# Patient Record
Sex: Male | Born: 1940
Health system: Southern US, Community
[De-identification: ages and names within clinical notes are randomized; demographics above are authoritative.]

## PROBLEM LIST (undated history)

## (undated) DIAGNOSIS — G4730 Sleep apnea, unspecified: Secondary | ICD-10-CM

## (undated) DIAGNOSIS — M1990 Unspecified osteoarthritis, unspecified site: Secondary | ICD-10-CM

## (undated) DIAGNOSIS — Z955 Presence of coronary angioplasty implant and graft: Secondary | ICD-10-CM

## (undated) DIAGNOSIS — F4321 Adjustment disorder with depressed mood: Secondary | ICD-10-CM

## (undated) DIAGNOSIS — E119 Type 2 diabetes mellitus without complications: Secondary | ICD-10-CM

## (undated) DIAGNOSIS — I509 Heart failure, unspecified: Secondary | ICD-10-CM

## (undated) DIAGNOSIS — I2510 Atherosclerotic heart disease of native coronary artery without angina pectoris: Secondary | ICD-10-CM

## (undated) DIAGNOSIS — I4891 Unspecified atrial fibrillation: Secondary | ICD-10-CM

## (undated) DIAGNOSIS — F17200 Nicotine dependence, unspecified, uncomplicated: Secondary | ICD-10-CM

## (undated) DIAGNOSIS — E785 Hyperlipidemia, unspecified: Secondary | ICD-10-CM

## (undated) DIAGNOSIS — T8859XA Other complications of anesthesia, initial encounter: Secondary | ICD-10-CM

## (undated) DIAGNOSIS — J449 Chronic obstructive pulmonary disease, unspecified: Secondary | ICD-10-CM

## (undated) DIAGNOSIS — I10 Essential (primary) hypertension: Secondary | ICD-10-CM

## (undated) DIAGNOSIS — T4145XA Adverse effect of unspecified anesthetic, initial encounter: Secondary | ICD-10-CM

## (undated) DIAGNOSIS — G4733 Obstructive sleep apnea (adult) (pediatric): Secondary | ICD-10-CM

## (undated) DIAGNOSIS — Z952 Presence of prosthetic heart valve: Secondary | ICD-10-CM

## (undated) DIAGNOSIS — F0390 Unspecified dementia without behavioral disturbance: Secondary | ICD-10-CM

## (undated) DIAGNOSIS — K219 Gastro-esophageal reflux disease without esophagitis: Secondary | ICD-10-CM

## (undated) HISTORY — PX: CORONARY ANGIOPLASTY WITH STENT PLACEMENT: SHX49

## (undated) HISTORY — DX: Atherosclerotic heart disease of native coronary artery without angina pectoris: I25.10

## (undated) HISTORY — DX: Heart failure, unspecified: I50.9

## (undated) HISTORY — DX: Nicotine dependence, unspecified, uncomplicated: F17.200

## (undated) HISTORY — DX: Gastro-esophageal reflux disease without esophagitis: K21.9

## (undated) HISTORY — DX: Sleep apnea, unspecified: G47.30

## (undated) HISTORY — DX: Essential (primary) hypertension: I10

## (undated) HISTORY — DX: Hyperlipidemia, unspecified: E78.5

## (undated) HISTORY — DX: Presence of prosthetic heart valve: Z95.2

## (undated) HISTORY — DX: Unspecified atrial fibrillation: I48.91

## (undated) HISTORY — DX: Obstructive sleep apnea (adult) (pediatric): G47.33

---

## 1998-04-05 ENCOUNTER — Encounter: Admission: RE | Admit: 1998-04-05 | Discharge: 1998-04-05 | Payer: Self-pay | Admitting: Family Medicine

## 1998-10-16 ENCOUNTER — Encounter: Admission: RE | Admit: 1998-10-16 | Discharge: 1998-10-16 | Payer: Self-pay | Admitting: Family Medicine

## 1998-11-04 ENCOUNTER — Encounter: Admission: RE | Admit: 1998-11-04 | Discharge: 1998-11-04 | Payer: Self-pay | Admitting: Family Medicine

## 1999-03-14 ENCOUNTER — Encounter: Admission: RE | Admit: 1999-03-14 | Discharge: 1999-03-14 | Payer: Self-pay | Admitting: Family Medicine

## 2000-03-29 ENCOUNTER — Encounter: Admission: RE | Admit: 2000-03-29 | Discharge: 2000-03-29 | Payer: Self-pay | Admitting: Sports Medicine

## 2000-11-16 ENCOUNTER — Encounter: Payer: Self-pay | Admitting: Emergency Medicine

## 2000-11-16 ENCOUNTER — Emergency Department (HOSPITAL_COMMUNITY): Admission: EM | Admit: 2000-11-16 | Discharge: 2000-11-16 | Payer: Self-pay | Admitting: Emergency Medicine

## 2001-03-30 ENCOUNTER — Encounter: Admission: RE | Admit: 2001-03-30 | Discharge: 2001-03-30 | Payer: Self-pay | Admitting: Family Medicine

## 2001-09-01 ENCOUNTER — Encounter: Admission: RE | Admit: 2001-09-01 | Discharge: 2001-09-01 | Payer: Self-pay | Admitting: Family Medicine

## 2002-03-14 ENCOUNTER — Encounter: Admission: RE | Admit: 2002-03-14 | Discharge: 2002-03-14 | Payer: Self-pay | Admitting: Family Medicine

## 2004-08-22 ENCOUNTER — Emergency Department (HOSPITAL_COMMUNITY): Admission: EM | Admit: 2004-08-22 | Discharge: 2004-08-23 | Payer: Self-pay | Admitting: Emergency Medicine

## 2004-08-26 ENCOUNTER — Ambulatory Visit: Payer: Self-pay | Admitting: Family Medicine

## 2004-10-09 ENCOUNTER — Ambulatory Visit: Payer: Self-pay | Admitting: Family Medicine

## 2004-10-09 ENCOUNTER — Ambulatory Visit (HOSPITAL_COMMUNITY): Admission: RE | Admit: 2004-10-09 | Discharge: 2004-10-09 | Payer: Self-pay | Admitting: Family Medicine

## 2004-10-16 ENCOUNTER — Ambulatory Visit (HOSPITAL_COMMUNITY): Admission: RE | Admit: 2004-10-16 | Discharge: 2004-10-16 | Payer: Self-pay | Admitting: Sports Medicine

## 2004-10-16 ENCOUNTER — Encounter (INDEPENDENT_AMBULATORY_CARE_PROVIDER_SITE_OTHER): Payer: Self-pay | Admitting: *Deleted

## 2004-10-23 ENCOUNTER — Ambulatory Visit: Payer: Self-pay | Admitting: Family Medicine

## 2005-02-19 ENCOUNTER — Ambulatory Visit: Payer: Self-pay | Admitting: Family Medicine

## 2005-03-03 ENCOUNTER — Ambulatory Visit: Payer: Self-pay | Admitting: Sports Medicine

## 2005-03-09 ENCOUNTER — Ambulatory Visit: Payer: Self-pay | Admitting: Family Medicine

## 2005-03-29 ENCOUNTER — Inpatient Hospital Stay (HOSPITAL_COMMUNITY): Admission: EM | Admit: 2005-03-29 | Discharge: 2005-04-01 | Payer: Self-pay | Admitting: Emergency Medicine

## 2005-03-29 ENCOUNTER — Ambulatory Visit: Payer: Self-pay | Admitting: Family Medicine

## 2005-04-06 ENCOUNTER — Ambulatory Visit: Payer: Self-pay | Admitting: Sports Medicine

## 2005-04-21 ENCOUNTER — Ambulatory Visit (HOSPITAL_COMMUNITY): Admission: RE | Admit: 2005-04-21 | Discharge: 2005-04-22 | Payer: Self-pay | Admitting: Cardiovascular Disease

## 2005-08-03 ENCOUNTER — Ambulatory Visit: Payer: Self-pay | Admitting: Sports Medicine

## 2005-10-23 ENCOUNTER — Ambulatory Visit: Payer: Self-pay | Admitting: Family Medicine

## 2005-11-18 ENCOUNTER — Ambulatory Visit: Payer: Self-pay | Admitting: Family Medicine

## 2006-02-24 ENCOUNTER — Emergency Department (HOSPITAL_COMMUNITY): Admission: EM | Admit: 2006-02-24 | Discharge: 2006-02-24 | Payer: Self-pay | Admitting: Emergency Medicine

## 2006-03-01 ENCOUNTER — Ambulatory Visit: Payer: Self-pay | Admitting: Family Medicine

## 2006-07-29 DIAGNOSIS — F1010 Alcohol abuse, uncomplicated: Secondary | ICD-10-CM | POA: Insufficient documentation

## 2006-07-29 DIAGNOSIS — E785 Hyperlipidemia, unspecified: Secondary | ICD-10-CM | POA: Insufficient documentation

## 2006-07-29 DIAGNOSIS — F17200 Nicotine dependence, unspecified, uncomplicated: Secondary | ICD-10-CM | POA: Insufficient documentation

## 2006-07-29 DIAGNOSIS — I359 Nonrheumatic aortic valve disorder, unspecified: Secondary | ICD-10-CM | POA: Insufficient documentation

## 2006-07-29 HISTORY — DX: Nicotine dependence, unspecified, uncomplicated: F17.200

## 2006-11-13 ENCOUNTER — Inpatient Hospital Stay (HOSPITAL_COMMUNITY): Admission: EM | Admit: 2006-11-13 | Discharge: 2006-11-16 | Payer: Self-pay | Admitting: Family Medicine

## 2006-11-13 ENCOUNTER — Ambulatory Visit: Payer: Self-pay | Admitting: Family Medicine

## 2006-11-16 ENCOUNTER — Encounter (INDEPENDENT_AMBULATORY_CARE_PROVIDER_SITE_OTHER): Payer: Self-pay | Admitting: Family Medicine

## 2006-11-16 ENCOUNTER — Encounter: Payer: Self-pay | Admitting: Family Medicine

## 2006-12-10 ENCOUNTER — Ambulatory Visit: Payer: Self-pay | Admitting: Family Medicine

## 2006-12-10 DIAGNOSIS — J449 Chronic obstructive pulmonary disease, unspecified: Secondary | ICD-10-CM | POA: Insufficient documentation

## 2006-12-15 DIAGNOSIS — F329 Major depressive disorder, single episode, unspecified: Secondary | ICD-10-CM | POA: Insufficient documentation

## 2006-12-15 DIAGNOSIS — K219 Gastro-esophageal reflux disease without esophagitis: Secondary | ICD-10-CM | POA: Insufficient documentation

## 2006-12-15 DIAGNOSIS — I2510 Atherosclerotic heart disease of native coronary artery without angina pectoris: Secondary | ICD-10-CM | POA: Insufficient documentation

## 2006-12-15 DIAGNOSIS — F3289 Other specified depressive episodes: Secondary | ICD-10-CM | POA: Insufficient documentation

## 2007-02-23 ENCOUNTER — Encounter (INDEPENDENT_AMBULATORY_CARE_PROVIDER_SITE_OTHER): Payer: Self-pay | Admitting: Family Medicine

## 2007-02-23 ENCOUNTER — Ambulatory Visit: Payer: Self-pay | Admitting: Family Medicine

## 2007-02-23 DIAGNOSIS — I509 Heart failure, unspecified: Secondary | ICD-10-CM | POA: Insufficient documentation

## 2007-02-23 DIAGNOSIS — I10 Essential (primary) hypertension: Secondary | ICD-10-CM | POA: Insufficient documentation

## 2007-02-23 HISTORY — DX: Heart failure, unspecified: I50.9

## 2007-06-08 ENCOUNTER — Encounter: Payer: Self-pay | Admitting: Family Medicine

## 2007-12-16 ENCOUNTER — Encounter: Payer: Self-pay | Admitting: *Deleted

## 2007-12-19 ENCOUNTER — Ambulatory Visit: Payer: Self-pay | Admitting: Family Medicine

## 2008-01-04 ENCOUNTER — Ambulatory Visit: Payer: Self-pay | Admitting: Family Medicine

## 2008-02-10 ENCOUNTER — Encounter: Payer: Self-pay | Admitting: Family Medicine

## 2008-03-05 ENCOUNTER — Ambulatory Visit: Payer: Self-pay | Admitting: Family Medicine

## 2008-03-15 ENCOUNTER — Ambulatory Visit: Payer: Self-pay | Admitting: Family Medicine

## 2008-03-15 ENCOUNTER — Encounter: Payer: Self-pay | Admitting: Family Medicine

## 2008-03-15 LAB — CONVERTED CEMR LAB
ALT: 16 units/L (ref 0–53)
AST: 19 units/L (ref 0–37)
Albumin: 4.7 g/dL (ref 3.5–5.2)
Alkaline Phosphatase: 64 units/L (ref 39–117)
BUN: 16 mg/dL (ref 6–23)
CO2: 25 meq/L (ref 19–32)
Calcium: 10.8 mg/dL — ABNORMAL HIGH (ref 8.4–10.5)
Chloride: 103 meq/L (ref 96–112)
Cholesterol, target level: 200 mg/dL
Cholesterol: 121 mg/dL (ref 0–200)
Creatinine, Ser: 1.1 mg/dL (ref 0.40–1.50)
Glucose, Bld: 93 mg/dL (ref 70–99)
HDL goal, serum: 40 mg/dL
HDL: 45 mg/dL (ref >39)
LDL Cholesterol: 63 mg/dL (ref 0–99)
LDL Goal: 100 mg/dL
PSA: 0.38 ng/mL (ref 0.10–4.00)
Potassium: 4.4 meq/L (ref 3.5–5.3)
Sodium: 137 meq/L (ref 135–145)
Total Bilirubin: 0.7 mg/dL (ref 0.3–1.2)
Total CHOL/HDL Ratio: 2.7
Total Protein: 7.8 g/dL (ref 6.0–8.3)
Triglycerides: 67 mg/dL (ref <150)
VLDL: 13 mg/dL (ref 0–40)

## 2008-03-20 ENCOUNTER — Emergency Department (HOSPITAL_COMMUNITY): Admission: EM | Admit: 2008-03-20 | Discharge: 2008-03-20 | Payer: Self-pay | Admitting: Emergency Medicine

## 2008-03-20 ENCOUNTER — Telehealth (INDEPENDENT_AMBULATORY_CARE_PROVIDER_SITE_OTHER): Payer: Self-pay | Admitting: *Deleted

## 2008-07-02 ENCOUNTER — Encounter: Payer: Self-pay | Admitting: Family Medicine

## 2008-07-09 ENCOUNTER — Ambulatory Visit: Payer: Self-pay | Admitting: Family Medicine

## 2008-07-09 ENCOUNTER — Emergency Department (HOSPITAL_COMMUNITY): Admission: EM | Admit: 2008-07-09 | Discharge: 2008-07-09 | Payer: Self-pay | Admitting: Emergency Medicine

## 2008-07-10 ENCOUNTER — Telehealth: Payer: Self-pay | Admitting: Family Medicine

## 2008-07-13 ENCOUNTER — Encounter: Payer: Self-pay | Admitting: Family Medicine

## 2008-08-03 ENCOUNTER — Telehealth: Payer: Self-pay | Admitting: Family Medicine

## 2008-08-13 ENCOUNTER — Telehealth: Payer: Self-pay | Admitting: Family Medicine

## 2009-04-29 ENCOUNTER — Telehealth: Payer: Self-pay | Admitting: Family Medicine

## 2009-05-27 ENCOUNTER — Ambulatory Visit: Payer: Self-pay | Admitting: Family Medicine

## 2009-05-27 ENCOUNTER — Encounter: Payer: Self-pay | Admitting: Family Medicine

## 2009-05-27 LAB — CONVERTED CEMR LAB

## 2009-05-29 LAB — CONVERTED CEMR LAB
ALT: 13 units/L (ref 0–53)
AST: 17 units/L (ref 0–37)
Albumin: 4.6 g/dL (ref 3.5–5.2)
Alkaline Phosphatase: 61 units/L (ref 39–117)
BUN: 14 mg/dL (ref 6–23)
CO2: 26 meq/L (ref 19–32)
Calcium: 11.2 mg/dL — ABNORMAL HIGH (ref 8.4–10.5)
Chloride: 102 meq/L (ref 96–112)
Cholesterol: 122 mg/dL (ref 0–200)
Creatinine, Ser: 1.18 mg/dL (ref 0.40–1.50)
Glucose, Bld: 98 mg/dL (ref 70–99)
HCT: 46.9 % (ref 39.0–52.0)
HDL: 40 mg/dL (ref >39)
Hemoglobin: 15.4 g/dL (ref 13.0–17.0)
LDL Cholesterol: 67 mg/dL (ref 0–99)
MCHC: 32.8 g/dL (ref 30.0–36.0)
MCV: 88.2 fL (ref 78.0–100.0)
PSA: 0.34 ng/mL (ref 0.10–4.00)
Platelets: 143 10*3/uL — ABNORMAL LOW (ref 150–400)
Potassium: 4.4 meq/L (ref 3.5–5.3)
RBC: 5.32 M/uL (ref 4.22–5.81)
RDW: 13.4 % (ref 11.5–15.5)
Sodium: 138 meq/L (ref 135–145)
Total Bilirubin: 0.6 mg/dL (ref 0.3–1.2)
Total CHOL/HDL Ratio: 3.1
Total Protein: 7.5 g/dL (ref 6.0–8.3)
Triglycerides: 75 mg/dL (ref <150)
VLDL: 15 mg/dL (ref 0–40)
WBC: 4.6 10*3/uL (ref 4.0–10.5)

## 2009-05-30 ENCOUNTER — Ambulatory Visit: Payer: Self-pay | Admitting: Family Medicine

## 2009-05-30 ENCOUNTER — Encounter: Payer: Self-pay | Admitting: Family Medicine

## 2009-06-01 ENCOUNTER — Encounter: Payer: Self-pay | Admitting: Family Medicine

## 2009-06-04 DIAGNOSIS — E210 Primary hyperparathyroidism: Secondary | ICD-10-CM | POA: Insufficient documentation

## 2009-06-04 LAB — CONVERTED CEMR LAB
Calcium Ionized: 1.44 mmol/L — ABNORMAL HIGH (ref 1.12–1.32)
Calcium, Total (PTH): 10.2 mg/dL (ref 8.4–10.5)
PTH: 117.2 pg/mL — ABNORMAL HIGH (ref 14.0–72.0)
Phosphorus: 2.5 mg/dL (ref 2.3–4.6)
TSH: 2.289 microintl units/mL (ref 0.350–4.500)
Vit D, 25-Hydroxy: <10 ng/mL — ABNORMAL LOW (ref 30–89)

## 2009-06-10 ENCOUNTER — Telehealth (INDEPENDENT_AMBULATORY_CARE_PROVIDER_SITE_OTHER): Payer: Self-pay | Admitting: *Deleted

## 2009-06-21 ENCOUNTER — Encounter: Payer: Self-pay | Admitting: Family Medicine

## 2010-02-21 ENCOUNTER — Ambulatory Visit: Payer: Self-pay | Admitting: Family Medicine

## 2010-02-21 DIAGNOSIS — E559 Vitamin D deficiency, unspecified: Secondary | ICD-10-CM | POA: Insufficient documentation

## 2010-02-21 LAB — CONVERTED CEMR LAB
ALT: 10 units/L (ref 0–53)
AST: 15 units/L (ref 0–37)
Albumin: 4.5 g/dL (ref 3.5–5.2)
Alkaline Phosphatase: 62 units/L (ref 39–117)
BUN: 9 mg/dL (ref 6–23)
CO2: 26 meq/L (ref 19–32)
Calcium, Total (PTH): 10.4 mg/dL (ref 8.4–10.5)
Calcium: 10.4 mg/dL (ref 8.4–10.5)
Chloride: 104 meq/L (ref 96–112)
Creatinine, Ser: 1.08 mg/dL (ref 0.40–1.50)
Glucose, Bld: 118 mg/dL — ABNORMAL HIGH (ref 70–99)
PTH: 132.9 pg/mL — ABNORMAL HIGH (ref 14.0–72.0)
Phosphorus: 2.6 mg/dL (ref 2.3–4.6)
Potassium: 3.8 meq/L (ref 3.5–5.3)
Sodium: 140 meq/L (ref 135–145)
Total Bilirubin: 0.6 mg/dL (ref 0.3–1.2)
Total Protein: 7.2 g/dL (ref 6.0–8.3)
Vit D, 25-Hydroxy: 17 ng/mL — ABNORMAL LOW (ref 30–89)

## 2010-02-28 ENCOUNTER — Encounter: Payer: Self-pay | Admitting: Family Medicine

## 2010-03-31 ENCOUNTER — Ambulatory Visit: Payer: Self-pay | Admitting: Family Medicine

## 2010-03-31 DIAGNOSIS — M549 Dorsalgia, unspecified: Secondary | ICD-10-CM | POA: Insufficient documentation

## 2010-03-31 DIAGNOSIS — K5900 Constipation, unspecified: Secondary | ICD-10-CM | POA: Insufficient documentation

## 2010-04-14 ENCOUNTER — Encounter: Payer: Self-pay | Admitting: Family Medicine

## 2010-05-04 ENCOUNTER — Emergency Department (HOSPITAL_COMMUNITY)
Admission: EM | Admit: 2010-05-04 | Discharge: 2010-05-04 | Payer: Self-pay | Source: Home / Self Care | Admitting: Family Medicine

## 2010-05-29 ENCOUNTER — Encounter: Payer: Self-pay | Admitting: Family Medicine

## 2010-06-01 DIAGNOSIS — I2510 Atherosclerotic heart disease of native coronary artery without angina pectoris: Secondary | ICD-10-CM

## 2010-06-01 HISTORY — DX: Atherosclerotic heart disease of native coronary artery without angina pectoris: I25.10

## 2010-06-01 HISTORY — PX: CARDIAC CATHETERIZATION: SHX172

## 2010-06-14 ENCOUNTER — Encounter: Payer: Self-pay | Admitting: Family Medicine

## 2010-06-14 ENCOUNTER — Emergency Department (HOSPITAL_COMMUNITY)
Admission: EM | Admit: 2010-06-14 | Discharge: 2010-06-14 | Payer: Self-pay | Source: Home / Self Care | Admitting: Emergency Medicine

## 2010-06-14 ENCOUNTER — Telehealth: Payer: Self-pay | Admitting: Sports Medicine

## 2010-06-14 DIAGNOSIS — H8110 Benign paroxysmal vertigo, unspecified ear: Secondary | ICD-10-CM | POA: Insufficient documentation

## 2010-06-14 DIAGNOSIS — R42 Dizziness and giddiness: Secondary | ICD-10-CM | POA: Insufficient documentation

## 2010-06-16 ENCOUNTER — Encounter: Payer: Self-pay | Admitting: Family Medicine

## 2010-06-16 LAB — URINALYSIS, ROUTINE W REFLEX MICROSCOPIC
Bilirubin Urine: NEGATIVE
Hgb urine dipstick: NEGATIVE
Ketones, ur: NEGATIVE mg/dL
Nitrite: NEGATIVE
Protein, ur: NEGATIVE mg/dL
Specific Gravity, Urine: 1.022 (ref 1.005–1.030)
Urine Glucose, Fasting: 100 mg/dL — AB
Urobilinogen, UA: 0.2 mg/dL (ref 0.0–1.0)
pH: 5 (ref 5.0–8.0)

## 2010-06-16 LAB — TROPONIN I: Troponin I: 0.01 ng/mL (ref 0.00–0.06)

## 2010-06-16 LAB — BASIC METABOLIC PANEL
BUN: 13 mg/dL (ref 6–23)
CO2: 25 mEq/L (ref 19–32)
Calcium: 10.7 mg/dL — ABNORMAL HIGH (ref 8.4–10.5)
Chloride: 104 mEq/L (ref 96–112)
Creatinine, Ser: 1.11 mg/dL (ref 0.4–1.5)
GFR calc Af Amer: >60 mL/min (ref >60)
GFR calc non Af Amer: >60 mL/min (ref >60)
Glucose, Bld: 169 mg/dL — ABNORMAL HIGH (ref 70–99)
Potassium: 4.7 mEq/L (ref 3.5–5.1)
Sodium: 136 mEq/L (ref 135–145)

## 2010-06-16 LAB — DIFFERENTIAL
Basophils Absolute: 0 10*3/uL (ref 0.0–0.1)
Basophils Relative: 0 % (ref 0–1)
Eosinophils Absolute: 0.1 10*3/uL (ref 0.0–0.7)
Eosinophils Relative: 1 % (ref 0–5)
Lymphocytes Relative: 15 % (ref 12–46)
Lymphs Abs: 1.1 10*3/uL (ref 0.7–4.0)
Monocytes Absolute: 0.3 10*3/uL (ref 0.1–1.0)
Monocytes Relative: 3 % (ref 3–12)
Neutro Abs: 6.4 10*3/uL (ref 1.7–7.7)
Neutrophils Relative %: 81 % — ABNORMAL HIGH (ref 43–77)

## 2010-06-16 LAB — CBC
HCT: 44.3 % (ref 39.0–52.0)
Hemoglobin: 14.1 g/dL (ref 13.0–17.0)
MCH: 28.3 pg (ref 26.0–34.0)
MCHC: 31.8 g/dL (ref 30.0–36.0)
MCV: 88.8 fL (ref 78.0–100.0)
Platelets: 129 10*3/uL — ABNORMAL LOW (ref 150–400)
RBC: 4.99 MIL/uL (ref 4.22–5.81)
RDW: 13.1 % (ref 11.5–15.5)
WBC: 7.9 10*3/uL (ref 4.0–10.5)

## 2010-06-16 LAB — CK TOTAL AND CKMB (NOT AT ARMC)
CK, MB: 2.6 ng/mL (ref 0.3–4.0)
Relative Index: 2.5 (ref 0.0–2.5)
Total CK: 104 U/L (ref 7–232)

## 2010-06-26 ENCOUNTER — Encounter (INDEPENDENT_AMBULATORY_CARE_PROVIDER_SITE_OTHER): Payer: Self-pay | Admitting: *Deleted

## 2010-06-27 ENCOUNTER — Ambulatory Visit (HOSPITAL_COMMUNITY)
Admission: RE | Admit: 2010-06-27 | Discharge: 2010-06-27 | Payer: Self-pay | Source: Home / Self Care | Attending: Cardiovascular Disease | Admitting: Cardiovascular Disease

## 2010-06-30 ENCOUNTER — Ambulatory Visit (HOSPITAL_COMMUNITY)
Admission: RE | Admit: 2010-06-30 | Discharge: 2010-06-30 | Payer: Self-pay | Source: Home / Self Care | Attending: Cardiovascular Disease | Admitting: Cardiovascular Disease

## 2010-07-02 HISTORY — PX: CORONARY ARTERY BYPASS GRAFT: SHX141

## 2010-07-02 HISTORY — PX: AORTIC VALVE REPLACEMENT: SHX41

## 2010-07-03 LAB — POCT I-STAT 3, ART BLOOD GAS (G3+)
Bicarbonate: 25.8 mEq/L — ABNORMAL HIGH (ref 20.0–24.0)
O2 Saturation: 93 %
TCO2: 27 mmol/L (ref 0–100)
pCO2 arterial: 43 mmHg (ref 35.0–45.0)
pH, Arterial: 7.386 (ref 7.350–7.450)
pO2, Arterial: 67 mmHg — ABNORMAL LOW (ref 80.0–100.0)

## 2010-07-03 LAB — POCT I-STAT 3, VENOUS BLOOD GAS (G3P V)
Bicarbonate: 25.8 mEq/L — ABNORMAL HIGH (ref 20.0–24.0)
O2 Saturation: 68 %
TCO2: 27 mmol/L (ref 0–100)
pCO2, Ven: 45.5 mmHg (ref 45.0–50.0)
pH, Ven: 7.361 — ABNORMAL HIGH (ref 7.250–7.300)
pO2, Ven: 37 mmHg (ref 30.0–45.0)

## 2010-07-03 NOTE — Progress Notes (Signed)
Summary: EMERGENCY LINE CALL  Phone Note Call from Patient Call back at Home Phone [phone]   Caller: Daughter Summary of Call: Calling re: rapid onset of N/V/spinning room sensation.  Noted that yesterday around 8pm he had some spaghetti and meatballs, then an hour or so ago started having the sensation of the room spinning.  He has to sit up straight, if he tries to turn his head or lay down then spinning sensation comes back and he gets sick. If stays still no problems.  Vomitus is NB/NB.  They were concerned  and wondering if these were the symptoms of an MI or stroke.  Noted he does have CAD but current symptoms are non-exertional, he did not take NTG for them, no CP or SOB.  No unilateral nuumbnes or weakness, face symmetric, no speech changes.  No fevers/chills, no head trauma.  No hearing changes, no visual changes other than when tries to lay flat.    Advised that symptoms suggestive of benign positional vertigo.  Can get OTC meclizine 25mg , should get "Zentrip" form which is OTC and dissolvable.  May take q6h for nausea/vertigo.  If persistent and unable to take by mouth fluids then should go to [facility] in the AM.  Otherwise make SDA at [facility] Monday.  Daughter and patient appreciable. Initial call taken by: Aundria Mems MD,  June 14, 2010 6:23 AM

## 2010-07-03 NOTE — Assessment & Plan Note (Signed)
Summary: abdominal pain wp   Vital Signs:  Patient Profile:   70 Years Old Male Weight:      216 pounds Temp:     216 degrees F Pulse rate:   59 / minute BP sitting:   134 / 71  Pt. in pain?   no  Vitals Entered By: Christen Bame CMA (December 19, 2007 8:39 AM)                  Chief Complaint:  Abdominal Pain.  History of Present Illness: 70 yo man w/ multiple medical issues who RTC today for abdominal pain.  Pain has been intermittant for a few weeks and feels like a 'gas pressure' pain.  Pt's pain is relieved when he has BM.  Pt takes Milk of Mag and stool softener to assist w/ BMs, w/out these meds pt only has BM weekly.  States w/out meds he has to strain to have BM.  Per report drinks prune juice and 'lots' of water but unable to quantify how much.  Denies N/V, BRBPR, melena, heartburn, CP, SOB.    Current Allergies: No known allergies      Review of Systems      See HPI   Physical Exam  General:     alert and cooperative to examination.   Lungs:     Normal respiratory effort, chest expands symmetrically. Lungs are clear to auscultation, no crackles or wheezes. Heart:     normal rate, regular rhythm, no gallop, no rub, and no JVD.  very prominent 2/6 SEM heard throughout precordium Abdomen:     Bowel sounds positive,abdomen soft and non-tender without masses, organomegaly or hernias noted.    Impression & Recommendations:  Problem # 1:  ABDOMINAL PAIN (ICD-789.00) Assessment: New Pt's abdominal pain seems consistent w/ constipation as his pain is relieved by defecation.  No other red flags on history or exam.  Will start pt on Miralax twice daily until regular BMs and then daily.  Also encouraged at least 36 oz of water daily and increased fiber intake.  Reviewed sources of fiber w/ pt.  Pt will f/u w/ PCP in 2-3 weeks to assess for progress.  Pt expresses understanding and is in agreement w/ this plan. Orders: [facility]- Est Level  3 [id number])   Complete  Medication List: 1)  Hydrochlorothiazide 25 Mg Tabs (Hydrochlorothiazide) .... Take 1 tablet by mouth once a day 2)  Lipitor 20 Mg Tabs (Atorvastatin calcium) .Marland Kitchen.. 1 tablet by mouth once a day 3)  Nitroglycerin 0.4 Mg Subl (Nitroglycerin) .... Place 1 tablet under tongue as directed 4)  Plavix 75 Mg Tabs (Clopidogrel bisulfate) .... Take 1 tablet by mouth once a day 5)  Toprol Xl 25 Mg Tb24 (Metoprolol succinate) .... Take 1 tablet by mouth once a day 6)  Accupril 10 Mg Tabs (Quinapril hcl) 7)  Symbicort 80-4.5 Mcg/act Aero (Budesonide-formoterol fumarate) 8)  Miralax Powd (Polyethylene glycol 3350) .... Dissolve 1 capful (17 grams) in 8 oz of water two times a day.  disp 1 large bottle   Patient Instructions: 1)  Follow up in 2-3 weeks w/ Dr Deborra Medina 2)  Take the Miralax twice a day until regular bowel movements and then take daily 3)  Drink lots of water!  Eat lots of fiber! 4)  Call for another appointment if you have vomiting, severe abdominal pain, fever, or other concerns!   Prescriptions: HYDROCHLOROTHIAZIDE 25 MG TABS (HYDROCHLOROTHIAZIDE) Take 1 tablet by mouth once a day  #31  x 6   Entered and Authorized by:   Annye Asa  MD   Signed by:   Annye Asa  MD on 12/19/2007   Method used:   Print then Give to Patient   [id number] MIRALAX   POWD (POLYETHYLENE GLYCOL 3350) dissolve 1 capful (17 grams) in 8 oz of water two times a day.  Disp 1 large bottle  #1 x 3   Entered and Authorized by:   Annye Asa  MD   Signed by:   Annye Asa  MD on 12/19/2007   Method used:   Print then Give to Patient   [id number]  ]

## 2010-07-03 NOTE — Progress Notes (Signed)
Summary: refill  Phone Note Refill Request Call back at [phone] Message from:  Patient  Refills Requested: Medication #1:  ACCUPRIL 10 MG TABS 1 tab by mouth bid [facility]  Initial call taken by: Audie Clear,  August 13, 2008 1:36 PM  Follow-up for Phone Call        will forward message to MD. Follow-up by: Marcell Barlow RN,  August 13, 2008 1:58 PM      Prescriptions: ACCUPRIL 10 MG TABS (QUINAPRIL HCL) 1 tab by mouth bid  #60 x 3   Entered and Authorized by:   Arnette Norris MD   Signed by:   Arnette Norris MD on 08/14/2008   Method used:   Faxed to ...       [facility] Department (retail)       [address] [place], [place]  [address]       Ph: [phone]       Fax: [phone]   RxID:   [id number]

## 2010-07-03 NOTE — Assessment & Plan Note (Signed)
Summary: f/u meds,df   Vital Signs:  Patient profile:   70 year old male Weight:      219.8 pounds BMI:     31.88 Temp:     98.0143 degrees F Pulse rate:   63 / minute BP sitting:   143 / 76  (left arm)  Vitals Entered By: Geanie Cooley RN (May 27, 2009 11:54 AM) CC: f/u  HTN, HLD, COPD, heartburn Is Patient Diabetic? No Pain Assessment Patient in pain? no        Immunization History:  Influenza Immunization History:    Influenza:  historical (03/01/2009)   Primary Care Provider:  Patria Mane  MD  CC:  f/u  HTN, HLD, COPD, and heartburn.  History of Present Illness: HTN: meds brought in and reviewed today. taking meds as prescribed.  checking his BP occasionally at home and he states it is variable at home.  denies any CP, SOB, peripheral edema, dizziness.  sometimes he does state he feels fatigued after taking meds.  due for check of kidney fxn.   HLD: taking lipitor as prescribed.  denies muscle aches or weakness. is fasting today for recheck.    COPD: taking symbicort.  thinks it really helps.  was off of it for a little when there was a backorder at the pharmacy and he really noticed is breathing was worse when off of it.  today states breathing feels fine, not SOB.   heartburn: having to take tums fairly regularly and they aren't always helping.  occurs mostly after eating.  doesn't feel like previous heart chest pain.  denies vomiting, denies black or bloody stools.    Habits & Providers  Alcohol-Tobacco-Diet     Tobacco Status: quit     Year Quit: 2009  Current Medications (verified): 1)  Lipitor 20 Mg Tabs (Atorvastatin Calcium) .Marland Kitchen.. 1 Tablet By Mouth Once A Day 2)  Nitroglycerin 0.4 Mg Subl (Nitroglycerin) .... Place 1 Tablet Under Tongue As Directed 3)  Plavix 75 Mg Tabs (Clopidogrel Bisulfate) .... Take 1 Tablet By Mouth Once A Day 4)  Toprol Xl 25 Mg Tb24 (Metoprolol Succinate) .... Take 1 Tablet By Mouth Once A Day 5)   Quinapril-Hydrochlorothiazide 20-25 Mg Tabs (Quinapril-Hydrochlorothiazide) .Marland Kitchen.. 1 By Mouth Once Daily For Blood Pressure (Replaces Old Hctz and Accupril) 6)  Symbicort 80-4.5 Mcg/act  Aero (Budesonide-Formoterol Fumarate) .Marland Kitchen.. 1 Puff Inhaled Two Times A Day.  Last Fills Before Visit With Doctor 7)  Miralax   Powd (Polyethylene Glycol 3350) .... Dissolve 1 Capful (17 Grams) in 8 Oz of Water 1-2 Times Daily As Needed For Constipation.  Disp 1 Large Bottle 8)  Ranitidine Hcl 150 Mg Caps (Ranitidine Hcl) .Marland Kitchen.. 1 By Mouth 1-2 Times Daily For Heartburn  Allergies (verified): 1)  ! Pineapple Concentrate (Flavoring Agent)  Past History:  Past medical, surgical, family and social histories (including risk factors) reviewed for relevance to current acute and chronic problems.  Past Medical History: Reviewed history from 12/10/2006 and no changes required. -Needs SBE prophylaxis -AS -Coronary artery disease -Hyperlipidemia: FLP on 07/14: cholesterol 110, trig: 42, HDL: 35, LDL:67.  -Hypertension   -COPD -Depression -GERD  Past Surgical History: Reviewed history from 12/10/2006 and no changes required. -2D echo- LVEF 65-75%, mild-mod AS - 10/23/2004,  -Cath-75% block in both LCx and LAD - 03/01/2005  -MId and distal circumflex and mid left anterior descending stenting c/ CYPHER drug eluting stents and Angiomax anticoagulation. By Dr. Gwenlyn Found - 04/21/2005  -Echo - EF wnl, mod LVH,  Severe AS,  mild MR, TR, Mild AR, impaired LV  relaxation -  10/23/2005  -Cath performed on 11/15/06 showed: 1- Multivessel artery disease with patent stents in LAD and circumflex  2-Nwe lesion in prximal circumflex obtuse marginal branch of 95 %  3- AS : transvalvular gradient of 47 mm, 5-EF 75-85 % , hypertrophic profile of LV.  -Stenting of the proximal obtuse marginal branch #1 with a non drug coated stent was performed by Dr. Myrtice Lauth on 0000000 without complications.Plavix and aspirin were started. Pt has  f/o with Dr.   Marland KitchenECHO 07/08: EF 75 % (hyperdinamic), Dynamic LV outflow obstruction w/peak velocity 38m/sec w/peak gradient of 16 mmHg. Study inadecuate to r/o bicuspid aortic valve. Aortic valve 1/2 to mod calcified -->consistent with moderate AS. Mean transaortic valve gradient 35 mmHg. Aortic valve area 0.95 cm2.  Family History: Reviewed history from 07/29/2006 and no changes required. dad died 27s - murdered, mom-died89 - heart disease  Social History: Reviewed history from 07/29/2006 and no changes required. Separated from wife for about 20 years (don't divorce 2/2 financial reasons) Smoked apporx 1 pack per day, but quit in 2009.  H/O  Etoh abuse, stopped drinking in 2/06. Used to Work at [employer] prior to diagnosis with heart disease  Review of Systems       per HPI.  does note some occasional constipation.  denies abdominal pain.    Physical Exam  General:  alert and cooperative to examination.   Lungs:  Normal respiratory effort, chest expands symmetrically. lungs with occasional rhonchi.  Heart:  normal rate, regular rhythm, no gallop, no rub, and no JVD.  harsh 2/6 SEM heard throughout precordium that radiates to carotids bilaterally Abdomen:  Bowel sounds positive,abdomen soft and non-tender without masses, organomegaly or hernias noted. Extremities:  no edema.  2+ pulses in all.   Neurologic:  alert & oriented X3.     Impression & Recommendations:  Problem # 1:  HYPERTENSION, ESSENTIAL NOS (ICD-401.9) Assessment Unchanged improved.  for convenience will combine his ACEI and HCTZ.  continue home monitoring.  check kidney fxn today.   The following medications were removed from the medication list:    Hydrochlorothiazide 25 Mg Tabs (Hydrochlorothiazide) .Marland Kitchen... Take 1 tablet by mouth once a day His updated medication list for this problem includes:    Toprol Xl 25 Mg Tb24 (Metoprolol succinate) .Marland Kitchen... Take 1 tablet by mouth once a day    Quinapril-hydrochlorothiazide 20-25  Mg Tabs (Quinapril-hydrochlorothiazide) .Marland Kitchen... 1 by mouth once daily for blood pressure (replaces old hctz and accupril)  Orders: Comp Met-[facility] [id number]) [facility]- Est  Level 4 [id number])  Problem # 2:  HYPERLIPIDEMIA (ICD-272.4) Assessment: Unchanged check LFTS, lipids today.  continue statin.  well tolerated.  previously at goal.  His updated medication list for this problem includes:    Lipitor 20 Mg Tabs (Atorvastatin calcium) .Marland Kitchen... 1 tablet by mouth once a day  Orders: Lipid-[facility] [id number]) [place]- Est  Level 4 [id number])  Problem # 3:  COPD (ICD-496) Assessment: Unchanged continue symbicort as before.     His updated medication list for this problem includes:    Symbicort 80-4.5 Mcg/act Aero (Budesonide-formoterol fumarate) .Marland Kitchen... 1 puff inhaled two times a day.  last fills before visit with doctor  Orders: [facility]- Est  Level 4 [id number])  Problem # 4:  GERD (ICD-530.81) Assessment: Deteriorated add H2 blocker for attempt at relief.  check cbc.  given he is on plavix no PPI for now  His updated medication list for  this problem includes:    Ranitidine Hcl 150 Mg Caps (Ranitidine hcl) .Marland Kitchen... 1 by mouth 1-2 times daily for heartburn  Orders: CBC-[facility] [id number]) [facility]- Est  Level 4 [id number])  Complete Medication List: 1)  Lipitor 20 Mg Tabs (Atorvastatin calcium) .Marland Kitchen.. 1 tablet by mouth once a day 2)  Nitroglycerin 0.4 Mg Subl (Nitroglycerin) .... Place 1 tablet under tongue as directed 3)  Plavix 75 Mg Tabs (Clopidogrel bisulfate) .... Take 1 tablet by mouth once a day 4)  Toprol Xl 25 Mg Tb24 (Metoprolol succinate) .... Take 1 tablet by mouth once a day 5)  Quinapril-hydrochlorothiazide 20-25 Mg Tabs (Quinapril-hydrochlorothiazide) .Marland Kitchen.. 1 by mouth once daily for blood pressure (replaces old hctz and accupril) 6)  Symbicort 80-4.5 Mcg/act Aero (Budesonide-formoterol fumarate) .Marland Kitchen.. 1 puff inhaled two times a day.  last fills before visit with doctor 7)  Miralax Powd (Polyethylene glycol 3350)  .... Dissolve 1 capful (17 grams) in 8 oz of water 1-2 times daily as needed for constipation.  disp 1 large bottle 8)  Ranitidine Hcl 150 Mg Caps (Ranitidine hcl) .Marland Kitchen.. 1 by mouth 1-2 times daily for heartburn 9)  Aspirin 81 Mg Tbec (Aspirin) .... Daily  Other Orders: PSA (Medicare)-[facility] [id number])  Patient Instructions: 1)  Please follow up with me in about 3 months to see how your blood pressure is doing. 2)  I will send you a letter with the results of your blood tests today.  3)  It was nice to meet you. Prescriptions: MIRALAX   POWD (POLYETHYLENE GLYCOL 3350) dissolve 1 capful (17 grams) in 8 oz of water 1-2 times daily as needed for constipation.  Disp 1 large bottle  #1 x 6   Entered and Authorized by:   Patria Mane  MD   Signed by:   Patria Mane  MD on 05/27/2009   Method used:   Print then Give to Patient   RxID:   [id number] SYMBICORT 80-4.5 MCG/ACT  AERO (BUDESONIDE-FORMOTEROL FUMARATE) 1 puff inhaled two times a day.  last fills before visit with doctor  #3 x 4   Entered and Authorized by:   Patria Mane  MD   Signed by:   Patria Mane  MD on 05/27/2009   Method used:   Print then Give to Patient   RxID:   [id number] TOPROL XL 25 MG TB24 (METOPROLOL SUCCINATE) Take 1 tablet by mouth once a day  #31 x 6   Entered and Authorized by:   Patria Mane  MD   Signed by:   Patria Mane  MD on 05/27/2009   Method used:   Print then Give to Patient   RxID:   [id number] PLAVIX 75 MG TABS (CLOPIDOGREL BISULFATE) Take 1 tablet by mouth once a day  #31 x 6   Entered and Authorized by:   Patria Mane  MD   Signed by:   Patria Mane  MD on 05/27/2009   Method used:   Print then Give to Patient   RxIDDT:[id number] NITROGLYCERIN 0.4 MG SUBL (NITROGLYCERIN) Place 1 tablet under tongue as directed  #25 x 6   Entered and Authorized by:   Patria Mane  MD   Signed by:   Patria Mane  MD on 05/27/2009   Method used:   Print then Give to Patient   RxID:    [id number] LIPITOR 20 MG TABS (ATORVASTATIN CALCIUM) 1 tablet by mouth once a day  #90 x 1   Entered and Authorized by:  Patria Mane  MD   Signed by:   Patria Mane  MD on 05/27/2009   Method used:   Print then Give to Patient   RxID:   [id number] QUINAPRIL-HYDROCHLOROTHIAZIDE 20-25 MG TABS (QUINAPRIL-HYDROCHLOROTHIAZIDE) 1 by mouth once daily for blood pressure (replaces old HCTZ and accupril)  #31 x 6   Entered and Authorized by:   Patria Mane  MD   Signed by:   Patria Mane  MD on 05/27/2009   Method used:   Print then Give to Patient   RxID:   [id number] RANITIDINE HCL 150 MG CAPS (RANITIDINE HCL) 1 by mouth 1-2 times daily for heartburn  #60 x 3   Entered and Authorized by:   Patria Mane  MD   Signed by:   Patria Mane  MD on 05/27/2009   Method used:   Print then Give to Patient   RxID:   [id number]   Prevention & Chronic Care Immunizations   Influenza vaccine: Historical  (03/01/2009)   Influenza vaccine due: 03/07/2009    Tetanus booster: Not documented    Pneumococcal vaccine: Not documented    H. zoster vaccine: Not documented  Colorectal Screening   Hemoccult: Not documented   Hemoccult due: Not Indicated    Colonoscopy: abnormal  (02/10/2008)   Colonoscopy due: 02/09/2013  Other Screening   PSA: 0.38  (03/15/2008)   PSA action/deferral: Discussed-PSA requested  (05/27/2009)   PSA due due: 03/15/2009   Smoking status: quit  (05/27/2009)  Lipids   Total Cholesterol: 121  (03/15/2008)   LDL: 63  (03/15/2008)   LDL Direct: Not documented   HDL: 45  (03/15/2008)   Triglycerides: 67  (03/15/2008)    SGOT (AST): 19  (03/15/2008)   SGPT (ALT): 16  (03/15/2008) CMP ordered    Alkaline phosphatase: 64  (03/15/2008)   Total bilirubin: 0.7  (03/15/2008)    Lipid flowsheet reviewed?: Yes   Progress toward LDL goal: At goal  Hypertension   Last Blood Pressure: 143 / 76  (05/27/2009)   Serum creatinine: 1.10  (03/15/2008)    Serum potassium 4.4  (03/15/2008) CMP ordered     Hypertension flowsheet reviewed?: Yes   Progress toward BP goal: Improved  Self-Management Support :   Personal Goals (by the next clinic visit) :      Personal blood pressure goal: 140/90  (05/27/2009)     Personal LDL goal: 100  (05/27/2009)    Hypertension self-management support: Not documented    Hypertension self-management support not done because: Good outcomes  (05/27/2009)    Lipid self-management support: Not documented     Lipid self-management support not done because: Good outcomes  (05/27/2009)

## 2010-07-03 NOTE — Miscellaneous (Signed)
Summary: PTH  Clinical Lists Changes Will monitor pts. calcium periodically.  If levels go up--repeat PTH and consider further studies.

## 2010-07-03 NOTE — Miscellaneous (Signed)
Summary: handicap placard app  Clinical Lists Changes Please put in physician's box for completion.  Pt will pick up when ready Eusebio Friendly  May 30, 2009 10:26 AM  form to pcp.Elige Radon RN  May 30, 2009 10:47 AM   completed. Patria Mane  MD  May 30, 2009 6:51 PM  Appended Document: handicap placard app placed in front office pick-up box. ERIN ODELL 06/03/09 9:05 AM

## 2010-07-03 NOTE — Consult Note (Signed)
Summary: [facility] ECHO - normal EF, mod/severe AS  [place] By: Raymond Gurney 06/24/2009 16:45:06  _____________________________________________________________________  External Attachment:    Type:   Image     Comment:   External Document

## 2010-07-03 NOTE — Miscellaneous (Signed)
Summary: refill request  Clinical Lists Changes  received refill request from [facility]. for Symbicort. will forward to preceptor. last  RX  given 05/27/2009. Marcell Barlow RN  May 29, 2010 12:02 PM   Medications: Rx of SYMBICORT 80-4.5 MCG/ACT  AERO (BUDESONIDE-FORMOTEROL FUMARATE) 1 puff inhaled two times a day.  last fills before visit with doctor;  #3 x 4;  Signed;  Entered by: Dorcas Mcmurray MD;  Authorized by: Dorcas Mcmurray MD;  Method used: Printed then faxed to  Rx of SYMBICORT 80-4.5 MCG/ACT  AERO (BUDESONIDE-FORMOTEROL FUMARATE) 1 puff inhaled two times a day.  last fills before visit with doctor;  #3 x 4;  Signed;  Entered by: Dorcas Mcmurray MD;  Authorized by: Dorcas Mcmurray MD;  Method used: Printed then faxed to    Prescriptions: SYMBICORT 80-4.5 MCG/ACT  AERO (BUDESONIDE-FORMOTEROL FUMARATE) 1 puff inhaled two times a day.  last fills before visit with doctor  #3 x 4   Entered and Authorized by:   Dorcas Mcmurray MD   Signed by:   Dorcas Mcmurray MD on 05/30/2010   Method used:   Printed then faxed to ...         [id number] SYMBICORT 80-4.5 MCG/ACT  AERO (BUDESONIDE-FORMOTEROL FUMARATE) 1 puff inhaled two times a day.  last fills before visit with doctor  #3 x 4   Entered and Authorized by:   Dorcas Mcmurray MD   Signed by:   Dorcas Mcmurray MD on 05/30/2010   Method used:   Printed then faxed to ...         [id number]  will fax [facility]

## 2010-07-03 NOTE — Miscellaneous (Signed)
  Clinical Lists Changes  Medications: Removed medication of RANITIDINE HCL 150 MG CAPS (RANITIDINE HCL) 1 by mouth 1-2 times daily for heartburn Added new medication of PEPCID 20 MG TABS (FAMOTIDINE) 1 by mouth two times a day - Signed Rx of PEPCID 20 MG TABS (FAMOTIDINE) 1 by mouth two times a day;  #60 x 5;  Signed;  Entered by: Darron Doom MD;  Authorized by: Darron Doom MD;  Method used: Print then Give to Patient    Prescriptions: PEPCID 20 MG TABS (FAMOTIDINE) 1 by mouth two times a day  #60 x 5   Entered and Authorized by:   Darron Doom MD   Signed by:   Darron Doom MD on 02/28/2010   Method used:   Print then Give to Patient   RxID:   [id number]   Appended Document:  Faxed to [facility].  Unable to contact pt to inform

## 2010-07-03 NOTE — Consult Note (Signed)
Summary: [facility]   Imported By: Audie Clear 07/05/2008 14:47:25  _____________________________________________________________________  External Attachment:    Type:   Image     Comment:   External Document

## 2010-07-03 NOTE — Assessment & Plan Note (Signed)
Summary: FU/KH  Medications Added ACCUPRIL 20 MG  TABS (QUINAPRIL HCL) 1 tab by mouth daily HYDROCHLOROTHIAZIDE 25 MG TABS (HYDROCHLOROTHIAZIDE) Take 1 tablet by mouth once a day LIPITOR 20 MG TABS (ATORVASTATIN CALCIUM) 1 tablet by mouth once a day NITROGLYCERIN 0.4 MG SUBL (NITROGLYCERIN) Place 1 tablet under tongue as directed PLAVIX 75 MG TABS (CLOPIDOGREL BISULFATE) Take 1 tablet by mouth once a day TOPROL XL 25 MG TB24 (METOPROLOL SUCCINATE) Take 1 tablet by mouth once a day      Allergies Added: NKDA  Vital Signs:  Patient Profile:   70 Years Old Male Weight:      205.8 pounds Temp:     98.5 degrees F Pulse rate:   62 / minute BP sitting:   152 / 73  (left arm)  Pt. in pain?   no  Vitals Entered By: Arnette Schaumann RN (February 23, 2007 10:12 AM)                  Chief Complaint:  f/u htn and and wants flu shot.  Hypertension History:      He denies headache, chest pain, palpitations, dyspnea with exertion, orthopnea, PND, peripheral edema, visual symptoms, neurologic problems, syncope, and side effects from treatment.  Further comments include: not compliant with low sodium diet.        Positive major cardiovascular risk factors include male age 2 years old or older, hyperlipidemia, hypertension, and current tobacco user.  Negative major cardiovascular risk factors include no history of diabetes and negative family history for ischemic heart disease.        Positive history for target organ damage include ASHD (either angina; prior MI; prior CABG) and cardiac end organ damage (either CHF or LVH).  Further assessment for target organ damage reveals no history of stroke/TIA.     Current Allergies: No known allergies     Risk Factors:  Tobacco use:  current    Cigarettes:  Yes -- 4 cigarettes per week pack(s) per day  Family History Risk Factors:    Family History of MI in females < 80 years old:  no    Family History of MI in males < 50 years old:  no      Complete Medication List: 1)  Accupril 20 Mg Tabs (Quinapril hcl) .Marland Kitchen.. 1 tab by mouth daily 2)  Hydrochlorothiazide 25 Mg Tabs (Hydrochlorothiazide) .... Take 1 tablet by mouth once a day 3)  Lipitor 20 Mg Tabs (Atorvastatin calcium) .Marland Kitchen.. 1 tablet by mouth once a day 4)  Nitroglycerin 0.4 Mg Subl (Nitroglycerin) .... Place 1 tablet under tongue as directed 5)  Plavix 75 Mg Tabs (Clopidogrel bisulfate) .... Take 1 tablet by mouth once a day 6)  Toprol Xl 25 Mg Tb24 (Metoprolol succinate) .... Take 1 tablet by mouth once a day  Hypertension Assessment/Plan:      The patient's hypertensive risk group is category C: Target organ damage and/or diabetes.  Today's blood pressure is 152/73.  His blood pressure goal is < 140/90.     Prescriptions: TOPROL XL 25 MG TB24 (METOPROLOL SUCCINATE) Take 1 tablet by mouth once a day  #31 x 6   Entered and Authorized by:   Thornton Dales MD   Signed by:   Thornton Dales MD on 02/23/2007   Method used:   Print then Give to Patient   [id number] PLAVIX 75 MG TABS (CLOPIDOGREL BISULFATE) Take 1 tablet by mouth once a day  #31 x 6  Entered and Authorized by:   Thornton Dales MD   Signed by:   Thornton Dales MD on 02/23/2007   Method used:   Print then Give to Patient   [id number] NITROGLYCERIN 0.4 MG SUBL (NITROGLYCERIN) Place 1 tablet under tongue as directed  #25 x 6   Entered and Authorized by:   Thornton Dales MD   Signed by:   Thornton Dales MD on 02/23/2007   Method used:   Print then Give to Patient   [id number] LIPITOR 20 MG TABS (ATORVASTATIN CALCIUM) 1 tablet by mouth once a day  #31 x 6   Entered and Authorized by:   Thornton Dales MD   Signed by:   Thornton Dales MD on 02/23/2007   Method used:   Print then Give to Patient   [id number] HYDROCHLOROTHIAZIDE 25 MG TABS (HYDROCHLOROTHIAZIDE) Take 1 tablet by mouth once a day  #31 x 6   Entered and Authorized by:   Thornton Dales MD   Signed by:   Thornton Dales MD on 02/23/2007   Method used:   Print then Give to Patient   [id number] ACCUPRIL 20 MG  TABS (QUINAPRIL HCL) 1 tab by mouth daily  #30 x 6   Entered and Authorized by:   Thornton Dales MD   Signed by:   Thornton Dales MD on 02/23/2007   Method used:   Print then Give to Patient   [id number]  ]

## 2010-07-03 NOTE — Progress Notes (Signed)
Summary: Lab Res   Phone Note Call from Patient Call back at [phone]   Caller: Patient Complaint: Headache Summary of Call: Pt calling to check on his blood work. Initial call taken by: Raymond Gurney,  June 10, 2009 4:45 PM  Follow-up for Phone Call        will forward message to Dr. Andria Frames to please advise in Dr. Benay Pillow absence. RN will be glad to call patient back. Follow-up by: Marcell Barlow RN,  June 10, 2009 4:58 PM  Additional Follow-up for Phone Call Additional follow up Details #1::        please let him know from note on 06/04/09 Additional Follow-up by: Patria Mane  MD,  June 11, 2009 6:22 AM    Additional Follow-up for Phone Call Additional follow up Details #2::    patient notified and RX called to pharmacy. Follow-up by: Marcell Barlow RN,  June 11, 2009 10:17 AM

## 2010-07-03 NOTE — Assessment & Plan Note (Signed)
Summary: hospital fu wp   Vital Signs:  Patient Profile:   70 Years Old Male Weight:      206.9 pounds Pulse rate:   92 / minute BP sitting:   133 / 72  (left arm)  Pt. in pain?   no  Vitals Entered By: Mauricia Area CMA, (December 10, 2006 2:59 PM)                Chief Complaint:  hospital f/up...refill HCTZ.  History of Present Illness: 70 yo AAM w/ hx of htn, hyperlipidemia, COPD, CAD,  AS was admitted at [facility] from 11/13/06 to 11/16/06.  1-Pt was c/o atypical CP. Because he has significant CAD and has stents placed on 11/06, cardiology was consulted. A Cath performed on 11/15/06 showed:1- Multivessel artery disease with patent stents in LAD and circumflex ,2-Nwe lesion in prximal circumflex obtuse marginal branch of 95 %, 3- AS : transvalvular gradient of 47 mm, 5-EF 75-85 % , hypertrophic profile of LV. Stenting of the proximal obtuse marginal branch #1 with a non drug coated stent was performed by Dr. Myrtice Lauth on 0000000 without complications.Plavix and aspirin were started. Pt has f/o with Dr. Gwenlyn Found  last week. Pt was told that he is stable and needs to f/u in 6 months. 2-As per AS: pt was c/o syncope-type symptoms. Cardiology was consulted and decides that AS is moderate , therefore pt does not need valve replacement at this time. This issue will be follow by Dr. Gwenlyn Found as an outpatient. 3- HTN: was stable through out hospitalization. Controlled with HCTZ 25 mg daily, enalapril 10 mg daily, and metoprolol 25 mg 1 1/2 pills daily.  4-Hyperlipidemia: FLP showed on 07/14 cholesterol 110, trig: 42, HDL: 35     , LDL:67. He was sent home on pravastatin 20 mg daily. 5-COPD: pt was not on any meds before admission. PFT's : FVC 61 % predicted, FEV-1: 64 % and FEV-1/FVC 93 % ; there was an 18% change pre and post bronchodilator for FVC and a 21 % change for FEV-1.  Despite this results ,TS team felt that it could be more  of a restrictive pattern. PT was d/c'd on symbicort ( free samples from  [facility] 2 to pt unable to afford it), albuterol as needed and atrovent.  Overall pt feels fine. No SOB, CP, HA, visual changes,  palpitations.dizziness/ lightheadness or syncope-like symptoms. He has been compliant with all meds, diet. He needs refills for HCTZ. HE has visited Dr. Gwenlyn Found ( cardiology) who said that CAD/ AS/ HTN are stable and he has a f/u in 6 months. Pt must continue aspirin and plavix     Past Medical History:    -Needs SBE prophylaxis    -AS    -Coronary artery disease    -Hyperlipidemia: FLP on 07/14: cholesterol 110, trig: 42, HDL: 35, LDL:67.     -Hypertension            -COPD    -Depression    -GERD      Past Surgical History:    -2D echo- LVEF 65-75%, mild-mod AS - 10/23/2004,        -Cath-75% block in both LCx and LAD - 03/01/2005        -MId and distal circumflex and mid left anterior descending stenting c/ CYPHER drug eluting stents and Angiomax anticoagulation. By Dr. Gwenlyn Found - 04/21/2005        -Echo - EF wnl, mod LVH, Severe AS,  mild MR, TR, Mild AR,  impaired LV  relaxation -  10/23/2005        -Cath performed on 11/15/06 showed:    1- Multivessel artery disease with patent stents in LAD and circumflex     2-Nwe lesion in prximal circumflex obtuse marginal branch of 95 %     3- AS : transvalvular gradient of 47 mm, 5-EF 75-85 % , hypertrophic profile of LV.        -Stenting of the proximal obtuse marginal branch #1 with a non drug coated stent was performed by Dr. Myrtice Lauth on 0000000 without complications.Plavix and aspirin were started. Pt has f/o with Dr.         Marland KitchenECHO 07/08: EF 75 % (hyperdinamic), Dynamic LV outflow obstruction w/peak velocity 77m/sec w/peak gradient of 16 mmHg. Study inadecuate to r/o bicuspid aortic valve. Aortic valve 1/2 to mod calcified -->consistent with moderate AS. Mean transaortic valve gradient 35 mmHg. Aortic valve area 0.95 cm2.        Risk Factors:  Tobacco use:  quit    Year quit:  2008    Physical Exam   General:     Well-developed,well-nourished,in no acute distress; alert,appropriate and cooperative throughout examination Lungs:     Decreased BS through out, no wheezing , normal respiratory effort, no intercostal retractions, no accessory muscle use, no dullness, no fremitus, and no crackles.   Heart:     normal rate, regular rhythm, no gallop, no rub, and no JVD.  2/6 SEM > on R sternum border , radiates to the carotids. Abdomen:     Bowel sounds positive,abdomen soft and non-tender without masses, organomegaly or hernias noted. Pulses:     R radial normal, R posterior tibial normal, L radial normal, and L posterior tibial normal.   Extremities:     No clubbing, cyanosis, edema, or deformity noted with normal full range of motion of all joints.   Neurologic:     alert & oriented X3, cranial nerves II-XII intact, and strength normal in all extremities.  NO motor or sensory deficits.    Impression & Recommendations:  Problem # 1:  CORONARY, ARTERIOSCLEROSIS (ICD-414.00) S/p cardiac cath on 6 16/08. Pt is cllinically stable , asymptomatic . He has followed up with Dr. Gwenlyn Found ( cardiology) who has not changed meds and will f/u Mr. Kerbel in 6 moths. Pt is to continue plavix, aspirin, metoprolol 25 mg  two times a day,  Accupril 10 mg once daily ( pt could not afford enalapril ), HCTZ 25 mg daily and lipitor 20 mg daily ( changed by aid med program). Hgb: 15.6 at d/c. The biggest obstacle for this pt is that he is unable to afford meds. Currently using [facility] card for meds. Orders: [place]- Est  Level 4 [id number])   Problem # 2:  COPD ([id number]) Pt has a long hx of tobacco abuse. PFT's has shown obstrictive pattern but may also be a restrictive one. Pt was started during last admission on Symbicort ( unable to afford advair, therefore he received free samples from our clinic), atrovent QID and albuterol as needed. I have given  free samples of symbicort (160/ 4.5)  2 puffs two times a day (  3  devices given), Spiriva  2 puffs a day ( to be use instead of atrovent, 1 device given) and albuterol 2 puffs q 4 as needed ( 1 device given). He has quitted smoking.  F/u in 1 month Orders: [facility]- Est  Level 4 [id number])   Problem #  3:  HYPERTENSION, BENIGN SYSTEMIC (ICD-401.1) Controlled with meds. Pt clinically stable . Continue same meds ( see #1). Low sodium diet. Cr: 1.14 at d/c . Orders: [place]- Est  Level 4 [id number])   Problem # 4:  HYPERLIPIDEMIA (B2193296.4) Continue lipitor. LDL: 67, HDL : 35. I will follow up closely. Pt needs tight control of his lipids given his hx of CAD. Low fat diet Orders: [place]- Est  Level 4 [id number])   Problem # 5:  AORTIC STENOSIS (ICD-747.2) Asymptomatic. Continue B-bloquer, HCTZ, low sodium/low fat  diet.I will follow up clinically. Red flags discussed ( such as: dizziness, cp, etc) . To be also follow up by Dr.BERRy. I will follow cardiology recs.   Patient Instructions: 1)  Medicines for your lungs:  2)  Spiriva : 2 puffs once a day 3)  Symbicort: 2 puffs , twice a day 4)  Albuterol: 2 puffs every 4 to 6 hours as needed for shortness of breath 5)  Diet: low salt, low fat diet 6)  Please schedule a follow-up appointment in 1 month. 7)  If you have nose bleed, blood in the stools come to the [facility].

## 2010-07-03 NOTE — Assessment & Plan Note (Signed)
Summary: wp  Nurse Visit    Prior Medications: HYDROCHLOROTHIAZIDE 25 MG TABS (HYDROCHLOROTHIAZIDE) Take 1 tablet by mouth once a day LIPITOR 20 MG TABS (ATORVASTATIN CALCIUM) 1 tablet by mouth once a day NITROGLYCERIN 0.4 MG SUBL (NITROGLYCERIN) Place 1 tablet under tongue as directed PLAVIX 75 MG TABS (CLOPIDOGREL BISULFATE) Take 1 tablet by mouth once a day TOPROL XL 25 MG TB24 (METOPROLOL SUCCINATE) Take 1 tablet by mouth once a day ACCUPRIL 10 MG TABS (QUINAPRIL HCL)  SYMBICORT 80-4.5 MCG/ACT  AERO (BUDESONIDE-FORMOTEROL FUMARATE)  MIRALAX   POWD (POLYETHYLENE GLYCOL 3350) dissolve 1 capful (17 grams) in 8 oz of water two times a day.  Disp 1 large bottle Current Allergies: No known allergies    Influenza Vaccine    Vaccine Type: Fluvax Non-MCR    Site: left deltoid    Mfr: GlaxoSmithKline    Dose: 0.5 ml    Route: IM    Given by: Elray Mcgregor RN    Exp. Date: 6.302010    Lot #: [id number]    VIS given: 12/23/06 version given March 08, 2008.  Flu Vaccine Consent Questions    Do you have a history of severe allergic reactions to this vaccine? no    Any prior history of allergic reactions to egg and/or gelatin? no    Do you have a sensitivity to the preservative Thimersol? no    Do you have a past history of Guillan-Barre Syndrome? no    Do you currently have an acute febrile illness? no    Have you ever had a severe reaction to latex? no    Vaccine information given and explained to patient? yes   Orders Added: 1)  Influenza Vaccine NON MCR [00028] 2)  Est Level 1- [facility] [id number]    ]

## 2010-07-03 NOTE — Miscellaneous (Signed)
Summary: DNKA-abdominal pain  Clinical Lists Changes

## 2010-07-03 NOTE — Miscellaneous (Signed)
Clinical Lists Changes  Problems: Assessed HYPERTENSION, ESSENTIAL NOS as comment only - Not compliant with low sodium diet.He only took Toprol this am because he thinks that this is the only med that he takes for HTN.   Info  discussed and given regarding  HTN meds ( see instructions) and sodium restriction in diet ( handout).  Accupril increased from 10 mg to 20 mg for better BP control. Continues Toprol, HCTZ. F/u in 1 month His updated medication list for this problem includes:    Accupril 20 Mg Tabs (Quinapril hcl) .Marland Kitchen... 1 tab by mouth daily    Hydrochlorothiazide 25 Mg Tabs (Hydrochlorothiazide) .Marland Kitchen... Take 1 tablet by mouth once a day    Toprol Xl 25 Mg Tb24 (Metoprolol succinate) .Marland Kitchen... Take 1 tablet by mouth once a day  Orders: [place]- Est Level  3 [id number])  Assessed COPD as comment only - SOB improved by symbicort/ spiriva / albuterol. Continue same mes. Pt does not have med insurance. NEEDS FREE SAMPLES. f/U IN 1 MONTH TO ENCOURAGE COMPLIANCE AND PROVIDE FREE SAMPLES.  Orders: [facility]- Est Level  3 [id number])  Orders: Added new Service order of Flu Vaccine 23yrs + [phone]) - Signed Added new Service order of Admin 1st Vaccine [id number]) - Signed Added new Test order of [facility]- Est Level  3 [id number]) - Signed Observations: Added new observation of HPI: 2- COPD: Pt does not tolerates symbicort 160/ 8.5( dizziness), But his SOB improved on symbicort 80/4.5 plus spiriva and albuterol as needed.  (02/23/2007 10:40) Added new observation of EXTREMITIES: trace pedal edema bil (02/23/2007 10:40) Added new observation of PEDAL PULSE: 2 + peripheral pulses (02/23/2007 10:40) Added new observation of ABDOMEN EXAM: Bowel sounds positive,abdomen soft and non-tender without masses, organomegaly or hernias noted. (02/23/2007 10:40) Added new observation of HEART EXAM: normal rate, regular rhythm, no gallop, no rub, and no JVD.  2/6 SEM > on R sternum border (02/23/2007 10:40) Added new observation of LUNG EXAM:  Normal respiratory effort, chest expands symmetrically. Lungs are clear to auscultation, no crackles or wheezes. (02/23/2007 10:40) Added new observation of GEN APPEAR: alert and cooperative to examination.   (02/23/2007 10:40) Added new observation of FLU VAX#1VIS: 11/28/04 version given February 23, 2007. (02/23/2007 10:40) Added new observation of FLU VAXLOT: [id number] (02/23/2007 10:40) Added new observation of FLU VAX EXP: 11/29/2007 (02/23/2007 10:40) Added new observation of FLU VAXBY: AMY MARTIN RN (02/23/2007 10:40) Added new observation of FLU VAXRTE: IM (02/23/2007 10:40) Added new observation of FLU VAX DSE: 0.5 ml (02/23/2007 10:40) Added new observation of FLU VAXMFR: [place] (02/23/2007 10:40) Added new observation of FLU VAX SITE: left deltoid (02/23/2007 10:40) Added new observation of FLU VAX: Fluvax 3+ (02/23/2007 10:40) Added new observation of INSTRUCTIONS: Take accupril 20 mg daily  Use Symbicort 80/4.5  Low salt. Please schedule a follow-up appointment in 1- 2 month.  Blood preassure and Heart medicines:metoprolol, HCTZ, accupril, aspirin, plavix  Cholesterol medicines: Lipitor  Lungs medicines: symbicort, spiriva, albuterol (02/23/2007 10:40)       Impression & Recommendations:  Problem # 1:  HYPERTENSION, ESSENTIAL NOS (ICD-401.9) Not compliant with low sodium diet.He only took Toprol this am because he thinks that this is the only med that he takes for HTN.   Info  discussed and given regarding  HTN meds ( see instructions) and sodium restriction in diet ( handout).  Accupril increased from 10 mg to 20 mg for better BP control. Continues Toprol, HCTZ. F/u in 1 month His updated medication  list for this problem includes:    Accupril 20 Mg Tabs (Quinapril hcl) .Marland Kitchen... 1 tab by mouth daily    Hydrochlorothiazide 25 Mg Tabs (Hydrochlorothiazide) .Marland Kitchen... Take 1 tablet by mouth once a day    Toprol Xl 25 Mg Tb24 (Metoprolol succinate) .Marland Kitchen... Take 1 tablet by  mouth once a day  Orders: [place]- Est Level  3 [id number])   Problem # 2:  COPD (ICD-496) SOB improved by symbicort/ spiriva / albuterol. Continue same mes. Pt does not have med insurance. NEEDS FREE SAMPLES. f/U IN 1 MONTH TO ENCOURAGE COMPLIANCE AND PROVIDE FREE SAMPLES.  Orders: [place]- Est Level  3 [id number])   Problem # 3:  Preventive Health Care (ICD-V70.0) Pt will need prostate exam / PSA.  All meds were explained and its use was handled to the patient ( see instructions)  Complete Medication List: 1)  Accupril 20 Mg Tabs (Quinapril hcl) .Marland Kitchen.. 1 tab by mouth daily 2)  Hydrochlorothiazide 25 Mg Tabs (Hydrochlorothiazide) .... Take 1 tablet by mouth once a day 3)  Lipitor 20 Mg Tabs (Atorvastatin calcium) .Marland Kitchen.. 1 tablet by mouth once a day 4)  Nitroglycerin 0.4 Mg Subl (Nitroglycerin) .... Place 1 tablet under tongue as directed 5)  Plavix 75 Mg Tabs (Clopidogrel bisulfate) .... Take 1 tablet by mouth once a day 6)  Toprol Xl 25 Mg Tb24 (Metoprolol succinate) .... Take 1 tablet by mouth once a day  Other Orders: Flu Vaccine 44yrs + [id number]) Admin 1st Vaccine [id number])   History of Present Illness: 2- COPD: Pt does not tolerates symbicort 160/ 8.5( dizziness), But his SOB improved on symbicort 80/4.5 plus spiriva and albuterol as needed.    Physical Exam  General:     alert and cooperative to examination.   Lungs:     Normal respiratory effort, chest expands symmetrically. Lungs are clear to auscultation, no crackles or wheezes. Heart:     normal rate, regular rhythm, no gallop, no rub, and no JVD.  2/6 SEM > on R sternum border Abdomen:     Bowel sounds positive,abdomen soft and non-tender without masses, organomegaly or hernias noted. Pulses:     2 + peripheral pulses Extremities:     trace pedal edema bil   Patient Instructions: 1)  Take accupril 20 mg daily 2)   Use Symbicort 80/4.5  3)  Low salt. 4)  Please schedule a follow-up appointment in 1- 2 month. 5)  Blood  preassure and Heart medicines:metoprolol, HCTZ, accupril, aspirin, plavix 6)  Cholesterol medicines: Lipitor 7)  Lungs medicines: symbicort, spiriva, albuterol    Influenza Vaccine    Vaccine Type: Fluvax 3+    Site: left deltoid    Mfr: [employer]    Dose: 0.5 ml    Route: IM    Given by: AMY MARTIN RN    Exp. Date: 11/29/2007    Lot #: [id number]    VIS given: 11/28/04 version given February 23, 2007.  Flu Vaccine Consent Questions    Do you have a history of severe allergic reactions to this vaccine? no    Any prior history of allergic reactions to egg and/or gelatin? no    Do you have a sensitivity to the preservative Thimersol? no    Do you have a past history of Guillan-Barre Syndrome? no    Do you currently have an acute febrile illness? no    Have you ever had a severe reaction to latex? no    Vaccine information given and explained to  patient? yes

## 2010-07-03 NOTE — Progress Notes (Signed)
Summary: bp med refill  Phone Note Call from Matthew Edwards   Caller: Matthew Edwards Summary of Call: pt needing md to phone refill for bp med to [facility] Initial call taken by: Lupita Raider CMA,,  August 03, 2008 11:19 AM  Follow-up for Phone Call        will forward to MD. Follow-up by: Marcell Barlow RN,  August 03, 2008 11:21 AM      Prescriptions: ACCUPRIL 10 MG TABS (QUINAPRIL HCL) 1 tab by mouth bid  #60 x 3   Entered and Authorized by:   Arnette Norris MD   Signed by:   Arnette Norris MD on 08/07/2008   Method used:   Faxed to ...       [facility] (retail)       [address] [place], [place]  [address]       Ph: [phone]       Fax: [phone]   RxID:   [id number] TOPROL XL 25 MG TB24 (METOPROLOL SUCCINATE) Take 1 tablet by mouth once a day  #31 x 6   Entered and Authorized by:   Arnette Norris MD   Signed by:   Arnette Norris MD on 08/07/2008   Method used:   Faxed to ...       [facility] (retail)       [place], [place]  [address]       Ph: [phone]       Fax: [phone]   RxID:   [id number] HYDROCHLOROTHIAZIDE 25 MG TABS (HYDROCHLOROTHIAZIDE) Take 1 tablet by mouth once a day  #31 x 6   Entered and Authorized by:   Arnette Norris MD   Signed by:   Arnette Norris MD on 08/07/2008   Method used:   Faxed to ...       [facility] (retail)       [address] [place], [place]  [address]       Ph: [phone]       Fax: [phone]   RxID:   [id number]

## 2010-07-03 NOTE — Letter (Signed)
Summary: Results Follow-up Letter  All     ,     Phone:   Fax:     02/28/2010  [address] [place], [place]  [address]  Dear Mr. SPECTOR,   The following are the results of your recent test(s):  Your kidney function, electrolytes are normal.  You continue to have decreased vitamin D level.  We have re-started replacement and will have to re-check your levels in the future.    Sincerely,      Darron Doom MD            Appended Document: Results Follow-up Letter mailed

## 2010-07-03 NOTE — Letter (Signed)
Summary: Generic Letter  [facility]  [address]   [place], [place] [address]   Phone: [phone]  Fax: [phone]    06/26/2010  [address] [place], [place]  [address]  Dear Matthew Edwards,  We are happy to let you know that since you are covered under Medicare you are able to have a FREE visit at the [facility] to discuss your HEALTH. This is a new benefit for Medicare.  There will be no co-payment.  At this visit you will meet with Lamont Dowdy an expert in wellness and the health coach at our clinic.  At this visit we will discuss ways to keep you healthy and feeling well.  This visit will not replace your regular doctor visit and we cannot refill medications.     You will need to plan to be here at least one hour to talk about your medical history, your current status, review all of your medications, and discuss your future plans for your health.  This information will be entered into your record for your doctor to have and review.  If you are interested in staying healthy, this type of visit can help.  Please call the office at: [phone], to schedule a "Medicare Wellness Visit".  The day of the visit you should bring in all of your medications, including any vitamins, herbs, over the counter products you take.  Make a list of all the other doctors that you see, so we know who they are. If you have any other health documents please bring them.  We look forward to helping you stay healthy.  Sincerely,   Suzanne Lineberry [place]

## 2010-07-03 NOTE — Miscellaneous (Signed)
Summary: declined f/u for dizziness  Clinical Lists Changes walked in stating he was told to come in this am for f/u of his dizziness problem. was seen in ED & was given Meclizine. states it is working well & he does not see the need for f/u. refused appt. told him to call us if it comes back. cited money as a reason for not wanting appt. has appt with cardiac md tomorrow att 8:30.Elige Radon RN  June 16, 2010 8:45 AM

## 2010-07-03 NOTE — Progress Notes (Signed)
       Additional Follow-up for Phone Call Additional follow up Details #2::    note from [facility] center rec'd. pt cancelled his appt on 07/23/08 Follow-up by: Elige Radon RN,  July 10, 2008 11:36 AM

## 2010-07-03 NOTE — Miscellaneous (Signed)
  Clinical Lists Changes  Problems: Removed problem of ANGINA PECTORIS, NOS (ICD-413.9) Removed problem of HEARING LOSS (ICD-389.9) Removed problem of SPECIAL SCREENING MALIGNANT NEOPLASM OF PROSTATE (ICD-V76.44)

## 2010-07-03 NOTE — Consult Note (Signed)
Summary: [place]   Imported By: Drucie Ip 06/16/2007 14:15:04  _____________________________________________________________________  External Attachment:    Type:   Image     Comment:   External Document

## 2010-07-03 NOTE — Consult Note (Signed)
Summary: [place] By: Raymond Gurney 06/20/2009 16:35:16  _____________________________________________________________________  External Attachment:    Type:   Image     Comment:   External Document

## 2010-07-03 NOTE — Assessment & Plan Note (Signed)
Summary: F/U  DPG   Vital Signs:  Patient Profile:   70 Years Old Male Weight:      216 pounds Temp:     98.4 degrees F Pulse rate:   60 / minute BP sitting:   152 / 75  Pt. in pain?   no  Vitals Entered By: Christen Bame CMA (January 04, 2008 8:31 AM)                   Chief Complaint:  f/u abdominal pain.  History of Present Illness: 70 yo new to me with multiple medical problems who presents for follow up of abdominal pain.  1.  Abdominal pain- was seen two weeks ago as work in.   Felt to be secondary to constipation as his pain is relieved by defecation and he was not stooling reguarly at that time.  Was prescribed Miralax and coace.  Has been taking them daily and is now have stools every couple of days.  He states they are normal in consistency.  He denies any BRPR or melena.  He is trying to increase fiber and water intake.  2.  HLD- could not find a recent cholesterol level.  WOuld like to recheck it but he states he does that at [facility] cards.      Prior Medication List:  HYDROCHLOROTHIAZIDE 25 MG TABS (HYDROCHLOROTHIAZIDE) Take 1 tablet by mouth once a day LIPITOR 20 MG TABS (ATORVASTATIN CALCIUM) 1 tablet by mouth once a day NITROGLYCERIN 0.4 MG SUBL (NITROGLYCERIN) Place 1 tablet under tongue as directed PLAVIX 75 MG TABS (CLOPIDOGREL BISULFATE) Take 1 tablet by mouth once a day TOPROL XL 25 MG TB24 (METOPROLOL SUCCINATE) Take 1 tablet by mouth once a day ACCUPRIL 10 MG TABS (QUINAPRIL HCL)  SYMBICORT 80-4.5 MCG/ACT  AERO (BUDESONIDE-FORMOTEROL FUMARATE)  MIRALAX   POWD (POLYETHYLENE GLYCOL 3350) dissolve 1 capful (17 grams) in 8 oz of water two times a day.  Disp 1 large bottle   Current Allergies: No known allergies     Risk Factors:  Tobacco use:  quit    Year quit:  2008   Review of Systems      See HPI   Physical Exam  General:     alert and cooperative to examination.   Lungs:     Normal respiratory effort, chest expands symmetrically.  Lungs are clear to auscultation, no crackles or wheezes. Heart:     normal rate, regular rhythm, no gallop, no rub, and no JVD.  very prominent 2/6 SEM heard throughout precordium Abdomen:     Bowel sounds positive,abdomen soft and non-tender without masses, organomegaly or hernias noted.    Impression & Recommendations:  Problem # 1:  ABDOMINAL PAIN (ICD-789.00) Assessment: Improved Improved with resolution of his constipation.  Advised to conitnue with Miralax and colace as needed.  Also continue with water and fiber.  Will refer for screening colonoscopy as he has not had one. Orders: [place]- Est Level  3 [id number])   Problem # 2:  HYPERLIPIDEMIA (B2193296.4) Assessment: Comment Only Will need to get [facility]. His updated medication list for this problem includes:    Lipitor 20 Mg Tabs (Atorvastatin calcium) .Marland Kitchen... 1 tablet by mouth once a day   Complete Medication List: 1)  Hydrochlorothiazide 25 Mg Tabs (Hydrochlorothiazide) .... Take 1 tablet by mouth once a day 2)  Lipitor 20 Mg Tabs (Atorvastatin calcium) .Marland Kitchen.. 1 tablet by mouth once a day 3)  Nitroglycerin 0.4 Mg Subl (  Nitroglycerin) .... Place 1 tablet under tongue as directed 4)  Plavix 75 Mg Tabs (Clopidogrel bisulfate) .... Take 1 tablet by mouth once a day 5)  Toprol Xl 25 Mg Tb24 (Metoprolol succinate) .... Take 1 tablet by mouth once a day 6)  Accupril 10 Mg Tabs (Quinapril hcl) 7)  Symbicort 80-4.5 Mcg/act Aero (Budesonide-formoterol fumarate) 8)  Miralax Powd (Polyethylene glycol 3350) .... Dissolve 1 capful (17 grams) in 8 oz of water two times a day.  disp 1 large bottle  Other Orders: Gastroenterology Referral (GI)    ]

## 2010-07-03 NOTE — Assessment & Plan Note (Signed)
Summary: Dizziness     Prescriptions: MECLIZINE HCL 25 MG TABS (MECLIZINE HCL) 1 tab by mouth two times a day  #60 x 0   Entered and Authorized by:   Shanda Howells MD   Signed by:   Shanda Howells MD on 06/14/2010   Method used:   Print then Give to Patient   RxID:   [id number]   Impression & Recommendations:  Problem # 1:  DIZZINESS (ICD-780.4) Assessment New  Dizziness likely new onset vertigo. + horizontal nystagmus and dix-hallpike.  Orthostatics negative. Will rx meclizine in ED. Wil continue with gentle IV hydration in setting of diastolic HF. Will ambulate pt as tolerated for assessing symptom improvement. Will likely send home on rx for meclizine.   His updated medication list for this problem includes:    Meclizine Hcl 25 Mg Tabs (Meclizine hcl) .Marland Kitchen... 1 tab by mouth two times a day  Problem # 2:  HYPERTENSION, ESSENTIAL NOS (ICD-401.9) BPs stable in ED. Orthostatic negative. Instructed pt to cont home meds.  His updated medication list for this problem includes:    Toprol Xl 25 Mg Tb24 (Metoprolol succinate) .Marland Kitchen... Take 1 tablet by mouth once a day    Quinapril-hydrochlorothiazide 20-25 Mg Tabs (Quinapril-hydrochlorothiazide) .Marland Kitchen... 1 by mouth once daily for blood pressure (replaces old hctz and accupril)  Problem # 3:  HYPERCALCEMIA (ICD-275.42) Assessment: Unchanged Ca WNL in ED   Problem # 4:  Cardiac Assessment: Unchanged Pt at overall cardiac baseline on presentation. No dyspnea, orthopnea. Most recent ECHO at [facility] 06/2009 has pt with EF [id number], diastolic dysfunction, LVH, and moderate-severe AS.Pt has cardiology followup in 06/17/2010 with Dr. Gwenlyn Found. Currently no concerns for acute decompensation of cardiac function given overall clinical exam.   Problem # 5:  Dispo [place] clinical improvement.   Complete Medication List: 1)  Lipitor 20 Mg Tabs (Atorvastatin calcium) .Marland Kitchen.. 1 tablet by mouth once a day 2)  Nitroglycerin 0.4 Mg Subl (Nitroglycerin) .... Place 1  tablet under tongue as directed 3)  Plavix 75 Mg Tabs (Clopidogrel bisulfate) .... Take 1 tablet by mouth once a day 4)  Toprol Xl 25 Mg Tb24 (Metoprolol succinate) .... Take 1 tablet by mouth once a day 5)  Quinapril-hydrochlorothiazide 20-25 Mg Tabs (Quinapril-hydrochlorothiazide) .Marland Kitchen.. 1 by mouth once daily for blood pressure (replaces old hctz and accupril) 6)  Symbicort 80-4.5 Mcg/act Aero (Budesonide-formoterol fumarate) .Marland Kitchen.. 1 puff inhaled two times a day.  last fills before visit with doctor 7)  Miralax Powd (Polyethylene glycol 3350) .... Dissolve 1 capful (17 grams) in 8 oz of water 1-2 times daily as needed for constipation.  disp 1 large bottle 8)  Aspirin 81 Mg Tbec (Aspirin) .... Daily 9)  Vitamin D (ergocalciferol) 50000 Unit Caps (Ergocalciferol) .Marland Kitchen.. 1 by mouth qwk for 8 weeks for deficiency in vitamin d 10)  Pepcid 20 Mg Tabs (Famotidine) .Marland Kitchen.. 1 by mouth two times a day 11)  Meclizine Hcl 25 Mg Tabs (Meclizine hcl) .Marland Kitchen.. 1 tab by mouth two times a day    Primary Care Provider:  Darron Doom MD   History of Present Illness: 72 YOM w/ PMHx/o CHF, AS, CAD s/p stents, HTN with one day hx/o dizziness. Pt reports eating dinner yesterday evening with pt then becoming acutely sick overnight. Emesis x3 last night. NBNB. Noticed severe dizziness with sitting and laying around time of nausea/emesis. Dizziness most prominent with laying down. Feels like the room is spinning. Has never had episode lilke this in the past. Pt states that  he may have ahd viral illness 2-3 weeks prior,  but unsure. Has had intermittent rhinorrhea. Pt denies any CP, orthopnea, chest pressure, dyspnea, fevers, chills, Le swelling, visual loss, nuchal rigidity. No headache. No BRBPR. No diarrhea.  Has been compliant with home medications per pt. Pt's daughter called emergency line this am. Was instructed to give pt OTC meclizine. Pt states that he has not taken any new medication since phone call was made. Was given  zofran in ED with minimal improvement in nausea.     Physical Exam  General:  alert.  up in bed Head:  NCAT, horizontal nystagmus  Nose:  no external deformity.   Mouth:  good dentition  Neck:  supple, full ROM , no JVD  Lungs:  CTAB Heart:  + II-III/VI systolic ejection murmur Abdomen:  obese, hypoactive bowel sounds, non tender Extremities:  2+ peripheral pulses, trace edema  Neurologic:  alert & oriented X3 and cranial nerves II-XII intact. + horizontal nystagmus bilaterally  Dix-hallpike + Minimal ataxia while walking FInger-nose intact.   Additional Exam:  T-97.5       HR-60s       RR- 18      BP-120s-160s/60s-70s       SaO2: 99% RA  CBC: WNL BMET: WNL EKG: NSR, HR 60s, t wave inversions in v5-v6(unchanged from 2008) Head CT: NAICA CXR: mild pulmonary vasc congestion Orthostatics: neg    Past History:  Past Medical History: Last updated: 03/31/2010 -Needs SBE prophylaxis -AS -Coronary artery disease -Hyperlipidemia: FLP on 07/14: cholesterol 110, trig: 42, HDL: 35, LDL:67.  -Hypertension -COPD -Depression -GERD  Past Surgical History: Last updated: 12/10/2006 -2D echo- LVEF 65-75%, mild-mod AS - 10/23/2004,  -Cath-75% block in both LCx and LAD - 03/01/2005  -MId and distal circumflex and mid left anterior descending stenting c/ CYPHER drug eluting stents and Angiomax anticoagulation. By Dr. Gwenlyn Found - 04/21/2005  -Echo - EF wnl, mod LVH, Severe AS,  mild MR, TR, Mild AR, impaired LV  relaxation -  10/23/2005  -Cath performed on 11/15/06 showed: 1- Multivessel artery disease with patent stents in LAD and circumflex  2-Nwe lesion in prximal circumflex obtuse marginal branch of 95 %  3- AS : transvalvular gradient of 47 mm, 5-EF 75-85 % , hypertrophic profile of LV.  -Stenting of the proximal obtuse marginal branch #1 with a non drug coated stent was performed by Dr. Myrtice Lauth on 0000000 without complications.Plavix and aspirin were started. Pt has f/o with  Dr.   Marland KitchenECHO 07/08: EF 75 % (hyperdinamic), Dynamic LV outflow obstruction w/peak velocity 58m/sec w/peak gradient of 16 mmHg. Study inadecuate to r/o bicuspid aortic valve. Aortic valve 1/2 to mod calcified -->consistent with moderate AS. Mean transaortic valve gradient 35 mmHg. Aortic valve area 0.95 cm2.  Family History: Last updated: 08-05-2006 dad died 72s - murdered, [id number] - heart disease  Social History: Last updated: 05/27/2009 Separated from wife for about 20 years (don't divorce 2/2 financial reasons) Smoked apporx 1 pack per day, but quit in 2009.  H/O  Etoh abuse, stopped drinking in 2/06. Used to Work at [employer] prior to diagnosis with heart disease  Risk Factors: Smoking Status: quit (02/21/2010) Packs/Day: 4 cigarettes per week (02/21/2010)   Prevention & Chronic Care Immunizations   Influenza vaccine: Historical  (03/01/2009)   Influenza vaccine due: 03/07/2009    Tetanus booster: Not documented    Pneumococcal vaccine: Not documented    H. zoster vaccine: Not documented  Colorectal Screening   Hemoccult: Not  documented   Hemoccult due: Not Indicated    Colonoscopy: abnormal  (02/10/2008)   Colonoscopy due: 02/09/2013  Other Screening   PSA: 0.34  (05/27/2009)   PSA action/deferral: Discussed-PSA requested  (05/27/2009)   PSA due due: 03/15/2009   Smoking status: quit  (02/21/2010)  Lipids   Total Cholesterol: 122  (05/27/2009)   LDL: 67  (05/27/2009)   LDL Direct: Not documented   HDL: 40  (05/27/2009)   Triglycerides: 75  (05/27/2009)    SGOT (AST): 15  (02/21/2010)   SGPT (ALT): 10  (02/21/2010)   Alkaline phosphatase: 62  (02/21/2010)   Total bilirubin: 0.6  (02/21/2010)  Hypertension   Last Blood Pressure: 118 / 68  (03/31/2010)   Serum creatinine: 1.08  (02/21/2010)   Serum potassium 3.8  (02/21/2010)  Self-Management Support :   Personal Goals (by the next clinic visit) :      Personal blood pressure goal: 140/90  (05/27/2009)      Personal LDL goal: 100  (05/27/2009)    Hypertension self-management support: Not documented    Hypertension self-management support not done because: Good outcomes  (05/27/2009)    Lipid self-management support: Not documented     Lipid self-management support not done because: Good outcomes  (05/27/2009)

## 2010-07-03 NOTE — Assessment & Plan Note (Signed)
Summary: f/up,tcb   Vital Signs:  Patient profile:   70 year old male Height:      69.75 inches Weight:      228.1 pounds BMI:     33.08 Temp:     98.6 degrees F oral Pulse rate:   66 / minute BP sitting:   153 / 78  (right arm) Cuff size:   regular  Vitals Entered By: Levert Feinstein LPN (September 23, 624THL 9:12 AM) CC: f/u meds Is Patient Diabetic? No Pain Assessment Patient in pain? no        Primary Care Provider:  Patria Mane  MD  CC:  f/u meds.  History of Present Illness: Here today because he cannot get meds refilled without a prescription. Feels well except when he walks to mailbox, at times he has difficulty in getting back to house.  Denies specific chest pain, shortness of breath or aniginal symptoms.  Denies syncope, presyncope.  Discussed last ECHO findings with pt.  Discussed elevated CA.  Habits & Providers  Alcohol-Tobacco-Diet     Tobacco Status: quit     Cigarette Packs/Day: 4 cigarettes per week     Year Quit: 2009  Current Problems (verified): 1)  Vitamin D Deficiency  (ICD-268.9) 2)  Primary Hyperparathyroidism  (ICD-252.01) 3)  Hypercalcemia  (ICD-275.42) 4)  Heart Failure, Congestive Unspec  (ICD-428.0) 5)  Hypertension, Essential Nos  (ICD-401.9) 6)  Coronary Artery Disease  (ICD-414.00) 7)  Angina Pectoris, Nos  (ICD-413.9) 8)  Hyperlipidemia  (ICD-272.4) 9)  Aortic Stenosis  (ICD-747.2) 10)  COPD  (ICD-496) 11)  Hx of Tobacco Dependence  (ICD-305.1) 12)  Gerd  (ICD-530.81) 13)  Depression  (ICD-311) 14)  Hx of Alcohol Abuse, Unspecified  (ICD-305.00) 15)  Hearing Loss  (ICD-389.9) 16)  Special Screening Malignant Neoplasm of Prostate  (ICD-V76.44)  Current Medications (verified): 1)  Lipitor 20 Mg Tabs (Atorvastatin Calcium) .Marland Kitchen.. 1 Tablet By Mouth Once A Day 2)  Nitroglycerin 0.4 Mg Subl (Nitroglycerin) .... Place 1 Tablet Under Tongue As Directed 3)  Plavix 75 Mg Tabs (Clopidogrel Bisulfate) .... Take 1 Tablet By Mouth Once A  Day 4)  Toprol Xl 25 Mg Tb24 (Metoprolol Succinate) .... Take 1 Tablet By Mouth Once A Day 5)  Quinapril-Hydrochlorothiazide 20-25 Mg Tabs (Quinapril-Hydrochlorothiazide) .Marland Kitchen.. 1 By Mouth Once Daily For Blood Pressure (Replaces Old Hctz and Accupril) 6)  Symbicort 80-4.5 Mcg/act  Aero (Budesonide-Formoterol Fumarate) .Marland Kitchen.. 1 Puff Inhaled Two Times A Day.  Last Fills Before Visit With Doctor 7)  Miralax   Powd (Polyethylene Glycol 3350) .... Dissolve 1 Capful (17 Grams) in 8 Oz of Water 1-2 Times Daily As Needed For Constipation.  Disp 1 Large Bottle 8)  Ranitidine Hcl 150 Mg Caps (Ranitidine Hcl) .Marland Kitchen.. 1 By Mouth 1-2 Times Daily For Heartburn 9)  Aspirin 81 Mg Tbec (Aspirin) .... Daily 10)  Vitamin D (Ergocalciferol) 50000 Unit Caps (Ergocalciferol) .Marland Kitchen.. 1 By Mouth Qwk For 8 Weeks For Deficiency in Vitamin D  Allergies (verified): 1)  ! Pineapple Concentrate (Flavoring Agent)  Past History:  Past Medical History: Last updated: 12/10/2006 -Needs SBE prophylaxis -AS -Coronary artery disease -Hyperlipidemia: FLP on 07/14: cholesterol 110, trig: 42, HDL: 35, LDL:67.  -Hypertension   -COPD -Depression -GERD  Past Surgical History: Last updated: 12/10/2006 -2D echo- LVEF 65-75%, mild-mod AS - 10/23/2004,  -Cath-75% block in both LCx and LAD - 03/01/2005  -MId and distal circumflex and mid left anterior descending stenting c/ CYPHER drug eluting stents and Angiomax anticoagulation. By  Dr. Gwenlyn Found - 04/21/2005  -Echo - EF wnl, mod LVH, Severe AS,  mild MR, TR, Mild AR, impaired LV  relaxation -  10/23/2005  -Cath performed on 11/15/06 showed: 1- Multivessel artery disease with patent stents in LAD and circumflex  2-Nwe lesion in prximal circumflex obtuse marginal branch of 95 %  3- AS : transvalvular gradient of 47 mm, 5-EF 75-85 % , hypertrophic profile of LV.  -Stenting of the proximal obtuse marginal branch #1 with a non drug coated stent was performed by Dr. Myrtice Lauth on 0000000  without complications.Plavix and aspirin were started. Pt has f/o with Dr.   Marland KitchenECHO 07/08: EF 75 % (hyperdinamic), Dynamic LV outflow obstruction w/peak velocity 64m/sec w/peak gradient of 16 mmHg. Study inadecuate to r/o bicuspid aortic valve. Aortic valve 1/2 to mod calcified -->consistent with moderate AS. Mean transaortic valve gradient 35 mmHg. Aortic valve area 0.95 cm2.  Family History: Last updated: Aug 14, 2006 dad died 57s - murdered, mom-died89 - heart disease  Social History: Last updated: 05/27/2009 Separated from wife for about 20 years (don't divorce 2/2 financial reasons) Smoked apporx 1 pack per day, but quit in 2009.  H/O  Etoh abuse, stopped drinking in 2/06. Used to Work at [employer] prior to diagnosis with heart disease  Risk Factors: Smoking Status: quit (02/21/2010) Packs/Day: 4 cigarettes per week (02/21/2010)  Review of Systems       The patient complains of dyspnea on exertion.  The patient denies anorexia, weight loss, weight gain, chest pain, syncope, peripheral edema, headaches, hemoptysis, abdominal pain, hematochezia, and severe indigestion/heartburn.    Physical Exam  General:  alert, well-developed, and well-nourished.   Head:  normocephalic and atraumatic.   Neck:  supple.   Lungs:  normal respiratory effort.   Heart:  normal rate, regular rhythm, no gallop, no rub, and no JVD.  harsh 2/6 SEM heard throughout precordium that radiates to carotids bilaterally Abdomen:  soft and non-tender.     Impression & Recommendations:  Problem # 1:  HYPERTENSION, ESSENTIAL NOS (ICD-401.9)  His updated medication list for this problem includes:    Toprol Xl 25 Mg Tb24 (Metoprolol succinate) .Marland Kitchen... Take 1 tablet by mouth once a day    Quinapril-hydrochlorothiazide 20-25 Mg Tabs (Quinapril-hydrochlorothiazide) .Marland Kitchen... 1 by mouth once daily for blood pressure (replaces old hctz and accupril)  Orders: [place]- Est Level  3 [id number])  Problem # 2:  AORTIC STENOSIS  (ICD-747.2) To follow up with Cards in Jan. 2012.  Problem # 3:  PRIMARY HYPERPARATHYROIDISM (ICD-252.01)  Orders: T-Parathyroid Hormone, Intact w/ Calcium [id number]) [place]- Est Level  3 [id number])  Problem # 4:  HYPERCALCEMIA (ICD-275.42)  Orders: T-Parathyroid Hormone, Intact w/ Calcium [id number])  Problem # 5:  COPD ([id number])  His updated medication list for this problem includes:    Symbicort 80-4.5 Mcg/act Aero (Budesonide-formoterol fumarate) .Marland Kitchen... 1 puff inhaled two times a day.  last fills before visit with doctor  Orders: [facility]- Est Level  3 [id number])  Problem # 6:  GERD (ICD-530.81)  His updated medication list for this problem includes:    Ranitidine Hcl 150 Mg Caps (Ranitidine hcl) .Marland Kitchen... 1 by mouth 1-2 times daily for heartburn  Complete Medication List: 1)  Lipitor 20 Mg Tabs (Atorvastatin calcium) .Marland Kitchen.. 1 tablet by mouth once a day 2)  Nitroglycerin 0.4 Mg Subl (Nitroglycerin) .... Place 1 tablet under tongue as directed 3)  Plavix 75 Mg Tabs (Clopidogrel bisulfate) .... Take 1 tablet by mouth once a day 4)  Toprol Xl  25 Mg Tb24 (Metoprolol succinate) .... Take 1 tablet by mouth once a day 5)  Quinapril-hydrochlorothiazide 20-25 Mg Tabs (Quinapril-hydrochlorothiazide) .Marland Kitchen.. 1 by mouth once daily for blood pressure (replaces old hctz and accupril) 6)  Symbicort 80-4.5 Mcg/act Aero (Budesonide-formoterol fumarate) .Marland Kitchen.. 1 puff inhaled two times a day.  last fills before visit with doctor 7)  Miralax Powd (Polyethylene glycol 3350) .... Dissolve 1 capful (17 grams) in 8 oz of water 1-2 times daily as needed for constipation.  disp 1 large bottle 8)  Ranitidine Hcl 150 Mg Caps (Ranitidine hcl) .Marland Kitchen.. 1 by mouth 1-2 times daily for heartburn 9)  Aspirin 81 Mg Tbec (Aspirin) .... Daily 10)  Vitamin D (ergocalciferol) 50000 Unit Caps (Ergocalciferol) .Marland Kitchen.. 1 by mouth qwk for 8 weeks for deficiency in vitamin d  Other Orders: Comp [facility] [phone]) Vit D, 25 [place]  [id number]) Phosphorus-[facility] [id number])  Patient Instructions: 1)  Please schedule a follow-up appointment in 3 months .  Prescriptions: VITAMIN D (ERGOCALCIFEROL) 50000 UNIT CAPS (ERGOCALCIFEROL) 1 by mouth qwk for 8 weeks for deficiency in vitamin D  #8 x 1   Entered and Authorized by:   Darron Doom MD   Signed by:   Darron Doom MD on 02/21/2010   Method used:   Print then Give to Patient   RxID:   [id number] RANITIDINE HCL 150 MG CAPS (RANITIDINE HCL) 1 by mouth 1-2 times daily for heartburn  #180 x 1   Entered and Authorized by:   Darron Doom MD   Signed by:   Darron Doom MD on 02/21/2010   Method used:   Print then Give to Patient   RxID:   [id number] MIRALAX   POWD (POLYETHYLENE GLYCOL 3350) dissolve 1 capful (17 grams) in 8 oz of water 1-2 times daily as needed for constipation.  Disp 1 large bottle  #1 x 6   Entered and Authorized by:   Darron Doom MD   Signed by:   Darron Doom MD on 02/21/2010   Method used:   Print then Give to Patient   RxID:   [id number] QUINAPRIL-HYDROCHLOROTHIAZIDE 20-25 MG TABS (QUINAPRIL-HYDROCHLOROTHIAZIDE) 1 by mouth once daily for blood pressure (replaces old HCTZ and accupril)  #90 x 1   Entered and Authorized by:   Darron Doom MD   Signed by:   Darron Doom MD on 02/21/2010   Method used:   Print then Give to Patient   RxID:   [id number] TOPROL XL 25 MG TB24 (METOPROLOL SUCCINATE) Take 1 tablet by mouth once a day  #90 x 1   Entered and Authorized by:   Darron Doom MD   Signed by:   Darron Doom MD on 02/21/2010   Method used:   Print then Give to Patient   RxID:   [id number] PLAVIX 75 MG TABS (CLOPIDOGREL BISULFATE) Take 1 tablet by mouth once a day  #90 x 1   Entered and Authorized by:   Darron Doom MD   Signed by:   Darron Doom MD on 02/21/2010   Method used:   Print then Give to Patient   RxID:   [id number] LIPITOR 20 MG TABS (ATORVASTATIN CALCIUM) 1 tablet by mouth once a day  #90 x 1   Entered and  Authorized by:   Darron Doom MD   Signed by:   Darron Doom MD on 02/21/2010   Method used:   Print then Give to Patient   RxID:   [id number]

## 2010-07-03 NOTE — Assessment & Plan Note (Signed)
Summary: welcome to medicare visit   Vital Signs:  Patient Profile:   70 Years Old Male Height:     69.75 inches Weight:      220 pounds Temp:     97.8 degrees F Pulse rate:   60 / minute BP sitting:   166 / 81  Pt. in pain?   no  Vitals Entered By: Christen Bame CMA (July 09, 2008 9:43 AM)               Vision Screening: Left eye w/o correction: 20 / 20 Right Eye w/o correction: 20 / 25 Both eyes w/o correction:  20/ 20        Vision Entered By: Christen Bame CMA (July 09, 2008 9:44 AM) Audiometry Screening        Left  500 hz: 40db 1000 hz: 40db 2000 hz: 40db 4000 hz: 40db Right  500 hz: No Response 1000 hz: No Response 2000 hz: No Response 4000 hz: No Response   Hearing Testing Entered By: [place] (July 09, 2008 9:44 AM)  Last HDL:  45 (03/15/2008 8:47:00 PM) HDL Next Due:  1 yr Last LDL:  63 (03/15/2008 8:47:00 PM) LDL Next Due:  1 yr Flex Sig Next Due:  Not Indicated Hemoccult Next Due:  Not Indicated   Chief Complaint:  medicare visit.  History of Present Illness: 70 yo with h/o HTN, HLD, CHF presents for welcome to medicare visit.  He lives alone and is completely independent, has not fallen in the past year.  Has not felt down, depressed, or hopeless in last two weeks.  He does have hearing difficulties and never evaluated by an ENT or audiologist.  He does exercise reguarly and quit smoking 2 years ago.    CHF-  last echo 1 year ago showed normla LVEF, mild concetric LVh with moderate aortic stenosis.  Followed by Dr. Gwenlyn Found, last OV on 07/02/08.    HTN- has not taken BP meds today but typically compliant.  Denies any CP, SOB, blurry vision, or HA.  Prevention- UTD.      Updated Prior Medication List: HYDROCHLOROTHIAZIDE 25 MG TABS (HYDROCHLOROTHIAZIDE) Take 1 tablet by mouth once a day LIPITOR 20 MG TABS (ATORVASTATIN CALCIUM) 1 tablet by mouth once a day NITROGLYCERIN 0.4 MG SUBL (NITROGLYCERIN) Place 1 tablet  under tongue as directed PLAVIX 75 MG TABS (CLOPIDOGREL BISULFATE) Take 1 tablet by mouth once a day TOPROL XL 25 MG TB24 (METOPROLOL SUCCINATE) Take 1 tablet by mouth once a day ACCUPRIL 10 MG TABS (QUINAPRIL HCL) 1 tab by mouth bid SYMBICORT 80-4.5 MCG/ACT  AERO (BUDESONIDE-FORMOTEROL FUMARATE)  MIRALAX   POWD (POLYETHYLENE GLYCOL 3350) dissolve 1 capful (17 grams) in 8 oz of water two times a day.  Disp 1 large bottle  Current Allergies: ! PINEAPPLE CONCENTRATE (FLAVORING AGENT)  Past Medical History:    Reviewed history from 12/10/2006 and no changes required:       -Needs SBE prophylaxis       -AS       -Coronary artery disease       -Hyperlipidemia: FLP on 07/14: cholesterol 110, trig: 42, HDL: 35, LDL:67.        -Hypertension                     -COPD       -Depression       -GERD         Past Surgical History:  Reviewed history from 12/10/2006 and no changes required:       -2D echo- LVEF 65-75%, mild-mod AS - 10/23/2004,              -Cath-75% block in both LCx and LAD - 03/01/2005              -MId and distal circumflex and mid left anterior descending stenting c/ CYPHER drug eluting stents and Angiomax anticoagulation. By Dr. Gwenlyn Found - 04/21/2005              -Echo - EF wnl, mod LVH, Severe AS,  mild MR, TR, Mild AR, impaired LV  relaxation -  10/23/2005              -Cath performed on 11/15/06 showed:       1- Multivessel artery disease with patent stents in LAD and circumflex        2-Nwe lesion in prximal circumflex obtuse marginal branch of 95 %        3- AS : transvalvular gradient of 47 mm, 5-EF 75-85 % , hypertrophic profile of LV.              -Stenting of the proximal obtuse marginal branch #1 with a non drug coated stent was performed by Dr. Myrtice Lauth on 0000000 without complications.Plavix and aspirin were started. Pt has f/o with Dr.               Marland KitchenECHO 07/08: EF 75 % (hyperdinamic), Dynamic LV outflow obstruction w/peak velocity 29m/sec w/peak  gradient of 16 mmHg. Study inadecuate to r/o bicuspid aortic valve. Aortic valve 1/2 to mod calcified -->consistent with moderate AS. Mean transaortic valve gradient 35 mmHg. Aortic valve area 0.95 cm2.          Family History:    Reviewed history from 07/29/2006 and no changes required:       dad died 57s - murdered, mom-died89 - heart disease  Social History:    Reviewed history from 07/29/2006 and no changes required:       Separated from wife for about 20 years (don't divorce 2/2 financial reasons) Smoked apporx 1 pack per day, down to 3 cig/day as of 5/07. H/O  Etoh abuse, stopped drinking in 2/06.Used to Work at [employer] prior to diagnosis with heart disease    Review of Systems      See HPI   Physical Exam  General:     alert and cooperative to examination.   Ears:     significant wax in both ears, unable to visualize the TMs, attempted to remove wax but remains Lungs:     Normal respiratory effort, chest expands symmetrically. Lungs are clear to auscultation, no crackles or wheezes. Heart:     normal rate, regular rhythm, no gallop, no rub, and no JVD.  very prominent 2/6 SEM heard throughout precordium Abdomen:     Bowel sounds positive,abdomen soft and non-tender without masses, organomegaly or hernias noted.    Impression & Recommendations:  Problem # 1:  HEARING LOSS (ICD-389.9) Assessment: Deteriorated Will refer to ENT/ Audiology.  Likely partially conductive along with ear wax.   Orders: ENT Referral (ENT) [place] [phone])   Problem # 2:  HYPERTENSION, ESSENTIAL NOS (ICD-401.9) Assessment: Deteriorated Secondary to not taking meds today.  Is typically complaitn.  BP was 142/84 in cardiology office on 07/02/08 (did not take his meds that morning either because they were checking his cholesterol and he thought he could  not take it). His updated medication list for this problem includes:    Hydrochlorothiazide 25 Mg Tabs (Hydrochlorothiazide) .Marland Kitchen... Take 1  tablet by mouth once a day    Toprol Xl 25 Mg Tb24 (Metoprolol succinate) .Marland Kitchen... Take 1 tablet by mouth once a day    Accupril 10 Mg Tabs (Quinapril hcl) .Marland Kitchen... 1 tab by mouth bid  Orders: [place] [phone])   Problem # 3:  HEART FAILURE, CONGESTIVE UNSPEC (ICD-428.0) Per cards.  will have Repeat 2 Decho in February 2011. His updated medication list for this problem includes:    Hydrochlorothiazide 25 Mg Tabs (Hydrochlorothiazide) .Marland Kitchen... Take 1 tablet by mouth once a day    Plavix 75 Mg Tabs (Clopidogrel bisulfate) .Marland Kitchen... Take 1 tablet by mouth once a day    Toprol Xl 25 Mg Tb24 (Metoprolol succinate) .Marland Kitchen... Take 1 tablet by mouth once a day    Accupril 10 Mg Tabs (Quinapril hcl) .Marland Kitchen... 1 tab by mouth bid  Orders: [place] [phone])   Problem # 4:  TOBACCO DEPENDENCE (ICD-305.1) Assessment: Improved Quit smoking 2 years ago!!!!  Advertising account executive on success.  Complete Medication List: 1)  Hydrochlorothiazide 25 Mg Tabs (Hydrochlorothiazide) .... Take 1 tablet by mouth once a day 2)  Lipitor 20 Mg Tabs (Atorvastatin calcium) .Marland Kitchen.. 1 tablet by mouth once a day 3)  Nitroglycerin 0.4 Mg Subl (Nitroglycerin) .... Place 1 tablet under tongue as directed 4)  Plavix 75 Mg Tabs (Clopidogrel bisulfate) .... Take 1 tablet by mouth once a day 5)  Toprol Xl 25 Mg Tb24 (Metoprolol succinate) .... Take 1 tablet by mouth once a day 6)  Accupril 10 Mg Tabs (Quinapril hcl) .Marland Kitchen.. 1 tab by mouth bid 7)  Symbicort 80-4.5 Mcg/act Aero (Budesonide-formoterol fumarate) 8)  Miralax Powd (Polyethylene glycol 3350) .... Dissolve 1 capful (17 grams) in 8 oz of water two times a day.  disp 1 large bottle  Other Orders: [facility]- [place] [phone]) [facility] [phone])   Patient Instructions: 1)  Nice to see you, Matthew Edwards. 2)  Try increasing fiber in your diet, like we talked about. 3)  Dont forget to take your blood pressure medication when you get home.

## 2010-07-03 NOTE — Progress Notes (Signed)
Summary: Rx Req  Phone Note Call from Patient Call back at [phone] or [phone]   Caller: Daughter-Deidre Summary of Call: Would like to get a paper script for Cymbocort.   Initial call taken by: Raymond Gurney,  April 29, 2009 8:59 AM  Follow-up for Phone Call        will forward message to MD. Follow-up by: Marcell Barlow RN,  April 29, 2009 9:06 AM  Additional Follow-up for Phone Call Additional follow up Details #1::        will fill once and place at front desk but needs appt. before further refills Additional Follow-up by: Patria Mane  MD,  April 29, 2009 12:24 PM    New/Updated Medications: SYMBICORT 80-4.5 MCG/ACT  AERO (BUDESONIDE-FORMOTEROL FUMARATE) 1 puff inhaled two times a day Prescriptions: SYMBICORT 80-4.5 MCG/ACT  AERO (BUDESONIDE-FORMOTEROL FUMARATE) 1 puff inhaled two times a day  #1 x 1   Entered and Authorized by:   Patria Mane  MD   Signed by:   Patria Mane  MD on 04/29/2009   Method used:   Print then Give to Patient   [id number]  pateint notified of message from MD and advised to schedule appointment before the end of the year. Marcell Barlow RN  April 29, 2009 12:33 PM

## 2010-07-03 NOTE — Progress Notes (Signed)
  Phone Note Call from Patient   Caller: daughter- Summary of Call: nose bleeds for 2 hours- feeling weak.  Per daughter blood is running out of nose.  Advised her to take him to urgent care as we will not be available to see him until 1:30pm and if pt is feeling weak he should be seen before then.  Pt's daughter agreeable. Initial call taken by: AMY MARTIN RN,  March 20, 2008 11:52 AM

## 2010-07-03 NOTE — Assessment & Plan Note (Signed)
Summary: lower back/kidney pain,df   Vital Signs:  Patient profile:   70 year old male Height:      69.75 inches Weight:      229 pounds BMI:     33.21 Temp:     98.3 degrees F oral Pulse rate:   61 / minute BP sitting:   118 / 68  (left arm) Cuff size:   regular  Vitals Entered By: Schuyler Amor CMA (March 31, 2010 2:23 PM) CC: lower back pain x 2 weeks Is Patient Diabetic? No Pain Assessment Patient in pain? yes     Location: lower back Intensity: 7   Primary Care Provider:  Patria Mane  MD  CC:  lower back pain x 2 weeks.  History of Present Illness: 70 yo M:  1. Back Pain: Low back, R > L, x 2 weeks, no known injury - recent or remote, pain is ache and sharp at times, worse with flexion, better with rest, he has not tried any pain medication or other methods for relief, was worse over the weekend, but now improving. No radiation to legs, no bowel/bladder incontinence, no LE weakness/numbness/tingling.  2. Constipation: Chronic. Has Rx Miralax and Docusate but not taking regularly. Last BM x 3 days ago. Soft. No concern for hemorrhoids. No abdominal pain today. + "Low pressure." Denies N/V/D, melena, BRBPR, fever/chills.  Current Medications (verified): 1)  Lipitor 20 Mg Tabs (Atorvastatin Calcium) .Marland Kitchen.. 1 Tablet By Mouth Once A Day 2)  Nitroglycerin 0.4 Mg Subl (Nitroglycerin) .... Place 1 Tablet Under Tongue As Directed 3)  Plavix 75 Mg Tabs (Clopidogrel Bisulfate) .... Take 1 Tablet By Mouth Once A Day 4)  Toprol Xl 25 Mg Tb24 (Metoprolol Succinate) .... Take 1 Tablet By Mouth Once A Day 5)  Quinapril-Hydrochlorothiazide 20-25 Mg Tabs (Quinapril-Hydrochlorothiazide) .Marland Kitchen.. 1 By Mouth Once Daily For Blood Pressure (Replaces Old Hctz and Accupril) 6)  Symbicort 80-4.5 Mcg/act  Aero (Budesonide-Formoterol Fumarate) .Marland Kitchen.. 1 Puff Inhaled Two Times A Day.  Last Fills Before Visit With Doctor 7)  Miralax   Powd (Polyethylene Glycol 3350) .... Dissolve 1 Capful (17 Grams) in  8 Oz of Water 1-2 Times Daily As Needed For Constipation.  Disp 1 Large Bottle 8)  Aspirin 81 Mg Tbec (Aspirin) .... Daily 9)  Vitamin D (Ergocalciferol) 50000 Unit Caps (Ergocalciferol) .Marland Kitchen.. 1 By Mouth Qwk For 8 Weeks For Deficiency in Vitamin D 10)  Pepcid 20 Mg Tabs (Famotidine) .Marland Kitchen.. 1 By Mouth Two Times A Day  Allergies (verified): 1)  ! Pineapple Concentrate (Flavoring Agent)  Past History:  Past Medical History: -Needs SBE prophylaxis -AS -Coronary artery disease -Hyperlipidemia: FLP on 07/14: cholesterol 110, trig: 42, HDL: 35, LDL:67.  -Hypertension -COPD -Depression -GERD PMH-FH-SH reviewed for relevance  Review of Systems      See HPI  Physical Exam  General:  Alert, well-developed, and well-nourished.  NAD. Vitals reviewed. Lungs:  Normal respiratory effort.   Heart:  Normal rate, regular rhythm, no gallop, no rub, and no JVD.  Harsh 2/6 SEM heard throughout precordium that radiates to carotids bilaterally. Abdomen:  Protuberant. Soft, NT. Normal bowel sounds. Msk:  No obvious deformity. TTP along lumbar paraspinal mm - R > L. + hypertonic mm. Limited flexion and sidebending 2/2 pulling sensation. Negative SLR bilaterally. Normal hip ROM. No TTP SI joint.  Pulses:  R and L dorsalis pedis and posterior tibial pulses are full and equal bilaterally. Extremities:  No clubbing, cyanosis, edema. Neurologic:  Alert & oriented X3, strength  normal in all extremities, sensation intact to light touch, and DTRs symmetrical and normal.     Impression & Recommendations:  Problem # 1:  BACK PAIN (ICD-724.5) Assessment New  No Red Flags. Likely 2/2 DJD and muscle spasm. Reviewed muscle stretches. Advised no NSAIDs 2/2 CAD/HTN. Rx Tylenol as needed or pain. Patient to call if pain is not controlled - "I don't like to take medicines."  His updated medication list for this problem includes:    Aspirin 81 Mg Tbec (Aspirin) .Marland Kitchen... Daily  Orders: [place]- Est  Level 4 (99214)  Problem  # 2:  CONSTIPATION (ICD-564.00) Assessment: Deteriorated  Recommended Miralax and Colace daily. Discussed iber sources. Red Flags given. Recent colonoscopy last year - due in 5 years. His updated medication list for this problem includes:    Miralax Powd (Polyethylene glycol 3350) .Marland Kitchen... Dissolve 1 capful (17 grams) in 8 oz of water 1-2 times daily as needed for constipation.  disp 1 large bottle  Orders: [place]- Est  Level 4 (99214)  Complete Medication List: 1)  Lipitor 20 Mg Tabs (Atorvastatin calcium) .Marland Kitchen.. 1 tablet by mouth once a day 2)  Nitroglycerin 0.4 Mg Subl (Nitroglycerin) .... Place 1 tablet under tongue as directed 3)  Plavix 75 Mg Tabs (Clopidogrel bisulfate) .... Take 1 tablet by mouth once a day 4)  Toprol Xl 25 Mg Tb24 (Metoprolol succinate) .... Take 1 tablet by mouth once a day 5)  Quinapril-hydrochlorothiazide 20-25 Mg Tabs (Quinapril-hydrochlorothiazide) .Marland Kitchen.. 1 by mouth once daily for blood pressure (replaces old hctz and accupril) 6)  Symbicort 80-4.5 Mcg/act Aero (Budesonide-formoterol fumarate) .Marland Kitchen.. 1 puff inhaled two times a day.  last fills before visit with doctor 7)  Miralax Powd (Polyethylene glycol 3350) .... Dissolve 1 capful (17 grams) in 8 oz of water 1-2 times daily as needed for constipation.  disp 1 large bottle 8)  Aspirin 81 Mg Tbec (Aspirin) .... Daily 9)  Vitamin D (ergocalciferol) 50000 Unit Caps (Ergocalciferol) .Marland Kitchen.. 1 by mouth qwk for 8 weeks for deficiency in vitamin d 10)  Pepcid 20 Mg Tabs (Famotidine) .Marland Kitchen.. 1 by mouth two times a day  Patient Instructions: 1)  It was nice to meet you today. 2)  Your low back pain is likely from arthritis and a muscle spasm.  3)  Continue to move! 4)  You may take Tylenol 650 mg by mouth q 6 hours as needed for pain. 5)  Restart the Miralax for your constipation. 6)  Follow up with your PCP.   Orders Added: 1)  [place]- Est  Level 4 [id number]

## 2010-07-03 NOTE — Procedures (Signed)
Summary: Colonoscopy  Hardcopy in MD box.  Appended Document: Colonoscopy Two 4 to 6 mm polyps in sigmoid colon and proximal ascending colon removed, benign tublular adema and tubulovillousa adenoma.  Awaiting further recs from Dr. Amedeo Plenty as to when colonscopy needs to be repeated.   Clinical Lists Changes  Observations: Added new observation of COLONNXTDUE: 02/09/2018 (02/29/2008 8:19) Added new observation of LST COLON DT: 02/10/2008 (02/10/2008 8:21) Added new observation of COLONOSCOPY: abnormal (02/10/2008 8:21)      Colonoscopy Result Date:  02/10/2008 Colonoscopy Result:  abnormal

## 2010-07-11 NOTE — Procedures (Signed)
NAME:  Matthew Edwards, Matthew Edwards NO.:  [id number]  MEDICAL RECORD NO.:  [id number]          PATIENT TYPE:  OIB  LOCATION:  2899                         FACILITY:  [facility]  PHYSICIAN:  Quay Burow, M.D.   DATE OF BIRTH:  27-Sep-1940  DATE OF PROCEDURE:  06/30/2010 DATE OF DISCHARGE:  06/30/2010                           CARDIAC CATHETERIZATION   PROCEDURE:  Right and left heart catheterization.  OPERATOR:  Quay Burow, MD  INDICATIONS FOR PROCEDURE:  Matthew Edwards is a very pleasant 70 year old mildly overweight separated African American male, father of 29, grandfather of three grandchildren who I just saw in the office on June 19, 2010.  He has a history of CAD status post stenting of his LAD and circumflex by myself in 2006.  He does have moderate-to-severe aortic stenosis with an valve area of 1 cm2 a year ago and currently about 0.83 cm2.  He was recently seen in the ER for dizziness.  He presents now for right and left heart cath to define his anatomy and physiology.  DESCRIPTION OF PROCEDURE:  The patient was brought to the second floor  cardiac cath lab in a postabsorptive state.  He was premedicated with p.o. Valium.  His right groin was prepped and shaved in the usual sterile fashion.  Xylocaine 1% was used for local anesthesia.  A 6-French sheath was inserted into the right femoral artery using standard Seldinger technique.  A 7-French sheath was inserted into the right femoral vein.  A 7-French balloon-tipped thermodilution Swan-Ganz catheter was used to obtain sequential heart pressures, Fick and thermodilution cardiac outputs.  A 5-French right and left Judkins diagnostic catheters as well as a 5-French pigtail catheter were used for selective coronary angiography and supravalvular aortography.  Visipaque dye was used for the entirety of the case. Retrograde aortic, left ventricular, and pullback heart pressures  were recorded.  HEMODYNAMICS: 1. Aortic systolic pressure [id number], diastolic pressure 74. 2. Left ventricular systolic pressure [id number], end-diastolic pressure 8. 3. RA pressure:  A-wave 16, V-wave 13, mean 14. 4. Right ventricular pressure:  Systolic 34 and diastolic pressure 7. 5. Pulmonary artery pressure:  Systolic 41, diastolic pressure 27, and     mean 32. 6. Pulmonary capillary wedge pressure:  A-wave 20, V-wave 29, and mean     28. 7. Cardiac output by Fick was 4.5 L/min with an index of 2.4 L/min/m2     and by thermodilution was 3.3 L/min with an index of 1.4 L/min/m2.     The peak-to-peak gradient was measured between 55-65 mmHg.  The     valve area was calculated at 0.95 cm2 using Fick cardiac output and     0.56 by thermodilution.  SELECTIVE CORONARY ANGIOGRAPHY: 1. Left main normal. 2. LAD; the mid-LAD stent was widely patent.  There was a 40% stenosis     just proximal to the stent. 3. Left circumflex; the mid AV groove circumflex stent was widely     patent. 4. Right coronary; this was a dominant vessel and was free of     significant disease. 5. Supravalvular aortography; supravalvar aortogram was performed  using 20 mL of Visipaque dye at 20 mL per second.  There was no     aortic insufficiency noted.  The supravalvular ascending thoracic     aorta was not aneurysmal.  IMPRESSION:  Matthew Edwards has at least moderate-to-moderately severe aortic stenosis and noncritical coronary artery disease.  We will discharge him home today as an outpatient.  I will see him back in the office in 1-2 weeks followup at which time we will discuss the possibility of aortic valve replacement.     Quay Burow, M.D.     Matthew Edwards  D:  06/30/2010  T:  07/01/2010  Job:  [id number]  cc:   Matthew Edwards Matthew Mane, MD  Electronically Signed by Quay Burow M.D. on 07/11/2010 08:11:57 AM

## 2010-07-22 ENCOUNTER — Encounter (INDEPENDENT_AMBULATORY_CARE_PROVIDER_SITE_OTHER): Payer: Medicare Other | Admitting: Surgery

## 2010-07-22 DIAGNOSIS — I2510 Atherosclerotic heart disease of native coronary artery without angina pectoris: Secondary | ICD-10-CM

## 2010-07-22 DIAGNOSIS — I359 Nonrheumatic aortic valve disorder, unspecified: Secondary | ICD-10-CM
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
## 2010-07-24 ENCOUNTER — Ambulatory Visit (HOSPITAL_COMMUNITY): Payer: Medicare Other

## 2010-07-24 ENCOUNTER — Encounter (HOSPITAL_COMMUNITY)
Admission: RE | Admit: 2010-07-24 | Discharge: 2010-07-24 | Disposition: A | Discharge status: Home / Self Care | Payer: Medicare Other | Source: Ambulatory Visit | Attending: Surgery | Admitting: Surgery

## 2010-07-24 ENCOUNTER — Other Ambulatory Visit: Payer: Self-pay | Admitting: Surgery

## 2010-07-24 DIAGNOSIS — Z01812 Encounter for preprocedural laboratory examination: Secondary | ICD-10-CM | POA: Insufficient documentation

## 2010-07-24 DIAGNOSIS — Z01811 Encounter for preprocedural respiratory examination: Secondary | ICD-10-CM

## 2010-07-24 DIAGNOSIS — I359 Nonrheumatic aortic valve disorder, unspecified: Secondary | ICD-10-CM

## 2010-07-24 DIAGNOSIS — Z01818 Encounter for other preprocedural examination: Secondary | ICD-10-CM | POA: Insufficient documentation

## 2010-07-24 DIAGNOSIS — Z01810 Encounter for preprocedural cardiovascular examination: Secondary | ICD-10-CM

## 2010-07-24 LAB — URINALYSIS, ROUTINE W REFLEX MICROSCOPIC
Bilirubin Urine: NEGATIVE
Hgb urine dipstick: NEGATIVE
Ketones, ur: NEGATIVE mg/dL
Nitrite: NEGATIVE
Protein, ur: NEGATIVE mg/dL
Specific Gravity, Urine: 1.019 (ref 1.005–1.030)
Urine Glucose, Fasting: NEGATIVE mg/dL
Urobilinogen, UA: 1 mg/dL (ref 0.0–1.0)
pH: 7.5 (ref 5.0–8.0)

## 2010-07-24 LAB — BLOOD GAS, ARTERIAL
Acid-Base Excess: 4 mmol/L — ABNORMAL HIGH (ref 0.0–2.0)
Bicarbonate: 28 mEq/L — ABNORMAL HIGH (ref 20.0–24.0)
Drawn by: 206361
FIO2: 0.21 %
O2 Saturation: 92.4 %
Patient temperature: 98.6
TCO2: 29.3 mmol/L (ref 0–100)
pCO2 arterial: 41.8 mmHg (ref 35.0–45.0)
pH, Arterial: 7.441 (ref 7.350–7.450)
pO2, Arterial: 63.4 mmHg — ABNORMAL LOW (ref 80.0–100.0)

## 2010-07-24 LAB — COMPREHENSIVE METABOLIC PANEL
ALT: 16 U/L (ref 0–53)
AST: 25 U/L (ref 0–37)
Albumin: 4.5 g/dL (ref 3.5–5.2)
Alkaline Phosphatase: 54 U/L (ref 39–117)
BUN: 16 mg/dL (ref 6–23)
CO2: 24 mEq/L (ref 19–32)
Calcium: 11.1 mg/dL — ABNORMAL HIGH (ref 8.4–10.5)
Chloride: 105 mEq/L (ref 96–112)
Creatinine, Ser: 1.16 mg/dL (ref 0.4–1.5)
GFR calc Af Amer: >60 mL/min (ref >60)
GFR calc non Af Amer: >60 mL/min (ref >60)
Glucose, Bld: 108 mg/dL — ABNORMAL HIGH (ref 70–99)
Potassium: 4.7 mEq/L (ref 3.5–5.1)
Sodium: 138 mEq/L (ref 135–145)
Total Bilirubin: 0.8 mg/dL (ref 0.3–1.2)
Total Protein: 7.5 g/dL (ref 6.0–8.3)

## 2010-07-24 LAB — TYPE AND SCREEN
ABO/RH(D): O POS
Antibody Screen: NEGATIVE

## 2010-07-24 LAB — CBC
HCT: 46.8 % (ref 39.0–52.0)
Hemoglobin: 15.8 g/dL (ref 13.0–17.0)
MCH: 29.5 pg (ref 26.0–34.0)
MCHC: 33.8 g/dL (ref 30.0–36.0)
MCV: 87.3 fL (ref 78.0–100.0)
Platelets: 150 10*3/uL (ref 150–400)
RBC: 5.36 MIL/uL (ref 4.22–5.81)
RDW: 12.9 % (ref 11.5–15.5)
WBC: 5.6 10*3/uL (ref 4.0–10.5)

## 2010-07-24 LAB — PROTIME-INR
INR: 1.02 (ref 0.00–1.49)
Prothrombin Time: 13.6 seconds (ref 11.6–15.2)

## 2010-07-24 LAB — HEMOGLOBIN A1C
Hgb A1c MFr Bld: 6.1 % — ABNORMAL HIGH (ref <5.7)
Mean Plasma Glucose: 128 mg/dL — ABNORMAL HIGH (ref <117)

## 2010-07-24 LAB — SURGICAL PCR SCREEN
MRSA, PCR: NEGATIVE
Staphylococcus aureus: NEGATIVE

## 2010-07-24 LAB — APTT: aPTT: 29 seconds (ref 24–37)

## 2010-07-24 LAB — ABO/RH: ABO/RH(D): O POS

## 2010-07-28 ENCOUNTER — Inpatient Hospital Stay (HOSPITAL_COMMUNITY): Payer: Medicare Other

## 2010-07-28 ENCOUNTER — Other Ambulatory Visit: Payer: Self-pay | Admitting: Surgery

## 2010-07-28 ENCOUNTER — Inpatient Hospital Stay (HOSPITAL_COMMUNITY)
Admission: RE | Admit: 2010-07-28 | Discharge: 2010-08-03 | DRG: 220 | Disposition: A | Discharge status: Home / Self Care | Payer: Medicare Other | Source: Ambulatory Visit | Attending: Surgery | Admitting: Surgery

## 2010-07-28 DIAGNOSIS — E785 Hyperlipidemia, unspecified: Secondary | ICD-10-CM | POA: Diagnosis present

## 2010-07-28 DIAGNOSIS — I2510 Atherosclerotic heart disease of native coronary artery without angina pectoris: Secondary | ICD-10-CM

## 2010-07-28 DIAGNOSIS — Z7982 Long term (current) use of aspirin: Secondary | ICD-10-CM

## 2010-07-28 DIAGNOSIS — F3289 Other specified depressive episodes: Secondary | ICD-10-CM | POA: Diagnosis present

## 2010-07-28 DIAGNOSIS — I359 Nonrheumatic aortic valve disorder, unspecified: Secondary | ICD-10-CM

## 2010-07-28 DIAGNOSIS — F329 Major depressive disorder, single episode, unspecified: Secondary | ICD-10-CM | POA: Diagnosis present

## 2010-07-28 DIAGNOSIS — J449 Chronic obstructive pulmonary disease, unspecified: Secondary | ICD-10-CM | POA: Diagnosis present

## 2010-07-28 DIAGNOSIS — I10 Essential (primary) hypertension: Secondary | ICD-10-CM | POA: Diagnosis present

## 2010-07-28 DIAGNOSIS — E119 Type 2 diabetes mellitus without complications: Secondary | ICD-10-CM | POA: Diagnosis present

## 2010-07-28 DIAGNOSIS — J4489 Other specified chronic obstructive pulmonary disease: Secondary | ICD-10-CM | POA: Diagnosis present

## 2010-07-28 DIAGNOSIS — D62 Acute posthemorrhagic anemia: Secondary | ICD-10-CM | POA: Diagnosis not present

## 2010-07-28 DIAGNOSIS — E669 Obesity, unspecified: Secondary | ICD-10-CM | POA: Diagnosis present

## 2010-07-28 DIAGNOSIS — I4892 Unspecified atrial flutter: Secondary | ICD-10-CM | POA: Diagnosis not present

## 2010-07-28 DIAGNOSIS — I4891 Unspecified atrial fibrillation: Secondary | ICD-10-CM | POA: Diagnosis not present

## 2010-07-28 DIAGNOSIS — K219 Gastro-esophageal reflux disease without esophagitis: Secondary | ICD-10-CM | POA: Diagnosis present

## 2010-07-28 DIAGNOSIS — Z7902 Long term (current) use of antithrombotics/antiplatelets: Secondary | ICD-10-CM

## 2010-07-28 LAB — CBC
HCT: 36.1 % — ABNORMAL LOW (ref 39.0–52.0)
HCT: 31.7 % — ABNORMAL LOW (ref 39.0–52.0)
Hemoglobin: 12.2 g/dL — ABNORMAL LOW (ref 13.0–17.0)
Hemoglobin: 10.5 g/dL — ABNORMAL LOW (ref 13.0–17.0)
MCH: 29.3 pg (ref 26.0–34.0)
MCH: 29 pg (ref 26.0–34.0)
MCHC: 33.8 g/dL (ref 30.0–36.0)
MCHC: 33.1 g/dL (ref 30.0–36.0)
MCV: 86.8 fL (ref 78.0–100.0)
MCV: 87.6 fL (ref 78.0–100.0)
Platelets: 102 10*3/uL — ABNORMAL LOW (ref 150–400)
Platelets: 103 10*3/uL — ABNORMAL LOW (ref 150–400)
RBC: 4.16 MIL/uL — ABNORMAL LOW (ref 4.22–5.81)
RBC: 3.62 MIL/uL — ABNORMAL LOW (ref 4.22–5.81)
RDW: 13 % (ref 11.5–15.5)
RDW: 13.2 % (ref 11.5–15.5)
WBC: 11 10*3/uL — ABNORMAL HIGH (ref 4.0–10.5)
WBC: 9.8 10*3/uL (ref 4.0–10.5)

## 2010-07-28 LAB — POCT I-STAT, CHEM 8
BUN: 18 mg/dL (ref 6–23)
Calcium, Ion: 1.22 mmol/L (ref 1.12–1.32)
Chloride: 106 mEq/L (ref 96–112)
Creatinine, Ser: 1.2 mg/dL (ref 0.4–1.5)
Glucose, Bld: 151 mg/dL — ABNORMAL HIGH (ref 70–99)
HCT: 30 % — ABNORMAL LOW (ref 39.0–52.0)
Hemoglobin: 10.2 g/dL — ABNORMAL LOW (ref 13.0–17.0)
Potassium: 5 mEq/L (ref 3.5–5.1)
Sodium: 141 mEq/L (ref 135–145)
TCO2: 25 mmol/L (ref 0–100)

## 2010-07-28 LAB — POCT I-STAT 3, ART BLOOD GAS (G3+)
Acid-base deficit: 2 mmol/L (ref 0.0–2.0)
Bicarbonate: 24.3 mEq/L — ABNORMAL HIGH (ref 20.0–24.0)
O2 Saturation: 96 %
Patient temperature: 34.9
TCO2: 26 mmol/L (ref 0–100)
pCO2 arterial: 44.5 mmHg (ref 35.0–45.0)
pH, Arterial: 7.334 — ABNORMAL LOW (ref 7.350–7.450)
pO2, Arterial: 79 mmHg — ABNORMAL LOW (ref 80.0–100.0)

## 2010-07-28 LAB — HEMOGLOBIN AND HEMATOCRIT, BLOOD
HCT: 29.7 % — ABNORMAL LOW (ref 39.0–52.0)
Hemoglobin: 9.9 g/dL — ABNORMAL LOW (ref 13.0–17.0)

## 2010-07-28 LAB — MAGNESIUM: Magnesium: 3.2 mg/dL — ABNORMAL HIGH (ref 1.5–2.5)

## 2010-07-28 LAB — PLATELET COUNT: Platelets: 97 10*3/uL — ABNORMAL LOW (ref 150–400)

## 2010-07-28 LAB — APTT: aPTT: 50 seconds — ABNORMAL HIGH (ref 24–37)

## 2010-07-28 LAB — CREATININE, SERUM
Creatinine, Ser: 1.18 mg/dL (ref 0.4–1.5)
GFR calc Af Amer: >60 mL/min (ref >60)
GFR calc non Af Amer: >60 mL/min (ref >60)

## 2010-07-28 LAB — GLUCOSE, CAPILLARY
Glucose-Capillary: 108 mg/dL — ABNORMAL HIGH (ref 70–99)
Glucose-Capillary: 106 mg/dL — ABNORMAL HIGH (ref 70–99)
Glucose-Capillary: 133 mg/dL — ABNORMAL HIGH (ref 70–99)
Glucose-Capillary: 161 mg/dL — ABNORMAL HIGH (ref 70–99)
Glucose-Capillary: 137 mg/dL — ABNORMAL HIGH (ref 70–99)

## 2010-07-28 LAB — POCT I-STAT 4, (NA,K, GLUC, HGB,HCT)
Glucose, Bld: 117 mg/dL — ABNORMAL HIGH (ref 70–99)
HCT: 36 % — ABNORMAL LOW (ref 39.0–52.0)
Hemoglobin: 12.2 g/dL — ABNORMAL LOW (ref 13.0–17.0)
Potassium: 4.4 mEq/L (ref 3.5–5.1)
Sodium: 139 mEq/L (ref 135–145)

## 2010-07-28 LAB — PROTIME-INR
INR: 1.51 — ABNORMAL HIGH (ref 0.00–1.49)
Prothrombin Time: 18.4 seconds — ABNORMAL HIGH (ref 11.6–15.2)

## 2010-07-29 ENCOUNTER — Inpatient Hospital Stay (HOSPITAL_COMMUNITY): Payer: Medicare Other

## 2010-07-29 DIAGNOSIS — E1165 Type 2 diabetes mellitus with hyperglycemia: Secondary | ICD-10-CM

## 2010-07-29 LAB — POCT I-STAT, CHEM 8
BUN: 18 mg/dL (ref 6–23)
Calcium, Ion: 1.25 mmol/L (ref 1.12–1.32)
Chloride: 103 mEq/L (ref 96–112)
Creatinine, Ser: 1.3 mg/dL (ref 0.4–1.5)
Glucose, Bld: 146 mg/dL — ABNORMAL HIGH (ref 70–99)
HCT: 31 % — ABNORMAL LOW (ref 39.0–52.0)
Hemoglobin: 10.5 g/dL — ABNORMAL LOW (ref 13.0–17.0)
Potassium: 4.2 mEq/L (ref 3.5–5.1)
Sodium: 140 mEq/L (ref 135–145)
TCO2: 25 mmol/L (ref 0–100)

## 2010-07-29 LAB — CREATININE, SERUM
Creatinine, Ser: 1.39 mg/dL (ref 0.4–1.5)
GFR calc Af Amer: >60 mL/min (ref >60)
GFR calc non Af Amer: 51 mL/min — ABNORMAL LOW (ref >60)

## 2010-07-29 LAB — POCT I-STAT 4, (NA,K, GLUC, HGB,HCT)
Glucose, Bld: 119 mg/dL — ABNORMAL HIGH (ref 70–99)
Glucose, Bld: 126 mg/dL — ABNORMAL HIGH (ref 70–99)
Glucose, Bld: 105 mg/dL — ABNORMAL HIGH (ref 70–99)
Glucose, Bld: 110 mg/dL — ABNORMAL HIGH (ref 70–99)
Glucose, Bld: 128 mg/dL — ABNORMAL HIGH (ref 70–99)
HCT: 41 % (ref 39.0–52.0)
HCT: 38 % — ABNORMAL LOW (ref 39.0–52.0)
HCT: 27 % — ABNORMAL LOW (ref 39.0–52.0)
HCT: 28 % — ABNORMAL LOW (ref 39.0–52.0)
HCT: 31 % — ABNORMAL LOW (ref 39.0–52.0)
Hemoglobin: 13.9 g/dL (ref 13.0–17.0)
Hemoglobin: 12.9 g/dL — ABNORMAL LOW (ref 13.0–17.0)
Hemoglobin: 9.2 g/dL — ABNORMAL LOW (ref 13.0–17.0)
Hemoglobin: 9.5 g/dL — ABNORMAL LOW (ref 13.0–17.0)
Hemoglobin: 10.5 g/dL — ABNORMAL LOW (ref 13.0–17.0)
Potassium: 4.4 mEq/L (ref 3.5–5.1)
Potassium: 4.1 mEq/L (ref 3.5–5.1)
Potassium: 4 mEq/L (ref 3.5–5.1)
Potassium: 5.6 mEq/L — ABNORMAL HIGH (ref 3.5–5.1)
Potassium: 4.6 mEq/L (ref 3.5–5.1)
Sodium: 138 mEq/L (ref 135–145)
Sodium: 138 mEq/L (ref 135–145)
Sodium: 136 mEq/L (ref 135–145)
Sodium: 135 mEq/L (ref 135–145)
Sodium: 135 mEq/L (ref 135–145)

## 2010-07-29 LAB — BASIC METABOLIC PANEL
BUN: 15 mg/dL (ref 6–23)
CO2: 24 mEq/L (ref 19–32)
Calcium: 8.8 mg/dL (ref 8.4–10.5)
Chloride: 111 mEq/L (ref 96–112)
Creatinine, Ser: 1.26 mg/dL (ref 0.4–1.5)
GFR calc Af Amer: >60 mL/min (ref >60)
GFR calc non Af Amer: 57 mL/min — ABNORMAL LOW (ref >60)
Glucose, Bld: 149 mg/dL — ABNORMAL HIGH (ref 70–99)
Potassium: 4.7 mEq/L (ref 3.5–5.1)
Sodium: 141 mEq/L (ref 135–145)

## 2010-07-29 LAB — CBC
HCT: 31.7 % — ABNORMAL LOW (ref 39.0–52.0)
HCT: 30.1 % — ABNORMAL LOW (ref 39.0–52.0)
Hemoglobin: 10.4 g/dL — ABNORMAL LOW (ref 13.0–17.0)
Hemoglobin: 9.8 g/dL — ABNORMAL LOW (ref 13.0–17.0)
MCH: 28.8 pg (ref 26.0–34.0)
MCH: 28.7 pg (ref 26.0–34.0)
MCHC: 32.8 g/dL (ref 30.0–36.0)
MCHC: 32.6 g/dL (ref 30.0–36.0)
MCV: 87.8 fL (ref 78.0–100.0)
MCV: 88 fL (ref 78.0–100.0)
Platelets: 105 10*3/uL — ABNORMAL LOW (ref 150–400)
Platelets: 97 10*3/uL — ABNORMAL LOW (ref 150–400)
RBC: 3.61 MIL/uL — ABNORMAL LOW (ref 4.22–5.81)
RBC: 3.42 MIL/uL — ABNORMAL LOW (ref 4.22–5.81)
RDW: 13.4 % (ref 11.5–15.5)
RDW: 13.5 % (ref 11.5–15.5)
WBC: 9.1 10*3/uL (ref 4.0–10.5)
WBC: 10.4 10*3/uL (ref 4.0–10.5)

## 2010-07-29 LAB — POCT I-STAT 3, ART BLOOD GAS (G3+)
Acid-Base Excess: 2 mmol/L (ref 0.0–2.0)
Acid-base deficit: 2 mmol/L (ref 0.0–2.0)
Bicarbonate: 26.9 mEq/L — ABNORMAL HIGH (ref 20.0–24.0)
Bicarbonate: 23.8 mEq/L (ref 20.0–24.0)
O2 Saturation: 100 %
O2 Saturation: 94 %
Patient temperature: 98
TCO2: 28 mmol/L (ref 0–100)
TCO2: 25 mmol/L (ref 0–100)
pCO2 arterial: 43.7 mmHg (ref 35.0–45.0)
pCO2 arterial: 44.7 mmHg (ref 35.0–45.0)
pH, Arterial: 7.397 (ref 7.350–7.450)
pH, Arterial: 7.333 — ABNORMAL LOW (ref 7.350–7.450)
pO2, Arterial: 329 mmHg — ABNORMAL HIGH (ref 80.0–100.0)
pO2, Arterial: 76 mmHg — ABNORMAL LOW (ref 80.0–100.0)

## 2010-07-29 LAB — PREPARE PLATELETS: Unit division: 0

## 2010-07-29 LAB — GLUCOSE, CAPILLARY
Glucose-Capillary: 131 mg/dL — ABNORMAL HIGH (ref 70–99)
Glucose-Capillary: 136 mg/dL — ABNORMAL HIGH (ref 70–99)
Glucose-Capillary: 138 mg/dL — ABNORMAL HIGH (ref 70–99)
Glucose-Capillary: 148 mg/dL — ABNORMAL HIGH (ref 70–99)
Glucose-Capillary: 152 mg/dL — ABNORMAL HIGH (ref 70–99)
Glucose-Capillary: 178 mg/dL — ABNORMAL HIGH (ref 70–99)

## 2010-07-29 LAB — MAGNESIUM
Magnesium: 2.7 mg/dL — ABNORMAL HIGH (ref 1.5–2.5)
Magnesium: 2.7 mg/dL — ABNORMAL HIGH (ref 1.5–2.5)

## 2010-07-30 ENCOUNTER — Inpatient Hospital Stay (HOSPITAL_COMMUNITY): Payer: Medicare Other

## 2010-07-30 LAB — GLUCOSE, CAPILLARY
Glucose-Capillary: 125 mg/dL — ABNORMAL HIGH (ref 70–99)
Glucose-Capillary: 129 mg/dL — ABNORMAL HIGH (ref 70–99)
Glucose-Capillary: 134 mg/dL — ABNORMAL HIGH (ref 70–99)
Glucose-Capillary: 136 mg/dL — ABNORMAL HIGH (ref 70–99)
Glucose-Capillary: 138 mg/dL — ABNORMAL HIGH (ref 70–99)
Glucose-Capillary: 139 mg/dL — ABNORMAL HIGH (ref 70–99)

## 2010-07-30 LAB — CBC
HCT: 29.1 % — ABNORMAL LOW (ref 39.0–52.0)
Hemoglobin: 9.6 g/dL — ABNORMAL LOW (ref 13.0–17.0)
MCH: 29.2 pg (ref 26.0–34.0)
MCHC: 33 g/dL (ref 30.0–36.0)
MCV: 88.4 fL (ref 78.0–100.0)
Platelets: 94 10*3/uL — ABNORMAL LOW (ref 150–400)
RBC: 3.29 MIL/uL — ABNORMAL LOW (ref 4.22–5.81)
RDW: 13.6 % (ref 11.5–15.5)
WBC: 10.2 10*3/uL (ref 4.0–10.5)

## 2010-07-30 LAB — BASIC METABOLIC PANEL
BUN: 18 mg/dL (ref 6–23)
CO2: 27 mEq/L (ref 19–32)
Calcium: 9 mg/dL (ref 8.4–10.5)
Chloride: 104 mEq/L (ref 96–112)
Creatinine, Ser: 1.31 mg/dL (ref 0.4–1.5)
GFR calc Af Amer: >60 mL/min (ref >60)
GFR calc non Af Amer: 55 mL/min — ABNORMAL LOW (ref >60)
Glucose, Bld: 123 mg/dL — ABNORMAL HIGH (ref 70–99)
Potassium: 3.8 mEq/L (ref 3.5–5.1)
Sodium: 138 mEq/L (ref 135–145)

## 2010-07-31 LAB — GLUCOSE, CAPILLARY
Glucose-Capillary: 127 mg/dL — ABNORMAL HIGH (ref 70–99)
Glucose-Capillary: 158 mg/dL — ABNORMAL HIGH (ref 70–99)
Glucose-Capillary: 123 mg/dL — ABNORMAL HIGH (ref 70–99)
Glucose-Capillary: 145 mg/dL — ABNORMAL HIGH (ref 70–99)

## 2010-08-01 DIAGNOSIS — E1165 Type 2 diabetes mellitus with hyperglycemia: Secondary | ICD-10-CM

## 2010-08-01 LAB — GLUCOSE, CAPILLARY
Glucose-Capillary: 99 mg/dL (ref 70–99)
Glucose-Capillary: 74 mg/dL (ref 70–99)
Glucose-Capillary: 109 mg/dL — ABNORMAL HIGH (ref 70–99)

## 2010-08-01 LAB — BASIC METABOLIC PANEL
BUN: 19 mg/dL (ref 6–23)
CO2: 29 mEq/L (ref 19–32)
Calcium: 9.5 mg/dL (ref 8.4–10.5)
Chloride: 105 mEq/L (ref 96–112)
Creatinine, Ser: 1.1 mg/dL (ref 0.4–1.5)
GFR calc Af Amer: >60 mL/min (ref >60)
GFR calc non Af Amer: >60 mL/min (ref >60)
Glucose, Bld: 61 mg/dL — ABNORMAL LOW (ref 70–99)
Potassium: 3.9 mEq/L (ref 3.5–5.1)
Sodium: 139 mEq/L (ref 135–145)

## 2010-08-02 LAB — GLUCOSE, CAPILLARY
Glucose-Capillary: 184 mg/dL — ABNORMAL HIGH (ref 70–99)
Glucose-Capillary: 118 mg/dL — ABNORMAL HIGH (ref 70–99)
Glucose-Capillary: 61 mg/dL — ABNORMAL LOW (ref 70–99)
Glucose-Capillary: 79 mg/dL (ref 70–99)
Glucose-Capillary: 104 mg/dL — ABNORMAL HIGH (ref 70–99)
Glucose-Capillary: 124 mg/dL — ABNORMAL HIGH (ref 70–99)

## 2010-08-03 LAB — BASIC METABOLIC PANEL
BUN: 15 mg/dL (ref 6–23)
CO2: 27 mEq/L (ref 19–32)
Calcium: 9.7 mg/dL (ref 8.4–10.5)
Chloride: 101 mEq/L (ref 96–112)
Creatinine, Ser: 1.15 mg/dL (ref 0.4–1.5)
GFR calc Af Amer: >60 mL/min (ref >60)
GFR calc non Af Amer: >60 mL/min (ref >60)
Glucose, Bld: 135 mg/dL — ABNORMAL HIGH (ref 70–99)
Potassium: 4.6 mEq/L (ref 3.5–5.1)
Sodium: 135 mEq/L (ref 135–145)

## 2010-08-03 LAB — GLUCOSE, CAPILLARY
Glucose-Capillary: 128 mg/dL — ABNORMAL HIGH (ref 70–99)
Glucose-Capillary: 56 mg/dL — ABNORMAL LOW (ref 70–99)
Glucose-Capillary: 92 mg/dL (ref 70–99)

## 2010-08-03 NOTE — Op Note (Signed)
NAME:  Matthew Edwards, Matthew Edwards NO.:  [id number]  MEDICAL RECORD NO.:  [id number]           PATIENT TYPE:  I  LOCATION:  2314                         FACILITY:  [facility]  PHYSICIAN:  Soledad Gerlach, MDDATE OF BIRTH:  Apr 08, 1941  DATE OF PROCEDURE:  07/28/2010 DATE OF DISCHARGE:                              OPERATIVE REPORT   INTRAOPERATIVE ECHOCARDIOGRAM  PROCEDURE:  Transesophageal echocardiogram.  INDICATION:  Aortic valve replacement.  DESCRIPTION:  [name] is a 70 year old gentleman with history of aortic stenosis and coronary artery disease who was brought to the operating room today by Dr. Cyndia Bent for an aortic valve replacement and one-vessel bypass.  Intraoperative echocardiogram was requested to evaluate the valvular repair as well as look for any other valvular abnormalities. The patient was brought to the operating room and placed under general anesthesia.  After confirmation of endotracheal tube placement and orogastric suctioning, a transesophageal echo probe was placed into the patient's esophagus without complication.  The left ventricle was assessed first.  The ventricle was of normal thickness.  There was vigorous wall motion throughout without any wall motion abnormalities. The estimated ejection fraction was 65%.  The mitral valve was imaged next.  There did appear to be some billowing of the anterior leaflet but the leaflets did appear to coapt well. During analysis with color Doppler, a mild amount of central mitral regurgitation was noted.  There were no calcifications or vegetations seen on the mitral valve.  The aortic valve was imaged next and revealed a severely calcified valve. We were unable to measure aortic valve area by planimetry due to the heavy calcifications and poor windows.  Using the deep transgastric view of the aortic valve, we were able calculate an aortic valve area of 0.8 cm2.  In addition, there was a moderate to severe  amount of aortic insufficiency noted.  The pulmonic valve was difficult to see on planimetry and did not appear to have any pulmonic regurgitation using color analysis.  The tricuspid valve was normal in both structure and function.  The interatrial septum appeared to be intact.  The descending aorta and aortic arch appeared to be free from atherosclerotic disease.  Upon weaning from bypass, the heart was again evaluated.  The tissue valve had been placed into the aortic position.  There was no aortic insufficiency noted.  The valve leaflets were difficult to visualize secondary to shadowing and so this could not be completely assessed. The mitral regurgitation that was noted in the pre-bypass evaluation had reduced to a trace to mild amount of regurgitation when coming off bypass.  The left ventricular function remained vigorous without any inotropic support.  The patient was weaned from bypass without complication and at the end of the procedure, the transesophageal echo probe was removed without any evidence of trauma. At the conclusion of the procedure, the patient was taken directly from the operating room to the ICU in stable condition.          ______________________________ Soledad Gerlach, MD     WEF/MEDQ  D:  07/28/2010  T:  07/29/2010  Job:  [id number]  cc:  Anesthesia Leisure centre manager by W. Kegan Shepardson MD on 08/03/2010 09:44:02 AM

## 2010-08-04 ENCOUNTER — Telehealth: Payer: Self-pay | Admitting: Family Medicine

## 2010-08-04 NOTE — Telephone Encounter (Signed)
Consulted with Dr.Breen . Will follow up at visit which is scheduled.

## 2010-08-04 NOTE — Telephone Encounter (Signed)
Got out of hosp yesterday and is a new DM.  His sugars an going up and needs to talk to nurse

## 2010-08-04 NOTE — Telephone Encounter (Signed)
Spoke with daughter and she states patient  was discharged yesterday and they were given  a range for BS of 70 -120. Fasting BS this AM was 144. 30-45 minutes later was 209 and she is concerned with readings. She checked BS 6 times yesterday.  Was told at discharge to check before meals. Advised  to keep record of readings before meals and although 144 is higher than desired  not in a danger zone . Advised to follow diet plan he was given . Has appointment on 08/06/2010.

## 2010-08-06 ENCOUNTER — Encounter: Payer: Self-pay | Admitting: Family Medicine

## 2010-08-06 ENCOUNTER — Ambulatory Visit (INDEPENDENT_AMBULATORY_CARE_PROVIDER_SITE_OTHER): Payer: Medicare Other | Admitting: Family Medicine

## 2010-08-06 DIAGNOSIS — E119 Type 2 diabetes mellitus without complications: Secondary | ICD-10-CM

## 2010-08-06 DIAGNOSIS — J449 Chronic obstructive pulmonary disease, unspecified: Secondary | ICD-10-CM

## 2010-08-06 DIAGNOSIS — I2510 Atherosclerotic heart disease of native coronary artery without angina pectoris: Secondary | ICD-10-CM

## 2010-08-06 DIAGNOSIS — I359 Nonrheumatic aortic valve disorder, unspecified: Secondary | ICD-10-CM

## 2010-08-06 LAB — GLUCOSE, CAPILLARY: Glucose-Capillary: 120 mg/dL — ABNORMAL HIGH (ref 70–99)

## 2010-08-06 MED ORDER — CLOPIDOGREL BISULFATE 75 MG PO TABS
75.0000 mg | ORAL_TABLET | Freq: Every day | ORAL | Status: DC
Start: 2010-08-06 — End: 2010-12-25

## 2010-08-06 MED ORDER — METOPROLOL SUCCINATE ER 25 MG PO TB24: ORAL_TABLET | ORAL | Status: DC

## 2010-08-06 MED ORDER — ATORVASTATIN CALCIUM 20 MG PO TABS
20.0000 mg | ORAL_TABLET | Freq: Every day | ORAL | Status: DC
Start: 2010-08-06 — End: 2011-01-05

## 2010-08-06 MED ORDER — GLIPIZIDE 10 MG PO TABS: 10.0000 mg | ORAL_TABLET | Freq: Two times a day (BID) | ORAL | Status: DC

## 2010-08-06 MED ORDER — BUDESONIDE-FORMOTEROL FUMARATE 80-4.5 MCG/ACT IN AERO: 2.0000 | INHALATION_SPRAY | Freq: Two times a day (BID) | RESPIRATORY_TRACT | Status: DC

## 2010-08-06 MED ORDER — METOPROLOL SUCCINATE ER 25 MG PO TB24
25.0000 mg | ORAL_TABLET | Freq: Every day | ORAL | Status: DC
Start: 2010-08-06 — End: 2010-08-06

## 2010-08-06 MED ORDER — AMIODARONE HCL 200 MG PO TABS: 200.0000 mg | ORAL_TABLET | Freq: Two times a day (BID) | ORAL | Status: DC

## 2010-08-06 NOTE — Progress Notes (Signed)
  Subjective:    Patient ID: Matthew Edwards, male    DOB: 31-Oct-1940, 70 y.o.   MRN: [id number]  HPI Comments: Here after hospitalization for CABG and Aortic valve replacement. Had some LE swelling after and A. Fib, currently on Plavix and amiodarone.  Received new diagnosis of Diabetes while in house.  BS in 140-150 range.  A1C was 6.1.  On Glipizide 10 mg for BS and were in 100s first day home, now were in 200s on second day.  Definitely not stable.  Has constipation and leg edema.  Needs new rx for MAP program and note for Medicaid.  Will see Cards and CVTS in next few days-weeks.     Review of Systems  Constitutional: Positive for activity change, appetite change and fatigue.  Respiratory: Negative for chest tightness and shortness of breath.   Cardiovascular: Negative for chest pain.       Objective:   Physical Exam  Vitals reviewed. Constitutional: He is oriented to person, place, and time. He appears well-developed and well-nourished.  HENT:  Head: Normocephalic.  Cardiovascular: Normal rate.   Pulmonary/Chest: Effort normal.  Neurological: He is alert and oriented to person, place, and time.   BS reviewed       Assessment & Plan:

## 2010-08-06 NOTE — Progress Notes (Signed)
Daughter requested to check results on  home glucose meter with our office meter so she can know that her meter is working properly. RN checked a control also with patient 's home meter and is in proper range.  Home meter result 140 .  Office meter   120  patient is 4 hours post prandial.

## 2010-08-06 NOTE — Assessment & Plan Note (Signed)
Now s/p CABG

## 2010-08-06 NOTE — Assessment & Plan Note (Signed)
Need further BS trends to see how to appropriately increase medicine.  Diet and exercise reviewed.

## 2010-08-06 NOTE — Assessment & Plan Note (Signed)
Doing well---addressed recovery issues

## 2010-08-07 NOTE — Op Note (Signed)
NAME:  Matthew Edwards, BEICHLER NO.:  [id number]  MEDICAL RECORD NO.:  [id number]           PATIENT TYPE:  I  LOCATION:  [id number]                         FACILITY:  [facility]  PHYSICIAN:  Gilford Raid, M.D.     DATE OF BIRTH:  12-May-1941  DATE OF PROCEDURE:  07/28/2010 DATE OF DISCHARGE:                              OPERATIVE REPORT   PREOPERATIVE DIAGNOSIS:  Severe aortic stenosis and multivessel coronary artery disease.  POSTOPERATIVE DIAGNOSIS:  Severe aortic stenosis and multivessel coronary artery disease.  OPERATIVE PROCEDURE:  Median sternotomy, extracorporeal circulation, coronary artery bypass graft surgery x1 using a left internal mammary artery graft to the left anterior descending coronary artery, aortic valve replacement using a 25-mm Edwards pericardial valve.  ATTENDING SURGEON:  Gilford Raid, MD  ASSISTANT:  Magda Kiel, RNFA  ANESTHESIA:  General endotracheal.  CLINICAL HISTORY:  This patient is a 70 year old gentleman with a known history of aortic stenosis as well as coronary artery disease, status post PCI and stenting of his LAD and left circumflex in November 2006. He has been followed by Dr. Quay Burow, and echocardiogram 1 year ago showed moderate aortic stenosis with valve area of 1 cm2.  He recently presented with some complaints of chest discomfort and dizziness.  Repeat echocardiogram showed his aortic valve area to be 0.83 cm2 with a mean gradient of 41 and peak gradient of 83.  There was severely calcified trileaflet aortic valve with mild aortic insufficiency.  There was mild mitral regurgitation.  Left ventricular size is normal with moderate concentric left ventricular hypertrophy. Ejection fraction was greater than 70%.  Right ventricular function was normal.  Aortic root was normal.  He underwent cardiac catheterization on June 30, 2010, which showed left main to be normal.  There were patent stents in the proximal LAD and  left circumflex.  There was about 40% stenosis just proximal to the LAD stent and about 60-70% stenosis just beyond the LAD stent.  The right coronary artery was dominant vessel without significant disease.  Aortogram showed no aortic insufficiency and the supravalvular ascending aorta was not aneurysmal. After review of these studies and examination of the patient, it was felt that aortic valve replacement and coronary bypass graft surgery to the LAD was best treatment.  I discussed the operative procedure with the patient and his family.  We discussed the pros and cons of mechanical and tissue valves and I recommended a tissue valve given his age of 27 with coronary disease as well as the fact that he had some difficulty in focusing on what I was staying and may not be completely compliant with taking Coumadin and maintaining followup.  I discussed the benefits and risks of surgery including, but not limited to bleeding, blood transfusion, infection, stroke, myocardial infarction, heart block requiring permanent pacemaker, graft failure, structural valve deterioration in the future requiring further intervention, and death.  He understood all this and agreed to proceed.  OPERATIVE PROCEDURE:  The patient was taken to the operating room and placed on the table in supine position.  After induction of general endotracheal anesthesia, a Foley catheter was placed  in bladder using sterile technique.  Preoperative intravenous antibiotics were given. Then, the chest, abdomen and both lower extremities were prepped and draped in usual sterile manner.  Transesophageal echocardiogram showed good left ventricular function with moderate concentric left ventricular hypertrophy.  There was severe calcific aortic stenosis.  There was moderate aortic insufficiency.  There was trivial mitral regurgitation. Right heart function appeared normal.  Then, the chest was opened through a median sternotomy  incision.  The pericardium opened in midline.  Examination of the heart showed good ventricular contractility.  The ascending aorta was of normal size and had no palpable plaques in it.  Then, the left internal mammary artery was flushed from chest wall as pedicle graft.  This was a medium caliber vessel with excellent blood flow through it.  Then, the patient was heparinized when an adequate ACT was obtained. The distal ascending aorta was cannulated using a 20-French aortic cannula for arterial inflow.  Venous outflow was achieved using a 2- staged venous cannula for the right atrial appendage.  An antegrade cardioplegia and vent cannula was inserted in the aortic root.  A left ventricular vent was placed through the right superior pulmonary vein and a retrograde cardioplegic cannula was placed through a pursestring suture in the right atrium and advanced in the coronary sinus without difficulty.  The patient was then placed on cardiopulmonary bypass and cooled to 32 degrees centigrade.  The aorta was crossclamped and 600 mL of cold blood antegrade cardioplegia was administered in the aortic root with quick arrest of the heart.  There was good aortic root pressure despite of the aortic insufficiency.  This was followed by 300 mL of cold blood retrograde cardioplegia.  Systemic hypothermia to 32 degrees centigrade and topical hypothermic iced saline was used.  A temperature probe placed in the septum, insulating pad in the pericardium.  Additional doses of cold blood retrograde cardioplegia were given about 20-minute intervals to maintain myocardial temperature around 10-12 degrees centigrade.  Then, the distal anastomosis to the LAD was performed.  The internal diameter of the distal LAD was about 1.75 mm.  The conduit used was left internal mammary graft, was brought through an opening in the left pericardium anterior to the phrenic nerve.  This was anastomosed to the LAD in  end-to-side manner using continuous 8-0 Prolene suture.  The pedicle was sutured to the epicardium with 6-0 Prolene sutures.  Then, attention was turned to aortic valve replacement.  The aorta was opened transversely about 2 cm above the sinotubular junction. Examination of the aortic valve showed that there were 3 leaflets that were heavily calcified.  The annulus was also heavily calcified.  The right and left coronary ostia were identified and were not obstructed. Then, the native valve was excised.  The annulus was decalcified with rongeurs.  Care was taken to move all particulate debris.  The left ventricle and aortic root were irrigated with copious amounts of iced saline solution after conclusion of the debridement.  Left ventricle was directly inspected through the aortic annulus and no debris was seen. Then, aortic annulus was sized and a 25-mm Edwards pericardial Magna- Ease valve was chosen.  This had model number 3300 TFX serial number [id number].  Then, a series of 2-0 Ethibond horizontal mattress sutures were placed around the aortic annulus with pledgets in a subannular position.  The suture was then placed through the sewing ring of valve lowered into place.  The sutures was tied sequentially.  The valve seated nicely.  The right and left coronary ostia were not obstructed. The patient then rewarmed to 37 degrees centigrade.  The aorta was then closed in two layers using continuous 4-0 Prolene suture.  A light coating FloSeal was applied for hemostasis.  Then, the left side of the heart was de-aired.  We had insufflated the pericardial cavity with CO2 throughout the case to minimize intracardiac air.  After de-airing maneuvers were performed, head was placed in Trendelenburg position and the crossclamp removed with a time of 104 minutes.  There was spontaneous return of sinus rhythm.  The distal anastomosis appeared hemostatic.  Two temporary right ventricular and right  atrial pacing wires were placed and brought through the skin.  When the patient was rewarmed to 37 degrees centigrade, he was weaned from cardiopulmonary bypass on no inotropic agents.  Total bypass time was 145 minutes.  Cardiac function appeared good with cardiac output of 5 liters per minute.  Transesophageal echocardiogram showed preserved left ventricular function.  There was trivial mitral regurgitation. There was no perivalvular leak or regurgitation through the new aortic valve prosthesis.  Right heart function was normal.  Then, protamine was given intravenous and aortic cannulas removed without difficulty. Hemostasis was achieved.  The patient was given 1 unit of platelets due to platelet count of 90,000.  Then, 3 chest tubes were placed with 2 in the posterior pericardium of left pleural space and 1 in the anterior mediastinum.  Sternum was closed with double #6 stainless steel wires. The fascia was closed with continuous #1 Vicryl suture.  Subcutaneous tissue was closed with continuous 2-0 Vicryl and the skin with a 3-0 Vicryl subcuticular closure.  The sponge, needles, and counts were correct for the scrub nurse.  Dry sterile dressing applied over the incisions and around the chest tube which was hooked to Pleur-Evac suction.  The patient remained hemodynamically stable and transported to the SICU in guarded, but stable condition.     Gilford Raid, M.D.     BB/MEDQ  D:  07/28/2010  T:  07/29/2010  Job:  [id number]  cc:   Quay Burow, M.D.  Electronically Signed by Gilford Raid M.D. on 08/06/2010 11:31:45 AM

## 2010-08-18 ENCOUNTER — Ambulatory Visit (INDEPENDENT_AMBULATORY_CARE_PROVIDER_SITE_OTHER): Payer: Medicare Other | Admitting: Family Medicine

## 2010-08-18 ENCOUNTER — Other Ambulatory Visit: Payer: Self-pay | Admitting: Surgery

## 2010-08-18 ENCOUNTER — Encounter: Payer: Self-pay | Admitting: Family Medicine

## 2010-08-18 DIAGNOSIS — E119 Type 2 diabetes mellitus without complications: Secondary | ICD-10-CM

## 2010-08-18 DIAGNOSIS — I359 Nonrheumatic aortic valve disorder, unspecified: Secondary | ICD-10-CM

## 2010-08-18 DIAGNOSIS — I10 Essential (primary) hypertension: Secondary | ICD-10-CM

## 2010-08-18 DIAGNOSIS — I2510 Atherosclerotic heart disease of native coronary artery without angina pectoris: Secondary | ICD-10-CM

## 2010-08-18 DIAGNOSIS — E559 Vitamin D deficiency, unspecified: Secondary | ICD-10-CM

## 2010-08-18 MED ORDER — ERGOCALCIFEROL 1.25 MG (50000 UT) PO CAPS: 50000.0000 [IU] | ORAL_CAPSULE | ORAL | Status: DC

## 2010-08-18 MED ORDER — FUROSEMIDE 40 MG PO TABS: 40.0000 mg | ORAL_TABLET | Freq: Every day | ORAL | Status: DC

## 2010-08-18 MED ORDER — POTASSIUM CHLORIDE ER 10 MEQ PO TBCR: 20.0000 meq | EXTENDED_RELEASE_TABLET | Freq: Every day | ORAL | Status: DC

## 2010-08-18 MED ORDER — METOPROLOL SUCCINATE ER 25 MG PO TB24: ORAL_TABLET | ORAL | Status: DC

## 2010-08-18 NOTE — Progress Notes (Signed)
  Subjective:    Patient ID: Matthew Edwards, male    DOB: [birth year], 70 y.o.   MRN: [id number]  HPI Comments: Bs between 59-278.  Most in the 100's.  Finding certain foods trigger high blood sugar.  Diabetes He presents for his follow-up diabetic visit. He has type 2 diabetes mellitus. No MedicAlert identification noted. The initial diagnosis of diabetes was made 4 weeks ago. His disease course has been improving. Pertinent negatives for hypoglycemia include no confusion. Pertinent negatives for diabetes include no chest pain, no fatigue, no polyuria and no visual change. Symptoms are stable.  Hypertension Pertinent negatives include no chest pain.      Review of Systems  Constitutional: Negative for fatigue.  Cardiovascular: Negative for chest pain.  Genitourinary: Negative for polyuria.  Psychiatric/Behavioral: Negative for confusion.       Objective:   Physical Exam        Assessment & Plan:

## 2010-08-18 NOTE — Assessment & Plan Note (Signed)
Continue current diet and medication regimen.  Will re-check in 1 month.

## 2010-08-18 NOTE — Progress Notes (Signed)
  Subjective:    Patient ID: Matthew Edwards, male    DOB: 05-21-1941, 70 y.o.   MRN: [id number]  HPI Comments: Improved portion control.  BS betwenn 70-278.  Most in the 100 range.  Working on dietary changes that are needed.  Diabetes He has type 2 diabetes mellitus. No MedicAlert identification noted. The initial diagnosis of diabetes was made 3 weeks ago. His disease course has been improving. Pertinent negatives for hypoglycemia include no confusion, sweats or tremors. Pertinent negatives for diabetes include no chest pain, no polydipsia, no polyphagia and no visual change. Symptoms are stable.      Review of Systems  Constitutional: Negative for activity change and appetite change.  Cardiovascular: Negative for chest pain.  Neurological: Negative for tremors.  Hematological: Negative for polydipsia and polyphagia.  Psychiatric/Behavioral: Negative for confusion.       Objective:   Physical Exam  Constitutional: He appears well-developed and well-nourished.  HENT:  Head: Normocephalic and atraumatic.  Neck: Normal range of motion.  Cardiovascular: Normal rate, regular rhythm and normal heart sounds.   Pulmonary/Chest: Effort normal. He has wheezes.  Skin:       Incision is healing well.          Assessment & Plan:

## 2010-08-19 ENCOUNTER — Ambulatory Visit
Admission: RE | Admit: 2010-08-19 | Discharge: 2010-08-19 | Disposition: A | Discharge status: Home / Self Care | Payer: Medicare Other | Source: Ambulatory Visit | Attending: Surgery | Admitting: Surgery

## 2010-08-19 ENCOUNTER — Encounter (INDEPENDENT_AMBULATORY_CARE_PROVIDER_SITE_OTHER): Payer: Self-pay | Admitting: Surgery

## 2010-08-19 DIAGNOSIS — I359 Nonrheumatic aortic valve disorder, unspecified: Secondary | ICD-10-CM

## 2010-08-19 DIAGNOSIS — I2510 Atherosclerotic heart disease of native coronary artery without angina pectoris: Secondary | ICD-10-CM

## 2010-08-20 NOTE — Discharge Summary (Signed)
NAME:  Matthew Edwards, Matthew Edwards NO.:  [id number]  MEDICAL RECORD NO.:  [id number]           PATIENT TYPE:  I  LOCATION:  2033                         FACILITY:  [facility]  PHYSICIAN:  Gilford Raid, M.D.     DATE OF BIRTH:  November 21, 1940  DATE OF ADMISSION:  07/28/2010 DATE OF DISCHARGE:                              DISCHARGE SUMMARY   HISTORY:  The patient is a 70 year old gentleman with a known history of aortic stenosis as well as coronary artery disease status post PCI and stenting of his LAD and left circumflex in November 2006.  He has been followed by Dr. Quay Burow.  An echocardiogram done 1 year ago showed moderate aortic stenosis with a valve area of 1.0.  He recently presented with some complaints of chest discomfort and dizziness. Repeat echocardiogram showed his aortic valve area to be 0.83 sq. cm with a mean gradient of 41 and a peak gradient of 83.  There was severely calcified trileaflet aortic valve with mild aortic insufficiency.  There was mild mitral regurgitation.  Left ventricular size is normal with moderate concentric left ventricular hypertrophy. Ejection fraction was greater than 70%.  Right ventricular function was normal.  Aortic root was normal.  He underwent cardiac catheterization on June 30, 2010, which showed left main to be normal.  There was patent stents in the proximal LAD and left circumflex.  There was a 40% stenosis just proximal to the LAD stent and a 60% to 70% stenosis just beyond the LAD stent.  The right coronary artery was dominant vessel without significant disease.  The aortogram showed no aortic insufficiency and the supravalvular descending aorta was not aneurysmal. After review of these studies, cardiac surgical consultation was then obtained with Gilford Raid, MD, who evaluated the patient and studies agreed with recommendations to proceed with aortic valve replacement and coronary artery bypass grafting.  Dr. Cyndia Bent  discussed the operative procedure with the patient and his family discussing the pros and cons of mechanical and tissue valves and recommended a tissue valve given his age 24 with coronary artery disease and the fact that he had some difficulty in focusing on what he was saying and may not be compliant with taking Coumadin and maintaining followup.  He was admitted this hospitalization for the procedure.  REVIEW OF SYMPTOMS:  Please see the history and physical.  ALLERGIES:  No known drug allergies.  PAST MEDICAL HISTORY:  Significant for hypertension, hyperlipidemia.  He has a history of depression.  He has history of gastroesophageal reflux disease.  He has coronary artery disease status post PTCA and stenting as mentioned above.  He has aortic stenosis as described above.  MEDICATIONS PRIOR TO ADMISSION: 1. Lipitor 20 mg daily. 2. Plavix 75 mg daily. 3. Toprol-XL 25 mg daily. 4. Symbicort 18 mcg 1 puff b.i.d. 5. Accuretic 20/25 one daily. 6. Meclizine 25 mg p.r.n. 7. Aspirin 81 mg daily.  SOCIAL HISTORY:  He is separated and has five children.  He is retired. He is a remote smoker having quit about 4 years ago.  He does drink some occasional beer.  FAMILY HISTORY:  Father died in his 29s secondary to trauma and mother died at 49 secondary ti heart disease.  PHYSICAL EXAMINATION:  Please see the history and physical done at time of admission.  HOSPITAL COURSE:  The patient was admitted electively on July 28, 2010, taken to the operating room and underwent coronary artery bypass grafting x1 as well as aortic valve replacement with a 25-mm Edwards pericardial valve.  He tolerated the procedure well, was taken to the surgical intensive care unit in stable condition.  POSTOPERATIVE HOSPITAL COURSE:  The patient has overall progressed nicely.  He has had postoperative atrial fibrillation, primarily with a controlled ventricular response.  He is currently on amiodarone  and Lopressor and still having some paroxysms of atrial fibrillation/flutter.  Oxygen has been weaned and he maintains adequate saturations on room air.  Incisions are healing well without evidence of infection.  He has been gradually increasing his activities using standard protocols.  He has had some systolic hypertension and consideration is being made as to possibly starting an additional agent prior to discharge.  He has had some bronchospasm but this is slowly improving with bronchodilator as well as aggressive pulmonary toilet. Laboratory values have shown a mild acute blood loss anemia.  Most recent hemoglobin and hematocrit dated July 30, 2010, were 9.6 and 29.1 respectively.  Electrolytes are stable.  Most recent BUN and creatinine dated August 01, 2010, were 19 and 1.10 respectively. Currently, the patient does have some moderate volume overload but is responding well to diuretics.  The patient also has a new diagnosis of non-insulin-dependent diabetes mellitus and has been started on oral agent at this time.  He will receive some diabetic education prior to discharge as well.  Tentatively, the plan is for possible discharge in the morning of August 02, 2010, or August 03, 2010, pending morning reevaluation.  INSTRUCTIONS:  The patient received written instructions in regard to medications, activity, diet, wound care, and followup.  Follow up will include Dr. Cyndia Bent on August 19, 2010, at 1 p.m. with a chest x-ray. Additionally, he is instructed to follow up with his cardiologist, Dr. Gwenlyn Found, in 2 weeks.  MEDICATIONS ON DISCHARGE:  At the time of this dictation include the following: 1. Amiodarone 400 mg twice daily for 14 days, then 200 mg twice daily. 2. Lasix 40 mg daily for an additional 5 days. 3. Glipizide 10 mg daily. 4. Metoprolol 12.5 mg q.8 h. 5. Oxycodone 5 mg 1-2 tablets every 3 hours p.r.n. for pain. 6. Potassium chloride 20 mEq daily for 5 days. 7. Aspirin  enteric-coated tablet 81 mg daily. 8. Lipitor or 20 mg daily at bedtime. 9. Nitroglycerin 0.4 mg p.r.n. for chest pain. 10.Plavix 75 mg daily. 11.Symbicort 80/4.5 mcg 1 puff twice daily. 12.Vitamin C over-the-counter tablet one every other day.  Currently, he has not been restarted on his Accuretic, hydrochlorothiazide, or Toprol-XL,  Final diagnoses include severe aortic stenosis, severe multivessel coronary artery disease, postoperative acute blood loss anemia, postoperative atrial fibrillation/flutter, newly diagnosed diabetes mellitus type 2, history of hypertension, history of hyperlipidemia, history of depression, history of gastroesophageal reflux disease.     John Giovanni, P.A.-C.   ______________________________ Gilford Raid, M.D.    Loren Racer  D:  08/01/2010  T:  08/02/2010  Job:  [id number]  cc:   Quay Burow, M.D.  Electronically Signed by Jadene Pierini P.A.-C. on 08/13/2010 10:51:42 AM Electronically Signed by Gilford Raid M.D. on 08/20/2010 09:56:55 AM

## 2010-08-20 NOTE — Assessment & Plan Note (Signed)
OFFICE VISIT  Matthew Edwards, WALLS DOB:  1940-10-29                                        August 19, 2010 CHART #:  [id number]  The patient returned to my office today for followup status post coronary artery bypass graft surgery x1 and aortic valve replacement using a 25-mm Edwards pericardial valve.  He has been feeling fairly well since discharge and is walking short distances without the chest pain or shortness of breath.  PHYSICAL EXAMINATION:  Blood pressure is 126/75, pulse is 80 and regular, respiratory rate is 18 and unlabored.  Oxygen saturation on room air is 93%.  He looks well.  Cardiac exam shows regular rate and rhythm with normal bowel sounds.  There is no murmur.  His lungs are clear.  Chest incision is healing well and the sternum is stable.  There is mild bilateral ankle edema.  Follow up chest x-ray today shows improved aeration with small residual left pleural effusion and left basilar atelectasis.  His medications are: 1. Lipitor 20 mg bedtime. 2. Plavix 75 mg daily. 3. Toprol-XL 25 mg daily. 4. Symbicort 1 puff b.i.d. 5. Aspirin 81 mg daily. 6. Amiodarone 200 mg daily. 7. Glipizide 10 mg daily. 8. Vitamin C daily. 9. Oxycodone p.r.n. for pain. 10.Lasix 40 mg daily. 11.Potassium chloride 20 mEq daily. 12.Lopressor 12.5 mg q.8 hours.  IMPRESSION:  Overall, the patient is making good recovery following surgery.  I told him to make a return to driving a car, but should refrain from lifting anything heavier than 10 pounds for total of 3 months from date of surgery.  I encouraged him to continue walking as much as possible.  He will continue to follow up with Dr. Quay Burow, as well as his primary physician [name].  He will return to see me if he develops any problems with his incisions.  Gilford Raid, M.D. Electronically Signed  BB/MEDQ  D:  08/19/2010  T:  08/20/2010  Job:  [id number]

## 2010-09-02 NOTE — Consult Note (Signed)
NAME:  Matthew Edwards, Matthew Edwards NO.:  [id number]  MEDICAL RECORD NO.:  [id number]          PATIENT TYPE:  EMS  LOCATION:  MAJO                         FACILITY:  [facility]  PHYSICIAN:  Talbert Cage, M.D.DATE OF BIRTH:  11-29-1940  DATE OF CONSULTATION: DATE OF DISCHARGE:                                CONSULTATION   PRIMARY CARE PROVIDER:  Standley Dakins. Kennon Rounds, M.D. at the [facility]  CHIEF COMPLAINT:  Dizziness.  HISTORY OF PRESENT ILLNESS:  This is a 70 year old male with past medical history significant for diastolic CHF as well as aortic stenosis, coronary artery disease, status post stent, and hypertension, with a 1-day history of dizziness.  The patient was eating dinner yesterday evening and otherwise normal state of health when the patient noted to became acutely sick overnight with emesis x3.  Emesis was nonbloody, nonbilious.  The patient also noted severe dizziness with sitting as well as laying down around the time of nausea and emesis. Dizziness was most prominent with laying down.  The patient fell like the room is spinning.  Spinning sensation continued even while upright. The patient has never had episode like this in the past.  The patient states that he may have had a viral illness 2-3 weeks prior to this, but he is not sure, however, he does complaint of intermittent chronic rhinorrhea for the past 3 or 4 weeks.  The patient denies any chest pain, orthopnea, chest pressure, dyspnea, fevers, chills, lower extremities swelling, visual loss, nuclear rigidity, as well as no headache, no bright red blood per rectum, and no diarrhea.  The patient reports being compliant on home medication regimen.  The patient called the [facility] Emergency Department early this morning with the complaints of dizziness over the phone evaluation and working diagnosis of benign positional vertigo.  Family was instructed to give the patient  over-the-counter meclizine this morning, however, the patient states that he has not taken any new medications since the phone call was made and subsequently went to ED for his evaluation.  The patient was given Zofran in the ED with minimal improvement in nausea as well as IV fluids.  Family Practice Service was subsequently called to evaluate the patient for possible admission.  ALLERGIES:  PINEAPPLE FLAVORING.  MEDICATIONS: 1. Lipitor 20 mg p.o. daily. 2. Nitroglycerin sublingual 0.4 mg 1 tab under the tongue as directed. 3. Plavix 75 mg p.o. daily. 4. Toprol XL 25 mg p.o. daily. 5. Quinapril/HCTZ 20/25 1 tablet p.o. daily. 6. Symbicort 80/4.5 one puff b.i.d. 7. MiraLax one capful 8 ounces one times per day. 8. Aspirin 81 mg p.o. daily. 9. Vitamin D 50,000 capsules 1 tablet p.o. every week x8 weeks. 10.Pepcid 20 mg p.o. daily.  PAST MEDICAL HISTORY: 1. Aortic stenosis. 2. Coronary artery disease. 3. Hyperlipidemia. 4. Hypertension. 5. COPD. 6. Depression. 7. GERD.  PAST SURGICAL HISTORY: 1. The patient with coronary artery catheterization in 2006 showing a     75% blockage in the left circumflex and left anterior descending     with placement of a Cypher drug-eluting stent, by Dr. Gwenlyn Found. 2. The patient  with a coronary artery catheterization on November 15, 2006, showing multivessel artery disease with patent stents in LAD     and circumflex, and stenting of the obtuse marginal branch in 2008     with a non jugular distant.  FAMILY HISTORY:  Positive for father dying in his 79s secondary to trauma.  Mother died at age 76 secondary to heart disease.  SOCIAL HISTORY:  The patient currently lives alone.  The patient does smoke approximately one pack per day.  The patient has worse history of alcohol abuse, however, has not drank for greater than 3-4 years.  PHYSICAL EXAMINATION:  VITAL SIGNS:  Temperature 97.1, heart rate 60s, respirations 18, blood pressure 120s/60s up  to 70s, sating 99% on room air. GENERAL:  The patient is up in bed, alert.  NAD. HEENT:  Normocephalic, atraumatic.  Horizontal nystagmus bilaterally. No scleral icterus. NECK:  Supple.  Full range of motion.  No JVD noted. LUNGS:  Clear to auscultation bilaterally.  No wheezes, rales, or rhonchi. HEART:  99991111 systolic ejection murmur heard. ABDOMEN:  Obese, hypoactive bowel sounds, and nontender. EXTREMITIES:  2+ peripheral pulses.  Trace edema. NEUROLOGIC:  The patient is alert and oriented x3.  Cranial nerves II through II grossly intact.  Positive for horizontal nystagmus bilaterally.  Positive Dix-Hallpike.  Positive minimal active while walking.  Finger-to-nose intact.  LABORATORY DATA: 1. CBC with diff white count 7.9, hemoglobin 14.1, hematocrit 44.3,     and platelet count 129,000. 2. Troponin I with troponin of 0.01.  Total CK of 104, CK-MB is 2.6. 3. BMET; sodium 136, potassium 4.7, chloride 104, CO2 of 25, BUN 13,     creatinine 1.11, glucose of 169, calcium of 10.7. 4. UA, overall bland urinalysis. 5. Chest x-ray showing mild interstitial prominence suggesting     pulmonary vascular congestion.  No overt edema. 6. Head CT without contrast showing no evidence of acute intracranial     abnormality, chronic small vessel disease. 7. EKG showing normal sinus rhythm with heart rate in the 60s with T-     wave inversions in V5 and V6 (unchanged from the EKG in 2008).  ASSESSMENT/PLAN:  This 70 year old male with past medical history of coronary artery disease, congestive heart failure, as well as hypertension presenting with new onset dizziness.  1. Dizziness.  Dizziness is likely secondary to new onset vertigo as     the patient has positive horizontal nystagmus as well as positive     Dix-Hallpike.  Orthostatics were negative and we will give the     patient meclizine in the ED.  We will continue with gentle IV     hydration in the setting of diastolic heart failure.   We will     ambulate the patient as tolerated for assessing symptomatic     improvement.  We will on likely send him home with the prescription     for meclizine at home as the patient was supposed to have started     this prior to ED evaluation. 2. Cardiovascular.  The patient is known to have a rather extensive     cardiac history in the past.  However, on presentation, the patient     is currently asymptomatic with no clinical signs of acute coronary     or cardiac decompensation.  Of note, the patient does have     Cardiology followup on June 17, 2010, with cardiologist for     follow up on  heart failure and aortic stenosis.  As previously     noted, the patient is up and ambulating with no shortness of     breath, chest pain, orthopnea, dyspnea, or syncope.  The patient is     instructed to continue home medications in outpatient setting. 3. Hypercalcemia.  The patient is noted to be on vitamin D per primary     hyperparathyroidism.  Currently, no abdominal or nonspecific pain     in overall.  The patient instructed to follow up in 1-2 days.     Followup first of the week with primary care provider and the     family practice center for evaluation for hospital titration of     vitamin D. 4. Dizzy spell.  The patient will receive meclizine while in ED with     evaluation and see if there is any symptomatic improvement.  If the     patient's symptoms do resolve meclizine, then the patient will     likely be discharged home with close follow up in the next 1-2     days.     Shanda Howells, MD   ______________________________ Talbert Cage, M.D.    SN/MEDQ  D:  06/14/2010  T:  06/14/2010  Job:  [id number]  Electronically Signed by Shanda Howells  on 08/14/2010 07:50:14 AM Electronically Signed by Talbert Cage M.D. on 09/02/2010 03:42:22 PM

## 2010-09-02 NOTE — Consult Note (Signed)
  NAME:  Matthew Edwards, Matthew Edwards NO.:  [id number]  MEDICAL RECORD NO.:  [id number]          PATIENT TYPE:  EMS  LOCATION:  MAJO                         FACILITY:  [facility]  PHYSICIAN:  Talbert Cage, M.D.DATE OF BIRTH:  [birth year]  DATE OF CONSULTATION: DATE OF DISCHARGE:  06/14/2010                                CONSULTATION   ADDENDUM: The patient was reevaluated in the ED after filling 50 mg of p.o. meclizine.  The patient reports greater than 50% improvement in overall dizziness.  He is up ambulating with minimal assistance.  The patient was given options to be admitted for observation.  The patient feels that he would do better at home for follow-up.  The patient was given a prescription for meclizine 25 mg p.o. b.i.d. to be filled for maintenance for treatment for vertigo as well as an up to date patient handout on vertigo.  The patient was instructed to be on bedrest with minimal movement out of bed x24 to 36 hours and to follow up with the [facility] on June 16, 2010.  Flag was sent to administration team via [facility] for the patient to be managed, working on Monday for follow-up.  The patient is agreeable to plan as well as overall case being precepted with [name] prior to discharge.     Shanda Howells, MD   ______________________________ Talbert Cage, M.D.    SN/MEDQ  D:  06/14/2010  T:  06/14/2010  Job:  [id number]  Electronically Signed by Shanda Howells  on 08/14/2010 07:50:21 AM Electronically Signed by Talbert Cage M.D. on 09/02/2010 03:42:38 PM

## 2010-10-09 ENCOUNTER — Telehealth (INDEPENDENT_AMBULATORY_CARE_PROVIDER_SITE_OTHER): Payer: Medicare Other | Admitting: Family Medicine

## 2010-10-09 DIAGNOSIS — E119 Type 2 diabetes mellitus without complications: Secondary | ICD-10-CM

## 2010-10-09 MED ORDER — GLIPIZIDE 10 MG PO TABS: 10.0000 mg | ORAL_TABLET | Freq: Two times a day (BID) | ORAL | Status: DC

## 2010-10-09 NOTE — Telephone Encounter (Signed)
Patient needs a refill of Glucotrol.  He is out and did not have any to take today.  He is using [facility] on [address] instead of [facility]  He needs a 90 day supply because it will only cost $10.00

## 2010-10-10 ENCOUNTER — Encounter: Payer: Self-pay | Admitting: Cardiology

## 2010-10-14 NOTE — H&P (Signed)
NAME:  Matthew Edwards, MULA NO.:  [id number]   MEDICAL RECORD NO.:  [id number]          PATIENT TYPE:  EMS   LOCATION:  MAJO                         FACILITY:  [facility]   PHYSICIAN:  Clifton Custard, M.D.      DATE OF BIRTH:  1941/05/30   DATE OF ADMISSION:  11/12/2006  DATE OF DISCHARGE:                              HISTORY & PHYSICAL   ATTENDING PHYSICIAN:  Will be Blane Ohara McDiarmid, M.D. with the [facility].   PRIMARY CARE PHYSICIAN:  The patient's primary care physician is  Lauraine Rinne, MD with [facility].   CARDIOLOGIST:  The patient's cardiologist is Dr. Gwenlyn Found.   CHIEF COMPLAINT:  Chest pain.   HISTORY OF PRESENT ILLNESS:  The patient is a 70 year old African  American male with past medical history of coronary artery disease,  status post drug-eluting stent placement in November 2006, who presents  with a 2-week history of sharp intermittent chest discomfort lasting for  a few seconds at a time.  The pain is substernal, non radiating, is  sometimes, but not always brought on with exertion.  He also has noticed  that the pain occurs after eating certain foods as well as when laying  down at night.  He noticed no improvement with rest.  He denies any  nausea, vomiting, diaphoresis, orthopnea or palpitations.  The patient  has missed recent appointments with his cardiologist due to financial  difficulties.  He states that he does take his medications regularly.  He is somewhat of a poor historian.Marland Kitchen   PAST MEDICAL HISTORY:  1. Coronary artery disease, status post drug-eluting stent placement      in his LAD, as well as middle and distal circumflex arteries in      November 2006,  2. Hypertension.  3. Hyperlipidemia.  4. Aortic stenosis with ejection fraction of 65-75% with a valve area      of 1.25 cm2.  5. COPD.  6. Tobacco abuse.   MEDICATIONS:  1. Accupril 10 mg orally daily.  2. HGTT 25 mg orally  daily.  3. Lipitor 20 mg orally nightly.  4. Aspirin 81 mg daily.  5. Plavix 75 mg daily.  6. Toprol XL 25 mg daily.   ALLERGIES:  NO KNOWN DRUG ALLERGIES.   FAMILY HISTORY:  Mom recently died of coronary disease.  Father died of  unknown causes.  Siblings with hypertension and questionable heart  disease.   SOCIAL HISTORY:  Lives alone.  He has two daughters who are in the area.  He is on disability secondary to his coronary artery disease.  He  currently smokes 3 to 4 cigarettes a today.  No alcohol use and 2006.  No illicit drug use.  He is separated from his wife.   REVIEW OF SYSTEMS:  No abdominal pain.  No calf swelling.  No weight  loss.  No night sweats.  No syncope and no melena.  No hematochezia.  He  did have a 10-minute period of epistaxis today and in his right nostril.  It stopped with pressure.  He has no prior nose bleeds or any other  bleeding.  He does note some dyspnea and fatigue with exercise, which  has been his baseline for the past couple years.  There is no worsening  with these symptoms.   PHYSICAL EXAMINATION:  VITALS:  Temperature 98.8, pulse 57 to 62, blood  pressure 139 to 157 over 69 to 79.  Respiratory rate 20, 98% on room  air.  GENERAL:  He is alert, in no acute distress.  HEENT:  Mucous membranes are moist.  Pupils equally round reactive to  light accommodation.  Extraocular motion intact.  There is no current  epistaxis.  NECK:  No JVD.  No carotid bruits.  LUNGS:  No increased work for breathing.  Positive diffuse expiratory  wheezes.  CARDIOVASCULAR EXAM:  Regular rate and rhythm.  A 2/6 systolic ejection  murmur radiating to the carotids.  Chest pain not reproducible with  palpation.  ABDOMEN:  Positive bowel sounds, obese, soft, nontender, nondistended.  EXTREMITIES:  No cyanosis, clubbing or edema.  Two plus radial and  dorsalis pedis pulses.  NEURO:  He is alert and x3.  Cranial nerves II through XII grossly  intact.  His 5/5 upper  and lower extremity strength.   LABORATORY DATA:  White blood cell count 5.8, hemoglobin 15.6,  hematocrit 46.8, platelet count 134.  Sodium 138, potassium 5, chloride  107, bicarb 25, BUN 12, creatinine 1.  Glucose 9.  Cardiac enzymes  negative times one.  D-dimers within normal limits.  AST 28, ALT 21, alk  phos 76, total bilirubin 0.9, albumin 3.8.  BNP 95.  Urinalysis within  normal limits.  Chest X-Ray:  No active disease.  EKG shows sinus  bradycardia at 55 beats per minute with left ventricular hypertrophy.  The patient is noted to have new T-wave inversions in leads 3 and AVF in  one of the two EKGs, which was done in the emergency department   ASSESSMENT/PLAN:  A 70 year old African-American male with coronary  artery disease, status post multi stent placement with chest discomfort.  1. Chest pain, atypical angina in the substernal.  Questionable      association with exertion, no change with rest.  He does have T-      wave inversions in leads 3 and AVF.  Pain also has been following      meals so possibly due to gastroesophageal reflux disease.      Pulmonary embolus is very unlikely with negative D-dimer.  We will      rule out for myocardial infarction with cardiac enzymes as well as      serial EKG's.  We will restratify and check lipid panel.  The      patient is currently on appropriate coronary artery disease      regimen.  We will continue ACE inhibitor and beta blocker, statin,      aspirin and Plavix.  The patient is without chest pain currently,      but we will use nitro as needed.  2. Hypertension, blood pressure slight above goal.  We will monitor.      May need to increase the dose of his ACE inhibitor if he remains      above goal.  3. Hyperlipidemia.  He is on Lipitor.  Will check fasting lipid panel.  4. Chronic obstructive pulmonary disease.  The patient was wheezing on     exam, but he is not complaining of current dyspnea.  Recommend  pulmonary  function tests on an outpatient basis if these have not      already been done.  We will use beta agonist as needed.  5. Tobacco abuse.  Counsel the patient on the need for smoking      cessation with his coronary artery disease and chronic obstructive      pulmonary disease.  6. Aortic stenosis.  The patient will need a follow-up echo with his      cardiologists if this has not been done recently, or if his last      echo for evaluation of his aortic stenosis was over a year and a      half ago.  7. Gastroesophageal reflux disease.  Possible etiology of his chest      pain.  We will start Protonix while he is in the hospital if      electrolytes are within normal limits.  We will have the patient on      heart healthy diet while here in the hospital.      Clifton Custard, M.D.     MR/MEDQ  D:  11/13/2006  T:  11/13/2006  Job:  [id number]

## 2010-10-14 NOTE — Cardiovascular Report (Signed)
NAME:  Matthew Edwards, Matthew Edwards NO.:  [id number]   MEDICAL RECORD NO.:  [id number]          PATIENT TYPE:  INP   LOCATION:  2807                         FACILITY:  [facility]   PHYSICIAN:  Bryson Dames, M.D.DATE OF BIRTH:  [birth year]   DATE OF PROCEDURE:  11/15/2006  DATE OF DISCHARGE:                            CARDIAC CATHETERIZATION   CARDIAC CATHETERIZATION AND PERCUTANEOUS CORONARY INTERVENTION:   PROCEDURES PERFORMED:  1. Selective coronary angiography by Judkins technique.  2. Retrograde left heart catheterization.  3. Left ventricular angiography.  4. Right coronary artery stenting of the proximal obtuse marginal      branch #1 of the left circumflex coronary artery.   COMPLICATIONS:  None.   ENTRY SITE:  Right femoral.   DYE USED:  Omnipaque.   PATIENT PROFILE:  Matthew Edwards is a 70 year old African-American gentleman  who is admitted to the service of Dr. Sherren Mocha Edwards.  He has previously  been seen by Dr. Quay Edwards at the [facility].  He has had previous coronary artery disease including a drug-  eluting stent into his LAD some years ago and then a middle and distal  circumflex stent in November of 2006.  He has hypertension,  hyperlipidemia.  He has known aortic stenosis with an ejection fraction  of 65-75%, chronic obstructive lung disease and tobacco abuse.  The  patient enters the cath lab today after having been worked up for recent  onset of chest pain.  Today's procedure was performed electively without  any complications.   RESULTS:  Pressures - left ventricular pressure was [id number], end-diastolic  pressure 11, central aortic pressure 144/77, mean of 107.  The gradient  was 53 mm.  The transvalvular gradient was peak-to-peak 47 mmHg.  I was  able to cross with a guidewire and a pigtail catheter.  No complications  occurred.   ANGIOGRAPHIC RESULTS:  The patient's left main coronary artery had a  somewhat superior  takeoff and 3.5 catheters were best used in order to  intubate this vessel.  The LAD contained a radio-opaque stent in the  proximal to midportion of the vessel that appeared widely patent.  There  were no lesions in the LAD or its diagonal systems that were noteworthy.   This circumflex was nondominant.  It contained a first obtuse marginal  branch containing a 95% eccentric stenosis very focal in the proximal  obtuse marginal branch #1.  The mid circumflex and distal circumflex  were widely patent throughout including a stent located in the distal  circumflex obtuse marginal branch just after an acute side branch arose.  It was widely patent.   Right coronary artery was the dominant vessel to circulation.  It  contained scattered luminal irregularities but nothing of any high-grade  significance.  There was a large pulmonary conus branch, a large atrial  circumflex branch, and the RCA trifurcated distally into a large  posterior descending and posterolateral branch.  Left ventricular  angiography showed left ventricular cavity obliteration in the lower one-  half of the left ventricle.  Ejection fraction estimates of 75-85%.  No  mitral regurgitation seen.   Percutaneous coronary intervention on the obtuse marginal branch #1 was  performed using angiograms which achieved an ACT of greater than 360  seconds, Plavix 300 mg p.o., Pepcid 20 mg , and the equipment used  included a Cordis left Judkins 3.5 guide catheter with a Prowater wire  and I direct stented with 2.5 x 12 mm MINI VISION stent.  Stent  deployment with the stent balloon was performed to 14 atmospheres of  pressure.  I then used a Cordis DURA STAR balloon and inflated to 18  atmospheres of pressure for 30 seconds to achieve maximal stretching of  the stent.  The 95% stenosis originally seen was now 0% residual and  TIMI III flow was preserved in the distal obtuse marginal branch.   FINAL IMPRESSIONS:  1. Multivessel  coronary artery disease with patent stents in LAD and      circumflex.  2. New de novo lesion in the proximal circumflex obtuse marginal      branch of 95%.  3. Successful direct stenting of the proximal obtuse marginal branch      #1 with a non drug coated stent.  4. Aortic stenosis with a transvalvular gradient of 47 mm.  5. Hypertrophic profile of left ventricle with ejection fraction of 75-      85%.   PLAN:  The patient will be followed by Dr. Quay Edwards.  He will be a  candidate for discharge after tomorrow from the standpoint of his stent.  The patient was on Plavix before the procedure and will be continued on  it hereafter.           ______________________________  Bryson Dames, M.D.     WHG/MEDQ  D:  11/15/2006  T:  11/15/2006  Job:  [id number]   cc:   Matthew Edwards, M.D.

## 2010-10-17 NOTE — Discharge Summary (Signed)
NAME:  Matthew Edwards, Matthew Edwards NO.:  [id number]   MEDICAL RECORD NO.:  [id number]          PATIENT TYPE:  INP   LOCATION:  6532                         FACILITY:  [facility]   PHYSICIAN:  Kasandra Knudsen, M.D.   DATE OF BIRTH:  [birth year]   DATE OF ADMISSION:  11/13/2006  DATE OF DISCHARGE:  11/16/2006                               DISCHARGE SUMMARY   ADMITTING DIAGNOSES:  1. Chest pain, atypical for angina.  2. Hypertension.  3. Hyperlipidemia.  4. Chronic obstructive pulmonary disease.  5. Tobacco abuse.  6. Aortic stenosis.   DISCHARGE DIAGNOSES:  1. Resolved chest pain status post non-drug-coated stent in the      proximal obtuse marginal branch.  2. Hypertension.  3. Hyperlipidemia.  4. Chronic obstructive pulmonary disease.  5. Aortic stenosis.  6. Tobacco abuse.   DISCHARGE MEDICATIONS:  1. Plavix 75 mg p.o. daily.  2. Hydrochlorothiazide 25 p.o. daily.  3. Aspirin 325 p.o. daily.  4. Nitrostat 0.4 mg sublingual as directed  5. Enalapril 10 mg p.o. daily, however this was changed after a phone      call from his primary pharmacy to Accupril 10 mg daily.  6. Metoprolol 25 one-half tab p.o. b.i.d., this too was changed after      an outside phone call from the patient's pharmacy to Toprol XL 25      daily.  7. Symbicort two puffs b.i.d., a sample was given.  8. Pravastatin 20 mg p.o. daily, again received an outside phone call      from the pharmacy and he is now on Lipitor 20 mg p.o. q.h.s.  9. Albuterol one or two puffs q.4-6 hours p.r.n. shortness of breath      or wheezing.   We attempted to put all his medications at the [facility] for  $4.00 because the patient stated he could not obtain them financially,  however received a phone call from the ________  Pharmacy and he is able  to obtain the medications for free; therefore, we reverted back to the  medications that he came in on.   HOSPITAL COURSE:  This is a 70 year old African-American  gentleman who  came in with a complaint of chest pain.  He has significant medical  history of coronary artery disease and had stents placed in November of  2006.  Given his significant cardiac history and his presentation of  chest pain, we consulted cardiology during his hospital course.  Please  see the following for details:   1. Chest pain:  His chest pain was atypical angina in nature.  We      started him on beta-blocker, continued statin, aspirin and      continued his Plavix.  We consulted cardiology.  We checked cardiac      enzymes which remained negative throughout his course.  Cardiology      consulted and then took the patient to the cath lab upon which it      was noticed that he had 90% stenosis of his proximal obtuse      marginal branch which was stented with  a non-drug stent.  Patient      was continued on his Plavix and aspirin post cath.  He had no      complications during the cath.  He will follow up with his      cardiologist, Dr. Gwenlyn Found, in one or two weeks.  We continued his      home hypertensive medications.  2. Aortic stenosis:  We also consulted cardiology, as the patient      complained of some syncope-type symptoms.  We wanted them to review      the aortic stenosis.  The cardiologist spoke with [place] regarding the      aortic stenosis and felt it was moderate in nature and did not need      a valve replacement at this time.  They will follow him with Dr.      Gwenlyn Found in the outpatient setting to further characterize his      stenosis.  Likely, he will need a valve replacement in the future,      but not at this time.  His aortic stenosis had a transvalvular      gradient of 47 mm.  No further treatment was needed for his aortic      stenosis at this time.  It is unknown whether he did have a      syncopal-type episode related to this.  We will continue to monitor      in the outpatient setting.  3. Hypertension:  We continued the patient on  hydrochlorothiazide,      metoprolol while in the hospital as well as enalapril while in the      hospital.  His blood pressure remained fairly well controlled      during his hospital stay.  No other changes were made.  4. Hyperlipidemia:  We will continue the patient on a statin during      this hospital admission.  5. COPD:  The patient did not come in on any medications for his COPD,      however it was noted throughout his hospital course that he      remained with fairly poor air movement and also had some      significant wheezes on physical exam.  Therefore, we started Advair      as well as albuterol during his hospital course.  Due to financial      constraints, we did not write a prescription for Advair instead we      gave him a sample of [facility]ort [facility].  After speaking with his pharmacy, he will be able to      continue this because they have a drug program for it.  I      instructed the patient how to use the inhalers.  We also gave the      patient albuterol inhaler to use p.r.n.  Patient did have PFTs that      showed an FVC at 61% predicted, FEV-1 64% and FEV-1/FVC 93%; there      was an 18% change pre and post bronchodilator for FVC and a 21%      change for FEV-1.  Although the computer read this as an      obstructive disease, attending physician felt it could be more of a      restrictive pattern.  The primary care physician can follow this up      in the  outpatient setting.  6. Tobacco abuse:  The patient only smokes three to four cigarettes a      day.  Smoking cessation was consulted and the patient wishes to      quit at this time.  Primary care physician can follow up in the      future.  7. GERD:  We continued the proton pump inhibitor during this hospital      stay.   PERTINENT LABS:  Patient's enzymes remained negative with the highest troponin being 0.02.  Urine drug screen negative.  D-dimer was 10.022.  CBC on  admission:  White blood cells 5.8, hemoglobin 15.6, hematocrit  46.8, platelets 134; Day of discharge:  White blood cells 7.9,  hemoglobin 15.8, hematocrit 47.9, platelets 123.  Fasting lipid panel:  Total cholesterol 110, triglycerides 42, HDL 75, LDL 67.  TSH within  normal limits at 1.386.  Urinalysis within normal limits.  BNP 95.  Liver panel within normal limits with an AST at 28, ALT 21 with a  bilirubin 0.9.  Basic metabolic panel:  Sodium 0000000, potassium 3.9,  chloride 103, bicarb 27, BUN 13, creatinine 1.14.  PT/INR 1.   IMAGES:  Cardiac cath which showed multivessel coronary artery disease  with patent stents at LAD and circumflex, new de novo lesion at proximal  circumflex, obtuse marginal branch 95% of which a non-drug-coated stent  was placed, aortic stenosis with a transvalvular gradient at 44 mm and  hypertrophic profile, left ventricular with EF of 75 to 85.   Portable chest x-ray:  No active lung disease.   Echocardiogram:  EF to be 75%, hyperdynamic, questionable possibility of  a bicuspid aortic valve with mild-to-moderate aortic stenosis,  transaortic valve gradient to be 35.           ______________________________  Kasandra Knudsen, M.D.     JT/MEDQ  D:  11/21/2006  T:  11/21/2006  Job:  [id number]   cc:   Blane Ohara McDiarmid, M.D.  Lauraine Rinne, MD

## 2010-10-17 NOTE — H&P (Signed)
NAME:  ARRICK, PISKOR NO.:  [id number]   MEDICAL RECORD NO.:  [id number]          PATIENT TYPE:  OBV   LOCATION:  [id number]                         FACILITY:  [facility]   PHYSICIAN:  Billey Chang, M.D.     DATE OF BIRTH:  1940/09/13   DATE OF ADMISSION:  03/28/2005  DATE OF DISCHARGE:                                HISTORY & PHYSICAL   CHIEF COMPLAINT:  Chest pain.   HISTORY OF PRESENT ILLNESS:  [name] is a 70 year old male with a history  of angina reporting to the emergency department with worsening chest pain  over the last 3 months.  The pain is located substernally without radiation.  It is sharp in nature.  The chest pain mostly occurs with any type of  exertional activity (i.e. walking or lifting).  The patient has recently  taken a medical leave of absence from his job secondary to persistent chest  pain with routine work activities.  The pain is sometimes associated one  hour status post meals.  The patient does state that his pain may occur  occasionally with rest, but more frequently with exertion.  Diaphoresis  occurs with pain, as well as shortness of breath.  The pain usually lasts 5-  10 minutes.  It is relieved with rest.  No other alleviating factors.  The  patient has been given a prescription for NitroQuick by a primary care  physician, but has not filled with prescription.  The patient has been  awaiting initial cardiology appointment via [facility] but the  first appointment is not until February of 2007.   REVIEW OF SYSTEMS:  No fevers, chills.  No unintentional weight gain or  weight loss.  No PND.  No orthopnea.  No cough.  No shortness of breath  reported, except with chest pain.  Heartburn occasionally after meals and  when the patient first goes to bed.  Occasional dark stools.  Occasional  constipation.  No lower extremity edema.   PAST MEDICAL HISTORY:  1.  Alcohol abuse.  No alcohol since February 2006, per patient.  2.   Tobacco abuse.  3.  Hypertension.  4.  Aortic stenosis; 2-D echo in May of 2006 showed moderate to mild aortic      stenosis, left ventricular ejection fraction of 65% to 75%.  5.  Anginal pectoris.  6.  Gastroesophageal reflux.   ALLERGIES:  No known drug allergies.   MEDICATIONS:  Enalapril/HCTZ 10/25 mg p.o. daily.   FAMILY HISTORY:  Mother living with heart disease and hypertension.  Father  deceased; unknown health history.  Siblings with hypertension, angina  pectoris.   SOCIAL HISTORY:  The patient is separated.  Daughter is accompanying him to  the ED and is very supportive.  The patient lives alone.  Smokes  approximately 1 pack per day.  History of alcohol abuse, but denies alcohol  since February or March 2006.  The patient is employed at [employer], but is  temporarily on a leave of absence secondary to chest pain.   PHYSICAL EXAMINATION:  VITAL SIGNS:  Temperature 97.1, pulse 71,  respiratory  rate 20, blood pressure 152/73, O2 saturation 94% on room air.  GENERAL:  Alert and oriented, in no apparent distress.  Glasgow coma scale  of 15.  HEENT:  Atraumatic and normocephalic.  Pupils equal, round and reactive to  light.  Extraocular movements intact.  Neck with full range of motion  without stiffness.  Oropharynx clear and moist.  Multiple caries in upper  and lower molars.  No abscess noted.  No thyromegaly.  No cervical  lymphadenopathy.  CARDIOVASCULAR:  No JVD.  Chest pain not reproducible with palpation.  HEART:  Regular rate and rhythm.  A grade 2 systolic murmur heard at aorta  and mitral regions.  LUNGS:  Clear to auscultation bilaterally.  ABDOMEN:  Obese, soft, nontender, nondistended.  No guarding, no  hepatosplenomegaly.  No masses.  EXTREMITIES:  No lower extremity edema.  There were 1+ pedal pulses equal  bilaterally.  NEUROLOGIC:  Cranial nerves II-XII intact.  There was 5/5 muscle strength in  the upper and lower extremities.  RECTAL:  Good rectal  tone.  No stool in the rectal vault.  No gross blood  appreciated.   LABORATORIES AND TESTS:  i-STAT revealed a sodium of 138, potassium 3.9,  chloride 106, bicarbonate 26.5, BUN 10, creatinine 1.0, glucose 93.  Hemoglobin 15.3, hematocrit 48.0.  Point of care enzymes set #1 - myoglobin  70.3.  CK-MB less than 1.0.  Troponin less than 0.05.  D-dimer 0.22.  PTT of  31, PT 13.3, INR 1.0.  BNP of 51.  Chest x-ray with no acute changes,  awaiting formal read.   ASSESSMENT AND PLAN:  A 70 year old male with increasing chest pain in  intensity and frequency with exertion over the last 3 months.  1.  Chest pain.  Differential includes gastroesophageal reflux versus      unstable angina.  The patient has cardiac risk factors which include      tobacco abuse, obesity, and hypertension.  LDL in the low 100s in May of      2006.  At today's admission, cardiac enzymes were negative x1 set.  For      now, will continue to cycle cardiac enzymes x2 more sets and repeat EKG      in the a.m.  EKG consistent with previous EKG notable for nonspecific T      wave changes.  Will continue nitroglycerin drip.  May need to consider      consulting cardiology for possible catheterization as an inpatient.      Still considering gastroesophageal reflux as a diagnosis, considering      the patient does have a significant history of reflux and noncompliance      with medications.  Will start Protonix daily and monitor for improvement      of chest pain.  The etiology of chest pain less likely secondary to      pulmonary embolus, considering D-dimer within normal limits.  2.  Hypertension.  Elevated at admission.  Will continue home regimen of      enalapril/HCTZ for now and monitor.  Blood pressure may be mildly      elevated secondary to anxiety.  3.  Tobacco abuse.  Will offer nicotine patch and encourage smoking      cessation counseling.  4.  History of alcohol abuse.  Consider checking alcohol level. 5.  DVT  prophylaxis.  Will place SCDs for prophylaxis.      Starleen Blue, M.D.    ______________________________  Billey Chang, M.D.    VRE/MEDQ  D:  03/29/2005  T:  03/29/2005  Job:  [id number]   cc:   Zettie Pho(?), M.D.  [place]

## 2010-10-17 NOTE — Cardiovascular Report (Signed)
NAME:  Matthew Edwards, Matthew Edwards NO.:  [id number]   MEDICAL RECORD NO.:  [id number]          PATIENT TYPE:  OIB   LOCATION:  6527                         FACILITY:  [facility]   PHYSICIAN:  Quay Burow, M.D.   DATE OF BIRTH:  July 28, 1940   DATE OF PROCEDURE:  04/21/2005  DATE OF DISCHARGE:                              CARDIAC CATHETERIZATION   Matthew Edwards is a 70 year old black male with history of CAD and moderate AS.  He underwent diagnostic coronary arteriography at [facility] March 31, 2005, revealing aortic valve area of 1.2 cm and  moderate circumflex and LAD disease.  We had a long discussion with regard  to multivessel PCI stenting versus CABG AVR.  He does have angina.  His  other problems include COPD with ongoing tobacco abuse, hypertension,  hyperlipidemia.  It was elected to proceed with elective PCI and stenting  with careful follow-up of his valvular heart disease.   DESCRIPTION OF PROCEDURE:  Patient was brought to the second floor [facility] in the post absorptive state.  He was premedicated  with p.o. valium.  His right groin was prepped and shaved in the usual  sterile fashion.  Xylocaine 1% was used for local anesthesia.  A 7 French  sheath was inserted into the right femoral artery using standard Seldinger  technique.  A 6 French sheath was inserted into the right femoral vein.  Patient received aspirin and Plavix this morning in his regular dose as well  as an additional 300 mg of p.o. Plavix.  He received an Angiomax bolus with  an ACT of 377.   Using an XP 3.5 guide catheter along with an 0.14 190 [employer] soft wire and a  2.0 10 cutting balloon, cutting balloon atherectomy was performed to the  distal and mid circumflex coronary artery.  Following this, the distal  circumflex/PLA branch was stented with a 2.5 x 13 CYPHER stent at 14  atmospheres and the mid circumflex is a 3.0 x 13 at 14 atmospheres.  We  administered 200 mcg of intracoronary nitroglycerin.  The final angiographic  result was reduction of a tandem 75 and 90% mid and distal circumflex  stenosis to 0% residual.   The wire was then directed down the LAD and the mid LAD was predilated with  a 2.75 15 Maverick and stented with a 3.0 28 CYPHER at 16 atmospheres.  It  was post dilated with 3.25 20 Quantum Maverick at 16 atmospheres (3.3 mm)  resulting in reduction of a 75% segmental mid LAD stenosis to 0% residual.  Patient tolerated the procedure well.  He did have ST segment elevation with  balloon inflation which resolved quickly with deflation.   IMPRESSION:  Successful mid and distal circumflex and mid left anterior  descending percutaneous coronary intervention stenting using CYPHER drug  eluting stents and Angiomax anticoagulation.  Patient tolerated the  procedure well.  The guidewire and catheter were removed.  The sheaths were  sewn securely in place.  Patient left the lab in stable condition.  He will  be  hydrated overnight and discharged in the morning.  He will see me back in  approximately two weeks for follow-up.      Quay Burow, M.D.  Electronically Signed     JB/MEDQ  D:  04/21/2005  T:  04/22/2005  Job:  [id number]   cc:   [facility]  [address], [place] [address]   [facility]

## 2010-10-17 NOTE — Consult Note (Signed)
NAME:  Matthew Edwards, Matthew Edwards NO.:  [id number]   MEDICAL RECORD NO.:  [id number]          PATIENT TYPE:  OBV   LOCATION:  [id number]                         FACILITY:  [facility]   PHYSICIAN:  Broadus John, MD DATE OF BIRTH:  09-04-1940   DATE OF CONSULTATION:  03/29/2005  DATE OF DISCHARGE:                                   CONSULTATION   Matthew Edwards is a 70 year old black man who was admitted to [facility] yesterday because of chest pain.  The patient reportedly underwent  echocardiography in recent months which demonstrated aortic stenosis.  The  details of this report are not available at this time.   The patient gives a 4-5 month history of exertional chest pain.  He has not  experienced any chest pain at rest.  The chest pain is described as a sharp  and pressure discomfort across the chest.  It radiates to the neck  bilaterally.  It is associated with dyspnea and diaphoresis, but no nausea.  It occurs with such activities as walking and lifting heavy objects.  He  works as a Clinical research associate at [employer] and had to stop working because of the chest  pain.  The patient chest pain resolves within 3-5 minutes of discontinuing  the exertion.  Over the recent months, he has noted a progressively  decreasing level of activity which precipitates the chest pain.  He is free  of chest pain at this time.  There are no other exacerbating or ameliorating  factors.  Appears not to be related to position, meals, or respirations.   There is no history of myocardial infarction, congestive heart failure, or  arrhythmia.   The patient has a number of risk factors for coronary artery disease  including hypertension, smoking (approximately 3/4 to 1 pack of cigarettes  per day), and a family history of coronary artery disease (mother).  There  is no history of diabetes mellitus or dyslipidemia.   His only other medical problem is gastroesophageal reflux disease.   ADMISSION  MEDICATIONS:  Enalapril/hydrochlorothiazide 10/25 mg p.o. daily.   FAMILY HISTORY:  In addition to his mother's heart disease, he has several  siblings with hypertension.   SOCIAL HISTORY:  The patient is separated.  He lives alone.  There is a  history of alcohol abuse in the past, but he denies alcohol use in the last  6 months.  As noted, he was employed at [employer], but is on leave of absence  due to exertional chest pain.   REVIEW OF SYSTEMS:  No problems related to his head, eyes, ears, nose,  mouth, throat, lungs, gastrointestinal system, genitourinary system, or  extremities.  There is no history of neurologic or psychiatric disorder.  There is no history of fever, chills, or weight loss.   PHYSICAL EXAMINATION:  VITAL SIGNS:  Blood pressure 112/67, pulse 59,  respirations 16, temperature 98.1.  GENERAL:  The patient is an obese, middle-aged black man in no discomfort.  He was alert, oriented, appropriate, and responsive.  HEENT:  Normal.  NECK:  Without thyromegaly or adenopathy.  Carotid pulses  were palpable  bilaterally without bruits.  CARDIOVASCULAR:  Normal S1 and S2.  There is a grade 3/6 systolic ejection  murmur heard best at the left sternal border, but radiating elsewhere.  Cardiac rhythm is regular.  No chest wall tenderness was noted.  LUNGS:  Clear.  ABDOMEN:  Soft and nontender.  There was no mass, hepatosplenomegaly, bruit,  distention, rebound, guarding, or rigidity.  Bowel sounds were normal.  RECTAL:  GENITOURINARY:  Not performed as they were not pertinent to the  reason for acute care hospitalization.  EXTREMITIES:  Without edema, deviation, or deformity.  Radial and dorsalis  pedal pulses were palpable bilaterally.  NEUROLOGY:  Brief screening neurologic survey is unremarkable.   The Electrocardiogram revealed normal sinus rhythm.  The possibility of a  prior septal myocardial infarction could not be excluded.  Nonspecific T  wave changes were noted  throughout.   The chest radiograph report was pending.   Serial cardiac enzymes excluded the presence of myocardial injury.  Potassium was 4.8, BUN 10, and creatinine 0.9.  White count was 4.3 with  hemoglobin of 14.0 and hematocrit of 40.7.  The remaining studies were  pending at the time of this dictation.   IMPRESSION:  1.  Chest pain; rule out coronary artery disease.  Aortic stenosis is      present.  The history is consistent with progressive exertional angina.  2.  Hypertension.  3.  Gastroesophageal reflux disease.   RECOMMENDATIONS:  1.  Right and left heart cardiac catheterization.  2.  Obtain recent echo report.      Broadus John, MD  Electronically Signed     MSC/MEDQ  D:  03/29/2005  T:  03/30/2005  Job:  [id number]   cc:   Bryson Dames, M.D.  Fax: [phone]

## 2010-10-17 NOTE — Cardiovascular Report (Signed)
NAME:  Matthew Edwards, Matthew Edwards NO.:  [id number]   MEDICAL RECORD NO.:  [id number]          PATIENT TYPE:  INP   LOCATION:  [id number]                         FACILITY:  [facility]   PHYSICIAN:  Quay Burow, M.D.   DATE OF BIRTH:  Dec 20, 1940   DATE OF PROCEDURE:  DATE OF DISCHARGE:                              CARDIAC CATHETERIZATION   PROCEDURE:  Right and left heart catheterization.   Matthew Edwards is a 70 year old gentleman admitted October 29 with unstable  angina.  Recent echo revealed mild to moderate AS with normal LV function.  He has positive risk factors and ruled out for myocardial infarction.  He  presents now for diagnostic coronary arteriography to define his anatomy and  risk-stratify him.   PROCEDURE DESCRIPTION:  The patient was brought to the second floor [facility] cardiac catheterization lab in the postabsorptive state.  He was  premedicated with p.o. Valium.  His right groin was prepped and shaved in  the usual sterile fashion.  Xylocaine 1% was used for local anesthesia.  A 7  French sheath was inserted into the right femoral artery using standard  Seldinger technique.  An 8 French sheath was inserted into the right femoral  vein.  A 7 French balloon-tipped thermodilution Swan-Ganz catheter was used  to obtain sequential right heart pressures for determination of Fick and  thermodilution cardiac output.  Six French right and left Judkins diagnostic  catheter as well as 6 French pigtail catheter were used for selective  coronary angiography, left ventriculography, subselective left internal  mammary artery angiography and distal abdominal aortography.  Visipaque dye  was used for the entirety of the case.  Retrograde aortic, left ventricular  and pullback pressures were recorded.  Sequential right heart pressures were  also obtained.   </HEMODYNAMICS>  1.  RA pressure:  A-wave 13, V-wave 8, mean 7.  2.  RV pressure systolic 30, diastolic pressure of 3.  3.   PA pressure systolic 35, diastolic pressure 9, mean 22.  4.  Pulmonary capillary wedge pressure:  A-wave 17, V-wave 12, mean 11.  5.  Cardiac output by Fick was 4.9 L/min. with an index of 2.4 L/min. per      sq. m.  6.  Cardiac output by thermodilution was 6.1 L/min. with an index of 2.9      L/min. per sq. m.  7.  The calculated aortic valve area is 1.25 sq. cm with a peak gradient of      22 mmHg.  8.  The LV systolic was [id number], the end-diastolic pressure 6.  9.  AO systolic was 0000000, diastolic was 68.   SELECTIVE CORONARY ANGIOGRAPHY:  1.  Left main normal.  2.  LAD:  The LAD had a calcified midsegment with a long 75% lesion      extending from just proximal to the first small diagonal branch, which      itself had a 95% ostial stenosis, to just proximal to the second      moderate-sized diagonal branch.  3.  Left circumflex:  This is a codominant vessel that gave  off a moderate-      sized high first marginal branch that had 80% fairly focal proximal      stenosis.  There is a 75% hypodense fairly focal stenosis in the AV      groove just before the continuation onto the posterolateral branch.  The      posterolateral branch had a fairly focal 70% stenosis.  4.  Right coronary artery:  This is a codominant vessel that was free of      significant disease.   LEFT VENTRICULOGRAPHY:  RAO left ventriculogram was performed using 20 mL of  Visipaque dye at 10 cc/sec.  The overall LVEF is estimated greater than 60%  without focal wall motion abnormalities.   LEFT INTERNAL MAMMARY ARTERY:  This vessel is subselectively visualized and  is widely patent.  It is suitable for use during coronary artery bypass  grafting.   DISTAL ABDOMINAL AORTOGRAPHY:  Distal abdominal aortogram was performed  using 20 mL of Visipaque dye at 20 cc/sec.  The renal arteries were widely  patent.  The infrarenal abdominal aorta and iliac bifurcation appear free of  significant atherosclerotic disease.    IMPRESSION:  Matthew Edwards has two-vessel coronary artery disease, most likely  responsible for his effort angina.  He also has a mild to at most moderate  aortic stenosis, aortic valve area of 1.25 sq. cm and normal left  ventricular function.  The issue revolves around percutaneous coronary  intervention of his left anterior descending coronary artery, marginal and  distal circumflex with medical treatment of his aortic stenosis versus  coronary artery bypass graft/aortic valve replacement.  I will review the  angiograms with my colleagues prior to making final disposition.   The sheaths were removed and the pressure was held on the groin to achieve  hemostasis.  The patient left the lab in stable condition.      Quay Burow, M.D.  Electronically Signed     JB/MEDQ  D:  03/31/2005  T:  03/31/2005  Job:  [id number]   cc:   Second Floor Cardiac Catheterization Lab

## 2010-10-17 NOTE — Discharge Summary (Signed)
NAME:  Matthew Edwards, Matthew Edwards NO.:  [id number]   MEDICAL RECORD NO.:  [id number]          PATIENT TYPE:  INP   LOCATION:  [id number]                         FACILITY:  [facility]   PHYSICIAN:  Billey Chang, M.D.     DATE OF BIRTH:  11-04-1940   DATE OF ADMISSION:  03/28/2005  DATE OF DISCHARGE:  04/01/2005                                 DISCHARGE SUMMARY   RESIDENT:  Dr. Eliezer Lofts   DISCHARGE DIAGNOSES:  1.  Stable angina.  2.  Coronary artery disease.  3.  Hypertension.  4.  Hyperlipidemia.  5.  Wheezing.  6.  Tobacco abuse.   DISCHARGE MEDICATIONS:  1.  Aspirin 81 mg one p.o. daily.  2.  Enalapril 10 mg one p.o. daily.  3.  HCTZ 25 mg one p.o. daily.  4.  Metoprolol 12.5 mg one p.o. b.i.d.  5.  Zocor 20 mg one p.o. daily.  6.  Plavix 75 mg one p.o. daily.  7.  Nitroglycerin 0.4 mg one SL every five minutes x3 doses p.r.n. chest      pain.   CONSULTS:  Cardiology   HISTORY OF PRESENT ILLNESS:  See dictated H&P for details, but in summary  this is a 70 year old male who presented to the ED with a history of three  months of worsening chest pain.  The pain is substernal in location, sharp  in nature, and occurs on exertion and is relieved by rest.  The patient  reports diaphoresis and shortness of breath with pain.  He was given  NitroQuick previously, but had not filled the prescription.  He has a  cardiology appointment via [facility] in February of  2007.   HOSPITAL COURSE:  #1 - CHEST PAIN:  The patient was admitted to rule out MI.  The patient had cardiac enzymes that were negative x3 and no change on EKG.  Because of the concerning nature of his chest pain for stable angina and his  risk factors cardiology was consulted.  A cardiac catheterization was  performed and the patient was found to have significant coronary artery  disease with 75% blockage of his left circumflex artery and 75% blockage of  his LAD.  The patient did not report any  chest pain or shortness of breath  during his hospital course.  Per cardiology the patient is to have a  percutaneous intervention with stent placement in approximately two weeks as  an outpatient.  The patient had no complications from his cardiac  catheterization in the hospital.  The patient was advised to avoid strenuous  activity.  The patient was discharged on the following cardiac-protective  medications:  aspirin, enalapril, hydrochlorothiazide, metoprolol, Zocor,  Plavix, and nitroglycerin.  The patient is to follow up with cardiology on  the 14th of November 2006 to discuss further intervention for his coronary  artery disease.   #2 - HYPERTENSION:  The patient's hypertension was well controlled  throughout his hospital stay and he was continued on his medications of  enalapril 10 mg daily and HCTZ 25 mg daily.  Metoprolol 12.5 mg p.o.  b.i.d.  was added for cardiac protection.   #3 - HYPERLIPIDEMIA:  A lipid panel was checked on Matthew Edwards and he was found  to have an HDL of 38 and an LDL of 116.  Given his coronary artery disease  the goal for LDL is below 100 and he was put on Zocor 20 p.o. daily.   #4 - WHEEZING:  On admission examination the patient was found to have  diffuse wheezing in his lungs.  The patient received p.r.n. albuterol  nebulizers during this hospitalization.  Patient reports no wheezing or  difficulty breathing.  Patient is recommended to have pulmonary function  tests as an outpatient upon discharge.   #5 - TOBACCO ABUSE:  The patient talked with his primary doctor, Dr.  Esperanza Richters about following up with her and receiving assistance for  smoking cessation.   CONDITION ON DISCHARGE:  Fair.   DISCHARGE INSTRUCTIONS:  Patient was instructed not to lift anything over 10  pounds for two weeks and not to drive for three days.  The patient was  advised to follow a heart healthy diet.  The patient was instructed that he  may shower, but not take  baths.  The patient was advised to avoid strenuous  activity.   FOLLOW-UP:  Patient has a follow-up appointment with Dr. Esperanza Richters  at the [facility] on April 06, 2005 at 3:30 p.m.  The patient has an appointment with Dr. Gwenlyn Found at [facility] on April 14, 2005 at 9 a.m.     ______________________________  Brion Aliment    ______________________________  Billey Chang, M.D.    JH/MEDQ  D:  04/01/2005  T:  04/02/2005  Job:  [id number]   cc:   Esperanza Richters, MD  Fax: [phone]

## 2010-12-09 ENCOUNTER — Other Ambulatory Visit: Payer: Self-pay | Admitting: Family Medicine

## 2010-12-09 DIAGNOSIS — I10 Essential (primary) hypertension: Secondary | ICD-10-CM

## 2010-12-09 DIAGNOSIS — I2510 Atherosclerotic heart disease of native coronary artery without angina pectoris: Secondary | ICD-10-CM

## 2010-12-09 MED ORDER — FUROSEMIDE 40 MG PO TABS: 20.0000 mg | ORAL_TABLET | Freq: Every day | ORAL | Status: DC

## 2010-12-09 MED ORDER — METOPROLOL TARTRATE 25 MG PO TABS: 25.0000 mg | ORAL_TABLET | Freq: Two times a day (BID) | ORAL | Status: DC

## 2010-12-25 ENCOUNTER — Other Ambulatory Visit: Payer: Self-pay | Admitting: *Deleted

## 2010-12-25 DIAGNOSIS — I2510 Atherosclerotic heart disease of native coronary artery without angina pectoris: Secondary | ICD-10-CM

## 2010-12-25 MED ORDER — CLOPIDOGREL BISULFATE 75 MG PO TABS: 75.0000 mg | ORAL_TABLET | Freq: Every day | ORAL | Status: DC

## 2010-12-25 NOTE — Telephone Encounter (Signed)
Refill request  for Plavix from [facility]. Spoke with patient and advised him that we refill for one month and he needs appointment before next refill. Appointment scheduled 01/02/2011.

## 2011-01-02 ENCOUNTER — Ambulatory Visit: Payer: Medicare Other | Admitting: Family Medicine

## 2011-01-05 ENCOUNTER — Encounter: Payer: Self-pay | Admitting: Family Medicine

## 2011-01-05 ENCOUNTER — Ambulatory Visit (INDEPENDENT_AMBULATORY_CARE_PROVIDER_SITE_OTHER): Payer: Medicare Other | Admitting: Family Medicine

## 2011-01-05 DIAGNOSIS — I10 Essential (primary) hypertension: Secondary | ICD-10-CM

## 2011-01-05 DIAGNOSIS — J449 Chronic obstructive pulmonary disease, unspecified: Secondary | ICD-10-CM

## 2011-01-05 DIAGNOSIS — I2510 Atherosclerotic heart disease of native coronary artery without angina pectoris: Secondary | ICD-10-CM

## 2011-01-05 DIAGNOSIS — E119 Type 2 diabetes mellitus without complications: Secondary | ICD-10-CM

## 2011-01-05 MED ORDER — CLOPIDOGREL BISULFATE 75 MG PO TABS
75.0000 mg | ORAL_TABLET | Freq: Every day | ORAL | Status: DC
Start: 2011-01-05 — End: 2011-01-05

## 2011-01-05 MED ORDER — ERGOCALCIFEROL 1.25 MG (50000 UT) PO CAPS: 50000.0000 [IU] | ORAL_CAPSULE | ORAL | Status: DC

## 2011-01-05 MED ORDER — POTASSIUM CHLORIDE ER 10 MEQ PO TBCR: 20.0000 meq | EXTENDED_RELEASE_TABLET | Freq: Every day | ORAL | Status: DC

## 2011-01-05 MED ORDER — METOPROLOL TARTRATE 25 MG PO TABS: 25.0000 mg | ORAL_TABLET | Freq: Two times a day (BID) | ORAL | Status: DC

## 2011-01-05 MED ORDER — CLOPIDOGREL BISULFATE 75 MG PO TABS: 75.0000 mg | ORAL_TABLET | Freq: Every day | ORAL | Status: DC

## 2011-01-05 MED ORDER — ATORVASTATIN CALCIUM 20 MG PO TABS
20.0000 mg | ORAL_TABLET | Freq: Every day | ORAL | Status: DC
Start: 2011-01-05 — End: 2011-07-13

## 2011-01-05 NOTE — Patient Instructions (Addendum)
Diabetes, Frequently Asked Questions WHAT IS DIABETES? Most of the food we eat is turned into glucose (sugar). Our bodies use it for energy. The pancreas makes a hormone called insulin. It helps glucose get into the cells of our bodies. When you have diabetes, your body either does not make enough insulin or cannot use its own insulin as well as it should. This causes sugars to build up in your blood. WHAT ARE THE SYMPTOMS OF DIABETES?  Frequent urination.   Excessive thirst.   Unexplained weight loss.   Extreme hunger.   Blurred vision.   Tingling or numbness in hands or feet.   Feeling very tired much of the time.   Dry, itchy skin.   Sores that are slow to heal.   Yeast infections.   WHAT ARE THE TYPES OF DIABETES? Type 1 Diabetes   About 10% of affected people have this type.   Usually occurs before the age of 30.   Usually occurs in thin to normal weight people.  Type 2 Diabetes  About 90% of affected people have this type.   Usually occurs after the age of 40.   Usually occurs in overweight people.   More likely to have:   A family history of diabetes.   A history of diabetes during pregnancy (gestational diabetes).   High blood pressure.   High cholesterol and triglycerides.  Gestational Diabetes  Occurs in about 4% of pregnancies.   Usually goes away after the baby is born.   More likely to occur in women with:   Family history of diabetes.   Previous gestational diabetes.   Obese.   Over 25 years old.  WHAT IS PRE-DIABETES? Pre-diabetes means your blood glucose is higher than normal, but lower than the diabetes range. It also means you are at risk of getting type 2 diabetes and heart disease. If you are told you have pre-diabetes, have your blood glucose checked again in 1 to 2 years. WHAT IS THE TREATMENT FOR DIABETES? Treatment is aimed at keeping blood glucose near normal levels at all times. Learning how to manage this yourself is  important in treating diabetes. Depending on the type of diabetes you have, your treatment will include one or more of the following:  Monitoring your blood glucose.   Meal planning.   Exercise.   Oral medicine (pills) or insulin.  CAN DIABETES BE PREVENTED? With type 1 diabetes, prevention is more difficult, because the triggers that cause it are not yet known. With type 2 diabetes, prevention is more likely, with lifestyle changes:  Maintain a healthy weight.   Eat healthy.   Exercise.  IS THERE A CURE FOR DIABETES? No, there is no cure for diabetes. There is a lot of research going on that is looking for a cure, and progress is being made. Diabetes can be treated and controlled. People with diabetes can manage their diabetes and lead normal, active lives. SHOULD I BE TESTED FOR DIABETES? If you are at least 68 years old, you should be tested for diabetes. You should be tested again every 3 years. If you are 45 or older and overweight, you may want to get tested more often. If you are younger than 45, overweight, and have one or more of the following risk factors, you should be tested:  Family history of diabetes.   Inactive lifestyle.   High blood pressure.  WHAT ARE SOME OTHER SOURCES FOR INFORMATION ON DIABETES? The following organizations may help in your search   for more information on diabetes: National Diabetes Education Program (NDEP) Internet: BloggerBowl.es Telephone toll free: 513-370-1416 American Diabetes Association Telephone: 99991111 To order publications toll free: 1-800-ADA-ORDER  Diabetes information: 1-518-541-9773 (800-DIABETES)  Internet: http://www.diabetes.org  Juvenile Diabetes [employer] International Telephone: 5615280721 Internet: AffordableSalon.es Document Released: 05/21/2003 Document Re-Released: 05/06/2009 [facility] Patient Information 2011 [place].Eating Away From Home with Diabetes  There are times when  you will eat in a restaurant or you will have meals that have been prepared by someone else. You can enjoy eating out. Note that the portions in restaurants may be much larger than needed. Listed below are some ideas to help you choose foods that will keep your blood sugar in better control.  HINTS FOR EATING OUT  Know your meal plan and how many carbohydrate choices you should have at each meal. You may wish to carry a copy of your meal plan in your purse or wallet. Learn the foods included in each food group.   Make a list of restaurants near you that offer healthy choices and pick up carry out menus to see what they offer. You can then plan what you will order ahead of time.   Become familiar with serving sizes by practicing them at home using measuring cups and spoons. Once you learn to recognize portion sizes, you will be able to correctly estimate the amount of total carbohydrate you are allowed to eat at the restaurant. Ask for a "to-go box" if the portion is more than you should have. When your food comes, leave the amount you should have on the plate, and put the rest in the to-go box before you start eating.   Plan ahead if your meal time will be different than usual. Check with your caregiver to find out how to time meals and medication if you are taking insulin.   Avoid high fat foods such as fried foods, cream sauces, full fat salad dressings or any added butter or margarine.   Do not be afraid to ask questions. Ask your server about the portion size, cooking methods, ingredients and if items can be substituted. Restaurants do not list all available items on the menu. You can ask for your main entree to be prepared using skim milk, oil instead of butter or margarine, and without gravy or sauces. Ask your waiter or waitress to serve salad dressings, gravy, sauces, margarine, and sour cream on the side. You can then add the amount your meal plan suggests.   Add more vegetables whenever  possible.   Avoid items that are labeled "jumbo," "giant" deluxe" or" super sized."   You may want to split an entre with someone and order an extra side salad.   Watch for hidden calories in foods like croutons, bacon or cheese.   Ask your server to take away the bread basket or chips from your table.   Order a dinner salad as an appetizer.  You can eat most foods served in a restaurant. Some foods are better choices than others. FOOD GROUP: Breads & Starches  RECOMMENDED: All kinds of bread (wheat, rye, white, oatmeal, [place], [place], raisin); hard or soft dinner rolls; frankfurter or hamburger buns; small bagels, small corn or whole wheat flour tortillas.  AVOID/USE SPARINGLY: Frosted or glazed breads; butter rolls; egg or cheese breads; croissants; sweet rolls; pastries; coffee cake; glazed or frosted doughnuts, muffins. FOOD GROUP: Crackers RECOMMENDED: Animal crackers, graham, rye, saltine, oyster, and matzoth crackers. Bread sticks, melba toast; rusks, pretzels, popcorn (without fat), zwieback  toast. AVOID/USE SPARINGLY: High-fat snack crackers or chips. Buttered popcorn. FOOD GROUP: Cereals RECOMMENDED: Hot and cold cereals. Whole grains such as oatmeal or shredded wheat are good choices. AVOID/USE SPARINGLY: Sugar-coated or granola type cereals. FOOD GROUP:Potatoes/Pasta/Rice/Beans RECOMMENDED: Order baked, boiled, or mashed potatoes; rice or noodles without added fat; whole beans. Order gravies, butter, margarine, or sauces on the side so you can control the amount you add.  AVOID/USE SPARINGLY: Hash brown or fried potatoes. Potatoes, pasta, or rice prepared with cream or cheese sauce. Potato or pasta salads prepared with large amounts of dressing. Fried beans or fried rice. FOOD GROUP: Vegetables RECOMMENDED: Order steamed, baked, boiled, or stewed vegetables without sauces or extra fat. Ask that sauce be served on the side. If vegetables are not listed on the menu, ask what is  available.  AVOID/USE SPARINGLY: Vegetables prepared with cream, butter, or cheese sauce. Fried vegetables. FOOD GROUP: Salad Bars RECOMMENDED: Many of the vegetables at a salad bar are considered "free." Use lemon juice, vinegar, or low-calorie salad dressing (fewer than 20 calories per serving) as "free" dressings for your salad. Look for salad bar ingredients that have no added fat or sugar such as tomatoes, lettuce, cucumbers, brocolli, carrots, onions and mushrooms. AVOID/USE SPARINGLY: Prepared salads with large amounts of dressing, such as coleslaw, caesar salad, macaroni salad, bean salad or carrot salad.  FOOD GROUP: Fruit RECOMMENDED: Eat fresh fruit or fresh fruit salad without added dressing. A salad bar often offers fresh fruit choices, but canned fruit at a restaurant is usually packed in sugar or syrup. AVOID/USE SPARINGLY: Sweetened canned or frozen fruits, plain or sweetened fruit juice. Fruit salads with dressing, sour cream, or sugar added to them. FOOD GROUP: Meat & Substitutes RECOMMENDED: Order broiled, baked, roasted, or grilled meat, poultry, or fish. Trim off all visible fat. Do not eat the skin of poultry. The size stated on the menu is the raw weight; meat shrinks by 1/4 in cooking (for example, 4 oz. raw equals 3 oz. cooked meat). AVOID/USE SPARINGLY: Deep-fat fried meat, poultry, or fish. Breaded meats. FOOD GROUP: Eggs RECOMMENDED: Order soft or hard cooked eggs, or poached, or scrambled. Omelets may be okay, depending on what ingredients are added. Egg substitutes are also a good choice.  AVOID/USE SPARINGLY: Fried eggs, eggs prepared with cream or cheese sauce. FOOD GROUP: Milk RECOMMENDED: Order lowfat or fat free milk according to your meal plan. Plain, nonfat yogurt or flavored yogurt with no sugar added may be used as a substitute for milk. Soy milk may also be used. AVOID/USE SPARINGLY: Milk shakes or sweetened milk beverages. FOOD GROUP: Soups & Combination  Foods RECOMMENDED: Clear broth or consomm are free foods and may be used as an appetizer. Broth-based soups with fat removed count as a starch serving and are preferred over cream soups. Soups made with beans or split peas may be eaten but count as a starch. AVOID/USE SPARINGLY: Fatty soups; soup made with cream; cheese soup. Combination foods prepared with excessive amounts of fat or with cream or cheese sauces. FOOD GROUP: Desserts & Sweets RECOMMENDED: Ask for fresh fruit. Sponge or angel food cake without icing, ice milk, no sugar added ice cream, sherbet, or frozen yogurt may fit into your meal plan occasionally. AVOID/USE SPARINGLY: Pastries, puddings, pies, cakes with icing, custard, gelatin desserts. FOOD GROUP: Fats & Oils RECOMMENDED: Choose healthy fats such as olive oil, canola oil or tub margarine, reduced fat or fat free sour cream, cream cheese, avocado or nuts. AVOID/USE SPARINGLY:  Any fats in excess of your allowed portion. Deep-fried foods or any food with a large amount of fat. NOTE Ask for all fats to be served on the side, and limit your portion sizes according to your meal plan. Document Released: 05/18/2005 Document Re-Released: 03/14/2009 [facility] Patient Information 2011 [place].

## 2011-01-05 NOTE — Progress Notes (Signed)
  Subjective:    Patient ID: Matthew Edwards, male    DOB: September 04, 1940, 70 y.o.   MRN: [id number]  Diabetes He presents for his follow-up diabetic visit. He has type 2 diabetes mellitus. There are no hypoglycemic associated symptoms. Pertinent negatives for diabetes include no blurred vision, no chest pain, no fatigue, no polyuria and no weakness. There are no hypoglycemic complications. Symptoms are stable. There are no diabetic complications. Risk factors for coronary artery disease include diabetes mellitus, hypertension, male sex, sedentary lifestyle and tobacco exposure. Current diabetic treatment includes oral agent (monotherapy). He is compliant with treatment most of the time. His weight is stable. He is following a diabetic and generally healthy diet. Meal planning includes avoidance of concentrated sweets, carbohydrate counting and ADA exchanges. His breakfast blood glucose is taken between 7-8 am. His breakfast blood glucose range is generally 130-140 mg/dl. His lunch blood glucose is taken between 12-1 pm. His lunch blood glucose range is generally 140-180 mg/dl. His dinner blood glucose is taken between 5-6 pm. His highest blood glucose is 140-180 mg/dl. His overall blood glucose range is 130-140 mg/dl. An ACE inhibitor/angiotensin II receptor blocker is not being taken. He does not see a podiatrist.Eye exam is not current.  Hypertension This is a chronic problem. Pertinent negatives include no blurred vision or chest pain. There are no associated agents to hypertension. Past treatments include beta blockers.      Review of Systems  Constitutional: Negative for chills and fatigue.  Eyes: Negative for blurred vision.  Cardiovascular: Negative for chest pain.  Gastrointestinal: Negative for abdominal distention.  Genitourinary: Negative for polyuria.  Neurological: Negative for weakness.       Objective:   Physical Exam  Constitutional: He appears well-developed and well-nourished.    HENT:  Head: Normocephalic and atraumatic.  Neck: Normal range of motion.  Cardiovascular: Normal rate.   Pulmonary/Chest: Effort normal.  Abdominal: Soft.          Assessment & Plan:  Diabetes- new onset, still working on diet and meds.  BS look fairly good. HTN-could be better, pt. Will need ACE-I added at next visit.

## 2011-01-15 ENCOUNTER — Ambulatory Visit (INDEPENDENT_AMBULATORY_CARE_PROVIDER_SITE_OTHER): Payer: Medicare Other | Admitting: Home Health Services

## 2011-01-15 ENCOUNTER — Encounter: Payer: Self-pay | Admitting: Home Health Services

## 2011-01-15 VITALS — BP 130/80 | HR 64 | Temp 97.0°F | Ht 69.0 in | Wt 235.0 lb

## 2011-01-15 DIAGNOSIS — Z0000 Encounter for general adult medical examination without abnormal findings: Secondary | ICD-10-CM

## 2011-01-15 NOTE — Progress Notes (Signed)
Patient here for annual wellness visit, patient reports: Risk Factors/Conditions needing evaluation or treatment: Pt does not have any risk factors that need evaluation. Home Safety: Pt lives by self in 1 story home.  Pt reports having smoke detector and adaptive equipment in bathroom. Other Information: Corrective lens:  Pt wears daily corrective lens and visits eye doctor as needed. Dentures: Pt does not have dentures. Memory: Pt reports some memory loss.  Patient's Mini Mental Score (recorded in doc. flowsheet): 28  Balance/Gait: Pt recently had open heart surgery and is still stiff and uses cane occasionally. Balance Abnormal Patient value  Sitting balance    Sit to stand x Uses arms  Attempts to arise    Immediate standing balance    Standing balance    Nudge    Eyes closed- Romberg x dizzy  Tandem stance x unable  Back lean x dizzy  Neck Rotation    360 degree turn    Sitting down x Uses arms   Gait Abnormal Patient value  Initiation of gait    Step length-left    Step length-right    Step height-left    Step height-right    Step symmetry    Step continuity    Path deviation    Trunk movement x stiff  Walking stance        Annual Wellness Visit Requirements Recorded Today In  Medical, family, social history Past Medical, Family, Social History Section  Current providers Care team  Current medications Medications  Wt, BP, Ht, BMI Vital signs  Hearing assessment (welcome visit) Hearing/vision  Tobacco, alcohol, illicit drug use History  ADL Nurse Assessment  Depression Screening Nurse Assessment  Cognitive impairment Nurse Assessment  Mini Mental Status Document Flowsheet  Fall Risk Nurse Assessment  Home Safety Progress Note  End of Life Planning (welcome visit) Social Documentation  Medicare preventative services Progress Note  Risk factors/conditions needing evaluation/treatment Progress Note  Personalized health advice Patient Instructions, goals,  letter  Diet & Exercise Social Documentation  Emergency Contact Social Documentation  Seat Belts Social Documentation  Sun exposure/protection Social Documentation    Medicare Prevention Plan: Recommended pt contact pharmacy for shingles vaccine.  Recommended Medicare Prevention Screenings Men over 45 Test For Frequency Date of Last- BOLD if needed  Colorectal Cancer 1-10 yrs 9/9  Prostate Cancer Never or yearly 12/10  Aortic Aneurysm Once if 65-75 with hx of smoking   Cholesterol 5 yrs 12/10  Diabetes yearly 2/12  HIV yearly declined  Influenza Shot yearly 10/10  Pneumonia Shot once Pt reported done when in hospital 3/12  Zostavax Shot once recommended

## 2011-01-15 NOTE — Patient Instructions (Signed)
1. Continue to monitor your blood sugar every morning before you eat. 2. Focus on eating 3-4 vegetables a day and eating small meals every 3-4 hours. 3. Continue to work on losing weight by moving more every day. 4. Consult with your cardiologist about your teeth.

## 2011-01-19 NOTE — Progress Notes (Signed)
I have reviewed this visit and discussed with Suzanne Lineberry and agree with her documentation  

## 2011-01-30 ENCOUNTER — Telehealth: Payer: Self-pay | Admitting: *Deleted

## 2011-01-30 DIAGNOSIS — I2510 Atherosclerotic heart disease of native coronary artery without angina pectoris: Secondary | ICD-10-CM

## 2011-01-30 NOTE — Telephone Encounter (Signed)
Refill request received from [facility]  for Plavix 75 mg . Will forward to MD .

## 2011-02-03 ENCOUNTER — Telehealth: Payer: Self-pay | Admitting: *Deleted

## 2011-02-03 MED ORDER — CLOPIDOGREL BISULFATE 75 MG PO TABS: 75.0000 mg | ORAL_TABLET | Freq: Every day | ORAL | Status: DC

## 2011-02-03 NOTE — Telephone Encounter (Signed)
[facility] by [name]

## 2011-02-05 ENCOUNTER — Telehealth: Payer: Self-pay | Admitting: Family Medicine

## 2011-02-05 NOTE — Telephone Encounter (Signed)
Received request for referrals will send.

## 2011-02-05 NOTE — Telephone Encounter (Signed)
Ms. Matthew Edwards is asking for a referral to a podiatrist for her father's toenails.  Also need a referral to a dentist per his cardiologist.  Need a dental referral first asap.

## 2011-02-06 NOTE — Telephone Encounter (Signed)
LMOVM of Daughter informing her that I would send info about referral to podiatry.  Also that her fathers insurance does not cover dentist, so he would be considered self pay and could go wherever he would like [employer], [employer]

## 2011-02-11 ENCOUNTER — Other Ambulatory Visit (INDEPENDENT_AMBULATORY_CARE_PROVIDER_SITE_OTHER): Payer: Medicare Other

## 2011-02-11 DIAGNOSIS — E119 Type 2 diabetes mellitus without complications: Secondary | ICD-10-CM

## 2011-02-11 LAB — POCT GLYCOSYLATED HEMOGLOBIN (HGB A1C): Hemoglobin A1C: 5.7

## 2011-02-11 NOTE — Progress Notes (Signed)
A1c done today.Shaniqua Guillot, Kevin Fenton

## 2011-03-02 LAB — CBC
HCT: 46.9
Hemoglobin: 15.5
MCHC: 33.1
MCV: 88.9
Platelets: 143 — ABNORMAL LOW
RBC: 5.28
RDW: 13.6
WBC: 5.6

## 2011-03-02 LAB — PROTIME-INR
INR: 1
Prothrombin Time: 13.7

## 2011-03-09 ENCOUNTER — Other Ambulatory Visit: Payer: Self-pay | Admitting: Family Medicine

## 2011-03-09 DIAGNOSIS — I2510 Atherosclerotic heart disease of native coronary artery without angina pectoris: Secondary | ICD-10-CM

## 2011-03-09 MED ORDER — METOPROLOL TARTRATE 25 MG PO TABS: 25.0000 mg | ORAL_TABLET | Freq: Two times a day (BID) | ORAL | Status: DC

## 2011-03-10 ENCOUNTER — Telehealth: Payer: Self-pay | Admitting: *Deleted

## 2011-03-10 NOTE — Telephone Encounter (Signed)
Matthew Edwards with [facility] calling.  States they faxed over a refill request for Toprol XL 25 mg take 1/2 tablet by mouth daily, #100. Last filled on 09/04/10.  States someone named Leanne called in Metoprolol Tartrate 25 mg, take one tablet by mouth daily, #60 with 11 refills.  [facility] is unable to get the Metoprolol for free for the patient but can get the Toprol XL.  Call [name].  Will have patient take Toprol XL 25 mg, take one by mouth daily.  Rx called to [facility] at [phone].  [name]

## 2011-03-10 NOTE — Progress Notes (Signed)
rx called to pharmacy for metoprolol tartrate.

## 2011-03-18 LAB — CBC
HCT: 45.6
HCT: 47.9
HCT: 48.3
Hemoglobin: 15.3
Hemoglobin: 15.8
Hemoglobin: 16.1
MCHC: 33
MCHC: 33.3
MCHC: 33.6
MCV: 88.8
MCV: 89
MCV: 89.1
Platelets: 120 — ABNORMAL LOW
Platelets: 123 — ABNORMAL LOW
Platelets: 131 — ABNORMAL LOW
RBC: 5.13
RBC: 5.38
RBC: 5.41
RDW: 12.8
RDW: 13
RDW: 13.6
WBC: 5.2
WBC: 6
WBC: 7.9

## 2011-03-18 LAB — BASIC METABOLIC PANEL
BUN: 13
CO2: 27
Calcium: 10.4
Chloride: 103
Creatinine, Ser: 1.14
GFR calc Af Amer: >60
GFR calc non Af Amer: >60
Glucose, Bld: 109 — ABNORMAL HIGH
Potassium: 3.9
Sodium: 137

## 2011-03-18 LAB — COMPREHENSIVE METABOLIC PANEL
ALT: 22
AST: 22
Albumin: 3.8
Alkaline Phosphatase: 62
BUN: 12
CO2: 27
Calcium: 10.6 — ABNORMAL HIGH
Chloride: 102
Creatinine, Ser: 0.9
GFR calc Af Amer: >60
GFR calc non Af Amer: >60
Glucose, Bld: 110 — ABNORMAL HIGH
Potassium: 3.7
Sodium: 138
Total Bilirubin: 0.8
Total Protein: 6.8

## 2011-03-18 LAB — CK TOTAL AND CKMB (NOT AT ARMC)
CK, MB: 1.4
Relative Index: INVALID
Total CK: 63

## 2011-03-18 LAB — PROTIME-INR
INR: 1
Prothrombin Time: 13.7

## 2011-03-18 LAB — CARDIAC PANEL(CRET KIN+CKTOT+MB+TROPI)
CK, MB: 1.4
Relative Index: INVALID
Total CK: 74
Troponin I: 0.03

## 2011-03-18 LAB — TROPONIN I: Troponin I: 0.03

## 2011-03-18 LAB — APTT: aPTT: 40 — ABNORMAL HIGH

## 2011-03-19 LAB — DIFFERENTIAL
Basophils Absolute: 0
Basophils Relative: 1
Eosinophils Absolute: 0.1
Eosinophils Relative: 2
Lymphocytes Relative: 29
Lymphs Abs: 1.7
Monocytes Absolute: 0.5
Monocytes Relative: 8
Neutro Abs: 3.5
Neutrophils Relative %: 60

## 2011-03-19 LAB — CK TOTAL AND CKMB (NOT AT ARMC)
CK, MB: 1.4
Relative Index: INVALID
Total CK: 71

## 2011-03-19 LAB — URINALYSIS, ROUTINE W REFLEX MICROSCOPIC
Bilirubin Urine: NEGATIVE
Glucose, UA: NEGATIVE
Hgb urine dipstick: NEGATIVE
Ketones, ur: NEGATIVE
Nitrite: NEGATIVE
Protein, ur: NEGATIVE
Specific Gravity, Urine: 1.022
Urobilinogen, UA: 0.2
pH: 6.5

## 2011-03-19 LAB — RAPID URINE DRUG SCREEN, HOSP PERFORMED
Amphetamines: NOT DETECTED
Barbiturates: NOT DETECTED
Benzodiazepines: NOT DETECTED
Cocaine: NOT DETECTED
Opiates: NOT DETECTED
Tetrahydrocannabinol: NOT DETECTED

## 2011-03-19 LAB — CARDIAC PANEL(CRET KIN+CKTOT+MB+TROPI)
CK, MB: 1.4
CK, MB: 1.4
CK, MB: 1.5
Relative Index: INVALID
Relative Index: INVALID
Relative Index: INVALID
Total CK: 71
Total CK: 74
Total CK: 75
Troponin I: 0.02
Troponin I: 0.02
Troponin I: 0.03

## 2011-03-19 LAB — HEPATIC FUNCTION PANEL
ALT: 21
AST: 28
Albumin: 3.8
Alkaline Phosphatase: 76
Bilirubin, Direct: 0.4 — ABNORMAL HIGH
Indirect Bilirubin: 0.5
Total Bilirubin: 0.9
Total Protein: 6.6

## 2011-03-19 LAB — I-STAT 8, (EC8 V) (CONVERTED LAB)
Acid-Base Excess: 2
BUN: 12
Bicarbonate: 25 — ABNORMAL HIGH
Chloride: 107
Glucose, Bld: 91
HCT: 49
Hemoglobin: 16.7
Operator id: 282201
Potassium: 5
Sodium: 138
TCO2: 26
pCO2, Ven: 35.1 — ABNORMAL LOW
pH, Ven: 7.461 — ABNORMAL HIGH

## 2011-03-19 LAB — CBC
HCT: 46
HCT: 46.8
Hemoglobin: 15.5
Hemoglobin: 15.6
MCHC: 33.3
MCHC: 33.6
MCV: 88.4
MCV: 88.7
Platelets: 130 — ABNORMAL LOW
Platelets: 134 — ABNORMAL LOW
RBC: 5.19
RBC: 5.29
RDW: 12.9
RDW: 13.3
WBC: 5.8
WBC: 5.8

## 2011-03-19 LAB — B-NATRIURETIC PEPTIDE (CONVERTED LAB): Pro B Natriuretic peptide (BNP): 95

## 2011-03-19 LAB — TSH: TSH: 1.386

## 2011-03-19 LAB — LIPID PANEL
Cholesterol: 110
HDL: 35 — ABNORMAL LOW
LDL Cholesterol: 67
Total CHOL/HDL Ratio: 3.1
Triglycerides: 42
VLDL: 8

## 2011-03-19 LAB — POCT CARDIAC MARKERS
CKMB, poc: <1 — ABNORMAL LOW
Myoglobin, poc: 50.8
Operator id: 282201
Troponin i, poc: <0.05

## 2011-03-19 LAB — POCT I-STAT CREATININE
Creatinine, Ser: 1
Operator id: 282201

## 2011-03-19 LAB — TROPONIN I: Troponin I: 0.01

## 2011-03-19 LAB — D-DIMER, QUANTITATIVE: D-Dimer, Quant: <0.22

## 2011-03-30 ENCOUNTER — Other Ambulatory Visit: Payer: Self-pay | Admitting: Family Medicine

## 2011-03-30 MED ORDER — GLIPIZIDE 10 MG PO TABS: 10.0000 mg | ORAL_TABLET | Freq: Two times a day (BID) | ORAL | Status: DC

## 2011-03-31 NOTE — Progress Notes (Signed)
RX for glipizide called to pharmacy.

## 2011-04-14 ENCOUNTER — Telehealth: Payer: Self-pay | Admitting: Family Medicine

## 2011-04-14 DIAGNOSIS — J449 Chronic obstructive pulmonary disease, unspecified: Secondary | ICD-10-CM

## 2011-04-14 MED ORDER — BUDESONIDE-FORMOTEROL FUMARATE 80-4.5 MCG/ACT IN AERO: 2.0000 | INHALATION_SPRAY | Freq: Two times a day (BID) | RESPIRATORY_TRACT | Status: DC

## 2011-04-14 NOTE — Telephone Encounter (Signed)
Patient needs a refill of Symbicort sent to [facility].  He said he contacted them about it, but they told him to get in touch with his dr.

## 2011-04-14 NOTE — Telephone Encounter (Signed)
Handicap Placard placed in Dr .Virginia Crews box for completion/signature.  [name]

## 2011-04-14 NOTE — Telephone Encounter (Signed)
RX called to pharmacy for [employer]

## 2011-04-14 NOTE — Telephone Encounter (Signed)
Patient dropped off handicapped placard forms to be completed.  Please call when ready to be picked up.

## 2011-06-02 DIAGNOSIS — Z955 Presence of coronary angioplasty implant and graft: Secondary | ICD-10-CM

## 2011-06-02 HISTORY — DX: Presence of coronary angioplasty implant and graft: Z95.5

## 2011-06-24 ENCOUNTER — Encounter: Payer: Self-pay | Admitting: Family Medicine

## 2011-06-24 ENCOUNTER — Ambulatory Visit (INDEPENDENT_AMBULATORY_CARE_PROVIDER_SITE_OTHER): Payer: Medicare HMO | Admitting: Family Medicine

## 2011-06-24 VITALS — BP 128/76 | HR 62 | Temp 98.0°F | Ht 69.0 in | Wt 231.3 lb

## 2011-06-24 DIAGNOSIS — I2510 Atherosclerotic heart disease of native coronary artery without angina pectoris: Secondary | ICD-10-CM

## 2011-06-24 DIAGNOSIS — I10 Essential (primary) hypertension: Secondary | ICD-10-CM

## 2011-06-24 DIAGNOSIS — E119 Type 2 diabetes mellitus without complications: Secondary | ICD-10-CM

## 2011-06-24 DIAGNOSIS — Z23 Encounter for immunization: Secondary | ICD-10-CM

## 2011-06-24 DIAGNOSIS — I359 Nonrheumatic aortic valve disorder, unspecified: Secondary | ICD-10-CM

## 2011-06-24 DIAGNOSIS — M549 Dorsalgia, unspecified: Secondary | ICD-10-CM

## 2011-06-24 DIAGNOSIS — H8110 Benign paroxysmal vertigo, unspecified ear: Secondary | ICD-10-CM

## 2011-06-24 LAB — POCT GLYCOSYLATED HEMOGLOBIN (HGB A1C): Hemoglobin A1C: 6

## 2011-06-24 MED ORDER — TETANUS-DIPHTH-ACELL PERTUSSIS 5-2.5-18.5 LF-MCG/0.5 IM SUSP: 0.5000 mL | Freq: Once | INTRAMUSCULAR | Status: DC

## 2011-06-24 MED ORDER — PNEUMOCOCCAL VAC POLYVALENT 25 MCG/0.5ML IJ INJ: 0.5000 mL | INJECTION | INTRAMUSCULAR | Status: AC

## 2011-06-24 MED ORDER — CLOPIDOGREL BISULFATE 75 MG PO TABS: 75.0000 mg | ORAL_TABLET | Freq: Every day | ORAL | Status: DC

## 2011-06-24 NOTE — Progress Notes (Signed)
  Subjective:    Patient ID: Matthew Edwards, male    DOB: 1940-10-29, 71 y.o.   MRN: [id number]  HPI Feeling some dizzy with rising. Has h/o vertigo.  No low blood sugars noted.  Needs opthalmology referral.  Has to chance cardiologists because of his new insurance.  Is getting his teeth extracted by dentist.  Will need abx, and to stop plavix and asa 5-7 days prior.   BS have been good.  119-165.  Following diet well.  Taking Glyburide once daily and not twice.   Review of Systems  Constitutional: Negative for fever and chills.  HENT: Negative for congestion, rhinorrhea and sneezing.   Respiratory: Negative for cough and wheezing.   Cardiovascular: Negative for leg swelling.  Neurological: Positive for dizziness. Negative for headaches.       Objective:   Physical Exam  Vitals reviewed. Constitutional: He appears well-developed and well-nourished.  HENT:  Head: Atraumatic.  Eyes: No scleral icterus.  Cardiovascular: Normal rate and regular rhythm.   Pulmonary/Chest: Effort normal.  Abdominal: Soft.   Filed Vitals:   06/24/11 1020  Height: 5\' 9"  (1.753 m)  Weight: 231 lb 4.8 oz (104.917 kg)  Orthostatics are negative. Hgb A1 C is 6.0 today       Assessment & Plan:   1. Type II or unspecified type diabetes mellitus without mention of complication, not stated as uncontrolled  POCT HgB A1C, Ambulatory referral to Ophthalmology  2. Coronary artery disease  clopidogrel (PLAVIX) 75 MG tablet, Ambulatory referral to Cardiology  3. AORTIC STENOSIS  Ambulatory referral to Cardiology  4. BACK PAIN    5. BENIGN POSITIONAL VERTIGO    6. HYPERTENSION, ESSENTIAL NOS    7. Need for prophylactic vaccination with combined diphtheria-tetanus-pertussis (DTP) vaccine  TDaP (BOOSTRIX) injection 0.5 mL  8. Need for prophylactic vaccination against Streptococcus pneumoniae (pneumococcus)  pneumococcal 23 valent vaccine (PNU-IMMUNE) injection 0.5 mL

## 2011-06-24 NOTE — Patient Instructions (Signed)
Diabetes and Exercise Regular exercise is important and can help:   Control blood glucose (sugar).   Decrease blood pressure.    Control blood lipids (cholesterol, triglycerides).   Improve overall health.  BENEFITS FROM EXERCISE  Improved fitness.   Improved flexibility.   Improved endurance.   Increased bone density.   Weight control.   Increased muscle strength.   Decreased body fat.   Improvement of the body's use of insulin, a hormone.   Increased insulin sensitivity.   Reduction of insulin needs.   Reduced stress and tension.   Helps you feel better.  People with diabetes who add exercise to their lifestyle gain additional benefits, including:  Weight loss.   Reduced appetite.   Improvement of the body's use of blood glucose.   Decreased risk factors for heart disease:   Lowering of cholesterol and triglycerides.   Raising the level of good cholesterol (high-density lipoproteins, HDL).   Lowering blood sugar.   Decreased blood pressure.  TYPE 1 DIABETES AND EXERCISE  Exercise will usually lower your blood glucose.   If blood glucose is greater than 240 mg/dl, check urine ketones. If ketones are present, do not exercise.   Location of the insulin injection sites may need to be adjusted with exercise. Avoid injecting insulin into areas of the body that will be exercised. For example, avoid injecting insulin into:   The arms when playing tennis.   The legs when jogging. For more information, discuss this with your caregiver.   Keep a record of:   Food intake.   Type and amount of exercise.   Expected peak times of insulin action.   Blood glucose levels.  Do this before, during, and after exercise. Review your records with your caregiver. This will help you to develop guidelines for adjusting food intake and insulin amounts.  TYPE 2 DIABETES AND EXERCISE  Regular physical activity can help control blood glucose.   Exercise is important  because it may:   Increase the body's sensitivity to insulin.   Improve blood glucose control.   Exercise reduces the risk of heart disease. It decreases serum cholesterol and triglycerides. It also lowers blood pressure.   Those who take insulin or oral hypoglycemic agents should watch for signs of hypoglycemia. These signs include dizziness, shaking, sweating, chills, and confusion.   Body water is lost during exercise. It must be replaced. This will help to avoid loss of body fluids (dehydration) or heat stroke.  Be sure to talk to your caregiver before starting an exercise program to make sure it is safe for you. Remember, any activity is better than none.  Document Released: 08/08/2003 Document Revised: 01/28/2011 Document Reviewed: 11/22/2008 [facility] Patient Information 2012 [place].Dental Extraction A dental extraction procedure refers to a routine tooth extraction performed by your dentist. The procedure depends on where and how the tooth is positioned. The procedure can be very quick, sometimes lasting only seconds. Reasons for dental extraction include:  Tooth decay.   Infections (abcesses).   The need to make room for other teeth.   Gum disease s where the supporting bone has been destroyed.   Fractures of the tooth leaving it unrestorable.   Extra teeth (supernumerary) or grossly malformed teeth.   Baby teeththat have not fallen out in time and have not permitted the the permanent teeth to erupt properly.   In preparation for braces where there is not enough room to align the teeth properly.   Not enough room for wisdom  teeth (particularly those that are impacted).   Prior to receiving radiation to the head and neck,teeth in the field of radiation may need to be extracted.  LET YOUR CAREGIVER KNOW ABOUT:  Any allergies.   All medicines you are taking:   Including herbs, eye drops, over-the-counter medications, and creams.   Blood thinners  (anticoagulants), aspirin or other drugs that may affect blood clotting.   Use of steroids (through mouth or as creams).   Previous problems with anesthetics, including local anesthetics.   History of bleeding or blood problems.   Previous surgery.   Possibility of pregnancy if this applies.   Smoking history.   Any health problems.  RISKS AND COMPLICATIONS As with any procedure, complications may occur, but they can usually be managed by your caregiver. General surgical complications may include:  Reaction to anesthesia.   Damage to surrounding teeth, nerves, tissues, or structures.   Infection.   Bleeding.  With appropriate treatment and care after surgery, the following complications are very uncommon:  Dry socket (blood clot does not form or stay in place over empty socket). This can delay healing.   Incomplete extraction of roots.   Jawbone injury, pain, or weakness.  BEFORE THE PROCEDURE  Your dental care provider will:   Take a medical and dental history.   Take an X-ray to evaluate the circumstances and how to best extract the tooth.   Do an oral exam.   Depending on the situation, antibiotics may be given before or after the extraction, or before and after.   Your caregivers may review the procedure, the local anaesthesia and/or sedation being used, and what to expect after the procedure with you.   If needed, your dentist may give you a form of sedation, either by medicine you swallow, gas, or intravenously (IV). This will help to relieve anxiety. Complicated extractions may require the use of general anaesthesia.  It is important to follow your caregiver's instructions prior to your procedure to avoid complications. Steps before your procedure may include:  Alert your caregiver if you feel ill (sore throat, fever, upset stomach, etc.) in the days leading up to your procedure.   Stop taking certain medications for several days prior to your procedure  such as blood thinners.   Take certain medications, such as antibiotics.   Avoid eating and drinking for several hours before the procedure. This will help you to avoid complications from the sedation or anaesthesia.   Sign a patient consent form.   Have a friend or family member drive you to the dentist and drive you home after the procedure.   Wear comfortable, loose clothing. Limit makeup and jewelry.   Quit smoking. If you are a smoker, this will raise the chances of a healing problem after your procedure. If you are thinking about quitting, talk to your surgeon about how long before the operation you should stop smoking. You may also get help from your primary caregiver.  PROCEDURE Dental extraction is typically done as an outpatient procedure. IV sedation, local anesthesia, or both may be used. It will keep you comfortable and free of pain during the procedure.  There are 2 types of extractions:  Simple extraction involves a tooth that is visible in the mouth and above the gum line. After local anesthetic is given by injection, and the area is numbed, the dentist will loosen the tooth with a special instrument (elevator). Then another instrument (forceps) will be used to grasp the tooth and remove  it from its socket. During the procedure you will feel some pressure, but you should not feel pain. If you do feel pain, tell your dentist. The open socket will be cleaned. Dressings (gauze) will be placed in the socket to reduce bleeding.   Surgical extractions are used if the tooth has not come into the mouth or the tooth is broken off below the gum line. The dentist will make a cut (incision) in the gum and may have to remove some of the bone around the tooth to aid in the removal of the tooth. After removal, stitches (sutures) may be required to close area to help in healing and control bleeding. For some surgical extractions, you may need a general anesthetic or IV sedation (through the vein).   After both types of extractions, you may be given pain medication or other drugs to help healing. Other post operative instructions will be given by your dental caregiver. AFTER THE PROCEDURE  You will have gauze in your mouth where the tooth was removed. Gentle pressure on the gauze for up to 1 hour will help to control bleeding.   A blood clot will begin to form over the open socket. This is normal. Do not touch the area or rinse it.   Your pain will be controlled with medication and self-care.   You will be given detailed instructions for care after surgery.  PROGNOSIS While some discomfort is normal after tooth extraction, most patients recover fully in just a few days. SEEK IMMEDIATE DENTAL CARE  You have uncontrolled bleeding, marked swelling, or severe pain.   You develop a fever, difficulty swallowing, or other severe symptoms.   You have questions or concerns.  Document Released: 05/18/2005 Document Revised: 01/28/2011 Document Reviewed: 08/22/2010 [facility] Patient Information 2012 [place].

## 2011-07-09 ENCOUNTER — Encounter: Payer: Self-pay | Admitting: Cardiology

## 2011-07-09 ENCOUNTER — Ambulatory Visit (INDEPENDENT_AMBULATORY_CARE_PROVIDER_SITE_OTHER): Payer: Medicare HMO | Admitting: Cardiology

## 2011-07-09 VITALS — BP 148/80 | HR 74 | Ht 69.0 in | Wt 236.4 lb

## 2011-07-09 DIAGNOSIS — E785 Hyperlipidemia, unspecified: Secondary | ICD-10-CM

## 2011-07-09 DIAGNOSIS — I10 Essential (primary) hypertension: Secondary | ICD-10-CM

## 2011-07-09 DIAGNOSIS — I359 Nonrheumatic aortic valve disorder, unspecified: Secondary | ICD-10-CM

## 2011-07-09 DIAGNOSIS — I2510 Atherosclerotic heart disease of native coronary artery without angina pectoris: Secondary | ICD-10-CM

## 2011-07-09 DIAGNOSIS — Z954 Presence of other heart-valve replacement: Secondary | ICD-10-CM

## 2011-07-09 DIAGNOSIS — Z952 Presence of prosthetic heart valve: Secondary | ICD-10-CM

## 2011-07-09 MED ORDER — BENAZEPRIL HCL 10 MG PO TABS: 10.0000 mg | ORAL_TABLET | Freq: Every day | ORAL | Status: DC

## 2011-07-09 NOTE — Assessment & Plan Note (Signed)
Blood pressure is not at goal today for diabetic. I recommended starting him on benazepril 10 mg per day.

## 2011-07-09 NOTE — Assessment & Plan Note (Signed)
He is status post prior stenting of the left circumflex coronary and LAD. He is on chronic aspirin and Plavix. He did have an LIMA placed to the LAD. We'll request his prior records.

## 2011-07-09 NOTE — Patient Instructions (Addendum)
Reduce Lasix to 20 mg daily.  We will add Benazepril 10 mg daily for blood pressure.  I will see you again in 6 months.

## 2011-07-09 NOTE — Progress Notes (Signed)
Matthew Edwards Date of Birth: 1941/01/24 Medical Record [id number]  History of Present Illness: [name] is seen at request of Dr. Kennon Edwards for establishment of cardiology followup. He is a former patient of Dr. Gwenlyn Edwards. Due to the change in his insurance plan he needed to change cardiologist. He has a history of coronary disease and has had previous stenting of the left anterior descending and left circumflex coronaries in the past. Last year he presented with increased anginal symptoms. He was Edwards to have progressive, severe aortic stenosis. He underwent open heart surgery with aortic valve replacement using a #25 mm Edwards pericardial valve. He also had bypass of the LAD with an IMA graft. His postoperative course was complicated by some atrial fibrillation. He was placed on amiodarone initially but this was later discontinued. He reports that he is doing pretty well. He has gained about 20 pounds. He denies any chest pain or shortness of breath. He is doing only a little bit of walking. He occasionally experiences some dizziness with standing. His daughter reports that Lasix was stopped before but he had significant swelling.  Current Outpatient Prescriptions on File Prior to Visit  Medication Sig Dispense Refill  . aspirin 81 MG EC tablet Take 81 mg by mouth daily.        Marland Kitchen atorvastatin (LIPITOR) 20 MG tablet Take 1 tablet (20 mg total) by mouth daily.  30 tablet  11  . budesonide-formoterol (SYMBICORT) 80-4.5 MCG/ACT inhaler Inhale 2 puffs into the lungs 2 (two) times daily.  1 Inhaler  3  . clopidogrel (PLAVIX) 75 MG tablet Take 1 tablet (75 mg total) by mouth daily.  30 tablet  5  . ergocalciferol (VITAMIN D2) 50000 UNITS capsule Take 1 capsule (50,000 Units total) by mouth once a week. Take for 8 weeks  12 capsule  3  . famotidine (PEPCID) 20 MG tablet Take 20 mg by mouth 2 (two) times daily as needed.       . furosemide (LASIX) 40 MG tablet Take 40 mg by mouth daily.      Marland Kitchen glipiZIDE  (GLUCOTROL) 10 MG tablet Take 10 mg by mouth daily.      . metoprolol tartrate (LOPRESSOR) 25 MG tablet Take 1 tablet (25 mg total) by mouth 2 (two) times daily.  60 tablet  11  . nitroGLYCERIN (NITROSTAT) 0.4 MG SL tablet Place 1 tablet under tongue as directed      . polyethylene glycol (GLYCOLAX) packet dissolve 1 capful (17 grams) in 8 oz of water 1-2 times daily as needed for constipation.  Disp 1 large bottle      . potassium chloride (K-DUR) 10 MEQ tablet Take 2 tablets (20 mEq total) by mouth daily.  60 tablet  11  . vitamin C (ASCORBIC ACID) 500 MG tablet Take 500 mg by mouth daily.         Current Facility-Administered Medications on File Prior to Visit  Medication Dose Route Frequency Provider Last Rate Last Dose  . TDaP (BOOSTRIX) injection 0.5 mL  0.5 mL Intramuscular Once Matthew Jude, MD        Allergies  Allergen Reactions  . Pineapple Concentrate     Past Medical History  Diagnosis Date  . Diabetes mellitus   . Depression   . Hyperlipidemia   . Hypertension   . CAD (coronary artery disease)   . GERD (gastroesophageal reflux disease)     Past Surgical History  Procedure Date  . Coronary artery bypass graft   .  Aortic valve replacement     History  Smoking status  . Former Smoker  . Quit date: 07/08/2004  Smokeless tobacco  . Never Used    History  Alcohol Use No    Family History  Problem Relation Age of Onset  . Heart disease Mother     Review of Systems: The review of systems is positive for diabetes mellitus. He has a history of hypertension and hyperlipidemia. He was unable to participate in cardiac rehabilitation because of cost. All other systems were reviewed and are negative.  Physical Exam: BP 148/80  Pulse 74  Ht 5\' 9"  (1.753 m)  Wt 236 lb 6.4 oz (107.23 kg)  BMI 34.91 kg/m2 He is a pleasant black male in no acute distress.The patient is alert and oriented x 3.  The mood and affect are normal.  The skin is warm and dry.  Color is  normal.  The HEENT exam reveals that the sclera are nonicteric.  The mucous membranes are moist.  The carotids are 2+ without bruits.  There is no thyromegaly.  There is no JVD.  The lungs are clear.  The chest wall is non tender. His median sternotomy scar has healed well. The heart exam reveals a regular rate with a normal S1 and S2.  There is a soft systolic ejection sound at the right upper sternal border.  The PMI is not displaced.   Abdominal exam reveals good bowel sounds.  There is no guarding or rebound.  There is no hepatosplenomegaly or tenderness.  There are no masses.  Exam of the legs reveal no clubbing, cyanosis, or edema.  The legs are without rashes.  The distal pulses are intact.  Cranial nerves II - XII are intact.  Motor and sensory functions are intact.  The gait is normal.  LABORATORY DATA: ECG today demonstrates normal sinus rhythm with a normal ECG.  Assessment / Plan:

## 2011-07-09 NOTE — Assessment & Plan Note (Signed)
He is on appropriate statin therapy. I do not see any recent lipid levels. He has fairly close followup with [name] for his diabetes. I would recommend his chemistries and lipid panel be rechecked on followup with her.Matthew Edwards

## 2011-07-09 NOTE — Assessment & Plan Note (Signed)
He is status post tissue aortic valve replacement in March of 2012. His valve sounds are normal by exam. He reports a followup echocardiogram postoperatively. We will request those results. At this point he is experiencing mild orthostatic symptoms. I recommended reducing his Lasix to 20 mg daily since he has no significant edema on exam.  I have stressed the importance of sodium restriction.

## 2011-07-13 ENCOUNTER — Telehealth: Payer: Self-pay | Admitting: Family Medicine

## 2011-07-13 MED ORDER — SIMVASTATIN 40 MG PO TABS: 40.0000 mg | ORAL_TABLET | Freq: Every day | ORAL | Status: DC

## 2011-07-13 NOTE — Telephone Encounter (Signed)
Patient notified

## 2011-07-13 NOTE — Telephone Encounter (Signed)
Try this one--see if its on the list and cheaper.

## 2011-07-13 NOTE — Telephone Encounter (Signed)
Will forward  this message to [name].

## 2011-07-13 NOTE — Telephone Encounter (Signed)
Pt was taking atorvastatin (LIPITOR) 20 MG tablet  And states that it is too expensive now - wants to know if there is something like that on the $4 plan  [facility] - Renie Ora

## 2011-07-14 ENCOUNTER — Telehealth: Payer: Self-pay | Admitting: Cardiology

## 2011-07-14 NOTE — Telephone Encounter (Signed)
ROI faxed to [facility] @ [phone] 07/14/11/KM

## 2011-07-27 ENCOUNTER — Other Ambulatory Visit: Payer: Self-pay | Admitting: Family Medicine

## 2011-07-27 ENCOUNTER — Encounter: Payer: Self-pay | Admitting: Cardiology

## 2011-07-27 DIAGNOSIS — J449 Chronic obstructive pulmonary disease, unspecified: Secondary | ICD-10-CM

## 2011-07-27 MED ORDER — BUDESONIDE-FORMOTEROL FUMARATE 80-4.5 MCG/ACT IN AERO: 2.0000 | INHALATION_SPRAY | Freq: Two times a day (BID) | RESPIRATORY_TRACT | Status: DC

## 2011-07-27 NOTE — Telephone Encounter (Signed)
Records Received from [facility] gave to [facility]  07/27/11/KM

## 2011-07-27 NOTE — Telephone Encounter (Signed)
ROI refaxed to [facility] @ [phone] 07/27/11/KM

## 2011-07-27 NOTE — Progress Notes (Signed)
Rx for Symicort called to pharmacy as MD requested.

## 2011-08-13 ENCOUNTER — Other Ambulatory Visit: Payer: Self-pay | Admitting: Family Medicine

## 2011-08-13 DIAGNOSIS — J449 Chronic obstructive pulmonary disease, unspecified: Secondary | ICD-10-CM

## 2011-08-13 MED ORDER — BUDESONIDE-FORMOTEROL FUMARATE 80-4.5 MCG/ACT IN AERO: 2.0000 | INHALATION_SPRAY | Freq: Two times a day (BID) | RESPIRATORY_TRACT | Status: DC

## 2011-08-13 NOTE — Progress Notes (Signed)
RX for [employer] called to pharmacy.

## 2011-09-02 ENCOUNTER — Telehealth: Payer: Self-pay | Admitting: *Deleted

## 2011-09-02 NOTE — Telephone Encounter (Signed)
Refill request for furosemide 40 mg received from  [facility] pharmacy. Will forward message to [name].

## 2011-09-03 MED ORDER — FUROSEMIDE 40 MG PO TABS: 40.0000 mg | ORAL_TABLET | Freq: Every day | ORAL | Status: DC

## 2011-09-03 NOTE — Telephone Encounter (Signed)
Received faxed refill request from [facility] for Symbicort.   It is noted that Symbicort was called in 03/14. Looks like may have been sent to [facility].  RN called RX into the [facility] pharmacy.

## 2011-09-18 ENCOUNTER — Telehealth: Payer: Self-pay | Admitting: Family Medicine

## 2011-09-18 NOTE — Telephone Encounter (Addendum)
Returned call to patient.  States his "sugar was up this morning."  Was 160 at 9:30am.  Ate and checked CBG at 10:30am---180.  Just checked CBG at 2:00pm---125, which is within his "normal."   Patient believes he ate some spaghetti and it caused him to feel bad.  Says he feels fine now.  Last office visit was 06/2011.  Informed patient that he needs appt for his 3 month diabetes follow-up.  Patient's daughter brings him to his appts and he will have her call to schedule his appt.  Nolene Ebbs, RN

## 2011-09-18 NOTE — Telephone Encounter (Signed)
Pt states that his sugar is up and feels queezy and stomach prob.  Needs to talk to nurse

## 2011-10-05 ENCOUNTER — Ambulatory Visit (INDEPENDENT_AMBULATORY_CARE_PROVIDER_SITE_OTHER): Payer: Medicare HMO | Admitting: Family Medicine

## 2011-10-05 ENCOUNTER — Encounter: Payer: Self-pay | Admitting: Family Medicine

## 2011-10-05 VITALS — BP 158/78 | HR 84 | Temp 97.9°F | Ht 69.0 in | Wt 240.8 lb

## 2011-10-05 DIAGNOSIS — H04209 Unspecified epiphora, unspecified lacrimal gland: Secondary | ICD-10-CM

## 2011-10-05 NOTE — Patient Instructions (Signed)
Use a lubricant eye drop such as Systane Ultra to help with dry eyes.  You need to see an eye doctor- ophthalmologist.  Please lt me know when I can refer you to one.  Take your blood pressure medicines every day  Follow-up with your primary doctor

## 2011-10-05 NOTE — Progress Notes (Signed)
Subjective:     Patient ID: Matthew Edwards, male   DOB: 31-Mar-1941, 71 y.o.   MRN: [id number]  HPI Watery eyes. Started 1 mo ago. Little pain. Left eye worse than right. Not constant. Feel when wakes up, but quickly alleviated with moving around. Three days ago was worse - eye more runny, slightly more painful. Has noted eye redness with watery eyes, however not constant  Eyes are not itchy. No symptoms at this time. No pain with eye movement.  Has not done anything for pain. Has OTC eye drops - have not helped pain/watery.  No HA, runny nose, sore throat, CP. Otherwise feels well.  Hx of DM. Last eye appt 1 year ago. Fasting blood sugars range 117-155.  Has not taken medications this morning. Ate breakfast.  Review of Systems     Objective:   Physical Exam Gen: well-appearing in nad HEENT: NCAT, EOMI, conjunctiva clear, no papilledema appreciated. CV: RRR, no m/r/g Lungs: CTAB, no increased wob Ext: No c/e    Assessment:         Plan:     Here for eyes watering with discomfort over the past month. No other symptoms. This is likely due to dry eyes. No concern for acute process at this time. He does have hx of DM and is due for ophtho check-up.  Recommended eye exam soon with ophtho.  In the meantime, use eye lubricant, such as Systane Ultra, for dry eyes.  BP: elevated today - did not take medications this AM.

## 2011-10-06 DIAGNOSIS — H04209 Unspecified epiphora, unspecified lacrimal gland: Secondary | ICD-10-CM | POA: Insufficient documentation

## 2011-10-06 NOTE — Progress Notes (Signed)
  Subjective:    Patient ID: Matthew Edwards, male    DOB: 1940/07/13, 71 y.o.   MRN: [id number]  HPI Examined and discussed patient with MS3.  Agree with documentation as noted.  Several weeks of watery eyes.  No crusting in Am. No pain but feels "gritty".  No itching. No change in vision.  No red eyes.  Using an OTC eye drop- unsure of name.  Worse when he is staring at TV.  Review of Systems No fever, ch ills.    Objective:   Physical Exam GEN: NAD Eyes; pupils 2-3 mm bilaterally.  No conjunctival injection.  No photophobia.  EOMI.       Assessment & Plan:

## 2011-10-06 NOTE — Assessment & Plan Note (Signed)
No evidence of foreign body, abrasion, conjunctivitis.  Will treat for dry eyes with lubricant eye drop, advised him to follow-up with his eye doctor for full exam to rule out inflammatory, autoimmune causes.  He declines referral today stating he would like to call and set it up himself.  We discussed importance of close follow-up.

## 2011-10-24 ENCOUNTER — Emergency Department (HOSPITAL_COMMUNITY)
Admission: EM | Admit: 2011-10-24 | Discharge: 2011-10-25 | Disposition: A | Discharge status: Home / Self Care | Payer: Medicare HMO | Attending: Emergency Medicine | Admitting: Emergency Medicine

## 2011-10-24 ENCOUNTER — Encounter (HOSPITAL_COMMUNITY): Payer: Self-pay | Admitting: Emergency Medicine

## 2011-10-24 DIAGNOSIS — E119 Type 2 diabetes mellitus without complications: Secondary | ICD-10-CM | POA: Insufficient documentation

## 2011-10-24 DIAGNOSIS — Z87891 Personal history of nicotine dependence: Secondary | ICD-10-CM | POA: Insufficient documentation

## 2011-10-24 DIAGNOSIS — I2510 Atherosclerotic heart disease of native coronary artery without angina pectoris: Secondary | ICD-10-CM | POA: Insufficient documentation

## 2011-10-24 DIAGNOSIS — Z951 Presence of aortocoronary bypass graft: Secondary | ICD-10-CM | POA: Insufficient documentation

## 2011-10-24 DIAGNOSIS — H6120 Impacted cerumen, unspecified ear: Secondary | ICD-10-CM

## 2011-10-24 DIAGNOSIS — K219 Gastro-esophageal reflux disease without esophagitis: Secondary | ICD-10-CM | POA: Insufficient documentation

## 2011-10-24 DIAGNOSIS — E669 Obesity, unspecified: Secondary | ICD-10-CM | POA: Insufficient documentation

## 2011-10-24 DIAGNOSIS — Z79899 Other long term (current) drug therapy: Secondary | ICD-10-CM | POA: Insufficient documentation

## 2011-10-24 DIAGNOSIS — E785 Hyperlipidemia, unspecified: Secondary | ICD-10-CM | POA: Insufficient documentation

## 2011-10-24 DIAGNOSIS — Z7982 Long term (current) use of aspirin: Secondary | ICD-10-CM | POA: Insufficient documentation

## 2011-10-24 DIAGNOSIS — I10 Essential (primary) hypertension: Secondary | ICD-10-CM | POA: Insufficient documentation

## 2011-10-24 NOTE — ED Notes (Addendum)
Tried to clean L ear out with toilet paper 3 days ago, thinks paper stuck- now reports sore, and decreased hearing; noted build-up of ear wax in ear

## 2011-10-24 NOTE — ED Notes (Addendum)
Patient complaining of left ear pain; patient states that he tried to clean the earwax out of his ear with twisted up toilet paper three days ago.  Patient feels that paper might be stuck in his ear.  Patient states that he has a hard time hearing out of his left ear.  Patient denies nausea, vomiting, and dizziness.  Upon assessment, earwax noted in ear.  Ear irrigated, per PA request.  Patient alert and oriented x4; PERRL present.  Will continue to monitor.

## 2011-10-25 NOTE — ED Notes (Signed)
Patient currently resting quietly in bed; no respiratory or acute distress noted.  Patient updated on plan of care; informed patient that we are currently waiting on discharge paperwork from [place]. Patient has no other questions or concerns at this time; will continue to monitor.

## 2011-10-25 NOTE — Discharge Instructions (Signed)
You were seen and treated for your complaints of ear pain and hearing loss. You're found to have blockage of ear wax in your ear. This was rinsed and cleared with peroxide and water. At this time your symptoms have improved in your providers feel you may return home and continue to use warm water and peroxide rinses in your ear to help loosen the earwax. Please followup with your primary care provider for your general health needs.   Cerumen Impaction A cerumen impaction is when the wax in your ear forms a plug. This plug usually causes reduced hearing. Sometimes it also causes an earache or dizziness. Removing a cerumen impaction can be difficult and painful. The wax sticks to the ear canal. The canal is sensitive and bleeds easily. If you try to remove a heavy wax buildup with a cotton tipped swab, you may push it in further. Irrigation with water, suction, and small ear curettes may be used to clear out the wax. If the impaction is fixed to the skin in the ear canal, ear drops may be needed for a few days to loosen the wax. People who build up a lot of wax frequently can use ear wax removal products available in your local drugstore. SEEK MEDICAL CARE IF:  You develop an earache, increased hearing loss, or marked dizziness. Document Released: 06/25/2004 Document Revised: 05/07/2011 Document Reviewed: 08/15/2009 [facility] Patient Information 2012 [place].   RESOURCE GUIDE  Dental Problems  Patients with Medicaid: [place] Phone:  [phone]                                                  Phone:  [phone]  If unable to pay or uninsured, contact:  [facility] or [facility] to become qualified for the adult dental clinic.  Chronic Pain Problems Contact [facility]  [phone] Patients need to be referred by their  primary care doctor.  Insufficient Money for Medicine Contact [employer]:  call "211" or [place] [phone].  No Primary Care Doctor Call [facility]  [phone] Other agencies that provide inexpensive medical care    [place]  [phone]    [facility]  [place]  [phone]    [facility]  [phone]    [facility]  [place]  [place]  [phone] [place]   [phone] (emergency services 2294081897)  [facility]  [phone] [facility] 5391521634 [facility] [place] [phone] [facility] [place] [phone] Residential & Outpatient Substance Abuse Program  [phone]  Abuse/Neglect [place] [phone] [place] 2010952597 (After Hours)  Emergency [place] [phone]  [place] [facility] the [place] [phone])  [place] [phone]  MRSA Hotline #:   334-744-4180    [facility] Dept. [address]      [place] Phone:  [phone]                                   Phone:  [phone]                 Phone:  [place] Phone:  [place] [phone] (After Hours)

## 2011-10-25 NOTE — ED Provider Notes (Signed)
History     CSN: [id number]  Arrival date & time 10/24/11  2317   First MD Initiated Contact with Patient 10/24/11 2327      Chief Complaint  Patient presents with  . Cerumen Impaction    HPI  History provided by the patient. Patient is a 71 year old male with history of hypertension, hyperlipidemia, diabetes and coronary artery disease presents with complaints of left ear pain and hearing loss. Patient reports his symptoms began approximately 4 days ago and have been persistent. Patient reports having similar symptoms in the past caused from ear wax buildup. Patient states his daughter did try to help reduce his ear but has no improvements. Patient has not tried anything else for symptoms. He denies any other aggravating or alleviating factors. He denies any dizziness, vertigo, nausea or vomiting. Patient denies any chest pain, shortness of breath, heart palpitations. he denies any fever, chills, sweats.    Past Medical History  Diagnosis Date  . Diabetes mellitus   . Depression   . Hyperlipidemia   . Hypertension   . CAD (coronary artery disease)   . GERD (gastroesophageal reflux disease)     Past Surgical History  Procedure Date  . Coronary artery bypass graft   . Aortic valve replacement   . Coronary angioplasty with stent placement     Family History  Problem Relation Age of Onset  . Heart disease Mother     History  Substance Use Topics  . Smoking status: Former Smoker    Quit date: 07/08/2004  . Smokeless tobacco: Never Used  . Alcohol Use: No      Review of Systems  Constitutional: Negative for fever and chills.  HENT: Positive for hearing loss and ear pain. Negative for congestion and ear discharge.   Respiratory: Negative for shortness of breath.   Cardiovascular: Negative for chest pain.  Gastrointestinal: Negative for nausea and vomiting.    Allergies  Pineapple concentrate  Home Medications   Current Outpatient Rx  Name Route Sig Dispense  Refill  . ASPIRIN 81 MG PO TBEC Oral Take 81 mg by mouth daily.      Marland Kitchen BENAZEPRIL HCL 10 MG PO TABS Oral Take 1 tablet (10 mg total) by mouth daily. 30 tablet 11  . BUDESONIDE-FORMOTEROL FUMARATE 80-4.5 MCG/ACT IN AERO Inhalation Inhale 2 puffs into the lungs 2 (two) times daily. 1 Inhaler 3  . CLOPIDOGREL BISULFATE 75 MG PO TABS Oral Take 1 tablet (75 mg total) by mouth daily. 30 tablet 5  . DOCUSATE SODIUM 100 MG PO CAPS Oral Take 200 mg by mouth daily as needed. For constipation    . ERGOCALCIFEROL 50000 UNITS PO CAPS Oral Take 1 capsule (50,000 Units total) by mouth once a week. Take for 8 weeks 12 capsule 3  . FAMOTIDINE 20 MG PO TABS Oral Take 20 mg by mouth 2 (two) times daily as needed. For acid reflux    . FUROSEMIDE 40 MG PO TABS Oral Take 1 tablet (40 mg total) by mouth daily. 30 tablet 5  . GLIPIZIDE 10 MG PO TABS Oral Take 10 mg by mouth daily.    Marland Kitchen METOPROLOL TARTRATE 25 MG PO TABS Oral Take 1 tablet (25 mg total) by mouth 2 (two) times daily. 60 tablet 11  . NITROGLYCERIN 0.4 MG SL SUBL Sublingual Place 0.4 mg under the tongue every 5 (five) minutes as needed. For chest pain    . POLYETHYLENE GLYCOL 3350 PO PACK  dissolve 1 capful (17 grams) in  8 oz of water 1-2 times daily as needed for constipation.  Disp 1 large bottle    . POTASSIUM CHLORIDE ER 10 MEQ PO TBCR Oral Take 2 tablets (20 mEq total) by mouth daily. 60 tablet 11  . SIMVASTATIN 40 MG PO TABS Oral Take 1 tablet (40 mg total) by mouth at bedtime. 90 tablet 3  . VITAMIN C 500 MG PO TABS Oral Take 500 mg by mouth daily.        BP 147/77  Pulse 73  Temp(Src) 98.5 F (36.9 C) (Oral)  Resp 22  SpO2 94%  Physical Exam  Nursing note and vitals reviewed. Constitutional: He is oriented to person, place, and time. He appears well-developed and well-nourished. No distress.  HENT:  Head: Normocephalic.  Right Ear: External ear normal.       Patient with left cerumen impaction. Right ear canal clear with only small amount  of wax TM appears normal.  Neck: Normal range of motion. Neck supple.  Cardiovascular: Normal rate and regular rhythm.   Pulmonary/Chest: Effort normal and breath sounds normal.  Abdominal: Soft. There is no tenderness.       Obese  Neurological: He is alert and oriented to person, place, and time.  Skin: Skin is warm.  Psychiatric: He has a normal mood and affect. His behavior is normal.    ED Course  Procedures      1. Cerumen impaction       MDM  Patient seen and evaluated. Patient no acute distress.   Patient's ear irrigated and soaked with water proximal thigh pressure by nurse. This was performed several times. Patient now reports improvement of pain in the hearing symptoms.  Have given patient instructions continue warm water and peroxide rinses and near the next 2 days to continue loosing ear wax. he is ready to return home.     Martie Lee, [place] 10/25/11 [phone]

## 2011-10-25 NOTE — ED Notes (Signed)
Patient given discharge paperwork; went over discharge instructions with patient.  Patient instructed to continue warm water/peroxide rinses to help break up earwax at home.  Patient instructed to follow up with primary care physician and to return to the ED for new, worsening, or concerning symptoms.

## 2011-10-27 NOTE — ED Provider Notes (Signed)
Medical screening examination/treatment/procedure(s) were performed by non-physician practitioner and as supervising physician I was immediately available for consultation/collaboration.    Johnna Acosta, MD 10/27/11 [phone]

## 2011-11-25 ENCOUNTER — Encounter: Payer: Self-pay | Admitting: Family Medicine

## 2011-11-25 ENCOUNTER — Ambulatory Visit (INDEPENDENT_AMBULATORY_CARE_PROVIDER_SITE_OTHER): Payer: Medicare HMO | Admitting: Family Medicine

## 2011-11-25 VITALS — BP 118/63 | HR 80 | Temp 98.3°F | Ht 69.0 in | Wt 237.0 lb

## 2011-11-25 DIAGNOSIS — E1149 Type 2 diabetes mellitus with other diabetic neurological complication: Secondary | ICD-10-CM

## 2011-11-25 DIAGNOSIS — E119 Type 2 diabetes mellitus without complications: Secondary | ICD-10-CM

## 2011-11-25 DIAGNOSIS — E1142 Type 2 diabetes mellitus with diabetic polyneuropathy: Secondary | ICD-10-CM

## 2011-11-25 DIAGNOSIS — I10 Essential (primary) hypertension: Secondary | ICD-10-CM

## 2011-11-25 DIAGNOSIS — E1140 Type 2 diabetes mellitus with diabetic neuropathy, unspecified: Secondary | ICD-10-CM | POA: Insufficient documentation

## 2011-11-25 LAB — POCT GLYCOSYLATED HEMOGLOBIN (HGB A1C): Hemoglobin A1C: 6.1

## 2011-11-25 NOTE — Patient Instructions (Signed)
Diabetes and Foot Care Diabetes may cause you to have a poor blood supply (circulation) to your legs and feet. Because of this, the skin may be thinner, break easier, and heal more slowly. You also may have nerve damage in your legs and feet causing decreased feeling. You may not notice minor injuries to your feet that could lead to serious problems or infections. Taking care of your feet is one of the most important things you can do for yourself.  HOME CARE INSTRUCTIONS  Do not go barefoot. Bare feet are easily injured.   Check your feet daily for blisters, cuts, and redness.   Wash your feet with warm water (not hot) and mild soap. Pat your feet and between your toes until completely dry.   Apply a moisturizing lotion that does not contain alcohol or petroleum jelly to the dry skin on your feet and to dry brittle toenails. Do not put it between your toes.   Trim your toenails straight across. Do not dig under them or around the cuticle.   Do not cut corns or calluses, or try to remove them with medicine.   Wear clean cotton socks or stockings every day. Make sure they are not too tight. Do not wear knee high stockings since they may decrease blood flow to your legs.   Wear leather shoes that fit properly and have enough cushioning. To break in new shoes, wear them just a few hours a day to avoid injuring your feet.   Wear shoes at all times, even in the house.   Do not cross your legs. This may decrease the blood flow to your feet.   If you find a minor scrape, cut, or break in the skin on your feet, keep it and the skin around it clean and dry. These areas may be cleansed with mild soap and water. Do not use peroxide, alcohol, iodine or Merthiolate.   When you remove an adhesive bandage, be sure not to harm the skin around it.   If you have a wound, look at it several times a day to make sure it is healing.   Do not use heating pads or hot water bottles. Burns can occur. If you have  lost feeling in your feet or legs, you may not know it is happening until it is too late.   Report any cuts, sores or bruises to your caregiver. Do not wait!  SEEK MEDICAL CARE IF:   You have an injury that is not healing or you notice redness, numbness, burning, or tingling.   Your feet always feel cold.   You have pain or cramps in your legs and feet.  SEEK IMMEDIATE MEDICAL CARE IF:   There is increasing redness, swelling, or increasing pain in the wound.   There is a red line that goes up your leg.   Pus is coming from a wound.   You develop an unexplained oral temperature above 102 F (38.9 C), or as your caregiver suggests.   You notice a bad smell coming from an ulcer or wound.  MAKE SURE YOU:   Understand these instructions.   Will watch your condition.   Will get help right away if you are not doing well or get worse.  Document Released: 05/15/2000 Document Revised: 05/07/2011 Document Reviewed: 11/21/2008 [facility] Patient Information 2012 [place].Exercise to Lose Weight Exercise and a healthy diet may help you lose weight. Your doctor may suggest specific exercises. EXERCISE IDEAS AND TIPS  Choose  low-cost things you enjoy doing, such as walking, bicycling, or exercising to workout videos.   Take stairs instead of the elevator.   Walk during your lunch break.   Park your car further away from work or school.   Go to a gym or an exercise class.   Start with 5 to 10 minutes of exercise each day. Build up to 30 minutes of exercise 4 to 6 days a week.   Wear shoes with good support and comfortable clothes.   Stretch before and after working out.   Work out until you breathe harder and your heart beats faster.   Drink extra water when you exercise.   Do not do so much that you hurt yourself, feel dizzy, or get very short of breath.  Exercises that burn about 150 calories:  Running 1  miles in 15 minutes.   Playing volleyball for 45 to 60  minutes.   Washing and waxing a car for 45 to 60 minutes.   Playing touch football for 45 minutes.   Walking 1  miles in 35 minutes.   Pushing a stroller 1  miles in 30 minutes.   Playing basketball for 30 minutes.   Raking leaves for 30 minutes.   Bicycling 5 miles in 30 minutes.   Walking 2 miles in 30 minutes.   Dancing for 30 minutes.   Shoveling snow for 15 minutes.   Swimming laps for 20 minutes.   Walking up stairs for 15 minutes.   Bicycling 4 miles in 15 minutes.   Gardening for 30 to 45 minutes.   Jumping rope for 15 minutes.   Washing windows or floors for 45 to 60 minutes.  Document Released: 06/20/2010 Document Revised: 05/07/2011 Document Reviewed: 06/20/2010 [facility] Patient Information 2012 [place].

## 2011-11-25 NOTE — Assessment & Plan Note (Signed)
Doing well---BP in excellent control.

## 2011-11-25 NOTE — Assessment & Plan Note (Addendum)
Foot exam today, eye exam, urine micro-albumin, to see podiatry and cards who can do lipid panel when he is fasting.

## 2011-11-25 NOTE — Progress Notes (Signed)
  Subjective:    Patient ID: Matthew Edwards, male    DOB: May 12, 1941, 71 y.o.   MRN: [id number]  HPI Comes in today for f/u.  Brings in fasting BS that are between 110-145.  Hgb A1C is 6.1 today.  Reports burning in his feet.  Also, discussed zovirax.  He declines at this time.  Knows he needs to see eye MD.  Has seen podiatrist.   Review of Systems  HENT: Negative for congestion, rhinorrhea and neck pain.   Respiratory: Negative for cough, shortness of breath and wheezing.   Cardiovascular: Negative for chest pain and leg swelling.  Gastrointestinal: Positive for constipation. Negative for nausea and vomiting.  Genitourinary: Negative for hematuria.  Musculoskeletal: Negative for joint swelling.       Objective:   Physical Exam  Vitals reviewed. Constitutional: He is oriented to person, place, and time. He appears well-developed and well-nourished.  HENT:  Head: Normocephalic and atraumatic.  Eyes: No scleral icterus.  Cardiovascular: Normal rate.   Pulmonary/Chest: Effort normal and breath sounds normal.  Abdominal: Soft. There is no tenderness. There is no rebound.  Musculoskeletal: Normal range of motion. He exhibits no edema.       Some loss of sensation to light touch on the toes bilaterally.  Feet are warm and pulses are present bilaterally at the DP.  Neurological: He is alert and oriented to person, place, and time.  Skin: Skin is warm. No rash noted.          Assessment & Plan:

## 2011-11-26 LAB — MICROALBUMIN / CREATININE URINE RATIO
Creatinine, Urine: 133.7 mg/dL
Microalb Creat Ratio: 3.7 mg/g (ref 0.0–30.0)
Microalb, Ur: 0.5 mg/dL (ref 0.00–1.89)

## 2011-11-27 ENCOUNTER — Ambulatory Visit (INDEPENDENT_AMBULATORY_CARE_PROVIDER_SITE_OTHER): Payer: Medicare HMO | Admitting: Family Medicine

## 2011-11-27 ENCOUNTER — Encounter: Payer: Self-pay | Admitting: Family Medicine

## 2011-11-27 ENCOUNTER — Telehealth: Payer: Self-pay | Admitting: Family Medicine

## 2011-11-27 VITALS — BP 154/76 | HR 72 | Temp 97.4°F | Wt 239.0 lb

## 2011-11-27 DIAGNOSIS — E119 Type 2 diabetes mellitus without complications: Secondary | ICD-10-CM

## 2011-11-27 LAB — COMPREHENSIVE METABOLIC PANEL
ALT: 17 U/L (ref 0–53)
AST: 17 U/L (ref 0–37)
Albumin: 4.6 g/dL (ref 3.5–5.2)
Alkaline Phosphatase: 69 U/L (ref 39–117)
BUN: 14 mg/dL (ref 6–23)
CO2: 25 mEq/L (ref 19–32)
Calcium: 10.4 mg/dL (ref 8.4–10.5)
Chloride: 107 mEq/L (ref 96–112)
Creat: 1.1 mg/dL (ref 0.50–1.35)
Glucose, Bld: 83 mg/dL (ref 70–99)
Potassium: 4.5 mEq/L (ref 3.5–5.3)
Sodium: 140 mEq/L (ref 135–145)
Total Bilirubin: 0.5 mg/dL (ref 0.3–1.2)
Total Protein: 6.9 g/dL (ref 6.0–8.3)

## 2011-11-27 LAB — CBC
HCT: 47 % (ref 39.0–52.0)
Hemoglobin: 15.9 g/dL (ref 13.0–17.0)
MCH: 29.2 pg (ref 26.0–34.0)
MCHC: 33.8 g/dL (ref 30.0–36.0)
MCV: 86.4 fL (ref 78.0–100.0)
Platelets: 139 10*3/uL — ABNORMAL LOW (ref 150–400)
RBC: 5.44 MIL/uL (ref 4.22–5.81)
RDW: 13.8 % (ref 11.5–15.5)
WBC: 5.2 10*3/uL (ref 4.0–10.5)

## 2011-11-27 LAB — GLUCOSE, CAPILLARY: Glucose-Capillary: 104 mg/dL — ABNORMAL HIGH (ref 70–99)

## 2011-11-27 NOTE — Telephone Encounter (Signed)
BS was 70 this AM and went down to 66. Pt had something to eat and it went up to 131. He's starting to get weak again. Gave appt for this AM with Dr. Ernestina Patches.Audelia Hives [place]

## 2011-11-27 NOTE — Patient Instructions (Addendum)
You're blood sugar looks good right now.   I'm not sure what caused it to go so low this AM. Go ahead and take your blood pressure medicine today. Hold off on the diabetes medication for today. You can restart this tomorrow.  If you have trouble dropping low again, we may need to cut back to 1/2 a pill.   Continue keeping a record of your fasting sugars and see [place] next week. If this happens again, don't wait until next week.

## 2011-11-28 NOTE — Assessment & Plan Note (Signed)
Relative hypoglycemia for this patient with AM CBG 60-70 range when normally in 120's.   Treated appropriately.   May need decrease in Glipizide in future if this recurs. Hold off on Glipizide today, may resume tomorrow at whole or half dose, discussed parameters with patient and daughter.   FU next week to review AM CBGs, sooner if recurs.

## 2011-11-28 NOTE — Progress Notes (Signed)
  Subjective:    Patient ID: Matthew Edwards, male    DOB: 1940-12-27, 71 y.o.   MRN: [id number]  HPI  1.  Concern for hypoglycemia:  Patient awoke this morning with subjective feelings of weakness.  Checked CBG, was 76.  Checked again 15 minutes later and it was 68.  Called daughter.  Had eggs, bacon, toast.  CBG came up to 130 about an hour later.  Weakness resolved.  CBG dropped to 111 and daughter became worried that it would continue to drop and brought him to clinic.  Did not take glipizide today.  Took yesterday AM as he always does.  Ate 3 meals as he ususally does yesterday.  Review of fasting CBGs shows range from 120 - 140 in AM.  Currently he feels better, though still a little weaker than usual.  No fevers/chills/N/V/abd pain.  Review of Systems See HPI above for review of systems.       Objective:   Physical Exam Gen:  Alert, cooperative patient who appears stated age in no acute distress.  Vital signs reviewed. Cardiac:  Regular rate and rhythm without murmur auscultated.  Good S1/S2. Pulm:  Clear to auscultation bilaterally with good air movement.  No wheezes or rales noted.   Abdomen:  Obese/NT       Assessment & Plan:

## 2011-12-01 ENCOUNTER — Other Ambulatory Visit: Payer: Self-pay | Admitting: *Deleted

## 2011-12-01 DIAGNOSIS — J449 Chronic obstructive pulmonary disease, unspecified: Secondary | ICD-10-CM

## 2011-12-01 MED ORDER — BUDESONIDE-FORMOTEROL FUMARATE 80-4.5 MCG/ACT IN AERO: 2.0000 | INHALATION_SPRAY | Freq: Two times a day (BID) | RESPIRATORY_TRACT | Status: DC

## 2011-12-01 NOTE — Telephone Encounter (Signed)
RX called to [facility].

## 2011-12-01 NOTE — Telephone Encounter (Signed)
Will forward to [name].  Please route back to me and I will call in.

## 2011-12-31 ENCOUNTER — Other Ambulatory Visit: Payer: Self-pay | Admitting: *Deleted

## 2012-01-01 MED ORDER — NITROGLYCERIN 0.4 MG SL SUBL: 0.4000 mg | SUBLINGUAL_TABLET | SUBLINGUAL | Status: DC | PRN

## 2012-01-25 ENCOUNTER — Encounter: Payer: Self-pay | Admitting: Home Health Services

## 2012-02-04 ENCOUNTER — Other Ambulatory Visit: Payer: Self-pay | Admitting: Family Medicine

## 2012-02-04 DIAGNOSIS — I2510 Atherosclerotic heart disease of native coronary artery without angina pectoris: Secondary | ICD-10-CM

## 2012-02-04 MED ORDER — CLOPIDOGREL BISULFATE 75 MG PO TABS: 75.0000 mg | ORAL_TABLET | Freq: Every day | ORAL | Status: DC

## 2012-02-04 NOTE — Telephone Encounter (Signed)
Patient informed that rx was called in, expressed understanding.

## 2012-02-04 NOTE — Telephone Encounter (Signed)
Daughter is calling because [facility] on [address], has sent a refill request for Plavix, twice, but has not heard back anything.

## 2012-02-18 ENCOUNTER — Encounter: Payer: Self-pay | Admitting: Cardiology

## 2012-02-18 ENCOUNTER — Ambulatory Visit (INDEPENDENT_AMBULATORY_CARE_PROVIDER_SITE_OTHER): Payer: Medicare HMO | Admitting: Cardiology

## 2012-02-18 VITALS — BP 148/76 | HR 88 | Ht 69.0 in | Wt 243.0 lb

## 2012-02-18 DIAGNOSIS — E119 Type 2 diabetes mellitus without complications: Secondary | ICD-10-CM

## 2012-02-18 DIAGNOSIS — I359 Nonrheumatic aortic valve disorder, unspecified: Secondary | ICD-10-CM

## 2012-02-18 DIAGNOSIS — I10 Essential (primary) hypertension: Secondary | ICD-10-CM

## 2012-02-18 DIAGNOSIS — I2510 Atherosclerotic heart disease of native coronary artery without angina pectoris: Secondary | ICD-10-CM

## 2012-02-18 DIAGNOSIS — E785 Hyperlipidemia, unspecified: Secondary | ICD-10-CM

## 2012-02-18 MED ORDER — BENAZEPRIL HCL 20 MG PO TABS: 20.0000 mg | ORAL_TABLET | Freq: Every day | ORAL | Status: DC

## 2012-02-18 NOTE — Patient Instructions (Signed)
Increase benazepril to 20 mg daily for blood pressure.  You need to increase your exercise  You need to restrict your salt (sodium) intake.  We will schedule you for a fasting lipid levels.  I will see you in 6 months.  2 Gram Low Sodium Diet A 2 gram sodium diet restricts the amount of sodium in the diet to no more than 2 g or 2000 mg daily. Limiting the amount of sodium is often used to help lower blood pressure. It is important if you have heart, liver, or kidney problems. Many foods contain sodium for flavor and sometimes as a preservative. When the amount of sodium in a diet needs to be low, it is important to know what to look for when choosing foods and drinks. The following includes some information and guidelines to help make it easier for you to adapt to a low sodium diet. QUICK TIPS  Do not add salt to food.   Avoid convenience items and fast food.   Choose unsalted snack foods.   Buy lower sodium products, often labeled as "lower sodium" or "no salt added."   Check food labels to learn how much sodium is in 1 serving.   When eating at a restaurant, ask that your food be prepared with less salt or none, if possible.  READING FOOD LABELS FOR SODIUM INFORMATION The nutrition facts label is a good place to find how much sodium is in foods. Look for products with no more than 500 to 600 mg of sodium per meal and no more than 150 mg per serving. Remember that 2 g = 2000 mg. The food label may also list foods as:  Sodium-free: Less than 5 mg in a serving.   Very low sodium: 35 mg or less in a serving.   Low-sodium: 140 mg or less in a serving.   Light in sodium: 50% less sodium in a serving. For example, if a food that usually has 300 mg of sodium is changed to become light in sodium, it will have 150 mg of sodium.   Reduced sodium: 25% less sodium in a serving. For example, if a food that usually has 400 mg of sodium is changed to reduced sodium, it will have 300 mg of  sodium.  CHOOSING FOODS Grains  Avoid: Salted crackers and snack items. Some cereals, including instant hot cereals. Bread stuffing and biscuit mixes. Seasoned rice or pasta mixes.   Choose: Unsalted snack items. Low-sodium cereals, oats, puffed wheat and rice, shredded wheat. English muffins and bread. Pasta.  Meats  Avoid: Salted, canned, smoked, spiced, pickled meats, including fish and poultry. Bacon, ham, sausage, cold cuts, hot dogs, anchovies.   Choose: Low-sodium canned tuna and salmon. Fresh or frozen meat, poultry, and fish.  Dairy  Avoid: Processed cheese and spreads. Cottage cheese. Buttermilk and condensed milk. Regular cheese.   Choose: Milk. Low-sodium cottage cheese. Yogurt. Sour cream. Low-sodium cheese.  Fruits and Vegetables  Avoid: Regular canned vegetables. Regular canned tomato sauce and paste. Frozen vegetables in sauces. Olives. Angie Fava. Relishes. Sauerkraut.   Choose: Low-sodium canned vegetables. Low-sodium tomato sauce and paste. Frozen or fresh vegetables. Fresh and frozen fruit.  Condiments  Avoid: Canned and packaged gravies. Worcestershire sauce. Tartar sauce. Barbecue sauce. Soy sauce. Steak sauce. Ketchup. Onion, garlic, and table salt. Meat flavorings and tenderizers.   Choose: Fresh and dried herbs and spices. Low-sodium varieties of mustard and ketchup. Lemon juice. Tabasco sauce. Horseradish.  SAMPLE 2 GRAM SODIUM MEAL PLAN Breakfast /  Sodium (mg)  1 cup low-fat milk / [id number] mg   2 slices whole-wheat toast / 270 mg   1 tbs heart-healthy margarine / 153 mg   1 hard-boiled egg / 139 mg   1 small orange / 0 mg  Lunch / Sodium (mg)  1 cup raw carrots / 76 mg    cup hummus / 298 mg   1 cup low-fat milk / 143 mg    cup red grapes / 2 mg   1 whole-wheat pita bread / 356 mg  Dinner / Sodium (mg)  1 cup whole-wheat pasta / 2 mg   1 cup low-sodium tomato sauce / 73 mg   3 oz lean ground beef / 57 mg   1 small side salad (1 cup raw  spinach leaves,  cup cucumber,  cup yellow bell pepper) with 1 tsp olive oil and 1 tsp red wine vinegar / 25 mg  Snack / Sodium (mg)  1 container low-fat vanilla yogurt / 107 mg   3 graham cracker squares / 127 mg  Nutrient Analysis  Calories: 2033   Protein: 77 g   Carbohydrate: 282 g   Fat: 72 g   Sodium: 1971 mg  Document Released: 05/18/2005 Document Revised: 05/07/2011 Document Reviewed: 08/19/2009 [facility] Patient Information 2012 [place], [place].

## 2012-02-18 NOTE — Progress Notes (Signed)
Matthew Edwards Date of Birth: 1940/12/16 Medical Record [id number]  History of Present Illness: Matthew Edwards is seen for followup today. He has a history of coronary disease with prior stenting of the LAD and left circumflex coronary disease in the past. In 2012 he underwent open heart surgery with an LIMA graft to the LAD and aortic valve replacement with a #25 mm Edwards pericardial valve. He has a history of diabetes, hypertension, and hyperlipidemia. On followup today he reports that he is feeling well. He has no significant chest pain or shortness of breath. We had previously reduced his furosemide to 20 mg per day and he denies any increased edema. He admits that he is not exercising much. He has lost 7 pounds. He also admits that he eats a lot of sodium in his diet.  Current Outpatient Prescriptions on File Prior to Visit  Medication Sig Dispense Refill  . aspirin 81 MG EC tablet Take 81 mg by mouth daily.        . budesonide-formoterol (SYMBICORT) 80-4.5 MCG/ACT inhaler Inhale 2 puffs into the lungs 2 (two) times daily.  1 Inhaler  3  . clopidogrel (PLAVIX) 75 MG tablet Take 1 tablet (75 mg total) by mouth daily.  30 tablet  5  . docusate sodium (COLACE) 100 MG capsule Take 200 mg by mouth daily as needed. For constipation      . ergocalciferol (VITAMIN D2) 50000 UNITS capsule Take 1 capsule (50,000 Units total) by mouth once a week. Take for 8 weeks  12 capsule  3  . famotidine (PEPCID) 20 MG tablet Take 20 mg by mouth 2 (two) times daily as needed. For acid reflux      . furosemide (LASIX) 40 MG tablet Take 1 tablet (40 mg total) by mouth daily.  30 tablet  5  . glipiZIDE (GLUCOTROL) 10 MG tablet Take 10 mg by mouth daily.      . metoprolol tartrate (LOPRESSOR) 25 MG tablet Take 1 tablet (25 mg total) by mouth 2 (two) times daily.  60 tablet  11  . nitroGLYCERIN (NITROSTAT) 0.4 MG SL tablet Place 1 tablet (0.4 mg total) under the tongue every 5 (five) minutes as needed. For chest pain  30  tablet  3  . polyethylene glycol (GLYCOLAX) packet dissolve 1 capful (17 grams) in 8 oz of water 1-2 times daily as needed for constipation.  Disp 1 large bottle      . simvastatin (ZOCOR) 40 MG tablet Take 1 tablet (40 mg total) by mouth at bedtime.  90 tablet  3  . vitamin C (ASCORBIC ACID) 500 MG tablet Take 500 mg by mouth daily.        Marland Kitchen DISCONTD: benazepril (LOTENSIN) 10 MG tablet Take 1 tablet (10 mg total) by mouth daily.  30 tablet  11  . DISCONTD: potassium chloride (K-DUR) 10 MEQ tablet Take 2 tablets (20 mEq total) by mouth daily.  60 tablet  11    Allergies  Allergen Reactions  . Pineapple Concentrate Nausea And Vomiting    Past Medical History  Diagnosis Date  . Diabetes mellitus   . Depression   . Hyperlipidemia   . Hypertension   . CAD (coronary artery disease) 2012    [place]-LAD  . GERD (gastroesophageal reflux disease)   . S/P AVR     #25 mm Edwards pericardial valve    Past Surgical History  Procedure Date  . Coronary artery bypass graft   . Aortic valve replacement   .  Coronary angioplasty with stent placement     History  Smoking status  . Former Smoker  . Quit date: 07/08/2004  Smokeless tobacco  . Never Used    History  Alcohol Use No    Family History  Problem Relation Age of Onset  . Heart disease Mother     Review of Systems: As noted in history of present illness All other systems were reviewed and are negative.  Physical Exam: BP 148/76  Pulse 88  Ht 5\' 9"  (1.753 m)  Wt 243 lb (110.224 kg)  BMI 35.88 kg/m2 He is a pleasant black male in no acute distress.The patient is alert and oriented x 3.  The mood and affect are normal.  The skin is warm and dry.  Color is normal.  The HEENT exam is normal. The carotids are 2+ without bruits.  There is no thyromegaly.  There is no JVD.  The lungs are clear.  The chest wall is non tender. His median sternotomy scar has healed well. The heart exam reveals a regular rate with a normal S1 and S2.   There is a soft systolic ejection sound at the right upper sternal border.  The PMI is not displaced.   Abdominal exam reveals good bowel sounds.  There is no guarding or rebound.  There is no hepatosplenomegaly or tenderness.  There are no masses.  Exam of the legs reveal no clubbing, cyanosis, or edema.  The legs are without rashes.  The distal pulses are intact.  Cranial nerves II - XII are intact.  Motor and sensory functions are intact.  The gait is normal.  LABORATORY DATA: I reviewed his lab work from June of 2013. His A1c was 6.1%. Chemistries and CBC were normal. Lipid panel was not done.  Assessment / Plan: 1. Coronary disease status post prior stenting of the LAD and left circumflex. Status post CABG in 2012 with an LIMA graft to the LAD. He is on chronic aspirin and Plavix. Continue statin therapy. Recommend increasing his aerobic activity. I'll followup again in 6 months.  2. Hypertension, not at goal. Recommend increasing benazepril to 20 mg per day. I recommended sodium restriction.  3. Aortic stenosis status post aortic valve replacement with a pericardial valve. Postoperative echocardiogram in May of 2012 showed normal valve function.  4. Hyperlipidemia. He is on Lipitor. We will have him return for a fasting lipid panel.  5. Diabetes mellitus type 2. Patient has lost 7 pounds. I encouraged continued weight loss.

## 2012-02-19 ENCOUNTER — Other Ambulatory Visit (INDEPENDENT_AMBULATORY_CARE_PROVIDER_SITE_OTHER): Payer: Medicare HMO

## 2012-02-19 DIAGNOSIS — I2510 Atherosclerotic heart disease of native coronary artery without angina pectoris: Secondary | ICD-10-CM

## 2012-02-19 DIAGNOSIS — E119 Type 2 diabetes mellitus without complications: Secondary | ICD-10-CM

## 2012-02-19 DIAGNOSIS — E785 Hyperlipidemia, unspecified: Secondary | ICD-10-CM

## 2012-02-19 DIAGNOSIS — I359 Nonrheumatic aortic valve disorder, unspecified: Secondary | ICD-10-CM

## 2012-02-19 DIAGNOSIS — I10 Essential (primary) hypertension: Secondary | ICD-10-CM

## 2012-02-19 LAB — LIPID PANEL
Cholesterol: 106 mg/dL (ref 0–200)
HDL: 36.9 mg/dL — ABNORMAL LOW (ref >39.00)
LDL Cholesterol: 60 mg/dL (ref 0–99)
Total CHOL/HDL Ratio: 3
Triglycerides: 47 mg/dL (ref 0.0–149.0)
VLDL: 9.4 mg/dL (ref 0.0–40.0)

## 2012-02-29 ENCOUNTER — Other Ambulatory Visit: Payer: Self-pay | Admitting: *Deleted

## 2012-02-29 MED ORDER — FUROSEMIDE 40 MG PO TABS: 40.0000 mg | ORAL_TABLET | Freq: Every day | ORAL | Status: DC

## 2012-06-08 ENCOUNTER — Other Ambulatory Visit: Payer: Self-pay | Admitting: Family Medicine

## 2012-06-08 MED ORDER — POTASSIUM CHLORIDE ER 10 MEQ PO TBCR: 10.0000 meq | EXTENDED_RELEASE_TABLET | Freq: Every day | ORAL | Status: DC

## 2012-06-28 ENCOUNTER — Other Ambulatory Visit: Payer: Self-pay | Admitting: *Deleted

## 2012-06-28 MED ORDER — GLIPIZIDE 10 MG PO TABS: 10.0000 mg | ORAL_TABLET | Freq: Every day | ORAL | Status: DC

## 2012-06-28 NOTE — Telephone Encounter (Signed)
Refill request received for Glucotrol 10 mg.  [facility] pharmacy requests 200 tabs .

## 2012-07-04 NOTE — Telephone Encounter (Addendum)
RX for glucotrol was called to [facility] on 01/29 that [name] had refilled.  We had received fax from [facility] requesting this.    Today received notice from [facility] wanting to verify that RX for glucotrol  had been decreased to once daily.  I called patient and he states he has been taking once daily  since last appointment with [name]. States he has been getting  this med from [facility] . This is actually where rx was sent on 01/29 by [name].  I called [facility] and cancelled rx that was called in there on 01/29.   Appointment scheduled with [name] for 02/06 and advised patient to being all his meds to appointment.

## 2012-07-05 ENCOUNTER — Other Ambulatory Visit: Payer: Self-pay | Admitting: Cardiology

## 2012-07-06 ENCOUNTER — Telehealth: Payer: Self-pay

## 2012-07-06 NOTE — Telephone Encounter (Signed)
Called [facility] to check on refill request. They stated the new prescription for benazepril (LOTENSIN) 20 MG tablet 30 tablet 11 02/18/2012 Take 1 tablet (20 mg total) by mouth daily is ready and has refills.

## 2012-07-07 ENCOUNTER — Ambulatory Visit (INDEPENDENT_AMBULATORY_CARE_PROVIDER_SITE_OTHER): Payer: Medicare HMO | Admitting: Family Medicine

## 2012-07-07 ENCOUNTER — Encounter: Payer: Self-pay | Admitting: Family Medicine

## 2012-07-07 VITALS — BP 138/78 | HR 84 | Ht 69.0 in | Wt 243.0 lb

## 2012-07-07 DIAGNOSIS — E785 Hyperlipidemia, unspecified: Secondary | ICD-10-CM

## 2012-07-07 DIAGNOSIS — I509 Heart failure, unspecified: Secondary | ICD-10-CM

## 2012-07-07 DIAGNOSIS — E559 Vitamin D deficiency, unspecified: Secondary | ICD-10-CM

## 2012-07-07 DIAGNOSIS — I10 Essential (primary) hypertension: Secondary | ICD-10-CM

## 2012-07-07 DIAGNOSIS — J449 Chronic obstructive pulmonary disease, unspecified: Secondary | ICD-10-CM

## 2012-07-07 DIAGNOSIS — E119 Type 2 diabetes mellitus without complications: Secondary | ICD-10-CM

## 2012-07-07 DIAGNOSIS — I2510 Atherosclerotic heart disease of native coronary artery without angina pectoris: Secondary | ICD-10-CM

## 2012-07-07 LAB — POCT GLYCOSYLATED HEMOGLOBIN (HGB A1C): Hemoglobin A1C: 6.5

## 2012-07-07 MED ORDER — ERGOCALCIFEROL 1.25 MG (50000 UT) PO CAPS: 50000.0000 [IU] | ORAL_CAPSULE | ORAL | Status: DC

## 2012-07-07 MED ORDER — POTASSIUM CHLORIDE ER 10 MEQ PO TBCR: 20.0000 meq | EXTENDED_RELEASE_TABLET | Freq: Every day | ORAL | Status: DC

## 2012-07-07 MED ORDER — BENAZEPRIL HCL 20 MG PO TABS: 20.0000 mg | ORAL_TABLET | Freq: Every day | ORAL | Status: DC

## 2012-07-07 MED ORDER — CLOPIDOGREL BISULFATE 75 MG PO TABS: 75.0000 mg | ORAL_TABLET | Freq: Every day | ORAL | Status: DC

## 2012-07-07 MED ORDER — GLIPIZIDE 10 MG PO TABS: 10.0000 mg | ORAL_TABLET | Freq: Every day | ORAL | Status: DC

## 2012-07-07 MED ORDER — ATORVASTATIN CALCIUM 20 MG PO TABS: 20.0000 mg | ORAL_TABLET | Freq: Every day | ORAL | Status: DC

## 2012-07-07 MED ORDER — FUROSEMIDE 40 MG PO TABS: 40.0000 mg | ORAL_TABLET | Freq: Every day | ORAL | Status: DC

## 2012-07-07 NOTE — Assessment & Plan Note (Signed)
Continue current meds.  Last lipids 01-2012 good.

## 2012-07-07 NOTE — Patient Instructions (Addendum)
How much would salmeterol and flovent or beclovent cost separately?  How much for symbicort?  How much for Advair?  Diabetes and Exercise Regular exercise is important and can help:   Control blood glucose (sugar).  Decrease blood pressure.    Control blood lipids (cholesterol, triglycerides).  Improve overall health. BENEFITS FROM EXERCISE  Improved fitness.  Improved flexibility.  Improved endurance.  Increased bone density.  Weight control.  Increased muscle strength.  Decreased body fat.  Improvement of the body's use of insulin, a hormone.  Increased insulin sensitivity.  Reduction of insulin needs.  Reduced stress and tension.  Helps you feel better. People with diabetes who add exercise to their lifestyle gain additional benefits, including:  Weight loss.  Reduced appetite.  Improvement of the body's use of blood glucose.  Decreased risk factors for heart disease:  Lowering of cholesterol and triglycerides.  Raising the level of good cholesterol (high-density lipoproteins, HDL).  Lowering blood sugar.  Decreased blood pressure. TYPE 1 DIABETES AND EXERCISE  Exercise will usually lower your blood glucose.  If blood glucose is greater than 240 mg/dl, check urine ketones. If ketones are present, do not exercise.  Location of the insulin injection sites may need to be adjusted with exercise. Avoid injecting insulin into areas of the body that will be exercised. For example, avoid injecting insulin into:  The arms when playing tennis.  The legs when jogging. For more information, discuss this with your caregiver.  Keep a record of:  Food intake.  Type and amount of exercise.  Expected peak times of insulin action.  Blood glucose levels. Do this before, during, and after exercise. Review your records with your caregiver. This will help you to develop guidelines for adjusting food intake and insulin amounts.  TYPE 2 DIABETES AND  EXERCISE  Regular physical activity can help control blood glucose.  Exercise is important because it may:  Increase the body's sensitivity to insulin.  Improve blood glucose control.  Exercise reduces the risk of heart disease. It decreases serum cholesterol and triglycerides. It also lowers blood pressure.  Those who take insulin or oral hypoglycemic agents should watch for signs of hypoglycemia. These signs include dizziness, shaking, sweating, chills, and confusion.  Body water is lost during exercise. It must be replaced. This will help to avoid loss of body fluids (dehydration) or heat stroke. Be sure to talk to your caregiver before starting an exercise program to make sure it is safe for you. Remember, any activity is better than none.  Document Released: 08/08/2003 Document Revised: 08/10/2011 Document Reviewed: 11/22/2008 [facility] Patient Information 2013 [place]. Chronic Obstructive Pulmonary Disease Chronic obstructive pulmonary disease (COPD) is a condition in which airflow from the lungs is restricted. The lungs can never return to normal, but there are measures you can take which will improve them and make you feel better. CAUSES   Smoking.  Exposure to secondhand smoke.  Breathing in irritants (pollution, cigarette smoke, strong smells, aerosol sprays, paint fumes).  History of lung infections. TREATMENT  Treatment focuses on making you comfortable (supportive care). Your caregiver may prescribe medications (inhaled or pills) to help improve your breathing. HOME CARE INSTRUCTIONS   If you smoke, stop smoking.  Avoid exposure to smoke, chemicals, and fumes that aggravate your breathing.  Take antibiotic medicines as directed by your caregiver.  Avoid medicines that dry up your system and slow down the elimination of secretions (antihistamines and cough syrups). This decreases respiratory capacity and may lead to infections.  Drink enough water and  fluids to keep your urine clear or pale yellow. This loosens secretions.  Use humidifiers at home and at your bedside if they do not make breathing difficult.  Receive all protective vaccines your caregiver suggests, especially pneumococcal and influenza.  Use home oxygen as suggested.  Stay active. Exercise and physical activity will help maintain your ability to do things you want to do.  Eat a healthy diet. SEEK MEDICAL CARE IF:   You develop pus-like mucus (sputum).  Breathing is more labored or exercise becomes difficult to do.  You are running out of the medicine you take for your breathing. SEEK IMMEDIATE MEDICAL CARE IF:   You have a rapid heart rate.  You have agitation, confusion, tremors, or are in a stupor (family members may need to observe this).  It becomes difficult to breathe.  You develop chest pain.  You have a fever. MAKE SURE YOU:   Understand these instructions.  Will watch your condition.  Will get help right away if you are not doing well or get worse. Document Released: 02/25/2005 Document Revised: 08/10/2011 Document Reviewed: 07/18/2010 [facility] Patient Information 2013 [place].

## 2012-07-07 NOTE — Assessment & Plan Note (Signed)
Looking into cheaper alternative to symbicort.

## 2012-07-07 NOTE — Progress Notes (Signed)
  Subjective:    Patient ID: Matthew Edwards, male    DOB: [birth year], 72 y.o.   MRN: [id number]  HPI Here for f/u DM and HTN.  Pt. Had come off his B-blocker secondary to no longer having medication at [facility] to see cards in 07/2012.  He is no longer qualified to be there.  He is not exercising as much as previously secondary to weather.  He reports excellent BS control.  Logs reviewed and none > 152.  He has minimal SOB.   Review of Systems  Constitutional: Negative for fever and chills.  Respiratory: Negative for cough, shortness of breath and wheezing.   Gastrointestinal: Positive for constipation. Negative for nausea, vomiting, abdominal pain and diarrhea.  Genitourinary: Negative for dysuria.  Musculoskeletal: Negative for myalgias.  Skin: Negative for rash.  Neurological: Negative for headaches.       Objective:   Physical Exam  Vitals reviewed. Constitutional: He appears well-developed and well-nourished.  HENT:  Head: Normocephalic and atraumatic.  Eyes: No scleral icterus.  Neck: Neck supple.  Cardiovascular: Normal rate and regular rhythm.   No murmur heard. Pulmonary/Chest: Effort normal. He has no wheezes.  Abdominal: Bowel sounds are normal. There is no tenderness.  Skin: Skin is warm and dry.  Psychiatric: He has a normal mood and affect.          Assessment & Plan:

## 2012-07-07 NOTE — Assessment & Plan Note (Signed)
A1C is good.  Continue exercise and diet.

## 2012-07-07 NOTE — Assessment & Plan Note (Signed)
BP well controlled.

## 2012-07-07 NOTE — Assessment & Plan Note (Signed)
Refilled meds

## 2012-07-27 ENCOUNTER — Other Ambulatory Visit: Payer: Self-pay | Admitting: Family Medicine

## 2012-08-26 ENCOUNTER — Other Ambulatory Visit: Payer: Self-pay | Admitting: Family Medicine

## 2012-09-24 ENCOUNTER — Other Ambulatory Visit: Payer: Self-pay | Admitting: Family Medicine

## 2012-10-02 ENCOUNTER — Other Ambulatory Visit: Payer: Self-pay | Admitting: Family Medicine

## 2012-10-11 ENCOUNTER — Ambulatory Visit (INDEPENDENT_AMBULATORY_CARE_PROVIDER_SITE_OTHER): Payer: Medicare HMO | Admitting: Cardiology

## 2012-10-11 ENCOUNTER — Telehealth: Payer: Self-pay | Admitting: *Deleted

## 2012-10-11 ENCOUNTER — Encounter: Payer: Self-pay | Admitting: Cardiology

## 2012-10-11 VITALS — BP 144/88 | HR 76 | Ht 69.0 in | Wt 249.4 lb

## 2012-10-11 DIAGNOSIS — I2510 Atherosclerotic heart disease of native coronary artery without angina pectoris: Secondary | ICD-10-CM

## 2012-10-11 DIAGNOSIS — I4891 Unspecified atrial fibrillation: Secondary | ICD-10-CM

## 2012-10-11 DIAGNOSIS — I10 Essential (primary) hypertension: Secondary | ICD-10-CM

## 2012-10-11 DIAGNOSIS — I359 Nonrheumatic aortic valve disorder, unspecified: Secondary | ICD-10-CM

## 2012-10-11 DIAGNOSIS — E785 Hyperlipidemia, unspecified: Secondary | ICD-10-CM

## 2012-10-11 DIAGNOSIS — I480 Paroxysmal atrial fibrillation: Secondary | ICD-10-CM | POA: Insufficient documentation

## 2012-10-11 LAB — CBC WITH DIFFERENTIAL/PLATELET
Basophils Absolute: 0 10*3/uL (ref 0.0–0.1)
Basophils Relative: 0.2 % (ref 0.0–3.0)
Eosinophils Absolute: 0.1 10*3/uL (ref 0.0–0.7)
Eosinophils Relative: 1.5 % (ref 0.0–5.0)
HCT: 46.3 % (ref 39.0–52.0)
Hemoglobin: 15.6 g/dL (ref 13.0–17.0)
Lymphocytes Relative: 27.9 % (ref 12.0–46.0)
Lymphs Abs: 1.8 10*3/uL (ref 0.7–4.0)
MCHC: 33.7 g/dL (ref 30.0–36.0)
MCV: 89.3 fl (ref 78.0–100.0)
Monocytes Absolute: 0.5 10*3/uL (ref 0.1–1.0)
Monocytes Relative: 7.6 % (ref 3.0–12.0)
Neutro Abs: 4 10*3/uL (ref 1.4–7.7)
Neutrophils Relative %: 62.8 % (ref 43.0–77.0)
Platelets: 129 10*3/uL — ABNORMAL LOW (ref 150.0–400.0)
RBC: 5.19 Mil/uL (ref 4.22–5.81)
RDW: 14.4 % (ref 11.5–14.6)
WBC: 6.3 10*3/uL (ref 4.5–10.5)

## 2012-10-11 LAB — TSH: TSH: 0.71 u[IU]/mL (ref 0.35–5.50)

## 2012-10-11 LAB — BASIC METABOLIC PANEL
BUN: 13 mg/dL (ref 6–23)
CO2: 29 mEq/L (ref 19–32)
Calcium: 9.7 mg/dL (ref 8.4–10.5)
Chloride: 103 mEq/L (ref 96–112)
Creatinine, Ser: 1.2 mg/dL (ref 0.4–1.5)
GFR: 80.96 mL/min (ref >60.00)
Glucose, Bld: 127 mg/dL — ABNORMAL HIGH (ref 70–99)
Potassium: 4.3 mEq/L (ref 3.5–5.1)
Sodium: 141 mEq/L (ref 135–145)

## 2012-10-11 MED ORDER — WARFARIN SODIUM 5 MG PO TABS: 5.0000 mg | ORAL_TABLET | Freq: Every day | ORAL | Status: DC

## 2012-10-11 NOTE — Patient Instructions (Addendum)
You now have Atrial fibrillation  Stop taking ASA and Plavix.  Start Coumadin 5 mg daily. We will have you follow up in our Coumadin clinic next Monday.  We will check lab work today and will schedule you for an echocardiogram.  I will see you again in 8 weeks.

## 2012-10-11 NOTE — Telephone Encounter (Signed)
Called and talked with pt and made an appt for him to be seen in coumadin clinic on Tuesday 10/18/2012 as he already had scheduled an ECHO at 9:30am  that day and  Pt did ask if could have appt on Tuesday instead of Monday as he did not want to have to come 2 days in a row. Pt instructed to take coumadin as ordered and that when seen in clinic more  Coumadin instruction will be given and he stated understanding.

## 2012-10-11 NOTE — Progress Notes (Signed)
Matthew Edwards Date of Birth: 04-09-1941 Medical Record [id number]  History of Present Illness: Matthew Edwards is seen for followup today. He has a history of coronary disease with prior stenting of the LAD and left circumflex coronary disease in the past. In 2012 he underwent open heart surgery with an LIMA graft to the LAD and aortic valve replacement with a #25 mm Edwards pericardial valve. He has a history of diabetes, hypertension, and hyperlipidemia. On followup today he reports that he is doing well. He denies any symptoms of palpitations, dizziness, shortness of breath, or chest pain. He generally feels quite well.  Current Outpatient Prescriptions on File Prior to Visit  Medication Sig Dispense Refill  . atorvastatin (LIPITOR) 20 MG tablet Take 1 tablet (20 mg total) by mouth daily.  30 tablet  11  . benazepril (LOTENSIN) 20 MG tablet Take 1 tablet (20 mg total) by mouth daily.  30 tablet  11  . budesonide-formoterol (SYMBICORT) 80-4.5 MCG/ACT inhaler Inhale 2 puffs into the lungs 2 (two) times daily.  1 Inhaler  3  . ergocalciferol (VITAMIN D2) 50000 UNITS capsule Take 1 capsule (50,000 Units total) by mouth once a week. Take for 8 weeks  12 capsule  3  . famotidine (PEPCID) 20 MG tablet Take 20 mg by mouth 2 (two) times daily as needed. For acid reflux      . FLUVIRIN INJ injection as directed.      . furosemide (LASIX) 40 MG tablet TAKE ONE TABLET BY MOUTH ONE TIME DAILY  30 tablet  4  . glipiZIDE (GLUCOTROL) 10 MG tablet Take 1 tablet (10 mg total) by mouth daily.  200 tablet  2  . nitroGLYCERIN (NITROSTAT) 0.4 MG SL tablet Place 1 tablet (0.4 mg total) under the tongue every 5 (five) minutes as needed. For chest pain  30 tablet  3  . potassium chloride (K-DUR) 10 MEQ tablet Take 2 tablets (20 mEq total) by mouth daily.  60 tablet  3  . vitamin C (ASCORBIC ACID) 500 MG tablet Take 500 mg by mouth daily.         No current facility-administered medications on file prior to visit.     Allergies  Allergen Reactions  . Pineapple Concentrate Nausea And Vomiting    Past Medical History  Diagnosis Date  . Diabetes mellitus   . Depression   . Hyperlipidemia   . Hypertension   . CAD (coronary artery disease) 2012    [place]ima-LAD  . GERD (gastroesophageal reflux disease)   . S/P AVR     #25 mm Edwards pericardial valve  . Atrial fibrillation     Past Surgical History  Procedure Laterality Date  . Coronary artery bypass graft    . Aortic valve replacement    . Coronary angioplasty with stent placement      History  Smoking status  . Former Smoker  . Quit date: 07/08/2004  Smokeless tobacco  . Never Used    History  Alcohol Use No    Family History  Problem Relation Age of Onset  . Heart disease Mother     Review of Systems: As noted in history of present illness. All other systems were reviewed and are negative.  Physical Exam: BP 144/88  Pulse 76  Ht 5\' 9"  (1.753 m)  Wt 249 lb 6.4 oz (113.127 kg)  BMI 36.81 kg/m2 He is a pleasant black male in no acute distress.The patient is alert and oriented x 3.  The skin is  warm and dry.  Color is normal.  The HEENT exam is normal. The carotids are 2+ without bruits.  There is no thyromegaly.  There is no JVD.  The lungs are clear.   His median sternotomy scar has healed well. The heart exam reveals an irregular rate and rhythm  with a normal S1 and S2.  There is a soft systolic ejection sound at the right upper sternal border.  The PMI is not displaced.   Abdominal exam reveals good bowel sounds.  There is no guarding or rebound.  There is no hepatosplenomegaly or tenderness.  There are no masses.  Exam of the legs reveal no clubbing, cyanosis, or edema.  The legs are without rashes.  The distal pulses are intact.  Cranial nerves II - XII are intact.  Motor and sensory functions are intact.  The gait is normal.  LABORATORY DATA: ECG today demonstrates atrial fibrillation with a controlled ventricular response  of 77 beats per minute. There is a mild T-wave abnormality in the inferior leads. Lab Results  Component Value Date   WBC 6.3 10/11/2012   HGB 15.6 10/11/2012   HCT 46.3 10/11/2012   PLT 129.0* 10/11/2012   GLUCOSE 127* 10/11/2012   CHOL 106 02/19/2012   TRIG 47.0 02/19/2012   HDL 36.90* 02/19/2012   LDLCALC 60 02/19/2012   ALT 17 11/27/2011   AST 17 11/27/2011   NA 141 10/11/2012   K 4.3 10/11/2012   CL 103 10/11/2012   CREATININE 1.2 10/11/2012   BUN 13 10/11/2012   CO2 29 10/11/2012   TSH 0.71 10/11/2012   PSA 0.34 05/27/2009   INR 1.51* 07/28/2010   HGBA1C 6.5 07/07/2012   MICROALBUR 0.50 11/25/2011    Assessment / Plan: 1. Coronary disease status post remote stenting of the LAD and left circumflex. Status post CABG in 2012 with an LIMA graft to the LAD. He is asymptomatic. Continue risk factor modification.  2. Hypertension-well controlled.  3. Aortic stenosis status post aortic valve replacement with a pericardial valve. Postoperative echocardiogram in May of 2012 showed normal valve function.  4. Hyperlipidemia. He is on Lipitor. Last lipid levels were satisfactory.  5. Diabetes mellitus type 2.   6. Atrial fibrillation-new onset. Rate is well controlled and the patient is asymptomatic. He has a [place] score of 2. I have recommended long-term anticoagulation. Given cost concerns and the fact that he has chronic renal insufficiency I think Coumadin would be the best option for him. I recommended starting him on 5 mg daily. We'll have him return to our Coumadin clinic on Monday. I recommended that he stop taking Plavix and aspirin. His blood work today is satisfactory as noted. We will schedule him for an echocardiogram. I'll see him back again in 2 months. Since he is asymptomatic I don't think we need to consider restoration of sinus rhythm at this time.

## 2012-10-18 ENCOUNTER — Ambulatory Visit (HOSPITAL_COMMUNITY): Payer: Medicare HMO | Attending: Cardiology

## 2012-10-18 ENCOUNTER — Ambulatory Visit (INDEPENDENT_AMBULATORY_CARE_PROVIDER_SITE_OTHER): Payer: Medicare HMO | Admitting: *Deleted

## 2012-10-18 DIAGNOSIS — I359 Nonrheumatic aortic valve disorder, unspecified: Secondary | ICD-10-CM

## 2012-10-18 DIAGNOSIS — E785 Hyperlipidemia, unspecified: Secondary | ICD-10-CM

## 2012-10-18 DIAGNOSIS — I10 Essential (primary) hypertension: Secondary | ICD-10-CM

## 2012-10-18 DIAGNOSIS — E119 Type 2 diabetes mellitus without complications: Secondary | ICD-10-CM | POA: Insufficient documentation

## 2012-10-18 DIAGNOSIS — I059 Rheumatic mitral valve disease, unspecified: Secondary | ICD-10-CM | POA: Insufficient documentation

## 2012-10-18 DIAGNOSIS — I4891 Unspecified atrial fibrillation: Secondary | ICD-10-CM | POA: Insufficient documentation

## 2012-10-18 DIAGNOSIS — I379 Nonrheumatic pulmonary valve disorder, unspecified: Secondary | ICD-10-CM | POA: Insufficient documentation

## 2012-10-18 DIAGNOSIS — I079 Rheumatic tricuspid valve disease, unspecified: Secondary | ICD-10-CM | POA: Insufficient documentation

## 2012-10-18 DIAGNOSIS — I2510 Atherosclerotic heart disease of native coronary artery without angina pectoris: Secondary | ICD-10-CM

## 2012-10-18 DIAGNOSIS — Z954 Presence of other heart-valve replacement: Secondary | ICD-10-CM | POA: Insufficient documentation

## 2012-10-18 LAB — POCT INR: INR: 1.1

## 2012-10-18 NOTE — Patient Instructions (Addendum)

## 2012-10-18 NOTE — Progress Notes (Signed)
Echocardiogram performed.  

## 2012-10-27 ENCOUNTER — Ambulatory Visit (INDEPENDENT_AMBULATORY_CARE_PROVIDER_SITE_OTHER): Payer: Medicare HMO | Admitting: *Deleted

## 2012-10-27 DIAGNOSIS — E785 Hyperlipidemia, unspecified: Secondary | ICD-10-CM

## 2012-10-27 DIAGNOSIS — I4891 Unspecified atrial fibrillation: Secondary | ICD-10-CM

## 2012-10-27 DIAGNOSIS — I359 Nonrheumatic aortic valve disorder, unspecified: Secondary | ICD-10-CM

## 2012-10-27 DIAGNOSIS — I10 Essential (primary) hypertension: Secondary | ICD-10-CM

## 2012-10-27 DIAGNOSIS — I2510 Atherosclerotic heart disease of native coronary artery without angina pectoris: Secondary | ICD-10-CM

## 2012-10-27 LAB — POCT INR: INR: 1.9

## 2012-11-01 ENCOUNTER — Telehealth: Payer: Self-pay

## 2012-11-01 NOTE — Telephone Encounter (Signed)
Patient called lab results 10/11/12 given,mildly elevated glucose 127.Advised to keep appointment with Dr.Jordan 12/29/12.

## 2012-11-03 ENCOUNTER — Other Ambulatory Visit: Payer: Self-pay | Admitting: *Deleted

## 2012-11-03 ENCOUNTER — Ambulatory Visit (INDEPENDENT_AMBULATORY_CARE_PROVIDER_SITE_OTHER): Payer: Medicare HMO | Admitting: *Deleted

## 2012-11-03 DIAGNOSIS — I10 Essential (primary) hypertension: Secondary | ICD-10-CM

## 2012-11-03 DIAGNOSIS — I4891 Unspecified atrial fibrillation: Secondary | ICD-10-CM

## 2012-11-03 DIAGNOSIS — I359 Nonrheumatic aortic valve disorder, unspecified: Secondary | ICD-10-CM

## 2012-11-03 DIAGNOSIS — E119 Type 2 diabetes mellitus without complications: Secondary | ICD-10-CM

## 2012-11-03 DIAGNOSIS — E785 Hyperlipidemia, unspecified: Secondary | ICD-10-CM

## 2012-11-03 DIAGNOSIS — I2510 Atherosclerotic heart disease of native coronary artery without angina pectoris: Secondary | ICD-10-CM

## 2012-11-03 LAB — POCT INR: INR: 1.7

## 2012-11-03 MED ORDER — GLIPIZIDE 10 MG PO TABS: 10.0000 mg | ORAL_TABLET | Freq: Every day | ORAL | Status: DC

## 2012-11-03 MED ORDER — ATORVASTATIN CALCIUM 20 MG PO TABS: 20.0000 mg | ORAL_TABLET | Freq: Every day | ORAL | Status: DC

## 2012-11-10 ENCOUNTER — Ambulatory Visit (INDEPENDENT_AMBULATORY_CARE_PROVIDER_SITE_OTHER): Payer: Medicare HMO

## 2012-11-10 DIAGNOSIS — I4891 Unspecified atrial fibrillation: Secondary | ICD-10-CM

## 2012-11-10 DIAGNOSIS — I10 Essential (primary) hypertension: Secondary | ICD-10-CM

## 2012-11-10 DIAGNOSIS — I359 Nonrheumatic aortic valve disorder, unspecified: Secondary | ICD-10-CM

## 2012-11-10 DIAGNOSIS — I2510 Atherosclerotic heart disease of native coronary artery without angina pectoris: Secondary | ICD-10-CM

## 2012-11-10 DIAGNOSIS — E785 Hyperlipidemia, unspecified: Secondary | ICD-10-CM

## 2012-11-10 LAB — POCT INR: INR: 2.4

## 2012-11-24 ENCOUNTER — Ambulatory Visit (INDEPENDENT_AMBULATORY_CARE_PROVIDER_SITE_OTHER): Payer: Medicare HMO | Admitting: *Deleted

## 2012-11-24 DIAGNOSIS — I359 Nonrheumatic aortic valve disorder, unspecified: Secondary | ICD-10-CM

## 2012-11-24 DIAGNOSIS — I2510 Atherosclerotic heart disease of native coronary artery without angina pectoris: Secondary | ICD-10-CM

## 2012-11-24 DIAGNOSIS — I10 Essential (primary) hypertension: Secondary | ICD-10-CM

## 2012-11-24 DIAGNOSIS — I4891 Unspecified atrial fibrillation: Secondary | ICD-10-CM

## 2012-11-24 DIAGNOSIS — E785 Hyperlipidemia, unspecified: Secondary | ICD-10-CM

## 2012-11-24 LAB — POCT INR: INR: 2.1

## 2012-12-08 ENCOUNTER — Other Ambulatory Visit: Payer: Self-pay

## 2012-12-15 ENCOUNTER — Ambulatory Visit (INDEPENDENT_AMBULATORY_CARE_PROVIDER_SITE_OTHER): Payer: Medicare HMO | Admitting: *Deleted

## 2012-12-15 DIAGNOSIS — I10 Essential (primary) hypertension: Secondary | ICD-10-CM

## 2012-12-15 DIAGNOSIS — E785 Hyperlipidemia, unspecified: Secondary | ICD-10-CM

## 2012-12-15 DIAGNOSIS — I359 Nonrheumatic aortic valve disorder, unspecified: Secondary | ICD-10-CM

## 2012-12-15 DIAGNOSIS — I2510 Atherosclerotic heart disease of native coronary artery without angina pectoris: Secondary | ICD-10-CM

## 2012-12-15 DIAGNOSIS — I4891 Unspecified atrial fibrillation: Secondary | ICD-10-CM

## 2012-12-15 LAB — POCT INR: INR: 1.6

## 2012-12-23 ENCOUNTER — Encounter: Payer: Self-pay | Admitting: Family Medicine

## 2012-12-23 ENCOUNTER — Ambulatory Visit (INDEPENDENT_AMBULATORY_CARE_PROVIDER_SITE_OTHER): Payer: Medicare HMO | Admitting: Family Medicine

## 2012-12-23 VITALS — BP 134/88 | HR 89 | Temp 98.6°F | Wt 244.0 lb

## 2012-12-23 DIAGNOSIS — J449 Chronic obstructive pulmonary disease, unspecified: Secondary | ICD-10-CM

## 2012-12-23 DIAGNOSIS — F17200 Nicotine dependence, unspecified, uncomplicated: Secondary | ICD-10-CM

## 2012-12-23 DIAGNOSIS — I4891 Unspecified atrial fibrillation: Secondary | ICD-10-CM

## 2012-12-23 DIAGNOSIS — I10 Essential (primary) hypertension: Secondary | ICD-10-CM

## 2012-12-23 DIAGNOSIS — E119 Type 2 diabetes mellitus without complications: Secondary | ICD-10-CM

## 2012-12-23 LAB — POCT GLYCOSYLATED HEMOGLOBIN (HGB A1C): Hemoglobin A1C: 6.5

## 2012-12-23 LAB — CBC
HCT: 49.4 % (ref 39.0–52.0)
Hemoglobin: 16.8 g/dL (ref 13.0–17.0)
MCH: 30 pg (ref 26.0–34.0)
MCHC: 34 g/dL (ref 30.0–36.0)
MCV: 88.2 fL (ref 78.0–100.0)
Platelets: 132 10*3/uL — ABNORMAL LOW (ref 150–400)
RBC: 5.6 MIL/uL (ref 4.22–5.81)
RDW: 13.8 % (ref 11.5–15.5)
WBC: 6.3 10*3/uL (ref 4.0–10.5)

## 2012-12-23 NOTE — Assessment & Plan Note (Signed)
Well-controlled with symbicort Patient is interested in a cheaper alternative and will check with his insurance company.

## 2012-12-23 NOTE — Assessment & Plan Note (Signed)
A: BP at goal. Meds: compliant.  P: Check electrolytes and CBC

## 2012-12-23 NOTE — Patient Instructions (Addendum)
Matthew Edwards,  Thank you for coming in today. I will call with lab results.   Regarding you vision, please f/u with your eye doctor (ophthalmologist). We will find out who your previous eye doctor was and make an appt.   Regarding dizziness, BP is fine. Take your time standing.   No medication changes today.  Great job with diabetes control!  Dr. Adrian Blackwater

## 2012-12-23 NOTE — Assessment & Plan Note (Signed)
Patient quit smoking in 2007.

## 2012-12-23 NOTE — Progress Notes (Signed)
Subjective:     Patient ID: Matthew Edwards, male   DOB: 1940/06/15, 72 y.o.   MRN: [id number]  HPI 72 year old male with a history of coronary artery disease,  atrial fibrillation on Coumadin therapy, hypertension, diabetes and COPD presents for followup:  1. Hypertension: patient is compliant with his medication. He admits to seeing floaters, dizziness upon standing too quickly and stable shortness of breath with exertion. He denies chest pain, headache and lower extremity edema.   2. A. Fib without RVR: patient is on Coumadin therapy with dose adjustments done by his cardiologist. Recent echo with his abnormal EF 55%.  Patient is able to maintain normal blood pressures. He is not short of breath at rest, weight gain, lower extremity edema. He is compliant with daily Lasix. He also thinks he is compliant with potassium but is not sure.  3. Diabetes type 2: Patient is compliant with glipizide. She denies low blood sugars. He reports a few blood sugars above 200. He is compliant with a moderate carb diet. He does not exercise regularly. He is not sure but believes his last diabetic eye exam was one year ago at [facility] on [address]. He denies tingling or numbness in extremities.  4. COPD: patient compliant with Symbicort. He has stable shortness of breath with exertion. He denies fever, chest pain, purulent productive cough. Patient's concern the Symbicort may be too expensive in the future he was quoted that it may cost him $55 per month.  Review of Systems As per HPI     Objective:   Physical Exam BP 134/88  Pulse 89  Temp(Src) 98.6 F (37 C) (Oral)  Wt 244 lb (110.678 kg)  BMI 36.02 kg/m2 Wt Readings from Last 3 Encounters:  12/23/12 244 lb (110.678 kg)  10/11/12 249 lb 6.4 oz (113.127 kg)  07/07/12 243 lb (110.224 kg)   General appearance: alert, cooperative and no distress Eyes: conjunctivae/corneas clear. PERRL, EOM's intact. Fundi benign. Ears: abnormal  external canal right ear - cerumen removed by irrigation and abnormal external canal left ear - cerumen removed by irrigation. TMs visualized and normal post irrigation. Neck: no adenopathy, no carotid bruit, no JVD, supple, symmetrical, trachea midline and thyroid not enlarged, symmetric, no tenderness/mass/nodules Lungs: clear to auscultation bilaterally Heart: irregularly irregular rhythm and S1, S2 normal Abdomen: enlarged abdomen. Non tender. Normal active bowel sounds.  Extremities: extremities normal, atraumatic, no cyanosis or edema Neurologic: Grossly normal  Lab Results  Component Value Date   HGBA1C 6.5 12/23/2012      Assessment and Plan:

## 2012-12-23 NOTE — Assessment & Plan Note (Addendum)
A: well controlled. A1c at goal Meds: compliant P: continue current regimen. Consider decreasing glipizide if still well controlled at f/u.  Due for diabetic eye exam, referral placed.

## 2012-12-23 NOTE — Assessment & Plan Note (Signed)
A: rate controlled. No evidence of heart failure. INR Subtherapeutic at last check. Meds: Compliant P: patient to keep followup with his cardiologist next week to recheck his INR and adjust his Coumadin.

## 2012-12-24 LAB — COMPLETE METABOLIC PANEL WITH GFR
ALT: 18 U/L (ref 0–53)
AST: 20 U/L (ref 0–37)
Albumin: 4.4 g/dL (ref 3.5–5.2)
Alkaline Phosphatase: 67 U/L (ref 39–117)
BUN: 14 mg/dL (ref 6–23)
CO2: 30 mEq/L (ref 19–32)
Calcium: 10.2 mg/dL (ref 8.4–10.5)
Chloride: 102 mEq/L (ref 96–112)
Creat: 1.22 mg/dL (ref 0.50–1.35)
GFR, Est African American: 69 mL/min
GFR, Est Non African American: 60 mL/min
Glucose, Bld: 112 mg/dL — ABNORMAL HIGH (ref 70–99)
Potassium: 4.1 mEq/L (ref 3.5–5.3)
Sodium: 139 mEq/L (ref 135–145)
Total Bilirubin: 0.7 mg/dL (ref 0.3–1.2)
Total Protein: 7.5 g/dL (ref 6.0–8.3)

## 2012-12-24 LAB — LDL CHOLESTEROL, DIRECT: Direct LDL: 81 mg/dL

## 2012-12-26 ENCOUNTER — Telehealth: Payer: Self-pay | Admitting: *Deleted

## 2012-12-26 NOTE — Telephone Encounter (Signed)
LM with male who answered the phone.  Asked that [name] give me a call back about her father.  Please ask the following:  1.  Does she remember where he went to the eye doctor at?  He thinks it was over near [facility].  Does she think it was [place]?  2.  What is a good time for an appt as pt states that his daughter would be able to take him. Fleeger, [name]

## 2012-12-27 NOTE — Telephone Encounter (Signed)
LMOVM for Matthew Edwards to return call.  Contacted her @ [phone]. Joycelin Radloff, [name]

## 2012-12-28 ENCOUNTER — Encounter: Payer: Self-pay | Admitting: Family Medicine

## 2012-12-29 ENCOUNTER — Ambulatory Visit (INDEPENDENT_AMBULATORY_CARE_PROVIDER_SITE_OTHER): Payer: Medicare HMO | Admitting: *Deleted

## 2012-12-29 ENCOUNTER — Encounter: Payer: Self-pay | Admitting: Cardiology

## 2012-12-29 ENCOUNTER — Ambulatory Visit (INDEPENDENT_AMBULATORY_CARE_PROVIDER_SITE_OTHER): Payer: Medicare HMO | Admitting: Cardiology

## 2012-12-29 VITALS — BP 158/100 | HR 74 | Ht 69.0 in | Wt 245.6 lb

## 2012-12-29 DIAGNOSIS — E119 Type 2 diabetes mellitus without complications: Secondary | ICD-10-CM

## 2012-12-29 DIAGNOSIS — Z954 Presence of other heart-valve replacement: Secondary | ICD-10-CM

## 2012-12-29 DIAGNOSIS — I4891 Unspecified atrial fibrillation: Secondary | ICD-10-CM

## 2012-12-29 DIAGNOSIS — I2510 Atherosclerotic heart disease of native coronary artery without angina pectoris: Secondary | ICD-10-CM

## 2012-12-29 DIAGNOSIS — I359 Nonrheumatic aortic valve disorder, unspecified: Secondary | ICD-10-CM

## 2012-12-29 DIAGNOSIS — I10 Essential (primary) hypertension: Secondary | ICD-10-CM

## 2012-12-29 DIAGNOSIS — E785 Hyperlipidemia, unspecified: Secondary | ICD-10-CM

## 2012-12-29 DIAGNOSIS — Z952 Presence of prosthetic heart valve: Secondary | ICD-10-CM | POA: Insufficient documentation

## 2012-12-29 LAB — POCT INR: INR: 1.8

## 2012-12-29 MED ORDER — WARFARIN SODIUM 5 MG PO TABS: 5.0000 mg | ORAL_TABLET | ORAL | Status: DC

## 2012-12-29 NOTE — Patient Instructions (Signed)
Continue your current therapy and follow up in the Coumadin clinic  I will see you in 6 months.

## 2012-12-29 NOTE — Progress Notes (Signed)
Matthew Edwards Date of Birth: July 22, 1940 Medical Record [id number]  History of Present Illness: Matthew Edwards is seen for followup today. He has a history of coronary disease with prior stenting of the LAD and left circumflex coronary disease in the past. In 2012 he underwent open heart surgery with an LIMA graft to the LAD and aortic valve replacement with a #25 mm Edwards pericardial valve. He has a history of diabetes, hypertension, and hyperlipidemia. On his last visit he was found to have new onset of atrial fibrillation with a controlled ventricular response. He was asymptomatic. We started him on anticoagulation with Coumadin. He remains asymptomatic. He denies any chest pain or shortness of breath. He has no palpitations or dizziness. He has had no bleeding difficulties. Overall he feels well.  Current Outpatient Prescriptions on File Prior to Visit  Medication Sig Dispense Refill  . atorvastatin (LIPITOR) 20 MG tablet Take 1 tablet (20 mg total) by mouth daily.  30 tablet  11  . benazepril (LOTENSIN) 20 MG tablet Take 1 tablet (20 mg total) by mouth daily.  30 tablet  11  . budesonide-formoterol (SYMBICORT) 80-4.5 MCG/ACT inhaler Inhale 2 puffs into the lungs 2 (two) times daily.  1 Inhaler  3  . cholecalciferol (VITAMIN D) 1000 UNITS tablet Take 1,000 Units by mouth daily.      . famotidine (PEPCID) 20 MG tablet Take 20 mg by mouth 2 (two) times daily as needed. For acid reflux      . furosemide (LASIX) 40 MG tablet TAKE ONE TABLET BY MOUTH ONE TIME DAILY  30 tablet  4  . glipiZIDE (GLUCOTROL) 10 MG tablet Take 1 tablet (10 mg total) by mouth daily.  200 tablet  2  . nitroGLYCERIN (NITROSTAT) 0.4 MG SL tablet Place 1 tablet (0.4 mg total) under the tongue every 5 (five) minutes as needed. For chest pain  30 tablet  3  . potassium chloride (K-DUR) 10 MEQ tablet Take 2 tablets (20 mEq total) by mouth daily.  60 tablet  3  . vitamin C (ASCORBIC ACID) 500 MG tablet Take 500 mg by mouth daily.          No current facility-administered medications on file prior to visit.    Allergies  Allergen Reactions  . Pineapple Concentrate Nausea And Vomiting    Past Medical History  Diagnosis Date  . Diabetes mellitus   . Depression   . Hyperlipidemia   . Hypertension   . CAD (coronary artery disease) 2012    Lima-LAD  . GERD (gastroesophageal reflux disease)   . S/P AVR     #25 mm Edwards pericardial valve  . Atrial fibrillation   . HEART FAILURE, CONGESTIVE UNSPEC 02/23/2007    Qualifier: Diagnosis of  By: Mellody Drown MD, [facility]    . TOBACCO DEPENDENCE 07/29/2006    Qualifier: History of  By: Carlena Sax  MD, Colletta [place]      Past Surgical History  Procedure Laterality Date  . Coronary artery bypass graft    . Aortic valve replacement    . Coronary angioplasty with stent placement      History  Smoking status  . Former Smoker  . Quit date: 07/08/2004  Smokeless tobacco  . Never Used    History  Alcohol Use No    Family History  Problem Relation Age of Onset  . Heart disease Mother     Review of Systems: As noted in history of present illness. All other systems were reviewed and are negative.  Physical Exam: BP 158/100  Pulse 74  Ht 5\' 9"  (1.753 m)  Wt 245 lb 9.6 oz (111.403 kg)  BMI 36.25 kg/m2  SpO2 96% He is a pleasant black male in no acute distress.The patient is alert and oriented x 3.  The skin is warm and dry. The HEENT exam is normal. The carotids are 2+ without bruits.  There is no thyromegaly.  There is no JVD.  The lungs are clear.   His median sternotomy scar has healed well. The heart exam reveals an irregular rate and rhythm  with a normal S1 and S2.  There is a soft systolic ejection sound at the right upper sternal border.  The PMI is not displaced.   Abdominal exam reveals good bowel sounds.  There is no guarding or rebound.  There is no hepatosplenomegaly or tenderness.  There are no masses.  Exam of the legs reveal no clubbing, cyanosis, or edema.  The  legs are without rashes.  The distal pulses are intact.  Cranial nerves II - XII are intact.  Motor and sensory functions are intact.  The gait is normal.  LABORATORY DATA:  Lab Results  Component Value Date   WBC 6.3 12/23/2012   HGB 16.8 12/23/2012   HCT 49.4 12/23/2012   PLT 132* 12/23/2012   GLUCOSE 112* 12/23/2012   CHOL 106 02/19/2012   TRIG 47.0 02/19/2012   HDL 36.90* 02/19/2012   LDLDIRECT 81 12/23/2012   LDLCALC 60 02/19/2012   ALT 18 12/23/2012   AST 20 12/23/2012   NA 139 12/23/2012   K 4.1 12/23/2012   CL 102 12/23/2012   CREATININE 1.22 12/23/2012   BUN 14 12/23/2012   CO2 30 12/23/2012   TSH 0.71 10/11/2012   PSA 0.34 05/27/2009   INR 1.8 12/29/2012   HGBA1C 6.5 12/23/2012   MICROALBUR 0.50 11/25/2011   Echo:Study Conclusions  - Left ventricle: The cavity size was normal. Wall thickness was increased in a pattern of moderate LVH. Systolic function was normal. The estimated ejection fraction was in the range of 55% to 65%. Wall motion was normal; there were no regional wall motion abnormalities. - Aortic valve: A mechanical prosthesis was present. - Mitral valve: Calcified annulus. Mildly thickened leaflets . - Left atrium: The atrium was moderately dilated. - Right atrium: The atrium was mildly dilated. - Atrial septum: No defect or patent foramen ovale was identified. - Tricuspid valve: Moderate regurgitation. - Pulmonary arteries: PA peak pressure: 52mm Hg (S).   Assessment / Plan: 1. Coronary disease status post remote stenting of the LAD and left circumflex. Status post CABG in 2012 with an LIMA graft to the LAD. He is asymptomatic. Continue risk factor modification.  2. Hypertension-blood pressure was elevated today but he has not yet taken his morning medication. Blood pressure 6 days ago was normal.  3. Aortic stenosis status post aortic valve replacement with a pericardial valve. Postoperative echocardiogram in May of 2012 showed normal valve function.  4.  Hyperlipidemia. He is on Lipitor. Last lipid levels were satisfactory.  5. Diabetes mellitus type 2.   6. Atrial fibrillation-permanent. Rate is well controlled and the patient is asymptomatic. He has a [place] score of 2. I have recommended long-term anticoagulation with Coumadin. Continue followup in our Coumadin clinic. INR was low today at 1.8 and his dose was adjusted.

## 2013-01-09 IMAGING — CT CT HEAD W/O CM
1 of 2 series · 16 of 30 positions shown, 20 images · non-contrast
Comparison: None

CLINICAL DATA: Dizziness and nausea.

CT HEAD WITHOUT CONTRAST
TECHNIQUE: Contiguous axial images were obtained from the base of
the skull through the vertex without contrast.

[Series 3: recon 2: brain · axial · 0.47mm/px · z∈[+129,+251]mm · 16 of 72 slices shown, 20 images]
[im 4/72  brain]
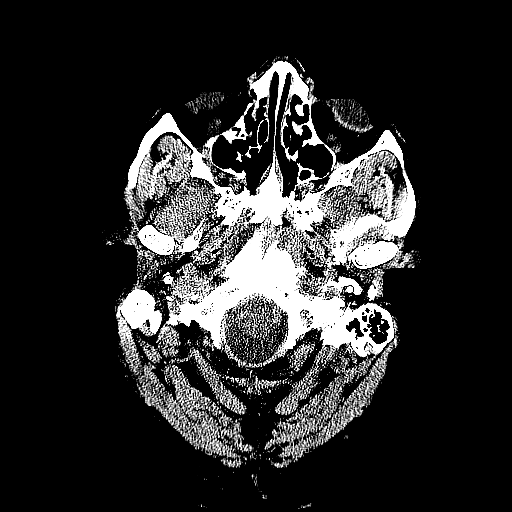
[im 4/72  bone]
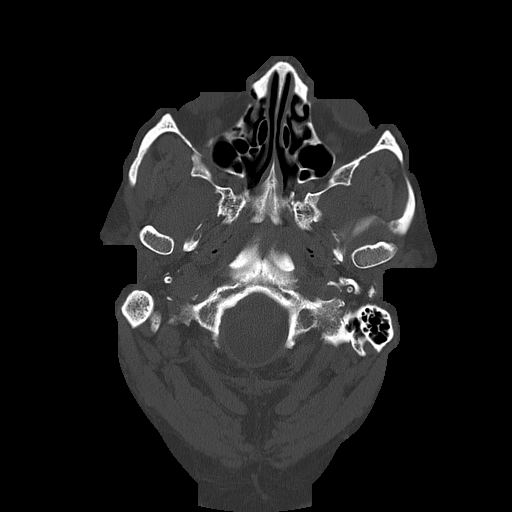
[im 8/72  brain]
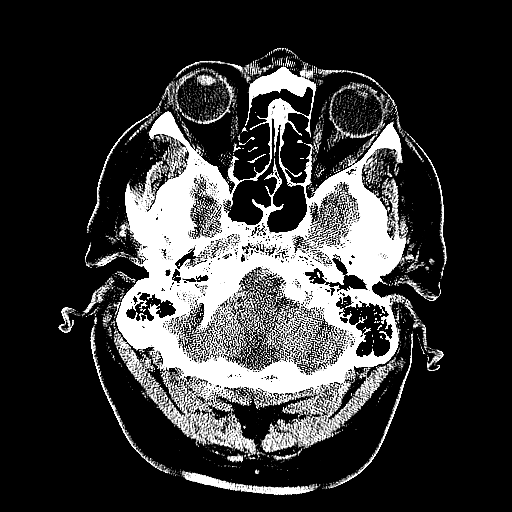
[im 12/72  brain]
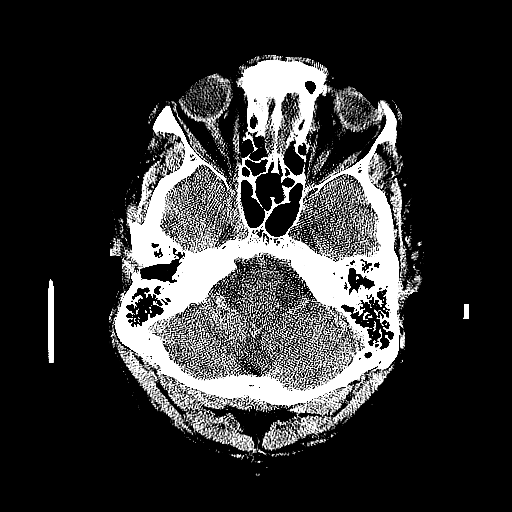
[im 15/72  brain]
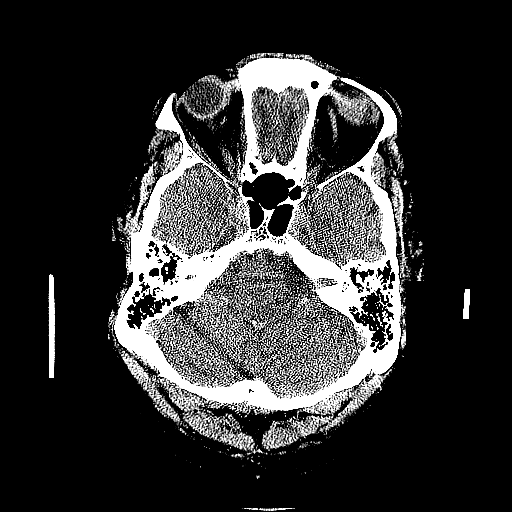
[im 23/72  brain]
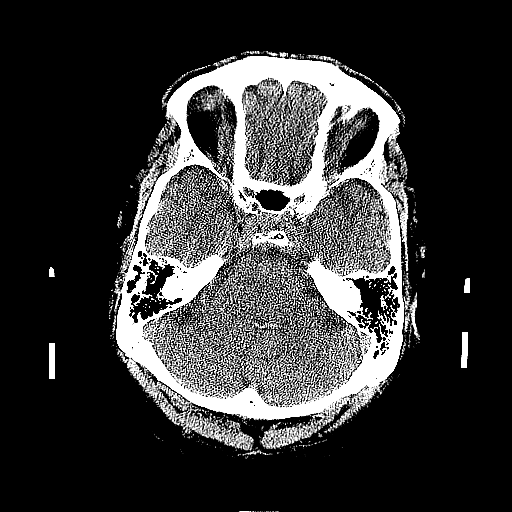
[im 23/72  bone]
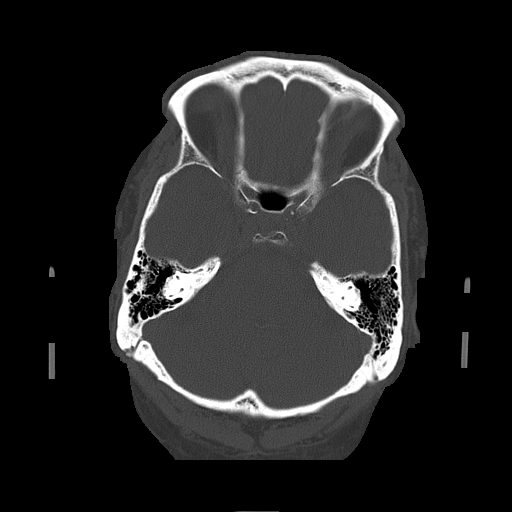
[im 27/72  brain]
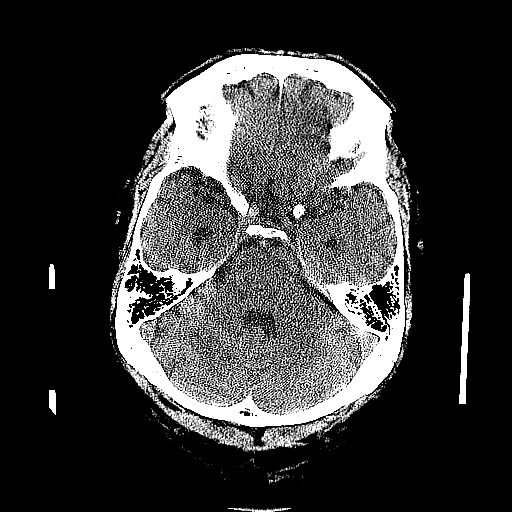
[im 30/72  brain]
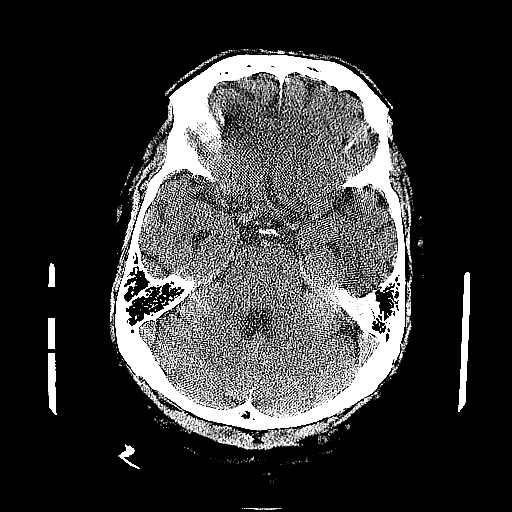
[im 34/72  brain]
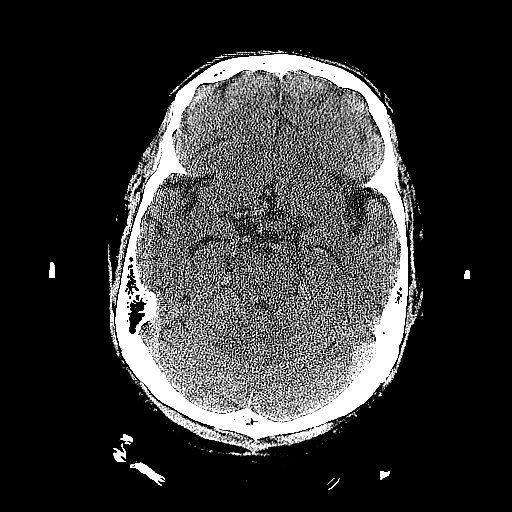
[im 38/72  brain]
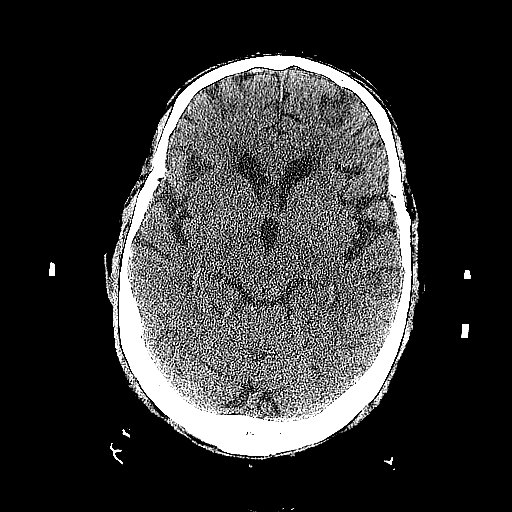
[im 38/72  bone]
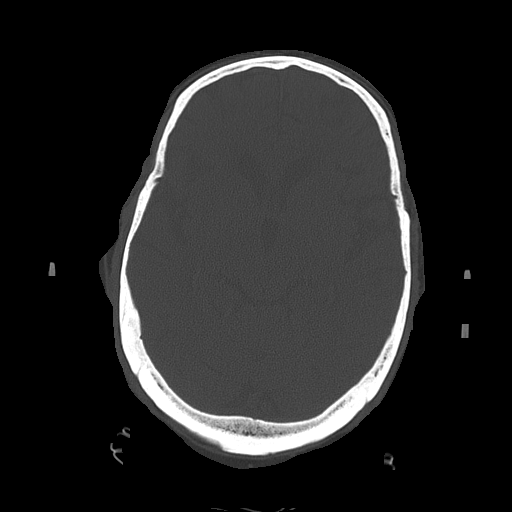
[im 42/72  brain]
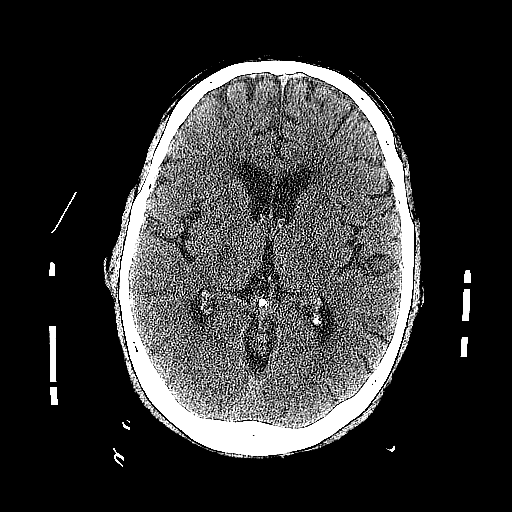
[im 45/72  brain]
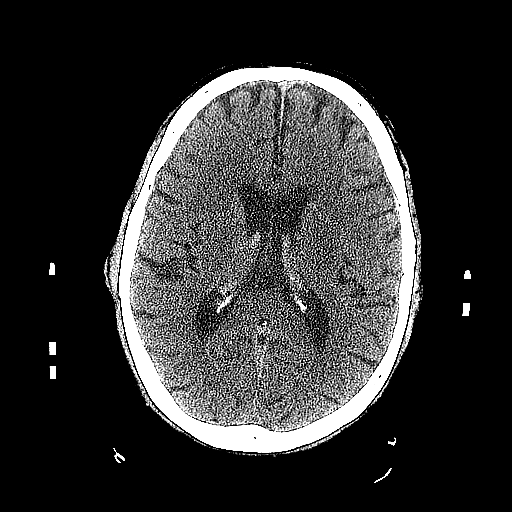
[im 49/72  brain]
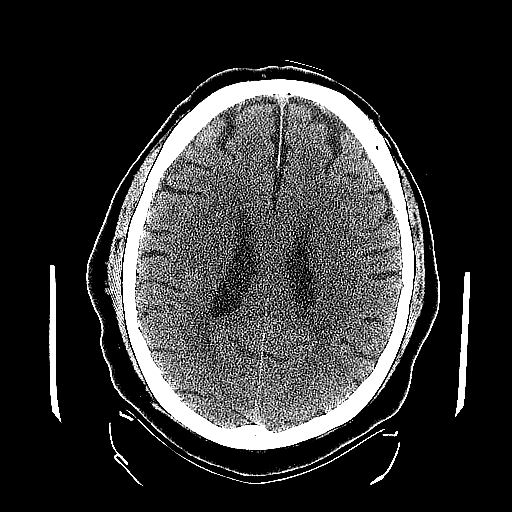
[im 57/72  brain]
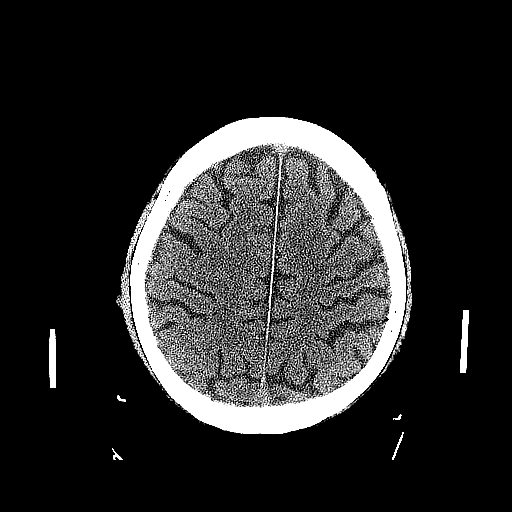
[im 57/72  bone]
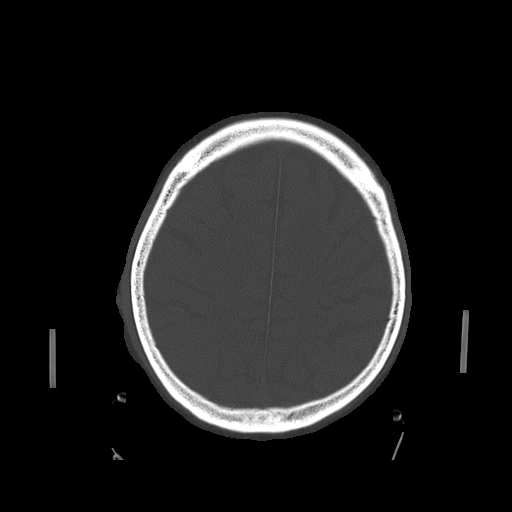
[im 60/72  brain]
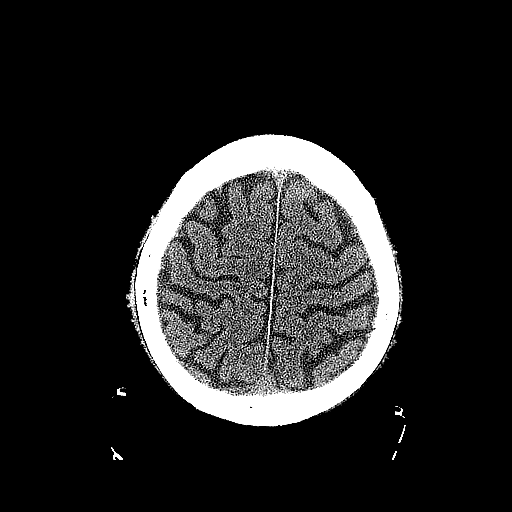
[im 64/72  brain]
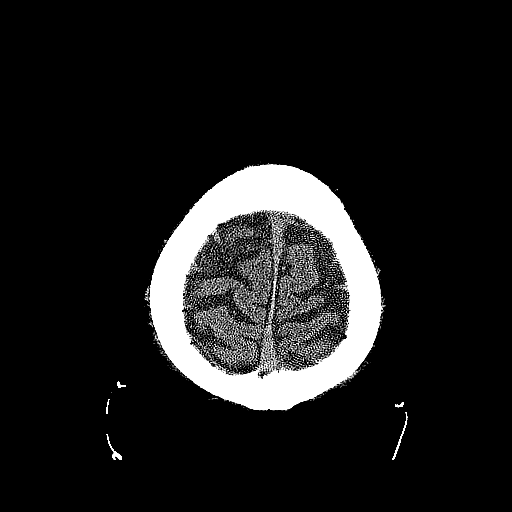
[im 68/72  brain]
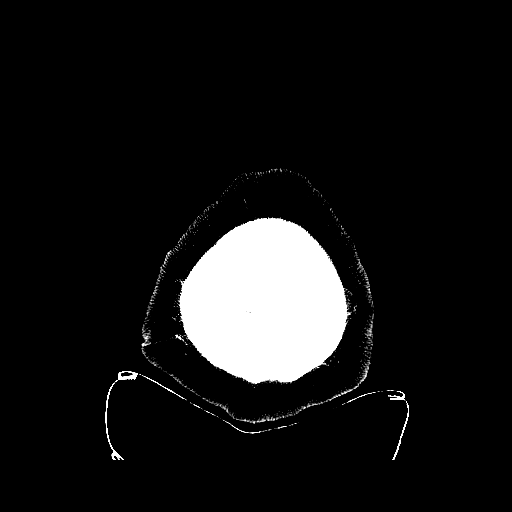

[16 of 30 positions shown; findings below may reference images not displayed]

FINDINGS: Mild periventricular white matter hypodensities likely
represent chronic small vessel white matter ischemic changes.

No acute intracranial abnormalities are identified, including mass
lesion or mass effect, hydrocephalus, extra-axial fluid collection,
midline shift, hemorrhage, or acute infarction.

The visualized bony calvarium is unremarkable.
IMPRESSION: No evidence of acute intracranial abnormality.

Probable mild chronic small vessel white matter ischemic changes.

## 2013-01-12 ENCOUNTER — Ambulatory Visit (INDEPENDENT_AMBULATORY_CARE_PROVIDER_SITE_OTHER): Payer: Medicare HMO | Admitting: *Deleted

## 2013-01-12 DIAGNOSIS — I2510 Atherosclerotic heart disease of native coronary artery without angina pectoris: Secondary | ICD-10-CM

## 2013-01-12 DIAGNOSIS — I10 Essential (primary) hypertension: Secondary | ICD-10-CM

## 2013-01-12 DIAGNOSIS — I359 Nonrheumatic aortic valve disorder, unspecified: Secondary | ICD-10-CM

## 2013-01-12 DIAGNOSIS — I4891 Unspecified atrial fibrillation: Secondary | ICD-10-CM

## 2013-01-12 DIAGNOSIS — E785 Hyperlipidemia, unspecified: Secondary | ICD-10-CM

## 2013-01-12 LAB — POCT INR: INR: 2.4

## 2013-01-26 ENCOUNTER — Ambulatory Visit: Payer: Medicare HMO | Admitting: Family Medicine

## 2013-02-02 ENCOUNTER — Ambulatory Visit (INDEPENDENT_AMBULATORY_CARE_PROVIDER_SITE_OTHER): Payer: Medicare HMO | Admitting: *Deleted

## 2013-02-02 DIAGNOSIS — I10 Essential (primary) hypertension: Secondary | ICD-10-CM

## 2013-02-02 DIAGNOSIS — I2510 Atherosclerotic heart disease of native coronary artery without angina pectoris: Secondary | ICD-10-CM

## 2013-02-02 DIAGNOSIS — E785 Hyperlipidemia, unspecified: Secondary | ICD-10-CM

## 2013-02-02 DIAGNOSIS — I359 Nonrheumatic aortic valve disorder, unspecified: Secondary | ICD-10-CM

## 2013-02-02 DIAGNOSIS — I4891 Unspecified atrial fibrillation: Secondary | ICD-10-CM

## 2013-02-02 LAB — POCT INR: INR: 1.7

## 2013-02-09 ENCOUNTER — Telehealth: Payer: Self-pay | Admitting: Family Medicine

## 2013-02-09 NOTE — Telephone Encounter (Signed)
Daughter called to see if there is different medication that we can prescribe for her father other than symbicort? This cost to much and would like something a little cheaper call in. JW

## 2013-02-14 NOTE — Telephone Encounter (Signed)
There is some coupons available or could we go through [facility]--there really are not alternatives.  Could break it down component parts.  Can we see what options pharmacy can think of.

## 2013-02-15 NOTE — Telephone Encounter (Signed)
LMOVM of [facility] pharmacy for callback.  Will ask about options and if they will be able to help. Matthew Edwards, [name]

## 2013-02-16 ENCOUNTER — Ambulatory Visit (INDEPENDENT_AMBULATORY_CARE_PROVIDER_SITE_OTHER): Payer: Medicare HMO | Admitting: *Deleted

## 2013-02-16 DIAGNOSIS — I10 Essential (primary) hypertension: Secondary | ICD-10-CM

## 2013-02-16 DIAGNOSIS — I359 Nonrheumatic aortic valve disorder, unspecified: Secondary | ICD-10-CM

## 2013-02-16 DIAGNOSIS — I4891 Unspecified atrial fibrillation: Secondary | ICD-10-CM

## 2013-02-16 DIAGNOSIS — I2510 Atherosclerotic heart disease of native coronary artery without angina pectoris: Secondary | ICD-10-CM

## 2013-02-16 DIAGNOSIS — E785 Hyperlipidemia, unspecified: Secondary | ICD-10-CM

## 2013-02-16 LAB — POCT INR: INR: 1.3

## 2013-02-16 MED ORDER — WARFARIN SODIUM 5 MG PO TABS: ORAL_TABLET | ORAL | Status: DC

## 2013-02-27 ENCOUNTER — Other Ambulatory Visit: Payer: Self-pay | Admitting: Cardiology

## 2013-02-27 ENCOUNTER — Telehealth: Payer: Self-pay | Admitting: Family Medicine

## 2013-02-27 DIAGNOSIS — J449 Chronic obstructive pulmonary disease, unspecified: Secondary | ICD-10-CM

## 2013-02-27 MED ORDER — BUDESONIDE-FORMOTEROL FUMARATE 80-4.5 MCG/ACT IN AERO: 2.0000 | INHALATION_SPRAY | Freq: Two times a day (BID) | RESPIRATORY_TRACT | Status: DC

## 2013-02-27 NOTE — Telephone Encounter (Signed)
Will forward to MD. Davonda Ausley,CMA  

## 2013-02-27 NOTE — Telephone Encounter (Signed)
Daughter is calling again.  Father now has medicare. Would like to have Symbicort or a generic called into [facility]. [facility] on [address]. Please advise  Phone #  [phone] for [facility] that it is urgernt

## 2013-02-28 NOTE — Telephone Encounter (Signed)
Left message for daughter making her aware this was taken care of.  Johnney Ou

## 2013-03-02 ENCOUNTER — Ambulatory Visit (INDEPENDENT_AMBULATORY_CARE_PROVIDER_SITE_OTHER): Payer: Medicare HMO | Admitting: *Deleted

## 2013-03-02 DIAGNOSIS — I10 Essential (primary) hypertension: Secondary | ICD-10-CM

## 2013-03-02 DIAGNOSIS — E785 Hyperlipidemia, unspecified: Secondary | ICD-10-CM

## 2013-03-02 DIAGNOSIS — I359 Nonrheumatic aortic valve disorder, unspecified: Secondary | ICD-10-CM

## 2013-03-02 DIAGNOSIS — I4891 Unspecified atrial fibrillation: Secondary | ICD-10-CM

## 2013-03-02 DIAGNOSIS — I2510 Atherosclerotic heart disease of native coronary artery without angina pectoris: Secondary | ICD-10-CM

## 2013-03-02 LAB — POCT INR: INR: 3.1

## 2013-03-16 ENCOUNTER — Ambulatory Visit (INDEPENDENT_AMBULATORY_CARE_PROVIDER_SITE_OTHER): Payer: Medicare HMO | Admitting: General Practice

## 2013-03-16 DIAGNOSIS — I2510 Atherosclerotic heart disease of native coronary artery without angina pectoris: Secondary | ICD-10-CM

## 2013-03-16 DIAGNOSIS — I4891 Unspecified atrial fibrillation: Secondary | ICD-10-CM

## 2013-03-16 DIAGNOSIS — I10 Essential (primary) hypertension: Secondary | ICD-10-CM

## 2013-03-16 DIAGNOSIS — I359 Nonrheumatic aortic valve disorder, unspecified: Secondary | ICD-10-CM

## 2013-03-16 DIAGNOSIS — E785 Hyperlipidemia, unspecified: Secondary | ICD-10-CM

## 2013-03-16 LAB — POCT INR: INR: 2

## 2013-03-29 ENCOUNTER — Other Ambulatory Visit: Payer: Self-pay | Admitting: Family Medicine

## 2013-04-06 ENCOUNTER — Ambulatory Visit (INDEPENDENT_AMBULATORY_CARE_PROVIDER_SITE_OTHER): Payer: Medicare HMO | Admitting: General Practice

## 2013-04-06 DIAGNOSIS — I10 Essential (primary) hypertension: Secondary | ICD-10-CM

## 2013-04-06 DIAGNOSIS — I359 Nonrheumatic aortic valve disorder, unspecified: Secondary | ICD-10-CM

## 2013-04-06 DIAGNOSIS — E785 Hyperlipidemia, unspecified: Secondary | ICD-10-CM

## 2013-04-06 DIAGNOSIS — I4891 Unspecified atrial fibrillation: Secondary | ICD-10-CM

## 2013-04-06 DIAGNOSIS — I2510 Atherosclerotic heart disease of native coronary artery without angina pectoris: Secondary | ICD-10-CM

## 2013-04-06 LAB — POCT INR: INR: 4

## 2013-04-13 ENCOUNTER — Telehealth: Payer: Self-pay | Admitting: Family Medicine

## 2013-04-13 DIAGNOSIS — J449 Chronic obstructive pulmonary disease, unspecified: Secondary | ICD-10-CM

## 2013-04-13 MED ORDER — BUDESONIDE-FORMOTEROL FUMARATE 80-4.5 MCG/ACT IN AERO: 2.0000 | INHALATION_SPRAY | Freq: Two times a day (BID) | RESPIRATORY_TRACT | Status: DC

## 2013-04-13 NOTE — Telephone Encounter (Signed)
Spoke with daughter, she was unhappy that he had not received this medication yet.  Apologized and advised that I do not see any refill request from [facility].  Advised I would give pt a 2 month supply but appt would have to be made for additional refills, this makes daughter even more mad, but she is agreeable.  She then ask if the medication can be generic so it is not some much, advised that was up to the pharmacy depending on what they have in stock. Fleeger, [name]

## 2013-04-13 NOTE — Telephone Encounter (Signed)
Daughter needs to get dad's refill of Symbicort to [facility] on [address].  (If she doesn't answer first time then call her right back...she has to step out in hall to take call)

## 2013-04-20 ENCOUNTER — Ambulatory Visit (INDEPENDENT_AMBULATORY_CARE_PROVIDER_SITE_OTHER): Payer: Medicare HMO | Admitting: General Practice

## 2013-04-20 DIAGNOSIS — I2510 Atherosclerotic heart disease of native coronary artery without angina pectoris: Secondary | ICD-10-CM

## 2013-04-20 DIAGNOSIS — I10 Essential (primary) hypertension: Secondary | ICD-10-CM

## 2013-04-20 DIAGNOSIS — I4891 Unspecified atrial fibrillation: Secondary | ICD-10-CM

## 2013-04-20 DIAGNOSIS — E785 Hyperlipidemia, unspecified: Secondary | ICD-10-CM

## 2013-04-20 DIAGNOSIS — I359 Nonrheumatic aortic valve disorder, unspecified: Secondary | ICD-10-CM

## 2013-04-20 LAB — POCT INR: INR: 2.2

## 2013-05-11 ENCOUNTER — Ambulatory Visit (INDEPENDENT_AMBULATORY_CARE_PROVIDER_SITE_OTHER): Payer: Medicare HMO | Admitting: *Deleted

## 2013-05-11 DIAGNOSIS — I4891 Unspecified atrial fibrillation: Secondary | ICD-10-CM

## 2013-05-11 DIAGNOSIS — I359 Nonrheumatic aortic valve disorder, unspecified: Secondary | ICD-10-CM

## 2013-05-11 DIAGNOSIS — I2510 Atherosclerotic heart disease of native coronary artery without angina pectoris: Secondary | ICD-10-CM

## 2013-05-11 DIAGNOSIS — I10 Essential (primary) hypertension: Secondary | ICD-10-CM

## 2013-05-11 DIAGNOSIS — E785 Hyperlipidemia, unspecified: Secondary | ICD-10-CM

## 2013-05-11 LAB — POCT INR: INR: 2.5

## 2013-05-17 ENCOUNTER — Ambulatory Visit (INDEPENDENT_AMBULATORY_CARE_PROVIDER_SITE_OTHER): Payer: Medicare HMO | Admitting: Family Medicine

## 2013-05-17 ENCOUNTER — Encounter: Payer: Self-pay | Admitting: Family Medicine

## 2013-05-17 VITALS — BP 144/78 | HR 92 | Temp 98.5°F | Ht 69.0 in | Wt 247.0 lb

## 2013-05-17 DIAGNOSIS — J449 Chronic obstructive pulmonary disease, unspecified: Secondary | ICD-10-CM

## 2013-05-17 DIAGNOSIS — E119 Type 2 diabetes mellitus without complications: Secondary | ICD-10-CM

## 2013-05-17 DIAGNOSIS — I4891 Unspecified atrial fibrillation: Secondary | ICD-10-CM

## 2013-05-17 DIAGNOSIS — E785 Hyperlipidemia, unspecified: Secondary | ICD-10-CM

## 2013-05-17 DIAGNOSIS — I10 Essential (primary) hypertension: Secondary | ICD-10-CM

## 2013-05-17 DIAGNOSIS — E1142 Type 2 diabetes mellitus with diabetic polyneuropathy: Secondary | ICD-10-CM

## 2013-05-17 DIAGNOSIS — J4489 Other specified chronic obstructive pulmonary disease: Secondary | ICD-10-CM

## 2013-05-17 DIAGNOSIS — E1149 Type 2 diabetes mellitus with other diabetic neurological complication: Secondary | ICD-10-CM

## 2013-05-17 DIAGNOSIS — E1140 Type 2 diabetes mellitus with diabetic neuropathy, unspecified: Secondary | ICD-10-CM

## 2013-05-17 LAB — POCT GLYCOSYLATED HEMOGLOBIN (HGB A1C): Hemoglobin A1C: 6.3

## 2013-05-17 MED ORDER — FLUTICASONE PROPIONATE HFA 110 MCG/ACT IN AERO: 2.0000 | INHALATION_SPRAY | Freq: Two times a day (BID) | RESPIRATORY_TRACT | Status: DC

## 2013-05-17 MED ORDER — BENAZEPRIL HCL 20 MG PO TABS: 20.0000 mg | ORAL_TABLET | Freq: Every day | ORAL | Status: DC

## 2013-05-17 MED ORDER — GLIPIZIDE 10 MG PO TABS: 10.0000 mg | ORAL_TABLET | Freq: Every day | ORAL | Status: DC

## 2013-05-17 MED ORDER — POTASSIUM CHLORIDE ER 10 MEQ PO TBCR: 20.0000 meq | EXTENDED_RELEASE_TABLET | Freq: Every day | ORAL | Status: DC

## 2013-05-17 MED ORDER — BUDESONIDE-FORMOTEROL FUMARATE 80-4.5 MCG/ACT IN AERO: 2.0000 | INHALATION_SPRAY | Freq: Two times a day (BID) | RESPIRATORY_TRACT | Status: DC

## 2013-05-17 MED ORDER — SALMETEROL XINAFOATE 50 MCG/DOSE IN AEPB: 1.0000 | INHALATION_SPRAY | Freq: Two times a day (BID) | RESPIRATORY_TRACT | Status: DC

## 2013-05-17 NOTE — Assessment & Plan Note (Signed)
Referral to Podiatry

## 2013-05-17 NOTE — Assessment & Plan Note (Signed)
Change Symbicort to Serevent and Flovent---

## 2013-05-17 NOTE — Assessment & Plan Note (Addendum)
Would like to be able to stop coumadin due to too much money for appointments for INR. Will discuss with cards at next visit.

## 2013-05-17 NOTE — Progress Notes (Signed)
   Subjective:    Patient ID: Matthew Edwards, male    DOB: Jul 24, 1940, 72 y.o.   MRN: [id number]  HPI  BS Log over last 4 months 116-187, most < 150. Now with Atrial Fibrillation on coumadin and has somewhat unstable INR. Pt. Desires to possibly change this due to cost. Also interested in changing rx for Symbicort due to cost.    Review of Systems  Constitutional: Negative for fever and chills.  Respiratory: Negative for shortness of breath.   Cardiovascular: Negative for leg swelling.  Gastrointestinal: Negative for abdominal pain.       Objective:   Physical Exam  Constitutional: He appears well-developed and well-nourished. No distress.  HENT:  Head: Normocephalic and atraumatic.  Neck: Neck supple.  Cardiovascular: An irregularly irregular rhythm present. Exam reveals distant heart sounds.   Abdominal: Soft. There is no tenderness.  Skin: Skin is warm and dry.  Psychiatric: He has a normal mood and affect.          Assessment & Plan:

## 2013-05-17 NOTE — Assessment & Plan Note (Signed)
BP ok today.  Continue meds.  Check CMP.

## 2013-05-17 NOTE — Assessment & Plan Note (Signed)
Has seen optho in last 1 year.  BS are well controlled.  HgbA1C is 6.3 today.

## 2013-05-17 NOTE — Patient Instructions (Signed)

## 2013-05-17 NOTE — Assessment & Plan Note (Signed)
Recheck lipids in next few weeks.

## 2013-06-08 ENCOUNTER — Ambulatory Visit (INDEPENDENT_AMBULATORY_CARE_PROVIDER_SITE_OTHER): Payer: Medicare HMO

## 2013-06-08 DIAGNOSIS — E785 Hyperlipidemia, unspecified: Secondary | ICD-10-CM

## 2013-06-08 DIAGNOSIS — I2510 Atherosclerotic heart disease of native coronary artery without angina pectoris: Secondary | ICD-10-CM

## 2013-06-08 DIAGNOSIS — I10 Essential (primary) hypertension: Secondary | ICD-10-CM

## 2013-06-08 DIAGNOSIS — I359 Nonrheumatic aortic valve disorder, unspecified: Secondary | ICD-10-CM

## 2013-06-08 DIAGNOSIS — I4891 Unspecified atrial fibrillation: Secondary | ICD-10-CM

## 2013-06-08 LAB — POCT INR: INR: 1.6

## 2013-07-01 ENCOUNTER — Encounter (HOSPITAL_COMMUNITY): Payer: Self-pay | Admitting: Emergency Medicine

## 2013-07-01 ENCOUNTER — Emergency Department (HOSPITAL_COMMUNITY)
Admission: EM | Admit: 2013-07-01 | Discharge: 2013-07-02 | Disposition: A | Discharge status: Home / Self Care | Payer: Medicare HMO | Attending: Emergency Medicine | Admitting: Emergency Medicine

## 2013-07-01 ENCOUNTER — Emergency Department (HOSPITAL_COMMUNITY): Payer: Medicare HMO

## 2013-07-01 DIAGNOSIS — I509 Heart failure, unspecified: Secondary | ICD-10-CM | POA: Insufficient documentation

## 2013-07-01 DIAGNOSIS — Z87891 Personal history of nicotine dependence: Secondary | ICD-10-CM | POA: Insufficient documentation

## 2013-07-01 DIAGNOSIS — M25579 Pain in unspecified ankle and joints of unspecified foot: Secondary | ICD-10-CM | POA: Insufficient documentation

## 2013-07-01 DIAGNOSIS — E785 Hyperlipidemia, unspecified: Secondary | ICD-10-CM | POA: Insufficient documentation

## 2013-07-01 DIAGNOSIS — Z8659 Personal history of other mental and behavioral disorders: Secondary | ICD-10-CM | POA: Insufficient documentation

## 2013-07-01 DIAGNOSIS — I2510 Atherosclerotic heart disease of native coronary artery without angina pectoris: Secondary | ICD-10-CM | POA: Insufficient documentation

## 2013-07-01 DIAGNOSIS — M25473 Effusion, unspecified ankle: Secondary | ICD-10-CM | POA: Insufficient documentation

## 2013-07-01 DIAGNOSIS — Z951 Presence of aortocoronary bypass graft: Secondary | ICD-10-CM | POA: Insufficient documentation

## 2013-07-01 DIAGNOSIS — Z9861 Coronary angioplasty status: Secondary | ICD-10-CM | POA: Insufficient documentation

## 2013-07-01 DIAGNOSIS — E119 Type 2 diabetes mellitus without complications: Secondary | ICD-10-CM | POA: Insufficient documentation

## 2013-07-01 DIAGNOSIS — M25476 Effusion, unspecified foot: Secondary | ICD-10-CM | POA: Insufficient documentation

## 2013-07-01 DIAGNOSIS — M25571 Pain in right ankle and joints of right foot: Secondary | ICD-10-CM

## 2013-07-01 DIAGNOSIS — Z79899 Other long term (current) drug therapy: Secondary | ICD-10-CM | POA: Insufficient documentation

## 2013-07-01 DIAGNOSIS — K219 Gastro-esophageal reflux disease without esophagitis: Secondary | ICD-10-CM | POA: Insufficient documentation

## 2013-07-01 DIAGNOSIS — Z7901 Long term (current) use of anticoagulants: Secondary | ICD-10-CM | POA: Insufficient documentation

## 2013-07-01 DIAGNOSIS — I10 Essential (primary) hypertension: Secondary | ICD-10-CM | POA: Insufficient documentation

## 2013-07-01 DIAGNOSIS — I4891 Unspecified atrial fibrillation: Secondary | ICD-10-CM | POA: Insufficient documentation

## 2013-07-01 DIAGNOSIS — IMO0002 Reserved for concepts with insufficient information to code with codable children: Secondary | ICD-10-CM | POA: Insufficient documentation

## 2013-07-01 LAB — PROTIME-INR
INR: 2.53 — ABNORMAL HIGH (ref 0.00–1.49)
Prothrombin Time: 26.4 seconds — ABNORMAL HIGH (ref 11.6–15.2)

## 2013-07-01 LAB — GLUCOSE, CAPILLARY: Glucose-Capillary: 104 mg/dL — ABNORMAL HIGH (ref 70–99)

## 2013-07-01 MED ORDER — OXYCODONE-ACETAMINOPHEN 5-325 MG PO TABS
1.0000 | ORAL_TABLET | Freq: Once | ORAL | Status: AC
  Administered 2013-07-01: 1 via ORAL
  Filled 2013-07-01: qty 1

## 2013-07-01 NOTE — ED Notes (Signed)
Pt able to wiggle digits. Skin dry and color normal for race

## 2013-07-01 NOTE — ED Notes (Addendum)
Pt c/o Rt sided ankle pain since waking up this morning. States pain is sharp and he cannot walk on it. Denies trauma.

## 2013-07-01 NOTE — ED Provider Notes (Signed)
CSN: [id number]     Arrival date & time 07/01/13  2037 History   First MD Initiated Contact with Patient 07/01/13 2259     Chief Complaint  Patient presents with  . Ankle Pain   (Consider location/radiation/quality/duration/timing/severity/associated sxs/prior Treatment) Patient is a 73 y.o. male presenting with ankle pain. The history is provided by the patient.  Ankle Pain He is having severe pain in his right ankle which started when he woke up this morning at about 3 AM. He rates pain at 9/10. It is worse with walking but nothing makes it better. There is no radiation of pain. He is also noted some swelling of his foot. He denies any trauma. He specifically denies falling or twisting the ankle. He has never had pain like this before. Of note, he is on warfarin for atrial fibrillation.  Past Medical History  Diagnosis Date  . Diabetes mellitus   . Depression   . Hyperlipidemia   . Hypertension   . CAD (coronary artery disease) 2012    Lima-LAD  . GERD (gastroesophageal reflux disease)   . S/P AVR     #25 mm Edwards pericardial valve  . Atrial fibrillation   . HEART FAILURE, CONGESTIVE UNSPEC 02/23/2007    Qualifier: Diagnosis of  By: Mellody Drown MD, [facility]    . TOBACCO DEPENDENCE 07/29/2006    Qualifier: History of  By: Carlena Sax  MD, Colletta [place]     Past Surgical History  Procedure Laterality Date  . Coronary artery bypass graft    . Aortic valve replacement    . Coronary angioplasty with stent placement     Family History  Problem Relation Age of Onset  . Heart disease Mother    History  Substance Use Topics  . Smoking status: Former Smoker    Quit date: 07/08/2004  . Smokeless tobacco: Never Used  . Alcohol Use: No    Review of Systems  All other systems reviewed and are negative.    Allergies  Pineapple concentrate  Home Medications   Current Outpatient Rx  Name  Route  Sig  Dispense  Refill  . atorvastatin (LIPITOR) 20 MG tablet   Oral   Take 1 tablet (20 mg  total) by mouth daily.   30 tablet   11   . benazepril (LOTENSIN) 20 MG tablet   Oral   Take 1 tablet (20 mg total) by mouth daily.   30 tablet   11   . budesonide-formoterol (SYMBICORT) 80-4.5 MCG/ACT inhaler   Inhalation   Inhale 2 puffs into the lungs 2 (two) times daily.   1 Inhaler   2   . cholecalciferol (VITAMIN D) 1000 UNITS tablet   Oral   Take 1,000 Units by mouth daily.         . famotidine (PEPCID) 20 MG tablet   Oral   Take 20 mg by mouth 2 (two) times daily as needed. For acid reflux         . fluticasone (FLOVENT HFA) 110 MCG/ACT inhaler   Inhalation   Inhale 2 puffs into the lungs 2 (two) times daily.   1 Inhaler   12   . furosemide (LASIX) 40 MG tablet      Take one tablet by mouth one time daily   30 tablet   3   . glipiZIDE (GLUCOTROL) 10 MG tablet   Oral   Take 1 tablet (10 mg total) by mouth daily.   90 tablet   3   . nitroGLYCERIN (  NITROSTAT) 0.4 MG SL tablet   Sublingual   Place 1 tablet (0.4 mg total) under the tongue every 5 (five) minutes as needed. For chest pain   30 tablet   3   . potassium chloride (K-DUR) 10 MEQ tablet   Oral   Take 2 tablets (20 mEq total) by mouth daily.   60 tablet   11   . salmeterol (SEREVENT) 50 MCG/DOSE diskus inhaler   Inhalation   Inhale 1 puff into the lungs 2 (two) times daily.   1 Inhaler   12   . vitamin C (ASCORBIC ACID) 500 MG tablet   Oral   Take 500 mg by mouth daily.           Marland Kitchen warfarin (COUMADIN) 5 MG tablet      Take as directed by coumadin clinic   150 tablet   0     This is 3 months supply    BP 159/126  Pulse 82  Temp(Src) 98.8 F (37.1 C) (Oral)  Resp 18  SpO2 97% Physical Exam  Nursing note and vitals reviewed.  73 year old male, resting comfortably and in no acute distress. Vital signs are significant for hypertension with blood pressure 159/126. Oxygen saturation is 97%, which is normal. Head is normocephalic and atraumatic. PERRLA, EOMI. Oropharynx is  clear. Neck is nontender and supple without adenopathy or JVD. Back is nontender and there is no CVA tenderness. Lungs are clear without rales, wheezes, or rhonchi. Chest is nontender. Heart has regular rate and rhythm without murmur. Abdomen is soft, flat, nontender without masses or hepatosplenomegaly and peristalsis is normoactive. Extremities: There is mild to moderate swelling around the right ankle with 2+ pedal edema the right foot. There is trace pretibial edema bilaterally. There is pain with passive dorsiflexion and plantarflexion of the right foot and a suggestion of a small effusion. Distal neurovascular exam is intact with strong pulses, prompt capillary refill, normal sensation.. Skin is warm and dry without rash. Neurologic: Mental status is normal, cranial nerves are intact, there are no motor or sensory deficits.  ED Course  Procedures (including critical care time) Labs Review Results for orders placed during the hospital encounter of 07/01/13  PROTIME-INR      Result Value Range   Prothrombin Time 26.4 (*) 11.6 - 15.2 seconds   INR 2.53 (*) 0.00 - 1.49  GLUCOSE, CAPILLARY      Result Value Range   Glucose-Capillary 104 (*) 70 - 99 mg/dL   Imaging Review Dg Ankle Complete Right  07/01/2013   CLINICAL DATA:  Right ankle pain and swelling laterally no known injury  EXAM: RIGHT ANKLE - COMPLETE 3+ VIEW  COMPARISON:  None.  FINDINGS: No fracture dislocation or joint effusion. Talonavicular arthritis with osteophyte noted. No significant soft tissue abnormalities appreciated.  IMPRESSION: No acute findings   Electronically Signed   By: Skipper Cliche M.D.   On: 07/01/2013 21:26   MDM   1. Pain in right ankle    Right ankle pain a without evidence of trauma. I am suspicious for possible prolonged spontaneous hemarthrosis. INR will be checked. Old records are viewed and last INR was on January 8 and was 1.6 and he states that there was no change in his warfarin dose at that  time.  INR is in the middle of the therapeutic range. He got good relief of pain with a dose of oxycodone acetaminophen. He is discharged with prescription for same and is referred to orthopedics  for followup. He is to continue taking his warfarin.  Delora Fuel, [place] [id number]

## 2013-07-02 MED ORDER — OXYCODONE-ACETAMINOPHEN 5-325 MG PO TABS: 1.0000 | ORAL_TABLET | ORAL | Status: DC | PRN

## 2013-07-02 NOTE — Discharge Instructions (Signed)
I suspect your pain may be due to some bleeding into your ankle joint. This is a complication of the blood thinner you are on. Please continue taking the blood thinner unless your heart doctor tells you to stop it. Make an appointment with the orthopedic doctor for further evaluation. In the meean time, apply ice and keep the foot elevated.  Acetaminophen; Oxycodone tablets What is this medicine? ACETAMINOPHEN; OXYCODONE (a set a MEE noe fen; ox i KOE done) is a pain reliever. It is used to treat mild to moderate pain. This medicine may be used for other purposes; ask your health care provider or pharmacist if you have questions. COMMON BRAND NAME(S): Endocet, Magnacet, Narvox, Percocet, Perloxx, Primalev, Primlev, Roxicet, Xolox What should I tell my health care provider before I take this medicine? They need to know if you have any of these conditions: -brain tumor -Crohn's disease, inflammatory bowel disease, or ulcerative colitis -drug abuse or addiction -head injury -heart or circulation problems -if you often drink alcohol -kidney disease or problems going to the bathroom -liver disease -lung disease, asthma, or breathing problems -an unusual or allergic reaction to acetaminophen, oxycodone, other opioid analgesics, other medicines, foods, dyes, or preservatives -pregnant or trying to get pregnant -breast-feeding How should I use this medicine? Take this medicine by mouth with a full glass of water. Follow the directions on the prescription label. Take your medicine at regular intervals. Do not take your medicine more often than directed. Talk to your pediatrician regarding the use of this medicine in children. Special care may be needed. Patients over 36 years old may have a stronger reaction and need a smaller dose. Overdosage: If you think you have taken too much of this medicine contact a poison control center or emergency room at once. NOTE: This medicine is only for you. Do not  share this medicine with others. What if I miss a dose? If you miss a dose, take it as soon as you can. If it is almost time for your next dose, take only that dose. Do not take double or extra doses. What may interact with this medicine? -alcohol -antihistamines -barbiturates like amobarbital, butalbital, butabarbital, methohexital, pentobarbital, phenobarbital, thiopental, and secobarbital -benztropine -drugs for bladder problems like solifenacin, trospium, oxybutynin, tolterodine, hyoscyamine, and methscopolamine -drugs for breathing problems like ipratropium and tiotropium -drugs for certain stomach or intestine problems like propantheline, homatropine methylbromide, glycopyrrolate, atropine, belladonna, and dicyclomine -general anesthetics like etomidate, ketamine, nitrous oxide, propofol, desflurane, enflurane, halothane, isoflurane, and sevoflurane -medicines for depression, anxiety, or psychotic disturbances -medicines for sleep -muscle relaxants -naltrexone -narcotic medicines (opiates) for pain -phenothiazines like perphenazine, thioridazine, chlorpromazine, mesoridazine, fluphenazine, prochlorperazine, promazine, and trifluoperazine -scopolamine -tramadol -trihexyphenidyl This list may not describe all possible interactions. Give your health care provider a list of all the medicines, herbs, non-prescription drugs, or dietary supplements you use. Also tell them if you smoke, drink alcohol, or use illegal drugs. Some items may interact with your medicine. What should I watch for while using this medicine? Tell your doctor or health care professional if your pain does not go away, if it gets worse, or if you have new or a different type of pain. You may develop tolerance to the medicine. Tolerance means that you will need a higher dose of the medication for pain relief. Tolerance is normal and is expected if you take this medicine for a long time. Do not suddenly stop taking your  medicine because you may develop a severe reaction. Your body  becomes used to the medicine. This does NOT mean you are addicted. Addiction is a behavior related to getting and using a drug for a non-medical reason. If you have pain, you have a medical reason to take pain medicine. Your doctor will tell you how much medicine to take. If your doctor wants you to stop the medicine, the dose will be slowly lowered over time to avoid any side effects. You may get drowsy or dizzy. Do not drive, use machinery, or do anything that needs mental alertness until you know how this medicine affects you. Do not stand or sit up quickly, especially if you are an older patient. This reduces the risk of dizzy or fainting spells. Alcohol may interfere with the effect of this medicine. Avoid alcoholic drinks. There are different types of narcotic medicines (opiates) for pain. If you take more than one type at the same time, you may have more side effects. Give your health care provider a list of all medicines you use. Your doctor will tell you how much medicine to take. Do not take more medicine than directed. Call emergency for help if you have problems breathing. The medicine will cause constipation. Try to have a bowel movement at least every 2 to 3 days. If you do not have a bowel movement for 3 days, call your doctor or health care professional. Do not take Tylenol (acetaminophen) or medicines that have acetaminophen with this medicine. Too much acetaminophen can be very dangerous. Many nonprescription medicines contain acetaminophen. Always read the labels carefully to avoid taking more acetaminophen. What side effects may I notice from receiving this medicine? Side effects that you should report to your doctor or health care professional as soon as possible: -allergic reactions like skin rash, itching or hives, swelling of the face, lips, or tongue -breathing difficulties, wheezing -confusion -light headedness or  fainting spells -severe stomach pain -unusually weak or tired -yellowing of the skin or the whites of the eyes  Side effects that usually do not require medical attention (report to your doctor or health care professional if they continue or are bothersome): -dizziness -drowsiness -nausea -vomiting This list may not describe all possible side effects. Call your doctor for medical advice about side effects. You may report side effects to FDA at 1-800-FDA-1088. Where should I keep my medicine? Keep out of the reach of children. This medicine can be abused. Keep your medicine in a safe place to protect it from theft. Do not share this medicine with anyone. Selling or giving away this medicine is dangerous and against the law. Store at room temperature between 20 and 25 degrees C (68 and 77 degrees F). Keep container tightly closed. Protect from light. This medicine may cause accidental overdose and death if it is taken by other adults, children, or pets. Flush any unused medicine down the toilet to reduce the chance of harm. Do not use the medicine after the expiration date. NOTE: This sheet is a summary. It may not cover all possible information. If you have questions about this medicine, talk to your doctor, pharmacist, or health care provider.  2014, [employer]vier/[employer] Standard. (2013-01-09 13:17:35)

## 2013-07-02 NOTE — ED Notes (Signed)
Introduced self to pt.  Does not appear in distress; pt A&O x 4, right ankle swollen bilaterally and onto dorsum of foot.  + pedal pulse.  Denies injury.  Unable to bear wt on it today all day due to pain.

## 2013-07-10 ENCOUNTER — Ambulatory Visit (INDEPENDENT_AMBULATORY_CARE_PROVIDER_SITE_OTHER): Payer: Medicare HMO | Admitting: *Deleted

## 2013-07-10 ENCOUNTER — Encounter: Payer: Self-pay | Admitting: Cardiology

## 2013-07-10 ENCOUNTER — Ambulatory Visit (INDEPENDENT_AMBULATORY_CARE_PROVIDER_SITE_OTHER): Payer: Medicare HMO | Admitting: Cardiology

## 2013-07-10 VITALS — BP 136/62 | HR 74 | Ht 69.0 in | Wt 252.8 lb

## 2013-07-10 DIAGNOSIS — I2510 Atherosclerotic heart disease of native coronary artery without angina pectoris: Secondary | ICD-10-CM

## 2013-07-10 DIAGNOSIS — I10 Essential (primary) hypertension: Secondary | ICD-10-CM

## 2013-07-10 DIAGNOSIS — Z954 Presence of other heart-valve replacement: Secondary | ICD-10-CM

## 2013-07-10 DIAGNOSIS — E785 Hyperlipidemia, unspecified: Secondary | ICD-10-CM

## 2013-07-10 DIAGNOSIS — I4891 Unspecified atrial fibrillation: Secondary | ICD-10-CM

## 2013-07-10 DIAGNOSIS — I359 Nonrheumatic aortic valve disorder, unspecified: Secondary | ICD-10-CM

## 2013-07-10 DIAGNOSIS — Z952 Presence of prosthetic heart valve: Secondary | ICD-10-CM

## 2013-07-10 LAB — CBC WITH DIFFERENTIAL/PLATELET
Basophils Absolute: 0 10*3/uL (ref 0.0–0.1)
Basophils Relative: 0.2 % (ref 0.0–3.0)
Eosinophils Absolute: 0 10*3/uL (ref 0.0–0.7)
Eosinophils Relative: 0.7 % (ref 0.0–5.0)
HCT: 45.7 % (ref 39.0–52.0)
Hemoglobin: 14.7 g/dL (ref 13.0–17.0)
Lymphocytes Relative: 26.5 % (ref 12.0–46.0)
Lymphs Abs: 1.8 10*3/uL (ref 0.7–4.0)
MCHC: 32.1 g/dL (ref 30.0–36.0)
MCV: 90.4 fl (ref 78.0–100.0)
Monocytes Absolute: 0.4 10*3/uL (ref 0.1–1.0)
Monocytes Relative: 6.6 % (ref 3.0–12.0)
Neutro Abs: 4.4 10*3/uL (ref 1.4–7.7)
Neutrophils Relative %: 66 % (ref 43.0–77.0)
Platelets: 146 10*3/uL — ABNORMAL LOW (ref 150.0–400.0)
RBC: 5.06 Mil/uL (ref 4.22–5.81)
RDW: 14.6 % (ref 11.5–14.6)
WBC: 6.6 10*3/uL (ref 4.5–10.5)

## 2013-07-10 LAB — BASIC METABOLIC PANEL
BUN: 13 mg/dL (ref 6–23)
CO2: 26 mEq/L (ref 19–32)
Calcium: 9.8 mg/dL (ref 8.4–10.5)
Chloride: 105 mEq/L (ref 96–112)
Creatinine, Ser: 1.1 mg/dL (ref 0.4–1.5)
GFR: 88.76 mL/min (ref >60.00)
Glucose, Bld: 82 mg/dL (ref 70–99)
Potassium: 3.9 mEq/L (ref 3.5–5.1)
Sodium: 140 mEq/L (ref 135–145)

## 2013-07-10 LAB — POCT INR: INR: 2.1

## 2013-07-10 MED ORDER — APIXABAN 5 MG PO TABS: 5.0000 mg | ORAL_TABLET | Freq: Two times a day (BID) | ORAL | Status: DC

## 2013-07-10 NOTE — Progress Notes (Addendum)
Matthew Edwards Date of Birth: Jul 05, 1940 Medical Record [id number]  History of Present Illness: [name] is seen for followup today. He has a history of coronary disease with prior stenting of the LAD and left circumflex coronary disease in the past. In 2012 he underwent open heart surgery with an LIMA graft to the LAD and aortic valve replacement with a #25 mm Edwards pericardial valve. He has a history of diabetes, hypertension, and hyperlipidemia. He also has a history of atrial fibrillation and is on coumadin. He has done very well from a cardiac standpoint. He denies any chest pain, dyspnea, dizziness, or palpitations. He was see in the emergency room one week ago with acute pain and swelling in his right ankle. XRay was negative. INR was 2.5. He was treated with oxycodone with improvement. There was a ? Of hemarthropathy. The patient doesn't like taking coumadin due to high copay for INR visits ($40) and number of follow up visits.  Current Outpatient Prescriptions on File Prior to Visit  Medication Sig Dispense Refill  . atorvastatin (LIPITOR) 20 MG tablet Take 20 mg by mouth at bedtime.      . benazepril (LOTENSIN) 20 MG tablet Take 1 tablet (20 mg total) by mouth daily.  30 tablet  11  . budesonide-formoterol (SYMBICORT) 80-4.5 MCG/ACT inhaler Inhale 2 puffs into the lungs 2 (two) times daily.  1 Inhaler  2  . cholecalciferol (VITAMIN D) 1000 UNITS tablet Take 1,000 Units by mouth daily.      . furosemide (LASIX) 40 MG tablet Take 40 mg by mouth daily.      Marland Kitchen glipiZIDE (GLUCOTROL) 10 MG tablet Take 10 mg by mouth at bedtime.      . nitroGLYCERIN (NITROSTAT) 0.4 MG SL tablet Place 1 tablet (0.4 mg total) under the tongue every 5 (five) minutes as needed. For chest pain  30 tablet  3  . oxyCODONE-acetaminophen (PERCOCET) 5-325 MG per tablet Take 1 tablet by mouth every 4 (four) hours as needed.  20 tablet  0  . potassium chloride SA (K-DUR,KLOR-CON) 20 MEQ tablet Take 20 mEq by mouth daily.       . vitamin C (ASCORBIC ACID) 500 MG tablet Take 500 mg by mouth at bedtime.       No current facility-administered medications on file prior to visit.    Allergies  Allergen Reactions  . Pineapple Concentrate Nausea And Vomiting    Past Medical History  Diagnosis Date  . Diabetes mellitus   . Depression   . Hyperlipidemia   . Hypertension   . CAD (coronary artery disease) 2012    [place]-LAD  . GERD (gastroesophageal reflux disease)   . S/P AVR     #25 mm Edwards pericardial valve  . Atrial fibrillation   . HEART FAILURE, CONGESTIVE UNSPEC 02/23/2007    Qualifier: Diagnosis of  By: Mellody Drown MD, [facility]    . TOBACCO DEPENDENCE 07/29/2006    Qualifier: History of  By: Carlena Sax  MD, [facility] [place]      Past Surgical History  Procedure Laterality Date  . Coronary artery bypass graft    . Aortic valve replacement    . Coronary angioplasty with stent placement      History  Smoking status  . Former Smoker  . Quit date: 07/08/2004  Smokeless tobacco  . Never Used    History  Alcohol Use No    Family History  Problem Relation Age of Onset  . Heart disease Mother     Review  of Systems: As noted in history of present illness. All other systems were reviewed and are negative.  Physical Exam: BP 136/62  Pulse 74  Ht 5\' 9"  (1.753 m)  Wt 252 lb 12.8 oz (114.669 kg)  BMI 37.31 kg/m2 He is a pleasant black male in no acute distress. The HEENT exam is normal. The carotids are 2+ without bruits.  There is no thyromegaly.  There is no JVD.  The lungs are clear.   His median sternotomy scar has healed well. The heart exam reveals an irregular rate and rhythm  with a normal S1 and S2.  There is a soft systolic ejection sound at the right upper sternal border.  The PMI is not displaced.   Abdominal exam reveals good bowel sounds.  There is no hepatosplenomegaly or tenderness.  There are no masses.  Exam of the legs reveal no clubbing, cyanosis, or edema. The right ankle is not swollen  and has full range of motion. The distal pulses are intact.  Cranial nerves II - XII are intact.  Motor and sensory functions are intact.  The gait is normal.  LABORATORY DATA:  Lab Results  Component Value Date   WBC 6.3 12/23/2012   HGB 16.8 12/23/2012   HCT 49.4 12/23/2012   PLT 132* 12/23/2012   GLUCOSE 112* 12/23/2012   CHOL 106 02/19/2012   TRIG 47.0 02/19/2012   HDL 36.90* 02/19/2012   LDLDIRECT 81 12/23/2012   LDLCALC 60 02/19/2012   ALT 18 12/23/2012   AST 20 12/23/2012   NA 139 12/23/2012   K 4.1 12/23/2012   CL 102 12/23/2012   CREATININE 1.22 12/23/2012   BUN 14 12/23/2012   CO2 30 12/23/2012   TSH 0.71 10/11/2012   PSA 0.34 05/27/2009   INR 2.1 07/10/2013   HGBA1C 6.3 05/17/2013   MICROALBUR 0.50 11/25/2011   Ecg: atrial fibrillation with rate 76 bpm. Otherwise normal.  Assessment / Plan: 1. Coronary disease status post remote stenting of the LAD and left circumflex. Status post CABG in 2012 with an LIMA graft to the LAD. He is asymptomatic. Continue risk factor modification.  2. Hypertension-blood pressure is controlled.  3. Aortic stenosis status post aortic valve replacement with a pericardial valve. Postoperative echocardiogram in May of 2012 showed normal valve function.  4. Hyperlipidemia. He is on Lipitor. Last lipid levels were satisfactory.  5. Diabetes mellitus type 2.   6. Atrial fibrillation-permanent. Rate is well controlled and the patient is asymptomatic. He has a [place] score of 2. I have recommended long-term anticoagulation. Will plan on switching him to a newer anticoagulant which should result in cost saving. Will direct our coumadin clinic to make the transition.

## 2013-07-10 NOTE — Patient Instructions (Signed)
We will try and switch you to a newer anticoagulant.   Continue your other therapy  I will see you in 6 months.

## 2013-08-01 ENCOUNTER — Telehealth: Payer: Self-pay | Admitting: *Deleted

## 2013-08-01 NOTE — Telephone Encounter (Signed)
PA to [place] for eliquis

## 2013-08-03 ENCOUNTER — Other Ambulatory Visit: Payer: Self-pay | Admitting: Family Medicine

## 2013-08-03 ENCOUNTER — Telehealth: Payer: Self-pay | Admitting: *Deleted

## 2013-08-03 NOTE — Telephone Encounter (Signed)
[employer] formulary doesn't include eliquis but does xarelto and pradaxa, will send note to Dr Martinique.

## 2013-08-03 NOTE — Telephone Encounter (Signed)
Will forward to coumadin clinic, he has appointment 08/11/2013, spoke with patient and informed him of change, he states his dtr might want him to go back on coumadin, please discuss with patient and daughter at coumadin clinic visit. I have not ordered or start xarelto.

## 2013-08-03 NOTE — Telephone Encounter (Signed)
Used separate form for eliquis formulary exception faxed to [place]

## 2013-08-03 NOTE — Telephone Encounter (Signed)
Message copied by Fernande Boyden on Thu Aug 03, 2013  5:40 PM ------      Message from: Martinique, PETER M      Created: Thu Aug 03, 2013  5:20 PM      Regarding: RE: [employer] formulary       Yes I would switch to Xarelto            Peter Martinique MD      ----- Message -----         From: Fernande Boyden, RN         Sent: 08/03/2013   5:05 PM           To: Peter M Martinique, MD, Fernande Boyden, RN, #      Subject: [place] formulary                                          Dr Martinique,       [place] doesn't cover eliquis but does xarelto and pradaxa, can we use alternative?      Thanks, Lovett Sox, RN Patient Care Advocate       ------

## 2013-08-11 ENCOUNTER — Ambulatory Visit (INDEPENDENT_AMBULATORY_CARE_PROVIDER_SITE_OTHER): Payer: Medicare HMO | Admitting: Pharmacist

## 2013-08-11 DIAGNOSIS — I359 Nonrheumatic aortic valve disorder, unspecified: Secondary | ICD-10-CM

## 2013-08-11 DIAGNOSIS — I4891 Unspecified atrial fibrillation: Secondary | ICD-10-CM

## 2013-09-01 ENCOUNTER — Ambulatory Visit (INDEPENDENT_AMBULATORY_CARE_PROVIDER_SITE_OTHER): Payer: Medicare HMO | Admitting: *Deleted

## 2013-09-01 DIAGNOSIS — I4891 Unspecified atrial fibrillation: Secondary | ICD-10-CM

## 2013-09-01 DIAGNOSIS — I359 Nonrheumatic aortic valve disorder, unspecified: Secondary | ICD-10-CM

## 2013-09-01 DIAGNOSIS — I2510 Atherosclerotic heart disease of native coronary artery without angina pectoris: Secondary | ICD-10-CM

## 2013-09-01 DIAGNOSIS — E785 Hyperlipidemia, unspecified: Secondary | ICD-10-CM

## 2013-09-01 DIAGNOSIS — I10 Essential (primary) hypertension: Secondary | ICD-10-CM

## 2013-09-01 LAB — POCT INR: INR: 1.7

## 2013-09-01 MED ORDER — WARFARIN SODIUM 5 MG PO TABS: 5.0000 mg | ORAL_TABLET | ORAL | Status: DC

## 2013-09-08 ENCOUNTER — Ambulatory Visit (INDEPENDENT_AMBULATORY_CARE_PROVIDER_SITE_OTHER): Payer: Medicare HMO | Admitting: Pharmacist

## 2013-09-08 DIAGNOSIS — I4891 Unspecified atrial fibrillation: Secondary | ICD-10-CM

## 2013-09-08 DIAGNOSIS — E785 Hyperlipidemia, unspecified: Secondary | ICD-10-CM

## 2013-09-08 DIAGNOSIS — I10 Essential (primary) hypertension: Secondary | ICD-10-CM

## 2013-09-08 DIAGNOSIS — I359 Nonrheumatic aortic valve disorder, unspecified: Secondary | ICD-10-CM

## 2013-09-08 DIAGNOSIS — I2510 Atherosclerotic heart disease of native coronary artery without angina pectoris: Secondary | ICD-10-CM

## 2013-09-08 LAB — POCT INR: INR: 3.2

## 2013-09-15 ENCOUNTER — Ambulatory Visit (INDEPENDENT_AMBULATORY_CARE_PROVIDER_SITE_OTHER): Payer: Medicare HMO | Admitting: *Deleted

## 2013-09-15 DIAGNOSIS — I4891 Unspecified atrial fibrillation: Secondary | ICD-10-CM

## 2013-09-15 DIAGNOSIS — Z5181 Encounter for therapeutic drug level monitoring: Secondary | ICD-10-CM

## 2013-09-15 DIAGNOSIS — I2510 Atherosclerotic heart disease of native coronary artery without angina pectoris: Secondary | ICD-10-CM

## 2013-09-15 DIAGNOSIS — E785 Hyperlipidemia, unspecified: Secondary | ICD-10-CM

## 2013-09-15 DIAGNOSIS — I10 Essential (primary) hypertension: Secondary | ICD-10-CM

## 2013-09-15 DIAGNOSIS — I359 Nonrheumatic aortic valve disorder, unspecified: Secondary | ICD-10-CM

## 2013-09-15 LAB — POCT INR: INR: 2.5

## 2013-09-18 ENCOUNTER — Other Ambulatory Visit: Payer: Self-pay | Admitting: *Deleted

## 2013-09-18 DIAGNOSIS — M25569 Pain in unspecified knee: Secondary | ICD-10-CM

## 2013-09-18 MED ORDER — OXYCODONE-ACETAMINOPHEN 5-325 MG PO TABS
1.0000 | ORAL_TABLET | ORAL | Status: DC | PRN
Start: 2013-09-18 — End: 2014-01-26

## 2013-09-18 NOTE — Telephone Encounter (Signed)
Pt in office today requesting refill on pain medication.  Pt complained of left knee pain.  Pt advised to schedule appt for the pain.  Derl Barrow, RN

## 2013-09-29 ENCOUNTER — Ambulatory Visit (INDEPENDENT_AMBULATORY_CARE_PROVIDER_SITE_OTHER): Payer: Commercial Managed Care - HMO

## 2013-09-29 DIAGNOSIS — I359 Nonrheumatic aortic valve disorder, unspecified: Secondary | ICD-10-CM

## 2013-09-29 DIAGNOSIS — I4891 Unspecified atrial fibrillation: Secondary | ICD-10-CM

## 2013-09-29 DIAGNOSIS — I10 Essential (primary) hypertension: Secondary | ICD-10-CM

## 2013-09-29 DIAGNOSIS — E785 Hyperlipidemia, unspecified: Secondary | ICD-10-CM

## 2013-09-29 DIAGNOSIS — Z5181 Encounter for therapeutic drug level monitoring: Secondary | ICD-10-CM

## 2013-09-29 DIAGNOSIS — I2510 Atherosclerotic heart disease of native coronary artery without angina pectoris: Secondary | ICD-10-CM

## 2013-09-29 LAB — POCT INR: INR: 2.9

## 2013-10-20 ENCOUNTER — Ambulatory Visit (INDEPENDENT_AMBULATORY_CARE_PROVIDER_SITE_OTHER): Payer: Medicare Other | Admitting: *Deleted

## 2013-10-20 DIAGNOSIS — I2510 Atherosclerotic heart disease of native coronary artery without angina pectoris: Secondary | ICD-10-CM

## 2013-10-20 DIAGNOSIS — E785 Hyperlipidemia, unspecified: Secondary | ICD-10-CM

## 2013-10-20 DIAGNOSIS — Z5181 Encounter for therapeutic drug level monitoring: Secondary | ICD-10-CM

## 2013-10-20 DIAGNOSIS — I10 Essential (primary) hypertension: Secondary | ICD-10-CM

## 2013-10-20 DIAGNOSIS — I4891 Unspecified atrial fibrillation: Secondary | ICD-10-CM

## 2013-10-20 DIAGNOSIS — I359 Nonrheumatic aortic valve disorder, unspecified: Secondary | ICD-10-CM

## 2013-10-20 LAB — POCT INR: INR: 1.3

## 2013-10-27 ENCOUNTER — Ambulatory Visit (INDEPENDENT_AMBULATORY_CARE_PROVIDER_SITE_OTHER): Payer: Medicare Other | Admitting: Surgery

## 2013-10-27 DIAGNOSIS — I2510 Atherosclerotic heart disease of native coronary artery without angina pectoris: Secondary | ICD-10-CM

## 2013-10-27 DIAGNOSIS — I4891 Unspecified atrial fibrillation: Secondary | ICD-10-CM

## 2013-10-27 DIAGNOSIS — Z5181 Encounter for therapeutic drug level monitoring: Secondary | ICD-10-CM

## 2013-10-27 DIAGNOSIS — I359 Nonrheumatic aortic valve disorder, unspecified: Secondary | ICD-10-CM

## 2013-10-27 DIAGNOSIS — I10 Essential (primary) hypertension: Secondary | ICD-10-CM

## 2013-10-27 DIAGNOSIS — E785 Hyperlipidemia, unspecified: Secondary | ICD-10-CM

## 2013-10-27 LAB — POCT INR: INR: 2.3

## 2013-11-10 ENCOUNTER — Ambulatory Visit (INDEPENDENT_AMBULATORY_CARE_PROVIDER_SITE_OTHER): Payer: Commercial Managed Care - HMO | Admitting: *Deleted

## 2013-11-10 DIAGNOSIS — I10 Essential (primary) hypertension: Secondary | ICD-10-CM

## 2013-11-10 DIAGNOSIS — E785 Hyperlipidemia, unspecified: Secondary | ICD-10-CM

## 2013-11-10 DIAGNOSIS — Z5181 Encounter for therapeutic drug level monitoring: Secondary | ICD-10-CM

## 2013-11-10 DIAGNOSIS — I4891 Unspecified atrial fibrillation: Secondary | ICD-10-CM

## 2013-11-10 DIAGNOSIS — I359 Nonrheumatic aortic valve disorder, unspecified: Secondary | ICD-10-CM

## 2013-11-10 DIAGNOSIS — I2510 Atherosclerotic heart disease of native coronary artery without angina pectoris: Secondary | ICD-10-CM

## 2013-11-10 LAB — POCT INR: INR: 1.4

## 2013-11-30 ENCOUNTER — Ambulatory Visit (INDEPENDENT_AMBULATORY_CARE_PROVIDER_SITE_OTHER): Payer: Commercial Managed Care - HMO | Admitting: Pharmacist

## 2013-11-30 DIAGNOSIS — Z5181 Encounter for therapeutic drug level monitoring: Secondary | ICD-10-CM

## 2013-11-30 DIAGNOSIS — I10 Essential (primary) hypertension: Secondary | ICD-10-CM

## 2013-11-30 DIAGNOSIS — I4891 Unspecified atrial fibrillation: Secondary | ICD-10-CM

## 2013-11-30 DIAGNOSIS — I2510 Atherosclerotic heart disease of native coronary artery without angina pectoris: Secondary | ICD-10-CM

## 2013-11-30 DIAGNOSIS — E785 Hyperlipidemia, unspecified: Secondary | ICD-10-CM

## 2013-11-30 DIAGNOSIS — I359 Nonrheumatic aortic valve disorder, unspecified: Secondary | ICD-10-CM

## 2013-11-30 LAB — POCT INR: INR: 1.5

## 2013-12-02 ENCOUNTER — Other Ambulatory Visit: Payer: Self-pay | Admitting: Family Medicine

## 2013-12-05 ENCOUNTER — Other Ambulatory Visit: Payer: Self-pay | Admitting: Family Medicine

## 2013-12-13 ENCOUNTER — Ambulatory Visit (INDEPENDENT_AMBULATORY_CARE_PROVIDER_SITE_OTHER): Payer: Commercial Managed Care - HMO | Admitting: Family Medicine

## 2013-12-13 ENCOUNTER — Encounter: Payer: Self-pay | Admitting: Family Medicine

## 2013-12-13 VITALS — BP 131/65 | HR 114 | Temp 98.3°F | Wt 253.0 lb

## 2013-12-13 DIAGNOSIS — G909 Disorder of the autonomic nervous system, unspecified: Secondary | ICD-10-CM

## 2013-12-13 DIAGNOSIS — K5900 Constipation, unspecified: Secondary | ICD-10-CM | POA: Insufficient documentation

## 2013-12-13 DIAGNOSIS — J449 Chronic obstructive pulmonary disease, unspecified: Secondary | ICD-10-CM

## 2013-12-13 DIAGNOSIS — IMO0001 Reserved for inherently not codable concepts without codable children: Secondary | ICD-10-CM

## 2013-12-13 DIAGNOSIS — I4891 Unspecified atrial fibrillation: Secondary | ICD-10-CM

## 2013-12-13 DIAGNOSIS — E559 Vitamin D deficiency, unspecified: Secondary | ICD-10-CM

## 2013-12-13 DIAGNOSIS — E099 Drug or chemical induced diabetes mellitus without complications: Secondary | ICD-10-CM

## 2013-12-13 DIAGNOSIS — I482 Chronic atrial fibrillation, unspecified: Secondary | ICD-10-CM

## 2013-12-13 DIAGNOSIS — E0843 Diabetes mellitus due to underlying condition with diabetic autonomic (poly)neuropathy: Secondary | ICD-10-CM

## 2013-12-13 DIAGNOSIS — E139 Other specified diabetes mellitus without complications: Secondary | ICD-10-CM

## 2013-12-13 DIAGNOSIS — I10 Essential (primary) hypertension: Secondary | ICD-10-CM

## 2013-12-13 DIAGNOSIS — E785 Hyperlipidemia, unspecified: Secondary | ICD-10-CM

## 2013-12-13 DIAGNOSIS — E1349 Other specified diabetes mellitus with other diabetic neurological complication: Secondary | ICD-10-CM

## 2013-12-13 LAB — LIPID PANEL
Cholesterol: 117 mg/dL (ref 0–200)
HDL: 38 mg/dL — ABNORMAL LOW (ref >39)
LDL Cholesterol: 50 mg/dL (ref 0–99)
Total CHOL/HDL Ratio: 3.1 Ratio
Triglycerides: 147 mg/dL (ref <150)
VLDL: 29 mg/dL (ref 0–40)

## 2013-12-13 LAB — POCT GLYCOSYLATED HEMOGLOBIN (HGB A1C): Hemoglobin A1C: 7.5

## 2013-12-13 MED ORDER — GLIPIZIDE 10 MG PO TABS: 10.0000 mg | ORAL_TABLET | Freq: Every day | ORAL | Status: DC

## 2013-12-13 MED ORDER — BENAZEPRIL HCL 20 MG PO TABS: 20.0000 mg | ORAL_TABLET | Freq: Every day | ORAL | Status: DC

## 2013-12-13 MED ORDER — BUDESONIDE-FORMOTEROL FUMARATE 80-4.5 MCG/ACT IN AERO: 2.0000 | INHALATION_SPRAY | Freq: Two times a day (BID) | RESPIRATORY_TRACT | Status: DC

## 2013-12-13 MED ORDER — FUROSEMIDE 40 MG PO TABS: 40.0000 mg | ORAL_TABLET | Freq: Every day | ORAL | Status: DC

## 2013-12-13 MED ORDER — VITAMIN C 500 MG PO TABS: 500.0000 mg | ORAL_TABLET | Freq: Every day | ORAL | Status: DC

## 2013-12-13 MED ORDER — ATORVASTATIN CALCIUM 20 MG PO TABS: 20.0000 mg | ORAL_TABLET | Freq: Every day | ORAL | Status: DC

## 2013-12-13 MED ORDER — NITROGLYCERIN 0.4 MG SL SUBL: 0.4000 mg | SUBLINGUAL_TABLET | SUBLINGUAL | Status: DC | PRN

## 2013-12-13 MED ORDER — POTASSIUM CHLORIDE CRYS ER 20 MEQ PO TBCR: 20.0000 meq | EXTENDED_RELEASE_TABLET | Freq: Every day | ORAL | Status: DC

## 2013-12-13 MED ORDER — VITAMIN D3 25 MCG (1000 UNIT) PO TABS: 1000.0000 [IU] | ORAL_TABLET | Freq: Every day | ORAL | Status: DC

## 2013-12-13 NOTE — Assessment & Plan Note (Signed)
BP is well controlled 

## 2013-12-13 NOTE — Assessment & Plan Note (Signed)
Continue current meds 

## 2013-12-13 NOTE — Assessment & Plan Note (Signed)
Continue to work on glycemic control. Work on eliminating bread, substitute whole wheat.

## 2013-12-13 NOTE — Patient Instructions (Signed)
Exercise Guidelines During Cardiac Rehabilitation During cardiac rehabilitation, it is important to do more aerobic activities. Even simple lifestyle changes can help, such as parking farther from the store or taking the stairs instead of the elevator.  Discuss an appropriate exercise program with your cardiologist and rehabilitation therapist. It is important to design a program that is safe and effective for you. The program should meet your specific abilities and needs. Walking, biking, jogging, and swimming are all good aerobic activities. These take light to moderate effort. Adding some light resistance training is also important.  STRETCHING Stretching before you exercise warms up your muscles and prevents injury. Stretching also improves your flexibility, balance, coordination, and range of motion.  Stretch both before and after exercising.  Do not force a muscle or joint into a painful angle. Stretching should be a relaxing part of your exercise routine.  As soon as you feel resistance, hold the position and count to 10.  Go slowly when doing all stretches. TYPES OF EXERCISE Aerobic Exercise Aerobic exercise keeps joints and muscles moving. It involves large muscle groups. It is also rhythmic in nature and done for a longer period. Doing these exercises improves circulation and endurance. Examples of aerobic exercise include:  Swimming.  Walking.  Hiking.  Jogging.  Cross-country skiing.  Biking. Static Exercise Static exercise uses muscles at high intensities without moving the joints. Pushing against a heavy couch that does not move is an example of static exercise. Static exercise improves strength but also quickly increases blood pressure. Therefore, you need to follow these guidelines:  Do not hold your breath while doing static exercises. Holding your breath during static exercises can raise your blood pressure to a dangerously high level.  Do not do static exercises if  you have circulation problems or high blood pressure. Weight-Resistance Exercise Weight-resistance exercises are another important part of rehabilitation. These exercises strengthen your muscles by making them work against resistance. Examples include using free weights, weight-lifting machines, and large, specially designed rubber bands. Resistance exercises may help you return to activities of daily living sooner and improve your quality of life. They also help reduce cardiac risk factors. You will usually do weight-resistance exercises twice a week with a 2-day rest period between workouts. SETTING A PACE   Choose a pace that is comfortable for you. You should be able to talk with a friend while exercising. Slow down if you are short of breath or unable to speak while you exercise.  Keep track of how hard you are working as you exercise. Your rehabilitation therapist can teach you to use a scale to measure your level of exertion. Use this scale to make sure you are exercising at a level that is safe and effective for you. Cardiac rehabilitation and wellness facilities use numerical scales to rate your level of exertion. These scales usually rate your exertion level in a range from 6 to 20. A rating of 6 means that you are not exerting yourself at all. A rating of 20 indicates that you are working very hard. For a healthy exercise session, your exertion rate goal should be between 11 and 15. Document Released: 05/23/2013 Document Reviewed: 04/19/2013 [facility] Patient Information 2015 [place], [place]. This information is not intended to replace advice given to you by your health care provider. Make sure you discuss any questions you have with your health care provider.

## 2013-12-13 NOTE — Progress Notes (Signed)
    Subjective:    Patient ID: Matthew Edwards is a 73 y.o. male presenting with Diabetes  on 12/13/2013  HPI: Trial of Eloquis--had numerous problems with joint bleeding.  Now back on coumadin. Happier with this.  Having some constipation. Reportedly had normal colonoscopy 01/2008.  No blood in stools. Using stool softener to help him have a BM. Normal size and caliber. Reports good BS control  Brings log 2 months BS 109-166 but most are less than 150.  Review of Systems  Constitutional: Negative for fever and chills.  Respiratory: Negative for shortness of breath.   Cardiovascular: Negative for chest pain.  Gastrointestinal: Negative for abdominal pain.      Objective:    BP 131/65  Pulse 114  Temp(Src) 98.3 F (36.8 C) (Oral)  Wt 253 lb (114.76 kg) Physical Exam  Constitutional: He appears well-developed and well-nourished. No distress.  HENT:  Head: Normocephalic and atraumatic.  Neck: Neck supple.  Pulmonary/Chest: Effort normal.  Abdominal: Soft.        Assessment & Plan:   Problem List Items Addressed This Visit     Unprioritized   VITAMIN D DEFICIENCY   Relevant Medications      cholecalciferol (VITAMIN D) 1000 UNITS tablet      ascorbic acid (VITAMIN C) tablet   HYPERLIPIDEMIA     Check lipids    Relevant Medications      benazepril (LOTENSIN) tablet      atorvastatin (LIPITOR) tablet      furosemide (LASIX) tablet      nitroGLYCERIN (NITROSTAT) SL tablet   HYPERTENSION, ESSENTIAL NOS     BP is well controlled    Relevant Medications      benazepril (LOTENSIN) tablet      atorvastatin (LIPITOR) tablet      furosemide (LASIX) tablet      nitroGLYCERIN (NITROSTAT) SL tablet      potassium chloride SA (K-DUR,KLOR-CON) CR tablet   Diabetes mellitus - Primary     Continue to work on glycemic control. Work on eliminating bread, substitute whole wheat.    Relevant Medications      benazepril (LOTENSIN) tablet      atorvastatin (LIPITOR) tablet   glipiZIDE (GLUCOTROL) tablet   Other Relevant Orders      POCT A1C (Completed)   Diabetic neuropathy   Relevant Medications      benazepril (LOTENSIN) tablet      atorvastatin (LIPITOR) tablet      glipiZIDE (GLUCOTROL) tablet   Atrial fibrillation     Continue current meds.    Relevant Medications      benazepril (LOTENSIN) tablet      atorvastatin (LIPITOR) tablet      furosemide (LASIX) tablet      nitroGLYCERIN (NITROSTAT) SL tablet   Unspecified constipation   Relevant Orders      POC Hemoccult Bld/Stl (3-Cd Home Screen)    Other Visit Diagnoses   COPD bronchitis        Relevant Medications       budesonide-formoterol (SYMBICORT) 80-4.5 MCG/ACT inhaler      Return in about 6 months (around 06/15/2014) for a follow-up.

## 2013-12-13 NOTE — Assessment & Plan Note (Signed)
Check lipids 

## 2013-12-14 ENCOUNTER — Ambulatory Visit (INDEPENDENT_AMBULATORY_CARE_PROVIDER_SITE_OTHER): Payer: Commercial Managed Care - HMO | Admitting: *Deleted

## 2013-12-14 DIAGNOSIS — I2510 Atherosclerotic heart disease of native coronary artery without angina pectoris: Secondary | ICD-10-CM

## 2013-12-14 DIAGNOSIS — E785 Hyperlipidemia, unspecified: Secondary | ICD-10-CM

## 2013-12-14 DIAGNOSIS — Z5181 Encounter for therapeutic drug level monitoring: Secondary | ICD-10-CM

## 2013-12-14 DIAGNOSIS — I10 Essential (primary) hypertension: Secondary | ICD-10-CM

## 2013-12-14 DIAGNOSIS — I4891 Unspecified atrial fibrillation: Secondary | ICD-10-CM

## 2013-12-14 DIAGNOSIS — I359 Nonrheumatic aortic valve disorder, unspecified: Secondary | ICD-10-CM

## 2013-12-14 LAB — POCT INR: INR: 2.6

## 2013-12-29 ENCOUNTER — Ambulatory Visit (INDEPENDENT_AMBULATORY_CARE_PROVIDER_SITE_OTHER): Payer: Commercial Managed Care - HMO | Admitting: Pharmacist

## 2013-12-29 DIAGNOSIS — I2510 Atherosclerotic heart disease of native coronary artery without angina pectoris: Secondary | ICD-10-CM

## 2013-12-29 DIAGNOSIS — I359 Nonrheumatic aortic valve disorder, unspecified: Secondary | ICD-10-CM

## 2013-12-29 DIAGNOSIS — I4891 Unspecified atrial fibrillation: Secondary | ICD-10-CM

## 2013-12-29 DIAGNOSIS — I10 Essential (primary) hypertension: Secondary | ICD-10-CM | POA: Diagnosis not present

## 2013-12-29 DIAGNOSIS — E785 Hyperlipidemia, unspecified: Secondary | ICD-10-CM

## 2013-12-29 DIAGNOSIS — Z5181 Encounter for therapeutic drug level monitoring: Secondary | ICD-10-CM

## 2013-12-29 LAB — POCT INR: INR: 3.4

## 2014-01-12 ENCOUNTER — Ambulatory Visit (INDEPENDENT_AMBULATORY_CARE_PROVIDER_SITE_OTHER): Payer: Commercial Managed Care - HMO | Admitting: Pharmacist

## 2014-01-12 DIAGNOSIS — Z5181 Encounter for therapeutic drug level monitoring: Secondary | ICD-10-CM

## 2014-01-12 DIAGNOSIS — I4891 Unspecified atrial fibrillation: Secondary | ICD-10-CM

## 2014-01-12 DIAGNOSIS — E785 Hyperlipidemia, unspecified: Secondary | ICD-10-CM

## 2014-01-12 DIAGNOSIS — I2510 Atherosclerotic heart disease of native coronary artery without angina pectoris: Secondary | ICD-10-CM

## 2014-01-12 DIAGNOSIS — I10 Essential (primary) hypertension: Secondary | ICD-10-CM

## 2014-01-12 DIAGNOSIS — I359 Nonrheumatic aortic valve disorder, unspecified: Secondary | ICD-10-CM

## 2014-01-12 LAB — POCT INR: INR: 1.6

## 2014-01-25 ENCOUNTER — Telehealth: Payer: Self-pay | Admitting: Family Medicine

## 2014-01-25 DIAGNOSIS — E119 Type 2 diabetes mellitus without complications: Secondary | ICD-10-CM

## 2014-01-25 NOTE — Telephone Encounter (Signed)
Pt called and needs to go to the [facility] for his diabetic care. Can we start the referral process so that he can go. jw

## 2014-01-25 NOTE — Telephone Encounter (Signed)
Will forward to MD.  Then will send referral to [facility] for appt. Selvin Yun,CMA

## 2014-01-26 ENCOUNTER — Ambulatory Visit (INDEPENDENT_AMBULATORY_CARE_PROVIDER_SITE_OTHER): Payer: Commercial Managed Care - HMO | Admitting: Cardiology

## 2014-01-26 ENCOUNTER — Ambulatory Visit (INDEPENDENT_AMBULATORY_CARE_PROVIDER_SITE_OTHER): Payer: Commercial Managed Care - HMO | Admitting: Pharmacist Clinician (PhC)/ Clinical Pharmacy Specialist

## 2014-01-26 ENCOUNTER — Encounter: Payer: Self-pay | Admitting: Cardiology

## 2014-01-26 VITALS — BP 112/64 | HR 84 | Ht 69.0 in | Wt 251.6 lb

## 2014-01-26 DIAGNOSIS — I10 Essential (primary) hypertension: Secondary | ICD-10-CM | POA: Diagnosis not present

## 2014-01-26 DIAGNOSIS — E785 Hyperlipidemia, unspecified: Secondary | ICD-10-CM | POA: Diagnosis not present

## 2014-01-26 DIAGNOSIS — I4891 Unspecified atrial fibrillation: Secondary | ICD-10-CM | POA: Diagnosis not present

## 2014-01-26 DIAGNOSIS — Z954 Presence of other heart-valve replacement: Secondary | ICD-10-CM | POA: Diagnosis not present

## 2014-01-26 DIAGNOSIS — Z5181 Encounter for therapeutic drug level monitoring: Secondary | ICD-10-CM

## 2014-01-26 DIAGNOSIS — Z952 Presence of prosthetic heart valve: Secondary | ICD-10-CM

## 2014-01-26 DIAGNOSIS — I2510 Atherosclerotic heart disease of native coronary artery without angina pectoris: Secondary | ICD-10-CM

## 2014-01-26 DIAGNOSIS — I359 Nonrheumatic aortic valve disorder, unspecified: Secondary | ICD-10-CM | POA: Diagnosis not present

## 2014-01-26 DIAGNOSIS — I482 Chronic atrial fibrillation, unspecified: Secondary | ICD-10-CM

## 2014-01-26 LAB — POCT INR: INR: 1.3

## 2014-01-26 NOTE — Patient Instructions (Signed)
Continue your current therapy  I will see you in 6 months.   

## 2014-01-26 NOTE — Progress Notes (Signed)
[facility] Date of Birth: 05-29-1941 Medical Record [id number]  History of Present Illness: [name] is seen for followup today. He has a history of coronary disease with prior stenting of the LAD and left circumflex coronary disease in the past. In 2012 he underwent open heart surgery with an LIMA graft to the LAD and aortic valve replacement with a #25 mm Edwards pericardial valve. He has a history of diabetes, hypertension, and hyperlipidemia. He also has a history of chronic atrial fibrillation and is on coumadin. He has done very well from a cardiac standpoint. He denies any chest pain, dyspnea, dizziness, or palpitations. We previously switched him to Eliquis but his daughter states this made him feel terrible and was associated with joint swelling so he was switched back to Coumadin. Eliquis was also costing $180/month.  Current Outpatient Prescriptions on File Prior to Visit  Medication Sig Dispense Refill  . atorvastatin (LIPITOR) 20 MG tablet Take 1 tablet (20 mg total) by mouth daily at 6 PM.  90 tablet  3  . benazepril (LOTENSIN) 20 MG tablet Take 1 tablet (20 mg total) by mouth daily.  90 tablet  3  . budesonide-formoterol (SYMBICORT) 80-4.5 MCG/ACT inhaler Inhale 2 puffs into the lungs 2 (two) times daily.  10.2 g  11  . cholecalciferol (VITAMIN D) 1000 UNITS tablet Take 1 tablet (1,000 Units total) by mouth daily.  90 tablet  3  . furosemide (LASIX) 40 MG tablet Take 1 tablet (40 mg total) by mouth daily.  90 tablet  3  . glipiZIDE (GLUCOTROL) 10 MG tablet Take 1 tablet (10 mg total) by mouth at bedtime.  90 tablet  3  . nitroGLYCERIN (NITROSTAT) 0.4 MG SL tablet Place 1 tablet (0.4 mg total) under the tongue every 5 (five) minutes as needed. For chest pain  30 tablet  3  . potassium chloride SA (K-DUR,KLOR-CON) 20 MEQ tablet Take 1 tablet (20 mEq total) by mouth daily.  90 tablet  3  . vitamin C (ASCORBIC ACID) 500 MG tablet Take 1 tablet (500 mg total) by mouth at bedtime.  90  tablet  3  . warfarin (COUMADIN) 5 MG tablet Take 1 tablet (5 mg total) by mouth as directed.  45 tablet  3   No current facility-administered medications on file prior to visit.    Allergies  Allergen Reactions  . Pineapple Concentrate Nausea And Vomiting    Past Medical History  Diagnosis Date  . Diabetes mellitus   . Depression   . Hyperlipidemia   . Hypertension   . CAD (coronary artery disease) 2012    [place]ima-LAD  . GERD (gastroesophageal reflux disease)   . S/P AVR     #25 mm Edwards pericardial valve  . Atrial fibrillation   . HEART FAILURE, CONGESTIVE UNSPEC 02/23/2007    Qualifier: Diagnosis of  By: Mellody Drown MD, [facility]    . TOBACCO DEPENDENCE 07/29/2006    Qualifier: History of  By: Carlena Sax  MD, [facility] [place]      Past Surgical History  Procedure Laterality Date  . Coronary artery bypass graft    . Aortic valve replacement    . Coronary angioplasty with stent placement      History  Smoking status  . Former Smoker  . Quit date: 07/08/2004  Smokeless tobacco  . Never Used    History  Alcohol Use No    Family History  Problem Relation Age of Onset  . Heart disease Mother     Review  of Systems: As noted in history of present illness. All other systems were reviewed and are negative.  Physical Exam: BP 112/64  Pulse 84  Ht 5\' 9"  (1.753 m)  Wt 251 lb 9.6 oz (114.125 kg)  BMI 37.14 kg/m2 He is a pleasant black male in no acute distress. The HEENT exam is normal. The carotids are 2+ without bruits.  There is no thyromegaly.  There is no JVD.  The lungs are clear.   His median sternotomy scar has healed well. The heart exam reveals an irregular rate and rhythm  with a normal S1 and S2.  There is a soft systolic ejection sound at the right upper sternal border.  The PMI is not displaced.   Abdominal exam reveals good bowel sounds.  There is no hepatosplenomegaly or tenderness.  There are no masses.  Exam of the legs reveal no clubbing, cyanosis, or edema. The distal  pulses are intact.  Cranial nerves II - XII are intact.  Motor and sensory functions are intact.  The gait is normal.  LABORATORY DATA:  Lab Results  Component Value Date   WBC 6.6 07/10/2013   HGB 14.7 07/10/2013   HCT 45.7 07/10/2013   PLT 146.0* 07/10/2013   GLUCOSE 82 07/10/2013   CHOL 117 12/13/2013   TRIG 147 12/13/2013   HDL 38* 12/13/2013   LDLDIRECT 81 12/23/2012   LDLCALC 50 12/13/2013   ALT 18 12/23/2012   AST 20 12/23/2012   NA 140 07/10/2013   K 3.9 07/10/2013   CL 105 07/10/2013   CREATININE 1.1 07/10/2013   BUN 13 07/10/2013   CO2 26 07/10/2013   TSH 0.71 10/11/2012   PSA 0.34 05/27/2009   INR 1.3 01/26/2014   HGBA1C 7.5 12/13/2013   MICROALBUR 0.50 11/25/2011   INR 1.3 today.  Assessment / Plan: 1. Coronary disease status post remote stenting of the LAD and left circumflex. Status post CABG in 2012 with an LIMA graft to the LAD. He is asymptomatic. Continue risk factor modification.  2. Hypertension-blood pressure is controlled.  3. Aortic stenosis status post aortic valve replacement with a pericardial valve. Postoperative echocardiogram in May of 2012 showed normal valve function.  4. Hyperlipidemia. He is on Lipitor.   5. Diabetes mellitus type 2.   6. Atrial fibrillation-permanent. Rate is well controlled and the patient is asymptomatic. He has a [place] score of 2. I have recommended long-term anticoagulation. Continue coumadin. Dose adjusted today.

## 2014-01-29 ENCOUNTER — Ambulatory Visit (INDEPENDENT_AMBULATORY_CARE_PROVIDER_SITE_OTHER): Payer: Commercial Managed Care - HMO

## 2014-01-29 DIAGNOSIS — I10 Essential (primary) hypertension: Secondary | ICD-10-CM

## 2014-01-29 DIAGNOSIS — I359 Nonrheumatic aortic valve disorder, unspecified: Secondary | ICD-10-CM

## 2014-01-29 DIAGNOSIS — E785 Hyperlipidemia, unspecified: Secondary | ICD-10-CM

## 2014-01-29 DIAGNOSIS — Z5181 Encounter for therapeutic drug level monitoring: Secondary | ICD-10-CM

## 2014-01-29 DIAGNOSIS — I4891 Unspecified atrial fibrillation: Secondary | ICD-10-CM

## 2014-01-29 DIAGNOSIS — I2510 Atherosclerotic heart disease of native coronary artery without angina pectoris: Secondary | ICD-10-CM

## 2014-01-29 LAB — POCT INR: INR: 2.2

## 2014-02-09 ENCOUNTER — Ambulatory Visit (INDEPENDENT_AMBULATORY_CARE_PROVIDER_SITE_OTHER): Payer: Commercial Managed Care - HMO

## 2014-02-09 ENCOUNTER — Ambulatory Visit (INDEPENDENT_AMBULATORY_CARE_PROVIDER_SITE_OTHER): Payer: Commercial Managed Care - HMO | Admitting: *Deleted

## 2014-02-09 DIAGNOSIS — Z5181 Encounter for therapeutic drug level monitoring: Secondary | ICD-10-CM

## 2014-02-09 DIAGNOSIS — I2510 Atherosclerotic heart disease of native coronary artery without angina pectoris: Secondary | ICD-10-CM

## 2014-02-09 DIAGNOSIS — I4891 Unspecified atrial fibrillation: Secondary | ICD-10-CM

## 2014-02-09 DIAGNOSIS — I10 Essential (primary) hypertension: Secondary | ICD-10-CM

## 2014-02-09 DIAGNOSIS — E785 Hyperlipidemia, unspecified: Secondary | ICD-10-CM | POA: Diagnosis not present

## 2014-02-09 DIAGNOSIS — I359 Nonrheumatic aortic valve disorder, unspecified: Secondary | ICD-10-CM

## 2014-02-09 DIAGNOSIS — Z23 Encounter for immunization: Secondary | ICD-10-CM

## 2014-02-09 LAB — POCT INR: INR: 3.7

## 2014-02-23 ENCOUNTER — Ambulatory Visit (INDEPENDENT_AMBULATORY_CARE_PROVIDER_SITE_OTHER): Payer: Commercial Managed Care - HMO | Admitting: Pharmacist

## 2014-02-23 DIAGNOSIS — I2510 Atherosclerotic heart disease of native coronary artery without angina pectoris: Secondary | ICD-10-CM

## 2014-02-23 DIAGNOSIS — I4891 Unspecified atrial fibrillation: Secondary | ICD-10-CM

## 2014-02-23 DIAGNOSIS — Z5181 Encounter for therapeutic drug level monitoring: Secondary | ICD-10-CM

## 2014-02-23 DIAGNOSIS — E785 Hyperlipidemia, unspecified: Secondary | ICD-10-CM

## 2014-02-23 DIAGNOSIS — I359 Nonrheumatic aortic valve disorder, unspecified: Secondary | ICD-10-CM

## 2014-02-23 DIAGNOSIS — I10 Essential (primary) hypertension: Secondary | ICD-10-CM

## 2014-02-23 LAB — POCT INR: INR: 4.4

## 2014-03-09 ENCOUNTER — Ambulatory Visit (INDEPENDENT_AMBULATORY_CARE_PROVIDER_SITE_OTHER): Payer: Commercial Managed Care - HMO | Admitting: *Deleted

## 2014-03-09 DIAGNOSIS — I2510 Atherosclerotic heart disease of native coronary artery without angina pectoris: Secondary | ICD-10-CM

## 2014-03-09 DIAGNOSIS — I359 Nonrheumatic aortic valve disorder, unspecified: Secondary | ICD-10-CM

## 2014-03-09 DIAGNOSIS — E785 Hyperlipidemia, unspecified: Secondary | ICD-10-CM

## 2014-03-09 DIAGNOSIS — I10 Essential (primary) hypertension: Secondary | ICD-10-CM

## 2014-03-09 DIAGNOSIS — I4891 Unspecified atrial fibrillation: Secondary | ICD-10-CM

## 2014-03-09 DIAGNOSIS — Z5181 Encounter for therapeutic drug level monitoring: Secondary | ICD-10-CM

## 2014-03-09 LAB — POCT INR: INR: 1.3

## 2014-03-15 ENCOUNTER — Ambulatory Visit (INDEPENDENT_AMBULATORY_CARE_PROVIDER_SITE_OTHER): Payer: Commercial Managed Care - HMO | Admitting: Pharmacist

## 2014-03-15 DIAGNOSIS — I359 Nonrheumatic aortic valve disorder, unspecified: Secondary | ICD-10-CM

## 2014-03-15 DIAGNOSIS — I10 Essential (primary) hypertension: Secondary | ICD-10-CM

## 2014-03-15 DIAGNOSIS — I4891 Unspecified atrial fibrillation: Secondary | ICD-10-CM

## 2014-03-15 DIAGNOSIS — Z5181 Encounter for therapeutic drug level monitoring: Secondary | ICD-10-CM

## 2014-03-15 DIAGNOSIS — I2510 Atherosclerotic heart disease of native coronary artery without angina pectoris: Secondary | ICD-10-CM

## 2014-03-15 DIAGNOSIS — E785 Hyperlipidemia, unspecified: Secondary | ICD-10-CM

## 2014-03-15 LAB — POCT INR: INR: 1.8

## 2014-03-29 ENCOUNTER — Ambulatory Visit (INDEPENDENT_AMBULATORY_CARE_PROVIDER_SITE_OTHER): Payer: Commercial Managed Care - HMO | Admitting: *Deleted

## 2014-03-29 DIAGNOSIS — E785 Hyperlipidemia, unspecified: Secondary | ICD-10-CM

## 2014-03-29 DIAGNOSIS — I2510 Atherosclerotic heart disease of native coronary artery without angina pectoris: Secondary | ICD-10-CM

## 2014-03-29 DIAGNOSIS — I10 Essential (primary) hypertension: Secondary | ICD-10-CM

## 2014-03-29 DIAGNOSIS — I4891 Unspecified atrial fibrillation: Secondary | ICD-10-CM

## 2014-03-29 DIAGNOSIS — I359 Nonrheumatic aortic valve disorder, unspecified: Secondary | ICD-10-CM

## 2014-03-29 DIAGNOSIS — Z5181 Encounter for therapeutic drug level monitoring: Secondary | ICD-10-CM

## 2014-03-29 LAB — POCT INR: INR: 2

## 2014-06-28 ENCOUNTER — Ambulatory Visit (INDEPENDENT_AMBULATORY_CARE_PROVIDER_SITE_OTHER): Payer: Commercial Managed Care - HMO | Admitting: Family Medicine

## 2014-06-28 ENCOUNTER — Encounter: Payer: Self-pay | Admitting: Family Medicine

## 2014-06-28 VITALS — BP 158/76 | HR 72 | Temp 98.6°F | Resp 24 | Wt 262.0 lb

## 2014-06-28 DIAGNOSIS — R140 Abdominal distension (gaseous): Secondary | ICD-10-CM | POA: Diagnosis not present

## 2014-06-28 DIAGNOSIS — R413 Other amnesia: Secondary | ICD-10-CM | POA: Diagnosis not present

## 2014-06-28 DIAGNOSIS — E099 Drug or chemical induced diabetes mellitus without complications: Secondary | ICD-10-CM | POA: Diagnosis not present

## 2014-06-28 DIAGNOSIS — E210 Primary hyperparathyroidism: Secondary | ICD-10-CM

## 2014-06-28 DIAGNOSIS — R404 Transient alteration of awareness: Secondary | ICD-10-CM | POA: Diagnosis not present

## 2014-06-28 DIAGNOSIS — R4182 Altered mental status, unspecified: Secondary | ICD-10-CM | POA: Insufficient documentation

## 2014-06-28 DIAGNOSIS — I10 Essential (primary) hypertension: Secondary | ICD-10-CM | POA: Diagnosis not present

## 2014-06-28 DIAGNOSIS — F1010 Alcohol abuse, uncomplicated: Secondary | ICD-10-CM | POA: Diagnosis not present

## 2014-06-28 DIAGNOSIS — R4189 Other symptoms and signs involving cognitive functions and awareness: Secondary | ICD-10-CM

## 2014-06-28 DIAGNOSIS — I4891 Unspecified atrial fibrillation: Secondary | ICD-10-CM

## 2014-06-28 LAB — COMPREHENSIVE METABOLIC PANEL
ALT: 15 U/L (ref 0–53)
AST: 15 U/L (ref 0–37)
Albumin: 3.9 g/dL (ref 3.5–5.2)
Alkaline Phosphatase: 82 U/L (ref 39–117)
BUN: 13 mg/dL (ref 6–23)
CO2: 31 mEq/L (ref 19–32)
Calcium: 9.5 mg/dL (ref 8.4–10.5)
Chloride: 103 mEq/L (ref 96–112)
Creat: 0.98 mg/dL (ref 0.50–1.35)
Glucose, Bld: 174 mg/dL — ABNORMAL HIGH (ref 70–99)
Potassium: 4.5 mEq/L (ref 3.5–5.3)
Sodium: 140 mEq/L (ref 135–145)
Total Bilirubin: 0.6 mg/dL (ref 0.2–1.2)
Total Protein: 6.6 g/dL (ref 6.0–8.3)

## 2014-06-28 LAB — POCT URINALYSIS DIPSTICK
Glucose, UA: NEGATIVE
Ketones, UA: NEGATIVE
Leukocytes, UA: NEGATIVE
Nitrite, UA: NEGATIVE
Protein, UA: 100
Spec Grav, UA: >=1.03
Urobilinogen, UA: 1
pH, UA: 5.5

## 2014-06-28 LAB — CBC
HCT: 46.3 % (ref 39.0–52.0)
Hemoglobin: 14.6 g/dL (ref 13.0–17.0)
MCH: 28.7 pg (ref 26.0–34.0)
MCHC: 31.5 g/dL (ref 30.0–36.0)
MCV: 91.1 fL (ref 78.0–100.0)
MPV: 11.2 fL (ref 8.6–12.4)
Platelets: 140 10*3/uL — ABNORMAL LOW (ref 150–400)
RBC: 5.08 MIL/uL (ref 4.22–5.81)
RDW: 14.8 % (ref 11.5–15.5)
WBC: 5.5 10*3/uL (ref 4.0–10.5)

## 2014-06-28 LAB — VITAMIN B12: Vitamin B-12: 311 pg/mL (ref 211–911)

## 2014-06-28 LAB — POCT INR: INR: 1.3

## 2014-06-28 LAB — TSH: TSH: 2.098 u[IU]/mL (ref 0.350–4.500)

## 2014-06-28 LAB — POCT GLYCOSYLATED HEMOGLOBIN (HGB A1C): Hemoglobin A1C: 7.7

## 2014-06-28 NOTE — Patient Instructions (Signed)
We are checking many possible causes for your change in behavior and memory. We are also going to get some pictures of your abdomen to make sure your liver is ok and that you don't have fluid in your belly. We will let you and your daughter know the results of these tests as soon as we get them.  I am also attempting to get home health services for you to help with your medicines and activities of daily living.   If you have any questions at all call [phone]  Take care,  - Dr. Bonner Puna

## 2014-06-28 NOTE — Progress Notes (Signed)
Subjective:  Matthew Edwards is a 74 y.o. male accompanied by his daughter presenting for memory loss and evaluation of dementia.  Patient reports "just not remembering things" like what he talked about with people. He is sleeping fine, has a normal appetite and normal mood. He denies alcohol and other illicit drugs.   Informant (his daughter) reports 2-3 months of memory impairment and periodic agitation. He is beginning to have difficulties with ADLs as well. He as displayed consistent and worsening memory with repeating questions. He gets angry when this is brought up by his family. He does not drive or handle his own financial affairs. His healthcare power of attorney is his daughter.   He is a former smoker, and formerly abused alcohol. He denies drinking recently. He's retired from working at the [employer] "many years ago," and lives alone with his family coming by to check on him frequently. Neither he nor his daughter recollects a family history of early dementia.    Patient Active Problem List   Diagnosis Date Noted  . Unspecified constipation 12/13/2013  . Encounter for therapeutic drug monitoring 09/15/2013  . S/P AVR 12/29/2012  . Atrial fibrillation   . Diabetic neuropathy 11/25/2011  . Excessive tearing 10/06/2011  . Diabetes mellitus 08/06/2010  . BENIGN POSITIONAL VERTIGO 06/14/2010  . VITAMIN D DEFICIENCY 02/21/2010  . Primary hyperparathyroidism 06/04/2009  . Hypercalcemia 05/29/2009  . HYPERTENSION, ESSENTIAL NOS 02/23/2007  . DEPRESSION 12/15/2006  . CORONARY ARTERY DISEASE 12/15/2006  . GERD 12/15/2006  . COPD 12/10/2006  . HYPERLIPIDEMIA 07/29/2006  . ALCOHOL ABUSE, UNSPECIFIED 07/29/2006  . AORTIC STENOSIS 07/29/2006   Current Outpatient Prescriptions  Medication Sig Dispense Refill  . atorvastatin (LIPITOR) 20 MG tablet Take 1 tablet (20 mg total) by mouth daily at 6 PM. 90 tablet 3  . benazepril (LOTENSIN) 20 MG tablet Take 1 tablet (20 mg total) by  mouth daily. 90 tablet 3  . budesonide-formoterol (SYMBICORT) 80-4.5 MCG/ACT inhaler Inhale 2 puffs into the lungs 2 (two) times daily. 10.2 g 11  . cholecalciferol (VITAMIN D) 1000 UNITS tablet Take 1 tablet (1,000 Units total) by mouth daily. 90 tablet 3  . furosemide (LASIX) 40 MG tablet Take 1 tablet (40 mg total) by mouth daily. 90 tablet 3  . glipiZIDE (GLUCOTROL) 10 MG tablet Take 1 tablet (10 mg total) by mouth at bedtime. 90 tablet 3  . nitroGLYCERIN (NITROSTAT) 0.4 MG SL tablet Place 1 tablet (0.4 mg total) under the tongue every 5 (five) minutes as needed. For chest pain 30 tablet 3  . potassium chloride SA (K-DUR,KLOR-CON) 20 MEQ tablet Take 1 tablet (20 mEq total) by mouth daily. 90 tablet 3  . vitamin C (ASCORBIC ACID) 500 MG tablet Take 1 tablet (500 mg total) by mouth at bedtime. 90 tablet 3  . warfarin (COUMADIN) 5 MG tablet Take 1 tablet (5 mg total) by mouth as directed. 45 tablet 3   No current facility-administered medications for this visit.    Review of Systems:  Per HPI. All other systems reviewed and are negative. Objective:  BP 158/76 mmHg  Pulse 72  Temp(Src) 98.6 F (37 C) (Oral)  Resp 24  Wt 262 lb (118.842 kg)  SpO2 96% Gen: Alert 74 y.o. male in NAD HEENT: Atraumatic Abd: Distended, nontender, +BS, difficult to evaluate for fluid wave.  Neuro: MMSE: 17. Normal gait. No bradykinesia, rigidity or tremors.No focal deficits in strength and sensation.   Psych: Maintains good eye contact and is cooperative and  attentive. Speech is normal volume and rate. Mood is "good" with a mildly restricted affect. Does not appear to be responding to any internal stimuli, but does tend to detach from conversation at times. Seems to find it difficult to maintain train of thought and concentrate on the questions. Last MMSE (2012): 28 Assessment/Plan:  Matthew Edwards is a 74 y.o. male here for evaluation of cognitive impairment.

## 2014-07-02 DIAGNOSIS — R4189 Other symptoms and signs involving cognitive functions and awareness: Secondary | ICD-10-CM | POA: Insufficient documentation

## 2014-07-02 NOTE — Assessment & Plan Note (Signed)
In setting of history of alcohol abuse. Need to get U/S. Don't suspect SBP.

## 2014-07-02 NOTE — Assessment & Plan Note (Signed)
Dramatic change in MMSE over last 3 years concerning for alzheimer dementia with some aggressive behavior atypical for him. This may be frontotemporal dementia, or , with his risk factors, vascular dementia though time course doesn't support this. No signs of parkinsonism.   - TSH - Vit B12 - CMP - CBC - INR - Needs home health assistance with medication administration and some ADLs. - Deferring neuroimaging at this time as exam is nonfocal and onset is gradual.  - Cholinesterase inhibitor NOT started today, as secondary causes are being ruled out (MMSE 10-26). - Call her daughter Cheryle Horsfall, medical power of attorney, at [phone] with updates of lab work.

## 2014-07-03 ENCOUNTER — Telehealth: Payer: Self-pay | Admitting: Family Medicine

## 2014-07-03 NOTE — Addendum Note (Signed)
Addended by: Vance Gather B on: 07/03/2014 09:43 AM   Modules accepted: Orders

## 2014-07-03 NOTE — Telephone Encounter (Signed)
Will forward to Education officer, museum. Jazmin Hartsell,CMA

## 2014-07-03 NOTE — Telephone Encounter (Signed)
[facility] [place]: needs copy  Of insurance card so his home heath can be ordered

## 2014-07-03 NOTE — Telephone Encounter (Signed)
A copy of pts insurance card has been faxed to [employer].  Hunt Oris, MSW, [place]

## 2014-07-03 NOTE — Addendum Note (Signed)
Addended by: Patrecia Pour on: 07/03/2014 09:38 AM   Modules accepted: Orders

## 2014-07-05 ENCOUNTER — Ambulatory Visit
Admission: RE | Admit: 2014-07-05 | Discharge: 2014-07-05 | Disposition: A | Discharge status: Home / Self Care | Payer: Commercial Managed Care - HMO | Source: Ambulatory Visit | Attending: Family Medicine | Admitting: Family Medicine

## 2014-07-05 DIAGNOSIS — R140 Abdominal distension (gaseous): Secondary | ICD-10-CM | POA: Diagnosis not present

## 2014-07-05 DIAGNOSIS — R404 Transient alteration of awareness: Secondary | ICD-10-CM

## 2014-07-05 DIAGNOSIS — F1010 Alcohol abuse, uncomplicated: Secondary | ICD-10-CM

## 2014-07-11 ENCOUNTER — Telehealth: Payer: Self-pay | Admitting: Family Medicine

## 2014-07-11 NOTE — Telephone Encounter (Signed)
No obvious secondary cause for cognitive impairment. Pt should be reevaluated and likely started on medication for early dementia.

## 2014-07-11 NOTE — Telephone Encounter (Signed)
Spoke with pt daughter and informed her of below.  She will call and set up appointment for her dad to be reevaluated. Katharina Caper, Katye Valek D

## 2014-07-13 ENCOUNTER — Ambulatory Visit (INDEPENDENT_AMBULATORY_CARE_PROVIDER_SITE_OTHER): Payer: Self-pay | Admitting: Internal Medicine

## 2014-07-13 DIAGNOSIS — M25569 Pain in unspecified knee: Secondary | ICD-10-CM | POA: Diagnosis not present

## 2014-07-13 DIAGNOSIS — F329 Major depressive disorder, single episode, unspecified: Secondary | ICD-10-CM | POA: Diagnosis not present

## 2014-07-13 DIAGNOSIS — I4891 Unspecified atrial fibrillation: Secondary | ICD-10-CM

## 2014-07-13 DIAGNOSIS — M6281 Muscle weakness (generalized): Secondary | ICD-10-CM | POA: Diagnosis not present

## 2014-07-13 DIAGNOSIS — I509 Heart failure, unspecified: Secondary | ICD-10-CM | POA: Diagnosis not present

## 2014-07-13 DIAGNOSIS — F0390 Unspecified dementia without behavioral disturbance: Secondary | ICD-10-CM | POA: Diagnosis not present

## 2014-07-13 DIAGNOSIS — I2510 Atherosclerotic heart disease of native coronary artery without angina pectoris: Secondary | ICD-10-CM | POA: Diagnosis not present

## 2014-07-13 DIAGNOSIS — E1140 Type 2 diabetes mellitus with diabetic neuropathy, unspecified: Secondary | ICD-10-CM | POA: Diagnosis not present

## 2014-07-13 DIAGNOSIS — I10 Essential (primary) hypertension: Secondary | ICD-10-CM | POA: Diagnosis not present

## 2014-07-13 DIAGNOSIS — Z5181 Encounter for therapeutic drug level monitoring: Secondary | ICD-10-CM

## 2014-07-13 LAB — POCT INR: INR: 1.9

## 2014-07-17 DIAGNOSIS — M25569 Pain in unspecified knee: Secondary | ICD-10-CM | POA: Diagnosis not present

## 2014-07-17 DIAGNOSIS — I2510 Atherosclerotic heart disease of native coronary artery without angina pectoris: Secondary | ICD-10-CM | POA: Diagnosis not present

## 2014-07-17 DIAGNOSIS — I4891 Unspecified atrial fibrillation: Secondary | ICD-10-CM | POA: Diagnosis not present

## 2014-07-17 DIAGNOSIS — F329 Major depressive disorder, single episode, unspecified: Secondary | ICD-10-CM | POA: Diagnosis not present

## 2014-07-17 DIAGNOSIS — I10 Essential (primary) hypertension: Secondary | ICD-10-CM | POA: Diagnosis not present

## 2014-07-17 DIAGNOSIS — E1140 Type 2 diabetes mellitus with diabetic neuropathy, unspecified: Secondary | ICD-10-CM | POA: Diagnosis not present

## 2014-07-17 DIAGNOSIS — F0390 Unspecified dementia without behavioral disturbance: Secondary | ICD-10-CM | POA: Diagnosis not present

## 2014-07-17 DIAGNOSIS — I509 Heart failure, unspecified: Secondary | ICD-10-CM | POA: Diagnosis not present

## 2014-07-17 DIAGNOSIS — M6281 Muscle weakness (generalized): Secondary | ICD-10-CM | POA: Diagnosis not present

## 2014-07-18 DIAGNOSIS — F0390 Unspecified dementia without behavioral disturbance: Secondary | ICD-10-CM | POA: Diagnosis not present

## 2014-07-18 DIAGNOSIS — I4891 Unspecified atrial fibrillation: Secondary | ICD-10-CM | POA: Diagnosis not present

## 2014-07-18 DIAGNOSIS — E1140 Type 2 diabetes mellitus with diabetic neuropathy, unspecified: Secondary | ICD-10-CM | POA: Diagnosis not present

## 2014-07-18 DIAGNOSIS — M25569 Pain in unspecified knee: Secondary | ICD-10-CM | POA: Diagnosis not present

## 2014-07-18 DIAGNOSIS — I2510 Atherosclerotic heart disease of native coronary artery without angina pectoris: Secondary | ICD-10-CM | POA: Diagnosis not present

## 2014-07-18 DIAGNOSIS — M6281 Muscle weakness (generalized): Secondary | ICD-10-CM | POA: Diagnosis not present

## 2014-07-18 DIAGNOSIS — I509 Heart failure, unspecified: Secondary | ICD-10-CM | POA: Diagnosis not present

## 2014-07-18 DIAGNOSIS — F329 Major depressive disorder, single episode, unspecified: Secondary | ICD-10-CM | POA: Diagnosis not present

## 2014-07-18 DIAGNOSIS — I10 Essential (primary) hypertension: Secondary | ICD-10-CM | POA: Diagnosis not present

## 2014-07-19 DIAGNOSIS — I4891 Unspecified atrial fibrillation: Secondary | ICD-10-CM | POA: Diagnosis not present

## 2014-07-19 DIAGNOSIS — F329 Major depressive disorder, single episode, unspecified: Secondary | ICD-10-CM | POA: Diagnosis not present

## 2014-07-19 DIAGNOSIS — I509 Heart failure, unspecified: Secondary | ICD-10-CM | POA: Diagnosis not present

## 2014-07-19 DIAGNOSIS — E1140 Type 2 diabetes mellitus with diabetic neuropathy, unspecified: Secondary | ICD-10-CM | POA: Diagnosis not present

## 2014-07-19 DIAGNOSIS — F0390 Unspecified dementia without behavioral disturbance: Secondary | ICD-10-CM | POA: Diagnosis not present

## 2014-07-19 DIAGNOSIS — I10 Essential (primary) hypertension: Secondary | ICD-10-CM | POA: Diagnosis not present

## 2014-07-19 DIAGNOSIS — I2510 Atherosclerotic heart disease of native coronary artery without angina pectoris: Secondary | ICD-10-CM | POA: Diagnosis not present

## 2014-07-19 DIAGNOSIS — M6281 Muscle weakness (generalized): Secondary | ICD-10-CM | POA: Diagnosis not present

## 2014-07-19 DIAGNOSIS — M25569 Pain in unspecified knee: Secondary | ICD-10-CM | POA: Diagnosis not present

## 2014-07-21 DIAGNOSIS — I509 Heart failure, unspecified: Secondary | ICD-10-CM | POA: Diagnosis not present

## 2014-07-21 DIAGNOSIS — F329 Major depressive disorder, single episode, unspecified: Secondary | ICD-10-CM | POA: Diagnosis not present

## 2014-07-21 DIAGNOSIS — I2510 Atherosclerotic heart disease of native coronary artery without angina pectoris: Secondary | ICD-10-CM | POA: Diagnosis not present

## 2014-07-21 DIAGNOSIS — F0390 Unspecified dementia without behavioral disturbance: Secondary | ICD-10-CM | POA: Diagnosis not present

## 2014-07-21 DIAGNOSIS — M25569 Pain in unspecified knee: Secondary | ICD-10-CM | POA: Diagnosis not present

## 2014-07-21 DIAGNOSIS — M6281 Muscle weakness (generalized): Secondary | ICD-10-CM | POA: Diagnosis not present

## 2014-07-21 DIAGNOSIS — I4891 Unspecified atrial fibrillation: Secondary | ICD-10-CM | POA: Diagnosis not present

## 2014-07-21 DIAGNOSIS — I10 Essential (primary) hypertension: Secondary | ICD-10-CM | POA: Diagnosis not present

## 2014-07-21 DIAGNOSIS — E1140 Type 2 diabetes mellitus with diabetic neuropathy, unspecified: Secondary | ICD-10-CM | POA: Diagnosis not present

## 2014-07-24 DIAGNOSIS — I10 Essential (primary) hypertension: Secondary | ICD-10-CM | POA: Diagnosis not present

## 2014-07-24 DIAGNOSIS — E1140 Type 2 diabetes mellitus with diabetic neuropathy, unspecified: Secondary | ICD-10-CM | POA: Diagnosis not present

## 2014-07-24 DIAGNOSIS — F0390 Unspecified dementia without behavioral disturbance: Secondary | ICD-10-CM | POA: Diagnosis not present

## 2014-07-24 DIAGNOSIS — I509 Heart failure, unspecified: Secondary | ICD-10-CM | POA: Diagnosis not present

## 2014-07-24 DIAGNOSIS — F329 Major depressive disorder, single episode, unspecified: Secondary | ICD-10-CM | POA: Diagnosis not present

## 2014-07-24 DIAGNOSIS — I2510 Atherosclerotic heart disease of native coronary artery without angina pectoris: Secondary | ICD-10-CM | POA: Diagnosis not present

## 2014-07-24 DIAGNOSIS — I4891 Unspecified atrial fibrillation: Secondary | ICD-10-CM | POA: Diagnosis not present

## 2014-07-24 DIAGNOSIS — M25569 Pain in unspecified knee: Secondary | ICD-10-CM | POA: Diagnosis not present

## 2014-07-24 DIAGNOSIS — M6281 Muscle weakness (generalized): Secondary | ICD-10-CM | POA: Diagnosis not present

## 2014-07-26 DIAGNOSIS — I4891 Unspecified atrial fibrillation: Secondary | ICD-10-CM | POA: Diagnosis not present

## 2014-07-26 DIAGNOSIS — I509 Heart failure, unspecified: Secondary | ICD-10-CM | POA: Diagnosis not present

## 2014-07-26 DIAGNOSIS — E1140 Type 2 diabetes mellitus with diabetic neuropathy, unspecified: Secondary | ICD-10-CM | POA: Diagnosis not present

## 2014-07-26 DIAGNOSIS — F0390 Unspecified dementia without behavioral disturbance: Secondary | ICD-10-CM | POA: Diagnosis not present

## 2014-07-26 DIAGNOSIS — M25569 Pain in unspecified knee: Secondary | ICD-10-CM | POA: Diagnosis not present

## 2014-07-26 DIAGNOSIS — I10 Essential (primary) hypertension: Secondary | ICD-10-CM | POA: Diagnosis not present

## 2014-07-26 DIAGNOSIS — F329 Major depressive disorder, single episode, unspecified: Secondary | ICD-10-CM | POA: Diagnosis not present

## 2014-07-26 DIAGNOSIS — M6281 Muscle weakness (generalized): Secondary | ICD-10-CM | POA: Diagnosis not present

## 2014-07-26 DIAGNOSIS — I2510 Atherosclerotic heart disease of native coronary artery without angina pectoris: Secondary | ICD-10-CM | POA: Diagnosis not present

## 2014-07-27 DIAGNOSIS — I2510 Atherosclerotic heart disease of native coronary artery without angina pectoris: Secondary | ICD-10-CM | POA: Diagnosis not present

## 2014-07-27 DIAGNOSIS — I10 Essential (primary) hypertension: Secondary | ICD-10-CM | POA: Diagnosis not present

## 2014-07-27 DIAGNOSIS — E1140 Type 2 diabetes mellitus with diabetic neuropathy, unspecified: Secondary | ICD-10-CM | POA: Diagnosis not present

## 2014-07-27 DIAGNOSIS — M6281 Muscle weakness (generalized): Secondary | ICD-10-CM | POA: Diagnosis not present

## 2014-07-27 DIAGNOSIS — I4891 Unspecified atrial fibrillation: Secondary | ICD-10-CM | POA: Diagnosis not present

## 2014-07-27 DIAGNOSIS — M25569 Pain in unspecified knee: Secondary | ICD-10-CM | POA: Diagnosis not present

## 2014-07-27 DIAGNOSIS — I509 Heart failure, unspecified: Secondary | ICD-10-CM | POA: Diagnosis not present

## 2014-07-27 DIAGNOSIS — F329 Major depressive disorder, single episode, unspecified: Secondary | ICD-10-CM | POA: Diagnosis not present

## 2014-07-27 DIAGNOSIS — F0390 Unspecified dementia without behavioral disturbance: Secondary | ICD-10-CM | POA: Diagnosis not present

## 2014-07-28 DIAGNOSIS — M25569 Pain in unspecified knee: Secondary | ICD-10-CM | POA: Diagnosis not present

## 2014-07-28 DIAGNOSIS — I509 Heart failure, unspecified: Secondary | ICD-10-CM | POA: Diagnosis not present

## 2014-07-28 DIAGNOSIS — I2510 Atherosclerotic heart disease of native coronary artery without angina pectoris: Secondary | ICD-10-CM | POA: Diagnosis not present

## 2014-07-28 DIAGNOSIS — F329 Major depressive disorder, single episode, unspecified: Secondary | ICD-10-CM | POA: Diagnosis not present

## 2014-07-28 DIAGNOSIS — F0390 Unspecified dementia without behavioral disturbance: Secondary | ICD-10-CM | POA: Diagnosis not present

## 2014-07-28 DIAGNOSIS — E1140 Type 2 diabetes mellitus with diabetic neuropathy, unspecified: Secondary | ICD-10-CM | POA: Diagnosis not present

## 2014-07-28 DIAGNOSIS — M6281 Muscle weakness (generalized): Secondary | ICD-10-CM | POA: Diagnosis not present

## 2014-07-28 DIAGNOSIS — I10 Essential (primary) hypertension: Secondary | ICD-10-CM | POA: Diagnosis not present

## 2014-07-28 DIAGNOSIS — I4891 Unspecified atrial fibrillation: Secondary | ICD-10-CM | POA: Diagnosis not present

## 2014-07-31 DIAGNOSIS — E1140 Type 2 diabetes mellitus with diabetic neuropathy, unspecified: Secondary | ICD-10-CM | POA: Diagnosis not present

## 2014-07-31 DIAGNOSIS — M6281 Muscle weakness (generalized): Secondary | ICD-10-CM | POA: Diagnosis not present

## 2014-07-31 DIAGNOSIS — I2510 Atherosclerotic heart disease of native coronary artery without angina pectoris: Secondary | ICD-10-CM | POA: Diagnosis not present

## 2014-07-31 DIAGNOSIS — I4891 Unspecified atrial fibrillation: Secondary | ICD-10-CM | POA: Diagnosis not present

## 2014-07-31 DIAGNOSIS — I509 Heart failure, unspecified: Secondary | ICD-10-CM | POA: Diagnosis not present

## 2014-07-31 DIAGNOSIS — I10 Essential (primary) hypertension: Secondary | ICD-10-CM | POA: Diagnosis not present

## 2014-07-31 DIAGNOSIS — F0390 Unspecified dementia without behavioral disturbance: Secondary | ICD-10-CM | POA: Diagnosis not present

## 2014-07-31 DIAGNOSIS — M25569 Pain in unspecified knee: Secondary | ICD-10-CM | POA: Diagnosis not present

## 2014-07-31 DIAGNOSIS — F329 Major depressive disorder, single episode, unspecified: Secondary | ICD-10-CM | POA: Diagnosis not present

## 2014-08-02 ENCOUNTER — Emergency Department (HOSPITAL_COMMUNITY): Payer: Commercial Managed Care - HMO

## 2014-08-02 ENCOUNTER — Encounter (HOSPITAL_COMMUNITY): Payer: Self-pay

## 2014-08-02 ENCOUNTER — Inpatient Hospital Stay (HOSPITAL_COMMUNITY)
Admission: EM | Admit: 2014-08-02 | Discharge: 2014-08-05 | DRG: 293 | Disposition: A | Discharge status: Home / Self Care | Payer: Commercial Managed Care - HMO | Attending: Family Medicine | Admitting: Family Medicine

## 2014-08-02 DIAGNOSIS — E119 Type 2 diabetes mellitus without complications: Secondary | ICD-10-CM | POA: Diagnosis present

## 2014-08-02 DIAGNOSIS — Z888 Allergy status to other drugs, medicaments and biological substances status: Secondary | ICD-10-CM | POA: Diagnosis not present

## 2014-08-02 DIAGNOSIS — I2510 Atherosclerotic heart disease of native coronary artery without angina pectoris: Secondary | ICD-10-CM | POA: Diagnosis not present

## 2014-08-02 DIAGNOSIS — Z951 Presence of aortocoronary bypass graft: Secondary | ICD-10-CM

## 2014-08-02 DIAGNOSIS — F329 Major depressive disorder, single episode, unspecified: Secondary | ICD-10-CM | POA: Diagnosis present

## 2014-08-02 DIAGNOSIS — R0602 Shortness of breath: Secondary | ICD-10-CM | POA: Diagnosis present

## 2014-08-02 DIAGNOSIS — I4891 Unspecified atrial fibrillation: Secondary | ICD-10-CM | POA: Diagnosis not present

## 2014-08-02 DIAGNOSIS — F1010 Alcohol abuse, uncomplicated: Secondary | ICD-10-CM | POA: Diagnosis not present

## 2014-08-02 DIAGNOSIS — Z87891 Personal history of nicotine dependence: Secondary | ICD-10-CM

## 2014-08-02 DIAGNOSIS — I359 Nonrheumatic aortic valve disorder, unspecified: Secondary | ICD-10-CM | POA: Diagnosis not present

## 2014-08-02 DIAGNOSIS — E118 Type 2 diabetes mellitus with unspecified complications: Secondary | ICD-10-CM | POA: Diagnosis not present

## 2014-08-02 DIAGNOSIS — J449 Chronic obstructive pulmonary disease, unspecified: Secondary | ICD-10-CM | POA: Diagnosis present

## 2014-08-02 DIAGNOSIS — E210 Primary hyperparathyroidism: Secondary | ICD-10-CM | POA: Diagnosis present

## 2014-08-02 DIAGNOSIS — I5033 Acute on chronic diastolic (congestive) heart failure: Principal | ICD-10-CM

## 2014-08-02 DIAGNOSIS — F0390 Unspecified dementia without behavioral disturbance: Secondary | ICD-10-CM | POA: Diagnosis not present

## 2014-08-02 DIAGNOSIS — IMO0001 Reserved for inherently not codable concepts without codable children: Secondary | ICD-10-CM

## 2014-08-02 DIAGNOSIS — M25569 Pain in unspecified knee: Secondary | ICD-10-CM | POA: Diagnosis not present

## 2014-08-02 DIAGNOSIS — I272 Other secondary pulmonary hypertension: Secondary | ICD-10-CM | POA: Diagnosis present

## 2014-08-02 DIAGNOSIS — I10 Essential (primary) hypertension: Secondary | ICD-10-CM

## 2014-08-02 DIAGNOSIS — I509 Heart failure, unspecified: Secondary | ICD-10-CM | POA: Diagnosis not present

## 2014-08-02 DIAGNOSIS — R509 Fever, unspecified: Secondary | ICD-10-CM | POA: Diagnosis not present

## 2014-08-02 DIAGNOSIS — Z955 Presence of coronary angioplasty implant and graft: Secondary | ICD-10-CM

## 2014-08-02 DIAGNOSIS — G4733 Obstructive sleep apnea (adult) (pediatric): Secondary | ICD-10-CM | POA: Diagnosis present

## 2014-08-02 DIAGNOSIS — I482 Chronic atrial fibrillation, unspecified: Secondary | ICD-10-CM | POA: Insufficient documentation

## 2014-08-02 DIAGNOSIS — Z7901 Long term (current) use of anticoagulants: Secondary | ICD-10-CM | POA: Diagnosis not present

## 2014-08-02 DIAGNOSIS — R05 Cough: Secondary | ICD-10-CM | POA: Diagnosis not present

## 2014-08-02 DIAGNOSIS — E1140 Type 2 diabetes mellitus with diabetic neuropathy, unspecified: Secondary | ICD-10-CM | POA: Diagnosis not present

## 2014-08-02 DIAGNOSIS — E785 Hyperlipidemia, unspecified: Secondary | ICD-10-CM | POA: Diagnosis not present

## 2014-08-02 DIAGNOSIS — Z952 Presence of prosthetic heart valve: Secondary | ICD-10-CM

## 2014-08-02 DIAGNOSIS — K219 Gastro-esophageal reflux disease without esophagitis: Secondary | ICD-10-CM | POA: Diagnosis present

## 2014-08-02 DIAGNOSIS — M6281 Muscle weakness (generalized): Secondary | ICD-10-CM | POA: Diagnosis not present

## 2014-08-02 HISTORY — DX: Chronic obstructive pulmonary disease, unspecified: J44.9

## 2014-08-02 HISTORY — DX: Type 2 diabetes mellitus without complications: E11.9

## 2014-08-02 HISTORY — DX: Adjustment disorder with depressed mood: F43.21

## 2014-08-02 LAB — CBC
HCT: 47.4 % (ref 39.0–52.0)
Hemoglobin: 14.9 g/dL (ref 13.0–17.0)
MCH: 28.9 pg (ref 26.0–34.0)
MCHC: 31.4 g/dL (ref 30.0–36.0)
MCV: 92 fL (ref 78.0–100.0)
Platelets: 126 10*3/uL — ABNORMAL LOW (ref 150–400)
RBC: 5.15 MIL/uL (ref 4.22–5.81)
RDW: 14.2 % (ref 11.5–15.5)
WBC: 6 10*3/uL (ref 4.0–10.5)

## 2014-08-02 LAB — COMPREHENSIVE METABOLIC PANEL
ALT: 18 U/L (ref 0–53)
AST: 23 U/L (ref 0–37)
Albumin: 4 g/dL (ref 3.5–5.2)
Alkaline Phosphatase: 86 U/L (ref 39–117)
Anion gap: 3 — ABNORMAL LOW (ref 5–15)
BUN: 18 mg/dL (ref 6–23)
CO2: 34 mmol/L — ABNORMAL HIGH (ref 19–32)
Calcium: 9.7 mg/dL (ref 8.4–10.5)
Chloride: 105 mmol/L (ref 96–112)
Creatinine, Ser: 0.95 mg/dL (ref 0.50–1.35)
GFR calc Af Amer: >90 mL/min (ref >90)
GFR calc non Af Amer: 82 mL/min — ABNORMAL LOW (ref >90)
Glucose, Bld: 136 mg/dL — ABNORMAL HIGH (ref 70–99)
Potassium: 4.5 mmol/L (ref 3.5–5.1)
Sodium: 142 mmol/L (ref 135–145)
Total Bilirubin: 0.9 mg/dL (ref 0.3–1.2)
Total Protein: 6.8 g/dL (ref 6.0–8.3)

## 2014-08-02 LAB — CBG MONITORING, ED: Glucose-Capillary: 108 mg/dL — ABNORMAL HIGH (ref 70–99)

## 2014-08-02 LAB — TSH: TSH: 1.417 u[IU]/mL (ref 0.350–4.500)

## 2014-08-02 LAB — PROTIME-INR
INR: 1.31 (ref 0.00–1.49)
Prothrombin Time: 16.4 seconds — ABNORMAL HIGH (ref 11.6–15.2)

## 2014-08-02 LAB — BRAIN NATRIURETIC PEPTIDE: B Natriuretic Peptide: 178.5 pg/mL — ABNORMAL HIGH (ref 0.0–100.0)

## 2014-08-02 LAB — TROPONIN I: Troponin I: <0.03 ng/mL (ref <0.031)

## 2014-08-02 LAB — I-STAT TROPONIN, ED: Troponin i, poc: 0 ng/mL (ref 0.00–0.08)

## 2014-08-02 LAB — GLUCOSE, CAPILLARY: Glucose-Capillary: 122 mg/dL — ABNORMAL HIGH (ref 70–99)

## 2014-08-02 LAB — MAGNESIUM: Magnesium: 2.2 mg/dL (ref 1.5–2.5)

## 2014-08-02 MED ORDER — FUROSEMIDE 10 MG/ML IJ SOLN
40.0000 mg | Freq: Two times a day (BID) | INTRAMUSCULAR | Status: DC
  Administered 2014-08-03 – 2014-08-05 (×5): 40 mg via INTRAVENOUS
  Filled 2014-08-02 (×8): qty 4

## 2014-08-02 MED ORDER — ATORVASTATIN CALCIUM 40 MG PO TABS
40.0000 mg | ORAL_TABLET | Freq: Every day | ORAL | Status: DC
  Administered 2014-08-03 – 2014-08-04 (×2): 40 mg via ORAL
  Filled 2014-08-02 (×3): qty 1

## 2014-08-02 MED ORDER — GLIPIZIDE 10 MG PO TABS
10.0000 mg | ORAL_TABLET | Freq: Every day | ORAL | Status: DC
  Administered 2014-08-02 – 2014-08-04 (×3): 10 mg via ORAL
  Filled 2014-08-02 (×5): qty 1

## 2014-08-02 MED ORDER — SODIUM CHLORIDE 0.9 % IJ SOLN
3.0000 mL | Freq: Two times a day (BID) | INTRAMUSCULAR | Status: DC
  Administered 2014-08-02 – 2014-08-04 (×5): 3 mL via INTRAVENOUS

## 2014-08-02 MED ORDER — VITAMIN C 500 MG PO TABS
500.0000 mg | ORAL_TABLET | Freq: Every day | ORAL | Status: DC
  Administered 2014-08-02 – 2014-08-04 (×3): 500 mg via ORAL
  Filled 2014-08-02 (×5): qty 1

## 2014-08-02 MED ORDER — SENNA 8.6 MG PO TABS
1.0000 | ORAL_TABLET | Freq: Two times a day (BID) | ORAL | Status: DC
  Administered 2014-08-02 – 2014-08-04 (×4): 8.6 mg via ORAL
  Filled 2014-08-02 (×9): qty 1

## 2014-08-02 MED ORDER — FUROSEMIDE 10 MG/ML IJ SOLN
40.0000 mg | Freq: Once | INTRAMUSCULAR | Status: AC
  Administered 2014-08-02: 40 mg via INTRAVENOUS
  Filled 2014-08-02: qty 4

## 2014-08-02 MED ORDER — WARFARIN - PHARMACIST DOSING INPATIENT
Freq: Every day | Status: DC
  Administered 2014-08-03: 18:00:00

## 2014-08-02 MED ORDER — NITROGLYCERIN 0.4 MG SL SUBL: 0.4000 mg | SUBLINGUAL_TABLET | SUBLINGUAL | Status: DC | PRN

## 2014-08-02 MED ORDER — WARFARIN SODIUM 7.5 MG PO TABS
7.5000 mg | ORAL_TABLET | Freq: Once | ORAL | Status: AC
  Administered 2014-08-02: 7.5 mg via ORAL
  Filled 2014-08-02: qty 1

## 2014-08-02 MED ORDER — BENAZEPRIL HCL 20 MG PO TABS
20.0000 mg | ORAL_TABLET | Freq: Every day | ORAL | Status: DC
  Administered 2014-08-02 – 2014-08-05 (×4): 20 mg via ORAL
  Filled 2014-08-02 (×4): qty 1

## 2014-08-02 MED ORDER — BUDESONIDE-FORMOTEROL FUMARATE 80-4.5 MCG/ACT IN AERO
2.0000 | INHALATION_SPRAY | Freq: Two times a day (BID) | RESPIRATORY_TRACT | Status: DC
  Administered 2014-08-02 – 2014-08-05 (×3): 2 via RESPIRATORY_TRACT
  Filled 2014-08-02 (×2): qty 6.9

## 2014-08-02 MED ORDER — VITAMIN D3 25 MCG (1000 UNIT) PO TABS
1000.0000 [IU] | ORAL_TABLET | Freq: Every day | ORAL | Status: DC
  Administered 2014-08-03 – 2014-08-05 (×3): 1000 [IU] via ORAL
  Filled 2014-08-02 (×3): qty 1

## 2014-08-02 MED ORDER — POTASSIUM CHLORIDE CRYS ER 20 MEQ PO TBCR
20.0000 meq | EXTENDED_RELEASE_TABLET | Freq: Every day | ORAL | Status: DC
  Administered 2014-08-02 – 2014-08-05 (×4): 20 meq via ORAL
  Filled 2014-08-02 (×4): qty 1

## 2014-08-02 NOTE — ED Notes (Signed)
Pt states he has an aid that comes by to check on him and said the right leg was swollen more than the left. Pt does have hx of CHF. sats in triage was 90 %. Pt feels SOB.

## 2014-08-02 NOTE — Progress Notes (Signed)
Pt stated that he did not feel like wearing a CPAP tonight.

## 2014-08-02 NOTE — ED Notes (Signed)
Family medicine at bedside.

## 2014-08-02 NOTE — H&P (Signed)
[facility] Admission History and Physical Service Pager: [phone]  Patient name: Matthew Edwards Medical record number: [id number] Date of birth: Jan 25, 1941 Age: 74 y.o. Gender: male  Primary Care Provider: Donnamae Jude, MD Consultants: None Code Status: Full   Chief Complaint: Shortness of breath.   Assessment and Plan: Matthew Edwards is a 74 y.o. male presenting with shortness of breath . PMH is significant for diastolic CHF, hyperlipidemia, alcohol abuse, essential hypertension, CAD, COPD, GERD, DMII, atrial fibrillation, aortic valve replacement, chronic anticoagulation.   # Acute on Chronic CHFpEF Exacerbation -  Pt. Here with SOB x 5 days that has been worsening. SOB now with walking across the room. Worsening orthopnea. Poor diet compliance. Has missed multiple doses of lasix this week. Weight gain over the past month with baseline around 250 and 262 here. Known CAD with stent in LAD and LCircumflex. Last Echo 09/2012 with preserved ejection fraction. Aortic valve s/p replacement, and calcified mitral anulus. Right atrium mildly dilated, left atrium moderately dilated, moderate tricuspid regurg. BNP - 178.5. CXR - with interstitial edema and pulmonary vascular congestion. EKG - Atrial fibrillation. Troponin negative. Creatinine stable. Pitting edema to mid lower extremity bilaterally, and taught abdomen.  - Admit to telemetry under Dr. McDiarmid - Strict I/ O Daily weights.  - IV Lasix 40 BID.  - Consult HF team in the AM. (cardiologist [name]. )  - Trend BMETs - Echo in the am.  - Continue ACEi, Consider BB  - Heart healthy diet.  - fluid restricted to 1200cc.  - Continue home KCL   # Coronary Artery Disease S/P PCI - Previously had Cath with stent placed in LAD and left circumflex. On coumadin. No chest pain this admission. EKG without acute changes. In Atrial fibrillation. Istat Troponin in the ED negative.  - Continue meds as above.  -  Nitrostat prn for chest pain.  - Echo in the am as above.  - Cycle troponins.  - EKG in the am.   # Atrial Fibrillation - Pt. Here with known a-fib and atrial enlargement on last echocardiogram. Rate controlled on his own without intervention. HR in the 70's - 80's while in the ED. On Coumadin INR 1.31.  - Telemetry for monitoring - If HR > 100 will consider BB - Warfarin per pharmacy - f/u rate control.   # History of Primary Hyperparathyroidism - Calcium normal here.  - On Vit. D at home. Continue here.   # COPD - Suspect that his symptoms are mostly related to CHF exacerbation. No wheeze on exam.  - Continue home symbicort here.  - If worsening or little improvement, Consider COPD exacerbation and treat.  - Monitor O2 sats on tele.   # HTN -  Pt. Hypertensive but normotensive here.  - On Benazapril at home continue here.   # HLD - Continue Lipitor 20mg . Consider titrating up to 40mg  given risk factors.   # DMII - On glipizide at home. Last A1C > 7.7 - Continue Glipizide here.  - Monitor CBG's  - Consider adding SSI if CBG's poorly controlled.   # ETOH Abuse -  In remote history.  - CIWA here.   # GERD  - protonix here.   FEN/GI: Heart healthy diet with 1200cc fluid restriction.  Prophylaxis: Anticoagulated with coumadin. Coumadin per farm.   Disposition: Pending improvement in fluid overload.   History of Present Illness: Matthew Edwards is a 74 y.o. male presenting with Worsening SOB and weight  gain over the past 4 days. He has had increased dyspnea with exertion, and worsening orthopnea as well. Increased swelling in his lower extremities. He has missed at least 2-3 days of lasix. He has also been somewhat noncompliant with his diet and has been eating some [place] bacon and canned vegetables / soup. He also no chest pain. He has no fevers, chills, nausea, vomiting, or diarrhea. He says that his abdomen has become more taught as well. No sick contacts. No congestion.   In  the ED, he was found to have worsening lower extremity edema. Weight up at least 10lbs from baseline. X-ray with pulmonary vascular congestion. Hypoxia from 88-89% on room air. He is not on O2 at baseline. Otherwise, he is in atrial fibrillation without rapid ventricular response. BNP up to 178.5. He was admitted for Acute on Chronic diastolic CHF exacerbation.   Review Of Systems: Per HPI with the following additions: None Otherwise 12 point review of systems was performed and was unremarkable.  Patient Active Problem List   Diagnosis Date Noted  . Cognitive impairment 07/02/2014  . Altered mental status 06/28/2014  . Abdominal distention 06/28/2014  . Unspecified constipation 12/13/2013  . Encounter for therapeutic drug monitoring 09/15/2013  . S/P AVR 12/29/2012  . Atrial fibrillation   . Diabetic neuropathy 11/25/2011  . Excessive tearing 10/06/2011  . Diabetes mellitus 08/06/2010  . BENIGN POSITIONAL VERTIGO 06/14/2010  . VITAMIN D DEFICIENCY 02/21/2010  . Primary hyperparathyroidism 06/04/2009  . Hypercalcemia 05/29/2009  . Essential hypertension 02/23/2007  . DEPRESSION 12/15/2006  . CORONARY ARTERY DISEASE 12/15/2006  . GERD 12/15/2006  . COPD 12/10/2006  . HYPERLIPIDEMIA 07/29/2006  . Alcohol abuse 07/29/2006  . AORTIC STENOSIS 07/29/2006   Past Medical History: Past Medical History  Diagnosis Date  . Diabetes mellitus   . Depression   . Hyperlipidemia   . Hypertension   . CAD (coronary artery disease) 2012    [place]-LAD  . GERD (gastroesophageal reflux disease)   . S/P AVR     #25 mm Edwards pericardial valve  . Atrial fibrillation   . HEART FAILURE, CONGESTIVE UNSPEC 02/23/2007    Qualifier: Diagnosis of  By: Mellody Drown MD, [facility]    . TOBACCO DEPENDENCE 07/29/2006    Qualifier: History of  By: Carlena Sax  MD, [place] [place]     Past Surgical History: Past Surgical History  Procedure Laterality Date  . Coronary artery bypass graft    . Aortic valve replacement    .  Coronary angioplasty with stent placement     Social History: History  Substance Use Topics  . Smoking status: Former Smoker    Quit date: 07/08/2004  . Smokeless tobacco: Never Used  . Alcohol Use: No   Additional social history: None  Please also refer to relevant sections of EMR.  Family History: Family History  Problem Relation Age of Onset  . Heart disease Mother    Allergies and Medications: Allergies  Allergen Reactions  . Pineapple Concentrate Nausea And Vomiting   No current facility-administered medications on file prior to encounter.   Current Outpatient Prescriptions on File Prior to Encounter  Medication Sig Dispense Refill  . atorvastatin (LIPITOR) 20 MG tablet Take 1 tablet (20 mg total) by mouth daily at 6 PM. 90 tablet 3  . benazepril (LOTENSIN) 20 MG tablet Take 1 tablet (20 mg total) by mouth daily. 90 tablet 3  . budesonide-formoterol (SYMBICORT) 80-4.5 MCG/ACT inhaler Inhale 2 puffs into the lungs 2 (two) times daily. 10.2 g 11  .  cholecalciferol (VITAMIN D) 1000 UNITS tablet Take 1 tablet (1,000 Units total) by mouth daily. 90 tablet 3  . furosemide (LASIX) 40 MG tablet Take 1 tablet (40 mg total) by mouth daily. 90 tablet 3  . glipiZIDE (GLUCOTROL) 10 MG tablet Take 1 tablet (10 mg total) by mouth at bedtime. 90 tablet 3  . nitroGLYCERIN (NITROSTAT) 0.4 MG SL tablet Place 1 tablet (0.4 mg total) under the tongue every 5 (five) minutes as needed. For chest pain 30 tablet 3  . potassium chloride SA (K-DUR,KLOR-CON) 20 MEQ tablet Take 1 tablet (20 mEq total) by mouth daily. 90 tablet 3  . vitamin C (ASCORBIC ACID) 500 MG tablet Take 1 tablet (500 mg total) by mouth at bedtime. 90 tablet 3  . warfarin (COUMADIN) 5 MG tablet Take 1 tablet (5 mg total) by mouth as directed. 45 tablet 3    Objective: BP 146/84 mmHg  Pulse 91  Temp(Src) 98.4 F (36.9 C) (Oral)  Resp 26  Ht 5\' 9"  (1.753 m)  SpO2 96% Exam: General: NAD, AAOx3  HEENT: NCAT, PERRLA, No LAD,  MMM Cardiovascular: RRR, No MGR, Normal S1/S2 Respiratory: Crackles diffusely through lower and mid lung fields, Appropriate rate, unlabored, [place] in place, no wheezes Abdomen: NT, +BS, Taught and distended, no pitting edema noted.  Extremities: WWP, 2+ distal pulses, 2+ lower extremity edema.  Skin: WWP, No rashes, no lesions.  Neuro: No focal deficits.   Labs and Imaging: CBC BMET   Recent Labs Lab 08/02/14 1447  WBC 6.0  HGB 14.9  HCT 47.4  PLT 126*    Recent Labs Lab 08/02/14 1501  NA 142  K 4.5  CL 105  CO2 34*  BUN 18  CREATININE 0.95  GLUCOSE 136*  CALCIUM 9.7     BNP - 178  Istat troponin - 0  EKG - Atrial fibrillation without acute changes otherwise.   INR - 1.31  CXR 3/3:  IMPRESSION: Cardiomegaly with pulmonary vascular congestion and interstitial edema compatible with mild CHF.  Aquilla Hacker, MD 08/02/2014, 6:22 PM PGY-1, [place] [facility] pager: [phone], text pages welcome  I have seen and evaluated the above patient.  I agree with the note in its entirety.  [facility] PGY-3 Pager: [phone]

## 2014-08-02 NOTE — Consult Note (Signed)
ANTICOAGULATION CONSULT NOTE - Initial Consult  Pharmacy Consult for Coumadin Indication: afib, AVR  Allergies  Allergen Reactions  . Pineapple Concentrate Nausea And Vomiting    Patient Measurements: Height: 5\' 9"  (175.3 cm) IBW/kg (Calculated) : 70.7  Vital Signs: Temp: 98.4 F (36.9 C) (03/03 1716) Temp Source: Oral (03/03 1716) BP: 119/68 mmHg (03/03 1945) Pulse Rate: 82 (03/03 1945)  Labs:  Recent Labs  08/02/14 1447 08/02/14 1501 08/02/14 1534  HGB 14.9  --   --   HCT 47.4  --   --   PLT 126*  --   --   LABPROT  --   --  16.4*  INR  --   --  1.31  CREATININE  --  0.95  --     CrCl cannot be calculated (Unknown ideal weight.).   Medical History: Past Medical History  Diagnosis Date  . Diabetes mellitus   . Depression   . Hyperlipidemia   . Hypertension   . CAD (coronary artery disease) 2012    [place]-LAD  . GERD (gastroesophageal reflux disease)   . S/P AVR     #25 mm Edwards pericardial valve  . Atrial fibrillation   . HEART FAILURE, CONGESTIVE UNSPEC 02/23/2007    Qualifier: Diagnosis of  By: Mellody Drown MD, [facility]    . TOBACCO DEPENDENCE 07/29/2006    Qualifier: History of  By: Carlena Sax  MD, [place]     Assessment: 73yom on coumadin pta for afib/AVR presents to the ED with right lower extremity swelling and SOB. He will be admitted for CHF exacerbation and coumadin to resume. INR on admission is below goal at 1.31.   Home dose: 5mg  daily - last taken 3/2  Goal of Therapy:  INR 2-3 Monitor platelets by anticoagulation protocol: Yes   Plan:  1) Coumadin 7.5mg  x 1 tonight 2) Daily INR  Deboraha Sprang 08/02/2014,8:06 PM

## 2014-08-02 NOTE — ED Notes (Signed)
Dinner tray ordered.

## 2014-08-02 NOTE — Progress Notes (Signed)
Admitted pt from ED AAOX3. Oriented to room and call bell. VS taken and recorded. TELE on. afib rhythm noted.

## 2014-08-02 NOTE — ED Provider Notes (Signed)
CSN: [id number]     Arrival date & time 08/02/14  1438 History   First MD Initiated Contact with Patient 08/02/14 1500     Chief Complaint  Patient presents with  . Leg Swelling     (Consider location/radiation/quality/duration/timing/severity/associated sxs/prior Treatment) HPI   74 year old male with past medical history of hypertension, hyperlipidemia, diabetes, coronary artery disease, ischemic cardiomyopathy, as well as history of aortic valve replacement, on Coumadin, as well as atrial fibrillation who presents with a several day history of progressively worsening lower extremity edema and shortness of breath. Per the family's report, they're concerned that the patient has not been taking his Lasix and other medications as prescribed. They state that over the last several days, his home health nurse has noticed increasing swelling of his bilateral legs. He has also had increasing cough, nonproductive, and shortness of breath and is having to sit up more often to catch his breath. He also notes decreased exercise tolerance due to shortness of breath. He denies any chest pain. He does endorse abdominal swelling but denies any abdominal pain, nausea, vomiting or diarrhea. He does admit that he is been eating high salt foods and has not been compliant with the heart failure diet.   Past Medical History  Diagnosis Date  . Diabetes mellitus   . Depression   . Hyperlipidemia   . Hypertension   . CAD (coronary artery disease) 2012    [place]-LAD  . GERD (gastroesophageal reflux disease)   . S/P AVR     #25 mm Edwards pericardial valve  . Atrial fibrillation   . HEART FAILURE, CONGESTIVE UNSPEC 02/23/2007    Qualifier: Diagnosis of  By: Mellody Drown MD, [facility]    . TOBACCO DEPENDENCE 07/29/2006    Qualifier: History of  By: Carlena Sax  MD, [place]     Past Surgical History  Procedure Laterality Date  . Coronary artery bypass graft    . Aortic valve replacement    . Coronary angioplasty with stent  placement     Family History  Problem Relation Age of Onset  . Heart disease Mother    History  Substance Use Topics  . Smoking status: Former Smoker    Quit date: 07/08/2004  . Smokeless tobacco: Never Used  . Alcohol Use: No    Review of Systems  Constitutional: Positive for fatigue. Negative for fever, chills and diaphoresis.  HENT: Negative for congestion, rhinorrhea and sore throat.   Respiratory: Positive for cough and shortness of breath. Negative for wheezing.   Cardiovascular: Positive for leg swelling. Negative for chest pain.  Gastrointestinal: Positive for abdominal distention. Negative for nausea, vomiting, abdominal pain and diarrhea.  Genitourinary: Negative for flank pain.  Musculoskeletal: Negative for neck pain and neck stiffness.  Skin: Negative for rash.  Allergic/Immunologic: Negative for immunocompromised state.  Neurological: Negative for dizziness, weakness, light-headedness and headaches.      Allergies  Pineapple concentrate  Home Medications   Prior to Admission medications   Medication Sig Start Date End Date Taking? Authorizing Provider  atorvastatin (LIPITOR) 20 MG tablet Take 1 tablet (20 mg total) by mouth daily at 6 PM. 12/13/13   Donnamae Jude, MD  benazepril (LOTENSIN) 20 MG tablet Take 1 tablet (20 mg total) by mouth daily. 12/13/13   Donnamae Jude, MD  budesonide-formoterol (SYMBICORT) 80-4.5 MCG/ACT inhaler Inhale 2 puffs into the lungs 2 (two) times daily. 12/13/13   Donnamae Jude, MD  cholecalciferol (VITAMIN D) 1000 UNITS tablet Take 1 tablet (1,000 Units  total) by mouth daily. 12/13/13   Donnamae Jude, MD  furosemide (LASIX) 40 MG tablet Take 1 tablet (40 mg total) by mouth daily. 12/13/13   Donnamae Jude, MD  glipiZIDE (GLUCOTROL) 10 MG tablet Take 1 tablet (10 mg total) by mouth at bedtime. 12/13/13   Donnamae Jude, MD  nitroGLYCERIN (NITROSTAT) 0.4 MG SL tablet Place 1 tablet (0.4 mg total) under the tongue every 5 (five) minutes as  needed. For chest pain 12/13/13   Donnamae Jude, MD  potassium chloride SA (K-DUR,KLOR-CON) 20 MEQ tablet Take 1 tablet (20 mEq total) by mouth daily. 12/13/13   Donnamae Jude, MD  vitamin C (ASCORBIC ACID) 500 MG tablet Take 1 tablet (500 mg total) by mouth at bedtime. 12/13/13   Donnamae Jude, MD  warfarin (COUMADIN) 5 MG tablet Take 1 tablet (5 mg total) by mouth as directed. 09/01/13   Peter M Martinique, MD   BP 161/86 mmHg  Pulse 93  Temp(Src) 98.6 F (37 C) (Oral)  Resp 24  Ht 5\' 9"  (1.753 m)  SpO2 90% Physical Exam  Constitutional: He is oriented to person, place, and time. He appears well-developed and well-nourished. No distress.  HENT:  Head: Normocephalic and atraumatic.  Mouth/Throat: No oropharyngeal exudate.  Eyes: Conjunctivae are normal.  Neck: Neck supple. No JVD present.  Cardiovascular: Normal rate and intact distal pulses.  Exam reveals no friction rub.   Murmur (soft, systolic) heard. Pulmonary/Chest: Tachypnea noted. No respiratory distress. He has no wheezes. He has rales (Bibasilar).  Abdominal: Soft. He exhibits distension. There is no tenderness. There is no rebound and no guarding.  Musculoskeletal: He exhibits edema (2+ pitting edema bilaterally).  Neurological: He is alert and oriented to person, place, and time.  Skin: Skin is warm. No rash noted.  Nursing note and vitals reviewed.   ED Course  Procedures (including critical care time) Labs Review Labs Reviewed  BRAIN NATRIURETIC PEPTIDE - Abnormal; Notable for the following:    B Natriuretic Peptide 178.5 (*)    All other components within normal limits  CBC - Abnormal; Notable for the following:    Platelets 126 (*)    All other components within normal limits  COMPREHENSIVE METABOLIC PANEL - Abnormal; Notable for the following:    CO2 34 (*)    Glucose, Bld 136 (*)    GFR calc non Af Amer 82 (*)    Anion gap 3 (*)    All other components within normal limits  PROTIME-INR - Abnormal; Notable for  the following:    Prothrombin Time 16.4 (*)    All other components within normal limits  GLUCOSE, CAPILLARY - Abnormal; Notable for the following:    Glucose-Capillary 122 (*)    All other components within normal limits  CBG MONITORING, ED - Abnormal; Notable for the following:    Glucose-Capillary 108 (*)    All other components within normal limits  MAGNESIUM  TSH  TROPONIN I  PROTIME-INR  BASIC METABOLIC PANEL  TROPONIN I  TROPONIN I  CBC  TROPONIN I  Randolm Idol, ED    Imaging Review Dg Chest 2 View  08/02/2014   CLINICAL DATA:  Diabetes. Hypertension. Cough and fever. Heart failure. CHF.  EXAM: CHEST  2 VIEW  COMPARISON:  08/19/2010.  FINDINGS: Increase cardiomegaly compared to prior. There is also pulmonary vascular congestion. Interstitial pulmonary edema is also present. Median sternotomy. No pleural effusions. No focal consolidation.  IMPRESSION: Cardiomegaly with pulmonary vascular congestion  and interstitial edema compatible with mild CHF.   Electronically Signed   By: Dereck Ligas M.D.   On: 08/02/2014 15:37     EKG Interpretation   Date/Time:  Thursday August 02 2014 14:53:31 EST Ventricular Rate:  80 PR Interval:    QRS Duration: 84 QT Interval:  354 QTC Calculation: 408 R Axis:   60 Text Interpretation:  Atrial fibrillation Possible Anterior infarct , age  undetermined Abnormal ECG Nonspecific T wave abnormality Confirmed by  Wyvonnia Dusky  MD, STEPHEN [phone]) on 08/02/2014 3:34:13 PM      MDM   74 year old male with past medical history of hypertension, hyperlipidemia, diabetes, coronary artery disease, ischemic cardiomyopathy, as well as history of aortic valve replacement, on Coumadin, as well as atrial fibrillation who presents with a several day history of progressively worsening lower extremity edema and shortness of breath. See HPI above. On arrival, T 98.4F< hR 93, RR 24, BP 161/86, satting 89-90% on RA. Exam as above, remarkable for bibasilar rales  and 2+ pitting edema bilaterally.  Pt's presentation is most c/w acute on chronic CHF exacerbation, likely 2/2 poor adherence to low-salt diet and lasix at home. Pt is satting only 89-90% on RA, which improved with 2L [place]. DDx includes renal failure, liver failure, hypoalbuminemia, hyponatremia. Will send broad labs, CXR, and plan for likely admission. EKG shows no acute ischemia or infarct.  Labs as above. CBC with no leukocytosis or anemia. CMP with no renal failure, hepatitis, or other acute abnormality. BNP mildly elevated at 179. CXR c/w pulmonary edema. Will give 40 mg IV lasix and admit for diuresis and management of acute CHF exacerbation wit pulmonary edema.  Clinical Impression: 1. Acute on chronic congestive heart failure with left ventricular diastolic dysfunction   2. Aortic valve disorder     Disposition: Admit  Condition: Stable  Pt seen in conjunction with Dr. Everitt Amber, MD 08/03/14 [place], MD 08/03/14 [id number]

## 2014-08-03 ENCOUNTER — Encounter (HOSPITAL_COMMUNITY): Payer: Self-pay | Admitting: Family Medicine

## 2014-08-03 ENCOUNTER — Telehealth: Payer: Self-pay | Admitting: Family Medicine

## 2014-08-03 DIAGNOSIS — I482 Chronic atrial fibrillation, unspecified: Secondary | ICD-10-CM | POA: Insufficient documentation

## 2014-08-03 DIAGNOSIS — I5033 Acute on chronic diastolic (congestive) heart failure: Principal | ICD-10-CM

## 2014-08-03 DIAGNOSIS — E1142 Type 2 diabetes mellitus with diabetic polyneuropathy: Secondary | ICD-10-CM

## 2014-08-03 DIAGNOSIS — I509 Heart failure, unspecified: Secondary | ICD-10-CM

## 2014-08-03 DIAGNOSIS — E118 Type 2 diabetes mellitus with unspecified complications: Secondary | ICD-10-CM

## 2014-08-03 LAB — PROTIME-INR
INR: 1.51 — ABNORMAL HIGH (ref 0.00–1.49)
Prothrombin Time: 18.4 seconds — ABNORMAL HIGH (ref 11.6–15.2)

## 2014-08-03 LAB — CBC
HCT: 46.8 % (ref 39.0–52.0)
Hemoglobin: 14.4 g/dL (ref 13.0–17.0)
MCH: 28.6 pg (ref 26.0–34.0)
MCHC: 30.8 g/dL (ref 30.0–36.0)
MCV: 92.9 fL (ref 78.0–100.0)
Platelets: 122 10*3/uL — ABNORMAL LOW (ref 150–400)
RBC: 5.04 MIL/uL (ref 4.22–5.81)
RDW: 14.3 % (ref 11.5–15.5)
WBC: 5.6 10*3/uL (ref 4.0–10.5)

## 2014-08-03 LAB — TROPONIN I
Troponin I: <0.03 ng/mL (ref <0.031)
Troponin I: <0.03 ng/mL (ref <0.031)

## 2014-08-03 LAB — BASIC METABOLIC PANEL
Anion gap: 5 (ref 5–15)
BUN: 16 mg/dL (ref 6–23)
CO2: 37 mmol/L — ABNORMAL HIGH (ref 19–32)
Calcium: 9.9 mg/dL (ref 8.4–10.5)
Chloride: 99 mmol/L (ref 96–112)
Creatinine, Ser: 1.07 mg/dL (ref 0.50–1.35)
GFR calc Af Amer: 79 mL/min — ABNORMAL LOW (ref >90)
GFR calc non Af Amer: 68 mL/min — ABNORMAL LOW (ref >90)
Glucose, Bld: 130 mg/dL — ABNORMAL HIGH (ref 70–99)
Potassium: 4 mmol/L (ref 3.5–5.1)
Sodium: 141 mmol/L (ref 135–145)

## 2014-08-03 LAB — GLUCOSE, CAPILLARY
Glucose-Capillary: 131 mg/dL — ABNORMAL HIGH (ref 70–99)
Glucose-Capillary: 153 mg/dL — ABNORMAL HIGH (ref 70–99)

## 2014-08-03 MED ORDER — IPRATROPIUM BROMIDE 0.02 % IN SOLN
0.5000 mg | Freq: Four times a day (QID) | RESPIRATORY_TRACT | Status: DC
  Administered 2014-08-03 – 2014-08-04 (×2): 0.5 mg via RESPIRATORY_TRACT
  Filled 2014-08-03 (×3): qty 2.5

## 2014-08-03 MED ORDER — WARFARIN SODIUM 7.5 MG PO TABS
7.5000 mg | ORAL_TABLET | Freq: Once | ORAL | Status: AC
  Administered 2014-08-03: 7.5 mg via ORAL
  Filled 2014-08-03: qty 1

## 2014-08-03 NOTE — Progress Notes (Signed)
  Echocardiogram 2D Echocardiogram has been performed.  Matthew Edwards 08/03/2014, 2:36 PM

## 2014-08-03 NOTE — Evaluation (Addendum)
Occupational Therapy Evaluation Patient Details Name: Matthew Edwards MRN: [id number] DOB: [birth year] Today's Date: 08/03/2014    History of Present Illness Pt is a 74 y.o. Male admitted 08/02/14 with SOB for 5 days. PMH: diastolic CHF, HLD, alcohol abuse, essential HTN, CAD s/p stent in LAD and L Circumflex, COPD, GERD, DM 2, afib, aortic valve replacement, chronic anticoagulation.   Clinical Impression   PTA pt lived at home and reports that he had assistance for bathing from an aide and ambulated without DME. Pt with some slow processing, but suspect this is baseline. Pt required min A for functional mobility due to balance deficits, but is likely at or near baseline with ADLs. No further acute OT needs. Of note: pt SPO2 89% sitting on RA and HR 42, after standing at sink, pt SPO2 86% and HR 95 on RA.    Follow Up Recommendations  No OT follow up    Equipment Recommendations  None recommended by OT    Recommendations for Other Services       Precautions / Restrictions Restrictions Weight Bearing Restrictions: No      Mobility Bed Mobility               General bed mobility comments: Pt sitting up in recliner when OT arrived.   Transfers Overall transfer level: Needs assistance   Transfers: Sit to/from Stand Sit to Stand: Min guard         General transfer comment: Initially min A with hand held assist, but min guard overall.          ADL Overall ADL's : Needs assistance/impaired Eating/Feeding: Independent;Sitting   Grooming: Min guard;Wash/dry hands;Wash/dry face;Oral care;Standing   Upper Body Bathing: Set up;Sitting   Lower Body Bathing: Minimal assistance;Sit to/from stand   Upper Body Dressing : Set up;Sitting   Lower Body Dressing: Minimal assistance;Sit to/from stand   Toilet Transfer: Ambulation;Min guard   Toileting- Water quality scientist and Hygiene: Sit to/from stand;Min guard       Functional mobility during ADLs: Minimal  assistance General ADL Comments: Min A for balance when ambulating. Min guard for standing balance. Pt is likely at or near baseline.      Vision Additional Comments: No apparent deficits.        Hand Dominance Right   Extremity/Trunk Assessment Upper Extremity Assessment Upper Extremity Assessment: Overall WFL for tasks assessed   Lower Extremity Assessment Lower Extremity Assessment: Overall WFL for tasks assessed   Cervical / Trunk Assessment Cervical / Trunk Assessment: Normal   Communication Communication Communication: No difficulties   Cognition Arousal/Alertness: Awake/alert Behavior During Therapy: WFL for tasks assessed/performed Overall Cognitive Status: No family/caregiver present to determine baseline cognitive functioning Area of Impairment: Problem solving             Problem Solving: Slow processing;Difficulty sequencing;Requires verbal cues;Requires tactile cues                Home Living Family/patient expects to be discharged to:: Private residence Living Arrangements: Alone Available Help at Discharge: Personal care attendant;Other (Comment) (reports that he has aides and therapists)                                    Prior Functioning/Environment Level of Independence: Needs assistance  Gait / Transfers Assistance Needed: independent with ambulation per pt report ADL's / Homemaking Assistance Needed: pt reports that he has an aide who comes "every  2 or 3 days" to bathe him. Pt has supervision for tub transfer. Pt reports independence with LB ADLs.         OT Diagnosis: Generalized weakness    End of Session Equipment Utilized During Treatment: Gait belt  Activity Tolerance: Patient tolerated treatment well Patient left: in chair;with call bell/phone within reach   Time: 1130-1158 OT Time Calculation (min): 28 min Charges:  OT General Charges $OT Visit: 1 Procedure OT Evaluation $Initial OT Evaluation Tier I: 1  Procedure OT Treatments $Self Care/Home Management : 8-22 mins G-Codes:    Villa Herb M 08/04/2014, 12:19 PM  Cyndie Chime, OTR/L Occupational Therapist [phone] (pager)

## 2014-08-03 NOTE — Evaluation (Addendum)
Physical Therapy Evaluation Patient Details Name: Matthew Edwards MRN: [id number] DOB: Mar 04, 1941 Today's Date: 08/03/2014   History of Present Illness  Pt is a 74 y.o. Male admitted 08/02/14 with SOB for 5 days. PMH: diastolic CHF, HLD, alcohol abuse, essential HTN, CAD s/p stent in LAD and L Circumflex, COPD, GERD, DM 2, afib, aortic valve replacement, chronic anticoagulation.  Clinical Impression  Patient presents with problems listed below.  Will benefit from acute PT to maximize independence prior to discharge home.  Recommend continued HHPT and Aide at discharge.    Follow Up Recommendations Home health PT;  Home Health Aide;  Supervision - Intermittent    Equipment Recommendations  None recommended by PT    Recommendations for Other Services       Precautions / Restrictions Precautions Precautions: None Restrictions Weight Bearing Restrictions: No      Mobility  Bed Mobility                  Transfers Overall transfer level: Needs assistance Equipment used: Rolling walker (2 wheeled);None Transfers: Sit to/from Stand Sit to Stand: Supervision         General transfer comment: Verbal cues for hand placement.  Supervision only with RW.  Ambulation/Gait Ambulation/Gait assistance: Supervision Ambulation Distance (Feet): 84 Feet Assistive device: Rolling walker (2 wheeled);None Gait Pattern/deviations: Step-through pattern;Decreased stride length;Trunk flexed Gait velocity: Slightly decreased Gait velocity interpretation: Below normal speed for age/gender General Gait Details: Verbal cues for safe use of RW.  Patient with flexed posture, and difficulty maneuvering RW.  Patient ambulated last 15' with no assistive devise with good balance and upright posture.  Stairs            Wheelchair Mobility    Modified Rankin (Stroke Patients Only)       Balance                                             Pertinent Vitals/Pain Pain  Assessment: No/denies pain    Home Living Family/patient expects to be discharged to:: Private residence Living Arrangements: Alone Available Help at Discharge: Personal care attendant;Available PRN/intermittently (Reports he has Home health services - Aides) Type of Home: Apartment Home Access: Level entry     Home Layout: One level Home Equipment: Walker - 2 wheels      Prior Function Level of Independence: Needs assistance   Gait / Transfers Assistance Needed: Independent with mobility and ambulation  ADL's / Homemaking Assistance Needed: Assist with bathing - see OT note        Hand Dominance   Dominant Hand: Right    Extremity/Trunk Assessment   Upper Extremity Assessment: Defer to OT evaluation           Lower Extremity Assessment: Overall WFL for tasks assessed      Cervical / Trunk Assessment: Normal  Communication   Communication: No difficulties  Cognition Arousal/Alertness: Awake/alert Behavior During Therapy: WFL for tasks assessed/performed Overall Cognitive Status: No family/caregiver present to determine baseline cognitive functioning                      General Comments      Exercises        Assessment/Plan    PT Assessment Patient needs continued PT services  PT Diagnosis Abnormality of gait   PT Problem List Decreased strength;Decreased activity tolerance;Decreased balance;Decreased  mobility;Cardiopulmonary status limiting activity;Obesity  PT Treatment Interventions Gait training;Functional mobility training;Therapeutic activities;Therapeutic exercise;Patient/family education   PT Goals (Current goals can be found in the Care Plan section) Acute Rehab PT Goals Patient Stated Goal: To go home soon PT Goal Formulation: With patient Time For Goal Achievement: 08/10/14 Potential to Achieve Goals: Good    Frequency Min 3X/week   Barriers to discharge Decreased caregiver support Patient lives alone.  Has prn assist.     Co-evaluation               End of Session Equipment Utilized During Treatment: Gait belt Activity Tolerance: Patient tolerated treatment well Patient left: in chair;with call bell/phone within reach Nurse Communication: Mobility status (Patient with question about O2)         Time: [id number] PT Time Calculation (min) (ACUTE ONLY): 14 min   Charges:   PT Evaluation $Initial PT Evaluation Tier I: 1 Procedure     PT G CodesDespina Pole 2014-08-25, 6:45 PM Carita Pian. Sanjuana Kava, [place] Pager [phone]

## 2014-08-03 NOTE — Progress Notes (Signed)
Utilization Review Completed.Donne Anon T3/08/2014

## 2014-08-03 NOTE — Consult Note (Addendum)
Matthew Edwards for Coumadin Indication: Afib  Allergies  Allergen Reactions  . Pineapple Concentrate Nausea And Vomiting   Labs:  Recent Labs  08/02/14 1447 08/02/14 1501 08/02/14 1534 08/02/14 2012 08/03/14 0230 08/03/14 0738  HGB 14.9  --   --   --  14.4  --   HCT 47.4  --   --   --  46.8  --   PLT 126*  --   --   --  122*  --   LABPROT  --   --  16.4*  --  18.4*  --   INR  --   --  1.31  --  1.51*  --   CREATININE  --  0.95  --   --  1.07  --   TROPONINI  --   --   --  <0.03 <0.03 <0.03    Estimated Creatinine Clearance: 80.2 mL/min (by C-G formula based on Cr of 1.07).   Assessment: 73yom on coumadin pta for afib/AVR presents to the ED with right lower extremity swelling and SOB. He will be admitted for CHF exacerbation and coumadin to resume. INR on admission is below goal at 1.31.   INR today = 1.51  Home dose: 5mg  daily - last taken 3/2  Goal of Therapy:  INR 2-3 Monitor platelets by anticoagulation protocol: Yes   Plan:  1) Repeat Coumadin 7.5mg  x 1 tonight 2) Daily INR 3) Add sq heparin / prophylactic Lovenox?  Discussions about transitioning to NOAC prior to admission  Thank you. Anette Guarneri, PharmD [phone]  08/03/2014,10:16 AM

## 2014-08-03 NOTE — Telephone Encounter (Signed)
Daughter called and would like her father to go to [place]. Can we put in the referral jw

## 2014-08-03 NOTE — Progress Notes (Signed)
Family Medicine Teaching Service Daily Progress Note Intern Pager: [phone]  Patient name: Matthew Edwards Medical record number: [id number] Date of birth: Oct 14, 1940 Age: 74 y.o. Gender: male  Primary Care Provider: Donnamae Jude, MD Consultants: None Code Status: Full  Pt Overview and Major Events to Date:  3/3-3/4 : Admitted with CHF exacerbation. Diuresing   Assessment and Plan: Matthew Edwards is a 74 y.o. male presenting with shortness of breath . PMH is significant for diastolic CHF, hyperlipidemia, alcohol abuse, essential hypertension, CAD, COPD, GERD, DMII, atrial fibrillation, aortic valve replacement, chronic anticoagulation.   # Acute on Chronic CHFpEF Exacerbation - Pt. Here with SOB x 5 days that has been worsening. SOB now with walking across the room. Worsening orthopnea. Poor diet compliance. Has missed multiple doses of lasix this week. Weight gain over the past month with baseline around 250 and 262 here. Known CAD with stent in LAD and LCircumflex. Last Echo 09/2012 with preserved ejection fraction. Aortic valve s/p replacement, and calcified mitral anulus. Right atrium mildly dilated, left atrium moderately dilated, moderate tricuspid regurg. BNP - 178.5. CXR - with interstitial edema and pulmonary vascular congestion. EKG - Atrial fibrillation. Troponin negative. Creatinine stable. Pitting edema to mid lower extremity bilaterally, and taught abdomen. Requiring O2 to keep sats > 90% - Admit to telemetry under Dr. McDiarmid - Strict I/ O Daily weights. Down 2lbs most likely scale related. Documented out only 900cc overnight, but patient reports 6x UOP during his stay in the ED.  - Consult HF team for FYI. Appreciate their recs.  (his cardiologist is [name]. )  - IV Lasix 40 BID titrate to diuresis.  - Trend BMETs - Echo in the am.  - Continue ACEi, Consider BB  - Heart healthy diet.  - fluid restricted to 1200cc.  - Continue home KCL  - O2 prn to keep sats >  90% - PT/OT  # Coronary Artery Disease S/P PCI - Previously had Cath with stent placed in LAD and left circumflex. On coumadin. No chest pain this admission. EKG without acute changes. In Atrial fibrillation. Istat Troponin in the ED negative.  - Continue meds as above.  - Nitrostat prn for chest pain.  - Echo in the am as above.  - Troponins negative.  - EKG x 2 without acute changes. Afib only.   # Atrial Fibrillation - Pt. Here with known a-fib and atrial enlargement on last echocardiogram. Rate controlled on his own without intervention. HR in the 70's - 80's while in the ED. On Coumadin INR 1.31.  - Telemetry for monitoring - If HR > 100 will consider BB - Warfarin per pharmacy - f/u rate control.   # History of Primary Hyperparathyroidism - Calcium normal here.  - On Vit. D at home. Continue here.   # COPD - Suspect that his symptoms are mostly related to CHF exacerbation. No wheeze on exam.  - Continue home symbicort here.  - If worsening or little improvement, Consider COPD exacerbation and treat.  - Monitor O2 sats on tele.   # HTN - Pt. Hypertensive but normotensive here.  - On Benazapril at home continue here.   # HLD - Continue Lipitor 20mg . Consider titrating up to 40mg  given risk factors.   # DMII - On glipizide at home. Last A1C > 7.7 - Continue Glipizide here.  - Monitor CBG's  - Consider adding SSI if CBG's poorly controlled.   # ETOH Abuse - In remote history. Pt. Reports last drink  8 years ago.  - CIWA here as a precaution.   # GERD - protonix here.   FEN/GI: Heart healthy diet with 1200cc fluid restriction.  Prophylaxis: Anticoagulated with coumadin. Coumadin per farm.   Disposition: Pending testing as above and diuresis.   Subjective:  No acute events overnight. Pt. Reports symptomatic improvement, and feels that his lower extremity edema has improved. He says that he would like to ambulate today. He has no other  complaints.  Objective: Temp:  [97.5 F (36.4 C)-98.6 F (37 C)] 98.3 F (36.8 C) (03/04 0603) Pulse Rate:  [27-93] 71 (03/04 0805) Resp:  [17-27] 20 (03/04 0603) BP: (119-161)/(68-97) 131/69 mmHg (03/04 0805) SpO2:  [89 %-99 %] 89 % (03/04 0844) Weight:  [259 lb 14.4 oz (117.89 kg)-261 lb 9.6 oz (118.661 kg)] 259 lb 14.4 oz (117.89 kg) (03/04 [id number]) Physical Exam: General: NAD, AAOx3 Cardiovascular: Irregularly Irregular Rhythm, Rate controlled, +distal pulses Respiratory: Crackles to the mid lung fields bilaterally, Appropriate rate, unlabored, no wheezes.  Abdomen: S, NT, Distended, but taughtness improving, +BS Extremities: WWP, 2+ edema to the ankles bilaterally, +distal pulses.   Laboratory:  Recent Labs Lab 08/02/14 1447 08/03/14 0230  WBC 6.0 5.6  HGB 14.9 14.4  HCT 47.4 46.8  PLT 126* 122*    Recent Labs Lab 08/02/14 1501 08/03/14 0230  NA 142 141  K 4.5 4.0  CL 105 99  CO2 34* 37*  BUN 18 16  CREATININE 0.95 1.07  CALCIUM 9.7 9.9  PROT 6.8  --   BILITOT 0.9  --   ALKPHOS 86  --   ALT 18  --   AST 23  --   GLUCOSE 136* 130*   Troponin - negative x 3 TSH - 1.417 Mg - 2.2  EKG - Afib.   INR - 1.51  Imaging/Diagnostic Tests: CXR 3/3:  IMPRESSION: Cardiomegaly with pulmonary vascular congestion and interstitial edema compatible with mild CHF.  Aquilla Hacker, MD 08/03/2014, 9:32 AM PGY-1, [place] Intern pager: [phone], text pages welcome

## 2014-08-04 DIAGNOSIS — I359 Nonrheumatic aortic valve disorder, unspecified: Secondary | ICD-10-CM

## 2014-08-04 DIAGNOSIS — Z7901 Long term (current) use of anticoagulants: Secondary | ICD-10-CM

## 2014-08-04 LAB — CBC WITH DIFFERENTIAL/PLATELET
Basophils Absolute: 0 10*3/uL (ref 0.0–0.1)
Basophils Relative: 0 % (ref 0–1)
Eosinophils Absolute: 0.2 10*3/uL (ref 0.0–0.7)
Eosinophils Relative: 3 % (ref 0–5)
HCT: 46.4 % (ref 39.0–52.0)
Hemoglobin: 14.6 g/dL (ref 13.0–17.0)
Lymphocytes Relative: 24 % (ref 12–46)
Lymphs Abs: 1.5 10*3/uL (ref 0.7–4.0)
MCH: 28.7 pg (ref 26.0–34.0)
MCHC: 31.5 g/dL (ref 30.0–36.0)
MCV: 91.3 fL (ref 78.0–100.0)
Monocytes Absolute: 0.6 10*3/uL (ref 0.1–1.0)
Monocytes Relative: 10 % (ref 3–12)
Neutro Abs: 3.8 10*3/uL (ref 1.7–7.7)
Neutrophils Relative %: 63 % (ref 43–77)
Platelets: 135 10*3/uL — ABNORMAL LOW (ref 150–400)
RBC: 5.08 MIL/uL (ref 4.22–5.81)
RDW: 14.4 % (ref 11.5–15.5)
WBC: 6.1 10*3/uL (ref 4.0–10.5)

## 2014-08-04 LAB — BASIC METABOLIC PANEL
Anion gap: 9 (ref 5–15)
BUN: 17 mg/dL (ref 6–23)
CO2: 30 mmol/L (ref 19–32)
Calcium: 9.6 mg/dL (ref 8.4–10.5)
Chloride: 99 mmol/L (ref 96–112)
Creatinine, Ser: 1.01 mg/dL (ref 0.50–1.35)
GFR calc Af Amer: 84 mL/min — ABNORMAL LOW (ref >90)
GFR calc non Af Amer: 73 mL/min — ABNORMAL LOW (ref >90)
Glucose, Bld: 98 mg/dL (ref 70–99)
Potassium: 4.2 mmol/L (ref 3.5–5.1)
Sodium: 138 mmol/L (ref 135–145)

## 2014-08-04 LAB — GLUCOSE, CAPILLARY
Glucose-Capillary: 116 mg/dL — ABNORMAL HIGH (ref 70–99)
Glucose-Capillary: 149 mg/dL — ABNORMAL HIGH (ref 70–99)
Glucose-Capillary: 154 mg/dL — ABNORMAL HIGH (ref 70–99)
Glucose-Capillary: 120 mg/dL — ABNORMAL HIGH (ref 70–99)

## 2014-08-04 LAB — PROTIME-INR
INR: 1.85 — ABNORMAL HIGH (ref 0.00–1.49)
Prothrombin Time: 21.5 seconds — ABNORMAL HIGH (ref 11.6–15.2)

## 2014-08-04 MED ORDER — FUROSEMIDE 10 MG/ML IJ SOLN
40.0000 mg | Freq: Once | INTRAMUSCULAR | Status: AC
  Administered 2014-08-04: 40 mg via INTRAVENOUS

## 2014-08-04 MED ORDER — WARFARIN SODIUM 7.5 MG PO TABS
7.5000 mg | ORAL_TABLET | Freq: Once | ORAL | Status: AC
  Administered 2014-08-04: 7.5 mg via ORAL
  Filled 2014-08-04: qty 1

## 2014-08-04 NOTE — Care Management Note (Signed)
CARE MANAGEMENT NOTE 08/04/2014  Patient:  KAULIN, PARROTTE   Account Number:  [id number]  Date Initiated:  08/04/2014  Documentation initiated by:  Apolonio Schneiders  Subjective/Objective Assessment:   Acute Decompensate Heart Failure     Action/Plan:   Anticipated DC Date:  08/06/2014   Anticipated DC Plan:  [place]  CM consult      [facility] Choice  Resumption Of Svcs/PTA Provider   Choice offered to / List presented to:  C-1 Patient        [place] arranged  HH-1 RN  [place] agency  [place]   Status of service:  Completed, signed off Medicare Important Message given?   (If response is "NO", the following Medicare IM given date fields will be blank) Date Medicare IM given:   Medicare IM given by:   Date Additional Medicare IM given:   Additional Medicare IM given by:    Discharge Disposition:  [place]  Per UR Regulation:    If discussed at Long Length of Stay Meetings, dates discussed:    Comments:  08/04/14 1520 - CM spoke with patient at the bedside. He's had home health care for about 2 weeks and would like to continue with his current agency. Unable to provide the name, suggested CM call his daughter [name]. Per [facility] is providing home care. Venita Sheffield RN BSN CCM [phone]

## 2014-08-04 NOTE — Progress Notes (Signed)
Family Medicine Teaching Service Daily Progress Note Intern Pager: [phone]  Patient name: Matthew Edwards Medical record number: [id number] Date of birth: [birth year] Age: 74 y.o. Gender: male  Primary Care Provider: Donnamae Jude, MD Consultants: None Code Status: Full  Pt Overview and Major Events to Date:  3/3-3/4 : Admitted with CHF exacerbation. Diuresing   Assessment and Plan: Matthew Edwards is a 74 y.o. male presenting with shortness of breath . PMH is significant for diastolic CHF, hyperlipidemia, alcohol abuse, essential hypertension, CAD, COPD, GERD, DMII, atrial fibrillation, aortic valve replacement, chronic anticoagulation.   # Acute on Chronic CHFpEF Exacerbation - Pt. Here with SOB x 5 days that has been worsening. Worsening orthopnea. Poor diet compliance. Has missed multiple doses of lasix this week. Weight gain over the past month with baseline around 250 and 262 here. Known CAD with stent in LAD and LCircumflex. Last Echo 09/2012 with preserved ejection fraction. Aortic valve s/p replacement, and calcified mitral anulus. Right atrium mildly dilated, left atrium moderately dilated, moderate tricuspid regurg. BNP - 178.5. CXR - with interstitial edema and pulmonary vascular congestion. EKG - Atrial fibrillation. Troponin negative. Creatinine stable. Pitting edema improved. No longer requiring O2 to maintain sats > 90% - Admit to telemetry under Dr. McDiarmid - Strict I/ O Daily weights. Down 4lbs.   - Poor I/O's overnight. Decreased weight above.   - FYI HF prior to d/c  (his cardiologist is [name]. )  - IV Lasix 40 BID with additional dose today.  - Echo with mild LVH, preserved EF 50-55%, Mildly dilated left atrium, elevated PA peak pressure 67mmhg. Likely pulm HTN 2/2 OSA, Chronic Afib.  - Trend BMETs - Continue ACEi, Consider BB  - Heart healthy diet.  - fluid restricted to 1200cc.  - Continue home KCL  - O2 prn to keep sats > 90% - PT/OT>> home health PT   - Dietitian referral at D/C.   # Coronary Artery Disease S/P PCI - Previously had Cath with stent placed in LAD and left circumflex. On coumadin. No chest pain this admission. EKG without acute changes. In Atrial fibrillation. Istat Troponin in the ED negative.  - Continue meds as above.  - Nitrostat prn for chest pain.  - Echo in the am as above.  - Troponins negative.  - EKG x 2 without acute changes. Afib only.   # Atrial Fibrillation - Pt. Here with known a-fib and atrial enlargement on last echocardiogram. Rate controlled on his own without intervention. HR in the 70's - 80's while in the ED. On Coumadin INR 1.31.  - Telemetry for monitoring - INR subtherapeutic. 1.8 goal > 2.5.  - If HR > 100 will consider BB - Warfarin per pharmacy.  - f/u rate control.   # History of Primary Hyperparathyroidism - Calcium normal here.  - On Vit. D at home. Continue here.   # COPD - Suspect that his symptoms are mostly related to CHF exacerbation. No wheeze on exam.  - Continue home symbicort here.  - If worsening or little improvement, Consider COPD exacerbation and treat.  - Monitor O2 sats on tele.   # HTN - Pt. Hypertensive but normotensive here.  - On Benazapril at home continue here.   # HLD - Continue Lipitor 20mg . Consider titrating up to 40mg  given risk factors.   # DMII - On glipizide at home. Last A1C > 7.7 - Continue Glipizide here.  - Monitor CBG's  - Consider adding SSI if CBG's poorly  controlled.   # ETOH Abuse - In remote history. Pt. Reports last drink 8 years ago.  - CIWA here as a precaution.   # GERD - protonix here.   FEN/GI: Heart healthy diet with 1200cc fluid restriction.  Prophylaxis: Anticoagulated with coumadin. Coumadin per farm.   Disposition: Pending diuresis  Subjective:  No acute events overnight. Voided multiple times yesterday without collection. Bowel movement 2 with voids well. Not requiring oxygen to maintain sats greater  than 90% currently. Feels much better, orthopnea improved, shortness of breath with ambulation improved. Lower extremity edema improved per patient. Abdomen much less taut.  Objective: Temp:  [97.2 F (36.2 C)-98.4 F (36.9 C)] 97.2 F (36.2 C) (03/05 0613) Pulse Rate:  [67-100] 100 (03/05 0613) Resp:  [16-18] 18 (03/05 0613) BP: (106-124)/(74-81) 119/81 mmHg (03/05 0613) SpO2:  [91 %-94 %] 94 % (03/05 0613) Weight:  [257 lb 3.2 oz (116.665 kg)] 257 lb 3.2 oz (116.665 kg) (03/05 [id number]) Physical Exam: General: NAD, AAOx3 Cardiovascular: Irregularly Irregular Rhythm, Rate controlled, +distal pulses Respiratory: Crackles only in the bases bilaterally and improved significantly, Appropriate rate, unlabored, no wheezes.  Abdomen: S, NT, Distended, but taughtness improving, +BS Extremities: WWP, 1+ edema to the ankles bilaterally also improving. , +distal pulses.   Laboratory:  Recent Labs Lab 08/02/14 1447 08/03/14 0230 08/04/14 0410  WBC 6.0 5.6 6.1  HGB 14.9 14.4 14.6  HCT 47.4 46.8 46.4  PLT 126* 122* 135*    Recent Labs Lab 08/02/14 1501 08/03/14 0230 08/04/14 0410  NA 142 141 138  K 4.5 4.0 4.2  CL 105 99 99  CO2 34* 37* 30  BUN 18 16 17   CREATININE 0.95 1.07 1.01  CALCIUM 9.7 9.9 9.6  PROT 6.8  --   --   BILITOT 0.9  --   --   ALKPHOS 86  --   --   ALT 18  --   --   AST 23  --   --   GLUCOSE 136* 130* 98   Troponin - negative x 3 TSH - 1.417 Mg - 2.2  EKG - Afib.   INR - 1.51 > 1.85  Imaging/Diagnostic Tests: CXR 3/3:  IMPRESSION: Cardiomegaly with pulmonary vascular congestion and interstitial edema compatible with mild CHF.  Echocardiogram 3/4:  Study Conclusions  - Left ventricle: The cavity size was normal. Wall thickness was increased in a pattern of mild LVH. Systolic function was normal. The estimated ejection fraction was in the range of 50% to 55%. Wall motion was normal; there were no regional wall motion abnormalities. -  Aortic valve: A bioprosthesis was present and functioning normally. Valve area (VTI): 1.95 cm^2. Valve area (Vmax): 1.89 cm^2. Valve area (Vmean): 1.92 cm^2. - Mitral valve: Calcified annulus. There was mild regurgitation. - Left atrium: The atrium was mildly dilated. - Pulmonary arteries: PA peak pressure: 58 mm Hg (S).  Aquilla Hacker, MD 08/04/2014, 8:47 AM PGY-1, [place] Intern pager: [phone], text pages welcome

## 2014-08-04 NOTE — Consult Note (Signed)
[place] for Coumadin Indication: Afib  Allergies  Allergen Reactions  . Pineapple Concentrate Nausea And Vomiting   Labs:  Recent Labs  08/02/14 1447 08/02/14 1501 08/02/14 1534 08/02/14 2012 08/03/14 0230 08/03/14 0738 08/04/14 0410  HGB 14.9  --   --   --  14.4  --  14.6  HCT 47.4  --   --   --  46.8  --  46.4  PLT 126*  --   --   --  122*  --  135*  LABPROT  --   --  16.4*  --  18.4*  --  21.5*  INR  --   --  1.31  --  1.51*  --  1.85*  CREATININE  --  0.95  --   --  1.07  --  1.01  TROPONINI  --   --   --  <0.03 <0.03 <0.03  --     Estimated Creatinine Clearance: 84.5 mL/min (by C-G formula based on Cr of 1.01).   Assessment: Matthew Edwards on coumadin pta for afib/AVR presents to the ED with right lower extremity swelling and SOB. He will be admitted for CHF exacerbation and coumadin to resume. INR on admission is below goal at 1.31.   INR today = 1.85, no bleeding issues noted. CBC stable  Home dose: 5mg  daily - last taken 3/2  Goal of Therapy:  INR 2-3 Monitor platelets by anticoagulation protocol: Yes   Plan:  1) Repeat Coumadin 7.5mg  x 1 tonight 2) Daily INR  Discussions about transitioning to NOAC prior to admission  Thank you.  Erin Hearing PharmD., BCPS Clinical Pharmacist Pager [phone] 08/04/2014 10:55 AM

## 2014-08-04 NOTE — Progress Notes (Signed)
The patient is A&O and his VS were stable overnight.  He did not have any complaints of pain or otherwise.  He was educated about using his urinal at 0600 by the RN this morning.

## 2014-08-05 DIAGNOSIS — I10 Essential (primary) hypertension: Secondary | ICD-10-CM

## 2014-08-05 DIAGNOSIS — IMO0001 Reserved for inherently not codable concepts without codable children: Secondary | ICD-10-CM | POA: Insufficient documentation

## 2014-08-05 DIAGNOSIS — J449 Chronic obstructive pulmonary disease, unspecified: Secondary | ICD-10-CM

## 2014-08-05 LAB — CBC WITH DIFFERENTIAL/PLATELET
Basophils Absolute: 0 10*3/uL (ref 0.0–0.1)
Basophils Relative: 0 % (ref 0–1)
Eosinophils Absolute: 0.2 10*3/uL (ref 0.0–0.7)
Eosinophils Relative: 3 % (ref 0–5)
HCT: 47.5 % (ref 39.0–52.0)
Hemoglobin: 15.3 g/dL (ref 13.0–17.0)
Lymphocytes Relative: 27 % (ref 12–46)
Lymphs Abs: 1.6 10*3/uL (ref 0.7–4.0)
MCH: 29.3 pg (ref 26.0–34.0)
MCHC: 32.2 g/dL (ref 30.0–36.0)
MCV: 91 fL (ref 78.0–100.0)
Monocytes Absolute: 0.5 10*3/uL (ref 0.1–1.0)
Monocytes Relative: 9 % (ref 3–12)
Neutro Abs: 3.6 10*3/uL (ref 1.7–7.7)
Neutrophils Relative %: 61 % (ref 43–77)
Platelets: 135 10*3/uL — ABNORMAL LOW (ref 150–400)
RBC: 5.22 MIL/uL (ref 4.22–5.81)
RDW: 14.6 % (ref 11.5–15.5)
WBC: 6 10*3/uL (ref 4.0–10.5)

## 2014-08-05 LAB — BASIC METABOLIC PANEL
Anion gap: 7 (ref 5–15)
BUN: 20 mg/dL (ref 6–23)
CO2: 34 mmol/L — ABNORMAL HIGH (ref 19–32)
Calcium: 9.8 mg/dL (ref 8.4–10.5)
Chloride: 97 mmol/L (ref 96–112)
Creatinine, Ser: 1.1 mg/dL (ref 0.50–1.35)
GFR calc Af Amer: 76 mL/min — ABNORMAL LOW (ref >90)
GFR calc non Af Amer: 66 mL/min — ABNORMAL LOW (ref >90)
Glucose, Bld: 95 mg/dL (ref 70–99)
Potassium: 4 mmol/L (ref 3.5–5.1)
Sodium: 138 mmol/L (ref 135–145)

## 2014-08-05 LAB — GLUCOSE, CAPILLARY
Glucose-Capillary: 135 mg/dL — ABNORMAL HIGH (ref 70–99)
Glucose-Capillary: 114 mg/dL — ABNORMAL HIGH (ref 70–99)

## 2014-08-05 LAB — PROTIME-INR
INR: 1.92 — ABNORMAL HIGH (ref 0.00–1.49)
Prothrombin Time: 22.1 seconds — ABNORMAL HIGH (ref 11.6–15.2)

## 2014-08-05 MED ORDER — ALBUTEROL SULFATE HFA 108 (90 BASE) MCG/ACT IN AERS: 2.0000 | INHALATION_SPRAY | Freq: Four times a day (QID) | RESPIRATORY_TRACT | Status: DC | PRN

## 2014-08-05 MED ORDER — FUROSEMIDE 40 MG PO TABS: 40.0000 mg | ORAL_TABLET | Freq: Two times a day (BID) | ORAL | Status: DC

## 2014-08-05 MED ORDER — BUDESONIDE-FORMOTEROL FUMARATE 80-4.5 MCG/ACT IN AERO: 2.0000 | INHALATION_SPRAY | Freq: Two times a day (BID) | RESPIRATORY_TRACT | Status: DC

## 2014-08-05 MED ORDER — ALBUTEROL SULFATE (2.5 MG/3ML) 0.083% IN NEBU: 2.5000 mg | INHALATION_SOLUTION | RESPIRATORY_TRACT | Status: DC | PRN

## 2014-08-05 MED ORDER — WARFARIN SODIUM 7.5 MG PO TABS
7.5000 mg | ORAL_TABLET | Freq: Once | ORAL | Status: DC
  Filled 2014-08-05: qty 1

## 2014-08-05 NOTE — Progress Notes (Signed)
Family Medicine Teaching Service Daily Progress Note Intern Pager: [phone]  Patient name: Matthew Edwards Medical record number: [id number] Date of birth: 03-27-1941 Age: 74 y.o. Gender: male  Primary Care Provider: Donnamae Jude, MD Consultants: None Code Status: Full  Pt Overview and Major Events to Date:  3/3-3/4 : Admitted with CHF exacerbation. Diuresing   Assessment and Plan: Matthew Edwards is a 74 y.o. male presenting with shortness of breath . PMH is significant for diastolic CHF, hyperlipidemia, alcohol abuse, essential hypertension, CAD, COPD, GERD, DMII, atrial fibrillation, aortic valve replacement, chronic anticoagulation.   # Acute on Chronic CHFpEF Exacerbation - Pt. Here with SOB x 5 days that has been worsening. Worsening orthopnea. Poor diet compliance. Has missed multiple doses of lasix this week. Weight gain over the past month with baseline around 250 and 262 here. Known CAD with stent in LAD and LCircumflex. Last Echo 09/2012 with preserved ejection fraction. Aortic valve s/p replacement, and calcified mitral anulus. Right atrium mildly dilated, left atrium moderately dilated, moderate tricuspid regurg. BNP - 178.5. CXR - with interstitial edema and pulmonary vascular congestion. EKG - Atrial fibrillation. Troponin negative. Creatinine stable. Pitting edema improved. No longer requiring O2 to maintain sats > 90% - Admit to telemetry under Dr. McDiarmid - Strict I/ O Daily weights. Down 4lbs.   - Poor I/O's overnight. Decreased weight above.    - IV Lasix 40 BID - Echo with mild LVH, preserved EF 50-55%, Mildly dilated left atrium, elevated PA peak pressure 21mmhg. Likely pulm HTN 2/2 OSA, Chronic Afib.  - Trend BMETs - Continue ACEi, Consider BB  - Heart healthy diet.  - fluid restricted to 1200cc.  - Continue home KCL  - O2 prn to keep sats > 90% - PT/OT>> home health PT   # Coronary Artery Disease S/P PCI - Previously had Cath with stent placed in LAD and  left circumflex. On coumadin. No chest pain this admission. EKG without acute changes. In Atrial fibrillation. Istat Troponin in the ED negative.  - Continue meds as above.  - Nitrostat prn for chest pain.  - Echo in the am as above.  - Troponins negative.  - EKG x 2 without acute changes. Afib only.   # Atrial Fibrillation - Pt. Here with known a-fib and atrial enlargement on last echocardiogram. Rate controlled on his own without intervention. HR in the 70's - 80's while in the ED. On Coumadin INR 1.31.  - Telemetry for monitoring - INR subtherapeutic. 1.8 goal > 2.5.  - If HR > 100 will consider BB - Warfarin per pharmacy.  - f/u rate control.   # History of Primary Hyperparathyroidism - Calcium normal here.  - On Vit. D at home. Continue here.   # COPD - Suspect that his symptoms are mostly related to CHF exacerbation. No wheeze on exam.  - Continue home symbicort here.  - If worsening or little improvement, Consider COPD exacerbation and treat.  - Monitor O2 sats on tele.   # HTN - Pt. Hypertensive but normotensive here.  - On Benazapril at home continue here.   # HLD - Continue Lipitor 20mg . Consider titrating up to 40mg  given risk factors.   # DMII - On glipizide at home. Last A1C > 7.7 - Continue Glipizide here.  - Monitor CBG's  - Consider adding SSI if CBG's poorly controlled.   # ETOH Abuse - In remote history. Pt. Reports last drink 8 years ago.  - CIWA here as a precaution.   #  GERD - protonix here.   FEN/GI: Heart healthy diet with 1200cc fluid restriction.  Prophylaxis: Anticoagulated with coumadin. Coumadin per farm.   Disposition: Home today  Subjective:  Feeling much better, breathing comfortably, edema much improved  Objective: Temp:  [97.7 F (36.5 C)-98.2 F (36.8 C)] 97.8 F (36.6 C) (03/06 0521) Pulse Rate:  [63-86] 68 (03/06 0521) Resp:  [18-20] 18 (03/06 0521) BP: (104-124)/(62-69) 121/69 mmHg (03/06 0521) SpO2:  [93 %-98  %] 98 % (03/06 0521) Weight:  [255 lb 4.7 oz (115.8 kg)] 255 lb 4.7 oz (115.8 kg) (03/06 0521) Physical Exam: General: NAD, AAOx3 Cardiovascular: Irregularly Irregular Rhythm, Rate controlled, +distal pulses Respiratory: CTAB, Appropriate rate, unlabored, no wheezes.  Abdomen: S, NT, Distended, but taughtness improving, +BS Extremities: WWP, 1+ edema to the ankles bilaterally improving.   Laboratory:  Recent Labs Lab 08/03/14 0230 08/04/14 0410 08/05/14 0420  WBC 5.6 6.1 6.0  HGB 14.4 14.6 15.3  HCT 46.8 46.4 47.5  PLT 122* 135* 135*    Recent Labs Lab 08/02/14 1501 08/03/14 0230 08/04/14 0410 08/05/14 0420  NA 142 141 138 138  K 4.5 4.0 4.2 4.0  CL 105 99 99 97  CO2 34* 37* 30 34*  BUN 18 16 17 20   CREATININE 0.95 1.07 1.01 1.10  CALCIUM 9.7 9.9 9.6 9.8  PROT 6.8  --   --   --   BILITOT 0.9  --   --   --   ALKPHOS 86  --   --   --   ALT 18  --   --   --   AST 23  --   --   --   GLUCOSE 136* 130* 98 95   Troponin - negative x 3 TSH - 1.417 Mg - 2.2  EKG - Afib.   INR - 1.51 > 1.85  Imaging/Diagnostic Tests: CXR 3/3:  IMPRESSION: Cardiomegaly with pulmonary vascular congestion and interstitial edema compatible with mild CHF.  Echocardiogram 3/4:  - Left ventricle: The cavity size was normal. Wall thickness was increased in a pattern of mild LVH. Systolic function was normal. The estimated ejection fraction was in the range of 50% to 55%. Wall motion was normal; there were no regional wall motion abnormalities. - Aortic valve: A bioprosthesis was present and functioning normally. Valve area (VTI): 1.95 cm^2. Valve area (Vmax): 1.89 cm^2. Valve area (Vmean): 1.92 cm^2. - Mitral valve: Calcified annulus. There was mild regurgitation. - Left atrium: The atrium was mildly dilated. - Pulmonary arteries: PA peak pressure: 58 mm Hg (S).  Frazier Richards, MD 08/05/2014, 8:07 AM PGY-2, [place] Intern pager: [phone], text pages welcome

## 2014-08-05 NOTE — Progress Notes (Signed)
11/05/14 1520 nsg Patient discharged to home per wheelchair accompanied by RN and daughter. Discharge instructions understood no further questions.

## 2014-08-05 NOTE — Progress Notes (Signed)
The patient was A&Ox4 and his VS were stable overnight.  He did not have any complaints of pain or otherwise overnight.

## 2014-08-05 NOTE — Discharge Instructions (Signed)
You were admitted for worsening of your heart failure. It is very important that you take your medicines every day and limit salt and fluid in your diet.   Heart Failure Heart failure is a condition in which the heart has trouble pumping blood. This means your heart does not pump blood efficiently for your body to work well. In some cases of heart failure, fluid may back up into your lungs or you may have swelling (edema) in your lower legs. Heart failure is usually a long-term (chronic) condition. It is important for you to take good care of yourself and follow your health care provider's treatment plan. CAUSES  Some health conditions can cause heart failure. Those health conditions include:  High blood pressure (hypertension). Hypertension causes the heart muscle to work harder than normal. When pressure in the blood vessels is high, the heart needs to pump (contract) with more force in order to circulate blood throughout the body. High blood pressure eventually causes the heart to become stiff and weak.  Coronary artery disease (CAD). CAD is the buildup of cholesterol and fat (plaque) in the arteries of the heart. The blockage in the arteries deprives the heart muscle of oxygen and blood. This can cause chest pain and may lead to a heart attack. High blood pressure can also contribute to CAD.  Heart attack (myocardial infarction). A heart attack occurs when one or more arteries in the heart become blocked. The loss of oxygen damages the muscle tissue of the heart. When this happens, part of the heart muscle dies. The injured tissue does not contract as well and weakens the heart's ability to pump blood.  Abnormal heart valves. When the heart valves do not open and close properly, it can cause heart failure. This makes the heart muscle pump harder to keep the blood flowing.  Heart muscle disease (cardiomyopathy or myocarditis). Heart muscle disease is damage to the heart muscle from a variety of  causes. These can include drug or alcohol abuse, infections, or unknown reasons. These can increase the risk of heart failure.  Lung disease. Lung disease makes the heart work harder because the lungs do not work properly. This can cause a strain on the heart, leading it to fail.  Diabetes. Diabetes increases the risk of heart failure. High blood sugar contributes to high fat (lipid) levels in the blood. Diabetes can also cause slow damage to tiny blood vessels that carry important nutrients to the heart muscle. When the heart does not get enough oxygen and food, it can cause the heart to become weak and stiff. This leads to a heart that does not contract efficiently.  Other conditions can contribute to heart failure. These include abnormal heart rhythms, thyroid problems, and low blood counts (anemia). Certain unhealthy behaviors can increase the risk of heart failure, including:  Being overweight.  Smoking or chewing tobacco.  Eating foods high in fat and cholesterol.  Abusing illicit drugs or alcohol.  Lacking physical activity. SYMPTOMS  Heart failure symptoms may vary and can be hard to detect. Symptoms may include:  Shortness of breath with activity, such as climbing stairs.  Persistent cough.  Swelling of the feet, ankles, legs, or abdomen.  Unexplained weight gain.  Difficulty breathing when lying flat (orthopnea).  Waking from sleep because of the need to sit up and get more air.  Rapid heartbeat.  Fatigue and loss of energy.  Feeling light-headed, dizzy, or close to fainting.  Loss of appetite.  Nausea.  Increased urination  during the night (nocturia). DIAGNOSIS  A diagnosis of heart failure is based on your history, symptoms, physical examination, and diagnostic tests. Diagnostic tests for heart failure may include:  Echocardiography.  Electrocardiography.  Chest X-ray.  Blood tests.  Exercise stress test.  Cardiac angiography.  Radionuclide  scans. TREATMENT  Treatment is aimed at managing the symptoms of heart failure. Medicines, behavioral changes, or surgical intervention may be necessary to treat heart failure.  Medicines to help treat heart failure may include:  Angiotensin-converting enzyme (ACE) inhibitors. This type of medicine blocks the effects of a blood protein called angiotensin-converting enzyme. ACE inhibitors relax (dilate) the blood vessels and help lower blood pressure.  Angiotensin receptor blockers (ARBs). This type of medicine blocks the actions of a blood protein called angiotensin. Angiotensin receptor blockers dilate the blood vessels and help lower blood pressure.  Water pills (diuretics). Diuretics cause the kidneys to remove salt and water from the blood. The extra fluid is removed through urination. This loss of extra fluid lowers the volume of blood the heart pumps.  Beta blockers. These prevent the heart from beating too fast and improve heart muscle strength.  Digitalis. This increases the force of the heartbeat.  Healthy behavior changes include:  Obtaining and maintaining a healthy weight.  Stopping smoking or chewing tobacco.  Eating heart-healthy foods.  Limiting or avoiding alcohol.  Stopping illicit drug use.  Physical activity as directed by your health care provider.  Surgical treatment for heart failure may include:  A procedure to open blocked arteries, repair damaged heart valves, or remove damaged heart muscle tissue.  A pacemaker to improve heart muscle function and control certain abnormal heart rhythms.  An internal cardioverter defibrillator to treat certain serious abnormal heart rhythms.  A left ventricular assist device (LVAD) to assist the pumping ability of the heart. HOME CARE INSTRUCTIONS   Take medicines only as directed by your health care provider. Medicines are important in reducing the workload of your heart, slowing the progression of heart failure, and  improving your symptoms.  Do not stop taking your medicine unless directed by your health care provider.  Do not skip any dose of medicine.  Refill your prescriptions before you run out of medicine. Your medicines are needed every day.  Engage in moderate physical activity if directed by your health care provider. Moderate physical activity can benefit some people. The elderly and people with severe heart failure should consult with a health care provider for physical activity recommendations.  Eat heart-healthy foods. Food choices should be free of trans fat and low in saturated fat, cholesterol, and salt (sodium). Healthy choices include fresh or frozen fruits and vegetables, fish, lean meats, legumes, fat-free or low-fat dairy products, and whole grain or high fiber foods. Talk to a dietitian to learn more about heart-healthy foods.  Limit sodium if directed by your health care provider. Sodium restriction may reduce symptoms of heart failure in some people. Talk to a dietitian to learn more about heart-healthy seasonings.  Use healthy cooking methods. Healthy cooking methods include roasting, grilling, broiling, baking, poaching, steaming, or stir-frying. Talk to a dietitian to learn more about healthy cooking methods.  Limit fluids if directed by your health care provider. Fluid restriction may reduce symptoms of heart failure in some people.  Weigh yourself every day. Daily weights are important in the early recognition of excess fluid. You should weigh yourself every morning after you urinate and before you eat breakfast. Wear the same amount of clothing each  time you weigh yourself. Record your daily weight. Provide your health care provider with your weight record.  Monitor and record your blood pressure if directed by your health care provider.  Check your pulse if directed by your health care provider.  Lose weight if directed by your health care provider. Weight loss may reduce  symptoms of heart failure in some people.  Stop smoking or chewing tobacco. Nicotine makes your heart work harder by causing your blood vessels to constrict. Do not use nicotine gum or patches before talking to your health care provider.  Keep all follow-up visits as directed by your health care provider. This is important.  Limit alcohol intake to no more than 1 drink per day for nonpregnant women and 2 drinks per day for men. One drink equals 12 ounces of beer, 5 ounces of wine, or 1 ounces of hard liquor. Drinking more than that is harmful to your heart. Tell your health care provider if you drink alcohol several times a week. Talk with your health care provider about whether alcohol is safe for you. If your heart has already been damaged by alcohol or you have severe heart failure, drinking alcohol should be stopped completely.  Stop illicit drug use.  Stay up-to-date with immunizations. It is especially important to prevent respiratory infections through current pneumococcal and influenza immunizations.  Manage other health conditions such as hypertension, diabetes, thyroid disease, or abnormal heart rhythms as directed by your health care provider.  Learn to manage stress.  Plan rest periods when fatigued.  Learn strategies to manage high temperatures. If the weather is extremely hot:  Avoid vigorous physical activity.  Use air conditioning or fans or seek a cooler location.  Avoid caffeine and alcohol.  Wear loose-fitting, lightweight, and light-colored clothing.  Learn strategies to manage cold temperatures. If the weather is extremely cold:  Avoid vigorous physical activity.  Layer clothes.  Wear mittens or gloves, a hat, and a scarf when going outside.  Avoid alcohol.  Obtain ongoing education and support as needed.  Participate in or seek rehabilitation as needed to maintain or improve independence and quality of life. SEEK MEDICAL CARE IF:   Your weight  increases by 03 lb/1.4 kg in 1 day or 05 lb/2.3 kg in a week.  You have increasing shortness of breath that is unusual for you.  You are unable to participate in your usual physical activities.  You tire easily.  You cough more than normal, especially with physical activity.  You have any or more swelling in areas such as your hands, feet, ankles, or abdomen.  You are unable to sleep because it is hard to breathe.  You feel like your heart is beating fast (palpitations).  You become dizzy or light-headed upon standing up. SEEK IMMEDIATE MEDICAL CARE IF:   You have difficulty breathing.  There is a change in mental status such as decreased alertness or difficulty with concentration.  You have a pain or discomfort in your chest.  You have an episode of fainting (syncope). MAKE SURE YOU:   Understand these instructions.  Will watch your condition.  Will get help right away if you are not doing well or get worse. Document Released: 05/18/2005 Document Revised: 10/02/2013 Document Reviewed: 06/17/2012 [facility] Patient Information 2015 [place], [place]. This information is not intended to replace advice given to you by your health care provider. Make sure you discuss any questions you have with your health care provider.

## 2014-08-05 NOTE — Consult Note (Signed)
[place] for Coumadin Indication: Afib  Allergies  Allergen Reactions  . Pineapple Concentrate Nausea And Vomiting   Labs:  Recent Labs  08/02/14 2012 08/03/14 0230 08/03/14 0738 08/04/14 0410 08/05/14 0420  HGB  --  14.4  --  14.6 15.3  HCT  --  46.8  --  46.4 47.5  PLT  --  122*  --  135* 135*  LABPROT  --  18.4*  --  21.5* 22.1*  INR  --  1.51*  --  1.85* 1.92*  CREATININE  --  1.07  --  1.01 1.10  TROPONINI <0.03 <0.03 <0.03  --   --     Estimated Creatinine Clearance: 77.3 mL/min (by C-G formula based on Cr of 1.1).   Assessment: 73yom on coumadin pta for afib/AVR presents to the ED with right lower extremity swelling and SOB. He will be admitted for CHF exacerbation and coumadin to resume. INR on admission is below goal at 1.31.   INR today = 1.92, no bleeding issues noted. CBC stable  Home dose: 5mg  daily - last taken 3/2  Goal of Therapy:  INR 2-3 Monitor platelets by anticoagulation protocol: Yes   Plan:  1) Repeat Coumadin 7.5mg  x 1 tonight 2) Daily INR  Thank you,  Erin Hearing PharmD., BCPS Clinical Pharmacist Pager [phone] 08/05/2014 9:16 AM

## 2014-08-06 NOTE — Telephone Encounter (Signed)
Will forward to Md to place podiatry referral. Matthew Edwards

## 2014-08-07 ENCOUNTER — Encounter: Payer: Self-pay | Admitting: Family Medicine

## 2014-08-07 ENCOUNTER — Ambulatory Visit (INDEPENDENT_AMBULATORY_CARE_PROVIDER_SITE_OTHER): Payer: Commercial Managed Care - HMO | Admitting: Family Medicine

## 2014-08-07 VITALS — BP 146/78 | HR 92 | Temp 98.3°F | Ht 69.0 in | Wt 258.0 lb

## 2014-08-07 DIAGNOSIS — I5033 Acute on chronic diastolic (congestive) heart failure: Secondary | ICD-10-CM

## 2014-08-07 DIAGNOSIS — I10 Essential (primary) hypertension: Secondary | ICD-10-CM

## 2014-08-07 DIAGNOSIS — I4891 Unspecified atrial fibrillation: Secondary | ICD-10-CM

## 2014-08-07 DIAGNOSIS — I359 Nonrheumatic aortic valve disorder, unspecified: Secondary | ICD-10-CM | POA: Diagnosis not present

## 2014-08-07 DIAGNOSIS — E785 Hyperlipidemia, unspecified: Secondary | ICD-10-CM | POA: Diagnosis not present

## 2014-08-07 DIAGNOSIS — I482 Chronic atrial fibrillation, unspecified: Secondary | ICD-10-CM

## 2014-08-07 DIAGNOSIS — E559 Vitamin D deficiency, unspecified: Secondary | ICD-10-CM

## 2014-08-07 DIAGNOSIS — E0843 Diabetes mellitus due to underlying condition with diabetic autonomic (poly)neuropathy: Secondary | ICD-10-CM

## 2014-08-07 DIAGNOSIS — I2510 Atherosclerotic heart disease of native coronary artery without angina pectoris: Secondary | ICD-10-CM

## 2014-08-07 DIAGNOSIS — E119 Type 2 diabetes mellitus without complications: Secondary | ICD-10-CM | POA: Diagnosis not present

## 2014-08-07 LAB — BASIC METABOLIC PANEL
BUN: 19 mg/dL (ref 6–23)
CO2: 33 mEq/L — ABNORMAL HIGH (ref 19–32)
Calcium: 10.2 mg/dL (ref 8.4–10.5)
Chloride: 98 mEq/L (ref 96–112)
Creat: 1.11 mg/dL (ref 0.50–1.35)
Glucose, Bld: 66 mg/dL — ABNORMAL LOW (ref 70–99)
Potassium: 4.3 mEq/L (ref 3.5–5.3)
Sodium: 138 mEq/L (ref 135–145)

## 2014-08-07 LAB — POCT INR: INR: 1.8

## 2014-08-07 MED ORDER — FUROSEMIDE 40 MG PO TABS: 40.0000 mg | ORAL_TABLET | Freq: Two times a day (BID) | ORAL | Status: DC

## 2014-08-07 MED ORDER — POTASSIUM CHLORIDE CRYS ER 20 MEQ PO TBCR: 20.0000 meq | EXTENDED_RELEASE_TABLET | Freq: Every day | ORAL | Status: DC

## 2014-08-07 MED ORDER — VITAMIN D3 25 MCG (1000 UNIT) PO TABS: 1000.0000 [IU] | ORAL_TABLET | Freq: Every day | ORAL | Status: DC

## 2014-08-07 MED ORDER — WARFARIN SODIUM 5 MG PO TABS: 5.0000 mg | ORAL_TABLET | ORAL | Status: DC

## 2014-08-07 MED ORDER — BENAZEPRIL HCL 20 MG PO TABS: 20.0000 mg | ORAL_TABLET | Freq: Every day | ORAL | Status: DC

## 2014-08-07 MED ORDER — GLIPIZIDE 10 MG PO TABS: 10.0000 mg | ORAL_TABLET | Freq: Every day | ORAL | Status: DC

## 2014-08-07 NOTE — Patient Instructions (Signed)
I will refill medications.  Follow up in 2 weeks with primary doctor to discuss any further medication refills and mainly to follow up on fluid overload.  Continue current dose of lasix for now and we will get labwork. I will call if any are NOT normal. Do not miss any doses of the lasix or warfarin.  We are checking INR and will decide what to do with warfarin dosing.  If you have chest pain, trouble breathing, increase in weight, increased swelling, or other concerns, seek immediate care.  Call Dr Jenne Campus for an appointment.  Be sure to follow up with cardiology as scheduled.   Best,  Matthew Sinclair, MD

## 2014-08-07 NOTE — Assessment & Plan Note (Addendum)
Continues to be improved from CHF exacerbation, though weight has increased 3 pounds since discharge. Prior noncompliance but dtr states has been taking. Baseline weight listed as 250lbs, weight today 258 lbs. Echo hospitalization mild LVH, preserved EF 50-55%, mildly dilated LA, elevated PA peak pressure 33mmhg, likely pulm HTN 2/2 osa, chronic a fib; Study not technically sufficient to measure diastolic function..  -Check weight daily with home scale. Call if consistently increasing. -Continue Lasix 40 mg by mouth twice a day. -Fluid restriction 1200 mL per day. Continue salt restriction. -Nutrition consulted. Patient to call for appointment. -BMET today -Cardiology appointment March 25. -Home health nurse order to help with medication management. Currently, daughter is helping patient. -Follow-up with PCP in 2 weeks, or sooner with any symptoms of increased overload. Consider BB at f/u.

## 2014-08-07 NOTE — Assessment & Plan Note (Signed)
Patient has not been seen at our Coumadin clinic in the past. However, due to subtherapeutic INR, we'll check here today>>> INR 1.8. Goal is 2.5. -10 mg warfarin today, 7.5 mg following 2 days, needs to be checked on Friday. Per lab discussion with patient's daughter, they prefer to have it checked at home health.

## 2014-08-07 NOTE — Progress Notes (Addendum)
Patient ID: Matthew Edwards, male   DOB: 1940-07-23, 74 y.o.   MRN: [id number] Subjective:   CC: Hosptial follow up  HPI:   Patient presents for hospital follow-up. Discharge summary not available so gleaned information from progress notes of discharge day and d/w upper level on service. Patient was hospitalized 3/3-3/6 for acute CHF exacerbation after missing multiple doses of lasix. Symptoms improved and he was discharged on Lasix 40 mg by mouth twice a day which is twice his home dose. Patient is unclear on his medications but per discussion with his daughter, he has been compliant with Lasix, missing 1 dose on day of discharge. He has a scale at home that he does not know how to turn on. Weight here is 3 pounds above weight at day of discharge. He denies any breathing difficulty and is able to lay flat on one pillow to sleep. Coughing is decreased from hospitalization. They have significantly decreased salt intake, but they're uncertain about if he is meeting fluid restriction limitations.  Patient has history of chronic atrial fibrillation and INR was subtherapeutic on discharge. Daughter reports he is taking warfarin 7.5 mg daily. They   would like for home health nurse to check and stay on top of this. They're amenable to INR check here today.  Review of Systems - Per HPI.   PMH: CHF exacerbation, HLD, alcohol abuse, HTN, CAD, GERD, aortic valve replacement, type 2 diabetes, chronic atrial fibrillation, cognitive impairment, COPD, chronic anticoagulation  SH: Daughter helps patient with meds. Former smoker, smoked 47 years, quit 8 years ago  Objective:  Physical Exam BP 146/78 mmHg  Pulse 92  Temp(Src) 98.3 F (36.8 C) (Oral)  Ht 5\' 9"  (1.753 m)  Wt 258 lb (117.028 kg)  BMI 38.08 kg/m2  SpO2 94% GEN: NAD Cardio vascular: Regular rate and rhythm, no murmurs rubs or gallops, no obvious JVD. Difficult to assess due to habitus Pulmonary: Clear to auscultation bilaterally, normal  effort Extremities: 2+ bilateral lower extremity edema to knee, no erythema or tenderness  Neuro: Awake, alert, no focal deficits, normal speech, some cognitive impairment evident when patient does not know which medicines he takes     Assessment:     Matthew Edwards is a 74 y.o. male here for hospital follow up    Plan:     # See problem list and after visit summary for problem-specific plans. - Per daughter's request, podiatry referral placed.  # Health Maintenance:  -Per daughter's request, refilled the following medications for 90 days: Vitamin D, glipizide, potassium, warfarin, benazepril, Lasix. Discussed that PCP would need to further manage, and that warfarin dose would be dependent upon today's and subsequent INRs.  Follow-up: Follow up in 2 weeks for f/u of fluid status.    Hilton Sinclair, MD [place]

## 2014-08-09 ENCOUNTER — Telehealth: Payer: Self-pay | Admitting: *Deleted

## 2014-08-09 DIAGNOSIS — F329 Major depressive disorder, single episode, unspecified: Secondary | ICD-10-CM | POA: Diagnosis not present

## 2014-08-09 DIAGNOSIS — F0390 Unspecified dementia without behavioral disturbance: Secondary | ICD-10-CM | POA: Diagnosis not present

## 2014-08-09 DIAGNOSIS — M25569 Pain in unspecified knee: Secondary | ICD-10-CM | POA: Diagnosis not present

## 2014-08-09 DIAGNOSIS — M6281 Muscle weakness (generalized): Secondary | ICD-10-CM | POA: Diagnosis not present

## 2014-08-09 DIAGNOSIS — I2510 Atherosclerotic heart disease of native coronary artery without angina pectoris: Secondary | ICD-10-CM | POA: Diagnosis not present

## 2014-08-09 DIAGNOSIS — I4891 Unspecified atrial fibrillation: Secondary | ICD-10-CM | POA: Diagnosis not present

## 2014-08-09 DIAGNOSIS — I509 Heart failure, unspecified: Secondary | ICD-10-CM | POA: Diagnosis not present

## 2014-08-09 DIAGNOSIS — I10 Essential (primary) hypertension: Secondary | ICD-10-CM | POA: Diagnosis not present

## 2014-08-09 DIAGNOSIS — E1140 Type 2 diabetes mellitus with diabetic neuropathy, unspecified: Secondary | ICD-10-CM | POA: Diagnosis not present

## 2014-08-09 NOTE — Telephone Encounter (Signed)
Spoke with patient on 08/08/14, he instructed me that he had his INR checked on 08/07/14 at [facility].  He told me to call his daughter to get the details and I did.  She advised that he did have his INR checked and they dosed it.   On today, 08/09/14, I called [place] and spoke with [facility].  She told me that they have resumed services on him.  Orders given to recheck INR on 08/14/14 and fax results to Coumadin Clinic and number provided.

## 2014-08-10 ENCOUNTER — Telehealth: Payer: Self-pay | Admitting: Family Medicine

## 2014-08-10 NOTE — Telephone Encounter (Signed)
Called and left voicemail with [facility] coumadin clinic that I would leave details here in chart.  Seems like this is being taken care of but wanted to [facility] your clinic of my visit with patient at [facility] 08/07/14 with f/u INR 1.8 with goal presumed at 2.5. Increased warfarin to 10mg  3/8, 7.5 next 2 days, and to recheck Friday with home health nurse since daughter stated they are established at your coumadin clinic and prefer home health draws. Sending to let you know.  Hilton Sinclair, MD

## 2014-08-10 NOTE — Telephone Encounter (Signed)
Left message on voicemail for patient's daughter to call back. When she calls, please let her know that the patient's metabolic panel was normal except for borderline blood sugar at 66. She should make sure he is eating 3 meals daily and has a snack available if he ever feels hypoglycemic. He should also follow up within about a week to see how his fluid status is doing with his primary doctor.  Hilton Sinclair, MD

## 2014-08-11 DIAGNOSIS — M25569 Pain in unspecified knee: Secondary | ICD-10-CM | POA: Diagnosis not present

## 2014-08-11 DIAGNOSIS — E1140 Type 2 diabetes mellitus with diabetic neuropathy, unspecified: Secondary | ICD-10-CM | POA: Diagnosis not present

## 2014-08-11 DIAGNOSIS — I2510 Atherosclerotic heart disease of native coronary artery without angina pectoris: Secondary | ICD-10-CM | POA: Diagnosis not present

## 2014-08-11 DIAGNOSIS — I4891 Unspecified atrial fibrillation: Secondary | ICD-10-CM | POA: Diagnosis not present

## 2014-08-11 DIAGNOSIS — F329 Major depressive disorder, single episode, unspecified: Secondary | ICD-10-CM | POA: Diagnosis not present

## 2014-08-11 DIAGNOSIS — M6281 Muscle weakness (generalized): Secondary | ICD-10-CM | POA: Diagnosis not present

## 2014-08-11 DIAGNOSIS — I509 Heart failure, unspecified: Secondary | ICD-10-CM | POA: Diagnosis not present

## 2014-08-11 DIAGNOSIS — I10 Essential (primary) hypertension: Secondary | ICD-10-CM | POA: Diagnosis not present

## 2014-08-11 DIAGNOSIS — F0390 Unspecified dementia without behavioral disturbance: Secondary | ICD-10-CM | POA: Diagnosis not present

## 2014-08-13 DIAGNOSIS — F329 Major depressive disorder, single episode, unspecified: Secondary | ICD-10-CM | POA: Diagnosis not present

## 2014-08-13 DIAGNOSIS — I10 Essential (primary) hypertension: Secondary | ICD-10-CM | POA: Diagnosis not present

## 2014-08-13 DIAGNOSIS — I4891 Unspecified atrial fibrillation: Secondary | ICD-10-CM | POA: Diagnosis not present

## 2014-08-13 DIAGNOSIS — I2510 Atherosclerotic heart disease of native coronary artery without angina pectoris: Secondary | ICD-10-CM | POA: Diagnosis not present

## 2014-08-13 DIAGNOSIS — M25569 Pain in unspecified knee: Secondary | ICD-10-CM | POA: Diagnosis not present

## 2014-08-13 DIAGNOSIS — I509 Heart failure, unspecified: Secondary | ICD-10-CM | POA: Diagnosis not present

## 2014-08-13 DIAGNOSIS — F0390 Unspecified dementia without behavioral disturbance: Secondary | ICD-10-CM | POA: Diagnosis not present

## 2014-08-13 DIAGNOSIS — E1140 Type 2 diabetes mellitus with diabetic neuropathy, unspecified: Secondary | ICD-10-CM | POA: Diagnosis not present

## 2014-08-13 DIAGNOSIS — M6281 Muscle weakness (generalized): Secondary | ICD-10-CM | POA: Diagnosis not present

## 2014-08-14 ENCOUNTER — Ambulatory Visit (INDEPENDENT_AMBULATORY_CARE_PROVIDER_SITE_OTHER): Payer: Commercial Managed Care - HMO | Admitting: Pharmacist Clinician (PhC)/ Clinical Pharmacy Specialist

## 2014-08-14 ENCOUNTER — Telehealth: Payer: Self-pay | Admitting: Cardiology

## 2014-08-14 DIAGNOSIS — F329 Major depressive disorder, single episode, unspecified: Secondary | ICD-10-CM | POA: Diagnosis not present

## 2014-08-14 DIAGNOSIS — M6281 Muscle weakness (generalized): Secondary | ICD-10-CM | POA: Diagnosis not present

## 2014-08-14 DIAGNOSIS — F0390 Unspecified dementia without behavioral disturbance: Secondary | ICD-10-CM | POA: Diagnosis not present

## 2014-08-14 DIAGNOSIS — I4891 Unspecified atrial fibrillation: Secondary | ICD-10-CM | POA: Diagnosis not present

## 2014-08-14 DIAGNOSIS — I10 Essential (primary) hypertension: Secondary | ICD-10-CM | POA: Diagnosis not present

## 2014-08-14 DIAGNOSIS — I2510 Atherosclerotic heart disease of native coronary artery without angina pectoris: Secondary | ICD-10-CM | POA: Diagnosis not present

## 2014-08-14 DIAGNOSIS — I509 Heart failure, unspecified: Secondary | ICD-10-CM | POA: Diagnosis not present

## 2014-08-14 DIAGNOSIS — M25569 Pain in unspecified knee: Secondary | ICD-10-CM | POA: Diagnosis not present

## 2014-08-14 DIAGNOSIS — I359 Nonrheumatic aortic valve disorder, unspecified: Secondary | ICD-10-CM

## 2014-08-14 DIAGNOSIS — Z5181 Encounter for therapeutic drug level monitoring: Secondary | ICD-10-CM

## 2014-08-14 DIAGNOSIS — E1140 Type 2 diabetes mellitus with diabetic neuropathy, unspecified: Secondary | ICD-10-CM | POA: Diagnosis not present

## 2014-08-14 LAB — POCT INR: INR: 1.7

## 2014-08-14 NOTE — Telephone Encounter (Signed)
KRISTIN IS AWARE AND CONTACT PATIENT

## 2014-08-14 NOTE — Telephone Encounter (Signed)
SPOKE home care RN from [facility].  REPORTING  INR TODAY IS 1.7 PER RN NO CHANGES MEDICATION AND PATIENT HAS NOT MISSED A DOSE. LAST CHECKED 07/13/14.  Informed [facility] will contact patient and her/[place]

## 2014-08-15 DIAGNOSIS — M6281 Muscle weakness (generalized): Secondary | ICD-10-CM | POA: Diagnosis not present

## 2014-08-15 DIAGNOSIS — I4891 Unspecified atrial fibrillation: Secondary | ICD-10-CM | POA: Diagnosis not present

## 2014-08-15 DIAGNOSIS — F329 Major depressive disorder, single episode, unspecified: Secondary | ICD-10-CM | POA: Diagnosis not present

## 2014-08-15 DIAGNOSIS — I10 Essential (primary) hypertension: Secondary | ICD-10-CM | POA: Diagnosis not present

## 2014-08-15 DIAGNOSIS — M25569 Pain in unspecified knee: Secondary | ICD-10-CM | POA: Diagnosis not present

## 2014-08-15 DIAGNOSIS — I509 Heart failure, unspecified: Secondary | ICD-10-CM | POA: Diagnosis not present

## 2014-08-15 DIAGNOSIS — F0390 Unspecified dementia without behavioral disturbance: Secondary | ICD-10-CM | POA: Diagnosis not present

## 2014-08-15 DIAGNOSIS — E1140 Type 2 diabetes mellitus with diabetic neuropathy, unspecified: Secondary | ICD-10-CM | POA: Diagnosis not present

## 2014-08-15 DIAGNOSIS — I2510 Atherosclerotic heart disease of native coronary artery without angina pectoris: Secondary | ICD-10-CM | POA: Diagnosis not present

## 2014-08-15 NOTE — Discharge Summary (Signed)
[facility] Discharge Summary  Patient name: Matthew Edwards Medical record number: [id number] Date of birth: November 03, 1940 Age: 74 y.o. Gender: male Date of Admission: 08/02/2014  Date of Discharge: 08/05/14 Admitting Physician: Blane Ohara McDiarmid, MD  Primary Care Provider: Donnamae Jude, MD Consultants: none  Indication for Hospitalization: CHF exacerbation/dyspnea  Discharge Diagnoses/Problem List:  Patient Active Problem List   Diagnosis Date Noted  . COPD bronchitis   . Chronic anticoagulation   . Chronic atrial fibrillation   . Diabetes mellitus type 2 with complications   . Acute on chronic congestive heart failure with left ventricular diastolic dysfunction 123456  . Cognitive impairment 07/02/2014  . Altered mental status 06/28/2014  . Abdominal distention 06/28/2014  . Unspecified constipation 12/13/2013  . Encounter for therapeutic drug monitoring 09/15/2013  . S/P AVR 12/29/2012  . Atrial fibrillation   . Diabetic neuropathy 11/25/2011  . Excessive tearing 10/06/2011  . Diabetes mellitus 08/06/2010  . BENIGN POSITIONAL VERTIGO 06/14/2010  . VITAMIN D DEFICIENCY 02/21/2010  . Primary hyperparathyroidism 06/04/2009  . Hypercalcemia 05/29/2009  . Essential hypertension 02/23/2007  . DEPRESSION 12/15/2006  . Coronary atherosclerosis 12/15/2006  . GERD 12/15/2006  . COPD 12/10/2006  . HLD (hyperlipidemia) 07/29/2006  . Alcohol abuse 07/29/2006  . Aortic valve disorder 07/29/2006    Disposition: home  Discharge Condition: improved  Discharge Exam:  General: NAD, AAOx3 Cardiovascular: Irregularly Irregular Rhythm, Rate controlled, +distal pulses Respiratory: CTAB, Appropriate rate, unlabored, no wheezes.  Abdomen: S, NT, ND, +BS Extremities: WWP, trace edema to the ankles bilaterally  Brief Hospital Course:  Patient was admitted with dyspnea related to volume overload with CHF exacerbation. He diuresed well and his weight  decreased by 4 lbs. He was able to be weaned off supplemental oxygen. An echocardiogram was obtained which showed pulmonary hypertension which was thought to be related to his sleep apnea. Given his history of CAD, troponins and EKGs were obtained during his stay which were negative and unchanged respectively.   His INR was subtherapeutic on admission. His dose was adjusted by pharmacy. He received 7.5mg  each day of his hospitalization and then was asked to resume 5mg  daily dosing until his follow-up appointment.   Issues for Follow Up:  1. INR - recheck and adjust as needed 2. Assess volume and adjust diuresis as needed 3. Reinforce need for fluid and salt restriction, refer to nutrition for more help with this.  Significant Procedures: none  Significant Labs and Imaging:   08/05/2014 04:20  Sodium 138  Potassium 4.0  Chloride 97  CO2 34 (H)  BUN 20  Creatinine 1.10  Calcium 9.8  GFR calc non Af Amer 66 (L)  GFR calc Af Amer 76 (L)  Glucose 95  Anion gap 7  WBC 6.0  RBC 5.22  Hemoglobin 15.3  HCT 47.5  MCV 91.0  MCH 29.3  MCHC 32.2  RDW 14.6  Platelets 135 (L)  Neutrophils Relative % 61  Lymphocytes Relative 27  Monocytes Relative 9  Eosinophils Relative 3  Basophils Relative 0  NEUT# 3.6  Lymphocytes Absolute 1.6  Monocytes Absolute 0.5  Eosinophils Absolute 0.2  Basophils Absolute 0.0  Prothrombin Time 22.1 (H)  INR 1.92 (H)   Echo: Left ventricle: The cavity size was normal. Wall thickness wasincreased in a pattern of mild LVH. Systolic function was normal.The estimated ejection fraction was in the range of 50% to 55%.Wall motion was normal; there were no regional wall motionabnormalities. Aortic valve: A bioprosthesis was present and functioningnormally.  Valve area (VTI): 1.95 cm^2. Valve area (Vmax): 1.89cm^2. Valve area (Vmean): 1.92 cm^2. Mitral valve: Calcified annulus. There was mild regurgitation. Left atrium: The atrium was mildly dilated. Pulmonary  arteries: PA peak pressure: 58 mm Hg (S).  Results/Tests Pending at Time of Discharge: none  Discharge Medications: List not what was given patient. Discontinued meds only appear this way because they were refilled after discharge.   Medication List    STOP taking these medications        benazepril 20 MG tablet  Commonly known as:  LOTENSIN     cholecalciferol 1000 UNITS tablet  Commonly known as:  VITAMIN D     furosemide 40 MG tablet  Commonly known as:  LASIX     glipiZIDE 10 MG tablet  Commonly known as:  GLUCOTROL     potassium chloride SA 20 MEQ tablet  Commonly known as:  K-DUR,KLOR-CON     warfarin 5 MG tablet  Commonly known as:  COUMADIN      TAKE these medications        albuterol 108 (90 BASE) MCG/ACT inhaler  Commonly known as:  PROVENTIL HFA;VENTOLIN HFA  Inhale 2 puffs into the lungs every 6 (six) hours as needed for wheezing or shortness of breath.     atorvastatin 20 MG tablet  Commonly known as:  LIPITOR  Take 1 tablet (20 mg total) by mouth daily at 6 PM.     budesonide-formoterol 80-4.5 MCG/ACT inhaler  Commonly known as:  SYMBICORT  Inhale 2 puffs into the lungs 2 (two) times daily.     nitroGLYCERIN 0.4 MG SL tablet  Commonly known as:  NITROSTAT  Place 1 tablet (0.4 mg total) under the tongue every 5 (five) minutes as needed. For chest pain     vitamin C 500 MG tablet  Commonly known as:  ASCORBIC ACID  Take 1 tablet (500 mg total) by mouth at bedtime.        Discharge Instructions: Please refer to Patient Instructions section of EMR for full details.  Patient was counseled important signs and symptoms that should prompt return to medical care, changes in medications, dietary instructions, activity restrictions, and follow up appointments.   Follow-Up Appointments:     Follow-up Information    Follow up with Peter Martinique, MD. Go on 08/24/2014.   Specialty:  Cardiology   Why:  Appt scheduled at 8:15am   Contact information:   [address] [place] [address] [phone]       Follow up with Conni Slipper, MD. Go on 08/07/2014.   Specialty:  Family Medicine   Why:  Appt scheduled at [facility] information:   [place] [address] [phone]       Frazier Richards, MD 08/15/2014, 3:46 PM PGY-2, [place]

## 2014-08-16 DIAGNOSIS — F329 Major depressive disorder, single episode, unspecified: Secondary | ICD-10-CM | POA: Diagnosis not present

## 2014-08-16 DIAGNOSIS — I509 Heart failure, unspecified: Secondary | ICD-10-CM | POA: Diagnosis not present

## 2014-08-16 DIAGNOSIS — M25569 Pain in unspecified knee: Secondary | ICD-10-CM | POA: Diagnosis not present

## 2014-08-16 DIAGNOSIS — I10 Essential (primary) hypertension: Secondary | ICD-10-CM | POA: Diagnosis not present

## 2014-08-16 DIAGNOSIS — I2510 Atherosclerotic heart disease of native coronary artery without angina pectoris: Secondary | ICD-10-CM | POA: Diagnosis not present

## 2014-08-16 DIAGNOSIS — M6281 Muscle weakness (generalized): Secondary | ICD-10-CM | POA: Diagnosis not present

## 2014-08-16 DIAGNOSIS — E1140 Type 2 diabetes mellitus with diabetic neuropathy, unspecified: Secondary | ICD-10-CM | POA: Diagnosis not present

## 2014-08-16 DIAGNOSIS — I4891 Unspecified atrial fibrillation: Secondary | ICD-10-CM | POA: Diagnosis not present

## 2014-08-16 DIAGNOSIS — F0390 Unspecified dementia without behavioral disturbance: Secondary | ICD-10-CM | POA: Diagnosis not present

## 2014-08-17 DIAGNOSIS — E1140 Type 2 diabetes mellitus with diabetic neuropathy, unspecified: Secondary | ICD-10-CM | POA: Diagnosis not present

## 2014-08-17 DIAGNOSIS — I2510 Atherosclerotic heart disease of native coronary artery without angina pectoris: Secondary | ICD-10-CM | POA: Diagnosis not present

## 2014-08-17 DIAGNOSIS — I509 Heart failure, unspecified: Secondary | ICD-10-CM | POA: Diagnosis not present

## 2014-08-17 DIAGNOSIS — M6281 Muscle weakness (generalized): Secondary | ICD-10-CM | POA: Diagnosis not present

## 2014-08-17 DIAGNOSIS — M25569 Pain in unspecified knee: Secondary | ICD-10-CM | POA: Diagnosis not present

## 2014-08-17 DIAGNOSIS — I4891 Unspecified atrial fibrillation: Secondary | ICD-10-CM | POA: Diagnosis not present

## 2014-08-17 DIAGNOSIS — F0390 Unspecified dementia without behavioral disturbance: Secondary | ICD-10-CM | POA: Diagnosis not present

## 2014-08-17 DIAGNOSIS — F329 Major depressive disorder, single episode, unspecified: Secondary | ICD-10-CM | POA: Diagnosis not present

## 2014-08-17 DIAGNOSIS — I10 Essential (primary) hypertension: Secondary | ICD-10-CM | POA: Diagnosis not present

## 2014-08-20 DIAGNOSIS — I2510 Atherosclerotic heart disease of native coronary artery without angina pectoris: Secondary | ICD-10-CM | POA: Diagnosis not present

## 2014-08-20 DIAGNOSIS — M6281 Muscle weakness (generalized): Secondary | ICD-10-CM | POA: Diagnosis not present

## 2014-08-20 DIAGNOSIS — I4891 Unspecified atrial fibrillation: Secondary | ICD-10-CM | POA: Diagnosis not present

## 2014-08-20 DIAGNOSIS — I10 Essential (primary) hypertension: Secondary | ICD-10-CM | POA: Diagnosis not present

## 2014-08-20 DIAGNOSIS — I509 Heart failure, unspecified: Secondary | ICD-10-CM | POA: Diagnosis not present

## 2014-08-20 DIAGNOSIS — F0390 Unspecified dementia without behavioral disturbance: Secondary | ICD-10-CM | POA: Diagnosis not present

## 2014-08-20 DIAGNOSIS — E1140 Type 2 diabetes mellitus with diabetic neuropathy, unspecified: Secondary | ICD-10-CM | POA: Diagnosis not present

## 2014-08-20 DIAGNOSIS — F329 Major depressive disorder, single episode, unspecified: Secondary | ICD-10-CM | POA: Diagnosis not present

## 2014-08-20 DIAGNOSIS — M25569 Pain in unspecified knee: Secondary | ICD-10-CM | POA: Diagnosis not present

## 2014-08-21 DIAGNOSIS — M6281 Muscle weakness (generalized): Secondary | ICD-10-CM | POA: Diagnosis not present

## 2014-08-21 DIAGNOSIS — I10 Essential (primary) hypertension: Secondary | ICD-10-CM | POA: Diagnosis not present

## 2014-08-21 DIAGNOSIS — E1140 Type 2 diabetes mellitus with diabetic neuropathy, unspecified: Secondary | ICD-10-CM | POA: Diagnosis not present

## 2014-08-21 DIAGNOSIS — M25569 Pain in unspecified knee: Secondary | ICD-10-CM | POA: Diagnosis not present

## 2014-08-21 DIAGNOSIS — F0390 Unspecified dementia without behavioral disturbance: Secondary | ICD-10-CM | POA: Diagnosis not present

## 2014-08-21 DIAGNOSIS — I4891 Unspecified atrial fibrillation: Secondary | ICD-10-CM | POA: Diagnosis not present

## 2014-08-21 DIAGNOSIS — I509 Heart failure, unspecified: Secondary | ICD-10-CM | POA: Diagnosis not present

## 2014-08-21 DIAGNOSIS — I2510 Atherosclerotic heart disease of native coronary artery without angina pectoris: Secondary | ICD-10-CM | POA: Diagnosis not present

## 2014-08-21 DIAGNOSIS — F329 Major depressive disorder, single episode, unspecified: Secondary | ICD-10-CM | POA: Diagnosis not present

## 2014-08-22 DIAGNOSIS — I509 Heart failure, unspecified: Secondary | ICD-10-CM | POA: Diagnosis not present

## 2014-08-22 DIAGNOSIS — M6281 Muscle weakness (generalized): Secondary | ICD-10-CM | POA: Diagnosis not present

## 2014-08-22 DIAGNOSIS — E1140 Type 2 diabetes mellitus with diabetic neuropathy, unspecified: Secondary | ICD-10-CM | POA: Diagnosis not present

## 2014-08-22 DIAGNOSIS — M25569 Pain in unspecified knee: Secondary | ICD-10-CM | POA: Diagnosis not present

## 2014-08-22 DIAGNOSIS — F329 Major depressive disorder, single episode, unspecified: Secondary | ICD-10-CM | POA: Diagnosis not present

## 2014-08-22 DIAGNOSIS — F0390 Unspecified dementia without behavioral disturbance: Secondary | ICD-10-CM | POA: Diagnosis not present

## 2014-08-22 DIAGNOSIS — I4891 Unspecified atrial fibrillation: Secondary | ICD-10-CM | POA: Diagnosis not present

## 2014-08-22 DIAGNOSIS — I2510 Atherosclerotic heart disease of native coronary artery without angina pectoris: Secondary | ICD-10-CM | POA: Diagnosis not present

## 2014-08-22 DIAGNOSIS — I10 Essential (primary) hypertension: Secondary | ICD-10-CM | POA: Diagnosis not present

## 2014-08-23 ENCOUNTER — Telehealth: Payer: Self-pay | Admitting: Family Medicine

## 2014-08-23 ENCOUNTER — Ambulatory Visit (INDEPENDENT_AMBULATORY_CARE_PROVIDER_SITE_OTHER): Payer: Commercial Managed Care - HMO | Admitting: Interventional Cardiology

## 2014-08-23 DIAGNOSIS — I4891 Unspecified atrial fibrillation: Secondary | ICD-10-CM

## 2014-08-23 DIAGNOSIS — F329 Major depressive disorder, single episode, unspecified: Secondary | ICD-10-CM | POA: Diagnosis not present

## 2014-08-23 DIAGNOSIS — M25569 Pain in unspecified knee: Secondary | ICD-10-CM | POA: Diagnosis not present

## 2014-08-23 DIAGNOSIS — I10 Essential (primary) hypertension: Secondary | ICD-10-CM | POA: Diagnosis not present

## 2014-08-23 DIAGNOSIS — M6281 Muscle weakness (generalized): Secondary | ICD-10-CM | POA: Diagnosis not present

## 2014-08-23 DIAGNOSIS — I359 Nonrheumatic aortic valve disorder, unspecified: Secondary | ICD-10-CM

## 2014-08-23 DIAGNOSIS — E1140 Type 2 diabetes mellitus with diabetic neuropathy, unspecified: Secondary | ICD-10-CM | POA: Diagnosis not present

## 2014-08-23 DIAGNOSIS — Z5181 Encounter for therapeutic drug level monitoring: Secondary | ICD-10-CM

## 2014-08-23 DIAGNOSIS — F0390 Unspecified dementia without behavioral disturbance: Secondary | ICD-10-CM | POA: Diagnosis not present

## 2014-08-23 DIAGNOSIS — I2510 Atherosclerotic heart disease of native coronary artery without angina pectoris: Secondary | ICD-10-CM | POA: Diagnosis not present

## 2014-08-23 DIAGNOSIS — I509 Heart failure, unspecified: Secondary | ICD-10-CM | POA: Diagnosis not present

## 2014-08-23 LAB — POCT INR: INR: 2.1

## 2014-08-23 NOTE — Telephone Encounter (Signed)
Auth number [id number] approved for 6 months starting 08/24/14. Kennyth Lose will call Suanne Marker back for me, as I am not in the office today.

## 2014-08-23 NOTE — Telephone Encounter (Signed)
Suanne Marker from [facility] called and needs a referral for the patient who is being seen 08/24/14. Please call her at [phone]. jw

## 2014-08-24 ENCOUNTER — Ambulatory Visit (INDEPENDENT_AMBULATORY_CARE_PROVIDER_SITE_OTHER): Payer: Commercial Managed Care - HMO | Admitting: Cardiology

## 2014-08-24 ENCOUNTER — Encounter: Payer: Self-pay | Admitting: Cardiology

## 2014-08-24 VITALS — BP 120/68 | HR 72 | Ht 69.0 in | Wt 255.2 lb

## 2014-08-24 DIAGNOSIS — Z954 Presence of other heart-valve replacement: Secondary | ICD-10-CM

## 2014-08-24 DIAGNOSIS — I10 Essential (primary) hypertension: Secondary | ICD-10-CM | POA: Diagnosis not present

## 2014-08-24 DIAGNOSIS — Z952 Presence of prosthetic heart valve: Secondary | ICD-10-CM

## 2014-08-24 DIAGNOSIS — I270 Primary pulmonary hypertension: Secondary | ICD-10-CM

## 2014-08-24 DIAGNOSIS — I482 Chronic atrial fibrillation, unspecified: Secondary | ICD-10-CM

## 2014-08-24 DIAGNOSIS — I272 Pulmonary hypertension, unspecified: Secondary | ICD-10-CM | POA: Insufficient documentation

## 2014-08-24 DIAGNOSIS — Z7901 Long term (current) use of anticoagulants: Secondary | ICD-10-CM | POA: Diagnosis not present

## 2014-08-24 DIAGNOSIS — I359 Nonrheumatic aortic valve disorder, unspecified: Secondary | ICD-10-CM | POA: Diagnosis not present

## 2014-08-24 NOTE — Progress Notes (Signed)
Matthew Edwards Date of Birth: 07-09-1940 Medical Record [id number]  History of Present Illness: Matthew Edwards is seen for followup CHF. He has a history of coronary disease with prior stenting of the LAD and left circumflex coronary disease in the past. In 2012 he underwent open heart surgery with an LIMA graft to the LAD and aortic valve replacement with a #25 mm Edwards pericardial valve. He has a history of diabetes, hypertension, and hyperlipidemia. He also has a history of chronic atrial fibrillation and is on coumadin.  He was recently admitted in early March with CHF exacerbation. Echo showed normal prosthetic valve function with EF 50-55%. He did have pulmonary HTN with estimated PA pressure of 58 mm Hg. He was diuresed with improvement. His daughter is with him today. She reports they have changed his diet eliminating a lot of sodium and reducing carb intake. He is getting home PT/OT/ST now and improving. She reports he does have apneic spells when sleeping. He has never been formally evaluated.  Current Outpatient Prescriptions on File Prior to Visit  Medication Sig Dispense Refill  . albuterol (PROVENTIL HFA;VENTOLIN HFA) 108 (90 BASE) MCG/ACT inhaler Inhale 2 puffs into the lungs every 6 (six) hours as needed for wheezing or shortness of breath. 1 Inhaler 2  . atorvastatin (LIPITOR) 20 MG tablet Take 1 tablet (20 mg total) by mouth daily at 6 PM. 90 tablet 3  . benazepril (LOTENSIN) 20 MG tablet Take 1 tablet (20 mg total) by mouth daily. 90 tablet 0  . budesonide-formoterol (SYMBICORT) 80-4.5 MCG/ACT inhaler Inhale 2 puffs into the lungs 2 (two) times daily. 10.2 g 11  . cholecalciferol (VITAMIN D) 1000 UNITS tablet Take 1 tablet (1,000 Units total) by mouth daily. 90 tablet 0  . furosemide (LASIX) 40 MG tablet Take 1 tablet (40 mg total) by mouth 2 (two) times daily. 90 tablet 0  . glipiZIDE (GLUCOTROL) 10 MG tablet Take 1 tablet (10 mg total) by mouth at bedtime. 90 tablet 0  .  nitroGLYCERIN (NITROSTAT) 0.4 MG SL tablet Place 1 tablet (0.4 mg total) under the tongue every 5 (five) minutes as needed. For chest pain 30 tablet 3  . potassium chloride SA (K-DUR,KLOR-CON) 20 MEQ tablet Take 1 tablet (20 mEq total) by mouth daily. 90 tablet 0  . vitamin C (ASCORBIC ACID) 500 MG tablet Take 1 tablet (500 mg total) by mouth at bedtime. 90 tablet 3  . warfarin (COUMADIN) 5 MG tablet Take 1 tablet (5 mg total) by mouth as directed. 90 tablet 1   No current facility-administered medications on file prior to visit.    Allergies  Allergen Reactions  . Pineapple Concentrate Nausea And Vomiting    Past Medical History  Diagnosis Date  . Hyperlipidemia   . Hypertension   . CAD (coronary artery disease) 2012    [place]-LAD  . GERD (gastroesophageal reflux disease)   . S/P AVR     #25 mm Edwards pericardial valve Bioprosthetic. Dr Cyndia Bent.  . Atrial fibrillation   . HEART FAILURE, CONGESTIVE UNSPEC 02/23/2007    Qualifier: Diagnosis of  By: Mellody Drown MD, [facility]    . TOBACCO DEPENDENCE 07/29/2006    Qualifier: History of  By: Carlena Sax  MD, [facility] [place]    . COPD (chronic obstructive pulmonary disease)   . Type II diabetes mellitus   . Situational depression     "son passed 11/2013"    Past Surgical History  Procedure Laterality Date  . Aortic valve replacement  07/2010  notes 07/28/2010  . Cardiac valve replacement    . Coronary artery bypass graft  07/2010    "1"/notes 07/28/2010  . Coronary angioplasty with stent placement  2006; 10/2006    "2"; 1/notes 10/01/2010  . Cardiac catheterization  06/2010    /notes 07/01/2010    History  Smoking status  . Former Smoker -- 0.50 packs/day for 46 years  . Types: Cigarettes  . Quit date: 07/08/2004  Smokeless tobacco  . Never Used    History  Alcohol Use No    Family History  Problem Relation Age of Onset  . Heart disease Mother     Review of Systems: As noted in history of present illness. All other systems were reviewed  and are negative.  Physical Exam: BP 120/68 mmHg  Pulse 72  Ht 5\' 9"  (1.753 m)  Wt 255 lb 3.2 oz (115.758 kg)  BMI 37.67 kg/m2 He is a pleasant black male in no acute distress. The HEENT exam is normal. The carotids are 2+ without bruits.  There is no thyromegaly.  There is no JVD.  The lungs are clear.   His median sternotomy scar has healed well. The heart exam reveals an irregular rate and rhythm  with a normal S1 and S2.  There is a soft systolic ejection sound at the right upper sternal border.  The PMI is not displaced.   Abdominal exam reveals good bowel sounds.  There is no hepatosplenomegaly or tenderness.  There are no masses.  Exam of the legs reveal no clubbing, cyanosis. There is 1+ edema L>R. The distal pulses are intact.  Cranial nerves II - XII are intact.  Motor and sensory functions are intact.  The gait is normal.  LABORATORY DATA:  Lab Results  Component Value Date   WBC 6.0 08/05/2014   HGB 15.3 08/05/2014   HCT 47.5 08/05/2014   PLT 135* 08/05/2014   GLUCOSE 66* 08/07/2014   CHOL 117 12/13/2013   TRIG 147 12/13/2013   HDL 38* 12/13/2013   LDLDIRECT 81 12/23/2012   LDLCALC 50 12/13/2013   ALT 18 08/02/2014   AST 23 08/02/2014   NA 138 08/07/2014   K 4.3 08/07/2014   CL 98 08/07/2014   CREATININE 1.11 08/07/2014   BUN 19 08/07/2014   CO2 33* 08/07/2014   TSH 1.417 08/02/2014   PSA 0.34 05/27/2009   INR 2.1 08/23/2014   HGBA1C 7.7 06/28/2014   MICROALBUR 0.50 11/25/2011   INR 2.1 yesterday.  Assessment / Plan: 1. Coronary disease status post remote stenting of the LAD and left circumflex. Status post CABG in 2012 with an LIMA graft to the LAD. He is asymptomatic. Continue risk factor modification.  2. Recent acute on chronic diastolic CHF. Dietary sodium intake was a contributing factor. Reviewed recommendation for sodium restriction. Continue ACEi and diuretic therapy.   3. Aortic stenosis status post aortic valve replacement with a pericardial valve.  Recent Echo showed normal valve function.  4. Hyperlipidemia. He is on Lipitor.   5. Diabetes mellitus type 2.   6. Atrial fibrillation-permanent. Rate is well controlled. He is anticoagulated with Coumadin.   7. Pulmonary HTN by Echo. This is new since 2012 when he had his valve surgery. ? Related to obesity and OSA. Will schedule for sleep study. If abnormal may benefit from CPAP therapy.  I will follow up in 3 months.

## 2014-08-24 NOTE — Patient Instructions (Signed)
Continue your current therapy  Continue to watch your sodium intake  We will schedule you for a sleep study to evaluate sleep apnea.  I will see you in 3 months.

## 2014-08-27 DIAGNOSIS — I509 Heart failure, unspecified: Secondary | ICD-10-CM | POA: Diagnosis not present

## 2014-08-27 DIAGNOSIS — F0390 Unspecified dementia without behavioral disturbance: Secondary | ICD-10-CM | POA: Diagnosis not present

## 2014-08-27 DIAGNOSIS — I2510 Atherosclerotic heart disease of native coronary artery without angina pectoris: Secondary | ICD-10-CM | POA: Diagnosis not present

## 2014-08-27 DIAGNOSIS — E1140 Type 2 diabetes mellitus with diabetic neuropathy, unspecified: Secondary | ICD-10-CM | POA: Diagnosis not present

## 2014-08-27 DIAGNOSIS — I10 Essential (primary) hypertension: Secondary | ICD-10-CM | POA: Diagnosis not present

## 2014-08-27 DIAGNOSIS — I4891 Unspecified atrial fibrillation: Secondary | ICD-10-CM | POA: Diagnosis not present

## 2014-08-27 DIAGNOSIS — M6281 Muscle weakness (generalized): Secondary | ICD-10-CM | POA: Diagnosis not present

## 2014-08-27 DIAGNOSIS — M25569 Pain in unspecified knee: Secondary | ICD-10-CM | POA: Diagnosis not present

## 2014-08-27 DIAGNOSIS — F329 Major depressive disorder, single episode, unspecified: Secondary | ICD-10-CM | POA: Diagnosis not present

## 2014-08-29 ENCOUNTER — Ambulatory Visit: Payer: Commercial Managed Care - HMO | Admitting: Podiatry

## 2014-08-29 DIAGNOSIS — F0390 Unspecified dementia without behavioral disturbance: Secondary | ICD-10-CM | POA: Diagnosis not present

## 2014-08-29 DIAGNOSIS — F329 Major depressive disorder, single episode, unspecified: Secondary | ICD-10-CM | POA: Diagnosis not present

## 2014-08-29 DIAGNOSIS — I509 Heart failure, unspecified: Secondary | ICD-10-CM | POA: Diagnosis not present

## 2014-08-29 DIAGNOSIS — E1140 Type 2 diabetes mellitus with diabetic neuropathy, unspecified: Secondary | ICD-10-CM | POA: Diagnosis not present

## 2014-08-29 DIAGNOSIS — M6281 Muscle weakness (generalized): Secondary | ICD-10-CM | POA: Diagnosis not present

## 2014-08-29 DIAGNOSIS — I2510 Atherosclerotic heart disease of native coronary artery without angina pectoris: Secondary | ICD-10-CM | POA: Diagnosis not present

## 2014-08-29 DIAGNOSIS — I10 Essential (primary) hypertension: Secondary | ICD-10-CM | POA: Diagnosis not present

## 2014-08-29 DIAGNOSIS — I4891 Unspecified atrial fibrillation: Secondary | ICD-10-CM | POA: Diagnosis not present

## 2014-08-29 DIAGNOSIS — M25569 Pain in unspecified knee: Secondary | ICD-10-CM | POA: Diagnosis not present

## 2014-09-03 DIAGNOSIS — I482 Chronic atrial fibrillation: Secondary | ICD-10-CM | POA: Diagnosis not present

## 2014-09-03 DIAGNOSIS — I2510 Atherosclerotic heart disease of native coronary artery without angina pectoris: Secondary | ICD-10-CM | POA: Diagnosis not present

## 2014-09-03 DIAGNOSIS — E1140 Type 2 diabetes mellitus with diabetic neuropathy, unspecified: Secondary | ICD-10-CM | POA: Diagnosis not present

## 2014-09-03 DIAGNOSIS — F329 Major depressive disorder, single episode, unspecified: Secondary | ICD-10-CM | POA: Diagnosis not present

## 2014-09-03 DIAGNOSIS — I10 Essential (primary) hypertension: Secondary | ICD-10-CM | POA: Diagnosis not present

## 2014-09-03 DIAGNOSIS — F0390 Unspecified dementia without behavioral disturbance: Secondary | ICD-10-CM | POA: Diagnosis not present

## 2014-09-03 DIAGNOSIS — I5033 Acute on chronic diastolic (congestive) heart failure: Secondary | ICD-10-CM | POA: Diagnosis not present

## 2014-09-03 DIAGNOSIS — J449 Chronic obstructive pulmonary disease, unspecified: Secondary | ICD-10-CM | POA: Diagnosis not present

## 2014-09-03 DIAGNOSIS — Z5181 Encounter for therapeutic drug level monitoring: Secondary | ICD-10-CM | POA: Diagnosis not present

## 2014-09-04 ENCOUNTER — Other Ambulatory Visit: Payer: Self-pay | Admitting: *Deleted

## 2014-09-04 NOTE — Patient Outreach (Signed)
Phone call to patient's daughter Earlin Timpson who states that patient is having difficulty affording his medications, mainly albuterol, symbicort and potassium.   He also needs assistance with obtaining bathroom aids to prevent falls. Patient lives alone and is also having difficulty buying food.  Home visit scheduled for 09/10/14 at 9 am to complete needs assessment.  Patient's daughter will be in attendance.   Sheralyn Boatman [facility] Care Management [phone]

## 2014-09-04 NOTE — Patient Outreach (Signed)
Phone call to patient to schedule initial home visit related to financial help.  This Education officer, museum given verbal permission to speak to his daughter Virlyn Timmer regarding his financial needs and the scheduling of the initial home visit. Patient states that he also needs a nurse to assist with diet education.   Home visit set up for 09/10/14 at [place].  This social worker will follow up with patient's daughter.   Sheralyn Boatman [facility] Care Management [phone]

## 2014-09-05 ENCOUNTER — Ambulatory Visit (INDEPENDENT_AMBULATORY_CARE_PROVIDER_SITE_OTHER): Payer: Commercial Managed Care - HMO | Admitting: Cardiology

## 2014-09-05 ENCOUNTER — Telehealth: Payer: Self-pay | Admitting: *Deleted

## 2014-09-05 DIAGNOSIS — Z5181 Encounter for therapeutic drug level monitoring: Secondary | ICD-10-CM | POA: Diagnosis not present

## 2014-09-05 DIAGNOSIS — I5033 Acute on chronic diastolic (congestive) heart failure: Secondary | ICD-10-CM | POA: Diagnosis not present

## 2014-09-05 DIAGNOSIS — F329 Major depressive disorder, single episode, unspecified: Secondary | ICD-10-CM | POA: Diagnosis not present

## 2014-09-05 DIAGNOSIS — I10 Essential (primary) hypertension: Secondary | ICD-10-CM | POA: Diagnosis not present

## 2014-09-05 DIAGNOSIS — J449 Chronic obstructive pulmonary disease, unspecified: Secondary | ICD-10-CM | POA: Diagnosis not present

## 2014-09-05 DIAGNOSIS — I482 Chronic atrial fibrillation, unspecified: Secondary | ICD-10-CM

## 2014-09-05 DIAGNOSIS — F0390 Unspecified dementia without behavioral disturbance: Secondary | ICD-10-CM | POA: Diagnosis not present

## 2014-09-05 DIAGNOSIS — I359 Nonrheumatic aortic valve disorder, unspecified: Secondary | ICD-10-CM

## 2014-09-05 DIAGNOSIS — I2510 Atherosclerotic heart disease of native coronary artery without angina pectoris: Secondary | ICD-10-CM | POA: Diagnosis not present

## 2014-09-05 DIAGNOSIS — E1140 Type 2 diabetes mellitus with diabetic neuropathy, unspecified: Secondary | ICD-10-CM | POA: Diagnosis not present

## 2014-09-05 LAB — POCT INR: INR: 2.5

## 2014-09-05 NOTE — Telephone Encounter (Signed)
[facility] and spoke with Estill Bamberg in regards overdue INR results on the patient.  She states that she saw the order in the chart but the INR was not done. She apologized for the event and states she will have it done today and call or fax results to [place].  Instructed her that we need results to day because they were due on 09/03/14.  She verbalized understanding and states it will be done today. Jalecia Leon, [name], RN

## 2014-09-06 DIAGNOSIS — J449 Chronic obstructive pulmonary disease, unspecified: Secondary | ICD-10-CM | POA: Diagnosis not present

## 2014-09-06 DIAGNOSIS — I2510 Atherosclerotic heart disease of native coronary artery without angina pectoris: Secondary | ICD-10-CM | POA: Diagnosis not present

## 2014-09-06 DIAGNOSIS — F329 Major depressive disorder, single episode, unspecified: Secondary | ICD-10-CM | POA: Diagnosis not present

## 2014-09-06 DIAGNOSIS — I482 Chronic atrial fibrillation: Secondary | ICD-10-CM | POA: Diagnosis not present

## 2014-09-06 DIAGNOSIS — E1140 Type 2 diabetes mellitus with diabetic neuropathy, unspecified: Secondary | ICD-10-CM | POA: Diagnosis not present

## 2014-09-06 DIAGNOSIS — F0390 Unspecified dementia without behavioral disturbance: Secondary | ICD-10-CM | POA: Diagnosis not present

## 2014-09-06 DIAGNOSIS — I10 Essential (primary) hypertension: Secondary | ICD-10-CM | POA: Diagnosis not present

## 2014-09-06 DIAGNOSIS — I5033 Acute on chronic diastolic (congestive) heart failure: Secondary | ICD-10-CM | POA: Diagnosis not present

## 2014-09-06 DIAGNOSIS — Z5181 Encounter for therapeutic drug level monitoring: Secondary | ICD-10-CM | POA: Diagnosis not present

## 2014-09-07 ENCOUNTER — Telehealth: Payer: Self-pay | Admitting: Cardiology

## 2014-09-07 ENCOUNTER — Other Ambulatory Visit: Payer: Self-pay

## 2014-09-07 DIAGNOSIS — I5033 Acute on chronic diastolic (congestive) heart failure: Secondary | ICD-10-CM | POA: Diagnosis not present

## 2014-09-07 DIAGNOSIS — J449 Chronic obstructive pulmonary disease, unspecified: Secondary | ICD-10-CM | POA: Diagnosis not present

## 2014-09-07 DIAGNOSIS — I2510 Atherosclerotic heart disease of native coronary artery without angina pectoris: Secondary | ICD-10-CM | POA: Diagnosis not present

## 2014-09-07 DIAGNOSIS — I10 Essential (primary) hypertension: Secondary | ICD-10-CM | POA: Diagnosis not present

## 2014-09-07 DIAGNOSIS — F0390 Unspecified dementia without behavioral disturbance: Secondary | ICD-10-CM | POA: Diagnosis not present

## 2014-09-07 DIAGNOSIS — Z5181 Encounter for therapeutic drug level monitoring: Secondary | ICD-10-CM | POA: Diagnosis not present

## 2014-09-07 DIAGNOSIS — F329 Major depressive disorder, single episode, unspecified: Secondary | ICD-10-CM | POA: Diagnosis not present

## 2014-09-07 DIAGNOSIS — I482 Chronic atrial fibrillation: Secondary | ICD-10-CM | POA: Diagnosis not present

## 2014-09-07 DIAGNOSIS — E1140 Type 2 diabetes mellitus with diabetic neuropathy, unspecified: Secondary | ICD-10-CM | POA: Diagnosis not present

## 2014-09-07 NOTE — Telephone Encounter (Signed)
Confirmed coumadin dose with [place]. Informed her next check is in 2 weeks

## 2014-09-07 NOTE — Patient Outreach (Signed)
I called [name] discuss assistance with his Albuterol.  He was referred to me by [facility] Management.  [name] stated he cannot get his albuterol refilled until 09/10/14.  I asked if he could tell me the number of puffs he had left on his inhaler.  He stated he had 40 puffs left according to the counter on the inhaler.  He only uses it twice a day.  This will get him through he can get it filled on 09/10/14.  I asked if he had any more questions regarding his medications or if he needed assistance with any other medications and he stated he did not.  I am happy to assist with future pharmacy issues as needed.    Deanne Coffer, PharmD, [place] [phone]

## 2014-09-07 NOTE — Telephone Encounter (Signed)
Judeen Hammans is calling in to get the pt's current coumadin dosage   Thanks

## 2014-09-10 ENCOUNTER — Other Ambulatory Visit: Payer: Self-pay | Admitting: *Deleted

## 2014-09-10 DIAGNOSIS — J449 Chronic obstructive pulmonary disease, unspecified: Secondary | ICD-10-CM | POA: Diagnosis not present

## 2014-09-10 DIAGNOSIS — I2510 Atherosclerotic heart disease of native coronary artery without angina pectoris: Secondary | ICD-10-CM | POA: Diagnosis not present

## 2014-09-10 DIAGNOSIS — E1140 Type 2 diabetes mellitus with diabetic neuropathy, unspecified: Secondary | ICD-10-CM | POA: Diagnosis not present

## 2014-09-10 DIAGNOSIS — I5033 Acute on chronic diastolic (congestive) heart failure: Secondary | ICD-10-CM | POA: Diagnosis not present

## 2014-09-10 DIAGNOSIS — F0390 Unspecified dementia without behavioral disturbance: Secondary | ICD-10-CM | POA: Diagnosis not present

## 2014-09-10 DIAGNOSIS — Z5181 Encounter for therapeutic drug level monitoring: Secondary | ICD-10-CM | POA: Diagnosis not present

## 2014-09-10 DIAGNOSIS — F329 Major depressive disorder, single episode, unspecified: Secondary | ICD-10-CM | POA: Diagnosis not present

## 2014-09-10 DIAGNOSIS — I10 Essential (primary) hypertension: Secondary | ICD-10-CM | POA: Diagnosis not present

## 2014-09-10 DIAGNOSIS — I482 Chronic atrial fibrillation: Secondary | ICD-10-CM | POA: Diagnosis not present

## 2014-09-10 NOTE — Patient Outreach (Signed)
  [place] [facility]) Care Management  [facility] Social Work  09/10/2014  Matthew Edwards [birth year] [id number]    Current Medications:  Current Outpatient Prescriptions  Medication Sig Dispense Refill  . atorvastatin (LIPITOR) 20 MG tablet Take 1 tablet (20 mg total) by mouth daily at 6 PM. 90 tablet 3  . benazepril (LOTENSIN) 20 MG tablet Take 1 tablet (20 mg total) by mouth daily. 90 tablet 0  . budesonide-formoterol (SYMBICORT) 80-4.5 MCG/ACT inhaler Inhale 2 puffs into the lungs 2 (two) times daily. 10.2 g 11  . cholecalciferol (VITAMIN D) 1000 UNITS tablet Take 1 tablet (1,000 Units total) by mouth daily. 90 tablet 0  . glipiZIDE (GLUCOTROL) 10 MG tablet Take 1 tablet (10 mg total) by mouth at bedtime. 90 tablet 0  . vitamin C (ASCORBIC ACID) 500 MG tablet Take 1 tablet (500 mg total) by mouth at bedtime. 90 tablet 3  . warfarin (COUMADIN) 5 MG tablet Take 1 tablet (5 mg total) by mouth as directed. 90 tablet 1  . albuterol (PROVENTIL HFA;VENTOLIN HFA) 108 (90 BASE) MCG/ACT inhaler Inhale 2 puffs into the lungs every 6 (six) hours as needed for wheezing or shortness of breath. 1 Inhaler 2  . furosemide (LASIX) 40 MG tablet Take 1 tablet (40 mg total) by mouth 2 (two) times daily. 90 tablet 0  . nitroGLYCERIN (NITROSTAT) 0.4 MG SL tablet Place 1 tablet (0.4 mg total) under the tongue every 5 (five) minutes as needed. For chest pain 30 tablet 3  . potassium chloride SA (K-DUR,KLOR-CON) 20 MEQ tablet Take 1 tablet (20 mEq total) by mouth daily. 90 tablet 0   No current facility-administered medications for this visit.    Functional Status:  In your present state of health, do you have any difficulty performing the following activities: 09/10/2014 08/02/2014  Hearing? N Y  Vision? N Y  Difficulty concentrating or making decisions? N Y  Walking or climbing stairs? N N  Dressing or bathing? N N  Doing errands, shopping? Y N  Preparing Food and eating ? Y -  Using the Toilet? Y -   In the past six months, have you accidently leaked urine? N -  Do you have problems with loss of bowel control? N -  Managing your Medications? N -  Managing your Finances? (No Data) -  Housekeeping or managing your Housekeeping? Y -    Fall/Depression Screening:  PHQ 2/9 Scores 09/10/2014 08/07/2014 05/17/2013 10/05/2011 01/15/2011  PHQ - 2 Score 0 0 0 0 0    Assessment: Patient reports having difficulty affording his prescriptions, mainly symbicort and coumadin.  Patient did not want to discuss his income, would like this social worker to talk to his daughter.  Home Health nurse continuing to see patient.  Per patient, he finds this beneficial.  Per patient, he is having difficulty identifying foods to eat that will not interfere with the medication he is on.  Per patient, the [place] nurse has referred him to a dietician.  Plan: Social Worker to research prescription assistance programs that patient may benefit from.  Social Worker to discuss patient's financial information and possible budgeting plan with patient's daughter Matthew Edwards.    Sheralyn Boatman [facility] Care Management [phone]

## 2014-09-11 DIAGNOSIS — I2510 Atherosclerotic heart disease of native coronary artery without angina pectoris: Secondary | ICD-10-CM | POA: Diagnosis not present

## 2014-09-11 DIAGNOSIS — E1140 Type 2 diabetes mellitus with diabetic neuropathy, unspecified: Secondary | ICD-10-CM | POA: Diagnosis not present

## 2014-09-11 DIAGNOSIS — I482 Chronic atrial fibrillation: Secondary | ICD-10-CM | POA: Diagnosis not present

## 2014-09-11 DIAGNOSIS — J449 Chronic obstructive pulmonary disease, unspecified: Secondary | ICD-10-CM | POA: Diagnosis not present

## 2014-09-11 DIAGNOSIS — I5033 Acute on chronic diastolic (congestive) heart failure: Secondary | ICD-10-CM | POA: Diagnosis not present

## 2014-09-11 DIAGNOSIS — I10 Essential (primary) hypertension: Secondary | ICD-10-CM | POA: Diagnosis not present

## 2014-09-11 DIAGNOSIS — F329 Major depressive disorder, single episode, unspecified: Secondary | ICD-10-CM | POA: Diagnosis not present

## 2014-09-11 DIAGNOSIS — Z5181 Encounter for therapeutic drug level monitoring: Secondary | ICD-10-CM | POA: Diagnosis not present

## 2014-09-11 DIAGNOSIS — F0390 Unspecified dementia without behavioral disturbance: Secondary | ICD-10-CM | POA: Diagnosis not present

## 2014-09-13 ENCOUNTER — Ambulatory Visit: Payer: Commercial Managed Care - HMO | Admitting: Family Medicine

## 2014-09-17 DIAGNOSIS — I482 Chronic atrial fibrillation: Secondary | ICD-10-CM | POA: Diagnosis not present

## 2014-09-17 DIAGNOSIS — Z5181 Encounter for therapeutic drug level monitoring: Secondary | ICD-10-CM | POA: Diagnosis not present

## 2014-09-17 DIAGNOSIS — E1140 Type 2 diabetes mellitus with diabetic neuropathy, unspecified: Secondary | ICD-10-CM | POA: Diagnosis not present

## 2014-09-17 DIAGNOSIS — F329 Major depressive disorder, single episode, unspecified: Secondary | ICD-10-CM | POA: Diagnosis not present

## 2014-09-17 DIAGNOSIS — I5033 Acute on chronic diastolic (congestive) heart failure: Secondary | ICD-10-CM | POA: Diagnosis not present

## 2014-09-17 DIAGNOSIS — I10 Essential (primary) hypertension: Secondary | ICD-10-CM | POA: Diagnosis not present

## 2014-09-17 DIAGNOSIS — I2510 Atherosclerotic heart disease of native coronary artery without angina pectoris: Secondary | ICD-10-CM | POA: Diagnosis not present

## 2014-09-17 DIAGNOSIS — J449 Chronic obstructive pulmonary disease, unspecified: Secondary | ICD-10-CM | POA: Diagnosis not present

## 2014-09-17 DIAGNOSIS — F0390 Unspecified dementia without behavioral disturbance: Secondary | ICD-10-CM | POA: Diagnosis not present

## 2014-09-18 ENCOUNTER — Other Ambulatory Visit: Payer: Self-pay | Admitting: *Deleted

## 2014-09-18 NOTE — Patient Outreach (Signed)
[place] [facility]) Care Management  09/18/2014  Matthew Edwards 1940-07-19 [id number]   Phone call to patient's daughter.  Discussed patient's assistance program for Symbicort.  Scheduled home visit for 09/24/13 at 9:00am to complete prescription assistance application and to obtain patient's income statement.   Sheralyn Boatman [facility] Care Management [phone]

## 2014-09-21 ENCOUNTER — Ambulatory Visit (INDEPENDENT_AMBULATORY_CARE_PROVIDER_SITE_OTHER): Payer: Commercial Managed Care - HMO | Admitting: Internal Medicine

## 2014-09-21 DIAGNOSIS — F0390 Unspecified dementia without behavioral disturbance: Secondary | ICD-10-CM | POA: Diagnosis not present

## 2014-09-21 DIAGNOSIS — I482 Chronic atrial fibrillation, unspecified: Secondary | ICD-10-CM

## 2014-09-21 DIAGNOSIS — J449 Chronic obstructive pulmonary disease, unspecified: Secondary | ICD-10-CM | POA: Diagnosis not present

## 2014-09-21 DIAGNOSIS — I10 Essential (primary) hypertension: Secondary | ICD-10-CM | POA: Diagnosis not present

## 2014-09-21 DIAGNOSIS — I5033 Acute on chronic diastolic (congestive) heart failure: Secondary | ICD-10-CM | POA: Diagnosis not present

## 2014-09-21 DIAGNOSIS — Z5181 Encounter for therapeutic drug level monitoring: Secondary | ICD-10-CM

## 2014-09-21 DIAGNOSIS — I359 Nonrheumatic aortic valve disorder, unspecified: Secondary | ICD-10-CM

## 2014-09-21 DIAGNOSIS — E1140 Type 2 diabetes mellitus with diabetic neuropathy, unspecified: Secondary | ICD-10-CM | POA: Diagnosis not present

## 2014-09-21 DIAGNOSIS — I2510 Atherosclerotic heart disease of native coronary artery without angina pectoris: Secondary | ICD-10-CM | POA: Diagnosis not present

## 2014-09-21 DIAGNOSIS — F329 Major depressive disorder, single episode, unspecified: Secondary | ICD-10-CM | POA: Diagnosis not present

## 2014-09-21 LAB — POCT INR: INR: 1.6

## 2014-09-24 DIAGNOSIS — I10 Essential (primary) hypertension: Secondary | ICD-10-CM | POA: Diagnosis not present

## 2014-09-24 DIAGNOSIS — I482 Chronic atrial fibrillation: Secondary | ICD-10-CM | POA: Diagnosis not present

## 2014-09-24 DIAGNOSIS — Z5181 Encounter for therapeutic drug level monitoring: Secondary | ICD-10-CM | POA: Diagnosis not present

## 2014-09-24 DIAGNOSIS — F0390 Unspecified dementia without behavioral disturbance: Secondary | ICD-10-CM | POA: Diagnosis not present

## 2014-09-24 DIAGNOSIS — I2510 Atherosclerotic heart disease of native coronary artery without angina pectoris: Secondary | ICD-10-CM | POA: Diagnosis not present

## 2014-09-24 DIAGNOSIS — E1140 Type 2 diabetes mellitus with diabetic neuropathy, unspecified: Secondary | ICD-10-CM | POA: Diagnosis not present

## 2014-09-24 DIAGNOSIS — J449 Chronic obstructive pulmonary disease, unspecified: Secondary | ICD-10-CM | POA: Diagnosis not present

## 2014-09-24 DIAGNOSIS — I5033 Acute on chronic diastolic (congestive) heart failure: Secondary | ICD-10-CM | POA: Diagnosis not present

## 2014-09-24 DIAGNOSIS — F329 Major depressive disorder, single episode, unspecified: Secondary | ICD-10-CM | POA: Diagnosis not present

## 2014-09-25 ENCOUNTER — Ambulatory Visit: Payer: Commercial Managed Care - HMO | Admitting: *Deleted

## 2014-09-25 DIAGNOSIS — F0390 Unspecified dementia without behavioral disturbance: Secondary | ICD-10-CM | POA: Diagnosis not present

## 2014-09-25 DIAGNOSIS — I2510 Atherosclerotic heart disease of native coronary artery without angina pectoris: Secondary | ICD-10-CM | POA: Diagnosis not present

## 2014-09-25 DIAGNOSIS — Z5181 Encounter for therapeutic drug level monitoring: Secondary | ICD-10-CM | POA: Diagnosis not present

## 2014-09-25 DIAGNOSIS — I482 Chronic atrial fibrillation: Secondary | ICD-10-CM | POA: Diagnosis not present

## 2014-09-25 DIAGNOSIS — J449 Chronic obstructive pulmonary disease, unspecified: Secondary | ICD-10-CM | POA: Diagnosis not present

## 2014-09-25 DIAGNOSIS — F329 Major depressive disorder, single episode, unspecified: Secondary | ICD-10-CM | POA: Diagnosis not present

## 2014-09-25 DIAGNOSIS — I10 Essential (primary) hypertension: Secondary | ICD-10-CM | POA: Diagnosis not present

## 2014-09-25 DIAGNOSIS — E1140 Type 2 diabetes mellitus with diabetic neuropathy, unspecified: Secondary | ICD-10-CM | POA: Diagnosis not present

## 2014-09-25 DIAGNOSIS — I5033 Acute on chronic diastolic (congestive) heart failure: Secondary | ICD-10-CM | POA: Diagnosis not present

## 2014-09-26 ENCOUNTER — Other Ambulatory Visit: Payer: Self-pay | Admitting: *Deleted

## 2014-09-26 DIAGNOSIS — E1140 Type 2 diabetes mellitus with diabetic neuropathy, unspecified: Secondary | ICD-10-CM | POA: Diagnosis not present

## 2014-09-26 DIAGNOSIS — F0390 Unspecified dementia without behavioral disturbance: Secondary | ICD-10-CM | POA: Diagnosis not present

## 2014-09-26 DIAGNOSIS — I10 Essential (primary) hypertension: Secondary | ICD-10-CM | POA: Diagnosis not present

## 2014-09-26 DIAGNOSIS — I5033 Acute on chronic diastolic (congestive) heart failure: Secondary | ICD-10-CM | POA: Diagnosis not present

## 2014-09-26 DIAGNOSIS — I482 Chronic atrial fibrillation: Secondary | ICD-10-CM | POA: Diagnosis not present

## 2014-09-26 DIAGNOSIS — F329 Major depressive disorder, single episode, unspecified: Secondary | ICD-10-CM | POA: Diagnosis not present

## 2014-09-26 DIAGNOSIS — Z5181 Encounter for therapeutic drug level monitoring: Secondary | ICD-10-CM | POA: Diagnosis not present

## 2014-09-26 DIAGNOSIS — J449 Chronic obstructive pulmonary disease, unspecified: Secondary | ICD-10-CM | POA: Diagnosis not present

## 2014-09-26 DIAGNOSIS — I2510 Atherosclerotic heart disease of native coronary artery without angina pectoris: Secondary | ICD-10-CM | POA: Diagnosis not present

## 2014-09-26 NOTE — Patient Outreach (Signed)
[place] [facility]) Care Management  09/26/2014  Matthew Edwards Oct 08, 1940 [id number]  Phone call to patient on [birth year] yo remind him of home visit to complete patient assistance application.  Patient did not have the needed documentation required to complete the application.  Home visit re-scheduled for 10/02/14.  Spoke to patient's daughter as well to remind her of required documentation.    Sheralyn Boatman [facility] Care Management [phone]

## 2014-10-02 ENCOUNTER — Ambulatory Visit: Payer: Commercial Managed Care - HMO | Admitting: *Deleted

## 2014-10-04 ENCOUNTER — Other Ambulatory Visit: Payer: Self-pay | Admitting: *Deleted

## 2014-10-04 NOTE — Patient Outreach (Signed)
[place] [facility]) Care Management  10/04/2014  AELRED CARBY 1940/09/04 [id number]    Phone call from patient's daughter wanting to re-schedule meeting today.  Per patient's daughter, she does not have the needed paperwork to complete the prescription assistance application.  Application to be faxed to her and she will have application completed along with income statements.  She will contact this social worker once the paper work is completed.  Sheralyn Boatman [facility] Care Management [phone]

## 2014-10-04 NOTE — Patient Outreach (Signed)
[place] [facility]) Care Management  10/04/2014  Matthew Edwards [birth year] [id number]    Phone call to patient's daughter [name].  Plans made to meet her today at 1:30pm to collect items needed to completed paperwork for prescription assistance.   Sheralyn Boatman [facility] Care Management [phone]

## 2014-10-05 ENCOUNTER — Telehealth: Payer: Self-pay | Admitting: Cardiology

## 2014-10-05 NOTE — Telephone Encounter (Signed)
Spoke with Matthew Edwards with [facility].  Gave okay to check INR on Monday.

## 2014-10-05 NOTE — Telephone Encounter (Signed)
New message      Pt was scheduled for discharge today with a PT/INR---due to staffing they will not be able to do PT/INR until Monday 10-08-14.  If this is not ok---please call

## 2014-10-09 ENCOUNTER — Ambulatory Visit (INDEPENDENT_AMBULATORY_CARE_PROVIDER_SITE_OTHER): Payer: Commercial Managed Care - HMO | Admitting: Pharmacist Clinician (PhC)/ Clinical Pharmacy Specialist

## 2014-10-09 DIAGNOSIS — F0390 Unspecified dementia without behavioral disturbance: Secondary | ICD-10-CM | POA: Diagnosis not present

## 2014-10-09 DIAGNOSIS — I5033 Acute on chronic diastolic (congestive) heart failure: Secondary | ICD-10-CM | POA: Diagnosis not present

## 2014-10-09 DIAGNOSIS — I10 Essential (primary) hypertension: Secondary | ICD-10-CM | POA: Diagnosis not present

## 2014-10-09 DIAGNOSIS — E1140 Type 2 diabetes mellitus with diabetic neuropathy, unspecified: Secondary | ICD-10-CM | POA: Diagnosis not present

## 2014-10-09 DIAGNOSIS — I359 Nonrheumatic aortic valve disorder, unspecified: Secondary | ICD-10-CM

## 2014-10-09 DIAGNOSIS — I2510 Atherosclerotic heart disease of native coronary artery without angina pectoris: Secondary | ICD-10-CM | POA: Diagnosis not present

## 2014-10-09 DIAGNOSIS — I482 Chronic atrial fibrillation, unspecified: Secondary | ICD-10-CM

## 2014-10-09 DIAGNOSIS — J449 Chronic obstructive pulmonary disease, unspecified: Secondary | ICD-10-CM | POA: Diagnosis not present

## 2014-10-09 DIAGNOSIS — F329 Major depressive disorder, single episode, unspecified: Secondary | ICD-10-CM | POA: Diagnosis not present

## 2014-10-09 DIAGNOSIS — Z5181 Encounter for therapeutic drug level monitoring: Secondary | ICD-10-CM | POA: Diagnosis not present

## 2014-10-09 LAB — POCT INR: INR: 1.5

## 2014-10-11 ENCOUNTER — Encounter: Payer: Self-pay | Admitting: *Deleted

## 2014-10-11 DIAGNOSIS — Z598 Other problems related to housing and economic circumstances: Secondary | ICD-10-CM

## 2014-10-11 DIAGNOSIS — Z599 Problem related to housing and economic circumstances, unspecified: Secondary | ICD-10-CM

## 2014-10-16 ENCOUNTER — Other Ambulatory Visit: Payer: Self-pay | Admitting: *Deleted

## 2014-10-16 DIAGNOSIS — E1169 Type 2 diabetes mellitus with other specified complication: Secondary | ICD-10-CM

## 2014-10-26 ENCOUNTER — Other Ambulatory Visit: Payer: Self-pay | Admitting: *Deleted

## 2014-10-26 NOTE — Patient Outreach (Signed)
[place] [facility]) Care Management  10/26/2014  NOOH MARKUNAS [birth year] [id number]  [place] referral:  Telephone call to primary contact who is listed as Buyer, retail. States she is patient's primary caregiver. . States patient also has support from friends, neighbors, and church members.  Caregiver advised of reason for call and of [facility] care management services. States she is a Automotive engineer and currently handles all of patient's medication management. Has medication box that she uses and no needs regarding medication education.  States he is  currently getting all of medications that he needs.   Caregiver takes patient to all of doctors' appointments and coumadin clinic appointments. States some of his conditions include heart failure, atrial fibrillation, COPD and diabetes. > States patient owns glucometer, blood pressure monitor, scales and cane. States his conditions are currently under control. States she monitors blood sugar, blood pressure, and weight and is aware of COPD and heart failure action plans.   Caregiver declines [facility] RN care management /disease management services at this time.  Clinical social worker is currently working with caregiver for other needs.  Caregiver was given contact number for 24 hr [place].    Plan: Telephonic care coordinator closing out of case; will advise Clinical Social Worker of the above.   Sherrin Daisy, RN BSN [place] Management Coordinator [facility] Care Management  [phone]

## 2014-11-01 ENCOUNTER — Ambulatory Visit (INDEPENDENT_AMBULATORY_CARE_PROVIDER_SITE_OTHER): Payer: Commercial Managed Care - HMO | Admitting: *Deleted

## 2014-11-01 DIAGNOSIS — I482 Chronic atrial fibrillation, unspecified: Secondary | ICD-10-CM

## 2014-11-01 DIAGNOSIS — I359 Nonrheumatic aortic valve disorder, unspecified: Secondary | ICD-10-CM

## 2014-11-01 DIAGNOSIS — Z5181 Encounter for therapeutic drug level monitoring: Secondary | ICD-10-CM | POA: Diagnosis not present

## 2014-11-01 LAB — POCT INR: INR: 1.7

## 2014-11-02 ENCOUNTER — Ambulatory Visit (HOSPITAL_BASED_OUTPATIENT_CLINIC_OR_DEPARTMENT_OTHER): Payer: Commercial Managed Care - HMO | Attending: Cardiology

## 2014-11-02 VITALS — Ht 69.0 in | Wt 243.0 lb

## 2014-11-02 DIAGNOSIS — I509 Heart failure, unspecified: Secondary | ICD-10-CM | POA: Diagnosis present

## 2014-11-02 DIAGNOSIS — Z7901 Long term (current) use of anticoagulants: Secondary | ICD-10-CM

## 2014-11-02 DIAGNOSIS — E119 Type 2 diabetes mellitus without complications: Secondary | ICD-10-CM

## 2014-11-02 DIAGNOSIS — Z952 Presence of prosthetic heart valve: Secondary | ICD-10-CM

## 2014-11-02 DIAGNOSIS — I4891 Unspecified atrial fibrillation: Secondary | ICD-10-CM | POA: Diagnosis not present

## 2014-11-02 DIAGNOSIS — G4733 Obstructive sleep apnea (adult) (pediatric): Secondary | ICD-10-CM | POA: Diagnosis not present

## 2014-11-02 DIAGNOSIS — I493 Ventricular premature depolarization: Secondary | ICD-10-CM | POA: Diagnosis not present

## 2014-11-02 DIAGNOSIS — I10 Essential (primary) hypertension: Secondary | ICD-10-CM

## 2014-11-02 DIAGNOSIS — I359 Nonrheumatic aortic valve disorder, unspecified: Secondary | ICD-10-CM

## 2014-11-02 DIAGNOSIS — G4734 Idiopathic sleep related nonobstructive alveolar hypoventilation: Secondary | ICD-10-CM | POA: Diagnosis not present

## 2014-11-02 DIAGNOSIS — I482 Chronic atrial fibrillation, unspecified: Secondary | ICD-10-CM

## 2014-11-02 DIAGNOSIS — I272 Pulmonary hypertension, unspecified: Secondary | ICD-10-CM

## 2014-11-04 NOTE — Sleep Study (Signed)
NAME: Matthew Edwards DATE OF BIRTH:  1940/09/03 MEDICAL RECORD NUMBER [id number]  LOCATION: [facility]  PHYSICIAN: KELLY,THOMAS A  DATE OF STUDY: 11/02/2014  SLEEP STUDY TYPE: Split Night/Positive Airway Pressure Titration               REFERRING PHYSICIAN: Martinique, Peter M, MD  INDICATION FOR STUDY:  Matthew Edwards is a 74 year old male with known CAD and aortic valve replacement.  He has had issues with heart failure.  EPWORTH SLEEPINESS SCORE:   HEIGHT: _0  (175.3 cm)  WEIGHT: 110.224 kg (243 lb)    Body mass index is 35.87 kg/(m^2).  NECK SIZE: 19 in.  MEDICATIONS:  albuterol (PROVENTIL HFA;VENTOLIN HFA) 108 (90 BASE) MCG/ACT inhaler 2 puff, Every 6 hours PRN atorvastatin (LIPITOR) 20 MG tablet 20 mg, Daily-1800 benazepril (LOTENSIN) 20 MG tablet 20 mg, Daily budesonide-formoterol (SYMBICORT) 80-4.5 MCG/ACT inhaler 2 puff, 2 times daily cholecalciferol (VITAMIN D) 1000 UNITS tablet 1,000 Units, Daily furosemide (LASIX) 40 MG tablet 40 mg, 2 times daily glipiZIDE (GLUCOTROL) 10 MG tablet 10 mg, Daily at bedtime nitroGLYCERIN (NITROSTAT) 0.4 MG SL tablet 0.4 mg, Every 5 min PRN potassium chloride SA (K-DUR,KLOR-CON) 20 MEQ tablet 20 mEq, Daily vitamin C (ASCORBIC ACID) 500 MG tablet 500 mg, Daily at bedtime warfarin (COUMADIN) 5 MG tablet 5 mg, As directed   SLEEP ARCHITECTURE:  The patient met split-night criteria.  On the diagnostic portion of the study, total recording time was 142.5 minutes and total sleep time was 126.5 minutes.  Percent sleep efficiency was 88.8%.  Supine sleep was not achieved.  He slept for 7 minutes (5.5%) in stage I, 119 minutes (94.1%) disease.  In stage II, 0.5 minutes (0.4%, and (in stage III, and 0 minutes in rem sleep.  There were 5 arousals with an index of 2.4.  On the titration portion of the study, the patient slept for 193.5 minutes out of a total recording time of 25.5 minutes.  Sleep efficiency was 94.2%.  The patient  slept for 6 minutes (3.1%) in stage I, 89.5 minutes (46.3%) in stage II, 0 minutes in stage III, and was able to sleep 98 minutes (50.6%) in REM sleep.  He slept 97.9% of the time in the supine position, of which 50.6% was supine in REM sleep  RESPIRATORY DATA:  On the diagnostic portion of the study, there were 1 obstructive apneas, 0 central apneas, 2 mixed apneas, and 55 hypopneas.  The apnea hypopnea index (AHI) was 41.7 per hour which places the patient in the category of severe obstructive sleep apnea.   On the titration portion of the study, CPAP was instituted at 47 m water pressure and was titrated up to 14 cm.  Due to continued events, BiPAP therapy was started at an initial pressure of 15/11 and was titrated up to 20/16.  Events were still present at this high pressure and optimal titration was not achieved.  Snoring was not present.  OXYGEN DATA:  The baseline oxygen saturation was 94%.  The lowest oxygen saturation in non-REM sleep was 86%, and with CPAP titration in REM sleep was 87%.  CARDIAC DATA:  The average heart rate was 56 bpm.  There were occasional PVCs and  the patient was in atrial fibrillation.  MOVEMENT/PARASOMNIA:  On the diagnostic portion of the study, there were 9 periodic limb movements with an index of 4.3.  During CPAP titration, there were 0 periodic limb movements.  IMPRESSION/ RECOMMENDATION:   Severe  obstructive apnea/hypoxia syndrome with a baseline AHI of 41.7 per hour without REM sleep. Respiratory events with an oxygen desaturation to a nadir of 86%. Abnormal sleep architecture related to respiratory events, with absence of REM sleep on the diagnostic portion of the study. Nocturnal myoclonus of questionable clinical significance. Atrial fibrillation with occasional PVCs.  Recommend initial use of BiPAP auto unit with a Bi-Flex/EPR of 3 and an EPAP minimum 11, delta of 4, and an IPAP maximum of 25 with heated humidification.  Recommend download be  obtained in 30 days in sleep clinic evaluation.     Gardner, American Board of Sleep Medicine  ELECTRONICALLY SIGNED ON:  11/04/2014, 1:56 PM [place] PH: [phone]   FX: [phone] [place]

## 2014-11-07 NOTE — Progress Notes (Signed)
Referred to [facility] for BIPAP setup.

## 2014-11-09 ENCOUNTER — Encounter: Payer: Self-pay | Admitting: Cardiology

## 2014-11-13 ENCOUNTER — Encounter: Payer: Self-pay | Admitting: Cardiology

## 2014-11-13 ENCOUNTER — Other Ambulatory Visit: Payer: Self-pay | Admitting: *Deleted

## 2014-11-13 ENCOUNTER — Ambulatory Visit (INDEPENDENT_AMBULATORY_CARE_PROVIDER_SITE_OTHER): Payer: Commercial Managed Care - HMO | Admitting: Cardiology

## 2014-11-13 VITALS — BP 119/70 | HR 81 | Ht 69.0 in | Wt 248.0 lb

## 2014-11-13 DIAGNOSIS — I359 Nonrheumatic aortic valve disorder, unspecified: Secondary | ICD-10-CM | POA: Diagnosis not present

## 2014-11-13 DIAGNOSIS — I10 Essential (primary) hypertension: Secondary | ICD-10-CM

## 2014-11-13 DIAGNOSIS — Z7901 Long term (current) use of anticoagulants: Secondary | ICD-10-CM | POA: Diagnosis not present

## 2014-11-13 DIAGNOSIS — IMO0001 Reserved for inherently not codable concepts without codable children: Secondary | ICD-10-CM

## 2014-11-13 DIAGNOSIS — E119 Type 2 diabetes mellitus without complications: Secondary | ICD-10-CM

## 2014-11-13 DIAGNOSIS — I482 Chronic atrial fibrillation, unspecified: Secondary | ICD-10-CM

## 2014-11-13 DIAGNOSIS — G4733 Obstructive sleep apnea (adult) (pediatric): Secondary | ICD-10-CM

## 2014-11-13 DIAGNOSIS — I2510 Atherosclerotic heart disease of native coronary artery without angina pectoris: Secondary | ICD-10-CM

## 2014-11-13 DIAGNOSIS — E559 Vitamin D deficiency, unspecified: Secondary | ICD-10-CM

## 2014-11-13 DIAGNOSIS — E0843 Diabetes mellitus due to underlying condition with diabetic autonomic (poly)neuropathy: Secondary | ICD-10-CM

## 2014-11-13 DIAGNOSIS — Z954 Presence of other heart-valve replacement: Secondary | ICD-10-CM

## 2014-11-13 DIAGNOSIS — Z952 Presence of prosthetic heart valve: Secondary | ICD-10-CM

## 2014-11-13 HISTORY — DX: Obstructive sleep apnea (adult) (pediatric): G47.33

## 2014-11-13 MED ORDER — POTASSIUM CHLORIDE CRYS ER 20 MEQ PO TBCR: 20.0000 meq | EXTENDED_RELEASE_TABLET | Freq: Every day | ORAL | Status: DC

## 2014-11-13 MED ORDER — FUROSEMIDE 40 MG PO TABS: 40.0000 mg | ORAL_TABLET | Freq: Two times a day (BID) | ORAL | Status: DC

## 2014-11-13 MED ORDER — NITROGLYCERIN 0.4 MG SL SUBL
0.4000 mg | SUBLINGUAL_TABLET | SUBLINGUAL | Status: DC | PRN
Start: 2014-11-13 — End: 2015-07-25

## 2014-11-13 MED ORDER — BENAZEPRIL HCL 20 MG PO TABS: 20.0000 mg | ORAL_TABLET | Freq: Every day | ORAL | Status: DC

## 2014-11-13 NOTE — Patient Instructions (Signed)
We will get you an appointment with Dr. Claiborne Billings for evaluation and treatment of your sleep apnea  Continue your current therapy  I will see you in 4 months

## 2014-11-13 NOTE — Progress Notes (Signed)
[name] Date of Birth: 1940-11-14 Medical Record [id number]  History of Present Illness: [name] is seen for followup CHF. He has a history of coronary disease with prior stenting of the LAD and left circumflex coronary disease in the past. In 2012 he underwent open heart surgery with an LIMA graft to the LAD and aortic valve replacement with a #25 mm Edwards pericardial valve. He has a history of diabetes, hypertension, and hyperlipidemia. He also has a history of chronic atrial fibrillation and is on coumadin.  He was  admitted in  March 2016 with CHF exacerbation. Echo showed normal prosthetic valve function with EF 50-55%. He did have pulmonary HTN with estimated PA pressure of 58 mm Hg. He was diuresed with improvement. His daughter is with him today. He has been doing much better with dietary modification and sodium restriction. Weight is down 7 lbs. He denies any chest pain, SOB, or palpitations. No edema. Recent sleep study shows significant apnea.   Current Outpatient Prescriptions on File Prior to Visit  Medication Sig Dispense Refill  . albuterol (PROVENTIL HFA;VENTOLIN HFA) 108 (90 BASE) MCG/ACT inhaler Inhale 2 puffs into the lungs every 6 (six) hours as needed for wheezing or shortness of breath. 1 Inhaler 2  . atorvastatin (LIPITOR) 20 MG tablet Take 1 tablet (20 mg total) by mouth daily at 6 PM. 90 tablet 3  . budesonide-formoterol (SYMBICORT) 80-4.5 MCG/ACT inhaler Inhale 2 puffs into the lungs 2 (two) times daily. 10.2 g 11  . cholecalciferol (VITAMIN D) 1000 UNITS tablet Take 1 tablet (1,000 Units total) by mouth daily. 90 tablet 0  . glipiZIDE (GLUCOTROL) 10 MG tablet Take 1 tablet (10 mg total) by mouth at bedtime. 90 tablet 0  . vitamin C (ASCORBIC ACID) 500 MG tablet Take 1 tablet (500 mg total) by mouth at bedtime. 90 tablet 3  . warfarin (COUMADIN) 5 MG tablet Take 1 tablet (5 mg total) by mouth as directed. 90 tablet 1   No current facility-administered  medications on file prior to visit.    Allergies  Allergen Reactions  . Pineapple Concentrate Nausea And Vomiting    Past Medical History  Diagnosis Date  . Hyperlipidemia   . Hypertension   . CAD (coronary artery disease) 2012    Lima-LAD  . GERD (gastroesophageal reflux disease)   . S/P AVR     #25 mm Edwards pericardial valve Bioprosthetic. Dr Cyndia Bent.  . Atrial fibrillation   . HEART FAILURE, CONGESTIVE UNSPEC 02/23/2007    Qualifier: Diagnosis of  By: Mellody Drown MD, [facility]    . TOBACCO DEPENDENCE 07/29/2006    Qualifier: History of  By: Carlena Sax  MD, Colletta [place]    . COPD (chronic obstructive pulmonary disease)   . Type II diabetes mellitus   . Situational depression     "son passed 11/2013"    Past Surgical History  Procedure Laterality Date  . Aortic valve replacement  07/2010    notes 07/28/2010  . Cardiac valve replacement    . Coronary artery bypass graft  07/2010    "1"/notes 07/28/2010  . Coronary angioplasty with stent placement  2006; 10/2006    "2"; 1/notes 10/01/2010  . Cardiac catheterization  06/2010    /notes 07/01/2010    History  Smoking status  . Former Smoker -- 0.50 packs/day for 46 years  . Types: Cigarettes  . Quit date: 07/08/2004  Smokeless tobacco  . Never Used    History  Alcohol Use No  Family History  Problem Relation Age of Onset  . Heart disease Mother     Review of Systems: As noted in history of present illness. All other systems were reviewed and are negative.  Physical Exam: BP 119/70 mmHg  Pulse 81  Ht 5\' 9"  (1.753 m)  Wt 112.492 kg (248 lb)  BMI 36.61 kg/m2 He is a pleasant black male in no acute distress. The HEENT exam is normal. The carotids are 2+ without bruits.  There is no thyromegaly.  There is no JVD.  The lungs are clear.   His median sternotomy scar has healed well. The heart exam reveals an irregular rate and rhythm  with a normal S1 and S2.  There is a soft systolic ejection sound at the right upper sternal border.   The PMI is not displaced.   Abdominal exam reveals good bowel sounds.  There is no hepatosplenomegaly or tenderness.  There are no masses.  Exam of the legs reveal no clubbing, cyanosis. There is 1+ edema L>R. The distal pulses are intact.  Cranial nerves II - XII are intact.  Motor and sensory functions are intact.  The gait is normal.  LABORATORY DATA:  Lab Results  Component Value Date   WBC 6.0 08/05/2014   HGB 15.3 08/05/2014   HCT 47.5 08/05/2014   PLT 135* 08/05/2014   GLUCOSE 66* 08/07/2014   CHOL 117 12/13/2013   TRIG 147 12/13/2013   HDL 38* 12/13/2013   LDLDIRECT 81 12/23/2012   LDLCALC 50 12/13/2013   ALT 18 08/02/2014   AST 23 08/02/2014   NA 138 08/07/2014   K 4.3 08/07/2014   CL 98 08/07/2014   CREATININE 1.11 08/07/2014   BUN 19 08/07/2014   CO2 33* 08/07/2014   TSH 1.417 08/02/2014   PSA 0.34 05/27/2009   INR 1.7 11/01/2014   HGBA1C 7.7 06/28/2014   MICROALBUR 0.50 11/25/2011   INR 1.7 on 11/01/14.  Assessment / Plan: 1. Coronary disease status post remote stenting of the LAD and left circumflex. Status post CABG in 2012 with an LIMA graft to the LAD. He is asymptomatic. Continue risk factor modification.  2. Chronic diastolic CHF. Dietary sodium intake was a contributing factor. Reviewed recommendation for sodium restriction. Continue ACEi and diuretic therapy. Overall doing very well.  3. Aortic stenosis status post aortic valve replacement with a pericardial valve. Recent Echo showed normal valve function.  4. Hyperlipidemia. He is on Lipitor.   5. Diabetes mellitus type 2.   6. Atrial fibrillation-permanent. Rate is well controlled. He is anticoagulated with Coumadin. Dose adjusted by coumadin clinic.  7. Pulmonary HTN by Echo. Suspect OSA is playing a role.  8. Severe OSA. Will schedule evaluation with Dr. Claiborne Billings.   I will follow up in 4 months.

## 2014-11-15 ENCOUNTER — Ambulatory Visit (INDEPENDENT_AMBULATORY_CARE_PROVIDER_SITE_OTHER): Payer: Commercial Managed Care - HMO | Admitting: *Deleted

## 2014-11-15 DIAGNOSIS — Z5181 Encounter for therapeutic drug level monitoring: Secondary | ICD-10-CM | POA: Diagnosis not present

## 2014-11-15 DIAGNOSIS — I10 Essential (primary) hypertension: Secondary | ICD-10-CM | POA: Diagnosis not present

## 2014-11-15 DIAGNOSIS — I4891 Unspecified atrial fibrillation: Secondary | ICD-10-CM | POA: Diagnosis not present

## 2014-11-15 DIAGNOSIS — I2510 Atherosclerotic heart disease of native coronary artery without angina pectoris: Secondary | ICD-10-CM | POA: Diagnosis not present

## 2014-11-15 DIAGNOSIS — I482 Chronic atrial fibrillation, unspecified: Secondary | ICD-10-CM

## 2014-11-15 DIAGNOSIS — E785 Hyperlipidemia, unspecified: Secondary | ICD-10-CM

## 2014-11-15 DIAGNOSIS — I359 Nonrheumatic aortic valve disorder, unspecified: Secondary | ICD-10-CM

## 2014-11-15 LAB — POCT INR: INR: 2.3

## 2014-11-16 ENCOUNTER — Ambulatory Visit: Payer: Commercial Managed Care - HMO | Admitting: Cardiology

## 2014-11-22 ENCOUNTER — Other Ambulatory Visit: Payer: Self-pay | Admitting: *Deleted

## 2014-11-22 NOTE — Patient Outreach (Signed)
[facility]) Care Management  11/22/2014  Matthew Edwards 06-06-1940 [id number]   Telephone call primary contact who is listed as [name] Shoemaker/daughter. States she is patient's primary caregiver. .  Caregiver states that she has decided not to follow through with the prescription assistance request stating that she had completed a similar request before and was not approved due to patient's income.  Patient's daughter continues to handle all of patient's medication management. Has medication box that she uses andstates that he is currently able to get all of his  medications.   Caregiver declines further [facility] social work service  at this time. However, daughter did request information for day prgorams to increase patient's socialization.  This Education officer, museum provided daughter with information regarding the [employer] via email.  Patient's case to be closed to social work at this time.   Sheralyn Boatman [facility] Care Management [phone]

## 2014-12-10 ENCOUNTER — Other Ambulatory Visit: Payer: Self-pay | Admitting: *Deleted

## 2014-12-10 DIAGNOSIS — E0843 Diabetes mellitus due to underlying condition with diabetic autonomic (poly)neuropathy: Secondary | ICD-10-CM

## 2014-12-10 DIAGNOSIS — E118 Type 2 diabetes mellitus with unspecified complications: Secondary | ICD-10-CM

## 2014-12-10 DIAGNOSIS — I10 Essential (primary) hypertension: Secondary | ICD-10-CM

## 2014-12-10 DIAGNOSIS — E119 Type 2 diabetes mellitus without complications: Secondary | ICD-10-CM

## 2014-12-10 DIAGNOSIS — I482 Chronic atrial fibrillation, unspecified: Secondary | ICD-10-CM

## 2014-12-10 MED ORDER — GLIPIZIDE 10 MG PO TABS: 10.0000 mg | ORAL_TABLET | Freq: Every day | ORAL | Status: DC

## 2014-12-10 MED ORDER — ATORVASTATIN CALCIUM 20 MG PO TABS: 20.0000 mg | ORAL_TABLET | Freq: Every day | ORAL | Status: DC

## 2014-12-10 MED ORDER — WARFARIN SODIUM 5 MG PO TABS: 5.0000 mg | ORAL_TABLET | ORAL | Status: DC

## 2014-12-10 MED ORDER — ACCU-CHEK AVIVA PLUS W/DEVICE KIT: 1.0000 [IU] | PACK | Freq: Three times a day (TID) | Status: DC

## 2014-12-10 MED ORDER — FUROSEMIDE 40 MG PO TABS: 40.0000 mg | ORAL_TABLET | Freq: Two times a day (BID) | ORAL | Status: DC

## 2014-12-10 MED ORDER — GLUCOSE BLOOD VI STRP: ORAL_STRIP | Status: DC

## 2014-12-10 MED ORDER — BENAZEPRIL HCL 20 MG PO TABS: 20.0000 mg | ORAL_TABLET | Freq: Every day | ORAL | Status: DC

## 2014-12-10 NOTE — Telephone Encounter (Signed)
Patient also Rx for Accu-Chek Aviva Plus Meter. Derl Barrow, RN

## 2014-12-12 ENCOUNTER — Other Ambulatory Visit: Payer: Self-pay | Admitting: Family Medicine

## 2014-12-12 MED ORDER — WARFARIN SODIUM 5 MG PO TABS: 5.0000 mg | ORAL_TABLET | ORAL | Status: DC

## 2014-12-12 NOTE — Telephone Encounter (Signed)
Received another fax from [facility] regarding Rx for Warfarin 5 mg tab.  Refill was approved on 12/10/14 by PCP.  Will resend refill again.  Derl Barrow, RN

## 2014-12-12 NOTE — Addendum Note (Signed)
Addended by: Derl Barrow on: 12/12/2014 09:33 AM   Modules accepted: Orders

## 2014-12-16 ENCOUNTER — Encounter (HOSPITAL_COMMUNITY): Payer: Self-pay

## 2014-12-16 ENCOUNTER — Emergency Department (HOSPITAL_COMMUNITY): Payer: Commercial Managed Care - HMO

## 2014-12-16 ENCOUNTER — Emergency Department (HOSPITAL_COMMUNITY)
Admission: EM | Admit: 2014-12-16 | Discharge: 2014-12-16 | Disposition: A | Discharge status: Home / Self Care | Payer: Commercial Managed Care - HMO | Attending: Emergency Medicine | Admitting: Emergency Medicine

## 2014-12-16 DIAGNOSIS — E785 Hyperlipidemia, unspecified: Secondary | ICD-10-CM | POA: Insufficient documentation

## 2014-12-16 DIAGNOSIS — Z954 Presence of other heart-valve replacement: Secondary | ICD-10-CM | POA: Insufficient documentation

## 2014-12-16 DIAGNOSIS — M6281 Muscle weakness (generalized): Secondary | ICD-10-CM | POA: Diagnosis not present

## 2014-12-16 DIAGNOSIS — Z8669 Personal history of other diseases of the nervous system and sense organs: Secondary | ICD-10-CM | POA: Diagnosis not present

## 2014-12-16 DIAGNOSIS — J449 Chronic obstructive pulmonary disease, unspecified: Secondary | ICD-10-CM | POA: Insufficient documentation

## 2014-12-16 DIAGNOSIS — I509 Heart failure, unspecified: Secondary | ICD-10-CM | POA: Diagnosis not present

## 2014-12-16 DIAGNOSIS — Z9889 Other specified postprocedural states: Secondary | ICD-10-CM | POA: Diagnosis not present

## 2014-12-16 DIAGNOSIS — Y998 Other external cause status: Secondary | ICD-10-CM | POA: Insufficient documentation

## 2014-12-16 DIAGNOSIS — Z7951 Long term (current) use of inhaled steroids: Secondary | ICD-10-CM | POA: Insufficient documentation

## 2014-12-16 DIAGNOSIS — Y9389 Activity, other specified: Secondary | ICD-10-CM | POA: Diagnosis not present

## 2014-12-16 DIAGNOSIS — S8001XA Contusion of right knee, initial encounter: Secondary | ICD-10-CM | POA: Diagnosis not present

## 2014-12-16 DIAGNOSIS — I10 Essential (primary) hypertension: Secondary | ICD-10-CM | POA: Insufficient documentation

## 2014-12-16 DIAGNOSIS — W1839XA Other fall on same level, initial encounter: Secondary | ICD-10-CM | POA: Diagnosis not present

## 2014-12-16 DIAGNOSIS — Z8719 Personal history of other diseases of the digestive system: Secondary | ICD-10-CM | POA: Insufficient documentation

## 2014-12-16 DIAGNOSIS — Z7901 Long term (current) use of anticoagulants: Secondary | ICD-10-CM | POA: Insufficient documentation

## 2014-12-16 DIAGNOSIS — Z8659 Personal history of other mental and behavioral disorders: Secondary | ICD-10-CM | POA: Insufficient documentation

## 2014-12-16 DIAGNOSIS — Y9289 Other specified places as the place of occurrence of the external cause: Secondary | ICD-10-CM | POA: Insufficient documentation

## 2014-12-16 DIAGNOSIS — E119 Type 2 diabetes mellitus without complications: Secondary | ICD-10-CM | POA: Diagnosis not present

## 2014-12-16 DIAGNOSIS — Z79899 Other long term (current) drug therapy: Secondary | ICD-10-CM | POA: Insufficient documentation

## 2014-12-16 DIAGNOSIS — Z87891 Personal history of nicotine dependence: Secondary | ICD-10-CM | POA: Insufficient documentation

## 2014-12-16 DIAGNOSIS — S8991XA Unspecified injury of right lower leg, initial encounter: Secondary | ICD-10-CM | POA: Diagnosis not present

## 2014-12-16 DIAGNOSIS — I2510 Atherosclerotic heart disease of native coronary artery without angina pectoris: Secondary | ICD-10-CM | POA: Diagnosis not present

## 2014-12-16 DIAGNOSIS — Z951 Presence of aortocoronary bypass graft: Secondary | ICD-10-CM | POA: Diagnosis not present

## 2014-12-16 DIAGNOSIS — I4891 Unspecified atrial fibrillation: Secondary | ICD-10-CM | POA: Diagnosis not present

## 2014-12-16 DIAGNOSIS — M25461 Effusion, right knee: Secondary | ICD-10-CM | POA: Diagnosis not present

## 2014-12-16 LAB — DIFFERENTIAL
Basophils Absolute: 0 10*3/uL (ref 0.0–0.1)
Basophils Relative: 0 % (ref 0–1)
Eosinophils Absolute: 0.1 10*3/uL (ref 0.0–0.7)
Eosinophils Relative: 1 % (ref 0–5)
Lymphocytes Relative: 24 % (ref 12–46)
Lymphs Abs: 1.7 10*3/uL (ref 0.7–4.0)
Monocytes Absolute: 0.5 10*3/uL (ref 0.1–1.0)
Monocytes Relative: 8 % (ref 3–12)
Neutro Abs: 4.8 10*3/uL (ref 1.7–7.7)
Neutrophils Relative %: 67 % (ref 43–77)

## 2014-12-16 LAB — CBC
HCT: 46.2 % (ref 39.0–52.0)
Hemoglobin: 14.5 g/dL (ref 13.0–17.0)
MCH: 29.1 pg (ref 26.0–34.0)
MCHC: 31.4 g/dL (ref 30.0–36.0)
MCV: 92.8 fL (ref 78.0–100.0)
Platelets: 130 10*3/uL — ABNORMAL LOW (ref 150–400)
RBC: 4.98 MIL/uL (ref 4.22–5.81)
RDW: 14.5 % (ref 11.5–15.5)
WBC: 7.2 10*3/uL (ref 4.0–10.5)

## 2014-12-16 LAB — COMPREHENSIVE METABOLIC PANEL
ALT: 15 U/L — ABNORMAL LOW (ref 17–63)
AST: 24 U/L (ref 15–41)
Albumin: 4.3 g/dL (ref 3.5–5.0)
Alkaline Phosphatase: 65 U/L (ref 38–126)
Anion gap: 5 (ref 5–15)
BUN: 15 mg/dL (ref 6–20)
CO2: 30 mmol/L (ref 22–32)
Calcium: 9.9 mg/dL (ref 8.9–10.3)
Chloride: 104 mmol/L (ref 101–111)
Creatinine, Ser: 1.05 mg/dL (ref 0.61–1.24)
GFR calc Af Amer: >60 mL/min (ref >60)
GFR calc non Af Amer: >60 mL/min (ref >60)
Glucose, Bld: 121 mg/dL — ABNORMAL HIGH (ref 65–99)
Potassium: 3.9 mmol/L (ref 3.5–5.1)
Sodium: 139 mmol/L (ref 135–145)
Total Bilirubin: 0.9 mg/dL (ref 0.3–1.2)
Total Protein: 7.7 g/dL (ref 6.5–8.1)

## 2014-12-16 LAB — PROTIME-INR
INR: 3.08 — ABNORMAL HIGH (ref 0.00–1.49)
Prothrombin Time: 31.2 seconds — ABNORMAL HIGH (ref 11.6–15.2)

## 2014-12-16 LAB — TROPONIN I: Troponin I: <0.03 ng/mL (ref <0.031)

## 2014-12-16 LAB — APTT: aPTT: 43 seconds — ABNORMAL HIGH (ref 24–37)

## 2014-12-16 LAB — ETHANOL: Alcohol, Ethyl (B): <5 mg/dL (ref <5)

## 2014-12-16 NOTE — ED Notes (Addendum)
MD at bedside. 

## 2014-12-16 NOTE — ED Notes (Signed)
Patient transported to MRI 

## 2014-12-16 NOTE — ED Provider Notes (Signed)
CSN: [id number]     Arrival date & time 12/16/14  1346 History   First MD Initiated Contact with Patient 12/16/14 1511     Chief Complaint  Patient presents with  . Knee Pain   HPI Pt states he was going to stand last night and his leg felt like it fell asleep.  This happened around 10:30 pm.    He was on a stool and when he went to get up his leg gave away and he fell on his knee.  His leg would not bear weight.  No numbness in his arm.  No trouble with his speech.  His leg was the only thing affected.  He is having pain in the right knee area.  He is using his cane to get around but it does not seem to be working as well as usual still.  Past Medical History  Diagnosis Date  . Hyperlipidemia   . Hypertension   . CAD (coronary artery disease) 2012    Lima-LAD  . GERD (gastroesophageal reflux disease)   . S/P AVR     #25 mm Edwards pericardial valve Bioprosthetic. Dr Cyndia Bent.  . Atrial fibrillation   . HEART FAILURE, CONGESTIVE UNSPEC 02/23/2007    Qualifier: Diagnosis of  By: Mellody Drown MD, [facility]    . TOBACCO DEPENDENCE 07/29/2006    Qualifier: History of  By: Carlena Sax  MD, Colletta [place]    . COPD (chronic obstructive pulmonary disease)   . Type II diabetes mellitus   . Situational depression     "son passed 11/2013"  . OSA (obstructive sleep apnea) 11/13/2014   Past Surgical History  Procedure Laterality Date  . Aortic valve replacement  07/2010    notes 07/28/2010  . Cardiac valve replacement    . Coronary artery bypass graft  07/2010    "1"/notes 07/28/2010  . Coronary angioplasty with stent placement  2006; 10/2006    "2"; 1/notes 10/01/2010  . Cardiac catheterization  06/2010    /notes 07/01/2010   Family History  Problem Relation Age of Onset  . Heart disease Mother    History  Substance Use Topics  . Smoking status: Former Smoker -- 0.50 packs/day for 46 years    Types: Cigarettes    Quit date: 07/08/2004  . Smokeless tobacco: Never Used  . Alcohol Use: No    Review of Systems   All other systems reviewed and are negative.     Allergies  Pineapple concentrate  Home Medications   Prior to Admission medications   Medication Sig Start Date End Date Taking? Authorizing Provider  albuterol (PROVENTIL HFA;VENTOLIN HFA) 108 (90 BASE) MCG/ACT inhaler Inhale 2 puffs into the lungs every 6 (six) hours as needed for wheezing or shortness of breath. 08/05/14  Yes Frazier Richards, MD  atorvastatin (LIPITOR) 20 MG tablet Take 1 tablet (20 mg total) by mouth daily at 6 PM. 12/10/14  Yes Donnamae Jude, MD  benazepril (LOTENSIN) 20 MG tablet Take 1 tablet (20 mg total) by mouth daily. 12/10/14  Yes Donnamae Jude, MD  Blood Glucose Monitoring Suppl (ACCU-CHEK AVIVA PLUS) W/DEVICE KIT 1 Units by Does not apply route 3 (three) times daily. 12/10/14  Yes Donnamae Jude, MD  budesonide-formoterol (SYMBICORT) 80-4.5 MCG/ACT inhaler Inhale 2 puffs into the lungs 2 (two) times daily. 08/05/14  Yes Frazier Richards, MD  cholecalciferol (VITAMIN D) 1000 UNITS tablet Take 1 tablet (1,000 Units total) by mouth daily. 08/07/14  Yes Hilton Sinclair, MD  furosemide (  LASIX) 40 MG tablet Take 1 tablet (40 mg total) by mouth 2 (two) times daily. 12/10/14  Yes Donnamae Jude, MD  glipiZIDE (GLUCOTROL) 10 MG tablet Take 1 tablet (10 mg total) by mouth at bedtime. 12/10/14  Yes Donnamae Jude, MD  glucose blood (ACCU-CHEK AVIVA PLUS) test strip Use to check Blood Sugars 4 times/day 12/10/14  Yes Donnamae Jude, MD  nitroGLYCERIN (NITROSTAT) 0.4 MG SL tablet Place 1 tablet (0.4 mg total) under the tongue every 5 (five) minutes as needed. For chest pain 11/13/14  Yes Peter M Martinique, MD  potassium chloride SA (K-DUR,KLOR-CON) 20 MEQ tablet Take 1 tablet (20 mEq total) by mouth daily. 11/13/14  Yes Peter M Martinique, MD  vitamin C (ASCORBIC ACID) 500 MG tablet Take 1 tablet (500 mg total) by mouth at bedtime. Patient taking differently: Take 500 mg by mouth every morning.  12/13/13  Yes Donnamae Jude, MD  warfarin (COUMADIN) 5  MG tablet Take 1 tablet (5 mg total) by mouth as directed. Patient taking differently: Take 7.5-10 mg by mouth daily at 6 PM. TAKE 2 TABLETS (10 MG) ON MONDAYS, WEDNESDAYS AND FRIDAYS.  THEN TAKE 1 AND ONE-HALF OF A TABLET ON ALL OTHER DAYS . 12/12/14  Yes Donnamae Jude, MD   BP 129/69 mmHg  Pulse 69  Temp(Src) 98.6 F (37 C) (Oral)  Resp 16  SpO2 95% Physical Exam  Constitutional: He is oriented to person, place, and time. He appears well-developed and well-nourished. No distress.  HENT:  Head: Normocephalic and atraumatic.  Right Ear: External ear normal.  Left Ear: External ear normal.  Mouth/Throat: Oropharynx is clear and moist.  Eyes: Conjunctivae are normal. Right eye exhibits no discharge. Left eye exhibits no discharge. No scleral icterus.  Neck: Neck supple. No tracheal deviation present.  Cardiovascular: Normal rate, regular rhythm and intact distal pulses.   Pulmonary/Chest: Effort normal and breath sounds normal. No stridor. No respiratory distress. He has no wheezes. He has no rales.  Abdominal: Soft. Bowel sounds are normal. He exhibits no distension. There is no tenderness. There is no rebound and no guarding.  Musculoskeletal: He exhibits no edema or tenderness.  Neurological: He is alert and oriented to person, place, and time. He has normal strength. No cranial nerve deficit (No facial droop, extraocular movements intact, tongue midline ) or sensory deficit. He exhibits normal muscle tone. He displays no seizure activity. Coordination normal.  No pronator drift bilateral upper extrem, able hold to hold left leg off the bed for 5 seconds, able to hold right leg off the bed as well but there is drift within the 5 second period of time, sensation intact in all extremities, no visual field cuts, no left or right sided neglect, normal finger-nose exam bilaterally, no nystagmus noted   Skin: Skin is warm and dry. No rash noted.  Psychiatric: He has a normal mood and affect.   Nursing note and vitals reviewed.   ED Course  Procedures (including critical care time) Labs Review Labs Reviewed  PROTIME-INR - Abnormal; Notable for the following:    Prothrombin Time 31.2 (*)    INR 3.08 (*)    All other components within normal limits  APTT - Abnormal; Notable for the following:    aPTT 43 (*)    All other components within normal limits  CBC - Abnormal; Notable for the following:    Platelets 130 (*)    All other components within normal limits  COMPREHENSIVE METABOLIC  PANEL - Abnormal; Notable for the following:    Glucose, Bld 121 (*)    ALT 15 (*)    All other components within normal limits  ETHANOL  DIFFERENTIAL  TROPONIN I    Imaging Review Mr Brain Wo Contrast  12/16/2014   CLINICAL DATA:  74 year old male with right lower extremity weakness since last night. On Coumadin. Initial encounter.  EXAM: MRI HEAD WITHOUT CONTRAST  TECHNIQUE: Multiplanar, multiecho pulse sequences of the brain and surrounding structures were obtained without intravenous contrast.  COMPARISON:  CT head without contrast 06/14/2010.  FINDINGS: Cerebral volume is normal. No restricted diffusion to suggest acute infarction. No midline shift, mass effect, evidence of mass lesion, ventriculomegaly, extra-axial collection or acute intracranial hemorrhage. Cervicomedullary junction and pituitary are within normal limits. Major intracranial vascular flow voids are within normal limits.  Mild for age, patchy cerebral white matter T2 and FLAIR hyperintensity. Suggestion of chronic hemosiderin in the anterior left superior frontal gyrus (series 9, image 20). No cortical encephalomalacia identified. Mild to moderate for age T2 heterogeneity in the deep gray matter nuclei, but appears mostly due to perivascular spaces. Mild for age T2 hyperintensity in the pons. Cerebellum is within normal limits.  Visible internal auditory structures appear normal. Visualized paranasal sinuses and mastoids are  clear. Orbits soft tissues appear normal. Scalp soft tissues within normal limits. Negative visualized cervical spine. Normal bone marrow signal.  IMPRESSION: 1.  No acute intracranial abnormality. 2. Mild to moderate for age nonspecific signal changes in the brain, most commonly due to chronic small vessel disease.   Electronically Signed   By: Genevie Ann M.D.   On: 12/16/2014 17:13   Dg Knee Complete 4 Views Right  12/16/2014   CLINICAL DATA:  Right knee pain and pressure, knee injury  EXAM: RIGHT KNEE - COMPLETE 4+ VIEW  COMPARISON:  None.  FINDINGS: Four views of the right knee submitted. No acute fracture or subluxation. Small joint effusion. Spurring of patella. Mild narrowing of patellofemoral joint space. Mild calcifications of patellar tendon.  IMPRESSION: No acute fracture or subluxation. Small joint effusion. Mild degenerative changes.   Electronically Signed   By: Lahoma Crocker M.D.   On: 12/16/2014 15:13     EKG Interpretation   Date/Time:  Sunday December 16 2014 15:52:31 EDT Ventricular Rate:  75 PR Interval:    QRS Duration: 80 QT Interval:  375 QTC Calculation: 419 R Axis:   63 Text Interpretation:  Atrial fibrillation No significant change since last  tracing Confirmed by Lennyn Bellanca  MD-J, Rmani Kapusta ([id number]) on 12/16/2014 3:56:30 PM      MDM   Final diagnoses:  Knee contusion, right, initial encounter    MRI does not show any acute stroke.  Difficulty walking may be related to the mild effusion, injury and degenerative changes.  Pt still is able to bear weight.  Doubt other significant injury.  At this time there does not appear to be any evidence of an acute emergency medical condition and the patient appears stable for discharge with appropriate outpatient follow up.    Dorie Rank, MD 12/16/14 [phone]

## 2014-12-16 NOTE — ED Notes (Signed)
He c/o right knee pain and pressure.  He is here with his daughter, who expresses worry that this may be an intraarticular bleed "from his coumadin".  She states years ago he had a left ankle bleed caused by his anticoagulation (for a-fib.).

## 2014-12-16 NOTE — Discharge Instructions (Signed)
Contusion °A contusion is a deep bruise. Contusions happen when an injury causes bleeding under the skin. Signs of bruising include pain, puffiness (swelling), and discolored skin. The contusion may turn blue, purple, or yellow. °HOME CARE  °· Put ice on the injured area. °¨ Put ice in a plastic bag. °¨ Place a towel between your skin and the bag. °¨ Leave the ice on for 15-20 minutes, 03-04 times a day. °· Only take medicine as told by your doctor. °· Rest the injured area. °· If possible, raise (elevate) the injured area to lessen puffiness. °GET HELP RIGHT AWAY IF:  °· You have more bruising or puffiness. °· You have pain that is getting worse. °· Your puffiness or pain is not helped by medicine. °MAKE SURE YOU:  °· Understand these instructions. °· Will watch your condition. °· Will get help right away if you are not doing well or get worse. °Document Released: 11/04/2007 Document Revised: 08/10/2011 Document Reviewed: 03/23/2011 °ExitCare® Patient Information ©2015 [employer]. This information is not intended to replace advice given to you by your health care provider. Make sure you discuss any questions you have with your health care provider. ° °

## 2015-01-03 ENCOUNTER — Other Ambulatory Visit: Payer: Self-pay | Admitting: Family Medicine

## 2015-01-04 ENCOUNTER — Ambulatory Visit (INDEPENDENT_AMBULATORY_CARE_PROVIDER_SITE_OTHER): Payer: Commercial Managed Care - HMO | Admitting: *Deleted

## 2015-01-04 DIAGNOSIS — E785 Hyperlipidemia, unspecified: Secondary | ICD-10-CM

## 2015-01-04 DIAGNOSIS — I4891 Unspecified atrial fibrillation: Secondary | ICD-10-CM | POA: Diagnosis not present

## 2015-01-04 DIAGNOSIS — Z5181 Encounter for therapeutic drug level monitoring: Secondary | ICD-10-CM

## 2015-01-04 DIAGNOSIS — I482 Chronic atrial fibrillation, unspecified: Secondary | ICD-10-CM

## 2015-01-04 DIAGNOSIS — I10 Essential (primary) hypertension: Secondary | ICD-10-CM | POA: Diagnosis not present

## 2015-01-04 DIAGNOSIS — I2510 Atherosclerotic heart disease of native coronary artery without angina pectoris: Secondary | ICD-10-CM | POA: Diagnosis not present

## 2015-01-04 DIAGNOSIS — I359 Nonrheumatic aortic valve disorder, unspecified: Secondary | ICD-10-CM

## 2015-01-04 LAB — POCT INR: INR: 6.2

## 2015-01-04 LAB — PROTIME-INR
INR: 6.1 ratio (ref 0.8–1.0)
Prothrombin Time: 64.5 s (ref 9.6–13.1)

## 2015-01-10 DIAGNOSIS — G4733 Obstructive sleep apnea (adult) (pediatric): Secondary | ICD-10-CM | POA: Diagnosis not present

## 2015-01-18 ENCOUNTER — Ambulatory Visit (INDEPENDENT_AMBULATORY_CARE_PROVIDER_SITE_OTHER): Payer: Commercial Managed Care - HMO | Admitting: *Deleted

## 2015-01-18 DIAGNOSIS — I4891 Unspecified atrial fibrillation: Secondary | ICD-10-CM

## 2015-01-18 DIAGNOSIS — I2510 Atherosclerotic heart disease of native coronary artery without angina pectoris: Secondary | ICD-10-CM

## 2015-01-18 DIAGNOSIS — I10 Essential (primary) hypertension: Secondary | ICD-10-CM | POA: Diagnosis not present

## 2015-01-18 DIAGNOSIS — E785 Hyperlipidemia, unspecified: Secondary | ICD-10-CM | POA: Diagnosis not present

## 2015-01-18 DIAGNOSIS — Z5181 Encounter for therapeutic drug level monitoring: Secondary | ICD-10-CM | POA: Diagnosis not present

## 2015-01-18 DIAGNOSIS — I482 Chronic atrial fibrillation, unspecified: Secondary | ICD-10-CM

## 2015-01-18 DIAGNOSIS — I359 Nonrheumatic aortic valve disorder, unspecified: Secondary | ICD-10-CM

## 2015-01-18 LAB — POCT INR: INR: 3.1

## 2015-01-28 ENCOUNTER — Encounter: Payer: Self-pay | Admitting: Family Medicine

## 2015-01-28 ENCOUNTER — Ambulatory Visit (INDEPENDENT_AMBULATORY_CARE_PROVIDER_SITE_OTHER): Payer: Commercial Managed Care - HMO | Admitting: Family Medicine

## 2015-01-28 VITALS — BP 137/79 | HR 69 | Temp 98.2°F | Ht 69.0 in | Wt 248.8 lb

## 2015-01-28 DIAGNOSIS — E118 Type 2 diabetes mellitus with unspecified complications: Secondary | ICD-10-CM

## 2015-01-28 DIAGNOSIS — R2681 Unsteadiness on feet: Secondary | ICD-10-CM

## 2015-01-28 DIAGNOSIS — Z1211 Encounter for screening for malignant neoplasm of colon: Secondary | ICD-10-CM

## 2015-01-28 DIAGNOSIS — I482 Chronic atrial fibrillation, unspecified: Secondary | ICD-10-CM

## 2015-01-28 DIAGNOSIS — E559 Vitamin D deficiency, unspecified: Secondary | ICD-10-CM

## 2015-01-28 DIAGNOSIS — E119 Type 2 diabetes mellitus without complications: Secondary | ICD-10-CM

## 2015-01-28 DIAGNOSIS — R413 Other amnesia: Secondary | ICD-10-CM | POA: Diagnosis not present

## 2015-01-28 DIAGNOSIS — K5900 Constipation, unspecified: Secondary | ICD-10-CM | POA: Insufficient documentation

## 2015-01-28 DIAGNOSIS — E0843 Diabetes mellitus due to underlying condition with diabetic autonomic (poly)neuropathy: Secondary | ICD-10-CM

## 2015-01-28 DIAGNOSIS — K5901 Slow transit constipation: Secondary | ICD-10-CM | POA: Diagnosis not present

## 2015-01-28 DIAGNOSIS — I10 Essential (primary) hypertension: Secondary | ICD-10-CM

## 2015-01-28 LAB — POCT GLYCOSYLATED HEMOGLOBIN (HGB A1C): Hemoglobin A1C: 6.4

## 2015-01-28 MED ORDER — VITAMIN D3 25 MCG (1000 UNIT) PO TABS: 1000.0000 [IU] | ORAL_TABLET | Freq: Every day | ORAL | Status: DC

## 2015-01-28 MED ORDER — ATORVASTATIN CALCIUM 20 MG PO TABS: 20.0000 mg | ORAL_TABLET | Freq: Every day | ORAL | Status: DC

## 2015-01-28 MED ORDER — FUROSEMIDE 40 MG PO TABS: 40.0000 mg | ORAL_TABLET | Freq: Two times a day (BID) | ORAL | Status: DC

## 2015-01-28 MED ORDER — WARFARIN SODIUM 5 MG PO TABS: 5.0000 mg | ORAL_TABLET | ORAL | Status: DC

## 2015-01-28 MED ORDER — POTASSIUM CHLORIDE CRYS ER 20 MEQ PO TBCR: 20.0000 meq | EXTENDED_RELEASE_TABLET | Freq: Every day | ORAL | Status: DC

## 2015-01-28 MED ORDER — BENAZEPRIL HCL 20 MG PO TABS: 20.0000 mg | ORAL_TABLET | Freq: Every day | ORAL | Status: DC

## 2015-01-28 MED ORDER — BUDESONIDE-FORMOTEROL FUMARATE 80-4.5 MCG/ACT IN AERO: 2.0000 | INHALATION_SPRAY | Freq: Two times a day (BID) | RESPIRATORY_TRACT | Status: DC

## 2015-01-28 MED ORDER — GLIPIZIDE 10 MG PO TABS: 10.0000 mg | ORAL_TABLET | Freq: Every day | ORAL | Status: DC

## 2015-01-28 NOTE — Progress Notes (Signed)
Subjective:   Matthew Edwards is a 74 y.o. male with a history of aortic valve replacement, afib on anticoagulation, COPD, T2DM with neuropathy,  pulmonary HTN, HFpEF (EF 55%) and cognitive impairment presenting with concern for memory, ambulation, and  Here for  Chief Complaint  Patient presents with  . Memory Loss   #Memory: patient is at home alone most of the day. He still drives short distances to the store. Daughter said they "restricted his driving because he got lost once" -more anxious -delays and slow to respond to questions.  - no dysuria or polyuria  #Balance - [facility] after [facility] - No recent falls but slightly more unsteady with shuffling gait.   #T2DM: complaint with medications. Needs foot exam and optho exam.   #HFpHF: no changes in weight. Reports compliance with medication.   #Afib on anti coagulation: seen at [facility] for anti coag  #constipation; daughter is asking about last colonosopy (appears to have been in 2009). Pt with severe constipation, medication dependent to have BM.   Review of Systems:   Per HPI. All other systems reviewed and are negative expect as in HPI   PMH, PSH, Medications, Allergies, SocialHx and FHx reviewed and updated in EMR- marked as reviewed 01/28/2015   Objective:  BP 137/79 mmHg  Pulse 69  Temp(Src) 98.2 F (36.8 C) (Oral)  Ht 5\' 9"  (1.753 m)  Wt 248 lb 12.8 oz (112.855 kg)  BMI 36.72 kg/m2  Gen:  74 y.o. male in NAD. Speaking in full sentences. Good eye contact HEENT: NCAT, MMM, EOMI, PERRL, anicteric sclerae.  CV: RR Resp: Normal WOB. Non-labored, CTAB, no wheezes noted GI: Soft, NTND, BS present, no guarding or organomegaly Ext: WWP, no edema MSK: Intact gait. Full ROM Neuro: Alert and oriented, speech normal. See images below.  Pysch: Normal mood and affect.     Lab Results  Component Value Date   HGBA1C 6.4 01/28/2015        Assessment:     Matthew Edwards is a 74  y.o. male with multiple chronic medical conditions here for memory and mutliple other concerns.     Plan:   #Memory Loss- low VA SLUMS supports Dementia with score of 13.  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  - Will get UA to r/o UTI though this is unlikely given progressive nature of sx - Discussed at length with daughter  - Reviewed PACE program - Reviewed having CM involved - This patient is likely becoming progressively more unsafe to be at home alone and drive.   # Constipation: chronic and worsening. Likely normal variant but did have concern pathology in 2009 that was not followed with the recommended 5 year screening. -referral to GI - Pt on coumadin which make procedure more complex.   #Balance/Gait: Likely medical debility but will have PT perform formal assessment. Could benefit from services to help with gait stability for falls prevetnion.   #Afib- stable. Follow up in [facility]  #T2DM: stable and well controlled today. No changes   Orders Placed This Encounter  Procedures  . Urinalysis with Culture Reflex    Standing Status: Future     Number of Occurrences:      Standing Expiration Date: 01/28/2016  . Ambulatory referral to Physical Therapy    Referral Priority:  Routine    Referral Type:  Physical Medicine    Referral Reason:  Specialty Services Required    Requested Specialty:  Physical Therapy    Number of Visits Requested:  1  . Ambulatory referral to Gastroenterology    Referral Priority:  Routine    Referral Type:  Consultation    Referral Reason:  Specialty Services Required    Number of Visits Requested:  1  . HgB A1c    Caren Macadam, MD, [facility] OB Fellow, Faculty [facility] 01/28/2015  6:11 PM

## 2015-01-29 ENCOUNTER — Telehealth: Payer: Self-pay | Admitting: Clinical

## 2015-02-01 ENCOUNTER — Ambulatory Visit (INDEPENDENT_AMBULATORY_CARE_PROVIDER_SITE_OTHER): Payer: Commercial Managed Care - HMO | Admitting: *Deleted

## 2015-02-01 ENCOUNTER — Other Ambulatory Visit (INDEPENDENT_AMBULATORY_CARE_PROVIDER_SITE_OTHER): Payer: Commercial Managed Care - HMO

## 2015-02-01 DIAGNOSIS — I359 Nonrheumatic aortic valve disorder, unspecified: Secondary | ICD-10-CM

## 2015-02-01 DIAGNOSIS — Z5181 Encounter for therapeutic drug level monitoring: Secondary | ICD-10-CM | POA: Diagnosis not present

## 2015-02-01 DIAGNOSIS — I10 Essential (primary) hypertension: Secondary | ICD-10-CM

## 2015-02-01 DIAGNOSIS — E785 Hyperlipidemia, unspecified: Secondary | ICD-10-CM

## 2015-02-01 DIAGNOSIS — I482 Chronic atrial fibrillation, unspecified: Secondary | ICD-10-CM

## 2015-02-01 DIAGNOSIS — E099 Drug or chemical induced diabetes mellitus without complications: Secondary | ICD-10-CM | POA: Diagnosis not present

## 2015-02-01 DIAGNOSIS — I4891 Unspecified atrial fibrillation: Secondary | ICD-10-CM | POA: Diagnosis not present

## 2015-02-01 DIAGNOSIS — I2510 Atherosclerotic heart disease of native coronary artery without angina pectoris: Secondary | ICD-10-CM | POA: Diagnosis not present

## 2015-02-01 LAB — POCT URINALYSIS DIPSTICK
Bilirubin, UA: NEGATIVE
Blood, UA: NEGATIVE
Glucose, UA: NEGATIVE
Ketones, UA: NEGATIVE
Leukocytes, UA: NEGATIVE
Nitrite, UA: NEGATIVE
Protein, UA: NEGATIVE
Spec Grav, UA: 1.025
Urobilinogen, UA: 0.2
pH, UA: 5.5

## 2015-02-01 LAB — POCT INR: INR: 1.5

## 2015-02-02 LAB — URINE CULTURE
Colony Count: NO GROWTH
Organism ID, Bacteria: NO GROWTH

## 2015-02-05 NOTE — Telephone Encounter (Signed)
error 

## 2015-02-10 DIAGNOSIS — G4733 Obstructive sleep apnea (adult) (pediatric): Secondary | ICD-10-CM | POA: Diagnosis not present

## 2015-02-26 DIAGNOSIS — H04123 Dry eye syndrome of bilateral lacrimal glands: Secondary | ICD-10-CM | POA: Diagnosis not present

## 2015-02-26 DIAGNOSIS — H524 Presbyopia: Secondary | ICD-10-CM | POA: Diagnosis not present

## 2015-02-26 DIAGNOSIS — H52223 Regular astigmatism, bilateral: Secondary | ICD-10-CM | POA: Diagnosis not present

## 2015-02-26 DIAGNOSIS — E119 Type 2 diabetes mellitus without complications: Secondary | ICD-10-CM | POA: Diagnosis not present

## 2015-02-26 DIAGNOSIS — H2513 Age-related nuclear cataract, bilateral: Secondary | ICD-10-CM | POA: Diagnosis not present

## 2015-02-26 DIAGNOSIS — H5203 Hypermetropia, bilateral: Secondary | ICD-10-CM | POA: Diagnosis not present

## 2015-02-26 LAB — HM DIABETES EYE EXAM

## 2015-03-12 DIAGNOSIS — G4733 Obstructive sleep apnea (adult) (pediatric): Secondary | ICD-10-CM | POA: Diagnosis not present

## 2015-03-15 ENCOUNTER — Encounter: Payer: Self-pay | Admitting: *Deleted

## 2015-03-19 ENCOUNTER — Ambulatory Visit (INDEPENDENT_AMBULATORY_CARE_PROVIDER_SITE_OTHER): Payer: Commercial Managed Care - HMO | Admitting: Pharmacist Clinician (PhC)/ Clinical Pharmacy Specialist

## 2015-03-19 ENCOUNTER — Ambulatory Visit (INDEPENDENT_AMBULATORY_CARE_PROVIDER_SITE_OTHER): Payer: Commercial Managed Care - HMO | Admitting: Cardiology

## 2015-03-19 ENCOUNTER — Encounter: Payer: Self-pay | Admitting: Cardiology

## 2015-03-19 VITALS — BP 150/86 | HR 66 | Ht 69.0 in | Wt 247.6 lb

## 2015-03-19 DIAGNOSIS — E785 Hyperlipidemia, unspecified: Secondary | ICD-10-CM

## 2015-03-19 DIAGNOSIS — I4819 Other persistent atrial fibrillation: Secondary | ICD-10-CM

## 2015-03-19 DIAGNOSIS — Z5181 Encounter for therapeutic drug level monitoring: Secondary | ICD-10-CM | POA: Diagnosis not present

## 2015-03-19 DIAGNOSIS — Z7901 Long term (current) use of anticoagulants: Secondary | ICD-10-CM | POA: Diagnosis not present

## 2015-03-19 DIAGNOSIS — I359 Nonrheumatic aortic valve disorder, unspecified: Secondary | ICD-10-CM

## 2015-03-19 DIAGNOSIS — G4733 Obstructive sleep apnea (adult) (pediatric): Secondary | ICD-10-CM

## 2015-03-19 DIAGNOSIS — Z954 Presence of other heart-valve replacement: Secondary | ICD-10-CM

## 2015-03-19 DIAGNOSIS — I272 Other secondary pulmonary hypertension: Secondary | ICD-10-CM

## 2015-03-19 DIAGNOSIS — I481 Persistent atrial fibrillation: Secondary | ICD-10-CM

## 2015-03-19 DIAGNOSIS — I4891 Unspecified atrial fibrillation: Secondary | ICD-10-CM | POA: Diagnosis not present

## 2015-03-19 DIAGNOSIS — I2510 Atherosclerotic heart disease of native coronary artery without angina pectoris: Secondary | ICD-10-CM

## 2015-03-19 DIAGNOSIS — I10 Essential (primary) hypertension: Secondary | ICD-10-CM

## 2015-03-19 DIAGNOSIS — I482 Chronic atrial fibrillation, unspecified: Secondary | ICD-10-CM

## 2015-03-19 DIAGNOSIS — Z952 Presence of prosthetic heart valve: Secondary | ICD-10-CM

## 2015-03-19 LAB — POCT INR: INR: 2.5

## 2015-03-19 NOTE — Progress Notes (Signed)
Matthew Edwards Date of Birth: June 19, 1940 Medical Record #[id number]  History of Present Illness: Matthew Edwards is seen for followup CHF. He has a history of coronary disease with prior stenting of the LAD and left circumflex coronary disease in the past. In 2012 he underwent open heart surgery with an LIMA graft to the LAD and aortic valve replacement with a #25 mm Edwards pericardial valve. He has a history of diabetes, hypertension, and hyperlipidemia. He also has a history of chronic atrial fibrillation and is on coumadin. He has severe sleep apnea. He was  admitted in  March 2016 with CHF exacerbation. Echo showed normal prosthetic valve function with EF 50-55%. He did have pulmonary HTN with estimated PA pressure of 58 mm Hg.  On follow up today he is doing well from a cardiac standpoint. He denies any chest pain, SOB, or palpitations. No edema. Weight has been stable. On our last visit he was supposed to see Dr. Claiborne Billings for management of his sleep apnea but this did not happen. Complaints of his throat getting sore at night on CPAP and thinks its related to the water. Complains of constipation. INRs in August were fluctuating. Last checked Sept. 2.   Current Outpatient Prescriptions on File Prior to Visit  Medication Sig Dispense Refill  . albuterol (PROVENTIL HFA;VENTOLIN HFA) 108 (90 BASE) MCG/ACT inhaler Inhale 2 puffs into the lungs every 6 (six) hours as needed for wheezing or shortness of breath. 1 Inhaler 2  . atorvastatin (LIPITOR) 20 MG tablet Take 1 tablet (20 mg total) by mouth daily at 6 PM. 90 tablet 3  . benazepril (LOTENSIN) 20 MG tablet Take 1 tablet (20 mg total) by mouth daily. 90 tablet 3  . Blood Glucose Monitoring Suppl (ACCU-CHEK AVIVA PLUS) W/DEVICE KIT 1 Units by Does not apply route 3 (three) times daily. 1 kit 0  . budesonide-formoterol (SYMBICORT) 80-4.5 MCG/ACT inhaler Inhale 2 puffs into the lungs 2 (two) times daily. 10 Inhaler 11  . cholecalciferol (VITAMIN D) 1000  UNITS tablet Take 1 tablet (1,000 Units total) by mouth daily. 90 tablet 3  . furosemide (LASIX) 40 MG tablet Take 1 tablet (40 mg total) by mouth 2 (two) times daily. 90 tablet 3  . glipiZIDE (GLUCOTROL) 10 MG tablet Take 1 tablet (10 mg total) by mouth at bedtime. 90 tablet 3  . glucose blood (ACCU-CHEK AVIVA PLUS) test strip Use to check Blood Sugars 4 times/day 120 each 3  . nitroGLYCERIN (NITROSTAT) 0.4 MG SL tablet Place 1 tablet (0.4 mg total) under the tongue every 5 (five) minutes as needed. For chest pain 25 tablet 6  . potassium chloride SA (K-DUR,KLOR-CON) 20 MEQ tablet Take 1 tablet (20 mEq total) by mouth daily. 90 tablet 3  . vitamin C (ASCORBIC ACID) 500 MG tablet Take 1 tablet (500 mg total) by mouth at bedtime. (Patient taking differently: Take 500 mg by mouth every morning. ) 90 tablet 3  . warfarin (COUMADIN) 5 MG tablet Take 1 tablet (5 mg total) by mouth as directed. 90 tablet 4   No current facility-administered medications on file prior to visit.    Allergies  Allergen Reactions  . Pineapple Concentrate Nausea And Vomiting    Past Medical History  Diagnosis Date  . Hyperlipidemia   . Hypertension   . CAD (coronary artery disease) 2012    [place]-LAD  . GERD (gastroesophageal reflux disease)   . S/P AVR     #25 mm Edwards pericardial valve Bioprosthetic. Dr  Bartle.  . Atrial fibrillation ([place])   . HEART FAILURE, CONGESTIVE UNSPEC 02/23/2007    Qualifier: Diagnosis of  By: Mellody Drown MD, [facility]    . TOBACCO DEPENDENCE 07/29/2006    Qualifier: History of  By: Carlena Sax  MD, [facility]    . COPD (chronic obstructive pulmonary disease) ([place])   . Type II diabetes mellitus ([place])   . Situational depression     "son passed 11/2013"  . OSA (obstructive sleep apnea) 11/13/2014    Past Surgical History  Procedure Laterality Date  . Aortic valve replacement  07/2010    notes 07/28/2010  . Cardiac valve replacement    . Coronary artery bypass graft  07/2010    "1"/notes 07/28/2010   . Coronary angioplasty with stent placement  2006; 10/2006    "2"; 1/notes 10/01/2010  . Cardiac catheterization  06/2010    /notes 07/01/2010    History  Smoking status  . Former Smoker -- 0.50 packs/day for 46 years  . Types: Cigarettes  . Quit date: 07/08/2004  Smokeless tobacco  . Never Used    History  Alcohol Use No    Family History  Problem Relation Age of Onset  . Heart disease Mother     Review of Systems: As noted in history of present illness. All other systems were reviewed and are negative.  Physical Exam: BP 150/86 mmHg  Pulse 66  Ht _0  (1.753 m)  Wt 112.311 kg (247 lb 9.6 oz)  BMI 36.55 kg/m2 He is a pleasant black male in no acute distress. The HEENT exam is normal. The carotids are 2+ without bruits.  There is no thyromegaly.  There is no JVD.  The lungs are clear.   His median sternotomy scar has healed well. The heart exam reveals an irregular rate and rhythm  with a normal S1 and S2.  There is a soft systolic ejection sound at the right upper sternal border.  The PMI is not displaced.   Abdominal exam reveals good bowel sounds.  There is no hepatosplenomegaly or tenderness.  There are no masses.  Exam of the legs reveal no clubbing, cyanosis. There is 1+ edema L>R. The distal pulses are intact.  Cranial nerves II - XII are intact.  Motor and sensory functions are intact.  The gait is normal.  LABORATORY DATA:  Lab Results  Component Value Date   WBC 7.2 12/16/2014   HGB 14.5 12/16/2014   HCT 46.2 12/16/2014   PLT 130* 12/16/2014   GLUCOSE 121* 12/16/2014   CHOL 117 12/13/2013   TRIG 147 12/13/2013   HDL 38* 12/13/2013   LDLDIRECT 81 12/23/2012   LDLCALC 50 12/13/2013   ALT 15* 12/16/2014   AST 24 12/16/2014   NA 139 12/16/2014   K 3.9 12/16/2014   CL 104 12/16/2014   CREATININE 1.05 12/16/2014   BUN 15 12/16/2014   CO2 30 12/16/2014   TSH 1.417 08/02/2014   PSA 0.34 05/27/2009   INR 2.5 03/19/2015   HGBA1C 6.4 01/28/2015   MICROALBUR  0.50 11/25/2011   INR today 2.5  Assessment / Plan: 1. Coronary disease status post remote stenting of the LAD and left circumflex. Status post CABG in 2012 with an LIMA graft to the LAD. He is asymptomatic. Continue risk factor modification.  2. Chronic diastolic CHF. Dietary sodium intake was a contributing factor. Reviewed recommendation for sodium restriction. Continue ACEi and diuretic therapy. Overall doing very well.  3. Aortic stenosis status post aortic valve replacement with  a pericardial valve. Last  Echo showed normal valve function.  4. Hyperlipidemia. He is on Lipitor.   5. Diabetes mellitus type 2.   6. Atrial fibrillation-permanent. Rate is well controlled. He is anticoagulated with Coumadin.INR therapeutic today. Dose adjusted by coumadin clinic.  7. Pulmonary HTN by Echo. Suspect OSA is playing a role.  8. Severe OSA. Scheduled appt with Dr. Claiborne Billings in November for management.   I will follow up in 4 months.

## 2015-03-19 NOTE — Patient Instructions (Signed)
We will arrange follow up for your sleep apnea  Continue your current medication  I will see you in 4 months.

## 2015-04-04 NOTE — Patient Outreach (Signed)
[facility]) Care Management  04/04/2015  AZEL BENEDETTI 07/01/1940 [id number]   Notification from [facility], LCSW to clsoe case.  .thncl

## 2015-04-12 DIAGNOSIS — G4733 Obstructive sleep apnea (adult) (pediatric): Secondary | ICD-10-CM | POA: Diagnosis not present

## 2015-04-17 ENCOUNTER — Ambulatory Visit (INDEPENDENT_AMBULATORY_CARE_PROVIDER_SITE_OTHER): Payer: Commercial Managed Care - HMO | Admitting: Cardiovascular Disease

## 2015-04-17 ENCOUNTER — Telehealth: Payer: Self-pay | Admitting: *Deleted

## 2015-04-17 VITALS — BP 133/81 | HR 59 | Ht 69.0 in | Wt 247.7 lb

## 2015-04-17 DIAGNOSIS — I10 Essential (primary) hypertension: Secondary | ICD-10-CM

## 2015-04-17 DIAGNOSIS — I482 Chronic atrial fibrillation, unspecified: Secondary | ICD-10-CM

## 2015-04-17 DIAGNOSIS — Z954 Presence of other heart-valve replacement: Secondary | ICD-10-CM | POA: Diagnosis not present

## 2015-04-17 DIAGNOSIS — G4733 Obstructive sleep apnea (adult) (pediatric): Secondary | ICD-10-CM | POA: Diagnosis not present

## 2015-04-17 DIAGNOSIS — Z952 Presence of prosthetic heart valve: Secondary | ICD-10-CM

## 2015-04-17 NOTE — Telephone Encounter (Signed)
Faxed order for mask to [facility]. @ [phone].

## 2015-04-17 NOTE — Patient Instructions (Signed)
Your physician recommends that you schedule a follow-up appointment in: February sleep clinic with Dr. Claiborne Billings.

## 2015-04-18 ENCOUNTER — Other Ambulatory Visit: Payer: Self-pay | Admitting: Family Medicine

## 2015-04-19 ENCOUNTER — Encounter: Payer: Self-pay | Admitting: Cardiovascular Disease

## 2015-04-19 NOTE — Progress Notes (Signed)
Patient ID: Matthew Edwards, male   DOB: 06/29/1940, 74 y.o.   MRN: [id number]     HPI: Matthew Edwards  is a 74 y.o. male  Who is followed by [name] for cardiology care.  He recently was found to have obstructive sleep apnea initiated BiPAP therapy.  He presents for sleep clinic evaluation.   Matthew Edwards is a 74 year old male with known CAD who is status post prior stenting of his LAD and left circumflex.  In 2012 he underwent CABG surgery with a LIMA to the LAD and aortic valve replacement with a #25 mm Edwards pericardial valve.  He has a history of diabetes mellitus, hypertension and hyperlipidemia, as well as chronic atrial fibrillation.  Referred for diagnostic sleep study which was done as a split-night protocol.  This confirmed severe obstructive sleep apnea with an AHI 41.7 per hour.  He was started on CPAP therapy , but due to continued events.  He was switched to BiPAP and ultimately titrated up to 20/16, pressure.  Events were still present at this high pressure and as result a BiPAP auto unit with by Flex/EPR of 3 and a pet minimum of 11, delta for an IPAP maximum of 25 with diffuse location was recommended.   [name] is his DME. A download from 02/16/2015 through 03/03/2015 showed that he was meeting compliance with reference to usage stays be 94%.  However, he was not being compliant in days.  Use greater than 4 hours , being only 31%.  He was averaging only 3 hours 18 minutes of BiPAP use per night. He has been using a fullface mask Interfit F10, size large but his download indicates extensive leak which may be contributing to his high AHI of 22.6.  However, since initiating CPAP therapy has felt improved with reference to fatigability and hypersomnolence. He presents now for evaluation.  An Epworth scale was calculated in the office today this endorsed at 5 and shown below   Epworth Sleepiness Scale: Situation   Chance of Dozing/Sleeping (0 = never , 1 = slight chance , 2 = moderate  chance , 3 = high chance )   sitting and reading 1   watching TV 0   sitting inactive in a public place 0   being a passenger in a motor vehicle for an hour or more 1   lying down in the afternoon 3   sitting and talking to someone 0   sitting quietly after lunch (no alcohol) 0   while stopped for a few minutes in traffic as the driver 0   Total Score  5    Past Medical History  Diagnosis Date  . Hyperlipidemia   . Hypertension   . CAD (coronary artery disease) 2012    [place]-LAD  . GERD (gastroesophageal reflux disease)   . S/P AVR     #25 mm Edwards pericardial valve Bioprosthetic. Dr Cyndia Bent.  . Atrial fibrillation ([place])   . HEART FAILURE, CONGESTIVE UNSPEC 02/23/2007    Qualifier: Diagnosis of  By: Mellody Drown MD, [facility]    . TOBACCO DEPENDENCE 07/29/2006    Qualifier: History of  By: Carlena Sax  MD, [facility]    . COPD (chronic obstructive pulmonary disease) ([place])   . Type II diabetes mellitus ([place])   . Situational depression     "son passed 11/2013"  . OSA (obstructive sleep apnea) 11/13/2014    Past Surgical History  Procedure Laterality Date  . Aortic valve replacement  07/2010    notes  07/28/2010  . Cardiac valve replacement    . Coronary artery bypass graft  07/2010    "1"/notes 07/28/2010  . Coronary angioplasty with stent placement  2006; 10/2006    "2"; 1/notes 10/01/2010  . Cardiac catheterization  06/2010    Archie Endo 07/01/2010    Allergies  Allergen Reactions  . Pineapple Concentrate Nausea And Vomiting    Current Outpatient Prescriptions  Medication Sig Dispense Refill  . atorvastatin (LIPITOR) 20 MG tablet Take 1 tablet (20 mg total) by mouth daily at 6 PM. 90 tablet 3  . benazepril (LOTENSIN) 20 MG tablet Take 1 tablet (20 mg total) by mouth daily. 90 tablet 3  . Blood Glucose Monitoring Suppl (ACCU-CHEK AVIVA PLUS) W/DEVICE KIT 1 Units by Does not apply route 3 (three) times daily. 1 kit 0  . budesonide-formoterol (SYMBICORT) 80-4.5 MCG/ACT inhaler Inhale 2 puffs into  the lungs 2 (two) times daily. 10 Inhaler 11  . cholecalciferol (VITAMIN D) 1000 UNITS tablet Take 1 tablet (1,000 Units total) by mouth daily. 90 tablet 3  . furosemide (LASIX) 40 MG tablet Take 1 tablet (40 mg total) by mouth 2 (two) times daily. 90 tablet 3  . glipiZIDE (GLUCOTROL) 10 MG tablet Take 1 tablet (10 mg total) by mouth at bedtime. 90 tablet 3  . nitroGLYCERIN (NITROSTAT) 0.4 MG SL tablet Place 1 tablet (0.4 mg total) under the tongue every 5 (five) minutes as needed. For chest pain 25 tablet 6  . potassium chloride SA (K-DUR,KLOR-CON) 20 MEQ tablet Take 1 tablet (20 mEq total) by mouth daily. 90 tablet 3  . vitamin C (ASCORBIC ACID) 500 MG tablet Take 1 tablet (500 mg total) by mouth at bedtime. (Patient taking differently: Take 500 mg by mouth every morning. ) 90 tablet 3  . warfarin (COUMADIN) 5 MG tablet Take 1 tablet (5 mg total) by mouth as directed. 90 tablet 4  . ACCU-CHEK AVIVA PLUS test strip USE TO CHECK BLOOD SUGARS FOUR TIMES DAILY 400 each 3   No current facility-administered medications for this visit.    Social History   Social History  . Marital Status: Legally Separated    Spouse Name: N/A  . Number of Children: 7  . Years of Education: 11   Occupational History  . Retired-[employer]   . [employer]    Social History Main Topics  . Smoking status: Former Smoker -- 0.50 packs/day for 46 years    Types: Cigarettes    Quit date: 07/08/2004  . Smokeless tobacco: Never Used  . Alcohol Use: No  . Drug Use: No  . Sexual Activity: Not on file   Other Topics Concern  . Not on file   Social History Narrative   Health Care POA:    Emergency Contact: Daughter, [name]   End of Life Plan:    Who lives with you: self   Any pets: none   Diet: Patient has a varied diet of protein, starch, and vegetables.   Exercise: Pt has not regular exercise routine.   Seatbelts: Pt reports wearing seatbelt when in vehile.   Nancy Fetter Exposure/Protection:    Hobbies: fishing,  tv          Family History  Problem Relation Age of Onset  . Heart disease Mother      ROS General: Negative; No fevers, chills, or night sweats HEENT: Negative; No changes in vision or hearing, sinus congestion, difficulty swallowing Pulmonary: Negative; No cough, wheezing, shortness of breath, hemoptysis Cardiovascular:  See  history of present illness: No recent chest pain. History of chronic atrial fibrillation. GI: Negative; No nausea, vomiting, diarrhea, or abdominal pain GU: Negative; No dysuria, hematuria, or difficulty voiding Musculoskeletal: Negative; no myalgias, joint pain, or weakness Hematologic: Negative; no easy bruising, bleeding Endocrine: Negative; no heat/cold intolerance Neuro: Negative; no changes in balance, headaches Skin: Negative; No rashes or skin lesions Psychiatric: Negative; No behavioral problems, depression Sleep:  Positive for severe obstructive sleep apnea, now on BiPAP therapy; No daytime sleepiness, hypersomnolence, bruxism, restless legs, hypnogognic hallucinations, no cataplexy   Physical Exam BP 133/81 mmHg  Pulse 59  Ht _0  (1.753 m)  Wt 247 lb 11.2 oz (112.356 kg)  BMI 36.56 kg/m2  Wt Readings from Last 3 Encounters:  04/17/15 247 lb 11.2 oz (112.356 kg)  03/19/15 247 lb 9.6 oz (112.311 kg)  01/28/15 248 lb 12.8 oz (112.855 kg)   General: Alert, oriented, no distress.  Skin: normal turgor, no rashes HEENT: Normocephalic, atraumatic. Pupils round and reactive; sclera anicteric; extraocular muscles intact; Fundi  Without hemorrhages or exudates Nose without nasal septal hypertrophy Mouth/Parynx benign; Mallinpatti scale 3 Neck: No JVD, no carotid briuts Lungs: clear to ausculatation and percussion; no wheezing or rales  Chest wall: No tenderness to palpation Heart:  Irregular rhythm with controlled rate.  1/6 systolic murmur in the right upper sternal border., s1 s2 normal ; .  No rubs thrills or heaves Abdomen: soft, nontender;  no hepatosplenomehaly, BS+; abdominal aorta nontender and not dilated by palpation. Back: No CVA tenderness Pulses 2+ Extremities: no clubbinbg cyanosis or edema, Homan's sign negative  Neurologic: grossly nonfocal; cranial nerves intact. Psychological: Normal affect and mood.   LABS:  BMP Latest Ref Rng 12/16/2014 08/07/2014 08/05/2014  Glucose 65 - 99 mg/dL 121(H) 66(L) 95  BUN 6 - 20 mg/dL _1 Creatinine 0.61 - 1.24 mg/dL 1.05 1.11 1.10  Sodium 135 - 145 mmol/L 139 138 138  Potassium 3.5 - 5.1 mmol/L 3.9 4.3 4.0  Chloride 101 - 111 mmol/L 104 98 97  CO2 22 - 32 mmol/L 30 33(H) 34(H)  Calcium 8.9 - 10.3 mg/dL 9.9 10.2 9.8     Hepatic Function Latest Ref Rng 12/16/2014 08/02/2014 06/28/2014  Total Protein 6.5 - 8.1 g/dL 7.7 6.8 6.6  Albumin 3.5 - 5.0 g/dL 4.3 4.0 3.9  AST 15 - 41 U/L _2 ALT 17 - 63 U/L 15(L) 18 15  Alk Phosphatase 38 - 126 U/L 65 86 82  Total Bilirubin 0.3 - 1.2 mg/dL 0.9 0.9 0.6  Bilirubin, Direct - - - -     CBC Latest Ref Rng 12/16/2014 08/05/2014 08/04/2014  WBC 4.0 - 10.5 K/uL 7.2 6.0 6.1  Hemoglobin 13.0 - 17.0 g/dL 14.5 15.3 14.6  Hematocrit 39.0 - 52.0 % 46.2 47.5 46.4  Platelets 150 - 400 K/uL 130(L) 135(L) 135(L)     Lipid Panel     Component Value Date/Time   CHOL 117 12/13/2013 1607   TRIG 147 12/13/2013 1607   HDL 38* 12/13/2013 1607   CHOLHDL 3.1 12/13/2013 1607   VLDL 29 12/13/2013 1607   LDLCALC 50 12/13/2013 1607   LDLDIRECT 81 12/23/2012 1638     RADIOLOGY: No results found.    ASSESSMENT AND PLAN:  Matthew Edwards is a 74 year old male who was found to have severe obstructive sleep apnea on his split-night /positive airway pressure titration study of 11/02/2014. He has now been on BiPAP therapy.  His maximum average IPAP pressure  is 15.2  and maximum average CPAP pressure 11.1. He has significant leak.  Inspection of his mask indicates that his mask cushion is ripped which undoubtedly is contributing to his significant  mask  Leak  And elevated residual AHI of 22.6.  He also has had difficulty with the cold air and has not been using heated humidification. I spent over 35 minutes with the patient and his daughter. He needs a new mask.  We went over proper mask fitting and that he should keep the mask on and pulled the tubing off when he goes to the bathroom so that he would not have to reinsert the mask upon returning.  We discussed with him in detail the importance of attaining CPAP therapy for the entire duration of sleep and particularly since the  Preponderance of REM sleep occurs in the second half of night. We discussed requirements of meeting Medicare compliance.  I have recommended that we change his tubing to a climate line heated coil tubing.  He will obtain a new mask. I discussed the adverse effects of sleep apnea on sleep architecture as well as its effects on his cardiovascular health and if left untreated.  I answered all his questions.  A new download will be obtained to document Medicare compliance following his new mask.  I will see him in sleep clinic in February and  further recommendations will be made at that time.   Time spent: 35 minutes  Troy Sine, MD, [facility]  04/19/2015 9:13 PM

## 2015-04-24 DIAGNOSIS — G4733 Obstructive sleep apnea (adult) (pediatric): Secondary | ICD-10-CM | POA: Diagnosis not present

## 2015-05-12 DIAGNOSIS — G4733 Obstructive sleep apnea (adult) (pediatric): Secondary | ICD-10-CM | POA: Diagnosis not present

## 2015-05-18 ENCOUNTER — Emergency Department (HOSPITAL_COMMUNITY): Payer: Commercial Managed Care - HMO

## 2015-05-18 ENCOUNTER — Encounter (HOSPITAL_COMMUNITY): Payer: Self-pay | Admitting: *Deleted

## 2015-05-18 ENCOUNTER — Emergency Department (HOSPITAL_COMMUNITY)
Admission: EM | Admit: 2015-05-18 | Discharge: 2015-05-18 | Disposition: A | Discharge status: Home / Self Care | Payer: Commercial Managed Care - HMO | Attending: Emergency Medicine | Admitting: Emergency Medicine

## 2015-05-18 DIAGNOSIS — J449 Chronic obstructive pulmonary disease, unspecified: Secondary | ICD-10-CM | POA: Insufficient documentation

## 2015-05-18 DIAGNOSIS — Z87891 Personal history of nicotine dependence: Secondary | ICD-10-CM | POA: Diagnosis not present

## 2015-05-18 DIAGNOSIS — I509 Heart failure, unspecified: Secondary | ICD-10-CM | POA: Insufficient documentation

## 2015-05-18 DIAGNOSIS — R270 Ataxia, unspecified: Secondary | ICD-10-CM | POA: Diagnosis not present

## 2015-05-18 DIAGNOSIS — Z9861 Coronary angioplasty status: Secondary | ICD-10-CM | POA: Diagnosis not present

## 2015-05-18 DIAGNOSIS — R42 Dizziness and giddiness: Secondary | ICD-10-CM | POA: Diagnosis not present

## 2015-05-18 DIAGNOSIS — Z951 Presence of aortocoronary bypass graft: Secondary | ICD-10-CM | POA: Diagnosis not present

## 2015-05-18 DIAGNOSIS — Z7951 Long term (current) use of inhaled steroids: Secondary | ICD-10-CM | POA: Insufficient documentation

## 2015-05-18 DIAGNOSIS — Z9889 Other specified postprocedural states: Secondary | ICD-10-CM | POA: Insufficient documentation

## 2015-05-18 DIAGNOSIS — Z7901 Long term (current) use of anticoagulants: Secondary | ICD-10-CM | POA: Insufficient documentation

## 2015-05-18 DIAGNOSIS — Z8719 Personal history of other diseases of the digestive system: Secondary | ICD-10-CM | POA: Insufficient documentation

## 2015-05-18 DIAGNOSIS — Z79899 Other long term (current) drug therapy: Secondary | ICD-10-CM | POA: Diagnosis not present

## 2015-05-18 DIAGNOSIS — I4891 Unspecified atrial fibrillation: Secondary | ICD-10-CM | POA: Insufficient documentation

## 2015-05-18 DIAGNOSIS — E119 Type 2 diabetes mellitus without complications: Secondary | ICD-10-CM | POA: Diagnosis not present

## 2015-05-18 DIAGNOSIS — E785 Hyperlipidemia, unspecified: Secondary | ICD-10-CM | POA: Diagnosis not present

## 2015-05-18 DIAGNOSIS — I2510 Atherosclerotic heart disease of native coronary artery without angina pectoris: Secondary | ICD-10-CM | POA: Insufficient documentation

## 2015-05-18 DIAGNOSIS — I10 Essential (primary) hypertension: Secondary | ICD-10-CM | POA: Diagnosis not present

## 2015-05-18 HISTORY — DX: Presence of coronary angioplasty implant and graft: Z95.5

## 2015-05-18 LAB — CBC
HCT: 48.6 % (ref 39.0–52.0)
Hemoglobin: 15.3 g/dL (ref 13.0–17.0)
MCH: 27.8 pg (ref 26.0–34.0)
MCHC: 31.5 g/dL (ref 30.0–36.0)
MCV: 88.4 fL (ref 78.0–100.0)
Platelets: 129 10*3/uL — ABNORMAL LOW (ref 150–400)
RBC: 5.5 MIL/uL (ref 4.22–5.81)
RDW: 14.2 % (ref 11.5–15.5)
WBC: 5.8 10*3/uL (ref 4.0–10.5)

## 2015-05-18 LAB — BASIC METABOLIC PANEL
Anion gap: 8 (ref 5–15)
BUN: 24 mg/dL — ABNORMAL HIGH (ref 6–20)
CO2: 26 mmol/L (ref 22–32)
Calcium: 10.2 mg/dL (ref 8.9–10.3)
Chloride: 103 mmol/L (ref 101–111)
Creatinine, Ser: 1.51 mg/dL — ABNORMAL HIGH (ref 0.61–1.24)
GFR calc Af Amer: 51 mL/min — ABNORMAL LOW (ref >60)
GFR calc non Af Amer: 44 mL/min — ABNORMAL LOW (ref >60)
Glucose, Bld: 83 mg/dL (ref 65–99)
Potassium: 4.7 mmol/L (ref 3.5–5.1)
Sodium: 137 mmol/L (ref 135–145)

## 2015-05-18 LAB — URINALYSIS, ROUTINE W REFLEX MICROSCOPIC
Bilirubin Urine: NEGATIVE
Glucose, UA: NEGATIVE mg/dL
Hgb urine dipstick: NEGATIVE
Ketones, ur: NEGATIVE mg/dL
Leukocytes, UA: NEGATIVE
Nitrite: NEGATIVE
Protein, ur: NEGATIVE mg/dL
Specific Gravity, Urine: 1.02 (ref 1.005–1.030)
pH: 5 (ref 5.0–8.0)

## 2015-05-18 MED ORDER — DIAZEPAM 5 MG PO TABS
5.0000 mg | ORAL_TABLET | Freq: Once | ORAL | Status: AC
  Administered 2015-05-18: 5 mg via ORAL
  Filled 2015-05-18: qty 1

## 2015-05-18 MED ORDER — MECLIZINE HCL 25 MG PO TABS
25.0000 mg | ORAL_TABLET | Freq: Once | ORAL | Status: AC
  Administered 2015-05-18: 25 mg via ORAL
  Filled 2015-05-18: qty 1

## 2015-05-18 MED ORDER — MECLIZINE HCL 25 MG PO TABS: 25.0000 mg | ORAL_TABLET | Freq: Three times a day (TID) | ORAL | Status: DC | PRN

## 2015-05-18 NOTE — ED Provider Notes (Signed)
CSN: [id number]     Arrival date & time 05/18/15  1042 History   First MD Initiated Contact with Patient 05/18/15 1110     Chief Complaint  Patient presents with  . Dizziness      HPI Patient presents to the emergency department complaining of a sensation of dizziness.  At one point describes this more as a sense of the room spinning.  He also reports that he feels off balance.  Denies fevers or chills.  Reports no nausea vomiting or diarrhea.  Denies chest pain.  No prior history of stroke.   Past Medical History  Diagnosis Date  . Hyperlipidemia   . Hypertension   . CAD (coronary artery disease) 2012    [place]-LAD  . GERD (gastroesophageal reflux disease)   . S/P AVR     #25 mm Edwards pericardial valve Bioprosthetic. Dr Cyndia Bent.  . Atrial fibrillation ([place])   . HEART FAILURE, CONGESTIVE UNSPEC 02/23/2007    Qualifier: Diagnosis of  By: Mellody Drown MD, [facility]    . TOBACCO DEPENDENCE 07/29/2006    Qualifier: History of  By: Carlena Sax  MD, [facility] [place]    . COPD (chronic obstructive pulmonary disease) ([place])   . Type II diabetes mellitus ([place])   . Situational depression     "son passed 11/2013"  . OSA (obstructive sleep apnea) 11/13/2014  . Stented coronary artery 2013   Past Surgical History  Procedure Laterality Date  . Aortic valve replacement  07/2010    notes 07/28/2010  . Cardiac valve replacement    . Coronary artery bypass graft  07/2010    "1"/notes 07/28/2010  . Coronary angioplasty with stent placement  2006; 10/2006    "2"; 1/notes 10/01/2010  . Cardiac catheterization  06/2010    /notes 07/01/2010   Family History  Problem Relation Age of Onset  . Heart disease Mother    Social History  Substance Use Topics  . Smoking status: Former Smoker -- 0.50 packs/day for 46 years    Types: Cigarettes    Quit date: 07/08/2004  . Smokeless tobacco: Never Used  . Alcohol Use: No    Review of Systems  All other systems reviewed and are negative.     Allergies  Pineapple  concentrate  Home Medications   Prior to Admission medications   Medication Sig Start Date End Date Taking? Authorizing Provider  ACCU-CHEK AVIVA PLUS test strip USE TO CHECK BLOOD SUGARS FOUR TIMES DAILY 04/19/15  Yes Donnamae Jude, MD  aspirin-sod bicarb-citric acid (ALKA-SELTZER) 325 MG TBEF tablet Take 325 mg by mouth every 6 (six) hours as needed (congestion).   Yes Historical Provider, MD  atorvastatin (LIPITOR) 20 MG tablet Take 1 tablet (20 mg total) by mouth daily at 6 PM. 01/28/15  Yes Caren Macadam, MD  benazepril (LOTENSIN) 20 MG tablet Take 1 tablet (20 mg total) by mouth daily. 01/28/15  Yes Caren Macadam, MD  Blood Glucose Monitoring Suppl (ACCU-CHEK AVIVA PLUS) W/DEVICE KIT 1 Units by Does not apply route 3 (three) times daily. 12/10/14  Yes Donnamae Jude, MD  budesonide-formoterol (SYMBICORT) 80-4.5 MCG/ACT inhaler Inhale 2 puffs into the lungs 2 (two) times daily. 01/28/15  Yes Caren Macadam, MD  cholecalciferol (VITAMIN D) 1000 UNITS tablet Take 1 tablet (1,000 Units total) by mouth daily. 01/28/15  Yes Caren Macadam, MD  furosemide (LASIX) 40 MG tablet Take 1 tablet (40 mg total) by mouth 2 (two) times daily. 01/28/15  Yes Caren Macadam, MD  glipiZIDE (GLUCOTROL) 10 MG tablet Take 1 tablet (10 mg total) by mouth at bedtime. 01/28/15  Yes Caren Macadam, MD  nitroGLYCERIN (NITROSTAT) 0.4 MG SL tablet Place 1 tablet (0.4 mg total) under the tongue every 5 (five) minutes as needed. For chest pain 11/13/14  Yes Peter M Martinique, MD  potassium chloride SA (K-DUR,KLOR-CON) 20 MEQ tablet Take 1 tablet (20 mEq total) by mouth daily. 01/28/15  Yes Caren Macadam, MD  vitamin C (ASCORBIC ACID) 500 MG tablet Take 1 tablet (500 mg total) by mouth at bedtime. Patient taking differently: Take 500 mg by mouth every morning.  12/13/13  Yes Donnamae Jude, MD  warfarin (COUMADIN) 5 MG tablet Take 1 tablet (5 mg total) by mouth as directed. 01/28/15  Yes  Caren Macadam, MD  meclizine (ANTIVERT) 25 MG tablet Take 1 tablet (25 mg total) by mouth 3 (three) times daily as needed for dizziness. 05/18/15   Jola Schmidt, MD   BP 109/71 mmHg  Pulse 78  Temp(Src) 98 F (36.7 C) (Oral)  Resp 20  SpO2 94% Physical Exam  Constitutional: He is oriented to person, place, and time. He appears well-developed and well-nourished.  HENT:  Head: Normocephalic and atraumatic.  Eyes: EOM are normal.  Neck: Normal range of motion.  Cardiovascular: Normal rate, regular rhythm, normal heart sounds and intact distal pulses.   Pulmonary/Chest: Effort normal and breath sounds normal. No respiratory distress.  Abdominal: Soft. He exhibits no distension.  Musculoskeletal: Normal range of motion.  Neurological: He is alert and oriented to person, place, and time.  Skin: Skin is warm and dry.  Psychiatric: He has a normal mood and affect. Judgment normal.  Nursing note and vitals reviewed.   ED Course  Procedures (including critical care time) Labs Review Labs Reviewed  BASIC METABOLIC PANEL - Abnormal; Notable for the following:    BUN 24 (*)    Creatinine, Ser 1.51 (*)    GFR calc non Af Amer 44 (*)    GFR calc Af Amer 51 (*)    All other components within normal limits  CBC - Abnormal; Notable for the following:    Platelets 129 (*)    All other components within normal limits  URINALYSIS, ROUTINE W REFLEX MICROSCOPIC (NOT AT [facility])  CBG MONITORING, ED    Imaging Review Mr Brain Wo Contrast  05/18/2015  CLINICAL DATA:  New onset vertigo and ataxia this morning. Initial encounter. EXAM: MRI HEAD WITHOUT CONTRAST TECHNIQUE: Multiplanar, multiecho pulse sequences of the brain and surrounding structures were obtained without intravenous contrast. COMPARISON:  MR brain 12/16/2014 FINDINGS: The diffusion-weighted images demonstrate no evidence for acute or subacute infarction. The study is mildly degraded by patient motion. Periventricular and  subcortical white matter changes bilaterally are stable. Remote lacunar infarcts are present in the right basal ganglia and left thalamus. The ventricles are proportionate to the degree of atrophy. No significant extra-axial fluid collection is present. White matter changes extend into the brainstem. The internal auditory canals are within normal limits. Flow is present in the major intracranial arteries. Globes and orbits are intact. Chronic left maxillary sinus disease is present. The paranasal sinuses and mastoid air cells are otherwise clear. The skullbase is within limits. Midline sagittal images demonstrate degenerative changes within the upper cervical spine. No focal intracranial lesions are evident. IMPRESSION: 1. No acute intracranial abnormality or significant interval change. 2. Moderate atrophy and white matter disease. This likely reflects the sequela of chronic microvascular ischemia. Electronically  Signed   By: San Morelle M.D.   On: 05/18/2015 13:54   I have personally reviewed and evaluated these images and lab results as part of my medical decision-making.   EKG Interpretation None      MDM   Final diagnoses:  Vertigo    3:18 PM Patient feels much better this time.  MRI demonstrates no acute central process.  This is likely peripheral vertigo.  Discharge home in good condition.  Home with meclizine.  Primary care follow-up.  He understands to return to the ER for new or worsening symptoms    Jola Schmidt, MD 05/18/15 [phone]

## 2015-05-18 NOTE — Discharge Instructions (Signed)
Benign Positional Vertigo °Vertigo is the feeling that you or your surroundings are moving when they are not. Benign positional vertigo is the most common form of vertigo. The cause of this condition is not serious (is benign). This condition is triggered by certain movements and positions (is positional). This condition can be dangerous if it occurs while you are doing something that could endanger you or others, such as driving.  °CAUSES °In many cases, the cause of this condition is not known. It may be caused by a disturbance in an area of the inner ear that helps your brain to sense movement and balance. This disturbance can be caused by a viral infection (labyrinthitis), head injury, or repetitive motion. °RISK FACTORS °This condition is more likely to develop in: °· Women. °· People who are 50 years of age or older. °SYMPTOMS °Symptoms of this condition usually happen when you move your head or your eyes in different directions. Symptoms may start suddenly, and they usually last for less than a minute. Symptoms may include: °· Loss of balance and falling. °· Feeling like you are spinning or moving. °· Feeling like your surroundings are spinning or moving. °· Nausea and vomiting. °· Blurred vision. °· Dizziness. °· Involuntary eye movement (nystagmus). °Symptoms can be mild and cause only slight annoyance, or they can be severe and interfere with daily life. Episodes of benign positional vertigo may return (recur) over time, and they may be triggered by certain movements. Symptoms may improve over time. °DIAGNOSIS °This condition is usually diagnosed by medical history and a physical exam of the head, neck, and ears. You may be referred to a health care provider who specializes in ear, nose, and throat (ENT) problems (otolaryngologist) or a provider who specializes in disorders of the nervous system (neurologist). You may have additional testing, including: °· MRI. °· A CT scan. °· Eye movement tests. Your  health care provider may ask you to change positions quickly while he or she watches you for symptoms of benign positional vertigo, such as nystagmus. Eye movement may be tested with an electronystagmogram (ENG), caloric stimulation, the Dix-Hallpike test, or the roll test. °· An electroencephalogram (EEG). This records electrical activity in your brain. °· Hearing tests. °TREATMENT °Usually, your health care provider will treat this by moving your head in specific positions to adjust your inner ear back to normal. Surgery may be needed in severe cases, but this is rare. In some cases, benign positional vertigo may resolve on its own in 2-4 weeks. °HOME CARE INSTRUCTIONS °Safety °· Move slowly. Avoid sudden body or head movements. °· Avoid driving. °· Avoid operating heavy machinery. °· Avoid doing any tasks that would be dangerous to you or others if a vertigo episode would occur. °· If you have trouble walking or keeping your balance, try using a cane for stability. If you feel dizzy or unstable, sit down right away. °· Return to your normal activities as told by your health care provider. Ask your health care provider what activities are safe for you. °General Instructions °· Take over-the-counter and prescription medicines only as told by your health care provider. °· Avoid certain positions or movements as told by your health care provider. °· Drink enough fluid to keep your urine clear or pale yellow. °· Keep all follow-up visits as told by your health care provider. This is important. °SEEK MEDICAL CARE IF: °· You have a fever. °· Your condition gets worse or you develop new symptoms. °· Your family or friends   notice any behavioral changes.  Your nausea or vomiting gets worse.  You have numbness or a "pins and needles" sensation. SEEK IMMEDIATE MEDICAL CARE IF:  You have difficulty speaking or moving.  You are always dizzy.  You faint.  You develop severe headaches.  You have weakness in your  legs or arms.  You have changes in your hearing or vision.  You develop a stiff neck.  You develop sensitivity to light.   This information is not intended to replace advice given to you by your health care provider. Make sure you discuss any questions you have with your health care provider.   Document Released: 02/23/2006 Document Revised: 02/06/2015 Document Reviewed: 09/10/2014 Elsevier Interactive Patient Education 2016 Elsevier Inc.  Reviewed: 05/14/2014 Elsevier Interactive Patient Education [employer].

## 2015-05-18 NOTE — ED Notes (Signed)
Pt states he got up and ate breakfast and mid morning states his head was spinning and felt like he was going to fall but was able to sit down.  Denies Chest pain, SOB or HA.  Pt states his spinning has decreased but still feels the residual of it.

## 2015-05-18 NOTE — ED Notes (Signed)
Pt still at MRI

## 2015-06-12 DIAGNOSIS — G4733 Obstructive sleep apnea (adult) (pediatric): Secondary | ICD-10-CM | POA: Diagnosis not present

## 2015-06-14 ENCOUNTER — Telehealth: Payer: Self-pay | Admitting: Family Medicine

## 2015-06-14 NOTE — Telephone Encounter (Signed)
Will forward to MD to write letter for daughter.  Deette Revak,CMA

## 2015-06-14 NOTE — Telephone Encounter (Signed)
Pt daughter calling and states that her father needs a letter noting his dx of dementia and that he is incapable of self-support. Pt daughter asking provider to contact her for any questions; no more information was given. Please advise at the earliest convenience. Sadie Reynolds, ASA

## 2015-06-14 NOTE — Telephone Encounter (Signed)
Letter printed and placed up front. Jazmin Hartsell,CMA

## 2015-07-13 DIAGNOSIS — G4733 Obstructive sleep apnea (adult) (pediatric): Secondary | ICD-10-CM | POA: Diagnosis not present

## 2015-07-22 ENCOUNTER — Ambulatory Visit: Payer: Commercial Managed Care - HMO | Admitting: Cardiology

## 2015-07-24 ENCOUNTER — Encounter: Payer: Self-pay | Admitting: *Deleted

## 2015-07-25 ENCOUNTER — Encounter: Payer: Self-pay | Admitting: Family Medicine

## 2015-07-25 ENCOUNTER — Ambulatory Visit (INDEPENDENT_AMBULATORY_CARE_PROVIDER_SITE_OTHER): Payer: Commercial Managed Care - HMO | Admitting: Family Medicine

## 2015-07-25 VITALS — BP 136/70 | HR 74 | Temp 98.4°F | Wt 253.0 lb

## 2015-07-25 DIAGNOSIS — I482 Chronic atrial fibrillation, unspecified: Secondary | ICD-10-CM

## 2015-07-25 DIAGNOSIS — R2681 Unsteadiness on feet: Secondary | ICD-10-CM

## 2015-07-25 DIAGNOSIS — E099 Drug or chemical induced diabetes mellitus without complications: Secondary | ICD-10-CM | POA: Diagnosis not present

## 2015-07-25 DIAGNOSIS — E119 Type 2 diabetes mellitus without complications: Secondary | ICD-10-CM

## 2015-07-25 DIAGNOSIS — E118 Type 2 diabetes mellitus with unspecified complications: Secondary | ICD-10-CM | POA: Diagnosis not present

## 2015-07-25 DIAGNOSIS — E785 Hyperlipidemia, unspecified: Secondary | ICD-10-CM | POA: Diagnosis not present

## 2015-07-25 DIAGNOSIS — G4733 Obstructive sleep apnea (adult) (pediatric): Secondary | ICD-10-CM

## 2015-07-25 DIAGNOSIS — Z794 Long term (current) use of insulin: Secondary | ICD-10-CM

## 2015-07-25 DIAGNOSIS — E0843 Diabetes mellitus due to underlying condition with diabetic autonomic (poly)neuropathy: Secondary | ICD-10-CM

## 2015-07-25 DIAGNOSIS — K5901 Slow transit constipation: Secondary | ICD-10-CM

## 2015-07-25 DIAGNOSIS — I10 Essential (primary) hypertension: Secondary | ICD-10-CM

## 2015-07-25 DIAGNOSIS — E559 Vitamin D deficiency, unspecified: Secondary | ICD-10-CM

## 2015-07-25 LAB — LIPID PANEL
Cholesterol: 145 mg/dL (ref 125–200)
HDL: 39 mg/dL — ABNORMAL LOW (ref >=40)
LDL Cholesterol: 70 mg/dL (ref <130)
Total CHOL/HDL Ratio: 3.7 Ratio (ref <=5.0)
Triglycerides: 181 mg/dL — ABNORMAL HIGH (ref <150)
VLDL: 36 mg/dL — ABNORMAL HIGH (ref <30)

## 2015-07-25 LAB — POCT INR: INR: 2.1

## 2015-07-25 LAB — POCT GLYCOSYLATED HEMOGLOBIN (HGB A1C): Hemoglobin A1C: 7.5

## 2015-07-25 MED ORDER — WARFARIN SODIUM 5 MG PO TABS: 5.0000 mg | ORAL_TABLET | ORAL | Status: DC

## 2015-07-25 MED ORDER — CALCIUM POLYCARBOPHIL 625 MG PO TABS: 625.0000 mg | ORAL_TABLET | Freq: Every day | ORAL | Status: DC

## 2015-07-25 MED ORDER — VITAMIN C 500 MG PO TABS: 500.0000 mg | ORAL_TABLET | Freq: Every morning | ORAL | Status: DC

## 2015-07-25 MED ORDER — POTASSIUM CHLORIDE CRYS ER 20 MEQ PO TBCR: 20.0000 meq | EXTENDED_RELEASE_TABLET | Freq: Every day | ORAL | Status: DC

## 2015-07-25 MED ORDER — NITROGLYCERIN 0.4 MG SL SUBL: 0.4000 mg | SUBLINGUAL_TABLET | SUBLINGUAL | Status: DC | PRN

## 2015-07-25 MED ORDER — GLUCOSE BLOOD VI STRP
ORAL_STRIP | Status: DC
Start: 2015-07-25 — End: 2017-02-04

## 2015-07-25 MED ORDER — FUROSEMIDE 40 MG PO TABS
40.0000 mg | ORAL_TABLET | Freq: Two times a day (BID) | ORAL | Status: DC
Start: 2015-07-25 — End: 2015-11-28

## 2015-07-25 MED ORDER — BENAZEPRIL HCL 20 MG PO TABS: 20.0000 mg | ORAL_TABLET | Freq: Every day | ORAL | Status: DC

## 2015-07-25 MED ORDER — VITAMIN D3 25 MCG (1000 UNIT) PO TABS: 1000.0000 [IU] | ORAL_TABLET | Freq: Every day | ORAL | Status: DC

## 2015-07-25 MED ORDER — GLIPIZIDE 10 MG PO TABS: 10.0000 mg | ORAL_TABLET | Freq: Every day | ORAL | Status: DC

## 2015-07-25 NOTE — Progress Notes (Signed)
Subjective:    Patient ID: Matthew Edwards is a 75 y.o. male presenting with Constipation and Diabetes  on 07/25/2015  HPI: Here today for med refill.  Daughter brings him in and states he is not steady with his gait and needs a PT eval. Uses a cane usually. Also, notes constipation. Needs an INR check. Gets sore throat due to sleep apnea equipment.  Review of Systems  Constitutional: Negative for fever and chills.  Respiratory: Negative for shortness of breath.   Cardiovascular: Negative for leg swelling.  Gastrointestinal: Positive for constipation. Negative for nausea, vomiting, abdominal pain and blood in stool.      Objective:    BP 136/70 mmHg  Pulse 74  Temp(Src) 98.4 F (36.9 C) (Oral)  Wt 253 lb (114.76 kg) Physical Exam  Constitutional: He appears well-developed and well-nourished. No distress.  HENT:  Head: Normocephalic and atraumatic.  Eyes: No scleral icterus.  Neck: Neck supple.  Cardiovascular: Normal rate.   Pulmonary/Chest: Effort normal.  Abdominal: Soft.  Musculoskeletal: He exhibits no edema.  Neurological: He is alert.  Skin: Skin is warm.  Psychiatric: He has a normal mood and affect.  Vitals reviewed. I NR-2.1 HgbA1C 7.5     Assessment & Plan:   Problem List Items Addressed This Visit      Unprioritized   HLD (hyperlipidemia)    Check lipid panel      Relevant Medications   benazepril (LOTENSIN) 20 MG tablet   warfarin (COUMADIN) 5 MG tablet   furosemide (LASIX) 40 MG tablet   nitroGLYCERIN (NITROSTAT) 0.4 MG SL tablet   Other Relevant Orders   Lipid panel   Essential hypertension    BP at goal      Relevant Medications   benazepril (LOTENSIN) 20 MG tablet   warfarin (COUMADIN) 5 MG tablet   furosemide (LASIX) 40 MG tablet   nitroGLYCERIN (NITROSTAT) 0.4 MG SL tablet   potassium chloride SA (K-DUR,KLOR-CON) 20 MEQ tablet   Diabetes mellitus (HCC) - Primary   Relevant Medications   benazepril (LOTENSIN) 20 MG tablet     glipiZIDE (GLUCOTROL) 10 MG tablet   Other Relevant Orders   POCT A1C (Completed)   POCT INR (Completed)   Microalbumin/Creatinine Ratio, Urine   Diabetic neuropathy (HCC)    To get diabetic shoes      Relevant Medications   benazepril (LOTENSIN) 20 MG tablet   glipiZIDE (GLUCOTROL) 10 MG tablet   Other Relevant Orders   Ambulatory referral to Podiatry   Chronic atrial fibrillation (HCC)    On coumadin - check INR      Relevant Medications   benazepril (LOTENSIN) 20 MG tablet   warfarin (COUMADIN) 5 MG tablet   furosemide (LASIX) 40 MG tablet   nitroGLYCERIN (NITROSTAT) 0.4 MG SL tablet   Diabetes mellitus type 2 with complications (HCC)    [id number] is < 8      Relevant Medications   benazepril (LOTENSIN) 20 MG tablet   glipiZIDE (GLUCOTROL) 10 MG tablet   Other Relevant Orders   Microalbumin/Creatinine Ratio, Urine   OSA (obstructive sleep apnea)    Has f/u planned in March      Constipation    Add Fibercon      Relevant Medications   polycarbophil (FIBERCON) 625 MG tablet    Other Visit Diagnoses    Vitamin D deficiency        Relevant Medications    cholecalciferol (VITAMIN D) 1000 units tablet    vitamin  C (ASCORBIC ACID) 500 MG tablet    Controlled type 2 diabetes mellitus without complication, with long-term current use of insulin (HCC)        Relevant Medications    benazepril (LOTENSIN) 20 MG tablet    glipiZIDE (GLUCOTROL) 10 MG tablet    Unsteady gait        Relevant Orders    Ambulatory referral to Physical Therapy       Total face-to-face time with patient: 25 minutes. Over 50% of encounter was spent on counseling and coordination of care. Return in about 6 months (around 01/22/2016) for a follow-up.  PRATT,TANYA S 07/25/2015 5:05 PM

## 2015-07-25 NOTE — Assessment & Plan Note (Signed)
To get diabetic shoes 

## 2015-07-25 NOTE — Assessment & Plan Note (Signed)
On coumadin - check INR

## 2015-07-25 NOTE — Assessment & Plan Note (Signed)
A1C is < 8

## 2015-07-25 NOTE — Assessment & Plan Note (Signed)
Add Fibercon

## 2015-07-25 NOTE — Patient Instructions (Signed)
Blood Glucose Monitoring, Adult Monitoring your blood glucose (also know as blood sugar) helps you to manage your diabetes. It also helps you and your health care provider monitor your diabetes and determine how well your treatment plan is working. WHY SHOULD YOU MONITOR YOUR BLOOD GLUCOSE?  It can help you understand how food, exercise, and medicine affect your blood glucose.  It allows you to know what your blood glucose is at any given moment. You can quickly tell if you are having low blood glucose (hypoglycemia) or high blood glucose (hyperglycemia).  It can help you and your health care provider know how to adjust your medicines.  It can help you understand how to manage an illness or adjust medicine for exercise. WHEN SHOULD YOU TEST? Your health care provider will help you decide how often you should check your blood glucose. This may depend on the type of diabetes you have, your diabetes control, or the types of medicines you are taking. Be sure to write down all of your blood glucose readings so that this information can be reviewed with your health care provider. See below for examples of testing times that your health care provider may suggest. Type 1 Diabetes  Test at least 2 times per day if your diabetes is well controlled, if you are using an insulin pump, or if you perform multiple daily injections.  If your diabetes is not well controlled or if you are sick, you may need to test more often.  It is a good idea to also test:  Before every insulin injection.  Before and after exercise.  Between meals and 2 hours after a meal.  Occasionally between 2:00 a.m. and 3:00 a.m. Type 2 Diabetes  If you are taking insulin, test at least 2 times per day. However, it is best to test before every insulin injection.  If you take medicines by mouth (orally), test 2 times a day.  If you are on a controlled diet, test once a day.  If your diabetes is not well controlled or if you  are sick, you may need to monitor more often. HOW TO MONITOR YOUR BLOOD GLUCOSE Supplies Needed  Blood glucose meter.  Test strips for your meter. Each meter has its own strips. You must use the strips that go with your own meter.  A pricking needle (lancet).  A device that holds the lancet (lancing device).  A journal or log book to write down your results. Procedure  Wash your hands with soap and water. Alcohol is not preferred.  Prick the side of your finger (not the tip) with the lancet.  Gently milk the finger until a small drop of blood appears.  Follow the instructions that come with your meter for inserting the test strip, applying blood to the strip, and using your blood glucose meter. Other Areas to Get Blood for Testing Some meters allow you to use other areas of your body (other than your finger) to test your blood. These areas are called alternative sites. The most common alternative sites are:  The forearm.  The thigh.  The back area of the lower leg.  The palm of the hand. The blood flow in these areas is slower. Therefore, the blood glucose values you get may be delayed, and the numbers are different from what you would get from your fingers. Do not use alternative sites if you think you are having hypoglycemia. Your reading will not be accurate. Always use a finger if you are  having hypoglycemia. Also, if you cannot feel your lows (hypoglycemia unawareness), always use your fingers for your blood glucose checks. ADDITIONAL TIPS FOR GLUCOSE MONITORING  Do not reuse lancets.  Always carry your supplies with you.  All blood glucose meters have a 24-hour "hotline" number to call if you have questions or need help.  Adjust (calibrate) your blood glucose meter with a control solution after finishing a few boxes of strips. BLOOD GLUCOSE RECORD KEEPING It is a good idea to keep a daily record or log of your blood glucose readings. Most glucose meters, if not all,  keep your glucose records stored in the meter. Some meters come with the ability to download your records to your home computer. Keeping a record of your blood glucose readings is especially helpful if you are wanting to look for patterns. Make notes to go along with the blood glucose readings because you might forget what happened at that exact time. Keeping good records helps you and your health care provider to work together to achieve good diabetes management.    This information is not intended to replace advice given to you by your health care provider. Make sure you discuss any questions you have with your health care provider.   Document Released: 05/21/2003 Document Revised: 06/08/2014 Document Reviewed: 10/10/2012 Elsevier Interactive Patient Education 2016 [place]  Walking is a great form of exercise to increase your strength, endurance and overall fitness.  A walking program can help you start slowly and gradually build endurance as you go.  Everyone's ability is different, so each person's starting point will be different.  You do not have to follow them exactly.  The are just samples. You should simply find out what's right for you and stick to that program.   In the beginning, you'll start off walking 2-3 times a day for short distances.  As you get stronger, you'll be walking further at just 1-2 times per day.  A. You Can Walk For A Certain Length Of Time Each Day    Walk 5 minutes 3 times per day.  Increase 2 minutes every 2 days (3 times per day).  Work up to 25-30 minutes (1-2 times per day).   Example:   Day 1-2 5 minutes 3 times per day   Day 7-8 12 minutes 2-3 times per day   Day 13-14 25 minutes 1-2 times per day  B. You Can Walk For a Certain Distance Each Day     Distance can be substituted for time.    Example:   3 trips to mailbox (at road)   3 trips to corner of block   3 trips around the block  C. Go to local [employer] and use the  track.

## 2015-07-25 NOTE — Assessment & Plan Note (Signed)
BP at goal 

## 2015-07-25 NOTE — Assessment & Plan Note (Signed)
Has f/u planned in March

## 2015-07-25 NOTE — Assessment & Plan Note (Signed)
Check lipid panel  

## 2015-07-26 ENCOUNTER — Telehealth: Payer: Self-pay | Admitting: *Deleted

## 2015-07-26 DIAGNOSIS — Z794 Long term (current) use of insulin: Secondary | ICD-10-CM

## 2015-07-26 DIAGNOSIS — E119 Type 2 diabetes mellitus without complications: Secondary | ICD-10-CM

## 2015-07-26 DIAGNOSIS — E0843 Diabetes mellitus due to underlying condition with diabetic autonomic (poly)neuropathy: Secondary | ICD-10-CM

## 2015-07-26 LAB — MICROALBUMIN / CREATININE URINE RATIO
Creatinine, Urine: 70 mg/dL (ref 20–370)
Microalb Creat Ratio: 3 mcg/mg creat (ref <30)
Microalb, Ur: 0.2 mg/dL

## 2015-07-26 NOTE — Telephone Encounter (Signed)
Received a call from [facility] needing clarification on glucotrol 10 mg directions.  The directions are Take 1 tablet (10 mg total) by mouth at bedtime.  Should this medication be taking at bedtime and not with a meal please call [phone].  Derl Barrow, RN

## 2015-07-29 ENCOUNTER — Encounter: Payer: Self-pay | Admitting: *Deleted

## 2015-07-29 MED ORDER — GLIPIZIDE 10 MG PO TABS: ORAL_TABLET | ORAL | Status: DC

## 2015-07-29 NOTE — Telephone Encounter (Signed)
Call patient to confirm the directions. Patient stated he is taking medication everyday at 5 PM with a meal.  Left voice message on [facility] voicemail informing them of the correct directions.  Derl Barrow, RN

## 2015-07-30 NOTE — Telephone Encounter (Signed)
Error.  Derl Barrow, RN  This encounter was created in error - please disregard.

## 2015-08-08 ENCOUNTER — Ambulatory Visit (INDEPENDENT_AMBULATORY_CARE_PROVIDER_SITE_OTHER): Payer: Commercial Managed Care - HMO | Admitting: *Deleted

## 2015-08-08 DIAGNOSIS — I4891 Unspecified atrial fibrillation: Secondary | ICD-10-CM

## 2015-08-08 DIAGNOSIS — I10 Essential (primary) hypertension: Secondary | ICD-10-CM

## 2015-08-08 DIAGNOSIS — I2510 Atherosclerotic heart disease of native coronary artery without angina pectoris: Secondary | ICD-10-CM | POA: Diagnosis not present

## 2015-08-08 DIAGNOSIS — I359 Nonrheumatic aortic valve disorder, unspecified: Secondary | ICD-10-CM

## 2015-08-08 DIAGNOSIS — E785 Hyperlipidemia, unspecified: Secondary | ICD-10-CM | POA: Diagnosis not present

## 2015-08-08 LAB — POCT INR: INR: 3.7

## 2015-08-10 DIAGNOSIS — G4733 Obstructive sleep apnea (adult) (pediatric): Secondary | ICD-10-CM | POA: Diagnosis not present

## 2015-08-19 ENCOUNTER — Telehealth: Payer: Self-pay | Admitting: Cardiology

## 2015-08-19 NOTE — Telephone Encounter (Signed)
Follow UP   Pt daughter called to follow up// returned call

## 2015-09-10 DIAGNOSIS — G4733 Obstructive sleep apnea (adult) (pediatric): Secondary | ICD-10-CM | POA: Diagnosis not present

## 2015-09-18 ENCOUNTER — Telehealth: Payer: Self-pay | Admitting: *Deleted

## 2015-09-18 ENCOUNTER — Emergency Department (HOSPITAL_COMMUNITY): Payer: Commercial Managed Care - HMO

## 2015-09-18 ENCOUNTER — Encounter (HOSPITAL_COMMUNITY): Payer: Self-pay | Admitting: Emergency Medicine

## 2015-09-18 ENCOUNTER — Ambulatory Visit: Payer: Commercial Managed Care - HMO | Admitting: Family Medicine

## 2015-09-18 ENCOUNTER — Observation Stay (HOSPITAL_COMMUNITY)
Admission: EM | Admit: 2015-09-18 | Discharge: 2015-09-20 | Disposition: A | Discharge status: Home / Self Care | Payer: Commercial Managed Care - HMO | Attending: Family Medicine | Admitting: Family Medicine

## 2015-09-18 DIAGNOSIS — R791 Abnormal coagulation profile: Secondary | ICD-10-CM | POA: Insufficient documentation

## 2015-09-18 DIAGNOSIS — Z043 Encounter for examination and observation following other accident: Secondary | ICD-10-CM | POA: Insufficient documentation

## 2015-09-18 DIAGNOSIS — Y9389 Activity, other specified: Secondary | ICD-10-CM | POA: Insufficient documentation

## 2015-09-18 DIAGNOSIS — I509 Heart failure, unspecified: Secondary | ICD-10-CM | POA: Insufficient documentation

## 2015-09-18 DIAGNOSIS — K219 Gastro-esophageal reflux disease without esophagitis: Secondary | ICD-10-CM | POA: Insufficient documentation

## 2015-09-18 DIAGNOSIS — R296 Repeated falls: Secondary | ICD-10-CM

## 2015-09-18 DIAGNOSIS — E118 Type 2 diabetes mellitus with unspecified complications: Secondary | ICD-10-CM | POA: Insufficient documentation

## 2015-09-18 DIAGNOSIS — E785 Hyperlipidemia, unspecified: Secondary | ICD-10-CM | POA: Diagnosis not present

## 2015-09-18 DIAGNOSIS — I2510 Atherosclerotic heart disease of native coronary artery without angina pectoris: Secondary | ICD-10-CM | POA: Insufficient documentation

## 2015-09-18 DIAGNOSIS — I10 Essential (primary) hypertension: Secondary | ICD-10-CM

## 2015-09-18 DIAGNOSIS — R4789 Other speech disturbances: Secondary | ICD-10-CM | POA: Diagnosis not present

## 2015-09-18 DIAGNOSIS — R269 Unspecified abnormalities of gait and mobility: Secondary | ICD-10-CM | POA: Insufficient documentation

## 2015-09-18 DIAGNOSIS — Z79899 Other long term (current) drug therapy: Secondary | ICD-10-CM | POA: Insufficient documentation

## 2015-09-18 DIAGNOSIS — R51 Headache: Secondary | ICD-10-CM | POA: Insufficient documentation

## 2015-09-18 DIAGNOSIS — W19XXXA Unspecified fall, initial encounter: Secondary | ICD-10-CM | POA: Diagnosis not present

## 2015-09-18 DIAGNOSIS — N182 Chronic kidney disease, stage 2 (mild): Secondary | ICD-10-CM

## 2015-09-18 DIAGNOSIS — R531 Weakness: Secondary | ICD-10-CM | POA: Diagnosis not present

## 2015-09-18 DIAGNOSIS — I4891 Unspecified atrial fibrillation: Secondary | ICD-10-CM | POA: Insufficient documentation

## 2015-09-18 DIAGNOSIS — J449 Chronic obstructive pulmonary disease, unspecified: Secondary | ICD-10-CM | POA: Insufficient documentation

## 2015-09-18 DIAGNOSIS — F4321 Adjustment disorder with depressed mood: Secondary | ICD-10-CM | POA: Insufficient documentation

## 2015-09-18 DIAGNOSIS — Z87891 Personal history of nicotine dependence: Secondary | ICD-10-CM | POA: Insufficient documentation

## 2015-09-18 DIAGNOSIS — R270 Ataxia, unspecified: Secondary | ICD-10-CM

## 2015-09-18 DIAGNOSIS — R29898 Other symptoms and signs involving the musculoskeletal system: Secondary | ICD-10-CM | POA: Insufficient documentation

## 2015-09-18 DIAGNOSIS — I480 Paroxysmal atrial fibrillation: Secondary | ICD-10-CM

## 2015-09-18 DIAGNOSIS — Y998 Other external cause status: Secondary | ICD-10-CM | POA: Insufficient documentation

## 2015-09-18 DIAGNOSIS — E119 Type 2 diabetes mellitus without complications: Secondary | ICD-10-CM | POA: Insufficient documentation

## 2015-09-18 DIAGNOSIS — G4733 Obstructive sleep apnea (adult) (pediatric): Secondary | ICD-10-CM | POA: Insufficient documentation

## 2015-09-18 DIAGNOSIS — W1839XA Other fall on same level, initial encounter: Secondary | ICD-10-CM | POA: Insufficient documentation

## 2015-09-18 DIAGNOSIS — Y9289 Other specified places as the place of occurrence of the external cause: Secondary | ICD-10-CM | POA: Insufficient documentation

## 2015-09-18 DIAGNOSIS — R159 Full incontinence of feces: Secondary | ICD-10-CM

## 2015-09-18 DIAGNOSIS — M6281 Muscle weakness (generalized): Secondary | ICD-10-CM | POA: Diagnosis not present

## 2015-09-18 DIAGNOSIS — R410 Disorientation, unspecified: Secondary | ICD-10-CM

## 2015-09-18 DIAGNOSIS — Z7901 Long term (current) use of anticoagulants: Secondary | ICD-10-CM | POA: Insufficient documentation

## 2015-09-18 DIAGNOSIS — I951 Orthostatic hypotension: Secondary | ICD-10-CM

## 2015-09-18 LAB — I-STAT TROPONIN, ED: Troponin i, poc: 0 ng/mL (ref 0.00–0.08)

## 2015-09-18 LAB — RAPID URINE DRUG SCREEN, HOSP PERFORMED
Amphetamines: NOT DETECTED
Barbiturates: NOT DETECTED
Benzodiazepines: NOT DETECTED
Cocaine: NOT DETECTED
Opiates: NOT DETECTED
Tetrahydrocannabinol: NOT DETECTED

## 2015-09-18 LAB — URINALYSIS, ROUTINE W REFLEX MICROSCOPIC
Bilirubin Urine: NEGATIVE
Glucose, UA: 100 mg/dL — AB
Hgb urine dipstick: NEGATIVE
Ketones, ur: 15 mg/dL — AB
Leukocytes, UA: NEGATIVE
Nitrite: NEGATIVE
Protein, ur: NEGATIVE mg/dL
Specific Gravity, Urine: 1.02 (ref 1.005–1.030)
pH: 5.5 (ref 5.0–8.0)

## 2015-09-18 LAB — CBC
HCT: 46.4 % (ref 39.0–52.0)
Hemoglobin: 15.1 g/dL (ref 13.0–17.0)
MCH: 29.5 pg (ref 26.0–34.0)
MCHC: 32.5 g/dL (ref 30.0–36.0)
MCV: 90.8 fL (ref 78.0–100.0)
Platelets: 132 10*3/uL — ABNORMAL LOW (ref 150–400)
RBC: 5.11 MIL/uL (ref 4.22–5.81)
RDW: 13.1 % (ref 11.5–15.5)
WBC: 7.6 10*3/uL (ref 4.0–10.5)

## 2015-09-18 LAB — BASIC METABOLIC PANEL
Anion gap: 11 (ref 5–15)
BUN: 13 mg/dL (ref 6–20)
CO2: 25 mmol/L (ref 22–32)
Calcium: 9.9 mg/dL (ref 8.9–10.3)
Chloride: 101 mmol/L (ref 101–111)
Creatinine, Ser: 1.26 mg/dL — ABNORMAL HIGH (ref 0.61–1.24)
GFR calc Af Amer: >60 mL/min (ref >60)
GFR calc non Af Amer: 55 mL/min — ABNORMAL LOW (ref >60)
Glucose, Bld: 180 mg/dL — ABNORMAL HIGH (ref 65–99)
Potassium: 4.3 mmol/L (ref 3.5–5.1)
Sodium: 137 mmol/L (ref 135–145)

## 2015-09-18 LAB — GLUCOSE, CAPILLARY: Glucose-Capillary: 132 mg/dL — ABNORMAL HIGH (ref 65–99)

## 2015-09-18 LAB — PROTIME-INR
INR: 1.37 (ref 0.00–1.49)
Prothrombin Time: 17 seconds — ABNORMAL HIGH (ref 11.6–15.2)

## 2015-09-18 LAB — ETHANOL: Alcohol, Ethyl (B): <5 mg/dL (ref <5)

## 2015-09-18 MED ORDER — ALBUTEROL SULFATE HFA 108 (90 BASE) MCG/ACT IN AERS: 2.0000 | INHALATION_SPRAY | RESPIRATORY_TRACT | Status: DC | PRN

## 2015-09-18 MED ORDER — ALBUTEROL SULFATE (2.5 MG/3ML) 0.083% IN NEBU: 2.5000 mg | INHALATION_SOLUTION | Freq: Four times a day (QID) | RESPIRATORY_TRACT | Status: DC | PRN

## 2015-09-18 MED ORDER — MOMETASONE FURO-FORMOTEROL FUM 100-5 MCG/ACT IN AERO
2.0000 | INHALATION_SPRAY | Freq: Two times a day (BID) | RESPIRATORY_TRACT | Status: DC
  Filled 2015-09-18 (×2): qty 8.8

## 2015-09-18 MED ORDER — CALCIUM POLYCARBOPHIL 625 MG PO TABS
625.0000 mg | ORAL_TABLET | Freq: Every day | ORAL | Status: DC
  Administered 2015-09-18 – 2015-09-20 (×3): 625 mg via ORAL
  Filled 2015-09-18 (×3): qty 1

## 2015-09-18 MED ORDER — FUROSEMIDE 40 MG PO TABS
40.0000 mg | ORAL_TABLET | Freq: Two times a day (BID) | ORAL | Status: DC
  Administered 2015-09-19 – 2015-09-20 (×4): 40 mg via ORAL
  Filled 2015-09-18 (×4): qty 1

## 2015-09-18 MED ORDER — GLIPIZIDE 10 MG PO TABS
10.0000 mg | ORAL_TABLET | Freq: Every day | ORAL | Status: DC
  Administered 2015-09-19 – 2015-09-20 (×2): 10 mg via ORAL
  Filled 2015-09-18 (×2): qty 1

## 2015-09-18 MED ORDER — STROKE: EARLY STAGES OF RECOVERY BOOK
Freq: Once | Status: DC
  Filled 2015-09-18: qty 1

## 2015-09-18 MED ORDER — ATORVASTATIN CALCIUM 20 MG PO TABS
20.0000 mg | ORAL_TABLET | Freq: Every day | ORAL | Status: DC
  Administered 2015-09-18: 20 mg via ORAL
  Filled 2015-09-18: qty 1

## 2015-09-18 MED ORDER — VITAMIN D3 25 MCG (1000 UNIT) PO TABS
1000.0000 [IU] | ORAL_TABLET | Freq: Every day | ORAL | Status: DC
  Administered 2015-09-18 – 2015-09-20 (×3): 1000 [IU] via ORAL
  Filled 2015-09-18 (×6): qty 1

## 2015-09-18 MED ORDER — WARFARIN - PHARMACIST DOSING INPATIENT
Freq: Every day | Status: DC
  Administered 2015-09-18: 19:00:00

## 2015-09-18 MED ORDER — POTASSIUM CHLORIDE CRYS ER 20 MEQ PO TBCR
20.0000 meq | EXTENDED_RELEASE_TABLET | Freq: Every day | ORAL | Status: DC
  Administered 2015-09-18 – 2015-09-20 (×3): 20 meq via ORAL
  Filled 2015-09-18 (×3): qty 1

## 2015-09-18 MED ORDER — PREDNISONE 20 MG PO TABS
50.0000 mg | ORAL_TABLET | Freq: Every day | ORAL | Status: DC
  Administered 2015-09-18 – 2015-09-20 (×2): 50 mg via ORAL
  Filled 2015-09-18 (×2): qty 2

## 2015-09-18 MED ORDER — WARFARIN SODIUM 10 MG PO TABS
10.0000 mg | ORAL_TABLET | Freq: Once | ORAL | Status: AC
  Administered 2015-09-18: 10 mg via ORAL
  Filled 2015-09-18: qty 1

## 2015-09-18 NOTE — ED Notes (Signed)
Neurology at the bedside

## 2015-09-18 NOTE — ED Notes (Signed)
Patient transported to CT 

## 2015-09-18 NOTE — ED Provider Notes (Signed)
CSN: [id number]     Arrival date & time 09/18/15  1134 History   First MD Initiated Contact with Patient 09/18/15 1219     Chief Complaint  Patient presents with  . Fall    Patient is a 75 y.o. male presenting with fall. The history is provided by the patient.  Fall This is a new problem. The current episode started 6 to 12 hours ago. The problem has been gradually improving. Pertinent negatives include no chest pain, no abdominal pain, no headaches and no shortness of breath. The symptoms are aggravated by walking. The symptoms are relieved by rest.  Patient with h/o atrial fibrillation, h/o HLP/HTN, s/p AVR and is on coumadin presents after increasing falls Daughter called him today and when he did not answer she went to his house and he was on floor with blood on his face. He reports increasing falls over past 3 weeks He reports generalized fatigue and also reports increased left LE weakness for weeks No HA No new neck/back pain No cp/sob Daughter reports he has appeared more confused and weeks over past several weeks No new meds   Past Medical History  Diagnosis Date  . Hyperlipidemia   . Hypertension   . CAD (coronary artery disease) 2012    [place]-LAD  . GERD (gastroesophageal reflux disease)   . S/P AVR     #25 mm Edwards pericardial valve Bioprosthetic. Dr Cyndia Bent.  . Atrial fibrillation ([place])   . HEART FAILURE, CONGESTIVE UNSPEC 02/23/2007    Qualifier: Diagnosis of  By: Mellody Drown MD, [facility]    . TOBACCO DEPENDENCE 07/29/2006    Qualifier: History of  By: Carlena Sax  MD, Colletta [place]    . COPD (chronic obstructive pulmonary disease) ([place])   . Type II diabetes mellitus ([place])   . Situational depression     "son passed 11/2013"  . OSA (obstructive sleep apnea) 11/13/2014  . Stented coronary artery 2013   Past Surgical History  Procedure Laterality Date  . Aortic valve replacement  07/2010    notes 07/28/2010  . Cardiac valve replacement    . Coronary artery bypass graft  07/2010   "1"/notes 07/28/2010  . Coronary angioplasty with stent placement  2006; 10/2006    "2"; 1/notes 10/01/2010  . Cardiac catheterization  06/2010    /notes 07/01/2010   Family History  Problem Relation Age of Onset  . Heart disease Mother    Social History  Substance Use Topics  . Smoking status: Former Smoker -- 0.50 packs/day for 46 years    Types: Cigarettes    Quit date: 07/08/2004  . Smokeless tobacco: Never Used  . Alcohol Use: No    Review of Systems  Constitutional: Positive for fatigue. Negative for fever.  Respiratory: Negative for shortness of breath.   Cardiovascular: Negative for chest pain.  Gastrointestinal: Negative for abdominal pain.  Neurological: Positive for weakness. Negative for headaches.  All other systems reviewed and are negative.     Allergies  Pineapple concentrate  Home Medications   Prior to Admission medications   Medication Sig Start Date End Date Taking? Authorizing Provider  atorvastatin (LIPITOR) 20 MG tablet Take 1 tablet (20 mg total) by mouth daily at 6 PM. 01/28/15  Yes Caren Macadam, MD  benazepril (LOTENSIN) 20 MG tablet Take 1 tablet (20 mg total) by mouth daily. 07/25/15  Yes Donnamae Jude, MD  Blood Glucose Monitoring Suppl (ACCU-CHEK AVIVA PLUS) W/DEVICE KIT 1 Units by Does not apply route 3 (three) times  daily. 12/10/14  Yes Donnamae Jude, MD  budesonide-formoterol [employer]) 80-4.5 MCG/ACT inhaler Inhale 2 puffs into the lungs 2 (two) times daily. 01/28/15  Yes Caren Macadam, MD  cholecalciferol (VITAMIN D) 1000 units tablet Take 1 tablet (1,000 Units total) by mouth daily. 07/25/15  Yes Donnamae Jude, MD  furosemide (LASIX) 40 MG tablet Take 1 tablet (40 mg total) by mouth 2 (two) times daily. 07/25/15  Yes Donnamae Jude, MD  glipiZIDE (GLUCOTROL) 10 MG tablet Take medication at 5 PM with a meal. 07/29/15  Yes Donnamae Jude, MD  glucose blood (ACCU-CHEK AVIVA PLUS) test strip USE TO CHECK BLOOD SUGARS FOUR TIMES DAILY  07/25/15  Yes Donnamae Jude, MD  polycarbophil (FIBERCON) 625 MG tablet Take 1 tablet (625 mg total) by mouth daily. 07/25/15  Yes Donnamae Jude, MD  potassium chloride SA (K-DUR,KLOR-CON) 20 MEQ tablet Take 1 tablet (20 mEq total) by mouth daily. 07/25/15  Yes Donnamae Jude, MD  vitamin C (ASCORBIC ACID) 500 MG tablet Take 1 tablet (500 mg total) by mouth every morning. 07/25/15  Yes Donnamae Jude, MD  warfarin (COUMADIN) 5 MG tablet Take 1 tablet (5 mg total) by mouth as directed. Patient taking differently: Take 7.5-10 mg by mouth See admin instructions. 7.5 mg on all days except on Monday and Friday its 10 mg 07/25/15  Yes Donnamae Jude, MD  nitroGLYCERIN (NITROSTAT) 0.4 MG SL tablet Place 1 tablet (0.4 mg total) under the tongue every 5 (five) minutes as needed. For chest pain 07/25/15   Donnamae Jude, MD   BP 136/92 mmHg  Pulse 75  Temp(Src) 97.9 F (36.6 C) (Oral)  Resp 15  Ht '5\' 8"'$  (1.727 m)  Wt 114.261 kg  BMI 38.31 kg/m2  SpO2 96% Physical Exam CONSTITUTIONAL: Well developed/well nourished HEAD: Normocephalic/atraumatic EYES: EOMI/PERRL ENMT: Mucous membranes moist, abrasion to right side of face, but no focal facial tenderness/stepoffs, no other signs of facial trauma NECK: supple no meningeal signs SPINE/BACK:entire spine nontender CV: irregular, no harsh murmurs noted LUNGS: crackles bilaterally, no apparent distress ABDOMEN: soft, nontender, obese GU:no cva tenderness NEURO: Pt is awake/alert/appropriate, no arm drift.  No facial droop.  He has mild left LE drift noted.  No weakness to right LE EXTREMITIES: pulses normal/equal, full ROM, All extremities/joints palpated/ranged and nontender SKIN: warm, color normal PSYCH: no abnormalities of mood noted, alert and oriented to situation  ED Course  Procedures  2:46 PM Pt with increasing falls over past several weeks, as well as some intermittent confusion He also appears to have mild weakness in left LE Will admit for  further workup  3:17 PM D/w dr Avon Gully with family medicine Will admit to tele OBS With h/o afib and bioprosthetic valve and not fully anticoagulated, concern for occult stroke (h/o confusion, left LE weakness) Pt/family agreeable with plan  Labs Review Labs Reviewed  BASIC METABOLIC PANEL - Abnormal; Notable for the following:    Glucose, Bld 180 (*)    Creatinine, Ser 1.26 (*)    GFR calc non Af Amer 55 (*)    All other components within normal limits  CBC - Abnormal; Notable for the following:    Platelets 132 (*)    All other components within normal limits  PROTIME-INR - Abnormal; Notable for the following:    Prothrombin Time 17.0 (*)    All other components within normal limits  ETHANOL  URINALYSIS, ROUTINE W REFLEX MICROSCOPIC (NOT AT [facility])  URINE  RAPID DRUG SCREEN, HOSP PERFORMED  I-STAT TROPOININ, ED    Imaging Review Dg Chest 2 View  09/18/2015  CLINICAL DATA:  Atrial fibrillation history of coronary artery disease. EXAM: CHEST  2 VIEW COMPARISON:  08/02/2014. FINDINGS: Stable surgical changes from bypass surgery. The heart is mildly enlarged but stable. There is tortuosity and calcification of the thoracic aorta. A prosthetic aortic valve is noted. The lungs are clear of acute process. No pleural effusion. The bony thorax is intact. IMPRESSION: No acute cardiopulmonary findings. Electronically Signed   By: Marijo Sanes M.D.   On: 09/18/2015 12:32   Ct Head Wo Contrast  09/18/2015  CLINICAL DATA:  Weakness, confusion, multiple falls EXAM: CT HEAD WITHOUT CONTRAST TECHNIQUE: Contiguous axial images were obtained from the base of the skull through the vertex without intravenous contrast. COMPARISON:  MRI brain dated 05/18/2015 FINDINGS: No evidence of parenchymal hemorrhage or extra-axial fluid collection. No mass lesion, mass effect, or midline shift. No CT evidence of acute infarction. Subcortical white matter and periventricular small vessel ischemic changes. Global  cortical atrophy.  No ventriculomegaly. The visualized paranasal sinuses are essentially clear. The mastoid air cells are unopacified. No evidence of calvarial fracture. IMPRESSION: No evidence of acute intracranial abnormality. Atrophy with small vessel ischemic changes. Electronically Signed   By: Julian Hy M.D.   On: 09/18/2015 13:56   I have personally reviewed and evaluated these images and lab results as part of my medical decision-making.   EKG Interpretation   Date/Time:  Wednesday September 18 2015 11:52:15 EDT Ventricular Rate:  118 PR Interval:    QRS Duration: 78 QT Interval:  268 QTC Calculation: 375 R Axis:   71 Text Interpretation:  Atrial fibrillation with rapid ventricular response  Anterior infarct , age undetermined Abnormal ECG Confirmed by Christy Gentles   MD, Lorisa Scheid ([id number]) on 09/18/2015 12:39:36 PM      MDM   Final diagnoses:  Weakness  Fall, initial encounter  Subtherapeutic international normalized ratio (INR)  Atrial fibrillation, unspecified type [facility])    Nursing notes including past medical history and social history reviewed and considered in documentation xrays/imaging reviewed by myself and considered during evaluation Labs/vital reviewed myself and considered during evaluation Previous records reviewed and considered - h/o afib, h/o AVR, he is on coumadin     Ripley Fraise, MD 09/18/15 [phone]

## 2015-09-18 NOTE — Telephone Encounter (Signed)
Patient has bad knee pain. SDA scheduled for today at 145 with Dr. Andria Frames patient daughter would like to speak with triage nurse regarding patient falling.

## 2015-09-18 NOTE — ED Notes (Signed)
Pt has had multiple falls over the past few days. Pt's dtr found pt on floor today- appears to have hit face. Pt has small abrasion to right cheek. Pt's dtr noticed pt has had increased swelling in abd and some confusion.

## 2015-09-18 NOTE — ED Notes (Signed)
Pt dtr notes that he has been increasingly SOB and pt is in Afib on monitor in triage

## 2015-09-18 NOTE — Progress Notes (Signed)
ANTICOAGULATION CONSULT NOTE - Initial Consult  Pharmacy Consult for Warfarin Indication: atrial fibrillation  Allergies  Allergen Reactions  . Pineapple Concentrate Nausea And Vomiting    Patient Measurements: Height: 5\' 8"  (172.7 cm) Weight: 251 lb 14.4 oz (114.261 kg) IBW/kg (Calculated) : 68.4 Heparin Dosing Weight:   Vital Signs: Temp: 97.9 F (36.6 C) (04/19 1154) Temp Source: Oral (04/19 1154) BP: 136/92 mmHg (04/19 1315) Pulse Rate: 75 (04/19 1315)  Labs:  Recent Labs  09/18/15 1203  HGB 15.1  HCT 46.4  PLT 132*  LABPROT 17.0*  INR 1.37  CREATININE 1.26*    Estimated Creatinine Clearance: 65.1 mL/min (by C-G formula based on Cr of 1.26).   Medical History: Past Medical History  Diagnosis Date  . Hyperlipidemia   . Hypertension   . CAD (coronary artery disease) 2012    [place]-LAD  . GERD (gastroesophageal reflux disease)   . S/P AVR     #25 mm Edwards pericardial valve Bioprosthetic. Dr Cyndia Bent.  . Atrial fibrillation ([place])   . HEART FAILURE, CONGESTIVE UNSPEC 02/23/2007    Qualifier: Diagnosis of  By: Mellody Drown MD, [facility]    . TOBACCO DEPENDENCE 07/29/2006    Qualifier: History of  By: Carlena Sax  MD, Colletta [place]    . COPD (chronic obstructive pulmonary disease) ([place])   . Type II diabetes mellitus ([place])   . Situational depression     "son passed 11/2013"  . OSA (obstructive sleep apnea) 11/13/2014  . Stented coronary artery 2013    Assessment: 75yo-Male presents with multiple falls over past several days.  Found on floor today with abrasion on face.  PMH includes AFib on warfarin    On Admit: INR 1.37, Hgb 15.1, PLT 132, SCr 1.26 (CrCl 65.1) PTA Warfarin: 7.5 mg daily, except 10 mg on M/F (57.5mg /wk) >> Last dose 7.5 mg on 4/18 at 1800   Goal of Therapy:  INR 2-3 Monitor platelets by anticoagulation protocol: Yes   Plan:  --Warfarin 10 mg on 4/19 --Monitor INR, CBC daily  Viann Fish 09/18/2015,4:57 PM

## 2015-09-18 NOTE — Consult Note (Signed)
Neurology Consultation Reason for Consult:  Referring Physician: Dr Ree Kida  CC: falls  History is obtained from patient, chart  HPI: Matthew Edwards is a 75 y.o. male with complex medical history who tells me that for the last 2 weeks has sustained at least 4 falls.  He denies any preceding symptoms prior to falling but he seems not to be certain about the reason why he falls "I guess I tripped against something".  He is a poor historian but to the best of my ability it seems that he loses consciousness with his falls.  He denies focal leg weakness but instead admits to feeling knee pain blalterally.  To me he did not have any other complaints and specifically denied back pain, confusion, fevers, urinary and bowel incontinence.  However, reading the notes he admitted to others to have bowel incontinence and word finding difficulties.   ROS: A 14 point ROS was performed and is negative except as noted in the HPI.   Past Medical History  Diagnosis Date  . Hyperlipidemia   . Hypertension   . CAD (coronary artery disease) 2012    [place]-LAD  . GERD (gastroesophageal reflux disease)   . S/P AVR     #25 mm Edwards pericardial valve Bioprosthetic. Dr Cyndia Bent.  . Atrial fibrillation ([name])   . HEART FAILURE, CONGESTIVE UNSPEC 02/23/2007    Qualifier: Diagnosis of  By: Mellody Drown MD, [facility]    . TOBACCO DEPENDENCE 07/29/2006    Qualifier: History of  By: Carlena Sax  MD, Colletta [place]    . COPD (chronic obstructive pulmonary disease) ([place])   . Type II diabetes mellitus ([place])   . Situational depression     "son passed 11/2013"  . OSA (obstructive sleep apnea) 11/13/2014  . Stented coronary artery 2013    Family History  Problem Relation Age of Onset  . Heart disease Mother     Social History:  reports that he quit smoking about 11 years ago. His smoking use included Cigarettes. He has a 23 pack-year smoking history. He has never used smokeless tobacco. He reports that he does not drink alcohol or use  illicit drugs.  Exam: Current vital signs: BP 117/93 mmHg  Pulse 90  Temp(Src) 97.9 F (36.6 C) (Oral)  Resp 15  Ht 5\' 8"  (1.727 m)  Wt 114.261 kg (251 lb 14.4 oz)  BMI 38.31 kg/m2  SpO2 94% Vital signs in last 24 hours: Temp:  [97.9 F (36.6 C)] 97.9 F (36.6 C) (04/19 1154) Pulse Rate:  [75-110] 90 (04/19 1815) Resp:  [15] 15 (04/19 1154) BP: (110-136)/(82-98) 117/93 mmHg (04/19 1815) SpO2:  [93 %-97 %] 94 % (04/19 1815) Weight:  [114.261 kg (251 lb 14.4 oz)] 114.261 kg (251 lb 14.4 oz) (04/19 1153)   Physical Exam  Constitutional: Appears well-developed and well-nourished.  Very obese Psych: Affect appropriate to situation Eyes: No scleral injection HENT: No OP obstrucion Head: Normocephalic.  Cardiovascular: Normal rate and regular rhythm.  Respiratory: Effort normal and breath sounds normal to anterior ascultation GI: Soft.  No distension. There is no tenderness.  Skin: WDI  Neuro: Mental Status: Patient is awake, alert, oriented to person, place, month, year, and situation - he is slow Patient is able to give a clear and coherent history - though he is a poor historian No signs of aphasia or neglect Cranial Nerves: II: Visual Fields are full. Pupils are equal, round, and reactive to light. III,IV, VI: EOMI without ptosis or diploplia.  V: Facial sensation is  symmetric to temperature VII: Facial movement is symmetric.  VIII: hearing is intact to voice X: Uvula elevates symmetrically XI: Shoulder shrug is symmetric. XII: tongue is midline without atrophy or fasciculations.  Motor: Tone is normal. Bulk is normal He has symmetric generalized weakness Sensory: Sensation is symmetric to light touch and temperature in the arms and legs except for stocking distribution sensory loss Deep Tendon Reflexes: 2-3+ and symmetric in the biceps and patellae. Plantars: Toes are downgoing bilaterally and no clonus seen Cerebellar: Bilateral minor but visible  ataxia    I have reviewed labs in epic and the results pertinent to this consultation are: appears to have developed chronic renal failure in last 3-4 months  I have reviewed the images obtained: negative head CT scan  Impression: The differential here is somewhat broad and requires multiple imaging studies to diagnose.  To start I would obtain brain MRI.  He has afib and shower of emboli can occur in these patients and present with bilateral vague symptoms that are difficult to localize.  Therefore I agree with brain MRI.  The bowel incontinence is certainly unusual and would not normally be explained by even a shower of embolic strokes.  Therefore I also agree with LS spine MRI but if this is negative would recommend obtaining a thoracic MRI.  We should also make sure to perform orthostatic blood pressure measurements on this patient as his blood pressure was low lying down (0000000 systolic and < 50 dyastolic).  Please also send TSH, B12.  Recommendations: 1) as above.

## 2015-09-18 NOTE — H&P (Signed)
[facility] Admission History and Physical Service Pager: [phone]  Patient name: Matthew Edwards Medical record number: [id number] Date of birth: 02-Nov-1940 Age: 75 y.o. Gender: male  Primary Care Provider: Donnamae Jude, MD Consultants: neurology Code Status: FULL  Chief Complaint: recurrent falls, weakness  Assessment and Plan: Matthew Edwards is a 75 y.o. male presenting with recurrent falls and weakness. PMH is significant for a.fib, HLD, HTN, CAD, CHF, aortic valve replacement, Type II DM, OSA, and COPD.   Recurrent Falls/Weakness: Concern for CVA vs spinal etiology. Reported slurred speech, difficulty word finding, and multiple risk factors concerning for stroke. Is on warfarin at home for hx a.fib, which CVA less likely. Weakness located only in lower extremities with bowel incontinence more concerning for spinal etiology. CT head with chronic small vessel ischemic changes but no acute abnormalities.  - Admit to FPTS with telemetry, attending [facility] - Neurology consulted - appreciate recs - MRI brain - MRI lumbar spine - Standing plain films bilaterally - Neuro checks q2 - Continue home warfarin per pharmacy - PT/OT/SLP eval  Atrial fibrillation: EKG in ED with a.fib. On warfarin at home. INR 1.37.  - Continue home warfarin per pharmacy - AM EKG  CHF: Last echo with EF 50-55% (07/2014). On Lasix, atorvastatin, and benazepril at home.  - Continue Lasix and atorvastatin  HLD: On atorvastatin at home.  - Continue atorvastatin  HTN: On benazepril at home. Normotensive in ED.  - Hold given elevated Cr  Type II DM: On glipizide at home. CBG 180 on admission.  - Continue glipizide  COPD: On Symbicort at home. O2 sat in 90s on RA.  - Continue Symbicort  FEN/GI: heart healthy/carb modified diet Prophylaxis: home warfarin  Disposition: admit to FPTS  History of Present Illness:  Matthew Edwards is a 75 y.o. male presenting with recurrent  falls and weakness.   Patient reports three falls within the past three weeks. He fell last night, and was unable to get up for 2-3 hours due to weakness. He fell again this morning when he tripped on a rug. His daughter came to his house soon after and found him on the floor. She and her two brothers had to pick the patient up off the floor because he was too weak to stand on his own. Patient reports that he felt too weak to pick up his feet when walking, which is why he tripped on the rug. Patient did hit his head when he fell this morning, but denies hitting his head on the other two occasions. Denies LOC.  Patient reports weakness, primarily lower extremity, worsening in the past four days. He says his legs are generally weak all the time, but sometimes acutely worsen, especially in his L knee.  His daughter reports that four days ago, she noticed that he had difficulty findings words when speaking to her on the phone, and his speech also sounded slurred. She also reports that he has a history of dementia, but has been more confused than usual recently.  Patient denies any facial drooping, headaches, changes in vision. Patient does endorse bowel incontinence for the past month.   Review Of Systems: Per HPI with the following additions: denies dysuria, hematuria, chest pain.  Otherwise the remainder of the systems were negative.  Patient Active Problem List   Diagnosis Date Noted  . Weakness 09/18/2015  . Constipation 01/28/2015  . OSA (obstructive sleep apnea) 11/13/2014  . Pulmonary HTN ([place]) 08/24/2014  . Chronic  anticoagulation   . Chronic atrial fibrillation ([place])   . Diabetes mellitus type 2 with complications ([place])   . Acute on chronic congestive heart failure with left ventricular diastolic dysfunction ([place]) 08/02/2014  . Cognitive impairment 07/02/2014  . Altered mental status 06/28/2014  . Abdominal distention 06/28/2014  . Unspecified constipation 12/13/2013  . S/P AVR  12/29/2012  . Diabetic neuropathy ([place]) 11/25/2011  . Excessive tearing 10/06/2011  . Diabetes mellitus ([place]) 08/06/2010  . BENIGN POSITIONAL VERTIGO 06/14/2010  . VITAMIN D DEFICIENCY 02/21/2010  . Primary hyperparathyroidism ([place]) 06/04/2009  . Hypercalcemia 05/29/2009  . Essential hypertension 02/23/2007  . DEPRESSION 12/15/2006  . Coronary atherosclerosis 12/15/2006  . GERD 12/15/2006  . COPD 12/10/2006  . HLD (hyperlipidemia) 07/29/2006  . Alcohol abuse 07/29/2006  . Aortic valve disorder 07/29/2006    Past Medical History: Past Medical History  Diagnosis Date  . Hyperlipidemia   . Hypertension   . CAD (coronary artery disease) 2012    [place]-LAD  . GERD (gastroesophageal reflux disease)   . S/P AVR     #25 mm Edwards pericardial valve Bioprosthetic. Dr Cyndia Bent.  . Atrial fibrillation ([place])   . HEART FAILURE, CONGESTIVE UNSPEC 02/23/2007    Qualifier: Diagnosis of  By: Mellody Drown MD, [facility]    . TOBACCO DEPENDENCE 07/29/2006    Qualifier: History of  By: Carlena Sax  MD, [facility] [place]    . COPD (chronic obstructive pulmonary disease) ([place])   . Type II diabetes mellitus ([place])   . Situational depression     "son passed 11/2013"  . OSA (obstructive sleep apnea) 11/13/2014  . Stented coronary artery 2013   Past Surgical History: Past Surgical History  Procedure Laterality Date  . Aortic valve replacement  07/2010    notes 07/28/2010  . Cardiac valve replacement    . Coronary artery bypass graft  07/2010    "1"/notes 07/28/2010  . Coronary angioplasty with stent placement  2006; 10/2006    "2"; 1/notes 10/01/2010  . Cardiac catheterization  06/2010    Archie Endo 07/01/2010    Social History: Social History  Substance Use Topics  . Smoking status: Former Smoker -- 0.50 packs/day for 46 years    Types: Cigarettes    Quit date: 07/08/2004  . Smokeless tobacco: Never Used  . Alcohol Use: No   Additional social history: Drinks wine at holidays. Lives at home alone.   Please also refer to  relevant sections of EMR.  Family History: Family History  Problem Relation Age of Onset  . Heart disease Mother     Allergies and Medications: Allergies  Allergen Reactions  . Pineapple Concentrate Nausea And Vomiting   No current facility-administered medications on file prior to encounter.   Current Outpatient Prescriptions on File Prior to Encounter  Medication Sig Dispense Refill  . atorvastatin (LIPITOR) 20 MG tablet Take 1 tablet (20 mg total) by mouth daily at 6 PM. 90 tablet 3  . benazepril (LOTENSIN) 20 MG tablet Take 1 tablet (20 mg total) by mouth daily. 90 tablet 3  . Blood Glucose Monitoring Suppl (ACCU-CHEK AVIVA PLUS) W/DEVICE KIT 1 Units by Does not apply route 3 (three) times daily. 1 kit 0  . budesonide-formoterol (SYMBICORT) 80-4.5 MCG/ACT inhaler Inhale 2 puffs into the lungs 2 (two) times daily. 10 Inhaler 11  . cholecalciferol (VITAMIN D) 1000 units tablet Take 1 tablet (1,000 Units total) by mouth daily. 90 tablet 3  . furosemide (LASIX) 40 MG tablet Take 1 tablet (40 mg total) by mouth 2 (  two) times daily. 90 tablet 3  . glipiZIDE (GLUCOTROL) 10 MG tablet Take medication at 5 PM with a meal. 90 tablet 3  . glucose blood (ACCU-CHEK AVIVA PLUS) test strip USE TO CHECK BLOOD SUGARS FOUR TIMES DAILY 400 each 3  . polycarbophil (FIBERCON) 625 MG tablet Take 1 tablet (625 mg total) by mouth daily. 30 tablet 4  . potassium chloride SA (K-DUR,KLOR-CON) 20 MEQ tablet Take 1 tablet (20 mEq total) by mouth daily. 90 tablet 3  . vitamin C (ASCORBIC ACID) 500 MG tablet Take 1 tablet (500 mg total) by mouth every morning. 90 tablet 3  . warfarin (COUMADIN) 5 MG tablet Take 1 tablet (5 mg total) by mouth as directed. (Patient taking differently: Take 7.5-10 mg by mouth See admin instructions. 7.5 mg on all days except on Monday and Friday its 10 mg) 90 tablet 3  . nitroGLYCERIN (NITROSTAT) 0.4 MG SL tablet Place 1 tablet (0.4 mg total) under the tongue every 5 (five) minutes as  needed. For chest pain 25 tablet 6    Objective: BP 117/93 mmHg  Pulse 90  Temp(Src) 97.9 F (36.6 C) (Oral)  Resp 15  Ht _0  (1.727 m)  Wt 251 lb 14.4 oz (114.261 kg)  BMI 38.31 kg/m2  SpO2 94% Exam: General: sitting up in bed, in NAD; family at bedside Eyes: PERRLA, EOMI, arcus senilis bilaterally, no scleral icterus or discharge ENTM: MMM, no oropharyngeal erythema or exudate Neck: supple, no lymphadenopathy Cardiovascular: irregularly irregular Respiratory: CTAB, no wheezes or rhonchi noted Abdomen: soft, non-tender, non-distended, +BS MSK: 3/5 strength LLE, 4/5 strength RLE; 5/5 strength upper extremities bilaterally Skin: small abrasion to R cheek; warm, dry Neuro: A&Ox3, CN II-XII intact Psych: appropriate mood and affect  Labs and Imaging: CBC BMET   Recent Labs Lab 09/18/15 1203  WBC 7.6  HGB 15.1  HCT 46.4  PLT 132*    Recent Labs Lab 09/18/15 1203  NA 137  K 4.3  CL 101  CO2 25  BUN 13  CREATININE 1.26*  GLUCOSE 180*  CALCIUM 9.9     Dg Chest 2 View 09/18/2015  CLINICAL DATA:  Atrial fibrillation history of coronary artery disease. EXAM: CHEST  2 VIEW. IMPRESSION: No acute cardiopulmonary findings.   Ct Head Wo Contrast 09/18/2015  CLINICAL DATA:  Weakness, confusion, multiple falls EXAM: CT HEAD WITHOUT CONTRAST. IMPRESSION: No evidence of acute intracranial abnormality. Atrophy with small vessel ischemic changes.   Verner Mould, MD 09/18/2015, 7:02 PM PGY-1, [place] [facility] pager: [phone], text pages welcome

## 2015-09-18 NOTE — Telephone Encounter (Signed)
Called patient's daughter back regarding patient falling.  Per daughter she took him to ED because he hit his head.  Derl Barrow, RN

## 2015-09-19 ENCOUNTER — Observation Stay (HOSPITAL_COMMUNITY): Payer: Commercial Managed Care - HMO

## 2015-09-19 DIAGNOSIS — M50221 Other cervical disc displacement at C4-C5 level: Secondary | ICD-10-CM | POA: Diagnosis not present

## 2015-09-19 DIAGNOSIS — R531 Weakness: Principal | ICD-10-CM

## 2015-09-19 DIAGNOSIS — R791 Abnormal coagulation profile: Secondary | ICD-10-CM | POA: Diagnosis not present

## 2015-09-19 DIAGNOSIS — M170 Bilateral primary osteoarthritis of knee: Secondary | ICD-10-CM | POA: Diagnosis not present

## 2015-09-19 DIAGNOSIS — I480 Paroxysmal atrial fibrillation: Secondary | ICD-10-CM | POA: Diagnosis not present

## 2015-09-19 DIAGNOSIS — W19XXXA Unspecified fall, initial encounter: Secondary | ICD-10-CM

## 2015-09-19 DIAGNOSIS — I4891 Unspecified atrial fibrillation: Secondary | ICD-10-CM | POA: Insufficient documentation

## 2015-09-19 DIAGNOSIS — R296 Repeated falls: Secondary | ICD-10-CM | POA: Diagnosis not present

## 2015-09-19 DIAGNOSIS — I10 Essential (primary) hypertension: Secondary | ICD-10-CM | POA: Diagnosis not present

## 2015-09-19 DIAGNOSIS — R269 Unspecified abnormalities of gait and mobility: Secondary | ICD-10-CM

## 2015-09-19 DIAGNOSIS — R410 Disorientation, unspecified: Secondary | ICD-10-CM | POA: Diagnosis not present

## 2015-09-19 DIAGNOSIS — M4806 Spinal stenosis, lumbar region: Secondary | ICD-10-CM | POA: Diagnosis not present

## 2015-09-19 DIAGNOSIS — E118 Type 2 diabetes mellitus with unspecified complications: Secondary | ICD-10-CM

## 2015-09-19 DIAGNOSIS — E785 Hyperlipidemia, unspecified: Secondary | ICD-10-CM | POA: Diagnosis not present

## 2015-09-19 DIAGNOSIS — R29898 Other symptoms and signs involving the musculoskeletal system: Secondary | ICD-10-CM | POA: Diagnosis not present

## 2015-09-19 LAB — CBC
HCT: 45.3 % (ref 39.0–52.0)
Hemoglobin: 14.5 g/dL (ref 13.0–17.0)
MCH: 28.9 pg (ref 26.0–34.0)
MCHC: 32 g/dL (ref 30.0–36.0)
MCV: 90.4 fL (ref 78.0–100.0)
Platelets: 142 10*3/uL — ABNORMAL LOW (ref 150–400)
RBC: 5.01 MIL/uL (ref 4.22–5.81)
RDW: 13.3 % (ref 11.5–15.5)
WBC: 7.5 10*3/uL (ref 4.0–10.5)

## 2015-09-19 LAB — GLUCOSE, CAPILLARY
Glucose-Capillary: 230 mg/dL — ABNORMAL HIGH (ref 65–99)
Glucose-Capillary: 259 mg/dL — ABNORMAL HIGH (ref 65–99)
Glucose-Capillary: 184 mg/dL — ABNORMAL HIGH (ref 65–99)
Glucose-Capillary: 99 mg/dL (ref 65–99)

## 2015-09-19 LAB — BASIC METABOLIC PANEL
Anion gap: 10 (ref 5–15)
BUN: 10 mg/dL (ref 4–21)
BUN: 10 mg/dL (ref 6–20)
CO2: 26 mmol/L (ref 22–32)
Calcium: 9.6 mg/dL (ref 8.9–10.3)
Chloride: 100 mmol/L — ABNORMAL LOW (ref 101–111)
Creatinine, Ser: 0.98 mg/dL (ref 0.61–1.24)
Creatinine: 1 mg/dL (ref 0.6–1.3)
GFR calc Af Amer: >60 mL/min (ref >60)
GFR calc non Af Amer: >60 mL/min (ref >60)
Glucose, Bld: 217 mg/dL — ABNORMAL HIGH (ref 65–99)
Glucose: 217 mg/dL
Potassium: 4.4 mmol/L (ref 3.5–5.1)
Sodium: 136 mmol/L (ref 135–145)
Sodium: 136 mmol/L — AB (ref 137–147)
Sodium: 236 mmol/L — AB (ref 137–147)

## 2015-09-19 LAB — PROTIME-INR
INR: 1.46 (ref 0.00–1.49)
Prothrombin Time: 17.8 seconds — ABNORMAL HIGH (ref 11.6–15.2)

## 2015-09-19 LAB — VITAMIN B12: Vitamin B-12: 185 pg/mL (ref 180–914)

## 2015-09-19 LAB — TSH: TSH: 1.014 u[IU]/mL (ref 0.350–4.500)

## 2015-09-19 MED ORDER — GADOBENATE DIMEGLUMINE 529 MG/ML IV SOLN
20.0000 mL | Freq: Once | INTRAVENOUS | Status: AC | PRN
  Administered 2015-09-19: 20 mL via INTRAVENOUS

## 2015-09-19 MED ORDER — BENAZEPRIL HCL 20 MG PO TABS
20.0000 mg | ORAL_TABLET | Freq: Every day | ORAL | Status: DC
  Administered 2015-09-19 – 2015-09-20 (×2): 20 mg via ORAL
  Filled 2015-09-19 (×2): qty 1

## 2015-09-19 MED ORDER — INSULIN ASPART 100 UNIT/ML ~~LOC~~ SOLN: 0.0000 [IU] | Freq: Every day | SUBCUTANEOUS | Status: DC

## 2015-09-19 MED ORDER — METOPROLOL TARTRATE 1 MG/ML IV SOLN
5.0000 mg | Freq: Once | INTRAVENOUS | Status: AC
  Administered 2015-09-19: 5 mg via INTRAVENOUS
  Filled 2015-09-19: qty 5

## 2015-09-19 MED ORDER — WARFARIN SODIUM 10 MG PO TABS
10.0000 mg | ORAL_TABLET | Freq: Once | ORAL | Status: AC
  Administered 2015-09-19: 10 mg via ORAL
  Filled 2015-09-19: qty 1

## 2015-09-19 MED ORDER — INSULIN ASPART 100 UNIT/ML ~~LOC~~ SOLN
0.0000 [IU] | Freq: Three times a day (TID) | SUBCUTANEOUS | Status: DC
  Administered 2015-09-19: 5 [IU] via SUBCUTANEOUS
  Administered 2015-09-19: 2 [IU] via SUBCUTANEOUS
  Administered 2015-09-20: 1 [IU] via SUBCUTANEOUS
  Administered 2015-09-20: 2 [IU] via SUBCUTANEOUS
  Administered 2015-09-20: 3 [IU] via SUBCUTANEOUS

## 2015-09-19 MED ORDER — ATORVASTATIN CALCIUM 40 MG PO TABS
40.0000 mg | ORAL_TABLET | Freq: Every day | ORAL | Status: DC
  Administered 2015-09-19 – 2015-09-20 (×2): 40 mg via ORAL
  Filled 2015-09-19 (×2): qty 1

## 2015-09-19 MED ORDER — CYANOCOBALAMIN 1000 MCG/ML IJ SOLN
1000.0000 ug | Freq: Once | INTRAMUSCULAR | Status: DC
  Filled 2015-09-19: qty 1

## 2015-09-19 MED ORDER — DILTIAZEM HCL ER COATED BEADS 120 MG PO CP24
120.0000 mg | ORAL_CAPSULE | Freq: Every day | ORAL | Status: DC
  Administered 2015-09-19 – 2015-09-20 (×2): 120 mg via ORAL
  Filled 2015-09-19 (×2): qty 1

## 2015-09-19 NOTE — Care Management Obs Status (Signed)
[place] NOTIFICATION   Patient Details  Name: JONIEL KOKOSZKA MRN: [id number] Date of Birth: 08/30/1940   Medicare Observation Status Notification Given:  Yes    Bethena Roys, RN 09/19/2015, 1:14 PM

## 2015-09-19 NOTE — Evaluation (Signed)
Speech Language Pathology Evaluation Patient Details Name: Matthew Edwards MRN: [id number] DOB: 01/31/1941 Today's Date: 09/19/2015 Time: [id number] SLP Time Calculation (min) (ACUTE ONLY): 12 min  Problem List:  Patient Active Problem List   Diagnosis Date Noted  . Weakness 09/18/2015  . Constipation 01/28/2015  . OSA (obstructive sleep apnea) 11/13/2014  . Pulmonary HTN ([place]) 08/24/2014  . Chronic anticoagulation   . Chronic atrial fibrillation ([place])   . Diabetes mellitus type 2 with complications ([place])   . Acute on chronic congestive heart failure with left ventricular diastolic dysfunction ([place]) 08/02/2014  . Cognitive impairment 07/02/2014  . Altered mental status 06/28/2014  . Abdominal distention 06/28/2014  . Unspecified constipation 12/13/2013  . S/P AVR 12/29/2012  . Diabetic neuropathy ([place]) 11/25/2011  . Excessive tearing 10/06/2011  . Diabetes mellitus ([place]) 08/06/2010  . BENIGN POSITIONAL VERTIGO 06/14/2010  . VITAMIN D DEFICIENCY 02/21/2010  . Primary hyperparathyroidism ([place]) 06/04/2009  . Hypercalcemia 05/29/2009  . Essential hypertension 02/23/2007  . DEPRESSION 12/15/2006  . Coronary atherosclerosis 12/15/2006  . GERD 12/15/2006  . COPD 12/10/2006  . HLD (hyperlipidemia) 07/29/2006  . Alcohol abuse 07/29/2006  . Aortic valve disorder 07/29/2006   Past Medical History:  Past Medical History  Diagnosis Date  . Hyperlipidemia   . Hypertension   . CAD (coronary artery disease) 2012    [place]-LAD  . GERD (gastroesophageal reflux disease)   . S/P AVR     #25 mm Edwards pericardial valve Bioprosthetic. Dr Cyndia Bent.  . Atrial fibrillation ([place])   . HEART FAILURE, CONGESTIVE UNSPEC 02/23/2007    Qualifier: Diagnosis of  By: Mellody Drown MD, [facility]    . TOBACCO DEPENDENCE 07/29/2006    Qualifier: History of  By: Carlena Sax  MD, [facility] [place]    . COPD (chronic obstructive pulmonary disease) ([place])   . Type II diabetes mellitus ([place])   . Situational depression     "son  passed 11/2013"  . OSA (obstructive sleep apnea) 11/13/2014  . Stented coronary artery 2013   Past Surgical History:  Past Surgical History  Procedure Laterality Date  . Aortic valve replacement  07/2010    notes 07/28/2010  . Cardiac valve replacement    . Coronary artery bypass graft  07/2010    "1"/notes 07/28/2010  . Coronary angioplasty with stent placement  2006; 10/2006    "2"; 1/notes 10/01/2010  . Cardiac catheterization  06/2010    /notes 07/01/2010   HPI:  75 y.o. male presenting with recurrent falls and weakness. PMH is significant for a.fib, HLD, HTN, CAD, CHF, aortic valve replacement, Type II DM, OSA, and COPD. Ct negative for acute event; MRI pending.    Assessment / Plan / Recommendation Clinical Impression  Pt presents with no acute changes in language or speech. He has baseline deficits in cognition specific to higher-level problem solving and memory, according to his daughter, Matthew Edwards, and supported by testing today.  Matthew Edwards and her sister complete their father's grocery shopping, fix basic meals for him.  His cognitive function today appears consistent with baseline behaviors.  Matthew Edwards agrees.  No SLP f/u is warranted.     SLP Assessment  Patient does not need any further Speech Lanaguage Pathology Services    Follow Up Recommendations  None    Frequency and Duration           SLP Evaluation Prior Functioning  Cognitive/Linguistic Baseline: Baseline deficits Baseline deficit details: memory Type of Home: Apartment  Lives With: Alone (family grocery shops for him and fixes basic  meals) Available Help at Discharge: Family;Available PRN/intermittently Vocation: Retired   Associate Professor  Overall Cognitive Status: History of cognitive impairments - at baseline Arousal/Alertness: Awake/alert Orientation Level: Oriented X4 Attention: Sustained Sustained Attention: Appears intact Memory: Impaired Memory Impairment: Storage deficit;Retrieval deficit Awareness:  Impaired Problem Solving: Appears intact (for basic functions)    Comprehension  Auditory Comprehension Overall Auditory Comprehension: Appears within functional limits for tasks assessed Visual Recognition/Discrimination Discrimination: Within Function Limits    Expression Verbal Expression Overall Verbal Expression: Appears within functional limits for tasks assessed Written Expression Dominant Hand: Right   Oral / Motor  Oral Motor/Sensory Function Overall Oral Motor/Sensory Function: Within functional limits Motor Speech Overall Motor Speech: Appears within functional limits for tasks assessed   GO          Functional Assessment Tool Used: MOCA Functional Limitations: Memory Memory Current Status [id number]): At least 40 percent but less than 60 percent impaired, limited or restricted Memory Goal Status [id number]): At least 40 percent but less than 60 percent impaired, limited or restricted Memory Discharge Status [id number]): At least 40 percent but less than 60 percent impaired, limited or restricted         Matthew Edwards 09/19/2015, 11:10 AM

## 2015-09-19 NOTE — Progress Notes (Signed)
ANTICOAGULATION CONSULT NOTE - Follow Up Consult  Pharmacy Consult:  Coumadin Indication: atrial fibrillation  Allergies  Allergen Reactions  . Pineapple Concentrate Nausea And Vomiting    Patient Measurements: Height: 5\' 8"  (172.7 cm) Weight: 251 lb 14.4 oz (114.261 kg) IBW/kg (Calculated) : 68.4  Vital Signs: BP: 110/83 mmHg (04/20 0618) Pulse Rate: 128 (04/20 0618)  Labs:  Recent Labs  09/18/15 1203 09/19/15 0331  HGB 15.1 14.5  HCT 46.4 45.3  PLT 132* 142*  LABPROT 17.0* 17.8*  INR 1.37 1.46  CREATININE 1.26* 0.98    Estimated Creatinine Clearance: 83.7 mL/min (by C-G formula based on Cr of 0.98).     Assessment: 89 YOM presented after sustaining at least 4 falls in the last 2 weeks.  Patient was on Coumadin from [facility] for history of Afib and to continue here (head CT negative for bleed).  Patient's INR remain sub-therapeutic; no bleeding reported.   Goal of Therapy:  INR 2-3    Plan:  - Coumadin 10mg  PO today - Daily PT / INR - F/U starting SSI, MRI, long-term AC plan given fall risk   Jalisia Puchalski D. Mina Marble, PharmD, BCPS Pager:  [phone] 09/19/2015, 7:59 AM

## 2015-09-19 NOTE — Progress Notes (Signed)
Interval History:                                                                                                                      Matthew Edwards is an 75 y.o. male patient with multiple falls and unsteady gait in the setting of negative MRI brain. His B12 and TSH are within normal limits.    Past Medical History: Past Medical History  Diagnosis Date  . Hyperlipidemia   . Hypertension   . CAD (coronary artery disease) 2012    [place]-LAD  . GERD (gastroesophageal reflux disease)   . S/P AVR     #25 mm Edwards pericardial valve Bioprosthetic. Dr Cyndia Bent.  . Atrial fibrillation ([place])   . HEART FAILURE, CONGESTIVE UNSPEC 02/23/2007    Qualifier: Diagnosis of  By: Mellody Drown MD, [facility]    . TOBACCO DEPENDENCE 07/29/2006    Qualifier: History of  By: Carlena Sax  MD, [place]    . COPD (chronic obstructive pulmonary disease) ([place])   . Type II diabetes mellitus ([place])   . Situational depression     "son passed 11/2013"  . OSA (obstructive sleep apnea) 11/13/2014  . Stented coronary artery 2013    Past Surgical History  Procedure Laterality Date  . Aortic valve replacement  07/2010    notes 07/28/2010  . Cardiac valve replacement    . Coronary artery bypass graft  07/2010    "1"/notes 07/28/2010  . Coronary angioplasty with stent placement  2006; 10/2006    "2"; 1/notes 10/01/2010  . Cardiac catheterization  06/2010    /notes 07/01/2010    Family History: Family History  Problem Relation Age of Onset  . Heart disease Mother     Social History:   reports that he quit smoking about 11 years ago. His smoking use included Cigarettes. He has a 23 pack-year smoking history. He has never used smokeless tobacco. He reports that he does not drink alcohol or use illicit drugs.  Allergies:  Allergies  Allergen Reactions  . Pineapple Concentrate Nausea And Vomiting     Medications:                                                                                                                          Current facility-administered medications:  .   stroke: mapping our early stages of recovery book, , Does not apply, Once, Verner Mould, MD .  albuterol (PROVENTIL) (2.5 MG/3ML) 0.083% nebulizer solution 2.5 mg, 2.5 mg,  Nebulization, Q6H PRN, Lupita Dawn, MD .  atorvastatin (LIPITOR) tablet 40 mg, 40 mg, Oral, q1800, Nicolette Bang, DO .  benazepril (LOTENSIN) tablet 20 mg, 20 mg, Oral, Daily, Mercy Riding, MD .  cholecalciferol (VITAMIN D) tablet 1,000 Units, 1,000 Units, Oral, Daily, Verner Mould, MD, 1,000 Units at 09/19/15 1018 .  cyanocobalamin ((VITAMIN B-12)) injection 1,000 mcg, 1,000 mcg, Intramuscular, Once, Mercy Riding, MD .  diltiazem (CARDIZEM CD) 24 hr capsule 120 mg, 120 mg, Oral, Daily, Mercy Riding, MD, 120 mg at 09/19/15 1017 .  furosemide (LASIX) tablet 40 mg, 40 mg, Oral, BID, Verner Mould, MD, 40 mg at 09/19/15 1018 .  glipiZIDE (GLUCOTROL) tablet 10 mg, 10 mg, Oral, Q supper, Verner Mould, MD .  insulin aspart (novoLOG) injection 0-5 Units, 0-5 Units, Subcutaneous, QHS, Taye T Gonfa, MD .  insulin aspart (novoLOG) injection 0-9 Units, 0-9 Units, Subcutaneous, TID WC, Mercy Riding, MD .  mometasone-formoterol (DULERA) 100-5 MCG/ACT inhaler 2 puff, 2 puff, Inhalation, BID, Verner Mould, MD, 2 puff at 09/18/15 2103 .  polycarbophil (FIBERCON) tablet 625 mg, 625 mg, Oral, Daily, Verner Mould, MD, 625 mg at 09/19/15 1018 .  potassium chloride SA (K-DUR,KLOR-CON) CR tablet 20 mEq, 20 mEq, Oral, Daily, Verner Mould, MD, 20 mEq at 09/19/15 1018 .  predniSONE (DELTASONE) tablet 50 mg, 50 mg, Oral, Q breakfast, Lupita Dawn, MD, 50 mg at 09/18/15 2244 .  warfarin (COUMADIN) tablet 10 mg, 10 mg, Oral, ONCE-1800, Tyrone Apple, RPH .  Warfarin - Pharmacist Dosing Inpatient, , Does not apply, q1800, Rebecka Apley, [facility]   Neurologic Examination:                                                                                                      Today's Vitals   09/19/15 0818 09/19/15 1017 09/19/15 1026 09/19/15 1118  BP: 112/65 135/110 118/80 108/83  Pulse: 118   86  Temp:    97.5 F (36.4 C)  TempSrc:    Oral  Resp: 19  21 24   Height:      Weight:      SpO2:    96%    Evaluation of higher integrative functions including: Level of alertness: Alert,  Oriented to time, place and person Speech: fluent, no evidence of dysarthria or aphasia noted.  Test the following cranial nerves: 2-12 grossly intact Motor examination: Normal tone, bulk, full 5/5 motor strength in all 4 extremities Examination of sensation : Normal and symmetric sensation to pinprick in UE, decreased temperature, vibratory and no proprioception in feet. decreasesed sensation extends to knee.  Examination of deep tendon reflexes: 1+, normal and symmetric in all upper extremities 2+ in LE Test coordination: Normal finger nose testing, Gait: magnetic start with sudden forward motion and decreased arm swing. Able to lift feet without shuffling but fenestrated turning along with loss of balance. He would sway to both sides when walking.    Lab Results: Basic Metabolic Panel:  Recent Labs Lab 09/18/15 1203 09/19/15 0331  NA 137 136  K 4.3 4.4  CL 101 100*  CO2 25 26  GLUCOSE 180* 217*  BUN 13 10  CREATININE 1.26* 0.98  CALCIUM 9.9 9.6    Liver Function Tests: No results for input(s): AST, ALT, ALKPHOS, BILITOT, PROT, ALBUMIN in the last 168 hours. No results for input(s): LIPASE, AMYLASE in the last 168 hours. No results for input(s): AMMONIA in the last 168 hours.  CBC:  Recent Labs Lab 09/18/15 1203 09/19/15 0331  WBC 7.6 7.5  HGB 15.1 14.5  HCT 46.4 45.3  MCV 90.8 90.4  PLT 132* 142*    Cardiac Enzymes: No results for input(s): CKTOTAL, CKMB, CKMBINDEX, TROPONINI in the last 168 hours.  Lipid Panel: No results for input(s): CHOL, TRIG, HDL, CHOLHDL, VLDL, LDLCALC in the last 168  hours.  CBG:  Recent Labs Lab 09/18/15 2220 09/19/15 0731 09/19/15 1133  GLUCAP 132* 230* 45*    Microbiology: Results for orders placed or performed in visit on 02/01/15  Urine culture     Status: None   Collection Time: 02/01/15 10:40 AM  Result Value Ref Range Status   Colony Count NO GROWTH  Final   Organism ID, Bacteria NO GROWTH  Final    Imaging: Dg Chest 2 View  09/18/2015  CLINICAL DATA:  Atrial fibrillation history of coronary artery disease. EXAM: CHEST  2 VIEW COMPARISON:  08/02/2014. FINDINGS: Stable surgical changes from bypass surgery. The heart is mildly enlarged but stable. There is tortuosity and calcification of the thoracic aorta. A prosthetic aortic valve is noted. The lungs are clear of acute process. No pleural effusion. The bony thorax is intact. IMPRESSION: No acute cardiopulmonary findings. Electronically Signed   By: Marijo Sanes M.D.   On: 09/18/2015 12:32   Ct Head Wo Contrast  09/18/2015  CLINICAL DATA:  Weakness, confusion, multiple falls EXAM: CT HEAD WITHOUT CONTRAST TECHNIQUE: Contiguous axial images were obtained from the base of the skull through the vertex without intravenous contrast. COMPARISON:  MRI brain dated 05/18/2015 FINDINGS: No evidence of parenchymal hemorrhage or extra-axial fluid collection. No mass lesion, mass effect, or midline shift. No CT evidence of acute infarction. Subcortical white matter and periventricular small vessel ischemic changes. Global cortical atrophy.  No ventriculomegaly. The visualized paranasal sinuses are essentially clear. The mastoid air cells are unopacified. No evidence of calvarial fracture. IMPRESSION: No evidence of acute intracranial abnormality. Atrophy with small vessel ischemic changes. Electronically Signed   By: Julian Hy M.D.   On: 09/18/2015 13:56    Assessment and plan:   Matthew Edwards is an 75 y.o. male patient with multiple falls and gait ataxia. He does have some parkinson like  features such as magnetic gait, inability to get up from seated position without using arms and fenestrated turns. However the bowel incontinence would not fit this. He also has significant neuropathy of his LE including no proprioception, vibration or temperature to his knees. He also has brisk reflexes of his knees which would be unusual for his neuropathy.   Recommend 1) look further in to his cervical spine for brisk reflexes in setting of neuropathy along with gait ataxia 2) MRI lumbar to evaluate for his incontinence.  3) If no source is found he will need a EMG/NCV as a out patient.   Will be seen by Dr. Silverio Decamp. Please see his attestation note for A/P for any additional work up recommendations.

## 2015-09-19 NOTE — Progress Notes (Signed)
Family Medicine Teaching Service Daily Progress Note Intern Pager: [phone]  Patient name: Matthew Edwards Medical record number: [id number] Date of birth: Mar 29, 1941 Age: 75 y.o. Gender: male  Primary Care Provider: Donnamae Jude, MD Consultants: neurology Code Status: full  Pt Overview and Major Events to Date:  4/19: admitted with due recurrent fall, LE weakness  Assessment and Plan: Matthew Edwards is a 75 y.o. male presenting with recurrent falls and weakness. PMH is significant for a.fib, HLD, HTN, CAD, CHF, aortic valve replacement, Type II DM, OSA, and COPD.   Recurrent Falls/Weakness: orthostat positive by pulse. Localized weakness to LE & history of bowel incontinence concerning for spinal etiology. However, patient with normal rectal tone on exam. Dysarthria & aphasia concerning for CVA. He has significant risk factors. Patient on warfarin for Afib but INR subtherapeutic at 1.37. CT head with chronic small vessel ischemic changes. Arrythmia is also possible given history of Afib. EKG significant for Afib with RVR. UDS and EtOH negative. Vit B12 low normal. TSH normal. No leukocytosis or signs of infection. - Appreciate neuro recs - Lumbar and Brain MRI - Bilateral standing knee film - Neuro checks q4 - A1c 7.5, Lipid 145/181/39/70 in 07/2015, TSH 1.4 in 06/2014 - Vit B12 level - Continue home warfarin per pharmacy - continue lipitor. ASCVD score >35 - cardiac monitor - PT/OT - Vitamin B12 1000 mcg once - f/u RPR  Atrial fibrillation: EKG in ED with afib. CHAD-Vasc 5. On warfarin at home but INR 1.37 on presentation. No rate or rhythm controller.  - Diltiazem CD 120 mg q24hrs - Continue home warfarin per pharmacy - IV metoprolol 5 mg as needed   CHF/AVR bioprostetic valve: no crackles on exam. CXR without pulmonary congestion. Last echo in 3/16 with EF 50-55% & PAPP of 58. On Lasix and benazepril at home.  - Continue home benazepril - Continue home lasix  HTN: On  benazepril at home.  - continue home benazepril  Type II DM: A1c 7.5 in 07/2015. On glipizide at home. CBG stable  - hold glipizide - SSI thin  COPD: exam with wheezing bilaterally - Prednisone 50 mg daily,  - Dulera (on Symbicort at home) - Albuterol as needed - O2 sat in 90s on RA.   HLD: lipid panel 145/181/39/70 in 07/2015 - Continue home atorvastatin  FEN/GI:  heart healthy/carb modified diet  Prophylaxis: home warfarin  Disposition: regular floor with tele  Subjective:  No events overnight. Denies headache, chest pain or shortness of breath. He is still weak. Just woke up and didn't get out of bed this morning.   Objective: Temp:  [97.9 F (36.6 C)] 97.9 F (36.6 C) (04/19 1154) Pulse Rate:  [52-110] 98 (04/20 0218) Resp:  [15-32] 26 (04/20 0218) BP: (110-139)/(73-104) 110/85 mmHg (04/20 0218) SpO2:  [89 %-97 %] 89 % (04/20 0218) Weight:  [251 lb 14.4 oz (114.261 kg)] 251 lb 14.4 oz (114.261 kg) (04/19 1153) Physical Exam: Gen: appears obese Neck: supple, no LAD CV: regular rate and rythm. S1 & S2 audible, no murmurs. Resp: no apparent work of breathing, wheeze bilaterally, no crackles GI: bowel sounds normal, no tenderness to palpation, no rebound or guarding, no mass.  GU: no suprapubic tenderness Skin: no lesion Neuro:  Mental status: AAO x4  Speech: normal Cranial nerve: CN II-XII grossly intact  Motor: 4/5 in LEs Sensory: light touch  Laboratory:  Recent Labs Lab 09/18/15 1203  WBC 7.6  HGB 15.1  HCT 46.4  PLT 132*  Recent Labs Lab 09/18/15 1203  NA 137  K 4.3  CL 101  CO2 25  BUN 13  CREATININE 1.26*  CALCIUM 9.9  GLUCOSE 180*    Imaging/Diagnostic Tests: Dg Chest 2 View  09/18/2015  CLINICAL DATA:  Atrial fibrillation history of coronary artery disease. EXAM: CHEST  2 VIEW COMPARISON:  08/02/2014. FINDINGS: Stable surgical changes from bypass surgery. The heart is mildly enlarged but stable. There is tortuosity and  calcification of the thoracic aorta. A prosthetic aortic valve is noted. The lungs are clear of acute process. No pleural effusion. The bony thorax is intact. IMPRESSION: No acute cardiopulmonary findings. Electronically Signed   By: Marijo Sanes M.D.   On: 09/18/2015 12:32   Ct Head Wo Contrast  09/18/2015  CLINICAL DATA:  Weakness, confusion, multiple falls EXAM: CT HEAD WITHOUT CONTRAST TECHNIQUE: Contiguous axial images were obtained from the base of the skull through the vertex without intravenous contrast. COMPARISON:  MRI brain dated 05/18/2015 FINDINGS: No evidence of parenchymal hemorrhage or extra-axial fluid collection. No mass lesion, mass effect, or midline shift. No CT evidence of acute infarction. Subcortical white matter and periventricular small vessel ischemic changes. Global cortical atrophy.  No ventriculomegaly. The visualized paranasal sinuses are essentially clear. The mastoid air cells are unopacified. No evidence of calvarial fracture. IMPRESSION: No evidence of acute intracranial abnormality. Atrophy with small vessel ischemic changes. Electronically Signed   By: Julian Hy M.D.   On: 09/18/2015 13:56    Mercy Riding, MD 09/19/2015, 4:27 AM PGY-1, [place] Intern pager: [phone], text pages welcome

## 2015-09-19 NOTE — Progress Notes (Signed)
Patient's HR is in the 120's- 130's and is A. Fib. No meds ordered for this. MD notified.

## 2015-09-19 NOTE — Evaluation (Signed)
Physical Therapy Evaluation Patient Details Name: Matthew Edwards MRN: [id number] DOB: Feb 03, 1941 Today's Date: 09/19/2015   History of Present Illness  75 y.o. male admitted to [facility] on 09/18/15 for weakness and recurrent falls.  Head CT was negative, but further medical, cardiac and neuro workup in progress.  On 09/19/15 pt found to be in a-fib with rates in the 120s-130s.  Pt with significant PMHx of A-fib, HTN, CAD, s/p AVR, CHF, COPD, DM2, coronary artery stent, and CABG.    Clinical Impression  Pt was able to walk a short distance around his room, but is not used to using a RW, so I would like to assess his safety and stability with a cane before d/c.  He has participated in [place] before and would like and is appropriate for referral again.  He has significant h/o recent falls and is unsteady on his feet.   PT to follow acutely for deficits listed below.       Follow Up Recommendations Home health PT    Equipment Recommendations  None recommended by PT    Recommendations for Other Services   NA    Precautions / Restrictions Precautions Precautions: Fall Precaution Comments: pt with 4 falls in the past month.  He thinks his left leg gives out.       Mobility  Bed Mobility               General bed mobility comments: Pt seated EOB   Transfers Overall transfer level: Needs assistance Equipment used: Rolling walker (2 wheeled) Transfers: Sit to/from Stand Sit to Stand: Supervision         General transfer comment: supervision for safety verbal cues for safe hand placement.   Ambulation/Gait Ambulation/Gait assistance: Min guard Ambulation Distance (Feet): 40 Feet Assistive device: Rolling walker (2 wheeled) Gait Pattern/deviations: Step-through pattern;Staggering left;Staggering right Gait velocity: decreased   General Gait Details: Pt with mildly staggering gait pattern and significant safety issues using RW (picking it up every step, picking it up to turn). Max  verbal cues for safe use of RW.  HR 96-109         Balance Overall balance assessment: Needs assistance Sitting-balance support: Feet supported;No upper extremity supported Sitting balance-Leahy Scale: Good     Standing balance support: Bilateral upper extremity supported Standing balance-Leahy Scale: Poor                               Pertinent Vitals/Pain Pain Assessment: No/denies pain    Home Living Family/patient expects to be discharged to:: Private residence Living Arrangements: Alone Available Help at Discharge: Family;Available PRN/intermittently Type of Home: Apartment Home Access: Stairs to enter Entrance Stairs-Rails: None Entrance Stairs-Number of Steps: 1 Home Layout: One level Home Equipment: Grab bars - tub/shower;Walker - 2 wheels;Cane - single point;Bedside commode      Prior Function Level of Independence: Independent with assistive device(s)         Comments: uses a cane at all times.      Hand Dominance   Dominant Hand: Right    Extremity/Trunk Assessment   Upper Extremity Assessment: Generalized weakness           Lower Extremity Assessment: Generalized weakness (equal bil)      Cervical / Trunk Assessment: Normal  Communication   Communication: No difficulties  Cognition Arousal/Alertness: Awake/alert Behavior During Therapy: WFL for tasks assessed/performed Overall Cognitive Status: History of cognitive impairments - at baseline  Area of Impairment: Orientation Orientation Level: Time ("2020" "2016")   Memory: Decreased short-term memory                       Assessment/Plan    PT Assessment Patient needs continued PT services  PT Diagnosis Difficulty walking;Abnormality of gait;Generalized weakness   PT Problem List Decreased strength;Decreased activity tolerance;Decreased balance;Decreased mobility;Decreased knowledge of use of DME;Decreased cognition;Decreased safety awareness;Decreased knowledge  of precautions;Cardiopulmonary status limiting activity  PT Treatment Interventions DME instruction;Gait training;Functional mobility training;Stair training;Therapeutic activities;Balance training;Therapeutic exercise;Neuromuscular re-education;Cognitive remediation;Patient/family education   PT Goals (Current goals can be found in the Care Plan section) Acute Rehab PT Goals Patient Stated Goal: to stop falling so much PT Goal Formulation: With patient Time For Goal Achievement: 10/03/15 Potential to Achieve Goals: Good    Frequency Min 3X/week   Barriers to discharge Decreased caregiver support pt lives alone and family only checks in, doesn't help per pt       End of Session Equipment Utilized During Treatment: Gait belt Activity Tolerance: Patient tolerated treatment well Patient left: in bed;with bed alarm set (seated EOB)      Functional Assessment Tool Used: assist level Functional Limitation: Mobility: Walking and moving around Mobility: Walking and Moving Around Current Status [id number]): At least 20 percent but less than 40 percent impaired, limited or restricted Mobility: Walking and Moving Around Goal Status [phone]): At least 1 percent but less than 20 percent impaired, limited or restricted    Time: 1027-1047 PT Time Calculation (min) (ACUTE ONLY): 20 min   Charges:   PT Evaluation $PT Eval Moderate Complexity: 1 Procedure     PT G Codes:   PT G-Codes **NOT FOR INPATIENT CLASS** Functional Assessment Tool Used: assist level Functional Limitation: Mobility: Walking and moving around Mobility: Walking and Moving Around Current Status [id number]): At least 20 percent but less than 40 percent impaired, limited or restricted Mobility: Walking and Moving Around Goal Status [phone]): At least 1 percent but less than 20 percent impaired, limited or restricted    Napoleon Monacelli B. [place], [place], DPT [phone]   09/19/2015, 11:03 AM

## 2015-09-20 DIAGNOSIS — I10 Essential (primary) hypertension: Secondary | ICD-10-CM | POA: Diagnosis not present

## 2015-09-20 DIAGNOSIS — J449 Chronic obstructive pulmonary disease, unspecified: Secondary | ICD-10-CM | POA: Diagnosis not present

## 2015-09-20 DIAGNOSIS — K219 Gastro-esophageal reflux disease without esophagitis: Secondary | ICD-10-CM | POA: Diagnosis not present

## 2015-09-20 DIAGNOSIS — I2510 Atherosclerotic heart disease of native coronary artery without angina pectoris: Secondary | ICD-10-CM | POA: Diagnosis not present

## 2015-09-20 DIAGNOSIS — R4189 Other symptoms and signs involving cognitive functions and awareness: Secondary | ICD-10-CM | POA: Diagnosis not present

## 2015-09-20 DIAGNOSIS — R2689 Other abnormalities of gait and mobility: Secondary | ICD-10-CM | POA: Diagnosis not present

## 2015-09-20 DIAGNOSIS — J441 Chronic obstructive pulmonary disease with (acute) exacerbation: Secondary | ICD-10-CM | POA: Diagnosis not present

## 2015-09-20 DIAGNOSIS — R791 Abnormal coagulation profile: Secondary | ICD-10-CM | POA: Diagnosis not present

## 2015-09-20 DIAGNOSIS — I4891 Unspecified atrial fibrillation: Secondary | ICD-10-CM | POA: Diagnosis not present

## 2015-09-20 DIAGNOSIS — R278 Other lack of coordination: Secondary | ICD-10-CM | POA: Diagnosis not present

## 2015-09-20 DIAGNOSIS — R296 Repeated falls: Secondary | ICD-10-CM | POA: Diagnosis not present

## 2015-09-20 DIAGNOSIS — R531 Weakness: Secondary | ICD-10-CM | POA: Diagnosis not present

## 2015-09-20 DIAGNOSIS — R270 Ataxia, unspecified: Secondary | ICD-10-CM | POA: Diagnosis not present

## 2015-09-20 DIAGNOSIS — I482 Chronic atrial fibrillation: Secondary | ICD-10-CM | POA: Diagnosis not present

## 2015-09-20 DIAGNOSIS — R29898 Other symptoms and signs involving the musculoskeletal system: Secondary | ICD-10-CM | POA: Diagnosis not present

## 2015-09-20 DIAGNOSIS — D508 Other iron deficiency anemias: Secondary | ICD-10-CM | POA: Diagnosis not present

## 2015-09-20 DIAGNOSIS — I509 Heart failure, unspecified: Secondary | ICD-10-CM | POA: Diagnosis not present

## 2015-09-20 DIAGNOSIS — E785 Hyperlipidemia, unspecified: Secondary | ICD-10-CM | POA: Diagnosis not present

## 2015-09-20 DIAGNOSIS — K5900 Constipation, unspecified: Secondary | ICD-10-CM | POA: Diagnosis not present

## 2015-09-20 DIAGNOSIS — M6281 Muscle weakness (generalized): Secondary | ICD-10-CM | POA: Diagnosis not present

## 2015-09-20 DIAGNOSIS — M4806 Spinal stenosis, lumbar region: Secondary | ICD-10-CM | POA: Diagnosis not present

## 2015-09-20 DIAGNOSIS — M4802 Spinal stenosis, cervical region: Secondary | ICD-10-CM | POA: Diagnosis not present

## 2015-09-20 LAB — GLUCOSE, CAPILLARY
Glucose-Capillary: 145 mg/dL — ABNORMAL HIGH (ref 65–99)
Glucose-Capillary: 179 mg/dL — ABNORMAL HIGH (ref 65–99)
Glucose-Capillary: 245 mg/dL — ABNORMAL HIGH (ref 65–99)

## 2015-09-20 LAB — SYPHILIS: RPR W/REFLEX TO RPR TITER AND TREPONEMAL ANTIBODIES, TRADITIONAL SCREENING AND DIAGNOSIS ALGORITHM: RPR Ser Ql: NONREACTIVE

## 2015-09-20 LAB — CBC
HCT: 43.9 % (ref 39.0–52.0)
Hemoglobin: 14.1 g/dL (ref 13.0–17.0)
MCH: 29 pg (ref 26.0–34.0)
MCHC: 32.1 g/dL (ref 30.0–36.0)
MCV: 90.1 fL (ref 78.0–100.0)
Platelets: 170 10*3/uL (ref 150–400)
RBC: 4.87 MIL/uL (ref 4.22–5.81)
RDW: 13.1 % (ref 11.5–15.5)
WBC: 6.8 10*3/uL (ref 4.0–10.5)

## 2015-09-20 LAB — PROTIME-INR
INR: 1.77 — ABNORMAL HIGH (ref 0.00–1.49)
Prothrombin Time: 20.6 seconds — ABNORMAL HIGH (ref 11.6–15.2)

## 2015-09-20 LAB — CBC AND DIFFERENTIAL: WBC: 6.8 10^3/mL

## 2015-09-20 MED ORDER — METOPROLOL SUCCINATE ER 25 MG PO TB24: 25.0000 mg | ORAL_TABLET | Freq: Every day | ORAL | Status: DC

## 2015-09-20 MED ORDER — METOPROLOL SUCCINATE ER 25 MG PO TB24
25.0000 mg | ORAL_TABLET | Freq: Every day | ORAL | Status: DC
  Administered 2015-09-20: 25 mg via ORAL
  Filled 2015-09-20: qty 1

## 2015-09-20 MED ORDER — METFORMIN HCL 500 MG PO TABS: 500.0000 mg | ORAL_TABLET | Freq: Two times a day (BID) | ORAL | Status: DC

## 2015-09-20 MED ORDER — ASPIRIN EC 81 MG PO TBEC: 81.0000 mg | DELAYED_RELEASE_TABLET | Freq: Every day | ORAL | Status: DC

## 2015-09-20 MED ORDER — METFORMIN HCL 500 MG PO TABS
500.0000 mg | ORAL_TABLET | Freq: Two times a day (BID) | ORAL | Status: DC
  Administered 2015-09-20: 500 mg via ORAL
  Filled 2015-09-20: qty 1

## 2015-09-20 MED ORDER — ALBUTEROL SULFATE (2.5 MG/3ML) 0.083% IN NEBU: 2.5000 mg | INHALATION_SOLUTION | Freq: Four times a day (QID) | RESPIRATORY_TRACT | Status: DC | PRN

## 2015-09-20 MED ORDER — ATORVASTATIN CALCIUM 40 MG PO TABS: 40.0000 mg | ORAL_TABLET | Freq: Every day | ORAL | Status: DC

## 2015-09-20 MED ORDER — PREDNISONE 50 MG PO TABS: 50.0000 mg | ORAL_TABLET | Freq: Every day | ORAL | Status: DC

## 2015-09-20 MED ORDER — DILTIAZEM HCL ER COATED BEADS 120 MG PO CP24: 120.0000 mg | ORAL_CAPSULE | Freq: Every day | ORAL | Status: DC

## 2015-09-20 MED ORDER — WARFARIN SODIUM 10 MG PO TABS
10.0000 mg | ORAL_TABLET | Freq: Once | ORAL | Status: AC
  Administered 2015-09-20: 10 mg via ORAL
  Filled 2015-09-20: qty 1

## 2015-09-20 NOTE — Progress Notes (Signed)
Attempted to call report to [facility] three times without success of reaching the nurse.

## 2015-09-20 NOTE — Progress Notes (Signed)
Family Medicine Teaching Service Daily Progress Note Intern Pager: [phone]  Patient name: Matthew Edwards Medical record number: [id number] Date of birth: [birth year] Age: 75 y.o. Gender: male  Primary Care Provider: Donnamae Jude, MD Consultants: neurology Code Status: full  Pt Overview and Major Events to Date:  4/19: admitted with due recurrent fall, LE weakness  Assessment and Plan: Matthew Edwards is a 75 y.o. male presenting with recurrent falls and weakness. PMH is significant for a.fib, HLD, HTN, CAD, CHF, aortic valve replacement, Type II DM, OSA, and COPD.   Recurrent Falls/Weakness: improved. Motor 5/5 in LE bilaterally. 4/5 in UE bilaterally. Ambulated with nurse in the room.  Localized weakness to LE & history of bowel incontinence concerning for spinal etiology. Cervical MRI with multilevel degenerative change in C-spine causing spinal and foraminal stenosis at multiple levels, most severe at C4-5 & C5-6. Lumbar MRI with mild stenosis at L2-3, mod stenosis at L3-4 &L4-5. Patient with normal rectal tone on exam. Bilateral knee film with mild OA. Dysarthria & aphasia concerning for CVA but CT head and MRI negative except for atrophy & chronic microvascular ischemic change. Arrythmia is also possible given history of Afib but weakness is persistent. EKG significant for Afib with RVR. UDS and EtOH negative. Vit B12 low normal. TSH normal. No leukocytosis or signs of infection. - Outpatient ortho referral for spinal stenosis.  - Appreciate neuro recs. - Lipitor. ASCVD score >35. Insufficient data on ASA in his age per USPTF. - cardiac monitor - PT-HHPT - SW for SNF. Patient lives by himself .  - s/p Vit B12 injection 1 mg - f/u RPR - MMSE 20/30-suggestive for mild congenitive impairment. (recall and attention/calculation)  Atrial fibrillation: EKG in ED with afib. CHAD-Vasc 5. On warfarin at home but INR 1.37 on presentation. Not on rate of rhythm controller at home. INR 1.77 -  Diltiazem CD 120 mg q24hrs - Add metoprolol XL 25 mg daily - Continue home warfarin per pharmacy  CHF/AVR (bioprostetic valve): no crackles or edema on exam. CXR without pulmonary congestion. Last echo in 3/16 with EF 50-55% & PAPP of 58. On Lasix and benazepril at home.  - Continue home benazepril - Continue home lasix  HTN: On benazepril at home.  - continue home benazepril  Type II DM: A1c 7.5 in 07/2015. On glipizide at home. CBG stable  - discharged on metformin and glipizide - SSI thin here  COPD: improved. No wheezing bilaterally - Prednisone 50 mg daily (4/20>,  - No increased sputum production to suggest infection. So no antibiotics. Matthew Edwards (on Symbicort at home) - Albuterol as needed - O2 sat in 90s on RA.   HLD: lipid panel 145/181/39/70 in 07/2015 - Continue home atorvastatin  FEN/GI:  heart healthy/carb modified diet  Prophylaxis: home warfarin  Disposition: pending SNF placement.   Subjective:  Says he is feeling "great". Says he is ready for ballet dancing. Weakness in his legs is better. Says it takes him few seconds to get going after getting up. He lives alone.   Objective: Temp:  [97.5 F (36.4 C)-98.2 F (36.8 C)] 98.1 F (36.7 C) (04/21 0015) Pulse Rate:  [31-128] 100 (04/21 0015) Resp:  [18-27] 20 (04/21 0015) BP: (94-135)/(59-110) 129/74 mmHg (04/21 0015) SpO2:  [88 %-99 %] 98 % (04/21 0015) Physical Exam: Gen: appears obese Neck: supple, no LAD CV: regular rate and rythm. S1 & S2 audible, no murmurs. Resp: no apparent work of breathing, no wheeze, no crackles GI: bowel sounds  normal, no tenderness to palpation, no rebound or guarding, no mass.  GU: no suprapubic tenderness Skin: no lesion Neuro:  Mental status: alert, awake, oriented Speech: normal Cranial nerve: CN II-XII grossly intact  Motor: 5/5 in LE bilaterally, 4/5 in UEs bilaterally. Difficulty with fine motor (thumb to fingers) Sensory: light touch. Proprioception intact.    Laboratory:  Recent Labs Lab 09/18/15 1203 09/19/15 0331  WBC 7.6 7.5  HGB 15.1 14.5  HCT 46.4 45.3  PLT 132* 142*    Recent Labs Lab 09/18/15 1203 09/19/15 0331  NA 137 136  K 4.3 4.4  CL 101 100*  CO2 25 26  BUN 13 10  CREATININE 1.26* 0.98  CALCIUM 9.9 9.6  GLUCOSE 180* 217*    Imaging/Diagnostic Tests: Dg Knee Bilateral Standing Ap  09/19/2015  CLINICAL DATA:  Leg weakness.  History of falls. EXAM: BILATERAL KNEES STANDING - 1 VIEW COMPARISON:  None. FINDINGS: Single frontal view of both knees demonstrates mild medial compartment joint space narrowing, right greater than left. No evidence of acute fracture on single view. Cannot assess for joint effusion. Vascular calcifications are seen. IMPRESSION: Single frontal view of both knees demonstrates mild osteoarthritis. If there is clinical concern for acute injury, completion views recommended. Electronically Signed   By: Jeb Levering M.D.   On: 09/19/2015 21:49   Mr Brain Wo Contrast  09/19/2015  CLINICAL DATA:  Confusion and falls.  Ataxia. EXAM: MRI HEAD WITHOUT CONTRAST TECHNIQUE: Multiplanar, multiecho pulse sequences of the brain and surrounding structures were obtained without intravenous contrast. COMPARISON:  CT head 09/18/2015.  MRI 05/18/2015 FINDINGS: Mild to moderate atrophy. Negative for acute infarct. Mild to moderate chronic microvascular ischemic changes in the cerebral white matter similar to the prior study. Mild chronic ischemic change in the pons unchanged. Negative for intracranial hemorrhage. Negative for mass or edema. No fluid collection and no midline shift. Paranasal sinuses clear. Normal orbit. Normal pituitary and skullbase. IMPRESSION: Atrophy and chronic microvascular ischemic change. No acute abnormality No change from 05/18/2015 MRI Electronically Signed   By: Franchot Gallo M.D.   On: 09/19/2015 21:00   Mr Cervical Spine W Wo Contrast  09/19/2015  CLINICAL DATA:  Multiple falls.  Ataxia.  EXAM: MRI CERVICAL SPINE WITHOUT AND WITH CONTRAST TECHNIQUE: Multiplanar and multiecho pulse sequences of the cervical spine, to include the craniocervical junction and cervicothoracic junction, were obtained according to standard protocol without and with intravenous contrast. CONTRAST:  9mL MULTIHANCE GADOBENATE DIMEGLUMINE 529 MG/ML IV SOLN COMPARISON:  None. FINDINGS: Image quality degraded by significant motion. Postcontrast images are nondiagnostic due to motion. Normal cervical alignment. Straightening of the cervical lordosis. Negative for fracture or mass lesion. Spinal cord not well evaluated due to motion but grossly normal in signal. C2-3:  Mild disc degeneration C3-4: Disc degeneration and spondylosis. Diffuse uncinate spurring with mild spinal stenosis and moderate foraminal stenosis bilaterally. C4-5: Disc degeneration and spondylosis. Central disc protrusion indenting the cord and causing moderate spinal stenosis. Severe foraminal encroachment bilaterally due to spurring. C5-6: Disc degeneration and spondylosis. Moderate spinal stenosis and moderate foraminal stenosis bilaterally. C6-7: Disc degeneration and spondylosis. Mild spinal and foraminal stenosis bilaterally C7-T1:  Negative IMPRESSION: Image quality degraded by extensive motion. Multilevel degenerative change in the cervical spine causing spinal and foraminal stenosis at multiple levels. Spinal stenosis most severe at C4-5 and C5-6. Electronically Signed   By: Franchot Gallo M.D.   On: 09/19/2015 21:19   Mr Lumbar Spine W Wo Contrast  09/19/2015  CLINICAL  DATA:  Multiple falls with gait ataxia. Bowel incontinence. EXAM: MRI LUMBAR SPINE WITHOUT AND WITH CONTRAST TECHNIQUE: Multiplanar and multiecho pulse sequences of the lumbar spine were obtained without and with intravenous contrast. CONTRAST:  46mL MULTIHANCE GADOBENATE DIMEGLUMINE 529 MG/ML IV SOLN COMPARISON:  None. FINDINGS: Image quality degraded by mild motion however this is a  better quality study than that seen in the cervical spine were there was extensive motion. Normal lumbar alignment. Negative for fracture or mass lesion. S1 is partially lumbarized. Conus medullaris is normal and terminates at L1-2. Enhancement in the inferior L3 Schmorl's node. No enhancing mass lesion. L1-2:  Negative L2-3:  Mild facet degeneration and mild spinal stenosis L3-4: Schmorl's node in the inferior endplate of L3 with edema and enhancement. Diffuse disc bulging. Bilateral facet hypertrophy. Moderate spinal stenosis. L4-5: Mild disc bulging. Moderate facet degeneration. Moderate spinal stenosis L5-S1: Mild disc degeneration. Moderate facet degeneration. 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 IMPRESSION: Congenital stenosis.  Acquired stenosis at multiple levels. Mild spinal stenosis at L2-3 Moderate spinal stenosis at L3-4 and L4-5. Electronically Signed   By: Franchot Gallo M.D.   On: 09/19/2015 21:31    Mercy Riding, MD 09/20/2015, 1:50 AM PGY-1, [place] Intern pager: [phone], text pages welcome

## 2015-09-20 NOTE — Care Management Note (Addendum)
Case Management Note  Patient Details  Name: Matthew Edwards MRN: [id number] Date of Birth: 04-22-1941  Subjective/Objective:    Pt admitted for Weakness and multiple falls. Pt is from home alone and has support of daughter. CM did speak with daughter in reference to Aides and the out of pocket cost. CM did make her aware that skilled services will be covered if needed.                 Action/Plan: Daughter wants to re-explore SNF. CM did call Elie[facility] Lo[facility] to make her aware and she is to call the daughter in regards. CM did state the plan is for d/c today. CM will continue to monitor.    Expected Discharge Date:                  Expected Discharge Plan:  [place]  In-House Referral:  Clinical Social Work  Discharge planning Services  CM Consult  Post Acute Care Choice:  Home Health Choice offered to:  Patient, Adult Children  DME Arranged:  N/A DME Agency:  NA  HH Arranged:   N/A HH Agency:   N/A  Status of Service:  Completed.  Medicare Important Message Given:    Date Medicare IM Given:    Medicare IM give by:    Date Additional Medicare IM Given:    Additional Medicare Important Message give by:     If discussed at [place] of Stay Meetings, dates discussed:    Additional Comments: Pt and daughter agreeable to SNF and CSW will assist with disposition needs.   Bethena Roys, RN 09/20/2015, 2:51 PM

## 2015-09-20 NOTE — Progress Notes (Signed)
ANTICOAGULATION CONSULT NOTE - Follow Up Consult  Pharmacy Consult:  Coumadin Indication: atrial fibrillation  Allergies  Allergen Reactions  . Pineapple Concentrate Nausea And Vomiting    Patient Measurements: Height: 5\' 8"  (172.7 cm) Weight: 248 lb 4.8 oz (112.628 kg) IBW/kg (Calculated) : 68.4  Vital Signs: Temp: 98.3 F (36.8 C) (04/21 0355) Temp Source: Oral (04/21 0355) BP: 131/68 mmHg (04/21 0355) Pulse Rate: 110 (04/21 0355)  Labs:  Recent Labs  09/18/15 1203 09/19/15 0331 09/20/15 0409  HGB 15.1 14.5 14.1  HCT 46.4 45.3 43.9  PLT 132* 142* 170  LABPROT 17.0* 17.8* 20.6*  INR 1.37 1.46 1.77*  CREATININE 1.26* 0.98  --     Estimated Creatinine Clearance: 83 mL/min (by C-G formula based on Cr of 0.98).     Assessment: 72 YOM presented after sustaining at least 4 falls in the last 2 weeks.  Patient was on Coumadin from PTA for history of Afib and to continue here (head CT negative for bleed).  Patient's INR is sub-therapeutic but trending up.  No bleeding reported.   Goal of Therapy:  INR 2-3    Plan:  - Coumadin 10mg  PO today - Daily PT / INR - F/U long-term AC plan given fall risk, tapering steroid    Carrick Rijos D. Mina Marble, PharmD, BCPS Pager:  [phone] 09/20/2015, 7:39 AM

## 2015-09-20 NOTE — Progress Notes (Signed)
Pt and dtr now agreeable to SNF- they want [facility] can accept today  Craig Staggers received [id number]  Patient will discharge to [facility] Anticipated discharge date:4/21 Family notified: pt dtr Transportation by pt dtr- will take pt to [facility] when paperwork is done (facility representative to come to facility at 4:15pm to complete)  CSW signing off.  Domenica Reamer, [place] Social Worker [phone]

## 2015-09-20 NOTE — Discharge Instructions (Addendum)
It has been a pleasure taking care of you! You were admitted due to fall and weakness in your legs. After the studies we have done, we think this is due to spinal stenosis (see below). We are pleased that your symptoms have improved. However, we recommend follow up with orthopedics as outpatient. We will relay this information to your primary care doctor so that he/she can facilitate the referral.  We have added some medication for your other medical conditions, particularly your diabetes and atrial fibrillation (irregular heart beat). We have also made some adjustments to your home medications. Please, make sure to read the directions before you take them. The names and directions on how to take these medications are found on this discharge paper under medication section.  You also need a follow up with your primary care doctor. The address, date and time are found on the discharge paper under follow up section.  Take care,  Spinal Stenosis Spinal stenosis is when the open spaces between the bones of your spine (vertebrae) get smaller (narrow). It is caused by bone pushing on the open spaces of your spine. This puts pressure on your spine and the nerves in your spine. You may be given medicine to lessen the puffiness (inflammation) in your nerves. Other times, surgery is needed. HOME CARE  Change positions when you sit, stand, and lie. This can help take pressure off your nerves.  Do exercises as told by your doctor to strengthen the middle part of your body.  Lose weight if your doctor suggests it. This takes pressure off your spine.  Take all medicine as told by your doctor. MAKE SURE YOU:  Understand these instructions.  Will watch your condition.  Will get help right away if you are not doing well or get worse.   This information is not intended to replace advice given to you by your health care provider. Make sure you discuss any questions you have with your health care provider.     Document Released: 09/11/2010 Document Revised: 01/18/2013 Document Reviewed: 08/26/2012 Elsevier Interactive Patient Education [employer].  Stroke Prevention Some health problems and behaviors may make it more likely for you to have a stroke. Below are ways to lessen your risk of having a stroke.   Be active for at least 30 minutes on most or all days.  Do not smoke. Try not to be around others who smoke.  Do not drink too much alcohol.  Do not have more than 2 drinks a day if you are a man.  Do not have more than 1 drink a day if you are a woman and are not pregnant.  Eat healthy foods, such as fruits and vegetables. If you were put on a specific diet, follow the diet as told.  Keep your cholesterol levels under control through diet and medicines. Look for foods that are low in saturated fat, trans fat, cholesterol, and are high in fiber.  If you have diabetes, follow all diet plans and take your medicine as told.  Ask your doctor if you need treatment to lower your blood pressure. If you have high blood pressure (hypertension), follow all diet plans and take your medicine as told by your doctor.  If you are 61-23 years old, have your blood pressure checked every 3-5 years. If you are age 81 or older, have your blood pressure checked every year.  Keep a healthy weight. Eat foods that are low in calories, salt, saturated fat, trans fat,  and cholesterol.  Do not take drugs.  Avoid birth control pills, if this applies. Talk to your doctor about the risks of taking birth control pills.  Talk to your doctor if you have sleep problems (sleep apnea).  Take all medicine as told by your doctor.  You may be told to take aspirin or blood thinner medicine. Take this medicine as told by your doctor.  Understand your medicine instructions.  Make sure any other conditions you have are being taken care of. GET HELP RIGHT AWAY IF:  You suddenly lose feeling (you feel numb) or  have weakness in your face, arm, or leg.  Your face or eyelid hangs down to one side.  You suddenly feel confused.  You have trouble talking (aphasia) or understanding what people are saying.  You suddenly have trouble seeing in one or both eyes.  You suddenly have trouble walking.  You are dizzy.  You lose your balance or your movements are clumsy (uncoordinated).  You suddenly have a very bad headache and you do not know the cause.  You have new chest pain.  Your heart feels like it is fluttering or skipping a beat (irregular heartbeat). Do not wait to see if the symptoms above go away. Get help right away. Call your local emergency services (911 in [place] Do not drive yourself to the hospital.   This information is not intended to replace advice given to you by your health care provider. Make sure you discuss any questions you have with your health care provider.   Document Released: 11/17/2011 Document Revised: 06/08/2014 Document Reviewed: 11/18/2012 Elsevier Interactive Patient Education [employer].

## 2015-09-20 NOTE — Progress Notes (Signed)
Physical Therapy Treatment Patient Details Name: Matthew Edwards MRN: [id number] DOB: [birth year] Today's Date: 09/20/2015    History of Present Illness 75 y.o. male admitted to [facility] on 09/18/15 for weakness and recurrent falls.  Head CT was negative, but further medical, cardiac and neuro workup in progress.  On 09/19/15 pt found to be in a-fib with rates in the 120s-130s.  Pt with significant PMHx of A-fib, HTN, CAD, s/p AVR, CHF, COPD, DM2, coronary artery stent, and CABG.      PT Comments    Pt performed increased activity but remains unsteady.  Pt has numerous recent falls.  Pt's daughter spoke with PTA and concerned for lack of supervision and assistance at home.  Pt would benefit from [place] rehab, will inform supervising PT of change in recommendations.    Follow Up Recommendations  SNF    Equipment Recommendations  None recommended by PT    Recommendations for Other Services       Precautions / Restrictions Precautions Precautions: Fall Precaution Comments: pt with 4 falls in the past month.  He thinks his left leg gives out or he gets his feet "hung".  Restrictions Weight Bearing Restrictions: No    Mobility  Bed Mobility               General bed mobility comments: Pt seated EOB   Transfers Overall transfer level: Needs assistance Equipment used: Straight cane Transfers: Sit to/from Stand Sit to Stand: Supervision         General transfer comment: supervision for safety verbal cues for safe hand placement.   Ambulation/Gait Ambulation/Gait assistance: Min guard Ambulation Distance (Feet): 110 Feet Assistive device: Straight cane Gait Pattern/deviations: Step-through pattern;Staggering right;Staggering left;Wide base of support Gait velocity: decreased   General Gait Details: Pt remains unsteady with gait, no true LOB noted.  Pt remains to require significant cueing with straight cane to maintain safety with sequencing.     Stairs             Wheelchair Mobility    Modified Rankin (Stroke Patients Only)       Balance     Sitting balance-Leahy Scale: Good       Standing balance-Leahy Scale: Poor                      Cognition Arousal/Alertness: Awake/alert Behavior During Therapy: WFL for tasks assessed/performed Overall Cognitive Status: No family/caregiver present to determine baseline cognitive functioning Area of Impairment: Orientation;Safety/judgement Orientation Level: Time;Situation   Memory: Decreased short-term memory   Safety/Judgement: Decreased awareness of safety;Decreased awareness of deficits     General Comments: Pt deferring questions and decisions to daughter, who is not present.    Exercises      General Comments        Pertinent Vitals/Pain Pain Assessment: No/denies pain    Home Living Family/patient expects to be discharged to:: Private residence Living Arrangements: Alone Available Help at Discharge: Family;Available PRN/intermittently Type of Home: Apartment Home Access: Stairs to enter Entrance Stairs-Rails: None Home Layout: One level Home Equipment: Grab bars - tub/shower;Walker - 2 wheels;Cane - single point;Bedside commode      Prior Function Level of Independence: Independent with assistive device(s)      Comments: uses a cane at all times.    PT Goals (current goals can now be found in the care plan section) Acute Rehab PT Goals Patient Stated Goal: to stop falling so much Potential to Achieve Goals: Good Progress towards PT  goals: Progressing toward goals    Frequency  Min 3X/week    PT Plan      Co-evaluation             End of Session Equipment Utilized During Treatment: Gait belt Activity Tolerance: Patient tolerated treatment well Patient left: in bed;with bed alarm set (seated EOB)     Time: [id number] PT Time Calculation (min) (ACUTE ONLY): 27 min  Charges:  $Gait Training: 23-37 mins                    G Codes:       Cristela Blue Oct 09, 2015, 4:53 PM  Governor Rooks, PTA pager [phone]

## 2015-09-20 NOTE — Evaluation (Signed)
Occupational Therapy Evaluation Patient Details Name: Matthew Edwards MRN: [id number] DOB: 10-03-1940 Today's Date: 09/20/2015    History of Present Illness 75 y.o. male admitted to [facility] on 09/18/15 for weakness and recurrent falls.  Head CT was negative, but further medical, cardiac and neuro workup in progress.  On 09/19/15 pt found to be in a-fib with rates in the 120s-130s.  Pt with significant PMHx of A-fib, HTN, CAD, s/p AVR, CHF, COPD, DM2, coronary artery stent, and CABG.     Clinical Impression   Pt polite, but not cooperative with answering OT's questions or demonstrating ADL skills in the absence of his daughter. Recommending HHOT to address home safety with ADL and IADL and to make DME recommendations. Reinforced use of walker at home. Pt with poor insight.    Follow Up Recommendations  Home health OT    Equipment Recommendations   (defer to [facility])    Recommendations for Other Services       Precautions / Restrictions Precautions Precautions: Fall Precaution Comments: pt with 4 falls in the past month.  He thinks his left leg gives out or he gets his feet "hung".  Restrictions Weight Bearing Restrictions: No      Mobility Bed Mobility               General bed mobility comments: Pt seated EOB   Transfers Overall transfer level: Needs assistance Equipment used: Rolling walker (2 wheeled) Transfers: Sit to/from Stand Sit to Stand: Supervision         General transfer comment: supervision for safety verbal cues for safe hand placement.     Balance     Sitting balance-Leahy Scale: Good       Standing balance-Leahy Scale: Poor                              ADL                                         General ADL Comments: Difficult to assess thoroughly, pt resistant to demonstrating his skills in the absence of his daughter.     Vision     Perception     Praxis      Pertinent Vitals/Pain Pain Assessment:  No/denies pain     Hand Dominance Right   Extremity/Trunk Assessment Upper Extremity Assessment Upper Extremity Assessment: Generalized weakness   Lower Extremity Assessment Lower Extremity Assessment: Generalized weakness   Cervical / Trunk Assessment Cervical / Trunk Assessment: Normal   Communication Communication Communication: No difficulties   Cognition Arousal/Alertness: Awake/alert Behavior During Therapy: WFL for tasks assessed/performed Overall Cognitive Status: No family/caregiver present to determine baseline cognitive functioning Area of Impairment: Orientation;Safety/judgement Orientation Level: Time;Situation   Memory: Decreased short-term memory   Safety/Judgement: Decreased awareness of safety;Decreased awareness of deficits     General Comments: Pt deferring questions and decisions to daughter, who is not present.   General Comments       Exercises       Shoulder Instructions      Home Living Family/patient expects to be discharged to:: Private residence Living Arrangements: Alone Available Help at Discharge: Family;Available PRN/intermittently Type of Home: Apartment Home Access: Stairs to enter Entrance Stairs-Number of Steps: 1 Entrance Stairs-Rails: None Home Layout: One level     Bathroom Shower/Tub: Corporate investment banker:  Standard     Home Equipment: Grab bars - tub/shower;Walker - 2 wheels;Cane - single point;Bedside commode      Lives With: Alone    Prior Functioning/Environment Level of Independence: Independent with assistive device(s)        Comments: uses a cane at all times.     OT Diagnosis: Generalized weakness;Cognitive deficits   OT Problem List:     OT Treatment/Interventions:      OT Goals(Current goals can be found in the care plan section) Acute Rehab OT Goals Patient Stated Goal: to stop falling so much  OT Frequency:     Barriers to D/C:            Co-evaluation               End of Session Equipment Utilized During Treatment: Gait belt;Rolling walker  Activity Tolerance: Patient tolerated treatment well Patient left: in bed;with call bell/phone within reach;with family/visitor present   Time: 1357-1420 OT Time Calculation (min): 23 min Charges:  OT General Charges $OT Visit: 1 Procedure OT Evaluation $OT Eval Moderate Complexity: 1 Procedure OT Treatments $Self Care/Home Management : 8-22 mins G-Codes: OT G-codes **NOT FOR INPATIENT CLASS** Functional Assessment Tool Used: clinical judgement Functional Limitation: Self care Self Care Current Status [id number]): At least 20 percent but less than 40 percent impaired, limited or restricted Self Care Goal Status [id number]): At least 20 percent but less than 40 percent impaired, limited or restricted Self Care Discharge Status [phone]): At least 20 percent but less than 40 percent impaired, limited or restricted  Malka So 09/20/2015, 2:32 PM  [phone]

## 2015-09-20 NOTE — NC FL2 (Signed)
[place] LEVEL OF CARE SCREENING TOOL     IDENTIFICATION  Patient Name: Matthew Edwards Birthdate: 05-05-1941 Sex: male Admission Date (Current Location): 09/18/2015  [facility] and [place] Number:  Herbalist and Address:  [facility], [place] [address], [place], [place] [address]      Provider Number: [id number]  Attending Physician Name and Address:  Lupita Dawn, MD  Relative Name and Phone Number:       Current Level of Care: Hospital Recommended Level of Care: [place] Prior Approval Number:    Date Approved/Denied:   PASRR Number:    Discharge Plan: SNF    Current Diagnoses: Patient Active Problem List   Diagnosis Date Noted  . Atrial fibrillation ([place])   . Fall   . Lower extremity weakness   . Type 2 diabetes mellitus with complication, without long-term current use of insulin ([place])   . Weakness 09/18/2015  . Constipation 01/28/2015  . OSA (obstructive sleep apnea) 11/13/2014  . Pulmonary HTN ([place]) 08/24/2014  . Chronic anticoagulation   . Chronic atrial fibrillation ([place])   . Diabetes mellitus type 2 with complications ([place])   . Acute on chronic congestive heart failure with left ventricular diastolic dysfunction ([place]) 08/02/2014  . Cognitive impairment 07/02/2014  . Altered mental status 06/28/2014  . Abdominal distention 06/28/2014  . Unspecified constipation 12/13/2013  . S/P AVR 12/29/2012  . Diabetic neuropathy ([place]) 11/25/2011  . Excessive tearing 10/06/2011  . Diabetes mellitus ([place]) 08/06/2010  . BENIGN POSITIONAL VERTIGO 06/14/2010  . VITAMIN D DEFICIENCY 02/21/2010  . Primary hyperparathyroidism ([place]) 06/04/2009  . Hypercalcemia 05/29/2009  . Essential hypertension 02/23/2007  . DEPRESSION 12/15/2006  . Coronary atherosclerosis 12/15/2006  . GERD 12/15/2006  . COPD 12/10/2006  . HLD (hyperlipidemia) 07/29/2006  . Alcohol abuse 07/29/2006  . Aortic valve disorder 07/29/2006     Orientation RESPIRATION BLADDER Height & Weight     Self, Time, Situation, Place  Normal Continent Weight: 248 lb 4.8 oz (112.628 kg) Height:  5\' 8"  (172.7 cm)  BEHAVIORAL SYMPTOMS/MOOD NEUROLOGICAL BOWEL NUTRITION STATUS      Continent Diet (carb modified)  AMBULATORY STATUS COMMUNICATION OF NEEDS Skin   Limited Assist   Normal                       Personal Care Assistance Level of Assistance  Bathing, Dressing Bathing Assistance: Limited assistance   Dressing Assistance: Limited assistance     Functional Limitations Info             SPECIAL CARE FACTORS FREQUENCY  PT (By licensed PT), OT (By licensed OT)     PT Frequency: 5/wk OT Frequency: 5/wk            Contractures      Additional Factors Info  Code Status, Insulin Sliding Scale, Allergies Code Status Info: FULL Allergies Info: Pineapple Concentrate   Insulin Sliding Scale Info: 4/day       Current Medications (09/20/2015):  This is the current hospital active medication list Current Facility-Administered Medications  Medication Dose Route Frequency Provider Last Rate Last Dose  .  stroke: mapping our early stages of recovery book   Does not apply Once Verner Mould, MD      . albuterol (PROVENTIL) (2.5 MG/3ML) 0.083% nebulizer solution 2.5 mg  2.5 mg Nebulization Q6H PRN Lupita Dawn, MD      . atorvastatin (LIPITOR) tablet 40 mg  40 mg Oral q1800 Nicolette Bang, DO   40 mg at 09/19/15 1801  . benazepril (LOTENSIN) tablet 20 mg  20 mg Oral Daily Mercy Riding, MD   20 mg at 09/20/15 0855  . cholecalciferol (VITAMIN D) tablet 1,000 Units  1,000 Units Oral Daily Verner Mould, MD   1,000 Units at 09/20/15 [phone]  . cyanocobalamin ((VITAMIN B-12)) injection 1,000 mcg  1,000 mcg Intramuscular Once Mercy Riding, MD   1,000 mcg at 09/19/15 1300  . diltiazem (CARDIZEM CD) 24 hr capsule 120 mg  120 mg Oral Daily Mercy Riding, MD   120 mg at 09/20/15 0855  . furosemide  (LASIX) tablet 40 mg  40 mg Oral BID Verner Mould, MD   40 mg at 09/20/15 0855  . glipiZIDE (GLUCOTROL) tablet 10 mg  10 mg Oral Q supper Verner Mould, MD   10 mg at 09/19/15 1801  . insulin aspart (novoLOG) injection 0-5 Units  0-5 Units Subcutaneous QHS Mercy Riding, MD   0 Units at 09/19/15 2242  . insulin aspart (novoLOG) injection 0-9 Units  0-9 Units Subcutaneous TID WC Mercy Riding, MD   2 Units at 09/20/15 1153  . metFORMIN (GLUCOPHAGE) tablet 500 mg  500 mg Oral BID WC Nicolette Bang, DO      . metoprolol succinate (TOPROL-XL) 24 hr tablet 25 mg  25 mg Oral Daily Lupita Dawn, MD   25 mg at 09/20/15 1152  . mometasone-formoterol (DULERA) 100-5 MCG/ACT inhaler 2 puff  2 puff Inhalation BID Verner Mould, MD   2 puff at 09/18/15 2103  . polycarbophil (FIBERCON) tablet 625 mg  625 mg Oral Daily Verner Mould, MD   625 mg at 09/20/15 0856  . potassium chloride SA (K-DUR,KLOR-CON) CR tablet 20 mEq  20 mEq Oral Daily Verner Mould, MD   20 mEq at 09/20/15 0855  . predniSONE (DELTASONE) tablet 50 mg  50 mg Oral Q breakfast Lupita Dawn, MD   50 mg at 09/20/15 0855  . warfarin (COUMADIN) tablet 10 mg  10 mg Oral ONCE-1800 Tyrone Apple, RPH      . Warfarin - Pharmacist Dosing Inpatient   Does not apply [place], [facility]         Discharge Medications: Please see discharge summary for a list of discharge medications.  Relevant Imaging Results:  Relevant Lab Results:   Additional Information SS: [id number]  Cranford Mon, [place]

## 2015-09-20 NOTE — Progress Notes (Signed)
CSW consulted for SNF placement- CSW met with pt and spoke with pt dtr over the phone.  Pt and pt dtr do not want SNF placement and would like for pt to return home with home health services like he has done in the past.  Pt is interested in looking at aid to come in and help clean a few times a week.  RNCM informed that pt is not for SNF placement- MD paged to inform pt and dtr want to return home  CSW signing off  Domenica Reamer, [place] Social Worker [phone]

## 2015-09-20 NOTE — Discharge Summary (Signed)
[facility] Discharge Summary  Patient name: Matthew Edwards Medical record number: [id number] Date of birth: [birth year] Age: 75 y.o. Gender: male Date of Admission: 09/18/2015  Date of Discharge: 09/20/2015 Admitting Physician: Lupita Dawn, MD  Primary Care Provider: Donnamae Jude, MD Consultants: Neurology   Indication for Hospitalization: Recurrent Falls/Weakness Concerning for CVA   Discharge Diagnoses/Problem List:  Patient Active Problem List   Diagnosis Date Noted  . Atrial fibrillation ([place])   . Fall   . Lower extremity weakness   . Type 2 diabetes mellitus with complication, without long-term current use of insulin ([place])   . Weakness 09/18/2015  . Constipation 01/28/2015  . OSA (obstructive sleep apnea) 11/13/2014  . Pulmonary HTN ([place]) 08/24/2014  . Chronic anticoagulation   . Chronic atrial fibrillation ([place])   . Diabetes mellitus type 2 with complications ([place])   . Acute on chronic congestive heart failure with left ventricular diastolic dysfunction ([place]) 08/02/2014  . Cognitive impairment 07/02/2014  . Altered mental status 06/28/2014  . Abdominal distention 06/28/2014  . Unspecified constipation 12/13/2013  . S/P AVR 12/29/2012  . Diabetic neuropathy ([place]) 11/25/2011  . Excessive tearing 10/06/2011  . Diabetes mellitus ([place]) 08/06/2010  . BENIGN POSITIONAL VERTIGO 06/14/2010  . VITAMIN D DEFICIENCY 02/21/2010  . Primary hyperparathyroidism ([place]) 06/04/2009  . Hypercalcemia 05/29/2009  . Essential hypertension 02/23/2007  . DEPRESSION 12/15/2006  . Coronary atherosclerosis 12/15/2006  . GERD 12/15/2006  . COPD 12/10/2006  . HLD (hyperlipidemia) 07/29/2006  . Alcohol abuse 07/29/2006  . Aortic valve disorder 07/29/2006    Disposition: SNF   Discharge Condition: Stable   Discharge Exam:  Gen: appears obese Neck: supple, no LAD CV: regular rate and rythm. S1 & S2 audible, no murmurs. Resp: no apparent work of breathing,  no wheeze, no crackles GI: bowel sounds normal, no tenderness to palpation, no rebound or guarding, no mass.  GU: no suprapubic tenderness Skin: no lesion Neuro:  Mental status: alert, awake, oriented Speech: normal Cranial nerve: CN II-XII grossly intact  Motor: 5/5 in LE bilaterally, 4/5 in UEs bilaterally. Difficulty with fine motor (thumb to fingers) Sensory: light touch. Proprioception intact.   Brief Hospital Course:  Matthew Edwards is a 75 y.o. male with PMH significant for a.fib, HLD, HTN, CAD, CHF, aortic valve replacement, Type II DM, OSA, and COPD who presented with recurrent falls and weakness, as well as reported bowel incontinence, concerning for CVA vs. Spinal etiology. Patient is on warfarin at home for anticoagulation due to Afib. CT head showed chronic small vessel ischemic changes but no acute abnormalities. Neurology was consulted.   MRI brain was ordered, as well as a cervical and lumbar spine MRI. MRI was negative except for atrophy and chronic microvascular ischemic change. UDS and ETOH were negative. TSH and RPR were WNL. B12 was normal but on lower side of the range so an injection of B12 was given during hospitalization. Cervical MRI with multilevel degenerative change in C-spine causing spinal and foraminal stenosis at multiple levels, most severe at C4-5 & C5-6. Lumbar MRI with mild stenosis at L2-3, mod stenosis at L3-4 &L4-5. DRE revealed normal rectal tone.     Issues for Follow Up:  1. Patient needs outpatient orthopedics referral for spinal stenosis.  2. MMSE 20/30 suggestive for mild cognitive impairment  3. Patient with history of COPD with increased wheezing noted during hospitalization. Was started on 5 day course of Prednisone on 4/20.  4. Patient may need EMG/NCV as outpatient  per neurology recommendations.   Significant Procedures: None   Significant Labs and Imaging:   Recent Labs Lab 09/18/15 1203 09/19/15 0331 09/20/15 0409  WBC 7.6 7.5  6.8  HGB 15.1 14.5 14.1  HCT 46.4 45.3 43.9  PLT 132* 142* 170    Recent Labs Lab 09/18/15 1203 09/19/15 0331  NA 137 136  K 4.3 4.4  CL 101 100*  CO2 25 26  GLUCOSE 180* 217*  BUN 13 10  CREATININE 1.26* 0.98  CALCIUM 9.9 9.6    Dg Chest 2 View  09/18/2015  CLINICAL DATA:  Atrial fibrillation history of coronary artery disease. EXAM: CHEST  2 VIEW COMPARISON:  08/02/2014. FINDINGS: Stable surgical changes from bypass surgery. The heart is mildly enlarged but stable. There is tortuosity and calcification of the thoracic aorta. A prosthetic aortic valve is noted. The lungs are clear of acute process. No pleural effusion. The bony thorax is intact. IMPRESSION: No acute cardiopulmonary findings. Electronically Signed   By: Rudie Meyer M.D.   On: 09/18/2015 12:32   Dg Knee Bilateral Standing Ap  09/19/2015  CLINICAL DATA:  Leg weakness.  History of falls. EXAM: BILATERAL KNEES STANDING - 1 VIEW COMPARISON:  None. FINDINGS: Single frontal view of both knees demonstrates mild medial compartment joint space narrowing, right greater than left. No evidence of acute fracture on single view. Cannot assess for joint effusion. Vascular calcifications are seen. IMPRESSION: Single frontal view of both knees demonstrates mild osteoarthritis. If there is clinical concern for acute injury, completion views recommended. Electronically Signed   By: Rubye Oaks M.D.   On: 09/19/2015 21:49   Ct Head Wo Contrast  09/18/2015  CLINICAL DATA:  Weakness, confusion, multiple falls EXAM: CT HEAD WITHOUT CONTRAST TECHNIQUE: Contiguous axial images were obtained from the base of the skull through the vertex without intravenous contrast. COMPARISON:  MRI brain dated 05/18/2015 FINDINGS: No evidence of parenchymal hemorrhage or extra-axial fluid collection. No mass lesion, mass effect, or midline shift. No CT evidence of acute infarction. Subcortical white matter and periventricular small vessel ischemic changes.  Global cortical atrophy.  No ventriculomegaly. The visualized paranasal sinuses are essentially clear. The mastoid air cells are unopacified. No evidence of calvarial fracture. IMPRESSION: No evidence of acute intracranial abnormality. Atrophy with small vessel ischemic changes. Electronically Signed   By: Charline Bills M.D.   On: 09/18/2015 13:56   Mr Brain Wo Contrast  09/19/2015  CLINICAL DATA:  Confusion and falls.  Ataxia. EXAM: MRI HEAD WITHOUT CONTRAST TECHNIQUE: Multiplanar, multiecho pulse sequences of the brain and surrounding structures were obtained without intravenous contrast. COMPARISON:  CT head 09/18/2015.  MRI 05/18/2015 FINDINGS: Mild to moderate atrophy. Negative for acute infarct. Mild to moderate chronic microvascular ischemic changes in the cerebral white matter similar to the prior study. Mild chronic ischemic change in the pons unchanged. Negative for intracranial hemorrhage. Negative for mass or edema. No fluid collection and no midline shift. Paranasal sinuses clear. Normal orbit. Normal pituitary and skullbase. IMPRESSION: Atrophy and chronic microvascular ischemic change. No acute abnormality No change from 05/18/2015 MRI Electronically Signed   By: Marlan Palau M.D.   On: 09/19/2015 21:00   Mr Cervical Spine W Wo Contrast  09/19/2015  CLINICAL DATA:  Multiple falls.  Ataxia. EXAM: MRI CERVICAL SPINE WITHOUT AND WITH CONTRAST TECHNIQUE: Multiplanar and multiecho pulse sequences of the cervical spine, to include the craniocervical junction and cervicothoracic junction, were obtained according to standard protocol without and with intravenous contrast. CONTRAST:  55mL  MULTIHANCE GADOBENATE DIMEGLUMINE 529 MG/ML IV SOLN COMPARISON:  None. FINDINGS: Image quality degraded by significant motion. Postcontrast images are nondiagnostic due to motion. Normal cervical alignment. Straightening of the cervical lordosis. Negative for fracture or mass lesion. Spinal cord not well evaluated  due to motion but grossly normal in signal. C2-3:  Mild disc degeneration C3-4: Disc degeneration and spondylosis. Diffuse uncinate spurring with mild spinal stenosis and moderate foraminal stenosis bilaterally. C4-5: Disc degeneration and spondylosis. Central disc protrusion indenting the cord and causing moderate spinal stenosis. Severe foraminal encroachment bilaterally due to spurring. C5-6: Disc degeneration and spondylosis. Moderate spinal stenosis and moderate foraminal stenosis bilaterally. C6-7: Disc degeneration and spondylosis. Mild spinal and foraminal stenosis bilaterally C7-T1:  Negative IMPRESSION: Image quality degraded by extensive motion. Multilevel degenerative change in the cervical spine causing spinal and foraminal stenosis at multiple levels. Spinal stenosis most severe at C4-5 and C5-6. Electronically Signed   By: Franchot Gallo M.D.   On: 09/19/2015 21:19   Mr Lumbar Spine W Wo Contrast  09/19/2015  CLINICAL DATA:  Multiple falls with gait ataxia. Bowel incontinence. EXAM: MRI LUMBAR SPINE WITHOUT AND WITH CONTRAST TECHNIQUE: Multiplanar and multiecho pulse sequences of the lumbar spine were obtained without and with intravenous contrast. CONTRAST:  74m MULTIHANCE GADOBENATE DIMEGLUMINE 529 MG/ML IV SOLN COMPARISON:  None. FINDINGS: Image quality degraded by mild motion however this is a better quality study than that seen in the cervical spine were there was extensive motion. Normal lumbar alignment. Negative for fracture or mass lesion. S1 is partially lumbarized. Conus medullaris is normal and terminates at L1-2. Enhancement in the inferior L3 Schmorl's node. No enhancing mass lesion. L1-2:  Negative L2-3:  Mild facet degeneration and mild spinal stenosis L3-4: Schmorl's node in the inferior endplate of L3 with edema and enhancement. Diffuse disc bulging. Bilateral facet hypertrophy. Moderate spinal stenosis. L4-5: Mild disc bulging. Moderate facet degeneration. Moderate spinal  stenosis L5-S1: Mild disc degeneration. Moderate facet degeneration. 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 IMPRESSION: Congenital stenosis.  Acquired stenosis at multiple levels. Mild spinal stenosis at L2-3 Moderate spinal stenosis at L3-4 and L4-5. Electronically Signed   By: CFranchot GalloM.D.   On: 09/19/2015 21:31    Results/Tests Pending at Time of Discharge: None   Discharge Medications:    Medication List    TAKE these medications        ACCU-CHEK AVIVA PLUS w/Device Kit  1 Units by Does not apply route 3 (three) times daily.     albuterol (2.5 MG/3ML) 0.083% nebulizer solution  Commonly known as:  PROVENTIL  Take 3 mLs (2.5 mg total) by nebulization every 6 (six) hours as needed for shortness of breath.     atorvastatin 40 MG tablet  Commonly known as:  LIPITOR  Take 1 tablet (40 mg total) by mouth daily at 6 PM.     benazepril 20 MG tablet  Commonly known as:  LOTENSIN  Take 1 tablet (20 mg total) by mouth daily.     budesonide-formoterol 80-4.5 MCG/ACT inhaler  Commonly known as:  SYMBICORT  Inhale 2 puffs into the lungs 2 (two) times daily.     cholecalciferol 1000 units tablet  Commonly known as:  VITAMIN D  Take 1 tablet (1,000 Units total) by mouth daily.     diltiazem 120 MG 24 hr capsule  Commonly known as:  CARDIZEM CD  Take 1 capsule (120 mg total)  by mouth daily.     furosemide 40 MG tablet  Commonly known as:  LASIX  Take 1 tablet (40 mg total) by mouth 2 (two) times daily.     glipiZIDE 10 MG tablet  Commonly known as:  GLUCOTROL  Take medication at 5 PM with a meal.     glucose blood test strip  Commonly known as:  ACCU-CHEK AVIVA PLUS  USE TO CHECK BLOOD SUGARS FOUR TIMES DAILY     metFORMIN 500 MG tablet  Commonly known as:  GLUCOPHAGE  Take 1 tablet (500 mg total) by mouth 2 (two) times daily with a meal.     metoprolol succinate 25 MG 24 hr tablet   Commonly known as:  TOPROL-XL  Take 1 tablet (25 mg total) by mouth daily.     nitroGLYCERIN 0.4 MG SL tablet  Commonly known as:  NITROSTAT  Place 1 tablet (0.4 mg total) under the tongue every 5 (five) minutes as needed. For chest pain     polycarbophil 625 MG tablet  Commonly known as:  FIBERCON  Take 1 tablet (625 mg total) by mouth daily.     potassium chloride SA 20 MEQ tablet  Commonly known as:  K-DUR,KLOR-CON  Take 1 tablet (20 mEq total) by mouth daily.     predniSONE 50 MG tablet  Commonly known as:  DELTASONE  Take 1 tablet (50 mg total) by mouth daily with breakfast. Start on 09/21/2015     vitamin C 500 MG tablet  Commonly known as:  ASCORBIC ACID  Take 1 tablet (500 mg total) by mouth every morning.     warfarin 5 MG tablet  Commonly known as:  COUMADIN  Take 1 tablet (5 mg total) by mouth as directed.        Discharge Instructions: Please refer to Patient Instructions section of EMR for full details.  Patient was counseled important signs and symptoms that should prompt return to medical care, changes in medications, dietary instructions, activity restrictions, and follow up appointments.   Follow-Up Appointments: Follow-up Information    Follow up with [facility] .   Specialty:  [place] information:   [address] [place], DO 09/20/2015, 4:14 PM PGY-1, [place]

## 2015-09-23 ENCOUNTER — Encounter: Payer: Self-pay | Admitting: Internal Medicine

## 2015-09-23 ENCOUNTER — Non-Acute Institutional Stay (SKILLED_NURSING_FACILITY): Payer: Commercial Managed Care - HMO | Admitting: Internal Medicine

## 2015-09-23 DIAGNOSIS — M4802 Spinal stenosis, cervical region: Secondary | ICD-10-CM

## 2015-09-23 DIAGNOSIS — M4806 Spinal stenosis, lumbar region: Secondary | ICD-10-CM

## 2015-09-23 DIAGNOSIS — R270 Ataxia, unspecified: Secondary | ICD-10-CM | POA: Diagnosis not present

## 2015-09-23 DIAGNOSIS — I5032 Chronic diastolic (congestive) heart failure: Secondary | ICD-10-CM | POA: Diagnosis not present

## 2015-09-23 DIAGNOSIS — E118 Type 2 diabetes mellitus with unspecified complications: Secondary | ICD-10-CM | POA: Diagnosis not present

## 2015-09-23 DIAGNOSIS — K5900 Constipation, unspecified: Secondary | ICD-10-CM

## 2015-09-23 DIAGNOSIS — I482 Chronic atrial fibrillation, unspecified: Secondary | ICD-10-CM

## 2015-09-23 DIAGNOSIS — Z794 Long term (current) use of insulin: Secondary | ICD-10-CM

## 2015-09-23 DIAGNOSIS — I10 Essential (primary) hypertension: Secondary | ICD-10-CM

## 2015-09-23 DIAGNOSIS — J441 Chronic obstructive pulmonary disease with (acute) exacerbation: Secondary | ICD-10-CM

## 2015-09-23 DIAGNOSIS — R4189 Other symptoms and signs involving cognitive functions and awareness: Secondary | ICD-10-CM

## 2015-09-23 DIAGNOSIS — R531 Weakness: Secondary | ICD-10-CM

## 2015-09-23 DIAGNOSIS — E785 Hyperlipidemia, unspecified: Secondary | ICD-10-CM | POA: Diagnosis not present

## 2015-09-23 DIAGNOSIS — M48061 Spinal stenosis, lumbar region without neurogenic claudication: Secondary | ICD-10-CM

## 2015-09-23 NOTE — Progress Notes (Signed)
LOCATION: [facility]  PCP: Donnamae Jude, MD   Code Status: Full Code  Goals of care: Advanced Directive information Advanced Directives 09/18/2015  Does patient have an advance directive? Yes  Type of Advance Directive [place]  Would patient like information on creating an advanced directive? -       Extended Emergency Contact Information Primary Emergency Contact: Carriere,[name] Address: [address]          [place], [place] [address] Johnnette Litter of [place] Phone: [phone] Relation: Daughter Secondary Emergency Contact: Rochel Brome [place] Mobile Phone: [phone] Relation: Son   Allergies  Allergen Reactions  . Pineapple Concentrate Nausea And Vomiting    Chief Complaint  Patient presents with  . New Admit To SNF    New Admission     HPI:  Patient is a 75 y.o. male seen today for short term rehabilitation post hospital admission from 09/18/15-09/20/15 with recurrent falls and concern for acute CVA. Brain imaging ruled out acute intracranial abnormality. CT and MRI of cervical and lumbar spine showed c4-5 and c5-6 severe stenosis and L2-3 mild stenosis with L3-4 and L4-5 moderate stenosis. There was concern for copd exacerbation with his wheezing and he was started on prednisone. He was seen by neurology with gait ataxia and neuropathy and EMG/ NCV were recommended. Outpatient orthopedic and neurology referral was recommended for spinal stenosis and cognitive impairment. He has PMH of afib, HLD, HTN, CHF, AVR, type 2 DM, COPD among others.   Review of Systems:  Constitutional: Negative for fever, chills and diaphoresis. Energy level is slowly coming back.  HENT: Negative for headache, congestion, nasal discharge, sore throat, difficulty swallowing.   Eyes: Negative for blurred vision, double vision and discharge. Wears glasses.  Respiratory: Negative for shortness of breath and wheezing. Positive for dry cough.     Cardiovascular: Negative for chest pain, palpitations, leg swelling.  Gastrointestinal: Negative for heartburn, nausea, vomiting, abdominal pain, loss of appetite, melena, diarrhea. Last bowel movement was a week ago. Passing gas. Positive for constipation.  Genitourinary: Negative for dysuria and flank pain.  Musculoskeletal: negative for back pain. No fall in the facility.  Skin: Negative for itching, rash.  Neurological: Negative for dizziness. Psychiatric/Behavioral: Negative for depression   Past Medical History  Diagnosis Date  . Hyperlipidemia   . Hypertension   . CAD (coronary artery disease) 2012    [place]-LAD  . GERD (gastroesophageal reflux disease)   . S/P AVR     #25 mm Edwards pericardial valve Bioprosthetic. Dr Cyndia Bent.  . Atrial fibrillation ([place])   . HEART FAILURE, CONGESTIVE UNSPEC 02/23/2007    Qualifier: Diagnosis of  By: Mellody Drown MD, [facility]    . TOBACCO DEPENDENCE 07/29/2006    Qualifier: History of  By: Carlena Sax  MD, [facility]    . COPD (chronic obstructive pulmonary disease) ([place])   . Type II diabetes mellitus ([place])   . Situational depression     "son passed 11/2013"  . OSA (obstructive sleep apnea) 11/13/2014  . Stented coronary artery 2013   Past Surgical History  Procedure Laterality Date  . Aortic valve replacement  07/2010    notes 07/28/2010  . Cardiac valve replacement    . Coronary artery bypass graft  07/2010    "1"/notes 07/28/2010  . Coronary angioplasty with stent placement  2006; 10/2006    "2"; 1/notes 10/01/2010  . Cardiac catheterization  06/2010    Archie Endo 07/01/2010   Social History:  reports that he quit smoking about 11 years ago. His smoking use included Cigarettes. He has a 23 pack-year smoking history. He has never used smokeless tobacco. He reports that he does not drink alcohol or use illicit drugs.  Family History  Problem Relation Age of Onset  . Heart disease Mother     Medications:   Medication List       This list is accurate  as of: 09/23/15  2:52 PM.  Always use your most recent med list.               ACCU-CHEK AVIVA PLUS w/Device Kit  1 Units by Does not apply route 3 (three) times daily.     albuterol (2.5 MG/3ML) 0.083% nebulizer solution  Commonly known as:  PROVENTIL  Take 3 mLs (2.5 mg total) by nebulization every 6 (six) hours as needed for shortness of breath.     atorvastatin 40 MG tablet  Commonly known as:  LIPITOR  Take 1 tablet (40 mg total) by mouth daily at 6 PM.     benazepril 20 MG tablet  Commonly known as:  LOTENSIN  Take 1 tablet (20 mg total) by mouth daily.     budesonide-formoterol 80-4.5 MCG/ACT inhaler  Commonly known as:  SYMBICORT  Inhale 2 puffs into the lungs 2 (two) times daily.     cholecalciferol 1000 units tablet  Commonly known as:  VITAMIN D  Take 1 tablet (1,000 Units total) by mouth daily.     diltiazem 120 MG 24 hr capsule  Commonly known as:  CARDIZEM CD  Take 1 capsule (120 mg total) by mouth daily.     furosemide 40 MG tablet  Commonly known as:  LASIX  Take 1 tablet (40 mg total) by mouth 2 (two) times daily.     glipiZIDE 10 MG tablet  Commonly known as:  GLUCOTROL  Take medication at 5 PM with a meal.     glucose blood test strip  Commonly known as:  ACCU-CHEK AVIVA PLUS  USE TO CHECK BLOOD SUGARS FOUR TIMES DAILY     metFORMIN 500 MG tablet  Commonly known as:  GLUCOPHAGE  Take 1 tablet (500 mg total) by mouth 2 (two) times daily with a meal.     metoprolol succinate 25 MG 24 hr tablet  Commonly known as:  TOPROL-XL  Take 1 tablet (25 mg total) by mouth daily.     nitroGLYCERIN 0.4 MG SL tablet  Commonly known as:  NITROSTAT  Place 1 tablet (0.4 mg total) under the tongue every 5 (five) minutes as needed. For chest pain     polycarbophil 625 MG tablet  Commonly known as:  FIBERCON  Take 1 tablet (625 mg total) by mouth daily.     potassium chloride SA 20 MEQ tablet  Commonly known as:  K-DUR,KLOR-CON  Take 1 tablet (20 mEq total)  by mouth daily.     predniSONE 50 MG tablet  Commonly known as:  DELTASONE  Take 1 tablet (50 mg total) by mouth daily with breakfast. Start on 09/21/2015     vitamin C 500 MG tablet  Commonly known as:  ASCORBIC ACID  Take 1 tablet (500 mg total) by mouth every morning.     warfarin 5 MG tablet  Commonly known as:  COUMADIN  Take 5 mg by mouth daily.        Immunizations: Immunization History  Administered Date(s) Administered  . Influenza Whole 02/23/2007, 03/07/2008, 03/01/2009, 02/28/2010  . Influenza,inj,Quad  PF,36+ Mos 02/09/2014  . Influenza-Unspecified 03/01/2012, 01/30/2013, 03/31/2015  . PPD Test 09/20/2015  . Pneumococcal Conjugate-13 08/01/2010  . Pneumococcal Polysaccharide-23 06/24/2011  . Tdap 06/24/2011     Physical Exam: Filed Vitals:   09/23/15 1441  BP: 130/76  Pulse: 88  Temp: 97.5 F (36.4 C)  TempSrc: Oral  Resp: 20  Height: '5\' 8"'$  (1.727 m)  Weight: 248 lb 4.8 oz (112.628 kg)  SpO2: 97%   Body mass index is 37.76 kg/(m^2).  General- elderly male, morbidly obese, in no acute distress Head- normocephalic, atraumatic Nose- no maxillary or frontal sinus tenderness, no nasal discharge Throat- moist mucus membrane  Eyes- PERRLA, EOMI, no pallor, no icterus, no discharge, normal conjunctiva, normal sclera Neck- no cervical lymphadenopathy Cardiovascular- normal s1,s2, no murmur, trace leg edema Respiratory- bilateral clear to auscultation, no wheeze, no rhonchi, no crackles, no use of accessory muscles Abdomen- bowel sounds present, soft, non tender but distended Musculoskeletal- able to move all 4 extremities, generalized weakness more in LE Neurological-  alert and oriented to person, place and time Skin- warm and dry Psychiatry- normal mood and affect    Labs reviewed: Basic Metabolic Panel:  Recent Labs  05/18/15 1142 09/18/15 1203 09/19/15 09/19/15 0331  NA 137 137 136*  236* 136  K 4.7 4.3  --  4.4  CL 103 101  --  100*  CO2  26 25  --  26  GLUCOSE 83 180*  --  217*  BUN 24* '13 10 10  '$ CREATININE 1.51* 1.26* 1.0 0.98  CALCIUM 10.2 9.9  --  9.6   Liver Function Tests:  Recent Labs  12/16/14 1447  AST 24  ALT 15*  ALKPHOS 65  BILITOT 0.9  PROT 7.7  ALBUMIN 4.3   No results for input(s): LIPASE, AMYLASE in the last 8760 hours. No results for input(s): AMMONIA in the last 8760 hours. CBC:  Recent Labs  12/16/14 1447  09/18/15 1203 09/19/15 0331 09/20/15 09/20/15 0409  WBC 7.2  < > 7.6 7.5 6.8 6.8  NEUTROABS 4.8  --   --   --   --   --   HGB 14.5  < > 15.1 14.5  --  14.1  HCT 46.2  < > 46.4 45.3  --  43.9  MCV 92.8  < > 90.8 90.4  --  90.1  PLT 130*  < > 132* 142*  --  170  < > = values in this interval not displayed. Cardiac Enzymes:  Recent Labs  12/16/14 1447  TROPONINI <0.03   BNP: Invalid input(s): POCBNP CBG:  Recent Labs  09/20/15 0718 09/20/15 1140 09/20/15 1627  GLUCAP 145* 179* 245*    Radiological Exams: Dg Chest 2 View  09/18/2015  CLINICAL DATA:  Atrial fibrillation history of coronary artery disease. EXAM: CHEST  2 VIEW COMPARISON:  08/02/2014. FINDINGS: Stable surgical changes from bypass surgery. The heart is mildly enlarged but stable. There is tortuosity and calcification of the thoracic aorta. A prosthetic aortic valve is noted. The lungs are clear of acute process. No pleural effusion. The bony thorax is intact. IMPRESSION: No acute cardiopulmonary findings. Electronically Signed   By: Marijo Sanes M.D.   On: 09/18/2015 12:32   Dg Knee Bilateral Standing Ap  09/19/2015  CLINICAL DATA:  Leg weakness.  History of falls. EXAM: BILATERAL KNEES STANDING - 1 VIEW COMPARISON:  None. FINDINGS: Single frontal view of both knees demonstrates mild medial compartment joint space narrowing, right greater than left. No evidence  of acute fracture on single view. Cannot assess for joint effusion. Vascular calcifications are seen. IMPRESSION: Single frontal view of both knees  demonstrates mild osteoarthritis. If there is clinical concern for acute injury, completion views recommended. Electronically Signed   By: Rubye Oaks M.D.   On: 09/19/2015 21:49   Ct Head Wo Contrast  09/18/2015  CLINICAL DATA:  Weakness, confusion, multiple falls EXAM: CT HEAD WITHOUT CONTRAST TECHNIQUE: Contiguous axial images were obtained from the base of the skull through the vertex without intravenous contrast. COMPARISON:  MRI brain dated 05/18/2015 FINDINGS: No evidence of parenchymal hemorrhage or extra-axial fluid collection. No mass lesion, mass effect, or midline shift. No CT evidence of acute infarction. Subcortical white matter and periventricular small vessel ischemic changes. Global cortical atrophy.  No ventriculomegaly. The visualized paranasal sinuses are essentially clear. The mastoid air cells are unopacified. No evidence of calvarial fracture. IMPRESSION: No evidence of acute intracranial abnormality. Atrophy with small vessel ischemic changes. Electronically Signed   By: Charline Bills M.D.   On: 09/18/2015 13:56   Mr Brain Wo Contrast  09/19/2015  CLINICAL DATA:  Confusion and falls.  Ataxia. EXAM: MRI HEAD WITHOUT CONTRAST TECHNIQUE: Multiplanar, multiecho pulse sequences of the brain and surrounding structures were obtained without intravenous contrast. COMPARISON:  CT head 09/18/2015.  MRI 05/18/2015 FINDINGS: Mild to moderate atrophy. Negative for acute infarct. Mild to moderate chronic microvascular ischemic changes in the cerebral white matter similar to the prior study. Mild chronic ischemic change in the pons unchanged. Negative for intracranial hemorrhage. Negative for mass or edema. No fluid collection and no midline shift. Paranasal sinuses clear. Normal orbit. Normal pituitary and skullbase. IMPRESSION: Atrophy and chronic microvascular ischemic change. No acute abnormality No change from 05/18/2015 MRI Electronically Signed   By: Marlan Palau M.D.   On:  09/19/2015 21:00   Mr Cervical Spine W Wo Contrast  09/19/2015  CLINICAL DATA:  Multiple falls.  Ataxia. EXAM: MRI CERVICAL SPINE WITHOUT AND WITH CONTRAST TECHNIQUE: Multiplanar and multiecho pulse sequences of the cervical spine, to include the craniocervical junction and cervicothoracic junction, were obtained according to standard protocol without and with intravenous contrast. CONTRAST:  43mL MULTIHANCE GADOBENATE DIMEGLUMINE 529 MG/ML IV SOLN COMPARISON:  None. FINDINGS: Image quality degraded by significant motion. Postcontrast images are nondiagnostic due to motion. Normal cervical alignment. Straightening of the cervical lordosis. Negative for fracture or mass lesion. Spinal cord not well evaluated due to motion but grossly normal in signal. C2-3:  Mild disc degeneration C3-4: Disc degeneration and spondylosis. Diffuse uncinate spurring with mild spinal stenosis and moderate foraminal stenosis bilaterally. C4-5: Disc degeneration and spondylosis. Central disc protrusion indenting the cord and causing moderate spinal stenosis. Severe foraminal encroachment bilaterally due to spurring. C5-6: Disc degeneration and spondylosis. Moderate spinal stenosis and moderate foraminal stenosis bilaterally. C6-7: Disc degeneration and spondylosis. Mild spinal and foraminal stenosis bilaterally C7-T1:  Negative IMPRESSION: Image quality degraded by extensive motion. Multilevel degenerative change in the cervical spine causing spinal and foraminal stenosis at multiple levels. Spinal stenosis most severe at C4-5 and C5-6. Electronically Signed   By: Marlan Palau M.D.   On: 09/19/2015 21:19   Mr Lumbar Spine W Wo Contrast  09/19/2015  CLINICAL DATA:  Multiple falls with gait ataxia. Bowel incontinence. EXAM: MRI LUMBAR SPINE WITHOUT AND WITH CONTRAST TECHNIQUE: Multiplanar and multiecho pulse sequences of the lumbar spine were obtained without and with intravenous contrast. CONTRAST:  76mL MULTIHANCE GADOBENATE  DIMEGLUMINE 529 MG/ML IV SOLN COMPARISON:  None.  FINDINGS: Image quality degraded by mild motion however this is a better quality study than that seen in the cervical spine were there was extensive motion. Normal lumbar alignment. Negative for fracture or mass lesion. S1 is partially lumbarized. Conus medullaris is normal and terminates at L1-2. Enhancement in the inferior L3 Schmorl's node. No enhancing mass lesion. L1-2:  Negative L2-3:  Mild facet degeneration and mild spinal stenosis L3-4: Schmorl's node in the inferior endplate of L3 with edema and enhancement. Diffuse disc bulging. Bilateral facet hypertrophy. Moderate spinal stenosis. L4-5: Mild disc bulging. Moderate facet degeneration. Moderate spinal stenosis L5-S1: Mild disc degeneration. Moderate facet degeneration. 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 IMPRESSION: Congenital stenosis.  Acquired stenosis at multiple levels. Mild spinal stenosis at L2-3 Moderate spinal stenosis at L3-4 and L4-5. Electronically Signed   By: Franchot Gallo M.D.   On: 09/19/2015 21:31    Assessment/Plan  Generalized weakness Will have him work with physical therapy and occupational therapy team to help with gait training and muscle strengthening exercises.fall precautions. Skin care. Encourage to be out of bed.   Gait ataxia With neuropathy. Has DM that could contribute to neuropathy. Pending outpatient neurology follow up for nerve conduction study and orthopedic follow up with concern for stenosis contributing to this  Cognitive impairment Noted on MMSE in hospital, get neurology consult to evaluate further  Cervical spinal stenosis Severe stenosis note don imaging. Get orthopedic referral to evaluate more  Lumbar spinal stenosis With possible myelopathy, get orthopedic referral to evaluate further  Copd exacerbation Improved breathing. Continue his bronchodilator  therapy and complete course of prednisone after today's course  Constipation Start senna s 2 tab qhs and miralax daily and monitor,m hydration encouraged  HTN Stable bp reading. Continue benazepril 20 mg daily and metoprolol with lasix, check bp  afib Rate controlled. Continue diltiazem and toprol xl for rate control. Continue warfarin for anticoagulation. inr today 2.3. Recheck inr 09/26/15 and continue coumadin 5 mg daily for now  CHF Appears euvolemic, continue benazepril, metoprolol and lasix, check daily weight and bmp  DM Lab Results  Component Value Date   HGBA1C 7.5 07/25/2015   Monitor cbg bid, continue metformin and glipizide  HLD Continue atorvastatin. Check ck and lft      Goals of care: short term rehabilitation   Labs/tests ordered: cbc, cmp, ck 09/25/15  Family/ staff Communication: reviewed care plan with patient and nursing supervisor    Blanchie Serve, MD Internal Medicine [place], [place] [address] Cell Phone (Monday-Friday 8 am - 5 pm): [phone] On Call: [phone] and follow prompts after 5 pm and on weekends Office Phone: [phone] Office Fax: [phone]

## 2015-09-24 LAB — CBC AND DIFFERENTIAL
HCT: 47 % (ref 41–53)
Hemoglobin: 15.1 g/dL (ref 13.5–17.5)
Platelets: 214 K/µL (ref 150–399)
WBC: 8.1 10*3/mL

## 2015-09-24 LAB — BASIC METABOLIC PANEL WITH GFR
BUN: 32 mg/dL — AB (ref 4–21)
Creatinine: 1.2 mg/dL (ref 0.6–1.3)
Glucose: 173 mg/dL
Potassium: 4.3 mmol/L (ref 3.4–5.3)
Sodium: 136 mmol/L — AB (ref 137–147)

## 2015-09-24 LAB — HEPATIC FUNCTION PANEL
ALT: 26 U/L (ref 10–40)
AST: 20 U/L (ref 14–40)
Alkaline Phosphatase: 78 U/L (ref 25–125)
Bilirubin, Total: 0.5 mg/dL

## 2015-09-30 ENCOUNTER — Non-Acute Institutional Stay (SKILLED_NURSING_FACILITY): Payer: Commercial Managed Care - HMO | Admitting: Family

## 2015-09-30 ENCOUNTER — Encounter: Payer: Self-pay | Admitting: Family

## 2015-09-30 DIAGNOSIS — R4189 Other symptoms and signs involving cognitive functions and awareness: Secondary | ICD-10-CM

## 2015-09-30 DIAGNOSIS — K5901 Slow transit constipation: Secondary | ICD-10-CM

## 2015-09-30 DIAGNOSIS — R531 Weakness: Secondary | ICD-10-CM

## 2015-09-30 DIAGNOSIS — E559 Vitamin D deficiency, unspecified: Secondary | ICD-10-CM | POA: Diagnosis not present

## 2015-09-30 DIAGNOSIS — J449 Chronic obstructive pulmonary disease, unspecified: Secondary | ICD-10-CM

## 2015-09-30 DIAGNOSIS — I10 Essential (primary) hypertension: Secondary | ICD-10-CM

## 2015-09-30 DIAGNOSIS — I482 Chronic atrial fibrillation, unspecified: Secondary | ICD-10-CM

## 2015-09-30 DIAGNOSIS — E118 Type 2 diabetes mellitus with unspecified complications: Secondary | ICD-10-CM

## 2015-09-30 DIAGNOSIS — E1142 Type 2 diabetes mellitus with diabetic polyneuropathy: Secondary | ICD-10-CM

## 2015-09-30 DIAGNOSIS — K219 Gastro-esophageal reflux disease without esophagitis: Secondary | ICD-10-CM

## 2015-09-30 DIAGNOSIS — E785 Hyperlipidemia, unspecified: Secondary | ICD-10-CM | POA: Diagnosis not present

## 2015-09-30 NOTE — Progress Notes (Signed)
Location:  [facility] Room Number: 1204 Place of Service:  SNF [phone])  Provider: Marlowe Sax, FNP-C  PCP: Donnamae Jude, MD Patient Care Team: Donnamae Jude, MD as PCP - General Gaye Pollack, MD (Cardiothoracic Surgery) Peter M Martinique, MD as Attending Physician (Cardiology)  Extended Emergency Contact Information Primary Emergency Contact: Schlink,[name] Address: [address]          [place], [place] [address] Johnnette Litter of [place] Phone: [phone] Relation: Daughter Secondary Emergency Contact: [place] of [place] Phone: [phone] Relation: Son  Code Status: Full Code  Goals of care:  Advanced Directive information Advanced Directives 09/18/2015  Does patient have an advance directive? Yes  Type of Advance Directive [place]  Would patient like information on creating an advanced directive? -     Allergies  Allergen Reactions  . Pineapple Concentrate Nausea And Vomiting    Chief Complaint  Patient presents with  . Discharge Note    Discharged from SNF    HPI:  75 y.o. male seen today at [facility] for discharge home.He was here for for short term rehabilitation post hospital admission from 09/18/15-09/20/15 with recurrent falls and concern for acute CVA. Brain imaging ruled out acute intracranial abnormality. CT and MRI of cervical and lumbar spine showed c4-5 and c5-6 severe stenosis and L2-3 mild stenosis with L3-4 and L4-5 moderate stenosis. He was also started on prednisone for copd exacerbation. He was seen by neurology with gait ataxia and neuropathy and EMG/ NCV were recommended. Outpatient orthopedic and neurology referral was recommended for spinal stenosis and cognitive impairment. He has a past medical history of Type 2 DM, HTN, COPD, CHF, Hyperlipidemia, Afib, AVR, constipation, among others. He is seen in his room today. He has completed course of  prednisone. His CGB's in the morning ranges 110's while evening's are in the 130's. He denies any acute issues today. He has worked with PT/OT now stable for discharge home to continue with PT/OT for ROM, exercise, Gait stability and muscle strengthening. He will require a [facility] Nurse for medication management and INR check. Last INR 1.8 (09/30/2015) coumadin increased to 5.5 mg daily next INR due 10/03/2015. He states does not require any DME has daughter's wheelchair.  The facility social worker with arrange for Home health services prior to discharge home.   Past Medical History  Diagnosis Date  . Hyperlipidemia   . Hypertension   . CAD (coronary artery disease) 2012    [place]-LAD  . GERD (gastroesophageal reflux disease)   . S/P AVR     #25 mm Edwards pericardial valve Bioprosthetic. Dr Cyndia Bent.  . Atrial fibrillation ([place])   . HEART FAILURE, CONGESTIVE UNSPEC 02/23/2007    Qualifier: Diagnosis of  By: Mellody Drown MD, [facility]    . TOBACCO DEPENDENCE 07/29/2006    Qualifier: History of  By: Carlena Sax  MD, [facility]    . COPD (chronic obstructive pulmonary disease) ([place])   . Type II diabetes mellitus ([place])   . Situational depression     "son passed 11/2013"  . OSA (obstructive sleep apnea) 11/13/2014  . Stented coronary artery 2013    Past Surgical History  Procedure Laterality Date  . Aortic valve replacement  07/2010    notes 07/28/2010  . Cardiac valve replacement    . Coronary artery bypass graft  07/2010    "1"/notes 07/28/2010  . Coronary angioplasty with stent placement  2006; 10/2006    "  2"; 1/notes 10/01/2010  . Cardiac catheterization  06/2010    /notes 07/01/2010      reports that he quit smoking about 11 years ago. His smoking use included Cigarettes. He has a 23 pack-year smoking history. He has never used smokeless tobacco. He reports that he does not drink alcohol or use illicit drugs. Social History   Social History  . Marital Status: Legally Separated    Spouse Name: N/A  . Number of  Children: 7  . Years of Education: 11   Occupational History  . Retired-[employer]   . [employer]    Social History Main Topics  . Smoking status: Former Smoker -- 0.50 packs/day for 46 years    Types: Cigarettes    Quit date: 07/08/2004  . Smokeless tobacco: Never Used  . Alcohol Use: No  . Drug Use: No  . Sexual Activity: Not on file   Other Topics Concern  . Not on file   Social History Narrative   Health Care POA:    Emergency Contact: Daughter, [name]   End of Life Plan:    Who lives with you: self   Any pets: none   Diet: Patient has a varied diet of protein, starch, and vegetables.   Exercise: Pt has not regular exercise routine.   Seatbelts: Pt reports wearing seatbelt when in vehile.   Nancy Fetter Exposure/Protection:    Hobbies: fishing, tv         Functional Status Survey:    Allergies  Allergen Reactions  . Pineapple Concentrate Nausea And Vomiting    Pertinent  Health Maintenance Due  Topic Date Due  . FOOT EXAM  07/07/2013  . INFLUENZA VACCINE  12/31/2015  . HEMOGLOBIN A1C  01/22/2016  . OPHTHALMOLOGY EXAM  02/26/2016  . COLONOSCOPY  01/30/2018  . PNA vac Low Risk Adult  Completed    Medications:   Medication List       This list is accurate as of: 09/30/15 10:39 AM.  Always use your most recent med list.               ACCU-CHEK AVIVA PLUS w/Device Kit  1 Units by Does not apply route 3 (three) times daily.     albuterol (2.5 MG/3ML) 0.083% nebulizer solution  Commonly known as:  PROVENTIL  Take 3 mLs (2.5 mg total) by nebulization every 6 (six) hours as needed for shortness of breath.     atorvastatin 40 MG tablet  Commonly known as:  LIPITOR  Take 1 tablet (40 mg total) by mouth daily at 6 PM.     benazepril 20 MG tablet  Commonly known as:  LOTENSIN  Take 1 tablet (20 mg total) by mouth daily.     budesonide-formoterol 80-4.5 MCG/ACT inhaler  Commonly known as:  SYMBICORT  Inhale 2 puffs into the lungs 2 (two) times daily.      diltiazem 120 MG 24 hr capsule  Commonly known as:  CARDIZEM CD  Take 1 capsule (120 mg total) by mouth daily.     furosemide 40 MG tablet  Commonly known as:  LASIX  Take 1 tablet (40 mg total) by mouth 2 (two) times daily.     glipiZIDE 10 MG tablet  Commonly known as:  GLUCOTROL  Take medication at 5 PM with a meal.     glucose blood test strip  Commonly known as:  ACCU-CHEK AVIVA PLUS  USE TO CHECK BLOOD SUGARS FOUR TIMES DAILY     metFORMIN 500  MG tablet  Commonly known as:  GLUCOPHAGE  Take 1 tablet (500 mg total) by mouth 2 (two) times daily with a meal.     metoprolol succinate 25 MG 24 hr tablet  Commonly known as:  TOPROL-XL  Take 25 mg by mouth daily. Hold if SBP < 110 or HR 60     nitroGLYCERIN 0.4 MG SL tablet  Commonly known as:  NITROSTAT  Place 1 tablet (0.4 mg total) under the tongue every 5 (five) minutes as needed. For chest pain     polycarbophil 625 MG tablet  Commonly known as:  FIBERCON  Take 1 tablet (625 mg total) by mouth daily.     potassium chloride SA 20 MEQ tablet  Commonly known as:  K-DUR,KLOR-CON  Take 1 tablet (20 mEq total) by mouth daily.     predniSONE 50 MG tablet  Commonly known as:  DELTASONE  Take 1 tablet (50 mg total) by mouth daily with breakfast. Start on 09/21/2015     vitamin C 500 MG tablet  Commonly known as:  ASCORBIC ACID  Take 1 tablet (500 mg total) by mouth every morning.     VITAMIN D-3 PO  Take 1,000 Units by mouth daily.     warfarin 5 MG tablet  Commonly known as:  COUMADIN  Take 5 mg by mouth daily.        Review of Systems  Constitutional: Negative for fever, chills, activity change, appetite change and fatigue.  HENT: Negative for congestion, rhinorrhea, sinus pressure, sneezing and sore throat.   Eyes: Negative.   Respiratory: Negative for cough, chest tightness, shortness of breath and wheezing.   Cardiovascular: Negative for chest pain, palpitations and leg swelling.  Gastrointestinal: Negative  for nausea, vomiting, abdominal pain, diarrhea, blood in stool and abdominal distention.  Endocrine: Negative.   Genitourinary: Negative for dysuria, urgency, frequency and flank pain.  Musculoskeletal: Positive for gait problem.  Skin: Negative.   Neurological: Negative for dizziness, seizures, light-headedness and headaches.       Leg weakness   Psychiatric/Behavioral: Negative for hallucinations, confusion, sleep disturbance and agitation. The patient is not nervous/anxious.     Filed Vitals:   09/30/15 1023  BP: 107/65  Pulse: 73  Temp: 97 F (36.1 C)  Resp: 21  Height: '5\' 8"'$  (1.727 m)  Weight: 246 lb 9.6 oz (111.857 kg)  SpO2: 92%   Body mass index is 37.5 kg/(m^2). Physical Exam  Constitutional: He appears well-developed and well-nourished. No distress.  HENT:  Head: Normocephalic.  Mouth/Throat: Oropharynx is clear and moist.  Eyes: Conjunctivae and EOM are normal. Pupils are equal, round, and reactive to light. Right eye exhibits no discharge. Left eye exhibits no discharge. No scleral icterus.  Neck: Normal range of motion. No JVD present. No thyromegaly present.  Cardiovascular: Normal rate, normal heart sounds and intact distal pulses.  Exam reveals no gallop and no friction rub.   No murmur heard. Pulmonary/Chest: Effort normal and breath sounds normal. No respiratory distress. He has no wheezes. He has no rales.  Abdominal: Soft. Bowel sounds are normal. He exhibits no distension and no mass. There is no tenderness. There is no rebound and no guarding.  Musculoskeletal: Normal range of motion. He exhibits no edema or tenderness.  Bilateral Lower extremities weakness  Lymphadenopathy:    He has no cervical adenopathy.  Neurological: He is alert.  Skin: Skin is warm and dry. No rash noted. No erythema. No pallor.  Psychiatric: He has a normal mood  and affect.    Labs reviewed: Basic Metabolic Panel:  Recent Labs  05/18/15 1142 09/18/15 1203 09/19/15  09/19/15 0331 09/24/15  NA 137 137 136*  236* 136 136*  K 4.7 4.3  --  4.4 4.3  CL 103 101  --  100*  --   CO2 26 25  --  26  --   GLUCOSE 83 180*  --  217*  --   BUN 24* '13 10 10 '$ 32*  CREATININE 1.51* 1.26* 1.0 0.98 1.2  CALCIUM 10.2 9.9  --  9.6  --    Liver Function Tests:  Recent Labs  12/16/14 1447 09/24/15  AST 24 20  ALT 15* 26  ALKPHOS 65 78  BILITOT 0.9  --   PROT 7.7  --   ALBUMIN 4.3  --    No results for input(s): LIPASE, AMYLASE in the last 8760 hours. No results for input(s): AMMONIA in the last 8760 hours. CBC:  Recent Labs  12/16/14 1447  09/18/15 1203 09/19/15 0331 09/20/15 09/20/15 0409 09/24/15  WBC 7.2  < > 7.6 7.5 6.8 6.8 8.1  NEUTROABS 4.8  --   --   --   --   --   --   HGB 14.5  < > 15.1 14.5  --  14.1 15.1  HCT 46.2  < > 46.4 45.3  --  43.9 47  MCV 92.8  < > 90.8 90.4  --  90.1  --   PLT 130*  < > 132* 142*  --  170 214  < > = values in this interval not displayed. Cardiac Enzymes:  Recent Labs  12/16/14 1447  TROPONINI <0.03   BNP: Invalid input(s): POCBNP CBG:  Recent Labs  09/20/15 0718 09/20/15 1140 09/20/15 1627  GLUCAP 145* 179* 245*    Procedures and Imaging Studies During Stay: Dg Chest 2 View  09/18/2015  CLINICAL DATA:  Atrial fibrillation history of coronary artery disease. EXAM: CHEST  2 VIEW COMPARISON:  08/02/2014. FINDINGS: Stable surgical changes from bypass surgery. The heart is mildly enlarged but stable. There is tortuosity and calcification of the thoracic aorta. A prosthetic aortic valve is noted. The lungs are clear of acute process. No pleural effusion. The bony thorax is intact. IMPRESSION: No acute cardiopulmonary findings. Electronically Signed   By: Marijo Sanes M.D.   On: 09/18/2015 12:32   Dg Knee Bilateral Standing Ap  09/19/2015  CLINICAL DATA:  Leg weakness.  History of falls. EXAM: BILATERAL KNEES STANDING - 1 VIEW COMPARISON:  None. FINDINGS: Single frontal view of both knees demonstrates mild  medial compartment joint space narrowing, right greater than left. No evidence of acute fracture on single view. Cannot assess for joint effusion. Vascular calcifications are seen. IMPRESSION: Single frontal view of both knees demonstrates mild osteoarthritis. If there is clinical concern for acute injury, completion views recommended. Electronically Signed   By: Jeb Levering M.D.   On: 09/19/2015 21:49   Ct Head Wo Contrast  09/18/2015  CLINICAL DATA:  Weakness, confusion, multiple falls EXAM: CT HEAD WITHOUT CONTRAST TECHNIQUE: Contiguous axial images were obtained from the base of the skull through the vertex without intravenous contrast. COMPARISON:  MRI brain dated 05/18/2015 FINDINGS: No evidence of parenchymal hemorrhage or extra-axial fluid collection. No mass lesion, mass effect, or midline shift. No CT evidence of acute infarction. Subcortical white matter and periventricular small vessel ischemic changes. Global cortical atrophy.  No ventriculomegaly. The visualized paranasal sinuses are essentially clear. The mastoid air  cells are unopacified. No evidence of calvarial fracture. IMPRESSION: No evidence of acute intracranial abnormality. Atrophy with small vessel ischemic changes. Electronically Signed   By: Charline Bills M.D.   On: 09/18/2015 13:56   Mr Brain Wo Contrast  09/19/2015  CLINICAL DATA:  Confusion and falls.  Ataxia. EXAM: MRI HEAD WITHOUT CONTRAST TECHNIQUE: Multiplanar, multiecho pulse sequences of the brain and surrounding structures were obtained without intravenous contrast. COMPARISON:  CT head 09/18/2015.  MRI 05/18/2015 FINDINGS: Mild to moderate atrophy. Negative for acute infarct. Mild to moderate chronic microvascular ischemic changes in the cerebral white matter similar to the prior study. Mild chronic ischemic change in the pons unchanged. Negative for intracranial hemorrhage. Negative for mass or edema. No fluid collection and no midline shift. Paranasal sinuses  clear. Normal orbit. Normal pituitary and skullbase. IMPRESSION: Atrophy and chronic microvascular ischemic change. No acute abnormality No change from 05/18/2015 MRI Electronically Signed   By: Marlan Palau M.D.   On: 09/19/2015 21:00   Mr Cervical Spine W Wo Contrast  09/19/2015  CLINICAL DATA:  Multiple falls.  Ataxia. EXAM: MRI CERVICAL SPINE WITHOUT AND WITH CONTRAST TECHNIQUE: Multiplanar and multiecho pulse sequences of the cervical spine, to include the craniocervical junction and cervicothoracic junction, were obtained according to standard protocol without and with intravenous contrast. CONTRAST:  51mL MULTIHANCE GADOBENATE DIMEGLUMINE 529 MG/ML IV SOLN COMPARISON:  None. FINDINGS: Image quality degraded by significant motion. Postcontrast images are nondiagnostic due to motion. Normal cervical alignment. Straightening of the cervical lordosis. Negative for fracture or mass lesion. Spinal cord not well evaluated due to motion but grossly normal in signal. C2-3:  Mild disc degeneration C3-4: Disc degeneration and spondylosis. Diffuse uncinate spurring with mild spinal stenosis and moderate foraminal stenosis bilaterally. C4-5: Disc degeneration and spondylosis. Central disc protrusion indenting the cord and causing moderate spinal stenosis. Severe foraminal encroachment bilaterally due to spurring. C5-6: Disc degeneration and spondylosis. Moderate spinal stenosis and moderate foraminal stenosis bilaterally. C6-7: Disc degeneration and spondylosis. Mild spinal and foraminal stenosis bilaterally C7-T1:  Negative IMPRESSION: Image quality degraded by extensive motion. Multilevel degenerative change in the cervical spine causing spinal and foraminal stenosis at multiple levels. Spinal stenosis most severe at C4-5 and C5-6. Electronically Signed   By: Marlan Palau M.D.   On: 09/19/2015 21:19   Mr Lumbar Spine W Wo Contrast  09/19/2015  CLINICAL DATA:  Multiple falls with gait ataxia. Bowel  incontinence. EXAM: MRI LUMBAR SPINE WITHOUT AND WITH CONTRAST TECHNIQUE: Multiplanar and multiecho pulse sequences of the lumbar spine were obtained without and with intravenous contrast. CONTRAST:  42mL MULTIHANCE GADOBENATE DIMEGLUMINE 529 MG/ML IV SOLN COMPARISON:  None. FINDINGS: Image quality degraded by mild motion however this is a better quality study than that seen in the cervical spine were there was extensive motion. Normal lumbar alignment. Negative for fracture or mass lesion. S1 is partially lumbarized. Conus medullaris is normal and terminates at L1-2. Enhancement in the inferior L3 Schmorl's node. No enhancing mass lesion. L1-2:  Negative L2-3:  Mild facet degeneration and mild spinal stenosis L3-4: Schmorl's node in the inferior endplate of L3 with edema and enhancement. Diffuse disc bulging. Bilateral facet hypertrophy. Moderate spinal stenosis. L4-5: Mild disc bulging. Moderate facet degeneration. Moderate spinal stenosis L5-S1: Mild disc degeneration. Moderate facet degeneration. 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 IMPRESSION: Congenital stenosis.  Acquired stenosis at multiple levels. Mild spinal stenosis at L2-3 Moderate spinal stenosis  at L3-4 and L4-5. Electronically Signed   By: Franchot Gallo M.D.   On: 09/19/2015 21:31    Assessment/Plan:   HTN B/p stable. Continue on metoprolol succinate, Benazepril and Diltiazem. PCP to recheck BMP Chronic Afib  Continue on coumadin Last INR 1.8 (09/30/2015) coumadin increased to 5.5 mg daily next INR due 10/03/2015. [facility] Nurse to recheck INR.  COPD  Recent hosp. admission for exacerbation. Has completed a course of prednisone 50 mg Tablet. Continue on Albuterol PRN and Symbicort 80-4.5 mcg/inhaler.  CHF Stable. No weight gain,cough, or rales. Continue on Furosemide 40 mg Tablet. Continue K[place]-Con 20 meg tablet. PCP to recheck BMP.   Type 2 DM CBG's  in the  morning ranges 110's while evening's are in the 130's. Continue on CC diet, Glipizide 10 mg Tablet and Metformin 500 mg Tablet. Monitor Hgb A1C.   GERD Asymptomatic. Continue to monitor.   Constipation Current regimen effective. Continue on fibercon  Neuropathy Status post hospital admission from 09/18/15-09/20/15 with recurrent falls and concern for acute CVA. Brain imaging ruled out acute intracranial abnormality. CT and MRI of cervical and lumbar spine showed c4-5 and c5-6 severe stenosis and L2-3 mild stenosis with L3-4 and L4-5 moderate stenosis. He was seen by neurology with gait ataxia and neuropathy and EMG/ NCV were recommended. Outpatient orthopedic and neurology referral was recommended for spinal stenosis and cognitive impairment.  Vit D deficiency Continue Vit D 1000 units daily.    Hyperlipidemia Continue Atorvastatin 40 mg Tablet daily. Monitor lipid panel.   Generalized weakness S/p hosp admission for fall with recurrent falls and concern for acute CVA. Brain imaging ruled out acute intracranial abnormality. CT and MRI of cervical and lumbar spine showed c4-5 and c5-6 severe stenosis and L2-3 mild stenosis with L3-4 and L4-5 moderate stenosis. Has worked with PT/OT will be discharge home with PT/OT to continue with ROM, gait stability and Muscle strengthening.   Abnormal Gait  Recurrent falls. Continue PT/OT. Fall and safety precautions.  He states does not require any DME has daughter's wheelchair.    Patient is being discharged with the following home health services:   PT/OT for ROM, exercise, Gait stability and muscle strengthening.  [place] Nurse for medication management and INR check. Last INR 1.8 (09/30/2015) coumadin increased to 5.5 mg daily next INR due 10/03/2015.  Patient is being discharged with the following durable medical equipment:  He states does not require any DME has daughter's wheelchair.   Patient has been advised to f/u with their PCP in 1-2 weeks to  bring them up to date on their rehab stay.  Social services at facility was responsible for arranging this appointment.  Pt was provided with a 30 day supply of prescriptions for medications and refills must be obtained from their PCP.  For controlled substances, a more limited supply may be provided adequate until PCP appointment only.  Future labs/tests needed:  CBC, BMP with PCP. INR 10/03/2015

## 2015-10-03 ENCOUNTER — Ambulatory Visit (INDEPENDENT_AMBULATORY_CARE_PROVIDER_SITE_OTHER): Payer: Commercial Managed Care - HMO | Admitting: Cardiology

## 2015-10-03 DIAGNOSIS — R260 Ataxic gait: Secondary | ICD-10-CM | POA: Diagnosis not present

## 2015-10-03 DIAGNOSIS — I359 Nonrheumatic aortic valve disorder, unspecified: Secondary | ICD-10-CM

## 2015-10-03 DIAGNOSIS — E1142 Type 2 diabetes mellitus with diabetic polyneuropathy: Secondary | ICD-10-CM | POA: Diagnosis not present

## 2015-10-03 DIAGNOSIS — I2510 Atherosclerotic heart disease of native coronary artery without angina pectoris: Secondary | ICD-10-CM | POA: Diagnosis not present

## 2015-10-03 DIAGNOSIS — M6281 Muscle weakness (generalized): Secondary | ICD-10-CM | POA: Diagnosis not present

## 2015-10-03 DIAGNOSIS — I10 Essential (primary) hypertension: Secondary | ICD-10-CM | POA: Diagnosis not present

## 2015-10-03 DIAGNOSIS — J449 Chronic obstructive pulmonary disease, unspecified: Secondary | ICD-10-CM | POA: Diagnosis not present

## 2015-10-03 DIAGNOSIS — Z9181 History of falling: Secondary | ICD-10-CM | POA: Diagnosis not present

## 2015-10-03 DIAGNOSIS — I482 Chronic atrial fibrillation: Secondary | ICD-10-CM | POA: Diagnosis not present

## 2015-10-03 DIAGNOSIS — R4189 Other symptoms and signs involving cognitive functions and awareness: Secondary | ICD-10-CM | POA: Diagnosis not present

## 2015-10-03 LAB — POCT INR: INR: 1.2

## 2015-10-04 DIAGNOSIS — J449 Chronic obstructive pulmonary disease, unspecified: Secondary | ICD-10-CM | POA: Diagnosis not present

## 2015-10-04 DIAGNOSIS — Z9181 History of falling: Secondary | ICD-10-CM | POA: Diagnosis not present

## 2015-10-04 DIAGNOSIS — M6281 Muscle weakness (generalized): Secondary | ICD-10-CM | POA: Diagnosis not present

## 2015-10-04 DIAGNOSIS — I2510 Atherosclerotic heart disease of native coronary artery without angina pectoris: Secondary | ICD-10-CM | POA: Diagnosis not present

## 2015-10-04 DIAGNOSIS — I482 Chronic atrial fibrillation: Secondary | ICD-10-CM | POA: Diagnosis not present

## 2015-10-04 DIAGNOSIS — R260 Ataxic gait: Secondary | ICD-10-CM | POA: Diagnosis not present

## 2015-10-04 DIAGNOSIS — E1142 Type 2 diabetes mellitus with diabetic polyneuropathy: Secondary | ICD-10-CM | POA: Diagnosis not present

## 2015-10-04 DIAGNOSIS — I10 Essential (primary) hypertension: Secondary | ICD-10-CM | POA: Diagnosis not present

## 2015-10-04 DIAGNOSIS — R4189 Other symptoms and signs involving cognitive functions and awareness: Secondary | ICD-10-CM | POA: Diagnosis not present

## 2015-10-08 DIAGNOSIS — R260 Ataxic gait: Secondary | ICD-10-CM | POA: Diagnosis not present

## 2015-10-08 DIAGNOSIS — R4189 Other symptoms and signs involving cognitive functions and awareness: Secondary | ICD-10-CM | POA: Diagnosis not present

## 2015-10-08 DIAGNOSIS — I482 Chronic atrial fibrillation: Secondary | ICD-10-CM | POA: Diagnosis not present

## 2015-10-08 DIAGNOSIS — Z9181 History of falling: Secondary | ICD-10-CM | POA: Diagnosis not present

## 2015-10-08 DIAGNOSIS — E1142 Type 2 diabetes mellitus with diabetic polyneuropathy: Secondary | ICD-10-CM | POA: Diagnosis not present

## 2015-10-08 DIAGNOSIS — J449 Chronic obstructive pulmonary disease, unspecified: Secondary | ICD-10-CM | POA: Diagnosis not present

## 2015-10-08 DIAGNOSIS — I2510 Atherosclerotic heart disease of native coronary artery without angina pectoris: Secondary | ICD-10-CM | POA: Diagnosis not present

## 2015-10-08 DIAGNOSIS — M6281 Muscle weakness (generalized): Secondary | ICD-10-CM | POA: Diagnosis not present

## 2015-10-08 DIAGNOSIS — I10 Essential (primary) hypertension: Secondary | ICD-10-CM | POA: Diagnosis not present

## 2015-10-09 DIAGNOSIS — I2510 Atherosclerotic heart disease of native coronary artery without angina pectoris: Secondary | ICD-10-CM | POA: Diagnosis not present

## 2015-10-09 DIAGNOSIS — Z9181 History of falling: Secondary | ICD-10-CM | POA: Diagnosis not present

## 2015-10-09 DIAGNOSIS — J449 Chronic obstructive pulmonary disease, unspecified: Secondary | ICD-10-CM | POA: Diagnosis not present

## 2015-10-09 DIAGNOSIS — R260 Ataxic gait: Secondary | ICD-10-CM | POA: Diagnosis not present

## 2015-10-09 DIAGNOSIS — I10 Essential (primary) hypertension: Secondary | ICD-10-CM | POA: Diagnosis not present

## 2015-10-09 DIAGNOSIS — I482 Chronic atrial fibrillation: Secondary | ICD-10-CM | POA: Diagnosis not present

## 2015-10-09 DIAGNOSIS — R4189 Other symptoms and signs involving cognitive functions and awareness: Secondary | ICD-10-CM | POA: Diagnosis not present

## 2015-10-09 DIAGNOSIS — E1142 Type 2 diabetes mellitus with diabetic polyneuropathy: Secondary | ICD-10-CM | POA: Diagnosis not present

## 2015-10-09 DIAGNOSIS — M6281 Muscle weakness (generalized): Secondary | ICD-10-CM | POA: Diagnosis not present

## 2015-10-10 ENCOUNTER — Ambulatory Visit (INDEPENDENT_AMBULATORY_CARE_PROVIDER_SITE_OTHER): Payer: Commercial Managed Care - HMO | Admitting: Cardiovascular Disease

## 2015-10-10 DIAGNOSIS — I2510 Atherosclerotic heart disease of native coronary artery without angina pectoris: Secondary | ICD-10-CM | POA: Diagnosis not present

## 2015-10-10 DIAGNOSIS — R4189 Other symptoms and signs involving cognitive functions and awareness: Secondary | ICD-10-CM | POA: Diagnosis not present

## 2015-10-10 DIAGNOSIS — I359 Nonrheumatic aortic valve disorder, unspecified: Secondary | ICD-10-CM

## 2015-10-10 DIAGNOSIS — M6281 Muscle weakness (generalized): Secondary | ICD-10-CM | POA: Diagnosis not present

## 2015-10-10 DIAGNOSIS — I10 Essential (primary) hypertension: Secondary | ICD-10-CM | POA: Diagnosis not present

## 2015-10-10 DIAGNOSIS — E1142 Type 2 diabetes mellitus with diabetic polyneuropathy: Secondary | ICD-10-CM | POA: Diagnosis not present

## 2015-10-10 DIAGNOSIS — J449 Chronic obstructive pulmonary disease, unspecified: Secondary | ICD-10-CM | POA: Diagnosis not present

## 2015-10-10 DIAGNOSIS — I482 Chronic atrial fibrillation: Secondary | ICD-10-CM | POA: Diagnosis not present

## 2015-10-10 DIAGNOSIS — Z9181 History of falling: Secondary | ICD-10-CM | POA: Diagnosis not present

## 2015-10-10 DIAGNOSIS — R260 Ataxic gait: Secondary | ICD-10-CM | POA: Diagnosis not present

## 2015-10-10 DIAGNOSIS — G4733 Obstructive sleep apnea (adult) (pediatric): Secondary | ICD-10-CM | POA: Diagnosis not present

## 2015-10-10 LAB — POCT INR: INR: 1.6

## 2015-10-11 DIAGNOSIS — R4189 Other symptoms and signs involving cognitive functions and awareness: Secondary | ICD-10-CM | POA: Diagnosis not present

## 2015-10-11 DIAGNOSIS — J449 Chronic obstructive pulmonary disease, unspecified: Secondary | ICD-10-CM | POA: Diagnosis not present

## 2015-10-11 DIAGNOSIS — Z9181 History of falling: Secondary | ICD-10-CM | POA: Diagnosis not present

## 2015-10-11 DIAGNOSIS — I10 Essential (primary) hypertension: Secondary | ICD-10-CM | POA: Diagnosis not present

## 2015-10-11 DIAGNOSIS — I2510 Atherosclerotic heart disease of native coronary artery without angina pectoris: Secondary | ICD-10-CM | POA: Diagnosis not present

## 2015-10-11 DIAGNOSIS — R260 Ataxic gait: Secondary | ICD-10-CM | POA: Diagnosis not present

## 2015-10-11 DIAGNOSIS — M6281 Muscle weakness (generalized): Secondary | ICD-10-CM | POA: Diagnosis not present

## 2015-10-11 DIAGNOSIS — E1142 Type 2 diabetes mellitus with diabetic polyneuropathy: Secondary | ICD-10-CM | POA: Diagnosis not present

## 2015-10-11 DIAGNOSIS — I482 Chronic atrial fibrillation: Secondary | ICD-10-CM | POA: Diagnosis not present

## 2015-10-15 DIAGNOSIS — I2510 Atherosclerotic heart disease of native coronary artery without angina pectoris: Secondary | ICD-10-CM | POA: Diagnosis not present

## 2015-10-15 DIAGNOSIS — E1142 Type 2 diabetes mellitus with diabetic polyneuropathy: Secondary | ICD-10-CM | POA: Diagnosis not present

## 2015-10-15 DIAGNOSIS — Z9181 History of falling: Secondary | ICD-10-CM | POA: Diagnosis not present

## 2015-10-15 DIAGNOSIS — I10 Essential (primary) hypertension: Secondary | ICD-10-CM | POA: Diagnosis not present

## 2015-10-15 DIAGNOSIS — I482 Chronic atrial fibrillation: Secondary | ICD-10-CM | POA: Diagnosis not present

## 2015-10-15 DIAGNOSIS — R4189 Other symptoms and signs involving cognitive functions and awareness: Secondary | ICD-10-CM | POA: Diagnosis not present

## 2015-10-15 DIAGNOSIS — J449 Chronic obstructive pulmonary disease, unspecified: Secondary | ICD-10-CM | POA: Diagnosis not present

## 2015-10-15 DIAGNOSIS — M6281 Muscle weakness (generalized): Secondary | ICD-10-CM | POA: Diagnosis not present

## 2015-10-15 DIAGNOSIS — R260 Ataxic gait: Secondary | ICD-10-CM | POA: Diagnosis not present

## 2015-10-18 DIAGNOSIS — I2510 Atherosclerotic heart disease of native coronary artery without angina pectoris: Secondary | ICD-10-CM | POA: Diagnosis not present

## 2015-10-18 DIAGNOSIS — I10 Essential (primary) hypertension: Secondary | ICD-10-CM | POA: Diagnosis not present

## 2015-10-18 DIAGNOSIS — R260 Ataxic gait: Secondary | ICD-10-CM | POA: Diagnosis not present

## 2015-10-18 DIAGNOSIS — E1142 Type 2 diabetes mellitus with diabetic polyneuropathy: Secondary | ICD-10-CM | POA: Diagnosis not present

## 2015-10-18 DIAGNOSIS — J449 Chronic obstructive pulmonary disease, unspecified: Secondary | ICD-10-CM | POA: Diagnosis not present

## 2015-10-18 DIAGNOSIS — M6281 Muscle weakness (generalized): Secondary | ICD-10-CM | POA: Diagnosis not present

## 2015-10-18 DIAGNOSIS — I482 Chronic atrial fibrillation: Secondary | ICD-10-CM | POA: Diagnosis not present

## 2015-10-18 DIAGNOSIS — R4189 Other symptoms and signs involving cognitive functions and awareness: Secondary | ICD-10-CM | POA: Diagnosis not present

## 2015-10-18 DIAGNOSIS — Z9181 History of falling: Secondary | ICD-10-CM | POA: Diagnosis not present

## 2015-10-21 ENCOUNTER — Ambulatory Visit (INDEPENDENT_AMBULATORY_CARE_PROVIDER_SITE_OTHER): Payer: Commercial Managed Care - HMO | Admitting: Cardiology

## 2015-10-21 ENCOUNTER — Encounter: Payer: Commercial Managed Care - HMO | Admitting: Neurology

## 2015-10-21 DIAGNOSIS — E1142 Type 2 diabetes mellitus with diabetic polyneuropathy: Secondary | ICD-10-CM | POA: Diagnosis not present

## 2015-10-21 DIAGNOSIS — R4189 Other symptoms and signs involving cognitive functions and awareness: Secondary | ICD-10-CM | POA: Diagnosis not present

## 2015-10-21 DIAGNOSIS — I359 Nonrheumatic aortic valve disorder, unspecified: Secondary | ICD-10-CM

## 2015-10-21 DIAGNOSIS — I10 Essential (primary) hypertension: Secondary | ICD-10-CM | POA: Diagnosis not present

## 2015-10-21 DIAGNOSIS — I2510 Atherosclerotic heart disease of native coronary artery without angina pectoris: Secondary | ICD-10-CM | POA: Diagnosis not present

## 2015-10-21 DIAGNOSIS — I482 Chronic atrial fibrillation: Secondary | ICD-10-CM | POA: Diagnosis not present

## 2015-10-21 DIAGNOSIS — Z9181 History of falling: Secondary | ICD-10-CM | POA: Diagnosis not present

## 2015-10-21 DIAGNOSIS — J449 Chronic obstructive pulmonary disease, unspecified: Secondary | ICD-10-CM | POA: Diagnosis not present

## 2015-10-21 DIAGNOSIS — M6281 Muscle weakness (generalized): Secondary | ICD-10-CM | POA: Diagnosis not present

## 2015-10-21 DIAGNOSIS — R260 Ataxic gait: Secondary | ICD-10-CM | POA: Diagnosis not present

## 2015-10-21 LAB — POCT INR: INR: 2

## 2015-10-24 DIAGNOSIS — M6281 Muscle weakness (generalized): Secondary | ICD-10-CM | POA: Diagnosis not present

## 2015-10-24 DIAGNOSIS — E1142 Type 2 diabetes mellitus with diabetic polyneuropathy: Secondary | ICD-10-CM | POA: Diagnosis not present

## 2015-10-24 DIAGNOSIS — I482 Chronic atrial fibrillation: Secondary | ICD-10-CM | POA: Diagnosis not present

## 2015-10-24 DIAGNOSIS — Z9181 History of falling: Secondary | ICD-10-CM | POA: Diagnosis not present

## 2015-10-24 DIAGNOSIS — I2510 Atherosclerotic heart disease of native coronary artery without angina pectoris: Secondary | ICD-10-CM | POA: Diagnosis not present

## 2015-10-24 DIAGNOSIS — I10 Essential (primary) hypertension: Secondary | ICD-10-CM | POA: Diagnosis not present

## 2015-10-24 DIAGNOSIS — J449 Chronic obstructive pulmonary disease, unspecified: Secondary | ICD-10-CM | POA: Diagnosis not present

## 2015-10-24 DIAGNOSIS — R4189 Other symptoms and signs involving cognitive functions and awareness: Secondary | ICD-10-CM | POA: Diagnosis not present

## 2015-10-24 DIAGNOSIS — R260 Ataxic gait: Secondary | ICD-10-CM | POA: Diagnosis not present

## 2015-10-25 DIAGNOSIS — I2510 Atherosclerotic heart disease of native coronary artery without angina pectoris: Secondary | ICD-10-CM | POA: Diagnosis not present

## 2015-10-25 DIAGNOSIS — M6281 Muscle weakness (generalized): Secondary | ICD-10-CM | POA: Diagnosis not present

## 2015-10-25 DIAGNOSIS — R260 Ataxic gait: Secondary | ICD-10-CM | POA: Diagnosis not present

## 2015-10-25 DIAGNOSIS — J449 Chronic obstructive pulmonary disease, unspecified: Secondary | ICD-10-CM | POA: Diagnosis not present

## 2015-10-25 DIAGNOSIS — Z9181 History of falling: Secondary | ICD-10-CM | POA: Diagnosis not present

## 2015-10-25 DIAGNOSIS — R4189 Other symptoms and signs involving cognitive functions and awareness: Secondary | ICD-10-CM | POA: Diagnosis not present

## 2015-10-25 DIAGNOSIS — E1142 Type 2 diabetes mellitus with diabetic polyneuropathy: Secondary | ICD-10-CM | POA: Diagnosis not present

## 2015-10-25 DIAGNOSIS — I10 Essential (primary) hypertension: Secondary | ICD-10-CM | POA: Diagnosis not present

## 2015-10-25 DIAGNOSIS — I482 Chronic atrial fibrillation: Secondary | ICD-10-CM | POA: Diagnosis not present

## 2015-10-29 ENCOUNTER — Ambulatory Visit (INDEPENDENT_AMBULATORY_CARE_PROVIDER_SITE_OTHER): Payer: Commercial Managed Care - HMO | Admitting: Cardiology

## 2015-10-29 DIAGNOSIS — I10 Essential (primary) hypertension: Secondary | ICD-10-CM | POA: Diagnosis not present

## 2015-10-29 DIAGNOSIS — E1142 Type 2 diabetes mellitus with diabetic polyneuropathy: Secondary | ICD-10-CM | POA: Diagnosis not present

## 2015-10-29 DIAGNOSIS — R260 Ataxic gait: Secondary | ICD-10-CM | POA: Diagnosis not present

## 2015-10-29 DIAGNOSIS — I482 Chronic atrial fibrillation: Secondary | ICD-10-CM | POA: Diagnosis not present

## 2015-10-29 DIAGNOSIS — M6281 Muscle weakness (generalized): Secondary | ICD-10-CM | POA: Diagnosis not present

## 2015-10-29 DIAGNOSIS — I359 Nonrheumatic aortic valve disorder, unspecified: Secondary | ICD-10-CM

## 2015-10-29 DIAGNOSIS — Z9181 History of falling: Secondary | ICD-10-CM | POA: Diagnosis not present

## 2015-10-29 DIAGNOSIS — I2510 Atherosclerotic heart disease of native coronary artery without angina pectoris: Secondary | ICD-10-CM | POA: Diagnosis not present

## 2015-10-29 DIAGNOSIS — R4189 Other symptoms and signs involving cognitive functions and awareness: Secondary | ICD-10-CM | POA: Diagnosis not present

## 2015-10-29 DIAGNOSIS — J449 Chronic obstructive pulmonary disease, unspecified: Secondary | ICD-10-CM | POA: Diagnosis not present

## 2015-10-29 LAB — POCT INR: INR: 1.8

## 2015-10-30 DIAGNOSIS — E1142 Type 2 diabetes mellitus with diabetic polyneuropathy: Secondary | ICD-10-CM | POA: Diagnosis not present

## 2015-10-30 DIAGNOSIS — R260 Ataxic gait: Secondary | ICD-10-CM | POA: Diagnosis not present

## 2015-10-30 DIAGNOSIS — I2510 Atherosclerotic heart disease of native coronary artery without angina pectoris: Secondary | ICD-10-CM | POA: Diagnosis not present

## 2015-10-30 DIAGNOSIS — Z9181 History of falling: Secondary | ICD-10-CM | POA: Diagnosis not present

## 2015-10-30 DIAGNOSIS — J449 Chronic obstructive pulmonary disease, unspecified: Secondary | ICD-10-CM | POA: Diagnosis not present

## 2015-10-30 DIAGNOSIS — I482 Chronic atrial fibrillation: Secondary | ICD-10-CM | POA: Diagnosis not present

## 2015-10-30 DIAGNOSIS — I10 Essential (primary) hypertension: Secondary | ICD-10-CM | POA: Diagnosis not present

## 2015-10-30 DIAGNOSIS — R4189 Other symptoms and signs involving cognitive functions and awareness: Secondary | ICD-10-CM | POA: Diagnosis not present

## 2015-10-30 DIAGNOSIS — M6281 Muscle weakness (generalized): Secondary | ICD-10-CM | POA: Diagnosis not present

## 2015-11-05 ENCOUNTER — Ambulatory Visit (INDEPENDENT_AMBULATORY_CARE_PROVIDER_SITE_OTHER): Payer: Commercial Managed Care - HMO

## 2015-11-05 ENCOUNTER — Telehealth: Payer: Self-pay | Admitting: Family Medicine

## 2015-11-05 DIAGNOSIS — Z9181 History of falling: Secondary | ICD-10-CM | POA: Diagnosis not present

## 2015-11-05 DIAGNOSIS — R4189 Other symptoms and signs involving cognitive functions and awareness: Secondary | ICD-10-CM | POA: Diagnosis not present

## 2015-11-05 DIAGNOSIS — I482 Chronic atrial fibrillation: Secondary | ICD-10-CM | POA: Diagnosis not present

## 2015-11-05 DIAGNOSIS — I10 Essential (primary) hypertension: Secondary | ICD-10-CM | POA: Diagnosis not present

## 2015-11-05 DIAGNOSIS — J449 Chronic obstructive pulmonary disease, unspecified: Secondary | ICD-10-CM | POA: Diagnosis not present

## 2015-11-05 DIAGNOSIS — I2510 Atherosclerotic heart disease of native coronary artery without angina pectoris: Secondary | ICD-10-CM | POA: Diagnosis not present

## 2015-11-05 DIAGNOSIS — I359 Nonrheumatic aortic valve disorder, unspecified: Secondary | ICD-10-CM

## 2015-11-05 DIAGNOSIS — W19XXXA Unspecified fall, initial encounter: Secondary | ICD-10-CM | POA: Insufficient documentation

## 2015-11-05 DIAGNOSIS — M6281 Muscle weakness (generalized): Secondary | ICD-10-CM | POA: Diagnosis not present

## 2015-11-05 DIAGNOSIS — R296 Repeated falls: Secondary | ICD-10-CM | POA: Insufficient documentation

## 2015-11-05 DIAGNOSIS — R260 Ataxic gait: Secondary | ICD-10-CM | POA: Diagnosis not present

## 2015-11-05 DIAGNOSIS — E1142 Type 2 diabetes mellitus with diabetic polyneuropathy: Secondary | ICD-10-CM | POA: Diagnosis not present

## 2015-11-05 DIAGNOSIS — R269 Unspecified abnormalities of gait and mobility: Secondary | ICD-10-CM | POA: Insufficient documentation

## 2015-11-05 LAB — POCT INR: INR: 1.4

## 2015-11-05 NOTE — Telephone Encounter (Signed)
Placed in providers box for review. Page, cma.

## 2015-11-05 NOTE — Telephone Encounter (Signed)
Patient's daughter asks PCP to complete [place] [employer] Parking placard. Please, follow up with Ms. Lacinda Axon.

## 2015-11-06 NOTE — Telephone Encounter (Signed)
Spoke with patients daughter and informed her the application for the placard is ready to be picked up. Page, cma.

## 2015-11-10 DIAGNOSIS — G4733 Obstructive sleep apnea (adult) (pediatric): Secondary | ICD-10-CM | POA: Diagnosis not present

## 2015-11-12 ENCOUNTER — Ambulatory Visit (INDEPENDENT_AMBULATORY_CARE_PROVIDER_SITE_OTHER): Payer: Commercial Managed Care - HMO | Admitting: Cardiology

## 2015-11-12 DIAGNOSIS — M6281 Muscle weakness (generalized): Secondary | ICD-10-CM | POA: Diagnosis not present

## 2015-11-12 DIAGNOSIS — R4189 Other symptoms and signs involving cognitive functions and awareness: Secondary | ICD-10-CM | POA: Diagnosis not present

## 2015-11-12 DIAGNOSIS — Z9181 History of falling: Secondary | ICD-10-CM | POA: Diagnosis not present

## 2015-11-12 DIAGNOSIS — I482 Chronic atrial fibrillation: Secondary | ICD-10-CM | POA: Diagnosis not present

## 2015-11-12 DIAGNOSIS — I359 Nonrheumatic aortic valve disorder, unspecified: Secondary | ICD-10-CM

## 2015-11-12 DIAGNOSIS — E1142 Type 2 diabetes mellitus with diabetic polyneuropathy: Secondary | ICD-10-CM | POA: Diagnosis not present

## 2015-11-12 DIAGNOSIS — R260 Ataxic gait: Secondary | ICD-10-CM | POA: Diagnosis not present

## 2015-11-12 DIAGNOSIS — I10 Essential (primary) hypertension: Secondary | ICD-10-CM | POA: Diagnosis not present

## 2015-11-12 DIAGNOSIS — I2510 Atherosclerotic heart disease of native coronary artery without angina pectoris: Secondary | ICD-10-CM | POA: Diagnosis not present

## 2015-11-12 DIAGNOSIS — J449 Chronic obstructive pulmonary disease, unspecified: Secondary | ICD-10-CM | POA: Diagnosis not present

## 2015-11-12 LAB — POCT INR: INR: 1.5

## 2015-11-19 ENCOUNTER — Ambulatory Visit (INDEPENDENT_AMBULATORY_CARE_PROVIDER_SITE_OTHER): Payer: Commercial Managed Care - HMO | Admitting: Internal Medicine

## 2015-11-19 DIAGNOSIS — J449 Chronic obstructive pulmonary disease, unspecified: Secondary | ICD-10-CM | POA: Diagnosis not present

## 2015-11-19 DIAGNOSIS — E1142 Type 2 diabetes mellitus with diabetic polyneuropathy: Secondary | ICD-10-CM | POA: Diagnosis not present

## 2015-11-19 DIAGNOSIS — R4189 Other symptoms and signs involving cognitive functions and awareness: Secondary | ICD-10-CM | POA: Diagnosis not present

## 2015-11-19 DIAGNOSIS — Z9181 History of falling: Secondary | ICD-10-CM | POA: Diagnosis not present

## 2015-11-19 DIAGNOSIS — R260 Ataxic gait: Secondary | ICD-10-CM | POA: Diagnosis not present

## 2015-11-19 DIAGNOSIS — I482 Chronic atrial fibrillation: Secondary | ICD-10-CM | POA: Diagnosis not present

## 2015-11-19 DIAGNOSIS — I10 Essential (primary) hypertension: Secondary | ICD-10-CM | POA: Diagnosis not present

## 2015-11-19 DIAGNOSIS — I359 Nonrheumatic aortic valve disorder, unspecified: Secondary | ICD-10-CM

## 2015-11-19 DIAGNOSIS — M6281 Muscle weakness (generalized): Secondary | ICD-10-CM | POA: Diagnosis not present

## 2015-11-19 DIAGNOSIS — I2510 Atherosclerotic heart disease of native coronary artery without angina pectoris: Secondary | ICD-10-CM | POA: Diagnosis not present

## 2015-11-19 LAB — POCT INR: INR: 3.2

## 2015-11-21 ENCOUNTER — Ambulatory Visit (INDEPENDENT_AMBULATORY_CARE_PROVIDER_SITE_OTHER): Payer: Self-pay | Admitting: Neurology

## 2015-11-21 ENCOUNTER — Encounter: Payer: Self-pay | Admitting: Neurology

## 2015-11-21 ENCOUNTER — Ambulatory Visit (INDEPENDENT_AMBULATORY_CARE_PROVIDER_SITE_OTHER): Payer: Commercial Managed Care - HMO | Admitting: Neurology

## 2015-11-21 DIAGNOSIS — E1142 Type 2 diabetes mellitus with diabetic polyneuropathy: Secondary | ICD-10-CM

## 2015-11-21 NOTE — Procedures (Signed)
     HISTORY:  Matthew Edwards is a 75 year old gentleman with a history of diabetes and a history of a gait disorder. The patient has had some gait instability, he has fallen on occasion, the last fall was approximately 2 weeks ago. The patient denies any significant low back pain or pain down the legs, he feels as if his left leg is somewhat weaker than the right. The patient is being evaluated for the etiology of the gait instability. He does report some numbness in the feet, but with no significant pain issues in this regard.  NERVE CONDUCTION STUDIES:  Nerve conduction studies were performed on both lower extremities. The distal motor latencies for the peroneal and posterior tibial nerves were normal bilaterally, with low motor amplitudes for these nerves bilaterally. The H reflex latencies were unobtainable bilaterally, and the peroneal sensory latencies were unobtainable bilaterally. The nerve conduction velocities for the peroneal and posterior tibial nerves were normal bilaterally.  EMG STUDIES:  EMG study was performed on the left lower extremity:  The tibialis anterior muscle reveals 2 to 4K motor units with full recruitment. No fibrillations or positive waves were seen. The peroneus tertius muscle reveals 2 to 4K motor units with full recruitment. No fibrillations or positive waves were seen. The medial gastrocnemius muscle reveals 1 to 3K motor units with full recruitment. No fibrillations or positive waves were seen. The vastus lateralis muscle reveals 2 to 4K motor units with full recruitment. No fibrillations or positive waves were seen. The iliopsoas muscle reveals 2 to 4K motor units with full recruitment. No fibrillations or positive waves were seen. The biceps femoris muscle (long head) reveals 2 to 4K motor units with full recruitment. No fibrillations or positive waves were seen. The lumbosacral paraspinal muscles were tested at 3 levels, and revealed 1+ positive waves at all  3 levels tested. There was good relaxation.   IMPRESSION:  Nerve conduction studies performed on both lower extremities shows evidence of a primarily axonal peripheral neuropathy of moderate severity. This may be associated with the history of diabetes. EMG evaluation of the left lower extremity shows no significant acute or chronic denervation in the muscles of the leg, but there is mild denervation of the lumbosacral paraspinal muscles. A mild lumbosacral radiculopathy of indeterminate level is suggested by this study, but there appears to be no clinical correlate with the patient.  Jill Alexanders MD 11/21/2015 4:47 PM  [facility] [address] [place], [place] [address]  Phone [phone] Fax [phone]

## 2015-11-21 NOTE — Progress Notes (Signed)
Please refer to EMG and nerve conduction study procedure note. 

## 2015-11-27 ENCOUNTER — Ambulatory Visit (INDEPENDENT_AMBULATORY_CARE_PROVIDER_SITE_OTHER): Payer: Commercial Managed Care - HMO | Admitting: Pharmacist

## 2015-11-27 DIAGNOSIS — I10 Essential (primary) hypertension: Secondary | ICD-10-CM | POA: Diagnosis not present

## 2015-11-27 DIAGNOSIS — M6281 Muscle weakness (generalized): Secondary | ICD-10-CM | POA: Diagnosis not present

## 2015-11-27 DIAGNOSIS — E1142 Type 2 diabetes mellitus with diabetic polyneuropathy: Secondary | ICD-10-CM | POA: Diagnosis not present

## 2015-11-27 DIAGNOSIS — I359 Nonrheumatic aortic valve disorder, unspecified: Secondary | ICD-10-CM

## 2015-11-27 DIAGNOSIS — I482 Chronic atrial fibrillation: Secondary | ICD-10-CM | POA: Diagnosis not present

## 2015-11-27 DIAGNOSIS — I2510 Atherosclerotic heart disease of native coronary artery without angina pectoris: Secondary | ICD-10-CM | POA: Diagnosis not present

## 2015-11-27 DIAGNOSIS — Z9181 History of falling: Secondary | ICD-10-CM | POA: Diagnosis not present

## 2015-11-27 DIAGNOSIS — R4189 Other symptoms and signs involving cognitive functions and awareness: Secondary | ICD-10-CM | POA: Diagnosis not present

## 2015-11-27 DIAGNOSIS — R260 Ataxic gait: Secondary | ICD-10-CM | POA: Diagnosis not present

## 2015-11-27 DIAGNOSIS — J449 Chronic obstructive pulmonary disease, unspecified: Secondary | ICD-10-CM | POA: Diagnosis not present

## 2015-11-27 LAB — POCT INR: INR: 1.8

## 2015-11-28 ENCOUNTER — Encounter: Payer: Self-pay | Admitting: Family Medicine

## 2015-11-28 ENCOUNTER — Ambulatory Visit (INDEPENDENT_AMBULATORY_CARE_PROVIDER_SITE_OTHER): Payer: Commercial Managed Care - HMO | Admitting: Family Medicine

## 2015-11-28 VITALS — BP 150/81 | HR 68 | Temp 98.3°F | Wt 249.0 lb

## 2015-11-28 DIAGNOSIS — E119 Type 2 diabetes mellitus without complications: Secondary | ICD-10-CM

## 2015-11-28 DIAGNOSIS — M4806 Spinal stenosis, lumbar region: Secondary | ICD-10-CM

## 2015-11-28 DIAGNOSIS — K5901 Slow transit constipation: Secondary | ICD-10-CM | POA: Diagnosis not present

## 2015-11-28 DIAGNOSIS — M48061 Spinal stenosis, lumbar region without neurogenic claudication: Secondary | ICD-10-CM | POA: Insufficient documentation

## 2015-11-28 DIAGNOSIS — I272 Other secondary pulmonary hypertension: Secondary | ICD-10-CM

## 2015-11-28 DIAGNOSIS — Z7901 Long term (current) use of anticoagulants: Secondary | ICD-10-CM

## 2015-11-28 DIAGNOSIS — R531 Weakness: Secondary | ICD-10-CM

## 2015-11-28 DIAGNOSIS — E118 Type 2 diabetes mellitus with unspecified complications: Secondary | ICD-10-CM

## 2015-11-28 DIAGNOSIS — R29898 Other symptoms and signs involving the musculoskeletal system: Secondary | ICD-10-CM

## 2015-11-28 DIAGNOSIS — I482 Chronic atrial fibrillation, unspecified: Secondary | ICD-10-CM

## 2015-11-28 DIAGNOSIS — E0843 Diabetes mellitus due to underlying condition with diabetic autonomic (poly)neuropathy: Secondary | ICD-10-CM | POA: Diagnosis not present

## 2015-11-28 DIAGNOSIS — Z794 Long term (current) use of insulin: Secondary | ICD-10-CM | POA: Diagnosis not present

## 2015-11-28 DIAGNOSIS — I10 Essential (primary) hypertension: Secondary | ICD-10-CM | POA: Diagnosis not present

## 2015-11-28 DIAGNOSIS — J42 Unspecified chronic bronchitis: Secondary | ICD-10-CM

## 2015-11-28 DIAGNOSIS — I2510 Atherosclerotic heart disease of native coronary artery without angina pectoris: Secondary | ICD-10-CM

## 2015-11-28 DIAGNOSIS — I5033 Acute on chronic diastolic (congestive) heart failure: Secondary | ICD-10-CM

## 2015-11-28 LAB — POCT GLYCOSYLATED HEMOGLOBIN (HGB A1C): Hemoglobin A1C: 7.4

## 2015-11-28 MED ORDER — METFORMIN HCL 500 MG PO TABS: 500.0000 mg | ORAL_TABLET | Freq: Two times a day (BID) | ORAL | Status: DC

## 2015-11-28 MED ORDER — BENAZEPRIL HCL 20 MG PO TABS: 20.0000 mg | ORAL_TABLET | Freq: Every day | ORAL | Status: DC

## 2015-11-28 MED ORDER — DILTIAZEM HCL ER COATED BEADS 120 MG PO CP24: 120.0000 mg | ORAL_CAPSULE | Freq: Every day | ORAL | Status: DC

## 2015-11-28 MED ORDER — ALBUTEROL SULFATE HFA 108 (90 BASE) MCG/ACT IN AERS: 2.0000 | INHALATION_SPRAY | Freq: Four times a day (QID) | RESPIRATORY_TRACT | Status: DC | PRN

## 2015-11-28 MED ORDER — SENNA 8.6 MG PO TABS: 2.0000 | ORAL_TABLET | Freq: Every day | ORAL | Status: DC

## 2015-11-28 MED ORDER — WARFARIN SODIUM 5 MG PO TABS: 5.5000 mg | ORAL_TABLET | Freq: Every day | ORAL | Status: DC

## 2015-11-28 MED ORDER — GLIPIZIDE 10 MG PO TABS: ORAL_TABLET | ORAL | Status: DC

## 2015-11-28 MED ORDER — FUROSEMIDE 40 MG PO TABS: 40.0000 mg | ORAL_TABLET | Freq: Two times a day (BID) | ORAL | Status: DC

## 2015-11-28 MED ORDER — CALCIUM POLYCARBOPHIL 625 MG PO TABS: 625.0000 mg | ORAL_TABLET | Freq: Every day | ORAL | Status: DC

## 2015-11-28 MED ORDER — POTASSIUM CHLORIDE CRYS ER 20 MEQ PO TBCR: 20.0000 meq | EXTENDED_RELEASE_TABLET | Freq: Every day | ORAL | Status: DC

## 2015-11-28 MED ORDER — POLYETHYLENE GLYCOL 3350 17 GM/SCOOP PO POWD: 17.0000 g | Freq: Two times a day (BID) | ORAL | Status: DC | PRN

## 2015-11-28 MED ORDER — ATORVASTATIN CALCIUM 40 MG PO TABS: 40.0000 mg | ORAL_TABLET | Freq: Every day | ORAL | Status: DC

## 2015-11-28 MED ORDER — METOPROLOL SUCCINATE ER 25 MG PO TB24: 25.0000 mg | ORAL_TABLET | Freq: Every day | ORAL | Status: DC

## 2015-11-28 MED ORDER — BUDESONIDE-FORMOTEROL FUMARATE 80-4.5 MCG/ACT IN AERO: 2.0000 | INHALATION_SPRAY | Freq: Two times a day (BID) | RESPIRATORY_TRACT | Status: DC

## 2015-11-28 NOTE — Assessment & Plan Note (Signed)
Doing well on Symbicort--albuterol prn

## 2015-11-28 NOTE — Progress Notes (Signed)
Subjective:    Patient ID: Matthew Edwards is a 75 y.o. male presenting with Follow-up  on 11/28/2015  HPI: Here following admission for falls and weakness, followed by stent in rehab Work-up included some spinal stenosis on MRI and recent neuro testing suggests nerve issues. Patient states he is walking well with his cane. He is out of meds and needs refills. He denies back pain. Needs toenails to be cut had podiatry referral but they could not reach him and needs optho referral. Also very concerned about constipaiton. Using Miralax, senna and fibercon and still not emptying well. Due for colonoscopy. Review of Systems  Constitutional: Negative for fever and chills.  Respiratory: Negative for shortness of breath.   Cardiovascular: Negative for leg swelling.  Gastrointestinal: Positive for constipation. Negative for nausea, vomiting and abdominal pain.  Musculoskeletal: Negative for back pain.      Objective:    BP 150/81 mmHg  Pulse 68  Temp(Src) 98.3 F (36.8 C) (Oral)  Wt 249 lb (112.946 kg)  SpO2 96% Physical Exam  Constitutional: He appears well-developed and well-nourished. No distress.  HENT:  Head: Normocephalic and atraumatic.  Eyes: No scleral icterus.  Neck: Neck supple.  Cardiovascular: Normal rate.   Pulmonary/Chest: Effort normal and breath sounds normal. No respiratory distress.  Abdominal: Soft.  Musculoskeletal: He exhibits no edema.  Neurological: He is alert.  Skin: Skin is warm.  Psychiatric: He has a normal mood and affect.  Vitals reviewed.       Assessment & Plan:   Problem List Items Addressed This Visit      Unprioritized   Essential hypertension    Continue current meds--none today, but BP is elevated      Relevant Medications   atorvastatin (LIPITOR) 40 MG tablet   benazepril (LOTENSIN) 20 MG tablet   diltiazem (CARDIZEM CD) 120 MG 24 hr capsule   furosemide (LASIX) 40 MG tablet   metoprolol succinate (TOPROL-XL) 25 MG 24 hr  tablet   warfarin (COUMADIN) 5 MG tablet   potassium chloride SA (K-DUR,KLOR-CON) 20 MEQ tablet   Coronary atherosclerosis   Relevant Medications   atorvastatin (LIPITOR) 40 MG tablet   benazepril (LOTENSIN) 20 MG tablet   diltiazem (CARDIZEM CD) 120 MG 24 hr capsule   furosemide (LASIX) 40 MG tablet   metoprolol succinate (TOPROL-XL) 25 MG 24 hr tablet   warfarin (COUMADIN) 5 MG tablet   COPD (chronic obstructive pulmonary disease) (HCC)    Doing well on Symbicort--albuterol prn      Relevant Medications   albuterol (PROVENTIL HFA;VENTOLIN HFA) 108 (90 Base) MCG/ACT inhaler   budesonide-formoterol (SYMBICORT) 80-4.5 MCG/ACT inhaler   Diabetic neuropathy (HCC)   Relevant Medications   atorvastatin (LIPITOR) 40 MG tablet   benazepril (LOTENSIN) 20 MG tablet   glipiZIDE (GLUCOTROL) 10 MG tablet   metFORMIN (GLUCOPHAGE) 500 MG tablet   Other Relevant Orders   Ambulatory referral to Ophthalmology   Acute on chronic congestive heart failure with left ventricular diastolic dysfunction (HCC)   Relevant Medications   atorvastatin (LIPITOR) 40 MG tablet   benazepril (LOTENSIN) 20 MG tablet   diltiazem (CARDIZEM CD) 120 MG 24 hr capsule   furosemide (LASIX) 40 MG tablet   metoprolol succinate (TOPROL-XL) 25 MG 24 hr tablet   warfarin (COUMADIN) 5 MG tablet   Diabetes mellitus type 2 with complications (HCC) - Primary    A1C is 7.4 today--will continue current regimen.      Relevant Medications   atorvastatin (LIPITOR) 40  MG tablet   benazepril (LOTENSIN) 20 MG tablet   glipiZIDE (GLUCOTROL) 10 MG tablet   metFORMIN (GLUCOPHAGE) 500 MG tablet   Other Relevant Orders   POCT A1C (Completed)   Chronic anticoagulation   Pulmonary HTN (HCC)   Relevant Medications   atorvastatin (LIPITOR) 40 MG tablet   benazepril (LOTENSIN) 20 MG tablet   diltiazem (CARDIZEM CD) 120 MG 24 hr capsule   furosemide (LASIX) 40 MG tablet   metoprolol succinate (TOPROL-XL) 25 MG 24 hr tablet   warfarin  (COUMADIN) 5 MG tablet   Constipation   Relevant Medications   polyethylene glycol powder (GLYCOLAX/MIRALAX) powder   senna (SENOKOT) 8.6 MG TABS tablet   polycarbophil (FIBERCON) 625 MG tablet   Other Relevant Orders   Ambulatory referral to Gastroenterology   Weakness   Atrial fibrillation (HCC)    Continue Coumadin--to see cards next month      Relevant Medications   atorvastatin (LIPITOR) 40 MG tablet   benazepril (LOTENSIN) 20 MG tablet   diltiazem (CARDIZEM CD) 120 MG 24 hr capsule   furosemide (LASIX) 40 MG tablet   metoprolol succinate (TOPROL-XL) 25 MG 24 hr tablet   warfarin (COUMADIN) 5 MG tablet   Lower extremity weakness   Spinal stenosis of lumbar region    Given neuromuscular testing and MRI findings and falls--will have patient see a spine/ortho person for their input as to care. Seems unwilling to proceed with surgical intervention at this time.      Relevant Orders   Ambulatory referral to Orthopedic Surgery    Other Visit Diagnoses    Controlled type 2 diabetes mellitus without complication, with long-term current use of insulin (HCC)        Relevant Medications    atorvastatin (LIPITOR) 40 MG tablet    benazepril (LOTENSIN) 20 MG tablet    glipiZIDE (GLUCOTROL) 10 MG tablet    metFORMIN (GLUCOPHAGE) 500 MG tablet      Consider Living will discussion at next visit.  Return in about 6 weeks (around 01/09/2016) for a follow-up.  PRATT,TANYA S 11/28/2015 3:51 PM

## 2015-11-28 NOTE — Assessment & Plan Note (Signed)
Continue current meds--none today, but BP is elevated

## 2015-11-28 NOTE — Assessment & Plan Note (Signed)
Continue Coumadin--to see cards next month

## 2015-11-28 NOTE — Assessment & Plan Note (Signed)
A1C is 7.4 today--will continue current regimen.

## 2015-11-28 NOTE — Assessment & Plan Note (Signed)
Given neuromuscular testing and MRI findings and falls--will have patient see a spine/ortho person for their input as to care. Seems unwilling to proceed with surgical intervention at this time.

## 2015-11-28 NOTE — Patient Instructions (Signed)
Advance Directive Advance directives are the legal documents that allow you to make choices about your health care and medical treatment if you cannot speak for yourself. Advance directives are a way for you to communicate your wishes to family, friends, and health care providers. The specified people can then convey your decisions about end-of-life care to avoid confusion if you should become unable to communicate. Ideally, the process of discussing and writing advance directives should happen over time rather than making decisions all at once. Advance directives can be modified as your situation changes, and you can change your mind at any time, even after you have signed the advance directives. Each state has its own laws regarding advance directives. You may want to check with your health care provider, attorney, or state representative about the law in your state. Below are some examples of advance directives. LIVING WILL A living will is a set of instructions documenting your wishes about medical care when you cannot care for yourself. It is used if you become:  Terminally ill.  Incapacitated.  Unable to communicate.  Unable to make decisions. Items to consider in your living will include:  The use or non-use of life-sustaining equipment, such as dialysis machines and breathing machines (ventilators).  A do not resuscitate (DNR) order, which is the instruction not to use cardiopulmonary resuscitation (CPR) if breathing or heartbeat stops.  Tube feeding.  Withholding of food and fluids.  Comfort (palliative) care when the goal becomes comfort rather than a cure.  Organ and tissue donation. A living will does not give instructions about distribution of your money and property if you should pass away. It is advisable to seek the expert advice of a lawyer in drawing up a will regarding your possessions. Decisions about taxes, beneficiaries, and asset distribution will be legally binding.  This process can relieve your family and friends of any burdens surrounding disputes or questions that may come up about the allocation of your assets. DO NOT RESUSCITATE (DNR) A do not resuscitate (DNR) order is a request to not have CPR in the event that your heart stops beating or you stop breathing. Unless given other instructions, a health care provider will try to help any patient whose heart has stopped or who has stopped breathing.  HEALTH CARE PROXY AND DURABLE POWER OF ATTORNEY FOR HEALTH CARE A health care proxy is a person (agent) appointed to make medical decisions for you if you cannot. Generally, people choose someone they know well and trust to represent their preferences when they can no longer do so. You should be sure to ask this person for agreement to act as your agent. An agent may have to exercise judgment in the event of a medical decision for which your wishes are not known. The durable power of attorney for health care is the legal document that names your health care proxy. Once written, it should be:  Signed.  Notarized.  Dated.  Copied.  Witnessed.  Incorporated into your medical record. You may also want to appoint someone to manage your financial affairs if you cannot. This is called a durable power of attorney for finances. It is a separate legal document from the durable power of attorney for health care. You may choose the same person or someone different from your health care proxy to act as your agent in financial matters.   This information is not intended to replace advice given to you by your health care provider. Make sure you discuss any  questions you have with your health care provider.   Document Released: 08/25/2007 Document Revised: 05/23/2013 Document Reviewed: 10/05/2012 Elsevier Interactive Patient Education 2016 [place] DASH stands for "Dietary Approaches to Stop Hypertension." The DASH eating plan is a healthy eating  plan that has been shown to reduce high blood pressure (hypertension). Additional health benefits may include reducing the risk of type 2 diabetes mellitus, heart disease, and stroke. The DASH eating plan may also help with weight loss. WHAT DO I NEED TO KNOW ABOUT THE DASH EATING PLAN? For the DASH eating plan, you will follow these general guidelines:  Choose foods with a percent daily value for sodium of less than 5% (as listed on the food label).  Use salt-free seasonings or herbs instead of table salt or sea salt.  Check with your health care provider or pharmacist before using salt substitutes.  Eat lower-sodium products, often labeled as "lower sodium" or "no salt added."  Eat fresh foods.  Eat more vegetables, fruits, and low-fat dairy products.  Choose whole grains. Look for the word "whole" as the first word in the ingredient list.  Choose fish and skinless chicken or [place] more often than red meat. Limit fish, poultry, and meat to 6 oz (170 g) each day.  Limit sweets, desserts, sugars, and sugary drinks.  Choose heart-healthy fats.  Limit cheese to 1 oz (28 g) per day.  Eat more home-cooked food and less restaurant, buffet, and fast food.  Limit fried foods.  [place] foods using methods other than frying.  Limit canned vegetables. If you do use them, rinse them well to decrease the sodium.  When eating at a restaurant, ask that your food be prepared with less salt, or no salt if possible. WHAT FOODS CAN I EAT? Seek help from a dietitian for individual calorie needs. Grains Whole grain or whole wheat bread. Brown rice. Whole grain or whole wheat pasta. Quinoa, bulgur, and whole grain cereals. Low-sodium cereals. Corn or whole wheat flour tortillas. Whole grain cornbread. Whole grain crackers. Low-sodium crackers. Vegetables Fresh or frozen vegetables (raw, steamed, roasted, or grilled). Low-sodium or reduced-sodium tomato and vegetable juices. Low-sodium or  reduced-sodium tomato sauce and paste. Low-sodium or reduced-sodium canned vegetables.  Fruits All fresh, canned (in natural juice), or frozen fruits. Meat and Other Protein Products Ground beef (85% or leaner), grass-fed beef, or beef trimmed of fat. Skinless chicken or [place]. Ground chicken or [place]. Pork trimmed of fat. All fish and seafood. Eggs. Dried beans, peas, or lentils. Unsalted nuts and seeds. Unsalted canned beans. Dairy Low-fat dairy products, such as skim or 1% milk, 2% or reduced-fat cheeses, low-fat ricotta or cottage cheese, or plain low-fat yogurt. Low-sodium or reduced-sodium cheeses. Fats and Oils Tub margarines without trans fats. Light or reduced-fat mayonnaise and salad dressings (reduced sodium). Avocado. Safflower, olive, or canola oils. Natural peanut or almond butter. Other Unsalted popcorn and pretzels. The items listed above may not be a complete list of recommended foods or beverages. Contact your dietitian for more options. WHAT FOODS ARE NOT RECOMMENDED? Grains White bread. White pasta. White rice. Refined cornbread. Bagels and croissants. Crackers that contain trans fat. Vegetables Creamed or fried vegetables. Vegetables in a cheese sauce. Regular canned vegetables. Regular canned tomato sauce and paste. Regular tomato and vegetable juices. Fruits Dried fruits. Canned fruit in light or heavy syrup. Fruit juice. Meat and Other Protein Products Fatty cuts of meat. Ribs, chicken wings, bacon, sausage, bologna, salami, chitterlings, fatback, hot dogs, bratwurst,  and packaged luncheon meats. Salted nuts and seeds. Canned beans with salt. Dairy Whole or 2% milk, cream, half-and-half, and cream cheese. Whole-fat or sweetened yogurt. Full-fat cheeses or blue cheese. Nondairy creamers and whipped toppings. Processed cheese, cheese spreads, or cheese curds. Condiments Onion and garlic salt, seasoned salt, table salt, and sea salt. Canned and packaged gravies.  Worcestershire sauce. Tartar sauce. Barbecue sauce. Teriyaki sauce. Soy sauce, including reduced sodium. Steak sauce. Fish sauce. Oyster sauce. Cocktail sauce. Horseradish. Ketchup and mustard. Meat flavorings and tenderizers. Bouillon cubes. Hot sauce. Tabasco sauce. Marinades. Taco seasonings. Relishes. Fats and Oils Butter, stick margarine, lard, shortening, ghee, and bacon fat. Coconut, palm kernel, or palm oils. Regular salad dressings. Other Pickles and olives. Salted popcorn and pretzels. The items listed above may not be a complete list of foods and beverages to avoid. Contact your dietitian for more information. WHERE CAN I FIND MORE INFORMATION? National Heart, Lung, and Blood Institute: travelstabloid.com   This information is not intended to replace advice given to you by your health care provider. Make sure you discuss any questions you have with your health care provider.   Document Released: 05/07/2011 Document Revised: 06/08/2014 Document Reviewed: 03/22/2013 Elsevier Interactive Patient Education [employer].

## 2015-11-29 ENCOUNTER — Telehealth: Payer: Self-pay | Admitting: *Deleted

## 2015-11-29 DIAGNOSIS — E119 Type 2 diabetes mellitus without complications: Secondary | ICD-10-CM

## 2015-11-29 DIAGNOSIS — E0843 Diabetes mellitus due to underlying condition with diabetic autonomic (poly)neuropathy: Secondary | ICD-10-CM

## 2015-11-29 DIAGNOSIS — Z794 Long term (current) use of insulin: Secondary | ICD-10-CM

## 2015-11-29 MED ORDER — GLIPIZIDE 10 MG PO TABS: ORAL_TABLET | ORAL | Status: DC

## 2015-11-29 NOTE — Telephone Encounter (Signed)
Received fax from [facility] needing clarification on Rx for glipizide 10 mg directions.  Rx stated take medication at 5 PM with a meal. Rx sent with correct directions.  Derl Barrow, RN

## 2015-12-04 ENCOUNTER — Ambulatory Visit (INDEPENDENT_AMBULATORY_CARE_PROVIDER_SITE_OTHER): Payer: Commercial Managed Care - HMO | Admitting: Internal Medicine

## 2015-12-04 DIAGNOSIS — Z9181 History of falling: Secondary | ICD-10-CM | POA: Diagnosis not present

## 2015-12-04 DIAGNOSIS — R4189 Other symptoms and signs involving cognitive functions and awareness: Secondary | ICD-10-CM | POA: Diagnosis not present

## 2015-12-04 DIAGNOSIS — I359 Nonrheumatic aortic valve disorder, unspecified: Secondary | ICD-10-CM

## 2015-12-04 DIAGNOSIS — I482 Chronic atrial fibrillation: Secondary | ICD-10-CM | POA: Diagnosis not present

## 2015-12-04 DIAGNOSIS — E1142 Type 2 diabetes mellitus with diabetic polyneuropathy: Secondary | ICD-10-CM | POA: Diagnosis not present

## 2015-12-04 DIAGNOSIS — Z7984 Long term (current) use of oral hypoglycemic drugs: Secondary | ICD-10-CM | POA: Diagnosis not present

## 2015-12-04 DIAGNOSIS — M6281 Muscle weakness (generalized): Secondary | ICD-10-CM | POA: Diagnosis not present

## 2015-12-04 DIAGNOSIS — I10 Essential (primary) hypertension: Secondary | ICD-10-CM | POA: Diagnosis not present

## 2015-12-04 DIAGNOSIS — I2510 Atherosclerotic heart disease of native coronary artery without angina pectoris: Secondary | ICD-10-CM | POA: Diagnosis not present

## 2015-12-04 DIAGNOSIS — J449 Chronic obstructive pulmonary disease, unspecified: Secondary | ICD-10-CM | POA: Diagnosis not present

## 2015-12-04 LAB — POCT INR: INR: 2.1

## 2015-12-06 ENCOUNTER — Encounter: Payer: Self-pay | Admitting: Gastroenterology

## 2015-12-10 DIAGNOSIS — G4733 Obstructive sleep apnea (adult) (pediatric): Secondary | ICD-10-CM | POA: Diagnosis not present

## 2015-12-11 DIAGNOSIS — I482 Chronic atrial fibrillation: Secondary | ICD-10-CM | POA: Diagnosis not present

## 2015-12-11 DIAGNOSIS — R4189 Other symptoms and signs involving cognitive functions and awareness: Secondary | ICD-10-CM | POA: Diagnosis not present

## 2015-12-11 DIAGNOSIS — M6281 Muscle weakness (generalized): Secondary | ICD-10-CM | POA: Diagnosis not present

## 2015-12-11 DIAGNOSIS — I10 Essential (primary) hypertension: Secondary | ICD-10-CM | POA: Diagnosis not present

## 2015-12-11 DIAGNOSIS — E1142 Type 2 diabetes mellitus with diabetic polyneuropathy: Secondary | ICD-10-CM | POA: Diagnosis not present

## 2015-12-11 DIAGNOSIS — Z9181 History of falling: Secondary | ICD-10-CM | POA: Diagnosis not present

## 2015-12-11 DIAGNOSIS — I2510 Atherosclerotic heart disease of native coronary artery without angina pectoris: Secondary | ICD-10-CM | POA: Diagnosis not present

## 2015-12-11 DIAGNOSIS — J449 Chronic obstructive pulmonary disease, unspecified: Secondary | ICD-10-CM | POA: Diagnosis not present

## 2015-12-11 DIAGNOSIS — Z7984 Long term (current) use of oral hypoglycemic drugs: Secondary | ICD-10-CM | POA: Diagnosis not present

## 2015-12-18 ENCOUNTER — Ambulatory Visit (INDEPENDENT_AMBULATORY_CARE_PROVIDER_SITE_OTHER): Payer: Commercial Managed Care - HMO | Admitting: Cardiology

## 2015-12-18 DIAGNOSIS — I482 Chronic atrial fibrillation: Secondary | ICD-10-CM | POA: Diagnosis not present

## 2015-12-18 DIAGNOSIS — I359 Nonrheumatic aortic valve disorder, unspecified: Secondary | ICD-10-CM

## 2015-12-18 DIAGNOSIS — I10 Essential (primary) hypertension: Secondary | ICD-10-CM | POA: Diagnosis not present

## 2015-12-18 DIAGNOSIS — E1142 Type 2 diabetes mellitus with diabetic polyneuropathy: Secondary | ICD-10-CM | POA: Diagnosis not present

## 2015-12-18 DIAGNOSIS — J449 Chronic obstructive pulmonary disease, unspecified: Secondary | ICD-10-CM | POA: Diagnosis not present

## 2015-12-18 DIAGNOSIS — Z9181 History of falling: Secondary | ICD-10-CM | POA: Diagnosis not present

## 2015-12-18 DIAGNOSIS — I2510 Atherosclerotic heart disease of native coronary artery without angina pectoris: Secondary | ICD-10-CM | POA: Diagnosis not present

## 2015-12-18 DIAGNOSIS — Z7984 Long term (current) use of oral hypoglycemic drugs: Secondary | ICD-10-CM | POA: Diagnosis not present

## 2015-12-18 DIAGNOSIS — R4189 Other symptoms and signs involving cognitive functions and awareness: Secondary | ICD-10-CM | POA: Diagnosis not present

## 2015-12-18 DIAGNOSIS — M6281 Muscle weakness (generalized): Secondary | ICD-10-CM | POA: Diagnosis not present

## 2015-12-18 LAB — POCT INR: INR: 3.3

## 2016-01-03 ENCOUNTER — Ambulatory Visit (INDEPENDENT_AMBULATORY_CARE_PROVIDER_SITE_OTHER): Payer: Commercial Managed Care - HMO

## 2016-01-03 DIAGNOSIS — M6281 Muscle weakness (generalized): Secondary | ICD-10-CM | POA: Diagnosis not present

## 2016-01-03 DIAGNOSIS — I359 Nonrheumatic aortic valve disorder, unspecified: Secondary | ICD-10-CM

## 2016-01-03 DIAGNOSIS — R4189 Other symptoms and signs involving cognitive functions and awareness: Secondary | ICD-10-CM | POA: Diagnosis not present

## 2016-01-03 DIAGNOSIS — J449 Chronic obstructive pulmonary disease, unspecified: Secondary | ICD-10-CM | POA: Diagnosis not present

## 2016-01-03 DIAGNOSIS — Z7984 Long term (current) use of oral hypoglycemic drugs: Secondary | ICD-10-CM | POA: Diagnosis not present

## 2016-01-03 DIAGNOSIS — Z9181 History of falling: Secondary | ICD-10-CM | POA: Diagnosis not present

## 2016-01-03 DIAGNOSIS — I10 Essential (primary) hypertension: Secondary | ICD-10-CM | POA: Diagnosis not present

## 2016-01-03 DIAGNOSIS — I482 Chronic atrial fibrillation: Secondary | ICD-10-CM | POA: Diagnosis not present

## 2016-01-03 DIAGNOSIS — I2510 Atherosclerotic heart disease of native coronary artery without angina pectoris: Secondary | ICD-10-CM | POA: Diagnosis not present

## 2016-01-03 DIAGNOSIS — E1142 Type 2 diabetes mellitus with diabetic polyneuropathy: Secondary | ICD-10-CM | POA: Diagnosis not present

## 2016-01-03 LAB — POCT INR: INR: 2.2

## 2016-01-10 DIAGNOSIS — G4733 Obstructive sleep apnea (adult) (pediatric): Secondary | ICD-10-CM | POA: Diagnosis not present

## 2016-02-10 DIAGNOSIS — G4733 Obstructive sleep apnea (adult) (pediatric): Secondary | ICD-10-CM | POA: Diagnosis not present

## 2016-02-11 ENCOUNTER — Encounter (INDEPENDENT_AMBULATORY_CARE_PROVIDER_SITE_OTHER): Payer: Self-pay

## 2016-02-11 ENCOUNTER — Ambulatory Visit (INDEPENDENT_AMBULATORY_CARE_PROVIDER_SITE_OTHER): Payer: Commercial Managed Care - HMO | Admitting: Gastroenterology

## 2016-02-11 ENCOUNTER — Encounter: Payer: Self-pay | Admitting: Gastroenterology

## 2016-02-11 VITALS — BP 120/62 | HR 76 | Ht 68.0 in | Wt 252.8 lb

## 2016-02-11 DIAGNOSIS — K5900 Constipation, unspecified: Secondary | ICD-10-CM | POA: Diagnosis not present

## 2016-02-11 DIAGNOSIS — I482 Chronic atrial fibrillation, unspecified: Secondary | ICD-10-CM

## 2016-02-11 DIAGNOSIS — E118 Type 2 diabetes mellitus with unspecified complications: Secondary | ICD-10-CM

## 2016-02-11 DIAGNOSIS — I2510 Atherosclerotic heart disease of native coronary artery without angina pectoris: Secondary | ICD-10-CM

## 2016-02-11 DIAGNOSIS — G4733 Obstructive sleep apnea (adult) (pediatric): Secondary | ICD-10-CM

## 2016-02-11 DIAGNOSIS — I359 Nonrheumatic aortic valve disorder, unspecified: Secondary | ICD-10-CM

## 2016-02-11 DIAGNOSIS — Z7901 Long term (current) use of anticoagulants: Secondary | ICD-10-CM

## 2016-02-11 MED ORDER — NA SULFATE-K SULFATE-MG SULF 17.5-3.13-1.6 GM/177ML PO SOLN: 1.0000 | Freq: Once | ORAL | 0 refills | Status: AC

## 2016-02-11 NOTE — Patient Instructions (Addendum)
Instead of miralax, fiber and senna, for the next 2 weeks try samples of Linzess 72 micrograms once daily. We have given you a 16 days supply.  If you are age 73 or older, your body mass index should be between 23-30. Your Body mass index is 38.44 kg/m. If this is out of the aforementioned range listed, please consider follow up with your Primary Care Provider.  If you are age 15 or younger, your body mass index should be between 19-25. Your Body mass index is 38.44 kg/m. If this is out of the aformentioned range listed, please consider follow up with your Primary Care Provider.   You have been scheduled for a colonoscopy. Please follow written instructions given to you at your visit today.  Please pick up your prep supplies at the pharmacy within the next 1-3 days. If you use inhalers (even only as needed), please bring them with you on the day of your procedure. Your physician has requested that you go to www.startemmi.com and enter the access code given to you at your visit today. This web site gives a general overview about your procedure. However, you should still follow specific instructions given to you by our office regarding your preparation for the procedure.  We have sent the following medications to your pharmacy for you to pick up at your convenience: Suprep  Thank you for choosing [facility]  Dr Wilfrid Lund III

## 2016-02-11 NOTE — Progress Notes (Signed)
[facility] Consult Note:  History: Matthew Edwards 02/11/2016  Referring physician: Zigmund Gottron, MD  Reason for consult/chief complaint: Constipation (x years since starting heart medications; has to use laxatives to have bm; No blood noted; ) and Abdominal Pain ("extreme fullness")   Subjective  HPI:  Matthew Edwards was referred by primary care for chronic constipation and abdominal bloating. It has been apparently been going on several years since his CABG and AVR in 2012. His daughter attributes it to some of his medicines, but then says the problem was occurring even before the suspected medicines. He is a rather limited historian. It sounds like his appetite is variable and his weight stable overall. He has to use a combination of MiraLAX Senokot and fiber to have a regular BM, and even then does not feel completely evacuated. They believe he is due for a colonoscopy, and his primary care physician's last note says that he is due. However, no report of a colonoscopy can be found in the Epic system. At first, his daughter felt that it probably been done in the last 5-7 years, and then thought perhaps it hadn't been done for about 10-12 years. He denies rectal bleeding.  ROS:  Review of Systems  Constitutional: Negative for appetite change and unexpected weight change.  HENT: Negative for mouth sores and voice change.   Eyes: Negative for pain and redness.  Respiratory: Positive for shortness of breath. Negative for cough.        Dyspnea with exertion  Cardiovascular: Negative for chest pain and palpitations.  Genitourinary: Negative for dysuria and hematuria.  Musculoskeletal: Positive for arthralgias. Negative for myalgias.  Skin: Negative for pallor and rash.  Neurological: Negative for weakness and headaches.  Hematological: Negative for adenopathy.     Past Medical History: Past Medical History:  Diagnosis Date  . Atrial fibrillation ([place])   . CAD  (coronary artery disease) 2012   [place]-LAD  . COPD (chronic obstructive pulmonary disease) ([place])   . GERD (gastroesophageal reflux disease)   . HEART FAILURE, CONGESTIVE UNSPEC 02/23/2007   Qualifier: Diagnosis of  By: Mellody Drown MD, [facility]    . Hyperlipidemia   . Hypertension   . OSA (obstructive sleep apnea) 11/13/2014  . S/P AVR    #25 mm Edwards pericardial valve Bioprosthetic. Dr Cyndia Bent.  . Situational depression    "son passed 11/2013"  . Stented coronary artery 2013  . TOBACCO DEPENDENCE 07/29/2006   Qualifier: History of  By: Carlena Sax  MD, [facility]    . Type II diabetes mellitus ([place])    I reviewed his last cardiology note from October 2016.  Past Surgical History: Past Surgical History:  Procedure Laterality Date  . AORTIC VALVE REPLACEMENT  07/2010   notes 07/28/2010  . CARDIAC CATHETERIZATION  06/2010   Archie Endo 07/01/2010  . CORONARY ANGIOPLASTY WITH STENT PLACEMENT  2006; 10/2006   "2"; 1/notes 10/01/2010  . CORONARY ARTERY BYPASS GRAFT  07/2010   "1"/notes 07/28/2010     Family History: Family History  Problem Relation Age of Onset  . Heart disease Mother   . Colon cancer Neg Hx   . Esophageal cancer Neg Hx   . Stomach cancer Neg Hx   . Pancreatic cancer Neg Hx   . Liver disease Neg Hx     Social History: Social History   Social History  . Marital status: Legally Separated    Spouse name: N/A  . Number of children: 7  . Years of education: 2  Occupational History  . Retired-[employer]  . [employer]    Social History Main Topics  . Smoking status: Former Smoker    Packs/day: 0.50    Years: 46.00    Types: Cigarettes  . Smokeless tobacco: Never Used  . Alcohol use No  . Drug use: No  . Sexual activity: Not Asked   Other Topics Concern  . None   Social History Narrative   Health Care POA:    Emergency Contact: Daughter, [name]   End of Life Plan:    Who lives with you: self   Any pets: none   Diet: Patient has a varied diet of protein,  starch, and vegetables.   Exercise: Pt has not regular exercise routine.   Seatbelts: Pt reports wearing seatbelt when in vehile.   Nancy Fetter Exposure/Protection:    Hobbies: fishing, tv          Allergies: Allergies  Allergen Reactions  . Pineapple Concentrate Nausea And Vomiting    Outpatient Meds: Current Outpatient Prescriptions  Medication Sig Dispense Refill  . atorvastatin (LIPITOR) 40 MG tablet Take 1 tablet (40 mg total) by mouth daily at 6 PM. 90 tablet 3  . benazepril (LOTENSIN) 20 MG tablet Take 1 tablet (20 mg total) by mouth daily. 90 tablet 3  . Blood Glucose Monitoring Suppl (ACCU-CHEK AVIVA PLUS) W/DEVICE KIT 1 Units by Does not apply route 3 (three) times daily. 1 kit 0  . budesonide-formoterol (SYMBICORT) 80-4.5 MCG/ACT inhaler Inhale 2 puffs into the lungs 2 (two) times daily. 10 Inhaler 11  . Cholecalciferol (VITAMIN D-3 PO) Take 1,000 Units by mouth daily.    Marland Kitchen diltiazem (CARDIZEM CD) 120 MG 24 hr capsule Take 1 capsule (120 mg total) by mouth daily. 90 capsule 3  . furosemide (LASIX) 40 MG tablet Take 1 tablet (40 mg total) by mouth 2 (two) times daily. 90 tablet 3  . glipiZIDE (GLUCOTROL) 10 MG tablet Take 1 tablet by mouth at 5 PM with a meal. 90 tablet 3  . glucose blood (ACCU-CHEK AVIVA PLUS) test strip USE TO CHECK BLOOD SUGARS FOUR TIMES DAILY 400 each 3  . metoprolol succinate (TOPROL-XL) 25 MG 24 hr tablet Take 1 tablet (25 mg total) by mouth daily. Reported on 11/28/2015 90 tablet 3  . nitroGLYCERIN (NITROSTAT) 0.4 MG SL tablet Place 1 tablet (0.4 mg total) under the tongue every 5 (five) minutes as needed. For chest pain 25 tablet 6  . polycarbophil (FIBERCON) 625 MG tablet Take 1 tablet (625 mg total) by mouth daily. 90 tablet 3  . polyethylene glycol powder (GLYCOLAX/MIRALAX) powder Take 17 g by mouth 2 (two) times daily as needed. 3350 g 3  . potassium chloride SA (K-DUR,KLOR-CON) 20 MEQ tablet Take 1 tablet (20 mEq total) by mouth daily. 90 tablet 3  .  senna (SENOKOT) 8.6 MG TABS tablet Take 2 tablets (17.2 mg total) by mouth at bedtime. (Patient taking differently: Take 2 tablets by mouth at bedtime as needed. ) 180 each 3  . vitamin C (ASCORBIC ACID) 500 MG tablet Take 1 tablet (500 mg total) by mouth every morning. 90 tablet 3  . warfarin (COUMADIN) 5 MG tablet Take 1 tablet (5 mg total) by mouth daily. 90 tablet 3  . albuterol (PROVENTIL HFA;VENTOLIN HFA) 108 (90 Base) MCG/ACT inhaler Inhale 2 puffs into the lungs every 6 (six) hours as needed for wheezing or shortness of breath. (Patient not taking: Reported on 02/11/2016) 1 Inhaler 3  . metFORMIN (GLUCOPHAGE)  500 MG tablet Take 1 tablet (500 mg total) by mouth 2 (two) times daily with a meal. (Patient not taking: Reported on 02/11/2016) 180 tablet 3  . Na Sulfate-K Sulfate-Mg Sulf 17.5-3.13-1.6 GM/180ML SOLN Take 1 kit by mouth once. 354 mL 0   No current facility-administered medications for this visit.       ___________________________________________________________________ Objective   Exam:  BP 120/62   Pulse 76 Comment: irregular  Ht '5\' 8"'$  (1.727 m)   Wt 252 lb 12.8 oz (114.7 kg)   BMI 38.44 kg/m    General: this is a(n) Obese elderly man   Eyes: sclera anicteric, no redness  ENT: oral mucosa moist without lesions, no cervical or supraclavicular lymphadenopathy, good dentition  CV: Irregularly irregular without murmur, S1/S2, no JVD, 2+ pretibial edema bilaterally  Resp: clear to auscultation bilaterally, normal RR and effort noted  GI: soft, no tenderness, with active bowel sounds. No guarding or palpable organomegaly noted. Exam limited by abdominal girth  Skin; warm and dry, no rash or jaundice noted  Neuro: awake, alert and oriented x 3. Normal gross motor function and fluent speech  Lab:  INR 2.2 last month  CBC    Component Value Date/Time   WBC 8.1 09/24/2015   WBC 6.8 09/20/2015 0409   RBC 4.87 09/20/2015 0409   HGB 15.1 09/24/2015   HCT 47  09/24/2015   PLT 214 09/24/2015   MCV 90.1 09/20/2015 0409   MCH 29.0 09/20/2015 0409   MCHC 32.1 09/20/2015 0409   RDW 13.1 09/20/2015 0409   LYMPHSABS 1.7 12/16/2014 1447   MONOABS 0.5 12/16/2014 1447   EOSABS 0.1 12/16/2014 1447   BASOSABS 0.0 12/16/2014 1447     Assessment: Encounter Diagnoses  Name Primary?  . Constipation, unspecified constipation type Yes  . Atherosclerosis of native coronary artery of native heart without angina pectoris   . Chronic atrial fibrillation ([place])   . Aortic valve disorder   . OSA (obstructive sleep apnea)   . Type 2 diabetes mellitus with complication, without long-term current use of insulin ([place])   . Chronic anticoagulation     His constipation sounds most likely to be functional, combination of age-related decreased motility and physical and activity, perhaps exacerbated by side effects of calcium channel blocker and diuretics. It seems less likely to be obstructive. It appears to have been many years since his last colonoscopy.  Plan:  Colonoscopy to rule out neoplasia or other anatomical/structural cause for constipation.  The benefits and risks of the planned procedure were described in detail with the patient or (when appropriate) their health care proxy.  Risks were outlined as including, but not limited to, bleeding, infection, perforation, adverse medication reaction leading to cardiac or pulmonary decompensation, or pancreatitis (if ERCP).  The limitation of incomplete mucosal visualization was also discussed.  No guarantees or warranties were given.  He will need to be off Coumadin 4 days prior to procedure, we will send a message to his cardiologist.  Thank you for the courtesy of this consult.  Please call me with any questions or concerns.  Nelida Meuse III  CC: Zigmund Gottron, MD

## 2016-02-12 ENCOUNTER — Other Ambulatory Visit: Payer: Self-pay

## 2016-02-12 ENCOUNTER — Telehealth: Payer: Self-pay

## 2016-02-12 NOTE — Telephone Encounter (Signed)
  02/12/2016   RE: Matthew Edwards DOB: September 10, 1940 MRN: [id number]   Dear Dr Peter Martinique,    We have scheduled the above patient for an endoscopic procedure(colonoscopy). Our records show that he is on anticoagulation therapy.   Please advise as to how long the patient may come off his therapy of Coumadin prior to the procedure, which is scheduled for 03-13-2016.  Please fax back/ or route the completed form to Christell Constant at [phone].   Sincerely,   Dr Wilfrid Lund

## 2016-02-17 NOTE — Telephone Encounter (Signed)
Spoke to the patients daughter Armida Sans. She states she will make sure he holds his blood thinner 5 days prior to his 03-13-16 colonoscopy.

## 2016-02-17 NOTE — Telephone Encounter (Signed)
He may stop Coumadin for 5 days prior to colonoscopy.  Peter Martinique MD, [facility]

## 2016-03-03 ENCOUNTER — Encounter: Payer: Self-pay | Admitting: Student in an Organized Health Care Education/Training Program

## 2016-03-03 ENCOUNTER — Ambulatory Visit (INDEPENDENT_AMBULATORY_CARE_PROVIDER_SITE_OTHER): Payer: Commercial Managed Care - HMO | Admitting: Student in an Organized Health Care Education/Training Program

## 2016-03-03 ENCOUNTER — Telehealth: Payer: Self-pay | Admitting: Student in an Organized Health Care Education/Training Program

## 2016-03-03 VITALS — BP 130/93 | HR 55 | Temp 98.0°F | Ht 68.0 in | Wt 252.8 lb

## 2016-03-03 DIAGNOSIS — R42 Dizziness and giddiness: Secondary | ICD-10-CM | POA: Diagnosis not present

## 2016-03-03 DIAGNOSIS — R4182 Altered mental status, unspecified: Secondary | ICD-10-CM

## 2016-03-03 DIAGNOSIS — E119 Type 2 diabetes mellitus without complications: Secondary | ICD-10-CM | POA: Diagnosis not present

## 2016-03-03 DIAGNOSIS — E1065 Type 1 diabetes mellitus with hyperglycemia: Secondary | ICD-10-CM

## 2016-03-03 DIAGNOSIS — IMO0002 Reserved for concepts with insufficient information to code with codable children: Secondary | ICD-10-CM

## 2016-03-03 DIAGNOSIS — E108 Type 1 diabetes mellitus with unspecified complications: Secondary | ICD-10-CM | POA: Diagnosis not present

## 2016-03-03 LAB — CBC
HCT: 48.8 % (ref 39.0–52.0)
Hemoglobin: 15.6 g/dL (ref 13.0–17.0)
MCH: 29.2 pg (ref 26.0–34.0)
MCHC: 32 g/dL (ref 30.0–36.0)
MCV: 91.2 fL (ref 78.0–100.0)
Platelets: 133 K/uL — ABNORMAL LOW (ref 150–400)
RBC: 5.35 MIL/uL (ref 4.22–5.81)
RDW: 13.5 % (ref 11.5–15.5)
WBC: 7.8 K/uL (ref 4.0–10.5)

## 2016-03-03 LAB — BASIC METABOLIC PANEL WITH GFR
Anion gap: 6 (ref 5–15)
BUN: 17 mg/dL (ref 6–20)
CO2: 29 mmol/L (ref 22–32)
Calcium: 10.1 mg/dL (ref 8.9–10.3)
Chloride: 103 mmol/L (ref 101–111)
Creatinine, Ser: 1.23 mg/dL (ref 0.61–1.24)
GFR calc Af Amer: >60 mL/min
GFR calc non Af Amer: 57 mL/min — ABNORMAL LOW
Glucose, Bld: 151 mg/dL — ABNORMAL HIGH (ref 65–99)
Potassium: 5.2 mmol/L — ABNORMAL HIGH (ref 3.5–5.1)
Sodium: 138 mmol/L (ref 135–145)

## 2016-03-03 LAB — HEMOGLOBIN A1C
Hgb A1c MFr Bld: 8.1 % — ABNORMAL HIGH (ref 4.8–5.6)
Mean Plasma Glucose: 186 mg/dL

## 2016-03-03 LAB — GLUCOSE, CAPILLARY: Glucose-Capillary: 160 mg/dL — ABNORMAL HIGH (ref 65–99)

## 2016-03-03 NOTE — Progress Notes (Signed)
   CC: diarrhea  HPI: Matthew Edwards is a 75 y.o. male with PMH significant for HTN, diastolic HF, chronic Afib on coumadin, COPD, GERD, OSA, T2DM, who presents as a same day appointment to [facility] today with diarrhea of 2 days duration (a total of three episodes).  Patient has been taking linaclotide as well as mirilax and senakot in preparation for a bowel clean out for colonoscopy 10/13.  The patient endorses a BM that was all water, no blood this morning.  Dizziness was noted intermittently over past two days, worse with standing, feels like the room is going dark and he was seeing dark spots and he sways when he first stands.  Improves with sitting.  Has been drinking plenty of water.  He has had normal amount of urination.   No abdominal pain, fever, sick contacts, or vomiting.  He had one episode of slight confusion over the phone with his daughter in which he was answering questions appropriately but did not seem quite himself.    Patient was seen by [facility] Dr. Loletha Carrow on 9/12 for constipation which had not resolved with Miralax senakot and fiber, and a colonoscopy was scheduled for 03/13/2016.   Review of Symptoms: See HPI for ROS.   CC, SH/smoking status, and VS noted.  Objective: BP (!) 130/93   Pulse (!) 55   Temp 98 F (36.7 C) (Oral)   Ht 5\' 8"  (1.727 m)   Wt 252 lb 12.8 oz (114.7 kg)   BMI 38.44 kg/m  GEN: NAD, alert, cooperative, and pleasant. EYE: no conjunctival injection, pupils equally round and reactive to light ENMT: normal tympanic light reflex, no nasal polyps,no rhinorrhea, no pharyngeal erythema or exudates, mucous membranes dry NECK: full ROM, no thyromegally RESPIRATORY: clear to auscultation bilaterally with no wheezes, rhonchi or rales, good effort CV: RRR, no m/r/g, no peripheral edema GI: soft, non-tender, mildly distended he says consistent with last few months, normoactive bowel sounds, no hepatosplenomegaly SKIN: warm and dry, no rashes or lesions, cap  refill 2-3 seconds NEURO: II-XII grossly intact, normal gait, peripheral sensation intact, 5+ strength in UE LE bil PSYCH: AAOx3, appropriate affect, answers questions appropriately BG 160 BMP, CBC pending   Assessment and plan:  Dizziness Likely secondary to orthostatic hypotension in the setting of acute diarrhea.  Other diagnoses on the differential for dizziness with reported slight AMS from the daughter include infection (no fevers or recent illness), hypoglycemia (BG 160 in office), iatrogenic (patient is on several HTN medications), or neurological (normal neuro exam, mentating well in the office), also considered bleeding risk given chronic warfarin use (no signs of bleeding but will check CBC) - orthostatic blood pressures suggestive of orthostatic hypotension - ordered CBG, BMP for electrolyte abnormalities with diarrhea, CBC and will follow up results with daughter - recommend holding lasix while having diarrhea - recommend rehydrating with gatorade halved with water - recommend calling GI doctor if diarrhea persists for next 1-2 days to see if increased bowel regimen should be discontinued or readjusted - recommend checking BG if he becomes altered in the future - return precautions provided   Orders Placed This Encounter  Procedures  . Glucose, capillary  . BASIC METABOLIC PANEL WITH GFR  . CBC  . Basic Metabolic Panel  . Hemoglobin A1C    No orders of the defined types were placed in this encounter.    Everrett Coombe, MD,MS,  PGY1 03/03/2016 1:24 PM

## 2016-03-03 NOTE — Telephone Encounter (Signed)
Called to let the patient's daughter know the patient's blood test did not show any indication that there is bleeding causing her father's light headedness with hemoglobin normal at 15.6. Additionally, we had checked electrolytes for signs of potassium loss with diarrhea but the patient's potassium was just above normal at 5.2.   Most likely the episode of light-headedness was mild orthostatic hypotension, recommended keeping him hydrated with fluids by mouth and following up with the GI doctors to consider decreasing clean-out regimen if the diarrhea worsens or becomes more bothersome to the patient.  Return precautions provided.  The daughter voiced understanding.

## 2016-03-03 NOTE — Assessment & Plan Note (Addendum)
Likely secondary to orthostatic hypotension in the setting of acute diarrhea.  Other diagnoses on the differential for dizziness with reported slight AMS from the daughter include infection (no fevers or recent illness), hypoglycemia (BG 160 in office), iatrogenic (patient is on several HTN medications), or neurological (normal neuro exam, mentating well in the office), also considered bleeding risk given chronic warfarin use (no signs of bleeding but will check CBC) - orthostatic blood pressures suggestive of orthostatic hypotension - ordered CBG, BMP for electrolyte abnormalities with diarrhea, CBC and will follow up results with daughter - recommend holding lasix while having diarrhea - recommend rehydrating with gatorade halved with water - recommend calling GI doctor if diarrhea persists for next 1-2 days to see if increased bowel regimen should be discontinued or readjusted - recommend checking BG if he becomes altered in the future - return precautions provided

## 2016-03-03 NOTE — Patient Instructions (Addendum)
It was a pleasure seeing you today in our clinic. Today we discussed light headedness and diarrhea. Here is the treatment plan we have discussed and agreed upon together:  We tested blood work including your blood levels, electrolytes, and tested your sugar. I will call you when these results come back. - While you are having diarrhea, please do not take your lasix medication - If your diarrhea worsens or continues for 2 days, please reach out to your GI doctor about the medication for your bowel clean out and whether it should be continued. - If you become confused, please have your blood sugar tested.  - If your symptoms become significantly worse with many episodes of diarrhea, severe light headedness or confusion, or you are not urinating as frequently, these would be signs that you are becoming dehydrated and you should call us back or come in to be reevaluated.  Our clinic's number is [phone]. Please call with questions or concerns about what we discussed today.  - Dr. Burr Medico

## 2016-03-05 ENCOUNTER — Encounter: Payer: Self-pay | Admitting: Student in an Organized Health Care Education/Training Program

## 2016-03-06 ENCOUNTER — Encounter: Payer: Self-pay | Admitting: Gastroenterology

## 2016-03-13 ENCOUNTER — Ambulatory Visit (AMBULATORY_SURGERY_CENTER): Payer: Commercial Managed Care - HMO | Admitting: Gastroenterology

## 2016-03-13 ENCOUNTER — Encounter: Payer: Self-pay | Admitting: Gastroenterology

## 2016-03-13 VITALS — BP 142/74 | HR 105 | Temp 98.9°F | Resp 29 | Ht 68.0 in | Wt 252.0 lb

## 2016-03-13 DIAGNOSIS — I272 Pulmonary hypertension, unspecified: Secondary | ICD-10-CM | POA: Diagnosis not present

## 2016-03-13 DIAGNOSIS — K621 Rectal polyp: Secondary | ICD-10-CM

## 2016-03-13 DIAGNOSIS — K5900 Constipation, unspecified: Secondary | ICD-10-CM

## 2016-03-13 DIAGNOSIS — G4733 Obstructive sleep apnea (adult) (pediatric): Secondary | ICD-10-CM | POA: Diagnosis not present

## 2016-03-13 DIAGNOSIS — E119 Type 2 diabetes mellitus without complications: Secondary | ICD-10-CM | POA: Diagnosis not present

## 2016-03-13 DIAGNOSIS — D128 Benign neoplasm of rectum: Secondary | ICD-10-CM | POA: Diagnosis not present

## 2016-03-13 DIAGNOSIS — I509 Heart failure, unspecified: Secondary | ICD-10-CM | POA: Diagnosis not present

## 2016-03-13 LAB — GLUCOSE, CAPILLARY
Glucose-Capillary: 167 mg/dL — ABNORMAL HIGH (ref 65–99)
Glucose-Capillary: 147 mg/dL — ABNORMAL HIGH (ref 65–99)

## 2016-03-13 MED ORDER — SODIUM CHLORIDE 0.9 % IV SOLN: 500.0000 mL | INTRAVENOUS | Status: DC

## 2016-03-13 NOTE — Op Note (Signed)
[facility] Patient Name: Matthew Edwards Procedure Date: 03/13/2016 11:20 AM MRN: [id number] Endoscopist: Mallie Mussel L. Loletha Carrow , MD Age: 75 Referring MD:  Date of Birth: Aug 14, 1940 Gender: Male Account #: [id number] Procedure:                Colonoscopy Indications:              Constipation Medicines:                Monitored Anesthesia Care Procedure:                Pre-Anesthesia Assessment:                           - Prior to the procedure, a History and Physical                            was performed, and patient medications and                            allergies were reviewed. The patient's tolerance of                            previous anesthesia was also reviewed. The risks                            and benefits of the procedure and the sedation                            options and risks were discussed with the patient.                            All questions were answered, and informed consent                            was obtained. Prior Anticoagulants: The patient has                            taken Coumadin (warfarin), last dose was 4 days                            prior to procedure. ASA Grade Assessment: III - A                            patient with severe systemic disease. After                            reviewing the risks and benefits, the patient was                            deemed in satisfactory condition to undergo the                            procedure.  After obtaining informed consent, the colonoscope                            was passed under direct vision. Throughout the                            procedure, the patient's blood pressure, pulse, and                            oxygen saturations were monitored continuously. The                            Model CF-HQ190L [id number]) scope was introduced                            through the anus and advanced to the the cecum,   identified by appendiceal orifice and ileocecal                            valve. The colonoscopy was performed without                            difficulty. The patient tolerated the procedure                            well. The quality of the bowel preparation was                            good. The ileocecal valve, appendiceal orifice, and                            rectum were photographed. The quality of the bowel                            preparation was evaluated using the BBPS [facility]                            Bowel Preparation Scale) with scores of: Right                            Colon = 2, Transverse Colon = 3 and Left Colon = 2.                            The total BBPS score equals 7. The bowel                            preparation used was SUPREP. Scope In: 11:24:31 AM Scope Out: 11:43:00 AM Scope Withdrawal Time: 0 hours 12 minutes 41 seconds  Total Procedure Duration: 0 hours 18 minutes 29 seconds  Findings:                 The digital rectal exam findings include palpable  lesion on left lateral aspect of anal verge.                           A 4 mm polyp was found in the rectum. The polyp was                            sessile. The polyp was removed with a hot snare.                            Resection and retrieval were complete.                           A 12 mm polyp was found in the rectum. The polyp                            was sessile, benign-appearing and very close to the                            dentate line, adjacent to and appearing to overly                            large internal hemorrhoids. The polyp was biopsied                            with a cold forceps for histology.                           Internal hemorrhoids were found during retroflexion                            and during anoscopy. The hemorrhoids were large and                            Grade I (internal hemorrhoids that do not prolapse).                            The exam was otherwise without abnormality. Complications:            No immediate complications. Estimated Blood Loss:     Estimated blood loss: none. Impression:               - Palpable lesion on left lateral aspect of anal                            verge found on digital rectal exam.                           - One 4 mm polyp in the rectum, removed with a hot                            snare. Resected and retrieved.                           -  One 12 mm polyp in the rectum. Biopsied.                           - Internal hemorrhoids.                           - The examination was otherwise normal.                           The patient's constipation appears to be decreased                            motility from a combination of age, diet, physical                            inactivity and side effect of calcium channel                            blocker and diuretic medication. Recommendation:           - Patient has a contact number available for                            emergencies. The signs and symptoms of potential                            delayed complications were discussed with the                            patient. Return to normal activities tomorrow.                            Written discharge instructions were provided to the                            patient.                           - Resume previous diet.                           - Continue present medications, including current                            bowel regimen.                           - Await pathology results.                           - Resume Coumadin (warfarin) at prior dose in 3                            days.                           - After pathology report available, refer patient  to general surgery for trans-anal excision of                            distal rectal polyp.                           - Repeat colonoscopy is recommended for                             surveillance. The colonoscopy date will be                            determined after pathology results from today's                            exam become available for review. Henry L. Loletha Carrow, MD 03/13/2016 11:54:05 AM This report has been signed electronically.

## 2016-03-13 NOTE — Patient Instructions (Signed)
YOU HAD AN ENDOSCOPIC PROCEDURE TODAY AT [facility]:   Refer to the procedure report that was given to you for any specific questions about what was found during the examination.  If the procedure report does not answer your questions, please call your gastroenterologist to clarify.  If you requested that your care partner not be given the details of your procedure findings, then the procedure report has been included in a sealed envelope for you to review at your convenience later.  YOU SHOULD EXPECT: Some feelings of bloating in the abdomen. Passage of more gas than usual.  Walking can help get rid of the air that was put into your GI tract during the procedure and reduce the bloating. If you had a lower endoscopy (such as a colonoscopy or flexible sigmoidoscopy) you may notice spotting of blood in your stool or on the toilet paper. If you underwent a bowel prep for your procedure, you may not have a normal bowel movement for a few days.  Please Note:  You might notice some irritation and congestion in your nose or some drainage.  This is from the oxygen used during your procedure.  There is no need for concern and it should clear up in a day or so.  SYMPTOMS TO REPORT IMMEDIATELY:   Following lower endoscopy (colonoscopy or flexible sigmoidoscopy):  Excessive amounts of blood in the stool  Significant tenderness or worsening of abdominal pains  Swelling of the abdomen that is new, acute  Fever of 100F or higher   Following upper endoscopy (EGD)  Vomiting of blood or coffee ground material  New chest pain or pain under the shoulder blades  Painful or persistently difficult swallowing  New shortness of breath  Fever of 100F or higher  Black, tarry-looking stools  For urgent or emergent issues, a gastroenterologist can be reached at any hour by calling [phone].   DIET:  We do recommend a small meal at first, but then you may proceed to your regular diet.  Drink  plenty of fluids but you should avoid alcoholic beverages for 24 hours.  ACTIVITY:  You should plan to take it easy for the rest of today and you should NOT DRIVE or use heavy machinery until tomorrow (because of the sedation medicines used during the test).    FOLLOW UP: Our staff will call the number listed on your records the next business day following your procedure to check on you and address any questions or concerns that you may have regarding the information given to you following your procedure. If we do not reach you, we will leave a message.  However, if you are feeling well and you are not experiencing any problems, there is no need to return our call.  We will assume that you have returned to your regular daily activities without incident.  If any biopsies were taken you will be contacted by phone or by letter within the next 1-3 weeks.  Please call us at [phone] if you have not heard about the biopsies in 3 weeks.    SIGNATURES/CONFIDENTIALITY: You and/or your care partner have signed paperwork which will be entered into your electronic medical record.  These signatures attest to the fact that that the information above on your After Visit Summary has been reviewed and is understood.  Full responsibility of the confidentiality of this discharge information lies with you and/or your care-partner.  Hand out on polyps, hemorrhoids  Resume the coumadin in 3  days - start Tuesday 03-17-2016  You may resume all other medications today

## 2016-03-13 NOTE — Progress Notes (Signed)
Called to room to assist during endoscopic procedure.  Patient ID and intended procedure confirmed with present staff. Received instructions for my participation in the procedure from the performing physician.  

## 2016-03-16 ENCOUNTER — Telehealth: Payer: Self-pay | Admitting: *Deleted

## 2016-03-16 NOTE — Telephone Encounter (Signed)
  Follow up Call-  Call back number 03/13/2016  Post procedure Call Back phone  # [phone] dedra    [place] [phone]  Permission to leave phone message Yes  Some recent data might be hidden    Spoke with daughter- all questions answered Patient questions:  Do you have a fever, pain , or abdominal swelling? No. Pain Score  0 *  Have you tolerated food without any problems? Yes.    Have you been able to return to your normal activities? Yes.    Do you have any questions about your discharge instructions: Diet   No. Medications  No. Follow up visit  Yes.    Do you have questions or concerns about your Care? No.  Actions: * If pain score is 4 or above: No action needed, pain <4.

## 2016-03-27 NOTE — Progress Notes (Signed)
Letter mailed 10/27.jf

## 2016-04-07 ENCOUNTER — Telehealth: Payer: Self-pay

## 2016-04-07 NOTE — Telephone Encounter (Signed)
   Please document an effort to reach one of his children and convey that he MUST see the surgeon. I stressed this to his son the day of the procedure.  The polyp has high grade dysplasia, which means that it will become rectal cancer if not removed surgically.  Then please send the patient the letter I have dictated into the Communications section of the chart.

## 2016-04-07 NOTE — Telephone Encounter (Signed)
Called [facility], they had scheduled patient to come in for consult on 03/30/16. Scheduler let me know that patient NO SHOWED for his appointment. Patient and/or family have been difficult to reach, I had previously mailed him recommendations to consult with [facility].

## 2016-04-16 ENCOUNTER — Ambulatory Visit (INDEPENDENT_AMBULATORY_CARE_PROVIDER_SITE_OTHER): Payer: Commercial Managed Care - HMO | Admitting: Obstetrics and Gynecology

## 2016-04-16 ENCOUNTER — Encounter: Payer: Self-pay | Admitting: Obstetrics and Gynecology

## 2016-04-16 VITALS — BP 147/98 | HR 60 | Temp 98.3°F | Wt 251.0 lb

## 2016-04-16 DIAGNOSIS — I2510 Atherosclerotic heart disease of native coronary artery without angina pectoris: Secondary | ICD-10-CM

## 2016-04-16 DIAGNOSIS — E0843 Diabetes mellitus due to underlying condition with diabetic autonomic (poly)neuropathy: Secondary | ICD-10-CM

## 2016-04-16 DIAGNOSIS — I482 Chronic atrial fibrillation, unspecified: Secondary | ICD-10-CM

## 2016-04-16 DIAGNOSIS — R4189 Other symptoms and signs involving cognitive functions and awareness: Secondary | ICD-10-CM

## 2016-04-16 DIAGNOSIS — I10 Essential (primary) hypertension: Secondary | ICD-10-CM

## 2016-04-16 LAB — POCT INR: INR: 2

## 2016-04-16 MED ORDER — ATORVASTATIN CALCIUM 40 MG PO TABS: 40.0000 mg | ORAL_TABLET | Freq: Every day | ORAL | 0 refills | Status: DC

## 2016-04-16 MED ORDER — BENAZEPRIL HCL 20 MG PO TABS
20.0000 mg | ORAL_TABLET | Freq: Every day | ORAL | 0 refills | Status: DC
Start: 2016-04-16 — End: 2017-02-03

## 2016-04-16 MED ORDER — WARFARIN SODIUM 5 MG PO TABS: 5.5000 mg | ORAL_TABLET | Freq: Every day | ORAL | 0 refills | Status: DC

## 2016-04-16 MED ORDER — GLIPIZIDE 10 MG PO TABS: ORAL_TABLET | ORAL | 0 refills | Status: DC

## 2016-04-16 MED ORDER — METOPROLOL SUCCINATE ER 25 MG PO TB24: 25.0000 mg | ORAL_TABLET | Freq: Every day | ORAL | 0 refills | Status: DC

## 2016-04-16 NOTE — Progress Notes (Signed)
     Subjective: Chief Complaint  Patient presents with  . home health evaluation    would like [facility], angela aikman     HPI: NASIAH LEHENBAUER is a 75 y.o. presenting to clinic today to discuss the following:  #Home Health Daughter who presents with patient is requesting home health care services for her father She states that he lives alone and is unable to take care of himself He needs help with bathing, meal prepping, cleaning the house, and taking his medictions Able to perform all ADLs but needs assistance H/o dementia  Postoperativecheck on data unable Main concern is patient is to take his medications. Is on Coumadin.  #Hypertension Blood pressure at home: elevated Blood pressure today: elevated Taking Meds: Intermittent. Patient is to take medications. He states that the believes he took his a.m. medications. Side effects: none ROS: Denies headache, dizziness, visual changes, nausea, vomiting, chest pain, abdominal pain or shortness of breath.  #A.fib On comudin Follows with cardiology Does not take consistently Needs INR checked  #Needs medications refilled with 90 day supply  ROS noted in HPI.  Past Medical, Surgical, Social, and Family History Reviewed & Updated per EMR. Smoking status - Former smoker   Objective: BP (!) 147/98   Pulse 60   Temp 98.3 F (36.8 C) (Oral)   Wt 251 lb (113.9 kg)   BMI 38.16 kg/m  Vitals and nursing notes reviewed  Physical Exam  Constitutional: He is oriented to person, place, and time and well-developed, well-nourished, and in no distress.  Cardiovascular: Normal rate.   Pulmonary/Chest: Effort normal and breath sounds normal.  Musculoskeletal: Normal range of motion.  Neurological: He is alert and oriented to person, place, and time.  Psychiatric: Mood and affect normal.   Mini-cog 5/10  INR - 2.0  Assessment/Plan: Please see problem based Assessment and Plan   Orders Placed This Encounter  Procedures    . Ambulatory referral to Home Health    Referral Priority:   Routine    Referral Type:   Home Health Care    Referral Reason:   Specialty Services Required    Requested Specialty:   [facility]    Number of Visits Requested:   1  . POCT INR    Meds ordered this encounter  Medications  . atorvastatin (LIPITOR) 40 MG tablet    Sig: Take 1 tablet (40 mg total) by mouth daily at 6 PM.    Dispense:  90 tablet    Refill:  0  . metoprolol succinate (TOPROL-XL) 25 MG 24 hr tablet    Sig: Take 1 tablet (25 mg total) by mouth daily. Reported on 11/28/2015    Dispense:  90 tablet    Refill:  0  . warfarin (COUMADIN) 5 MG tablet    Sig: Take 1 tablet (5 mg total) by mouth daily.    Dispense:  90 tablet    Refill:  0  . glipiZIDE (GLUCOTROL) 10 MG tablet    Sig: Take 1 tablet by mouth at 5 PM with a meal.    Dispense:  90 tablet    Refill:  0  . benazepril (LOTENSIN) 20 MG tablet    Sig: Take 1 tablet (20 mg total) by mouth daily.    Dispense:  90 tablet    Refill:  0     Luiz Blare, DO 04/16/2016, 1:43 PM PGY-3, [place]

## 2016-04-16 NOTE — Patient Instructions (Signed)
Home health RN and Aide placed. Again Medicare does not pay for personal care services.   Please follow-up with PCP on chronic disease states and medication refills.   Refills given on medications until PCP follow-up

## 2016-04-20 ENCOUNTER — Ambulatory Visit: Payer: Self-pay | Admitting: Surgery

## 2016-04-20 DIAGNOSIS — Z01818 Encounter for other preprocedural examination: Secondary | ICD-10-CM | POA: Diagnosis not present

## 2016-04-20 DIAGNOSIS — D128 Benign neoplasm of rectum: Secondary | ICD-10-CM | POA: Diagnosis not present

## 2016-04-20 DIAGNOSIS — K5909 Other constipation: Secondary | ICD-10-CM | POA: Diagnosis not present

## 2016-04-20 DIAGNOSIS — Z7901 Long term (current) use of anticoagulants: Secondary | ICD-10-CM | POA: Diagnosis not present

## 2016-04-20 NOTE — Assessment & Plan Note (Signed)
H/o dementia. Minis-cog today with score of 5. Needs assistance with medication and iADLs and some ADLs. HH orders placed. Patient with medicare so [facility] Aide for personal care usually not covered. Lives alone. May need to consider assisted living or moving in with daughter as patient becoming unsafe to live alone. Needs full dementia work-up. Will leave to PCP.

## 2016-04-20 NOTE — Assessment & Plan Note (Signed)
Elevated today but came down on recheck. Non-compliant with medications. Put in [facility] RN to help with medication management. Continue current meds. Follow-up with PCP.

## 2016-04-20 NOTE — Assessment & Plan Note (Signed)
Medications refilled

## 2016-04-20 NOTE — H&P (Signed)
Sim E. Esbenshade 04/20/2016 3:39 PM Location: [facility] Patient #: [id number] DOB: Oct 21, 1940 Widowed / Language: Matthew Edwards / Race: Black or African American Male  History of Present Illness Matthew Hector MD; 04/20/2016 5:20 PM) The patient is a 75 year old male who presents with a colorectal polyp. Note for "Colorectal polyp": Patient sent for surgical consultation at the request of Dr. Loletha Carrow with [facility] gastroenterology. Concern for distal rectal polyp with high-grade dysplasia.  Pleasant elderly gentleman. Comes today with his daughter. He has issues with some memory loss. Relies on her for help. I believe they have healthcare power of attorney. Patient underwent screening colonoscopy for constipation and 10 year follow-up. Found to have some polyps removed. There was more distal atypical polyp closer to the anal verge. Biopsies showed adenomatous polyp with high-grade dysplasia. Surgical consultation requested. Patient's daughter thought maybe he had been a hemorrhoid but biopsies were concerning. Patient struggles with constipation. FiberCon doesn't seem to help soften needs MiraLAX. That usually doesn't track. He does have some chronic cardiac issues and is anticoagulated on warfarin. Diabetic. Glucoses usually 120s. He thinks he had hernia surgery one point but otherwise no major surgery. Sees [name] for his acute on chronic heart failure and atrial fibrillation. Some occasional blood when he wipes but no major rectal bleeding.  No personal nor family history of GI/colon cancer, inflammatory bowel disease, irritable bowel syndrome, allergy such as Celiac Sprue, dietary/dairy problems, colitis, ulcers nor gastritis. No recent sick contacts/gastroenteritis. No travel outside the country. No changes in diet. No dysphagia to solids or liquids. No significant heartburn or reflux. No hematochezia, hematemesis, coffee ground emesis. No evidence of prior  gastric/peptic ulceration. The patient thinks that he's had polyps in his family. No colorectal cancer.   Other Problems Marjean Donna, [place]; 04/20/2016 3:39 PM) Diabetes Mellitus Hemorrhoids High blood pressure  Past Surgical History Marjean Donna, [place]; 04/20/2016 3:39 PM) Coronary Artery Bypass Graft Valve Replacement  Diagnostic Studies History Marjean Donna, CMA; 04/20/2016 3:39 PM) Colonoscopy within last year  Allergies ([place], [place]; 04/20/2016 3:41 PM) No Known Drug Allergies 04/20/2016  Medication History (Sonya Bynum, CMA; 04/20/2016 3:43 PM) Atorvastatin Calcium (40MG  Tablet, Oral) Active. Benazepril HCl (20MG  Tablet, Oral) Active. Potassium Chloride Crys ER (20MEQ Tablet ER, Oral) Active. Warfarin Sodium (5MG  Tablet, Oral) Active. MiraLax (Oral) Active. Symbicort (160-4.5MCG/ACT Aerosol, Inhalation) Active. GlipiZIDE (10MG  Tablet, Oral) Active. Medications Reconciled  Social History ([place]; 04/20/2016 3:39 PM) Alcohol use Occasional alcohol use. Caffeine use Coffee. No drug use  Family History ([place]; 04/20/2016 3:39 PM) Colon Polyps Mother. Diabetes Mellitus Mother. Hypertension Mother.     Review of Systems ([place]; 04/20/2016 3:40 PM) General Present- Weight Gain. Not Present- Appetite Loss, Chills, Fatigue, Fever, Night Sweats and Weight Loss. Skin Present- Dryness. Not Present- Change in Wart/Mole, Hives, Jaundice, New Lesions, Non-Healing Wounds, Rash and Ulcer. HEENT Not Present- Earache, Hearing Loss, Hoarseness, Nose Bleed, Oral Ulcers, Ringing in the Ears, Seasonal Allergies, Sinus Pain, Sore Throat, Visual Disturbances, Wears glasses/contact lenses and Yellow Eyes. Respiratory Present- Chronic Cough, Snoring and Wheezing. Not Present- Bloody sputum and Difficulty Breathing. Breast Not Present- Breast Mass, Breast Pain, Nipple Discharge and Skin Changes. Gastrointestinal Present- Bloating,  Constipation, Gets full quickly at meals and Hemorrhoids. Not Present- Abdominal Pain, Bloody Stool, Change in Bowel Habits, Chronic diarrhea, Difficulty Swallowing, Excessive gas, Indigestion, Nausea, Rectal Pain and Vomiting. Male Genitourinary Not Present- Blood in Urine, Change in Urinary Stream, Frequency, Impotence, Nocturia, Painful Urination,  Urgency and Urine Leakage.  Vitals (Sonya Bynum CMA; 04/20/2016 3:40 PM) 04/20/2016 3:40 PM Weight: 254 lb Height: 69in Body Surface Area: 2.29 m Body Mass Index: 37.51 kg/m  Pulse: 79 (Regular)  BP: 126/82 (Sitting, Left Arm, Standard)      Physical Exam Matthew Hector MD; 04/20/2016 4:02 PM)  General Mental Status-Alert. General Appearance-Not in acute distress, Not Sickly. Orientation-Oriented X3. Hydration-Well hydrated. Voice-Normal.  Integumentary Global Assessment Upon inspection and palpation of skin surfaces of the - Axillae: non-tender, no inflammation or ulceration, no drainage. and Distribution of scalp and body hair is normal. General Characteristics Temperature - normal warmth is noted.  Head and Neck Head-normocephalic, atraumatic with no lesions or palpable masses. Face Global Assessment - atraumatic, no absence of expression. Neck Global Assessment - no abnormal movements, no bruit auscultated on the right, no bruit auscultated on the left, no decreased range of motion, non-tender. Trachea-midline. Thyroid Gland Characteristics - non-tender.  Eye Eyeball - Left-Extraocular movements intact, No Nystagmus. Eyeball - Right-Extraocular movements intact, No Nystagmus. Cornea - Left-No Hazy. Cornea - Right-No Hazy. Sclera/Conjunctiva - Left-No scleral icterus, No Discharge. Sclera/Conjunctiva - Right-No scleral icterus, No Discharge. Pupil - Left-Direct reaction to light normal. Pupil - Right-Direct reaction to light normal.  ENMT Ears Pinna - Left - no drainage  observed, no generalized tenderness observed. Right - no drainage observed, no generalized tenderness observed. Nose and Sinuses External Inspection of the Nose - no destructive lesion observed. Inspection of the nares - Left - quiet respiration. Right - quiet respiration. Mouth and Throat Lips - Upper Lip - no fissures observed, no pallor noted. Lower Lip - no fissures observed, no pallor noted. Nasopharynx - no discharge present. Oral Cavity/Oropharynx - Tongue - no dryness observed. Oral Mucosa - no cyanosis observed. Hypopharynx - no evidence of airway distress observed.  Chest and Lung Exam Inspection Movements - Normal and Symmetrical. Accessory muscles - No use of accessory muscles in breathing. Palpation Palpation of the chest reveals - Non-tender. Auscultation Breath sounds - Normal and Clear.  Cardiovascular Auscultation Rhythm - Regular. Murmurs & Other Heart Sounds - Auscultation of the heart reveals - No Murmurs and No Systolic Clicks.  Abdomen Inspection Inspection of the abdomen reveals - No Visible peristalsis and No Abnormal pulsations. Umbilicus - No Bleeding, No Urine drainage. Palpation/Percussion Palpation and Percussion of the abdomen reveal - Soft, Non Tender, No Rebound tenderness, No Rigidity (guarding) and No Cutaneous hyperesthesia. Note: Morbidly obese but soft. Nontender, nondistended. No guarding. No umbilical no other hernias  Male Genitourinary Sexual Maturity Tanner 5 - Adult hair pattern and Adult penile size and shape. Note: No inguinal hernias. Normal external genitalia. Epididymi, testes, and spermatic cords normal without any masses.  Rectal Note: Pedunculated polyp about 2 cm from the anal verge. Left lateral wall. Friable.   Exam done with assistance of male Medical Assistant in the room. Perianal skin clean with good hygiene. No pruritis ani. No pilonidal disease. No fissure. No abscess/fistula. Normal sphincter tone. Tolerates digital  and anoscopic rectal exam. No other rectal masses to 5cm. No external hemorrhoids.  Peripheral Vascular Upper Extremity Inspection - Left - No Cyanotic nailbeds, Not Ischemic. Right - No Cyanotic nailbeds, Not Ischemic.  Neurologic Neurologic evaluation reveals -normal attention span and ability to concentrate, able to name objects and repeat phrases. Appropriate fund of knowledge , normal sensation and normal coordination. Mental Status Affect - not angry, not paranoid. Cranial Nerves-Normal Bilaterally. Gait-Normal. Note: Poor recall.  Neuropsychiatric Mental status exam  performed with findings of-able to articulate well with normal speech/language, rate, volume and coherence, thought content normal with ability to perform basic computations and apply abstract reasoning and no evidence of hallucinations, delusions, obsessions or homicidal/suicidal ideation.  Musculoskeletal Global Assessment Spine, Ribs and Pelvis - no instability, subluxation or laxity. Right Upper Extremity - no instability, subluxation or laxity.  Lymphatic Head & Neck  General Head & Neck Lymphatics: Bilateral - Description - No Localized lymphadenopathy. Axillary  General Axillary Region: Bilateral - Description - No Localized lymphadenopathy. Femoral & Inguinal  Generalized Femoral & Inguinal Lymphatics: Left - Description - No Localized lymphadenopathy. Right - Description - No Localized lymphadenopathy.    Assessment & Plan Matthew Hector MD; 04/20/2016 4:16 PM)  ADENOMATOUS RECTAL POLYP (D12.8) Impression: Left lateral distal rectal polyp with high-grade dysplasia. Standard of care would be removal.  Would plan TEM resection. Left side down decubitus positioning. An outpatient surgery. This would be like a super hemorrhoidectomy to him. May need to stay overnight if there are cardiopulmonary concerns  Would like to make sure it's okay cardiac standpoint proceed with surgery. Most likely  [name] will okay and allow the patient to come off warfarin. He will double check.  Current Plans Pt Education - [facility] TEM Education (Jakki Doughty): discussed with patient and provided information. PREOP COLON - ENCOUNTER FOR PREOPERATIVE EXAMINATION FOR GENERAL SURGICAL PROCEDURE (Z01.818)  Current Plans You are being scheduled for surgery - Our schedulers will call you.  You should hear from our office's scheduling department within 5 working days about the location, date, and time of surgery. We try to make accommodations for patient's preferences in scheduling surgery, but sometimes the OR schedule or the surgeon's schedule prevents [place] from making those accommodations.  If you have not heard from our office [phone]) in 5 working days, call the office and ask for your surgeon's nurse.  If you have other questions about your diagnosis, plan, or surgery, call the office and ask for your surgeon's nurse.  Written instructions provided Pt Education - [facility] Colon Bowel Prep 2015 Miralax/Antibiotics Started Neomycin Sulfate 500MG , 2 (two) Tablet SEE NOTE, #6, 04/20/2016, No Refill. Local Order: TAKE TWO TABLETS AT 2 PM, 3 PM, AND 10 PM THE DAY PRIOR TO SURGERY Started Flagyl 500MG , 2 (two) Tablet SEE NOTE, #6, 04/20/2016, No Refill. Local Order: Take at 2pm, 3pm, and 10pm the day prior to your colon operation Pt Education - [facility] Colectomy post-op instructions: discussed with patient and provided information. Pt Education - [facility] Good Bowel Health (Hero Mccathern) Pt Education - [facility] Pain Control (Rhylynn Perdomo) Pt Education - Pamphlet Given - Laparoscopic Colorectal Surgery: discussed with patient and provided information. CHRONIC CONSTIPATION (K59.09) Impression: I recommend he increase his fiber a switched MiraLAX. Adjust dose until moving his bowels once a day.  Current Plans Pt Education - [facility] Constipation (AT) Pt Education - [facility] Good Bowel Health (Kyuss Hale) ANTICOAGULATED (Z79.01) Impression: Would like to  make sure it safe to come off anticoagulation. He takes warfarin.  Current Plans I recommended obtaining preoperative cardiac clearance. I am concerned about the health of the patient and the ability to tolerate the operation. Therefore, we will request clearance by cardiology to better assess operative risk & see if a reevaluation, further workup, etc is needed. Also recommendations on how medications such as for anticoagulation and blood pressure should be managed/held/restarted after surgery. Advised patient to stop ASA, anticoagulant, blood thinners, and NSAIDs Three (3) days prior to surgery.  Matthew Edwards, M.D.,  F.A.C.S. Gastrointestinal and Minimally Invasive Surgery [facility] [address] [place], [place] [address] [phone] Main / Paging

## 2016-04-20 NOTE — Assessment & Plan Note (Signed)
Continue coumodin. Follows with Cards. INR today in range at 2.0. Masy consider switching anticoagulation in future since patient is non-compliant.

## 2016-04-21 ENCOUNTER — Telehealth: Payer: Self-pay

## 2016-04-21 NOTE — Telephone Encounter (Signed)
Received surgical clearance from [facility].Called patient no answer.Left message on personal voice mail to call me.Advised he will need appointment for surgical clearance.Last office visit with Dr.Jordan 03/19/15.[facility] called left message with triage nurse patient will need to schedule appointment before clearing for surgery.

## 2016-04-25 DIAGNOSIS — I2510 Atherosclerotic heart disease of native coronary artery without angina pectoris: Secondary | ICD-10-CM | POA: Diagnosis not present

## 2016-04-25 DIAGNOSIS — I482 Chronic atrial fibrillation: Secondary | ICD-10-CM | POA: Diagnosis not present

## 2016-04-25 DIAGNOSIS — I110 Hypertensive heart disease with heart failure: Secondary | ICD-10-CM | POA: Diagnosis not present

## 2016-04-25 DIAGNOSIS — J449 Chronic obstructive pulmonary disease, unspecified: Secondary | ICD-10-CM | POA: Diagnosis not present

## 2016-04-25 DIAGNOSIS — G3184 Mild cognitive impairment, so stated: Secondary | ICD-10-CM | POA: Diagnosis not present

## 2016-04-25 DIAGNOSIS — E1140 Type 2 diabetes mellitus with diabetic neuropathy, unspecified: Secondary | ICD-10-CM | POA: Diagnosis not present

## 2016-04-25 DIAGNOSIS — I5032 Chronic diastolic (congestive) heart failure: Secondary | ICD-10-CM | POA: Diagnosis not present

## 2016-04-25 DIAGNOSIS — M48061 Spinal stenosis, lumbar region without neurogenic claudication: Secondary | ICD-10-CM | POA: Diagnosis not present

## 2016-04-25 DIAGNOSIS — I272 Pulmonary hypertension, unspecified: Secondary | ICD-10-CM | POA: Diagnosis not present

## 2016-04-27 DIAGNOSIS — J449 Chronic obstructive pulmonary disease, unspecified: Secondary | ICD-10-CM | POA: Diagnosis not present

## 2016-04-27 DIAGNOSIS — E1140 Type 2 diabetes mellitus with diabetic neuropathy, unspecified: Secondary | ICD-10-CM | POA: Diagnosis not present

## 2016-04-27 DIAGNOSIS — I5032 Chronic diastolic (congestive) heart failure: Secondary | ICD-10-CM | POA: Diagnosis not present

## 2016-04-27 DIAGNOSIS — G3184 Mild cognitive impairment, so stated: Secondary | ICD-10-CM | POA: Diagnosis not present

## 2016-04-27 DIAGNOSIS — M48061 Spinal stenosis, lumbar region without neurogenic claudication: Secondary | ICD-10-CM | POA: Diagnosis not present

## 2016-04-27 DIAGNOSIS — I2510 Atherosclerotic heart disease of native coronary artery without angina pectoris: Secondary | ICD-10-CM | POA: Diagnosis not present

## 2016-04-27 DIAGNOSIS — I110 Hypertensive heart disease with heart failure: Secondary | ICD-10-CM | POA: Diagnosis not present

## 2016-04-27 DIAGNOSIS — I482 Chronic atrial fibrillation: Secondary | ICD-10-CM | POA: Diagnosis not present

## 2016-04-27 DIAGNOSIS — I272 Pulmonary hypertension, unspecified: Secondary | ICD-10-CM | POA: Diagnosis not present

## 2016-04-28 DIAGNOSIS — I272 Pulmonary hypertension, unspecified: Secondary | ICD-10-CM | POA: Diagnosis not present

## 2016-04-28 DIAGNOSIS — G3184 Mild cognitive impairment, so stated: Secondary | ICD-10-CM | POA: Diagnosis not present

## 2016-04-28 DIAGNOSIS — M48061 Spinal stenosis, lumbar region without neurogenic claudication: Secondary | ICD-10-CM | POA: Diagnosis not present

## 2016-04-28 DIAGNOSIS — I2510 Atherosclerotic heart disease of native coronary artery without angina pectoris: Secondary | ICD-10-CM | POA: Diagnosis not present

## 2016-04-28 DIAGNOSIS — I5032 Chronic diastolic (congestive) heart failure: Secondary | ICD-10-CM | POA: Diagnosis not present

## 2016-04-28 DIAGNOSIS — J449 Chronic obstructive pulmonary disease, unspecified: Secondary | ICD-10-CM | POA: Diagnosis not present

## 2016-04-28 DIAGNOSIS — I482 Chronic atrial fibrillation: Secondary | ICD-10-CM | POA: Diagnosis not present

## 2016-04-28 DIAGNOSIS — I110 Hypertensive heart disease with heart failure: Secondary | ICD-10-CM | POA: Diagnosis not present

## 2016-04-28 DIAGNOSIS — E1140 Type 2 diabetes mellitus with diabetic neuropathy, unspecified: Secondary | ICD-10-CM | POA: Diagnosis not present

## 2016-04-29 ENCOUNTER — Ambulatory Visit (INDEPENDENT_AMBULATORY_CARE_PROVIDER_SITE_OTHER): Payer: Commercial Managed Care - HMO | Admitting: Pharmacist

## 2016-04-29 ENCOUNTER — Encounter: Payer: Self-pay | Admitting: Physician Assistant

## 2016-04-29 ENCOUNTER — Ambulatory Visit (INDEPENDENT_AMBULATORY_CARE_PROVIDER_SITE_OTHER): Payer: Commercial Managed Care - HMO | Admitting: Physician Assistant

## 2016-04-29 VITALS — BP 132/70 | HR 76 | Ht 69.0 in | Wt 253.0 lb

## 2016-04-29 DIAGNOSIS — I482 Chronic atrial fibrillation, unspecified: Secondary | ICD-10-CM

## 2016-04-29 DIAGNOSIS — I272 Pulmonary hypertension, unspecified: Secondary | ICD-10-CM | POA: Diagnosis not present

## 2016-04-29 DIAGNOSIS — I10 Essential (primary) hypertension: Secondary | ICD-10-CM | POA: Diagnosis not present

## 2016-04-29 DIAGNOSIS — I25810 Atherosclerosis of coronary artery bypass graft(s) without angina pectoris: Secondary | ICD-10-CM | POA: Diagnosis not present

## 2016-04-29 DIAGNOSIS — Z952 Presence of prosthetic heart valve: Secondary | ICD-10-CM

## 2016-04-29 DIAGNOSIS — J449 Chronic obstructive pulmonary disease, unspecified: Secondary | ICD-10-CM

## 2016-04-29 DIAGNOSIS — I110 Hypertensive heart disease with heart failure: Secondary | ICD-10-CM | POA: Diagnosis not present

## 2016-04-29 DIAGNOSIS — E1140 Type 2 diabetes mellitus with diabetic neuropathy, unspecified: Secondary | ICD-10-CM | POA: Diagnosis not present

## 2016-04-29 DIAGNOSIS — E785 Hyperlipidemia, unspecified: Secondary | ICD-10-CM

## 2016-04-29 DIAGNOSIS — I359 Nonrheumatic aortic valve disorder, unspecified: Secondary | ICD-10-CM

## 2016-04-29 DIAGNOSIS — E118 Type 2 diabetes mellitus with unspecified complications: Secondary | ICD-10-CM

## 2016-04-29 DIAGNOSIS — Z01818 Encounter for other preprocedural examination: Secondary | ICD-10-CM

## 2016-04-29 DIAGNOSIS — I5032 Chronic diastolic (congestive) heart failure: Secondary | ICD-10-CM | POA: Diagnosis not present

## 2016-04-29 DIAGNOSIS — Z9989 Dependence on other enabling machines and devices: Secondary | ICD-10-CM

## 2016-04-29 DIAGNOSIS — G3184 Mild cognitive impairment, so stated: Secondary | ICD-10-CM | POA: Diagnosis not present

## 2016-04-29 DIAGNOSIS — G4733 Obstructive sleep apnea (adult) (pediatric): Secondary | ICD-10-CM

## 2016-04-29 DIAGNOSIS — M48061 Spinal stenosis, lumbar region without neurogenic claudication: Secondary | ICD-10-CM | POA: Diagnosis not present

## 2016-04-29 DIAGNOSIS — I2510 Atherosclerotic heart disease of native coronary artery without angina pectoris: Secondary | ICD-10-CM | POA: Diagnosis not present

## 2016-04-29 LAB — POCT INR: INR: 2.7

## 2016-04-29 MED ORDER — NITROGLYCERIN 0.4 MG SL SUBL: 0.4000 mg | SUBLINGUAL_TABLET | SUBLINGUAL | 6 refills | Status: DC | PRN

## 2016-04-29 NOTE — Progress Notes (Signed)
Cardiology Office Note    Date:  04/29/2016   ID:  Matthew Edwards, DOB February 01, 1941, MRN [id number]  PCP:  Donnamae Jude, MD  Cardiologist:  [name] Sleep clinic: Dr. Claiborne Billings   Preoperative visit referred by Dr. [name]  Chief Complaint  Patient presents with  . Pre-op Exam    seen for [name], consult by Dr. [name]    History of Present Illness:  Matthew Edwards is a 75 y.o. male with PMH of CAD s/p stent to LAD and LCx, chronic atrial fibrillation on Coumadin, COPD, GERD, hypertension, hyperlipidemia, DM II, severe OSA and s/p AVR. In 2012, he underwent aortic valve replacement with #9m Edwards pericardial valve and had LIMA to LAD CABG. Last cardiac catheterization performed in January 2012 prior to CABG and valvular replacement surgery shows patent LAD stent with 40% stenosis proximal to the stent, patent left circumflex stent. He was initially placed on CPAP, this was later transitioned to BiPAP. His BiPAP has been managed by Dr. KClaiborne Billings His last office visit with Dr. JMartiniquewas on 03/19/2015 at which point he was doing well. He was last seen by Dr. KClaiborne Billingsin November 2016. She he did go to the hospital in December 2016 for vertical, and was prescribed meclizine. He was admitted in the April for recurrent fall and weakness concerning for CVA versus spinal etiology. Neurology was consulted, MRI of the brain as well as cervical and lumbar spine was negative except for atrophy and chronic microvascular ischemic changes. Urine drug test and an EtOH was negative. TSH and RPR ordered normal. Vitamin B-12 was normal however on the low side of range, therefore he was given for vitamin B-12 injection prior to leaving the hospital. He was seen by family medicine in October 2017 for altered mental status, it was felt her dizziness is likely related to orthostatic hypotension in the setting of diarrhea.  Patient presents today for preop clearance at the recommendation of Dr. GJohney Maine  Based on the preop note dated 04/20/2016, patient has history of correct polyp that was [facility], he was referred for surgery because his distal rectal polyp was concerning for high-grade dysplasia. The plan is to proceed with TEM resection of left lateral distal rectal polyp. Talking with the patient today, he does admit to have occasional intermittent chest discomfort recently, however this appears to be quite stable by his account. He only noticed the chest discomfort after very strenuous activity, normal walking does not seems to bring it on. He also cannot tell me what was his previous angina symptom. He does have chronic shortness of breath with exertion as well. He denies any dizziness, lower extremity edema, orthopnea or PND episodes. By all account, he is symptom appears to be Class I or II angina. Given the very low risk nature of his surgery, no further workup is needed prior to the surgery. I have discussed with Dr. CSallyanne KusterDOD who is agreeable to this plan. He does have permanent atrial fibrillation on Coumadin. We will have his Coumadin level to be checked again today. I have discussed with our clinical pharmacist, usual recommendation is for Lovenox bridging if CHA2DS2-Vasc score >4, however his score is exactly 4 and does not need bridging at this time.    Past Medical History:  Diagnosis Date  . Atrial fibrillation ([place]   . CAD (coronary artery disease) 2012   [place]-LAD  . COPD (chronic obstructive pulmonary disease) ([place]   .  GERD (gastroesophageal reflux disease)   . HEART FAILURE, CONGESTIVE UNSPEC 02/23/2007   Qualifier: Diagnosis of  By: Mellody Drown MD, [facility]    . Hyperlipidemia   . Hypertension   . OSA (obstructive sleep apnea) 11/13/2014  . S/P AVR    #25 mm Edwards pericardial valve Bioprosthetic. Dr Cyndia Bent.  . Situational depression    "son passed 11/2013"  . Sleep apnea    occ uses c pap  . Stented coronary artery 2013  .  TOBACCO DEPENDENCE 07/29/2006   Qualifier: History of  By: Carlena Sax  MD, [place] [place]    . Type II diabetes mellitus ([place])     Past Surgical History:  Procedure Laterality Date  . AORTIC VALVE REPLACEMENT  07/2010   notes 07/28/2010  . CARDIAC CATHETERIZATION  06/2010   Archie Endo 07/01/2010  . CORONARY ANGIOPLASTY WITH STENT PLACEMENT  2006; 10/2006   "2"; 1/notes 10/01/2010  . CORONARY ARTERY BYPASS GRAFT  07/2010   "1"/notes 07/28/2010    Current Medications: Outpatient Medications Prior to Visit  Medication Sig Dispense Refill  . albuterol (PROVENTIL HFA;VENTOLIN HFA) 108 (90 Base) MCG/ACT inhaler Inhale 2 puffs into the lungs every 6 (six) hours as needed for wheezing or shortness of breath. 1 Inhaler 3  . atorvastatin (LIPITOR) 40 MG tablet Take 1 tablet (40 mg total) by mouth daily at 6 PM. 90 tablet 0  . benazepril (LOTENSIN) 20 MG tablet Take 1 tablet (20 mg total) by mouth daily. 90 tablet 0  . Blood Glucose Monitoring Suppl (ACCU-CHEK AVIVA PLUS) W/DEVICE KIT 1 Units by Does not apply route 3 (three) times daily. 1 kit 0  . budesonide-formoterol (SYMBICORT) 80-4.5 MCG/ACT inhaler Inhale 2 puffs into the lungs 2 (two) times daily. 10 Inhaler 11  . Cholecalciferol (VITAMIN D-3 PO) Take 1,000 Units by mouth daily.    Marland Kitchen diltiazem (CARDIZEM CD) 120 MG 24 hr capsule Take 1 capsule (120 mg total) by mouth daily. 90 capsule 3  . furosemide (LASIX) 40 MG tablet Take 1 tablet (40 mg total) by mouth 2 (two) times daily. 90 tablet 3  . glipiZIDE (GLUCOTROL) 10 MG tablet Take 1 tablet by mouth at 5 PM with a meal. 90 tablet 0  . glucose blood (ACCU-CHEK AVIVA PLUS) test strip USE TO CHECK BLOOD SUGARS FOUR TIMES DAILY 400 each 3  . metoprolol succinate (TOPROL-XL) 25 MG 24 hr tablet Take 1 tablet (25 mg total) by mouth daily. Reported on 11/28/2015 90 tablet 0  . polycarbophil (FIBERCON) 625 MG tablet Take 1 tablet (625 mg total) by mouth daily. 90 tablet 3  . polyethylene glycol powder (GLYCOLAX/MIRALAX)  powder Take 17 g by mouth 2 (two) times daily as needed. 3350 g 3  . potassium chloride SA (K-DUR,KLOR-CON) 20 MEQ tablet Take 1 tablet (20 mEq total) by mouth daily. 90 tablet 3  . senna (SENOKOT) 8.6 MG TABS tablet Take 2 tablets (17.2 mg total) by mouth at bedtime. (Patient taking differently: Take 2 tablets by mouth at bedtime as needed. ) 180 each 3  . vitamin C (ASCORBIC ACID) 500 MG tablet Take 1 tablet (500 mg total) by mouth every morning. 90 tablet 3  . warfarin (COUMADIN) 5 MG tablet Take 1 tablet (5 mg total) by mouth daily. 90 tablet 0  . nitroGLYCERIN (NITROSTAT) 0.4 MG SL tablet Place 1 tablet (0.4 mg total) under the tongue every 5 (five) minutes as needed. For chest pain 25 tablet 6   Facility-Administered Medications Prior to Visit  Medication Dose Route  Frequency Provider Last Rate Last Dose  . 0.9 %  sodium chloride infusion  500 mL Intravenous Continuous Nelida Meuse III, MD         Allergies:   Pineapple concentrate   Social History   Social History  . Marital status: Legally Separated    Spouse name: N/A  . Number of children: 7  . Years of education: 21   Occupational History  . Retired-[employer]  . [employer]    Social History Main Topics  . Smoking status: Former Smoker    Packs/day: 0.50    Years: 46.00    Types: Cigarettes  . Smokeless tobacco: Never Used  . Alcohol use No  . Drug use: No  . Sexual activity: Not Asked   Other Topics Concern  . None   Social History Narrative   Health Care POA:    Emergency Contact: Daughter, [name]   End of Life Plan:    Who lives with you: self   Any pets: none   Diet: Patient has a varied diet of protein, starch, and vegetables.   Exercise: Pt has not regular exercise routine.   Seatbelts: Pt reports wearing seatbelt when in vehile.   Nancy Fetter Exposure/Protection:    Hobbies: fishing, tv           Family History:  The patient's family history includes Heart disease in his mother.   ROS:     Please see the history of present illness.    ROS All other systems reviewed and are negative.   PHYSICAL EXAM:   VS:  BP 132/70   Pulse 76   Ht 5' 9" (1.753 m)   Wt 253 lb (114.8 kg)   BMI 37.36 kg/m    GEN: Well nourished, well developed, in no acute distress  HEENT: normal  Neck: no JVD, carotid bruits, or masses Cardiac: Irregularly irregular; no murmurs, rubs, or gallops,no edema  Respiratory:  clear to auscultation bilaterally, normal work of breathing GI: soft, nontender, nondistended, + BS MS: no deformity or atrophy  Skin: warm and dry, no rash Neuro:  Alert and Oriented x 3, Strength and sensation are intact Psych: euthymic mood, full affect  Wt Readings from Last 3 Encounters:  04/29/16 253 lb (114.8 kg)  04/16/16 251 lb (113.9 kg)  03/13/16 252 lb (114.3 kg)      Studies/Labs Reviewed:   EKG:  EKG is ordered today.  The ekg ordered today demonstrates Atrial fibrillation, QTC 411, occasional PVC  Recent Labs: 09/19/2015: TSH 1.014 09/24/2015: ALT 26 03/03/2016: BUN 17; Creatinine, Ser 1.23; Hemoglobin 15.6; Platelets 133; Potassium 5.2; Sodium 138   Lipid Panel    Component Value Date/Time   CHOL 145 07/25/2015 1558   TRIG 181 (H) 07/25/2015 1558   HDL 39 (L) 07/25/2015 1558   CHOLHDL 3.7 07/25/2015 1558   VLDL 36 (H) 07/25/2015 1558   LDLCALC 70 07/25/2015 1558   LDLDIRECT 81 12/23/2012 1638    Additional studies/ records that were reviewed today include:   Cath 11/15/2006  FINAL IMPRESSIONS:  1. Multivessel coronary artery disease with patent stents in LAD and      circumflex.  2. New de novo lesion in the proximal circumflex obtuse marginal      branch of 95%.  3. Successful direct stenting of the proximal obtuse marginal branch      #1 with a non drug coated stent.  4. Aortic stenosis with a transvalvular gradient of 47 mm.  5. Hypertrophic  profile of left ventricle with ejection fraction of 75-      85%.   Cath 06/30/2010 SELECTIVE  CORONARY ANGIOGRAPHY: 1. Left main normal. 2. LAD; the mid-LAD stent was widely patent.  There was a 40% stenosis     just proximal to the stent. 3. Left circumflex; the mid AV groove circumflex stent was widely     patent. 4. Right coronary; this was a dominant vessel and was free of     significant disease. 5. Supravalvular aortography; supravalvar aortogram was performed     using 20 mL of Visipaque dye at 20 mL per second.  There was no     aortic insufficiency noted.  The supravalvular ascending thoracic     aorta was not aneurysmal.   ASSESSMENT:    1. Pre-op exam   2. Essential hypertension   3. Coronary artery disease involving coronary bypass graft of native heart without angina pectoris   4. Chronic atrial fibrillation (HCC)   5. Chronic obstructive pulmonary disease, unspecified COPD type ([facility])   6. Hyperlipidemia, unspecified hyperlipidemia type   7. Controlled type 2 diabetes mellitus with complication, without long-term current use of insulin ([place])   8. OSA on CPAP   9. S/P AVR (aortic valve replacement)      PLAN:  In order of problems listed above:  1. Preoperative clearance for TEM removal of High-grade rectal polyp  - No further ischemic workup is needed. Although the patient does have mild exertional angina, however it is low-grade and a fairly stable. Currently class I or II. After discussing with DOD, we felt he is stable to undergo surgery. I plan to see the patient back in 4-6 weeks, if his symptom worsens, we will proceed with Myoview.  2. Intermittent chest pain with exertion: This seems to occur only with extremely strenuous activities. He is not clear if this is similar to his previous angina or not. We'll continue observation for now [place] his chest discomfort seems to be quite stable and only occur with extremely strenuous activity.  3. Chronic atrial fibrillation on coumadin:   - This patients CHA2DS2-VASc Score and unadjusted Ischemic Stroke Rate (%  per year) is equal to 4.8 % stroke rate/year from a score of 4  Above score calculated as 1 point each if present [CHF, HTN, DM, Vascular=MI/PAD/Aortic Plaque, Age if 65-74, or Male] Above score calculated as 2 points each if present [Age > 75, or Stroke/TIA/TE]  - INR 2.7 today. Advised the patient to hold Coumadin for 5 days. I have discussed with clinical pharmacist, no bridging with lovenox is needed based on current guidelines.  4. CAD s/p CABG: Last cardiac cath in generator 2012, stable anatomy at the time with patent stent in left circumflex and also LAD, did have a 40% stenosis proximal to the previously placed LAD stent. Underwent LIMA to LAD during valvular surgery.  5. HTN: Blood pressure stable, blood pressure today is 132/70.  6. HLD: On Lipitor 40 mg daily. Last lipid panel February 2017, total cholesterol 145, triglyceride 181, HDL 39, LDL 70. He is due for another lipid panel in February 2018.  7. DM II: On glipizide at home.  8. AVR: Stable, last echo March 2016, EF 50-55%, bioprosthetic aortic valve present and functioning normally. He is due for repeat echocardiogram around March 2018.    Medication Adjustments/Labs and Tests Ordered: Current medicines are reviewed at length with the patient today.  Concerns regarding medicines are outlined above.  Medication changes, Labs  and Tests ordered today are listed in the Patient Instructions below. Patient Instructions  Medication Instructions:  Continue current medications  Labwork: None Ordered  Testing/Procedures: None Ordered  Follow-Up: Your physician recommends that you schedule a follow-up appointment in: 4-6 weeks with Almyra Deforest when Dr Martinique in Office   Any Other Special Instructions Will Be Listed Below (If Applicable).  You are Cleared for Surgery   If you need a refill on your cardiac medications before your next appointment, please call your pharmacy.      Hilbert Corrigan, [place]  04/29/2016 1:37  PM    [facility] [address], [place], [place]  [address] Phone: [phone]; Fax: [phone]

## 2016-04-29 NOTE — Patient Instructions (Signed)
Medication Instructions:  Continue current medications  Labwork: None Ordered  Testing/Procedures: None Ordered  Follow-Up: Your physician recommends that you schedule a follow-up appointment in: 4-6 weeks with Almyra Deforest when Dr Martinique in Office   Any Other Special Instructions Will Be Listed Below (If Applicable).  You are Cleared for Surgery   If you need a refill on your cardiac medications before your next appointment, please call your pharmacy.

## 2016-04-30 DIAGNOSIS — I272 Pulmonary hypertension, unspecified: Secondary | ICD-10-CM | POA: Diagnosis not present

## 2016-04-30 DIAGNOSIS — G3184 Mild cognitive impairment, so stated: Secondary | ICD-10-CM | POA: Diagnosis not present

## 2016-04-30 DIAGNOSIS — I110 Hypertensive heart disease with heart failure: Secondary | ICD-10-CM | POA: Diagnosis not present

## 2016-04-30 DIAGNOSIS — I482 Chronic atrial fibrillation: Secondary | ICD-10-CM | POA: Diagnosis not present

## 2016-04-30 DIAGNOSIS — M48061 Spinal stenosis, lumbar region without neurogenic claudication: Secondary | ICD-10-CM | POA: Diagnosis not present

## 2016-04-30 DIAGNOSIS — J449 Chronic obstructive pulmonary disease, unspecified: Secondary | ICD-10-CM | POA: Diagnosis not present

## 2016-04-30 DIAGNOSIS — I5032 Chronic diastolic (congestive) heart failure: Secondary | ICD-10-CM | POA: Diagnosis not present

## 2016-04-30 DIAGNOSIS — E1140 Type 2 diabetes mellitus with diabetic neuropathy, unspecified: Secondary | ICD-10-CM | POA: Diagnosis not present

## 2016-04-30 DIAGNOSIS — I2510 Atherosclerotic heart disease of native coronary artery without angina pectoris: Secondary | ICD-10-CM | POA: Diagnosis not present

## 2016-05-04 DIAGNOSIS — I110 Hypertensive heart disease with heart failure: Secondary | ICD-10-CM | POA: Diagnosis not present

## 2016-05-04 DIAGNOSIS — J449 Chronic obstructive pulmonary disease, unspecified: Secondary | ICD-10-CM | POA: Diagnosis not present

## 2016-05-04 DIAGNOSIS — I482 Chronic atrial fibrillation: Secondary | ICD-10-CM | POA: Diagnosis not present

## 2016-05-04 DIAGNOSIS — M48061 Spinal stenosis, lumbar region without neurogenic claudication: Secondary | ICD-10-CM | POA: Diagnosis not present

## 2016-05-04 DIAGNOSIS — G3184 Mild cognitive impairment, so stated: Secondary | ICD-10-CM | POA: Diagnosis not present

## 2016-05-04 DIAGNOSIS — E1140 Type 2 diabetes mellitus with diabetic neuropathy, unspecified: Secondary | ICD-10-CM | POA: Diagnosis not present

## 2016-05-04 DIAGNOSIS — I2510 Atherosclerotic heart disease of native coronary artery without angina pectoris: Secondary | ICD-10-CM | POA: Diagnosis not present

## 2016-05-04 DIAGNOSIS — I5032 Chronic diastolic (congestive) heart failure: Secondary | ICD-10-CM | POA: Diagnosis not present

## 2016-05-04 DIAGNOSIS — I272 Pulmonary hypertension, unspecified: Secondary | ICD-10-CM | POA: Diagnosis not present

## 2016-05-05 DIAGNOSIS — I2510 Atherosclerotic heart disease of native coronary artery without angina pectoris: Secondary | ICD-10-CM | POA: Diagnosis not present

## 2016-05-05 DIAGNOSIS — G3184 Mild cognitive impairment, so stated: Secondary | ICD-10-CM | POA: Diagnosis not present

## 2016-05-05 DIAGNOSIS — I482 Chronic atrial fibrillation: Secondary | ICD-10-CM | POA: Diagnosis not present

## 2016-05-05 DIAGNOSIS — I5032 Chronic diastolic (congestive) heart failure: Secondary | ICD-10-CM | POA: Diagnosis not present

## 2016-05-05 DIAGNOSIS — E1140 Type 2 diabetes mellitus with diabetic neuropathy, unspecified: Secondary | ICD-10-CM | POA: Diagnosis not present

## 2016-05-05 DIAGNOSIS — I110 Hypertensive heart disease with heart failure: Secondary | ICD-10-CM | POA: Diagnosis not present

## 2016-05-05 DIAGNOSIS — I272 Pulmonary hypertension, unspecified: Secondary | ICD-10-CM | POA: Diagnosis not present

## 2016-05-05 DIAGNOSIS — M48061 Spinal stenosis, lumbar region without neurogenic claudication: Secondary | ICD-10-CM | POA: Diagnosis not present

## 2016-05-05 DIAGNOSIS — J449 Chronic obstructive pulmonary disease, unspecified: Secondary | ICD-10-CM | POA: Diagnosis not present

## 2016-05-07 DIAGNOSIS — M48061 Spinal stenosis, lumbar region without neurogenic claudication: Secondary | ICD-10-CM | POA: Diagnosis not present

## 2016-05-07 DIAGNOSIS — J449 Chronic obstructive pulmonary disease, unspecified: Secondary | ICD-10-CM | POA: Diagnosis not present

## 2016-05-07 DIAGNOSIS — E1140 Type 2 diabetes mellitus with diabetic neuropathy, unspecified: Secondary | ICD-10-CM | POA: Diagnosis not present

## 2016-05-07 DIAGNOSIS — I272 Pulmonary hypertension, unspecified: Secondary | ICD-10-CM | POA: Diagnosis not present

## 2016-05-07 DIAGNOSIS — I482 Chronic atrial fibrillation: Secondary | ICD-10-CM | POA: Diagnosis not present

## 2016-05-07 DIAGNOSIS — I110 Hypertensive heart disease with heart failure: Secondary | ICD-10-CM | POA: Diagnosis not present

## 2016-05-07 DIAGNOSIS — I2510 Atherosclerotic heart disease of native coronary artery without angina pectoris: Secondary | ICD-10-CM | POA: Diagnosis not present

## 2016-05-07 DIAGNOSIS — I5032 Chronic diastolic (congestive) heart failure: Secondary | ICD-10-CM | POA: Diagnosis not present

## 2016-05-07 DIAGNOSIS — G3184 Mild cognitive impairment, so stated: Secondary | ICD-10-CM | POA: Diagnosis not present

## 2016-05-08 DIAGNOSIS — I110 Hypertensive heart disease with heart failure: Secondary | ICD-10-CM | POA: Diagnosis not present

## 2016-05-08 DIAGNOSIS — I5032 Chronic diastolic (congestive) heart failure: Secondary | ICD-10-CM | POA: Diagnosis not present

## 2016-05-08 DIAGNOSIS — G3184 Mild cognitive impairment, so stated: Secondary | ICD-10-CM | POA: Diagnosis not present

## 2016-05-08 DIAGNOSIS — E1140 Type 2 diabetes mellitus with diabetic neuropathy, unspecified: Secondary | ICD-10-CM | POA: Diagnosis not present

## 2016-05-08 DIAGNOSIS — M48061 Spinal stenosis, lumbar region without neurogenic claudication: Secondary | ICD-10-CM | POA: Diagnosis not present

## 2016-05-08 DIAGNOSIS — I482 Chronic atrial fibrillation: Secondary | ICD-10-CM | POA: Diagnosis not present

## 2016-05-08 DIAGNOSIS — I2510 Atherosclerotic heart disease of native coronary artery without angina pectoris: Secondary | ICD-10-CM | POA: Diagnosis not present

## 2016-05-08 DIAGNOSIS — J449 Chronic obstructive pulmonary disease, unspecified: Secondary | ICD-10-CM | POA: Diagnosis not present

## 2016-05-08 DIAGNOSIS — I272 Pulmonary hypertension, unspecified: Secondary | ICD-10-CM | POA: Diagnosis not present

## 2016-05-11 DIAGNOSIS — I110 Hypertensive heart disease with heart failure: Secondary | ICD-10-CM | POA: Diagnosis not present

## 2016-05-11 DIAGNOSIS — I5032 Chronic diastolic (congestive) heart failure: Secondary | ICD-10-CM | POA: Diagnosis not present

## 2016-05-11 DIAGNOSIS — G3184 Mild cognitive impairment, so stated: Secondary | ICD-10-CM | POA: Diagnosis not present

## 2016-05-11 DIAGNOSIS — J449 Chronic obstructive pulmonary disease, unspecified: Secondary | ICD-10-CM | POA: Diagnosis not present

## 2016-05-11 DIAGNOSIS — I272 Pulmonary hypertension, unspecified: Secondary | ICD-10-CM | POA: Diagnosis not present

## 2016-05-11 DIAGNOSIS — E1140 Type 2 diabetes mellitus with diabetic neuropathy, unspecified: Secondary | ICD-10-CM | POA: Diagnosis not present

## 2016-05-11 DIAGNOSIS — M48061 Spinal stenosis, lumbar region without neurogenic claudication: Secondary | ICD-10-CM | POA: Diagnosis not present

## 2016-05-11 DIAGNOSIS — I2510 Atherosclerotic heart disease of native coronary artery without angina pectoris: Secondary | ICD-10-CM | POA: Diagnosis not present

## 2016-05-11 DIAGNOSIS — I482 Chronic atrial fibrillation: Secondary | ICD-10-CM | POA: Diagnosis not present

## 2016-05-12 DIAGNOSIS — E1140 Type 2 diabetes mellitus with diabetic neuropathy, unspecified: Secondary | ICD-10-CM | POA: Diagnosis not present

## 2016-05-12 DIAGNOSIS — I110 Hypertensive heart disease with heart failure: Secondary | ICD-10-CM | POA: Diagnosis not present

## 2016-05-12 DIAGNOSIS — M48061 Spinal stenosis, lumbar region without neurogenic claudication: Secondary | ICD-10-CM | POA: Diagnosis not present

## 2016-05-12 DIAGNOSIS — I272 Pulmonary hypertension, unspecified: Secondary | ICD-10-CM | POA: Diagnosis not present

## 2016-05-12 DIAGNOSIS — I5032 Chronic diastolic (congestive) heart failure: Secondary | ICD-10-CM | POA: Diagnosis not present

## 2016-05-12 DIAGNOSIS — I2510 Atherosclerotic heart disease of native coronary artery without angina pectoris: Secondary | ICD-10-CM | POA: Diagnosis not present

## 2016-05-12 DIAGNOSIS — G3184 Mild cognitive impairment, so stated: Secondary | ICD-10-CM | POA: Diagnosis not present

## 2016-05-12 DIAGNOSIS — I482 Chronic atrial fibrillation: Secondary | ICD-10-CM | POA: Diagnosis not present

## 2016-05-12 DIAGNOSIS — J449 Chronic obstructive pulmonary disease, unspecified: Secondary | ICD-10-CM | POA: Diagnosis not present

## 2016-05-13 DIAGNOSIS — I5032 Chronic diastolic (congestive) heart failure: Secondary | ICD-10-CM | POA: Diagnosis not present

## 2016-05-13 DIAGNOSIS — J449 Chronic obstructive pulmonary disease, unspecified: Secondary | ICD-10-CM | POA: Diagnosis not present

## 2016-05-13 DIAGNOSIS — I482 Chronic atrial fibrillation: Secondary | ICD-10-CM | POA: Diagnosis not present

## 2016-05-13 DIAGNOSIS — E1140 Type 2 diabetes mellitus with diabetic neuropathy, unspecified: Secondary | ICD-10-CM | POA: Diagnosis not present

## 2016-05-13 DIAGNOSIS — I272 Pulmonary hypertension, unspecified: Secondary | ICD-10-CM | POA: Diagnosis not present

## 2016-05-13 DIAGNOSIS — I2510 Atherosclerotic heart disease of native coronary artery without angina pectoris: Secondary | ICD-10-CM | POA: Diagnosis not present

## 2016-05-13 DIAGNOSIS — G3184 Mild cognitive impairment, so stated: Secondary | ICD-10-CM | POA: Diagnosis not present

## 2016-05-13 DIAGNOSIS — M48061 Spinal stenosis, lumbar region without neurogenic claudication: Secondary | ICD-10-CM | POA: Diagnosis not present

## 2016-05-13 DIAGNOSIS — I110 Hypertensive heart disease with heart failure: Secondary | ICD-10-CM | POA: Diagnosis not present

## 2016-05-14 DIAGNOSIS — G3184 Mild cognitive impairment, so stated: Secondary | ICD-10-CM | POA: Diagnosis not present

## 2016-05-14 DIAGNOSIS — I272 Pulmonary hypertension, unspecified: Secondary | ICD-10-CM | POA: Diagnosis not present

## 2016-05-14 DIAGNOSIS — I110 Hypertensive heart disease with heart failure: Secondary | ICD-10-CM | POA: Diagnosis not present

## 2016-05-14 DIAGNOSIS — J449 Chronic obstructive pulmonary disease, unspecified: Secondary | ICD-10-CM | POA: Diagnosis not present

## 2016-05-14 DIAGNOSIS — M48061 Spinal stenosis, lumbar region without neurogenic claudication: Secondary | ICD-10-CM | POA: Diagnosis not present

## 2016-05-14 DIAGNOSIS — E1140 Type 2 diabetes mellitus with diabetic neuropathy, unspecified: Secondary | ICD-10-CM | POA: Diagnosis not present

## 2016-05-14 DIAGNOSIS — I482 Chronic atrial fibrillation: Secondary | ICD-10-CM | POA: Diagnosis not present

## 2016-05-14 DIAGNOSIS — I2510 Atherosclerotic heart disease of native coronary artery without angina pectoris: Secondary | ICD-10-CM | POA: Diagnosis not present

## 2016-05-14 DIAGNOSIS — I5032 Chronic diastolic (congestive) heart failure: Secondary | ICD-10-CM | POA: Diagnosis not present

## 2016-05-14 NOTE — Patient Instructions (Signed)
Matthew Edwards  05/14/2016   Your procedure is scheduled on: Tuesday 05/26/2016  Report to [facility] Main  Entrance take [facility]  elevators to 3rd floor to  [place] at  0930 AM.  Call this number if you have problems the morning of surgery [phone]   Remember: ONLY 1 PERSON MAY GO WITH YOU TO SHORT STAY TO GET  READY MORNING OF [place].              Follow Bowel Prep instructions from Dr. Johney Maine day before surgery- Monday 05/25/2016 along with a clear liquid diet all day til midnight!                 CLEAR LIQUID DIET   Foods Allowed                                                                     Foods Excluded  Coffee and tea, regular and decaf                             liquids that you cannot  Plain Jell-O in any flavor                                             see through such as: Fruit ices (not with fruit pulp)                                     milk, soups, orange juice  Iced Popsicles                                    All solid food Carbonated beverages, regular and diet                                    Cranberry, grape and apple juices Sports drinks like Gatorade Lightly seasoned clear broth or consume(fat free) Sugar, honey syrup  Sample Menu Breakfast                                Lunch                                     Supper Cranberry juice                    Beef broth                            Chicken broth Jell-O  Grape juice                           Apple juice Coffee or tea                        Jell-O                                      Popsicle                                                Coffee or tea                        Coffee or tea  _____________________________________________________________________        Do not eat food or drink liquids :After Midnight.     Take these medicines the morning of surgery with A SIP OF WATER: Metoprolol, Albuterol  inhaler if needed, Symbicort inhaler  How to Manage Your Diabetes Before and After Surgery  Why is it important to control my blood sugar before and after surgery? . Improving blood sugar levels before and after surgery helps healing and can limit problems. . A way of improving blood sugar control is eating a healthy diet by: o  Eating less sugar and carbohydrates o  Increasing activity/exercise o  Talking with your doctor about reaching your blood sugar goals . High blood sugars (greater than 180 mg/dL) can raise your risk of infections and slow your recovery, so you will need to focus on controlling your diabetes during the weeks before surgery. . Make sure that the doctor who takes care of your diabetes knows about your planned surgery including the date and location.  How do I manage my blood sugar before surgery? . Check your blood sugar at least 4 times a day, starting 2 days before surgery, to make sure that the level is not too high or low. o Check your blood sugar the morning of your surgery when you wake up and every 2 hours until you get to the Short Stay unit. . If your blood sugar is less than 70 mg/dL, you will need to treat for low blood sugar: o Do not take insulin. o Treat a low blood sugar (less than 70 mg/dL) with  cup of clear juice (cranberry or apple), 4 glucose tablets, OR glucose gel. o Recheck blood sugar in 15 minutes after treatment (to make sure it is greater than 70 mg/dL). If your blood sugar is not greater than 70 mg/dL on recheck, call [phone] for further instructions. . Report your blood sugar to the short stay nurse when you get to Short Stay.  . If you are admitted to the hospital after surgery: o Your blood sugar will be checked by the staff and you will probably be given insulin after surgery (instead of oral diabetes medicines) to make sure you have good blood sugar levels. o The goal for blood sugar control after surgery is 80-180 mg/dL.   WHAT  DO I DO ABOUT MY DIABETES MEDICATION?  Marland Kitchen Do not take oral diabetes medicines (pills) the morning of surgery.     DO NOT TAKE ANY ORAL DIABETIC MEDICATIONS DAY  OF YOUR SURGERY!                               You may not have any metal on your body including hair pins and              piercings  Do not wear jewelry, make-up, lotions, powders or perfumes, deodorant             Do not wear nail polish.  Do not shave  48 hours prior to surgery.              Men may shave face and neck.   Do not bring valuables to the hospital. [place].  Contacts, dentures or bridgework may not be worn into surgery.  Leave suitcase in the car. After surgery it may be brought to your room.                  Please read over the following fact sheets you were given: _____________________________________________________________________             [facility] - Preparing for Surgery Before surgery, you can play an important role.  Because skin is not sterile, your skin needs to be as free of germs as possible.  You can reduce the number of germs on your skin by washing with CHG (chlorahexidine gluconate) soap before surgery.  CHG is an antiseptic cleaner which kills germs and bonds with the skin to continue killing germs even after washing. Please DO NOT use if you have an allergy to CHG or antibacterial soaps.  If your skin becomes reddened/irritated stop using the CHG and inform your nurse when you arrive at Short Stay. Do not shave (including legs and underarms) for at least 48 hours prior to the first CHG shower.  You may shave your face/neck. Please follow these instructions carefully:  1.  Shower with CHG Soap the night before surgery and the  morning of Surgery.  2.  If you choose to wash your hair, wash your hair first as usual with your  normal  shampoo.  3.  After you shampoo, rinse your hair and body thoroughly to remove the  shampoo.                            4.  Use CHG as you would any other liquid soap.  You can apply chg directly  to the skin and wash                       Gently with a scrungie or clean washcloth.  5.  Apply the CHG Soap to your body ONLY FROM THE NECK DOWN.   Do not use on face/ open                           Wound or open sores. Avoid contact with eyes, ears mouth and genitals (private parts).                       Wash face,  Genitals (private parts) with your normal soap.             6.  Wash thoroughly, paying special attention to the area  where your surgery  will be performed.  7.  Thoroughly rinse your body with warm water from the neck down.  8.  DO NOT shower/wash with your normal soap after using and rinsing off  the CHG Soap.                9.  Pat yourself dry with a clean towel.            10.  Wear clean pajamas.            11.  Place clean sheets on your bed the night of your first shower and do not  sleep with pets. Day of Surgery : Do not apply any lotions/deodorants the morning of surgery.  Please wear clean clothes to the hospital/surgery center.  FAILURE TO FOLLOW THESE INSTRUCTIONS MAY RESULT IN THE CANCELLATION OF YOUR SURGERY PATIENT SIGNATURE_________________________________  NURSE SIGNATURE__________________________________  ________________________________________________________________________

## 2016-05-15 ENCOUNTER — Telehealth: Payer: Self-pay | Admitting: Family Medicine

## 2016-05-15 DIAGNOSIS — E1140 Type 2 diabetes mellitus with diabetic neuropathy, unspecified: Secondary | ICD-10-CM | POA: Diagnosis not present

## 2016-05-15 DIAGNOSIS — M48061 Spinal stenosis, lumbar region without neurogenic claudication: Secondary | ICD-10-CM | POA: Diagnosis not present

## 2016-05-15 DIAGNOSIS — I482 Chronic atrial fibrillation: Secondary | ICD-10-CM | POA: Diagnosis not present

## 2016-05-15 DIAGNOSIS — I2510 Atherosclerotic heart disease of native coronary artery without angina pectoris: Secondary | ICD-10-CM | POA: Diagnosis not present

## 2016-05-15 DIAGNOSIS — R4189 Other symptoms and signs involving cognitive functions and awareness: Secondary | ICD-10-CM

## 2016-05-15 DIAGNOSIS — J449 Chronic obstructive pulmonary disease, unspecified: Secondary | ICD-10-CM | POA: Diagnosis not present

## 2016-05-15 DIAGNOSIS — I110 Hypertensive heart disease with heart failure: Secondary | ICD-10-CM | POA: Diagnosis not present

## 2016-05-15 DIAGNOSIS — G3184 Mild cognitive impairment, so stated: Secondary | ICD-10-CM | POA: Diagnosis not present

## 2016-05-15 DIAGNOSIS — I272 Pulmonary hypertension, unspecified: Secondary | ICD-10-CM | POA: Diagnosis not present

## 2016-05-15 DIAGNOSIS — I5032 Chronic diastolic (congestive) heart failure: Secondary | ICD-10-CM | POA: Diagnosis not present

## 2016-05-15 NOTE — Telephone Encounter (Signed)
Matthew Edwards would like verbal orders for speech therapy once a week for six weeks. Please advise. Thanks! ep

## 2016-05-15 NOTE — Telephone Encounter (Signed)
Will forward to MD. Nnamdi Dacus,CMA  

## 2016-05-15 NOTE — Telephone Encounter (Signed)
LM for Anda Kraft from [facility] asking for more information on what speech therapy will pertain to before verbal ok can be given.  Will try calling again on Monday.  I did speak with daughter and she wants the speech therapist to assess his cognition and memory loss.  Christana Angelica,CMA

## 2016-05-18 DIAGNOSIS — E1140 Type 2 diabetes mellitus with diabetic neuropathy, unspecified: Secondary | ICD-10-CM | POA: Diagnosis not present

## 2016-05-18 DIAGNOSIS — G3184 Mild cognitive impairment, so stated: Secondary | ICD-10-CM | POA: Diagnosis not present

## 2016-05-18 DIAGNOSIS — J449 Chronic obstructive pulmonary disease, unspecified: Secondary | ICD-10-CM | POA: Diagnosis not present

## 2016-05-18 DIAGNOSIS — I110 Hypertensive heart disease with heart failure: Secondary | ICD-10-CM | POA: Diagnosis not present

## 2016-05-18 DIAGNOSIS — M48061 Spinal stenosis, lumbar region without neurogenic claudication: Secondary | ICD-10-CM | POA: Diagnosis not present

## 2016-05-18 DIAGNOSIS — I5032 Chronic diastolic (congestive) heart failure: Secondary | ICD-10-CM | POA: Diagnosis not present

## 2016-05-18 DIAGNOSIS — I272 Pulmonary hypertension, unspecified: Secondary | ICD-10-CM | POA: Diagnosis not present

## 2016-05-18 DIAGNOSIS — I482 Chronic atrial fibrillation: Secondary | ICD-10-CM | POA: Diagnosis not present

## 2016-05-18 DIAGNOSIS — I2510 Atherosclerotic heart disease of native coronary artery without angina pectoris: Secondary | ICD-10-CM | POA: Diagnosis not present

## 2016-05-19 DIAGNOSIS — I110 Hypertensive heart disease with heart failure: Secondary | ICD-10-CM | POA: Diagnosis not present

## 2016-05-19 DIAGNOSIS — I272 Pulmonary hypertension, unspecified: Secondary | ICD-10-CM | POA: Diagnosis not present

## 2016-05-19 DIAGNOSIS — I5032 Chronic diastolic (congestive) heart failure: Secondary | ICD-10-CM | POA: Diagnosis not present

## 2016-05-19 DIAGNOSIS — E1140 Type 2 diabetes mellitus with diabetic neuropathy, unspecified: Secondary | ICD-10-CM | POA: Diagnosis not present

## 2016-05-19 DIAGNOSIS — I2510 Atherosclerotic heart disease of native coronary artery without angina pectoris: Secondary | ICD-10-CM | POA: Diagnosis not present

## 2016-05-19 DIAGNOSIS — G3184 Mild cognitive impairment, so stated: Secondary | ICD-10-CM | POA: Diagnosis not present

## 2016-05-19 DIAGNOSIS — J449 Chronic obstructive pulmonary disease, unspecified: Secondary | ICD-10-CM | POA: Diagnosis not present

## 2016-05-19 DIAGNOSIS — I482 Chronic atrial fibrillation: Secondary | ICD-10-CM | POA: Diagnosis not present

## 2016-05-19 DIAGNOSIS — M48061 Spinal stenosis, lumbar region without neurogenic claudication: Secondary | ICD-10-CM | POA: Diagnosis not present

## 2016-05-19 NOTE — Telephone Encounter (Signed)
Tried calling and leaving a message for Matthew Edwards but was unable to leave a message.  Will let her know of verbal orders once we hear from her again. Kaeden Depaz,CMA

## 2016-05-20 DIAGNOSIS — I110 Hypertensive heart disease with heart failure: Secondary | ICD-10-CM | POA: Diagnosis not present

## 2016-05-20 DIAGNOSIS — E1140 Type 2 diabetes mellitus with diabetic neuropathy, unspecified: Secondary | ICD-10-CM | POA: Diagnosis not present

## 2016-05-20 DIAGNOSIS — I5032 Chronic diastolic (congestive) heart failure: Secondary | ICD-10-CM | POA: Diagnosis not present

## 2016-05-20 DIAGNOSIS — I272 Pulmonary hypertension, unspecified: Secondary | ICD-10-CM | POA: Diagnosis not present

## 2016-05-20 DIAGNOSIS — I2510 Atherosclerotic heart disease of native coronary artery without angina pectoris: Secondary | ICD-10-CM | POA: Diagnosis not present

## 2016-05-20 DIAGNOSIS — M48061 Spinal stenosis, lumbar region without neurogenic claudication: Secondary | ICD-10-CM | POA: Diagnosis not present

## 2016-05-20 DIAGNOSIS — J449 Chronic obstructive pulmonary disease, unspecified: Secondary | ICD-10-CM | POA: Diagnosis not present

## 2016-05-20 DIAGNOSIS — G3184 Mild cognitive impairment, so stated: Secondary | ICD-10-CM | POA: Diagnosis not present

## 2016-05-20 DIAGNOSIS — I482 Chronic atrial fibrillation: Secondary | ICD-10-CM | POA: Diagnosis not present

## 2016-05-20 NOTE — Patient Instructions (Signed)
GRAYSON PFEFFERLE  05/20/2016   Your procedure is scheduled on: 05/26/2016   Report to [facility]  Entrance take [place]  elevators to 3rd floor to  [place] at    0930 AM.  Call this number if you have problems the morning of surgery [phone]   Remember: ONLY 1 PERSON MAY GO WITH YOU TO SHORT STAY TO GET  READY MORNING OF [place].  Do not eat food or drink liquids :After Midnight.     Take these medicines the morning of surgery with A SIP OF WATER: Albuterol Inhaler if needed and bring, Metoprolol, Symbicort inhaler and bring  DO NOT TAKE ANY DIABETIC MEDICATIONS DAY OF YOUR SURGERY                               You may not have any metal on your body including hair pins and              piercings  Do not wear jewelry,  lotions, powders or perfumes, deodorant                        Men may shave face and neck.   Do not bring valuables to the hospital. [place].  Contacts, dentures or bridgework may not be worn into surgery.  Leave suitcase in the car. After surgery it may be brought to your room.     Special Instructions: N/A              Please read over the following fact sheets you were given: _____________________________________________________________________                CLEAR LIQUID DIET   Foods Allowed                                                                     Foods Excluded  Coffee and tea, regular and decaf                             liquids that you cannot  Plain Jell-O in any flavor                                             see through such as: Fruit ices (not with fruit pulp)                                     milk, soups, orange juice  Iced Popsicles                                    All solid food Carbonated beverages, regular and diet  Cranberry, grape and apple juices Sports drinks like Gatorade Lightly  seasoned clear broth or consume(fat free) Sugar, honey syrup  Sample Menu Breakfast                                Lunch                                     Supper Cranberry juice                    Beef broth                            Chicken broth Jell-O                                     Grape juice                           Apple juice Coffee or tea                        Jell-O                                      Popsicle                                                Coffee or tea                        Coffee or tea  _____________________________________________________________________  [facility] - Preparing for Surgery Before surgery, you can play an important role.  Because skin is not sterile, your skin needs to be as free of germs as possible.  You can reduce the number of germs on your skin by washing with CHG (chlorahexidine gluconate) soap before surgery.  CHG is an antiseptic cleaner which kills germs and bonds with the skin to continue killing germs even after washing. Please DO NOT use if you have an allergy to CHG or antibacterial soaps.  If your skin becomes reddened/irritated stop using the CHG and inform your nurse when you arrive at Short Stay. Do not shave (including legs and underarms) for at least 48 hours prior to the first CHG shower.  You may shave your face/neck. Please follow these instructions carefully:  1.  Shower with CHG Soap the night before surgery and the  morning of Surgery.  2.  If you choose to wash your hair, wash your hair first as usual with your  normal  shampoo.  3.  After you shampoo, rinse your hair and body thoroughly to remove the  shampoo.                           4.  Use CHG as you would any other liquid soap.  You can apply chg directly  to the skin and wash  Gently with a scrungie or clean washcloth.  5.  Apply the CHG Soap to your body ONLY FROM THE NECK DOWN.   Do not use on face/ open                            Wound or open sores. Avoid contact with eyes, ears mouth and genitals (private parts).                       Wash face,  Genitals (private parts) with your normal soap.             6.  Wash thoroughly, paying special attention to the area where your surgery  will be performed.  7.  Thoroughly rinse your body with warm water from the neck down.  8.  DO NOT shower/wash with your normal soap after using and rinsing off  the CHG Soap.                9.  Pat yourself dry with a clean towel.            10.  Wear clean pajamas.            11.  Place clean sheets on your bed the night of your first shower and do not  sleep with pets. Day of Surgery : Do not apply any lotions/deodorants the morning of surgery.  Please wear clean clothes to the hospital/surgery center.  FAILURE TO FOLLOW THESE INSTRUCTIONS MAY RESULT IN THE CANCELLATION OF YOUR SURGERY PATIENT SIGNATURE_________________________________  NURSE SIGNATURE__________________________________  ________________________________________________________________________  WHAT IS A BLOOD TRANSFUSION? Blood Transfusion Information  A transfusion is the replacement of blood or some of its parts. Blood is made up of multiple cells which provide different functions.  Red blood cells carry oxygen and are used for blood loss replacement.  White blood cells fight against infection.  Platelets control bleeding.  Plasma helps clot blood.  Other blood products are available for specialized needs, such as hemophilia or other clotting disorders. BEFORE THE TRANSFUSION  Who gives blood for transfusions?   Healthy volunteers who are fully evaluated to make sure their blood is safe. This is blood bank blood. Transfusion therapy is the safest it has ever been in the practice of medicine. Before blood is taken from a donor, a complete history is taken to make sure that person has no history of diseases nor engages in risky social behavior (examples are  intravenous drug use or sexual activity with multiple partners). The donor's travel history is screened to minimize risk of transmitting infections, such as malaria. The donated blood is tested for signs of infectious diseases, such as HIV and hepatitis. The blood is then tested to be sure it is compatible with you in order to minimize the chance of a transfusion reaction. If you or a relative donates blood, this is often done in anticipation of surgery and is not appropriate for emergency situations. It takes many days to process the donated blood. RISKS AND COMPLICATIONS Although transfusion therapy is very safe and saves many lives, the main dangers of transfusion include:   Getting an infectious disease.  Developing a transfusion reaction. This is an allergic reaction to something in the blood you were given. Every precaution is taken to prevent this. The decision to have a blood transfusion has been considered carefully by your caregiver before blood is given. Blood is not given unless the benefits  outweigh the risks. AFTER THE TRANSFUSION  Right after receiving a blood transfusion, you will usually feel much better and more energetic. This is especially true if your red blood cells have gotten low (anemic). The transfusion raises the level of the red blood cells which carry oxygen, and this usually causes an energy increase.  The nurse administering the transfusion will monitor you carefully for complications. HOME CARE INSTRUCTIONS  No special instructions are needed after a transfusion. You may find your energy is better. Speak with your caregiver about any limitations on activity for underlying diseases you may have. SEEK MEDICAL CARE IF:   Your condition is not improving after your transfusion.  You develop redness or irritation at the intravenous (IV) site. SEEK IMMEDIATE MEDICAL CARE IF:  Any of the following symptoms occur over the next 12 hours:  Shaking chills.  You have a  temperature by mouth above 102 F (38.9 C), not controlled by medicine.  Chest, back, or muscle pain.  People around you feel you are not acting correctly or are confused.  Shortness of breath or difficulty breathing.  Dizziness and fainting.  You get a rash or develop hives.  You have a decrease in urine output.  Your urine turns a dark color or changes to pink, red, or brown. Any of the following symptoms occur over the next 10 days:  You have a temperature by mouth above 102 F (38.9 C), not controlled by medicine.  Shortness of breath.  Weakness after normal activity.  The white part of the eye turns yellow (jaundice).  You have a decrease in the amount of urine or are urinating less often.  Your urine turns a dark color or changes to pink, red, or brown. Document Released: 05/15/2000 Document Revised: 08/10/2011 Document Reviewed: 01/02/2008 ExitCare Patient Information 2014 ExitCare, [place].  _______________________________________________________________________How to Manage Your Diabetes Before and After Surgery  Why is it important to control my blood sugar before and after surgery? . Improving blood sugar levels before and after surgery helps healing and can limit problems. . A way of improving blood sugar control is eating a healthy diet by: o  Eating less sugar and carbohydrates o  Increasing activity/exercise o  Talking with your doctor about reaching your blood sugar goals . High blood sugars (greater than 180 mg/dL) can raise your risk of infections and slow your recovery, so you will need to focus on controlling your diabetes during the weeks before surgery. . Make sure that the doctor who takes care of your diabetes knows about your planned surgery including the date and location.  How do I manage my blood sugar before surgery? . Check your blood sugar at least 4 times a day, starting 2 days before surgery, to make sure that the level is not too high or  low. o Check your blood sugar the morning of your surgery when you wake up and every 2 hours until you get to the Short Stay unit. . If your blood sugar is less than 70 mg/dL, you will need to treat for low blood sugar: o Do not take insulin. o Treat a low blood sugar (less than 70 mg/dL) with  cup of clear juice (cranberry or apple), 4 glucose tablets, OR glucose gel. o Recheck blood sugar in 15 minutes after treatment (to make sure it is greater than 70 mg/dL). If your blood sugar is not greater than 70 mg/dL on recheck, call [phone] for further instructions. . Report your blood sugar to the  short stay nurse when you get to Short Stay.  . If you are admitted to the hospital after surgery: o Your blood sugar will be checked by the staff and you will probably be given insulin after surgery (instead of oral diabetes medicines) to make sure you have good blood sugar levels. o The goal for blood sugar control after surgery is 80-180 mg/dL.   WHAT DO I DO ABOUT MY DIABETES MEDICATION?  Marland Kitchen Do not take oral diabetes medicines (pills) the morning of surgery.   .   .  .      Patient Signature:  Date:   Nurse Signature:  Date:   Reviewed and Endorsed by [facility] Patient Education Committee, August 2015

## 2016-05-21 ENCOUNTER — Encounter (HOSPITAL_COMMUNITY)
Admission: RE | Admit: 2016-05-21 | Discharge: 2016-05-21 | Disposition: A | Discharge status: Home / Self Care | Payer: Commercial Managed Care - HMO | Source: Ambulatory Visit | Attending: Surgery | Admitting: Surgery

## 2016-05-21 ENCOUNTER — Encounter (HOSPITAL_COMMUNITY): Payer: Self-pay

## 2016-05-21 DIAGNOSIS — G3184 Mild cognitive impairment, so stated: Secondary | ICD-10-CM | POA: Diagnosis not present

## 2016-05-21 DIAGNOSIS — E1140 Type 2 diabetes mellitus with diabetic neuropathy, unspecified: Secondary | ICD-10-CM | POA: Diagnosis not present

## 2016-05-21 DIAGNOSIS — M48061 Spinal stenosis, lumbar region without neurogenic claudication: Secondary | ICD-10-CM | POA: Diagnosis not present

## 2016-05-21 DIAGNOSIS — Z01812 Encounter for preprocedural laboratory examination: Secondary | ICD-10-CM | POA: Insufficient documentation

## 2016-05-21 DIAGNOSIS — I2510 Atherosclerotic heart disease of native coronary artery without angina pectoris: Secondary | ICD-10-CM | POA: Diagnosis not present

## 2016-05-21 DIAGNOSIS — Z0183 Encounter for blood typing: Secondary | ICD-10-CM | POA: Insufficient documentation

## 2016-05-21 DIAGNOSIS — I5032 Chronic diastolic (congestive) heart failure: Secondary | ICD-10-CM | POA: Diagnosis not present

## 2016-05-21 DIAGNOSIS — I110 Hypertensive heart disease with heart failure: Secondary | ICD-10-CM | POA: Diagnosis not present

## 2016-05-21 DIAGNOSIS — I482 Chronic atrial fibrillation: Secondary | ICD-10-CM | POA: Diagnosis not present

## 2016-05-21 DIAGNOSIS — D128 Benign neoplasm of rectum: Secondary | ICD-10-CM | POA: Insufficient documentation

## 2016-05-21 DIAGNOSIS — I272 Pulmonary hypertension, unspecified: Secondary | ICD-10-CM | POA: Diagnosis not present

## 2016-05-21 DIAGNOSIS — J449 Chronic obstructive pulmonary disease, unspecified: Secondary | ICD-10-CM | POA: Diagnosis not present

## 2016-05-21 HISTORY — DX: Unspecified osteoarthritis, unspecified site: M19.90

## 2016-05-21 LAB — BASIC METABOLIC PANEL
Anion gap: 7 (ref 5–15)
BUN: 15 mg/dL (ref 6–20)
CO2: 28 mmol/L (ref 22–32)
Calcium: 10.2 mg/dL (ref 8.9–10.3)
Chloride: 106 mmol/L (ref 101–111)
Creatinine, Ser: 1.1 mg/dL (ref 0.61–1.24)
GFR calc Af Amer: >60 mL/min (ref >60)
GFR calc non Af Amer: >60 mL/min (ref >60)
Glucose, Bld: 162 mg/dL — ABNORMAL HIGH (ref 65–99)
Potassium: 4.1 mmol/L (ref 3.5–5.1)
Sodium: 141 mmol/L (ref 135–145)

## 2016-05-21 LAB — ABO/RH: ABO/RH(D): O POS

## 2016-05-21 LAB — CBC
HCT: 46.4 % (ref 39.0–52.0)
Hemoglobin: 14.8 g/dL (ref 13.0–17.0)
MCH: 28.4 pg (ref 26.0–34.0)
MCHC: 31.9 g/dL (ref 30.0–36.0)
MCV: 89.1 fL (ref 78.0–100.0)
Platelets: 138 10*3/uL — ABNORMAL LOW (ref 150–400)
RBC: 5.21 MIL/uL (ref 4.22–5.81)
RDW: 14 % (ref 11.5–15.5)
WBC: 6.1 10*3/uL (ref 4.0–10.5)

## 2016-05-21 LAB — GLUCOSE, CAPILLARY: Glucose-Capillary: 266 mg/dL — ABNORMAL HIGH (ref 65–99)

## 2016-05-21 LAB — PROTIME-INR
INR: 2.99
Prothrombin Time: 31.7 seconds — ABNORMAL HIGH (ref 11.4–15.2)

## 2016-05-21 NOTE — Progress Notes (Signed)
Spoke with Matthew Edwards ( daughter by phone ) to clarify when patient takes Metoprolol.  Daughter reported patient takes Metoprolol daily in am. Instructed Matthew Edwards for patient to take Metoprolol am of surgery.  Daughter voiced understanding.   Daughter also stated when asked about bowel prep instructions that bowel prep instructions stated patient is to take bottle of Milk of Magnesia day before surgery.  Called office of [facility] and spoke with Matthew Edwards  ( Triage Nurse)and informed that Matthew Edwards , daughter, stated bowel prep instructions on refrigerator state for patient's bowel prep to take bottle of Milk of Magnesia day before surgery,  Matthew Edwards states that chart copy of bowel prep at [facility] states as in epic 's bowel prep instructions.   Matthew Edwards, Triage Nurse at [facility] to call daughter, Matthew Edwards at [phone] and clarify bowel prep instructions and review.

## 2016-05-21 NOTE — Progress Notes (Signed)
PT/INR done 05/21/16 faxed via EPIC to Dr Johney [place].  Will repeat PT/INR am of surgery

## 2016-05-21 NOTE — Progress Notes (Signed)
04/29/16- LOV with cardiology- pic along with ekg  08/03/14- echo-epic  09/18/15- CXR- EPIC

## 2016-05-22 DIAGNOSIS — I2510 Atherosclerotic heart disease of native coronary artery without angina pectoris: Secondary | ICD-10-CM | POA: Diagnosis not present

## 2016-05-22 DIAGNOSIS — I272 Pulmonary hypertension, unspecified: Secondary | ICD-10-CM | POA: Diagnosis not present

## 2016-05-22 DIAGNOSIS — M48061 Spinal stenosis, lumbar region without neurogenic claudication: Secondary | ICD-10-CM | POA: Diagnosis not present

## 2016-05-22 DIAGNOSIS — G3184 Mild cognitive impairment, so stated: Secondary | ICD-10-CM | POA: Diagnosis not present

## 2016-05-22 DIAGNOSIS — E1140 Type 2 diabetes mellitus with diabetic neuropathy, unspecified: Secondary | ICD-10-CM | POA: Diagnosis not present

## 2016-05-22 DIAGNOSIS — I482 Chronic atrial fibrillation: Secondary | ICD-10-CM | POA: Diagnosis not present

## 2016-05-22 DIAGNOSIS — I5032 Chronic diastolic (congestive) heart failure: Secondary | ICD-10-CM | POA: Diagnosis not present

## 2016-05-22 DIAGNOSIS — J449 Chronic obstructive pulmonary disease, unspecified: Secondary | ICD-10-CM | POA: Diagnosis not present

## 2016-05-22 DIAGNOSIS — I110 Hypertensive heart disease with heart failure: Secondary | ICD-10-CM | POA: Diagnosis not present

## 2016-05-22 LAB — HEMOGLOBIN A1C
Hgb A1c MFr Bld: 8 % — ABNORMAL HIGH (ref 4.8–5.6)
Mean Plasma Glucose: 183 mg/dL

## 2016-05-22 NOTE — Progress Notes (Signed)
Spoke with Tyler Pita ( Triage Nurse ) at [facility] and she stated she had spoken with daughter, Deirdre and had emailed the correct bowel prep instructions to daughter, Deirdre and Deirdre has correct bowel prep instructions for patient for surgery on 05/26/2016.

## 2016-05-22 NOTE — Progress Notes (Signed)
HGBA1C done 05/21/2016 faxed via EPIC to DR Gross.

## 2016-05-26 ENCOUNTER — Ambulatory Visit (HOSPITAL_COMMUNITY): Payer: Commercial Managed Care - HMO | Admitting: Certified Registered Nurse Anesthetist

## 2016-05-26 ENCOUNTER — Observation Stay (HOSPITAL_COMMUNITY): Payer: Commercial Managed Care - HMO

## 2016-05-26 ENCOUNTER — Encounter (HOSPITAL_COMMUNITY)
Admission: AD | Disposition: A | Discharge status: Home / Self Care | Payer: Self-pay | Source: Ambulatory Visit | Attending: Surgery

## 2016-05-26 ENCOUNTER — Encounter (HOSPITAL_COMMUNITY): Payer: Self-pay | Admitting: Registered Nurse

## 2016-05-26 ENCOUNTER — Inpatient Hospital Stay (HOSPITAL_COMMUNITY)
Admission: AD | Admit: 2016-05-26 | Discharge: 2016-05-29 | DRG: 332 | Disposition: A | Discharge status: Home Health | Payer: Commercial Managed Care - HMO | Source: Ambulatory Visit | Attending: Surgery | Admitting: Surgery

## 2016-05-26 DIAGNOSIS — I2510 Atherosclerotic heart disease of native coronary artery without angina pectoris: Secondary | ICD-10-CM | POA: Diagnosis present

## 2016-05-26 DIAGNOSIS — R413 Other amnesia: Secondary | ICD-10-CM | POA: Diagnosis present

## 2016-05-26 DIAGNOSIS — E785 Hyperlipidemia, unspecified: Secondary | ICD-10-CM | POA: Diagnosis present

## 2016-05-26 DIAGNOSIS — E118 Type 2 diabetes mellitus with unspecified complications: Secondary | ICD-10-CM | POA: Diagnosis present

## 2016-05-26 DIAGNOSIS — Z79899 Other long term (current) drug therapy: Secondary | ICD-10-CM

## 2016-05-26 DIAGNOSIS — Z87891 Personal history of nicotine dependence: Secondary | ICD-10-CM

## 2016-05-26 DIAGNOSIS — I272 Pulmonary hypertension, unspecified: Secondary | ICD-10-CM | POA: Diagnosis present

## 2016-05-26 DIAGNOSIS — I5033 Acute on chronic diastolic (congestive) heart failure: Secondary | ICD-10-CM | POA: Diagnosis present

## 2016-05-26 DIAGNOSIS — Z91018 Allergy to other foods: Secondary | ICD-10-CM

## 2016-05-26 DIAGNOSIS — G3184 Mild cognitive impairment, so stated: Secondary | ICD-10-CM | POA: Diagnosis not present

## 2016-05-26 DIAGNOSIS — Y838 Other surgical procedures as the cause of abnormal reaction of the patient, or of later complication, without mention of misadventure at the time of the procedure: Secondary | ICD-10-CM | POA: Diagnosis not present

## 2016-05-26 DIAGNOSIS — G4733 Obstructive sleep apnea (adult) (pediatric): Secondary | ICD-10-CM | POA: Diagnosis present

## 2016-05-26 DIAGNOSIS — K5909 Other constipation: Secondary | ICD-10-CM | POA: Diagnosis present

## 2016-05-26 DIAGNOSIS — E1140 Type 2 diabetes mellitus with diabetic neuropathy, unspecified: Secondary | ICD-10-CM | POA: Diagnosis not present

## 2016-05-26 DIAGNOSIS — E210 Primary hyperparathyroidism: Secondary | ICD-10-CM | POA: Diagnosis present

## 2016-05-26 DIAGNOSIS — Z951 Presence of aortocoronary bypass graft: Secondary | ICD-10-CM

## 2016-05-26 DIAGNOSIS — I482 Chronic atrial fibrillation, unspecified: Secondary | ICD-10-CM | POA: Diagnosis present

## 2016-05-26 DIAGNOSIS — K5901 Slow transit constipation: Secondary | ICD-10-CM

## 2016-05-26 DIAGNOSIS — D128 Benign neoplasm of rectum: Secondary | ICD-10-CM

## 2016-05-26 DIAGNOSIS — Z953 Presence of xenogenic heart valve: Secondary | ICD-10-CM

## 2016-05-26 DIAGNOSIS — J42 Unspecified chronic bronchitis: Secondary | ICD-10-CM | POA: Diagnosis not present

## 2016-05-26 DIAGNOSIS — K649 Unspecified hemorrhoids: Secondary | ICD-10-CM | POA: Diagnosis present

## 2016-05-26 DIAGNOSIS — K621 Rectal polyp: Principal | ICD-10-CM | POA: Diagnosis present

## 2016-05-26 DIAGNOSIS — Z7901 Long term (current) use of anticoagulants: Secondary | ICD-10-CM

## 2016-05-26 DIAGNOSIS — K6289 Other specified diseases of anus and rectum: Secondary | ICD-10-CM | POA: Diagnosis present

## 2016-05-26 DIAGNOSIS — K219 Gastro-esophageal reflux disease without esophagitis: Secondary | ICD-10-CM | POA: Diagnosis present

## 2016-05-26 DIAGNOSIS — I110 Hypertensive heart disease with heart failure: Secondary | ICD-10-CM | POA: Diagnosis present

## 2016-05-26 DIAGNOSIS — I509 Heart failure, unspecified: Secondary | ICD-10-CM | POA: Diagnosis present

## 2016-05-26 DIAGNOSIS — Z4659 Encounter for fitting and adjustment of other gastrointestinal appliance and device: Secondary | ICD-10-CM

## 2016-05-26 DIAGNOSIS — E559 Vitamin D deficiency, unspecified: Secondary | ICD-10-CM | POA: Diagnosis present

## 2016-05-26 DIAGNOSIS — Z955 Presence of coronary angioplasty implant and graft: Secondary | ICD-10-CM

## 2016-05-26 DIAGNOSIS — J449 Chronic obstructive pulmonary disease, unspecified: Secondary | ICD-10-CM | POA: Diagnosis present

## 2016-05-26 DIAGNOSIS — E872 Acidosis: Secondary | ICD-10-CM | POA: Diagnosis not present

## 2016-05-26 DIAGNOSIS — J95821 Acute postprocedural respiratory failure: Secondary | ICD-10-CM | POA: Diagnosis not present

## 2016-05-26 DIAGNOSIS — Y92239 Unspecified place in hospital as the place of occurrence of the external cause: Secondary | ICD-10-CM | POA: Diagnosis not present

## 2016-05-26 DIAGNOSIS — R4189 Other symptoms and signs involving cognitive functions and awareness: Secondary | ICD-10-CM | POA: Diagnosis present

## 2016-05-26 DIAGNOSIS — K6282 Dysplasia of anus: Secondary | ICD-10-CM | POA: Diagnosis not present

## 2016-05-26 DIAGNOSIS — Z4682 Encounter for fitting and adjustment of non-vascular catheter: Secondary | ICD-10-CM | POA: Diagnosis not present

## 2016-05-26 DIAGNOSIS — Z789 Other specified health status: Secondary | ICD-10-CM

## 2016-05-26 HISTORY — PX: PARTIAL PROCTECTOMY BY TEM: SHX6011

## 2016-05-26 LAB — BLOOD GAS, ARTERIAL
Acid-Base Excess: 2.9 mmol/L — ABNORMAL HIGH (ref 0.0–2.0)
Bicarbonate: 26.7 mmol/L (ref 20.0–28.0)
Drawn by: 295031
FIO2: 40
MECHVT: 550 mL
O2 Saturation: 93.1 %
PEEP: 5 cmH2O
Patient temperature: 98.6
RATE: 18 {breaths}/min
pCO2 arterial: 39.9 mmHg (ref 32.0–48.0)
pH, Arterial: 7.441 (ref 7.350–7.450)
pO2, Arterial: 67 mmHg — ABNORMAL LOW (ref 83.0–108.0)

## 2016-05-26 LAB — GLUCOSE, CAPILLARY
Glucose-Capillary: 178 mg/dL — ABNORMAL HIGH (ref 65–99)
Glucose-Capillary: 188 mg/dL — ABNORMAL HIGH (ref 65–99)
Glucose-Capillary: 190 mg/dL — ABNORMAL HIGH (ref 65–99)
Glucose-Capillary: 198 mg/dL — ABNORMAL HIGH (ref 65–99)
Glucose-Capillary: 223 mg/dL — ABNORMAL HIGH (ref 65–99)

## 2016-05-26 LAB — PROTIME-INR
INR: 1.26
Prothrombin Time: 15.8 seconds — ABNORMAL HIGH (ref 11.4–15.2)

## 2016-05-26 LAB — MRSA PCR SCREENING: MRSA by PCR: NEGATIVE

## 2016-05-26 LAB — TYPE AND SCREEN
ABO/RH(D): O POS
Antibody Screen: NEGATIVE

## 2016-05-26 LAB — PROCALCITONIN: Procalcitonin: <0.1 ng/mL

## 2016-05-26 SURGERY — PARTIAL PROCTECTOMY BY TEM
Anesthesia: General

## 2016-05-26 MED ORDER — SODIUM CHLORIDE 0.9% FLUSH
3.0000 mL | Freq: Two times a day (BID) | INTRAVENOUS | Status: DC
  Administered 2016-05-27 – 2016-05-28 (×3): 3 mL via INTRAVENOUS

## 2016-05-26 MED ORDER — INSULIN ASPART 100 UNIT/ML ~~LOC~~ SOLN
0.0000 [IU] | SUBCUTANEOUS | Status: DC
  Administered 2016-05-26: 4 [IU] via SUBCUTANEOUS
  Administered 2016-05-26: 7 [IU] via SUBCUTANEOUS
  Administered 2016-05-26: 3 [IU] via SUBCUTANEOUS
  Administered 2016-05-27 (×2): 7 [IU] via SUBCUTANEOUS

## 2016-05-26 MED ORDER — INSULIN ASPART 100 UNIT/ML ~~LOC~~ SOLN: 0.0000 [IU] | Freq: Three times a day (TID) | SUBCUTANEOUS | Status: DC

## 2016-05-26 MED ORDER — FENTANYL CITRATE (PF) 250 MCG/5ML IJ SOLN
INTRAMUSCULAR | Status: AC
Start: 2016-05-26 — End: 2016-05-26
  Filled 2016-05-26: qty 5

## 2016-05-26 MED ORDER — OXYCODONE HCL 5 MG/5ML PO SOLN: 5.0000 mg | Freq: Once | ORAL | Status: DC | PRN

## 2016-05-26 MED ORDER — HYDROMORPHONE HCL 1 MG/ML IJ SOLN
INTRAMUSCULAR | Status: AC
  Filled 2016-05-26: qty 1

## 2016-05-26 MED ORDER — VITAMIN C 500 MG PO TABS
500.0000 mg | ORAL_TABLET | Freq: Every morning | ORAL | Status: DC
  Administered 2016-05-28 – 2016-05-29 (×2): 500 mg via ORAL
  Filled 2016-05-26 (×2): qty 1

## 2016-05-26 MED ORDER — NITROGLYCERIN 0.4 MG SL SUBL: 0.4000 mg | SUBLINGUAL_TABLET | SUBLINGUAL | Status: DC | PRN

## 2016-05-26 MED ORDER — ONDANSETRON HCL 4 MG/2ML IJ SOLN: 4.0000 mg | Freq: Four times a day (QID) | INTRAMUSCULAR | Status: DC | PRN

## 2016-05-26 MED ORDER — MAGIC MOUTHWASH
15.0000 mL | Freq: Four times a day (QID) | ORAL | Status: DC | PRN
  Filled 2016-05-26: qty 15

## 2016-05-26 MED ORDER — SODIUM CHLORIDE 0.9 % IV SOLN: 250.0000 mL | INTRAVENOUS | Status: DC | PRN

## 2016-05-26 MED ORDER — SODIUM CHLORIDE 0.9% FLUSH: 3.0000 mL | Freq: Two times a day (BID) | INTRAVENOUS | Status: DC

## 2016-05-26 MED ORDER — FENTANYL CITRATE (PF) 100 MCG/2ML IJ SOLN: 50.0000 ug | INTRAMUSCULAR | Status: DC | PRN

## 2016-05-26 MED ORDER — PHENYLEPHRINE 40 MCG/ML (10ML) SYRINGE FOR IV PUSH (FOR BLOOD PRESSURE SUPPORT)
PREFILLED_SYRINGE | INTRAVENOUS | Status: AC
  Filled 2016-05-26: qty 10

## 2016-05-26 MED ORDER — SIMETHICONE 80 MG PO CHEW: 40.0000 mg | CHEWABLE_TABLET | Freq: Four times a day (QID) | ORAL | Status: DC | PRN

## 2016-05-26 MED ORDER — ALBUTEROL SULFATE (2.5 MG/3ML) 0.083% IN NEBU: 3.0000 mL | INHALATION_SOLUTION | Freq: Four times a day (QID) | RESPIRATORY_TRACT | Status: DC | PRN

## 2016-05-26 MED ORDER — BUPIVACAINE LIPOSOME 1.3 % IJ SUSP
INTRAMUSCULAR | Status: DC | PRN
  Administered 2016-05-26: 20 mL

## 2016-05-26 MED ORDER — GLIPIZIDE 10 MG PO TABS
10.0000 mg | ORAL_TABLET | Freq: Every day | ORAL | Status: DC
  Filled 2016-05-26 (×4): qty 1

## 2016-05-26 MED ORDER — METOPROLOL SUCCINATE ER 25 MG PO TB24
25.0000 mg | ORAL_TABLET | Freq: Every day | ORAL | Status: DC
  Administered 2016-05-28 – 2016-05-29 (×2): 25 mg via ORAL
  Filled 2016-05-26 (×2): qty 1

## 2016-05-26 MED ORDER — DEXAMETHASONE SODIUM PHOSPHATE 10 MG/ML IJ SOLN
INTRAMUSCULAR | Status: AC
  Filled 2016-05-26: qty 1

## 2016-05-26 MED ORDER — EPHEDRINE 5 MG/ML INJ
INTRAVENOUS | Status: AC
  Filled 2016-05-26: qty 10

## 2016-05-26 MED ORDER — PHENOL 1.4 % MT LIQD
2.0000 | OROMUCOSAL | Status: DC | PRN
  Filled 2016-05-26: qty 177

## 2016-05-26 MED ORDER — HYDRALAZINE HCL 20 MG/ML IJ SOLN: 10.0000 mg | INTRAMUSCULAR | Status: DC | PRN

## 2016-05-26 MED ORDER — SUGAMMADEX SODIUM 200 MG/2ML IV SOLN
INTRAVENOUS | Status: DC | PRN
  Administered 2016-05-26: 200 mg via INTRAVENOUS

## 2016-05-26 MED ORDER — 0.9 % SODIUM CHLORIDE (POUR BTL) OPTIME
TOPICAL | Status: DC | PRN
  Administered 2016-05-26: 1000 mL

## 2016-05-26 MED ORDER — ADULT MULTIVITAMIN W/MINERALS CH
1.0000 | ORAL_TABLET | Freq: Every evening | ORAL | Status: DC
  Administered 2016-05-26 – 2016-05-28 (×2): 1 via ORAL
  Filled 2016-05-26 (×3): qty 1

## 2016-05-26 MED ORDER — ROCURONIUM BROMIDE 50 MG/5ML IV SOSY
PREFILLED_SYRINGE | INTRAVENOUS | Status: DC | PRN
  Administered 2016-05-26: 50 mg via INTRAVENOUS
  Administered 2016-05-26 (×3): 10 mg via INTRAVENOUS

## 2016-05-26 MED ORDER — SUGAMMADEX SODIUM 200 MG/2ML IV SOLN
INTRAVENOUS | Status: AC
  Filled 2016-05-26: qty 2

## 2016-05-26 MED ORDER — ATORVASTATIN CALCIUM 20 MG PO TABS
40.0000 mg | ORAL_TABLET | Freq: Every day | ORAL | Status: DC
  Administered 2016-05-26 – 2016-05-28 (×3): 40 mg via ORAL
  Filled 2016-05-26: qty 1
  Filled 2016-05-26: qty 2
  Filled 2016-05-26: qty 1

## 2016-05-26 MED ORDER — BENAZEPRIL HCL 20 MG PO TABS
20.0000 mg | ORAL_TABLET | Freq: Every day | ORAL | Status: DC
  Administered 2016-05-28 – 2016-05-29 (×2): 20 mg via ORAL
  Filled 2016-05-26 (×5): qty 1

## 2016-05-26 MED ORDER — PROPOFOL 10 MG/ML IV BOLUS
INTRAVENOUS | Status: DC | PRN
  Administered 2016-05-26: 180 mg via INTRAVENOUS

## 2016-05-26 MED ORDER — METHOCARBAMOL 500 MG PO TABS: 1000.0000 mg | ORAL_TABLET | Freq: Four times a day (QID) | ORAL | Status: DC | PRN

## 2016-05-26 MED ORDER — POLYETHYLENE GLYCOL 3350 17 GM/SCOOP PO POWD: 17.0000 g | Freq: Two times a day (BID) | ORAL | 10 refills | Status: DC | PRN

## 2016-05-26 MED ORDER — ENOXAPARIN SODIUM 40 MG/0.4ML ~~LOC~~ SOLN
40.0000 mg | SUBCUTANEOUS | Status: DC
Start: 2016-05-27 — End: 2016-05-29
  Administered 2016-05-28 – 2016-05-29 (×2): 40 mg via SUBCUTANEOUS
  Filled 2016-05-26 (×3): qty 0.4

## 2016-05-26 MED ORDER — LIDOCAINE 2% (20 MG/ML) 5 ML SYRINGE
INTRAMUSCULAR | Status: AC
  Filled 2016-05-26: qty 5

## 2016-05-26 MED ORDER — SUCCINYLCHOLINE CHLORIDE 200 MG/10ML IV SOSY
PREFILLED_SYRINGE | INTRAVENOUS | Status: DC | PRN
  Administered 2016-05-26: 140 mg via INTRAVENOUS

## 2016-05-26 MED ORDER — GABAPENTIN 300 MG PO CAPS
300.0000 mg | ORAL_CAPSULE | ORAL | Status: AC
  Administered 2016-05-26: 300 mg via ORAL
  Filled 2016-05-26: qty 1

## 2016-05-26 MED ORDER — HYDRALAZINE HCL 20 MG/ML IJ SOLN
10.0000 mg | INTRAMUSCULAR | Status: DC | PRN
  Administered 2016-05-26: 10 mg via INTRAVENOUS
  Filled 2016-05-26: qty 1

## 2016-05-26 MED ORDER — POLYETHYLENE GLYCOL 3350 17 G PO PACK
17.0000 g | PACK | Freq: Two times a day (BID) | ORAL | Status: DC
  Administered 2016-05-26: 17 g via ORAL
  Filled 2016-05-26: qty 1

## 2016-05-26 MED ORDER — FUROSEMIDE 40 MG PO TABS
40.0000 mg | ORAL_TABLET | Freq: Two times a day (BID) | ORAL | Status: DC
  Administered 2016-05-26 – 2016-05-29 (×5): 40 mg via ORAL
  Filled 2016-05-26 (×6): qty 1

## 2016-05-26 MED ORDER — FENTANYL CITRATE (PF) 100 MCG/2ML IJ SOLN
50.0000 ug | INTRAMUSCULAR | Status: DC | PRN
  Administered 2016-05-26: 50 ug via INTRAVENOUS
  Filled 2016-05-26: qty 2

## 2016-05-26 MED ORDER — FAMOTIDINE IN NACL 20-0.9 MG/50ML-% IV SOLN
20.0000 mg | Freq: Two times a day (BID) | INTRAVENOUS | Status: DC
  Administered 2016-05-26 – 2016-05-27 (×2): 20 mg via INTRAVENOUS
  Filled 2016-05-26 (×2): qty 50

## 2016-05-26 MED ORDER — LACTATED RINGERS IV SOLN
INTRAVENOUS | Status: DC | PRN
  Administered 2016-05-26: 11:00:00 via INTRAVENOUS

## 2016-05-26 MED ORDER — OXYCODONE HCL 5 MG PO TABS: 5.0000 mg | ORAL_TABLET | ORAL | Status: DC | PRN

## 2016-05-26 MED ORDER — BUPIVACAINE HCL (PF) 0.25 % IJ SOLN
INTRAMUSCULAR | Status: DC | PRN
  Administered 2016-05-26: 30 mL

## 2016-05-26 MED ORDER — DIPHENHYDRAMINE HCL 12.5 MG/5ML PO ELIX: 12.5000 mg | ORAL_SOLUTION | Freq: Four times a day (QID) | ORAL | Status: DC | PRN

## 2016-05-26 MED ORDER — BUPIVACAINE HCL (PF) 0.25 % IJ SOLN
INTRAMUSCULAR | Status: AC
  Filled 2016-05-26: qty 30

## 2016-05-26 MED ORDER — DIPHENHYDRAMINE HCL 50 MG/ML IJ SOLN: 12.5000 mg | Freq: Four times a day (QID) | INTRAMUSCULAR | Status: DC | PRN

## 2016-05-26 MED ORDER — ENOXAPARIN SODIUM 40 MG/0.4ML ~~LOC~~ SOLN
40.0000 mg | Freq: Once | SUBCUTANEOUS | Status: AC
  Administered 2016-05-26: 40 mg via SUBCUTANEOUS
  Filled 2016-05-26: qty 0.4

## 2016-05-26 MED ORDER — MOMETASONE FURO-FORMOTEROL FUM 100-5 MCG/ACT IN AERO
2.0000 | INHALATION_SPRAY | Freq: Two times a day (BID) | RESPIRATORY_TRACT | Status: DC
  Administered 2016-05-26 – 2016-05-28 (×3): 2 via RESPIRATORY_TRACT
  Filled 2016-05-26: qty 8.8

## 2016-05-26 MED ORDER — OXYCODONE HCL 5 MG PO TABS: 5.0000 mg | ORAL_TABLET | Freq: Once | ORAL | Status: DC | PRN

## 2016-05-26 MED ORDER — ORAL CARE MOUTH RINSE
15.0000 mL | Freq: Four times a day (QID) | OROMUCOSAL | Status: DC
  Administered 2016-05-26 – 2016-05-27 (×2): 15 mL via OROMUCOSAL

## 2016-05-26 MED ORDER — METHOCARBAMOL 500 MG PO TABS: 500.0000 mg | ORAL_TABLET | Freq: Four times a day (QID) | ORAL | Status: DC | PRN

## 2016-05-26 MED ORDER — LIDOCAINE 2% (20 MG/ML) 5 ML SYRINGE
INTRAMUSCULAR | Status: DC | PRN
  Administered 2016-05-26: 100 mg via INTRAVENOUS

## 2016-05-26 MED ORDER — ONDANSETRON HCL 4 MG/2ML IJ SOLN
INTRAMUSCULAR | Status: AC
  Filled 2016-05-26: qty 2

## 2016-05-26 MED ORDER — PROPOFOL 10 MG/ML IV BOLUS
INTRAVENOUS | Status: AC
  Filled 2016-05-26: qty 20

## 2016-05-26 MED ORDER — DEXAMETHASONE SODIUM PHOSPHATE 10 MG/ML IJ SOLN
INTRAMUSCULAR | Status: DC | PRN
  Administered 2016-05-26: 4 mg via INTRAVENOUS

## 2016-05-26 MED ORDER — ALBUTEROL SULFATE (2.5 MG/3ML) 0.083% IN NEBU
2.5000 mg | INHALATION_SOLUTION | Freq: Once | RESPIRATORY_TRACT | Status: AC
  Administered 2016-05-26: 2.5 mg via RESPIRATORY_TRACT

## 2016-05-26 MED ORDER — LACTATED RINGERS IV BOLUS (SEPSIS): 1000.0000 mL | Freq: Three times a day (TID) | INTRAVENOUS | Status: DC | PRN

## 2016-05-26 MED ORDER — ONDANSETRON 4 MG PO TBDP: 4.0000 mg | ORAL_TABLET | Freq: Four times a day (QID) | ORAL | Status: DC | PRN

## 2016-05-26 MED ORDER — OXYCODONE HCL 5 MG PO TABS: 5.0000 mg | ORAL_TABLET | Freq: Four times a day (QID) | ORAL | 0 refills | Status: DC | PRN

## 2016-05-26 MED ORDER — HYDROMORPHONE HCL 1 MG/ML IJ SOLN
0.2500 mg | INTRAMUSCULAR | Status: DC | PRN
  Administered 2016-05-26: 0.5 mg via INTRAVENOUS
  Administered 2016-05-26: 0.25 mg via INTRAVENOUS

## 2016-05-26 MED ORDER — PHENYLEPHRINE HCL 10 MG/ML IJ SOLN
INTRAMUSCULAR | Status: DC | PRN
  Administered 2016-05-26: 80 ug via INTRAVENOUS
  Administered 2016-05-26 (×3): 120 ug via INTRAVENOUS
  Administered 2016-05-26: 40 ug via INTRAVENOUS
  Administered 2016-05-26: 80 ug via INTRAVENOUS

## 2016-05-26 MED ORDER — FENTANYL CITRATE (PF) 100 MCG/2ML IJ SOLN
25.0000 ug | INTRAMUSCULAR | Status: DC | PRN
  Administered 2016-05-27: 50 ug via INTRAVENOUS
  Filled 2016-05-26: qty 2

## 2016-05-26 MED ORDER — ALBUTEROL SULFATE (2.5 MG/3ML) 0.083% IN NEBU
INHALATION_SOLUTION | RESPIRATORY_TRACT | Status: AC
  Filled 2016-05-26: qty 3

## 2016-05-26 MED ORDER — DEXMEDETOMIDINE HCL IN NACL 200 MCG/50ML IV SOLN
0.4000 ug/kg/h | INTRAVENOUS | Status: DC
  Administered 2016-05-26: 0.5 ug/kg/h via INTRAVENOUS
  Administered 2016-05-26 (×3): 0.8 ug/kg/h via INTRAVENOUS
  Administered 2016-05-27 (×2): 0.7 ug/kg/h via INTRAVENOUS
  Filled 2016-05-26 (×6): qty 50

## 2016-05-26 MED ORDER — POTASSIUM CHLORIDE CRYS ER 20 MEQ PO TBCR: 20.0000 meq | EXTENDED_RELEASE_TABLET | Freq: Every day | ORAL | Status: DC

## 2016-05-26 MED ORDER — DEXTROSE 5 % IV SOLN
2.0000 g | INTRAVENOUS | Status: AC
  Administered 2016-05-26: 2 g via INTRAVENOUS
  Filled 2016-05-26: qty 2

## 2016-05-26 MED ORDER — ONDANSETRON HCL 4 MG/2ML IJ SOLN
INTRAMUSCULAR | Status: DC | PRN
  Administered 2016-05-26: 4 mg via INTRAVENOUS

## 2016-05-26 MED ORDER — DEXMEDETOMIDINE HCL IN NACL 200 MCG/50ML IV SOLN
INTRAVENOUS | Status: AC
  Filled 2016-05-26: qty 100

## 2016-05-26 MED ORDER — EPHEDRINE SULFATE 50 MG/ML IJ SOLN
INTRAMUSCULAR | Status: DC | PRN
  Administered 2016-05-26 (×2): 10 mg via INTRAVENOUS

## 2016-05-26 MED ORDER — METOPROLOL SUCCINATE ER 25 MG PO TB24
25.0000 mg | ORAL_TABLET | Freq: Once | ORAL | Status: AC
  Administered 2016-05-26: 25 mg via ORAL
  Filled 2016-05-26: qty 1

## 2016-05-26 MED ORDER — MENTHOL 3 MG MT LOZG: 1.0000 | LOZENGE | OROMUCOSAL | Status: DC | PRN

## 2016-05-26 MED ORDER — STERILE WATER FOR IRRIGATION IR SOLN
Status: DC | PRN
  Administered 2016-05-26: 1000 mL

## 2016-05-26 MED ORDER — LACTATED RINGERS IV SOLN: INTRAVENOUS | Status: DC

## 2016-05-26 MED ORDER — ACETAMINOPHEN 500 MG PO TABS
1000.0000 mg | ORAL_TABLET | Freq: Three times a day (TID) | ORAL | Status: DC
  Administered 2016-05-26 – 2016-05-29 (×7): 1000 mg via ORAL
  Filled 2016-05-26 (×7): qty 2

## 2016-05-26 MED ORDER — FENTANYL CITRATE (PF) 100 MCG/2ML IJ SOLN
INTRAMUSCULAR | Status: DC | PRN
  Administered 2016-05-26 (×2): 50 ug via INTRAVENOUS

## 2016-05-26 MED ORDER — INSULIN ASPART 100 UNIT/ML ~~LOC~~ SOLN: 0.0000 [IU] | Freq: Every day | SUBCUTANEOUS | Status: DC

## 2016-05-26 MED ORDER — MIDAZOLAM HCL 2 MG/2ML IJ SOLN: 1.0000 mg | INTRAMUSCULAR | Status: DC | PRN

## 2016-05-26 MED ORDER — HYDROMORPHONE HCL 1 MG/ML IJ SOLN: 0.5000 mg | INTRAMUSCULAR | Status: DC | PRN

## 2016-05-26 MED ORDER — CEFOTETAN DISODIUM-DEXTROSE 2-2.08 GM-% IV SOLR
INTRAVENOUS | Status: AC
  Filled 2016-05-26: qty 50

## 2016-05-26 MED ORDER — SODIUM CHLORIDE 0.9% FLUSH: 3.0000 mL | INTRAVENOUS | Status: DC | PRN

## 2016-05-26 MED ORDER — CHLORHEXIDINE GLUCONATE 0.12% ORAL RINSE (MEDLINE KIT)
15.0000 mL | Freq: Two times a day (BID) | OROMUCOSAL | Status: DC
  Administered 2016-05-26 – 2016-05-28 (×3): 15 mL via OROMUCOSAL

## 2016-05-26 MED ORDER — BUPIVACAINE LIPOSOME 1.3 % IJ SUSP
20.0000 mL | INTRAMUSCULAR | Status: DC
  Filled 2016-05-26: qty 20

## 2016-05-26 MED ORDER — MIDAZOLAM HCL 2 MG/2ML IJ SOLN
1.0000 mg | INTRAMUSCULAR | Status: DC | PRN
  Administered 2016-05-26: 1 mg via INTRAVENOUS
  Filled 2016-05-26: qty 2

## 2016-05-26 MED ORDER — LIP MEDEX EX OINT
1.0000 "application " | TOPICAL_OINTMENT | Freq: Two times a day (BID) | CUTANEOUS | Status: DC
  Administered 2016-05-26 – 2016-05-29 (×6): 1 via TOPICAL
  Filled 2016-05-26 (×3): qty 7

## 2016-05-26 MED ORDER — POTASSIUM CHLORIDE 20 MEQ/15ML (10%) PO SOLN
20.0000 meq | Freq: Every day | ORAL | Status: DC
  Administered 2016-05-26 – 2016-05-29 (×3): 20 meq via ORAL
  Filled 2016-05-26 (×3): qty 15

## 2016-05-26 MED ORDER — ACETAMINOPHEN 500 MG PO TABS
1000.0000 mg | ORAL_TABLET | ORAL | Status: AC
Start: 2016-05-26 — End: 2016-05-26
  Administered 2016-05-26: 1000 mg via ORAL
  Filled 2016-05-26: qty 2

## 2016-05-26 SURGICAL SUPPLY — 49 items
BLADE SURG 15 STRL LF DISP TIS (BLADE) IMPLANT
BLADE SURG 15 STRL SS (BLADE)
BRIEF STRETCH FOR OB PAD LRG (UNDERPADS AND DIAPERS) IMPLANT
CABLE HIGH FREQUENCY MONO STRZ (ELECTRODE) ×6 IMPLANT
COVER SURGICAL LIGHT HANDLE (MISCELLANEOUS) IMPLANT
DRAPE LAPAROTOMY T 102X78X121 (DRAPES) IMPLANT
DRAPE WARM FLUID 44X44 (DRAPE) ×3 IMPLANT
DRSG PAD ABDOMINAL 8X10 ST (GAUZE/BANDAGES/DRESSINGS) IMPLANT
ELECT PENCIL ROCKER SW 15FT (MISCELLANEOUS) ×3 IMPLANT
ELECT REM PT RETURN 9FT ADLT (ELECTROSURGICAL) ×6
ELECTRODE REM PT RTRN 9FT ADLT (ELECTROSURGICAL) ×2 IMPLANT
GAUZE SPONGE 4X4 12PLY STRL (GAUZE/BANDAGES/DRESSINGS) ×3 IMPLANT
GAUZE SPONGE 4X4 16PLY XRAY LF (GAUZE/BANDAGES/DRESSINGS) ×9 IMPLANT
GLOVE ECLIPSE 8.0 STRL XLNG CF (GLOVE) ×6 IMPLANT
GLOVE INDICATOR 8.0 STRL GRN (GLOVE) ×6 IMPLANT
GOWN STRL REUS W/TWL XL LVL3 (GOWN DISPOSABLE) ×9 IMPLANT
KIT BASIN OR (CUSTOM PROCEDURE TRAY) ×3 IMPLANT
LEGGING LITHOTOMY PAIR STRL (DRAPES) ×3 IMPLANT
LUBRICANT JELLY K Y 4OZ (MISCELLANEOUS) ×3 IMPLANT
NEEDLE HYPO 22GX1.5 SAFETY (NEEDLE) ×6 IMPLANT
PACK BASIC VI WITH GOWN DISP (CUSTOM PROCEDURE TRAY) ×3 IMPLANT
RETRACTOR LONE STAR DISPOSABLE (INSTRUMENTS) ×3 IMPLANT
RETRACTOR STAY HOOK 5MM (MISCELLANEOUS) ×3 IMPLANT
SCISSORS LAP 5X35 DISP (ENDOMECHANICALS) ×3 IMPLANT
SET IRRIG TUBING LAPAROSCOPIC (IRRIGATION / IRRIGATOR) ×3 IMPLANT
SHEARS HARMONIC ACE PLUS 36CM (ENDOMECHANICALS) ×3 IMPLANT
STOPCOCK 4 WAY LG BORE MALE ST (IV SETS) ×3 IMPLANT
SUT CHROMIC 3 0 SH 27 (SUTURE) IMPLANT
SUT PDS AB 2-0 CT2 27 (SUTURE) IMPLANT
SUT PDS AB 3-0 SH 27 (SUTURE) IMPLANT
SUT SILK 2 0 (SUTURE)
SUT SILK 2 0 SH CR/8 (SUTURE) IMPLANT
SUT SILK 2-0 18XBRD TIE 12 (SUTURE) IMPLANT
SUT SILK 3 0 SH 30 (SUTURE) IMPLANT
SUT SILK 3 0 SH CR/8 (SUTURE) IMPLANT
SUT V-LOC BARB 180 2/0GR6 GS22 (SUTURE)
SUT VIC AB 2-0 UR6 27 (SUTURE) IMPLANT
SUT VIC AB 3-0 SH 27 (SUTURE)
SUT VIC AB 3-0 SH 27XBRD (SUTURE) IMPLANT
SUTURE V-LC BRB 180 2/0GR6GS22 (SUTURE) IMPLANT
SYR 20CC LL (SYRINGE) ×3 IMPLANT
SYR BULB IRRIGATION 50ML (SYRINGE) IMPLANT
TOWEL OR 17X26 10 PK STRL BLUE (TOWEL DISPOSABLE) ×3 IMPLANT
TOWEL OR NON WOVEN STRL DISP B (DISPOSABLE) ×3 IMPLANT
TRAY FOLEY W/METER SILVER 16FR (SET/KITS/TRAYS/PACK) ×3 IMPLANT
TUBING CONNECTING 10 (TUBING) ×2 IMPLANT
TUBING CONNECTING 10' (TUBING) ×1
TUBING INSUF HEATED (TUBING) ×3 IMPLANT
YANKAUER SUCT BULB TIP 10FT TU (MISCELLANEOUS) IMPLANT

## 2016-05-26 NOTE — Interval H&P Note (Signed)
History and Physical Interval Note:  05/26/2016 11:55 AM  [name]  has presented today for surgery, with the diagnosis of RECTAL  POLYP WITH HIGH GRADE DYSPLASIA  The various methods of treatment have been discussed with the patient and family. After consideration of risks, benefits and other options for treatment, the patient has consented to  Procedure(s): TEM PARTIAL PROCTECTOMY OF RECTAL MASS (N/A) EXAM UNDER ANESTHESIA (N/A) as a surgical intervention .  The patient's history has been reviewed, patient examined, no change in status, stable for surgery.  I have reviewed the patient's chart and labs.    I updated the patient's status to the patient & her daughter whom has power of attorney.  Patient's daughter frustrated that constipation is not resolved yet & that surgery would not solve that.  Did note that this surgery is to remove the precancerous polyp.  Went over recommendations for increasing MiraLAX.  Recommended that she discuss with Dr Loletha Carrow with gastroenterology afterwards to see another antispasmodics other interventions needed to happen.  She seems somewhat frustrated but at least expressed understanding.  Recommendations were made.  Questions were answered.      Shenae Bonanno C.

## 2016-05-26 NOTE — Consult Note (Signed)
PULMONARY / CRITICAL CARE MEDICINE   Name: AKI ABALOS MRN: [id number] DOB: 25-Nov-1940    ADMISSION DATE:  05/26/2016 CONSULTATION DATE:  05/26/16  REFERRING MD:  [name] MD  CHIEF COMPLAINT:  Vent dependent respiratory failure, status post rectal polyp removal.  HISTORY OF PRESENT ILLNESS:   75 year old with history of aortic wall replacement, coronary artery disease status post CABG, A. Fib on coumadin, COPD, severe OSA, diabetes. He was admitted on 05/26/16 for resection of rectal polyp. Patient was slow to recover from anesthesia and intermittently agitated. Hence was not extubated on PACU. PCCM consulted for management of the vent.  PAST MEDICAL HISTORY :  He  has a past medical history of Arthritis; Atrial fibrillation ([place]); CAD (coronary artery disease) (2012); COPD (chronic obstructive pulmonary disease) ([place]); GERD (gastroesophageal reflux disease); HEART FAILURE, CONGESTIVE UNSPEC (02/23/2007); Hyperlipidemia; Hypertension; OSA (obstructive sleep apnea) (11/13/2014); S/P AVR; Situational depression; Sleep apnea; Stented coronary artery (2013); TOBACCO DEPENDENCE (07/29/2006); and Type II diabetes mellitus ([place]).  PAST SURGICAL HISTORY: He  has a past surgical history that includes Aortic valve replacement (07/2010); Coronary artery bypass graft (07/2010); Coronary angioplasty with stent (2006; 10/2006); and Cardiac catheterization (06/2010).  Allergies  Allergen Reactions  . Pineapple Concentrate Nausea And Vomiting    No current facility-administered medications on file prior to encounter.    Current Outpatient Prescriptions on File Prior to Encounter  Medication Sig  . albuterol (PROVENTIL HFA;VENTOLIN HFA) 108 (90 Base) MCG/ACT inhaler Inhale 2 puffs into the lungs every 6 (six) hours as needed for wheezing or shortness of breath.  Marland Kitchen atorvastatin (LIPITOR) 40 MG tablet Take 1 tablet (40 mg total) by mouth daily at 6 PM.  . benazepril (LOTENSIN) 20 MG tablet Take 1  tablet (20 mg total) by mouth daily.  . budesonide-formoterol (SYMBICORT) 80-4.5 MCG/ACT inhaler Inhale 2 puffs into the lungs 2 (two) times daily.  . Cholecalciferol (VITAMIN D-3 PO) Take 1,000 Units by mouth daily.  . furosemide (LASIX) 40 MG tablet Take 1 tablet (40 mg total) by mouth 2 (two) times daily.  Marland Kitchen glipiZIDE (GLUCOTROL) 10 MG tablet Take 1 tablet by mouth at 5 PM with a meal.  . metoprolol succinate (TOPROL-XL) 25 MG 24 hr tablet Take 1 tablet (25 mg total) by mouth daily. Reported on 11/28/2015  . potassium chloride SA (K-DUR,KLOR-CON) 20 MEQ tablet Take 1 tablet (20 mEq total) by mouth daily.  . vitamin C (ASCORBIC ACID) 500 MG tablet Take 1 tablet (500 mg total) by mouth every morning.  . warfarin (COUMADIN) 5 MG tablet Take 1 tablet (5 mg total) by mouth daily. (Patient taking differently: Take 7.5-10 mg by mouth See admin instructions. 7.5 mg Sun and Fri.  10 mg Mon, Tues, Wed, Thurs and Sat)  . Blood Glucose Monitoring Suppl (ACCU-CHEK AVIVA PLUS) W/DEVICE KIT 1 Units by Does not apply route 3 (three) times daily.  Marland Kitchen glucose blood (ACCU-CHEK AVIVA PLUS) test strip USE TO CHECK BLOOD SUGARS FOUR TIMES DAILY  . nitroGLYCERIN (NITROSTAT) 0.4 MG SL tablet Place 1 tablet (0.4 mg total) under the tongue every 5 (five) minutes as needed. For chest pain    FAMILY HISTORY:  His indicated that his mother is deceased. He indicated that his father is deceased. He indicated that the status of his neg hx is unknown.    SOCIAL HISTORY: He  reports that he has quit smoking. His smoking use included Cigarettes. He has a 23.00 pack-year smoking history. He has never used smokeless tobacco. He  reports that he does not drink alcohol or use drugs.  REVIEW OF SYSTEMS:   Unable to obtain as patient is intubated  SUBJECTIVE:    VITAL SIGNS: BP (!) 154/74 (BP Location: Right Arm)   Pulse 90   Temp 97 F (36.1 C)   Resp 20   Ht '5\' 9"'$  (1.753 m)   Wt 248 lb (112.5 kg)   SpO2 93%   BMI 36.62  kg/m   HEMODYNAMICS:    VENTILATOR SETTINGS: Vent Mode: PRVC FiO2 (%):  [40 %] 40 % Set Rate:  [18 bmp] 18 bmp Vt Set:  [550 mL] 550 mL PEEP:  [5 cmH20] 5 cmH20 Plateau Pressure:  [22 cmH20] 22 cmH20  INTAKE / OUTPUT: No intake/output data recorded.  PHYSICAL EXAMINATION: General:  Sedated, obese, No distress Neuro:  PERRL, No focal deficits HEENT:  Moist mucus membranes, No thryomegaly, JVD Cardiovascular:  RRR, no MRG Lungs:  Clear, no wheeze, crackles. Abdomen:  Obese, Soft, + BS Musculoskeletal:  Normal tone and bulk Skin:  Intact  LABS:  BMET  Recent Labs Lab 05/21/16 1349  NA 141  K 4.1  CL 106  CO2 28  BUN 15  CREATININE 1.10  GLUCOSE 162*    Electrolytes  Recent Labs Lab 05/21/16 1349  CALCIUM 10.2    CBC  Recent Labs Lab 05/21/16 1349  WBC 6.1  HGB 14.8  HCT 46.4  PLT 138*    Coag's  Recent Labs Lab 05/21/16 1349 05/26/16 0920  INR 2.99 1.26    Sepsis Markers No results for input(s): LATICACIDVEN, PROCALCITON, O2SATVEN in the last 168 hours.  ABG No results for input(s): PHART, PCO2ART, PO2ART in the last 168 hours.  Liver Enzymes No results for input(s): AST, ALT, ALKPHOS, BILITOT, ALBUMIN in the last 168 hours.  Cardiac Enzymes No results for input(s): TROPONINI, PROBNP in the last 168 hours.  Glucose  Recent Labs Lab 05/21/16 1311 05/26/16 0855 05/26/16 1503  GLUCAP 266* 178* 190*    Imaging CXR 12/26 > possible left hilar infiltrate, elevated left hemidiaphragm  STUDIES:    CULTURES:   ANTIBIOTICS:   SIGNIFICANT EVENTS:   LINES/TUBES: ETT 12/26 >  DISCUSSION: 75 year old with multiple medical issues including COPD, severe OSA, coronary artery disease. Underwent rectal polyp resection. Transferred to the ICU on vent  ASSESSMENT / PLAN:  PULMONARY A: Vent dependence post op H/O severe OSA COPD P:   Keep on vent. Check ABG. Follow CXR Weaning trial in AM  CARDIOVASCULAR A:  H/O  CAD, s/p CABG P:  Tele monitoring Hold HTN meds Hydralazine PRN for high BP  RENAL A:   Stable P:   Monitor urine output and Cr  GASTROINTESTINAL A:   Stable P:   Keep NPO TFs in AM if unable to extubate Pepcid for SUP  HEMATOLOGIC A:   No issues P:  Follow CBC  INFECTIOUS A:   No evidence of infection P:   Monitor of abx Check pct  ENDOCRINE A:   DM P:   SSI coverage  NEUROLOGIC A:   Sedation while intubated P:   RASS goal: 0. Precedex gtt Fentanyl, versed PRN   FAMILY  - Updates:  - Inter-disciplinary family meet or Palliative Care meeting due by:  06/02/10  Critical care- 35 mins.  Marshell Garfinkel MD [facility] Pager [phone] If no answer or after 3pm call: [phone] 05/26/2016, 4:24 PM

## 2016-05-26 NOTE — Op Note (Signed)
05/26/2016  2:21 PM  PATIENT:  Matthew Edwards  75 y.o. male  Patient Care Team: Donnamae Jude, MD as PCP - General (Obstetrics and Gynecology) Gaye Pollack, MD (Cardiothoracic Surgery) Peter M Martinique, MD as Attending Physician (Cardiology) Blanchie Serve, MD as Consulting Physician (Internal Medicine) Doran Stabler, MD as Consulting Physician (Gastroenterology) [name], MD as Consulting Physician (General Surgery)  PRE-OPERATIVE DIAGNOSIS:  RECTAL POLYP WITH HIGH GRADE DYSPLASIA  POST-OPERATIVE DIAGNOSIS:  RECTAL POLYP WITH HIGH GRADE DYSPLASIA  PROCEDURE:   TEM PARTIAL PROCTECTOMY OF RECTAL MASS EXAM UNDER ANESTHESIA  SURGEON:  Adin Hector, MD  ASSISTANT: RN   ANESTHESIA:   local and general  EBL:  Total I/O In: -  Out: 450 [Urine:150; Blood:300]  Delay start of Pharmacological VTE agent (>24hrs) due to surgical blood loss or risk of bleeding:  no  DRAINS: none   SPECIMEN:  Source of Specimen:  rectal polyp (see OR findings)  DISPOSITION OF SPECIMEN:  PATHOLOGY  COUNTS:  YES  PLAN OF CARE: Admit for overnight observation  PATIENT DISPOSITION:  PACU - hemodynamically stable.  INDICATION: Patient awake colonoscopy for chronic constipation.  A few polyps removed.  More bulky polyp found in the distal rectum with some high-grade dysplasia noted.  Surgical consultation requested.  I offered to him partial proctectomy to get full-thickness resection margins.  The anatomy & physiology of the digestive tract was discussed.  The pathophysiology of the rectal pathology was discussed.  Natural history risks without surgery was discussed.   I feel the risks of no intervention will lead to serious problems that outweigh the operative risks; therefore, I recommended surgery.    Laparoscopic & open abdominal techniques were discussed.  I recommended we start with a partial proctectomy by transanal endoscopic microsurgery (TEM) for excisional biopsy to remove the  pathology and hopefully cure and/or control the pathology.  This technique can offer less operative risk and faster post-operative recovery.  Possible need for immediate or later abdominal surgery for further treatment was discussed.   Risks such as bleeding, abscess, reoperation, ostomy, heart attack, death, and other risks were discussed.   I noted a good likelihood this will help address the problem.  Goals of post-operative recovery were discussed as well.  We will work to minimize complications.  An educational handout was given as well.  Questions were answered.  The patient expresses understanding & wishes to proceed with surgery.  OR FINDINGS: Very friable pedunculated left lateral distal rectal polyp 1-3 centimeters from the anal verge.  Involving about a quarter of the circumference.  The resulting mass was 5x4 cm in size  Pin placement on pathology specimen: Proximal margin: pink Distal margin: blue Left Anterior lateral margin: purple Left Posterior lateral margin: yellow  The closure rests 0cm from the anal verge in the left half circumference (1 o'clock to 6 o'clock)  DESCRIPTION: Informed consent was confirmed.  Patient received general anesthesia without difficulty.  Foley catheter sterilely placed.  Sequential compression devices active during the entire case.  The patient was placed in the left side down decubitus position, taking extra care to secure and protect the patient appropriately.  The perineum and perianal regions were prepped and draped in a sterile fashion.  Surgical timeout confirmed our plan.  I did a gentle digital rectal examination with gradual anal dilation to allow placement of the 4 cm TEO Stortz scope.  This was secured to the bed using the TEO clamping system.  We  induced carbon dioxide insufflation intraluminally.  I oriented the Stortz scope and the patient such that the specimen rested towards the floor at the 6:00 position.  I could easily identify the  mass.   His prep was poor so I had to disimpact a lot of volume of stool out of the rectum.  I went ahead and used tip point cautery to mark 1 cm margins circumferentially.  I then did a full thickness transection at the distal margin which involved the anal verge and anal canal..  I came around laterally.  Switched over to harmonic dissection.  Lifted the specimen off the pelvic canal for a good deep margin.  I gradually came more proximally.  Transected at the proximal margin.   Resulting wound about the size of a silver dollar involving the left lateral distal rectal wall and anal canal.  Sphincter complex intact and unharmed.  I ensured hemostasis.  I inspected the main specimen and pinned it on thick cork board.  There have been breakdown centrally but I did have good margins around it circumferentially.  Pins as noted above.  I walked the specimen down to pathology and showed the [facility] pathology team for proper orientation.    I went back in and scrubbed in.  Hemostasis was made.  I reapproximated the wound with 2-0 Vicryl horizontal mattress stitch to bring the middle part of the rectum down to help close the middle of the wound.    I then closed it with more interrupted horizontal mattress sutures of 2-0 Vicryl starting at the anterior and posterior corners and meeting towards the center.  This brought things together well.  I did meticulous inspection with fine tip instruments to confirm good watertight closure   Hemostasis excellent.  The lumen was quite patent.  Carbon dioxide evacuated & instruments removed.  The patient is being extubated go to recovery room.   I am about to discuss operative findings with the patient's family.  Adin Hector, M.D., F.A.C.S. Gastrointestinal and Minimally Invasive Surgery [facility] [address], [place], [place] [address] [phone] Main / Paging

## 2016-05-26 NOTE — Interval H&P Note (Signed)
History and Physical Interval Note:  05/26/2016 11:07 AM  Matthew Edwards  has presented today for surgery, with the diagnosis of RECTAL  POLYP WITH HIGH GRADE DYSPLASIA  The various methods of treatment have been discussed with the patient and family. After consideration of risks, benefits and other options for treatment, the patient has consented to  Procedure(s): TEM PARTIAL PROCTECTOMY OF RECTAL MASS (N/A) EXAM UNDER ANESTHESIA (N/A) as a surgical intervention .  The patient's history has been reviewed, patient examined, no change in status, stable for surgery.  I have reviewed the patient's chart and labs.  Questions were answered to the patient's satisfaction.     Rafel Garde C.

## 2016-05-26 NOTE — Anesthesia Procedure Notes (Signed)
Procedure Name: Intubation Date/Time: 05/26/2016 12:07 PM Performed by: Carleene Cooper A Pre-anesthesia Checklist: Patient identified, Timeout performed, Emergency Drugs available, Suction available and Patient being monitored Patient Re-evaluated:Patient Re-evaluated prior to inductionOxygen Delivery Method: Circle system utilized Preoxygenation: Pre-oxygenation with 100% oxygen Intubation Type: IV induction Ventilation: Mask ventilation without difficulty Laryngoscope Size: Mac and 4 Grade View: Grade I Tube type: Oral Tube size: 7.5 mm Number of attempts: 1 Airway Equipment and Method: Stylet Placement Confirmation: ETT inserted through vocal cords under direct vision,  positive ETCO2 and breath sounds checked- equal and bilateral Secured at: 23 cm Tube secured with: Tape Dental Injury: Teeth and Oropharynx as per pre-operative assessment

## 2016-05-26 NOTE — Transfer of Care (Signed)
Immediate Anesthesia Transfer of Care Note  Patient: Matthew Edwards  Procedure(s) Performed: Procedure(s): TEM PARTIAL PROCTECTOMY OF RECTAL MASS (N/A) EXAM UNDER ANESTHESIA (N/A)  Patient Location: PACU  Anesthesia Type:General  Level of Consciousness: awake, patient cooperative, responds to stimulation and Patient remains intubated per anesthesia plan  Airway & Oxygen Therapy: Patient remains intubated per anesthesia plan  Post-op Assessment: Report given to RN, Post -op Vital signs reviewed and stable and Patient moving all extremities X 4  Post vital signs: Reviewed and stable  Last Vitals:  Vitals:   05/26/16 0910  BP: (!) 169/87  Pulse: 75  Resp: 20  Temp: 36.6 C    Last Pain:  Vitals:   05/26/16 0910  TempSrc: Oral      Patients Stated Pain Goal: 4 (58/85/02 7741)  Complications: No apparent anesthesia complications

## 2016-05-26 NOTE — Anesthesia Preprocedure Evaluation (Addendum)
Anesthesia Evaluation  Patient identified by MRN, date of birth, ID band Patient awake    Reviewed: Allergy & Precautions, H&P , NPO status , Patient's Chart, lab work & pertinent test results  Airway Mallampati: II   Neck ROM: full    Dental   Pulmonary sleep apnea , COPD, former smoker,    breath sounds clear to auscultation       Cardiovascular hypertension, + CAD, + CABG and +CHF  + Valvular Problems/Murmurs  Rhythm:regular Rate:Normal  S/p AVR and CABG 2012.   Neuro/Psych Depression    GI/Hepatic GERD  ,  Endo/Other  diabetes, Type 2  Renal/GU      Musculoskeletal  (+) Arthritis ,   Abdominal   Peds  Hematology   Anesthesia Other Findings   Reproductive/Obstetrics                             Anesthesia Physical Anesthesia Plan  ASA: III  Anesthesia Plan: General   Post-op Pain Management:    Induction: Intravenous  Airway Management Planned: Oral ETT  Additional Equipment:   Intra-op Plan:   Post-operative Plan: Extubation in OR  Informed Consent: I have reviewed the patients History and Physical, chart, labs and discussed the procedure including the risks, benefits and alternatives for the proposed anesthesia with the patient or authorized representative who has indicated his/her understanding and acceptance.     Plan Discussed with: CRNA, Anesthesiologist and Surgeon  Anesthesia Plan Comments:         Anesthesia Quick Evaluation

## 2016-05-26 NOTE — Anesthesia Postprocedure Evaluation (Signed)
Anesthesia Post Note  Patient: Matthew Edwards  Procedure(Edwards) Performed: Procedure(Edwards) (LRB): TEM PARTIAL PROCTECTOMY OF RECTAL MASS (N/A) EXAM UNDER ANESTHESIA (N/A)  Patient location during evaluation: PACU Anesthesia Type: General Level of consciousness: sedated and patient remains intubated per anesthesia plan (Pt did not meed extubation criteria at end of surgery) Pain management: pain level controlled Vital Signs Assessment: post-procedure vital signs reviewed and stable Respiratory status: patient remains intubated per anesthesia plan and patient on ventilator - see flowsheet for VS (Poor minute ventilation when breathing spontaneously.  ETCO2 reached 90s.) Cardiovascular status: stable Anesthetic complications: no Comments: Pt remained intubated at the end of surgery and throughout PACU stay.  When allowed to breath spontaneously, ETCO2 would rise to 90s.  Vent support was necessary.  CXR done. Critical Care consulted and at bedside.  Transfer to ICU.       Last Vitals:  Vitals:   05/26/16 1515 05/26/16 1615  BP: (!) 154/74   Pulse: 90   Resp:    Temp:  36.1 C    Last Pain:  Vitals:   05/26/16 0910  TempSrc: Oral                 Matthew Edwards

## 2016-05-26 NOTE — H&P (Signed)
Matthew Edwards 04/20/2016 3:39 PM Location: [facility] Patient #: [id number] DOB: 07/22/1940 Widowed / Language: Matthew Edwards / Race: Black or African American Male  Patient Care Team: Matthew Jude, MD as PCP - General (Obstetrics and Gynecology) Matthew Pollack, MD (Cardiothoracic Surgery) Matthew M Martinique, MD as Attending Physician (Cardiology) Matthew Serve, MD as Consulting Physician (Internal Medicine) Matthew Stabler, MD as Consulting Physician (Gastroenterology) Matthew Boston, MD as Consulting Physician (General Surgery)   History of Present Illness  The patient is a 75 year old male who presents with a colorectal polyp. Note for "Colorectal polyp": Patient sent for surgical consultation at the request of Dr. Loletha Carrow with [facility] gastroenterology. Concern for distal rectal polyp with high-grade dysplasia.  Pleasant elderly gentleman. Comes today with his daughter. He has issues with some memory loss. Relies on her for help. I believe they have healthcare power of attorney. Patient underwent screening colonoscopy for constipation and 10 year follow-up. Found to have some polyps removed. There was more distal atypical polyp closer to the anal verge. Biopsies showed adenomatous polyp with high-grade dysplasia. Surgical consultation requested. Patient's daughter thought maybe he had been a hemorrhoid but biopsies were concerning. Patient struggles with constipation. FiberCon doesn't seem to help soften needs MiraLAX. That usually doesn't track. He does have some chronic cardiac issues and is anticoagulated on warfarin. Diabetic. Glucoses usually 120s. He thinks he had hernia surgery one point but otherwise no major surgery. Sees [name] for his acute on chronic heart failure and atrial fibrillation. Some occasional blood when he wipes but no major rectal bleeding.  No personal nor family history of GI/colon cancer, inflammatory bowel disease, irritable bowel  syndrome, allergy such as Celiac Sprue, dietary/dairy problems, colitis, ulcers nor gastritis. No recent sick contacts/gastroenteritis. No travel outside the country. No changes in diet. No dysphagia to solids or liquids. No significant heartburn or reflux. No hematochezia, hematemesis, coffee ground emesis. No evidence of prior gastric/peptic ulceration. The patient thinks that he's had polyps in his family. No colorectal cancer.  Cleared by cardiology.  Ready for surgery.  Anticogulation held.   Other Problems Matthew Edwards, [place]; 04/20/2016 3:39 PM) Diabetes Mellitus  Hemorrhoids  High blood pressure   Past Surgical History Matthew Edwards, [place]; 04/20/2016 3:39 PM) Coronary Artery Bypass Graft  Valve Replacement   Diagnostic Studies History Matthew Edwards, CMA; 04/20/2016 3:39 PM) Colonoscopy  within last year  Allergies (Matthew Edwards, Matthew Edwards; 04/20/2016 3:41 PM) No Known Drug Allergies 04/20/2016  Medication History (Matthew Edwards, CMA; 04/20/2016 3:43 PM) Atorvastatin Calcium (40MG  Tablet, Oral) Active. Benazepril HCl (20MG  Tablet, Oral) Active. Potassium Chloride Crys ER (20MEQ Tablet ER, Oral) Active. Warfarin Sodium (5MG  Tablet, Oral) Active. MiraLax (Oral) Active. Symbicort (160-4.5MCG/ACT Aerosol, Inhalation) Active. GlipiZIDE (10MG  Tablet, Oral) Active. Medications Reconciled  Social History (Matthew Edwards; 04/20/2016 3:39 PM) Alcohol use  Occasional alcohol use. Caffeine use  Coffee. No drug use   Family History (Matthew Edwards; 04/20/2016 3:39 PM) Colon Polyps  Mother. Diabetes Mellitus  Mother. Hypertension  Mother.    Review of Systems (Matthew Edwards; 04/20/2016 3:40 PM) General Present- Weight Gain. Not Present- Appetite Loss, Chills, Fatigue, Fever, Night Sweats and Weight Loss. Skin Present- Dryness. Not Present- Change in Wart/Mole, Hives, Jaundice, New Lesions, Non-Healing Wounds, Rash and Ulcer. HEENT Not Present- Earache,  Hearing Loss, Hoarseness, Nose Bleed, Oral Ulcers, Ringing in the Ears, Seasonal Allergies, Sinus Pain, Sore Throat, Visual Disturbances, Wears glasses/contact lenses and Yellow Eyes. Respiratory Present- Chronic Cough,  Snoring and Wheezing. Not Present- Bloody sputum and Difficulty Breathing. Breast Not Present- Breast Mass, Breast Pain, Nipple Discharge and Skin Changes. Gastrointestinal Present- Bloating, Constipation, Gets full quickly at meals and Hemorrhoids. Not Present- Abdominal Pain, Bloody Stool, Change in Bowel Habits, Chronic diarrhea, Difficulty Swallowing, Excessive gas, Indigestion, Nausea, Rectal Pain and Vomiting. Male Genitourinary Not Present- Blood in Urine, Change in Urinary Stream, Frequency, Impotence, Nocturia, Painful Urination, Urgency and Urine Leakage.  Vitals (Matthew Edwards CMA; 04/20/2016 3:40 PM) 04/20/2016 3:40 PM Weight: 254 lb Height: 69in Body Surface Area: 2.29 m Body Mass Index: 37.51 kg/m  Pulse: 79 (Regular)  BP: 126/82 (Sitting, Left Arm, Standard)    BP (!) 169/87   Pulse 75   Temp 97.8 F (36.6 C) (Oral)   Resp 20   Ht 5\' 9"  (1.753 m)   Wt 112.5 kg (248 lb)   SpO2 98%   BMI 36.62 kg/m     Physical Exam Matthew Hector MD; 04/20/2016 4:02 PM) General Mental Status-Alert. General Appearance-Not in acute distress, Not Sickly. Orientation-Oriented X3. Hydration-Well hydrated. Voice-Normal.  Integumentary Global Assessment Upon inspection and palpation of skin surfaces of the - Axillae: non-tender, no inflammation or ulceration, no drainage. and Distribution of scalp and body hair is normal. General Characteristics Temperature - normal warmth is noted.  Head and Neck Head-normocephalic, atraumatic with no lesions or palpable masses. Face Global Assessment - atraumatic, no absence of expression. Neck Global Assessment - no abnormal movements, no bruit auscultated on the right, no bruit auscultated on the left,  no decreased range of motion, non-tender. Trachea-midline. Thyroid Gland Characteristics - non-tender.  Eye Eyeball - Left-Extraocular movements intact, No Nystagmus. Eyeball - Right-Extraocular movements intact, No Nystagmus. Cornea - Left-No Hazy. Cornea - Right-No Hazy. Sclera/Conjunctiva - Left-No scleral icterus, No Discharge. Sclera/Conjunctiva - Right-No scleral icterus, No Discharge. Pupil - Left-Direct reaction to light normal. Pupil - Right-Direct reaction to light normal.  ENMT Ears Pinna - Left - no drainage observed, no generalized tenderness observed. Right - no drainage observed, no generalized tenderness observed. Nose and Sinuses External Inspection of the Nose - no destructive lesion observed. Inspection of the nares - Left - quiet respiration. Right - quiet respiration. Mouth and Throat Lips - Upper Lip - no fissures observed, no pallor noted. Lower Lip - no fissures observed, no pallor noted. Nasopharynx - no discharge present. Oral Cavity/Oropharynx - Tongue - no dryness observed. Oral Mucosa - no cyanosis observed. Hypopharynx - no evidence of airway distress observed.  Chest and Lung Exam Inspection Movements - Normal and Symmetrical. Accessory muscles - No use of accessory muscles in breathing. Palpation Palpation of the chest reveals - Non-tender. Auscultation Breath sounds - Normal and Clear.  Cardiovascular Auscultation Rhythm - Regular. Murmurs & Other Heart Sounds - Auscultation of the heart reveals - No Murmurs and No Systolic Clicks.  Abdomen Inspection Inspection of the abdomen reveals - No Visible peristalsis and No Abnormal pulsations. Umbilicus - No Bleeding, No Urine drainage. Palpation/Percussion Palpation and Percussion of the abdomen reveal - Soft, Non Tender, No Rebound tenderness, No Rigidity (guarding) and No Cutaneous hyperesthesia. Note: Morbidly obese but soft. Nontender, nondistended. No guarding. No umbilical no  other hernias   Male Genitourinary Sexual Maturity Tanner 5 - Adult hair pattern and Adult penile size and shape. Note: No inguinal hernias. Normal external genitalia. Epididymi, testes, and spermatic cords normal without any masses.   Rectal Note: Pedunculated polyp about 2 cm from the anal verge. Left lateral wall. Friable.  Exam done with assistance of male Medical Assistant in the room. Perianal skin clean with good hygiene. No pruritis ani. No pilonidal disease. No fissure. No abscess/fistula. Normal sphincter tone. Tolerates digital and anoscopic rectal exam. No other rectal masses to 5cm. No external hemorrhoids.   Peripheral Vascular Upper Extremity Inspection - Left - No Cyanotic nailbeds, Not Ischemic. Right - No Cyanotic nailbeds, Not Ischemic.  Neurologic Neurologic evaluation reveals -normal attention span and ability to concentrate, able to name objects and repeat phrases. Appropriate fund of knowledge , normal sensation and normal coordination. Mental Status Affect - not angry, not paranoid. Cranial Nerves-Normal Bilaterally. Gait-Normal. Note: Poor recall.   Neuropsychiatric Mental status exam performed with findings of-able to articulate well with normal speech/language, rate, volume and coherence, thought content normal with ability to perform basic computations and apply abstract reasoning and no evidence of hallucinations, delusions, obsessions or homicidal/suicidal ideation.  Musculoskeletal Global Assessment Spine, Ribs and Pelvis - no instability, subluxation or laxity. Right Upper Extremity - no instability, subluxation or laxity.  Lymphatic Head & Neck  General Head & Neck Lymphatics: Bilateral - Description - No Localized lymphadenopathy. Axillary  General Axillary Region: Bilateral - Description - No Localized lymphadenopathy. Femoral & Inguinal  Generalized Femoral & Inguinal Lymphatics: Left - Description - No Localized  lymphadenopathy. Right - Description - No Localized lymphadenopathy.    Assessment & Plan   ADENOMATOUS RECTAL POLYP (D12.8) Impression: Left lateral distal rectal polyp with high-grade dysplasia. Standard of care would be removal.  Would plan TEM resection. Left side down decubitus positioning. An outpatient surgery. This would be like a super hemorrhoidectomy to him. May need to stay overnight if there are cardiopulmonary concerns  CArdiac clearance done.  Off anticoagulation.    Current Plans Pt Education - CCS TEM Education (Matthew Edwards): discussed with patient and provided information. PREOP COLON - ENCOUNTER FOR PREOPERATIVE EXAMINATION FOR GENERAL SURGICAL PROCEDURE (Z01.818) Current Plans You are being scheduled for surgery - Our schedulers will call you.  You should hear from our office's scheduling department within 5 working days about the location, date, and time of surgery. We try to make accommodations for patient's preferences in scheduling surgery, but sometimes the OR schedule or the surgeon's schedule prevents [place] from making those accommodations.  If you have not heard from our office [phone]) in 5 working days, call the office and ask for your surgeon's nurse.  If you have other questions about your diagnosis, plan, or surgery, call the office and ask for your surgeon's nurse.  Written instructions provided Pt Education - CCS Colon Bowel Prep 2015 Miralax/Antibiotics Started Neomycin Sulfate 500MG , 2 (two) Tablet SEE NOTE, #6, 04/20/2016, No Refill. Local Order: TAKE TWO TABLETS AT 2 PM, 3 PM, AND 10 PM THE DAY PRIOR TO SURGERY Started Flagyl 500MG , 2 (two) Tablet SEE NOTE, #6, 04/20/2016, No Refill. Local Order: Take at 2pm, 3pm, and 10pm the day prior to your colon operation Pt Education - CCS Colectomy post-op instructions: discussed with patient and provided information. Pt Education - CCS Good Bowel Health (Matthew Edwards) Pt Education - CCS Pain Control (Matthew Edwards) Pt  Education - Pamphlet Given - Laparoscopic Colorectal Surgery: discussed with patient and provided information. CHRONIC CONSTIPATION (K59.09) Impression: I recommend he increase his fiber a switched MiraLAX. Adjust dose until moving his bowels once a day. Current Plans Pt Education - CCS Constipation (AT) Pt Education - CCS Good Bowel Health (Matthew Edwards) ANTICOAGULATED (Z79.01) Impression: Would like to make sure it safe to come off anticoagulation. He  takes warfarin. Current Plans I recommended obtaining preoperative cardiac clearance. I am concerned about the health of the patient and the ability to tolerate the operation. Therefore, we will request clearance by cardiology to better assess operative risk & see if a reevaluation, further workup, etc is needed. Also recommendations on how medications such as for anticoagulation and blood pressure should be managed/held/restarted after surgery. Advised patient to stop ASA, anticoagulant, blood thinners, and NSAIDs Three (3) days prior to surgery.  I have re-reviewed the the patient's records, history, medications, and allergies.  I have re-examined the patient.  I again discussed intraoperative plans and goals of post-operative recovery.  The patient agrees to proceed.  Matthew Edwards CHIZEK  1941-05-18 [id number]  Patient Care Team: Matthew Jude, MD as PCP - General (Obstetrics and Gynecology) Matthew Pollack, MD (Cardiothoracic Surgery) Matthew M Martinique, MD as Attending Physician (Cardiology) Matthew Serve, MD as Consulting Physician (Internal Medicine) Matthew Stabler, MD as Consulting Physician (Gastroenterology) Matthew Boston, MD as Consulting Physician (General Surgery)  Patient Active Problem List   Diagnosis Date Noted  . Spinal stenosis of lumbar region 11/28/2015  . Abnormality of gait   . Atrial fibrillation ([place])   . Lower extremity weakness   . Type 2 diabetes mellitus with complication, without long-term current use of insulin ([place])    . Weakness 09/18/2015  . Constipation 01/28/2015  . OSA (obstructive sleep apnea) 11/13/2014  . Pulmonary HTN 08/24/2014  . Chronic anticoagulation   . Chronic atrial fibrillation ([place])   . Acute on chronic congestive heart failure with left ventricular diastolic dysfunction ([place]) 08/02/2014  . Cognitive impairment 07/02/2014  . Altered mental status 06/28/2014  . Abdominal distention 06/28/2014  . Unspecified constipation 12/13/2013  . S/P AVR 12/29/2012  . Diabetic neuropathy ([place]) 11/25/2011  . Excessive tearing 10/06/2011  . BENIGN POSITIONAL VERTIGO 06/14/2010  . Dizziness 06/14/2010  . Vitamin D deficiency 02/21/2010  . Primary hyperparathyroidism ([place]) 06/04/2009  . Hypercalcemia 05/29/2009  . Essential hypertension 02/23/2007  . DEPRESSION 12/15/2006  . Coronary atherosclerosis 12/15/2006  . GERD 12/15/2006  . COPD (chronic obstructive pulmonary disease) ([place]) 12/10/2006  . HLD (hyperlipidemia) 07/29/2006  . Alcohol abuse 07/29/2006  . Aortic valve disorder 07/29/2006    Past Medical History:  Diagnosis Date  . Arthritis   . Atrial fibrillation ([place])   . CAD (coronary artery disease) 2012   [place]-LAD  . COPD (chronic obstructive pulmonary disease) ([place])   . GERD (gastroesophageal reflux disease)   . HEART FAILURE, CONGESTIVE UNSPEC 02/23/2007   Qualifier: Diagnosis of  By: Mellody Drown MD, [facility]    . Hyperlipidemia   . Hypertension   . OSA (obstructive sleep apnea) 11/13/2014  . S/P AVR    #25 mm Edwards pericardial valve Bioprosthetic. Dr Cyndia Bent.  . Situational depression    "son passed 11/2013"  . Sleep apnea    occ uses c pap does not know settings   . Stented coronary artery 2013  . TOBACCO DEPENDENCE 07/29/2006   Qualifier: History of  By: Carlena Sax  MD, [facility] [place]    . Type II diabetes mellitus ([place])     Past Surgical History:  Procedure Laterality Date  . AORTIC VALVE REPLACEMENT  07/2010   notes 07/28/2010  . CARDIAC CATHETERIZATION  06/2010   Archie Endo  07/01/2010  . CORONARY ANGIOPLASTY WITH STENT PLACEMENT  2006; 10/2006   "2"; 1/notes 10/01/2010  . CORONARY ARTERY BYPASS GRAFT  07/2010   "1"/notes 07/28/2010    Social History  Social History  . Marital status: Legally Separated    Spouse name: N/A  . Number of children: 7  . Years of education: 71   Occupational History  . Retired-[employer] Retired  . [employer]    Social History Main Topics  . Smoking status: Former Smoker    Packs/day: 0.50    Years: 46.00    Types: Cigarettes  . Smokeless tobacco: Never Used  . Alcohol use No  . Drug use: No  . Sexual activity: Not on file   Other Topics Concern  . Not on file   Social History Narrative   Health Care POA:    Emergency Contact: Daughter, [name]   End of Life Plan:    Who lives with you: self   Any pets: none   Diet: Patient has a varied diet of protein, starch, and vegetables.   Exercise: Pt has not regular exercise routine.   Seatbelts: Pt reports wearing seatbelt when in vehile.   Nancy Fetter Exposure/Protection:    Hobbies: fishing, tv          Family History  Problem Relation Age of Onset  . Heart disease Mother   . Colon cancer Neg Hx   . Esophageal cancer Neg Hx   . Stomach cancer Neg Hx   . Pancreatic cancer Neg Hx   . Liver disease Neg Hx   . Colon polyps Neg Hx     Current Facility-Administered Medications  Medication Dose Route Frequency Provider Last Rate Last Dose  . bupivacaine liposome (EXPAREL) 1.3 % injection 266 mg  20 mL Infiltration On Call to OR Matthew Boston, MD      . cefoTEtan (CEFOTAN) 2 g in dextrose 5 % 50 mL IVPB  2 g Intravenous On Call to OR Matthew Boston, MD       Facility-Administered Medications Ordered in Other Encounters  Medication Dose Route Frequency Provider Last Rate Last Dose  . lactated ringers infusion    Continuous PRN Dione Booze, CRNA         Allergies  Allergen Reactions  . Pineapple Concentrate Nausea And Vomiting    BP (!) 169/87   Pulse 75   Temp  97.8 F (36.6 C) (Oral)   Resp 20   Ht 5\' 9"  (1.753 m)   Wt 112.5 kg (248 lb)   SpO2 98%   BMI 36.62 kg/m   Labs: Results for orders placed or performed during the hospital encounter of 05/26/16 (from the past 48 hour(s))  Glucose, capillary     Status: Abnormal   Collection Time: 05/26/16  8:55 AM  Result Value Ref Range   Glucose-Capillary 178 (H) 65 - 99 mg/dL   Comment 1 Notify RN   Protime-INR     Status: Abnormal   Collection Time: 05/26/16  9:20 AM  Result Value Ref Range   Prothrombin Time 15.8 (H) 11.4 - 15.2 seconds   INR 1.26     Imaging / Studies: No results found.   Matthew Edwards, M.D., F.A.C.S. Gastrointestinal and Minimally Invasive Surgery [facility] [address] [place], [place] [address] [phone] Main / Paging  05/26/2016 11:05 AM

## 2016-05-27 LAB — GLUCOSE, CAPILLARY
Glucose-Capillary: 239 mg/dL — ABNORMAL HIGH (ref 65–99)
Glucose-Capillary: 214 mg/dL — ABNORMAL HIGH (ref 65–99)
Glucose-Capillary: 98 mg/dL (ref 65–99)
Glucose-Capillary: 94 mg/dL (ref 65–99)
Glucose-Capillary: 114 mg/dL — ABNORMAL HIGH (ref 65–99)
Glucose-Capillary: 124 mg/dL — ABNORMAL HIGH (ref 65–99)

## 2016-05-27 LAB — BASIC METABOLIC PANEL
Anion gap: 8 (ref 5–15)
BUN: 14 mg/dL (ref 6–20)
CO2: 25 mmol/L (ref 22–32)
Calcium: 9.4 mg/dL (ref 8.9–10.3)
Chloride: 102 mmol/L (ref 101–111)
Creatinine, Ser: 1.12 mg/dL (ref 0.61–1.24)
GFR calc Af Amer: >60 mL/min (ref >60)
GFR calc non Af Amer: >60 mL/min (ref >60)
Glucose, Bld: 230 mg/dL — ABNORMAL HIGH (ref 65–99)
Potassium: 4.6 mmol/L (ref 3.5–5.1)
Sodium: 135 mmol/L (ref 135–145)

## 2016-05-27 LAB — PROCALCITONIN: Procalcitonin: 0.96 ng/mL

## 2016-05-27 MED ORDER — POLYETHYLENE GLYCOL 3350 17 G PO PACK
34.0000 g | PACK | Freq: Two times a day (BID) | ORAL | Status: DC
Start: 2016-05-27 — End: 2016-05-29
  Administered 2016-05-28 – 2016-05-29 (×2): 34 g via ORAL
  Filled 2016-05-27 (×3): qty 2

## 2016-05-27 MED ORDER — METOPROLOL TARTRATE 12.5 MG HALF TABLET: 12.5000 mg | ORAL_TABLET | Freq: Two times a day (BID) | ORAL | Status: DC | PRN

## 2016-05-27 MED ORDER — METOPROLOL TARTRATE 5 MG/5ML IV SOLN: 5.0000 mg | Freq: Four times a day (QID) | INTRAVENOUS | Status: DC | PRN

## 2016-05-27 NOTE — Procedures (Signed)
Extubation Procedure Note  Patient Details:   Name: QUINCEY QUESINBERRY DOB: Aug 26, 1940 MRN: [id number]   Airway Documentation:     Evaluation  O2 sats: 95 Complications: none Patient tolerated procedure well. Bilateral Breath Sounds: Clear, Diminished   Pt able to speak  Per CCM order, pt extubated and placed on nasal cannula.  Pt tolerated well, no complications.  Martha Clan 05/27/2016, 9:02 AM

## 2016-05-27 NOTE — Progress Notes (Signed)
PULMONARY / CRITICAL CARE MEDICINE   Name: Matthew Edwards MRN: [id number] DOB: 1940/07/16    ADMISSION DATE:  05/26/2016 CONSULTATION DATE:  05/26/16  REFERRING MD:  [name] MD  CHIEF COMPLAINT:  Vent dependent respiratory failure, status post rectal polyp removal.  HISTORY OF PRESENT ILLNESS:   75 year old with history of aortic wall replacement, coronary artery disease status post CABG, A. Fib on coumadin, COPD, severe OSA, diabetes. He was admitted on 05/26/16 for resection of rectal polyp. Patient was slow to recover from anesthesia and intermittently agitated. Hence was not extubated on PACU. PCCM consulted for management of the vent.  PAST MEDICAL HISTORY :  He  has a past medical history of Arthritis; Atrial fibrillation ([place]); CAD (coronary artery disease) (2012); COPD (chronic obstructive pulmonary disease) ([place]); GERD (gastroesophageal reflux disease); HEART FAILURE, CONGESTIVE UNSPEC (02/23/2007); Hyperlipidemia; Hypertension; OSA (obstructive sleep apnea) (11/13/2014); S/P AVR; Situational depression; Sleep apnea; Stented coronary artery (2013); TOBACCO DEPENDENCE (07/29/2006); and Type II diabetes mellitus ([place]).  PAST SURGICAL HISTORY: He  has a past surgical history that includes Aortic valve replacement (07/2010); Coronary artery bypass graft (07/2010); Coronary angioplasty with stent (2006; 10/2006); Cardiac catheterization (06/2010); and Partial proctectomy by tem (N/A, 05/26/2016).  Allergies  Allergen Reactions  . Pineapple Concentrate Nausea And Vomiting    No current facility-administered medications on file prior to encounter.    Current Outpatient Prescriptions on File Prior to Encounter  Medication Sig  . albuterol (PROVENTIL HFA;VENTOLIN HFA) 108 (90 Base) MCG/ACT inhaler Inhale 2 puffs into the lungs every 6 (six) hours as needed for wheezing or shortness of breath.  Marland Kitchen atorvastatin (LIPITOR) 40 MG tablet Take 1 tablet (40 mg total) by mouth daily at 6 PM.  .  benazepril (LOTENSIN) 20 MG tablet Take 1 tablet (20 mg total) by mouth daily.  . budesonide-formoterol (SYMBICORT) 80-4.5 MCG/ACT inhaler Inhale 2 puffs into the lungs 2 (two) times daily.  . Cholecalciferol (VITAMIN D-3 PO) Take 1,000 Units by mouth daily.  . furosemide (LASIX) 40 MG tablet Take 1 tablet (40 mg total) by mouth 2 (two) times daily.  Marland Kitchen glipiZIDE (GLUCOTROL) 10 MG tablet Take 1 tablet by mouth at 5 PM with a meal.  . metoprolol succinate (TOPROL-XL) 25 MG 24 hr tablet Take 1 tablet (25 mg total) by mouth daily. Reported on 11/28/2015  . potassium chloride SA (K-DUR,KLOR-CON) 20 MEQ tablet Take 1 tablet (20 mEq total) by mouth daily.  . vitamin C (ASCORBIC ACID) 500 MG tablet Take 1 tablet (500 mg total) by mouth every morning.  . warfarin (COUMADIN) 5 MG tablet Take 1 tablet (5 mg total) by mouth daily. (Patient taking differently: Take 7.5-10 mg by mouth See admin instructions. 7.5 mg Sun and Fri.  10 mg Mon, Tues, Wed, Thurs and Sat)  . Blood Glucose Monitoring Suppl (ACCU-CHEK AVIVA PLUS) W/DEVICE KIT 1 Units by Does not apply route 3 (three) times daily.  Marland Kitchen glucose blood (ACCU-CHEK AVIVA PLUS) test strip USE TO CHECK BLOOD SUGARS FOUR TIMES DAILY  . nitroGLYCERIN (NITROSTAT) 0.4 MG SL tablet Place 1 tablet (0.4 mg total) under the tongue every 5 (five) minutes as needed. For chest pain    FAMILY HISTORY:  His indicated that his mother is deceased. He indicated that his father is deceased. He indicated that the status of his neg hx is unknown.    SOCIAL HISTORY: He  reports that he has quit smoking. His smoking use included Cigarettes. He has a 23.00 pack-year smoking history. He  has never used smokeless tobacco. He reports that he does not drink alcohol or use drugs.  REVIEW OF SYSTEMS:   Unable to obtain as patient is intubated  SUBJECTIVE:  Awake, agitated on SBT today. Pulling in good volumes  VITAL SIGNS: BP 111/80 (BP Location: Right Arm)   Pulse 65   Temp 97.5  F (36.4 C) (Oral)   Resp 15   Ht '5\' 9"'$  (1.753 m)   Wt 248 lb (112.5 kg)   SpO2 90%   BMI 36.62 kg/m   HEMODYNAMICS:    VENTILATOR SETTINGS: Vent Mode: PRVC FiO2 (%):  [40 %-50 %] 40 % Set Rate:  [18 bmp] 18 bmp Vt Set:  [550 mL-560 mL] 560 mL PEEP:  [5 cmH20] 5 cmH20 Plateau Pressure:  [10 cmH20-25 cmH20] 22 cmH20  INTAKE / OUTPUT: I/O last 3 completed shifts: In: 1201.4 [I.V.:1151.4; IV Piggyback:50] Out: 1685 [Urine:1385; Blood:300]  PHYSICAL EXAMINATION: General:  Awake, alert, obese. Neuro:  PERRL, No focal deficits HEENT:  Moist mucus membranes, No thryomegaly, JVD Cardiovascular:  RRR, no MRG Lungs:  Clear, no wheeze, crackles. Abdomen:  Obese, Soft, + BS Musculoskeletal:  Normal tone and bulk Skin:  Intact  LABS:  BMET  Recent Labs Lab 05/21/16 1349  NA 141  K 4.1  CL 106  CO2 28  BUN 15  CREATININE 1.10  GLUCOSE 162*    Electrolytes  Recent Labs Lab 05/21/16 1349  CALCIUM 10.2    CBC  Recent Labs Lab 05/21/16 1349  WBC 6.1  HGB 14.8  HCT 46.4  PLT 138*    Coag's  Recent Labs Lab 05/21/16 1349 05/26/16 0920  INR 2.99 1.26    Sepsis Markers  Recent Labs Lab 05/26/16 1823 05/27/16 0343  PROCALCITON <0.10 0.96    ABG  Recent Labs Lab 05/26/16 1710  PHART 7.441  PCO2ART 39.9  PO2ART 67.0*    Liver Enzymes No results for input(s): AST, ALT, ALKPHOS, BILITOT, ALBUMIN in the last 168 hours.  Cardiac Enzymes No results for input(s): TROPONINI, PROBNP in the last 168 hours.  Glucose  Recent Labs Lab 05/26/16 1503 05/26/16 1628 05/26/16 1951 05/26/16 2332 05/27/16 0406 05/27/16 0758  GLUCAP 190* 198* 188* 223* 239* 214*    Imaging CXR 12/26 > possible left hilar infiltrate, elevated left hemidiaphragm  STUDIES:    CULTURES:   ANTIBIOTICS:   SIGNIFICANT EVENTS:   LINES/TUBES: ETT 12/26 >  DISCUSSION: 75 year old with multiple medical issues including COPD, severe OSA, coronary artery  disease. Underwent rectal polyp resection. Transferred to the ICU on vent  ASSESSMENT / PLAN:  PULMONARY A: Vent dependence post op H/O severe OSA COPD P:   Extubate today Supplemental O2  CARDIOVASCULAR A:  H/O CAD, s/p CABG P:  Tele monitoring Hold HTN meds Hydralazine PRN for high BP  RENAL A:   Stable P:   Monitor urine output and Cr  GASTROINTESTINAL A:   Stable P:   Keep NPO TFs in AM if unable to extubate Pepcid for SUP  HEMATOLOGIC A:   No issues P:  Follow CBC  INFECTIOUS A:   No evidence of infection P:   Monitor of abx Check pct  ENDOCRINE A:   DM P:   SSI coverage  NEUROLOGIC A:   Sedation while intubated P:   RASS goal: 0. Precedex gtt Fentanyl, versed PRN  FAMILY  - Updates:  - Inter-disciplinary family meet or Palliative Care meeting due by:  06/02/10  Critical care- 35 mins.  Kately Graffam  MD [facility] Pager [phone] If no answer or after 3pm call: [phone] 05/27/2016, 8:35 AM

## 2016-05-27 NOTE — Progress Notes (Signed)
[facility]  [address] [place], [place] [address] Phone: [phone] FAX: [place] [phone] 10-01-1940    Problem List:   Principal Problem:   Adenomatous rectal polyp with high grade dysplasia s/p TEM resection 05/26/2016 Active Problems:   COPD (chronic obstructive pulmonary disease) (HCC)   Cognitive impairment   Acute on chronic congestive heart failure with left ventricular diastolic dysfunction (HCC)   Chronic atrial fibrillation (HCC)   OSA (obstructive sleep apnea)   Type 2 diabetes mellitus with complication, without long-term current use of insulin (HCC)   Rectal mass   1 Day Post-Op  05/26/2016  Procedure(s): TEM PARTIAL PROCTECTOMY OF RECTAL MASS EXAM UNDER ANESTHESIA   Assessment  Recovering but guarded  Plan:  -follow off vent / [place] - SDU status minimal today -follow MS - has baseline confusion.  No delirium/agitation -check lytes -improve glc control - resistant SSI -diuresis as tolerated -VTE prophylaxis- SCDs, etc -mobilize as tolerated to help recovery  Adin Hector, M.D., F.A.C.S. Gastrointestinal and Minimally Invasive Surgery [facility] [address], [place],  [address] [phone] Main / Paging   05/27/2016  CARE TEAM:  PCP: Donnamae Jude, MD  Outpatient Care Team: Patient Care Team: Donnamae Jude, MD as PCP - General (Obstetrics and Gynecology) Gaye Pollack, MD (Cardiothoracic Surgery) Peter M Martinique, MD as Attending Physician (Cardiology) Blanchie Serve, MD as Consulting Physician (Internal Medicine) Doran Stabler, MD as Consulting Physician (Gastroenterology) [name], MD as Consulting Physician (General Surgery)  Inpatient Treatment Team: Treatment Team: Attending Provider: [name], MD; Consulting Physician: Md Pccm, MD; Registered Nurse: Lynnell Dike, RN  Subjective:  Kept on vent  due to CO2 retention & weakness - extubated 2 hrs ago Confused but consolable RN just outside room HD stable Seen by PCCM  Objective:  Vital signs:  Vitals:   05/27/16 0700 05/27/16 0743 05/27/16 0800 05/27/16 0801  BP: (!) 149/88  111/80   Pulse: 75  63 65  Resp: 18  17 15   Temp:      TempSrc:      SpO2: 95% 92% 95% 90%  Weight:      Height:        Last BM Date: 05/26/16  Intake/Output   Yesterday:  12/26 0701 - 12/27 0700 In: 1201.4 [I.V.:1151.4; IV Piggyback:50] Out: 9381 [Urine:1385; Blood:300] This shift:  Total I/O In: 78.8 [I.V.:78.8] Out: -   Bowel function:  Flatus: YES  BM:  No  Drain: (No drain)   Physical Exam:  General: Pt awake/alert/oriented x4 in mild acute distress Eyes: PERRL, normal EOM.  Sclera clear.  No icterus Neuro: CN II-XII intact w/o focal sensory/motor deficits. Lymph: No head/neck/groin lymphadenopathy Psych:  No psychosis/paranoia.  Moderately confused - a little more than baseline.  No deirium HENT: Normocephalic, Mucus membranes moist.  No thrush Neck: Supple, No tracheal deviation Chest: No chest wall pain w good excursion CV:  Pulses intact.  Regular rhythm MS: Normal AROM mjr joints.  No obvious deformity Abdomen:  Morbidly obese &  Somewhat firm.  Mildy distended.  Nontender.  (That is his baseline).  No evidence of peritonitis.  No incarcerated hernias. GU: Foley in place Rectal: - no active bleeding Ext:  SCDs BLE.  No mjr edema.  No cyanosis Skin: No petechiae / purpura  Results:   Labs: Results for orders placed or performed during the hospital encounter of 05/26/16 (  from the past 48 hour(s))  Glucose, capillary     Status: Abnormal   Collection Time: 05/26/16  8:55 AM  Result Value Ref Range   Glucose-Capillary 178 (H) 65 - 99 mg/dL   Comment 1 Notify RN   Protime-INR     Status: Abnormal   Collection Time: 05/26/16  9:20 AM  Result Value Ref Range   Prothrombin Time 15.8 (H) 11.4 - 15.2 seconds   INR  1.26   Glucose, capillary     Status: Abnormal   Collection Time: 05/26/16  3:03 PM  Result Value Ref Range   Glucose-Capillary 190 (H) 65 - 99 mg/dL  Glucose, capillary     Status: Abnormal   Collection Time: 05/26/16  4:28 PM  Result Value Ref Range   Glucose-Capillary 198 (H) 65 - 99 mg/dL  Blood gas, arterial     Status: Abnormal   Collection Time: 05/26/16  5:10 PM  Result Value Ref Range   FIO2 40.00    Delivery systems VENTILATOR    Mode PRESSURE REGULATED VOLUME CONTROL    VT 550 mL   LHR 18 resp/min   Peep/cpap 5.0 cm H20   pH, Arterial 7.441 7.350 - 7.450   pCO2 arterial 39.9 32.0 - 48.0 mmHg   pO2, Arterial 67.0 (L) 83.0 - 108.0 mmHg   Bicarbonate 26.7 20.0 - 28.0 mmol/L   Acid-Base Excess 2.9 (H) 0.0 - 2.0 mmol/L   O2 Saturation 93.1 %   Patient temperature 98.6    Collection site RIGHT RADIAL    Drawn by [phone]    Sample type ARTERIAL DRAW    Allens test (pass/fail) PASS PASS  MRSA PCR Screening     Status: None   Collection Time: 05/26/16  5:20 PM  Result Value Ref Range   MRSA by PCR NEGATIVE NEGATIVE    Comment:        The GeneXpert MRSA Assay (FDA approved for NASAL specimens only), is one component of a comprehensive MRSA colonization surveillance program. It is not intended to diagnose MRSA infection nor to guide or monitor treatment for MRSA infections.   Procalcitonin - Baseline     Status: None   Collection Time: 05/26/16  6:23 PM  Result Value Ref Range   Procalcitonin <0.10 ng/mL    Comment:        Interpretation: PCT (Procalcitonin) <= 0.5 ng/mL: Systemic infection (sepsis) is not likely. Local bacterial infection is possible. (NOTE)         ICU PCT Algorithm               Non ICU PCT Algorithm    ----------------------------     ------------------------------         PCT < 0.25 ng/mL                 PCT < 0.1 ng/mL     Stopping of antibiotics            Stopping of antibiotics       strongly encouraged.               strongly  encouraged.    ----------------------------     ------------------------------       PCT level decrease by               PCT < 0.25 ng/mL       >= 80% from peak PCT       OR PCT 0.25 - 0.5 ng/mL  Stopping of antibiotics                                             encouraged.     Stopping of antibiotics           encouraged.    ----------------------------     ------------------------------       PCT level decrease by              PCT >= 0.25 ng/mL       < 80% from peak PCT        AND PCT >= 0.5 ng/mL            Continuin g antibiotics                                              encouraged.       Continuing antibiotics            encouraged.    ----------------------------     ------------------------------     PCT level increase compared          PCT > 0.5 ng/mL         with peak PCT AND          PCT >= 0.5 ng/mL             Escalation of antibiotics                                          strongly encouraged.      Escalation of antibiotics        strongly encouraged.   Glucose, capillary     Status: Abnormal   Collection Time: 05/26/16  7:51 PM  Result Value Ref Range   Glucose-Capillary 188 (H) 65 - 99 mg/dL   Comment 1 Notify RN    Comment 2 Document in Chart   Glucose, capillary     Status: Abnormal   Collection Time: 05/26/16 11:32 PM  Result Value Ref Range   Glucose-Capillary 223 (H) 65 - 99 mg/dL   Comment 1 Notify RN    Comment 2 Document in Chart   Procalcitonin     Status: None   Collection Time: 05/27/16  3:43 AM  Result Value Ref Range   Procalcitonin 0.96 ng/mL    Comment:        Interpretation: PCT > 0.5 ng/mL and <= 2 ng/mL: Systemic infection (sepsis) is possible, but other conditions are known to elevate PCT as well. (NOTE)         ICU PCT Algorithm               Non ICU PCT Algorithm    ----------------------------     ------------------------------         PCT < 0.25 ng/mL                 PCT < 0.1 ng/mL     Stopping of antibiotics             Stopping of antibiotics       strongly encouraged.  strongly encouraged.    ----------------------------     ------------------------------       PCT level decrease by               PCT < 0.25 ng/mL       >= 80% from peak PCT       OR PCT 0.25 - 0.5 ng/mL          Stopping of antibiotics                                             encouraged.     Stopping of antibiotics           encouraged.    ----------------------------     ------------------------------       PCT level decrease by              PCT >= 0.25 ng/mL       < 80% from peak PCT        AND PCT >= 0.5 ng/mL             Continuing antibiotics                                              encouraged.       Continuing antibiotics            encouraged.    ----------------------------     ------------------------------     PCT level increase compared          PCT > 0.5 ng/mL         with peak PCT AND          PCT >= 0.5 ng/mL             Escalation of antibiotics                                          strongly encouraged.      Escalation of antibiotics        strongly encouraged.   Glucose, capillary     Status: Abnormal   Collection Time: 05/27/16  4:06 AM  Result Value Ref Range   Glucose-Capillary 239 (H) 65 - 99 mg/dL   Comment 1 Notify RN    Comment 2 Document in Chart   Glucose, capillary     Status: Abnormal   Collection Time: 05/27/16  7:58 AM  Result Value Ref Range   Glucose-Capillary 214 (H) 65 - 99 mg/dL   Comment 1 Notify RN    Comment 2 Document in Chart     Imaging / Studies: Dg Abd 1 View  Result Date: 05/26/2016 CLINICAL DATA:  75 year old male status post nasogastric tube placement. EXAM: ABDOMEN - 1 VIEW COMPARISON:  No priors. FINDINGS: Nasogastric tube tip is in the mid body of the stomach, with side port just distal to the gastroesophageal junction. Visualized bowel gas pattern is nonobstructive. Incidental imaging of the lower thorax demonstrates the presence of an aortic  valve stented bioprosthesis, and a small left pleural effusion. Median sternotomy wires. IMPRESSION: 1. Tip of nasogastric tube is in the mid body of the stomach. Electronically Signed  By: Vinnie Langton M.D.   On: 05/26/2016 18:15   Dg Chest Port 1 View  Result Date: 05/26/2016 CLINICAL DATA:  Post intubation. EXAM: PORTABLE CHEST 1 VIEW COMPARISON:  09/18/2015. FINDINGS: Patient is rotated. Endotracheal tube terminates approximately 3.6 cm above the carina. Heart size is accentuated by rotation and low lung volumes. Left hemidiaphragm appears elevated. Difficult to exclude left perihilar airspace opacification. This may be due in part to patient rotation. IMPRESSION: 1. Satisfactory endotracheal tube placement. 2. Difficult to exclude left perihilar airspace opacification. 3. Elevated left hemidiaphragm, new. Electronically Signed   By: Lorin Picket M.D.   On: 05/26/2016 16:09    Medications / Allergies: per chart  Antibiotics: Anti-infectives    Start     Dose/Rate Route Frequency Ordered Stop   05/26/16 0902  cefoTEtan (CEFOTAN) 2 g in dextrose 5 % 50 mL IVPB     2 g 100 mL/hr over 30 Minutes Intravenous On call to O.R. 05/26/16 0902 05/26/16 1241        Note: Portions of this report may have been transcribed using voice recognition software. Every effort was made to ensure accuracy; however, inadvertent computerized transcription errors may be present.   Any transcriptional errors that result from this process are unintentional.     Adin Hector, M.D., F.A.C.S. Gastrointestinal and Minimally Invasive Surgery [facility] [address] [place], [place] [address] [phone] Main / Paging   05/27/2016

## 2016-05-28 ENCOUNTER — Encounter (HOSPITAL_COMMUNITY): Payer: Self-pay | Admitting: *Deleted

## 2016-05-28 DIAGNOSIS — J42 Unspecified chronic bronchitis: Secondary | ICD-10-CM

## 2016-05-28 DIAGNOSIS — I482 Chronic atrial fibrillation: Secondary | ICD-10-CM

## 2016-05-28 DIAGNOSIS — I5033 Acute on chronic diastolic (congestive) heart failure: Secondary | ICD-10-CM

## 2016-05-28 LAB — GLUCOSE, CAPILLARY
Glucose-Capillary: 108 mg/dL — ABNORMAL HIGH (ref 65–99)
Glucose-Capillary: 128 mg/dL — ABNORMAL HIGH (ref 65–99)
Glucose-Capillary: 122 mg/dL — ABNORMAL HIGH (ref 65–99)
Glucose-Capillary: 120 mg/dL — ABNORMAL HIGH (ref 65–99)
Glucose-Capillary: 104 mg/dL — ABNORMAL HIGH (ref 65–99)
Glucose-Capillary: 114 mg/dL — ABNORMAL HIGH (ref 65–99)

## 2016-05-28 LAB — MAGNESIUM: Magnesium: 2.1 mg/dL (ref 1.7–2.4)

## 2016-05-28 LAB — PROCALCITONIN: Procalcitonin: 0.92 ng/mL

## 2016-05-28 LAB — CREATININE, SERUM
Creatinine, Ser: 1.26 mg/dL — ABNORMAL HIGH (ref 0.61–1.24)
GFR calc Af Amer: >60 mL/min (ref >60)
GFR calc non Af Amer: 55 mL/min — ABNORMAL LOW (ref >60)

## 2016-05-28 LAB — POTASSIUM: Potassium: 4.2 mmol/L (ref 3.5–5.1)

## 2016-05-28 MED ORDER — WITCH HAZEL-GLYCERIN EX PADS
1.0000 "application " | MEDICATED_PAD | CUTANEOUS | Status: DC | PRN
  Filled 2016-05-28: qty 100

## 2016-05-28 MED ORDER — WARFARIN SODIUM 5 MG PO TABS
7.5000 mg | ORAL_TABLET | Freq: Once | ORAL | Status: AC
  Administered 2016-05-28: 7.5 mg via ORAL
  Filled 2016-05-28: qty 1

## 2016-05-28 MED ORDER — SODIUM CHLORIDE 0.9% FLUSH
3.0000 mL | Freq: Two times a day (BID) | INTRAVENOUS | Status: DC
  Administered 2016-05-28 (×2): 3 mL via INTRAVENOUS

## 2016-05-28 MED ORDER — ORAL CARE MOUTH RINSE
15.0000 mL | Freq: Two times a day (BID) | OROMUCOSAL | Status: DC
  Administered 2016-05-28: 22:00:00 15 mL via OROMUCOSAL

## 2016-05-28 MED ORDER — NYSTATIN 100000 UNIT/GM EX POWD
Freq: Two times a day (BID) | CUTANEOUS | Status: DC
  Administered 2016-05-28 (×2): via TOPICAL
  Filled 2016-05-28: qty 15

## 2016-05-28 MED ORDER — INSULIN ASPART 100 UNIT/ML ~~LOC~~ SOLN
0.0000 [IU] | Freq: Three times a day (TID) | SUBCUTANEOUS | Status: DC
  Administered 2016-05-28 – 2016-05-29 (×3): 3 [IU] via SUBCUTANEOUS

## 2016-05-28 MED ORDER — SODIUM CHLORIDE 0.9% FLUSH: 3.0000 mL | INTRAVENOUS | Status: DC | PRN

## 2016-05-28 MED ORDER — SODIUM CHLORIDE 0.9 % IV SOLN: 250.0000 mL | INTRAVENOUS | Status: DC | PRN

## 2016-05-28 MED ORDER — LACTATED RINGERS IV BOLUS (SEPSIS): 1000.0000 mL | Freq: Three times a day (TID) | INTRAVENOUS | Status: DC | PRN

## 2016-05-28 MED ORDER — HYDROCORTISONE ACE-PRAMOXINE 2.5-1 % RE CREA
1.0000 "application " | TOPICAL_CREAM | Freq: Four times a day (QID) | RECTAL | Status: DC | PRN
  Filled 2016-05-28: qty 30

## 2016-05-28 MED ORDER — WARFARIN SODIUM 5 MG PO TABS: 7.5000 mg | ORAL_TABLET | ORAL | Status: DC

## 2016-05-28 MED ORDER — WARFARIN - PHARMACIST DOSING INPATIENT: Freq: Every day | Status: DC

## 2016-05-28 NOTE — Progress Notes (Signed)
ANTICOAGULATION CONSULT NOTE   Pharmacy Consult for Warfarin Indication: atrial fibrillation  Allergies  Allergen Reactions  . Pineapple Concentrate Nausea And Vomiting   Patient Measurements: Height: 5' 9" (175.3 cm) Weight: 248 lb (112.5 kg) IBW/kg (Calculated) : 70.7  Vital Signs: Temp: 98.5 F (36.9 C) (12/28 1415) Temp Source: Oral (12/28 1415) BP: 95/42 (12/28 1415) Pulse Rate: 56 (12/28 1415)  Labs:  Recent Labs  05/26/16 0920 05/27/16 0343 05/28/16 0315  LABPROT 15.8*  --   --   INR 1.26  --   --   CREATININE  --  1.12 1.26*   Estimated Creatinine Clearance: 64.5 mL/min (by C-G formula based on SCr of 1.26 mg/dL (H)).  Medical History: Past Medical History:  Diagnosis Date  . Arthritis   . Atrial fibrillation (HCC)   . CAD (coronary artery disease) 2012   [place]-LAD  . COPD (chronic obstructive pulmonary disease) (HCC)   . GERD (gastroesophageal reflux disease)   . HEART FAILURE, CONGESTIVE UNSPEC 02/23/2007   Qualifier: Diagnosis of  By: MOREIRA MD, FERNANDA    . Hyperlipidemia   . Hypertension   . OSA (obstructive sleep apnea) 11/13/2014  . S/P AVR    #25 mm Edwards pericardial valve Bioprosthetic. Dr Bartle.  . Situational depression    "son passed 11/2013"  . Sleep apnea    occ uses c pap does not know settings   . Stented coronary artery 2013  . TOBACCO DEPENDENCE 07/29/2006   Qualifier: History of  By: Alm  MD, Stephanie    . Type II diabetes mellitus (HCC)    Medications:  Scheduled:  . acetaminophen  1,000 mg Oral TID  . atorvastatin  40 mg Oral q1800  . benazepril  20 mg Oral Daily  . chlorhexidine gluconate (MEDLINE KIT)  15 mL Mouth Rinse BID  . enoxaparin (LOVENOX) injection  40 mg Subcutaneous Q24H  . furosemide  40 mg Oral BID  . glipiZIDE  10 mg Oral QAC supper  . insulin aspart  0-20 Units Subcutaneous TID WC  . insulin aspart  0-5 Units Subcutaneous QHS  . lip balm  1 application Topical BID  . mouth rinse  15 mL Mouth Rinse  BID  . metoprolol succinate  25 mg Oral Daily  . mometasone-formoterol  2 puff Inhalation BID  . multivitamin with minerals  1 tablet Oral QPM  . nystatin   Topical BID  . polyethylene glycol  34 g Oral BID  . potassium chloride  20 mEq Oral Daily  . sodium chloride flush  3 mL Intravenous Q12H  . sodium chloride flush  3 mL Intravenous Q12H  . vitamin C  500 mg Oral q morning - 10a   Assessment: 75 yoM s/p partial proctectomy 12/26 for polyp with high-grade dysplasia. Unable to remove from vent post-op, to ICU on vent, extubated 12/27, transfer to 6E on 12/28.  - Resume Warfarin for Afib, home dose 10mg daily exc 7.5mg F,Sun, last dose 12/20. - Last INR 1.26 on 12/26 - Lovenox 40mg daily from 12/27 - po intake wnl  Goal of Therapy:  INR 2-3 Monitor platelets by anticoagulation protocol: Yes   Plan:  At 1800 today:  Warfarin 7.5mg x1  Continue Lovenox 40mg daily until INR therapeutic (>/= to 2)  Daily Protime/INR  ,  L PharmD Pager [phone] 05/28/2016, 3:20 PM  

## 2016-05-28 NOTE — Progress Notes (Signed)
Transferred to room 1610 withO2 at 2l. Voided 400 cc at 1145.

## 2016-05-28 NOTE — Progress Notes (Signed)
CM received call from [facility] at [facility] and states they ar following pt.  Matthew Edwards can be reached at [phone].  CM will follow for progress.

## 2016-05-28 NOTE — Progress Notes (Addendum)
PULMONARY / CRITICAL CARE MEDICINE   Name: Matthew GARRELTS MRN: [id number] DOB: 1940/07/20    ADMISSION DATE:  05/26/2016 CONSULTATION DATE:  05/26/16  REFERRING MD:  [name] MD  CHIEF COMPLAINT:  Vent dependent respiratory failure, status post rectal polyp removal.  HISTORY OF PRESENT ILLNESS:   75 year old with history of aortic wall replacement, coronary artery disease status post CABG, A. Fib on coumadin, COPD, severe OSA, diabetes. He was admitted on 05/26/16 for resection of rectal polyp. Patient was slow to recover from anesthesia and intermittently agitated. Hence was not extubated on PACU. PCCM consulted for management of the vent.   SUBJECTIVE:  Extubated yesterday Feels OK this morning, noted some blood in stool Eating No breathing complaints, specifically no cough, wheeze or chest tightness  VITAL SIGNS: BP (!) 143/62 (BP Location: Left Arm)   Pulse 84   Temp 98.6 F (37 C) (Oral)   Resp 17   Ht 5\' 9"  (1.753 m)   Wt 112.5 kg (248 lb)   SpO2 92%   BMI 36.62 kg/m   HEMODYNAMICS:    VENTILATOR SETTINGS:    INTAKE / OUTPUT: I/O last 3 completed shifts: In: 686.7 [I.V.:336.7; Other:350] Out: 2425 [Urine:2425]  PHYSICAL EXAMINATION: General:  Awake, alert, sitting in chair Neuro:  PERRL, No focal deficits HEENT:  NCAT, OP clear Cardiovascular:  RRR, no MRG Lungs: CTA B, no wheezing, normal effort Abdomen: soft, bowel sounds positive Musculoskeletal:  Normal tone and bulk Skin:  Intact  LABS:  BMET  Recent Labs Lab 05/21/16 1349 05/27/16 0343 05/28/16 0315  NA 141 135  --   K 4.1 4.6 4.2  CL 106 102  --   CO2 28 25  --   BUN 15 14  --   CREATININE 1.10 1.12 1.26*  GLUCOSE 162* 230*  --     Electrolytes  Recent Labs Lab 05/21/16 1349 05/27/16 0343 05/28/16 0315  CALCIUM 10.2 9.4  --   MG  --   --  2.1    CBC  Recent Labs Lab 05/21/16 1349  WBC 6.1  HGB 14.8  HCT 46.4  PLT 138*    Coag's  Recent Labs Lab  05/21/16 1349 05/26/16 0920  INR 2.99 1.26    Sepsis Markers  Recent Labs Lab 05/26/16 1823 05/27/16 0343 05/28/16 0315  PROCALCITON <0.10 0.96 0.92    ABG  Recent Labs Lab 05/26/16 1710  PHART 7.441  PCO2ART 39.9  PO2ART 67.0*    Liver Enzymes No results for input(s): AST, ALT, ALKPHOS, BILITOT, ALBUMIN in the last 168 hours.  Cardiac Enzymes No results for input(s): TROPONINI, PROBNP in the last 168 hours.  Glucose  Recent Labs Lab 05/27/16 1655 05/27/16 2047 05/27/16 2122 05/28/16 0123 05/28/16 0511 05/28/16 0741  GLUCAP 94 114* 124* 108* 128* 122*    Imaging CXR 12/26 > possible left hilar infiltrate, elevated left hemidiaphragm  STUDIES:    CULTURES:   ANTIBIOTICS:   SIGNIFICANT EVENTS:   LINES/TUBES: ETT 12/26 >  DISCUSSION: 75 year old with multiple medical issues including COPD, severe OSA, coronary artery disease. Underwent rectal polyp resection > 5x4 cm mass from distal rectum. Transferred to the ICU on vent  ASSESSMENT / PLAN:  PULMONARY A: Vent dependence post op> resolved H/O severe OSA COPD P:   Continue Dulera bid Albuterol prn CPAP qHS > ordered 12/18  CARDIOVASCULAR A:  H/O CAD, s/p CABG Hypertension Afib P:  Tele monitoring > D/C Continue benazepril, metoprolol xl per home regimen Hold lasix for  now Hydralazine PRN for high BP Restart warfarin when OK by surgery  RENAL A:   No acute issues P:   Monitor BMET and UOP Replace electrolytes as needed  GASTROINTESTINAL A:   Status post distal rectal mass resection P:   Per GI surgery Diet per surgery  HEMATOLOGIC A:   History of aortic valve replacement P:  Follow CBC  INFECTIOUS A:   No evidence of infection P:   Monitor of abx  ENDOCRINE A:   DM P:   SSI coverage  NEUROLOGIC A:   Sedation while intubated P:   RASS goal: 0 Precedex gtt Fentanyl, versed PRN   FAMILY  - Updates: no family bedside - Inter-disciplinary family  meet or Palliative Care meeting due by:  06/02/10  PCCM will be available PRN, if daily medicine support is desired would consult internal medicine  OK to transfer to med-surg from my standpoint, will defer to surgery.  Roselie Awkward, MD [facility] Pager: [phone] Cell: [phone] After 3pm or if no response, call [phone]

## 2016-05-28 NOTE — Progress Notes (Signed)
Pt refuse NIV for the night he states that it makes him not feel well.

## 2016-05-28 NOTE — Progress Notes (Signed)
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 s/p TEM resection 05/26/2016 Active Problems:   COPD (chronic obstructive pulmonary disease) (HCC)   Cognitive impairment   Acute on chronic congestive heart failure with left ventricular diastolic dysfunction (HCC)   Chronic atrial fibrillation (HCC)   OSA (obstructive sleep apnea)   Type 2 diabetes mellitus with complication, without long-term current use of insulin ([facility])   2 Days Post-Op  05/26/2016  Procedure(s): TEM PARTIAL PROCTECTOMY OF RECTAL MASS EXAM UNDER ANESTHESIA   Assessment  Recovering but guarded  Plan:  -transfer to floor  -improved but guarded MS - back to baseline confusion.  No delirium/agitation -improve glc control - resistant SSI -diuresis as tolerated -VTE prophylaxis- SCDs, etc -mobilize as tolerated to help recovery  Adin Hector, M.D., F.A.C.S. Gastrointestinal and Minimally Invasive Surgery [facility] [address] [place], [place] [address] [phone] Main / Paging   05/28/2016  CARE TEAM:  PCP: Donnamae Jude, MD  Outpatient Care Team: Patient Care Team: Donnamae Jude, MD as PCP - General (Obstetrics and Gynecology) Gaye Pollack, MD (Cardiothoracic Surgery) Peter M Martinique, MD as Attending Physician (Cardiology) Blanchie Serve, MD as Consulting Physician (Internal Medicine) Doran Stabler, MD as Consulting Physician (Gastroenterology) [name], MD as Consulting Physician (General Surgery)  Inpatient Treatment Team: Treatment Team: Attending Provider: [name], MD; Consulting Physician: Md Pccm, MD; Registered Nurse: Lynnell Dike, RN; Registered Nurse: Heriberto Antigua, RN; Occupational Therapist:  Lesle Chris, OT; Physical Therapist: Mathis Fare, PT  Subjective:  On [place] Confused but close to baseline - much better & consolable RN just outside room HD stable Seen by PCCM  Objective:  Vital signs:  Vitals:   05/28/16 0300 05/28/16 0318 05/28/16 0400 05/28/16 0600  BP: 131/77  (!) 117/55 113/60  Pulse: 87  88 81  Resp: (!) 22  (!) 29 (!) 30  Temp:  99 F (37.2 C)    TempSrc:  Oral    SpO2: 95%  95% 96%  Weight:      Height:        Last BM Date: 05/27/16  Intake/Output   Yesterday:  12/27 0701 - 12/28 0700 In: 435.3 [I.V.:85.3] Out: 1225 [Urine:1225] This shift:  Total I/O In: 240 [Other:240] Out: 875 [Urine:875]  Bowel function:  Flatus: YES  BM:  No  Drain: (No drain)   Physical Exam:  General: Pt awake/alert/oriented x4 in No acute distress Eyes: PERRL, normal EOM.  Sclera clear.  No icterus Neuro: CN II-XII intact w/o focal sensory/motor deficits. Lymph: No head/neck/groin lymphadenopathy Psych:  No psychosis/paranoia.  Moderately confused - a little more than baseline.  No deirium HENT: Normocephalic, Mucus membranes moist.  No thrush Neck: Supple, No tracheal deviation Chest: No chest wall pain w good excursion CV:  Pulses intact.  Regular rhythm MS: Normal AROM mjr joints.  No obvious deformity Abdomen:  Morbidly obese &  Somewhat firm.  Mildy distended.  Nontender.  (That is his baseline).  No evidence of peritonitis.  No incarcerated hernias. GU: Foley in place Rectal: - no active bleeding Ext:  SCDs BLE.  No mjr edema.  No cyanosis Skin: No petechiae / purpura  Results:   Labs: Results for orders placed or performed  during the hospital encounter of 05/26/16 (from the past 48 hour(s))  Glucose, capillary     Status: Abnormal   Collection Time: 05/26/16  8:55 AM  Result Value Ref Range   Glucose-Capillary 178 (H) 65 - 99 mg/dL   Comment 1 Notify RN   Protime-INR     Status: Abnormal   Collection Time: 05/26/16  9:20 AM   Result Value Ref Range   Prothrombin Time 15.8 (H) 11.4 - 15.2 seconds   INR 1.26   Glucose, capillary     Status: Abnormal   Collection Time: 05/26/16  3:03 PM  Result Value Ref Range   Glucose-Capillary 190 (H) 65 - 99 mg/dL  Glucose, capillary     Status: Abnormal   Collection Time: 05/26/16  4:28 PM  Result Value Ref Range   Glucose-Capillary 198 (H) 65 - 99 mg/dL  Blood gas, arterial     Status: Abnormal   Collection Time: 05/26/16  5:10 PM  Result Value Ref Range   FIO2 40.00    Delivery systems VENTILATOR    Mode PRESSURE REGULATED VOLUME CONTROL    VT 550 mL   LHR 18 resp/min   Peep/cpap 5.0 cm H20   pH, Arterial 7.441 7.350 - 7.450   pCO2 arterial 39.9 32.0 - 48.0 mmHg   pO2, Arterial 67.0 (L) 83.0 - 108.0 mmHg   Bicarbonate 26.7 20.0 - 28.0 mmol/L   Acid-Base Excess 2.9 (H) 0.0 - 2.0 mmol/L   O2 Saturation 93.1 %   Patient temperature 98.6    Collection site RIGHT RADIAL    Drawn by [phone]    Sample type ARTERIAL DRAW    Allens test (pass/fail) PASS PASS  MRSA PCR Screening     Status: None   Collection Time: 05/26/16  5:20 PM  Result Value Ref Range   MRSA by PCR NEGATIVE NEGATIVE    Comment:        The GeneXpert MRSA Assay (FDA approved for NASAL specimens only), is one component of a comprehensive MRSA colonization surveillance program. It is not intended to diagnose MRSA infection nor to guide or monitor treatment for MRSA infections.   Procalcitonin - Baseline     Status: None   Collection Time: 05/26/16  6:23 PM  Result Value Ref Range   Procalcitonin <0.10 ng/mL    Comment:        Interpretation: PCT (Procalcitonin) <= 0.5 ng/mL: Systemic infection (sepsis) is not likely. Local bacterial infection is possible. (NOTE)         ICU PCT Algorithm               Non ICU PCT Algorithm    ----------------------------     ------------------------------         PCT < 0.25 ng/mL                 PCT < 0.1 ng/mL     Stopping of antibiotics             Stopping of antibiotics       strongly encouraged.               strongly encouraged.    ----------------------------     ------------------------------       PCT level decrease by               PCT < 0.25 ng/mL       >= 80% from peak PCT       OR PCT 0.25 -  0.5 ng/mL          Stopping of antibiotics                                             encouraged.     Stopping of antibiotics           encouraged.    ----------------------------     ------------------------------       PCT level decrease by              PCT >= 0.25 ng/mL       < 80% from peak PCT        AND PCT >= 0.5 ng/mL            Continuin g antibiotics                                              encouraged.       Continuing antibiotics            encouraged.    ----------------------------     ------------------------------     PCT level increase compared          PCT > 0.5 ng/mL         with peak PCT AND          PCT >= 0.5 ng/mL             Escalation of antibiotics                                          strongly encouraged.      Escalation of antibiotics        strongly encouraged.   Glucose, capillary     Status: Abnormal   Collection Time: 05/26/16  7:51 PM  Result Value Ref Range   Glucose-Capillary 188 (H) 65 - 99 mg/dL   Comment 1 Notify RN    Comment 2 Document in Chart   Glucose, capillary     Status: Abnormal   Collection Time: 05/26/16 11:32 PM  Result Value Ref Range   Glucose-Capillary 223 (H) 65 - 99 mg/dL   Comment 1 Notify RN    Comment 2 Document in Chart   Procalcitonin     Status: None   Collection Time: 05/27/16  3:43 AM  Result Value Ref Range   Procalcitonin 0.96 ng/mL    Comment:        Interpretation: PCT > 0.5 ng/mL and <= 2 ng/mL: Systemic infection (sepsis) is possible, but other conditions are known to elevate PCT as well. (NOTE)         ICU PCT Algorithm               Non ICU PCT Algorithm    ----------------------------     ------------------------------         PCT <  0.25 ng/mL                 PCT < 0.1 ng/mL     Stopping of antibiotics            Stopping of antibiotics       strongly encouraged.  strongly encouraged.    ----------------------------     ------------------------------       PCT level decrease by               PCT < 0.25 ng/mL       >= 80% from peak PCT       OR PCT 0.25 - 0.5 ng/mL          Stopping of antibiotics                                             encouraged.     Stopping of antibiotics           encouraged.    ----------------------------     ------------------------------       PCT level decrease by              PCT >= 0.25 ng/mL       < 80% from peak PCT        AND PCT >= 0.5 ng/mL             Continuing antibiotics                                              encouraged.       Continuing antibiotics            encouraged.    ----------------------------     ------------------------------     PCT level increase compared          PCT > 0.5 ng/mL         with peak PCT AND          PCT >= 0.5 ng/mL             Escalation of antibiotics                                          strongly encouraged.      Escalation of antibiotics        strongly encouraged.   Basic metabolic panel     Status: Abnormal   Collection Time: 05/27/16  3:43 AM  Result Value Ref Range   Sodium 135 135 - 145 mmol/L   Potassium 4.6 3.5 - 5.1 mmol/L   Chloride 102 101 - 111 mmol/L   CO2 25 22 - 32 mmol/L   Glucose, Bld 230 (H) 65 - 99 mg/dL   BUN 14 6 - 20 mg/dL   Creatinine, Ser 1.12 0.61 - 1.24 mg/dL   Calcium 9.4 8.9 - 10.3 mg/dL   GFR calc non Af Amer >60 >60 mL/min   GFR calc Af Amer >60 >60 mL/min    Comment: (NOTE) The eGFR has been calculated using the CKD EPI equation. This calculation has not been validated in all clinical situations. eGFR's persistently <60 mL/min signify possible Chronic Kidney Disease.    Anion gap 8 5 - 15  Glucose, capillary     Status: Abnormal   Collection Time: 05/27/16  4:06 AM  Result  Value Ref Range   Glucose-Capillary 239 (H) 65 - 99 mg/dL   Comment 1 Notify RN  Comment 2 Document in Chart   Glucose, capillary     Status: Abnormal   Collection Time: 05/27/16  7:58 AM  Result Value Ref Range   Glucose-Capillary 214 (H) 65 - 99 mg/dL   Comment 1 Notify RN    Comment 2 Document in Chart   Glucose, capillary     Status: None   Collection Time: 05/27/16 12:19 PM  Result Value Ref Range   Glucose-Capillary 98 65 - 99 mg/dL   Comment 1 Notify RN    Comment 2 Document in Chart   Glucose, capillary     Status: None   Collection Time: 05/27/16  4:55 PM  Result Value Ref Range   Glucose-Capillary 94 65 - 99 mg/dL   Comment 1 Notify RN    Comment 2 Document in Chart   Glucose, capillary     Status: Abnormal   Collection Time: 05/27/16  8:47 PM  Result Value Ref Range   Glucose-Capillary 114 (H) 65 - 99 mg/dL   Comment 1 Notify RN   Glucose, capillary     Status: Abnormal   Collection Time: 05/27/16  9:22 PM  Result Value Ref Range   Glucose-Capillary 124 (H) 65 - 99 mg/dL   Comment 1 Notify RN    Comment 2 Document in Chart   Glucose, capillary     Status: Abnormal   Collection Time: 05/28/16  1:23 AM  Result Value Ref Range   Glucose-Capillary 108 (H) 65 - 99 mg/dL  Procalcitonin     Status: None   Collection Time: 05/28/16  3:15 AM  Result Value Ref Range   Procalcitonin 0.92 ng/mL    Comment:        Interpretation: PCT > 0.5 ng/mL and <= 2 ng/mL: Systemic infection (sepsis) is possible, but other conditions are known to elevate PCT as well. (NOTE)         ICU PCT Algorithm               Non ICU PCT Algorithm    ----------------------------     ------------------------------         PCT < 0.25 ng/mL                 PCT < 0.1 ng/mL     Stopping of antibiotics            Stopping of antibiotics       strongly encouraged.               strongly encouraged.    ----------------------------     ------------------------------       PCT level decrease by                PCT < 0.25 ng/mL       >= 80% from peak PCT       OR PCT 0.25 - 0.5 ng/mL          Stopping of antibiotics                                             encouraged.     Stopping of antibiotics           encouraged.    ----------------------------     ------------------------------       PCT level decrease by  PCT >= 0.25 ng/mL       < 80% from peak PCT        AND PCT >= 0.5 ng/mL             Continuing antibiotics                                              encouraged.       Continuing antibiotics            encouraged.    ----------------------------     ------------------------------     PCT level increase compared          PCT > 0.5 ng/mL         with peak PCT AND          PCT >= 0.5 ng/mL             Escalation of antibiotics                                          strongly encouraged.      Escalation of antibiotics        strongly encouraged.   Glucose, capillary     Status: Abnormal   Collection Time: 05/28/16  5:11 AM  Result Value Ref Range   Glucose-Capillary 128 (H) 65 - 99 mg/dL   Comment 1 Notify RN     Imaging / Studies: Dg Abd 1 View  Result Date: 05/26/2016 CLINICAL DATA:  75 year old male status post nasogastric tube placement. EXAM: ABDOMEN - 1 VIEW COMPARISON:  No priors. FINDINGS: Nasogastric tube tip is in the mid body of the stomach, with side port just distal to the gastroesophageal junction. Visualized bowel gas pattern is nonobstructive. Incidental imaging of the lower thorax demonstrates the presence of an aortic valve stented bioprosthesis, and a small left pleural effusion. Median sternotomy wires. IMPRESSION: 1. Tip of nasogastric tube is in the mid body of the stomach. Electronically Signed   By: Vinnie Langton M.D.   On: 05/26/2016 18:15   Dg Chest Port 1 View  Result Date: 05/26/2016 CLINICAL DATA:  Post intubation. EXAM: PORTABLE CHEST 1 VIEW COMPARISON:  09/18/2015. FINDINGS: Patient is rotated. Endotracheal tube  terminates approximately 3.6 cm above the carina. Heart size is accentuated by rotation and low lung volumes. Left hemidiaphragm appears elevated. Difficult to exclude left perihilar airspace opacification. This may be due in part to patient rotation. IMPRESSION: 1. Satisfactory endotracheal tube placement. 2. Difficult to exclude left perihilar airspace opacification. 3. Elevated left hemidiaphragm, new. Electronically Signed   By: Lorin Picket M.D.   On: 05/26/2016 16:09    Medications / Allergies: per chart  Antibiotics: Anti-infectives    Start     Dose/Rate Route Frequency Ordered Stop   05/26/16 0902  cefoTEtan (CEFOTAN) 2 g in dextrose 5 % 50 mL IVPB     2 g 100 mL/hr over 30 Minutes Intravenous On call to O.R. 05/26/16 0902 05/26/16 1241        Note: Portions of this report may have been transcribed using voice recognition software. Every effort was made to ensure accuracy; however, inadvertent computerized transcription errors may be present.   Any transcriptional errors that result from this process are unintentional.  Adin Hector, M.D., F.A.C.S. Gastrointestinal and Minimally Invasive Surgery [facility] [address] [place], [place] [address] [phone] Main / Paging   05/28/2016

## 2016-05-28 NOTE — Evaluation (Signed)
Occupational Therapy Evaluation Patient Details Name: Matthew Edwards MRN: [id number] DOB: [birth year] Today's Date: 05/28/2016    History of Present Illness Pt was is s/p TEM resection for adenomatous rectal polyp with high grade dysplasia.  H/o COPD, cognitive impairment, acute or chronic heart failure and DM   Clinical Impression   This 75 year old man was admitted for the above. He lived alone and was mod I for adls prior to admission.  Pt currently needs min A overall. Will follow in acute setting with supervision level goals    Follow Up Recommendations  SNF (unless pt can have 24/7 assistance)    Equipment Recommendations   (to be further assessed)    Recommendations for Other Services       Precautions / Restrictions Precautions Precautions: Fall Restrictions Weight Bearing Restrictions: No      Mobility Bed Mobility               General bed mobility comments: performed by PT  Transfers Overall transfer level: Needs assistance Equipment used: None Transfers: Sit to/from Stand Sit to Stand: Min assist         General transfer comment: steadying assistance    Balance Overall balance assessment: Needs assistance           Standing balance-Leahy Scale: Fair Standing balance comment: min guard for safety.                             ADL Overall ADL's : Needs assistance/impaired     Grooming: Set up;Sitting   Upper Body Bathing: Set up;Sitting   Lower Body Bathing: Minimal assistance;Sit to/from stand   Upper Body Dressing : Set up;Sitting   Lower Body Dressing: Minimal assistance;Sit to/from stand   Toilet Transfer: Minimal assistance;Stand-pivot;BSC             General ADL Comments: pt was on the commode working with PT when I arrived.  He ambulated with min A for safety.  Could not follow cues to use RW safely.  Pt normally uses a SPC at home.       Vision     Perception     Praxis      Pertinent  Vitals/Pain Pain Assessment: Faces Pain Score: 0-No pain     Hand Dominance     Extremity/Trunk Assessment Upper Extremity Assessment Upper Extremity Assessment: Overall WFL for tasks assessed           Communication Communication Communication: No difficulties   Cognition Arousal/Alertness: Awake/alert Behavior During Therapy: WFL for tasks assessed/performed Overall Cognitive Status: No family/caregiver present to determine baseline cognitive functioning                 General Comments: h/o cognitive impairment:  decreased attention, following commands and safety   General Comments       Exercises       Shoulder Instructions      Home Living Family/patient expects to be discharged to:: Unsure Living Arrangements: Alone                               Additional Comments: will need snf      Prior Functioning/Environment Level of Independence: Independent with assistive device(s)        Comments: uses a cane at all times.         OT Problem List: Decreased strength;Decreased activity tolerance;Impaired  balance (sitting and/or standing);Decreased cognition;Decreased safety awareness;Decreased knowledge of use of DME or AE   OT Treatment/Interventions: Self-care/ADL training;DME and/or AE instruction;Patient/family education;Balance training;Cognitive remediation/compensation    OT Goals(Current goals can be found in the care plan section) Acute Rehab OT Goals Patient Stated Goal: none stated; agreeable to therapy OT Goal Formulation: With patient Time For Goal Achievement: 06/11/16 Potential to Achieve Goals: Good ADL Goals Pt Will Perform Grooming: with supervision;standing Pt Will Perform Lower Body Bathing: with supervision;with adaptive equipment;sit to/from stand Pt Will Perform Lower Body Dressing: with supervision;with adaptive equipment;sit to/from stand Pt Will Transfer to Toilet: with supervision;ambulating;bedside  commode;regular height toilet (vs) Pt Will Perform Toileting - Clothing Manipulation and hygiene: with supervision;sit to/from stand Additional ADL Goal #1: pt will not need any safety cues during adls  OT Frequency: Min 2X/week   Barriers to D/C: Decreased caregiver support          Co-evaluation PT/OT/SLP Co-Evaluation/Treatment: Yes Reason for Co-Treatment: For patient/therapist safety PT goals addressed during session: Mobility/safety with mobility OT goals addressed during session: ADL's and self-care      End of Session    Activity Tolerance: Patient tolerated treatment well Patient left: in chair;with call bell/phone within reach;with chair alarm set   Time: 9702-6378 OT Time Calculation (min): 10 min Charges:  OT General Charges $OT Visit: 1 Procedure OT Evaluation $OT Eval Low Complexity: 1 Procedure G-Codes:    Dov Dill Jun 01, 2016, 12:40 PM  Lesle Chris, OTR/L [phone] 2016-06-01

## 2016-05-28 NOTE — Evaluation (Signed)
Physical Therapy Evaluation Patient Details Name: Matthew Edwards MRN: [id number] DOB: 1940-10-25 Today's Date: 05/28/2016   History of Present Illness  Pt was is s/p TEM resection for adenomatous rectal polyp with high grade dysplasia.  H/o COPD, cognitive impairment, acute or chronic heart failure and DM  Clinical Impression  Pt admitted as above and presenting with functional mobility limitations 2* balance deficits, questionable safety awareness and cognitive deficits.  Pt reports living alone and still driving - would benefit from follow up rehab at SNF level unless 24/7 available at home or cognition clears.    Follow Up Recommendations SNF    Equipment Recommendations  None recommended by PT    Recommendations for Other Services OT consult     Precautions / Restrictions Precautions Precautions: Fall Restrictions Weight Bearing Restrictions: No      Mobility  Bed Mobility               General bed mobility comments: Pt OOB with nursing and in chair  Transfers Overall transfer level: Needs assistance Equipment used: None Transfers: Sit to/from Stand Sit to Stand: Min assist         General transfer comment: steadying assistance  Ambulation/Gait Ambulation/Gait assistance: Min assist Ambulation Distance (Feet): 100 Feet Assistive device: 1 person hand held assist;Rolling walker (2 wheeled) Gait Pattern/deviations: Step-through pattern;Decreased step length - right;Decreased step length - left;Shuffle;Trunk flexed Gait velocity: decr Gait velocity interpretation: Below normal speed for age/gender General Gait Details: Attempted use of RW but pt very unsafe and not following cues.  Pt ambulated with HHA and min assist for stability - desat to 89% on RA  Stairs            Wheelchair Mobility    Modified Rankin (Stroke Patients Only)       Balance Overall balance assessment: Needs assistance Sitting-balance support: No upper extremity  supported;Feet supported Sitting balance-Leahy Scale: Good     Standing balance support: No upper extremity supported Standing balance-Leahy Scale: Fair Standing balance comment: min guard for safety.                              Pertinent Vitals/Pain Pain Assessment: No/denies pain Pain Score: 0-No pain    Home Living Family/patient expects to be discharged to:: Unsure Living Arrangements: Alone               Additional Comments: will need snf unless 24/7 available or clears cognitively    Prior Function Level of Independence: Independent with assistive device(s)         Comments: uses a cane at all times.      Hand Dominance   Dominant Hand: Right    Extremity/Trunk Assessment   Upper Extremity Assessment Upper Extremity Assessment: Overall WFL for tasks assessed    Lower Extremity Assessment Lower Extremity Assessment: Overall WFL for tasks assessed    Cervical / Trunk Assessment Cervical / Trunk Assessment: Normal  Communication   Communication: No difficulties  Cognition Arousal/Alertness: Awake/alert Behavior During Therapy: Impulsive Overall Cognitive Status: No family/caregiver present to determine baseline cognitive functioning                 General Comments: h/o cognitive impairment:  decreased attention, following commands and safety    General Comments      Exercises     Assessment/Plan    PT Assessment Patient needs continued PT services  PT Problem List Decreased activity tolerance;Decreased balance;Decreased  mobility;Decreased cognition;Decreased knowledge of use of DME;Decreased safety awareness;Obesity          PT Treatment Interventions DME instruction;Gait training;Stair training;Functional mobility training;Therapeutic activities;Therapeutic exercise;Balance training;Patient/family education    PT Goals (Current goals can be found in the Care Plan section)  Acute Rehab PT Goals Patient Stated Goal:  none stated; agreeable to therapy PT Goal Formulation: With patient Time For Goal Achievement: 06/06/16 Potential to Achieve Goals: Fair    Frequency Min 3X/week   Barriers to discharge Decreased caregiver support Pt reports he lives alone.  RN unable to reach dtr to verify information    Co-evaluation PT/OT/SLP Co-Evaluation/Treatment: Yes Reason for Co-Treatment: For patient/therapist safety PT goals addressed during session: Mobility/safety with mobility OT goals addressed during session: ADL's and self-care       End of Session Equipment Utilized During Treatment: Gait belt Activity Tolerance: Patient limited by fatigue Patient left: in chair;with call bell/phone within reach;with chair alarm set Nurse Communication: Mobility status         Time: 2094-7096 PT Time Calculation (min) (ACUTE ONLY): 28 min   Charges:   PT Evaluation $PT Eval Low Complexity: 1 Procedure     PT G Codes:        Amadi Frady 06/11/2016, 1:15 PM

## 2016-05-29 LAB — PROTIME-INR
INR: 1.33
Prothrombin Time: 16.6 seconds — ABNORMAL HIGH (ref 11.4–15.2)

## 2016-05-29 LAB — GLUCOSE, CAPILLARY
Glucose-Capillary: 129 mg/dL — ABNORMAL HIGH (ref 65–99)
Glucose-Capillary: 147 mg/dL — ABNORMAL HIGH (ref 65–99)

## 2016-05-29 NOTE — Care Management Note (Signed)
Case Management Note  Patient Details  Name: Matthew Edwards MRN: [id number] Date of Birth: 1941/01/21  Subjective/Objective:   Patient already active w/[facility]-HHRN/PT/OT/aide,sw-spoke to rep Dawn. Patty-nurse informed me of dtr not answering phone when called for pick up for d/c-CM informed nurse that if dtr doesn't answer for d/c pick up then let (CSW-notified) be aware of potential abandonment if she doesn't respond in timely manner.Per nsg patient confused. No Further CM needs.                Action/Plan:d/c home w/HHC.   Expected Discharge Date:                  Expected Discharge Plan:  [place]  In-House Referral:     Discharge planning Services  CM Consult  Post Acute Care Choice:  Home Health [facility] HHRN/PT/OT/aide,sw) Choice offered to:  Adult Children  DME Arranged:    DME Agency:     HH Arranged:  RN, PT, OT, Nurse's Aide, Social Work [employer] Agency:  [facility]  Status of Service:  Completed, signed off  If discussed at [employer], dates discussed:    Additional Comments:  Dessa Phi, RN 05/29/2016, 2:32 PM

## 2016-05-29 NOTE — Care Management Note (Signed)
Case Management Note  Patient Details  Name: Matthew Edwards MRN: [id number] Date of Birth: [birth year]  Subjective/Objective:Spoke to [facility] form [facility] rep-informed of d/c home w/HHPT,[id number] ordered. [facility] will manage patient for HHPT @ home.No further CM needs.                    Action/Plan:d/c home w/HHPT.   Expected Discharge Date:                  Expected Discharge Plan:  [place]  In-House Referral:     Discharge planning Services  CM Consult  Post Acute Care Choice:    Choice offered to:     DME Arranged:    DME Agency:     HH Arranged:    [place] Agency:  [facility]  Status of Service:  Completed, signed off  If discussed at [place] of Stay Meetings, dates discussed:    Additional Comments:  Dessa Phi, RN 05/29/2016, 11:42 AM

## 2016-05-29 NOTE — Progress Notes (Signed)
Attempted to call Pavle Wiler (daughter) at [phone] and [phone]. No answer at either number.

## 2016-05-29 NOTE — Discharge Instructions (Signed)
ANORECTAL SURGERY:  POST OPERATIVE INSTRUCTIONS  ######################################################################  EAT Gradually transition to a high fiber diet with a fiber supplement over the next few weeks after discharge.  Start with a pureed / full liquid diet (see below)  WALK Walk an hour a day.  Control your pain to do that.    CONTROL PAIN Control pain so that you can walk, sleep, tolerate sneezing/coughing, go up/down stairs.  HAVE A BOWEL MOVEMENT DAILY Keep your bowels regular to avoid problems.   Take 4-6 doses of MIRALAX a day.   Adjust the Miralax until you are moving your bowels every day  to override your constipation.    CALL IF YOU HAVE PROBLEMS/CONCERNS Call if you are still struggling despite following these instructions. Call if you have concerns not answered by these instructions  ######################################################################    1. Take your usually prescribed home medications unless otherwise directed. 2. DIET: Follow a light bland diet the first 24 hours after arrival home, such as soup, liquids, crackers, etc.  Be sure to include lots of fluids daily.  Avoid fast food or heavy meals as your are more likely to get nauseated.  Eat a low fat the next few days after surgery.   3. PAIN CONTROL: a. Pain is best controlled by a usual combination of three different methods TOGETHER: i. Ice/Heat ii. Over the counter pain medication iii. Prescription pain medication b. Most patients will experience some swelling and discomfort in the anus/rectal area. and incisions.  Ice packs or heat (30-60 minutes up to 6 times a day) will help. Use ice for the first few days to help decrease swelling and bruising, then switch to heat such as warm towels, sitz baths, warm baths, etc to help relax tight/sore spots and speed recovery.  Some people prefer to use ice alone, heat alone, alternating between ice & heat.  Experiment to what works for you.   Swelling and bruising can take several weeks to resolve.   c. It is helpful to take an over-the-counter pain medication regularly for the first few weeks.  Choose one of the following that works best for you: i. Naproxen (Aleve, etc)  Two 220mg  tabs twice a day ii. Ibuprofen (Advil, etc) Three 200mg  tabs four times a day (every meal & bedtime) iii. Acetaminophen (Tylenol, etc) 500-650mg  four times a day (every meal & bedtime) d. A  prescription for pain medication (such as oxycodone, hydrocodone, etc) should be given to you upon discharge.  Take your pain medication as prescribed.  i. If you are having problems/concerns with the prescription medicine (does not control pain, nausea, vomiting, rash, itching, etc), please call us [phone] to see if we need to switch you to a different pain medicine that will work better for you and/or control your side effect better. ii. If you need a refill on your pain medication, please contact your pharmacy.  They will contact our office to request authorization. Prescriptions will not be filled after 5 pm or on week-ends.  Use a Sitz Bath 4-8 times a day for relief   [employer] A sitz bath is a warm water bath taken in the sitting position that covers only the hips and buttocks. It may be used for either healing or hygiene purposes. Sitz baths are also used to relieve pain, itching, or muscle spasms. The water may contain medicine. Moist heat will help you heal and relax.  HOME CARE INSTRUCTIONS  Take 3 to 4 sitz baths a day. 1. Fill  the bathtub half full with warm water. 2. Sit in the water and open the drain a little. 3. Turn on the warm water to keep the tub half full. Keep the water running constantly. 4. Soak in the water for 15 to 20 minutes. 5. After the sitz bath, pat the affected area dry first.   4. KEEP YOUR BOWELS REGULAR a. The goal is one bowel movement a day b. Avoid getting constipated.  Between the surgery and the pain medications, it  is common to experience some constipation.  Increasing fluid intake and taking a fiber supplement (such as Metamucil, Citrucel, FiberCon, MiraLax, etc) 1-2 times a day regularly will usually help prevent this problem from occurring.  A mild laxative (prune juice, Milk of Magnesia, MiraLax, etc) should be taken according to package directions if there are no bowel movements after 48 hours. c. Watch out for diarrhea.  If you have many loose bowel movements, simplify your diet to bland foods & liquids for a few days.  Stop any stool softeners and decrease your fiber supplement.  Switching to mild anti-diarrheal medications (Kayopectate, Pepto Bismol) can help.  If this worsens or does not improve, please call us.  5. Wound Care  a. Remove your bandages the day after surgery.  Unless discharge instructions indicate otherwise, leave your bandage dry and in place overnight.  Remove the bandage during your first bowel movement.   b. Wear an absorbent pad or soft cotton gauze in your underwear as needed to catch any drainage and help keep the area  c. Keep the area clean and dry.  Bathe / shower every day.  Keep the area clean by showering / bathing over the incision / wound.   It is okay to soak an open wound to help wash it.  Wet wipes or showers / gentle washing after bowel movements is often less traumatic than regular toilet paper. d. Dennis Bast will often notice bleeding with bowel movements.  This should slow down by the end of the first week of surgery e. Expect some drainage.  This should slow down, too, by the end of the first week of surgery.  Wear an absorbent pad or soft cotton gauze in your underwear until the drainage stops.  6. ACTIVITIES as tolerated:   a. You may resume regular (light) daily activities beginning the next day--such as daily self-care, walking, climbing stairs--gradually increasing activities as tolerated.  If you can walk 30 minutes without difficulty, it is safe to try more intense  activity such as jogging, treadmill, bicycling, low-impact aerobics, swimming, etc. b. Save the most intensive and strenuous activity for last such as sit-ups, heavy lifting, contact sports, etc  Refrain from any heavy lifting or straining until you are off narcotics for pain control.   c. DO NOT PUSH THROUGH PAIN.  Let pain be your guide: If it hurts to do something, don't do it.  Pain is your body warning you to avoid that activity for another week until the pain goes down. d. You may drive when you are no longer taking prescription pain medication, you can comfortably sit for long periods of time, and you can safely maneuver your car and apply brakes. e. Dennis Bast may have sexual intercourse when it is comfortable.  7. FOLLOW UP in our office a. Please call [facility] at [phone] to set up an appointment to see your surgeon in the office for a follow-up appointment approximately 2 weeks after your surgery. b. Make sure that you  call for this appointment the day you arrive home to insure a convenient appointment time. 10. IF YOU HAVE DISABILITY OR FAMILY LEAVE FORMS, BRING THEM TO THE OFFICE FOR PROCESSING.  DO NOT GIVE THEM TO YOUR DOCTOR.        WHEN TO CALL us [phone]: 1. Poor pain control 2. Reactions / problems with new medications (rash/itching, nausea, etc)  3. Fever over 101.5 F (38.5 C) 4. Inability to urinate 5. Nausea and/or vomiting 6. Worsening swelling or bruising 7. Continued bleeding from incision. 8. Increased pain, redness, or drainage from the incision  The clinic staff is available to answer your questions during regular business hours (8:30am-5pm).  Please dont hesitate to call and ask to speak to one of our nurses for clinical concerns.   A surgeon from [facility] is always on call at the hospitals   If you have a medical emergency, go to the nearest emergency room or call 911.    [facility], [place], [place],  [place], [place]  [address] ? MAIN: [phone] ? TOLL FREE: [phone] ? FAX [phone] [email or web address]  TRANSANAL ENDOSCOPIC MICROSURGERY (TEM)  Transanal endoscopic microsurgery (TEM) was developed as a means to provide a less invasive way to operate on the rectum.  This may needed to provide a good resection of part of the rectal wall to remove a large pre-cancerous polyp or an early cancer in which the patient cannot tolerate an open surgery or has an extremely hostile abdomen that makes the resection very risky.    The rectum is the last foot of the tubular digestive tract.  It resides in the pelvis, laying on top of your tailbone.  It is the final reservoir for stool before it is evacuated through the anus in the process of defecation, naturally stretching and contracting to allow time for someone to control bowel function.  The rectum is a common location for polyps or even a cancer.    In most cases when a polyp is found in the colon or rectum, a person often does not need to have a large resection of the rectum, but have it excised by a colonoscope.  However, some polyps are too large to be safely removed through endoscopy and require surgery.  Classically, this is done through an open incision where a low anterior resection or abdominoperineal resection where part or the entire rectum is removed.  Often, only part of a wall of the rectum needs to be removed.   Transanal endoscopic microsurgery (TEM) was developed as a means to provide a less invasive way to operate on the rectum.    Transanal endoscopic microsurgery (TEM) involves the patient to be placed under complete general anesthesia.  The anus is gently dilated and a metal tube is placed into the rectum.  Through the tube, absorbable gas is infused and long instruments are used to help access and cut out the polyp or tumor from part of the rectum.  The long instruments are also used to help sew the wound shut.  The  specimen is then sent for pathology.  The procedure itself usually takes a few hours of time.  The patient stays at least overnight.  When they can tolerate a regular diet and have adequate pain control they usually leave the hospital in one or two days.    The advantage of TEM is that as opposed to a long hospital stay, patient recovery is much faster.  People  less likely to have bowel or other problems.  Careful pre-operative selection is essential to make sure that the patient is an appropriate candidate for the surgery.  Tumors or cancers that are very large or invasive usually are much more difficult to remove by this technique and are not considered the first option.  Persons who are most appropriate for this surgery are those with large polyps that have not become cancers or early cancers in patients who have high risks with larger surgery.   Sometimes a more aggressive laparoscopic or open follow up resection is needed if a cancer is found to give the best chance at pure.    In general, surgery has a better chance at cure if a cancer is found but may not needed if it is only a precancerous polyp.  Risks to the surgery such as pain, bleeding, abscess, leak, ostomy, and death are inherent but overall the TEM procedure is less stressful and less risky to the patient than a classic colorectal resection throughthe abdomen.  Your colorectal surgeon can help decide what option is best for you.   Colon Polyps Introduction Polyps are tissue growths inside the body. Polyps can grow in many places, including the large intestine (colon). A polyp may be a round bump or a mushroom-shaped growth. You could have one polyp or several. Most colon polyps are noncancerous (benign). However, some colon polyps can become cancerous over time. What are the causes? The exact cause of colon polyps is not known. What increases the risk? This condition is more likely to develop in people who:  Have a family history of  colon cancer or colon polyps.  Are older than 65 or older than 45 if they are African American.  Have inflammatory bowel disease, such as ulcerative colitis or Crohn disease.  Are overweight.  Smoke cigarettes.  Do not get enough exercise.  Drink too much alcohol.  Eat a diet that is:  High in fat and red meat.  Low in fiber.  Had childhood cancer that was treated with abdominal radiation. What are the signs or symptoms? Most polyps do not cause symptoms. If you have symptoms, they may include:  Blood coming from your rectum when having a bowel movement.  Blood in your stool.The stool may look dark red or black.  A change in bowel habits, such as constipation or diarrhea. How is this diagnosed? This condition is diagnosed with a colonoscopy. This is a procedure that uses a lighted, flexible scope to look at the inside of your colon. How is this treated? Treatment for this condition involves removing any polyps that are found. Those polyps will then be tested for cancer. If cancer is found, your health care provider will talk to you about options for colon cancer treatment. Follow these instructions at home: Diet  Eat plenty of fiber, such as fruits, vegetables, and whole grains.  Eat foods that are high in calcium and vitamin D, such as milk, cheese, yogurt, eggs, liver, fish, and broccoli.  Limit foods high in fat, red meats, and processed meats, such as hot dogs, sausage, bacon, and lunch meats.  Maintain a healthy weight, or lose weight if recommended by your health care provider. General instructions  Do not smoke cigarettes.  Do not drink alcohol excessively.  Keep all follow-up visits as told by your health care provider. This is important. This includes keeping regularly scheduled colonoscopies. Talk to your health care provider about when you need a colonoscopy.  Exercise every day or as told by your health care provider. Contact a health care provider  if:  You have new or worsening bleeding during a bowel movement.  You have new or increased blood in your stool.  You have a change in bowel habits.  You unexpectedly lose weight. This information is not intended to replace advice given to you by your health care provider. Make sure you discuss any questions you have with your health care provider. Document Released: 02/12/2004 Document Revised: 10/24/2015 Document Reviewed: 04/08/2015  2017 Elsevier   GETTING TO [place].  ######################################################################  EAT Gradually transition to a high fiber diet with a fiber supplement over the next few weeks after discharge.  Start with a pureed / full liquid diet (see below)  WALK Walk an hour a day.  Control your pain to do that.    HAVE A BOWEL MOVEMENT DAILY Keep your bowels regular to avoid problems.  OK to try a laxative to override constipation.  OK to use an antidairrheal to slow down diarrhea.  Call if not better after 2 tries  CALL IF YOU HAVE PROBLEMS/CONCERNS Call if you are still struggling despite following these instructions. Call if you have concerns not answered by these instructions  ######################################################################   Irregular bowel habits such as constipation and diarrhea can lead to many problems over time.  Having one soft bowel movement a day is the most important way to prevent further problems.  The anorectal canal is designed to handle stretching and feces to safely manage our ability to get rid of solid waste (feces, poop, stool) out of our body.  BUT, hard constipated stools can act like ripping concrete bricks and diarrhea can be a burning fire to this very sensitive area of our body, causing inflamed hemorrhoids, anal fissures, increasing risk is perirectal abscesses, abdominal pain/bloating, an making irritable bowel worse.      The goal: ONE SOFT BOWEL MOVEMENT A DAY!  To  have soft, regular bowel movements:   Drink plenty of fluids, consider 4-6 tall glasses of water a day.    Take plenty of fiber.  Fiber is the undigested part of plant food that passes into the colon, acting s natures broom to encourage bowel motility and movement.  Fiber can absorb and hold large amounts of water. This results in a larger, bulkier stool, which is soft and easier to pass. Work gradually over several weeks up to 6 servings a day of fiber (25g a day even more if needed) in the form of: o Vegetables -- Root (potatoes, carrots, turnips), leafy green (lettuce, salad greens, celery, spinach), or cooked high residue (cabbage, broccoli, etc) o Fruit -- Fresh (unpeeled skin & pulp), Dried (prunes, apricots, cherries, etc ),  or stewed ( applesauce)  o Whole grain breads, pasta, etc (whole wheat)  o Bran cereals   Bulking Agents -- This type of water-retaining fiber generally is easily obtained each day by one of the following:  o Psyllium bran -- The psyllium plant is remarkable because its ground seeds can retain so much water. This product is available as Metamucil, Konsyl, Effersyllium, Per Diem Fiber, or the less expensive generic preparation in drug and health food stores. Although labeled a laxative, it really is not a laxative.  o Methylcellulose -- This is another fiber derived from wood which also retains water. It is available as Citrucel. o Polyethylene Glycol - and artificial fiber commonly called Miralax or Glycolax.  It is helpful for  people with gassy or bloated feelings with regular fiber o Flax Seed - a less gassy fiber than psyllium  No reading or other relaxing activity while on the toilet. If bowel movements take longer than 5 minutes, you are too constipated  AVOID CONSTIPATION.  High fiber and water intake usually takes care of this.  Sometimes a laxative is needed to stimulate more frequent bowel movements, but   Laxatives are not a good long-term solution as it  can wear the colon out.  They can help jump-start bowels if constipated, but should be relied on constantly without discussing with your doctor o Osmotics (Milk of Magnesia, Fleets phosphosoda, Magnesium citrate, MiraLax, GoLytely) are safer than  o Stimulants (Senokot, Castor Oil, Dulcolax, Ex Lax)    o Avoid taking laxatives for more than 7 days in a row.   IF SEVERELY CONSTIPATED, try a Bowel Retraining Program: o Do not use laxatives.  o Eat a diet high in roughage, such as bran cereals and leafy vegetables.  o Drink six (6) ounces of prune or apricot juice each morning.  o Eat two (2) large servings of stewed fruit each day.  o Take one (1) heaping tablespoon of a psyllium-based bulking agent twice a day. Use sugar-free sweetener when possible to avoid excessive calories.  o Eat a normal breakfast.  o Set aside 15 minutes after breakfast to sit on the toilet, but do not strain to have a bowel movement.  o If you do not have a bowel movement by the third day, use an enema and repeat the above steps.   Controlling diarrhea o Switch to liquids and simpler foods for a few days to avoid stressing your intestines further. o Avoid dairy products (especially milk & ice cream) for a short time.  The intestines often can lose the ability to digest lactose when stressed. o Avoid foods that cause gassiness or bloating.  Typical foods include beans and other legumes, cabbage, broccoli, and dairy foods.  Every person has some sensitivity to other foods, so listen to our body and avoid those foods that trigger problems for you. o Adding fiber (Citrucel, Metamucil, psyllium, Miralax) gradually can help thicken stools by absorbing excess fluid and retrain the intestines to act more normally.  Slowly increase the dose over a few weeks.  Too much fiber too soon can backfire and cause cramping & bloating. o Probiotics (such as active yogurt, Align, etc) may help repopulate the intestines and colon with normal  bacteria and calm down a sensitive digestive tract.  Most studies show it to be of mild help, though, and such products can be costly. o Medicines: - Bismuth subsalicylate (ex. Kayopectate, Pepto Bismol) every 30 minutes for up to 6 doses can help control diarrhea.  Avoid if pregnant. - Loperamide (Immodium) can slow down diarrhea.  Start with two tablets (4mg  total) first and then try one tablet every 6 hours.  Avoid if you are having fevers or severe pain.  If you are not better or start feeling worse, stop all medicines and call your doctor for advice o Call your doctor if you are getting worse or not better.  Sometimes further testing (cultures, endoscopy, X-ray studies, bloodwork, etc) may be needed to help diagnose and treat the cause of the diarrhea.  TROUBLESHOOTING IRREGULAR BOWELS 1) Avoid extremes of bowel movements (no bad constipation/diarrhea) 2) Miralax 17gm mixed in 8oz. water or juice-daily. May use BID as needed.  3) Gas-x,Phazyme, etc. as needed for gas &  bloating.  4) Soft,bland diet. No spicy,greasy,fried foods.  5) Prilosec over-the-counter as needed  6) May hold gluten/wheat products from diet to see if symptoms improve.  7)  May try probiotics (Align, Activa, etc) to help calm the bowels down 7) If symptoms become worse call back immediately.   Constipation, Adult Constipation is when a person has fewer bowel movements in a week than normal, has difficulty having a bowel movement, or has stools that are dry, hard, or larger than normal. Constipation may be caused by an underlying condition. It may become worse with age if a person takes certain medicines and does not take in enough fluids. Follow these instructions at home: Eating and drinking  Eat foods that have a lot of fiber, such as fresh fruits and vegetables, whole grains, and beans.  Limit foods that are high in fat, low in fiber, or overly processed, such as french fries, hamburgers, cookies, candies, and  soda.  Drink enough fluid to keep your urine clear or pale yellow. General instructions  Exercise regularly or as told by your health care provider.  Go to the restroom when you have the urge to go. Do not hold it in.  Take over-the-counter and prescription medicines only as told by your health care provider. These include any fiber supplements.  Practice pelvic floor retraining exercises, such as deep breathing while relaxing the lower abdomen and pelvic floor relaxation during bowel movements.  Watch your condition for any changes.  Keep all follow-up visits as told by your health care provider. This is important. Contact a health care provider if:  You have pain that gets worse.  You have a fever.  You do not have a bowel movement after 4 days.  You vomit.  You are not hungry.  You lose weight.  You are bleeding from the anus.  You have thin, pencil-like stools. Get help right away if:  You have a fever and your symptoms suddenly get worse.  You leak stool or have blood in your stool.  Your abdomen is bloated.  You have severe pain in your abdomen.  You feel dizzy or you faint. This information is not intended to replace advice given to you by your health care provider. Make sure you discuss any questions you have with your health care provider. Document Released: 02/14/2004 Document Revised: 12/06/2015 Document Reviewed: 11/06/2015 Elsevier Interactive Patient Education  2017 [employer].

## 2016-05-29 NOTE — Progress Notes (Signed)
Attempted to call patient's daughter to confirm she is coming to hospital to get her dad -or to speak with him about placement -no answer.

## 2016-05-29 NOTE — Progress Notes (Signed)
CSW met with pt this am to assist with d/c planning. Pt is ready for d/c. MD notes pt has declined SNF. CSW reviewed PT recommendations with pt. Pt continues to decline SNF. CSW will remain available to assist with SNF placement if pt changes his mind. Otherwise, RNCM will assist with d/c planning.  Werner Lean LCSW [phone]

## 2016-05-29 NOTE — Discharge Summary (Signed)
Physician Discharge Summary  Patient ID: Matthew Edwards MRN: [id number] DOB/AGE: 05-Nov-1940  75 y.o.  Admit date: 05/26/2016 Discharge date:   Patient Care Team: Donnamae Jude, MD as PCP - General (Obstetrics and Gynecology) Gaye Pollack, MD (Cardiothoracic Surgery) Peter M Martinique, MD as Attending Physician (Cardiology) Blanchie Serve, MD as Consulting Physician (Internal Medicine) Doran Stabler, MD as Consulting Physician (Gastroenterology) [name], MD as Consulting Physician (General Surgery)  Discharge Diagnoses:  Principal Problem:   Adenomatous rectal polyp with high grade dysplasia s/p TEM resection 05/26/2016 Active Problems:   COPD (chronic obstructive pulmonary disease) ([place])   Cognitive impairment   Acute on chronic congestive heart failure with left ventricular diastolic dysfunction ([place])   Chronic atrial fibrillation ([place])   OSA (obstructive sleep apnea)   Type 2 diabetes mellitus with complication, without long-term current use of insulin ([place])   POST-OPERATIVE DIAGNOSIS:   RECTAL  POLYP WITH HIGH GRADE DYSPLASIA  SURGERY:  05/26/2016  Procedure(s): TEM PARTIAL PROCTECTOMY OF RECTAL MASS EXAM UNDER ANESTHESIA  SURGEON:    Surgeon(s): [name], MD  Consults: pulmonary/intensive care  Hospital Course:   The patient underwent the surgery above.  Postoperatively, he required overnight intubated due to CO2 retention and weakness.  Extubated POD#1.  Stepdown status POD#1.  Transferred to floor next day.  The patient gradually mobilized and advanced to a solid diet.  Back to walking with a cane.  Declined SNF/rehab.  Pain and other symptoms were treated.    By the time of discharge, the patient was walking with a cane in the hallways, eating food, having flatus.  Pain was well-controlled on an oral medications.  Based on meeting discharge criteria and continuing to recover, I felt it was safe for the patient to be discharged from the hospital to  further recover with close followup. Postoperative recommendations were discussed in detail.  They are written as well.  Discharged Condition: good  Disposition:  Follow-up Information    Keila Turan C., MD. Schedule an appointment as soon as possible for a visit in 3 weeks.   Specialty:  General Surgery Why:  To follow up after your operation Contact information: [address] [address] [phone]           01-Home or Self Care  Discharge Instructions    Call MD for:    Complete by:  As directed    FEVER >101.5 F (Temperatures <101.80F occasionally happen and are not significant)   Call MD for:    Complete by:  As directed    FEVER >101.5 F (Temperatures <101.80F occasionally happen and are not significant)   Call MD for:  extreme fatigue    Complete by:  As directed    Call MD for:  extreme fatigue    Complete by:  As directed    Call MD for:  hives    Complete by:  As directed    Call MD for:  persistant dizziness or light-headedness    Complete by:  As directed    Call MD for:  persistant dizziness or light-headedness    Complete by:  As directed    Call MD for:  persistant dizziness or light-headedness    Complete by:  As directed    Call MD for:  persistant nausea and vomiting    Complete by:  As directed    Call MD for:  persistant nausea and vomiting    Complete by:  As directed  Call MD for:  persistant nausea and vomiting    Complete by:  As directed    Call MD for:  redness, tenderness, or signs of infection (pain, swelling, redness, odor or green/yellow discharge around incision site)    Complete by:  As directed    Call MD for:  redness, tenderness, or signs of infection (pain, swelling, redness, odor or green/yellow discharge around incision site)    Complete by:  As directed    Call MD for:  redness, tenderness, or signs of infection (pain, swelling, redness, odor or green/yellow discharge around incision site)    Complete  by:  As directed    You will often notice bleeding with bowel movements.  Expect some yellow or tan drainage.  This can occur for weeks, but should be mild by the end of the first week of surgery.  Wear an absorbent pad or soft cotton gauze in your underwear until the drainage stops   Call MD for:  severe uncontrolled pain    Complete by:  As directed    Call MD for:  severe uncontrolled pain    Complete by:  As directed    Call MD for:  severe uncontrolled pain    Complete by:  As directed    Diet - low sodium heart healthy    Complete by:  As directed    Follow a light diet the first few days at home.  Start with a bland diet such as soups, liquids, starchy foods, low fat foods, etc.   If you feel full, bloated, or constipated, stay on a full liquid or pureed/blenderized diet for a few days until you feel better and no longer constipated. Gradually get back to a regular solid diet.  Avoid fast food or heavy meals the first week as you are more likely to get nauseated.   Diet - low sodium heart healthy    Complete by:  As directed    Follow a light diet the first few days at home.  Start with a bland diet such as soups, liquids, starchy foods, low fat foods, etc.   If you feel full, bloated, or constipated, stay on a full liquid or pureed/blenderized diet for a few days until you feel better and no longer constipated. Gradually get back to a regular solid diet.  Avoid fast food or heavy meals the first week as you are more likely to get nauseated.   Discharge instructions    Complete by:  As directed    One the day of your discharge from the hospital (or the next business weekday), please call [facility] to set up or confirm an appointment to see your surgeon in the office for a follow-up appointment.  Usually it is 2-3 weeks after your surgery.  Other concerns If you are not getting better after two weeks or are noticing you are getting worse, contact our office [phone]  [phone] for further advice.  We may need to adjust your medications, re-evaluate you in the office, send you to the emergency room, or see what other things we can do to help. The clinic staff is available to answer your questions during regular business hours (8:30am-5pm).  Please don't hesitate to call and ask to speak to one of our nurses for clinical concerns.    A surgeon from [facility] is always on call at the hospitals 24 hours/day If you have a medical emergency, go to the nearest emergency room or call 911.  Discharge instructions    Complete by:  As directed    One the day of your discharge from the hospital (or the next business weekday), please call [facility] to set up or confirm an appointment to see your surgeon in the office for a follow-up appointment.  Usually it is 2-3 weeks after your surgery.  Other concerns If you are not getting better after two weeks or are noticing you are getting worse, contact our office [phone] for further advice.  We may need to adjust your medications, re-evaluate you in the office, send you to the emergency room, or see what other things we can do to help. The clinic staff is available to answer your questions during regular business hours (8:30am-5pm).  Please don't hesitate to call and ask to speak to one of our nurses for clinical concerns.    A surgeon from [facility] is always on call at the hospitals 24 hours/day If you have a medical emergency, go to the nearest emergency room or call 911.   Discharge instructions    Complete by:  As directed    See Rectal Surgery instruction sheet   Discharge wound care:    Complete by:  As directed    See rectal surgery care.   Discharge wound care:    Complete by:  As directed    Wear an absorbent pad or soft cotton gauze in your underwear as needed to catch any drainage and help keep the area  Keep the area clean and dry.  Bathe / shower every day.   Keep the area clean by showering / bathing over the incision / wound.   It is okay to soak an open wound to help wash it.   Wet wipes or showers / gentle washing after bowel movements is often less traumatic than regular toilet paper. You will often notice bleeding with bowel movements.  This should slow down by the end of the first week of surgery Expect some drainage for a few weeks.  This should slow down by the end of the first week of surgery.  Wear an absorbent pad or soft cotton gauze in your underwear until the drainage stops.   Driving Restrictions    Complete by:  As directed    You may drive when you are no longer taking prescription pain medication, you can comfortably wear a seatbelt, and you can safely maneuver your car and apply brakes.   Driving Restrictions    Complete by:  As directed    You may drive when you are no longer taking prescription pain medication, you can comfortably wear a seatbelt, and you can safely maneuver your car and apply brakes.   Driving Restrictions    Complete by:  As directed    You may drive when you are no longer taking prescription pain medication, you can comfortably sit for long periods of time, and you can safely maneuver your car and apply brakes.   Increase activity slowly    Complete by:  As directed    Increase activity slowly    Complete by:  As directed    Increase activity slowly    Complete by:  As directed    Lifting restrictions    Complete by:  As directed    You may resume regular (light) daily activities beginning the next day-such as daily self-care, walking, climbing stairs-gradually increasing activities as tolerated.   If you can walk 30 minutes without difficulty, it  is safe to try more intense activity such as jogging, treadmill, bicycling, low-impact aerobics, swimming, etc. Save the most intensive and strenuous activity for last such as sit-ups, heavy lifting, contact sports, etc   Refrain from any heavy lifting or  straining until you are off narcotics for pain control.   DO NOT PUSH THROUGH PAIN.   Let pain be your guide: If it hurts to do something, don't do it.   Pain is your body warning you to avoid that activity for another week until the pain goes down.   Lifting restrictions    Complete by:  As directed    You may resume regular (light) daily activities beginning the next day-such as daily self-care, walking, climbing stairs-gradually increasing activities as tolerated.   If you can walk 30 minutes without difficulty, it is safe to try more intense activity such as jogging, treadmill, bicycling, low-impact aerobics, swimming, etc. Save the most intensive and strenuous activity for last such as sit-ups, heavy lifting, contact sports, etc   Refrain from any heavy lifting or straining until you are off narcotics for pain control.   DO NOT PUSH THROUGH PAIN.   Let pain be your guide: If it hurts to do something, don't do it.   Pain is your body warning you to avoid that activity for another week until the pain goes down.   Lifting restrictions    Complete by:  As directed    You may resume regular (light) daily activities beginning the next day-such as daily self-care, walking, climbing stairs-gradually increasing activities as tolerated.  If you can walk 30 minutes without difficulty, it is safe to try more intense activity such as jogging, treadmill, bicycling, low-impact aerobics, swimming, etc. Save the most intensive and strenuous activity for last such as sit-ups, heavy lifting, contact sports, etc  Refrain from any heavy lifting or straining until you are off narcotics for pain control.  Remember: if it hurts to do it, don't do it: STOP   May shower / Bathe    Complete by:  As directed    Wash / shower every day.  You may shower over the dressings as they are waterproof.  Continue to shower over incision(s) after the dressing is off.   May shower / Bathe    Complete by:  As directed    Wash /  shower every day.  You may shower over the dressings as they are waterproof.  Continue to shower over incision(s) after the dressing is off.   May shower / Bathe    Complete by:  As directed    Warm water sitz baths/tub soaks x 20-30 minutes, 4-8 times a day for comfort   May walk up steps    Complete by:  As directed    May walk up steps    Complete by:  As directed    May walk up steps    Complete by:  As directed    Remove dressing in 72 hours    Complete by:  As directed    Remove your waterproof bandages 3-5 days after surgery.   You may leave the incision(s) open to air.   You may replace a dressing/Band-Aid to cover the incision for comfort if you wish.   Remove dressing in 72 hours    Complete by:  As directed    Remove your waterproof bandages 3-5 days after surgery.   You may leave the incision(s) open to air.   You may replace a  dressing/Band-Aid to cover the incision for comfort if you wish.   Sexual Activity Restrictions    Complete by:  As directed    You may have sexual intercourse when it is comfortable. If it hurts to do something, stop.   Sexual Activity Restrictions    Complete by:  As directed    You may have sexual intercourse when it is comfortable. If it hurts to do something, stop.   Sexual Activity Restrictions    Complete by:  As directed    You may have sexual intercourse when it is comfortable. If it hurts to do it, STOP.      Allergies as of 05/29/2016      Reactions   Pineapple Concentrate Nausea And Vomiting      Medication List    TAKE these medications   ACCU-CHEK AVIVA PLUS w/Device Kit 1 Units by Does not apply route 3 (three) times daily.   albuterol 108 (90 Base) MCG/ACT inhaler Commonly known as:  PROVENTIL HFA;VENTOLIN HFA Inhale 2 puffs into the lungs every 6 (six) hours as needed for wheezing or shortness of breath.   atorvastatin 40 MG tablet Commonly known as:  LIPITOR Take 1 tablet (40 mg total) by mouth daily at 6 PM.    benazepril 20 MG tablet Commonly known as:  LOTENSIN Take 1 tablet (20 mg total) by mouth daily.   budesonide-formoterol 80-4.5 MCG/ACT inhaler Commonly known as:  SYMBICORT Inhale 2 puffs into the lungs 2 (two) times daily.   furosemide 40 MG tablet Commonly known as:  LASIX Take 1 tablet (40 mg total) by mouth 2 (two) times daily.   glipiZIDE 10 MG tablet Commonly known as:  GLUCOTROL Take 1 tablet by mouth at 5 PM with a meal.   glucose blood test strip Commonly known as:  ACCU-CHEK AVIVA PLUS USE TO CHECK BLOOD SUGARS FOUR TIMES DAILY   metoprolol succinate 25 MG 24 hr tablet Commonly known as:  TOPROL-XL Take 1 tablet (25 mg total) by mouth daily. Reported on 11/28/2015   multivitamin with minerals tablet Take 1 tablet by mouth every evening.   nitroGLYCERIN 0.4 MG SL tablet Commonly known as:  NITROSTAT Place 1 tablet (0.4 mg total) under the tongue every 5 (five) minutes as needed. For chest pain   oxyCODONE 5 MG immediate release tablet Commonly known as:  Oxy IR/ROXICODONE Take 1-2 tablets (5-10 mg total) by mouth every 6 (six) hours as needed for moderate pain, severe pain or breakthrough pain.   polyethylene glycol powder powder Commonly known as:  GLYCOLAX/MIRALAX Take 17 g by mouth 3 times/day as needed-between meals & bedtime for moderate constipation or severe constipation (Goal is 1-2 BM/day.  Increase to up to 6 doses a day). What changed:  when to take this  reasons to take this   potassium chloride SA 20 MEQ tablet Commonly known as:  K-DUR,KLOR-CON Take 1 tablet (20 mEq total) by mouth daily.   vitamin C 500 MG tablet Commonly known as:  ASCORBIC ACID Take 1 tablet (500 mg total) by mouth every morning.   VITAMIN D-3 PO Take 1,000 Units by mouth daily.   warfarin 5 MG tablet Commonly known as:  COUMADIN Take 1 tablet (5 mg total) by mouth daily. What changed:  how much to take  when to take this  additional instructions        Significant Diagnostic Studies:  Results for orders placed or performed during the hospital encounter of 05/26/16 (from the past 72  hour(s))  Glucose, capillary     Status: Abnormal   Collection Time: 05/26/16  8:55 AM  Result Value Ref Range   Glucose-Capillary 178 (H) 65 - 99 mg/dL   Comment 1 Notify RN   Protime-INR     Status: Abnormal   Collection Time: 05/26/16  9:20 AM  Result Value Ref Range   Prothrombin Time 15.8 (H) 11.4 - 15.2 seconds   INR 1.26   Glucose, capillary     Status: Abnormal   Collection Time: 05/26/16  3:03 PM  Result Value Ref Range   Glucose-Capillary 190 (H) 65 - 99 mg/dL  Glucose, capillary     Status: Abnormal   Collection Time: 05/26/16  4:28 PM  Result Value Ref Range   Glucose-Capillary 198 (H) 65 - 99 mg/dL  Blood gas, arterial     Status: Abnormal   Collection Time: 05/26/16  5:10 PM  Result Value Ref Range   FIO2 40.00    Delivery systems VENTILATOR    Mode PRESSURE REGULATED VOLUME CONTROL    VT 550 mL   LHR 18 resp/min   Peep/cpap 5.0 cm H20   pH, Arterial 7.441 7.350 - 7.450   pCO2 arterial 39.9 32.0 - 48.0 mmHg   pO2, Arterial 67.0 (L) 83.0 - 108.0 mmHg   Bicarbonate 26.7 20.0 - 28.0 mmol/L   Acid-Base Excess 2.9 (H) 0.0 - 2.0 mmol/L   O2 Saturation 93.1 %   Patient temperature 98.6    Collection site RIGHT RADIAL    Drawn by [phone]    Sample type ARTERIAL DRAW    Allens test (pass/fail) PASS PASS  MRSA PCR Screening     Status: None   Collection Time: 05/26/16  5:20 PM  Result Value Ref Range   MRSA by PCR NEGATIVE NEGATIVE    Comment:        The GeneXpert MRSA Assay (FDA approved for NASAL specimens only), is one component of a comprehensive MRSA colonization surveillance program. It is not intended to diagnose MRSA infection nor to guide or monitor treatment for MRSA infections.   Procalcitonin - Baseline     Status: None   Collection Time: 05/26/16  6:23 PM  Result Value Ref Range   Procalcitonin <0.10 ng/mL     Comment:        Interpretation: PCT (Procalcitonin) <= 0.5 ng/mL: Systemic infection (sepsis) is not likely. Local bacterial infection is possible. (NOTE)         ICU PCT Algorithm               Non ICU PCT Algorithm    ----------------------------     ------------------------------         PCT < 0.25 ng/mL                 PCT < 0.1 ng/mL     Stopping of antibiotics            Stopping of antibiotics       strongly encouraged.               strongly encouraged.    ----------------------------     ------------------------------       PCT level decrease by               PCT < 0.25 ng/mL       >= 80% from peak PCT       OR PCT 0.25 - 0.5 ng/mL  Stopping of antibiotics                                             encouraged.     Stopping of antibiotics           encouraged.    ----------------------------     ------------------------------       PCT level decrease by              PCT >= 0.25 ng/mL       < 80% from peak PCT        AND PCT >= 0.5 ng/mL            Continuin g antibiotics                                              encouraged.       Continuing antibiotics            encouraged.    ----------------------------     ------------------------------     PCT level increase compared          PCT > 0.5 ng/mL         with peak PCT AND          PCT >= 0.5 ng/mL             Escalation of antibiotics                                          strongly encouraged.      Escalation of antibiotics        strongly encouraged.   Glucose, capillary     Status: Abnormal   Collection Time: 05/26/16  7:51 PM  Result Value Ref Range   Glucose-Capillary 188 (H) 65 - 99 mg/dL   Comment 1 Notify RN    Comment 2 Document in Chart   Glucose, capillary     Status: Abnormal   Collection Time: 05/26/16 11:32 PM  Result Value Ref Range   Glucose-Capillary 223 (H) 65 - 99 mg/dL   Comment 1 Notify RN    Comment 2 Document in Chart   Procalcitonin     Status: None   Collection Time:  05/27/16  3:43 AM  Result Value Ref Range   Procalcitonin 0.96 ng/mL    Comment:        Interpretation: PCT > 0.5 ng/mL and <= 2 ng/mL: Systemic infection (sepsis) is possible, but other conditions are known to elevate PCT as well. (NOTE)         ICU PCT Algorithm               Non ICU PCT Algorithm    ----------------------------     ------------------------------         PCT < 0.25 ng/mL                 PCT < 0.1 ng/mL     Stopping of antibiotics            Stopping of antibiotics       strongly encouraged.  strongly encouraged.    ----------------------------     ------------------------------       PCT level decrease by               PCT < 0.25 ng/mL       >= 80% from peak PCT       OR PCT 0.25 - 0.5 ng/mL          Stopping of antibiotics                                             encouraged.     Stopping of antibiotics           encouraged.    ----------------------------     ------------------------------       PCT level decrease by              PCT >= 0.25 ng/mL       < 80% from peak PCT        AND PCT >= 0.5 ng/mL             Continuing antibiotics                                              encouraged.       Continuing antibiotics            encouraged.    ----------------------------     ------------------------------     PCT level increase compared          PCT > 0.5 ng/mL         with peak PCT AND          PCT >= 0.5 ng/mL             Escalation of antibiotics                                          strongly encouraged.      Escalation of antibiotics        strongly encouraged.   Basic metabolic panel     Status: Abnormal   Collection Time: 05/27/16  3:43 AM  Result Value Ref Range   Sodium 135 135 - 145 mmol/L   Potassium 4.6 3.5 - 5.1 mmol/L   Chloride 102 101 - 111 mmol/L   CO2 25 22 - 32 mmol/L   Glucose, Bld 230 (H) 65 - 99 mg/dL   BUN 14 6 - 20 mg/dL   Creatinine, Ser 1.12 0.61 - 1.24 mg/dL   Calcium 9.4 8.9 - 10.3 mg/dL   GFR calc  non Af Amer >60 >60 mL/min   GFR calc Af Amer >60 >60 mL/min    Comment: (NOTE) The eGFR has been calculated using the CKD EPI equation. This calculation has not been validated in all clinical situations. eGFR's persistently <60 mL/min signify possible Chronic Kidney Disease.    Anion gap 8 5 - 15  Glucose, capillary     Status: Abnormal   Collection Time: 05/27/16  4:06 AM  Result Value Ref Range   Glucose-Capillary 239 (H) 65 - 99 mg/dL   Comment 1 Notify RN  Comment 2 Document in Chart   Glucose, capillary     Status: Abnormal   Collection Time: 05/27/16  7:58 AM  Result Value Ref Range   Glucose-Capillary 214 (H) 65 - 99 mg/dL   Comment 1 Notify RN    Comment 2 Document in Chart   Glucose, capillary     Status: None   Collection Time: 05/27/16 12:19 PM  Result Value Ref Range   Glucose-Capillary 98 65 - 99 mg/dL   Comment 1 Notify RN    Comment 2 Document in Chart   Glucose, capillary     Status: None   Collection Time: 05/27/16  4:55 PM  Result Value Ref Range   Glucose-Capillary 94 65 - 99 mg/dL   Comment 1 Notify RN    Comment 2 Document in Chart   Glucose, capillary     Status: Abnormal   Collection Time: 05/27/16  8:47 PM  Result Value Ref Range   Glucose-Capillary 114 (H) 65 - 99 mg/dL   Comment 1 Notify RN   Glucose, capillary     Status: Abnormal   Collection Time: 05/27/16  9:22 PM  Result Value Ref Range   Glucose-Capillary 124 (H) 65 - 99 mg/dL   Comment 1 Notify RN    Comment 2 Document in Chart   Glucose, capillary     Status: Abnormal   Collection Time: 05/28/16  1:23 AM  Result Value Ref Range   Glucose-Capillary 108 (H) 65 - 99 mg/dL  Procalcitonin     Status: None   Collection Time: 05/28/16  3:15 AM  Result Value Ref Range   Procalcitonin 0.92 ng/mL    Comment:        Interpretation: PCT > 0.5 ng/mL and <= 2 ng/mL: Systemic infection (sepsis) is possible, but other conditions are known to elevate PCT as well. (NOTE)         ICU PCT  Algorithm               Non ICU PCT Algorithm    ----------------------------     ------------------------------         PCT < 0.25 ng/mL                 PCT < 0.1 ng/mL     Stopping of antibiotics            Stopping of antibiotics       strongly encouraged.               strongly encouraged.    ----------------------------     ------------------------------       PCT level decrease by               PCT < 0.25 ng/mL       >= 80% from peak PCT       OR PCT 0.25 - 0.5 ng/mL          Stopping of antibiotics                                             encouraged.     Stopping of antibiotics           encouraged.    ----------------------------     ------------------------------       PCT level decrease by  PCT >= 0.25 ng/mL       < 80% from peak PCT        AND PCT >= 0.5 ng/mL             Continuing antibiotics                                              encouraged.       Continuing antibiotics            encouraged.    ----------------------------     ------------------------------     PCT level increase compared          PCT > 0.5 ng/mL         with peak PCT AND          PCT >= 0.5 ng/mL             Escalation of antibiotics                                          strongly encouraged.      Escalation of antibiotics        strongly encouraged.   Magnesium     Status: None   Collection Time: 05/28/16  3:15 AM  Result Value Ref Range   Magnesium 2.1 1.7 - 2.4 mg/dL  Potassium     Status: None   Collection Time: 05/28/16  3:15 AM  Result Value Ref Range   Potassium 4.2 3.5 - 5.1 mmol/L  Creatinine, serum     Status: Abnormal   Collection Time: 05/28/16  3:15 AM  Result Value Ref Range   Creatinine, Ser 1.26 (H) 0.61 - 1.24 mg/dL   GFR calc non Af Amer 55 (L) >60 mL/min   GFR calc Af Amer >60 >60 mL/min    Comment: (NOTE) The eGFR has been calculated using the CKD EPI equation. This calculation has not been validated in all clinical situations. eGFR's persistently  <60 mL/min signify possible Chronic Kidney Disease.   Glucose, capillary     Status: Abnormal   Collection Time: 05/28/16  5:11 AM  Result Value Ref Range   Glucose-Capillary 128 (H) 65 - 99 mg/dL   Comment 1 Notify RN   Glucose, capillary     Status: Abnormal   Collection Time: 05/28/16  7:41 AM  Result Value Ref Range   Glucose-Capillary 122 (H) 65 - 99 mg/dL  Glucose, capillary     Status: Abnormal   Collection Time: 05/28/16 12:46 PM  Result Value Ref Range   Glucose-Capillary 120 (H) 65 - 99 mg/dL  Glucose, capillary     Status: Abnormal   Collection Time: 05/28/16  5:09 PM  Result Value Ref Range   Glucose-Capillary 104 (H) 65 - 99 mg/dL  Glucose, capillary     Status: Abnormal   Collection Time: 05/28/16  9:19 PM  Result Value Ref Range   Glucose-Capillary 114 (H) 65 - 99 mg/dL  Protime-INR     Status: Abnormal   Collection Time: 05/29/16  5:01 AM  Result Value Ref Range   Prothrombin Time 16.6 (H) 11.4 - 15.2 seconds   INR 1.33   Glucose, capillary     Status: Abnormal   Collection Time: 05/29/16  7:27 AM  Result  Value Ref Range   Glucose-Capillary 129 (H) 65 - 99 mg/dL    Dg Abd 1 View  Result Date: 05/26/2016 CLINICAL DATA:  75 year old male status post nasogastric tube placement. EXAM: ABDOMEN - 1 VIEW COMPARISON:  No priors. FINDINGS: Nasogastric tube tip is in the mid body of the stomach, with side port just distal to the gastroesophageal junction. Visualized bowel gas pattern is nonobstructive. Incidental imaging of the lower thorax demonstrates the presence of an aortic valve stented bioprosthesis, and a small left pleural effusion. Median sternotomy wires. IMPRESSION: 1. Tip of nasogastric tube is in the mid body of the stomach. Electronically Signed   By: Vinnie Langton M.D.   On: 05/26/2016 18:15   Dg Chest Port 1 View  Result Date: 05/26/2016 CLINICAL DATA:  Post intubation. EXAM: PORTABLE CHEST 1 VIEW COMPARISON:  09/18/2015. FINDINGS: Patient is  rotated. Endotracheal tube terminates approximately 3.6 cm above the carina. Heart size is accentuated by rotation and low lung volumes. Left hemidiaphragm appears elevated. Difficult to exclude left perihilar airspace opacification. This may be due in part to patient rotation. IMPRESSION: 1. Satisfactory endotracheal tube placement. 2. Difficult to exclude left perihilar airspace opacification. 3. Elevated left hemidiaphragm, new. Electronically Signed   By: Lorin Picket M.D.   On: 05/26/2016 16:09    Discharge Exam: Blood pressure 122/61, pulse 67, temperature 99 F (37.2 C), temperature source Oral, resp. rate 16, height '5\' 9"'$  (1.753 m), weight 112.5 kg (248 lb), SpO2 95 %.  General: Pt awake/alert/oriented x4 in No acute distress Eyes: PERRL, normal EOM.  Sclera clear.  No icterus Neuro: CN II-XII intact w/o focal sensory/motor deficits. Lymph: No head/neck/groin lymphadenopathy Psych:  No psychosis/paranoia.  Moderately confused - a little more than baseline.  No deirium HENT: Normocephalic, Mucus membranes moist.  No thrush Neck: Supple, No tracheal deviation Chest: No chest wall pain w good excursion CV:  Pulses intact.  Regular rhythm MS: Normal AROM mjr joints.  No obvious deformity Abdomen:  Morbidly obese &  Somewhat firm.  Mildy distended.  Nontender.  (That is his baseline).  No evidence of peritonitis.  No incarcerated hernias. GU: Foley in place Rectal: - no active bleeding Ext:  SCDs BLE.  No mjr edema.  No cyanosis Skin: No petechiae / purpura  Past Medical History:  Diagnosis Date  . Arthritis   . Atrial fibrillation ([place])   . CAD (coronary artery disease) 2012   [place]L[facility]  . COPD (chronic obstructive pulmonary disease) ([place])   . GERD (gastroesophageal reflux disease)   . HEART FAILURE, CONGESTIVE UNSPEC 02/23/2007   Qualifier: Diagnosis of  By: Mellody Drown MD, [facility]    . Hyperlipidemia   . Hypertension   . OSA (obstructive sleep apnea) 11/13/2014  . S/P AVR     #25 mm Edwards pericardial valve Bioprosthetic. Dr Cyndia Bent.  . Situational depression    "son passed 11/2013"  . Sleep apnea    occ uses c pap does not know settings   . Stented coronary artery 2013  . TOBACCO DEPENDENCE 07/29/2006   Qualifier: History of  By: Carlena Sax  MD, [place]    . Type II diabetes mellitus ([place])     Past Surgical History:  Procedure Laterality Date  . AORTIC VALVE REPLACEMENT  07/2010   notes 07/28/2010  . CARDIAC CATHETERIZATION  06/2010   Archie Endo 07/01/2010  . CORONARY ANGIOPLASTY WITH STENT PLACEMENT  2006; 10/2006   "2"; 1/notes 10/01/2010  . CORONARY ARTERY BYPASS GRAFT  07/2010   "1"/notes  07/28/2010  . PARTIAL PROCTECTOMY BY TEM N/A 05/26/2016   Procedure: TEM PARTIAL PROCTECTOMY OF RECTAL MASS;  Surgeon: [name], MD;  Location: [facility];  Service: General;  Laterality: N/A;    Social History   Social History  . Marital status: Legally Separated    Spouse name: N/A  . Number of children: 7  . Years of education: 38   Occupational History  . Retired-[employer]  . [employer]    Social History Main Topics  . Smoking status: Former Smoker    Packs/day: 0.50    Years: 46.00    Types: Cigarettes  . Smokeless tobacco: Never Used  . Alcohol use No  . Drug use: No  . Sexual activity: Not on file   Other Topics Concern  . Not on file   Social History Narrative   Health Care POA:    Emergency Contact: Daughter, [name]   End of Life Plan:    Who lives with you: self   Any pets: none   Diet: Patient has a varied diet of protein, starch, and vegetables.   Exercise: Pt has not regular exercise routine.   Seatbelts: Pt reports wearing seatbelt when in vehile.   Nancy Fetter Exposure/Protection:    Hobbies: fishing, tv          Family History  Problem Relation Age of Onset  . Heart disease Mother   . Colon cancer Neg Hx   . Esophageal cancer Neg Hx   . Stomach cancer Neg Hx   . Pancreatic cancer Neg Hx   . Liver disease Neg Hx   . Colon polyps  Neg Hx     Current Facility-Administered Medications  Medication Dose Route Frequency Provider Last Rate Last Dose  . 0.9 %  sodium chloride infusion  250 mL Intravenous PRN [name], MD      . 0.9 %  sodium chloride infusion  250 mL Intravenous PRN [name], MD      . acetaminophen (TYLENOL) tablet 1,000 mg  1,000 mg Oral TID [name], MD   1,000 mg at 05/28/16 2141  . albuterol (PROVENTIL) (2.5 MG/3ML) 0.083% nebulizer solution 3 mL  3 mL Inhalation Q6H PRN [name], MD      . atorvastatin (LIPITOR) tablet 40 mg  40 mg Oral q1800 [name], MD   40 mg at 05/28/16 1837  . benazepril (LOTENSIN) tablet 20 mg  20 mg Oral Daily [name], MD   20 mg at 05/28/16 0947  . chlorhexidine gluconate (MEDLINE KIT) (PERIDEX) 0.12 % solution 15 mL  15 mL Mouth Rinse BID [name], MD   15 mL at 05/28/16 2000  . dexmedetomidine (PRECEDEX) 200 MCG/50ML (4 mcg/mL) infusion  0.4-1.2 mcg/kg/hr Intravenous Titrated Albertha Ghee, MD   Stopped at 05/27/16 0845  . diphenhydrAMINE (BENADRYL) 12.5 MG/5ML elixir 12.5 mg  12.5 mg Oral Q6H PRN [name], MD       Or  . diphenhydrAMINE (BENADRYL) injection 12.5 mg  12.5 mg Intravenous Q6H PRN [name], MD      . enoxaparin (LOVENOX) injection 40 mg  40 mg Subcutaneous Q24H [name], MD   40 mg at 05/28/16 0757  . furosemide (LASIX) tablet 40 mg  40 mg Oral BID [name], MD   40 mg at 05/28/16 1837  . glipiZIDE (GLUCOTROL) tablet 10 mg  10 mg Oral QAC supper [name], MD      . hydrALAZINE (APRESOLINE) injection 10 mg  10 mg  Intravenous Q2H PRN Karie Soda, MD   10 mg at 05/26/16 2144  . hydrocortisone-pramoxine (ANALPRAM-HC) 2.5-1 % rectal cream 1 application  1 application Rectal Q6H PRN Karie Soda, MD      . HYDROmorphone (DILAUDID) injection 0.5-2 mg  0.5-2 mg Intravenous Q2H PRN Karie Soda, MD      . insulin aspart (novoLOG) injection 0-20 Units  0-20 Units Subcutaneous TID WC Karie Soda, MD   3 Units at 05/28/16  0800  . insulin aspart (novoLOG) injection 0-5 Units  0-5 Units Subcutaneous QHS Karie Soda, MD      . lactated ringers bolus 1,000 mL  1,000 mL Intravenous Q8H PRN Karie Soda, MD      . lip balm (CARMEX) ointment 1 application  1 application Topical BID Karie Soda, MD   1 application at 05/28/16 2142  . magic mouthwash  15 mL Oral QID PRN Karie Soda, MD      . MEDLINE mouth rinse  15 mL Mouth Rinse BID Praveen Mannam, MD   15 mL at 05/28/16 2142  . menthol-cetylpyridinium (CEPACOL) lozenge 3 mg  1 lozenge Oral PRN Karie Soda, MD      . methocarbamol (ROBAXIN) tablet 1,000 mg  1,000 mg Oral Q6H PRN Karie Soda, MD      . metoprolol (LOPRESSOR) injection 5 mg  5 mg Intravenous Q6H PRN Karie Soda, MD      . metoprolol succinate (TOPROL-XL) 24 hr tablet 25 mg  25 mg Oral Daily Karie Soda, MD   25 mg at 05/28/16 0947  . metoprolol tartrate (LOPRESSOR) tablet 12.5 mg  12.5 mg Oral Q12H PRN Karie Soda, MD      . mometasone-formoterol [facility]) 100-5 MCG/ACT inhaler 2 puff  2 puff Inhalation BID Karie Soda, MD   2 puff at 05/28/16 [phone]  . multivitamin with minerals tablet 1 tablet  1 tablet Oral QPM Karie Soda, MD   1 tablet at 05/28/16 1837  . nitroGLYCERIN (NITROSTAT) SL tablet 0.4 mg  0.4 mg Sublingual Q5 min PRN Karie Soda, MD      . nystatin (MYCOSTATIN/NYSTOP) topical powder   Topical BID Karie Soda, MD      . ondansetron (ZOFRAN-ODT) disintegrating tablet 4 mg  4 mg Oral Q6H PRN Karie Soda, MD       Or  . ondansetron [facility]) injection 4 mg  4 mg Intravenous Q6H PRN Karie Soda, MD      . oxyCODONE (Oxy IR/ROXICODONE) immediate release tablet 5-10 mg  5-10 mg Oral Q4H PRN Karie Soda, MD      . phenol (CHLORASEPTIC) mouth spray 2 spray  2 spray Mouth/Throat PRN Karie Soda, MD      . polyethylene glycol (MIRALAX / GLYCOLAX) packet 34 g  34 g Oral BID Karie Soda, MD   34 g at 05/28/16 0948  . potassium chloride 20 MEQ/15ML (10%) solution 20 mEq  20 mEq Oral Daily  Karie Soda, MD   20 mEq at 05/28/16 0949  . simethicone (MYLICON) chewable tablet 40 mg  40 mg Oral Q6H PRN Karie Soda, MD      . sodium chloride flush (NS) 0.9 % injection 3 mL  3 mL Intravenous Q12H Karie Soda, MD   3 mL at 05/28/16 2144  . sodium chloride flush (NS) 0.9 % injection 3 mL  3 mL Intravenous PRN Karie Soda, MD      . sodium chloride flush (NS) 0.9 % injection 3 mL  3 mL Intravenous Q12H Karie Soda, MD  3 mL at 05/28/16 2144  . sodium chloride flush (NS) 0.9 % injection 3 mL  3 mL Intravenous PRN [name], MD      . vitamin C (ASCORBIC ACID) tablet 500 mg  500 mg Oral q morning - 10a [name], MD   500 mg at 05/28/16 0948  . Warfarin - Pharmacist Dosing Inpatient   Does not apply [place], RPH      . witch hazel-glycerin (TUCKS) pad 1 application  1 application Topical PRN [name], MD         Allergies  Allergen Reactions  . Pineapple Concentrate Nausea And Vomiting    Signed: Morton Peters, M.D., F.A.C.S. Gastro[facility]ally [facility] [address], [place], [place] [address] [phone] Main / Paging   05/29/2016, 8:16 AM

## 2016-05-29 NOTE — Progress Notes (Signed)
I called daughter [name] to let her know of discharge order. She states lives alone and is not able to care for himself there. Informed her that he is refusing SNF .[name] spoke with her father. He remains firm in his decision to go home. Patient hung up after discussing discharge with daughter.

## 2016-05-29 NOTE — Care Management Note (Signed)
Case Management Note  Patient Details  Name: Matthew Edwards MRN: [id number] Date of Birth: Sep 18, 1940  Subjective/Objective:   Spoke to nsg about d/c-nurse provided me w/dtr's c# [name] [phone] have given the tel# to [facility] rep Dawn-she will contact dtr about her concerns.                 Action/Plan:d/c home w/HHC.   Expected Discharge Date:                  Expected Discharge Plan:  [place]  In-House Referral:     Discharge planning Services  CM Consult  Post Acute Care Choice:  Home Health [facility] HHRN/PT/OT/aide,sw) Choice offered to:  Adult Children  DME Arranged:    DME Agency:     HH Arranged:  RN, PT, OT, Nurse's Aide, Social Work, Theme park manager Therapy HH Agency:  [facility]  Status of Service:  Completed, signed off  If discussed at [employer], dates discussed:    Additional Comments:  Dessa Phi, RN 05/29/2016, 4:48 PM

## 2016-05-30 DIAGNOSIS — I5032 Chronic diastolic (congestive) heart failure: Secondary | ICD-10-CM | POA: Diagnosis not present

## 2016-05-30 DIAGNOSIS — G3184 Mild cognitive impairment, so stated: Secondary | ICD-10-CM | POA: Diagnosis not present

## 2016-05-30 DIAGNOSIS — M48061 Spinal stenosis, lumbar region without neurogenic claudication: Secondary | ICD-10-CM | POA: Diagnosis not present

## 2016-05-30 DIAGNOSIS — I2510 Atherosclerotic heart disease of native coronary artery without angina pectoris: Secondary | ICD-10-CM | POA: Diagnosis not present

## 2016-05-30 DIAGNOSIS — I272 Pulmonary hypertension, unspecified: Secondary | ICD-10-CM | POA: Diagnosis not present

## 2016-05-30 DIAGNOSIS — E1140 Type 2 diabetes mellitus with diabetic neuropathy, unspecified: Secondary | ICD-10-CM | POA: Diagnosis not present

## 2016-05-30 DIAGNOSIS — J449 Chronic obstructive pulmonary disease, unspecified: Secondary | ICD-10-CM | POA: Diagnosis not present

## 2016-05-30 DIAGNOSIS — I110 Hypertensive heart disease with heart failure: Secondary | ICD-10-CM | POA: Diagnosis not present

## 2016-05-30 DIAGNOSIS — I482 Chronic atrial fibrillation: Secondary | ICD-10-CM | POA: Diagnosis not present

## 2016-06-03 ENCOUNTER — Other Ambulatory Visit: Payer: Self-pay

## 2016-06-03 ENCOUNTER — Encounter: Payer: Self-pay | Admitting: Physician Assistant

## 2016-06-03 ENCOUNTER — Ambulatory Visit (INDEPENDENT_AMBULATORY_CARE_PROVIDER_SITE_OTHER): Payer: Commercial Managed Care - HMO | Admitting: Physician Assistant

## 2016-06-03 VITALS — BP 138/71 | HR 94 | Ht 69.0 in | Wt 250.0 lb

## 2016-06-03 DIAGNOSIS — I482 Chronic atrial fibrillation, unspecified: Secondary | ICD-10-CM

## 2016-06-03 DIAGNOSIS — R531 Weakness: Secondary | ICD-10-CM

## 2016-06-03 DIAGNOSIS — Z9989 Dependence on other enabling machines and devices: Secondary | ICD-10-CM

## 2016-06-03 DIAGNOSIS — I25810 Atherosclerosis of coronary artery bypass graft(s) without angina pectoris: Secondary | ICD-10-CM | POA: Diagnosis not present

## 2016-06-03 DIAGNOSIS — I10 Essential (primary) hypertension: Secondary | ICD-10-CM | POA: Diagnosis not present

## 2016-06-03 DIAGNOSIS — G4733 Obstructive sleep apnea (adult) (pediatric): Secondary | ICD-10-CM

## 2016-06-03 DIAGNOSIS — Z79899 Other long term (current) drug therapy: Secondary | ICD-10-CM

## 2016-06-03 DIAGNOSIS — Z952 Presence of prosthetic heart valve: Secondary | ICD-10-CM

## 2016-06-03 DIAGNOSIS — E118 Type 2 diabetes mellitus with unspecified complications: Secondary | ICD-10-CM

## 2016-06-03 LAB — CBC
HCT: 39.9 % (ref 38.5–50.0)
Hemoglobin: 12.8 g/dL — ABNORMAL LOW (ref 13.2–17.1)
MCH: 28.7 pg (ref 27.0–33.0)
MCHC: 32.1 g/dL (ref 32.0–36.0)
MCV: 89.5 fL (ref 80.0–100.0)
MPV: 10.1 fL (ref 7.5–12.5)
Platelets: 210 10*3/uL (ref 140–400)
RBC: 4.46 MIL/uL (ref 4.20–5.80)
RDW: 15.2 % — ABNORMAL HIGH (ref 11.0–15.0)
WBC: 7.2 10*3/uL (ref 3.8–10.8)

## 2016-06-03 MED ORDER — FUROSEMIDE 40 MG PO TABS: 40.0000 mg | ORAL_TABLET | Freq: Every day | ORAL | 3 refills | Status: DC

## 2016-06-03 MED ORDER — NITROGLYCERIN 0.4 MG SL SUBL: 0.4000 mg | SUBLINGUAL_TABLET | SUBLINGUAL | 6 refills | Status: DC | PRN

## 2016-06-03 NOTE — Progress Notes (Signed)
Cardiology Office Note    Date:  06/03/2016   ID:  Matthew Edwards, DOB 08-23-1940, MRN [id number]  PCP:  Donnamae Jude, MD  Cardiologist:  [name] Sleep clinic: Dr. Claiborne Billings   Chief Complaint  Patient presents with  . Follow-up    History of Present Illness:  Matthew Edwards is a 76 y.o. male with PMH of CAD s/p stent to LAD and LCx, chronic atrial fibrillation on Coumadin, COPD, GERD, hypertension, hyperlipidemia, DM II, severe OSA and s/p AVR. In 2012, he underwent aortic valve replacement with #96m Edwards pericardial valve and had LIMA to LAD CABG. Last cardiac catheterization performed in January 2012 prior to CABG and valvular replacement surgery shows patent LAD stent with 40% stenosis proximal to the stent, patent left circumflex stent. He was initially placed on CPAP, this was later transitioned to BiPAP. His BiPAP has been managed by Dr. KClaiborne Billings He presented to the hospital in December 2016 for vertigo and was prescribed meclizine. He was also admitted in April for recurrent falls and weakness concerning for CVA versus spinal etiology. Neurology was consulted at the time, MRI of the brain as well as cervical and lumbar spine was negative except for atrophy and chronic microvascular ischemic changes. Urine drug test and EtOH was negative. TSH and RPR were normal as well. Vitamin B-12 was normal however on the lower side of range, he was given vitamin B-12 injection prior to leaving the hospital. He was seen by family medicine in October 2017 for altered mental status, it was felt that his dizziness is likely related to orthostatic hypotension in the setting of diarrhea.  I last saw the patient on 04/29/2016 for preoperative clearance at the recommendation of Dr. GJohney Maine He had distal rectal polyp that was concerning for high-grade dysplasia. He did have some exertional shortness of breath along with chest discomfort, however this occurs only with strenuous activity. Given his stable  angina, he was cleared for surgery. Since his CHA2DS2-Vasc score was 4, he did not require any bridging with lovenox (which is recommended for pt with CHA2DS2-Vasc score > 4). He underwent the planned procedure on 05/26/2016 with TEM partial proctectomy of rectal mass. Postprocedure, he was slow to recover from anesthesia and was intermittently agitated. Pulmonary critical care service was consulted briefly as he was unable to be extubated in the PACU, fortunately this has quickly resolved. He was later discharged on 05/29/2016.  Patient presents today for cardiology office follow-up after his recent surgery. He still have some baseline dementia. He is accompanied by his daughter who is concerned about his bowel movement. According to his daughter, he was having terrible constipation after recent partial protectectomy. However once he take miralax, he can't stop having diarrhea. When he tried to cut back on miralax, he has recurrent constipation again. He is otherwise stable from cardiology perspective. Due to recent diarrhea, his daughter has cut back his Lasix to 40 mg daily instead of twice a day dosing. He does have 1-2+ pitting edema in the bilateral lower extremity, this seems to be chronic, his weight is unchanged since the surgery. He does have decreased breath sound in bilateral lung bases, which I think is more likely to be atelectasis instead of heart failure. Given his recent diarrhea, I'm okay with him continue on the current dose of 40 mg daily of Lasix. I have instructed her daughter to give him additional dose of Lasix if his weight increased by more than 3 pounds overnight or 5  pounds in a single week. He has also restarted on the Coumadin, he is having it drawn by home health nurse and the result has been sent to his PCP for follow-up and management. Last INR on 12/29 is 1.3, subtherapeutic.The main issue that was brought up today was his persistent fatigue since the recent surgery. I think  surgery and decondition like contributed to this, however since he requires systemic anticoagulation therapy, I will obtain a CBC today to rule out severe anemia. Otherwise he has not had any chest discomfort recently after the surgery. We will hold off on further ischemic workup.   Past Medical History:  Diagnosis Date  . Arthritis   . Atrial fibrillation ([place])   . CAD (coronary artery disease) 2012   [place]-LAD  . COPD (chronic obstructive pulmonary disease) ([place])   . GERD (gastroesophageal reflux disease)   . HEART FAILURE, CONGESTIVE UNSPEC 02/23/2007   Qualifier: Diagnosis of  By: Mellody Drown MD, [facility]    . Hyperlipidemia   . Hypertension   . OSA (obstructive sleep apnea) 11/13/2014  . S/P AVR    #25 mm Edwards pericardial valve Bioprosthetic. Dr Cyndia Bent.  . Situational depression    "son passed 11/2013"  . Sleep apnea    occ uses c pap does not know settings   . Stented coronary artery 2013  . TOBACCO DEPENDENCE 07/29/2006   Qualifier: History of  By: Carlena Sax  MD, [facility]    . Type II diabetes mellitus ([place])     Past Surgical History:  Procedure Laterality Date  . AORTIC VALVE REPLACEMENT  07/2010   notes 07/28/2010  . CARDIAC CATHETERIZATION  06/2010   Archie Endo 07/01/2010  . CORONARY ANGIOPLASTY WITH STENT PLACEMENT  2006; 10/2006   "2"; 1/notes 10/01/2010  . CORONARY ARTERY BYPASS GRAFT  07/2010   "1"/notes 07/28/2010  . PARTIAL PROCTECTOMY BY TEM N/A 05/26/2016   Procedure: TEM PARTIAL PROCTECTOMY OF RECTAL MASS;  Surgeon: [name], MD;  Location: [facility]S;  Service: General;  Laterality: N/A;    Current Medications: Outpatient Medications Prior to Visit  Medication Sig Dispense Refill  . atorvastatin (LIPITOR) 40 MG tablet Take 1 tablet (40 mg total) by mouth daily at 6 PM. 90 tablet 0  . benazepril (LOTENSIN) 20 MG tablet Take 1 tablet (20 mg total) by mouth daily. 90 tablet 0  . Blood Glucose Monitoring Suppl (ACCU-CHEK AVIVA PLUS) W/DEVICE KIT 1 Units by Does not apply route  3 (three) times daily. 1 kit 0  . budesonide-formoterol (SYMBICORT) 80-4.5 MCG/ACT inhaler Inhale 2 puffs into the lungs 2 (two) times daily. 10 Inhaler 11  . Cholecalciferol (VITAMIN D-3 PO) Take 1,000 Units by mouth daily.    Marland Kitchen glipiZIDE (GLUCOTROL) 10 MG tablet Take 1 tablet by mouth at 5 PM with a meal. 90 tablet 0  . glucose blood (ACCU-CHEK AVIVA PLUS) test strip USE TO CHECK BLOOD SUGARS FOUR TIMES DAILY 400 each 3  . metoprolol succinate (TOPROL-XL) 25 MG 24 hr tablet Take 1 tablet (25 mg total) by mouth daily. Reported on 11/28/2015 90 tablet 0  . Multiple Vitamins-Minerals (MULTIVITAMIN WITH MINERALS) tablet Take 1 tablet by mouth every evening.    . nitroGLYCERIN (NITROSTAT) 0.4 MG SL tablet Place 1 tablet (0.4 mg total) under the tongue every 5 (five) minutes as needed. For chest pain 25 tablet 6  . polyethylene glycol powder (GLYCOLAX/MIRALAX) powder Take 17 g by mouth 3 times/day as needed-between meals & bedtime for moderate constipation or severe constipation (Goal is  1-2 BM/day.  Increase to up to 6 doses a day). 500 g 10  . potassium chloride SA (K-DUR,KLOR-CON) 20 MEQ tablet Take 1 tablet (20 mEq total) by mouth daily. 90 tablet 3  . vitamin C (ASCORBIC ACID) 500 MG tablet Take 1 tablet (500 mg total) by mouth every morning. 90 tablet 3  . warfarin (COUMADIN) 5 MG tablet Take 1 tablet (5 mg total) by mouth daily. (Patient taking differently: Take 7.5-10 mg by mouth See admin instructions. 7.5 mg Sun and Fri.  10 mg Mon, Tues, Wed, Thurs and Sat) 90 tablet 0  . furosemide (LASIX) 40 MG tablet Take 1 tablet (40 mg total) by mouth 2 (two) times daily. 90 tablet 3  . albuterol (PROVENTIL HFA;VENTOLIN HFA) 108 (90 Base) MCG/ACT inhaler Inhale 2 puffs into the lungs every 6 (six) hours as needed for wheezing or shortness of breath. (Patient not taking: Reported on 06/03/2016) 1 Inhaler 3  . nitroGLYCERIN (NITROSTAT) 0.4 MG SL tablet Place 1 tablet (0.4 mg total) under the tongue every 5  (five) minutes as needed. For chest pain 25 tablet 6  . oxyCODONE (OXY IR/ROXICODONE) 5 MG immediate release tablet Take 1-2 tablets (5-10 mg total) by mouth every 6 (six) hours as needed for moderate pain, severe pain or breakthrough pain. (Patient not taking: Reported on 06/03/2016) 40 tablet 0   Facility-Administered Medications Prior to Visit  Medication Dose Route Frequency Provider Last Rate Last Dose  . 0.9 %  sodium chloride infusion  500 mL Intravenous Continuous Nelida Meuse III, MD         Allergies:   Pineapple concentrate   Social History   Social History  . Marital status: Legally Separated    Spouse name: N/A  . Number of children: 7  . Years of education: 19   Occupational History  . Retired-[employer]  . [employer]    Social History Main Topics  . Smoking status: Former Smoker    Packs/day: 0.50    Years: 46.00    Types: Cigarettes  . Smokeless tobacco: Never Used  . Alcohol use No  . Drug use: No  . Sexual activity: Not Asked   Other Topics Concern  . None   Social History Narrative   Health Care POA:    Emergency Contact: Daughter, [name]   End of Life Plan:    Who lives with you: self   Any pets: none   Diet: Patient has a varied diet of protein, starch, and vegetables.   Exercise: Pt has not regular exercise routine.   Seatbelts: Pt reports wearing seatbelt when in vehile.   Nancy Fetter Exposure/Protection:    Hobbies: fishing, tv           Family History:  The patient's family history includes Heart disease in his mother.   ROS:   Please see the history of present illness.    ROS All other systems reviewed and are negative.   PHYSICAL EXAM:   VS:  BP 138/71 (BP Location: Right Arm, Patient Position: Sitting, Cuff Size: Normal)   Pulse 94   Ht '5\' 9"'$  (1.753 m)   Wt 250 lb (113.4 kg)   SpO2 94%   BMI 36.92 kg/m    GEN: Well nourished, well developed, in no acute distress  HEENT: normal  Neck: no JVD, carotid bruits, or  masses Cardiac: irregularly irregular; no murmurs, rubs, or gallops. 1+ edema  Respiratory:  clear to auscultation bilaterally, normal work of breathing GI:  soft, nontender, nondistended, + BS MS: no deformity or atrophy  Skin: warm and dry, no rash Neuro:  Alert and Oriented x 3, Strength and sensation are intact Psych: euthymic mood, full affect  Wt Readings from Last 3 Encounters:  06/03/16 250 lb (113.4 kg)  05/26/16 248 lb (112.5 kg)  05/21/16 248 lb (112.5 kg)      Studies/Labs Reviewed:   EKG:  EKG is not ordered today.    Recent Labs: 09/19/2015: TSH 1.014 09/24/2015: ALT 26 05/21/2016: Hemoglobin 14.8; Platelets 138 05/27/2016: BUN 14; Sodium 135 05/28/2016: Creatinine, Ser 1.26; Magnesium 2.1; Potassium 4.2   Lipid Panel    Component Value Date/Time   CHOL 145 07/25/2015 1558   TRIG 181 (H) 07/25/2015 1558   HDL 39 (L) 07/25/2015 1558   CHOLHDL 3.7 07/25/2015 1558   VLDL 36 (H) 07/25/2015 1558   LDLCALC 70 07/25/2015 1558   LDLDIRECT 81 12/23/2012 1638    Additional studies/ records that were reviewed today include:  Cath 11/15/2006 FINAL IMPRESSIONS: 1. Multivessel coronary artery disease with patent stents in LAD and circumflex. 2. New de novo lesion in the proximal circumflex obtuse marginal branch of 95%. 3. Successful direct stenting of the proximal obtuse marginal branch #1 with a non drug coated stent. 4. Aortic stenosis with a transvalvular gradient of 47 mm. 5. Hypertrophic profile of left ventricle with ejection fraction of 75- 85%.   Cath 06/30/2010 SELECTIVE CORONARY ANGIOGRAPHY: 1. Left main normal. 2. LAD; the mid-LAD stent was widely patent. There was a 40% stenosis just proximal to the stent. 3. Left circumflex; the mid AV groove circumflex stent was widely patent. 4. Right coronary; this was a dominant vessel and was free of significant disease. 5. Supravalvular aortography; supravalvar  aortogram was performed using 20 mL of Visipaque dye at 20 mL per second. There was no aortic insufficiency noted. The supravalvular ascending thoracic aorta was not aneurysmal.    ASSESSMENT:    1. Weakness   2. Medication management   3. Essential hypertension   4. Coronary artery disease involving coronary bypass graft of native heart without angina pectoris   5. Chronic atrial fibrillation (HCC)   6. Controlled type 2 diabetes mellitus with complication, without long-term current use of insulin ([place])   7. S/P AVR (aortic valve replacement)   8. OSA on CPAP      PLAN:  In order of problems listed above:  1. Fatigue: He has been having worsening fatigue recently after surgery. Per daughter he is not doing much and is largely sedentary. I think some of his fatigue is related to deconditioning and recent surgery. I will obtain a CBC today to rule out any significant anemia.  2. CAD s/p CABG: Last cardiac catheterization in 2012, stable anatomy at the time with patent stent in left circumflex and LAD. Underwent LIMA to LAD bypass surgery during valvular surgery. He did have occasional chest discomfort, however this is quite rare and appears to be stable at this time. No further ischemic workup is needed.  - He does have stable 1+ lower extremity edema on physical exam, due to recent diarrhea after surgery, his daughter's concern about dehydration, therefore His Lasix down to 40 mg daily. His weight surprisingly has been doing quite well on the current dose of Lasix. We'll continue off 40 mg daily of Lasix, however I also instructed the patient's daughter that if his weight increased by more than 3 pounds overnight or 5 pounds in a single week, we will  restart on the twice a day dosing of Lasix.  3. Hypertension: Blood pressure stable today  4. Hyperlipidemia: On Lipitor daily. He is due for another lipid panel in February 2018  5. DM II: On glipizide   6. s/p aortic  valve replacement: Last echo March 2016, EF 50-55%, stable anatomy with bioprosthetic aortic valve. He is due for repeat echocardiogram around March 2018.    Medication Adjustments/Labs and Tests Ordered: Current medicines are reviewed at length with the patient today.  Concerns regarding medicines are outlined above.  Medication changes, Labs and Tests ordered today are listed in the Patient Instructions below. Patient Instructions  Medication Instructions:  Continue Lasix '40mg'$  once daily.   Labwork: Have blood work completed today (CBC).   Testing/Procedures: NONE  Follow-Up: Follow up with [name] in 2 months.     If you need a refill on your cardiac medications before your next appointment, please call your pharmacy.      Hilbert Corrigan, [place]  06/03/2016 12:14 PM    [facility] [address], [place], [place]  [address] Phone: [phone]; Fax: [phone]

## 2016-06-03 NOTE — Patient Instructions (Signed)
Medication Instructions:  Continue Lasix 40mg  once daily.   Labwork: Have blood work completed today (CBC).   Testing/Procedures: NONE  Follow-Up: Follow up with [name] in 2 months.     If you need a refill on your cardiac medications before your next appointment, please call your pharmacy.

## 2016-06-04 ENCOUNTER — Ambulatory Visit (INDEPENDENT_AMBULATORY_CARE_PROVIDER_SITE_OTHER): Payer: Commercial Managed Care - HMO | Admitting: Family Medicine

## 2016-06-04 VITALS — BP 120/72 | HR 134 | Temp 98.2°F | Ht 69.0 in | Wt 245.0 lb

## 2016-06-04 DIAGNOSIS — K5900 Constipation, unspecified: Secondary | ICD-10-CM

## 2016-06-04 DIAGNOSIS — I10 Essential (primary) hypertension: Secondary | ICD-10-CM | POA: Diagnosis not present

## 2016-06-04 DIAGNOSIS — E118 Type 2 diabetes mellitus with unspecified complications: Secondary | ICD-10-CM

## 2016-06-04 DIAGNOSIS — J42 Unspecified chronic bronchitis: Secondary | ICD-10-CM | POA: Diagnosis not present

## 2016-06-04 DIAGNOSIS — R269 Unspecified abnormalities of gait and mobility: Secondary | ICD-10-CM | POA: Diagnosis not present

## 2016-06-04 DIAGNOSIS — I482 Chronic atrial fibrillation, unspecified: Secondary | ICD-10-CM

## 2016-06-04 DIAGNOSIS — E1142 Type 2 diabetes mellitus with diabetic polyneuropathy: Secondary | ICD-10-CM

## 2016-06-04 LAB — POCT INR: INR: 3.1

## 2016-06-04 MED ORDER — GLUCERNA SHAKE PO LIQD: 237.0000 mL | Freq: Three times a day (TID) | ORAL | 3 refills | Status: DC

## 2016-06-04 MED ORDER — BUDESONIDE-FORMOTEROL FUMARATE 80-4.5 MCG/ACT IN AERO: 2.0000 | INHALATION_SPRAY | Freq: Two times a day (BID) | RESPIRATORY_TRACT | 11 refills | Status: DC

## 2016-06-04 MED ORDER — WARFARIN SODIUM 5 MG PO TABS: 7.5000 mg | ORAL_TABLET | ORAL | 2 refills | Status: DC

## 2016-06-04 NOTE — Assessment & Plan Note (Addendum)
Rx for Glucerna given, as he lives alone and meal planning is difficult. Continue Glipizide. Podiatry and optho referrals placed.

## 2016-06-04 NOTE — Assessment & Plan Note (Signed)
We will begin managing his Coumadin--INR 3.1 today--repeat in 2-3 wks.

## 2016-06-04 NOTE — Assessment & Plan Note (Addendum)
-   Continue Symbicort  

## 2016-06-04 NOTE — Assessment & Plan Note (Signed)
To see Dr. Valentina Lucks for polypharmacy and to see if any of his current meds are worsening this symptom. Was doing 3x/day Miralax which over-corrected--continue to use qod to qd to manage symptoms.

## 2016-06-04 NOTE — Assessment & Plan Note (Signed)
Given Gait issues, we will have PT come to the house to help with walker use.

## 2016-06-04 NOTE — Assessment & Plan Note (Signed)
BP is well controlled, continue Lotensin and Toprol

## 2016-06-04 NOTE — Progress Notes (Signed)
   Subjective:    Patient ID: Matthew Edwards is a 76 y.o. male presenting with Follow-up  on 06/04/2016  HPI: Had recent surgery due to abnormal polyp. It was benign. Had some increased confusion following anesthesia. Reports increased anxiety. Has now had some diarrhea due to Miralax, has been needed Pull-ups. He has stopped that and he has had some more formed stool. Wants to continue his [facility]--needs home PT, his gait seems unsteady. Needs walker training. Would like to have his INR drawn there. Needs some protein shakes, due to meal prep which is necessary.  Review of Systems  Constitutional: Negative for chills and fever.  Respiratory: Negative for shortness of breath.   Cardiovascular: Negative for leg swelling.  Gastrointestinal: Negative for abdominal pain, nausea and vomiting.      Objective:    BP 120/72   Pulse (!) 134   Temp 98.2 F (36.8 C) (Oral)   Ht 5\' 9"  (1.753 m)   Wt 245 lb (111.1 kg)   SpO2 92%   BMI 36.18 kg/m  Physical Exam  Constitutional: He appears well-developed and well-nourished. No distress.  HENT:  Head: Normocephalic and atraumatic.  Eyes: No scleral icterus.  Neck: Neck supple.  Cardiovascular: Normal rate.   Pulmonary/Chest: Effort normal.  Abdominal: Soft.  Musculoskeletal: He exhibits no edema.  Neurological: He is alert.  Skin: Skin is warm.  Psychiatric: He has a normal mood and affect.  Vitals reviewed.       Assessment & Plan:   Problem List Items Addressed This Visit      Unprioritized   Essential hypertension - Primary    BP is well controlled, continue Lotensin and Toprol      Relevant Medications   warfarin (COUMADIN) 5 MG tablet   COPD (chronic obstructive pulmonary disease) (HCC)    Continue Symbicort      Relevant Medications   budesonide-formoterol (SYMBICORT) 80-4.5 MCG/ACT inhaler   Diabetic neuropathy (HCC)   Chronic atrial fibrillation (HCC)    We will begin managing his Coumadin--INR 3.1  today--repeat in 2-3 wks.      Relevant Medications   warfarin (COUMADIN) 5 MG tablet   Other Relevant Orders   Ambulatory referral to Home Health   POCT INR (Completed)   Constipation    To see Dr. Valentina Lucks for polypharmacy and to see if any of his current meds are worsening this symptom. Was doing 3x/day Miralax which over-corrected--continue to use qod to qd to manage symptoms.      Type 2 diabetes mellitus with complication, without long-term current use of insulin (HCC)    Rx for Glucerna given, as he lives alone and meal planning is difficult. Continue Glipizide. Podiatry and optho referrals placed.      Relevant Orders   Ambulatory referral to Podiatry   Ambulatory referral to Ophthalmology   Abnormality of gait    Given Gait issues, we will have PT come to the house to help with walker use.      Relevant Orders   Ambulatory referral to [facility]      Total face-to-face time with patient: 25 minutes. Over 50% of encounter was spent on counseling and coordination of care. Return in about 3 months (around 09/02/2016).  Donnamae Jude 06/04/2016 11:27 AM

## 2016-06-04 NOTE — Patient Instructions (Signed)
Fall Prevention in the Home Introduction Falls can cause injuries. They can happen to people of all ages. There are many things you can do to make your home safe and to help prevent falls. What can I do on the outside of my home?  Regularly fix the edges of walkways and driveways and fix any cracks.  Remove anything that might make you trip as you walk through a door, such as a raised step or threshold.  Trim any bushes or trees on the path to your home.  Use bright outdoor lighting.  Clear any walking paths of anything that might make someone trip, such as rocks or tools.  Regularly check to see if handrails are loose or broken. Make sure that both sides of any steps have handrails.  Any raised decks and porches should have guardrails on the edges.  Have any leaves, snow, or ice cleared regularly.  Use sand or salt on walking paths during winter.  Clean up any spills in your garage right away. This includes oil or grease spills. What can I do in the bathroom?  Use night lights.  Install grab bars by the toilet and in the tub and shower. Do not use towel bars as grab bars.  Use non-skid mats or decals in the tub or shower.  If you need to sit down in the shower, use a plastic, non-slip stool.  Keep the floor dry. Clean up any water that spills on the floor as soon as it happens.  Remove soap buildup in the tub or shower regularly.  Attach bath mats securely with double-sided non-slip rug tape.  Do not have throw rugs and other things on the floor that can make you trip. What can I do in the bedroom?  Use night lights.  Make sure that you have a light by your bed that is easy to reach.  Do not use any sheets or blankets that are too big for your bed. They should not hang down onto the floor.  Have a firm chair that has side arms. You can use this for support while you get dressed.  Do not have throw rugs and other things on the floor that can make you trip. What can  I do in the kitchen?  Clean up any spills right away.  Avoid walking on wet floors.  Keep items that you use a lot in easy-to-reach places.  If you need to reach something above you, use a strong step stool that has a grab bar.  Keep electrical cords out of the way.  Do not use floor polish or wax that makes floors slippery. If you must use wax, use non-skid floor wax.  Do not have throw rugs and other things on the floor that can make you trip. What can I do with my stairs?  Do not leave any items on the stairs.  Make sure that there are handrails on both sides of the stairs and use them. Fix handrails that are broken or loose. Make sure that handrails are as long as the stairways.  Check any carpeting to make sure that it is firmly attached to the stairs. Fix any carpet that is loose or worn.  Avoid having throw rugs at the top or bottom of the stairs. If you do have throw rugs, attach them to the floor with carpet tape.  Make sure that you have a light switch at the top of the stairs and the bottom of the stairs. If you   do not have them, ask someone to add them for you. What else can I do to help prevent falls?  Wear shoes that:  Do not have high heels.  Have rubber bottoms.  Are comfortable and fit you well.  Are closed at the toe. Do not wear sandals.  If you use a stepladder:  Make sure that it is fully opened. Do not climb a closed stepladder.  Make sure that both sides of the stepladder are locked into place.  Ask someone to hold it for you, if possible.  Clearly mark and make sure that you can see:  Any grab bars or handrails.  First and last steps.  Where the edge of each step is.  Use tools that help you move around (mobility aids) if they are needed. These include:  Canes.  Walkers.  Scooters.  Crutches.  Turn on the lights when you go into a dark area. Replace any light bulbs as soon as they burn out.  Set up your furniture so you have a  clear path. Avoid moving your furniture around.  If any of your floors are uneven, fix them.  If there are any pets around you, be aware of where they are.  Review your medicines with your doctor. Some medicines can make you feel dizzy. This can increase your chance of falling. Ask your doctor what other things that you can do to help prevent falls. This information is not intended to replace advice given to you by your health care provider. Make sure you discuss any questions you have with your health care provider. Document Released: 03/14/2009 Document Revised: 10/24/2015 Document Reviewed: 06/22/2014  2017 [employer] Vitamin K Foods and Warfarin Warfarin is a blood thinner (anticoagulant). Anticoagulant medicines help prevent the formation of blood clots. These medicines work by decreasing the activity of vitamin K, which promotes normal blood clotting. When you take warfarin, problems can occur from suddenly increasing or decreasing the amount of vitamin K that you eat from one day to the next. Problems may include:  Blood clots.  Bleeding. What general guidelines do I need to follow? To avoid problems when taking warfarin:  Eat a balanced diet that includes:  Fresh fruits and vegetables.  Whole grains.  Low-fat dairy products.  Lean proteins, such as fish, eggs, and lean cuts of meat.  Keep your intake of vitamin K consistent from day to day. To do this:  Avoid eating large amounts of vitamin K one day and low amounts of vitamin K the next day.  If you take a multivitamin that contains vitamin K, be sure to take it every day.  Know which foods contain vitamin K. Use the lists below to understand serving sizes and the amount of vitamin K in one serving.  Avoid major changes in your diet. If you are going to change your diet, talk with your health care provider before making changes.  Work with a Financial planner (dietitian) to develop a meal plan that works best for  you. High vitamin K foods Foods that are high in vitamin K contain more than 100 mcg (micrograms) per serving. These include:  Broccoli (cooked) -  cup has 110 mcg.  Brussels sprouts (cooked) -  cup has 109 mcg.  Greens, beet (cooked) -  cup has 350 mcg.  Greens, collard (cooked) -  cup has 418 mcg.  Greens, turnip (cooked) -  cup has 265 mcg.  Green onions or scallions -  cup has 105 mcg.  Irene Limbo (  fresh or frozen) -  cup has 531 mcg.  Parsley (raw) - 10 sprigs has 164 mcg.  Spinach (cooked) -  cup has 444 mcg.  Swiss chard (cooked) -  cup has 287 mcg. Moderate vitamin K foods Foods that have a moderate amount of vitamin K contain 25-100 mcg per serving. These include:  Asparagus (cooked) - 5 spears have 38 mcg.  Black-eyed peas (dried) -  cup has 32 mcg.  Cabbage (cooked) -  cup has 37 mcg.  Kiwi fruit - 1 medium has 31 mcg.  Lettuce - 1 cup has 57-63 mcg.  Okra (frozen) -  cup has 44 mcg.  Prunes (dried) - 5 prunes have 25 mcg.  Watercress (raw) - 1 cup has 85 mcg. Low vitamin K foods Foods low in vitamin K contain less than 25 mcg per serving. These include:  Artichoke - 1 medium has 18 mcg.  Avocado - 1 oz. has 6 mcg.  Blueberries -  cup has 14 mcg.  Cabbage (raw) -  cup has 21 mcg.  Carrots (cooked) -  cup has 11 mcg.  Cauliflower (raw) -  cup has 11 mcg.  Cucumber with peel (raw) -  cup has 9 mcg.  Grapes -  cup has 12 mcg.  Mango - 1 medium has 9 mcg.  Nuts - 1 oz. has 15 mcg.  Pear - 1 medium has 8 mcg.  Peas (cooked) -  cup has 19 mcg.  Pickles - 1 spear has 14 mcg.  Pumpkin seeds - 1 oz. has 13 mcg.  Sauerkraut (canned) -  cup has 16 mcg.  Soybeans (cooked) -  cup has 16 mcg.  Tomato (raw) - 1 medium has 10 mcg.  Tomato sauce -  cup has 17 mcg. Vitamin K-free foods If a food contain less than 5 mcg per serving, it is considered to have no vitamin K. These foods include:  Bread and cereal  products.  Cheese.  Eggs.  Fish and shellfish.  Meat and poultry.  Milk and dairy products.  Sunflower seeds. Actual amounts of vitamin K in foods may be different depending on processing. Talk with your dietitian about what foods you can eat and what foods you should avoid. This information is not intended to replace advice given to you by your health care provider. Make sure you discuss any questions you have with your health care provider. Document Released: 03/15/2009 Document Revised: 12/08/2015 Document Reviewed: 08/21/2015 Elsevier Interactive Patient Education  2017 [employer].

## 2016-06-05 DIAGNOSIS — I110 Hypertensive heart disease with heart failure: Secondary | ICD-10-CM | POA: Diagnosis not present

## 2016-06-05 DIAGNOSIS — I5032 Chronic diastolic (congestive) heart failure: Secondary | ICD-10-CM | POA: Diagnosis not present

## 2016-06-05 DIAGNOSIS — I482 Chronic atrial fibrillation: Secondary | ICD-10-CM | POA: Diagnosis not present

## 2016-06-05 DIAGNOSIS — I272 Pulmonary hypertension, unspecified: Secondary | ICD-10-CM | POA: Diagnosis not present

## 2016-06-05 DIAGNOSIS — I2510 Atherosclerotic heart disease of native coronary artery without angina pectoris: Secondary | ICD-10-CM | POA: Diagnosis not present

## 2016-06-05 DIAGNOSIS — E1140 Type 2 diabetes mellitus with diabetic neuropathy, unspecified: Secondary | ICD-10-CM | POA: Diagnosis not present

## 2016-06-05 DIAGNOSIS — J449 Chronic obstructive pulmonary disease, unspecified: Secondary | ICD-10-CM | POA: Diagnosis not present

## 2016-06-05 DIAGNOSIS — M48061 Spinal stenosis, lumbar region without neurogenic claudication: Secondary | ICD-10-CM | POA: Diagnosis not present

## 2016-06-05 DIAGNOSIS — G3184 Mild cognitive impairment, so stated: Secondary | ICD-10-CM | POA: Diagnosis not present

## 2016-06-10 DIAGNOSIS — G3184 Mild cognitive impairment, so stated: Secondary | ICD-10-CM | POA: Diagnosis not present

## 2016-06-10 DIAGNOSIS — J449 Chronic obstructive pulmonary disease, unspecified: Secondary | ICD-10-CM | POA: Diagnosis not present

## 2016-06-10 DIAGNOSIS — I482 Chronic atrial fibrillation: Secondary | ICD-10-CM | POA: Diagnosis not present

## 2016-06-10 DIAGNOSIS — M48061 Spinal stenosis, lumbar region without neurogenic claudication: Secondary | ICD-10-CM | POA: Diagnosis not present

## 2016-06-10 DIAGNOSIS — I5032 Chronic diastolic (congestive) heart failure: Secondary | ICD-10-CM | POA: Diagnosis not present

## 2016-06-10 DIAGNOSIS — I272 Pulmonary hypertension, unspecified: Secondary | ICD-10-CM | POA: Diagnosis not present

## 2016-06-10 DIAGNOSIS — I2510 Atherosclerotic heart disease of native coronary artery without angina pectoris: Secondary | ICD-10-CM | POA: Diagnosis not present

## 2016-06-10 DIAGNOSIS — E1140 Type 2 diabetes mellitus with diabetic neuropathy, unspecified: Secondary | ICD-10-CM | POA: Diagnosis not present

## 2016-06-10 DIAGNOSIS — I110 Hypertensive heart disease with heart failure: Secondary | ICD-10-CM | POA: Diagnosis not present

## 2016-06-11 ENCOUNTER — Encounter: Payer: Self-pay | Admitting: *Deleted

## 2016-06-12 DIAGNOSIS — M48061 Spinal stenosis, lumbar region without neurogenic claudication: Secondary | ICD-10-CM | POA: Diagnosis not present

## 2016-06-12 DIAGNOSIS — J449 Chronic obstructive pulmonary disease, unspecified: Secondary | ICD-10-CM | POA: Diagnosis not present

## 2016-06-12 DIAGNOSIS — G3184 Mild cognitive impairment, so stated: Secondary | ICD-10-CM | POA: Diagnosis not present

## 2016-06-12 DIAGNOSIS — I110 Hypertensive heart disease with heart failure: Secondary | ICD-10-CM | POA: Diagnosis not present

## 2016-06-12 DIAGNOSIS — E1140 Type 2 diabetes mellitus with diabetic neuropathy, unspecified: Secondary | ICD-10-CM | POA: Diagnosis not present

## 2016-06-12 DIAGNOSIS — I5032 Chronic diastolic (congestive) heart failure: Secondary | ICD-10-CM | POA: Diagnosis not present

## 2016-06-12 DIAGNOSIS — I482 Chronic atrial fibrillation: Secondary | ICD-10-CM | POA: Diagnosis not present

## 2016-06-12 DIAGNOSIS — I272 Pulmonary hypertension, unspecified: Secondary | ICD-10-CM | POA: Diagnosis not present

## 2016-06-12 DIAGNOSIS — I2510 Atherosclerotic heart disease of native coronary artery without angina pectoris: Secondary | ICD-10-CM | POA: Diagnosis not present

## 2016-06-16 ENCOUNTER — Ambulatory Visit: Payer: Commercial Managed Care - HMO

## 2016-06-17 ENCOUNTER — Ambulatory Visit: Payer: Commercial Managed Care - HMO | Admitting: Family Medicine

## 2016-06-22 DIAGNOSIS — M48061 Spinal stenosis, lumbar region without neurogenic claudication: Secondary | ICD-10-CM | POA: Diagnosis not present

## 2016-06-22 DIAGNOSIS — I110 Hypertensive heart disease with heart failure: Secondary | ICD-10-CM | POA: Diagnosis not present

## 2016-06-22 DIAGNOSIS — I2510 Atherosclerotic heart disease of native coronary artery without angina pectoris: Secondary | ICD-10-CM | POA: Diagnosis not present

## 2016-06-22 DIAGNOSIS — I272 Pulmonary hypertension, unspecified: Secondary | ICD-10-CM | POA: Diagnosis not present

## 2016-06-22 DIAGNOSIS — E1140 Type 2 diabetes mellitus with diabetic neuropathy, unspecified: Secondary | ICD-10-CM | POA: Diagnosis not present

## 2016-06-22 DIAGNOSIS — I5032 Chronic diastolic (congestive) heart failure: Secondary | ICD-10-CM | POA: Diagnosis not present

## 2016-06-22 DIAGNOSIS — G3184 Mild cognitive impairment, so stated: Secondary | ICD-10-CM | POA: Diagnosis not present

## 2016-06-22 DIAGNOSIS — I482 Chronic atrial fibrillation: Secondary | ICD-10-CM | POA: Diagnosis not present

## 2016-06-22 DIAGNOSIS — J449 Chronic obstructive pulmonary disease, unspecified: Secondary | ICD-10-CM | POA: Diagnosis not present

## 2016-07-01 DIAGNOSIS — I482 Chronic atrial fibrillation: Secondary | ICD-10-CM | POA: Diagnosis not present

## 2016-07-01 DIAGNOSIS — I2510 Atherosclerotic heart disease of native coronary artery without angina pectoris: Secondary | ICD-10-CM | POA: Diagnosis not present

## 2016-07-01 DIAGNOSIS — I110 Hypertensive heart disease with heart failure: Secondary | ICD-10-CM | POA: Diagnosis not present

## 2016-07-01 DIAGNOSIS — M48061 Spinal stenosis, lumbar region without neurogenic claudication: Secondary | ICD-10-CM | POA: Diagnosis not present

## 2016-07-01 DIAGNOSIS — G3184 Mild cognitive impairment, so stated: Secondary | ICD-10-CM | POA: Diagnosis not present

## 2016-07-01 DIAGNOSIS — E1140 Type 2 diabetes mellitus with diabetic neuropathy, unspecified: Secondary | ICD-10-CM | POA: Diagnosis not present

## 2016-07-01 DIAGNOSIS — I5032 Chronic diastolic (congestive) heart failure: Secondary | ICD-10-CM | POA: Diagnosis not present

## 2016-07-01 DIAGNOSIS — Z483 Aftercare following surgery for neoplasm: Secondary | ICD-10-CM | POA: Diagnosis not present

## 2016-07-01 DIAGNOSIS — J449 Chronic obstructive pulmonary disease, unspecified: Secondary | ICD-10-CM | POA: Diagnosis not present

## 2016-07-03 DIAGNOSIS — I482 Chronic atrial fibrillation: Secondary | ICD-10-CM | POA: Diagnosis not present

## 2016-07-03 DIAGNOSIS — I2510 Atherosclerotic heart disease of native coronary artery without angina pectoris: Secondary | ICD-10-CM | POA: Diagnosis not present

## 2016-07-03 DIAGNOSIS — M48061 Spinal stenosis, lumbar region without neurogenic claudication: Secondary | ICD-10-CM | POA: Diagnosis not present

## 2016-07-03 DIAGNOSIS — J449 Chronic obstructive pulmonary disease, unspecified: Secondary | ICD-10-CM | POA: Diagnosis not present

## 2016-07-03 DIAGNOSIS — G3184 Mild cognitive impairment, so stated: Secondary | ICD-10-CM | POA: Diagnosis not present

## 2016-07-03 DIAGNOSIS — Z483 Aftercare following surgery for neoplasm: Secondary | ICD-10-CM | POA: Diagnosis not present

## 2016-07-03 DIAGNOSIS — I5032 Chronic diastolic (congestive) heart failure: Secondary | ICD-10-CM | POA: Diagnosis not present

## 2016-07-03 DIAGNOSIS — I110 Hypertensive heart disease with heart failure: Secondary | ICD-10-CM | POA: Diagnosis not present

## 2016-07-03 DIAGNOSIS — E1140 Type 2 diabetes mellitus with diabetic neuropathy, unspecified: Secondary | ICD-10-CM | POA: Diagnosis not present

## 2016-07-07 ENCOUNTER — Ambulatory Visit (INDEPENDENT_AMBULATORY_CARE_PROVIDER_SITE_OTHER): Payer: Medicare HMO | Admitting: Sports Medicine

## 2016-07-07 ENCOUNTER — Ambulatory Visit (INDEPENDENT_AMBULATORY_CARE_PROVIDER_SITE_OTHER): Payer: Medicare HMO | Admitting: *Deleted

## 2016-07-07 DIAGNOSIS — M79675 Pain in left toe(s): Secondary | ICD-10-CM

## 2016-07-07 DIAGNOSIS — M2041 Other hammer toe(s) (acquired), right foot: Secondary | ICD-10-CM

## 2016-07-07 DIAGNOSIS — E1142 Type 2 diabetes mellitus with diabetic polyneuropathy: Secondary | ICD-10-CM | POA: Diagnosis not present

## 2016-07-07 DIAGNOSIS — M2042 Other hammer toe(s) (acquired), left foot: Secondary | ICD-10-CM | POA: Diagnosis not present

## 2016-07-07 DIAGNOSIS — L84 Corns and callosities: Secondary | ICD-10-CM

## 2016-07-07 DIAGNOSIS — B351 Tinea unguium: Secondary | ICD-10-CM | POA: Diagnosis not present

## 2016-07-07 DIAGNOSIS — I359 Nonrheumatic aortic valve disorder, unspecified: Secondary | ICD-10-CM | POA: Diagnosis not present

## 2016-07-07 DIAGNOSIS — M79674 Pain in right toe(s): Secondary | ICD-10-CM

## 2016-07-07 LAB — POCT INR: INR: 4.4

## 2016-07-07 NOTE — Patient Instructions (Signed)

## 2016-07-07 NOTE — Progress Notes (Signed)
Subjective: Matthew Edwards is a 76 y.o. male patient with history of diabetes who presents to office today complaining of long, painful nails  while ambulating in shoes; unable to trim. Patient states that the glucose reading yesterday morning was 126 mg/dl. Patient denies any new changes in medication or new problems. Patient denies any new cramping, numbness, burning or tingling in the legs.  Patient is assisted by daughter and want diabetic shoes.   Patient Active Problem List   Diagnosis Date Noted  . Adenomatous rectal polyp with high grade dysplasia s/p TEM resection 05/26/2016 05/26/2016  . Spinal stenosis of lumbar region 11/28/2015  . Abnormality of gait   . Lower extremity weakness   . Type 2 diabetes mellitus with complication, without long-term current use of insulin ([place])   . Weakness 09/18/2015  . Constipation 01/28/2015  . OSA (obstructive sleep apnea) 11/13/2014  . Pulmonary HTN 08/24/2014  . Chronic anticoagulation   . Chronic atrial fibrillation ([place])   . Acute on chronic congestive heart failure with left ventricular diastolic dysfunction ([place]) 08/02/2014  . Cognitive impairment 07/02/2014  . Altered mental status 06/28/2014  . Abdominal distention 06/28/2014  . Unspecified constipation 12/13/2013  . S/P AVR 12/29/2012  . Diabetic neuropathy ([place]) 11/25/2011  . Excessive tearing 10/06/2011  . BENIGN POSITIONAL VERTIGO 06/14/2010  . Dizziness 06/14/2010  . Vitamin D deficiency 02/21/2010  . Primary hyperparathyroidism ([place]) 06/04/2009  . Hypercalcemia 05/29/2009  . Essential hypertension 02/23/2007  . DEPRESSION 12/15/2006  . Coronary atherosclerosis 12/15/2006  . GERD 12/15/2006  . COPD (chronic obstructive pulmonary disease) ([place]) 12/10/2006  . HLD (hyperlipidemia) 07/29/2006  . Alcohol abuse 07/29/2006  . Aortic valve disorder 07/29/2006   Current Outpatient Prescriptions on File Prior to Visit  Medication Sig Dispense Refill  . atorvastatin (LIPITOR)  40 MG tablet Take 1 tablet (40 mg total) by mouth daily at 6 PM. 90 tablet 0  . benazepril (LOTENSIN) 20 MG tablet Take 1 tablet (20 mg total) by mouth daily. 90 tablet 0  . Blood Glucose Monitoring Suppl (ACCU-CHEK AVIVA PLUS) W/DEVICE KIT 1 Units by Does not apply route 3 (three) times daily. 1 kit 0  . budesonide-formoterol (SYMBICORT) 80-4.5 MCG/ACT inhaler Inhale 2 puffs into the lungs 2 (two) times daily. 10 Inhaler 11  . Cholecalciferol (VITAMIN D-3 PO) Take 1,000 Units by mouth daily.    . feeding supplement, GLUCERNA SHAKE, (GLUCERNA SHAKE) LIQD Take 237 mLs by mouth 3 (three) times daily between meals. 90 Can 3  . furosemide (LASIX) 40 MG tablet Take 1 tablet (40 mg total) by mouth daily. 90 tablet 3  . glipiZIDE (GLUCOTROL) 10 MG tablet Take 1 tablet by mouth at 5 PM with a meal. 90 tablet 0  . glucose blood (ACCU-CHEK AVIVA PLUS) test strip USE TO CHECK BLOOD SUGARS FOUR TIMES DAILY 400 each 3  . metoprolol succinate (TOPROL-XL) 25 MG 24 hr tablet Take 1 tablet (25 mg total) by mouth daily. Reported on 11/28/2015 90 tablet 0  . Multiple Vitamins-Minerals (MULTIVITAMIN WITH MINERALS) tablet Take 1 tablet by mouth every evening.    . nitroGLYCERIN (NITROSTAT) 0.4 MG SL tablet Place 1 tablet (0.4 mg total) under the tongue every 5 (five) minutes as needed. For chest pain 25 tablet 6  . oxycodone (OXY-IR) 5 MG capsule Take 5 mg by mouth every 4 (four) hours as needed.    . polyethylene glycol powder (GLYCOLAX/MIRALAX) powder Take 17 g by mouth 3 times/day as needed-between meals & bedtime for moderate  constipation or severe constipation (Goal is 1-2 BM/day.  Increase to up to 6 doses a day). 500 g 10  . potassium chloride SA (K-DUR,KLOR-CON) 20 MEQ tablet Take 1 tablet (20 mEq total) by mouth daily. 90 tablet 3  . vitamin C (ASCORBIC ACID) 500 MG tablet Take 1 tablet (500 mg total) by mouth every morning. 90 tablet 3  . warfarin (COUMADIN) 5 MG tablet Take 1.5-2 tablets (7.5-10 mg total) by  mouth See admin instructions. 7.5 mg Sun and Fri.  10 mg Mon, Tues, Wed, Thurs and Sat 180 tablet 2   Current Facility-Administered Medications on File Prior to Visit  Medication Dose Route Frequency Provider Last Rate Last Dose  . 0.9 %  sodium chloride infusion  500 mL Intravenous Continuous Nelida Meuse III, MD       Allergies  Allergen Reactions  . Pineapple Concentrate Nausea And Vomiting    Recent Results (from the past 2160 hour(s))  POCT INR     Status: None   Collection Time: 04/16/16  2:12 PM  Result Value Ref Range   INR 2.0   POCT INR     Status: None   Collection Time: 04/29/16 12:00 AM  Result Value Ref Range   INR 2.7     Comment: in office - nurse  Glucose, capillary     Status: Abnormal   Collection Time: 05/21/16  1:11 PM  Result Value Ref Range   Glucose-Capillary 266 (H) 65 - 99 mg/dL  Hemoglobin A1c     Status: Abnormal   Collection Time: 05/21/16  1:49 PM  Result Value Ref Range   Hgb A1c MFr Bld 8.0 (H) 4.8 - 5.6 %    Comment: (NOTE)         Pre-diabetes: 5.7 - 6.4         Diabetes: >6.4         Glycemic control for adults with diabetes: <7.0    Mean Plasma Glucose 183 mg/dL    Comment: (NOTE) Performed At: [facility] [place], [place] [id number] Lindon Romp MD [id number]   CBC     Status: Abnormal   Collection Time: 05/21/16  1:49 PM  Result Value Ref Range   WBC 6.1 4.0 - 10.5 K/uL   RBC 5.21 4.22 - 5.81 MIL/uL   Hemoglobin 14.8 13.0 - 17.0 g/dL   HCT 46.4 39.0 - 52.0 %   MCV 89.1 78.0 - 100.0 fL   MCH 28.4 26.0 - 34.0 pg   MCHC 31.9 30.0 - 36.0 g/dL   RDW 14.0 11.5 - 15.5 %   Platelets 138 (L) 150 - 400 K/uL  Basic metabolic panel     Status: Abnormal   Collection Time: 05/21/16  1:49 PM  Result Value Ref Range   Sodium 141 135 - 145 mmol/L   Potassium 4.1 3.5 - 5.1 mmol/L   Chloride 106 101 - 111 mmol/L   CO2 28 22 - 32 mmol/L   Glucose, Bld 162 (H) 65 - 99 mg/dL   BUN 15 6 - 20 mg/dL    Creatinine, Ser 1.10 0.61 - 1.24 mg/dL   Calcium 10.2 8.9 - 10.3 mg/dL   GFR calc non Af Amer >60 >60 mL/min   GFR calc Af Amer >60 >60 mL/min    Comment: (NOTE) The eGFR has been calculated using the CKD EPI equation. This calculation has not been validated in all clinical situations. eGFR's persistently <60 mL/min signify possible Chronic Kidney Disease.  Anion gap 7 5 - 15  Type and screen All Cardiac and thoracic surgeries, spinal fusions, myomectomies, craniotomies, colon & liver resections, total joint revisions, same day c-section with placenta previa or accreta.     Status: None   Collection Time: 05/21/16  1:49 PM  Result Value Ref Range   ABO/RH(D) O POS    Antibody Screen NEG    Sample Expiration 05/29/2016    Extend sample reason NO TRANSFUSIONS OR PREGNANCY IN THE PAST 3 MONTHS   Protime-INR     Status: Abnormal   Collection Time: 05/21/16  1:49 PM  Result Value Ref Range   Prothrombin Time 31.7 (H) 11.4 - 15.2 seconds   INR 2.99   ABO/Rh     Status: None   Collection Time: 05/21/16  1:49 PM  Result Value Ref Range   ABO/RH(D) O POS   Glucose, capillary     Status: Abnormal   Collection Time: 05/26/16  8:55 AM  Result Value Ref Range   Glucose-Capillary 178 (H) 65 - 99 mg/dL   Comment 1 Notify RN   Protime-INR     Status: Abnormal   Collection Time: 05/26/16  9:20 AM  Result Value Ref Range   Prothrombin Time 15.8 (H) 11.4 - 15.2 seconds   INR 1.26   Glucose, capillary     Status: Abnormal   Collection Time: 05/26/16  3:03 PM  Result Value Ref Range   Glucose-Capillary 190 (H) 65 - 99 mg/dL  Glucose, capillary     Status: Abnormal   Collection Time: 05/26/16  4:28 PM  Result Value Ref Range   Glucose-Capillary 198 (H) 65 - 99 mg/dL  Blood gas, arterial     Status: Abnormal   Collection Time: 05/26/16  5:10 PM  Result Value Ref Range   FIO2 40.00    Delivery systems VENTILATOR    Mode PRESSURE REGULATED VOLUME CONTROL    VT 550 mL   LHR 18 resp/min    Peep/cpap 5.0 cm H20   pH, Arterial 7.441 7.350 - 7.450   pCO2 arterial 39.9 32.0 - 48.0 mmHg   pO2, Arterial 67.0 (L) 83.0 - 108.0 mmHg   Bicarbonate 26.7 20.0 - 28.0 mmol/L   Acid-Base Excess 2.9 (H) 0.0 - 2.0 mmol/L   O2 Saturation 93.1 %   Patient temperature 98.6    Collection site RIGHT RADIAL    Drawn by [phone]    Sample type ARTERIAL DRAW    Allens test (pass/fail) PASS PASS  MRSA PCR Screening     Status: None   Collection Time: 05/26/16  5:20 PM  Result Value Ref Range   MRSA by PCR NEGATIVE NEGATIVE    Comment:        The GeneXpert MRSA Assay (FDA approved for NASAL specimens only), is one component of a comprehensive MRSA colonization surveillance program. It is not intended to diagnose MRSA infection nor to guide or monitor treatment for MRSA infections.   Procalcitonin - Baseline     Status: None   Collection Time: 05/26/16  6:23 PM  Result Value Ref Range   Procalcitonin <0.10 ng/mL    Comment:        Interpretation: PCT (Procalcitonin) <= 0.5 ng/mL: Systemic infection (sepsis) is not likely. Local bacterial infection is possible. (NOTE)         ICU PCT Algorithm               Non ICU PCT Algorithm    ----------------------------     ------------------------------  PCT < 0.25 ng/mL                 PCT < 0.1 ng/mL     Stopping of antibiotics            Stopping of antibiotics       strongly encouraged.               strongly encouraged.    ----------------------------     ------------------------------       PCT level decrease by               PCT < 0.25 ng/mL       >= 80% from peak PCT       OR PCT 0.25 - 0.5 ng/mL          Stopping of antibiotics                                             encouraged.     Stopping of antibiotics           encouraged.    ----------------------------     ------------------------------       PCT level decrease by              PCT >= 0.25 ng/mL       < 80% from peak PCT        AND PCT >= 0.5 ng/mL             Continuin g antibiotics                                              encouraged.       Continuing antibiotics            encouraged.    ----------------------------     ------------------------------     PCT level increase compared          PCT > 0.5 ng/mL         with peak PCT AND          PCT >= 0.5 ng/mL             Escalation of antibiotics                                          strongly encouraged.      Escalation of antibiotics        strongly encouraged.   Glucose, capillary     Status: Abnormal   Collection Time: 05/26/16  7:51 PM  Result Value Ref Range   Glucose-Capillary 188 (H) 65 - 99 mg/dL   Comment 1 Notify RN    Comment 2 Document in Chart   Glucose, capillary     Status: Abnormal   Collection Time: 05/26/16 11:32 PM  Result Value Ref Range   Glucose-Capillary 223 (H) 65 - 99 mg/dL   Comment 1 Notify RN    Comment 2 Document in Chart   Procalcitonin     Status: None   Collection Time: 05/27/16  3:43 AM  Result Value Ref Range   Procalcitonin 0.96 ng/mL    Comment:  Interpretation: PCT > 0.5 ng/mL and <= 2 ng/mL: Systemic infection (sepsis) is possible, but other conditions are known to elevate PCT as well. (NOTE)         ICU PCT Algorithm               Non ICU PCT Algorithm    ----------------------------     ------------------------------         PCT < 0.25 ng/mL                 PCT < 0.1 ng/mL     Stopping of antibiotics            Stopping of antibiotics       strongly encouraged.               strongly encouraged.    ----------------------------     ------------------------------       PCT level decrease by               PCT < 0.25 ng/mL       >= 80% from peak PCT       OR PCT 0.25 - 0.5 ng/mL          Stopping of antibiotics                                             encouraged.     Stopping of antibiotics           encouraged.    ----------------------------     ------------------------------       PCT level decrease by              PCT  >= 0.25 ng/mL       < 80% from peak PCT        AND PCT >= 0.5 ng/mL             Continuing antibiotics                                              encouraged.       Continuing antibiotics            encouraged.    ----------------------------     ------------------------------     PCT level increase compared          PCT > 0.5 ng/mL         with peak PCT AND          PCT >= 0.5 ng/mL             Escalation of antibiotics                                          strongly encouraged.      Escalation of antibiotics        strongly encouraged.   Basic metabolic panel     Status: Abnormal   Collection Time: 05/27/16  3:43 AM  Result Value Ref Range   Sodium 135 135 - 145 mmol/L   Potassium 4.6 3.5 - 5.1 mmol/L   Chloride 102 101 - 111 mmol/L   CO2 25 22 - 32  mmol/L   Glucose, Bld 230 (H) 65 - 99 mg/dL   BUN 14 6 - 20 mg/dL   Creatinine, Ser 1.12 0.61 - 1.24 mg/dL   Calcium 9.4 8.9 - 10.3 mg/dL   GFR calc non Af Amer >60 >60 mL/min   GFR calc Af Amer >60 >60 mL/min    Comment: (NOTE) The eGFR has been calculated using the CKD EPI equation. This calculation has not been validated in all clinical situations. eGFR's persistently <60 mL/min signify possible Chronic Kidney Disease.    Anion gap 8 5 - 15  Glucose, capillary     Status: Abnormal   Collection Time: 05/27/16  4:06 AM  Result Value Ref Range   Glucose-Capillary 239 (H) 65 - 99 mg/dL   Comment 1 Notify RN    Comment 2 Document in Chart   Glucose, capillary     Status: Abnormal   Collection Time: 05/27/16  7:58 AM  Result Value Ref Range   Glucose-Capillary 214 (H) 65 - 99 mg/dL   Comment 1 Notify RN    Comment 2 Document in Chart   Glucose, capillary     Status: None   Collection Time: 05/27/16 12:19 PM  Result Value Ref Range   Glucose-Capillary 98 65 - 99 mg/dL   Comment 1 Notify RN    Comment 2 Document in Chart   Glucose, capillary     Status: None   Collection Time: 05/27/16  4:55 PM  Result Value Ref  Range   Glucose-Capillary 94 65 - 99 mg/dL   Comment 1 Notify RN    Comment 2 Document in Chart   Glucose, capillary     Status: Abnormal   Collection Time: 05/27/16  8:47 PM  Result Value Ref Range   Glucose-Capillary 114 (H) 65 - 99 mg/dL   Comment 1 Notify RN   Glucose, capillary     Status: Abnormal   Collection Time: 05/27/16  9:22 PM  Result Value Ref Range   Glucose-Capillary 124 (H) 65 - 99 mg/dL   Comment 1 Notify RN    Comment 2 Document in Chart   Glucose, capillary     Status: Abnormal   Collection Time: 05/28/16  1:23 AM  Result Value Ref Range   Glucose-Capillary 108 (H) 65 - 99 mg/dL  Procalcitonin     Status: None   Collection Time: 05/28/16  3:15 AM  Result Value Ref Range   Procalcitonin 0.92 ng/mL    Comment:        Interpretation: PCT > 0.5 ng/mL and <= 2 ng/mL: Systemic infection (sepsis) is possible, but other conditions are known to elevate PCT as well. (NOTE)         ICU PCT Algorithm               Non ICU PCT Algorithm    ----------------------------     ------------------------------         PCT < 0.25 ng/mL                 PCT < 0.1 ng/mL     Stopping of antibiotics            Stopping of antibiotics       strongly encouraged.               strongly encouraged.    ----------------------------     ------------------------------       PCT level decrease by  PCT < 0.25 ng/mL       >= 80% from peak PCT       OR PCT 0.25 - 0.5 ng/mL          Stopping of antibiotics                                             encouraged.     Stopping of antibiotics           encouraged.    ----------------------------     ------------------------------       PCT level decrease by              PCT >= 0.25 ng/mL       < 80% from peak PCT        AND PCT >= 0.5 ng/mL             Continuing antibiotics                                              encouraged.       Continuing antibiotics            encouraged.    ----------------------------      ------------------------------     PCT level increase compared          PCT > 0.5 ng/mL         with peak PCT AND          PCT >= 0.5 ng/mL             Escalation of antibiotics                                          strongly encouraged.      Escalation of antibiotics        strongly encouraged.   Magnesium     Status: None   Collection Time: 05/28/16  3:15 AM  Result Value Ref Range   Magnesium 2.1 1.7 - 2.4 mg/dL  Potassium     Status: None   Collection Time: 05/28/16  3:15 AM  Result Value Ref Range   Potassium 4.2 3.5 - 5.1 mmol/L  Creatinine, serum     Status: Abnormal   Collection Time: 05/28/16  3:15 AM  Result Value Ref Range   Creatinine, Ser 1.26 (H) 0.61 - 1.24 mg/dL   GFR calc non Af Amer 55 (L) >60 mL/min   GFR calc Af Amer >60 >60 mL/min    Comment: (NOTE) The eGFR has been calculated using the CKD EPI equation. This calculation has not been validated in all clinical situations. eGFR's persistently <60 mL/min signify possible Chronic Kidney Disease.   Glucose, capillary     Status: Abnormal   Collection Time: 05/28/16  5:11 AM  Result Value Ref Range   Glucose-Capillary 128 (H) 65 - 99 mg/dL   Comment 1 Notify RN   Glucose, capillary     Status: Abnormal   Collection Time: 05/28/16  7:41 AM  Result Value Ref Range   Glucose-Capillary 122 (H) 65 - 99 mg/dL  Glucose, capillary     Status: Abnormal   Collection Time:  05/28/16 12:46 PM  Result Value Ref Range   Glucose-Capillary 120 (H) 65 - 99 mg/dL  Glucose, capillary     Status: Abnormal   Collection Time: 05/28/16  5:09 PM  Result Value Ref Range   Glucose-Capillary 104 (H) 65 - 99 mg/dL  Glucose, capillary     Status: Abnormal   Collection Time: 05/28/16  9:19 PM  Result Value Ref Range   Glucose-Capillary 114 (H) 65 - 99 mg/dL  Protime-INR     Status: Abnormal   Collection Time: 05/29/16  5:01 AM  Result Value Ref Range   Prothrombin Time 16.6 (H) 11.4 - 15.2 seconds   INR 1.33   Glucose,  capillary     Status: Abnormal   Collection Time: 05/29/16  7:27 AM  Result Value Ref Range   Glucose-Capillary 129 (H) 65 - 99 mg/dL  Glucose, capillary     Status: Abnormal   Collection Time: 05/29/16 11:46 AM  Result Value Ref Range   Glucose-Capillary 147 (H) 65 - 99 mg/dL  CBC     Status: Abnormal   Collection Time: 06/03/16 11:50 AM  Result Value Ref Range   WBC 7.2 3.8 - 10.8 K/uL   RBC 4.46 4.20 - 5.80 MIL/uL   Hemoglobin 12.8 (L) 13.2 - 17.1 g/dL   HCT 39.9 38.5 - 50.0 %   MCV 89.5 80.0 - 100.0 fL   MCH 28.7 27.0 - 33.0 pg   MCHC 32.1 32.0 - 36.0 g/dL   RDW 15.2 (H) 11.0 - 15.0 %   Platelets 210 140 - 400 K/uL   MPV 10.1 7.5 - 12.5 fL  POCT INR     Status: None   Collection Time: 06/04/16 12:00 PM  Result Value Ref Range   INR 3.1     Objective: General: Patient is awake, alert, and oriented x 3 and in no acute distress.  Integument: Skin is warm, dry and supple bilateral. Nails are tender, long, thickened and  dystrophic with subungual debris, consistent with onychomycosis, 1-5 bilateral. No signs of infection. Mild reactive callus at right 1st toe. No open lesions or preulcerative lesions present bilateral. Remaining integument unremarkable.  Vasculature:  Dorsalis Pedis pulse 1/4 bilateral. Posterior Tibial pulse  0/4 bilateral.  Capillary fill time <5 sec 1-5 bilateral. No hair growth to the level of the digits. Temperature gradient within normal limits. No varicosities present bilateral. Trace edema present bilateral.   Neurology: The patient has absent sensation measured with a 5.07/10g Semmes Weinstein Monofilament at all pedal sites bilateral . Vibratory sensation absent bilateral with tuning fork. No Babinski sign present bilateral.   Musculoskeletal: Asymptomatic hammertoe pedal deformities noted bilateral. Muscular strength 5/5 in all lower extremity muscular groups bilateral without pain on range of motion . No tenderness with calf compression  bilateral.  Assessment and Plan: Problem List Items Addressed This Visit      Endocrine   Diabetic neuropathy ([place])    Other Visit Diagnoses    Dermatophytosis of nail    -  Primary   Toe pain, bilateral       Hammer toes of both feet       Callus of foot       minimal at right 1st toe      -Examined patient. -Discussed and educated patient on diabetic foot care, especially with  regards to the vascular, neurological and musculoskeletal systems.  -Stressed the importance of good glycemic control and the detriment of not  controlling glucose levels in relation  to the foot. -Mechanically debrided all nails 1-5 bilateral using sterile nail nipper and filed with dremel without incident  -[employer] diabetic shoe order form was completed; office to contact primary care for approval / certification;  Office to arrange shoe fitting and dispensing. -Answered all patient questions -Patient to return in 3 months for at risk foot care -Patient advised to call the office if any problems or questions arise in the meantime.  Landis Martins, DPM

## 2016-07-08 DIAGNOSIS — I2510 Atherosclerotic heart disease of native coronary artery without angina pectoris: Secondary | ICD-10-CM | POA: Diagnosis not present

## 2016-07-08 DIAGNOSIS — G3184 Mild cognitive impairment, so stated: Secondary | ICD-10-CM | POA: Diagnosis not present

## 2016-07-08 DIAGNOSIS — Z483 Aftercare following surgery for neoplasm: Secondary | ICD-10-CM | POA: Diagnosis not present

## 2016-07-08 DIAGNOSIS — M48061 Spinal stenosis, lumbar region without neurogenic claudication: Secondary | ICD-10-CM | POA: Diagnosis not present

## 2016-07-08 DIAGNOSIS — I482 Chronic atrial fibrillation: Secondary | ICD-10-CM | POA: Diagnosis not present

## 2016-07-08 DIAGNOSIS — I5032 Chronic diastolic (congestive) heart failure: Secondary | ICD-10-CM | POA: Diagnosis not present

## 2016-07-08 DIAGNOSIS — I110 Hypertensive heart disease with heart failure: Secondary | ICD-10-CM | POA: Diagnosis not present

## 2016-07-08 DIAGNOSIS — J449 Chronic obstructive pulmonary disease, unspecified: Secondary | ICD-10-CM | POA: Diagnosis not present

## 2016-07-08 DIAGNOSIS — E1140 Type 2 diabetes mellitus with diabetic neuropathy, unspecified: Secondary | ICD-10-CM | POA: Diagnosis not present

## 2016-07-10 DIAGNOSIS — H43812 Vitreous degeneration, left eye: Secondary | ICD-10-CM | POA: Diagnosis not present

## 2016-07-10 DIAGNOSIS — H2513 Age-related nuclear cataract, bilateral: Secondary | ICD-10-CM | POA: Diagnosis not present

## 2016-07-10 DIAGNOSIS — E119 Type 2 diabetes mellitus without complications: Secondary | ICD-10-CM | POA: Diagnosis not present

## 2016-07-13 DIAGNOSIS — E1140 Type 2 diabetes mellitus with diabetic neuropathy, unspecified: Secondary | ICD-10-CM | POA: Diagnosis not present

## 2016-07-13 DIAGNOSIS — G3184 Mild cognitive impairment, so stated: Secondary | ICD-10-CM | POA: Diagnosis not present

## 2016-07-13 DIAGNOSIS — Z483 Aftercare following surgery for neoplasm: Secondary | ICD-10-CM | POA: Diagnosis not present

## 2016-07-13 DIAGNOSIS — I110 Hypertensive heart disease with heart failure: Secondary | ICD-10-CM | POA: Diagnosis not present

## 2016-07-13 DIAGNOSIS — M48061 Spinal stenosis, lumbar region without neurogenic claudication: Secondary | ICD-10-CM | POA: Diagnosis not present

## 2016-07-13 DIAGNOSIS — J449 Chronic obstructive pulmonary disease, unspecified: Secondary | ICD-10-CM | POA: Diagnosis not present

## 2016-07-13 DIAGNOSIS — I2510 Atherosclerotic heart disease of native coronary artery without angina pectoris: Secondary | ICD-10-CM | POA: Diagnosis not present

## 2016-07-13 DIAGNOSIS — I482 Chronic atrial fibrillation: Secondary | ICD-10-CM | POA: Diagnosis not present

## 2016-07-13 DIAGNOSIS — I5032 Chronic diastolic (congestive) heart failure: Secondary | ICD-10-CM | POA: Diagnosis not present

## 2016-07-15 DIAGNOSIS — G3184 Mild cognitive impairment, so stated: Secondary | ICD-10-CM | POA: Diagnosis not present

## 2016-07-15 DIAGNOSIS — I482 Chronic atrial fibrillation: Secondary | ICD-10-CM | POA: Diagnosis not present

## 2016-07-15 DIAGNOSIS — I110 Hypertensive heart disease with heart failure: Secondary | ICD-10-CM | POA: Diagnosis not present

## 2016-07-15 DIAGNOSIS — I2510 Atherosclerotic heart disease of native coronary artery without angina pectoris: Secondary | ICD-10-CM | POA: Diagnosis not present

## 2016-07-15 DIAGNOSIS — J449 Chronic obstructive pulmonary disease, unspecified: Secondary | ICD-10-CM | POA: Diagnosis not present

## 2016-07-15 DIAGNOSIS — E1140 Type 2 diabetes mellitus with diabetic neuropathy, unspecified: Secondary | ICD-10-CM | POA: Diagnosis not present

## 2016-07-15 DIAGNOSIS — Z483 Aftercare following surgery for neoplasm: Secondary | ICD-10-CM | POA: Diagnosis not present

## 2016-07-15 DIAGNOSIS — I5032 Chronic diastolic (congestive) heart failure: Secondary | ICD-10-CM | POA: Diagnosis not present

## 2016-07-15 DIAGNOSIS — M48061 Spinal stenosis, lumbar region without neurogenic claudication: Secondary | ICD-10-CM | POA: Diagnosis not present

## 2016-07-17 ENCOUNTER — Ambulatory Visit: Payer: Commercial Managed Care - HMO

## 2016-07-22 DIAGNOSIS — Z483 Aftercare following surgery for neoplasm: Secondary | ICD-10-CM | POA: Diagnosis not present

## 2016-07-22 DIAGNOSIS — M48061 Spinal stenosis, lumbar region without neurogenic claudication: Secondary | ICD-10-CM | POA: Diagnosis not present

## 2016-07-22 DIAGNOSIS — I482 Chronic atrial fibrillation: Secondary | ICD-10-CM | POA: Diagnosis not present

## 2016-07-22 DIAGNOSIS — G3184 Mild cognitive impairment, so stated: Secondary | ICD-10-CM | POA: Diagnosis not present

## 2016-07-22 DIAGNOSIS — I2510 Atherosclerotic heart disease of native coronary artery without angina pectoris: Secondary | ICD-10-CM | POA: Diagnosis not present

## 2016-07-22 DIAGNOSIS — E1140 Type 2 diabetes mellitus with diabetic neuropathy, unspecified: Secondary | ICD-10-CM | POA: Diagnosis not present

## 2016-07-22 DIAGNOSIS — I5032 Chronic diastolic (congestive) heart failure: Secondary | ICD-10-CM | POA: Diagnosis not present

## 2016-07-22 DIAGNOSIS — J449 Chronic obstructive pulmonary disease, unspecified: Secondary | ICD-10-CM | POA: Diagnosis not present

## 2016-07-22 DIAGNOSIS — I110 Hypertensive heart disease with heart failure: Secondary | ICD-10-CM | POA: Diagnosis not present

## 2016-07-24 ENCOUNTER — Encounter: Payer: Self-pay | Admitting: Cardiology

## 2016-07-24 ENCOUNTER — Ambulatory Visit (INDEPENDENT_AMBULATORY_CARE_PROVIDER_SITE_OTHER): Payer: Medicare HMO | Admitting: *Deleted

## 2016-07-24 DIAGNOSIS — I359 Nonrheumatic aortic valve disorder, unspecified: Secondary | ICD-10-CM

## 2016-07-24 LAB — POCT INR: INR: 2.3

## 2016-07-31 NOTE — Progress Notes (Signed)
Cardiology Office Note    Date:  08/03/2016   ID:  EPIC TRIBBETT, DOB 06/24/1940, MRN [id number]  PCP:  Donnamae Jude, MD  Cardiologist:  [name] Sleep clinic: Dr. Claiborne Billings   Chief Complaint  Patient presents with  . Follow-up    2 months;  . Coronary Artery Disease  . Hypertension    History of Present Illness:  Matthew Edwards is a 76 y.o. male with PMH of CAD s/p stent to LAD and LCx, chronic atrial fibrillation on Coumadin, COPD, GERD, hypertension, hyperlipidemia, DM II, severe OSA and s/p AVR. In 2012, he underwent aortic valve replacement with #41m Edwards pericardial valve and had LIMA to LAD CABG. Last cardiac catheterization performed in January 2012 prior to CABG and valvular replacement surgery shows patent LAD stent with 40% stenosis proximal to the stent, patent left circumflex stent. He was initially placed on CPAP, this was later transitioned to BiPAP. His BiPAP has been managed by Dr. KClaiborne Billings He presented to the hospital in December 2016 for vertigo and was prescribed meclizine. He was also admitted in April for recurrent falls and weakness concerning for CVA versus spinal etiology. Neurology was consulted at the time, MRI of the brain as well as cervical and lumbar spine was negative except for atrophy and chronic microvascular ischemic changes. Urine drug test and EtOH was negative. TSH and RPR were normal as well. Vitamin B-12 was normal however on the lower side of range, he was given vitamin B-12 injection prior to leaving the hospital. He was seen by family medicine in October 2017 for altered mental status, it was felt that his dizziness is likely related to orthostatic hypotension in the setting of diarrhea.  The patient was seen 04/29/2016 for preoperative clearance at the recommendation of Dr. GJohney Maine He had distal rectal polyp that was concerning for high-grade dysplasia. He did have some exertional shortness of breath along with chest discomfort, however this  occurs only with strenuous activity. Given his stable angina, he was cleared for surgery. Since his CHA2DS2-Vasc score was 4, he did not require any bridging with lovenox (which is recommended for pt with CHA2DS2-Vasc score > 4). He underwent the planned procedure on 05/26/2016 with TEM partial proctectomy of rectal mass. Postprocedure, he was slow to recover from anesthesia and was intermittently agitated. Pulmonary critical care service was consulted briefly as he was unable to be extubated in the PACU, fortunately this has quickly resolved. He was later discharged on 05/29/2016.  On follow up today he states he is doing well.   He still have some baseline dementia. He is accompanied by his daughter. Most complaints center around his bowels with alternating constipation and diarrhea depending on Miralax dose.  He denies any chest pain, dyspnea, or edema. Weight stable. BP at home 135-140.   Past Medical History:  Diagnosis Date  . Arthritis   . Atrial fibrillation ([place]   . CAD (coronary artery disease) 2012   [place]-LAD  . COPD (chronic obstructive pulmonary disease) ([place]   . GERD (gastroesophageal reflux disease)   . HEART FAILURE, CONGESTIVE UNSPEC 02/23/2007   Qualifier: Diagnosis of  By: MMellody DrownMD, [facility]   . Hyperlipidemia   . Hypertension   . OSA (obstructive sleep apnea) 11/13/2014  . S/P AVR    #25 mm Edwards pericardial valve Bioprosthetic. Dr BCyndia Bent  . Situational depression    "son passed 11/2013"  . Sleep apnea    occ uses c pap does not know settings   .  Stented coronary artery 2013  . TOBACCO DEPENDENCE 07/29/2006   Qualifier: History of  By: Carlena Sax  MD, Colletta [place]    . Type II diabetes mellitus ([place])     Past Surgical History:  Procedure Laterality Date  . AORTIC VALVE REPLACEMENT  07/2010   notes 07/28/2010  . CARDIAC CATHETERIZATION  06/2010   Archie Endo 07/01/2010  . CORONARY ANGIOPLASTY WITH STENT PLACEMENT  2006; 10/2006   "2"; 1/notes 10/01/2010  . CORONARY ARTERY  BYPASS GRAFT  07/2010   "1"/notes 07/28/2010  . PARTIAL PROCTECTOMY BY TEM N/A 05/26/2016   Procedure: TEM PARTIAL PROCTECTOMY OF RECTAL MASS;  Surgeon: [name], MD;  Location: [facility];  Service: General;  Laterality: N/A;    Current Medications: Outpatient Medications Prior to Visit  Medication Sig Dispense Refill  . atorvastatin (LIPITOR) 40 MG tablet Take 1 tablet (40 mg total) by mouth daily at 6 PM. 90 tablet 0  . benazepril (LOTENSIN) 20 MG tablet Take 1 tablet (20 mg total) by mouth daily. 90 tablet 0  . Blood Glucose Monitoring Suppl (ACCU-CHEK AVIVA PLUS) W/DEVICE KIT 1 Units by Does not apply route 3 (three) times daily. 1 kit 0  . budesonide-formoterol (SYMBICORT) 80-4.5 MCG/ACT inhaler Inhale 2 puffs into the lungs 2 (two) times daily. 10 Inhaler 11  . Cholecalciferol (VITAMIN D-3 PO) Take 1,000 Units by mouth daily.    . feeding supplement, GLUCERNA SHAKE, (GLUCERNA SHAKE) LIQD Take 237 mLs by mouth 3 (three) times daily between meals. 90 Can 3  . furosemide (LASIX) 40 MG tablet Take 1 tablet (40 mg total) by mouth daily. 90 tablet 3  . glipiZIDE (GLUCOTROL) 10 MG tablet Take 1 tablet by mouth at 5 PM with a meal. 90 tablet 0  . glucose blood (ACCU-CHEK AVIVA PLUS) test strip USE TO CHECK BLOOD SUGARS FOUR TIMES DAILY 400 each 3  . metoprolol succinate (TOPROL-XL) 25 MG 24 hr tablet Take 1 tablet (25 mg total) by mouth daily. Reported on 11/28/2015 90 tablet 0  . Multiple Vitamins-Minerals (MULTIVITAMIN WITH MINERALS) tablet Take 1 tablet by mouth every evening.    Marland Kitchen oxycodone (OXY-IR) 5 MG capsule Take 5 mg by mouth every 4 (four) hours as needed.    . polyethylene glycol powder (GLYCOLAX/MIRALAX) powder Take 17 g by mouth 3 times/day as needed-between meals & bedtime for moderate constipation or severe constipation (Goal is 1-2 BM/day.  Increase to up to 6 doses a day). 500 g 10  . potassium chloride SA (K-DUR,KLOR-CON) 20 MEQ tablet Take 1 tablet (20 mEq total) by mouth daily.  90 tablet 3  . vitamin C (ASCORBIC ACID) 500 MG tablet Take 1 tablet (500 mg total) by mouth every morning. 90 tablet 3  . warfarin (COUMADIN) 5 MG tablet Take 1.5-2 tablets (7.5-10 mg total) by mouth See admin instructions. 7.5 mg Sun and Fri.  10 mg Mon, Tues, Wed, Thurs and Sat 180 tablet 2  . nitroGLYCERIN (NITROSTAT) 0.4 MG SL tablet Place 1 tablet (0.4 mg total) under the tongue every 5 (five) minutes as needed. For chest pain 25 tablet 6   Facility-Administered Medications Prior to Visit  Medication Dose Route Frequency Provider Last Rate Last Dose  . 0.9 %  sodium chloride infusion  500 mL Intravenous Continuous Nelida Meuse III, MD         Allergies:   Pineapple concentrate   Social History   Social History  . Marital status: Legally Separated    Spouse name: N/A  .  Number of children: 7  . Years of education: 66   Occupational History  . Retired-[employer]  . [employer]    Social History Main Topics  . Smoking status: Former Smoker    Packs/day: 0.50    Years: 46.00    Types: Cigarettes  . Smokeless tobacco: Never Used  . Alcohol use No  . Drug use: No  . Sexual activity: Not Asked   Other Topics Concern  . None   Social History Narrative   Health Care POA:    Emergency Contact: Daughter, Matthew Edwards   End of Life Plan:    Who lives with you: self   Any pets: none   Diet: Patient has a varied diet of protein, starch, and vegetables.   Exercise: Pt has not regular exercise routine.   Seatbelts: Pt reports wearing seatbelt when in vehile.   Nancy Fetter Exposure/Protection:    Hobbies: fishing, tv           Family History:  The patient's family history includes Heart disease in his mother.   ROS:   Please see the history of present illness.    ROS All other systems reviewed and are negative.   PHYSICAL EXAM:   VS:  BP (!) 146/73   Pulse 78   Ht _0  (1.753 m)   Wt 247 lb 3.2 oz (112.1 kg)   BMI 36.51 kg/m    GEN: Well nourished, well developed,  in no acute distress  HEENT: normal  Neck: no JVD, carotid bruits, or masses Cardiac: irregularly irregular; no murmurs, rubs, or gallops. No edema. Respiratory:  clear to auscultation bilaterally, normal work of breathing GI: soft, nontender, nondistended, + BS MS: no deformity or atrophy  Skin: warm and dry, no rash Neuro:  Alert and Oriented x 3, Strength and sensation are intact Psych: euthymic mood, full affect  Wt Readings from Last 3 Encounters:  08/03/16 247 lb 3.2 oz (112.1 kg)  06/04/16 245 lb (111.1 kg)  06/03/16 250 lb (113.4 kg)      Studies/Labs Reviewed:   EKG:  EKG is not ordered today.    Recent Labs: 09/19/2015: TSH 1.014 09/24/2015: ALT 26 05/27/2016: BUN 14; Sodium 135 05/28/2016: Creatinine, Ser 1.26; Magnesium 2.1; Potassium 4.2 06/03/2016: Hemoglobin 12.8; Platelets 210   Lipid Panel    Component Value Date/Time   CHOL 145 07/25/2015 1558   TRIG 181 (H) 07/25/2015 1558   HDL 39 (L) 07/25/2015 1558   CHOLHDL 3.7 07/25/2015 1558   VLDL 36 (H) 07/25/2015 1558   LDLCALC 70 07/25/2015 1558   LDLDIRECT 81 12/23/2012 1638    Additional studies/ records that were reviewed today include:  Cath 11/15/2006 FINAL IMPRESSIONS: 1. Multivessel coronary artery disease with patent stents in LAD and circumflex. 2. New de novo lesion in the proximal circumflex obtuse marginal branch of 95%. 3. Successful direct stenting of the proximal obtuse marginal branch #1 with a non drug coated stent. 4. Aortic stenosis with a transvalvular gradient of 47 mm. 5. Hypertrophic profile of left ventricle with ejection fraction of 75- 85%.   Cath 06/30/2010 SELECTIVE CORONARY ANGIOGRAPHY: 1. Left main normal. 2. LAD; the mid-LAD stent was widely patent. There was a 40% stenosis just proximal to the stent. 3. Left circumflex; the mid AV groove circumflex stent was widely patent. 4. Right coronary; this was a dominant vessel and was free  of significant disease. 5. Supravalvular aortography; supravalvar aortogram was performed using 20 mL of Visipaque dye at 20 mL  per second. There was no aortic insufficiency noted. The supravalvular ascending thoracic aorta was not aneurysmal.    ASSESSMENT:    1. S/P AVR (aortic valve replacement)   2. Coronary artery disease involving coronary bypass graft of native heart without angina pectoris   3. Chronic atrial fibrillation ([place])   4. OSA on CPAP      PLAN:  In order of problems listed above:  1. OSA- patient states he has not been using Bipap due to drying out his throat. He is overdue to see Dr. Claiborne Billings- will arrange follow up.   2. CAD s/p CABG: Last cardiac catheterization in 2012, stable anatomy at the time with patent stent in left circumflex and LAD. Underwent LIMA to LAD bypass surgery during valvular surgery. He is asymptomatic.  3. Hypertension: Blood pressure stable today  4. Hyperlipidemia: On Lipitor daily. Labs satisfactory  5. DM II: On glipizide   6. s/p aortic valve replacement: Last echo March 2016, EF 50-55%, stable anatomy with bioprosthetic aortic valve. He is due for repeat echocardiogram around March 2018. 7. Edema- well controlled on current lasix dose.    Medication Adjustments/Labs and Tests Ordered: Current medicines are reviewed at length with the patient today.  Concerns regarding medicines are outlined above.  Medication changes, Labs and Tests ordered today are listed in the Patient Instructions below. Patient Instructions  Continue your current therapy  Resume your CPAP therapy  We will arrange follow up with Dr. Claiborne Billings for your sleep apnea  I will see you in 6 months.    Signed, Peter Martinique, MD  08/03/2016 10:29 AM    [facility] [place], [place], [place]  [address] Phone: [phone]; Fax: [phone]

## 2016-08-03 ENCOUNTER — Encounter: Payer: Self-pay | Admitting: Cardiology

## 2016-08-03 ENCOUNTER — Ambulatory Visit (INDEPENDENT_AMBULATORY_CARE_PROVIDER_SITE_OTHER): Payer: Medicare HMO | Admitting: Cardiology

## 2016-08-03 VITALS — BP 146/73 | HR 78 | Ht 69.0 in | Wt 247.2 lb

## 2016-08-03 DIAGNOSIS — Z952 Presence of prosthetic heart valve: Secondary | ICD-10-CM | POA: Diagnosis not present

## 2016-08-03 DIAGNOSIS — I482 Chronic atrial fibrillation, unspecified: Secondary | ICD-10-CM

## 2016-08-03 DIAGNOSIS — Z9989 Dependence on other enabling machines and devices: Secondary | ICD-10-CM | POA: Diagnosis not present

## 2016-08-03 DIAGNOSIS — I25810 Atherosclerosis of coronary artery bypass graft(s) without angina pectoris: Secondary | ICD-10-CM

## 2016-08-03 DIAGNOSIS — G4733 Obstructive sleep apnea (adult) (pediatric): Secondary | ICD-10-CM | POA: Diagnosis not present

## 2016-08-03 DIAGNOSIS — G3184 Mild cognitive impairment, so stated: Secondary | ICD-10-CM | POA: Diagnosis not present

## 2016-08-03 DIAGNOSIS — I2510 Atherosclerotic heart disease of native coronary artery without angina pectoris: Secondary | ICD-10-CM | POA: Diagnosis not present

## 2016-08-03 DIAGNOSIS — E1140 Type 2 diabetes mellitus with diabetic neuropathy, unspecified: Secondary | ICD-10-CM | POA: Diagnosis not present

## 2016-08-03 DIAGNOSIS — J449 Chronic obstructive pulmonary disease, unspecified: Secondary | ICD-10-CM | POA: Diagnosis not present

## 2016-08-03 DIAGNOSIS — Z483 Aftercare following surgery for neoplasm: Secondary | ICD-10-CM | POA: Diagnosis not present

## 2016-08-03 DIAGNOSIS — I5032 Chronic diastolic (congestive) heart failure: Secondary | ICD-10-CM | POA: Diagnosis not present

## 2016-08-03 DIAGNOSIS — M48061 Spinal stenosis, lumbar region without neurogenic claudication: Secondary | ICD-10-CM | POA: Diagnosis not present

## 2016-08-03 DIAGNOSIS — I110 Hypertensive heart disease with heart failure: Secondary | ICD-10-CM | POA: Diagnosis not present

## 2016-08-03 MED ORDER — NITROGLYCERIN 0.4 MG SL SUBL: 0.4000 mg | SUBLINGUAL_TABLET | SUBLINGUAL | 6 refills | Status: DC | PRN

## 2016-08-03 NOTE — Patient Instructions (Addendum)
Continue your current therapy  Resume your BiPAP therapy  We will arrange follow up with Dr. Claiborne Billings for your sleep apnea  I will see you in 6 months.

## 2016-08-07 ENCOUNTER — Ambulatory Visit: Payer: Medicare HMO

## 2016-08-21 ENCOUNTER — Other Ambulatory Visit (INDEPENDENT_AMBULATORY_CARE_PROVIDER_SITE_OTHER): Payer: Medicare HMO | Admitting: *Deleted

## 2016-08-21 DIAGNOSIS — I359 Nonrheumatic aortic valve disorder, unspecified: Secondary | ICD-10-CM

## 2016-08-21 LAB — POCT INR: INR: 3.1

## 2016-09-02 ENCOUNTER — Emergency Department (HOSPITAL_COMMUNITY)
Admission: EM | Admit: 2016-09-02 | Discharge: 2016-09-02 | Disposition: A | Discharge status: Home / Self Care | Payer: No Typology Code available for payment source | Attending: Physician Assistant | Admitting: Physician Assistant

## 2016-09-02 ENCOUNTER — Emergency Department (HOSPITAL_COMMUNITY): Payer: No Typology Code available for payment source

## 2016-09-02 ENCOUNTER — Encounter (HOSPITAL_COMMUNITY): Payer: Self-pay | Admitting: Emergency Medicine

## 2016-09-02 DIAGNOSIS — I10 Essential (primary) hypertension: Secondary | ICD-10-CM | POA: Diagnosis not present

## 2016-09-02 DIAGNOSIS — E119 Type 2 diabetes mellitus without complications: Secondary | ICD-10-CM | POA: Diagnosis not present

## 2016-09-02 DIAGNOSIS — Z951 Presence of aortocoronary bypass graft: Secondary | ICD-10-CM | POA: Diagnosis not present

## 2016-09-02 DIAGNOSIS — S8992XA Unspecified injury of left lower leg, initial encounter: Secondary | ICD-10-CM | POA: Diagnosis not present

## 2016-09-02 DIAGNOSIS — Z87891 Personal history of nicotine dependence: Secondary | ICD-10-CM | POA: Insufficient documentation

## 2016-09-02 DIAGNOSIS — I2510 Atherosclerotic heart disease of native coronary artery without angina pectoris: Secondary | ICD-10-CM | POA: Diagnosis not present

## 2016-09-02 DIAGNOSIS — S79911A Unspecified injury of right hip, initial encounter: Secondary | ICD-10-CM | POA: Diagnosis not present

## 2016-09-02 DIAGNOSIS — Y939 Activity, unspecified: Secondary | ICD-10-CM | POA: Insufficient documentation

## 2016-09-02 DIAGNOSIS — J449 Chronic obstructive pulmonary disease, unspecified: Secondary | ICD-10-CM | POA: Insufficient documentation

## 2016-09-02 DIAGNOSIS — Y92410 Unspecified street and highway as the place of occurrence of the external cause: Secondary | ICD-10-CM | POA: Diagnosis not present

## 2016-09-02 DIAGNOSIS — M25551 Pain in right hip: Secondary | ICD-10-CM | POA: Diagnosis not present

## 2016-09-02 DIAGNOSIS — Z7901 Long term (current) use of anticoagulants: Secondary | ICD-10-CM | POA: Insufficient documentation

## 2016-09-02 DIAGNOSIS — S8991XA Unspecified injury of right lower leg, initial encounter: Secondary | ICD-10-CM | POA: Insufficient documentation

## 2016-09-02 DIAGNOSIS — Y999 Unspecified external cause status: Secondary | ICD-10-CM | POA: Insufficient documentation

## 2016-09-02 DIAGNOSIS — Z79899 Other long term (current) drug therapy: Secondary | ICD-10-CM | POA: Insufficient documentation

## 2016-09-02 DIAGNOSIS — M25561 Pain in right knee: Secondary | ICD-10-CM | POA: Diagnosis not present

## 2016-09-02 DIAGNOSIS — M25562 Pain in left knee: Secondary | ICD-10-CM | POA: Diagnosis not present

## 2016-09-02 MED ORDER — ACETAMINOPHEN 325 MG PO TABS
650.0000 mg | ORAL_TABLET | Freq: Once | ORAL | Status: AC
  Administered 2016-09-02: 650 mg via ORAL
  Filled 2016-09-02: qty 2
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
## 2016-09-02 NOTE — ED Notes (Signed)
Pt now additionally reporting right hip soreness but is able to stand and "see if I'm sore anywhere else."

## 2016-09-04 ENCOUNTER — Ambulatory Visit: Payer: Medicare HMO

## 2016-09-05 ENCOUNTER — Telehealth: Payer: Self-pay | Admitting: Family Medicine

## 2016-09-05 NOTE — Telephone Encounter (Signed)
**  After Hours/ Emergency Line Call*  Received a page to call [name]. No answer. Left voicemail.   Carlyle Dolly, MD PGY-2, [facility]

## 2016-09-07 ENCOUNTER — Encounter: Payer: Self-pay | Admitting: Family Medicine

## 2016-09-07 ENCOUNTER — Ambulatory Visit (INDEPENDENT_AMBULATORY_CARE_PROVIDER_SITE_OTHER): Payer: Self-pay | Admitting: Family Medicine

## 2016-09-07 VITALS — BP 132/60 | HR 70 | Temp 98.1°F | Ht 69.0 in | Wt 247.0 lb

## 2016-09-07 DIAGNOSIS — Z7901 Long term (current) use of anticoagulants: Secondary | ICD-10-CM

## 2016-09-07 DIAGNOSIS — M25561 Pain in right knee: Secondary | ICD-10-CM

## 2016-09-07 DIAGNOSIS — G8929 Other chronic pain: Secondary | ICD-10-CM

## 2016-09-07 DIAGNOSIS — E118 Type 2 diabetes mellitus with unspecified complications: Secondary | ICD-10-CM

## 2016-09-07 DIAGNOSIS — M545 Low back pain: Secondary | ICD-10-CM

## 2016-09-07 DIAGNOSIS — M25562 Pain in left knee: Secondary | ICD-10-CM

## 2016-09-07 LAB — POCT GLYCOSYLATED HEMOGLOBIN (HGB A1C): Hemoglobin A1C: 7

## 2016-09-07 LAB — POCT INR: INR: 2.2

## 2016-09-07 MED ORDER — METHYLPREDNISOLONE ACETATE 40 MG/ML IJ SUSP
40.0000 mg | Freq: Once | INTRAMUSCULAR | Status: AC
  Administered 2016-09-07: 40 mg via INTRAMUSCULAR

## 2016-09-07 NOTE — Progress Notes (Signed)
Procedure note  INJECTION: Patient was given informed consent, signed copy in the chart. Appropriate time out was taken. Area prepped and draped in usual sterile fashion. 1 cc of methylprednisolone 40 mg/ml plus  4 cc of 1% lidocaine without epinephrine was injected into the R knee using a(n) anterior medial approach. The patient tolerated the procedure well. There were no complications. Post procedure instructions were given.  Virginia Crews, MD, MPH PGY-3,  [facility] 09/07/2016 5:01 PM
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
## 2016-09-08 DIAGNOSIS — G8929 Other chronic pain: Secondary | ICD-10-CM | POA: Insufficient documentation

## 2016-09-08 DIAGNOSIS — M25561 Pain in right knee: Secondary | ICD-10-CM | POA: Insufficient documentation

## 2016-09-08 DIAGNOSIS — M25562 Pain in left knee: Principal | ICD-10-CM

## 2016-09-08 DIAGNOSIS — M545 Low back pain: Secondary | ICD-10-CM
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
## 2016-09-16 ENCOUNTER — Other Ambulatory Visit: Payer: Self-pay | Admitting: *Deleted

## 2016-09-16 DIAGNOSIS — J42 Unspecified chronic bronchitis: Secondary | ICD-10-CM

## 2016-09-16 NOTE — Telephone Encounter (Signed)
Daughter wants Jazmin to call her today, states she was supposed to be called back by 3pm. ep

## 2016-09-16 NOTE — Telephone Encounter (Signed)
Daughter called and states that patient now has insurance with a high deductible is unable to afford his inhalers.  He has been out for more than a week and also has been having some trouble due to his allergies too.  Ms. Edler would like to know if there is anything on the [facility] $4 list that patient can get or another inhaler that works the same for cheap.  Their current rescue inhaler was going to cost them $290 for the symbicort and mid $100s for the rescue.  Will forward to MD to advise on if patient can try a different brand. Matthew Edwards,CMA

## 2016-09-16 NOTE — Telephone Encounter (Signed)
Returned daughter's phone called regarding the cost of patient's inhalers.  LM for her to call back.  Micky Overturf,CMA

## 2016-09-16 NOTE — Telephone Encounter (Signed)
Will forward to Dr. Valentina Lucks. Katherina [facility]

## 2016-09-17 MED ORDER — FLUTICASONE FUROATE-VILANTEROL 200-25 MCG/INH IN AEPB: 1.0000 | INHALATION_SPRAY | Freq: Every day | RESPIRATORY_TRACT | 0 refills | Status: DC

## 2016-09-17 NOTE — Telephone Encounter (Signed)
Will transition from Symbicort to [facility] (samples to be provided) until patient can afford the copay + deductible for Symbicort under his medicare plan per Dr Kennon Rounds.  Called patients daughter and notified of changes.  Patients daughter will pick up from clinic tomorrow and pharmacist will provide instruction on proper administration of Ellipta device.  Medication Samples have been provided to the patients daughter (left in front of clinic for pickup)  Drug name: Di Kindle       Strength: 200/25        Qty: 2 LOT: [id number] Exp.Date: 06/2017  Dosing instructions: 1 puff once daily  The patient/daughter will be instructed regarding the correct time, dose, and frequency of taking this medication, including desired effects and most common side effects upon pickup of sample.   Andi Devon Combs 2:48 PM 09/17/2016  Bennye Alm, PharmD, [place] PGY2 Pharmacy Resident [phone]
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
## 2016-09-23 ENCOUNTER — Encounter: Payer: Self-pay | Admitting: Family Medicine

## 2016-09-23 ENCOUNTER — Ambulatory Visit (INDEPENDENT_AMBULATORY_CARE_PROVIDER_SITE_OTHER): Payer: Medicare HMO | Admitting: Family Medicine

## 2016-09-23 DIAGNOSIS — M7711 Lateral epicondylitis, right elbow: Secondary | ICD-10-CM | POA: Diagnosis not present
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
## 2016-09-23 NOTE — Patient Instructions (Addendum)
It was nice meeting you today!  You were seen in clinic for right elbow pain which is likely something called tennis elbow.  You can take Tylenol as needed for the pain and apply ice  to the elbow for 20 minutes about 2-3 times a day.  In addition, wear the elbow brace you have at home to support the elbow joint.  I expect your symptoms will improve but if they continue to persist, we can consider physical therapy as we had discussed.  You can call clinic with any questions.   Lovenia Kim, MD    Tennis Elbow Tennis elbow is puffiness (inflammation) of the outer tendons of your forearm close to your elbow. Your tendons attach your muscles to your bones. Tennis elbow can happen in any sport or job in which you use your elbow too much. It is caused by doing the same motion over and over. Tennis elbow can cause:  Pain and tenderness in your forearm and the outer part of your elbow.  A burning feeling. This runs from your elbow through your arm.  Weak grip in your hands. Follow these instructions at home: Activity   Rest your elbow and wrist as told by your doctor. Try to avoid any activities that caused the problem until your doctor says that you can do them again.  If a physical therapist teaches you exercises, do all of them as told.  If you lift an object, lift it with your palm facing up. This is easier on your elbow. Lifestyle   If your tennis elbow is caused by sports, check your equipment and make sure that:  You are using it correctly.  It fits you well.  If your tennis elbow is caused by work, take breaks often, if you are able. Talk with your manager about doing your work in a way that is safe for you.  If your tennis elbow is caused by computer use, talk with your manager about any changes that can be made to your work setup. General instructions   If told, apply ice to the painful area:  Put ice in a plastic bag.  Place a towel between your skin and the bag.  Leave  the ice on for 20 minutes, 2-3 times per day.  Take medicines only as told by your doctor.  If you were given a brace, wear it as told by your doctor.  Keep all follow-up visits as told by your doctor. This is important. Contact a doctor if:  Your pain does not get better with treatment.  Your pain gets worse.  You have weakness in your forearm, hand, or fingers.  You cannot feel your forearm, hand, or fingers. This information is not intended to replace advice given to you by your health care provider. Make sure you discuss any questions you have with your health care provider. Document Released: 11/05/2009 Document Revised: 01/16/2016 Document Reviewed: 05/14/2014 Elsevier Interactive Patient Education  2017 [employer].

## 2016-09-27 DIAGNOSIS — M7711 Lateral epicondylitis, right elbow: Secondary | ICD-10-CM | POA: Insufficient documentation

## 2016-09-27 NOTE — Assessment & Plan Note (Addendum)
With one day of R elbow pain and no history of injury/trauma to the site.  No red flags on exam.  Exam findings consistent with likely tennis elbow.  Patient has not tried anti-inflammatories or ice at home.  - Advised to avoid NSAID use in light of history of CAD -Recommend Tylenol as needed for pain -Ice 20 min at a time 2-3 times a day -Elbow brace to support the joint.  Patient states he has one at home but has not tried using it.  -Consider PT if no improvement with these measures -Follow up : 2-3 weeks

## 2016-10-05 ENCOUNTER — Ambulatory Visit: Payer: Medicare HMO

## 2016-10-06 ENCOUNTER — Ambulatory Visit: Payer: Medicare HMO | Admitting: Sports Medicine

## 2016-10-30 ENCOUNTER — Ambulatory Visit (INDEPENDENT_AMBULATORY_CARE_PROVIDER_SITE_OTHER): Payer: Medicare HMO | Admitting: *Deleted

## 2016-10-30 ENCOUNTER — Telehealth: Payer: Self-pay

## 2016-10-30 ENCOUNTER — Other Ambulatory Visit: Payer: Self-pay | Admitting: Family Medicine

## 2016-10-30 DIAGNOSIS — R791 Abnormal coagulation profile: Secondary | ICD-10-CM | POA: Insufficient documentation

## 2016-10-30 DIAGNOSIS — I359 Nonrheumatic aortic valve disorder, unspecified: Secondary | ICD-10-CM

## 2016-10-30 DIAGNOSIS — I482 Chronic atrial fibrillation, unspecified: Secondary | ICD-10-CM

## 2016-10-30 DIAGNOSIS — Z7901 Long term (current) use of anticoagulants: Secondary | ICD-10-CM

## 2016-10-30 LAB — POCT INR: INR: >5

## 2016-10-30 MED ORDER — PHYTONADIONE 5 MG PO TABS: 2.5000 mg | ORAL_TABLET | Freq: Once | ORAL | 0 refills | Status: AC

## 2016-10-30 NOTE — Progress Notes (Signed)
Critical result reported to Dr Brita Romp at 1400 on 10/30/16. STAT protime sent to [facility] for confirmation.Evanny Ellerbe, Kevin Fenton

## 2016-10-30 NOTE — Telephone Encounter (Signed)
Vitamin K has to be ordered. Should she feed her dad green leafy veggies?  How much?  What should they do?  Please call the daughter Abdirizak Richison at [phone].

## 2016-10-30 NOTE — Progress Notes (Signed)
POC INR 9.4. Discussed with Matthew Edwards. Patient without bleeding. Will give Vit K 2.5 mg. Patient has INR check in 3 days (over weekend).

## 2016-10-31 LAB — PROTIME-INR
INR: 9.4 (ref 0.8–1.2)
Prothrombin Time: 91.2 s — ABNORMAL HIGH (ref 9.1–12.0)

## 2016-11-02 ENCOUNTER — Ambulatory Visit (INDEPENDENT_AMBULATORY_CARE_PROVIDER_SITE_OTHER): Payer: Medicare HMO | Admitting: *Deleted

## 2016-11-02 DIAGNOSIS — I359 Nonrheumatic aortic valve disorder, unspecified: Secondary | ICD-10-CM

## 2016-11-02 LAB — POCT INR: INR: 2.7

## 2016-11-02 NOTE — Telephone Encounter (Signed)
I spoke with Matthew Edwards in lab. He spoke with daughter on Friday 6/1 and described in detail how to take Vit K and what her father should eat. Patient scheduled to return today for check of INR.

## 2016-11-04 LAB — PROTIME-INR

## 2016-11-09 ENCOUNTER — Ambulatory Visit (INDEPENDENT_AMBULATORY_CARE_PROVIDER_SITE_OTHER): Payer: Medicare HMO | Admitting: *Deleted

## 2016-11-09 DIAGNOSIS — I359 Nonrheumatic aortic valve disorder, unspecified: Secondary | ICD-10-CM

## 2016-11-09 LAB — POCT INR: INR: 1.5

## 2016-11-23 ENCOUNTER — Ambulatory Visit (INDEPENDENT_AMBULATORY_CARE_PROVIDER_SITE_OTHER): Payer: Medicare HMO | Admitting: *Deleted

## 2016-11-23 DIAGNOSIS — I359 Nonrheumatic aortic valve disorder, unspecified: Secondary | ICD-10-CM

## 2016-11-23 LAB — POCT INR: INR: 1.3

## 2016-12-03 ENCOUNTER — Encounter: Payer: Self-pay | Admitting: Family Medicine

## 2016-12-03 ENCOUNTER — Ambulatory Visit (INDEPENDENT_AMBULATORY_CARE_PROVIDER_SITE_OTHER): Payer: Medicare HMO | Admitting: Family Medicine

## 2016-12-03 VITALS — BP 146/83 | HR 63 | Temp 98.6°F | Wt 245.0 lb

## 2016-12-03 DIAGNOSIS — I482 Chronic atrial fibrillation, unspecified: Secondary | ICD-10-CM

## 2016-12-03 DIAGNOSIS — E118 Type 2 diabetes mellitus with unspecified complications: Secondary | ICD-10-CM

## 2016-12-03 DIAGNOSIS — R151 Fecal smearing: Secondary | ICD-10-CM

## 2016-12-03 LAB — POCT GLYCOSYLATED HEMOGLOBIN (HGB A1C): Hemoglobin A1C: 7.5

## 2016-12-03 LAB — POCT INR: INR: 1.8

## 2016-12-03 MED ORDER — CALCIUM POLYCARBOPHIL 625 MG PO TABS: 625.0000 mg | ORAL_TABLET | Freq: Every day | ORAL | 3 refills | Status: DC

## 2016-12-03 NOTE — Progress Notes (Signed)
   Subjective:    Patient ID: Matthew Edwards is a 76 y.o. male presenting with No chief complaint on file.  on 12/03/2016  HPI: Here today for f/u States he is leaking stool from his rectum. Both liquid and solids are leaking. Previously with constipation and was taking some Miralax, which has worsened the problem. Has atrial fib and needs INR check. Due for A1C.  Review of Systems  Constitutional: Negative for chills and fever.  Respiratory: Negative for shortness of breath.   Cardiovascular: Negative for leg swelling.  Gastrointestinal: Negative for abdominal pain, nausea and vomiting.      Objective:    Pulse 63   Temp 98.6 F (37 C) (Oral)   Wt 245 lb (111.1 kg)   SpO2 93%   BMI 36.18 kg/m  Physical Exam  Constitutional: He appears well-developed and well-nourished. No distress.  HENT:  Head: Normocephalic and atraumatic.  Eyes: No scleral icterus.  Neck: Neck supple.  Cardiovascular: Normal rate.   Pulmonary/Chest: Effort normal.  Abdominal: Soft.  Musculoskeletal: He exhibits no edema.  Neurological: He is alert.  Skin: Skin is warm.  Psychiatric: He has a normal mood and affect.  Vitals reviewed.  INR 1.8   Hemoglobin A1C 7.5       Assessment & Plan:   Problem List Items Addressed This Visit      Unprioritized   Chronic atrial fibrillation (HCC) - Primary    Continue Coumadin change to 10 mg q Tuesday, 7.5 mg daily      Type 2 diabetes mellitus with complication, without long-term current use of insulin (HCC)    Continue Glipizide 10 mg. Will hold on adding metformin due to current GI issues A1C ok today. Needs foot and optho exam next visit      Relevant Orders   HgB A1c (Completed)    Other Visit Diagnoses    Fecal smearing       trial of bulking agent   Relevant Medications   polycarbophil (FIBERCON) 625 MG tablet       Total face-to-face time with patient: 15 minutes. Over 50% of encounter was spent on counseling and coordination of  care. Return in about 3 months (around 03/05/2017).  Donnamae Jude 12/03/2016 1:50 PM

## 2016-12-03 NOTE — Assessment & Plan Note (Addendum)
Continue Glipizide 10 mg. Will hold on adding metformin due to current GI issues A1C ok today. Needs foot and optho exam next visit

## 2016-12-03 NOTE — Patient Instructions (Addendum)
Stop taking any Miralax. Add fiber to your diet to work as a bulking agent and to allow less leakage.  Bleeding Precautions When on Anticoagulant Therapy WHAT IS ANTICOAGULANT THERAPY? Anticoagulant therapy is taking medicine to prevent or reduce blood clots. It is also called blood thinner therapy. Blood clots that form in your blood vessels can be dangerous. They can break loose and travel to your heart, lungs, or brain. This increases your risk of a heart attack or stroke. Anticoagulant therapy causes blood to clot more slowly. You may need anticoagulant therapy if you have:  A medical condition that increases the likelihood that blood clots will form.  A heart defect or a problem with heart rhythm. It is also a common treatment after heart surgery, such as valve replacement. WHAT ARE COMMON TYPES OF ANTICOAGULANT THERAPY? Anticoagulant medicine can be injected or taken by mouth.If you need anticoagulant therapy quickly at the hospital, the medicine may be injected under your skin or given through an IV tube. Heparin is a common example of an anticoagulant that you may get at the hospital. Most anticoagulant therapy is in the form of pills that you take at home every day. These may include:  Aspirin. This common blood thinner works by preventing blood cells (platelets) from sticking together to form a clot. Aspirin is not as strong as anticoagulants that slow down the time that it takes for your body to form a clot.  Clopidogrel. This is a newer type of drug that affects platelets. It is stronger than aspirin.  Warfarin. This is the most common anticoagulant. It changes the way your body uses vitamin K, a vitamin that helps your blood to clot. The risk of bleeding is higher with warfarin than with aspirin. You will need frequent blood tests to make sure you are taking the safest amount.  New anticoagulants. Several new drugs have been approved. They are all taken by mouth. Studies show that  these drugs work as well as warfarin. They do not require blood testing. They may cause less bleeding risk than warfarin. WHAT DO I NEED TO REMEMBER WHEN TAKING ANTICOAGULANT THERAPY? Anticoagulant therapy decreases your risk of forming a blood clot, but it increases your risk of bleeding. Work closely with your health care provider to make sure you are taking your medicine safely. These tips can help:  Learn ways to reduce your risk of bleeding.  If you are taking warfarin: ? Have blood tests as ordered by your health care provider. ? Do not make any sudden changes to your diet. Vitamin K in your diet can make warfarin less effective. ? Do not get pregnant. This medicine may cause birth defects.  Take your medicine at the same time every day. If you forget to take your medicine, take it as soon as you remember. If you miss a whole day, do not double your dose of medicine. Take your normal dose and call your health care provider to check in.  Do not stop taking your medicine on your own.  Tell your health care provider before you start taking any new medicine, vitamin, or herbal product. Some of these could interfere with your therapy.  Tell all of your health care providers that you are on anticoagulant therapy.  Do not have surgery, medical procedures, or dental work until you tell your health care provider that you are on anticoagulant therapy. WHAT CAN AFFECT HOW ANTICOAGULANTS WORK? Certain foods, vitamins, medicines, supplements, and herbal medicines change the way that anticoagulant  therapy works. They may increase or decrease the effects of your anticoagulant therapy. Either result can be dangerous for you.  Many over-the-counter medicines for pain, colds, or stomach problems interfere with anticoagulant therapy. Take these only as told by your health care provider.  Do not drink alcohol. It can interfere with your medicine and increase your risk of an injury that causes  bleeding.  If you are taking warfarin, do not begin eating more foods that contain vitamin K. These include leafy green vegetables. Ask your health care provider if you should avoid any foods. WHAT ARE SOME WAYS TO PREVENT BLEEDING? You can prevent bleeding by taking certain precautions:  Be extra careful when you use knives, scissors, or other sharp objects.  Use an electric razor instead of a blade.  Do not use toothpicks.  Use a soft toothbrush.  Wear shoes that have nonskid soles.  Use bath mats and handrails in your bathroom.  Wear gloves while you do yard work.  Wear a helmet when you ride a bike.  Wear your seat belt.  Prevent falls by removing loose rugs and extension cords from areas where you walk.  Do not play contact sports or participate in other activities that have a high risk of injury. [place] PROVIDER? Call your health care provider if:  You miss a dose of medicine: ? And you are not sure what to do. ? For more than one day.  You have: ? Menstrual bleeding that is heavier than normal. ? Blood in your urine. ? A bloody nose or bleeding gums. ? Easy bruising. ? Blood in your stool (feces) or have black and tarry stool. ? Side effects from your medicine.  You feel weak or dizzy.  You become pregnant. Seek immediate medical care if:  You have bleeding that will not stop.  You have sudden and severe headache or belly pain.  You vomit or you cough up bright red blood.  You have a severe blow to your head. WHAT ARE SOME QUESTIONS TO ASK MY HEALTH CARE PROVIDER?  What is the best anticoagulant therapy for my condition?  What side effects should I watch for?  When should I take my medicine? What should I do if I forget to take it?  Will I need to have regular blood tests?  Do I need to change my diet? Are there foods or drinks that I should avoid?  What activities are safe for me?  What should I do if I want to  get pregnant? This information is not intended to replace advice given to you by your health care provider. Make sure you discuss any questions you have with your health care provider. Document Released: 04/29/2015 Document Reviewed: 04/29/2015 Elsevier Interactive Patient Education  2017 [place].  Fecal Incontinence Fecal incontinence, also called accidental bowel leakage, is not being able to control your bowels. This condition happens because the nerves or muscles around the anus do not work the way they should. This affects their ability to hold stool. What are the causes? This condition may be caused by:  Damage to the muscles at the end of the rectum (sphincter).  Damage to the nerves that control bowel movements.  Diarrhea.  Chronic constipation.  Pelvic floor dysfunction. This means the muscles in the pelvis do not work well.  Loss of bowel storage capacity.  What increases the risk? This condition is more likely to develop in people who:  Are born with  bowels or a pelvis that has not formed correctly.  Have had rectal surgery.  Have had radiation treatment for certain cancers.  Have irritable bowel syndrome (IBS).  Have an inflammatory bowel disease (IBD), such as Crohn disease.  Have been pregnant, had a vaginal delivery, or had surgery that damaged the pelvic floor muscles.  Have a complicated childbirth, spinal cord injury, or other trauma that causes nerve damage.  Have a condition that can affect nerve function, such as diabetes, Parkinson disease, or multiple sclerosis.  Have a condition where the rectum drops down into the anus or vagina (prolapse).  Are older.  What are the signs or symptoms? The main symptom of this condition is not being able to control your bowels. You also might not be able get to the bathroom before a bowel movement. How is this diagnosed? This condition is diagnosed with a medical history and physical exam. You may also  have tests, including:  Blood tests.  Urine tests.  A rectal exam.  Ultrasound.  MRI.  Colonoscopy. This is an exam that looks at your large intestine (colon).  Anal manometry. This is a test that measures the strength of the anal sphincter.  Anal electromyogram (EMG). This is a test that uses small electrodes to check for nerve damage.  How is this treated? Treatment varies depending on the cause and severity of your condition. Treatment may also focus on addressing any underlying causes of this condition. Treatment may include:  Medicines. This may include medicines to: ? Prevent diarrhea. ? Help with constipation (laxatives). ? Treat any underlying conditions.  Physical therapy.  Fiber supplements. These can help manage your bowel movements.  Nerve stimulation.  Injectable gel to promote tissue growth and better muscle control.  Surgery. You may need: ? Sphincter repair surgery. ? Diversion surgery. This procedure lets feces pass out of your body through a hole in your abdomen.  Follow these instructions at home: Diet  Follow instructions from your health care provider about any eating or drinking restrictions. Work with a dietitian to come up with a healthy diet and to help you avoid the foods that can make your condition worse. Keep a diet diary to find out which foods or drinks could be making your fecal incontinence worse.  Drink enough fluid to keep your urine clear or pale yellow. Lifestyle  If you smoke, talk to your health care provider about quitting. This may help your condition.  If you are overweight, talk to your health care provider about how to safely lose weight. This may help your condition.  Increase your physical activity as told by your health care provider. This may help your condition. Always talk to your health care provider before starting a new exercise program.  Carry a change of clothes and supplies to clean up quickly if you have an  episode of fetal incontinence.  Consider joining a fecal incontinence support group. You can find a support group online or in your local community. General instructions  Take over-the-counter and prescription medicines only as told by your health care provider. This includes any supplements.  Apply a moisture barrier, such as petroleum jelly, to your rectum. This protects the skin from irritation caused by ongoing leaking or diarrhea.  Tell your health care provider if you are upset or depressed about your condition. Where to find more information: American Academy of Family Physicians: www.AromatherapyParty.no International Foundation for Functional Gastrointestinal Disorders: www.iffgd.org Contact a health care provider if:  You have a  fever.  You have redness, swelling, or pain around your rectum.  Your pain is getting worse or you lose feeling in your rectal area.  You have blood in your stool.  You feel sad or hopeless.  You avoid social or work situations. Get help right away if:  You stop having bowel movements.  You cannot eat or drink without vomiting.  You have rectal bleeding that does not stop.  You have severe pain that is getting worse.  You have symptoms of dehydration, including: ? Sleepiness or fatigue. ? Producing little or no urine, tears, or sweat. ? Dizziness. ? Dry mouth. ? Unusual irritability. ? Headache. ? Inability to think clearly. This information is not intended to replace advice given to you by your health care provider. Make sure you discuss any questions you have with your health care provider. Document Released: 04/29/2004 Document Revised: 10/24/2015 Document Reviewed: 10/24/2014 Elsevier Interactive Patient Education  [employer].

## 2016-12-03 NOTE — Assessment & Plan Note (Addendum)
Continue Coumadin change to 10 mg q Tuesday, 7.5 mg daily

## 2016-12-11 ENCOUNTER — Ambulatory Visit (INDEPENDENT_AMBULATORY_CARE_PROVIDER_SITE_OTHER): Payer: Medicare HMO | Admitting: *Deleted

## 2016-12-11 DIAGNOSIS — I359 Nonrheumatic aortic valve disorder, unspecified: Secondary | ICD-10-CM | POA: Diagnosis not present

## 2016-12-11 LAB — POCT INR: INR: 2.5

## 2016-12-18 ENCOUNTER — Ambulatory Visit (INDEPENDENT_AMBULATORY_CARE_PROVIDER_SITE_OTHER): Payer: Medicare HMO | Admitting: *Deleted

## 2016-12-18 DIAGNOSIS — Z7901 Long term (current) use of anticoagulants: Secondary | ICD-10-CM | POA: Diagnosis not present

## 2016-12-18 LAB — POCT INR: INR: 2.6

## 2016-12-21 ENCOUNTER — Telehealth: Payer: Self-pay | Admitting: Gastroenterology

## 2016-12-21 NOTE — Telephone Encounter (Signed)
Please advise, patient is also on coumadin. Thanks.

## 2016-12-22 NOTE — Telephone Encounter (Signed)
Please have the patient come see me in clinic in Sept or Oct. We will plan his surveillance colonoscopy at the hospital endoscopy lab at that time.

## 2016-12-22 NOTE — Telephone Encounter (Signed)
Spoke to daughter, patient is having a lot of difficulty with fecal incontinence. Wanted to come see Dr. Loletha Carrow sooner.

## 2016-12-29 ENCOUNTER — Other Ambulatory Visit: Payer: Self-pay | Admitting: Family Medicine

## 2016-12-29 DIAGNOSIS — I482 Chronic atrial fibrillation, unspecified: Secondary | ICD-10-CM

## 2016-12-29 MED ORDER — WARFARIN SODIUM 5 MG PO TABS: 7.5000 mg | ORAL_TABLET | ORAL | 2 refills | Status: DC

## 2016-12-29 NOTE — Telephone Encounter (Signed)
Needs refill on warfarin. Please send to [facility] on [address].  [facility] states they have faxed prescriptions over for over a week with no response.

## 2016-12-29 NOTE — Telephone Encounter (Signed)
Daughter says they only have 2 pills left and she wants this done by 5pm tomorrow.  She states dr Andria Frames and dr Mingo Amber have renewed the RX before

## 2017-01-01 ENCOUNTER — Telehealth: Payer: Self-pay | Admitting: Family Medicine

## 2017-01-01 ENCOUNTER — Other Ambulatory Visit: Payer: Self-pay | Admitting: Family Medicine

## 2017-01-01 DIAGNOSIS — I482 Chronic atrial fibrillation, unspecified: Secondary | ICD-10-CM

## 2017-01-01 MED ORDER — WARFARIN SODIUM 5 MG PO TABS: 7.5000 mg | ORAL_TABLET | ORAL | 2 refills | Status: DC

## 2017-01-01 NOTE — Telephone Encounter (Signed)
Warfarin has been called into [facility] on [address].

## 2017-01-01 NOTE — Telephone Encounter (Signed)
Attempted to contact pt and inform of this, however, the number provided rang and rang, with no option for VM.

## 2017-01-01 NOTE — Telephone Encounter (Signed)
Daughter says she just left pharmacy and they dont have the warfarin. Please call it back in to the [facility] at [facility]

## 2017-01-01 NOTE — Telephone Encounter (Signed)
Has been called into pharmacy.

## 2017-01-01 NOTE — Addendum Note (Signed)
Addended by: Derl Barrow on: 01/01/2017 03:02 PM   Modules accepted: Orders

## 2017-01-01 NOTE — Telephone Encounter (Signed)
Patient says he has needed his Coumadin for 2 weeks.  He wanted me to call pharmacies to find out and I told him he can use our phone up front but he just said forget it and left.  Please call patient to discuss.

## 2017-01-07 ENCOUNTER — Ambulatory Visit (INDEPENDENT_AMBULATORY_CARE_PROVIDER_SITE_OTHER): Payer: Medicare HMO | Admitting: *Deleted

## 2017-01-07 DIAGNOSIS — Z7901 Long term (current) use of anticoagulants: Secondary | ICD-10-CM

## 2017-01-07 LAB — POCT INR: INR: 2.3

## 2017-01-08 ENCOUNTER — Ambulatory Visit: Payer: Medicare HMO

## 2017-01-22 ENCOUNTER — Ambulatory Visit (INDEPENDENT_AMBULATORY_CARE_PROVIDER_SITE_OTHER): Payer: Medicare HMO | Admitting: Gastroenterology

## 2017-01-22 ENCOUNTER — Encounter: Payer: Self-pay | Admitting: Gastroenterology

## 2017-01-22 VITALS — BP 110/60 | HR 82 | Ht 69.0 in | Wt 252.0 lb

## 2017-01-22 DIAGNOSIS — K529 Noninfective gastroenteritis and colitis, unspecified: Secondary | ICD-10-CM | POA: Diagnosis not present

## 2017-01-22 DIAGNOSIS — Z9889 Other specified postprocedural states: Secondary | ICD-10-CM | POA: Diagnosis not present

## 2017-01-22 DIAGNOSIS — R159 Full incontinence of feces: Secondary | ICD-10-CM | POA: Diagnosis not present

## 2017-01-22 DIAGNOSIS — Z8719 Personal history of other diseases of the digestive system: Secondary | ICD-10-CM | POA: Diagnosis not present

## 2017-01-22 MED ORDER — NA SULFATE-K SULFATE-MG SULF 17.5-3.13-1.6 GM/177ML PO SOLN: 1.0000 | Freq: Once | ORAL | 0 refills | Status: AC

## 2017-01-22 NOTE — Progress Notes (Addendum)
[facility] Progress Note  Chief Complaint: Fecal incontinence and diarrhea  Subjective  History:  This is a 76 year old man I last saw in October 2017 for a colonoscopy working up constipation. 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 He underwent a transanal excision that extended down into the anal verge based on the operative report from December 2017. Pathology showed that it was a high-grade dysplastic polyp and was completely removed.  Today is the first time I've see Mr. Elwood since his colonoscopy with me. He is accompanied by one of his daughters, who was visibly angry throughout the entire encounter today. She tells me that ever since the surgery, her father has been incontinent of semi-formed to loose stool multiple times a day. He has had to wear diapers which they change many times a day, he is unable to go anywhere because of this, and she is extremely frustrated with the lack of answers. They have seen Dr. Johney Maine since the surgery, and the patient's daughter felt that this issue was not given sufficient attention. She also does not feel that they were made aware of the possibility of this kind of postoperative problem before they planned at the surgery.  Mr. Mcnerney has seen primary care since then, and I reviewed that note from both April and July. As near as I can tell, Mr. Tavano denies abdominal pain, nausea or vomiting, and his appetite is reportedly good. However, his daughter states that they restrict his food when he has to go somewhere so that he will not have incontinence.  ROS: Unable to obtain remainder systems due to the patient's dementia. As near as I can tell, he does not seem to be having chest pain or dyspnea or abdominal pain.   The patient's Past Medical, Family and Social History were reviewed and are on file  in the EMR.  Objective:  Med list reviewed  Vital signs in last 24 hrs: Vitals:   01/22/17 1421  BP: 110/60  Pulse: 82    Physical Exam  Morbidly obese elderly man, able to get on the exam table with assistance.  HEENT: sclera anicteric, oral mucosa moist without lesions  Neck: supple, no thyromegaly, JVD or lymphadenopathy  Cardiac: RRR with soft systolic murmur, O6V6 heard, mild peripheral edema  Pulm: clear to auscultation bilaterally, normal RR and effort noted  Abdomen: soft, no tenderness, with active bowel sounds. No guarding or palpable hepatosplenomegaly.  Skin; warm and dry, no jaundice or rash Rectal (chaperoned by our MA Toni): Mildly decreased resting and voluntary sphincter tone, he has palpable postsurgical changes from the 3 to 6:00 position  Recent Labs:  No stool studies done No recent labs   @ASSESSMENTPLANBEGIN @ Assessment: Encounter Diagnoses  Name Primary?  . Full incontinence of feces   . Chronic diarrhea Yes  . History of rectal polypectomy    He definitely has incontinence that I believe is related to postsurgical anal sphincter dysfunction. It is not clear if he is truly having loose stool, as it seems unlikely he would've developed some new digestive condition causing diarrhea at this point. I think the problem is made worse by his dementia and poor mobility, so he probably also has altered anorectal sensation and cannot get to the bathroom even if he does feel the need for a BM.  They saw me today  to discuss this frustrating problem and to get scheduled for repeat colonoscopy. I told him that he needs the colonoscopy mostly for polyp surveillance, and that I certainly could biopsy look for microscopic colitis but I doubt I will identify a cause for diarrhea, if in fact that is truly what he is having. He is to be off Coumadin several days prior, and we will discuss this with his cardiologist. I reviewed their last note from March, and the  patient did not need bridging Lovenox prior to his December 2017 surgery.  Due to this patient's multiple medical issues and debility, I am planning to do the procedure in the hospital endoscopy lab. Again, I found it difficult to communicate with his daughter today due to her demeanor. My office staff was preparing the instructions for the colonoscopy scheduling an preparation when the patient's daughter brought him out of the room and said they would leave him because his blood sugar was low. Offers to bring him food or glucose tablets were declined. They left the office with the daughter stating "mail me the instructions".  I will certainly communicate all this to Dr. Johney Maine so he is aware of the issues at hand, but I don't know what he may have to offer for this. Mr. Tomaso is a complicated patient who had a very strong indication for polyp excision, and I did my best to reassure them of that.  Stool for C. difficile testing ordered.  Total time 30 minutes, over half spent in counseling and coordination of care.   Nelida Meuse III   CC: [name], MD [facility])

## 2017-01-22 NOTE — Patient Instructions (Signed)
If you are age 74 or older, your body mass index should be between 23-30. Your Body mass index is 37.21 kg/m. If this is out of the aforementioned range listed, please consider follow up with your Primary Care Provider.  If you are age 28 or younger, your body mass index should be between 19-25. Your Body mass index is 37.21 kg/m. If this is out of the aformentioned range listed, please consider follow up with your Primary Care Provider.   Your physician has requested that you go to the basement for the following lab work before leaving today:  please collect the kit at the lab to complete C. Diff stool testing.  You have been scheduled for a colonoscopy. Please follow written instructions given to you at your visit today.  Please pick up your prep supplies at the pharmacy within the next 1-3 days. If you use inhalers (even only as needed), please bring them with you on the day of your procedure.  You will be contaced by our office prior to your procedure for directions on holding your Coumadin/Warfarin.  If you do not hear from our office 1 week prior to your scheduled procedure, please call [phone] to discuss.  Thank you for choosing [facility]  Dr Wilfrid Lund III

## 2017-01-25 ENCOUNTER — Telehealth: Payer: Self-pay

## 2017-01-25 ENCOUNTER — Other Ambulatory Visit: Payer: Self-pay

## 2017-01-25 NOTE — Telephone Encounter (Signed)
Patient with diagnosis of atrial fibrillation on warfarin for anticoagulation.    Procedure: endoscopy Date of procedure: 02/08/17  CHADS2 score of 3 (CHF, HTN, DM2);  CHADS2-VASc score of  5 (CHF, HTN, DM2,CAD, AGE)  CrCl 58.7 Platelet count 210  Per office protocol, patient can hold warfarin for 5 days prior to procedure.   Patient will not need bridging with Lovenox (enoxaparin) around procedure.  If not bridging, patient should restart warfarin on the evening of procedure or day after, at discretion of procedure MD

## 2017-01-25 NOTE — Telephone Encounter (Signed)
  01/25/2017   RE: Matthew Edwards DOB: June 21, 1940 MRN: [id number]   Dear Erasmo Downer,    We have scheduled the above patient for an endoscopic procedure (colonoscopy) . Our records show that he is on anticoagulation therapy.   Please advise as to how long the patient may come off his therapy of coumadin prior to the procedure, which is scheduled for 02-08-2017.  Sincerely,   T.Keivon Garden,CMA

## 2017-01-25 NOTE — Telephone Encounter (Signed)
Understood, thanks

## 2017-01-25 NOTE — Telephone Encounter (Signed)
Contacted pt. Spoke to his daughter/caregiver Dione Housekeeper. She states clear understanding. No further questions from pt or Desha.

## 2017-02-03 ENCOUNTER — Ambulatory Visit: Payer: Medicare HMO | Admitting: Gastroenterology

## 2017-02-03 ENCOUNTER — Other Ambulatory Visit: Payer: Self-pay | Admitting: Family Medicine

## 2017-02-03 ENCOUNTER — Other Ambulatory Visit: Payer: Self-pay | Admitting: Family

## 2017-02-03 DIAGNOSIS — I10 Essential (primary) hypertension: Secondary | ICD-10-CM

## 2017-02-03 DIAGNOSIS — J42 Unspecified chronic bronchitis: Secondary | ICD-10-CM

## 2017-02-03 DIAGNOSIS — E0843 Diabetes mellitus due to underlying condition with diabetic autonomic (poly)neuropathy: Secondary | ICD-10-CM

## 2017-02-03 DIAGNOSIS — I482 Chronic atrial fibrillation, unspecified: Secondary | ICD-10-CM

## 2017-02-03 DIAGNOSIS — I2510 Atherosclerotic heart disease of native coronary artery without angina pectoris: Secondary | ICD-10-CM

## 2017-02-04 ENCOUNTER — Other Ambulatory Visit: Payer: Self-pay | Admitting: Family Medicine

## 2017-02-04 ENCOUNTER — Ambulatory Visit: Payer: Medicare HMO

## 2017-02-04 DIAGNOSIS — J42 Unspecified chronic bronchitis: Secondary | ICD-10-CM

## 2017-02-04 DIAGNOSIS — I10 Essential (primary) hypertension: Secondary | ICD-10-CM

## 2017-02-04 DIAGNOSIS — E0843 Diabetes mellitus due to underlying condition with diabetic autonomic (poly)neuropathy: Secondary | ICD-10-CM

## 2017-02-04 DIAGNOSIS — I482 Chronic atrial fibrillation, unspecified: Secondary | ICD-10-CM

## 2017-02-04 DIAGNOSIS — E559 Vitamin D deficiency, unspecified: Secondary | ICD-10-CM

## 2017-02-04 DIAGNOSIS — I2510 Atherosclerotic heart disease of native coronary artery without angina pectoris: Secondary | ICD-10-CM

## 2017-02-04 NOTE — Telephone Encounter (Signed)
[facility] sent request for meds (all) but patient cannot get them and they need refilling, for 90 days.  Call # [phone] w/questions.

## 2017-02-05 ENCOUNTER — Encounter (HOSPITAL_COMMUNITY): Payer: Self-pay | Admitting: *Deleted

## 2017-02-05 MED ORDER — GLIPIZIDE 10 MG PO TABS: ORAL_TABLET | ORAL | 3 refills | Status: DC

## 2017-02-05 MED ORDER — METOPROLOL SUCCINATE ER 25 MG PO TB24: 25.0000 mg | ORAL_TABLET | Freq: Every day | ORAL | 3 refills | Status: DC

## 2017-02-05 MED ORDER — POTASSIUM CHLORIDE CRYS ER 20 MEQ PO TBCR: 20.0000 meq | EXTENDED_RELEASE_TABLET | Freq: Every day | ORAL | 3 refills | Status: DC

## 2017-02-05 MED ORDER — DILTIAZEM HCL ER COATED BEADS 120 MG PO CP24: 120.0000 mg | ORAL_CAPSULE | Freq: Every day | ORAL | 3 refills | Status: DC

## 2017-02-05 MED ORDER — GLUCOSE BLOOD VI STRP: ORAL_STRIP | 3 refills | Status: DC

## 2017-02-05 MED ORDER — METFORMIN HCL 500 MG PO TABS: ORAL_TABLET | ORAL | 3 refills | Status: DC

## 2017-02-05 MED ORDER — FUROSEMIDE 40 MG PO TABS: 40.0000 mg | ORAL_TABLET | Freq: Two times a day (BID) | ORAL | 3 refills | Status: DC

## 2017-02-05 MED ORDER — POLYETHYLENE GLYCOL 3350 17 G PO PACK: 17.0000 g | PACK | ORAL | 6 refills | Status: DC

## 2017-02-05 MED ORDER — WARFARIN SODIUM 5 MG PO TABS: 5.0000 mg | ORAL_TABLET | Freq: Every day | ORAL | 3 refills | Status: DC

## 2017-02-05 MED ORDER — VITAMIN D (ERGOCALCIFEROL) 1.25 MG (50000 UNIT) PO CAPS: 50000.0000 [IU] | ORAL_CAPSULE | ORAL | 5 refills | Status: DC

## 2017-02-05 MED ORDER — ATORVASTATIN CALCIUM 40 MG PO TABS: ORAL_TABLET | ORAL | 3 refills | Status: DC

## 2017-02-05 MED ORDER — NITROGLYCERIN 0.4 MG SL SUBL: 0.4000 mg | SUBLINGUAL_TABLET | SUBLINGUAL | 6 refills | Status: DC | PRN

## 2017-02-05 MED ORDER — BUDESONIDE-FORMOTEROL FUMARATE 80-4.5 MCG/ACT IN AERO: 2.0000 | INHALATION_SPRAY | Freq: Two times a day (BID) | RESPIRATORY_TRACT | 11 refills | Status: DC

## 2017-02-05 MED ORDER — VITAMIN C 500 MG PO TABS: 500.0000 mg | ORAL_TABLET | Freq: Every morning | ORAL | 3 refills | Status: DC

## 2017-02-05 MED ORDER — BENAZEPRIL HCL 20 MG PO TABS: 20.0000 mg | ORAL_TABLET | Freq: Every day | ORAL | 3 refills | Status: DC

## 2017-02-08 ENCOUNTER — Ambulatory Visit (HOSPITAL_COMMUNITY)
Admission: RE | Admit: 2017-02-08 | Discharge: 2017-02-08 | Disposition: A | Discharge status: Home / Self Care | Payer: Medicare HMO | Source: Ambulatory Visit | Attending: Gastroenterology | Admitting: Gastroenterology

## 2017-02-08 ENCOUNTER — Ambulatory Visit (HOSPITAL_COMMUNITY): Payer: Medicare HMO | Admitting: Certified Registered Nurse Anesthetist

## 2017-02-08 ENCOUNTER — Encounter (HOSPITAL_COMMUNITY): Payer: Self-pay

## 2017-02-08 ENCOUNTER — Encounter (HOSPITAL_COMMUNITY)
Admission: RE | Disposition: A | Discharge status: Home / Self Care | Payer: Self-pay | Source: Ambulatory Visit | Attending: Gastroenterology

## 2017-02-08 DIAGNOSIS — Z8719 Personal history of other diseases of the digestive system: Secondary | ICD-10-CM

## 2017-02-08 DIAGNOSIS — M1990 Unspecified osteoarthritis, unspecified site: Secondary | ICD-10-CM | POA: Diagnosis not present

## 2017-02-08 DIAGNOSIS — D122 Benign neoplasm of ascending colon: Secondary | ICD-10-CM | POA: Diagnosis not present

## 2017-02-08 DIAGNOSIS — I110 Hypertensive heart disease with heart failure: Secondary | ICD-10-CM | POA: Insufficient documentation

## 2017-02-08 DIAGNOSIS — E119 Type 2 diabetes mellitus without complications: Secondary | ICD-10-CM | POA: Diagnosis not present

## 2017-02-08 DIAGNOSIS — K529 Noninfective gastroenteritis and colitis, unspecified: Secondary | ICD-10-CM

## 2017-02-08 DIAGNOSIS — G4730 Sleep apnea, unspecified: Secondary | ICD-10-CM | POA: Insufficient documentation

## 2017-02-08 DIAGNOSIS — Z87891 Personal history of nicotine dependence: Secondary | ICD-10-CM | POA: Insufficient documentation

## 2017-02-08 DIAGNOSIS — Z86010 Personal history of colonic polyps: Secondary | ICD-10-CM | POA: Diagnosis not present

## 2017-02-08 DIAGNOSIS — F329 Major depressive disorder, single episode, unspecified: Secondary | ICD-10-CM | POA: Diagnosis not present

## 2017-02-08 DIAGNOSIS — D123 Benign neoplasm of transverse colon: Secondary | ICD-10-CM | POA: Diagnosis not present

## 2017-02-08 DIAGNOSIS — K219 Gastro-esophageal reflux disease without esophagitis: Secondary | ICD-10-CM | POA: Insufficient documentation

## 2017-02-08 DIAGNOSIS — I4891 Unspecified atrial fibrillation: Secondary | ICD-10-CM | POA: Diagnosis not present

## 2017-02-08 DIAGNOSIS — R159 Full incontinence of feces: Secondary | ICD-10-CM | POA: Insufficient documentation

## 2017-02-08 DIAGNOSIS — E785 Hyperlipidemia, unspecified: Secondary | ICD-10-CM | POA: Diagnosis not present

## 2017-02-08 DIAGNOSIS — J449 Chronic obstructive pulmonary disease, unspecified: Secondary | ICD-10-CM | POA: Insufficient documentation

## 2017-02-08 DIAGNOSIS — Z6838 Body mass index (BMI) 38.0-38.9, adult: Secondary | ICD-10-CM | POA: Insufficient documentation

## 2017-02-08 DIAGNOSIS — Z951 Presence of aortocoronary bypass graft: Secondary | ICD-10-CM | POA: Insufficient documentation

## 2017-02-08 DIAGNOSIS — F0390 Unspecified dementia without behavioral disturbance: Secondary | ICD-10-CM | POA: Insufficient documentation

## 2017-02-08 DIAGNOSIS — Z9889 Other specified postprocedural states: Secondary | ICD-10-CM

## 2017-02-08 DIAGNOSIS — I509 Heart failure, unspecified: Secondary | ICD-10-CM | POA: Insufficient documentation

## 2017-02-08 DIAGNOSIS — R197 Diarrhea, unspecified: Secondary | ICD-10-CM | POA: Diagnosis not present

## 2017-02-08 HISTORY — DX: Unspecified dementia, unspecified severity, without behavioral disturbance, psychotic disturbance, mood disturbance, and anxiety: F03.90

## 2017-02-08 HISTORY — DX: Adverse effect of unspecified anesthetic, initial encounter: T41.45XA

## 2017-02-08 HISTORY — PX: COLONOSCOPY WITH PROPOFOL: SHX5780

## 2017-02-08 HISTORY — DX: Other complications of anesthesia, initial encounter: T88.59XA

## 2017-02-08 LAB — C DIFFICILE QUICK SCREEN W PCR REFLEX
C Diff antigen: NEGATIVE
C Diff interpretation: NOT DETECTED
C Diff toxin: NEGATIVE

## 2017-02-08 LAB — GLUCOSE, CAPILLARY: Glucose-Capillary: 163 mg/dL — ABNORMAL HIGH (ref 65–99)

## 2017-02-08 SURGERY — COLONOSCOPY WITH PROPOFOL
Anesthesia: Monitor Anesthesia Care

## 2017-02-08 MED ORDER — PROPOFOL 10 MG/ML IV BOLUS
INTRAVENOUS | Status: AC
  Filled 2017-02-08: qty 40

## 2017-02-08 MED ORDER — PROPOFOL 10 MG/ML IV BOLUS
INTRAVENOUS | Status: DC | PRN
  Administered 2017-02-08 (×2): 20 mg via INTRAVENOUS

## 2017-02-08 MED ORDER — SODIUM CHLORIDE 0.9 % IV SOLN: INTRAVENOUS | Status: DC

## 2017-02-08 MED ORDER — PROPOFOL 10 MG/ML IV BOLUS
INTRAVENOUS | Status: AC
  Filled 2017-02-08: qty 20

## 2017-02-08 MED ORDER — LIDOCAINE 2% (20 MG/ML) 5 ML SYRINGE
INTRAMUSCULAR | Status: DC | PRN
  Administered 2017-02-08: 100 mg via INTRAVENOUS

## 2017-02-08 MED ORDER — PROPOFOL 500 MG/50ML IV EMUL
INTRAVENOUS | Status: DC | PRN
  Administered 2017-02-08: 130 ug/kg/min via INTRAVENOUS

## 2017-02-08 MED ORDER — LACTATED RINGERS IV SOLN
INTRAVENOUS | Status: DC | PRN
  Administered 2017-02-08: 09:00:00 via INTRAVENOUS

## 2017-02-08 MED ORDER — LIDOCAINE 2% (20 MG/ML) 5 ML SYRINGE
INTRAMUSCULAR | Status: AC
  Filled 2017-02-08: qty 5

## 2017-02-08 SURGICAL SUPPLY — 21 items

## 2017-02-08 NOTE — Discharge Instructions (Signed)
YOU HAD AN ENDOSCOPIC PROCEDURE TODAY: Refer to the procedure report and other information in the discharge instructions given to you for any specific questions about what was found during the examination. If this information does not answer your questions, please call [place] office at [phone] to clarify.  ° °YOU SHOULD EXPECT: Some feelings of bloating in the abdomen. Passage of more gas than usual. Walking can help get rid of the air that was put into your GI tract during the procedure and reduce the bloating. If you had a lower endoscopy (such as a colonoscopy or flexible sigmoidoscopy) you may notice spotting of blood in your stool or on the toilet paper. Some abdominal soreness may be present for a day or two, also. ° °DIET: Your first meal following the procedure should be a light meal and then it is ok to progress to your normal diet. A half-sandwich or bowl of soup is an example of a good first meal. Heavy or fried foods are harder to digest and may make you feel nauseous or bloated. Drink plenty of fluids but you should avoid alcoholic beverages for 24 hours. If you had a esophageal dilation, please see attached instructions for diet.   ° °ACTIVITY: Your care partner should take you home directly after the procedure. You should plan to take it easy, moving slowly for the rest of the day. You can resume normal activity the day after the procedure however YOU SHOULD NOT DRIVE, use power tools, machinery or perform tasks that involve climbing or major physical exertion for 24 hours (because of the sedation medicines used during the test).  ° °SYMPTOMS TO REPORT IMMEDIATELY: °A gastroenterologist can be reached at any hour. Please call [phone]  for any of the following symptoms:  °Following lower endoscopy (colonoscopy, flexible sigmoidoscopy) °Excessive amounts of blood in the stool  °Significant tenderness, worsening of abdominal pains  °Swelling of the abdomen that is new, acute  °Fever of 100° or  higher  °Following upper endoscopy (EGD, EUS, ERCP, esophageal dilation) °Vomiting of blood or coffee ground material  °New, significant abdominal pain  °New, significant chest pain or pain under the shoulder blades  °Painful or persistently difficult swallowing  °New shortness of breath  °Black, tarry-looking or red, bloody stools ° °FOLLOW UP:  °If any biopsies were taken you will be contacted by phone or by letter within the next 1-3 weeks. Call [phone]  if you have not heard about the biopsies in 3 weeks.  °Please also call with any specific questions about appointments or follow up tests. ° °

## 2017-02-08 NOTE — Anesthesia Preprocedure Evaluation (Addendum)
Anesthesia Evaluation  Patient identified by MRN, date of birth, ID band Patient awake    Reviewed: Allergy & Precautions, H&P , NPO status , Patient's Chart, lab work & pertinent test results, reviewed documented beta blocker date and time   Airway Mallampati: III   Neck ROM: full    Dental   Pulmonary sleep apnea and Continuous Positive Airway Pressure Ventilation , COPD,  COPD inhaler, former smoker,    breath sounds clear to auscultation       Cardiovascular hypertension, Pt. on medications and Pt. on home beta blockers + CAD, + CABG and +CHF  + dysrhythmias Atrial Fibrillation + Valvular Problems/Murmurs (s/p AVR)  Rhythm:regular Rate:Normal  ECG: A-fib, rate 72  Sees cardiologist ([place])   S/p AVR and CABG 2012.   Neuro/Psych Depression Dementia    GI/Hepatic GERD  ,  Endo/Other  diabetes, Type 2, Oral Hypoglycemic Agents  Renal/GU      Musculoskeletal  (+) Arthritis ,   Abdominal (+) + obese,   Peds  Hematology   Anesthesia Other Findings Hyperlipidemia  Reproductive/Obstetrics                            Anesthesia Physical  Anesthesia Plan  ASA: III  Anesthesia Plan: MAC   Post-op Pain Management:    Induction: Intravenous  PONV Risk Score and Plan: 1 and Propofol infusion and Treatment may vary due to age or medical condition  Airway Management Planned: Natural Airway  Additional Equipment:   Intra-op Plan:   Post-operative Plan:   Informed Consent: I have reviewed the patients History and Physical, chart, labs and discussed the procedure including the risks, benefits and alternatives for the proposed anesthesia with the patient or authorized representative who has indicated his/her understanding and acceptance.     Plan Discussed with: CRNA  Anesthesia Plan Comments:         Anesthesia Quick Evaluation

## 2017-02-08 NOTE — Transfer of Care (Signed)
Immediate Anesthesia Transfer of Care Note  Patient: Matthew Edwards  Procedure(s) Performed: Procedure(s): COLONOSCOPY WITH PROPOFOL (N/A)  Patient Location: PACU and Endoscopy Unit  Anesthesia Type:MAC  Level of Consciousness: awake, alert , oriented and patient cooperative  Airway & Oxygen Therapy: Patient Spontanous Breathing and Patient connected to face mask oxygen  Post-op Assessment: Report given to RN, Post -op Vital signs reviewed and stable and Patient moving all extremities  Post vital signs: Reviewed and stable  Last Vitals:  Vitals:   02/08/17 0924  BP: (!) 123/52  Pulse: 86  Resp: (!) 29  Temp: 37.2 C  SpO2: 93%    Last Pain:  Vitals:   02/08/17 0924  TempSrc: Oral         Complications: No apparent anesthesia complications

## 2017-02-08 NOTE — H&P (View-Only) (Signed)
[facility] Progress Note  Chief Complaint: Fecal incontinence and diarrhea  Subjective  History:  This is a 76 year old man I last saw in October 2017 for a colonoscopy working up constipation. 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 He underwent a transanal excision that extended down into the anal verge based on the operative report from December 2017. Pathology showed that it was a high-grade dysplastic polyp and was completely removed.  Today is the first time I've see Matthew Edwards since his colonoscopy with me. He is accompanied by one of his daughters, who was visibly angry throughout the entire encounter today. She tells me that ever since the surgery, her father has been incontinent of semi-formed to loose stool multiple times a day. He has had to wear diapers which they change many times a day, he is unable to go anywhere because of this, and she is extremely frustrated with the lack of answers. They have seen Dr. Johney Maine since the surgery, and the patient's daughter felt that this issue was not given sufficient attention. She also does not feel that they were made aware of the possibility of this kind of postoperative problem before they planned at the surgery.  Matthew Edwards has seen primary care since then, and I reviewed that note from both April and July. As near as I can tell, Matthew Edwards denies abdominal pain, nausea or vomiting, and his appetite is reportedly good. However, his daughter states that they restrict his food when he has to go somewhere so that he will not have incontinence.  ROS: Unable to obtain remainder systems due to the patient's dementia. As near as I can tell, he does not seem to be having chest pain or dyspnea or abdominal pain.   The patient's Past Medical, Family and Social History were reviewed and are on file  in the EMR.  Objective:  Med list reviewed  Vital signs in last 24 hrs: Vitals:   01/22/17 1421  BP: 110/60  Pulse: 82    Physical Exam  Morbidly obese elderly man, able to get on the exam table with assistance.  HEENT: sclera anicteric, oral mucosa moist without lesions  Neck: supple, no thyromegaly, JVD or lymphadenopathy  Cardiac: RRR with soft systolic murmur, E1D4 heard, mild peripheral edema  Pulm: clear to auscultation bilaterally, normal RR and effort noted  Abdomen: soft, no tenderness, with active bowel sounds. No guarding or palpable hepatosplenomegaly.  Skin; warm and dry, no jaundice or rash Rectal (chaperoned by our MA Toni): Mildly decreased resting and voluntary sphincter tone, he has palpable postsurgical changes from the 3 to 6:00 position  Recent Labs:  No stool studies done No recent labs   @ASSESSMENTPLANBEGIN @ Assessment: Encounter Diagnoses  Name Primary?  . Full incontinence of feces   . Chronic diarrhea Yes  . History of rectal polypectomy    He definitely has incontinence that I believe is related to postsurgical anal sphincter dysfunction. It is not clear if he is truly having loose stool, as it seems unlikely he would've developed some new digestive condition causing diarrhea at this point. I think the problem is made worse by his dementia and poor mobility, so he probably also has altered anorectal sensation and cannot get to the bathroom even if he does feel the need for a BM.  They saw me today  to discuss this frustrating problem and to get scheduled for repeat colonoscopy. I told him that he needs the colonoscopy mostly for polyp surveillance, and that I certainly could biopsy look for microscopic colitis but I doubt I will identify a cause for diarrhea, if in fact that is truly what he is having. He is to be off Coumadin several days prior, and we will discuss this with his cardiologist. I reviewed their last note from March, and the  patient did not need bridging Lovenox prior to his December 2017 surgery.  Due to this patient's multiple medical issues and debility, I am planning to do the procedure in the hospital endoscopy lab. Again, I found it difficult to communicate with his daughter today due to her demeanor. My office staff was preparing the instructions for the colonoscopy scheduling an preparation when the patient's daughter brought him out of the room and said they would leave him because his blood sugar was low. Offers to bring him food or glucose tablets were declined. They left the office with the daughter stating "mail me the instructions".  I will certainly communicate all this to Dr. Johney Maine so he is aware of the issues at hand, but I don't know what he may have to offer for this. Matthew Edwards is a complicated patient who had a very strong indication for polyp excision, and I did my best to reassure them of that.  Stool for C. difficile testing ordered.  Total time 30 minutes, over half spent in counseling and coordination of care.   Nelida Meuse III   CC: [name], MD [facility] surgery)

## 2017-02-08 NOTE — Op Note (Addendum)
[facility] Patient Name: Matthew Edwards Procedure Date: 02/08/2017 MRN: [id number] Attending MD: Estill Cotta. Loletha Carrow , MD Date of Birth: 04/23/1941 CSN: [id number] Age: 76 Admit Type: Inpatient Procedure:                Colonoscopy Indications:              Chronic diarrhea; Fecal incontinence;s/p trans-anal                            excision of adenomatous polyp with HGD at anal                            verge in Dec, 2017 Providers:                Estill Cotta. Loletha Carrow, MD, Angus Seller, William Dalton, Technician Referring MD:              Medicines:                Monitored Anesthesia Care Complications:            No immediate complications. Estimated Blood Loss:     Estimated blood loss was minimal. Procedure:                Pre-Anesthesia Assessment:                           - Prior to the procedure, a History and Physical                            was performed, and patient medications and                            allergies were reviewed. The patient's tolerance of                            previous anesthesia was also reviewed. The risks                            and benefits of the procedure and the sedation                            options and risks were discussed with the patient.                            All questions were answered, and informed consent                            was obtained. Prior Anticoagulants: The patient has                            taken Coumadin (warfarin), last dose was 5 days  prior to procedure. ASA Grade Assessment: III - A                            patient with severe systemic disease. After                            reviewing the risks and benefits, the patient was                            deemed in satisfactory condition to undergo the                            procedure.                           After obtaining informed consent, the colonoscope               was passed under direct vision. Throughout the                            procedure, the patient's blood pressure, pulse, and                            oxygen saturations were monitored continuously. The                            EC-[id number] ([id number]) scope was introduced through                            the anus and advanced to the the cecum, identified                            by appendiceal orifice and ileocecal valve. The                            colonoscopy was performed without difficulty. The                            patient tolerated the procedure well. The quality                            of the bowel preparation was good. The ileocecal                            valve, appendiceal orifice, and rectum were                            photographed. The quality of the bowel preparation                            was evaluated using the BBPS [facility] Bowel                            Preparation Scale) with scores of: Right Colon =  2,                            Transverse Colon = 2 and Left Colon = 2. The total                            BBPS score equals 6 (after lavage). Scope In: 9:48:34 AM Scope Out: 10:21:55 AM Scope Withdrawal Time: 0 hours 26 minutes 11 seconds  Total Procedure Duration: 0 hours 33 minutes 21 seconds  Findings:      The digital rectal exam findings include decreased resting ST and       post-surgical left/posterior scar at anal verge.      Normal mucosa was found in the entire colon. Biopsies for histology were       taken with a cold forceps from the right colon and left colon for       evaluation of microscopic colitis.      A 6 mm polyp was found in the ascending colon. The polyp was sessile.       The polyp was removed with a cold snare. Resection and retrieval were       complete.      A 12 mm polyp was found in the mid transverse colon. The polyp was       sessile. To prevent bleeding post-intervention, one hemostatic clip was        successfully placed (MR conditional). There was no bleeding at the end       of the procedure. The polyp was removed with a piecemeal technique using       a hot snare. Resection and retrieval were complete. To prevent bleeding       post-intervention, two hemostatic clips were successfully placed (MR       conditional). There was no bleeding at the end of the procedure.      Retroflexion in the rectum was not performed due to anatomy and       difficulty retaining air.      A sample of retained material was sent for C difficile testing. Impression:               - Decreased resting ST and post-surgical                            left/posterior scar at anal verge found on digital                            rectal exam.                           - Normal mucosa in the entire examined colon.                            Biopsied.                           - One 6 mm polyp in the ascending colon, removed                            with a cold snare. Resected and retrieved.                           -  One 12 mm polyp in the mid transverse colon,                            removed piecemeal using a hot snare. Resected and                            retrieved. Clip (MR conditional) was placed. Clips                            (MR conditional) were placed. Moderate Sedation:      MAC sedation used Recommendation:           - Patient has a contact number available for                            emergencies. The signs and symptoms of potential                            delayed complications were discussed with the                            patient. Return to normal activities tomorrow.                            Written discharge instructions were provided to the                            patient.                           - Resume previous diet.                           - Resume Coumadin (warfarin) at prior dose                            tomorrow. Refer to managing physician for further                             adjustment of therapy.                           - Await pathology results.                           - Repeat colonoscopy is recommended for                            surveillance. The colonoscopy date will be                            determined after pathology results from today's                            exam become available for review.                           -  Return to surgeon to discuss available options                            for possible anal sphincter dysfunction. Procedure Code(s):        --- Professional ---                           [phone], Colonoscopy, flexible; with removal of                            tumor(s), polyp(s), or other lesion(s) by snare                            technique                           [id number], 76, Colonoscopy, flexible; with biopsy,                            single or multiple Diagnosis Code(s):        --- Professional ---                           D12.2, Benign neoplasm of ascending colon                           D12.3, Benign neoplasm of transverse colon (hepatic                            flexure or splenic flexure)                           K52.9, Noninfective gastroenteritis and colitis,                            unspecified                           R15.9, Full incontinence of feces CPT copyright 2016 American Medical Association. All rights reserved. The codes documented in this report are preliminary and upon coder review may  be revised to meet current compliance requirements. Henry L. Loletha Carrow, MD 02/08/2017 10:43:19 AM This report has been signed electronically. Number of Addenda: 0

## 2017-02-08 NOTE — Anesthesia Postprocedure Evaluation (Signed)
Anesthesia Post Note  Patient: Matthew Edwards  Procedure(s) Performed: Procedure(s) (LRB): COLONOSCOPY WITH PROPOFOL (N/A)     Patient location during evaluation: PACU Anesthesia Type: MAC Level of consciousness: awake and alert Pain management: pain level controlled Vital Signs Assessment: post-procedure vital signs reviewed and stable Respiratory status: spontaneous breathing, nonlabored ventilation, respiratory function stable and patient connected to nasal cannula oxygen Cardiovascular status: stable and blood pressure returned to baseline Anesthetic complications: no    Last Vitals:  Vitals:   02/08/17 1100 02/08/17 1110  BP: 139/67 127/71  Pulse: 71 85  Resp: 20 18  Temp:    SpO2: 100% 98%    Last Pain:  Vitals:   02/08/17 0924  TempSrc: Oral                 Katie Faraone P Anniebelle Devore

## 2017-02-08 NOTE — Interval H&P Note (Signed)
History and Physical Interval Note:  02/08/2017 9:18 AM  Matthew Edwards  has presented today for surgery, with the diagnosis of DIARREA, HISTORY OF COLON POLYPS  The various methods of treatment have been discussed with the patient and family. After consideration of risks, benefits and other options for treatment, the patient has consented to  Procedure(s): COLONOSCOPY WITH PROPOFOL (N/A) as a surgical intervention .  The patient's history has been reviewed, patient examined, no change in status, stable for surgery.  I have reviewed the patient's chart and labs.  Questions were answered to the patient's satisfaction.     Nelida Meuse III

## 2017-02-09 ENCOUNTER — Encounter (HOSPITAL_COMMUNITY): Payer: Self-pay | Admitting: Gastroenterology

## 2017-02-10 ENCOUNTER — Encounter: Payer: Self-pay | Admitting: Family Medicine

## 2017-02-10 ENCOUNTER — Ambulatory Visit (INDEPENDENT_AMBULATORY_CARE_PROVIDER_SITE_OTHER): Payer: Medicare HMO | Admitting: Family Medicine

## 2017-02-10 VITALS — BP 128/87 | HR 88 | Temp 98.3°F | Ht 69.0 in | Wt 250.0 lb

## 2017-02-10 DIAGNOSIS — R269 Unspecified abnormalities of gait and mobility: Secondary | ICD-10-CM

## 2017-02-10 DIAGNOSIS — I482 Chronic atrial fibrillation, unspecified: Secondary | ICD-10-CM

## 2017-02-10 DIAGNOSIS — I2510 Atherosclerotic heart disease of native coronary artery without angina pectoris: Secondary | ICD-10-CM | POA: Diagnosis not present

## 2017-02-10 DIAGNOSIS — E1140 Type 2 diabetes mellitus with diabetic neuropathy, unspecified: Secondary | ICD-10-CM

## 2017-02-10 DIAGNOSIS — I10 Essential (primary) hypertension: Secondary | ICD-10-CM | POA: Diagnosis not present

## 2017-02-10 DIAGNOSIS — J42 Unspecified chronic bronchitis: Secondary | ICD-10-CM | POA: Diagnosis not present

## 2017-02-10 DIAGNOSIS — E0843 Diabetes mellitus due to underlying condition with diabetic autonomic (poly)neuropathy: Secondary | ICD-10-CM | POA: Diagnosis not present

## 2017-02-10 DIAGNOSIS — R29898 Other symptoms and signs involving the musculoskeletal system: Secondary | ICD-10-CM

## 2017-02-10 NOTE — Assessment & Plan Note (Signed)
PT to help with strength and endurance

## 2017-02-10 NOTE — Assessment & Plan Note (Signed)
Continue his glipizide

## 2017-02-10 NOTE — Assessment & Plan Note (Signed)
Restart PT if able

## 2017-02-10 NOTE — Assessment & Plan Note (Signed)
Just restarted his Coumadin--will need INR soon

## 2017-02-10 NOTE — Assessment & Plan Note (Signed)
Increasing SOB, cannot ambulate in stores--continue symbicort

## 2017-02-10 NOTE — Progress Notes (Signed)
   Subjective:    Patient ID: Matthew Edwards is a 76 y.o. male presenting with f/u meds  on 02/10/2017  HPI: Using immodium all the time since surgery. Feels like he is incontinent and he is cramping. Had recent colonoscopy and had good prep and his pathology is normal. He used to have terrible constipation, but now has to always be close to the bathroom due to inability to control his bowels. His daughter accompanies him today and has noticed a wide legged walk and fall risk. Concerned about needing assisted living. Needs naps to function. This happened when his heart was bothering him.  Having trouble with cleaning and meal prep. Needs frequent reminders to do ADL's. Would like therapies again if cannot get into assisted living. Issues with incontinence need to be noted so they are understanding what can and cannot be done. Sometimes he cannot leave the house due to this. Seems more hard of hearing.  Review of Systems  Constitutional: Negative for chills and fever.  Respiratory: Negative for shortness of breath.   Cardiovascular: Negative for leg swelling.  Gastrointestinal: Negative for abdominal pain, nausea and vomiting.      Objective:    BP 128/87   Pulse 88   Temp 98.3 F (36.8 C) (Oral)   Ht 5\' 9"  (1.753 m)   Wt 250 lb (113.4 kg)   SpO2 98%   BMI 36.92 kg/m  Physical Exam  Constitutional: He appears well-developed and well-nourished. No distress.  HENT:  Head: Normocephalic and atraumatic.  Eyes: No scleral icterus.  Neck: Neck supple.  Cardiovascular: Normal rate.   Pulmonary/Chest: Effort normal.  Abdominal: Soft.  Musculoskeletal: He exhibits no edema.  Neurological: He is alert.  Skin: Skin is warm.  Psychiatric: He has a normal mood and affect.  Vitals reviewed.     Assessment & Plan:   Problem List Items Addressed This Visit      Unprioritized   Essential hypertension    BP at goal. Continue lotensin and metoprolol and diltiazem      Coronary  atherosclerosis    Continue ASA and Lipitor. NTG prn      COPD (chronic obstructive pulmonary disease) (HCC)    Increasing SOB, cannot ambulate in stores--continue symbicort      Diabetic neuropathy (HCC) - Primary    Continue his glipizide      Relevant Orders   Ambulatory referral to Podiatry   Chronic atrial fibrillation ([place])    Just restarted his Coumadin--will need INR soon      Relevant Orders   Ambulatory referral to [facility] extremity weakness    Restart PT if able      Relevant Orders   Ambulatory referral to [place]   Abnormality of gait    PT to help with strength and endurance      Relevant Orders   Ambulatory referral to [place]      Total face-to-face time with patient: 25 minutes. Over 50% of encounter was spent on counseling and coordination of care. Return in about 4 weeks (around 03/10/2017).  Donnamae Jude 02/10/2017 10:57 AM

## 2017-02-10 NOTE — Patient Instructions (Signed)
Advance Directive Advance directives are legal documents that let you make choices ahead of time about your health care and medical treatment in case you become unable to communicate for yourself. Advance directives are a way for you to communicate your wishes to family, friends, and health care providers. This can help convey your decisions about end-of-life care if you become unable to communicate. Discussing and writing advance directives should happen over time rather than all at once. Advance directives can be changed depending on your situation and what you want, even after you have signed the advance directives. If you do not have an advance directive, some states assign family decision makers to act on your behalf based on how closely you are related to them. Each state has its own laws regarding advance directives. You may want to check with your health care provider, attorney, or state representative about the laws in your state. There are different types of advance directives, such as:  Medical power of attorney.  Living will.  Do not resuscitate (DNR) or do not attempt resuscitation (DNAR) order. Health care proxy and medical power of attorney A health care proxy, also called a health care agent, is a person who is appointed to make medical decisions for you in cases in which you are unable to make the decisions yourself. Generally, people choose someone they know well and trust to represent their preferences. Make sure to ask this person for an agreement to act as your proxy. A proxy may have to exercise judgment in the event of a medical decision for which your wishes are not known. A medical power of attorney is a legal document that names your health care proxy. Depending on the laws in your state, after the document is written, it may also need to be:  Signed.  Notarized.  Dated.  Copied.  Witnessed.  Incorporated into your medical record. You may also want to appoint  someone to manage your financial affairs in a situation in which you are unable to do so. This is called a durable power of attorney for finances. It is a separate legal document from the durable power of attorney for health care. You may choose the same person or someone different from your health care proxy to act as your agent in financial matters. If you do not appoint a proxy, or if there is a concern that the proxy is not acting in your best interests, a court-appointed guardian may be designated to act on your behalf. Living will A living will is a set of instructions documenting your wishes about medical care when you cannot express them yourself. Health care providers should keep a copy of your living will in your medical record. You may want to give a copy to family members or friends. To alert caregivers in case of an emergency, you can place a card in your wallet to let them know that you have a living will and where they can find it. A living will is used if you become:  Terminally ill.  Incapacitated.  Unable to communicate or make decisions. Items to consider in your living will include:  The use or non-use of life-sustaining equipment, such as dialysis machines and breathing machines (ventilators).  A DNR or DNAR order, which is the instruction not to use cardiopulmonary resuscitation (CPR) if breathing or heartbeat stops.  The use or non-use of tube feeding.  Withholding of food and fluids.  Comfort (palliative) care when the goal becomes comfort rather than   a cure.  Organ and tissue donation. A living will does not give instructions for distributing your money and property if you should pass away. It is recommended that you seek the advice of a lawyer when writing a will. Decisions about taxes, beneficiaries, and asset distribution will be legally binding. This process can relieve your family and friends of any concerns surrounding disputes or questions that may come up about  the distribution of your assets. DNR or DNAR A DNR or DNAR order is a request not to have CPR in the event that your heart stops beating or you stop breathing. If a DNR or DNAR order has not been made and shared, a health care provider will try to help any patient whose heart has stopped or who has stopped breathing. If you plan to have surgery, talk with your health care provider about how your DNR or DNAR order will be followed if problems occur. Summary  Advance directives are the legal documents that allow you to make choices ahead of time about your health care and medical treatment in case you become unable to communicate for yourself.  The process of discussing and writing advance directives should happen over time. You can change the advance directives, even after you have signed them.  Advance directives include DNR or DNAR orders, living wills, and designating an agent as your medical power of attorney. This information is not intended to replace advice given to you by your health care provider. Make sure you discuss any questions you have with your health care provider. Document Released: 08/25/2007 Document Revised: 04/06/2016 Document Reviewed: 04/06/2016 Elsevier Interactive Patient Education  2017 Elsevier Inc.  

## 2017-02-10 NOTE — Assessment & Plan Note (Signed)
Continue ASA and Lipitor. NTG prn

## 2017-02-10 NOTE — Assessment & Plan Note (Signed)
BP at goal. Continue lotensin and metoprolol and diltiazem

## 2017-02-15 DIAGNOSIS — Z86010 Personal history of colonic polyps: Secondary | ICD-10-CM | POA: Diagnosis not present

## 2017-02-15 DIAGNOSIS — R159 Full incontinence of feces: Secondary | ICD-10-CM | POA: Diagnosis not present

## 2017-02-15 DIAGNOSIS — D128 Benign neoplasm of rectum: Secondary | ICD-10-CM | POA: Diagnosis not present

## 2017-02-24 DIAGNOSIS — Z48815 Encounter for surgical aftercare following surgery on the digestive system: Secondary | ICD-10-CM | POA: Diagnosis not present

## 2017-02-24 DIAGNOSIS — E1143 Type 2 diabetes mellitus with diabetic autonomic (poly)neuropathy: Secondary | ICD-10-CM | POA: Diagnosis not present

## 2017-02-24 DIAGNOSIS — I110 Hypertensive heart disease with heart failure: Secondary | ICD-10-CM | POA: Diagnosis not present

## 2017-02-24 DIAGNOSIS — G3184 Mild cognitive impairment, so stated: Secondary | ICD-10-CM | POA: Diagnosis not present

## 2017-02-24 DIAGNOSIS — R159 Full incontinence of feces: Secondary | ICD-10-CM | POA: Diagnosis not present

## 2017-02-24 DIAGNOSIS — I482 Chronic atrial fibrillation: Secondary | ICD-10-CM | POA: Diagnosis not present

## 2017-02-24 DIAGNOSIS — J449 Chronic obstructive pulmonary disease, unspecified: Secondary | ICD-10-CM | POA: Diagnosis not present

## 2017-02-24 DIAGNOSIS — M48061 Spinal stenosis, lumbar region without neurogenic claudication: Secondary | ICD-10-CM | POA: Diagnosis not present

## 2017-02-24 DIAGNOSIS — I2510 Atherosclerotic heart disease of native coronary artery without angina pectoris: Secondary | ICD-10-CM | POA: Diagnosis not present

## 2017-02-25 ENCOUNTER — Ambulatory Visit: Payer: Medicare HMO | Attending: Physical Therapy | Admitting: Physical Therapy

## 2017-02-25 ENCOUNTER — Telehealth: Payer: Self-pay

## 2017-02-25 NOTE — Telephone Encounter (Signed)
Matthew Edwards, home health nurse, called requesting for pt to be admitted into a skilled nursing facility, for observation and medication management. HHN requesting, 1 week 1, 2 week 2, 1 week 4. HHN requesting orders for PT/OT evaluation, also. Please advise.

## 2017-02-26 ENCOUNTER — Telehealth: Payer: Self-pay

## 2017-02-26 ENCOUNTER — Ambulatory Visit: Payer: Medicare HMO | Admitting: Podiatry

## 2017-02-26 NOTE — Progress Notes (Signed)
Social work consult to assist with potential skilled nursing placement.  LCSW called Magda Paganini [phone] with [facility] to obtain additional information ref. patient being admitted to a skilled nursing facility.  Per Magda Paganini, this is was not her request.  Magda Paganini is requesting start of care orders to admit patient to [facility]. 1 week 1, 2 week 2, 1 week 4.   Medication education and management, PT and OT evaluation, treatment for mobility and fall prevention as well as ADL's and IDL's. Magda Paganini requested LCSW provide PCP the following vitals:  BP is 116/64 in left arm Pulse is 78 radial and 72 apical Respiration rate is 18 Temperature is 98.6  Magda Paganini is requesting verbal start of care orders. Information sent to PCP via in-basket  Casimer Lanius, LCSW Licensed Clinical Social Worker [place]   [phone] 3:27 PM

## 2017-02-26 NOTE — Telephone Encounter (Signed)
Verbal orders given to [facility].

## 2017-02-26 NOTE — Telephone Encounter (Signed)
-----   Message from Maurine Cane, LCSW sent at 02/26/2017  3:32 PM EDT ----- Magda Paganini with [facility] is requesting verbal start of care orders for [facility].  States they can start services as soon as they receive the verbal order.  Thanks Casimer Lanius, LCSW Licensed Clinical Social Worker [place]   [phone] 3:32 PM

## 2017-03-02 ENCOUNTER — Other Ambulatory Visit: Payer: Self-pay | Admitting: *Deleted

## 2017-03-02 ENCOUNTER — Telehealth: Payer: Self-pay

## 2017-03-02 DIAGNOSIS — I2510 Atherosclerotic heart disease of native coronary artery without angina pectoris: Secondary | ICD-10-CM | POA: Diagnosis not present

## 2017-03-02 DIAGNOSIS — M48061 Spinal stenosis, lumbar region without neurogenic claudication: Secondary | ICD-10-CM | POA: Diagnosis not present

## 2017-03-02 DIAGNOSIS — I110 Hypertensive heart disease with heart failure: Secondary | ICD-10-CM | POA: Diagnosis not present

## 2017-03-02 DIAGNOSIS — J449 Chronic obstructive pulmonary disease, unspecified: Secondary | ICD-10-CM | POA: Diagnosis not present

## 2017-03-02 DIAGNOSIS — E1143 Type 2 diabetes mellitus with diabetic autonomic (poly)neuropathy: Secondary | ICD-10-CM | POA: Diagnosis not present

## 2017-03-02 DIAGNOSIS — Z48815 Encounter for surgical aftercare following surgery on the digestive system: Secondary | ICD-10-CM | POA: Diagnosis not present

## 2017-03-02 DIAGNOSIS — G3184 Mild cognitive impairment, so stated: Secondary | ICD-10-CM | POA: Diagnosis not present

## 2017-03-02 DIAGNOSIS — I482 Chronic atrial fibrillation: Secondary | ICD-10-CM | POA: Diagnosis not present

## 2017-03-02 DIAGNOSIS — R159 Full incontinence of feces: Secondary | ICD-10-CM | POA: Diagnosis not present

## 2017-03-02 LAB — POCT INR: INR: 4

## 2017-03-02 NOTE — Telephone Encounter (Signed)
Chrissie Noa, with [facility], called stating pts INR check was 4.0 this morning. Lattie Haw would like to be contacted if anything needs to be done in regards to pts coumadin. Please advise.

## 2017-03-02 NOTE — Telephone Encounter (Signed)
Patient will hold coumadin today and tomorrow. Home health nurse will be back on Thursday to recheck. Busick, Kevin Fenton

## 2017-03-04 ENCOUNTER — Other Ambulatory Visit: Payer: Self-pay | Admitting: *Deleted

## 2017-03-04 ENCOUNTER — Encounter: Payer: Self-pay | Admitting: Cardiology

## 2017-03-04 ENCOUNTER — Ambulatory Visit (INDEPENDENT_AMBULATORY_CARE_PROVIDER_SITE_OTHER): Payer: Medicare HMO | Admitting: Cardiology

## 2017-03-04 VITALS — BP 122/72 | HR 75 | Ht 69.0 in | Wt 253.8 lb

## 2017-03-04 DIAGNOSIS — Z9989 Dependence on other enabling machines and devices: Secondary | ICD-10-CM

## 2017-03-04 DIAGNOSIS — I110 Hypertensive heart disease with heart failure: Secondary | ICD-10-CM | POA: Diagnosis not present

## 2017-03-04 DIAGNOSIS — G3184 Mild cognitive impairment, so stated: Secondary | ICD-10-CM | POA: Diagnosis not present

## 2017-03-04 DIAGNOSIS — R159 Full incontinence of feces: Secondary | ICD-10-CM | POA: Diagnosis not present

## 2017-03-04 DIAGNOSIS — I482 Chronic atrial fibrillation, unspecified: Secondary | ICD-10-CM

## 2017-03-04 DIAGNOSIS — I25810 Atherosclerosis of coronary artery bypass graft(s) without angina pectoris: Secondary | ICD-10-CM

## 2017-03-04 DIAGNOSIS — I10 Essential (primary) hypertension: Secondary | ICD-10-CM

## 2017-03-04 DIAGNOSIS — Z952 Presence of prosthetic heart valve: Secondary | ICD-10-CM | POA: Diagnosis not present

## 2017-03-04 DIAGNOSIS — Z48815 Encounter for surgical aftercare following surgery on the digestive system: Secondary | ICD-10-CM | POA: Diagnosis not present

## 2017-03-04 DIAGNOSIS — G4733 Obstructive sleep apnea (adult) (pediatric): Secondary | ICD-10-CM

## 2017-03-04 DIAGNOSIS — J449 Chronic obstructive pulmonary disease, unspecified: Secondary | ICD-10-CM | POA: Diagnosis not present

## 2017-03-04 DIAGNOSIS — E1143 Type 2 diabetes mellitus with diabetic autonomic (poly)neuropathy: Secondary | ICD-10-CM | POA: Diagnosis not present

## 2017-03-04 DIAGNOSIS — M48061 Spinal stenosis, lumbar region without neurogenic claudication: Secondary | ICD-10-CM | POA: Diagnosis not present

## 2017-03-04 DIAGNOSIS — I2510 Atherosclerotic heart disease of native coronary artery without angina pectoris: Secondary | ICD-10-CM | POA: Diagnosis not present

## 2017-03-04 LAB — POCT INR: INR: 2.7

## 2017-03-04 MED ORDER — NITROGLYCERIN 0.4 MG SL SUBL: 0.4000 mg | SUBLINGUAL_TABLET | SUBLINGUAL | 6 refills | Status: DC | PRN

## 2017-03-04 NOTE — Progress Notes (Signed)
Cardiology Office Note    Date:  03/04/2017   ID:  Matthew Edwards, DOB 04-May-1941, MRN [id number]  PCP:  Donnamae Jude, MD  Cardiologist:  [name] Sleep clinic: Dr. Claiborne Billings   Chief Complaint  Patient presents with  . Follow-up  . Shortness of Breath  . Leg Swelling    History of Present Illness:  Matthew Edwards is a 76 y.o. male with PMH of CAD s/p stent to LAD and LCx, chronic atrial fibrillation on Coumadin, COPD, GERD, hypertension, hyperlipidemia, DM II, severe OSA and s/p AVR. In 2012, he underwent aortic valve replacement with #12m Edwards pericardial valve and had LIMA to LAD CABG. Last cardiac catheterization performed in January 2012 prior to CABG and valvular replacement surgery shows patent LAD stent with 40% stenosis proximal to the stent, patent left circumflex stent. He was initially placed on CPAP, this was later transitioned to BiPAP. This  has been managed by Dr. KClaiborne Billings   The patient did undergo surgical incision of a large polyp in Dec 2017. Since then he has refractory fecal incontinence. Underwent colonoscopy with removal of additional 2 polyps last month. Followed by Dr. DLoletha Carrow   On follow up today he is seen with his daughter. States he is doing fair. Has some LE edema. Chronic DOE. Being evaluated by PT currently. Still not able to use CPAP. Daughter reports he has tried different masks but continues to have throat irritation with use. Notes his gait is more unsteady with wide gait.   He denies any chest pain.   Past Medical History:  Diagnosis Date  . Arthritis   . Atrial fibrillation ([place]   . CAD (coronary artery disease) 2012   [place]-LAD 2 or 3 srents  . Complication of anesthesia    "loopy after polpy removal dec 2017, lasted several days"  . COPD (chronic obstructive pulmonary disease) ([place]   . Dementia   . GERD (gastroesophageal reflux disease)   . HEART FAILURE, CONGESTIVE UNSPEC 02/23/2007   Qualifier: Diagnosis of  By: MMellody DrownMD, [facility]    . Hyperlipidemia   . Hypertension   . OSA (obstructive sleep apnea) 11/13/2014  . S/P AVR    #25 mm Edwards pericardial valve Bioprosthetic. Dr BCyndia Bent  . Situational depression    "son passed 11/2013"  . Sleep apnea    occ uses c pap does not know settings   . Stented coronary artery 2013  . TOBACCO DEPENDENCE 07/29/2006   Qualifier: History of  By: ACarlena Sax MD, SColletta [place]   . Type II diabetes mellitus ([place]     Past Surgical History:  Procedure Laterality Date  . AORTIC VALVE REPLACEMENT  07/2010   notes 07/28/2010  . CARDIAC CATHETERIZATION  06/2010   /Archie Endo1/31/2012  . COLONOSCOPY WITH PROPOFOL N/A 02/08/2017   Procedure: COLONOSCOPY WITH PROPOFOL;  Surgeon: DDoran Stabler MD;  Location: [facility] ENDOSCOPY;  Service: Gastroenterology;  Laterality: N/A;  . CORONARY ANGIOPLASTY WITH STENT PLACEMENT  2006; 10/2006   "2"; 1/notes 10/01/2010  . CORONARY ARTERY BYPASS GRAFT  07/2010   "1"/notes 07/28/2010  . PARTIAL PROCTECTOMY BY TEM N/A 05/26/2016   Procedure: TEM PARTIAL PROCTECTOMY OF RECTAL MASS;  Surgeon: SMichael Boston MD;  Location: [facility] ORS;  Service: General;  Laterality: N/A;    Current Medications: Outpatient Medications Prior to Visit  Medication Sig Dispense Refill  . acetaminophen (TYLENOL) 325 MG tablet Take 650 mg by mouth every 6 (six) hours as needed for mild pain  or moderate pain.    Marland Kitchen atorvastatin (LIPITOR) 40 MG tablet TAKE 1 TABLET DAILY AT 6 PM 90 tablet 3  . benazepril (LOTENSIN) 20 MG tablet Take 1 tablet (20 mg total) by mouth daily. 90 tablet 3  . Blood Glucose Monitoring Suppl (ACCU-CHEK AVIVA PLUS) W/DEVICE KIT 1 Units by Does not apply route 3 (three) times daily. 1 kit 0  . budesonide-formoterol (SYMBICORT) 80-4.5 MCG/ACT inhaler Inhale 2 puffs into the lungs 2 (two) times daily. 10 Inhaler 11  . diltiazem (CARTIA XT) 120 MG 24 hr capsule Take 1 capsule (120 mg total) by mouth daily. 90 capsule 3  . furosemide (LASIX) 40 MG tablet Take 1 tablet (40 mg total) by  mouth 2 (two) times daily. 180 tablet 3  . glipiZIDE (GLUCOTROL) 10 MG tablet TAKE 1 TABLET BY MOUTH AT 5:00 PM WITH A MEAL. 90 tablet 3  . glucose blood (ACCU-CHEK AVIVA PLUS) test strip USE TO CHECK BLOOD SUGARS FOUR TIMES DAILY 400 each 3  . loperamide (IMODIUM) 2 MG capsule Take 4 mg by mouth daily.    . metoprolol succinate (TOPROL-XL) 25 MG 24 hr tablet Take 1 tablet (25 mg total) by mouth daily. 90 tablet 3  . Multiple Vitamins-Minerals (MULTIVITAMIN WITH MINERALS) tablet Take 1 tablet by mouth every evening.    Marland Kitchen oxycodone (OXY-IR) 5 MG capsule Take 5 mg by mouth every 4 (four) hours as needed for pain.     . polyethylene glycol (MIRALAX / GLYCOLAX) packet Take 17 g by mouth every 3 (three) days. 14 each 6  . potassium chloride SA (KLOR-CON M20) 20 MEQ tablet Take 1 tablet (20 mEq total) by mouth daily. 90 tablet 3  . PRESCRIPTION MEDICATION Pt has CPAP machine    . vitamin C (ASCORBIC ACID) 500 MG tablet Take 1 tablet (500 mg total) by mouth every morning. 90 tablet 3  . Vitamin D, Ergocalciferol, (DRISDOL) 50000 units CAPS capsule Take 1 capsule (50,000 Units total) by mouth every 7 (seven) days. Wednesdays 30 capsule 5  . warfarin (COUMADIN) 5 MG tablet Take 7.5-10 mg by mouth daily.    Marland Kitchen warfarin (COUMADIN) 5 MG tablet Take 1 tablet (5 mg total) by mouth daily. Take as directed 90 tablet 3  . nitroGLYCERIN (NITROSTAT) 0.4 MG SL tablet Place 1 tablet (0.4 mg total) under the tongue every 5 (five) minutes as needed. For chest pain 25 tablet 6   No facility-administered medications prior to visit.      Allergies:   Pineapple concentrate   Social History   Social History  . Marital status: Legally Separated    Spouse name: N/A  . Number of children: 7  . Years of education: 21   Occupational History  . Retired-[employer]  . [employer]    Social History Main Topics  . Smoking status: Former Smoker    Packs/day: 0.50    Years: 46.00    Types: Cigarettes  .  Smokeless tobacco: Never Used  . Alcohol use No  . Drug use: No  . Sexual activity: Not Asked   Other Topics Concern  . None   Social History Narrative   Health Care POA:    Emergency Contact: Daughter, [name]   End of Life Plan:    Who lives with you: self   Any pets: none   Diet: Patient has a varied diet of protein, starch, and vegetables.   Exercise: Pt has not regular exercise routine.   Seatbelts: Pt reports  wearing seatbelt when in vehile.   Wynelle Link Exposure/Protection:    Hobbies: fishing, tv           Family History:  The patient's family history includes Heart disease in his mother.   ROS:   Please see the history of present illness.    ROS All other systems reviewed and are negative.   PHYSICAL EXAM:   VS:  BP 122/72   Pulse 75   Ht 5\' 9"  (1.753 m)   Wt 253 lb 12.8 oz (115.1 kg)   BMI 37.48 kg/m    GENERAL:  Well appearing, obese BM in NAD HEENT:  PERRL, EOMI, sclera are clear. Oropharynx is clear. NECK:  No jugular venous distention, carotid upstroke brisk and symmetric, no bruits, no thyromegaly or adenopathy LUNGS:  Clear to auscultation bilaterally CHEST:  Unremarkable HEART: IRRR,  PMI not displaced or sustained,S1 and S2 within normal limits, no S3, no S4: no clicks, no rubs, no murmurs ABD:  Soft, nontender. BS +, no masses or bruits. No hepatomegaly, no splenomegaly EXT:  2 + pulses throughout, trace edema, no cyanosis no clubbing SKIN:  Warm and dry.  No rashes NEURO:  Alert and oriented x 3. Cranial nerves II through XII intact. PSYCH:  Cognitively intact    Wt Readings from Last 3 Encounters:  03/04/17 253 lb 12.8 oz (115.1 kg)  02/10/17 250 lb (113.4 kg)  02/08/17 259 lb (117.5 kg)      Studies/Labs Reviewed:   EKG:  EKG is  ordered today.  Afib with rate 75. Otherwise normal. I have personally reviewed and interpreted this study.   Recent Labs: 05/27/2016: BUN 14; Sodium 135 05/28/2016: Creatinine, Ser 1.26; Magnesium 2.1; Potassium  4.2 06/03/2016: Hemoglobin 12.8; Platelets 210   Lipid Panel    Component Value Date/Time   CHOL 145 07/25/2015 1558   TRIG 181 (H) 07/25/2015 1558   HDL 39 (L) 07/25/2015 1558   CHOLHDL 3.7 07/25/2015 1558   VLDL 36 (H) 07/25/2015 1558   LDLCALC 70 07/25/2015 1558   LDLDIRECT 81 12/23/2012 1638      ASSESSMENT:    1. Coronary artery disease involving coronary bypass graft of native heart without angina pectoris   2. Chronic atrial fibrillation (HCC)   3. S/P AVR (aortic valve replacement)   4. Essential hypertension   5. OSA on CPAP      PLAN:  In order of problems listed above:  1. Afib chronic. Rate well controlled on metoprolol and Cardizem. On coumadin. INR followed by Dr. 12/25/2012.  2. CAD s/p CABG: Last cardiac catheterization in 2012, stable anatomy at the time with patent stent in left circumflex and LAD. Underwent LIMA to LAD bypass surgery during valvular surgery. He is asymptomatic.  3. Hypertension: Blood pressure is well controlled today  4. Hyperlipidemia: On Lipitor daily. Labs followed by primary care.  5. DM II: On glipizide   6. S/p aortic valve replacement: Last echo March 2016, EF 50-55%, stable anatomy with bioprosthetic aortic valve.   7. Edema- well controlled on current lasix dose. Continue Rx and sodium restriction. Discussed compression hose but this would be a challenge for him to put on.  8. OSA. Unable to tolerate CPAP    Medication Adjustments/Labs and Tests Ordered: Current medicines are reviewed at length with the patient today.  Concerns regarding medicines are outlined above.  Medication changes, Labs and Tests ordered today are listed in the Patient Instructions below. Patient Instructions  Continue your current therapy  I  will see you in 6 months  Keep feet elevated when possible. Avoid salt     Signed, Peter Martinique, MD  03/04/2017 11:52 AM    [facility] [place], [place], [place]   [address] Phone: [phone]; Fax: [phone]

## 2017-03-04 NOTE — Patient Instructions (Signed)
Continue your current therapy  I will see you in 6 months  Keep feet elevated when possible. Avoid salt

## 2017-03-05 DIAGNOSIS — M48061 Spinal stenosis, lumbar region without neurogenic claudication: Secondary | ICD-10-CM | POA: Diagnosis not present

## 2017-03-05 DIAGNOSIS — J449 Chronic obstructive pulmonary disease, unspecified: Secondary | ICD-10-CM | POA: Diagnosis not present

## 2017-03-05 DIAGNOSIS — Z48815 Encounter for surgical aftercare following surgery on the digestive system: Secondary | ICD-10-CM | POA: Diagnosis not present

## 2017-03-05 DIAGNOSIS — I482 Chronic atrial fibrillation: Secondary | ICD-10-CM | POA: Diagnosis not present

## 2017-03-05 DIAGNOSIS — E1143 Type 2 diabetes mellitus with diabetic autonomic (poly)neuropathy: Secondary | ICD-10-CM | POA: Diagnosis not present

## 2017-03-05 DIAGNOSIS — I2510 Atherosclerotic heart disease of native coronary artery without angina pectoris: Secondary | ICD-10-CM | POA: Diagnosis not present

## 2017-03-05 DIAGNOSIS — G3184 Mild cognitive impairment, so stated: Secondary | ICD-10-CM | POA: Diagnosis not present

## 2017-03-05 DIAGNOSIS — I110 Hypertensive heart disease with heart failure: Secondary | ICD-10-CM | POA: Diagnosis not present

## 2017-03-05 DIAGNOSIS — R159 Full incontinence of feces: Secondary | ICD-10-CM | POA: Diagnosis not present

## 2017-03-09 DIAGNOSIS — I2510 Atherosclerotic heart disease of native coronary artery without angina pectoris: Secondary | ICD-10-CM | POA: Diagnosis not present

## 2017-03-09 DIAGNOSIS — R159 Full incontinence of feces: Secondary | ICD-10-CM | POA: Diagnosis not present

## 2017-03-09 DIAGNOSIS — G3184 Mild cognitive impairment, so stated: Secondary | ICD-10-CM | POA: Diagnosis not present

## 2017-03-09 DIAGNOSIS — E1143 Type 2 diabetes mellitus with diabetic autonomic (poly)neuropathy: Secondary | ICD-10-CM | POA: Diagnosis not present

## 2017-03-09 DIAGNOSIS — I482 Chronic atrial fibrillation: Secondary | ICD-10-CM | POA: Diagnosis not present

## 2017-03-09 DIAGNOSIS — J449 Chronic obstructive pulmonary disease, unspecified: Secondary | ICD-10-CM | POA: Diagnosis not present

## 2017-03-09 DIAGNOSIS — Z48815 Encounter for surgical aftercare following surgery on the digestive system: Secondary | ICD-10-CM | POA: Diagnosis not present

## 2017-03-09 DIAGNOSIS — M48061 Spinal stenosis, lumbar region without neurogenic claudication: Secondary | ICD-10-CM | POA: Diagnosis not present

## 2017-03-09 DIAGNOSIS — I110 Hypertensive heart disease with heart failure: Secondary | ICD-10-CM | POA: Diagnosis not present

## 2017-03-12 ENCOUNTER — Emergency Department (HOSPITAL_COMMUNITY): Payer: Medicare HMO

## 2017-03-12 ENCOUNTER — Emergency Department (HOSPITAL_COMMUNITY)
Admission: EM | Admit: 2017-03-12 | Discharge: 2017-03-12 | Disposition: A | Discharge status: Home / Self Care | Payer: Medicare HMO | Attending: Emergency Medicine | Admitting: Emergency Medicine

## 2017-03-12 ENCOUNTER — Encounter (HOSPITAL_COMMUNITY): Payer: Self-pay | Admitting: Emergency Medicine

## 2017-03-12 DIAGNOSIS — Z7984 Long term (current) use of oral hypoglycemic drugs: Secondary | ICD-10-CM | POA: Diagnosis not present

## 2017-03-12 DIAGNOSIS — I2510 Atherosclerotic heart disease of native coronary artery without angina pectoris: Secondary | ICD-10-CM | POA: Diagnosis not present

## 2017-03-12 DIAGNOSIS — E1143 Type 2 diabetes mellitus with diabetic autonomic (poly)neuropathy: Secondary | ICD-10-CM | POA: Diagnosis not present

## 2017-03-12 DIAGNOSIS — I482 Chronic atrial fibrillation: Secondary | ICD-10-CM | POA: Diagnosis not present

## 2017-03-12 DIAGNOSIS — R410 Disorientation, unspecified: Secondary | ICD-10-CM | POA: Insufficient documentation

## 2017-03-12 DIAGNOSIS — S0083XA Contusion of other part of head, initial encounter: Secondary | ICD-10-CM | POA: Diagnosis not present

## 2017-03-12 DIAGNOSIS — M48061 Spinal stenosis, lumbar region without neurogenic claudication: Secondary | ICD-10-CM | POA: Diagnosis not present

## 2017-03-12 DIAGNOSIS — J449 Chronic obstructive pulmonary disease, unspecified: Secondary | ICD-10-CM | POA: Insufficient documentation

## 2017-03-12 DIAGNOSIS — I110 Hypertensive heart disease with heart failure: Secondary | ICD-10-CM | POA: Insufficient documentation

## 2017-03-12 DIAGNOSIS — Z48815 Encounter for surgical aftercare following surgery on the digestive system: Secondary | ICD-10-CM | POA: Diagnosis not present

## 2017-03-12 DIAGNOSIS — G3184 Mild cognitive impairment, so stated: Secondary | ICD-10-CM | POA: Diagnosis not present

## 2017-03-12 DIAGNOSIS — I509 Heart failure, unspecified: Secondary | ICD-10-CM | POA: Diagnosis not present

## 2017-03-12 DIAGNOSIS — Z79899 Other long term (current) drug therapy: Secondary | ICD-10-CM | POA: Insufficient documentation

## 2017-03-12 DIAGNOSIS — R4182 Altered mental status, unspecified: Secondary | ICD-10-CM | POA: Diagnosis not present

## 2017-03-12 DIAGNOSIS — E119 Type 2 diabetes mellitus without complications: Secondary | ICD-10-CM | POA: Insufficient documentation

## 2017-03-12 DIAGNOSIS — R51 Headache: Secondary | ICD-10-CM | POA: Diagnosis not present

## 2017-03-12 DIAGNOSIS — F0390 Unspecified dementia without behavioral disturbance: Secondary | ICD-10-CM | POA: Insufficient documentation

## 2017-03-12 DIAGNOSIS — Z87891 Personal history of nicotine dependence: Secondary | ICD-10-CM | POA: Insufficient documentation

## 2017-03-12 DIAGNOSIS — R159 Full incontinence of feces: Secondary | ICD-10-CM | POA: Diagnosis not present

## 2017-03-12 DIAGNOSIS — R9431 Abnormal electrocardiogram [ECG] [EKG]: Secondary | ICD-10-CM | POA: Diagnosis not present

## 2017-03-12 LAB — CBC WITH DIFFERENTIAL/PLATELET
Basophils Absolute: 0 10*3/uL (ref 0.0–0.1)
Basophils Relative: 0 %
Eosinophils Absolute: 0.1 10*3/uL (ref 0.0–0.7)
Eosinophils Relative: 1 %
HCT: 45.5 % (ref 39.0–52.0)
Hemoglobin: 14.9 g/dL (ref 13.0–17.0)
Lymphocytes Relative: 30 %
Lymphs Abs: 1.7 10*3/uL (ref 0.7–4.0)
MCH: 30.1 pg (ref 26.0–34.0)
MCHC: 32.7 g/dL (ref 30.0–36.0)
MCV: 91.9 fL (ref 78.0–100.0)
Monocytes Absolute: 0.4 10*3/uL (ref 0.1–1.0)
Monocytes Relative: 7 %
Neutro Abs: 3.5 10*3/uL (ref 1.7–7.7)
Neutrophils Relative %: 62 %
Platelets: 140 10*3/uL — ABNORMAL LOW (ref 150–400)
RBC: 4.95 MIL/uL (ref 4.22–5.81)
RDW: 13.8 % (ref 11.5–15.5)
WBC: 5.7 10*3/uL (ref 4.0–10.5)

## 2017-03-12 LAB — COMPREHENSIVE METABOLIC PANEL
ALT: 20 U/L (ref 17–63)
AST: 25 U/L (ref 15–41)
Albumin: 4.3 g/dL (ref 3.5–5.0)
Alkaline Phosphatase: 63 U/L (ref 38–126)
Anion gap: 9 (ref 5–15)
BUN: 17 mg/dL (ref 6–20)
CO2: 27 mmol/L (ref 22–32)
Calcium: 9.8 mg/dL (ref 8.9–10.3)
Chloride: 105 mmol/L (ref 101–111)
Creatinine, Ser: 1.16 mg/dL (ref 0.61–1.24)
GFR calc Af Amer: >60 mL/min (ref >60)
GFR calc non Af Amer: >60 mL/min (ref >60)
Glucose, Bld: 141 mg/dL — ABNORMAL HIGH (ref 65–99)
Potassium: 4.2 mmol/L (ref 3.5–5.1)
Sodium: 141 mmol/L (ref 135–145)
Total Bilirubin: 0.7 mg/dL (ref 0.3–1.2)
Total Protein: 7.3 g/dL (ref 6.5–8.1)

## 2017-03-12 LAB — RAPID URINE DRUG SCREEN, HOSP PERFORMED
Amphetamines: NOT DETECTED
Barbiturates: NOT DETECTED
Benzodiazepines: NOT DETECTED
Cocaine: NOT DETECTED
Opiates: NOT DETECTED
Tetrahydrocannabinol: NOT DETECTED

## 2017-03-12 LAB — URINALYSIS, ROUTINE W REFLEX MICROSCOPIC
Bilirubin Urine: NEGATIVE
Glucose, UA: NEGATIVE mg/dL
Hgb urine dipstick: NEGATIVE
Ketones, ur: 5 mg/dL — AB
Leukocytes, UA: NEGATIVE
Nitrite: NEGATIVE
Protein, ur: NEGATIVE mg/dL
Specific Gravity, Urine: 1.026 (ref 1.005–1.030)
pH: 5 (ref 5.0–8.0)

## 2017-03-12 LAB — CBG MONITORING, ED: Glucose-Capillary: 134 mg/dL — ABNORMAL HIGH (ref 65–99)

## 2017-03-12 LAB — PROTIME-INR
INR: 1.95
Prothrombin Time: 22.1 seconds — ABNORMAL HIGH (ref 11.4–15.2)

## 2017-03-12 LAB — APTT: aPTT: 36 seconds (ref 24–36)

## 2017-03-12 LAB — ETHANOL: Alcohol, Ethyl (B): <10 mg/dL (ref <10)

## 2017-03-12 MED ORDER — SODIUM CHLORIDE 0.9 % IV SOLN
100.0000 mL/h | INTRAVENOUS | Status: DC
  Administered 2017-03-12: 100 mL/h via INTRAVENOUS

## 2017-03-12 MED ORDER — SODIUM CHLORIDE 0.9 % IV BOLUS (SEPSIS)
500.0000 mL | Freq: Once | INTRAVENOUS | Status: AC
  Administered 2017-03-12: 500 mL via INTRAVENOUS

## 2017-03-12 NOTE — ED Triage Notes (Signed)
Patient presents with daughter stating she found him at home with altered mental status. Saying things were moved around the house. Daughter states last known well would be aprox 10pm last night. Pt takes coumadin. C/o headache. MD at bedside.

## 2017-03-12 NOTE — ED Provider Notes (Signed)
[facility] DEPT Provider Note   CSN: [id number] Arrival date & time: 03/12/17  1649     History   Chief Complaint Chief Complaint  Patient presents with  . Altered Mental Status    HPI Matthew Edwards is a 76 y.o. male.  HPI  Patient presents with his daughter due to concern of altered mental status and disorientation. Patient lastwas seen by his daughter yesterday, and was in his usual state of health. She notes that today, about 2 hours ago she went to see the patient, who lives alone, and his house seemed in [place], and he seemed confused, speaking nonsensically, though with intelligible speech. Patient himself is unsure of why he's here, denies pain, denies confusion, denies current discomfort. He denies recent medication changes, diet changes, activity changes. The patient was seen by a home health worker in the interval, but is unclear if there is any witnessed change from his baseline by that individual. Patient has multiple medical issues including atrial fibrillation, and is on Coumadin. Daughter notes that when she found the patient, he seemed to have a small laceration on his forehead, and she is concerned of possible fall, given the disorientation.   Past Medical History:  Diagnosis Date  . Arthritis   . Atrial fibrillation ([place])   . CAD (coronary artery disease) 2012   [place]-LAD 2 or 3 srents  . Complication of anesthesia    "loopy after polpy removal dec 2017, lasted several days"  . COPD (chronic obstructive pulmonary disease) ([place])   . Dementia   . GERD (gastroesophageal reflux disease)   . HEART FAILURE, CONGESTIVE UNSPEC 02/23/2007   Qualifier: Diagnosis of  By: Mellody Drown MD, [facility]    . Hyperlipidemia   . Hypertension   . OSA (obstructive sleep apnea) 11/13/2014  . S/P AVR    #25 mm Edwards pericardial valve Bioprosthetic. Dr Cyndia Bent.  . Situational depression    "son passed 11/2013"  . Sleep apnea    occ uses c pap does not know settings   .  Stented coronary artery 2013  . TOBACCO DEPENDENCE 07/29/2006   Qualifier: History of  By: Carlena Sax  MD, [place]    . Type II diabetes mellitus [facility])     Patient Active Problem List   Diagnosis Date Noted  . Lateral epicondylitis of right elbow 09/27/2016  . Acute pain of both knees 09/08/2016  . Chronic bilateral low back pain without sciatica 09/08/2016  . Adenomatous rectal polyp with high grade dysplasia s/p TEM resection 05/26/2016 05/26/2016  . Spinal stenosis of lumbar region 11/28/2015  . Abnormality of gait   . Lower extremity weakness   . Type 2 diabetes mellitus with complication, without long-term current use of insulin ([place])   . Weakness 09/18/2015  . Constipation 01/28/2015  . OSA (obstructive sleep apnea) 11/13/2014  . Pulmonary HTN ([place]) 08/24/2014  . Chronic anticoagulation   . Chronic atrial fibrillation ([place])   . Acute on chronic congestive heart failure with left ventricular diastolic dysfunction ([place]) 08/02/2014  . Cognitive impairment 07/02/2014  . Altered mental status 06/28/2014  . Abdominal distention 06/28/2014  . Unspecified constipation 12/13/2013  . S/P AVR 12/29/2012  . Diabetic neuropathy ([place]) 11/25/2011  . Excessive tearing 10/06/2011  . BENIGN POSITIONAL VERTIGO 06/14/2010  . Dizziness 06/14/2010  . Vitamin D deficiency 02/21/2010  . Primary hyperparathyroidism ([place]) 06/04/2009  . Hypercalcemia 05/29/2009  . Essential hypertension 02/23/2007  . DEPRESSION 12/15/2006  . Coronary atherosclerosis 12/15/2006  . GERD 12/15/2006  . COPD (  chronic obstructive pulmonary disease) ([place]) 12/10/2006  . HLD (hyperlipidemia) 07/29/2006  . Alcohol abuse 07/29/2006  . Aortic valve disorder 07/29/2006    Past Surgical History:  Procedure Laterality Date  . AORTIC VALVE REPLACEMENT  07/2010   notes 07/28/2010  . CARDIAC CATHETERIZATION  06/2010   Archie Endo 07/01/2010  . COLONOSCOPY WITH PROPOFOL N/A 02/08/2017   Procedure: COLONOSCOPY WITH PROPOFOL;  Surgeon:  Doran Stabler, MD;  Location: [facility] ENDOSCOPY;  Service: Gastroenterology;  Laterality: N/A;  . CORONARY ANGIOPLASTY WITH STENT PLACEMENT  2006; 10/2006   "2"; 1/notes 10/01/2010  . CORONARY ARTERY BYPASS GRAFT  07/2010   "1"/notes 07/28/2010  . PARTIAL PROCTECTOMY BY TEM N/A 05/26/2016   Procedure: TEM PARTIAL PROCTECTOMY OF RECTAL MASS;  Surgeon: [name], MD;  Location: [facility] ORS;  Service: General;  Laterality: N/A;       Home Medications    Prior to Admission medications   Medication Sig Start Date End Date Taking? Authorizing Provider  acetaminophen (TYLENOL) 325 MG tablet Take 650 mg by mouth every 6 (six) hours as needed for mild pain or moderate pain.   Yes [provider]  atorvastatin (LIPITOR) 40 MG tablet TAKE 1 TABLET DAILY AT 6 PM 02/05/17  Yes Donnamae Jude, MD  benazepril (LOTENSIN) 20 MG tablet Take 1 tablet (20 mg total) by mouth daily. 02/05/17  Yes Donnamae Jude, MD  budesonide-formoterol [facility]) 80-4.5 MCG/ACT inhaler Inhale 2 puffs into the lungs 2 (two) times daily. 02/05/17  Yes Donnamae Jude, MD  diltiazem (CARTIA XT) 120 MG 24 hr capsule Take 1 capsule (120 mg total) by mouth daily. 02/05/17  Yes Donnamae Jude, MD  furosemide (LASIX) 40 MG tablet Take 1 tablet (40 mg total) by mouth 2 (two) times daily. 02/05/17  Yes Donnamae Jude, MD  glipiZIDE (GLUCOTROL) 10 MG tablet TAKE 1 TABLET BY MOUTH AT 5:00 PM WITH A MEAL. 02/05/17  Yes Donnamae Jude, MD  loperamide (IMODIUM) 2 MG capsule Take 4 mg by mouth daily.   Yes [provider]  metoprolol succinate (TOPROL-XL) 25 MG 24 hr tablet Take 1 tablet (25 mg total) by mouth daily. 02/05/17  Yes Donnamae Jude, MD  Multiple Vitamins-Minerals (MULTIVITAMIN WITH MINERALS) tablet Take 1 tablet by mouth every evening.   Yes [provider]  nitroGLYCERIN (NITROSTAT) 0.4 MG SL tablet Place 1 tablet (0.4 mg total) under the tongue every 5 (five) minutes as needed. For chest pain 03/04/17  Yes [place], Peter  M, MD  oxycodone (OXY-IR) 5 MG capsule Take 5 mg by mouth every 4 (four) hours as needed for pain.    Yes [provider]  potassium chloride SA (KLOR-CON M20) 20 MEQ tablet Take 1 tablet (20 mEq total) by mouth daily. 02/05/17  Yes Donnamae Jude, MD  psyllium (METAMUCIL) 58.6 % powder Take 1 packet by mouth daily.   Yes [provider]  vitamin C (ASCORBIC ACID) 500 MG tablet Take 1 tablet (500 mg total) by mouth every morning. 02/05/17  Yes Donnamae Jude, MD  Vitamin D, Ergocalciferol, (DRISDOL) 50000 units CAPS capsule Take 1 capsule (50,000 Units total) by mouth every 7 (seven) days. Wednesdays 02/05/17  Yes Donnamae Jude, MD  warfarin (COUMADIN) 5 MG tablet Take 1 tablet (5 mg total) by mouth daily. Take as directed Patient taking differently: Take 7.5-10 mg by mouth daily. Take as directed 02/05/17  Yes Donnamae Jude, MD  Blood Glucose Monitoring Suppl (ACCU-CHEK AVIVA  PLUS) W/DEVICE KIT 1 Units by Does not apply route 3 (three) times daily. 12/10/14   Donnamae Jude, MD  glucose blood (ACCU-CHEK AVIVA PLUS) test strip USE TO CHECK BLOOD SUGARS FOUR TIMES DAILY 02/05/17   Donnamae Jude, MD  polyethylene glycol (MIRALAX / GLYCOLAX) packet Take 17 g by mouth every 3 (three) days. Patient not taking: Reported on 03/12/2017 02/05/17   Donnamae Jude, MD  PRESCRIPTION MEDICATION Pt has CPAP machine    [provider]    Family History Family History  Problem Relation Age of Onset  . Heart disease Mother   . Colon cancer Neg Hx   . Esophageal cancer Neg Hx   . Stomach cancer Neg Hx   . Pancreatic cancer Neg Hx   . Liver disease Neg Hx   . Colon polyps Neg Hx     Social History Social History  Substance Use Topics  . Smoking status: Former Smoker    Packs/day: 0.50    Years: 46.00    Types: Cigarettes  . Smokeless tobacco: Never Used  . Alcohol use No     Allergies   Pineapple concentrate   Review of Systems Review of Systems  Constitutional:       Per  HPI, otherwise negative  HENT:       Per HPI, otherwise negative  Respiratory:       Per HPI, otherwise negative  Cardiovascular:       Per HPI, otherwise negative  Gastrointestinal: Negative for vomiting.  Endocrine:       Negative aside from HPI  Genitourinary:       Neg aside from HPI   Musculoskeletal:       Per HPI, otherwise negative  Skin: Positive for wound.  Neurological: Negative for syncope.  Psychiatric/Behavioral: Positive for confusion.     Physical Exam Updated Vital Signs BP (!) 142/97 (BP Location: Left Arm)   Pulse 89   Temp 98.1 F (36.7 C)   Resp (!) 24   SpO2 94%   Physical Exam  HENT:  Head:    Neck:    Neurological: He displays no atrophy and no tremor. No cranial nerve deficit. He exhibits normal muscle tone. He displays no seizure activity.  Speech is clear, but rambling. Patient is perseverative, oriented to self, roughly to place.     ED Treatments / Results  Labs (all labs ordered are listed, but only abnormal results are displayed) Labs Reviewed  COMPREHENSIVE METABOLIC PANEL - Abnormal; Notable for the following:       Result Value   Glucose, Bld 141 (*)    All other components within normal limits  CBC WITH DIFFERENTIAL/PLATELET - Abnormal; Notable for the following:    Platelets 140 (*)    All other components within normal limits  PROTIME-INR - Abnormal; Notable for the following:    Prothrombin Time 22.1 (*)    All other components within normal limits  CBG MONITORING, ED - Abnormal; Notable for the following:    Glucose-Capillary 134 (*)    All other components within normal limits  APTT  ETHANOL  RAPID URINE DRUG SCREEN, HOSP PERFORMED  URINALYSIS, ROUTINE W REFLEX MICROSCOPIC    EKG  EKG Interpretation  Date/Time:    Ventricular Rate:  105 PR Interval:    QRS Duration: 81 QT Interval:  356 QTC Calculation: 471 R Axis:   56 Text Interpretation:  Atrial fibrillation Abnormal ekg Confirmed by Carmin Muskrat [phone]) on 03/12/2017 5:51:46  PM       Radiology Ct Head Wo Contrast  Result Date: 03/12/2017 CLINICAL DATA:  Altered mental status.  Headache. EXAM: CT HEAD WITHOUT CONTRAST CT CERVICAL SPINE WITHOUT CONTRAST TECHNIQUE: Multidetector CT imaging of the head and cervical spine was performed following the standard protocol without intravenous contrast. Multiplanar CT image reconstructions of the cervical spine were also generated. COMPARISON:  None. FINDINGS: CT HEAD FINDINGS Brain: No evidence of acute infarction, hemorrhage, hydrocephalus, extra-axial collection or mass lesion/mass effect. Moderate brain parenchymal volume loss and periventricular microangiopathy. Vascular: Calcific atherosclerotic disease at the skullbase. Skull: Normal. Negative for fracture or focal lesion. Sinuses/Orbits: No acute finding. Other: None. CT CERVICAL SPINE FINDINGS Alignment: Straightening of the cervical lordosis. Skull base and vertebrae: No acute fracture. No primary bone lesion or focal pathologic process. Soft tissues and spinal canal: No prevertebral fluid or swelling. No visible canal hematoma. Disc levels: Multilevel osteoarthritic changes, particularly prominent at C4-C5, C5-C6 and C6-C7, with disc space narrowing, remodeling of the vertebral bodies, osteophyte formation. Upper chest: Negative. Other: None. IMPRESSION: No acute intracranial abnormality. Moderate brain parenchymal atrophy and chronic microvascular disease. No evidence of acute traumatic injury to cervical spine. Multilevel osteoarthritic changes, particularly severe in the lower cervical spine. Electronically Signed   By: Fidela Salisbury M.D.   On: 03/12/2017 17:38   Ct Cervical Spine Wo Contrast  Result Date: 03/12/2017 CLINICAL DATA:  Altered mental status.  Headache. EXAM: CT HEAD WITHOUT CONTRAST CT CERVICAL SPINE WITHOUT CONTRAST TECHNIQUE: Multidetector CT imaging of the head and cervical spine was performed following the  standard protocol without intravenous contrast. Multiplanar CT image reconstructions of the cervical spine were also generated. COMPARISON:  None. FINDINGS: CT HEAD FINDINGS Brain: No evidence of acute infarction, hemorrhage, hydrocephalus, extra-axial collection or mass lesion/mass effect. Moderate brain parenchymal volume loss and periventricular microangiopathy. Vascular: Calcific atherosclerotic disease at the skullbase. Skull: Normal. Negative for fracture or focal lesion. Sinuses/Orbits: No acute finding. Other: None. CT CERVICAL SPINE FINDINGS Alignment: Straightening of the cervical lordosis. Skull base and vertebrae: No acute fracture. No primary bone lesion or focal pathologic process. Soft tissues and spinal canal: No prevertebral fluid or swelling. No visible canal hematoma. Disc levels: Multilevel osteoarthritic changes, particularly prominent at C4-C5, C5-C6 and C6-C7, with disc space narrowing, remodeling of the vertebral bodies, osteophyte formation. Upper chest: Negative. Other: None. IMPRESSION: No acute intracranial abnormality. Moderate brain parenchymal atrophy and chronic microvascular disease. No evidence of acute traumatic injury to cervical spine. Multilevel osteoarthritic changes, particularly severe in the lower cervical spine. Electronically Signed   By: Fidela Salisbury M.D.   On: 03/12/2017 17:38   Mr Brain Wo Contrast (neuro Protocol)  Result Date: 03/12/2017 CLINICAL DATA:  76 year old male found at home with altered mental status, confusion. Last known well 2200 hour gastric. EXAM: MRI HEAD WITHOUT CONTRAST TECHNIQUE: Multiplanar, multiecho pulse sequences of the brain and surrounding structures were obtained without intravenous contrast. COMPARISON:  Head and cervical spine CT today. Brain and cervical spine MRI 09/19/2015 FINDINGS: Brain: No restricted diffusion to suggest acute infarction. No midline shift, mass effect, evidence of mass lesion, ventriculomegaly,  extra-axial collection or acute intracranial hemorrhage. Cervicomedullary junction and pituitary are within normal limits. Stable gray and white matter signal throughout the brain. Patchy bilateral cerebral white matter T2 and FLAIR hyperintensity, mild to moderate for age. Similar patchy T2 hyperintensity in the pons. Chronic T2 heterogeneity in the deep gray matter nuclei appears mostly perivascular space related. No chronic cerebral blood products or  cortical CED encephalomalacia. Negative cerebellum. Vascular: Major intracranial vascular flow voids are stable. Skull and upper cervical spine: Stable visualized cervical spine. Normal bone marrow signal. Sinuses/Orbits: Stable and negative. Other: Grossly normal visible internal auditory structures. Mastoids remain clear. Scalp and face soft tissues appear negative. IMPRESSION: 1.  No acute intracranial abnormality. 2. Stable noncontrast MRI appearance of the brain since 2017. Moderate for age signal changes most commonly due to chronic small vessel disease. Electronically Signed   By: Genevie Ann M.D.   On: 03/12/2017 20:39    Procedures Procedures (including critical care time)  Medications Ordered in ED Medications  sodium chloride 0.9 % bolus 500 mL (500 mLs Intravenous New Bag/Given 03/12/17 1719)    Followed by  0.9 %  sodium chloride infusion (not administered)     Initial Impression / Assessment and Plan / ED Course  I have reviewed the triage vital signs and the nursing notes.  Pertinent labs & imaging results that were available during my care of the patient were reviewed by me and considered in my medical decision making (see chart for details).  9:19 PM Patient awake and alert.  I discussed all findings the patient's daughter, and he knows most of the results himself as well. With reassuring MRI, CT scan, no evidence for stroke, no evidence for infection, no substantial electrolyte abnormalities, patient is appropriate for  discharge. Patient is  Already using Coumadin, for stroke prophylaxis for his atrial fibrillation.    Final Clinical Impressions(s) / ED Diagnoses   Final diagnoses:  Confusion     Carmin Muskrat, MD 03/12/17 2120

## 2017-03-12 NOTE — ED Notes (Signed)
Patient transported to CT 

## 2017-03-12 NOTE — ED Notes (Signed)
Patient transported to MRI 

## 2017-03-12 NOTE — Discharge Instructions (Signed)
As discussed, your evaluation today has been largely reassuring.  But, it is important that you monitor your condition carefully, and do not hesitate to return to the ED if you develop new, or concerning changes in your condition. ? ?Otherwise, please follow-up with your physician for appropriate ongoing care. ? ?

## 2017-03-12 NOTE — ED Triage Notes (Signed)
Called CT per Dr Vanita Panda for stat CT head

## 2017-03-16 ENCOUNTER — Other Ambulatory Visit: Payer: Self-pay | Admitting: *Deleted

## 2017-03-16 LAB — POCT INR: INR: 2

## 2017-03-18 ENCOUNTER — Other Ambulatory Visit: Payer: Self-pay | Admitting: *Deleted

## 2017-03-18 DIAGNOSIS — I482 Chronic atrial fibrillation: Secondary | ICD-10-CM | POA: Diagnosis not present

## 2017-03-18 DIAGNOSIS — E1143 Type 2 diabetes mellitus with diabetic autonomic (poly)neuropathy: Secondary | ICD-10-CM | POA: Diagnosis not present

## 2017-03-18 DIAGNOSIS — I110 Hypertensive heart disease with heart failure: Secondary | ICD-10-CM | POA: Diagnosis not present

## 2017-03-18 DIAGNOSIS — J449 Chronic obstructive pulmonary disease, unspecified: Secondary | ICD-10-CM | POA: Diagnosis not present

## 2017-03-18 DIAGNOSIS — Z48815 Encounter for surgical aftercare following surgery on the digestive system: Secondary | ICD-10-CM | POA: Diagnosis not present

## 2017-03-18 DIAGNOSIS — I2510 Atherosclerotic heart disease of native coronary artery without angina pectoris: Secondary | ICD-10-CM | POA: Diagnosis not present

## 2017-03-18 DIAGNOSIS — R159 Full incontinence of feces: Secondary | ICD-10-CM | POA: Diagnosis not present

## 2017-03-18 DIAGNOSIS — M48061 Spinal stenosis, lumbar region without neurogenic claudication: Secondary | ICD-10-CM | POA: Diagnosis not present

## 2017-03-18 DIAGNOSIS — G3184 Mild cognitive impairment, so stated: Secondary | ICD-10-CM | POA: Diagnosis not present

## 2017-03-18 LAB — POCT INR: INR: 3.6

## 2017-03-23 DIAGNOSIS — Z48815 Encounter for surgical aftercare following surgery on the digestive system: Secondary | ICD-10-CM | POA: Diagnosis not present

## 2017-03-23 DIAGNOSIS — I482 Chronic atrial fibrillation: Secondary | ICD-10-CM | POA: Diagnosis not present

## 2017-03-23 DIAGNOSIS — I2510 Atherosclerotic heart disease of native coronary artery without angina pectoris: Secondary | ICD-10-CM | POA: Diagnosis not present

## 2017-03-23 DIAGNOSIS — J449 Chronic obstructive pulmonary disease, unspecified: Secondary | ICD-10-CM | POA: Diagnosis not present

## 2017-03-23 DIAGNOSIS — M48061 Spinal stenosis, lumbar region without neurogenic claudication: Secondary | ICD-10-CM | POA: Diagnosis not present

## 2017-03-23 DIAGNOSIS — R159 Full incontinence of feces: Secondary | ICD-10-CM | POA: Diagnosis not present

## 2017-03-23 DIAGNOSIS — E1143 Type 2 diabetes mellitus with diabetic autonomic (poly)neuropathy: Secondary | ICD-10-CM | POA: Diagnosis not present

## 2017-03-23 DIAGNOSIS — G3184 Mild cognitive impairment, so stated: Secondary | ICD-10-CM | POA: Diagnosis not present

## 2017-03-23 DIAGNOSIS — I110 Hypertensive heart disease with heart failure: Secondary | ICD-10-CM | POA: Diagnosis not present

## 2017-03-25 DIAGNOSIS — I2510 Atherosclerotic heart disease of native coronary artery without angina pectoris: Secondary | ICD-10-CM | POA: Diagnosis not present

## 2017-03-25 DIAGNOSIS — M48061 Spinal stenosis, lumbar region without neurogenic claudication: Secondary | ICD-10-CM | POA: Diagnosis not present

## 2017-03-25 DIAGNOSIS — R159 Full incontinence of feces: Secondary | ICD-10-CM | POA: Diagnosis not present

## 2017-03-25 DIAGNOSIS — I482 Chronic atrial fibrillation: Secondary | ICD-10-CM | POA: Diagnosis not present

## 2017-03-25 DIAGNOSIS — Z48815 Encounter for surgical aftercare following surgery on the digestive system: Secondary | ICD-10-CM | POA: Diagnosis not present

## 2017-03-25 DIAGNOSIS — E1143 Type 2 diabetes mellitus with diabetic autonomic (poly)neuropathy: Secondary | ICD-10-CM | POA: Diagnosis not present

## 2017-03-25 DIAGNOSIS — I110 Hypertensive heart disease with heart failure: Secondary | ICD-10-CM | POA: Diagnosis not present

## 2017-03-25 DIAGNOSIS — J449 Chronic obstructive pulmonary disease, unspecified: Secondary | ICD-10-CM | POA: Diagnosis not present

## 2017-03-25 DIAGNOSIS — G3184 Mild cognitive impairment, so stated: Secondary | ICD-10-CM | POA: Diagnosis not present

## 2017-03-30 DIAGNOSIS — I2510 Atherosclerotic heart disease of native coronary artery without angina pectoris: Secondary | ICD-10-CM | POA: Diagnosis not present

## 2017-03-30 DIAGNOSIS — E1143 Type 2 diabetes mellitus with diabetic autonomic (poly)neuropathy: Secondary | ICD-10-CM | POA: Diagnosis not present

## 2017-03-30 DIAGNOSIS — I110 Hypertensive heart disease with heart failure: Secondary | ICD-10-CM | POA: Diagnosis not present

## 2017-03-30 DIAGNOSIS — I482 Chronic atrial fibrillation: Secondary | ICD-10-CM | POA: Diagnosis not present

## 2017-03-30 DIAGNOSIS — R159 Full incontinence of feces: Secondary | ICD-10-CM | POA: Diagnosis not present

## 2017-03-30 DIAGNOSIS — J449 Chronic obstructive pulmonary disease, unspecified: Secondary | ICD-10-CM | POA: Diagnosis not present

## 2017-03-30 DIAGNOSIS — Z48815 Encounter for surgical aftercare following surgery on the digestive system: Secondary | ICD-10-CM | POA: Diagnosis not present

## 2017-03-30 DIAGNOSIS — M48061 Spinal stenosis, lumbar region without neurogenic claudication: Secondary | ICD-10-CM | POA: Diagnosis not present

## 2017-03-30 DIAGNOSIS — G3184 Mild cognitive impairment, so stated: Secondary | ICD-10-CM | POA: Diagnosis not present

## 2017-04-01 ENCOUNTER — Other Ambulatory Visit: Payer: Self-pay | Admitting: *Deleted

## 2017-04-01 DIAGNOSIS — E1143 Type 2 diabetes mellitus with diabetic autonomic (poly)neuropathy: Secondary | ICD-10-CM | POA: Diagnosis not present

## 2017-04-01 DIAGNOSIS — I110 Hypertensive heart disease with heart failure: Secondary | ICD-10-CM | POA: Diagnosis not present

## 2017-04-01 DIAGNOSIS — M48061 Spinal stenosis, lumbar region without neurogenic claudication: Secondary | ICD-10-CM | POA: Diagnosis not present

## 2017-04-01 DIAGNOSIS — J449 Chronic obstructive pulmonary disease, unspecified: Secondary | ICD-10-CM | POA: Diagnosis not present

## 2017-04-01 DIAGNOSIS — R159 Full incontinence of feces: Secondary | ICD-10-CM | POA: Diagnosis not present

## 2017-04-01 DIAGNOSIS — Z48815 Encounter for surgical aftercare following surgery on the digestive system: Secondary | ICD-10-CM | POA: Diagnosis not present

## 2017-04-01 DIAGNOSIS — I2510 Atherosclerotic heart disease of native coronary artery without angina pectoris: Secondary | ICD-10-CM | POA: Diagnosis not present

## 2017-04-01 DIAGNOSIS — G3184 Mild cognitive impairment, so stated: Secondary | ICD-10-CM | POA: Diagnosis not present

## 2017-04-01 DIAGNOSIS — I482 Chronic atrial fibrillation: Secondary | ICD-10-CM | POA: Diagnosis not present

## 2017-04-01 LAB — POCT INR: INR: 2.4

## 2017-04-07 DIAGNOSIS — I110 Hypertensive heart disease with heart failure: Secondary | ICD-10-CM | POA: Diagnosis not present

## 2017-04-07 DIAGNOSIS — J449 Chronic obstructive pulmonary disease, unspecified: Secondary | ICD-10-CM | POA: Diagnosis not present

## 2017-04-07 DIAGNOSIS — M48061 Spinal stenosis, lumbar region without neurogenic claudication: Secondary | ICD-10-CM | POA: Diagnosis not present

## 2017-04-07 DIAGNOSIS — Z48815 Encounter for surgical aftercare following surgery on the digestive system: Secondary | ICD-10-CM | POA: Diagnosis not present

## 2017-04-07 DIAGNOSIS — I482 Chronic atrial fibrillation: Secondary | ICD-10-CM | POA: Diagnosis not present

## 2017-04-07 DIAGNOSIS — G3184 Mild cognitive impairment, so stated: Secondary | ICD-10-CM | POA: Diagnosis not present

## 2017-04-07 DIAGNOSIS — R159 Full incontinence of feces: Secondary | ICD-10-CM | POA: Diagnosis not present

## 2017-04-07 DIAGNOSIS — E1143 Type 2 diabetes mellitus with diabetic autonomic (poly)neuropathy: Secondary | ICD-10-CM | POA: Diagnosis not present

## 2017-04-07 DIAGNOSIS — I2510 Atherosclerotic heart disease of native coronary artery without angina pectoris: Secondary | ICD-10-CM | POA: Diagnosis not present

## 2017-04-15 ENCOUNTER — Other Ambulatory Visit: Payer: Self-pay | Admitting: *Deleted

## 2017-04-15 DIAGNOSIS — J449 Chronic obstructive pulmonary disease, unspecified: Secondary | ICD-10-CM | POA: Diagnosis not present

## 2017-04-15 DIAGNOSIS — I2510 Atherosclerotic heart disease of native coronary artery without angina pectoris: Secondary | ICD-10-CM | POA: Diagnosis not present

## 2017-04-15 DIAGNOSIS — Z48815 Encounter for surgical aftercare following surgery on the digestive system: Secondary | ICD-10-CM | POA: Diagnosis not present

## 2017-04-15 DIAGNOSIS — I110 Hypertensive heart disease with heart failure: Secondary | ICD-10-CM | POA: Diagnosis not present

## 2017-04-15 DIAGNOSIS — M48061 Spinal stenosis, lumbar region without neurogenic claudication: Secondary | ICD-10-CM | POA: Diagnosis not present

## 2017-04-15 DIAGNOSIS — G3184 Mild cognitive impairment, so stated: Secondary | ICD-10-CM | POA: Diagnosis not present

## 2017-04-15 DIAGNOSIS — I482 Chronic atrial fibrillation: Secondary | ICD-10-CM | POA: Diagnosis not present

## 2017-04-15 DIAGNOSIS — E1143 Type 2 diabetes mellitus with diabetic autonomic (poly)neuropathy: Secondary | ICD-10-CM | POA: Diagnosis not present

## 2017-04-15 DIAGNOSIS — R159 Full incontinence of feces: Secondary | ICD-10-CM | POA: Diagnosis not present

## 2017-04-15 LAB — POCT INR: INR: 3.3

## 2017-04-23 DIAGNOSIS — I2510 Atherosclerotic heart disease of native coronary artery without angina pectoris: Secondary | ICD-10-CM | POA: Diagnosis not present

## 2017-04-23 DIAGNOSIS — R159 Full incontinence of feces: Secondary | ICD-10-CM | POA: Diagnosis not present

## 2017-04-23 DIAGNOSIS — M48061 Spinal stenosis, lumbar region without neurogenic claudication: Secondary | ICD-10-CM | POA: Diagnosis not present

## 2017-04-23 DIAGNOSIS — Z48815 Encounter for surgical aftercare following surgery on the digestive system: Secondary | ICD-10-CM | POA: Diagnosis not present

## 2017-04-23 DIAGNOSIS — Z7901 Long term (current) use of anticoagulants: Secondary | ICD-10-CM | POA: Diagnosis not present

## 2017-04-23 DIAGNOSIS — J449 Chronic obstructive pulmonary disease, unspecified: Secondary | ICD-10-CM | POA: Diagnosis not present

## 2017-04-23 DIAGNOSIS — E1143 Type 2 diabetes mellitus with diabetic autonomic (poly)neuropathy: Secondary | ICD-10-CM | POA: Diagnosis not present

## 2017-04-23 DIAGNOSIS — I110 Hypertensive heart disease with heart failure: Secondary | ICD-10-CM | POA: Diagnosis not present

## 2017-04-23 DIAGNOSIS — G3184 Mild cognitive impairment, so stated: Secondary | ICD-10-CM | POA: Diagnosis not present

## 2017-04-23 DIAGNOSIS — I482 Chronic atrial fibrillation: Secondary | ICD-10-CM | POA: Diagnosis not present

## 2017-04-25 DIAGNOSIS — R159 Full incontinence of feces: Secondary | ICD-10-CM | POA: Diagnosis not present

## 2017-04-25 DIAGNOSIS — I2510 Atherosclerotic heart disease of native coronary artery without angina pectoris: Secondary | ICD-10-CM | POA: Diagnosis not present

## 2017-04-25 DIAGNOSIS — I482 Chronic atrial fibrillation: Secondary | ICD-10-CM | POA: Diagnosis not present

## 2017-04-25 DIAGNOSIS — M48061 Spinal stenosis, lumbar region without neurogenic claudication: Secondary | ICD-10-CM | POA: Diagnosis not present

## 2017-04-25 DIAGNOSIS — G3184 Mild cognitive impairment, so stated: Secondary | ICD-10-CM | POA: Diagnosis not present

## 2017-04-25 DIAGNOSIS — J449 Chronic obstructive pulmonary disease, unspecified: Secondary | ICD-10-CM | POA: Diagnosis not present

## 2017-04-25 DIAGNOSIS — I5032 Chronic diastolic (congestive) heart failure: Secondary | ICD-10-CM | POA: Diagnosis not present

## 2017-04-25 DIAGNOSIS — E1143 Type 2 diabetes mellitus with diabetic autonomic (poly)neuropathy: Secondary | ICD-10-CM | POA: Diagnosis not present

## 2017-04-25 DIAGNOSIS — I110 Hypertensive heart disease with heart failure: Secondary | ICD-10-CM | POA: Diagnosis not present

## 2017-04-26 ENCOUNTER — Other Ambulatory Visit: Payer: Self-pay | Admitting: *Deleted

## 2017-04-26 DIAGNOSIS — M48061 Spinal stenosis, lumbar region without neurogenic claudication: Secondary | ICD-10-CM | POA: Diagnosis not present

## 2017-04-26 DIAGNOSIS — I110 Hypertensive heart disease with heart failure: Secondary | ICD-10-CM | POA: Diagnosis not present

## 2017-04-26 DIAGNOSIS — E1143 Type 2 diabetes mellitus with diabetic autonomic (poly)neuropathy: Secondary | ICD-10-CM | POA: Diagnosis not present

## 2017-04-26 DIAGNOSIS — I482 Chronic atrial fibrillation: Secondary | ICD-10-CM | POA: Diagnosis not present

## 2017-04-26 DIAGNOSIS — I2510 Atherosclerotic heart disease of native coronary artery without angina pectoris: Secondary | ICD-10-CM | POA: Diagnosis not present

## 2017-04-26 DIAGNOSIS — J449 Chronic obstructive pulmonary disease, unspecified: Secondary | ICD-10-CM | POA: Diagnosis not present

## 2017-04-26 DIAGNOSIS — R159 Full incontinence of feces: Secondary | ICD-10-CM | POA: Diagnosis not present

## 2017-04-26 DIAGNOSIS — G3184 Mild cognitive impairment, so stated: Secondary | ICD-10-CM | POA: Diagnosis not present

## 2017-04-26 DIAGNOSIS — I5032 Chronic diastolic (congestive) heart failure: Secondary | ICD-10-CM | POA: Diagnosis not present

## 2017-04-26 LAB — PROTIME-INR: INR: 7.7 — AB (ref 0.9–1.1)

## 2017-04-26 NOTE — Progress Notes (Signed)
Home health nurse with [facility] left a voice message Friday afternoon with a critical result of 6.3. A venous sample was collect for confirmation and sent to [facility] with a result of 7.7. No-one called to follow up with that result. I called and spoke with the supervisor at [facility] and was told that the patient was not instructed to hold coumadin, a physician order was needed for that. The patient showed no signs of bruising or bleeding per home health nurse. I instructed them to recheck his INR today and call with results. I also told her to never leave a critical result on the voice mail. They MUST call the on call physician after hours with critical results. Will complete safety zone portal and update voice message to include instructions for reporting critical results.

## 2017-04-28 ENCOUNTER — Telehealth: Payer: Self-pay | Admitting: *Deleted

## 2017-04-28 NOTE — Telephone Encounter (Signed)
Verbal orders given to Nicolette with [facility] for patient. Jazmin Hartsell,CMA

## 2017-04-28 NOTE — Telephone Encounter (Signed)
-----   Message from Donnamae Jude, MD sent at 04/28/2017  8:16 AM EST ----- Regarding: FW: home health orders cAN YOU CALL THIS IN FOR ME? ----- Message ----- From: [place] Pink, CMA Sent: 04/27/2017  11:34 AM To: Donnamae Jude, MD, Valerie Roys, CMA Subject: home health orders                             [facility] needs orders to continue skilled nursing visits. Twice one week, once the next week, every other week for 4 weeks and 2 prn visits.

## 2017-04-30 DIAGNOSIS — J449 Chronic obstructive pulmonary disease, unspecified: Secondary | ICD-10-CM | POA: Diagnosis not present

## 2017-04-30 DIAGNOSIS — G3184 Mild cognitive impairment, so stated: Secondary | ICD-10-CM | POA: Diagnosis not present

## 2017-04-30 DIAGNOSIS — R159 Full incontinence of feces: Secondary | ICD-10-CM | POA: Diagnosis not present

## 2017-04-30 DIAGNOSIS — I2510 Atherosclerotic heart disease of native coronary artery without angina pectoris: Secondary | ICD-10-CM | POA: Diagnosis not present

## 2017-04-30 DIAGNOSIS — M48061 Spinal stenosis, lumbar region without neurogenic claudication: Secondary | ICD-10-CM | POA: Diagnosis not present

## 2017-04-30 DIAGNOSIS — I110 Hypertensive heart disease with heart failure: Secondary | ICD-10-CM | POA: Diagnosis not present

## 2017-04-30 DIAGNOSIS — I482 Chronic atrial fibrillation: Secondary | ICD-10-CM | POA: Diagnosis not present

## 2017-04-30 DIAGNOSIS — I5032 Chronic diastolic (congestive) heart failure: Secondary | ICD-10-CM | POA: Diagnosis not present

## 2017-04-30 DIAGNOSIS — E1143 Type 2 diabetes mellitus with diabetic autonomic (poly)neuropathy: Secondary | ICD-10-CM | POA: Diagnosis not present

## 2017-05-05 DIAGNOSIS — E1143 Type 2 diabetes mellitus with diabetic autonomic (poly)neuropathy: Secondary | ICD-10-CM | POA: Diagnosis not present

## 2017-05-05 DIAGNOSIS — I482 Chronic atrial fibrillation: Secondary | ICD-10-CM | POA: Diagnosis not present

## 2017-05-05 DIAGNOSIS — I110 Hypertensive heart disease with heart failure: Secondary | ICD-10-CM | POA: Diagnosis not present

## 2017-05-05 DIAGNOSIS — I5032 Chronic diastolic (congestive) heart failure: Secondary | ICD-10-CM | POA: Diagnosis not present

## 2017-05-05 DIAGNOSIS — R159 Full incontinence of feces: Secondary | ICD-10-CM | POA: Diagnosis not present

## 2017-05-05 DIAGNOSIS — G3184 Mild cognitive impairment, so stated: Secondary | ICD-10-CM | POA: Diagnosis not present

## 2017-05-05 DIAGNOSIS — M48061 Spinal stenosis, lumbar region without neurogenic claudication: Secondary | ICD-10-CM | POA: Diagnosis not present

## 2017-05-05 DIAGNOSIS — J449 Chronic obstructive pulmonary disease, unspecified: Secondary | ICD-10-CM | POA: Diagnosis not present

## 2017-05-05 DIAGNOSIS — I2510 Atherosclerotic heart disease of native coronary artery without angina pectoris: Secondary | ICD-10-CM | POA: Diagnosis not present

## 2017-05-06 ENCOUNTER — Other Ambulatory Visit: Payer: Self-pay | Admitting: *Deleted

## 2017-05-06 LAB — POCT INR: INR: 1.7

## 2017-05-13 DIAGNOSIS — E1143 Type 2 diabetes mellitus with diabetic autonomic (poly)neuropathy: Secondary | ICD-10-CM | POA: Diagnosis not present

## 2017-05-13 DIAGNOSIS — G3184 Mild cognitive impairment, so stated: Secondary | ICD-10-CM | POA: Diagnosis not present

## 2017-05-13 DIAGNOSIS — I2510 Atherosclerotic heart disease of native coronary artery without angina pectoris: Secondary | ICD-10-CM | POA: Diagnosis not present

## 2017-05-13 DIAGNOSIS — J449 Chronic obstructive pulmonary disease, unspecified: Secondary | ICD-10-CM | POA: Diagnosis not present

## 2017-05-13 DIAGNOSIS — I110 Hypertensive heart disease with heart failure: Secondary | ICD-10-CM | POA: Diagnosis not present

## 2017-05-13 DIAGNOSIS — I482 Chronic atrial fibrillation: Secondary | ICD-10-CM | POA: Diagnosis not present

## 2017-05-13 DIAGNOSIS — I5032 Chronic diastolic (congestive) heart failure: Secondary | ICD-10-CM | POA: Diagnosis not present

## 2017-05-13 DIAGNOSIS — M48061 Spinal stenosis, lumbar region without neurogenic claudication: Secondary | ICD-10-CM | POA: Diagnosis not present

## 2017-05-13 DIAGNOSIS — R159 Full incontinence of feces: Secondary | ICD-10-CM | POA: Diagnosis not present

## 2017-05-14 ENCOUNTER — Other Ambulatory Visit: Payer: Self-pay | Admitting: *Deleted

## 2017-05-14 LAB — POCT INR: INR: 2.2

## 2017-05-16 ENCOUNTER — Encounter (HOSPITAL_COMMUNITY): Payer: Self-pay | Admitting: *Deleted

## 2017-05-16 ENCOUNTER — Emergency Department (HOSPITAL_COMMUNITY): Payer: Medicare HMO

## 2017-05-16 ENCOUNTER — Other Ambulatory Visit: Payer: Self-pay

## 2017-05-16 ENCOUNTER — Emergency Department (HOSPITAL_COMMUNITY)
Admission: EM | Admit: 2017-05-16 | Discharge: 2017-05-16 | Disposition: A | Discharge status: Home / Self Care | Payer: Medicare HMO | Attending: Emergency Medicine | Admitting: Emergency Medicine

## 2017-05-16 DIAGNOSIS — L02611 Cutaneous abscess of right foot: Secondary | ICD-10-CM

## 2017-05-16 DIAGNOSIS — E1140 Type 2 diabetes mellitus with diabetic neuropathy, unspecified: Secondary | ICD-10-CM | POA: Insufficient documentation

## 2017-05-16 DIAGNOSIS — Z7984 Long term (current) use of oral hypoglycemic drugs: Secondary | ICD-10-CM | POA: Diagnosis not present

## 2017-05-16 DIAGNOSIS — L03115 Cellulitis of right lower limb: Secondary | ICD-10-CM | POA: Diagnosis not present

## 2017-05-16 DIAGNOSIS — Z7901 Long term (current) use of anticoagulants: Secondary | ICD-10-CM | POA: Insufficient documentation

## 2017-05-16 DIAGNOSIS — I2510 Atherosclerotic heart disease of native coronary artery without angina pectoris: Secondary | ICD-10-CM | POA: Insufficient documentation

## 2017-05-16 DIAGNOSIS — Z951 Presence of aortocoronary bypass graft: Secondary | ICD-10-CM | POA: Insufficient documentation

## 2017-05-16 DIAGNOSIS — Z79899 Other long term (current) drug therapy: Secondary | ICD-10-CM | POA: Insufficient documentation

## 2017-05-16 DIAGNOSIS — L03031 Cellulitis of right toe: Secondary | ICD-10-CM

## 2017-05-16 DIAGNOSIS — J449 Chronic obstructive pulmonary disease, unspecified: Secondary | ICD-10-CM | POA: Diagnosis not present

## 2017-05-16 DIAGNOSIS — I5033 Acute on chronic diastolic (congestive) heart failure: Secondary | ICD-10-CM | POA: Insufficient documentation

## 2017-05-16 DIAGNOSIS — Z87891 Personal history of nicotine dependence: Secondary | ICD-10-CM | POA: Insufficient documentation

## 2017-05-16 DIAGNOSIS — I110 Hypertensive heart disease with heart failure: Secondary | ICD-10-CM | POA: Diagnosis not present

## 2017-05-16 DIAGNOSIS — Z952 Presence of prosthetic heart valve: Secondary | ICD-10-CM | POA: Insufficient documentation

## 2017-05-16 DIAGNOSIS — F0390 Unspecified dementia without behavioral disturbance: Secondary | ICD-10-CM | POA: Diagnosis not present

## 2017-05-16 DIAGNOSIS — M79671 Pain in right foot: Secondary | ICD-10-CM | POA: Diagnosis present

## 2017-05-16 DIAGNOSIS — M7989 Other specified soft tissue disorders: Secondary | ICD-10-CM | POA: Diagnosis not present

## 2017-05-16 LAB — COMPREHENSIVE METABOLIC PANEL
ALT: 17 U/L (ref 17–63)
AST: 21 U/L (ref 15–41)
Albumin: 4.1 g/dL (ref 3.5–5.0)
Alkaline Phosphatase: 76 U/L (ref 38–126)
Anion gap: 9 (ref 5–15)
BUN: 19 mg/dL (ref 6–20)
CO2: 28 mmol/L (ref 22–32)
Calcium: 10.3 mg/dL (ref 8.9–10.3)
Chloride: 102 mmol/L (ref 101–111)
Creatinine, Ser: 1.25 mg/dL — ABNORMAL HIGH (ref 0.61–1.24)
GFR calc Af Amer: >60 mL/min (ref >60)
GFR calc non Af Amer: 55 mL/min — ABNORMAL LOW (ref >60)
Glucose, Bld: 162 mg/dL — ABNORMAL HIGH (ref 65–99)
Potassium: 4.4 mmol/L (ref 3.5–5.1)
Sodium: 139 mmol/L (ref 135–145)
Total Bilirubin: 1.2 mg/dL (ref 0.3–1.2)
Total Protein: 7.5 g/dL (ref 6.5–8.1)

## 2017-05-16 LAB — CBC WITH DIFFERENTIAL/PLATELET
Basophils Absolute: 0 10*3/uL (ref 0.0–0.1)
Basophils Relative: 0 %
Eosinophils Absolute: 0.1 10*3/uL (ref 0.0–0.7)
Eosinophils Relative: 2 %
HCT: 47.4 % (ref 39.0–52.0)
Hemoglobin: 15.2 g/dL (ref 13.0–17.0)
Lymphocytes Relative: 26 %
Lymphs Abs: 2 10*3/uL (ref 0.7–4.0)
MCH: 30 pg (ref 26.0–34.0)
MCHC: 32.1 g/dL (ref 30.0–36.0)
MCV: 93.5 fL (ref 78.0–100.0)
Monocytes Absolute: 0.4 10*3/uL (ref 0.1–1.0)
Monocytes Relative: 5 %
Neutro Abs: 5.1 10*3/uL (ref 1.7–7.7)
Neutrophils Relative %: 67 %
Platelets: 140 10*3/uL — ABNORMAL LOW (ref 150–400)
RBC: 5.07 MIL/uL (ref 4.22–5.81)
RDW: 14.1 % (ref 11.5–15.5)
WBC: 7.6 10*3/uL (ref 4.0–10.5)

## 2017-05-16 LAB — PROTIME-INR
INR: 2.18
Prothrombin Time: 24.1 seconds — ABNORMAL HIGH (ref 11.4–15.2)

## 2017-05-16 LAB — I-STAT CG4 LACTIC ACID, ED
Lactic Acid, Venous: 1.88 mmol/L (ref 0.5–1.9)
Lactic Acid, Venous: 1.62 mmol/L (ref 0.5–1.9)

## 2017-05-16 MED ORDER — CLINDAMYCIN PHOSPHATE 600 MG/50ML IV SOLN
600.0000 mg | Freq: Once | INTRAVENOUS | Status: AC
  Administered 2017-05-16: 600 mg via INTRAVENOUS
  Filled 2017-05-16: qty 50

## 2017-05-16 MED ORDER — CLINDAMYCIN HCL 150 MG PO CAPS: 300.0000 mg | ORAL_CAPSULE | Freq: Four times a day (QID) | ORAL | 0 refills | Status: DC

## 2017-05-16 NOTE — ED Notes (Signed)
Dinner tray in room

## 2017-05-16 NOTE — ED Notes (Signed)
Diet tray ordered 

## 2017-05-16 NOTE — ED Triage Notes (Signed)
Pt reports being diabetic, cut his own nails and now has right foot swelling and pain.

## 2017-05-16 NOTE — Discharge Instructions (Signed)
Please take 2 tablets of clindamycin once every 6 hours for the next 5 days.  Please take your first dose before you get a bed tonight since you were given a dose of IV antibiotics in the emergency department.  Please do not use her cane until the pain in your foot improves. Please have home health adjust your walker at home to the correct heigh to help with walking until your pain improves.  Take 650 mg of Tylenol every 6 hours as needed for pain control.  Please elevate the foot above the level of your heart to help with inflammation.  Please go to your appointment tomorrow at 4 PM with Dr. Mingo Amber to have your right foot and leg rechecked.  If you develop any new or worsening symptoms, including worsening warmth or redness to the right foot or lower leg, or fever or chills, please return to the emergency department for reevaluation.

## 2017-05-16 NOTE — ED Provider Notes (Signed)
[facility] EMERGENCY DEPARTMENT Provider Note   CSN: [id number] Arrival date & time: 05/16/17  1325     History   Chief Complaint Chief Complaint  Patient presents with  . Foot Pain    HPI Matthew Edwards is a 76 y.o. male with a history of chronic atrial fibrillation on Coumadin, DM Type II, CHF, and OSA who presents to the emergency department with right foot pain.  The patient reports that he was clipping his toenails when the toenail on the right great toe broke off 3 days ago.  He reports swelling and pain that started in the right great toe that has now spread through the foot and up to the ankle.  He reports increased pain with weightbearing on the right foot.  He denies fever, chills, weakness, numbness, right ankle, right knee pain.  No recent falls or injuries.  No drainage from the right toe, foot, or lower leg.  He ambulates with a cane at baseline. He reports that he has a caregiver at home as well as a walker if needed.   The history is provided by the patient. No language interpreter was used.    Past Medical History:  Diagnosis Date  . Arthritis   . Atrial fibrillation ([place])   . CAD (coronary artery disease) 2012   [place]-LAD 2 or 3 srents  . Complication of anesthesia    "loopy after polpy removal dec 2017, lasted several days"  . COPD (chronic obstructive pulmonary disease) ([place])   . Dementia   . GERD (gastroesophageal reflux disease)   . HEART FAILURE, CONGESTIVE UNSPEC 02/23/2007   Qualifier: Diagnosis of  By: Mellody Drown MD, [facility]    . Hyperlipidemia   . Hypertension   . OSA (obstructive sleep apnea) 11/13/2014  . S/P AVR    #25 mm Edwards pericardial valve Bioprosthetic. Dr Cyndia Bent.  . Situational depression    "son passed 11/2013"  . Sleep apnea    occ uses c pap does not know settings   . Stented coronary artery 2013  . TOBACCO DEPENDENCE 07/29/2006   Qualifier: History of  By: Carlena Sax  MD, [place]    . Type II diabetes mellitus [facility])      Patient Active Problem List   Diagnosis Date Noted  . Lateral epicondylitis of right elbow 09/27/2016  . Acute pain of both knees 09/08/2016  . Chronic bilateral low back pain without sciatica 09/08/2016  . Adenomatous rectal polyp with high grade dysplasia s/p TEM resection 05/26/2016 05/26/2016  . Spinal stenosis of lumbar region 11/28/2015  . Abnormality of gait   . Lower extremity weakness   . Type 2 diabetes mellitus with complication, without long-term current use of insulin ([place])   . Weakness 09/18/2015  . Constipation 01/28/2015  . OSA (obstructive sleep apnea) 11/13/2014  . Pulmonary HTN ([place]) 08/24/2014  . Chronic anticoagulation   . Chronic atrial fibrillation ([place])   . Acute on chronic congestive heart failure with left ventricular diastolic dysfunction ([place]) 08/02/2014  . Cognitive impairment 07/02/2014  . Altered mental status 06/28/2014  . Abdominal distention 06/28/2014  . Unspecified constipation 12/13/2013  . S/P AVR 12/29/2012  . Diabetic neuropathy ([place]) 11/25/2011  . Excessive tearing 10/06/2011  . BENIGN POSITIONAL VERTIGO 06/14/2010  . Dizziness 06/14/2010  . Vitamin D deficiency 02/21/2010  . Primary hyperparathyroidism ([place]) 06/04/2009  . Hypercalcemia 05/29/2009  . Essential hypertension 02/23/2007  . DEPRESSION 12/15/2006  . Coronary atherosclerosis 12/15/2006  . GERD 12/15/2006  . COPD (chronic  obstructive pulmonary disease) ([place]) 12/10/2006  . HLD (hyperlipidemia) 07/29/2006  . Alcohol abuse 07/29/2006  . Aortic valve disorder 07/29/2006    Past Surgical History:  Procedure Laterality Date  . AORTIC VALVE REPLACEMENT  07/2010   notes 07/28/2010  . CARDIAC CATHETERIZATION  06/2010   Archie Endo 07/01/2010  . COLONOSCOPY WITH PROPOFOL N/A 02/08/2017   Procedure: COLONOSCOPY WITH PROPOFOL;  Surgeon: Doran Stabler, MD;  Location: [facility] ENDOS[facility];  Service: Gastroenterology;  Laterality: N/A;  . CORONARY ANGIOPLASTY WITH STENT PLACEMENT  2006;  10/2006   "2"; 1/notes 10/01/2010  . CORONARY ARTERY BYPASS GRAFT  07/2010   "1"/notes 07/28/2010  . PARTIAL PROCTECTOMY BY TEM N/A 05/26/2016   Procedure: TEM PARTIAL PROCTECTOMY OF RECTAL MASS;  Surgeon: [name], MD;  Location: [facility] ORS;  Service: General;  Laterality: N/A;       Home Medications    Prior to Admission medications   Medication Sig Start Date End Date Taking? Authorizing Provider  acetaminophen (TYLENOL) 325 MG tablet Take 650 mg by mouth every 6 (six) hours as needed for mild pain or moderate pain.   Yes [provider]  atorvastatin (LIPITOR) 40 MG tablet TAKE 1 TABLET DAILY AT 6 PM Patient taking differently: Take 40 mg by mouth daily at 6 PM. TAKE 1 TABLET DAILY AT 6 PM 02/05/17  Yes Donnamae Jude, MD  benazepril (LOTENSIN) 20 MG tablet Take 1 tablet (20 mg total) by mouth daily. 02/05/17  Yes Donnamae Jude, MD  budesonide-formoterol [facility]) 80-4.5 MCG/ACT inhaler Inhale 2 puffs into the lungs 2 (two) times daily. 02/05/17  Yes Donnamae Jude, MD  diltiazem (CARTIA XT) 120 MG 24 hr capsule Take 1 capsule (120 mg total) by mouth daily. Patient taking differently: Take 120 mg by mouth every evening.  02/05/17  Yes Donnamae Jude, MD  furosemide (LASIX) 40 MG tablet Take 1 tablet (40 mg total) by mouth 2 (two) times daily. Patient taking differently: Take 40 mg by mouth at bedtime.  02/05/17  Yes Donnamae Jude, MD  glipiZIDE (GLUCOTROL) 10 MG tablet TAKE 1 TABLET BY MOUTH AT 5:00 PM WITH A MEAL. Patient taking differently: Take 10 mg by mouth every evening. TAKE 1 TABLET BY MOUTH AT 5:00 PM WITH A MEAL. 02/05/17  Yes Donnamae Jude, MD  loperamide (IMODIUM) 2 MG capsule Take 4 mg by mouth daily.   Yes [provider]  metoprolol succinate (TOPROL-XL) 25 MG 24 hr tablet Take 1 tablet (25 mg total) by mouth daily. 02/05/17  Yes Donnamae Jude, MD  Multiple Vitamins-Minerals (MULTIVITAMIN WITH MINERALS) tablet Take 1 tablet by mouth every evening.   Yes [provider]  nitroGLYCERIN (NITROSTAT) 0.4 MG SL tablet Place 1 tablet (0.4 mg total) under the tongue every 5 (five) minutes as needed. For chest pain 03/04/17  Yes [place], Peter M, MD  oxycodone (OXY-IR) 5 MG capsule Take 5 mg by mouth every 4 (four) hours as needed for pain.    Yes [provider]  potassium chloride SA (KLOR-CON M20) 20 MEQ tablet Take 1 tablet (20 mEq total) by mouth daily. Patient taking differently: Take 20 mEq by mouth at bedtime.  02/05/17  Yes Donnamae Jude, MD  psyllium (METAMUCIL) 58.6 % powder Take 1 packet by mouth daily.   Yes [provider]  vitamin C (ASCORBIC ACID) 500 MG tablet Take 1 tablet (500 mg total) by mouth every morning. 02/05/17  Yes Donnamae Jude, MD  Vitamin D, Ergocalciferol, (DRISDOL) 50000 units CAPS capsule Take 1 capsule (50,000 Units total) by mouth every 7 (seven) days. Wednesdays 02/05/17  Yes Donnamae Jude, MD  warfarin (COUMADIN) 5 MG tablet Take 1 tablet (5 mg total) by mouth daily. Take as directed Patient taking differently: Take 7.5-10 mg by mouth daily. Taking 7.5 mg on Monday, Tuesday. Thursday, Saturday and Sunday Takes 10 mg on Friday and Wednesday 02/05/17  Yes Donnamae Jude, MD  clindamycin (CLEOCIN) 150 MG capsule Take 2 capsules (300 mg total) by mouth every 6 (six) hours for 7 days. 05/16/17 05/23/17  Jolene Guyett A, PA-C  polyethylene glycol (MIRALAX / GLYCOLAX) packet Take 17 g by mouth every 3 (three) days. Patient not taking: Reported on 03/12/2017 02/05/17   Donnamae Jude, MD  PRESCRIPTION MEDICATION Pt has CPAP machine    [provider]    Family History Family History  Problem Relation Age of Onset  . Heart disease Mother   . Colon cancer Neg Hx   . Esophageal cancer Neg Hx   . Stomach cancer Neg Hx   . Pancreatic cancer Neg Hx   . Liver disease Neg Hx   . Colon polyps Neg Hx     Social History Social History   Tobacco Use  . Smoking status: Former Smoker    Packs/day: 0.50     Years: 46.00    Pack years: 23.00    Types: Cigarettes  . Smokeless tobacco: Never Used  Substance Use Topics  . Alcohol use: No  . Drug use: No     Allergies   Pineapple concentrate   Review of Systems Review of Systems  Constitutional: Negative for chills and fever.  Respiratory: Negative for shortness of breath.   Cardiovascular: Negative for chest pain.  Musculoskeletal: Positive for arthralgias, gait problem and myalgias. Negative for joint swelling.  Skin: Positive for wound. Negative for rash.  Allergic/Immunologic: Positive for immunocompromised state.   Physical Exam Updated Vital Signs BP 103/76   Pulse 90   Resp (!) 26   Ht 5\' 9"  (1.753 m)   Wt 113.4 kg (250 lb)   SpO2 95%   BMI 36.92 kg/m   Physical Exam  Constitutional: He appears well-developed.  HENT:  Head: Normocephalic.  Eyes: Conjunctivae are normal.  Neck: Neck supple.  Cardiovascular: Normal rate. An irregularly irregular rhythm present.  No murmur heard. Pulmonary/Chest: Effort normal.  Abdominal: Soft. Bowel sounds are normal. He exhibits no distension and no mass. There is no tenderness. There is no rebound and no guarding. No hernia.  Protuberant abdomen.  Musculoskeletal:  The toenail on the right hallux is partially involved with surrounding dried blood.  No paronychia.  Edema noted to the right foot, extending of the right ankle, more prevalent over the medial malleolus.  Mild edema to the bilateral calves, right greater than left.  Venous stasis dermatitis is present to the bilateral lower legs.  No tenderness to palpation over the bilateral thighs.  Neurological: He is alert.  Skin: Skin is warm and dry.  Psychiatric: His behavior is normal.  Nursing note and vitals reviewed.    ED Treatments / Results  Labs (all labs ordered are listed, but only abnormal results are displayed) Labs Reviewed  COMPREHENSIVE METABOLIC PANEL - Abnormal; Notable for the following components:       Result Value   Glucose, Bld 162 (*)    Creatinine, Ser 1.25 (*)    GFR calc non Af Amer 55 (*)  All other components within normal limits  CBC WITH DIFFERENTIAL/PLATELET - Abnormal; Notable for the following components:   Platelets 140 (*)    All other components within normal limits  PROTIME-INR - Abnormal; Notable for the following components:   Prothrombin Time 24.1 (*)    All other components within normal limits  I-STAT CG4 LACTIC ACID, ED  I-STAT CG4 LACTIC ACID, ED    EKG  EKG Interpretation None       Radiology Dg Foot Complete Right  Result Date: 05/16/2017 CLINICAL DATA:  Swelling x3 days. EXAM: RIGHT FOOT COMPLETE - 3+ VIEW COMPARISON:  None. FINDINGS: Generalized soft tissue swelling of the foot and ankle. No bone destruction. Mild osteoarthritic joint space narrowing of the DIP, PIP, interphalangeal and first MTP joints of the foot. Degenerative spurring is seen across the dorsum of the midfoot articulations in particular the talonavicular joint. Plantar and dorsal calcaneal enthesopathy. No frank bone destruction. No acute fracture. IMPRESSION: 1. Generalized soft tissue swelling of the foot and ankle without frank bone destruction. 2. Osteoarthritis of the toes and dorsum of the midfoot. No acute fracture nor joint dislocations. 3. Calcaneal spurs. Electronically Signed   By: Ashley Royalty M.D.   On: 05/16/2017 16:44    Procedures Procedures (including critical care time)  Medications Ordered in ED Medications  clindamycin (CLEOCIN) IVPB 600 mg (0 mg Intravenous Stopped 05/16/17 1835)     Initial Impression / Assessment and Plan / ED Course  I have reviewed the triage vital signs and the nursing notes.  Pertinent labs & imaging results that were available during my care of the patient were reviewed by me and considered in my medical decision making (see chart for details).     76 year old male with a history of with a history of chronic atrial fibrillation  on Coumadin, DM Type II, CHF, and OSA who presents to the emergency department with right lower leg pain and swelling x3 days.  Physical exam, the patient is tender over the medial aspect of the right foot from the hallux to the medial malleolus.  There is minimal warmth and moderate edema as compared to the left foot.  No constitutional symptoms.  The patient was seen and evaluated with Dr. Tamera Punt, attending physician. Glucose 162 today.  Labs are otherwise reassuring.  X-ray of the right foot with generalized soft tissue swelling in the foot and ankle without frank bone destruction.  We will treat the patient with 1 course of IV clindamycin in the ED. spoke with Dr. Vanetta Shawl from the [facility] Medicine team who scheduled a outpatient follow-up appointment with Dr. Annabell Sabal tomorrow at 4 PM.  The patient typically ambulates with a cane at baseline, but has a walker at home.  Encourage the patient to ambulate with the walker until pain with weightbearing has improved foot.  Will discharge the patient home with oral clindamycin, outpatient follow-up.  Strict return precautions given.  He is hemodynamically stable.  Manual blood pressure has been checked several times throughout his visit as his blood pressure cuff is not set while on his arm was recorded lower than actual readings. Last manual reading was 103/76. No acute distress.  The patient is safe for discharge at this time.  Final Clinical Impressions(s) / ED Diagnoses   Final diagnoses:  Cellulitis and abscess of toe of right foot    ED Discharge Orders        Ordered    clindamycin (CLEOCIN) 150 MG capsule  Every 6 hours  05/16/17 1920       Joanne Gavel, PA-C 05/16/17 1936    Malvin Johns, MD 05/17/17 1324

## 2017-05-17 ENCOUNTER — Other Ambulatory Visit: Payer: Self-pay

## 2017-05-17 ENCOUNTER — Ambulatory Visit (INDEPENDENT_AMBULATORY_CARE_PROVIDER_SITE_OTHER): Payer: Medicare HMO | Admitting: Family Medicine

## 2017-05-17 ENCOUNTER — Encounter: Payer: Self-pay | Admitting: Family Medicine

## 2017-05-17 VITALS — BP 118/60 | HR 73 | Temp 98.1°F | Ht 69.0 in | Wt 250.6 lb

## 2017-05-17 DIAGNOSIS — I10 Essential (primary) hypertension: Secondary | ICD-10-CM

## 2017-05-17 DIAGNOSIS — R739 Hyperglycemia, unspecified: Secondary | ICD-10-CM | POA: Diagnosis not present

## 2017-05-17 DIAGNOSIS — L03818 Cellulitis of other sites: Secondary | ICD-10-CM

## 2017-05-17 DIAGNOSIS — R531 Weakness: Secondary | ICD-10-CM

## 2017-05-17 DIAGNOSIS — R600 Localized edema: Secondary | ICD-10-CM | POA: Diagnosis not present

## 2017-05-17 MED ORDER — DOXYCYCLINE HYCLATE 100 MG PO TABS: 100.0000 mg | ORAL_TABLET | Freq: Two times a day (BID) | ORAL | 0 refills | Status: DC

## 2017-05-17 MED ORDER — FUROSEMIDE 40 MG PO TABS: 40.0000 mg | ORAL_TABLET | Freq: Two times a day (BID) | ORAL | 3 refills | Status: DC

## 2017-05-17 NOTE — Patient Instructions (Signed)
Stop the Clindamycin.  Start the doxycycline.  Take this 1 pill in the morning and 1 pill the evening for the next 10 days.  If you start having worsening redness or pain or swelling despite being on this medicine come back and see [place].  Otherwise we will see you back next Wednesday or Thursday.  I have also done a prescription for the rolling walker for you.   You do have a skin infection in your toes and foot.

## 2017-05-18 NOTE — Progress Notes (Signed)
Subjective:    Matthew Edwards is a 76 y.o. male who presents to [facility] today for FU for presumed cellulitis:  1.  Cellulitis:  Patient seen in ED yesterday for pain in his Right great toe.  Found to have redness and swelling of foot, diagnosed as cellulitis stemming from broken nail of Right great toe that occurred when he stubbed his toe 3-5 days ago (not a great historian).  Discharged on clindamycin.  He presents about 24 hours later this PM.  Hasn't noted any change in his swelling, pain, or redness of that foot.  Has only taken 1 dose of clindamycin.  No N/V/abdominal pain.  No fevers.  No drainage from toe or broken nail.   2.  Weakness:  Ongoing issue.  Mostly proximal in hips.  Has been doing home PT with some improvement.  Poor nail hygiene also makes it difficult for him to walk at home.  Asking for rolling walker with seat.    ROS as above per HPI.    The following portions of the patient's history were reviewed and updated as appropriate: allergies, current medications, past medical history, family and social history, and problem list. Patient is a nonsmoker.    PMH reviewed.  Medications reviewed.    Objective:   Physical Exam BP 118/60   Pulse 73   Temp 98.1 F (36.7 C) (Oral)   Ht 5\' 9"  (1.753 m)   Wt 250 lb 9.6 oz (113.7 kg)   SpO2 93%   BMI 37.01 kg/m  Gen:  Alert, cooperative patient who appears stated age in no acute distress.  Vital signs reviewed. HEENT: EOMI,  MMM MSK:  Strength diminished to 3-4/5 BL LE's Ext:   - Trace LE edema left ankle.  +2 edema Right ankle and foot. - Very poor foot/nail hygiene BL feet. Onychomycosis seen all toenails.   - Right foot:  With erythema most easily seen plantar aspect of foot, but difficult to discern exact borders due to dark skin.  Erythema extends from Right great toe proximally.  Also with some dried blood Right great toenail.  About half of the nail is gone due to traumatic avulsion.  +2 edema throughout most of foot.   TTP from Right great toe to dorsum of foot about mid-foot.  No fluctuance/induration/evidence of abscess.  Imp/Plan: 1. Right foot cellulitis: - treating as same.  Not likely gout due to extensive redness and swelling.  NO evidence of abscess - Adherence seems to be an issue.  Clinda is prescribed multiple times a day, and he's already missed some doses.  Also, clindamycin only has about 75% activity against MRSA.   - Switch to doxy for better adherence and coverage of possible MRSA.   - FU in ~7 days, or sooner if worsening.   2.  Weakness:   - ongoing issue.  Multifactorial - I think he would benefit from rolling walker.  Will write for one today.

## 2017-05-27 ENCOUNTER — Other Ambulatory Visit: Payer: Self-pay | Admitting: *Deleted

## 2017-05-27 DIAGNOSIS — I2510 Atherosclerotic heart disease of native coronary artery without angina pectoris: Secondary | ICD-10-CM | POA: Diagnosis not present

## 2017-05-27 DIAGNOSIS — M48061 Spinal stenosis, lumbar region without neurogenic claudication: Secondary | ICD-10-CM | POA: Diagnosis not present

## 2017-05-27 DIAGNOSIS — J449 Chronic obstructive pulmonary disease, unspecified: Secondary | ICD-10-CM | POA: Diagnosis not present

## 2017-05-27 DIAGNOSIS — R159 Full incontinence of feces: Secondary | ICD-10-CM | POA: Diagnosis not present

## 2017-05-27 DIAGNOSIS — G3184 Mild cognitive impairment, so stated: Secondary | ICD-10-CM | POA: Diagnosis not present

## 2017-05-27 DIAGNOSIS — I482 Chronic atrial fibrillation: Secondary | ICD-10-CM | POA: Diagnosis not present

## 2017-05-27 DIAGNOSIS — E1143 Type 2 diabetes mellitus with diabetic autonomic (poly)neuropathy: Secondary | ICD-10-CM | POA: Diagnosis not present

## 2017-05-27 DIAGNOSIS — I5032 Chronic diastolic (congestive) heart failure: Secondary | ICD-10-CM | POA: Diagnosis not present

## 2017-05-27 DIAGNOSIS — I110 Hypertensive heart disease with heart failure: Secondary | ICD-10-CM | POA: Diagnosis not present

## 2017-05-27 LAB — POCT INR: INR: 1.6

## 2017-05-28 ENCOUNTER — Encounter: Payer: Self-pay | Admitting: Podiatry

## 2017-05-28 ENCOUNTER — Ambulatory Visit: Payer: Medicare HMO | Admitting: Podiatry

## 2017-05-28 DIAGNOSIS — M79674 Pain in right toe(s): Secondary | ICD-10-CM

## 2017-05-28 DIAGNOSIS — B351 Tinea unguium: Secondary | ICD-10-CM

## 2017-05-28 DIAGNOSIS — M79675 Pain in left toe(s): Secondary | ICD-10-CM | POA: Diagnosis not present

## 2017-05-28 DIAGNOSIS — D689 Coagulation defect, unspecified: Secondary | ICD-10-CM

## 2017-05-28 NOTE — Progress Notes (Signed)
Complaint:  Visit Type: Patient returns to my office for continued preventative foot care services. Complaint: Patient states" my nails have grown long and thick and become painful to walk and wear shoes" Patient has been diagnosed with DM with no foot neuropathy.. The patient presents for preventative foot care services. No changes to ROS.  Patient has not been seen for over 10 months.  Podiatric Exam Vascular: dorsalis pedis are palpable bilateral.  Posterior tibial pulses are non pallpable  B/L. Capillary return is immediate. Temperature gradient is WNL. Skin turgor WNL  Sensorium: Absent  Semmes Weinstein monofilament test. Normal tactile sensation bilaterally. Nail Exam: Pt has thick disfigured discolored nails with subungual debris noted bilateral entire nail hallux through fifth toenails Ulcer Exam: There is no evidence of ulcer or pre-ulcerative changes or infection. Orthopedic Exam: Muscle tone and strength are WNL. No limitations in general ROM. No crepitus or effusions noted. Hammer toes  B/L.  Bony prominences are unremarkable. Skin: No Porokeratosis. No infection or ulcers  Diagnosis:  Onychomycosis, , Pain in right toe, pain in left toes  Treatment & Plan Procedures and Treatment: Consent by patient was obtained for treatment procedures.   Debridement of mycotic and hypertrophic toenails, 1 through 5 bilateral and clearing of subungual debris. No ulceration, no infection noted.  Return Visit-Office Procedure: Patient instructed to return to the office for a follow up visit 3 months for continued evaluation and treatment.    Gardiner Barefoot DPM

## 2017-06-09 DIAGNOSIS — I482 Chronic atrial fibrillation: Secondary | ICD-10-CM | POA: Diagnosis not present

## 2017-06-09 DIAGNOSIS — I2510 Atherosclerotic heart disease of native coronary artery without angina pectoris: Secondary | ICD-10-CM | POA: Diagnosis not present

## 2017-06-09 DIAGNOSIS — E1143 Type 2 diabetes mellitus with diabetic autonomic (poly)neuropathy: Secondary | ICD-10-CM | POA: Diagnosis not present

## 2017-06-09 DIAGNOSIS — R159 Full incontinence of feces: Secondary | ICD-10-CM | POA: Diagnosis not present

## 2017-06-09 DIAGNOSIS — J449 Chronic obstructive pulmonary disease, unspecified: Secondary | ICD-10-CM | POA: Diagnosis not present

## 2017-06-09 DIAGNOSIS — G3184 Mild cognitive impairment, so stated: Secondary | ICD-10-CM | POA: Diagnosis not present

## 2017-06-09 DIAGNOSIS — M48061 Spinal stenosis, lumbar region without neurogenic claudication: Secondary | ICD-10-CM | POA: Diagnosis not present

## 2017-06-09 DIAGNOSIS — I5032 Chronic diastolic (congestive) heart failure: Secondary | ICD-10-CM | POA: Diagnosis not present

## 2017-06-09 DIAGNOSIS — I110 Hypertensive heart disease with heart failure: Secondary | ICD-10-CM | POA: Diagnosis not present

## 2017-06-21 ENCOUNTER — Telehealth: Payer: Self-pay | Admitting: Family Medicine

## 2017-06-21 NOTE — Telephone Encounter (Signed)
Pt missed appointment last Wednesday, 1/16, with [facility]. Nurse just wanted to inform PCP

## 2017-06-21 NOTE — Telephone Encounter (Signed)
Will forward to MD to inform. Jazmin Hartsell,CMA

## 2017-06-23 DIAGNOSIS — J449 Chronic obstructive pulmonary disease, unspecified: Secondary | ICD-10-CM | POA: Diagnosis not present

## 2017-06-23 DIAGNOSIS — I2510 Atherosclerotic heart disease of native coronary artery without angina pectoris: Secondary | ICD-10-CM | POA: Diagnosis not present

## 2017-06-23 DIAGNOSIS — M48061 Spinal stenosis, lumbar region without neurogenic claudication: Secondary | ICD-10-CM | POA: Diagnosis not present

## 2017-06-23 DIAGNOSIS — R159 Full incontinence of feces: Secondary | ICD-10-CM | POA: Diagnosis not present

## 2017-06-23 DIAGNOSIS — G3184 Mild cognitive impairment, so stated: Secondary | ICD-10-CM | POA: Diagnosis not present

## 2017-06-23 DIAGNOSIS — I5032 Chronic diastolic (congestive) heart failure: Secondary | ICD-10-CM | POA: Diagnosis not present

## 2017-06-23 DIAGNOSIS — I110 Hypertensive heart disease with heart failure: Secondary | ICD-10-CM | POA: Diagnosis not present

## 2017-06-23 DIAGNOSIS — E1143 Type 2 diabetes mellitus with diabetic autonomic (poly)neuropathy: Secondary | ICD-10-CM | POA: Diagnosis not present

## 2017-06-23 DIAGNOSIS — I482 Chronic atrial fibrillation: Secondary | ICD-10-CM | POA: Diagnosis not present

## 2017-07-08 ENCOUNTER — Telehealth: Payer: Self-pay

## 2017-07-08 NOTE — Telephone Encounter (Signed)
Nurse from [facility] called, requesting verbal orders for pts INR. Last INR 06/09/2017. Verbal orders given.

## 2017-07-09 DIAGNOSIS — R159 Full incontinence of feces: Secondary | ICD-10-CM | POA: Diagnosis not present

## 2017-07-09 DIAGNOSIS — G3184 Mild cognitive impairment, so stated: Secondary | ICD-10-CM | POA: Diagnosis not present

## 2017-07-09 DIAGNOSIS — Z7901 Long term (current) use of anticoagulants: Secondary | ICD-10-CM | POA: Diagnosis not present

## 2017-07-09 DIAGNOSIS — I110 Hypertensive heart disease with heart failure: Secondary | ICD-10-CM | POA: Diagnosis not present

## 2017-07-09 DIAGNOSIS — I482 Chronic atrial fibrillation: Secondary | ICD-10-CM | POA: Diagnosis not present

## 2017-07-09 DIAGNOSIS — I5032 Chronic diastolic (congestive) heart failure: Secondary | ICD-10-CM | POA: Diagnosis not present

## 2017-07-09 DIAGNOSIS — J449 Chronic obstructive pulmonary disease, unspecified: Secondary | ICD-10-CM | POA: Diagnosis not present

## 2017-07-09 DIAGNOSIS — M48061 Spinal stenosis, lumbar region without neurogenic claudication: Secondary | ICD-10-CM | POA: Diagnosis not present

## 2017-07-09 DIAGNOSIS — E1143 Type 2 diabetes mellitus with diabetic autonomic (poly)neuropathy: Secondary | ICD-10-CM | POA: Diagnosis not present

## 2017-07-09 DIAGNOSIS — I2510 Atherosclerotic heart disease of native coronary artery without angina pectoris: Secondary | ICD-10-CM | POA: Diagnosis not present

## 2017-08-25 ENCOUNTER — Ambulatory Visit: Payer: Medicare HMO | Admitting: Podiatry

## 2017-09-03 ENCOUNTER — Ambulatory Visit: Payer: Medicare HMO

## 2017-09-13 ENCOUNTER — Encounter: Payer: Self-pay | Admitting: Family Medicine

## 2017-09-13 ENCOUNTER — Ambulatory Visit: Payer: Medicare HMO | Admitting: Family Medicine

## 2017-09-13 NOTE — Progress Notes (Signed)
Patient did not keep appointment today. He may call to reschedule.  

## 2017-09-28 ENCOUNTER — Telehealth: Payer: Self-pay

## 2017-09-28 NOTE — Telephone Encounter (Signed)
Pt's daughter calling regarding pt. States he has been complaining of pain near kidneys for 3 days. No other sx- denies burning with urination, fever. Wants pt to be evaluated. Scheduled with [name] tomorrow at [phone]. Wallace Cullens, RN

## 2017-09-29 ENCOUNTER — Ambulatory Visit: Payer: Medicare HMO | Admitting: Family Medicine

## 2017-11-11 ENCOUNTER — Ambulatory Visit (INDEPENDENT_AMBULATORY_CARE_PROVIDER_SITE_OTHER): Payer: Medicare HMO | Admitting: Internal Medicine

## 2017-11-11 ENCOUNTER — Encounter: Payer: Self-pay | Admitting: Internal Medicine

## 2017-11-11 VITALS — BP 120/70 | HR 90 | Temp 98.3°F | Wt 244.0 lb

## 2017-11-11 DIAGNOSIS — E118 Type 2 diabetes mellitus with unspecified complications: Secondary | ICD-10-CM | POA: Diagnosis not present

## 2017-11-11 DIAGNOSIS — L03311 Cellulitis of abdominal wall: Secondary | ICD-10-CM | POA: Diagnosis not present

## 2017-11-11 DIAGNOSIS — I359 Nonrheumatic aortic valve disorder, unspecified: Secondary | ICD-10-CM

## 2017-11-11 DIAGNOSIS — Z7901 Long term (current) use of anticoagulants: Secondary | ICD-10-CM | POA: Diagnosis not present

## 2017-11-11 LAB — POCT GLYCOSYLATED HEMOGLOBIN (HGB A1C): HbA1c, POC (controlled diabetic range): 7.7 % — AB (ref 0.0–7.0)

## 2017-11-11 LAB — POCT INR: INR: 1.9 — AB (ref 2.0–3.0)

## 2017-11-11 MED ORDER — SULFAMETHOXAZOLE-TRIMETHOPRIM 800-160 MG PO TABS: 1.0000 | ORAL_TABLET | Freq: Two times a day (BID) | ORAL | 0 refills | Status: DC

## 2017-11-11 NOTE — Progress Notes (Signed)
[facility] Progress Note  Subjective:  Matthew Edwards is a 77 y.o. male with history of HTN, CAD, COPD, OSA, s/p AVR, afib on coumadin, and T2DM who presents for rash of abdomen. He is accompanied by his son-in-law. Patient first noticed skin irritation at base of left abdomen 3-4 days ago. It does not hurt much unless his belt or pants rubs against the area. He has tried lotrimin antifungal cream on the area. He had not noticed redness of abdomen. He otherwise has felt well with normal appetite. He denies fever but says he may have had chills last night. He does not feel weak. He was treated for cellulitis of his right great toe last December with doxycycline. No other recent antibiotics. No recent hospitalizations. ROS: No vomiting, no diarrhea   Allergies  Allergen Reactions  . Pineapple Concentrate Nausea And Vomiting    Social History   Tobacco Use  . Smoking status: Former Smoker    Packs/day: 0.50    Years: 46.00    Pack years: 23.00    Types: Cigarettes  . Smokeless tobacco: Never Used  Substance Use Topics  . Alcohol use: No    Objective: Blood pressure 120/70, pulse 90, temperature 98.3 F (36.8 C), temperature source Oral, weight 244 lb (110.7 kg), SpO2 96 %. Body mass index is 36.03 kg/m. Constitutional: Obese male in NAD HENT: MMM Cardiovascular: RRR, S1, S2, no m/r/g.  Abdominal: Protuberant abdomen. Soft. +BS, NT Neurological: Answers questions appropriately. Hard of hearing.  Skin: Skin breakdown at base of left abdomen (not pictured in photo below) with sloughing of skin about 6x4 cm with minimal drainage. Surrounding erythema past umbilicus with increased warmth of skin. No edema.  Psychiatric: Normal mood and affect.  Vitals reviewed      Assessment/Plan: Cellulitis of abdominal wall - Patient well appearing despite extent of rash. Precepted with Dr. Gwendlyn Deutscher. Given normal vital signs, will treat cellulitis outpatient with bactrim DS  twice daily for Staph and Strep coverage with close follow-up. Provided 7 days of antibiotics for initial prescription but may need longer course. Also recommended topical antibiotics (provided bacitracin), airing out lower abdominal wound and wearing loose pants; provided non-stick gauze pad to help reduce friction. Intertrigo likely inciting cause.  - Patient has history of dementia per medical record and lives alone but states he takes his own medication and taught-back need to take medication twice daily with first dose tonight.  - Will need to follow-up tomorrow, 6/14, for monitoring of progression. Counseled on reasons to go to the emergency room (confusion, fevers, vomiting).  - Abdomen very protuberant but has been previously noted, patient denies discomfort, and had normal LFTs in December.  Type 2 diabetes mellitus with complication, without long-term current use of insulin (HCC) - A1c slightly elevated at 7.7 compared to 7.5 last July. Continue glipizide 10 mg daily and consider adding metformin at next check-up now that GI upset improved.  - Due for general check-up.   Chronic anticoagulation - INR subtherapeutic in setting of missed dose. To resume normal schedule. As will be taking bactrim, would benefit from recheck next week.   HM: Obtained A1c.   Follow-up tomorrow, 6/14, for monitoring of cellulitis.   Would recommend close follow-up thereafter for chronic medical problems as there was discussion of considering assisted living last year. Patient had been receiving some home health nursing services with INR checks but is not currently--though he says this should resume again soon. May benefit from assessment in  [facility] for fuller discussion of needs at home.   Olene Floss, MD [place], PGY-3

## 2017-11-11 NOTE — Assessment & Plan Note (Addendum)
-   INR subtherapeutic in setting of missed dose. To resume normal schedule. As will be taking bactrim, would benefit from recheck next week.

## 2017-11-11 NOTE — Assessment & Plan Note (Signed)
-   Patient well appearing despite extent of rash. Precepted with Dr. Gwendlyn Deutscher. Given normal vital signs, will treat cellulitis outpatient with bactrim DS twice daily for Staph and Strep coverage with close follow-up. Provided 7 days of antibiotics for initial prescription but may need longer course. Also recommended topical antibiotics (provided bacitracin), airing out lower abdominal wound and wearing loose pants; provided non-stick gauze pad to help reduce friction. Intertrigo likely inciting cause.  - Patient has history of dementia per medical record and lives alone but states he takes his own medication and taught-back need to take medication twice daily with first dose tonight.  - Will need to follow-up tomorrow, 6/14, for monitoring of progression. Counseled on reasons to go to the emergency room (confusion, fevers, vomiting).  - Abdomen very protuberant but has been previously noted, patient denies discomfort, and had normal LFTs in December.

## 2017-11-11 NOTE — Patient Instructions (Signed)
[name],  Please take the antibiotic bactrim twice daily. Please return tomorrow for an appointment to ensure wound is not worsening.  Keep lower wound open to air. Apply antibiotic ointment twice daily. Wear loose clothes.  If you have confusion, continued vomiting, or fevers, please go to the emergency department.  Best, Dr. Ola Spurr   Cellulitis, Adult Cellulitis is a skin infection. The infected area is usually red and sore. This condition occurs most often in the arms and lower legs. It is very important to get treated for this condition. Follow these instructions at home:  Take over-the-counter and prescription medicines only as told by your doctor.  If you were prescribed an antibiotic medicine, take it as told by your doctor. Do not stop taking the antibiotic even if you start to feel better.  Drink enough fluid to keep your pee (urine) clear or pale yellow.  Do not touch or rub the infected area.  Raise (elevate) the infected area above the level of your heart while you are sitting or lying down.  Place warm or cold wet cloths (warm or cold compresses) on the infected area. Do this as told by your doctor.  Keep all follow-up visits as told by your doctor. This is important. These visits let your doctor make sure your infection is not getting worse. Contact a doctor if:  You have a fever.  Your symptoms do not get better after 1-2 days of treatment.  Your bone or joint under the infected area starts to hurt after the skin has healed.  Your infection comes back. This can happen in the same area or another area.  You have a swollen bump in the infected area.  You have new symptoms.  You feel ill and also have muscle aches and pains. Get help right away if:  Your symptoms get worse.  You feel very sleepy.  You throw up (vomit) or have watery poop (diarrhea) for a long time.  There are red streaks coming from the infected area.  Your red area gets  larger.  Your red area turns darker. This information is not intended to replace advice given to you by your health care provider. Make sure you discuss any questions you have with your health care provider. Document Released: 11/04/2007 Document Revised: 10/24/2015 Document Reviewed: 03/27/2015 Elsevier Interactive Patient Education  2018 [employer].

## 2017-11-11 NOTE — Assessment & Plan Note (Signed)
-   A1c slightly elevated at 7.7 compared to 7.5 last July. Continue glipizide 10 mg daily and consider adding metformin at next check-up now that GI upset improved.  - Due for general check-up.

## 2017-11-12 ENCOUNTER — Encounter: Payer: Self-pay | Admitting: Family Medicine

## 2017-11-12 ENCOUNTER — Ambulatory Visit (INDEPENDENT_AMBULATORY_CARE_PROVIDER_SITE_OTHER): Payer: Medicare HMO | Admitting: Family Medicine

## 2017-11-12 ENCOUNTER — Encounter: Payer: Self-pay | Admitting: Internal Medicine

## 2017-11-12 ENCOUNTER — Other Ambulatory Visit: Payer: Self-pay

## 2017-11-12 VITALS — BP 142/98 | HR 84 | Temp 97.9°F | Ht 69.0 in | Wt 244.0 lb

## 2017-11-12 DIAGNOSIS — L03311 Cellulitis of abdominal wall: Secondary | ICD-10-CM | POA: Diagnosis not present

## 2017-11-12 NOTE — Assessment & Plan Note (Signed)
Acute.  No signs of abscess.  Likely cellulitis appropriately treated with Bactrim.  Patient seems to be tolerating medication.  No signs of systemic infection. - Continue Bactrim twice daily for 7 days - New dry dressings applied to wound with additional supplies and patient's son-in-law was instructed how to change dressings - Reviewed return precautions

## 2017-11-12 NOTE — Patient Instructions (Signed)
Thank you for coming in to see [place] today. Please see below to review our plan for today's visit.  To do the Bactrim twice daily for 7 days.  Be sure to keep the area dry to allow proper healing.  If the redness begins to spread or there is drainage at the site, come back to the clinic immediately.  Please call the clinic at [phone] if your symptoms worsen or you have any concerns. It was our pleasure to serve you.  Harriet Butte, [place], PGY-2

## 2017-11-12 NOTE — Progress Notes (Signed)
   Subjective   Patient ID: RYSZARD SOCARRAS    DOB: 07-28-1940, 77 y.o. male   MRN: [id number]  CC: "Cellulitis follow-up"  HPI: SLOAN TAKAGI is a 77 y.o. male who presents for a same day appointment for the following:  Cellulitis: Localized to left lower abdomen.  Patient states onset approximately 1 week ago.  Denies history of fevers or chills, nausea or vomiting, purulent drainage or bleeding.  Patient was seen 11/13/2017 and prescribed Bactrim twice daily for 7 days to treat for presumed cellulitis.  A dry gauze was also placed over the wound.  He is here today for follow-up to make sure that there is no progression of the erythema.  She has tolerated 2 doses of his Bactrim.  He has no complaints as of this morning.  ROS: see HPI for pertinent.  [place]: Pulmonary HTN, HFpEF, DM, spinal stenosis, a-fib, CAD, HTN, COPD, OSA, GERD, depression, alcohol use disorder.  Surgical history coronary stent x2, CABG (2012), TAVR (2012), partial proctectomy.  Family history heart disease.  Smoking status reviewed. Medications reviewed.  Objective   BP (!) 142/98   Pulse 84   Temp 97.9 F (36.6 C) (Oral)   Ht 5\' 9"  (1.753 m)   Wt 244 lb (110.7 kg)   SpO2 99%   BMI 36.03 kg/m  Vitals and nursing note reviewed.  General: NAD with non-toxic appearance HEENT: normocephalic, atraumatic, moist mucous membranes Neck: supple, non-tender without lymphadenopathy Cardiovascular: regular rate and rhythm without murmurs, rubs, or gallops Lungs: clear to auscultation bilaterally with normal work of breathing Abdomen: obese, soft, non-tender, non-distended, normoactive bowel sounds Skin: warm, dry, cap refill < 2 seconds, minimally erythematous desquamating region on left lower abdomen with dry dressings intact, no signs of purulence or fluctuance Extremities: warm and well perfused, normal tone, no edema      Assessment & Plan   Cellulitis of abdominal wall Acute.  No signs of abscess.  Likely  cellulitis appropriately treated with Bactrim.  Patient seems to be tolerating medication.  No signs of systemic infection. - Continue Bactrim twice daily for 7 days - New dry dressings applied to wound with additional supplies and patient's son-in-law was instructed how to change dressings - Reviewed return precautions  No orders of the defined types were placed in this encounter.  No orders of the defined types were placed in this encounter.   Harriet Butte, [place], PGY-2 11/12/2017, 11:59 AM

## 2017-11-18 ENCOUNTER — Encounter: Payer: Self-pay | Admitting: Family Medicine

## 2017-11-18 ENCOUNTER — Ambulatory Visit (INDEPENDENT_AMBULATORY_CARE_PROVIDER_SITE_OTHER): Payer: Medicare HMO | Admitting: Family Medicine

## 2017-11-18 ENCOUNTER — Other Ambulatory Visit: Payer: Self-pay

## 2017-11-18 VITALS — BP 128/68 | HR 71 | Temp 98.4°F | Ht 69.0 in | Wt 242.0 lb

## 2017-11-18 DIAGNOSIS — E118 Type 2 diabetes mellitus with unspecified complications: Secondary | ICD-10-CM

## 2017-11-18 DIAGNOSIS — I482 Chronic atrial fibrillation, unspecified: Secondary | ICD-10-CM

## 2017-11-18 DIAGNOSIS — L03311 Cellulitis of abdominal wall: Secondary | ICD-10-CM | POA: Diagnosis not present

## 2017-11-18 DIAGNOSIS — Z7189 Other specified counseling: Secondary | ICD-10-CM | POA: Diagnosis not present

## 2017-11-18 DIAGNOSIS — I10 Essential (primary) hypertension: Secondary | ICD-10-CM

## 2017-11-18 NOTE — Progress Notes (Signed)
   Subjective:    Patient ID: Matthew Edwards is a 77 y.o. male presenting with home health  on 11/18/2017  HPI: Daughter reports that her dad is not doing well. 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 Medicaid pending.  Reports lost of cognition, incontinence. There is a decline. The family finds themselves unable to provide all the care he needs. We have previously had Matthew Edwards in and that was good but only lasted a limited amount of time. She is specifically worried about his memory and his INR. His other daughter is with him also, and she reports that he is able to keep up with housekeeping on his own. He calls her for food. There is some discussion around placement for him, though this might be a financial burden. He has some trouble getting out due to stool incontinence which has resulted after anal sphincter surgery.  Review of Systems  Constitutional: Negative for chills and fever.  HENT: Negative for congestion, ear discharge and sore throat.   Eyes: Negative for discharge and redness.  Respiratory: Negative for cough and shortness of breath.   Cardiovascular: Negative for leg swelling.  Gastrointestinal: Negative for abdominal pain and vomiting.  Musculoskeletal: Negative for back pain.  Skin: Negative for rash.      Objective:    BP 128/68   Pulse 71   Temp 98.4 F (36.9 C) (Oral)   Ht 5\' 9"  (1.753 m)   Wt 242 lb (109.8 kg)   SpO2 96%   BMI 35.74 kg/m  Physical Exam  Constitutional: He appears well-developed and well-nourished. No distress.  HENT:  Head: Normocephalic and atraumatic.  Eyes: No scleral icterus.  Neck: Neck supple.  Cardiovascular: Normal rate.  Pulmonary/Chest: Effort normal.  Abdominal: Soft.  Musculoskeletal: He exhibits no edema.  Neurological: He is alert.  Skin: Skin is warm.  Psychiatric: He has a normal mood and affect.  Vitals reviewed.   Media Information         Assessment & Plan:   Problem List Items Addressed This Visit      Unprioritized   Essential hypertension    BP is well cotnrolled, on Lotensin. Toprol. diltiazem      Chronic atrial fibrillation (HCC)    Last INR 1.9--continue Coumadin      Type 2 diabetes mellitus with complication, without long-term current use of insulin (HCC)    Last A1C is 7.7. Continue Glucotorol      Cellulitis of abdominal wall    Improving-finish Bactrim      Counseling and coordination of care    Lengthy conversation with 2 daughters and patient about desires. He is currently living alone in an apartment. They cannot afford in home care. Discussed options of inpatient rehab, SNF, assisted living, moving in with relative. There are a lot of options, and the family and social worker will need to look at all of this to determine best location for him. We will reconvene with all interested members (to include social work) present to discuss in one month.       Other Visit Diagnoses    Encounter for counseling regarding advance directives    -  Primary   info given for family to discuss and make decisions.      Total face-to-face time with patient: 25 minutes. Over 50% of encounter was spent on counseling and coordination of care. Return in about 1 month (around 12/16/2017).  Matthew Edwards 11/18/2017 4:27 PM

## 2017-11-18 NOTE — Patient Instructions (Signed)
Advance Directive Advance directives are legal documents that let you make choices ahead of time about your health care and medical treatment in case you become unable to communicate for yourself. Advance directives are a way for you to communicate your wishes to family, friends, and health care providers. This can help convey your decisions about end-of-life care if you become unable to communicate. Discussing and writing advance directives should happen over time rather than all at once. Advance directives can be changed depending on your situation and what you want, even after you have signed the advance directives. If you do not have an advance directive, some states assign family decision makers to act on your behalf based on how closely you are related to them. Each state has its own laws regarding advance directives. You may want to check with your health care provider, attorney, or state representative about the laws in your state. There are different types of advance directives, such as:  Medical power of attorney.  Living will.  Do not resuscitate (DNR) or do not attempt resuscitation (DNAR) order. Health care proxy and medical power of attorney A health care proxy, also called a health care agent, is a person who is appointed to make medical decisions for you in cases in which you are unable to make the decisions yourself. Generally, people choose someone they know well and trust to represent their preferences. Make sure to ask this person for an agreement to act as your proxy. A proxy may have to exercise judgment in the event of a medical decision for which your wishes are not known. A medical power of attorney is a legal document that names your health care proxy. Depending on the laws in your state, after the document is written, it may also need to be:  Signed.  Notarized.  Dated.  Copied.  Witnessed.  Incorporated into your medical record. You may also want to appoint  someone to manage your financial affairs in a situation in which you are unable to do so. This is called a durable power of attorney for finances. It is a separate legal document from the durable power of attorney for health care. You may choose the same person or someone different from your health care proxy to act as your agent in financial matters. If you do not appoint a proxy, or if there is a concern that the proxy is not acting in your best interests, a court-appointed guardian may be designated to act on your behalf. Living will A living will is a set of instructions documenting your wishes about medical care when you cannot express them yourself. Health care providers should keep a copy of your living will in your medical record. You may want to give a copy to family members or friends. To alert caregivers in case of an emergency, you can place a card in your wallet to let them know that you have a living will and where they can find it. A living will is used if you become:  Terminally ill.  Incapacitated.  Unable to communicate or make decisions. Items to consider in your living will include:  The use or non-use of life-sustaining equipment, such as dialysis machines and breathing machines (ventilators).  A DNR or DNAR order, which is the instruction not to use cardiopulmonary resuscitation (CPR) if breathing or heartbeat stops.  The use or non-use of tube feeding.  Withholding of food and fluids.  Comfort (palliative) care when the goal becomes comfort rather than   a cure.  Organ and tissue donation. A living will does not give instructions for distributing your money and property if you should pass away. It is recommended that you seek the advice of a lawyer when writing a will. Decisions about taxes, beneficiaries, and asset distribution will be legally binding. This process can relieve your family and friends of any concerns surrounding disputes or questions that may come up about  the distribution of your assets. DNR or DNAR A DNR or DNAR order is a request not to have CPR in the event that your heart stops beating or you stop breathing. If a DNR or DNAR order has not been made and shared, a health care provider will try to help any patient whose heart has stopped or who has stopped breathing. If you plan to have surgery, talk with your health care provider about how your DNR or DNAR order will be followed if problems occur. Summary  Advance directives are the legal documents that allow you to make choices ahead of time about your health care and medical treatment in case you become unable to communicate for yourself.  The process of discussing and writing advance directives should happen over time. You can change the advance directives, even after you have signed them.  Advance directives include DNR or DNAR orders, living wills, and designating an agent as your medical power of attorney. This information is not intended to replace advice given to you by your health care provider. Make sure you discuss any questions you have with your health care provider. Document Released: 08/25/2007 Document Revised: 04/06/2016 Document Reviewed: 04/06/2016 Elsevier Interactive Patient Education  2017 Elsevier Inc.  

## 2017-11-20 ENCOUNTER — Encounter: Payer: Self-pay | Admitting: Family Medicine

## 2017-11-20 DIAGNOSIS — Z7189 Other specified counseling: Secondary | ICD-10-CM | POA: Insufficient documentation

## 2017-11-20 NOTE — Assessment & Plan Note (Signed)
BP is well cotnrolled, on Lotensin. Toprol. diltiazem

## 2017-11-20 NOTE — Assessment & Plan Note (Signed)
Last A1C is 7.7. Continue Glucotorol

## 2017-11-20 NOTE — Assessment & Plan Note (Addendum)
Lengthy conversation with 2 daughters and patient about desires. He is currently living alone in an apartment. They cannot afford in home care. Discussed options of inpatient rehab, SNF, assisted living, moving in with relative. There are a lot of options, and the family and social worker will need to look at all of this to determine best location for him. We will reconvene with all interested members (to include social work) present to discuss in one month.

## 2017-11-20 NOTE — Assessment & Plan Note (Signed)
Last INR 1.9--continue Coumadin

## 2017-11-20 NOTE — Assessment & Plan Note (Signed)
Improving-finish Bactrim

## 2017-12-23 ENCOUNTER — Encounter: Payer: Self-pay | Admitting: Family Medicine

## 2017-12-23 ENCOUNTER — Ambulatory Visit: Payer: Medicare HMO | Admitting: Family Medicine

## 2017-12-23 NOTE — Progress Notes (Signed)
Patient did not keep appointment today. He may call to reschedule.

## 2017-12-24 ENCOUNTER — Other Ambulatory Visit: Payer: Self-pay

## 2017-12-24 ENCOUNTER — Encounter (HOSPITAL_COMMUNITY): Payer: Self-pay | Admitting: Emergency Medicine

## 2017-12-24 ENCOUNTER — Emergency Department (HOSPITAL_COMMUNITY)
Admission: EM | Admit: 2017-12-24 | Discharge: 2017-12-25 | Disposition: A | Discharge status: Home / Self Care | Payer: Medicare HMO | Attending: Emergency Medicine | Admitting: Emergency Medicine

## 2017-12-24 ENCOUNTER — Emergency Department (HOSPITAL_COMMUNITY): Payer: Medicare HMO

## 2017-12-24 DIAGNOSIS — M25561 Pain in right knee: Secondary | ICD-10-CM | POA: Diagnosis not present

## 2017-12-24 DIAGNOSIS — I482 Chronic atrial fibrillation, unspecified: Secondary | ICD-10-CM

## 2017-12-24 DIAGNOSIS — R0602 Shortness of breath: Secondary | ICD-10-CM | POA: Diagnosis not present

## 2017-12-24 DIAGNOSIS — Z87891 Personal history of nicotine dependence: Secondary | ICD-10-CM | POA: Insufficient documentation

## 2017-12-24 DIAGNOSIS — Z79899 Other long term (current) drug therapy: Secondary | ICD-10-CM | POA: Diagnosis not present

## 2017-12-24 DIAGNOSIS — I10 Essential (primary) hypertension: Secondary | ICD-10-CM | POA: Diagnosis not present

## 2017-12-24 DIAGNOSIS — M25461 Effusion, right knee: Secondary | ICD-10-CM | POA: Diagnosis not present

## 2017-12-24 DIAGNOSIS — M25462 Effusion, left knee: Secondary | ICD-10-CM | POA: Diagnosis not present

## 2017-12-24 DIAGNOSIS — E119 Type 2 diabetes mellitus without complications: Secondary | ICD-10-CM | POA: Diagnosis not present

## 2017-12-24 DIAGNOSIS — M25562 Pain in left knee: Secondary | ICD-10-CM

## 2017-12-24 LAB — BASIC METABOLIC PANEL
Anion gap: 13 (ref 5–15)
BUN: 14 mg/dL (ref 8–23)
CO2: 27 mmol/L (ref 22–32)
Calcium: 9.8 mg/dL (ref 8.9–10.3)
Chloride: 97 mmol/L — ABNORMAL LOW (ref 98–111)
Creatinine, Ser: 1.15 mg/dL (ref 0.61–1.24)
GFR calc Af Amer: >60 mL/min (ref >60)
GFR calc non Af Amer: >60 mL/min (ref >60)
Glucose, Bld: 102 mg/dL — ABNORMAL HIGH (ref 70–99)
Potassium: 3.8 mmol/L (ref 3.5–5.1)
Sodium: 137 mmol/L (ref 135–145)

## 2017-12-24 LAB — CBC
HCT: 47.8 % (ref 39.0–52.0)
Hemoglobin: 15.3 g/dL (ref 13.0–17.0)
MCH: 29.3 pg (ref 26.0–34.0)
MCHC: 32 g/dL (ref 30.0–36.0)
MCV: 91.6 fL (ref 78.0–100.0)
Platelets: 163 10*3/uL (ref 150–400)
RBC: 5.22 MIL/uL (ref 4.22–5.81)
RDW: 13.1 % (ref 11.5–15.5)
WBC: 7.3 10*3/uL (ref 4.0–10.5)

## 2017-12-24 LAB — I-STAT TROPONIN, ED: Troponin i, poc: 0 ng/mL (ref 0.00–0.08)

## 2017-12-24 NOTE — ED Triage Notes (Signed)
Reports swelling in knees that's coming and going.  Also reports feeling "a little sob".  Breathing easy and nonlabored in triage.

## 2017-12-25 MED ORDER — DICLOFENAC SODIUM 1 % TD GEL: 2.0000 g | Freq: Two times a day (BID) | TRANSDERMAL | 0 refills | Status: DC

## 2017-12-25 MED ORDER — OXYCODONE HCL 5 MG PO TABS: 5.0000 mg | ORAL_TABLET | ORAL | 0 refills | Status: DC | PRN

## 2017-12-25 NOTE — ED Provider Notes (Signed)
[facility] EMERGENCY DEPARTMENT Provider Note   CSN: [id number] Arrival date & time: 12/24/17  2206     History   Chief Complaint Chief Complaint  Patient presents with  . Leg Swelling    HPI Matthew Edwards is a 77 y.o. male.  The history is provided by the patient.  He has history of hypertension, hyperlipidemia, diabetes, coronary artery disease, atrial fibrillation, COPD and comes in complaining of bilateral knee pain and swelling over the last 3 weeks.  He does not recall any trauma, but has had problems with knee swelling in the past.  He rates pain at 8/10.  The pain is worse with movement, nothing makes it better.  He denies fever or chills.  He denies any dyspnea.  He takes warfarin for his atrial fibrillation, but does not know when his last INR was checked.  Past Medical History:  Diagnosis Date  . Arthritis   . Atrial fibrillation ([place])   . CAD (coronary artery disease) 2012   [place]-LAD 2 or 3 srents  . Complication of anesthesia    "loopy after polpy removal dec 2017, lasted several days"  . COPD (chronic obstructive pulmonary disease) ([place])   . Dementia   . GERD (gastroesophageal reflux disease)   . HEART FAILURE, CONGESTIVE UNSPEC 02/23/2007   Qualifier: Diagnosis of  By: Mellody Drown MD, [facility]    . Hyperlipidemia   . Hypertension   . OSA (obstructive sleep apnea) 11/13/2014  . S/P AVR    #25 mm Edwards pericardial valve Bioprosthetic. Dr Cyndia Bent.  . Situational depression    "son passed 11/2013"  . Sleep apnea    occ uses c pap does not know settings   . Stented coronary artery 2013  . TOBACCO DEPENDENCE 07/29/2006   Qualifier: History of  By: Carlena Sax  MD, [place]    . Type II diabetes mellitus [facility])     Patient Active Problem List   Diagnosis Date Noted  . Counseling and coordination of care 11/20/2017  . Cellulitis of abdominal wall 11/11/2017  . Lateral epicondylitis of right elbow 09/27/2016  . Acute pain of both knees 09/08/2016  .  Chronic bilateral low back pain without sciatica 09/08/2016  . Adenomatous rectal polyp with high grade dysplasia s/p TEM resection 05/26/2016 05/26/2016  . Spinal stenosis of lumbar region 11/28/2015  . Abnormality of gait   . Type 2 diabetes mellitus with complication, without long-term current use of insulin ([place])   . Constipation 01/28/2015  . OSA (obstructive sleep apnea) 11/13/2014  . Pulmonary HTN ([place]) 08/24/2014  . Chronic anticoagulation   . Chronic atrial fibrillation ([place])   . Acute on chronic congestive heart failure with left ventricular diastolic dysfunction ([place]) 08/02/2014  . Cognitive impairment 07/02/2014  . Abdominal distention 06/28/2014  . S/P AVR 12/29/2012  . Diabetic neuropathy ([place]) 11/25/2011  . Excessive tearing 10/06/2011  . BENIGN POSITIONAL VERTIGO 06/14/2010  . Dizziness 06/14/2010  . Vitamin D deficiency 02/21/2010  . Primary hyperparathyroidism ([place]) 06/04/2009  . Hypercalcemia 05/29/2009  . Essential hypertension 02/23/2007  . DEPRESSION 12/15/2006  . Coronary atherosclerosis 12/15/2006  . GERD 12/15/2006  . COPD (chronic obstructive pulmonary disease) ([place]) 12/10/2006  . HLD (hyperlipidemia) 07/29/2006  . Alcohol abuse 07/29/2006  . Aortic valve disorder 07/29/2006    Past Surgical History:  Procedure Laterality Date  . AORTIC VALVE REPLACEMENT  07/2010   notes 07/28/2010  . CARDIAC CATHETERIZATION  06/2010   Archie Endo 07/01/2010  . COLONOSCOPY WITH PROPOFOL N/A 02/08/2017  Procedure: COLONOSCOPY WITH PROPOFOL;  Surgeon: Doran Stabler, MD;  Location: [facility] ENDOSCOPY;  Service: Gastroenterology;  Laterality: N/A;  . CORONARY ANGIOPLASTY WITH STENT PLACEMENT  2006; 10/2006   "2"; 1/notes 10/01/2010  . CORONARY ARTERY BYPASS GRAFT  07/2010   "1"/notes 07/28/2010  . PARTIAL PROCTECTOMY BY TEM N/A 05/26/2016   Procedure: TEM PARTIAL PROCTECTOMY OF RECTAL MASS;  Surgeon: [name], MD;  Location: [facility] ORS;  Service: General;  Laterality: N/A;         Home Medications    Prior to Admission medications   Medication Sig Start Date End Date Taking? Authorizing Provider  acetaminophen (TYLENOL) 325 MG tablet Take 650 mg by mouth every 6 (six) hours as needed for mild pain or moderate pain.    [provider]  atorvastatin (LIPITOR) 40 MG tablet TAKE 1 TABLET DAILY AT 6 PM Patient taking differently: Take 40 mg by mouth daily at 6 PM. TAKE 1 TABLET DAILY AT 6 PM 02/05/17   Donnamae Jude, MD  benazepril (LOTENSIN) 20 MG tablet Take 1 tablet (20 mg total) by mouth daily. 02/05/17   Donnamae Jude, MD  budesonide-formoterol [facility]) 80-4.5 MCG/ACT inhaler Inhale 2 puffs into the lungs 2 (two) times daily. 02/05/17   Donnamae Jude, MD  diltiazem (CARTIA XT) 120 MG 24 hr capsule Take 1 capsule (120 mg total) by mouth daily. Patient taking differently: Take 120 mg by mouth every evening.  02/05/17   Donnamae Jude, MD  furosemide (LASIX) 40 MG tablet Take 1 tablet (40 mg total) by mouth 2 (two) times daily. 05/17/17   Alveda Reasons, MD  glipiZIDE (GLUCOTROL) 10 MG tablet TAKE 1 TABLET BY MOUTH AT 5:00 PM WITH A MEAL. Patient taking differently: Take 10 mg by mouth every evening. TAKE 1 TABLET BY MOUTH AT 5:00 PM WITH A MEAL. 02/05/17   Donnamae Jude, MD  loperamide (IMODIUM) 2 MG capsule Take 4 mg by mouth daily.    [provider]  metoprolol succinate (TOPROL-XL) 25 MG 24 hr tablet Take 1 tablet (25 mg total) by mouth daily. 02/05/17   Donnamae Jude, MD  Multiple Vitamins-Minerals (MULTIVITAMIN WITH MINERALS) tablet Take 1 tablet by mouth every evening.    [provider]  nitroGLYCERIN (NITROSTAT) 0.4 MG SL tablet Place 1 tablet (0.4 mg total) under the tongue every 5 (five) minutes as needed. For chest pain 03/04/17   [place], Peter M, MD  potassium chloride SA (KLOR-CON M20) 20 MEQ tablet Take 1 tablet (20 mEq total) by mouth daily. Patient taking differently: Take 20 mEq by mouth at bedtime.  02/05/17   Donnamae Jude, MD  PRESCRIPTION MEDICATION Pt has CPAP machine    [provider]  psyllium (METAMUCIL) 58.6 % powder Take 1 packet by mouth daily.    [provider]  vitamin C (ASCORBIC ACID) 500 MG tablet Take 1 tablet (500 mg total) by mouth every morning. 02/05/17   Donnamae Jude, MD  Vitamin D, Ergocalciferol, (DRISDOL) 50000 units CAPS capsule Take 1 capsule (50,000 Units total) by mouth every 7 (seven) days. Wednesdays 02/05/17   Donnamae Jude, MD  warfarin (COUMADIN) 5 MG tablet Take 1 tablet (5 mg total) by mouth daily. Take as directed Patient taking differently: Take 7.5-10 mg by mouth daily. Taking 7.5 mg on Monday, Tuesday. Thursday, Saturday and Sunday Takes 10 mg on Friday and Wednesday 02/05/17   Donnamae Jude, MD    Family History  Family History  Problem Relation Age of Onset  . Heart disease Mother   . Colon cancer Neg Hx   . Esophageal cancer Neg Hx   . Stomach cancer Neg Hx   . Pancreatic cancer Neg Hx   . Liver disease Neg Hx   . Colon polyps Neg Hx     Social History Social History   Tobacco Use  . Smoking status: Former Smoker    Packs/day: 0.50    Years: 46.00    Pack years: 23.00    Types: Cigarettes  . Smokeless tobacco: Never Used  Substance Use Topics  . Alcohol use: No  . Drug use: No     Allergies   Pineapple concentrate   Review of Systems Review of Systems  All other systems reviewed and are negative.    Physical Exam Updated Vital Signs BP 139/84   Pulse (!) 112   Temp 98.4 F (36.9 C) (Oral)   Resp 16   Ht 5\' 9"  (1.753 m)   Wt 109.8 kg (242 lb)   SpO2 96%   BMI 35.74 kg/m   Physical Exam  Nursing note and vitals reviewed.  77 year old male, resting comfortably and in no acute distress. Vital signs are significant for elevated heart rate. Oxygen saturation is 96%, which is normal. Head is normocephalic and atraumatic. PERRLA, EOMI. Oropharynx is clear. Neck is nontender and supple without adenopathy or  JVD. Back is nontender and there is no CVA tenderness. Lungs are clear without rales, wheezes, or rhonchi. Chest is nontender. Heart has an irregular rhythm without murmur. Abdomen is soft, flat, nontender without masses or hepatosplenomegaly and peristalsis is normoactive. Extremities: Trace pretibial edema with moderate bilateral venous stasis changes.  Small to moderate knee effusion present bilaterally.  No tenderness of the knee joint.  No instability on valgus or varus stress.  Lachman and McMurray's tests are negative, but pain is elicited with passive range of motion of both knees. Skin is warm and dry without rash. Neurologic: Mental status is normal, cranial nerves are intact, there are no motor or sensory deficits.  ED Treatments / Results  Labs (all labs ordered are listed, but only abnormal results are displayed) Labs Reviewed  BASIC METABOLIC PANEL - Abnormal; Notable for the following components:      Result Value   Chloride 97 (*)    Glucose, Bld 102 (*)    All other components within normal limits  CBC  I-STAT TROPONIN, ED    EKG EKG Interpretation  Date/Time:  Friday December 24 2017 22:15:25 EDT Ventricular Rate:  94 PR Interval:    QRS Duration: 80 QT Interval:  346 QTC Calculation: 432 R Axis:   61 Text Interpretation:  Atrial fibrillation with premature ventricular or aberrantly conducted complexes Nonspecific ST and T wave abnormality Abnormal ECG When compared with ECG of 03/12/2017, Premature ventricular complexes are now present Confirmed by Delora Fuel ([id number]) on 12/24/2017 11:06:52 PM   Radiology Dg Chest 2 View  Result Date: 12/24/2017 CLINICAL DATA:  Shortness of breath EXAM: CHEST - 2 VIEW COMPARISON:  05/26/2016 FINDINGS: Postoperative changes in the mediastinum. Shallow inspiration. Borderline heart size. Lungs are clear. No blunting of costophrenic angles. No pneumothorax. Calcification of the aorta. IMPRESSION: No active disease.  Aortic  atherosclerosis. Electronically Signed   By: Lucienne Capers M.D.   On: 12/24/2017 23:11    Procedures Procedures   Medications Ordered in ED Medications - No data to display   Initial Impression /  Assessment and Plan / ED Course  I have reviewed the triage vital signs and the nursing notes.  Pertinent labs & imaging results that were available during my care of the patient were reviewed by me and considered in my medical decision making (see chart for details).  Bilateral knee effusions which seem most likely to be related to arthritis.  Old records are reviewed, and he did have the x-rays done in 2018 which showed mild degenerative changes.  Without history of trauma, I do not have an indication to repeat x-rays.  Labs and chest x-ray have been ordered prior to my seeing the patient and are unremarkable.  ECG is showing ongoing atrial fibrillation.  Last INR was checked on June 13.  I have explained to patient that he should have that repeated, and done monthly.  He is discharged with prescription for diclofenac gel to apply to each knee twice a day.  Given prescription for small number of oxycodone tablets for peer pain relief.  Cannot use oral NSAIDs because of concomitant use of warfarin.  He is referred back to his primary care provider.  He might benefit from steroid injections, but I do not want to do those in the ED with him anticoagulated.  Final Clinical Impressions(s) / ED Diagnoses   Final diagnoses:  Pain in both knees, unspecified chronicity  Bilateral knee effusions  Chronic atrial fibrillation [facility])    ED Discharge Orders        Ordered    oxyCODONE (ROXICODONE) 5 MG immediate release tablet  Every 4 hours PRN     12/25/17 0404    diclofenac sodium (VOLTAREN) 1 % GEL  2 times daily,   Status:  Discontinued     12/25/17 0404    diclofenac sodium (VOLTAREN) 1 % GEL  2 times daily     76/28/31 5176       Delora Fuel, MD [birth year]/37 [phone]

## 2017-12-25 NOTE — Discharge Instructions (Addendum)
Apply ice to your knees several times a day.  Take acetaminophen as needed for less severe pain.

## 2017-12-26 DIAGNOSIS — R4020 Unspecified coma: Secondary | ICD-10-CM | POA: Diagnosis not present

## 2017-12-26 DIAGNOSIS — R5381 Other malaise: Secondary | ICD-10-CM | POA: Diagnosis not present

## 2017-12-26 DIAGNOSIS — R0902 Hypoxemia: Secondary | ICD-10-CM | POA: Diagnosis not present

## 2017-12-27 ENCOUNTER — Encounter (HOSPITAL_COMMUNITY): Payer: Self-pay

## 2017-12-27 ENCOUNTER — Emergency Department (HOSPITAL_COMMUNITY): Payer: Medicare HMO

## 2017-12-27 ENCOUNTER — Observation Stay (HOSPITAL_COMMUNITY)
Admission: EM | Admit: 2017-12-27 | Discharge: 2017-12-30 | Disposition: A | Discharge status: Skilled Nursing Facility | Payer: Medicare HMO | Attending: Internal Medicine | Admitting: Internal Medicine

## 2017-12-27 ENCOUNTER — Other Ambulatory Visit: Payer: Self-pay

## 2017-12-27 DIAGNOSIS — Z7984 Long term (current) use of oral hypoglycemic drugs: Secondary | ICD-10-CM | POA: Insufficient documentation

## 2017-12-27 DIAGNOSIS — I700 Atherosclerosis of aorta: Secondary | ICD-10-CM | POA: Diagnosis not present

## 2017-12-27 DIAGNOSIS — I482 Chronic atrial fibrillation, unspecified: Secondary | ICD-10-CM | POA: Diagnosis present

## 2017-12-27 DIAGNOSIS — Z7901 Long term (current) use of anticoagulants: Secondary | ICD-10-CM | POA: Diagnosis not present

## 2017-12-27 DIAGNOSIS — Z791 Long term (current) use of non-steroidal anti-inflammatories (NSAID): Secondary | ICD-10-CM | POA: Insufficient documentation

## 2017-12-27 DIAGNOSIS — E785 Hyperlipidemia, unspecified: Secondary | ICD-10-CM | POA: Insufficient documentation

## 2017-12-27 DIAGNOSIS — Z91018 Allergy to other foods: Secondary | ICD-10-CM | POA: Insufficient documentation

## 2017-12-27 DIAGNOSIS — R159 Full incontinence of feces: Secondary | ICD-10-CM | POA: Diagnosis not present

## 2017-12-27 DIAGNOSIS — I6529 Occlusion and stenosis of unspecified carotid artery: Secondary | ICD-10-CM | POA: Insufficient documentation

## 2017-12-27 DIAGNOSIS — R509 Fever, unspecified: Principal | ICD-10-CM | POA: Diagnosis present

## 2017-12-27 DIAGNOSIS — I5032 Chronic diastolic (congestive) heart failure: Secondary | ICD-10-CM | POA: Diagnosis present

## 2017-12-27 DIAGNOSIS — I10 Essential (primary) hypertension: Secondary | ICD-10-CM

## 2017-12-27 DIAGNOSIS — K219 Gastro-esophageal reflux disease without esophagitis: Secondary | ICD-10-CM | POA: Diagnosis not present

## 2017-12-27 DIAGNOSIS — Z953 Presence of xenogenic heart valve: Secondary | ICD-10-CM | POA: Insufficient documentation

## 2017-12-27 DIAGNOSIS — Z7951 Long term (current) use of inhaled steroids: Secondary | ICD-10-CM | POA: Diagnosis not present

## 2017-12-27 DIAGNOSIS — E119 Type 2 diabetes mellitus without complications: Secondary | ICD-10-CM | POA: Diagnosis not present

## 2017-12-27 DIAGNOSIS — E118 Type 2 diabetes mellitus with unspecified complications: Secondary | ICD-10-CM | POA: Diagnosis present

## 2017-12-27 DIAGNOSIS — I959 Hypotension, unspecified: Secondary | ICD-10-CM | POA: Insufficient documentation

## 2017-12-27 DIAGNOSIS — Z79891 Long term (current) use of opiate analgesic: Secondary | ICD-10-CM | POA: Insufficient documentation

## 2017-12-27 DIAGNOSIS — G4733 Obstructive sleep apnea (adult) (pediatric): Secondary | ICD-10-CM | POA: Insufficient documentation

## 2017-12-27 DIAGNOSIS — N200 Calculus of kidney: Secondary | ICD-10-CM | POA: Insufficient documentation

## 2017-12-27 DIAGNOSIS — J449 Chronic obstructive pulmonary disease, unspecified: Secondary | ICD-10-CM | POA: Insufficient documentation

## 2017-12-27 DIAGNOSIS — R410 Disorientation, unspecified: Secondary | ICD-10-CM | POA: Diagnosis present

## 2017-12-27 DIAGNOSIS — N281 Cyst of kidney, acquired: Secondary | ICD-10-CM | POA: Insufficient documentation

## 2017-12-27 DIAGNOSIS — F0390 Unspecified dementia without behavioral disturbance: Secondary | ICD-10-CM | POA: Diagnosis not present

## 2017-12-27 DIAGNOSIS — R4182 Altered mental status, unspecified: Secondary | ICD-10-CM | POA: Diagnosis not present

## 2017-12-27 DIAGNOSIS — R001 Bradycardia, unspecified: Secondary | ICD-10-CM | POA: Diagnosis not present

## 2017-12-27 DIAGNOSIS — G9389 Other specified disorders of brain: Secondary | ICD-10-CM | POA: Insufficient documentation

## 2017-12-27 DIAGNOSIS — Z9889 Other specified postprocedural states: Secondary | ICD-10-CM | POA: Insufficient documentation

## 2017-12-27 DIAGNOSIS — I110 Hypertensive heart disease with heart failure: Secondary | ICD-10-CM | POA: Diagnosis not present

## 2017-12-27 DIAGNOSIS — Z87891 Personal history of nicotine dependence: Secondary | ICD-10-CM | POA: Insufficient documentation

## 2017-12-27 DIAGNOSIS — I2510 Atherosclerotic heart disease of native coronary artery without angina pectoris: Secondary | ICD-10-CM | POA: Insufficient documentation

## 2017-12-27 DIAGNOSIS — E538 Deficiency of other specified B group vitamins: Secondary | ICD-10-CM | POA: Diagnosis not present

## 2017-12-27 DIAGNOSIS — Z955 Presence of coronary angioplasty implant and graft: Secondary | ICD-10-CM | POA: Insufficient documentation

## 2017-12-27 DIAGNOSIS — Z79899 Other long term (current) drug therapy: Secondary | ICD-10-CM | POA: Insufficient documentation

## 2017-12-27 DIAGNOSIS — R531 Weakness: Secondary | ICD-10-CM | POA: Insufficient documentation

## 2017-12-27 DIAGNOSIS — J9811 Atelectasis: Secondary | ICD-10-CM | POA: Diagnosis not present

## 2017-12-27 DIAGNOSIS — Z951 Presence of aortocoronary bypass graft: Secondary | ICD-10-CM | POA: Insufficient documentation

## 2017-12-27 LAB — COMPREHENSIVE METABOLIC PANEL
ALT: 12 U/L (ref 0–44)
AST: 21 U/L (ref 15–41)
Albumin: 4 g/dL (ref 3.5–5.0)
Alkaline Phosphatase: 67 U/L (ref 38–126)
Anion gap: 11 (ref 5–15)
BUN: 17 mg/dL (ref 8–23)
CO2: 32 mmol/L (ref 22–32)
Calcium: 10.3 mg/dL (ref 8.9–10.3)
Chloride: 96 mmol/L — ABNORMAL LOW (ref 98–111)
Creatinine, Ser: 1.16 mg/dL (ref 0.61–1.24)
GFR calc Af Amer: >60 mL/min (ref >60)
GFR calc non Af Amer: >60 mL/min (ref >60)
Glucose, Bld: 117 mg/dL — ABNORMAL HIGH (ref 70–99)
Potassium: 4 mmol/L (ref 3.5–5.1)
Sodium: 139 mmol/L (ref 135–145)
Total Bilirubin: 1.3 mg/dL — ABNORMAL HIGH (ref 0.3–1.2)
Total Protein: 8 g/dL (ref 6.5–8.1)

## 2017-12-27 LAB — GLUCOSE, CAPILLARY
Glucose-Capillary: 116 mg/dL — ABNORMAL HIGH (ref 70–99)
Glucose-Capillary: 137 mg/dL — ABNORMAL HIGH (ref 70–99)
Glucose-Capillary: 139 mg/dL — ABNORMAL HIGH (ref 70–99)
Glucose-Capillary: 147 mg/dL — ABNORMAL HIGH (ref 70–99)
Glucose-Capillary: 169 mg/dL — ABNORMAL HIGH (ref 70–99)

## 2017-12-27 LAB — PROTIME-INR
INR: 3.87
Prothrombin Time: 37.7 seconds — ABNORMAL HIGH (ref 11.4–15.2)

## 2017-12-27 LAB — URINALYSIS, ROUTINE W REFLEX MICROSCOPIC
Bilirubin Urine: NEGATIVE
Glucose, UA: NEGATIVE mg/dL
Ketones, ur: 5 mg/dL — AB
Leukocytes, UA: NEGATIVE
Nitrite: NEGATIVE
Protein, ur: NEGATIVE mg/dL
Specific Gravity, Urine: 1.012 (ref 1.005–1.030)
pH: 5 (ref 5.0–8.0)

## 2017-12-27 LAB — CBC WITH DIFFERENTIAL/PLATELET
Basophils Absolute: 0 10*3/uL (ref 0.0–0.1)
Basophils Relative: 0 %
Eosinophils Absolute: 0 10*3/uL (ref 0.0–0.7)
Eosinophils Relative: 0 %
HCT: 43.6 % (ref 39.0–52.0)
Hemoglobin: 15.2 g/dL (ref 13.0–17.0)
Lymphocytes Relative: 11 %
Lymphs Abs: 1.1 10*3/uL (ref 0.7–4.0)
MCH: 31.5 pg (ref 26.0–34.0)
MCHC: 34.9 g/dL (ref 30.0–36.0)
MCV: 90.5 fL (ref 78.0–100.0)
Monocytes Absolute: 0.6 10*3/uL (ref 0.1–1.0)
Monocytes Relative: 6 %
Neutro Abs: 8.1 10*3/uL — ABNORMAL HIGH (ref 1.7–7.7)
Neutrophils Relative %: 83 %
Platelets: 195 10*3/uL (ref 150–400)
RBC: 4.82 MIL/uL (ref 4.22–5.81)
RDW: 13.2 % (ref 11.5–15.5)
WBC: 9.8 10*3/uL (ref 4.0–10.5)

## 2017-12-27 LAB — CBG MONITORING, ED: Glucose-Capillary: 116 mg/dL — ABNORMAL HIGH (ref 70–99)

## 2017-12-27 LAB — CK: Total CK: 202 U/L (ref 49–397)

## 2017-12-27 LAB — I-STAT CG4 LACTIC ACID, ED
Lactic Acid, Venous: 1.77 mmol/L (ref 0.5–1.9)
Lactic Acid, Venous: 1.03 mmol/L (ref 0.5–1.9)

## 2017-12-27 MED ORDER — ATORVASTATIN CALCIUM 40 MG PO TABS
40.0000 mg | ORAL_TABLET | Freq: Every day | ORAL | Status: DC
  Administered 2017-12-27 – 2017-12-29 (×3): 40 mg via ORAL
  Filled 2017-12-27 (×3): qty 1

## 2017-12-27 MED ORDER — BENAZEPRIL HCL 20 MG PO TABS
20.0000 mg | ORAL_TABLET | Freq: Every day | ORAL | Status: DC
  Administered 2017-12-27 – 2017-12-30 (×4): 20 mg via ORAL
  Filled 2017-12-27 (×4): qty 1

## 2017-12-27 MED ORDER — ENOXAPARIN SODIUM 40 MG/0.4ML ~~LOC~~ SOLN: 40.0000 mg | SUBCUTANEOUS | Status: DC

## 2017-12-27 MED ORDER — METOPROLOL SUCCINATE ER 25 MG PO TB24
25.0000 mg | ORAL_TABLET | Freq: Every day | ORAL | Status: DC
  Administered 2017-12-27 – 2017-12-29 (×3): 25 mg via ORAL
  Filled 2017-12-27 (×3): qty 1

## 2017-12-27 MED ORDER — FUROSEMIDE 10 MG/ML IJ SOLN
20.0000 mg | Freq: Once | INTRAMUSCULAR | Status: AC
  Administered 2017-12-27: 20 mg via INTRAVENOUS
  Filled 2017-12-27: qty 4

## 2017-12-27 MED ORDER — OXYCODONE HCL 5 MG PO TABS: 5.0000 mg | ORAL_TABLET | ORAL | Status: DC | PRN

## 2017-12-27 MED ORDER — DICLOFENAC SODIUM 1 % TD GEL
2.0000 g | Freq: Two times a day (BID) | TRANSDERMAL | Status: DC
  Administered 2017-12-27 – 2017-12-30 (×6): 2 g via TOPICAL
  Filled 2017-12-27: qty 100

## 2017-12-27 MED ORDER — ONDANSETRON HCL 4 MG/2ML IJ SOLN: 4.0000 mg | Freq: Four times a day (QID) | INTRAMUSCULAR | Status: DC | PRN

## 2017-12-27 MED ORDER — ACETAMINOPHEN 325 MG PO TABS
650.0000 mg | ORAL_TABLET | Freq: Four times a day (QID) | ORAL | Status: DC | PRN
  Administered 2017-12-28 – 2017-12-29 (×4): 650 mg via ORAL
  Filled 2017-12-27 (×4): qty 2

## 2017-12-27 MED ORDER — ADULT MULTIVITAMIN W/MINERALS CH
1.0000 | ORAL_TABLET | Freq: Every evening | ORAL | Status: DC
  Administered 2017-12-27 – 2017-12-29 (×3): 1 via ORAL
  Filled 2017-12-27 (×4): qty 1

## 2017-12-27 MED ORDER — WARFARIN - PHARMACIST DOSING INPATIENT: Freq: Every day | Status: DC

## 2017-12-27 MED ORDER — INSULIN ASPART 100 UNIT/ML ~~LOC~~ SOLN
0.0000 [IU] | SUBCUTANEOUS | Status: DC
  Administered 2017-12-27: 1 [IU] via SUBCUTANEOUS
  Administered 2017-12-27: 2 [IU] via SUBCUTANEOUS
  Administered 2017-12-27: 1 [IU] via SUBCUTANEOUS
  Administered 2017-12-28: 2 [IU] via SUBCUTANEOUS
  Administered 2017-12-28: 1 [IU] via SUBCUTANEOUS
  Administered 2017-12-28: 2 [IU] via SUBCUTANEOUS
  Administered 2017-12-28: 1 [IU] via SUBCUTANEOUS

## 2017-12-27 MED ORDER — ONDANSETRON HCL 4 MG PO TABS: 4.0000 mg | ORAL_TABLET | Freq: Four times a day (QID) | ORAL | Status: DC | PRN

## 2017-12-27 MED ORDER — IOPAMIDOL (ISOVUE-300) INJECTION 61%
INTRAVENOUS | Status: AC
  Filled 2017-12-27: qty 100

## 2017-12-27 MED ORDER — DILTIAZEM HCL ER COATED BEADS 120 MG PO CP24
120.0000 mg | ORAL_CAPSULE | Freq: Every evening | ORAL | Status: DC
  Administered 2017-12-27 – 2017-12-29 (×3): 120 mg via ORAL
  Filled 2017-12-27 (×3): qty 1

## 2017-12-27 MED ORDER — ACETAMINOPHEN 325 MG PO TABS: 650.0000 mg | ORAL_TABLET | Freq: Four times a day (QID) | ORAL | Status: DC | PRN

## 2017-12-27 MED ORDER — ACETAMINOPHEN 650 MG RE SUPP: 650.0000 mg | Freq: Four times a day (QID) | RECTAL | Status: DC | PRN

## 2017-12-27 MED ORDER — MOMETASONE FURO-FORMOTEROL FUM 100-5 MCG/ACT IN AERO
2.0000 | INHALATION_SPRAY | Freq: Two times a day (BID) | RESPIRATORY_TRACT | Status: DC
  Administered 2017-12-27 – 2017-12-30 (×7): 2 via RESPIRATORY_TRACT
  Filled 2017-12-27: qty 8.8

## 2017-12-27 MED ORDER — IOPAMIDOL (ISOVUE-300) INJECTION 61%
100.0000 mL | Freq: Once | INTRAVENOUS | Status: AC | PRN
  Administered 2017-12-27: 100 mL via INTRAVENOUS

## 2017-12-27 NOTE — ED Notes (Signed)
Patient transported to CT 

## 2017-12-27 NOTE — ED Triage Notes (Addendum)
Per Ptar: pt coming from home c/o altered mental status. Was seen at [facility] on 8/26 for bilateral knee pain and discharged. Family reports increased AMS since discharge and found pt on floor of home tonight. Family unsure if he hit his head, but pt on blood thinner. A&Ox2.

## 2017-12-27 NOTE — ED Notes (Signed)
Pt back from radiology 

## 2017-12-27 NOTE — ED Provider Notes (Signed)
[facility] DEPT Provider Note: Georgena Spurling, MD, FACEP  CSN: [id number] MRN: [id number] ARRIVAL: 12/27/17 at [place]: WOTF/NONE   CHIEF COMPLAINT  Altered Mental Status  Level 5 caveat: Altered mental status; dementia HISTORY OF PRESENT ILLNESS  12/27/17 1:14 AM Matthew Edwards is a 77 y.o. male with history of dementia who is brought in by EMS after his family complained of an altered mental status that has progressed over the last several days.  He was reportedly found on the floor as his home prior to arrival.  It is unsure if he hit his head but he is on warfarin.  The patient is now awake and alert and denies complaint apart from pain in his lower back with movement.  He is having difficulty lifting his left leg due to back pain.     Past Medical History:  Diagnosis Date  . Arthritis   . Atrial fibrillation ([place])   . CAD (coronary artery disease) 2012   [place]-LAD 2 or 3 srents  . Complication of anesthesia    "loopy after polpy removal dec 2017, lasted several days"  . COPD (chronic obstructive pulmonary disease) ([place])   . Dementia   . GERD (gastroesophageal reflux disease)   . HEART FAILURE, CONGESTIVE UNSPEC 02/23/2007   Qualifier: Diagnosis of  By: Mellody Drown MD, [facility]    . Hyperlipidemia   . Hypertension   . OSA (obstructive sleep apnea) 11/13/2014  . S/P AVR    #25 mm Edwards pericardial valve Bioprosthetic. Dr Cyndia Bent.  . Situational depression    "son passed 11/2013"  . Sleep apnea    occ uses c pap does not know settings   . Stented coronary artery 2013  . TOBACCO DEPENDENCE 07/29/2006   Qualifier: History of  By: Carlena Sax  MD, [facility] [place]    . Type II diabetes mellitus ([place])     Past Surgical History:  Procedure Laterality Date  . AORTIC VALVE REPLACEMENT  07/2010   notes 07/28/2010  . CARDIAC CATHETERIZATION  06/2010   Archie Endo 07/01/2010  . COLONOSCOPY WITH PROPOFOL N/A 02/08/2017   Procedure: COLONOSCOPY WITH PROPOFOL;  Surgeon: Doran Stabler, MD;   Location: [facility];  Service: Gastroenterology;  Laterality: N/A;  . CORONARY ANGIOPLASTY WITH STENT PLACEMENT  2006; 10/2006   "2"; 1/notes 10/01/2010  . CORONARY ARTERY BYPASS GRAFT  07/2010   "1"/notes 07/28/2010  . PARTIAL PROCTECTOMY BY TEM N/A 05/26/2016   Procedure: TEM PARTIAL PROCTECTOMY OF RECTAL MASS;  Surgeon: [name], MD;  Location: [facility] OR[facility];  Service: General;  Laterality: N/A;    Family History  Problem Relation Age of Onset  . Heart disease Mother   . Colon cancer Neg Hx   . Esophageal cancer Neg Hx   . Stomach cancer Neg Hx   . Pancreatic cancer Neg Hx   . Liver disease Neg Hx   . Colon polyps Neg Hx     Social History   Tobacco Use  . Smoking status: Former Smoker    Packs/day: 0.50    Years: 46.00    Pack years: 23.00    Types: Cigarettes  . Smokeless tobacco: Never Used  Substance Use Topics  . Alcohol use: No  . Drug use: No    Prior to Admission medications   Medication Sig Start Date End Date Taking? Authorizing Provider  acetaminophen (TYLENOL) 325 MG tablet Take 650 mg by mouth every 6 (six) hours as needed for mild pain or moderate pain.   Yes [provider]  atorvastatin (LIPITOR) 40 MG tablet TAKE 1 TABLET DAILY AT 6 PM Patient taking differently: Take 40 mg by mouth daily at 6 PM. TAKE 1 TABLET DAILY AT 6 PM 02/05/17  Yes Donnamae Jude, MD  benazepril (LOTENSIN) 20 MG tablet Take 1 tablet (20 mg total) by mouth daily. 02/05/17  Yes Donnamae Jude, MD  budesonide-formoterol [facility]) 80-4.5 MCG/ACT inhaler Inhale 2 puffs into the lungs 2 (two) times daily. 02/05/17  Yes Donnamae Jude, MD  diclofenac sodium (VOLTAREN) 1 % GEL Apply 2 g topically 2 (two) times daily. 6/96/29  Yes Delora Fuel, MD  diltiazem (CARTIA XT) 120 MG 24 hr capsule Take 1 capsule (120 mg total) by mouth daily. Patient taking differently: Take 120 mg by mouth every evening.  02/05/17  Yes Donnamae Jude, MD  furosemide (LASIX) 40 MG tablet Take 1 tablet (40 mg  total) by mouth 2 (two) times daily. 05/17/17  Yes Alveda Reasons, MD  glipiZIDE (GLUCOTROL) 10 MG tablet TAKE 1 TABLET BY MOUTH AT 5:00 PM WITH A MEAL. Patient taking differently: Take 10 mg by mouth every evening. TAKE 1 TABLET BY MOUTH AT 5:00 PM WITH A MEAL. 02/05/17  Yes Donnamae Jude, MD  loperamide (IMODIUM) 2 MG capsule Take 4 mg by mouth daily.   Yes [provider]  metoprolol succinate (TOPROL-XL) 25 MG 24 hr tablet Take 1 tablet (25 mg total) by mouth daily. 02/05/17  Yes Donnamae Jude, MD  Multiple Vitamins-Minerals (MULTIVITAMIN WITH MINERALS) tablet Take 1 tablet by mouth every evening.   Yes [provider]  nitroGLYCERIN (NITROSTAT) 0.4 MG SL tablet Place 1 tablet (0.4 mg total) under the tongue every 5 (five) minutes as needed. For chest pain 03/04/17  Yes [place], Peter M, MD  oxyCODONE (ROXICODONE) 5 MG immediate release tablet Take 1 tablet (5 mg total) by mouth every 4 (four) hours as needed for severe pain. 10/27/39  Yes Delora Fuel, MD  potassium chloride SA (KLOR-CON M20) 20 MEQ tablet Take 1 tablet (20 mEq total) by mouth daily. Patient taking differently: Take 20 mEq by mouth at bedtime.  02/05/17  Yes Donnamae Jude, MD  vitamin C (ASCORBIC ACID) 500 MG tablet Take 1 tablet (500 mg total) by mouth every morning. 02/05/17  Yes Donnamae Jude, MD  warfarin (COUMADIN) 5 MG tablet Take 1 tablet (5 mg total) by mouth daily. Take as directed Patient taking differently: Take 7.5-10 mg by mouth daily. Pt takes 7.5 mg on Tuesday and Friday. Pt takes 5mg  on Sun,Mon,Wed,Sat. 02/05/17  Yes Donnamae Jude, MD  PRESCRIPTION MEDICATION Pt has CPAP machine    [provider]    Allergies Pineapple concentrate   REVIEW OF SYSTEMS     PHYSICAL EXAMINATION  Initial Vital Signs Blood pressure 131/77, pulse (!) 57, temperature 99.4 F (37.4 C), temperature source Oral, resp. rate 18, height 5\' 9"  (1.753 m), weight 109.8 kg (242 lb), SpO2 92  %.  Examination General: Well-developed, well-nourished male in no acute distress; appearance consistent with age of record HENT: normocephalic; atraumatic Eyes: pupils equal, round and reactive to light; extraocular muscles intact; arcus senilis bilaterally Neck: supple Heart: Irregular rhythm; frequent PVCs Lungs: clear to auscultation bilaterally Abdomen: soft; nondistended; nontender; bowel sounds present Extremities: No deformity; full range of motion; pulses normal; trace edema of lower legs Neurologic: Awake, alert and oriented x 2; motor function intact in all extremities and symmetric except limited exam in left lower  extremity due to pain on attempted movement; no facial droop Skin: Warm and dry Psychiatric: Normal mood and affect   RESULTS  Summary of this visit's results, reviewed by myself:   EKG Interpretation  Date/Time:    Ventricular Rate:    PR Interval:    QRS Duration:   QT Interval:    QTC Calculation:   R Axis:     Text Interpretation:        Laboratory Studies: Results for orders placed or performed during the hospital encounter of 12/27/17 (from the past 24 hour(s))  Comprehensive metabolic panel     Status: Abnormal   Collection Time: 12/27/17  1:12 AM  Result Value Ref Range   Sodium 139 135 - 145 mmol/L   Potassium 4.0 3.5 - 5.1 mmol/L   Chloride 96 (L) 98 - 111 mmol/L   CO2 32 22 - 32 mmol/L   Glucose, Bld 117 (H) 70 - 99 mg/dL   BUN 17 8 - 23 mg/dL   Creatinine, Ser 1.16 0.61 - 1.24 mg/dL   Calcium 10.3 8.9 - 10.3 mg/dL   Total Protein 8.0 6.5 - 8.1 g/dL   Albumin 4.0 3.5 - 5.0 g/dL   AST 21 15 - 41 U/L   ALT 12 0 - 44 U/L   Alkaline Phosphatase 67 38 - 126 U/L   Total Bilirubin 1.3 (H) 0.3 - 1.2 mg/dL   GFR calc non Af Amer >60 >60 mL/min   GFR calc Af Amer >60 >60 mL/min   Anion gap 11 5 - 15  Protime-INR - (order if patient is taking Coumadin / Warfarin)     Status: Abnormal   Collection Time: 12/27/17  1:12 AM  Result Value Ref  Range   Prothrombin Time 37.7 (H) 11.4 - 15.2 seconds   INR 3.87   Urinalysis, Routine w reflex microscopic     Status: Abnormal   Collection Time: 12/27/17  1:13 AM  Result Value Ref Range   Color, Urine YELLOW YELLOW   APPearance CLEAR CLEAR   Specific Gravity, Urine 1.012 1.005 - 1.030   pH 5.0 5.0 - 8.0   Glucose, UA NEGATIVE NEGATIVE mg/dL   Hgb urine dipstick MODERATE (A) NEGATIVE   Bilirubin Urine NEGATIVE NEGATIVE   Ketones, ur 5 (A) NEGATIVE mg/dL   Protein, ur NEGATIVE NEGATIVE mg/dL   Nitrite NEGATIVE NEGATIVE   Leukocytes, UA NEGATIVE NEGATIVE   RBC / HPF 21-50 0 - 5 RBC/hpf   WBC, UA 0-5 0 - 5 WBC/hpf   Bacteria, UA RARE (A) NONE SEEN   Hyaline Casts, UA PRESENT    Ca Oxalate Crys, UA PRESENT   CBG monitoring, ED     Status: Abnormal   Collection Time: 12/27/17  1:17 AM  Result Value Ref Range   Glucose-Capillary 116 (H) 70 - 99 mg/dL  CBC with Differential/Platelet     Status: Abnormal   Collection Time: 12/27/17  1:19 AM  Result Value Ref Range   WBC 9.8 4.0 - 10.5 K/uL   RBC 4.82 4.22 - 5.81 MIL/uL   Hemoglobin 15.2 13.0 - 17.0 g/dL   HCT 43.6 39.0 - 52.0 %   MCV 90.5 78.0 - 100.0 fL   MCH 31.5 26.0 - 34.0 pg   MCHC 34.9 30.0 - 36.0 g/dL   RDW 13.2 11.5 - 15.5 %   Platelets 195 150 - 400 K/uL   Neutrophils Relative % 83 %   Neutro Abs 8.1 (H) 1.7 - 7.7 K/uL  Lymphocytes Relative 11 %   Lymphs Abs 1.1 0.7 - 4.0 K/uL   Monocytes Relative 6 %   Monocytes Absolute 0.6 0.1 - 1.0 K/uL   Eosinophils Relative 0 %   Eosinophils Absolute 0.0 0.0 - 0.7 K/uL   Basophils Relative 0 %   Basophils Absolute 0.0 0.0 - 0.1 K/uL  I-Stat CG4 Lactic Acid, ED     Status: None   Collection Time: 12/27/17  1:25 AM  Result Value Ref Range   Lactic Acid, Venous 1.77 0.5 - 1.9 mmol/L  I-Stat CG4 Lactic Acid, ED     Status: None   Collection Time: 12/27/17  3:23 AM  Result Value Ref Range   Lactic Acid, Venous 1.03 0.5 - 1.9 mmol/L   Imaging Studies: Dg Chest 2  View  Result Date: 12/27/2017 CLINICAL DATA:  Altered mental status.  Low grade fever. EXAM: CHEST - 2 VIEW COMPARISON:  Radiographs 3 days ago 12/24/2017 FINDINGS: Post median sternotomy with prosthetic aortic valve. Unchanged cardiomegaly and mediastinal contours. Mild vascular congestion with fluid in the fissures, increased from prior exam. Mild left basilar atelectasis without confluent airspace disease. No pleural effusion or pneumothorax. No acute osseous abnormalities. IMPRESSION: 1. Mild vascular congestion and fluid in the fissures, increased from prior exam suggesting mild fluid overload. Stable cardiomegaly. 2. Left basilar atelectasis without confluent airspace disease. Electronically Signed   By: Jeb Levering M.D.   On: 12/27/2017 02:06   Ct Head Wo Contrast  Result Date: 12/27/2017 CLINICAL DATA:  Altered mental status, fever. Found down at home. On blood thinners. History of hypertension, diabetes and dementia. EXAM: CT HEAD WITHOUT CONTRAST TECHNIQUE: Contiguous axial images were obtained from the base of the skull through the vertex without intravenous contrast. COMPARISON:  MRI of the head March 12, 2017 FINDINGS: BRAIN: No intraparenchymal hemorrhage, mass effect nor midline shift. Moderate ventriculomegaly. No hydrocephalus. Patchy to confluent supratentorial white matter hypodensities. No acute large vascular territory infarcts. No abnormal extra-axial fluid collections. Basal cisterns are patent. VASCULAR: Mild calcific atherosclerosis of the carotid siphons. SKULL: No skull fracture. No significant scalp soft tissue swelling. SINUSES/ORBITS: The mastoid air-cells and included paranasal sinuses are well-aerated.The included ocular globes and orbital contents are non-suspicious. OTHER: None. IMPRESSION: 1. No acute intracranial process. 2. Moderate parenchymal brain volume loss. 3. Moderate to severe chronic small vessel ischemic changes. Electronically Signed   By: Elon Alas M.D.   On: 12/27/2017 01:44   Ct Abdomen Pelvis W Contrast  Result Date: 12/27/2017 CLINICAL DATA:  Fever. Back pain with ambulation. Question psoas abscess. EXAM: CT ABDOMEN AND PELVIS WITH CONTRAST TECHNIQUE: Multidetector CT imaging of the abdomen and pelvis was performed using the standard protocol following bolus administration of intravenous contrast. CONTRAST:  176mL ISOVUE-300 IOPAMIDOL (ISOVUE-300) INJECTION 61% COMPARISON:  None. FINDINGS: Lower chest: Mild cardiomegaly with coronary artery calcifications. Mitral annulus calcifications. Hepatobiliary: Borderline hepatic steatosis. No discrete focal lesion. Scattered hepatic granulomas. Gallbladder physiologically distended, no calcified stone. No biliary dilatation. Pancreas: Mild fatty atrophy.  No ductal dilatation or inflammation. Spleen: Normal in size without focal abnormality. Adrenals/Urinary Tract: No adrenal nodule. No hydronephrosis or perinephric edema. Homogeneous renal enhancement with symmetric excretion on delayed phase imaging. Nonobstructing punctate stone in the mid right kidney. Small cyst in the lower right kidney. Urinary bladder is physiologically distended without wall thickening. Stomach/Bowel: Stomach is within normal limits. Appendix appears normal. No evidence of bowel wall thickening, distention, or inflammatory changes. Moderate stool throughout the colon. Vascular/Lymphatic: Moderate aortic and  branch atherosclerosis. No aneurysm. No enlarged lymph nodes in the abdomen or pelvis. Reproductive: Normal sized prostate gland with central calcifications. Other: No free air, free fluid, or intra-abdominal fluid collection. Musculoskeletal: No psoas fluid collection or abscess. No evidence of paraspinal abscess. Multilevel degenerative change in the lumbar spine. No inflammatory changes or findings to suggest discitis osteomyelitis. Partial ankylosis of the sacroiliac joints may be degenerative or related to inflammatory  arthropathy. IMPRESSION: 1. No acute findings in the abdomen/pelvis. Particularly, no evidence of psoas abscess. 2. Incidental findings of nonobstructing right renal stone, borderline hepatic steatosis and Aortic Atherosclerosis (ICD10-I70.0). Coronary artery calcifications noted in the included lower chest. Electronically Signed   By: Jeb Levering M.D.   On: 12/27/2017 06:50    ED COURSE and MDM  Nursing notes and initial vitals signs, including pulse oximetry, reviewed.  Vitals:   12/27/17 0430 12/27/17 0500 12/27/17 0530 12/27/17 0646  BP: 114/77 (!) 136/91 (!) 124/94 (!) 153/102  Pulse: 62 81 (!) 59 96  Resp: (!) 26 (!) 30 (!) 27   Temp:      TempSrc:      SpO2: 95% 96% 95% 98%  Weight:      Height:       2:59 AM Family reports patient was confused earlier and too weak to stand which is why they called EMS.  Nursing staff reports a temperature as high as 101 earlier.  4:56 AM Patient able to ambulate with walker but not unassisted.  Patient's family states she is normally ambulatory on his own.  We will have him admitted for unexplained fever and weakness.  PROCEDURES    ED DIAGNOSES     ICD-10-CM   1. Fever in adult R50.9   2. Weakness R53.1        Sheketa Ende, Jenny Reichmann, MD 12/27/17 [phone]

## 2017-12-27 NOTE — Care Management Obs Status (Addendum)
[facility] NOTIFICATION   Patient Details  Name: Matthew Edwards MRN: [id number] Date of Birth: 02-22-1941   Medicare Observation Status Notification Given:  Other (see comment) Unable to sign due to ams no family in room. Leeroy Cha, RN 12/27/2017, 11:08 AM

## 2017-12-27 NOTE — H&P (Signed)
History and Physical    ARSHAWN VALDEZ ZDG:[id number] DOB: Oct 28, 1940 DOA: 12/27/2017  PCP: Donnamae Jude, MD  Patient coming from: Home  I have personally briefly reviewed patient's old medical records in [place]  Chief Complaint: AMS  HPI: Matthew Edwards is a 77 y.o. male with medical history significant of dementia, AS s/p AVR with bioprosthetic, a.fib rate controlled on coumadin, CAD, OSA doesn't tolerate CPAP, DM2, HTN, diastolic CHF.  Patient presents to the ED with family c/o AMS that has progressively worsened over the past couple of days.  He was reportedly found on floor at home PTA, unsure if he hit head.  He normally ambulates un-assisted, but has been too weak to stand.   ED Course: Tm 101.7 (Documented as 101).  Requires assistance and a walker to ambulate in ED (abnormal), also has back pain with ambulation.  Back pain with L leg raise according to EDP.  UA neg, CXR just shows mild fluid overload.  EDP gave 20mg  IV lasix x1.   Review of Systems: As above; however, patient seems very demented and not oriented to recent events.  Past Medical History:  Diagnosis Date  . Arthritis   . Atrial fibrillation ([place])   . CAD (coronary artery disease) 2012   [place]-LAD 2 or 3 srents  . Complication of anesthesia    "loopy after polpy removal dec 2017, lasted several days"  . COPD (chronic obstructive pulmonary disease) ([place])   . Dementia   . GERD (gastroesophageal reflux disease)   . HEART FAILURE, CONGESTIVE UNSPEC 02/23/2007   Qualifier: Diagnosis of  By: Mellody Drown MD, [facility]    . Hyperlipidemia   . Hypertension   . OSA (obstructive sleep apnea) 11/13/2014  . S/P AVR    #25 mm Edwards pericardial valve Bioprosthetic. Dr Cyndia Bent.  . Situational depression    "son passed 11/2013"  . Sleep apnea    occ uses c pap does not know settings   . Stented coronary artery 2013  . TOBACCO DEPENDENCE 07/29/2006   Qualifier: History of  By: Carlena Sax  MD, Colletta [place]    . Type  II diabetes mellitus ([place])     Past Surgical History:  Procedure Laterality Date  . AORTIC VALVE REPLACEMENT  07/2010   notes 07/28/2010  . CARDIAC CATHETERIZATION  06/2010   Archie Endo 07/01/2010  . COLONOSCOPY WITH PROPOFOL N/A 02/08/2017   Procedure: COLONOSCOPY WITH PROPOFOL;  Surgeon: Doran Stabler, MD;  Location: [facility] ENDOSCOPY;  Service: Gastroenterology;  Laterality: N/A;  . CORONARY ANGIOPLASTY WITH STENT PLACEMENT  2006; 10/2006   "2"; 1/notes 10/01/2010  . CORONARY ARTERY BYPASS GRAFT  07/2010   "1"/notes 07/28/2010  . PARTIAL PROCTECTOMY BY TEM N/A 05/26/2016   Procedure: TEM PARTIAL PROCTECTOMY OF RECTAL MASS;  Surgeon: [name], MD;  Location: [facility] ORS;  Service: General;  Laterality: N/A;     reports that he has quit smoking. His smoking use included cigarettes. He has a 23.00 pack-year smoking history. He has never used smokeless tobacco. He reports that he does not drink alcohol or use drugs.  Allergies  Allergen Reactions  . Pineapple Concentrate Nausea And Vomiting    Family History  Problem Relation Age of Onset  . Heart disease Mother   . Colon cancer Neg Hx   . Esophageal cancer Neg Hx   . Stomach cancer Neg Hx   . Pancreatic cancer Neg Hx   . Liver disease Neg Hx   . Colon polyps Neg Hx  Prior to Admission medications   Medication Sig Start Date End Date Taking? Authorizing Provider  acetaminophen (TYLENOL) 325 MG tablet Take 650 mg by mouth every 6 (six) hours as needed for mild pain or moderate pain.   Yes [provider]  atorvastatin (LIPITOR) 40 MG tablet TAKE 1 TABLET DAILY AT 6 PM Patient taking differently: Take 40 mg by mouth daily at 6 PM. TAKE 1 TABLET DAILY AT 6 PM 02/05/17  Yes Donnamae Jude, MD  benazepril (LOTENSIN) 20 MG tablet Take 1 tablet (20 mg total) by mouth daily. 02/05/17  Yes Donnamae Jude, MD  budesonide-formoterol [facility]) 80-4.5 MCG/ACT inhaler Inhale 2 puffs into the lungs 2 (two) times daily. 02/05/17  Yes Donnamae Jude, MD  diclofenac sodium (VOLTAREN) 1 % GEL Apply 2 g topically 2 (two) times daily. 7/84/69  Yes Delora Fuel, MD  diltiazem (CARTIA XT) 120 MG 24 hr capsule Take 1 capsule (120 mg total) by mouth daily. Patient taking differently: Take 120 mg by mouth every evening.  02/05/17  Yes Donnamae Jude, MD  furosemide (LASIX) 40 MG tablet Take 1 tablet (40 mg total) by mouth 2 (two) times daily. 05/17/17  Yes Alveda Reasons, MD  glipiZIDE (GLUCOTROL) 10 MG tablet TAKE 1 TABLET BY MOUTH AT 5:00 PM WITH A MEAL. Patient taking differently: Take 10 mg by mouth every evening. TAKE 1 TABLET BY MOUTH AT 5:00 PM WITH A MEAL. 02/05/17  Yes Donnamae Jude, MD  loperamide (IMODIUM) 2 MG capsule Take 4 mg by mouth daily.   Yes [provider]  metoprolol succinate (TOPROL-XL) 25 MG 24 hr tablet Take 1 tablet (25 mg total) by mouth daily. 02/05/17  Yes Donnamae Jude, MD  Multiple Vitamins-Minerals (MULTIVITAMIN WITH MINERALS) tablet Take 1 tablet by mouth every evening.   Yes [provider]  nitroGLYCERIN (NITROSTAT) 0.4 MG SL tablet Place 1 tablet (0.4 mg total) under the tongue every 5 (five) minutes as needed. For chest pain 03/04/17  Yes Martinique, Peter M, MD  oxyCODONE (ROXICODONE) 5 MG immediate release tablet Take 1 tablet (5 mg total) by mouth every 4 (four) hours as needed for severe pain. 11/28/50  Yes Delora Fuel, MD  potassium chloride SA (KLOR-CON M20) 20 MEQ tablet Take 1 tablet (20 mEq total) by mouth daily. Patient taking differently: Take 20 mEq by mouth at bedtime.  02/05/17  Yes Donnamae Jude, MD  vitamin C (ASCORBIC ACID) 500 MG tablet Take 1 tablet (500 mg total) by mouth every morning. 02/05/17  Yes Donnamae Jude, MD  warfarin (COUMADIN) 5 MG tablet Take 1 tablet (5 mg total) by mouth daily. Take as directed Patient taking differently: Take 7.5-10 mg by mouth daily. Pt takes 7.5 mg on Tuesday and Friday. Pt takes 5mg  on Sun,Mon,Wed,Sat. 02/05/17  Yes Donnamae Jude, MD    PRESCRIPTION MEDICATION Pt has CPAP machine    [provider]    Physical Exam: Vitals:   12/27/17 0400 12/27/17 0430 12/27/17 0500 12/27/17 0530  BP: 111/63 114/77 (!) 136/91 (!) 124/94  Pulse:  62 81 (!) 59  Resp: (!) 24 (!) 26 (!) 30 (!) 27  Temp:      TempSrc:      SpO2:  95% 96% 95%  Weight:      Height:        Constitutional: NAD, calm, comfortable Eyes: PERRL, lids and conjunctivae normal ENMT: Mucous membranes are moist. Posterior pharynx clear of any exudate  or lesions.Normal dentition.  Neck: normal, supple, no masses, no thyromegaly Respiratory: clear to auscultation bilaterally, no wheezing, no crackles. Normal respiratory effort. No accessory muscle use.  Cardiovascular:Irr, irr. 1+ BLE edema 2+ pedal pulses. No carotid bruits.  Abdomen: no tenderness, No CVA tenderness, no midline back tenderness. Musculoskeletal: No knee swelling, erythema.  No pain with ROM of knees or suggestion of septic arthritis.  No other joint obviously swollen.  Back pain with L leg raise not appreciated by me. Skin: No ulcers, lesions, cellulitis or other obvious explanation for his fever. Neurologic: MAE, no saddle anesthesia, able to voluntarily move BLE. Psychiatric: Confused, demented    Labs on Admission: I have personally reviewed following labs and imaging studies  CBC: Recent Labs  Lab 12/24/17 2223 12/27/17 0119  WBC 7.3 9.8  NEUTROABS  --  8.1*  HGB 15.3 15.2  HCT 47.8 43.6  MCV 91.6 90.5  PLT 163 403   Basic Metabolic Panel: Recent Labs  Lab 12/24/17 2223 12/27/17 0112  NA 137 139  K 3.8 4.0  CL 97* 96*  CO2 27 32  GLUCOSE 102* 117*  BUN 14 17  CREATININE 1.15 1.16  CALCIUM 9.8 10.3   GFR: Estimated Creatinine Clearance: 68.2 mL/min (by C-G formula based on SCr of 1.16 mg/dL). Liver Function Tests: Recent Labs  Lab 12/27/17 0112  AST 21  ALT 12  ALKPHOS 67  BILITOT 1.3*  PROT 8.0  ALBUMIN 4.0   No results for input(s): LIPASE, AMYLASE  in the last 168 hours. No results for input(s): AMMONIA in the last 168 hours. Coagulation Profile: Recent Labs  Lab 12/27/17 0112  INR 3.87   Cardiac Enzymes: No results for input(s): CKTOTAL, CKMB, CKMBINDEX, TROPONINI in the last 168 hours. BNP (last 3 results) No results for input(s): PROBNP in the last 8760 hours. HbA1C: No results for input(s): HGBA1C in the last 72 hours. CBG: Recent Labs  Lab 12/27/17 0117  GLUCAP 116*   Lipid Profile: No results for input(s): CHOL, HDL, LDLCALC, TRIG, CHOLHDL, LDLDIRECT in the last 72 hours. Thyroid Function Tests: No results for input(s): TSH, T4TOTAL, FREET4, T3FREE, THYROIDAB in the last 72 hours. Anemia Panel: No results for input(s): VITAMINB12, FOLATE, FERRITIN, TIBC, IRON, RETICCTPCT in the last 72 hours. Urine analysis:    Component Value Date/Time   COLORURINE YELLOW 12/27/2017 0113   APPEARANCEUR CLEAR 12/27/2017 0113   LABSPEC 1.012 12/27/2017 0113   PHURINE 5.0 12/27/2017 0113   GLUCOSEU NEGATIVE 12/27/2017 0113   HGBUR MODERATE (A) 12/27/2017 0113   BILIRUBINUR NEGATIVE 12/27/2017 0113   BILIRUBINUR NEG 02/01/2015 0950   KETONESUR 5 (A) 12/27/2017 0113   PROTEINUR NEGATIVE 12/27/2017 0113   UROBILINOGEN 0.2 02/01/2015 0950   UROBILINOGEN 1.0 07/24/2010 1411   NITRITE NEGATIVE 12/27/2017 0113   LEUKOCYTESUR NEGATIVE 12/27/2017 0113    Radiological Exams on Admission: Dg Chest 2 View  Result Date: 12/27/2017 CLINICAL DATA:  Altered mental status.  Low grade fever. EXAM: CHEST - 2 VIEW COMPARISON:  Radiographs 3 days ago 12/24/2017 FINDINGS: Post median sternotomy with prosthetic aortic valve. Unchanged cardiomegaly and mediastinal contours. Mild vascular congestion with fluid in the fissures, increased from prior exam. Mild left basilar atelectasis without confluent airspace disease. No pleural effusion or pneumothorax. No acute osseous abnormalities. IMPRESSION: 1. Mild vascular congestion and fluid in the  fissures, increased from prior exam suggesting mild fluid overload. Stable cardiomegaly. 2. Left basilar atelectasis without confluent airspace disease. Electronically Signed   By: Fonnie Birkenhead.D.  On: 12/27/2017 02:06   Ct Head Wo Contrast  Result Date: 12/27/2017 CLINICAL DATA:  Altered mental status, fever. Found down at home. On blood thinners. History of hypertension, diabetes and dementia. EXAM: CT HEAD WITHOUT CONTRAST TECHNIQUE: Contiguous axial images were obtained from the base of the skull through the vertex without intravenous contrast. COMPARISON:  MRI of the head March 12, 2017 FINDINGS: BRAIN: No intraparenchymal hemorrhage, mass effect nor midline shift. Moderate ventriculomegaly. No hydrocephalus. Patchy to confluent supratentorial white matter hypodensities. No acute large vascular territory infarcts. No abnormal extra-axial fluid collections. Basal cisterns are patent. VASCULAR: Mild calcific atherosclerosis of the carotid siphons. SKULL: No skull fracture. No significant scalp soft tissue swelling. SINUSES/ORBITS: The mastoid air-cells and included paranasal sinuses are well-aerated.The included ocular globes and orbital contents are non-suspicious. OTHER: None. IMPRESSION: 1. No acute intracranial process. 2. Moderate parenchymal brain volume loss. 3. Moderate to severe chronic small vessel ischemic changes. Electronically Signed   By: Elon Alas M.D.   On: 12/27/2017 01:44    EKG: Independently reviewed.  Assessment/Plan Principal Problem:   Fever Active Problems:   Essential hypertension   Chronic atrial fibrillation (HCC)   Chronic anticoagulation   Type 2 diabetes mellitus with complication, without long-term current use of insulin (HCC)   Delirium   Chronic diastolic CHF (congestive heart failure) ([place])    1. Fever and delirium - 1. Unclear cause and source 2. Only localizing symptom seems to be difficulty with ambulation and back pain with flexion of  L hip. 3. ? Maybe a psoas abscess perhaps? 4. Will obtain CT abd/pelvis 5. NPO except meds pending this study 6. BCx pending 7. UCx ordered 2. Chronic diastolic CHF - 1. Holding lasix / potassium while NPO pending the CT study. 3. HTN - 1. Continue home BP meds 4. Chronic A.Fib - 1. Continue coumadin 2. Continue rate control 5. DM2 - 1. Sensitive SSI Q4H for the moment while NPO 2. Hold glipizide 6. Chronic fecal incontinence - (since prior surgery) 1. Holding imodium for the moment pending the CT study  DVT prophylaxis: Coumadin Code Status: Full Family Communication: Family at bedside Disposition Plan: TBD Consults called: None Admission status: Place in obs - convert to IP status if we find infectious cause of fever.   [name] DO [facility] Pager [phone] Only works nights!  If 7AM-7PM, please contact the primary day team physician taking care of patient  [email or web address] Password [email or web address]  12/27/2017, 6:00 AM

## 2017-12-27 NOTE — Progress Notes (Signed)
ANTICOAGULATION CONSULT NOTE - Initial Consult  Pharmacy Consult for Warfarin Indication: atrial fibrillation  Allergies  Allergen Reactions  . Pineapple Concentrate Nausea And Vomiting    Patient Measurements: Height: 5\' 9"  (175.3 cm) Weight: 242 lb (109.8 kg) IBW/kg (Calculated) : 70.7   Vital Signs: Temp: 101 F (38.3 C) (07/29 0111) Temp Source: Rectal (07/29 0111) BP: 124/94 (07/29 0530) Pulse Rate: 59 (07/29 0530)  Labs: Recent Labs    12/24/17 2223 12/27/17 0112 12/27/17 0119  HGB 15.3  --  15.2  HCT 47.8  --  43.6  PLT 163  --  195  LABPROT  --  37.7*  --   INR  --  3.87  --   CREATININE 1.15 1.16  --     Estimated Creatinine Clearance: 68.2 mL/min (by C-G formula based on SCr of 1.16 mg/dL).   Medical History: Past Medical History:  Diagnosis Date  . Arthritis   . Atrial fibrillation ([place])   . CAD (coronary artery disease) 2012   [place]-LAD 2 or 3 srents  . Complication of anesthesia    "loopy after polpy removal dec 2017, lasted several days"  . COPD (chronic obstructive pulmonary disease) ([place])   . Dementia   . GERD (gastroesophageal reflux disease)   . HEART FAILURE, CONGESTIVE UNSPEC 02/23/2007   Qualifier: Diagnosis of  By: Mellody Drown MD, [facility]    . Hyperlipidemia   . Hypertension   . OSA (obstructive sleep apnea) 11/13/2014  . S/P AVR    #25 mm Edwards pericardial valve Bioprosthetic. Dr Cyndia Bent.  . Situational depression    "son passed 11/2013"  . Sleep apnea    occ uses c pap does not know settings   . Stented coronary artery 2013  . TOBACCO DEPENDENCE 07/29/2006   Qualifier: History of  By: Carlena Sax  MD, Colletta [place]    . Type II diabetes mellitus (HCC)     Medications:  Scheduled:  . atorvastatin  40 mg Oral q1800  . benazepril  20 mg Oral Daily  . diclofenac sodium  2 g Topical BID  . diltiazem  120 mg Oral QPM  . insulin aspart  0-9 Units Subcutaneous Q4H  . iopamidol      . metoprolol succinate  25 mg Oral Daily  .  mometasone-formoterol  2 puff Inhalation BID  . multivitamin with minerals  1 tablet Oral QPM   Infusions:    Assessment: 37 yoM admitted with altered mental status on chronic warfarin for a-fib. HD 7.5 mg Tues/Fri, 5 mg Sun/Mon/Wed/Sat.  LD 727  INR= 3.87 on admission.   Goal of Therapy:  INR 2-3    Plan:  Daily PT/INR   Dorrene German 12/27/2017,6:10 AM

## 2017-12-27 NOTE — Progress Notes (Signed)
Patient seen and examined at bedside, patient admitted after midnight, please see earlier detailed admission note by [name], DO. Briefly, patient presented secondary to being found down for about 2-3 hours. Febrile. Patient with physical decline in setting of dementia. Concern for possible infection, currently being ruled out. Stable otherwise, so antibiotics on hold. Tmax of 101 degrees farenheit.   Cordelia Poche, MD [facility] 12/27/2017, 2:23 PM Pager: [phone]

## 2017-12-27 NOTE — ED Notes (Signed)
ED TO INPATIENT HANDOFF REPORT  Name/Age/Gender Matthew Edwards 77 y.o. male  Code Status    Code Status Orders  (From admission, onward)        Start     Ordered   12/27/17 0550  Full code  Continuous     12/27/17 0550    Code Status History    Date Active Date Inactive Code Status Order ID Comments User Context   05/26/2016 1710 05/29/2016 2005 Full Code [id number]  [name], MD Inpatient   08/02/2014 2011 08/05/2014 1820 Full Code [id number]  Melancon, [name], MD Inpatient      Home/SNF/Other Home  Chief Complaint Altered Mental Status  Level of Care/Admitting Diagnosis ED Disposition    ED Disposition Condition [facility] Area: [facility] [[id number]]  Level of Care: Med-Surg [16]  Diagnosis: Fever [882800]  Admitting Physician: [name] [[id number]]  Attending Physician: [name] [[id number]]  PT Class (Do Not Modify): Observation [104]  PT Acc Code (Do Not Modify): Observation [10022]       Medical History Past Medical History:  Diagnosis Date  . Arthritis   . Atrial fibrillation ([place])   . CAD (coronary artery disease) 2012   [place]-LAD 2 or 3 srents  . Complication of anesthesia    "loopy after polpy removal dec 2017, lasted several days"  . COPD (chronic obstructive pulmonary disease) ([place])   . Dementia   . GERD (gastroesophageal reflux disease)   . HEART FAILURE, CONGESTIVE UNSPEC 02/23/2007   Qualifier: Diagnosis of  By: Mellody Drown MD, [facility]    . Hyperlipidemia   . Hypertension   . OSA (obstructive sleep apnea) 11/13/2014  . S/P AVR    #25 mm Edwards pericardial valve Bioprosthetic. Dr Cyndia Bent.  . Situational depression    "son passed 11/2013"  . Sleep apnea    occ uses c pap does not know settings   . Stented coronary artery 2013  . TOBACCO DEPENDENCE 07/29/2006   Qualifier: History of  By: Carlena Sax  MD, [facility] [place]    . Type II diabetes mellitus (HCC)     Allergies Allergies  Allergen Reactions  . Pineapple  Concentrate Nausea And Vomiting    IV Location/Drains/Wounds Patient Lines/Drains/Airways Status   Active Line/Drains/Airways    Name:   Placement date:   Placement time:   Site:   Days:   Peripheral IV 12/27/17 Left Antecubital   12/27/17    0118    Antecubital   less than 1   Peripheral IV 12/27/17 Right Antecubital   12/27/17    0119    Antecubital   less than 1          Labs/Imaging Results for orders placed or performed during the hospital encounter of 12/27/17 (from the past 48 hour(s))  Comprehensive metabolic panel     Status: Abnormal   Collection Time: 12/27/17  1:12 AM  Result Value Ref Range   Sodium 139 135 - 145 mmol/L   Potassium 4.0 3.5 - 5.1 mmol/L   Chloride 96 (L) 98 - 111 mmol/L   CO2 32 22 - 32 mmol/L   Glucose, Bld 117 (H) 70 - 99 mg/dL   BUN 17 8 - 23 mg/dL   Creatinine, Ser 1.16 0.61 - 1.24 mg/dL   Calcium 10.3 8.9 - 10.3 mg/dL   Total Protein 8.0 6.5 - 8.1 g/dL   Albumin 4.0 3.5 - 5.0 g/dL   AST 21 15 - 41 U/L  ALT 12 0 - 44 U/L   Alkaline Phosphatase 67 38 - 126 U/L   Total Bilirubin 1.3 (H) 0.3 - 1.2 mg/dL   GFR calc non Af Amer >60 >60 mL/min   GFR calc Af Amer >60 >60 mL/min    Comment: (NOTE) The eGFR has been calculated using the CKD EPI equation. This calculation has not been validated in all clinical situations. eGFR's persistently <60 mL/min signify possible Chronic Kidney Disease.    Anion gap 11 5 - 15    Comment: Performed at [facility], [place] [address], [place], [place] [address]  Protime-INR - (order if patient is taking Coumadin / Warfarin)     Status: Abnormal   Collection Time: 12/27/17  1:12 AM  Result Value Ref Range   Prothrombin Time 37.7 (H) 11.4 - 15.2 seconds   INR 3.87     Comment: Performed at [facility], [place] [address], [place], [place] [address]  Urinalysis, Routine w reflex microscopic     Status: Abnormal   Collection Time: 12/27/17  1:13 AM  Result Value Ref Range    Color, Urine YELLOW YELLOW   APPearance CLEAR CLEAR   Specific Gravity, Urine 1.012 1.005 - 1.030   pH 5.0 5.0 - 8.0   Glucose, UA NEGATIVE NEGATIVE mg/dL   Hgb urine dipstick MODERATE (A) NEGATIVE   Bilirubin Urine NEGATIVE NEGATIVE   Ketones, ur 5 (A) NEGATIVE mg/dL   Protein, ur NEGATIVE NEGATIVE mg/dL   Nitrite NEGATIVE NEGATIVE   Leukocytes, UA NEGATIVE NEGATIVE   RBC / HPF 21-50 0 - 5 RBC/hpf   WBC, UA 0-5 0 - 5 WBC/hpf   Bacteria, UA RARE (A) NONE SEEN   Hyaline Casts, UA PRESENT    Ca Oxalate Crys, UA PRESENT     Comment: Performed at [facility], [place] [address], [place], [place] [address]  CBG monitoring, ED     Status: Abnormal   Collection Time: 12/27/17  1:17 AM  Result Value Ref Range   Glucose-Capillary 116 (H) 70 - 99 mg/dL  CBC with Differential/Platelet     Status: Abnormal   Collection Time: 12/27/17  1:19 AM  Result Value Ref Range   WBC 9.8 4.0 - 10.5 K/uL   RBC 4.82 4.22 - 5.81 MIL/uL   Hemoglobin 15.2 13.0 - 17.0 g/dL   HCT 43.6 39.0 - 52.0 %   MCV 90.5 78.0 - 100.0 fL   MCH 31.5 26.0 - 34.0 pg   MCHC 34.9 30.0 - 36.0 g/dL   RDW 13.2 11.5 - 15.5 %   Platelets 195 150 - 400 K/uL   Neutrophils Relative % 83 %   Neutro Abs 8.1 (H) 1.7 - 7.7 K/uL   Lymphocytes Relative 11 %   Lymphs Abs 1.1 0.7 - 4.0 K/uL   Monocytes Relative 6 %   Monocytes Absolute 0.6 0.1 - 1.0 K/uL   Eosinophils Relative 0 %   Eosinophils Absolute 0.0 0.0 - 0.7 K/uL   Basophils Relative 0 %   Basophils Absolute 0.0 0.0 - 0.1 K/uL    Comment: Performed at [facility], [place] [address] [place], [place] [address]  I-Stat CG4 Lactic Acid, ED     Status: None   Collection Time: 12/27/17  1:25 AM  Result Value Ref Range   Lactic Acid, Venous 1.77 0.5 - 1.9 mmol/L  I-Stat CG4 Lactic Acid, ED     Status: None   Collection Time: 12/27/17  3:23  AM  Result Value Ref Range   Lactic Acid, Venous 1.03 0.5 - 1.9 mmol/L   Dg Chest 2 View  Result Date:  12/27/2017 CLINICAL DATA:  Altered mental status.  Low grade fever. EXAM: CHEST - 2 VIEW COMPARISON:  Radiographs 3 days ago 12/24/2017 FINDINGS: Post median sternotomy with prosthetic aortic valve. Unchanged cardiomegaly and mediastinal contours. Mild vascular congestion with fluid in the fissures, increased from prior exam. Mild left basilar atelectasis without confluent airspace disease. No pleural effusion or pneumothorax. No acute osseous abnormalities. IMPRESSION: 1. Mild vascular congestion and fluid in the fissures, increased from prior exam suggesting mild fluid overload. Stable cardiomegaly. 2. Left basilar atelectasis without confluent airspace disease. Electronically Signed   By: Jeb Levering M.D.   On: 12/27/2017 02:06   Ct Head Wo Contrast  Result Date: 12/27/2017 CLINICAL DATA:  Altered mental status, fever. Found down at home. On blood thinners. History of hypertension, diabetes and dementia. EXAM: CT HEAD WITHOUT CONTRAST TECHNIQUE: Contiguous axial images were obtained from the base of the skull through the vertex without intravenous contrast. COMPARISON:  MRI of the head March 12, 2017 FINDINGS: BRAIN: No intraparenchymal hemorrhage, mass effect nor midline shift. Moderate ventriculomegaly. No hydrocephalus. Patchy to confluent supratentorial white matter hypodensities. No acute large vascular territory infarcts. No abnormal extra-axial fluid collections. Basal cisterns are patent. VASCULAR: Mild calcific atherosclerosis of the carotid siphons. SKULL: No skull fracture. No significant scalp soft tissue swelling. SINUSES/ORBITS: The mastoid air-cells and included paranasal sinuses are well-aerated.The included ocular globes and orbital contents are non-suspicious. OTHER: None. IMPRESSION: 1. No acute intracranial process. 2. Moderate parenchymal brain volume loss. 3. Moderate to severe chronic small vessel ischemic changes. Electronically Signed   By: Elon Alas M.D.   On:  12/27/2017 01:44    Pending Labs Unresulted Labs (From admission, onward)   Start     Ordered   12/27/17 0050  Culture, blood (Routine x 2)  BLOOD CULTURE X 2,   STAT     12/27/17 0050      Vitals/Pain Today's Vitals   12/27/17 0400 12/27/17 0430 12/27/17 0500 12/27/17 0530  BP: 111/63 114/77 (!) 136/91 (!) 124/94  Pulse:  62 81 (!) 59  Resp: (!) 24 (!) 26 (!) 30 (!) 27  Temp:      TempSrc:      SpO2:  95% 96% 95%  Weight:      Height:        Isolation Precautions No active isolations  Medications Medications  acetaminophen (TYLENOL) tablet 650 mg (has no administration in time range)    Or  acetaminophen (TYLENOL) suppository 650 mg (has no administration in time range)  ondansetron (ZOFRAN) tablet 4 mg (has no administration in time range)    Or  ondansetron (ZOFRAN) injection 4 mg (has no administration in time range)  atorvastatin (LIPITOR) tablet 40 mg (has no administration in time range)  mometasone-formoterol (DULERA) 100-5 MCG/ACT inhaler 2 puff (has no administration in time range)  metoprolol succinate (TOPROL-XL) 24 hr tablet 25 mg (has no administration in time range)  oxyCODONE (Oxy IR/ROXICODONE) immediate release tablet 5 mg (has no administration in time range)  multivitamin with minerals tablet 1 tablet (has no administration in time range)  diltiazem (CARDIZEM CD) 24 hr capsule 120 mg (has no administration in time range)  benazepril (LOTENSIN) tablet 20 mg (has no administration in time range)  acetaminophen (TYLENOL) tablet 650 mg (has no administration in time range)  diclofenac sodium (VOLTAREN) 1 %  transdermal gel 2 g (has no administration in time range)  iopamidol (ISOVUE-300) 61 % injection (has no administration in time range)  insulin aspart (novoLOG) injection 0-9 Units (has no administration in time range)  furosemide (LASIX) injection 20 mg (20 mg Intravenous Given 12/27/17 0307)  iopamidol (ISOVUE-300) 61 % injection 100 mL (100 mLs  Intravenous Contrast Given 12/27/17 0604)    Mobility walks with device (walker)

## 2017-12-27 NOTE — Progress Notes (Addendum)
[place] for Warfarin Indication: atrial fibrillation  Allergies  Allergen Reactions  . Pineapple Concentrate Nausea And Vomiting    Patient Measurements: Height: 5\' 9"  (175.3 cm) Weight: 242 lb (109.8 kg) IBW/kg (Calculated) : 70.7   Vital Signs: Temp: 101 F (38.3 C) (07/29 0111) Temp Source: Rectal (07/29 0111) BP: 153/102 (07/29 0646) Pulse Rate: 96 (07/29 0646)  Labs: Recent Labs    12/24/17 2223 12/27/17 0112 12/27/17 0119  HGB 15.3  --  15.2  HCT 47.8  --  43.6  PLT 163  --  195  LABPROT  --  37.7*  --   INR  --  3.87  --   CREATININE 1.15 1.16  --     Estimated Creatinine Clearance: 68.2 mL/min (by C-G formula based on SCr of 1.16 mg/dL).   Medical History: Past Medical History:  Diagnosis Date  . Arthritis   . Atrial fibrillation ([place])   . CAD (coronary artery disease) 2012   [place]-LAD 2 or 3 srents  . Complication of anesthesia    "loopy after polpy removal dec 2017, lasted several days"  . COPD (chronic obstructive pulmonary disease) ([place])   . Dementia   . GERD (gastroesophageal reflux disease)   . HEART FAILURE, CONGESTIVE UNSPEC 02/23/2007   Qualifier: Diagnosis of  By: Mellody Drown MD, [facility]    . Hyperlipidemia   . Hypertension   . OSA (obstructive sleep apnea) 11/13/2014  . S/P AVR    #25 mm Edwards pericardial valve Bioprosthetic. Dr Cyndia Bent.  . Situational depression    "son passed 11/2013"  . Sleep apnea    occ uses c pap does not know settings   . Stented coronary artery 2013  . TOBACCO DEPENDENCE 07/29/2006   Qualifier: History of  By: Carlena Sax  MD, Colletta [place]    . Type II diabetes mellitus ([place])     Assessment: 71 yoM admitted with altered mental status. PMH includes AS s/p bioprosthetic AVR, a-fib on chronic warfarin therapy. Pharmacy asked to manage warfarin dosing while patient in the hospital. PTA dose reported as 7.5mg  on Tues/Fri, 5mg  on all other days of the week. Last dose reported as 7/27.  INR= 3.87 on admission. CBC WNL. CT of head: No acute intracranial process.   Goal of Therapy:  INR 2-3    Plan:   No warfarin today for supratherapeutic INR  Daily PT/INR  Monitor closely for s/sx of bleeding   Lindell Spar, PharmD, BCPS Pager: [phone] 12/27/2017 7:58 AM

## 2017-12-27 NOTE — Evaluation (Signed)
Physical Therapy Evaluation Patient Details Name: Matthew Edwards MRN: [id number] DOB: [birth year] Today's Date: 12/27/2017   History of Present Illness  77yo male brought to the ED by family due to increased confusion, also due to being found on the floor at home. PMH A-fib, COPD, dementia, HTN, s/p AVR, DM, hx CABG   Clinical Impression  Patient received in bed, pleasant and willing to participate in skilled PT evaluation but apparently A&Ox2 this afternoon. He does require Min-ModAx1 for functional bed mobility; attempted standing at bedside multiple times with MaxAx1 from PT and elevated bed, however unable to clear buttocks from bed however noted very limited effort from patient. He repeatedly scooted dangerously close to the EOB and required verbal cues to scoot back to prevent him from sliding off the side of the bed. He will strongly benefit from +2 assist for progression of mobility. He was left in bed with all needs met, bed alarm activated this afternoon. He will continue to benefit from skilled PT services in the acute setting, as well as ST-SNF to further address functional deficits moving forward.     Follow Up Recommendations SNF    Equipment Recommendations  Other (comment)(defer to next venue )    Recommendations for Other Services       Precautions / Restrictions Precautions Precautions: Fall Restrictions Weight Bearing Restrictions: No      Mobility  Bed Mobility Overal bed mobility: Needs Assistance Bed Mobility: Supine to Sit;Sit to Supine     Supine to sit: Mod assist Sit to supine: Min assist   General bed mobility comments: ModA to bring truck to full upright, MinA to get legs back into bed   Transfers Overall transfer level: Needs assistance Equipment used: Rolling walker (2 wheeled) Transfers: Sit to/from Stand Sit to Stand: Max assist         General transfer comment: attempted sit to stand from elevated bed, unable to clear hips from bed even  with maxA from PT (however noted limited effort from patient); patient continues to scoot forward so far on bed that he is dangerously close to edge/close to sliding off edge and requires cues to correct   Ambulation/Gait             General Gait Details: DNT due to limited mobility and safety concerns   Stairs            Wheelchair Mobility    Modified Rankin (Stroke Patients Only)       Balance Overall balance assessment: History of Falls;Needs assistance Sitting-balance support: Bilateral upper extremity supported;Feet supported Sitting balance-Leahy Scale: Good         Standing balance comment: unable to get to standing to assess                              Pertinent Vitals/Pain Pain Assessment: Faces Pain Score: 0-No pain Faces Pain Scale: No hurt Pain Intervention(s): Limited activity within patient's tolerance;Monitored during session;Repositioned    Home Living                   Additional Comments: unsure- patient is not a reliable historian and family was not present today to clarify home/PLOF details     Prior Function Level of Independence: Independent with assistive device(s)         Comments: uses a cane at all times (taken from prior PT notes)     Hand Dominance  Extremity/Trunk Assessment   Upper Extremity Assessment Upper Extremity Assessment: Defer to OT evaluation    Lower Extremity Assessment Lower Extremity Assessment: Generalized weakness    Cervical / Trunk Assessment Cervical / Trunk Assessment: Normal  Communication   Communication: No difficulties  Cognition Arousal/Alertness: Awake/alert Behavior During Therapy: Impulsive Overall Cognitive Status: Impaired/Different from baseline Area of Impairment: Orientation;Attention;Memory;Safety/judgement;Problem solving                 Orientation Level: Disoriented to;Time;Situation Current Attention Level: Selective Memory: Decreased  short-term memory   Safety/Judgement: Decreased awareness of safety   Problem Solving: Slow processing;Difficulty sequencing;Requires verbal cues;Requires tactile cues        General Comments      Exercises     Assessment/Plan    PT Assessment Patient needs continued PT services  PT Problem List Decreased strength;Decreased mobility;Decreased safety awareness;Decreased coordination;Obesity;Decreased balance;Decreased knowledge of use of DME       PT Treatment Interventions DME instruction;Therapeutic activities;Gait training;Therapeutic exercise;Patient/family education;Stair training;Balance training;Functional mobility training;Neuromuscular re-education;Manual techniques    PT Goals (Current goals can be found in the Care Plan section)  Acute Rehab PT Goals Patient Stated Goal: to go home  PT Goal Formulation: With patient Time For Goal Achievement: 01/10/18 Potential to Achieve Goals: Fair    Frequency Min 2X/week   Barriers to discharge        Co-evaluation               AM-PAC PT "6 Clicks" Daily Activity  Outcome Measure Difficulty turning over in bed (including adjusting bedclothes, sheets and blankets)?: A Little Difficulty moving from lying on back to sitting on the side of the bed? : A Lot Difficulty sitting down on and standing up from a chair with arms (e.g., wheelchair, bedside commode, etc,.)?: A Lot Help needed moving to and from a bed to chair (including a wheelchair)?: A Lot Help needed walking in hospital room?: A Lot Help needed climbing 3-5 steps with a railing? : Total 6 Click Score: 12    End of Session Equipment Utilized During Treatment: Gait belt Activity Tolerance: Patient tolerated treatment well Patient left: in bed;with bed alarm set;with call bell/phone within reach   PT Visit Diagnosis: Muscle weakness (generalized) (M62.81);History of falling (Z91.81);Difficulty in walking, not elsewhere classified (R26.2)    Time:  4830-7354 PT Time Calculation (min) (ACUTE ONLY): 27 min   Charges:   PT Evaluation $PT Eval Low Complexity: 1 Low PT Treatments $Therapeutic Activity: 8-22 mins        Deniece Ree PT, DPT, CBIS  Supplemental Physical Therapist [place]   Pager [phone]

## 2017-12-27 NOTE — Progress Notes (Signed)
PT Cancellation Note  Patient Details Name: ABIR CRAINE MRN: [id number] DOB: Jul 22, 1940   Cancelled Treatment:    Reason Eval/Treat Not Completed: Other (comment) Attempted to perform skilled PT evaluation, patient on bedpan and requests that PT return later. Will attempt to return later in day if time/schedule allow.    Deniece Ree PT, DPT, CBIS  Supplemental Physical Therapist [facility]   Pager [phone]

## 2017-12-27 NOTE — ED Notes (Signed)
Patient has not urinated at this time for a sample.

## 2017-12-27 NOTE — ED Notes (Signed)
Bed: RESB Expected date:  Expected time:  Means of arrival:  Comments: EMS altered

## 2017-12-27 NOTE — ED Notes (Signed)
Pt has condom cath. Will collect urine specimen once pt voids. MD aware.

## 2017-12-28 ENCOUNTER — Encounter (HOSPITAL_COMMUNITY): Payer: Self-pay

## 2017-12-28 DIAGNOSIS — I10 Essential (primary) hypertension: Secondary | ICD-10-CM | POA: Diagnosis not present

## 2017-12-28 DIAGNOSIS — E118 Type 2 diabetes mellitus with unspecified complications: Secondary | ICD-10-CM | POA: Diagnosis not present

## 2017-12-28 DIAGNOSIS — I5032 Chronic diastolic (congestive) heart failure: Secondary | ICD-10-CM | POA: Diagnosis not present

## 2017-12-28 DIAGNOSIS — R410 Disorientation, unspecified: Secondary | ICD-10-CM | POA: Diagnosis not present

## 2017-12-28 DIAGNOSIS — R509 Fever, unspecified: Secondary | ICD-10-CM | POA: Diagnosis not present

## 2017-12-28 DIAGNOSIS — Z7901 Long term (current) use of anticoagulants: Secondary | ICD-10-CM | POA: Diagnosis not present

## 2017-12-28 DIAGNOSIS — I482 Chronic atrial fibrillation: Secondary | ICD-10-CM | POA: Diagnosis not present

## 2017-12-28 LAB — URINE CULTURE: Culture: NO GROWTH

## 2017-12-28 LAB — GLUCOSE, CAPILLARY
Glucose-Capillary: 183 mg/dL — ABNORMAL HIGH (ref 70–99)
Glucose-Capillary: 138 mg/dL — ABNORMAL HIGH (ref 70–99)
Glucose-Capillary: 174 mg/dL — ABNORMAL HIGH (ref 70–99)
Glucose-Capillary: 139 mg/dL — ABNORMAL HIGH (ref 70–99)
Glucose-Capillary: 136 mg/dL — ABNORMAL HIGH (ref 70–99)

## 2017-12-28 LAB — PROTIME-INR
INR: 2.97
Prothrombin Time: 30.7 seconds — ABNORMAL HIGH (ref 11.4–15.2)

## 2017-12-28 MED ORDER — FUROSEMIDE 40 MG PO TABS
40.0000 mg | ORAL_TABLET | Freq: Two times a day (BID) | ORAL | Status: DC
  Administered 2017-12-28 – 2017-12-29 (×2): 40 mg via ORAL
  Filled 2017-12-28 (×2): qty 1

## 2017-12-28 MED ORDER — INSULIN ASPART 100 UNIT/ML ~~LOC~~ SOLN
0.0000 [IU] | Freq: Three times a day (TID) | SUBCUTANEOUS | Status: DC
  Administered 2017-12-28: 1 [IU] via SUBCUTANEOUS
  Administered 2017-12-29: 2 [IU] via SUBCUTANEOUS
  Administered 2017-12-29: 1 [IU] via SUBCUTANEOUS
  Administered 2017-12-29: 2 [IU] via SUBCUTANEOUS
  Administered 2017-12-30: 1 [IU] via SUBCUTANEOUS
  Administered 2017-12-30: 2 [IU] via SUBCUTANEOUS

## 2017-12-28 MED ORDER — WARFARIN SODIUM 2.5 MG PO TABS
2.5000 mg | ORAL_TABLET | Freq: Once | ORAL | Status: AC
  Administered 2017-12-28: 2.5 mg via ORAL
  Filled 2017-12-28: qty 1

## 2017-12-28 MED ORDER — POTASSIUM CHLORIDE CRYS ER 20 MEQ PO TBCR
20.0000 meq | EXTENDED_RELEASE_TABLET | Freq: Every day | ORAL | Status: DC
  Administered 2017-12-28 – 2017-12-29 (×2): 20 meq via ORAL
  Filled 2017-12-28 (×2): qty 1

## 2017-12-28 MED ORDER — LOPERAMIDE HCL 2 MG PO CAPS
4.0000 mg | ORAL_CAPSULE | Freq: Every day | ORAL | Status: DC
  Administered 2017-12-28 – 2017-12-29 (×2): 4 mg via ORAL
  Filled 2017-12-28 (×2): qty 2

## 2017-12-28 NOTE — NC FL2 (Signed)
[place] LEVEL OF CARE SCREENING TOOL     IDENTIFICATION  Patient Name: Matthew Edwards Birthdate: May 09, 1941 Sex: male Admission Date (Current Location): 12/27/2017  [facility] [place] Number:  Herbalist and Address:  [facility],  [address], [place]      Provider Number: [id number]  Attending Physician Name and Address:  Mariel Aloe, MD  Relative Name and Phone Number:       Current Level of Care: Hospital Recommended Level of Care: [place] Prior Approval Number:    Date Approved/Denied: 12/28/17 PASRR Number: [id number]  Discharge Plan: SNF    Current Diagnoses: Patient Active Problem List   Diagnosis Date Noted  . Delirium 12/27/2017  . Fever 12/27/2017  . Chronic diastolic CHF (congestive heart failure) ([place]) 12/27/2017  . Counseling and coordination of care 11/20/2017  . Cellulitis of abdominal wall 11/11/2017  . Lateral epicondylitis of right elbow 09/27/2016  . Acute pain of both knees 09/08/2016  . Chronic bilateral low back pain without sciatica 09/08/2016  . Adenomatous rectal polyp with high grade dysplasia s/p TEM resection 05/26/2016 05/26/2016  . Spinal stenosis of lumbar region 11/28/2015  . Abnormality of gait   . Type 2 diabetes mellitus with complication, without long-term current use of insulin ([place])   . Constipation 01/28/2015  . OSA (obstructive sleep apnea) 11/13/2014  . Pulmonary HTN ([place]) 08/24/2014  . Chronic anticoagulation   . Chronic atrial fibrillation ([place])   . Acute on chronic congestive heart failure with left ventricular diastolic dysfunction ([place]) 08/02/2014  . Cognitive impairment 07/02/2014  . Abdominal distention 06/28/2014  . S/P AVR 12/29/2012  . Diabetic neuropathy ([place]) 11/25/2011  . Excessive tearing 10/06/2011  . BENIGN POSITIONAL VERTIGO 06/14/2010  . Dizziness 06/14/2010  . Vitamin D deficiency 02/21/2010  . Primary hyperparathyroidism  ([place]) 06/04/2009  . Hypercalcemia 05/29/2009  . Essential hypertension 02/23/2007  . DEPRESSION 12/15/2006  . Coronary atherosclerosis 12/15/2006  . GERD 12/15/2006  . COPD (chronic obstructive pulmonary disease) ([place]) 12/10/2006  . HLD (hyperlipidemia) 07/29/2006  . Alcohol abuse 07/29/2006  . Aortic valve disorder 07/29/2006    Orientation RESPIRATION BLADDER Height & Weight     Self  Normal Continent Weight: 242 lb (109.8 kg) Height:  5\' 9"  (175.3 cm)  BEHAVIORAL SYMPTOMS/MOOD NEUROLOGICAL BOWEL NUTRITION STATUS      Continent Diet(See dc summary)  AMBULATORY STATUS COMMUNICATION OF NEEDS Skin   Extensive Assist Verbally Normal                       Personal Care Assistance Level of Assistance  Bathing, Feeding, Dressing Bathing Assistance: Limited assistance Feeding assistance: Independent Dressing Assistance: Limited assistance     Functional Limitations Info  Sight, Hearing, Speech Sight Info: Adequate Hearing Info: Adequate Speech Info: Adequate    SPECIAL CARE FACTORS FREQUENCY  PT (By licensed PT), OT (By licensed OT)     PT Frequency: 5x/week OT Frequency: 5x/week            Contractures Contractures Info: Not present    Additional Factors Info  Code Status, Allergies Code Status Info: Full Allergies Info: Pineapple Concentrate           Current Medications (12/28/2017):  This is the current hospital active medication list Current Facility-Administered Medications  Medication Dose Route Frequency Provider Last Rate Last Dose  . acetaminophen (TYLENOL) tablet 650 mg  650 mg Oral Q6H PRN [name], DO  650 mg at 12/28/17 1610   Or  . acetaminophen (TYLENOL) suppository 650 mg  650 mg Rectal Q6H PRN [name], DO      . atorvastatin (LIPITOR) tablet 40 mg  40 mg Oral q1800 [name], DO   40 mg at 12/27/17 1730  . benazepril (LOTENSIN) tablet 20 mg  20 mg Oral Daily Jennette Kettle M, DO   20 mg at 12/28/17 1106  .  diclofenac sodium (VOLTAREN) 1 % transdermal gel 2 g  2 g Topical BID [name], DO   2 g at 12/28/17 1107  . diltiazem (CARDIZEM CD) 24 hr capsule 120 mg  120 mg Oral QPM Jennette Kettle M, DO   120 mg at 12/27/17 1730  . insulin aspart (novoLOG) injection 0-9 Units  0-9 Units Subcutaneous Q4H [name], DO   2 Units at 12/28/17 1209  . metoprolol succinate (TOPROL-XL) 24 hr tablet 25 mg  25 mg Oral Daily Jennette Kettle M, DO   25 mg at 12/28/17 1106  . mometasone-formoterol (DULERA) 100-5 MCG/ACT inhaler 2 puff  2 puff Inhalation BID [name], DO   2 puff at 12/28/17 0749  . multivitamin with minerals tablet 1 tablet  1 tablet Oral QPM [name], DO   1 tablet at 12/27/17 1730  . ondansetron (ZOFRAN) tablet 4 mg  4 mg Oral Q6H PRN [name], DO       Or  . ondansetron [facility]) injection 4 mg  4 mg Intravenous Q6H PRN [name], DO      . oxyCODONE (Oxy IR/ROXICODONE) immediate release tablet 5 mg  5 mg Oral Q4H PRN [name], DO      . warfarin (COUMADIN) tablet 2.5 mg  2.5 mg Oral ONCE-1800 Luiz Ochoa, [facility]      . Warfarin - Pharmacist Dosing Inpatient   Does not apply q1800 Luiz Ochoa, [facility]   Stopped at 12/27/17 1800     Discharge Medications: Please see discharge summary for a list of discharge medications.  Relevant Imaging Results:  Relevant Lab Results:   Additional Information SS: [id number]  Servando Snare, LCSW

## 2017-12-28 NOTE — Progress Notes (Signed)
PROGRESS NOTE    Matthew Edwards  XIP:[id number] DOB: May 31, 1941 DOA: 12/27/2017 PCP: Donnamae Jude, MD   Brief Narrative: Matthew Edwards is a 77 y.o. male with medical history significant of dementia, AS s/p AVR with bioprosthetic, a.fib rate controlled on coumadin, CAD, OSA doesn't tolerate CPAP, DM2, HTN, diastolic CHF. He presented secondary to altered mental status and being found down. He initially had a fever which has not returned. Delirium resolved. Abdominal CT not significant for source of infection. Blood culture unremarkable to date. PT recommending SNF.   Assessment & Plan:   Principal Problem:   Fever Active Problems:   Essential hypertension   Chronic atrial fibrillation (HCC)   Chronic anticoagulation   Type 2 diabetes mellitus with complication, without long-term current use of insulin (HCC)   Delirium   Chronic diastolic CHF (congestive heart failure) (HCC)   Fever and delirium No LP performed on admission. No recurrent fever and mental status baseline. No evidence of abscess on CT abdomen/pelvis. Blood culture no growth to date. Urine culture negative.  Chronic diastolic heart failure Last EF of 50-55%. On Lasix and potassium -Continue benazepril and metoprolol -Restart lasix  Essential hypertension -Continue diltiazem, benazepril, metoprolol  Chronic atrial fibrillation -Continue Warfarin, metoprolol and diltiazem  Diabetes mellitus, type 2 On glipizide as an outpatient -Continue SSI -May be beneficial to discontinue glipizide as an outpatient secondary to increased risk of hypoglycemia in elderly  Chronic fecal incontinence On imodium as an outpatient -Restart imodium  COPD -Continue Dulera   DVT prophylaxis: Warfarin Code Status:   Code Status: Full Code Family Communication: None at bedside Disposition Plan: Discharge to SNF likely in 24 hours   Consultants:   None  Procedures:   None  Antimicrobials:  None     Subjective: Some right sided pain, mild.  Objective: Vitals:   12/27/17 2054 12/28/17 0353 12/28/17 0749 12/28/17 1053  BP: 105/74 97/69  123/62  Pulse: 85 70 83 78  Resp: (!) 22 20 18    Temp: 97.8 F (36.6 C) 98.3 F (36.8 C)    TempSrc: Oral Oral    SpO2: 94% 92% 93%   Weight:      Height:        Intake/Output Summary (Last 24 hours) at 12/28/2017 1401 Last data filed at 12/28/2017 0900 Gross per 24 hour  Intake 360 ml  Output 400 ml  Net -40 ml   Filed Weights   12/27/17 0015 12/27/17 0020  Weight: 109.8 kg (242 lb) 109.8 kg (242 lb)    Examination:  General exam: Appears calm and comfortable Respiratory system: Clear to auscultation. Respiratory effort normal. Cardiovascular system: S1 & S2 heard, RRR. No murmurs, rubs, gallops or clicks. Gastrointestinal system: Abdomen is nondistended, soft and nontender. No organomegaly or masses felt. Normal bowel sounds heard. Central nervous system: Alert and oriented to person. No focal neurological deficits. Extremities: No edema. No calf tenderness Skin: No cyanosis. No rashes Psychiatry: Judgement and insight appear normal. Mood & affect appropriate.     Data Reviewed: I have personally reviewed following labs and imaging studies  CBC: Recent Labs  Lab 12/24/17 2223 12/27/17 0119  WBC 7.3 9.8  NEUTROABS  --  8.1*  HGB 15.3 15.2  HCT 47.8 43.6  MCV 91.6 90.5  PLT 163 673   Basic Metabolic Panel: Recent Labs  Lab 12/24/17 2223 12/27/17 0112  NA 137 139  K 3.8 4.0  CL 97* 96*  CO2 27 32  GLUCOSE 102* 117*  BUN 14 17  CREATININE 1.15 1.16  CALCIUM 9.8 10.3   GFR: Estimated Creatinine Clearance: 68.2 mL/min (by C-G formula based on SCr of 1.16 mg/dL). Liver Function Tests: Recent Labs  Lab 12/27/17 0112  AST 21  ALT 12  ALKPHOS 67  BILITOT 1.3*  PROT 8.0  ALBUMIN 4.0   No results for input(s): LIPASE, AMYLASE in the last 168 hours. No results for input(s): AMMONIA in the last 168  hours. Coagulation Profile: Recent Labs  Lab 12/27/17 0112 12/28/17 0545  INR 3.87 2.97   Cardiac Enzymes: Recent Labs  Lab 12/27/17 0112  CKTOTAL 202   BNP (last 3 results) No results for input(s): PROBNP in the last 8760 hours. HbA1C: No results for input(s): HGBA1C in the last 72 hours. CBG: Recent Labs  Lab 12/27/17 2049 12/27/17 2343 12/28/17 0348 12/28/17 0731 12/28/17 1133  GLUCAP 137* 147* 183* 138* 174*   Lipid Profile: No results for input(s): CHOL, HDL, LDLCALC, TRIG, CHOLHDL, LDLDIRECT in the last 72 hours. Thyroid Function Tests: No results for input(s): TSH, T4TOTAL, FREET4, T3FREE, THYROIDAB in the last 72 hours. Anemia Panel: No results for input(s): VITAMINB12, FOLATE, FERRITIN, TIBC, IRON, RETICCTPCT in the last 72 hours. Sepsis Labs: Recent Labs  Lab 12/27/17 0125 12/27/17 0323  LATICACIDVEN 1.77 1.03    Recent Results (from the past 240 hour(s))  Culture, Urine     Status: None   Collection Time: 12/27/17  1:13 AM  Result Value Ref Range Status   Specimen Description   Final    URINE, CLEAN CATCH Performed at [facility] [address], [place], [place] [address]    Special Requests   Final    NONE Performed at [facility], [place] [address] [place], [place] [address]    Culture   Final    NO GROWTH Performed at [facility], [place] [address], [place], [place] [address]    Report Status 12/28/2017 FINAL  Final         Radiology Studies: Dg Chest 2 View  Result Date: 12/27/2017 CLINICAL DATA:  Altered mental status.  Low grade fever. EXAM: CHEST - 2 VIEW COMPARISON:  Radiographs 3 days ago 12/24/2017 FINDINGS: Post median sternotomy with prosthetic aortic valve. Unchanged cardiomegaly and mediastinal contours. Mild vascular congestion with fluid in the fissures, increased from prior exam. Mild left basilar atelectasis without confluent airspace disease. No pleural effusion or  pneumothorax. No acute osseous abnormalities. IMPRESSION: 1. Mild vascular congestion and fluid in the fissures, increased from prior exam suggesting mild fluid overload. Stable cardiomegaly. 2. Left basilar atelectasis without confluent airspace disease. Electronically Signed   By: Jeb Levering M.D.   On: 12/27/2017 02:06   Ct Head Wo Contrast  Result Date: 12/27/2017 CLINICAL DATA:  Altered mental status, fever. Found down at home. On blood thinners. History of hypertension, diabetes and dementia. EXAM: CT HEAD WITHOUT CONTRAST TECHNIQUE: Contiguous axial images were obtained from the base of the skull through the vertex without intravenous contrast. COMPARISON:  MRI of the head March 12, 2017 FINDINGS: BRAIN: No intraparenchymal hemorrhage, mass effect nor midline shift. Moderate ventriculomegaly. No hydrocephalus. Patchy to confluent supratentorial white matter hypodensities. No acute large vascular territory infarcts. No abnormal extra-axial fluid collections. Basal cisterns are patent. VASCULAR: Mild calcific atherosclerosis of the carotid siphons. SKULL: No skull fracture. No significant scalp soft tissue swelling. SINUSES/ORBITS: The mastoid air-cells and included paranasal sinuses are well-aerated.The included ocular globes and orbital contents are non-suspicious. OTHER:  None. IMPRESSION: 1. No acute intracranial process. 2. Moderate parenchymal brain volume loss. 3. Moderate to severe chronic small vessel ischemic changes. Electronically Signed   By: Elon Alas M.D.   On: 12/27/2017 01:44   Ct Abdomen Pelvis W Contrast  Result Date: 12/27/2017 CLINICAL DATA:  Fever. Back pain with ambulation. Question psoas abscess. EXAM: CT ABDOMEN AND PELVIS WITH CONTRAST TECHNIQUE: Multidetector CT imaging of the abdomen and pelvis was performed using the standard protocol following bolus administration of intravenous contrast. CONTRAST:  142mL ISOVUE-300 IOPAMIDOL (ISOVUE-300) INJECTION 61%  COMPARISON:  None. FINDINGS: Lower chest: Mild cardiomegaly with coronary artery calcifications. Mitral annulus calcifications. Hepatobiliary: Borderline hepatic steatosis. No discrete focal lesion. Scattered hepatic granulomas. Gallbladder physiologically distended, no calcified stone. No biliary dilatation. Pancreas: Mild fatty atrophy.  No ductal dilatation or inflammation. Spleen: Normal in size without focal abnormality. Adrenals/Urinary Tract: No adrenal nodule. No hydronephrosis or perinephric edema. Homogeneous renal enhancement with symmetric excretion on delayed phase imaging. Nonobstructing punctate stone in the mid right kidney. Small cyst in the lower right kidney. Urinary bladder is physiologically distended without wall thickening. Stomach/Bowel: Stomach is within normal limits. Appendix appears normal. No evidence of bowel wall thickening, distention, or inflammatory changes. Moderate stool throughout the colon. Vascular/Lymphatic: Moderate aortic and branch atherosclerosis. No aneurysm. No enlarged lymph nodes in the abdomen or pelvis. Reproductive: Normal sized prostate gland with central calcifications. Other: No free air, free fluid, or intra-abdominal fluid collection. Musculoskeletal: No psoas fluid collection or abscess. No evidence of paraspinal abscess. Multilevel degenerative change in the lumbar spine. No inflammatory changes or findings to suggest discitis osteomyelitis. Partial ankylosis of the sacroiliac joints may be degenerative or related to inflammatory arthropathy. IMPRESSION: 1. No acute findings in the abdomen/pelvis. Particularly, no evidence of psoas abscess. 2. Incidental findings of nonobstructing right renal stone, borderline hepatic steatosis and Aortic Atherosclerosis (ICD10-I70.0). Coronary artery calcifications noted in the included lower chest. Electronically Signed   By: Jeb Levering M.D.   On: 12/27/2017 06:50        Scheduled Meds: . atorvastatin  40 mg  Oral q1800  . benazepril  20 mg Oral Daily  . diclofenac sodium  2 g Topical BID  . diltiazem  120 mg Oral QPM  . insulin aspart  0-9 Units Subcutaneous Q4H  . metoprolol succinate  25 mg Oral Daily  . mometasone-formoterol  2 puff Inhalation BID  . multivitamin with minerals  1 tablet Oral QPM  . warfarin  2.5 mg Oral ONCE-1800  . Warfarin - Pharmacist Dosing Inpatient   Does not apply q1800   Continuous Infusions:   LOS: 0 days     Cordelia Poche, MD [facility] 12/28/2017, 2:01 PM Pager: [phone]  If 7PM-7AM, please contact night-coverage [email or web address] 12/28/2017, 2:01 PM

## 2017-12-28 NOTE — Clinical Social Work Note (Signed)
Clinical Social Work Assessment  Patient Details  Name: Matthew Edwards MRN: [id number] Date of Birth: [birth year]  Date of referral:  12/28/17               Reason for consult:  Facility Placement                Permission sought to share information with:  Case Manager, Customer service manager, Family Supports Permission granted to share information::  No(patietn not oriented)  Name::     Emergency planning/management officer::  SNF  Relationship::  Daughter  Contact Information:     Housing/Transportation Living arrangements for the past 2 months:  Apartment Source of Information:  Adult Children Patient Interpreter Needed:  None Criminal Activity/Legal Involvement Pertinent to Current Situation/Hospitalization:  No - Comment as needed Significant Relationships:  Adult Children, Warehouse manager Lives with:  Self Do you feel safe going back to the place where you live?  Yes Need for family participation in patient care:  Yes (Comment)  Care giving concerns:  No care giving concerns at the time of assessment.    Social Worker assessment / plan:  LCSW consulted for SNF placement.   LCSW met with patient at bedside patient is confused. LCSW spoke with patient's daughter, [name] by phone.   According to patient's daughter, patient is from home alone. [name] states that patient requires assistance with ADL's and uses a cane and/or walker at baseline. At baseline she reports that patient bathes and dresses independently. Due to patient's dementia daughter reports patient having sitters during the day and patient stayed with her at night. Daughter reports confusion has been worsening.   Daughter is agreeable to SNF for rehab at dc. Daughter prefers [facility]. LCSW faxed patient out. Patient has [employer] and will require per [place].  PLAN: SNF at dc.     Employment status:  Retired Nurse, adult PT Recommendations:  [place] / Referral to  community resources:     Patient/Family's Response to care:  Daughter is thankful for [employer] coordination with dc planning.   Patient/Family's Understanding of and Emotional Response to Diagnosis, Current Treatment, and Prognosis:  Daughter [name] is proactive in patient's care. Daughter is realistic about patient's curent condition and agreeable to SNF at dc. Daughter had questions about patient's expected dc date.   Emotional Assessment Appearance:  Appears stated age Attitude/Demeanor/Rapport:  Unable to Assess Affect (typically observed):  Calm Orientation:  Oriented to Self Alcohol / Substance use:  Not Applicable Psych involvement (Current and /or in the community):  No (Comment)  Discharge Needs  Concerns to be addressed:  No discharge needs identified Readmission within the last 30 days:  No Current discharge risk:  None Barriers to Discharge:  Continued Medical Work up   [employer], LCSW 12/28/2017, 2:12 PM

## 2017-12-28 NOTE — Evaluation (Signed)
Occupational Therapy Evaluation Patient Details Name: Matthew Edwards MRN: [id number] DOB: 01/20/1941 Today's Date: 12/28/2017    History of Present Illness 77yo male brought to the ED by family due to increased confusion, also due to being found on the floor at home. PMH A-fib, COPD, dementia, HTN, s/p AVR, DM, hx CABG    Clinical Impression   Pt was admitted for the above. At baseline, he lives alone per chart. Unsure if he has any support for adls at baseline. Pt needs +2 for safety with mod to max safety cues. He is delayed in responding at times and therapists must wait for him to initiate first before standing. Will follow in acute setting with min guard level goals    Follow Up Recommendations  SNF    Equipment Recommendations  3 in 1 bedside commode    Recommendations for Other Services       Precautions / Restrictions Precautions Precautions: Fall Restrictions Weight Bearing Restrictions: No      Mobility Bed Mobility         Supine to sit: Min assist;HOB elevated     General bed mobility comments: extra time; cues for safety when scooting forward as pt comes too close to edge  Transfers   Equipment used: Rolling walker (2 wheeled)             General transfer comment: +2 assist. First attempt, pt did not help. Then from elevated bed, mod +2, finally from 3:1 min +2 safety    Balance Overall balance assessment: History of Falls;Needs assistance                                         ADL either performed or assessed with clinical judgement   ADL Overall ADL's : Needs assistance/impaired Eating/Feeding: Set up;Sitting   Grooming: Set up;Wash/dry hands;Wash/dry face;Sitting   Upper Body Bathing: Set up;Sitting   Lower Body Bathing: Moderate assistance;+2 for physical assistance;Sit to/from stand   Upper Body Dressing : Minimal assistance;Sitting   Lower Body Dressing: Maximal assistance;+2 for physical assistance;Sit to/from  stand   Toilet Transfer: Moderate assistance;Minimal assistance;+2 for physical assistance;BSC;RW   Toileting- Clothing Manipulation and Hygiene: Maximal assistance;+2 for physical assistance         General ADL Comments: performed ADL and transferred to 3;1 commode then chair     Vision         Perception     Praxis      Pertinent Vitals/Pain Pain Assessment: No/denies pain     Hand Dominance Right   Extremity/Trunk Assessment Upper Extremity Assessment Upper Extremity Assessment: Generalized weakness           Communication Communication Communication: HOH   Cognition Arousal/Alertness: Awake/alert Behavior During Therapy: Impulsive Overall Cognitive Status: No family/caregiver present to determine baseline cognitive functioning                   Orientation Level: Disoriented to;Time;Situation Current Attention Level: Sustained Memory: Decreased short-term memory   Safety/Judgement: Decreased awareness of safety   Problem Solving: Slow processing;Difficulty sequencing;Requires verbal cues;Requires tactile cues General Comments: difficulty following commands at times; slow processing; decreased safety--mod to max safety cues needed   General Comments       Exercises     Shoulder Instructions      Home Living Family/patient expects to be discharged to:: Unsure  Additional Comments: will need snf; lived alone prior to admission      Prior Functioning/Environment Level of Independence: Independent with assistive device(s)        Comments: uses a cane at all times (taken from prior PT notes)        OT Problem List: Decreased strength;Decreased activity tolerance;Impaired balance (sitting and/or standing);Decreased cognition;Decreased safety awareness;Decreased knowledge of use of DME or AE      OT Treatment/Interventions: Self-care/ADL training;Therapeutic exercise;DME and/or AE  instruction;Therapeutic activities;Patient/family education;Cognitive remediation/compensation;Balance training    OT Goals(Current goals can be found in the care plan section) Acute Rehab OT Goals Patient Stated Goal: to go home  OT Goal Formulation: With patient Time For Goal Achievement: 01/11/18 Potential to Achieve Goals: Good ADL Goals Pt Will Transfer to Toilet: with +2 assist;with min guard assist Additional ADL Goal #1: pt will perform ADL, sit to stand with min guard +2 safety Additional ADL Goal #2: pt will not need more than two safety cues per activity  OT Frequency: Min 2X/week   Barriers to D/C:            Co-evaluation              AM-PAC PT "6 Clicks" Daily Activity     Outcome Measure Help from another person eating meals?: A Little Help from another person taking care of personal grooming?: A Little Help from another person toileting, which includes using toliet, bedpan, or urinal?: A Lot Help from another person bathing (including washing, rinsing, drying)?: A Lot Help from another person to put on and taking off regular upper body clothing?: A Little Help from another person to put on and taking off regular lower body clothing?: A Lot 6 Click Score: 15   End of Session    Activity Tolerance: Patient tolerated treatment well Patient left: in chair;with call bell/phone within reach;with chair alarm set;with nursing/sitter in room  OT Visit Diagnosis: Muscle weakness (generalized) (M62.81);Unsteadiness on feet (R26.81)                Time: 1610-9604 OT Time Calculation (min): 44 min Charges:  OT General Charges $OT Visit: 1 Visit OT Evaluation $OT Eval Low Complexity: 1 Low OT Treatments $Self Care/Home Management : 23-37 mins  Lesle Chris, OTR/L [phone] 12/28/2017  Matthew Edwards 12/28/2017, 9:36 AM

## 2017-12-28 NOTE — Progress Notes (Signed)
[place] for Warfarin Indication: atrial fibrillation  Allergies  Allergen Reactions  . Pineapple Concentrate Nausea And Vomiting    Patient Measurements: Height: 5\' 9"  (175.3 cm) Weight: 242 lb (109.8 kg) IBW/kg (Calculated) : 70.7   Vital Signs: Temp: 98.3 F (36.8 C) (07/30 0353) Temp Source: Oral (07/30 0353) BP: 123/62 (07/30 1053) Pulse Rate: 78 (07/30 1053)  Labs: Recent Labs    12/27/17 0112 12/27/17 0119 12/28/17 0545  HGB  --  15.2  --   HCT  --  43.6  --   PLT  --  195  --   LABPROT 37.7*  --  30.7*  INR 3.87  --  2.97  CREATININE 1.16  --   --   CKTOTAL 202  --   --     Estimated Creatinine Clearance: 68.2 mL/min (by C-G formula based on SCr of 1.16 mg/dL).   Medical History: Past Medical History:  Diagnosis Date  . Arthritis   . Atrial fibrillation ([place])   . CAD (coronary artery disease) 2012   [place]-LAD 2 or 3 srents  . Complication of anesthesia    "loopy after polpy removal dec 2017, lasted several days"  . COPD (chronic obstructive pulmonary disease) ([place])   . Dementia   . GERD (gastroesophageal reflux disease)   . HEART FAILURE, CONGESTIVE UNSPEC 02/23/2007   Qualifier: Diagnosis of  By: Mellody Drown MD, [facility]    . Hyperlipidemia   . Hypertension   . OSA (obstructive sleep apnea) 11/13/2014  . S/P AVR    #25 mm Edwards pericardial valve Bioprosthetic. Dr Cyndia Bent.  . Situational depression    "son passed 11/2013"  . Sleep apnea    occ uses c pap does not know settings   . Stented coronary artery 2013  . TOBACCO DEPENDENCE 07/29/2006   Qualifier: History of  By: Carlena Sax  MD, [facility]    . Type II diabetes mellitus ([place])     Assessment: 17 yoM admitted with altered mental status. PMH includes AS s/p bioprosthetic AVR, a-fib on chronic warfarin therapy. Pharmacy asked to manage warfarin dosing while patient in the hospital. PTA dose reported as 7.5mg  on Tues/Fri, 5mg  on all other days of the week. Last  dose reported as 7/27. INR= 3.87 on admission. CBC WNL. CT of head: No acute intracranial process.   Today, 12/28/17:  INR down to therapeutic range, 2.97  Heart healthy/carb modified diet, ate 100% of breakfast  No major DDIs  Goal of Therapy:  INR 2-3    Plan:   Warfarin 2.5mg  PO x 1 today   Daily PT/INR  Monitor closely for s/sx of bleeding   Lindell Spar, PharmD, BCPS Pager: [phone] 12/28/2017 11:28 AM

## 2017-12-29 DIAGNOSIS — G9341 Metabolic encephalopathy: Secondary | ICD-10-CM

## 2017-12-29 DIAGNOSIS — R410 Disorientation, unspecified: Secondary | ICD-10-CM | POA: Diagnosis not present

## 2017-12-29 LAB — BASIC METABOLIC PANEL
Anion gap: 10 (ref 5–15)
BUN: 21 mg/dL (ref 8–23)
CO2: 30 mmol/L (ref 22–32)
Calcium: 10.3 mg/dL (ref 8.9–10.3)
Chloride: 96 mmol/L — ABNORMAL LOW (ref 98–111)
Creatinine, Ser: 1.03 mg/dL (ref 0.61–1.24)
GFR calc Af Amer: >60 mL/min (ref >60)
GFR calc non Af Amer: >60 mL/min (ref >60)
Glucose, Bld: 148 mg/dL — ABNORMAL HIGH (ref 70–99)
Potassium: 4 mmol/L (ref 3.5–5.1)
Sodium: 136 mmol/L (ref 135–145)

## 2017-12-29 LAB — PROTIME-INR
INR: 2.16
Prothrombin Time: 23.9 seconds — ABNORMAL HIGH (ref 11.4–15.2)

## 2017-12-29 LAB — GLUCOSE, CAPILLARY
Glucose-Capillary: 151 mg/dL — ABNORMAL HIGH (ref 70–99)
Glucose-Capillary: 151 mg/dL — ABNORMAL HIGH (ref 70–99)
Glucose-Capillary: 139 mg/dL — ABNORMAL HIGH (ref 70–99)
Glucose-Capillary: 142 mg/dL — ABNORMAL HIGH (ref 70–99)

## 2017-12-29 LAB — FOLATE: Folate: 9.8 ng/mL (ref >5.9)

## 2017-12-29 LAB — VITAMIN B12: Vitamin B-12: 310 pg/mL (ref 180–914)

## 2017-12-29 LAB — TSH: TSH: 2.253 u[IU]/mL (ref 0.350–4.500)

## 2017-12-29 LAB — T4, FREE: Free T4: 1.11 ng/dL (ref 0.82–1.77)

## 2017-12-29 MED ORDER — B COMPLEX-C PO TABS
1.0000 | ORAL_TABLET | Freq: Every day | ORAL | Status: DC
  Administered 2017-12-29 – 2017-12-30 (×2): 1 via ORAL
  Filled 2017-12-29 (×2): qty 1

## 2017-12-29 MED ORDER — WARFARIN SODIUM 5 MG PO TABS
7.5000 mg | ORAL_TABLET | Freq: Once | ORAL | Status: AC
  Administered 2017-12-29: 7.5 mg via ORAL
  Filled 2017-12-29: qty 1

## 2017-12-29 MED ORDER — CYANOCOBALAMIN 1000 MCG/ML IJ SOLN
1000.0000 ug | Freq: Once | INTRAMUSCULAR | Status: AC
  Administered 2017-12-29: 1000 ug via SUBCUTANEOUS
  Filled 2017-12-29: qty 1

## 2017-12-29 MED ORDER — THIAMINE HCL 100 MG/ML IJ SOLN
100.0000 mg | Freq: Once | INTRAMUSCULAR | Status: AC
  Administered 2017-12-29: 100 mg via INTRAVENOUS
  Filled 2017-12-29: qty 2

## 2017-12-29 NOTE — Progress Notes (Signed)
[place] for Warfarin Indication: atrial fibrillation  Allergies  Allergen Reactions  . Pineapple Concentrate Nausea And Vomiting    Patient Measurements: Height: 5\' 9"  (175.3 cm) Weight: 242 lb (109.8 kg) IBW/kg (Calculated) : 70.7   Vital Signs: Temp: 98.7 F (37.1 C) (07/31 0506) BP: 101/66 (07/31 0506) Pulse Rate: 82 (07/31 0506)  Labs: Recent Labs    12/27/17 0112 12/27/17 0119 12/28/17 0545 12/29/17 0548  HGB  --  15.2  --   --   HCT  --  43.6  --   --   PLT  --  195  --   --   LABPROT 37.7*  --  30.7* 23.9*  INR 3.87  --  2.97 2.16  CREATININE 1.16  --   --   --   CKTOTAL 202  --   --   --     Estimated Creatinine Clearance: 68.2 mL/min (by C-G formula based on SCr of 1.16 mg/dL).   Medical History: Past Medical History:  Diagnosis Date  . Arthritis   . Atrial fibrillation ([place])   . CAD (coronary artery disease) 2012   [place]-LAD 2 or 3 srents  . Complication of anesthesia    "loopy after polpy removal dec 2017, lasted several days"  . COPD (chronic obstructive pulmonary disease) ([place])   . Dementia   . GERD (gastroesophageal reflux disease)   . HEART FAILURE, CONGESTIVE UNSPEC 02/23/2007   Qualifier: Diagnosis of  By: Mellody Drown MD, [facility]    . Hyperlipidemia   . Hypertension   . OSA (obstructive sleep apnea) 11/13/2014  . S/P AVR    #25 mm Edwards pericardial valve Bioprosthetic. Dr Cyndia Bent.  . Situational depression    "son passed 11/2013"  . Sleep apnea    occ uses c pap does not know settings   . Stented coronary artery 2013  . TOBACCO DEPENDENCE 07/29/2006   Qualifier: History of  By: Carlena Sax  MD, [facility] [place]    . Type II diabetes mellitus ([place])     Assessment: 67 yoM admitted with altered mental status. PMH includes AS s/p bioprosthetic AVR, a-fib on chronic warfarin therapy. Pharmacy asked to manage warfarin dosing while patient in the hospital. PTA dose reported as 7.5mg  on Tues/Fri, 5mg  on all other days  of the week. Last dose reported as 7/27. INR= 3.87 on admission. CBC WNL. CT of head: No acute intracranial process.   Today, 12/29/17:  INR 2.16 today, at low end of therapeutic range after resumption of warfarin yesterday   Heart healthy/carb modified diet, ate 50% of breakfast  No major DDIs  No bleeding issues noted  Goal of Therapy:  INR 2-3    Plan:   Warfarin 7.5mg  PO x 1 today to avoid further drop in INR  Daily PT/INR  Monitor closely for s/sx of bleeding   Lindell Spar, PharmD, BCPS Pager: [phone] 12/29/2017 12:29 PM

## 2017-12-29 NOTE — Progress Notes (Signed)
PROGRESS NOTE    Matthew Edwards  MCN:[id number] DOB: 15-Jul-1940 DOA: 12/27/2017 PCP: Donnamae Jude, MD   Brief Narrative: Matthew Edwards is a 77 y.o. male with medical history significant of dementia, AS s/p AVR with bioprosthetic, a.fib rate controlled on coumadin, CAD, OSA doesn't tolerate CPAP, DM2, HTN, diastolic CHF. He presented secondary to altered mental status and being found down. He initially had a fever which has not returned. Delirium resolved. Abdominal CT not significant for source of infection. Blood culture unremarkable to date. PT recommending SNF.   Assessment & Plan:   Principal Problem:   Fever Active Problems:   Essential hypertension   Chronic atrial fibrillation (HCC)   Chronic anticoagulation   Type 2 diabetes mellitus with complication, without long-term current use of insulin (HCC)   Delirium   Chronic diastolic CHF (congestive heart failure) (HCC)  Dementia Acute metabolic encephalopathy secondary to delirium Polypharmacy? Relative B12 deficiency No LP performed on admission.  Febrile on admission, no recurrent fever Discussed with son who is at bedside today, feels that the patient is much improved from admission but still not back to baseline in terms of mentation, his conversation are still tangential which is not his baseline. Patient lives alone at home is independent for his medication as well as for his daily ADL. CT head unremarkable. Metabolic work-up shows low normal B12 level. Provide subcu B12 x1 followed by daily B12 supplementation. Also give B1. No evidence of abscess on CT abdomen/pelvis. Blood culture no growth to date. Urine culture negative. Was started on oxycodone on admission which I will discontinue.  Chronic diastolic heart failure Essential hypertension Hypotension Sinus bradycardia Chronic A. fib. Blood pressure running low normal heart rate in 50s. Last EF of 50-55%. On Lasix and potassium -Continue benazepril and  Cardizem -Hold Lasix and metoprolol Also continue warfarin.  Diabetes mellitus, type 2 On glipizide as an outpatient -Continue SSI -Discontinue glipizide on discharge  Chronic fecal incontinence Poor p.o. intake. On imodium as an outpatient Had 5 bowel movements yesterday loose watery without any blood.  No abdominal pain.  Minimal oral intake.   CT abdomen pelvis unremarkable for any acute abnormality. -Restart imodium as needed  COPD -Continue Dulera  Generalized weakness. Likely combination of hypotension and diarrhea with poor p.o. intake and malnutrition deficiencies. Patient requires 2+ assist for basic dressing and bathing.  Per his son, patient is fairly independent, lives alone at home and is responsible for his meals and medication.   DVT prophylaxis: Warfarin Code Status:   Code Status: Full Code Family Communication: Son at bedside Disposition Plan: Discharge to SNF pending improvement in diarrhea as well as oral intake.  Consultants:   None  Procedures:   None  Antimicrobials:  None    Subjective: Denies any acute complaint.  No nausea no vomiting.  Unable to tell me where he is or his date of birth.  Did not sleep last night due to diarrhea.  Objective: Vitals:   12/28/17 1938 12/28/17 2034 12/29/17 0506 12/29/17 1401  BP:  138/76 101/66 93/62  Pulse:  (!) 102 82 (!) 54  Resp:  17 20   Temp:  98.4 F (36.9 C) 98.7 F (37.1 C) 98 F (36.7 C)  TempSrc:    Oral  SpO2: 92% 94% 96% 91%  Weight:      Height:        Intake/Output Summary (Last 24 hours) at 12/29/2017 1517 Last data filed at 12/29/2017 1151 Gross per 24 hour  Intake 600 ml  Output -  Net 600 ml   Filed Weights   12/27/17 0015 12/27/17 0020  Weight: 109.8 kg (242 lb) 109.8 kg (242 lb)    Examination:  General exam: Appears calm and comfortable alert and awake, oriented to self. Respiratory system: Clear to auscultation. Respiratory effort normal. Cardiovascular system:  S1 & S2 heard, RRR. No murmurs, rubs, gallops or clicks. Gastrointestinal system: Abdomen is nondistended, soft and nontender. No organomegaly or masses felt. Normal bowel sounds heard. Central nervous system: Alert and oriented to person. No focal neurological deficits. Extremities: No edema. No calf tenderness Skin: No cyanosis. No rashes Psychiatry: anxious,    Data Reviewed: I have personally reviewed following labs and imaging studies  CBC: Recent Labs  Lab 12/24/17 2223 12/27/17 0119  WBC 7.3 9.8  NEUTROABS  --  8.1*  HGB 15.3 15.2  HCT 47.8 43.6  MCV 91.6 90.5  PLT 163 324   Basic Metabolic Panel: Recent Labs  Lab 12/24/17 2223 12/27/17 0112  NA 137 139  K 3.8 4.0  CL 97* 96*  CO2 27 32  GLUCOSE 102* 117*  BUN 14 17  CREATININE 1.15 1.16  CALCIUM 9.8 10.3   GFR: Estimated Creatinine Clearance: 68.2 mL/min (by C-G formula based on SCr of 1.16 mg/dL). Liver Function Tests: Recent Labs  Lab 12/27/17 0112  AST 21  ALT 12  ALKPHOS 67  BILITOT 1.3*  PROT 8.0  ALBUMIN 4.0   No results for input(s): LIPASE, AMYLASE in the last 168 hours. No results for input(s): AMMONIA in the last 168 hours. Coagulation Profile: Recent Labs  Lab 12/27/17 0112 12/28/17 0545 12/29/17 0548  INR 3.87 2.97 2.16   Cardiac Enzymes: Recent Labs  Lab 12/27/17 0112  CKTOTAL 202   BNP (last 3 results) No results for input(s): PROBNP in the last 8760 hours. HbA1C: No results for input(s): HGBA1C in the last 72 hours. CBG: Recent Labs  Lab 12/28/17 1133 12/28/17 1641 12/28/17 2034 12/29/17 0733 12/29/17 1111  GLUCAP 174* 139* 136* 151* 151*   Lipid Profile: No results for input(s): CHOL, HDL, LDLCALC, TRIG, CHOLHDL, LDLDIRECT in the last 72 hours. Thyroid Function Tests: Recent Labs    12/29/17 1123  TSH 2.253  FREET4 1.11   Anemia Panel: Recent Labs    12/29/17 1123  VITAMINB12 310  FOLATE 9.8   Sepsis Labs: Recent Labs  Lab 12/27/17 0125  12/27/17 0323  LATICACIDVEN 1.77 1.03    Recent Results (from the past 240 hour(s))  Culture, blood (Routine x 2)     Status: None (Preliminary result)   Collection Time: 12/27/17  1:12 AM  Result Value Ref Range Status   Specimen Description   Final    BLOOD LEFT ANTECUBITAL Performed at [facility] [address] [place], [place] [address]    Special Requests   Final    BOTTLES DRAWN AEROBIC AND ANAEROBIC Blood Culture results may not be optimal due to an excessive volume of blood received in culture bottles Performed at [facility] [address], [place], [place] [address]    Culture   Final    NO GROWTH 2 DAYS Performed at [facility] [address] [place], [place] [address]    Report Status PENDING  Incomplete  Culture, blood (Routine x 2)     Status: None (Preliminary result)   Collection Time: 12/27/17  1:13 AM  Result Value Ref Range Status   Specimen Description  Final    BLOOD RIGHT ANTECUBITAL Performed at [facility] [address] [place], [place] [address]    Special Requests   Final    BOTTLES DRAWN AEROBIC AND ANAEROBIC Blood Culture results may not be optimal due to an excessive volume of blood received in culture bottles Performed at [facility] [address] [place], [place] [address]    Culture   Final    NO GROWTH 2 DAYS Performed at [facility] [address] [place], [place] [address]    Report Status PENDING  Incomplete  Culture, Urine     Status: None   Collection Time: 12/27/17  1:13 AM  Result Value Ref Range Status   Specimen Description   Final    URINE, CLEAN CATCH Performed at [facility], [place] [address] [place], [place] [address]    Special Requests   Final    NONE Performed at [facility], [place] [address], [place], [place] [address]    Culture   Final    NO GROWTH Performed at [facility], [place] [address], [place], [place] [address]    Report Status 12/28/2017 FINAL  Final         Radiology Studies: No results found.      Scheduled Meds: . atorvastatin  40 mg Oral q1800  . B-complex with vitamin C  1 tablet Oral Daily  . benazepril  20 mg Oral Daily  . cyanocobalamin  1,000 mcg Subcutaneous Once  . diclofenac sodium  2 g Topical BID  . diltiazem  120 mg Oral QPM  . insulin aspart  0-9 Units Subcutaneous TID WC  . mometasone-formoterol  2 puff Inhalation BID  . multivitamin with minerals  1 tablet Oral QPM  . potassium chloride SA  20 mEq Oral QHS  . warfarin  7.5 mg Oral ONCE-1800  . Warfarin - Pharmacist Dosing Inpatient   Does not apply q1800   Continuous Infusions:   LOS: 0 days     Berle Mull, MD [facility] 12/29/2017, 3:17 PM  If 7PM-7AM, please contact night-coverage [email or web address] 12/29/2017, 3:17 PM

## 2017-12-30 DIAGNOSIS — Z7901 Long term (current) use of anticoagulants: Secondary | ICD-10-CM | POA: Diagnosis not present

## 2017-12-30 DIAGNOSIS — I480 Paroxysmal atrial fibrillation: Secondary | ICD-10-CM | POA: Diagnosis not present

## 2017-12-30 DIAGNOSIS — R001 Bradycardia, unspecified: Secondary | ICD-10-CM | POA: Diagnosis not present

## 2017-12-30 DIAGNOSIS — E1169 Type 2 diabetes mellitus with other specified complication: Secondary | ICD-10-CM | POA: Diagnosis not present

## 2017-12-30 DIAGNOSIS — R509 Fever, unspecified: Secondary | ICD-10-CM | POA: Diagnosis not present

## 2017-12-30 DIAGNOSIS — Z7401 Bed confinement status: Secondary | ICD-10-CM | POA: Diagnosis not present

## 2017-12-30 DIAGNOSIS — E119 Type 2 diabetes mellitus without complications: Secondary | ICD-10-CM | POA: Diagnosis not present

## 2017-12-30 DIAGNOSIS — I482 Chronic atrial fibrillation: Secondary | ICD-10-CM | POA: Diagnosis not present

## 2017-12-30 DIAGNOSIS — R531 Weakness: Secondary | ICD-10-CM | POA: Diagnosis not present

## 2017-12-30 DIAGNOSIS — I5032 Chronic diastolic (congestive) heart failure: Secondary | ICD-10-CM | POA: Diagnosis not present

## 2017-12-30 DIAGNOSIS — I110 Hypertensive heart disease with heart failure: Secondary | ICD-10-CM | POA: Diagnosis not present

## 2017-12-30 DIAGNOSIS — R278 Other lack of coordination: Secondary | ICD-10-CM | POA: Diagnosis not present

## 2017-12-30 DIAGNOSIS — J449 Chronic obstructive pulmonary disease, unspecified: Secondary | ICD-10-CM | POA: Diagnosis not present

## 2017-12-30 DIAGNOSIS — I4891 Unspecified atrial fibrillation: Secondary | ICD-10-CM | POA: Diagnosis not present

## 2017-12-30 DIAGNOSIS — R410 Disorientation, unspecified: Secondary | ICD-10-CM | POA: Diagnosis not present

## 2017-12-30 DIAGNOSIS — M2550 Pain in unspecified joint: Secondary | ICD-10-CM | POA: Diagnosis not present

## 2017-12-30 DIAGNOSIS — R41841 Cognitive communication deficit: Secondary | ICD-10-CM | POA: Diagnosis not present

## 2017-12-30 DIAGNOSIS — M6281 Muscle weakness (generalized): Secondary | ICD-10-CM | POA: Diagnosis not present

## 2017-12-30 DIAGNOSIS — F0390 Unspecified dementia without behavioral disturbance: Secondary | ICD-10-CM | POA: Diagnosis not present

## 2017-12-30 DIAGNOSIS — F321 Major depressive disorder, single episode, moderate: Secondary | ICD-10-CM | POA: Diagnosis not present

## 2017-12-30 DIAGNOSIS — G9341 Metabolic encephalopathy: Secondary | ICD-10-CM | POA: Diagnosis not present

## 2017-12-30 DIAGNOSIS — E1140 Type 2 diabetes mellitus with diabetic neuropathy, unspecified: Secondary | ICD-10-CM | POA: Diagnosis not present

## 2017-12-30 DIAGNOSIS — E785 Hyperlipidemia, unspecified: Secondary | ICD-10-CM | POA: Diagnosis not present

## 2017-12-30 DIAGNOSIS — E118 Type 2 diabetes mellitus with unspecified complications: Secondary | ICD-10-CM | POA: Diagnosis not present

## 2017-12-30 DIAGNOSIS — I10 Essential (primary) hypertension: Secondary | ICD-10-CM | POA: Diagnosis not present

## 2017-12-30 DIAGNOSIS — R2689 Other abnormalities of gait and mobility: Secondary | ICD-10-CM | POA: Diagnosis not present

## 2017-12-30 DIAGNOSIS — Z953 Presence of xenogenic heart valve: Secondary | ICD-10-CM | POA: Diagnosis not present

## 2017-12-30 LAB — BASIC METABOLIC PANEL
Anion gap: 11 (ref 5–15)
BUN: 16 mg/dL (ref 8–23)
CO2: 32 mmol/L (ref 22–32)
Calcium: 10.2 mg/dL (ref 8.9–10.3)
Chloride: 102 mmol/L (ref 98–111)
Creatinine, Ser: 0.94 mg/dL (ref 0.61–1.24)
GFR calc Af Amer: >60 mL/min (ref >60)
GFR calc non Af Amer: >60 mL/min (ref >60)
Glucose, Bld: 138 mg/dL — ABNORMAL HIGH (ref 70–99)
Potassium: 3.9 mmol/L (ref 3.5–5.1)
Sodium: 145 mmol/L (ref 135–145)

## 2017-12-30 LAB — GLUCOSE, CAPILLARY
Glucose-Capillary: 128 mg/dL — ABNORMAL HIGH (ref 70–99)
Glucose-Capillary: 193 mg/dL — ABNORMAL HIGH (ref 70–99)

## 2017-12-30 LAB — PROTIME-INR
INR: 1.93
Prothrombin Time: 21.9 seconds — ABNORMAL HIGH (ref 11.4–15.2)

## 2017-12-30 LAB — MAGNESIUM: Magnesium: 2.2 mg/dL (ref 1.7–2.4)

## 2017-12-30 MED ORDER — FUROSEMIDE 40 MG PO TABS: 40.0000 mg | ORAL_TABLET | Freq: Every day | ORAL | 0 refills | Status: DC

## 2017-12-30 MED ORDER — B COMPLEX-C PO TABS: 1.0000 | ORAL_TABLET | Freq: Every day | ORAL | 0 refills | Status: DC

## 2017-12-30 MED ORDER — WARFARIN SODIUM 5 MG PO TABS: 5.0000 mg | ORAL_TABLET | Freq: Once | ORAL | Status: DC

## 2017-12-30 NOTE — Discharge Summary (Signed)
[facility] Discharge Summary   Patient: Matthew Edwards ZDG:[id number]   PCP: Donnamae Jude, MD DOB: 1941-02-26   Date of admission: 12/27/2017   Date of discharge:  12/30/2017    Discharge Diagnoses:  Principal Problem:   Fever Active Problems:   Essential hypertension   Chronic atrial fibrillation (HCC)   Chronic anticoagulation   Type 2 diabetes mellitus with complication, without long-term current use of insulin (HCC)   Delirium   Chronic diastolic CHF (congestive heart failure) ([place])   Admitted From: home Disposition:  SNF  Recommendations for Outpatient Follow-up:  1. Please follow up with PCP in 1 week  2. May need to adjust lasix back to BID in 1 week  Follow-up Information    Donnamae Jude, MD. Schedule an appointment as soon as possible for a visit in 1 week(s).   Specialty:  Obstetrics and Gynecology Contact information: [address] [place] [address] [phone]          Diet recommendation: cardiac carb modified diet   Activity: The patient is advised to gradually reintroduce usual activities.  Discharge Condition: good  Code Status: full code  History of present illness: As per the H and P dictated on admission, " Matthew Edwards is a 77 y.o. male with medical history significant of dementia, AS s/p AVR with bioprosthetic, a.fib rate controlled on coumadin, CAD, OSA doesn't tolerate CPAP, DM2, HTN, diastolic CHF.  Patient presents to the ED with family c/o AMS that has progressively worsened over the past couple of days.  He was reportedly found on floor at home PTA, unsure if he hit head.  He normally ambulates un-assisted, but has been too weak to stand."  Hospital Course:  Summary of his active problems in the hospital is as following. Dementia Acute metabolic encephalopathy secondary to delirium Polypharmacy? Relative B12 deficiency No LP performed on admission.  Febrile on admission, no recurrent fever On the day of  discharge pt AAOx3, significantly better then admission  Suspect combination of pain meds and hypotension.   Patient lives alone at home is independent for his medication as well as for his daily ADL. CT head unremarkable. Metabolic work-up shows low normal B12 level. Provide subcu B12 x1 followed by daily B12 supplementation. Also given B1. No evidence of abscess on CT abdomen/pelvis. Blood culture no growth to date. Urine culture negative. Was started on oxycodone on admission which I will discontinue.  Chronic diastolic heart failure Essential hypertension Hypotension Sinus bradycardia Chronic A. fib. Blood pressure running low normal heart rate in 50s. Last EF of 50-55%. On Lasix and potassium -Continue benazepril and Cardizem -Hold metoprolol - reduced lasix to daily, may need to uptitrate it again in 1 week.  Also continue warfarin.  Diabetes mellitus, type 2 On glipizide as an outpatient  Chronic fecal incontinence Poor p.o. intake. On imodium as an outpatient Had diarrhea before admission but now better  Improving oral intake.   CT abdomen pelvis unremarkable for any acute abnormality. -Restart imodium as needed  COPD -Continue Dulera  Generalized weakness. Likely combination of hypotension and diarrhea with poor p.o. intake and malnutrition deficiencies. Patient requires 2+ assist for basic dressing and bathing.  Per his son, patient is fairly independent, lives alone at home and is responsible for his meals and medication. For now reducing lasix dose and monitor response    All other chronic medical condition were stable during the hospitalization.  Patient was een by physical therapy, who recommended SNF,  which was arranged by Education officer, museum and case Freight forwarder. On the day of the discharge the patient's vitals were stable , and no other acute medical condition were reported by patient. the patient was felt safe to be discharge at SNF with  therapt.  Consultants: none Procedures: none  DISCHARGE MEDICATION: Allergies as of 12/30/2017      Reactions   Pineapple Concentrate Nausea And Vomiting      Medication List    STOP taking these medications   metoprolol succinate 25 MG 24 hr tablet Commonly known as:  TOPROL-XL   oxyCODONE 5 MG immediate release tablet Commonly known as:  ROXICODONE     TAKE these medications   acetaminophen 325 MG tablet Commonly known as:  TYLENOL Take 650 mg by mouth every 6 (six) hours as needed for mild pain or moderate pain.   atorvastatin 40 MG tablet Commonly known as:  LIPITOR TAKE 1 TABLET DAILY AT 6 PM What changed:    how much to take  how to take this  when to take this  additional instructions   B-complex with vitamin C tablet Take 1 tablet by mouth daily.   benazepril 20 MG tablet Commonly known as:  LOTENSIN Take 1 tablet (20 mg total) by mouth daily.   budesonide-formoterol 80-4.5 MCG/ACT inhaler Commonly known as:  SYMBICORT Inhale 2 puffs into the lungs 2 (two) times daily.   diclofenac sodium 1 % Gel Commonly known as:  VOLTAREN Apply 2 g topically 2 (two) times daily.   diltiazem 120 MG 24 hr capsule Commonly known as:  CARTIA XT Take 1 capsule (120 mg total) by mouth daily. What changed:  when to take this   furosemide 40 MG tablet Commonly known as:  LASIX Take 1 tablet (40 mg total) by mouth daily. What changed:  when to take this   glipiZIDE 10 MG tablet Commonly known as:  GLUCOTROL TAKE 1 TABLET BY MOUTH AT 5:00 PM WITH A MEAL. What changed:    how much to take  how to take this  when to take this  additional instructions   loperamide 2 MG capsule Commonly known as:  IMODIUM Take 4 mg by mouth daily.   multivitamin with minerals tablet Take 1 tablet by mouth every evening.   nitroGLYCERIN 0.4 MG SL tablet Commonly known as:  NITROSTAT Place 1 tablet (0.4 mg total) under the tongue every 5 (five) minutes as needed. For  chest pain   potassium chloride SA 20 MEQ tablet Commonly known as:  KLOR-CON M20 Take 1 tablet (20 mEq total) by mouth daily. What changed:  when to take this   PRESCRIPTION MEDICATION Pt has CPAP machine   vitamin C 500 MG tablet Commonly known as:  ASCORBIC ACID Take 1 tablet (500 mg total) by mouth every morning.   warfarin 5 MG tablet Commonly known as:  COUMADIN Take 1 tablet (5 mg total) by mouth daily. Take as directed What changed:    how much to take  additional instructions      Allergies  Allergen Reactions  . Pineapple Concentrate Nausea And Vomiting   Discharge Instructions    Diet - low sodium heart healthy   Complete by:  As directed    Discharge instructions   Complete by:  As directed    It is important that you read following instructions as well as go over your medication list with RN to help you understand your care after this hospitalization.  Discharge Instructions: Please follow-up  with PCP in one week  Please request your primary care physician to go over all Hospital Tests and Procedure/Radiological results at the follow up,  Please get all Hospital records sent to your PCP by signing hospital release before you go home.   Do not take more than prescribed Pain, Sleep and Anxiety Medications. You were cared for by a hospitalist during your hospital stay. If you have any questions about your discharge medications or the care you received while you were in the hospital after you are discharged, you can call the unit and ask to speak with the hospitalist on call if the hospitalist that took care of you is not available.  Once you are discharged, your primary care physician will handle any further medical issues. Please note that NO REFILLS for any discharge medications will be authorized once you are discharged, as it is imperative that you return to your primary care physician (or establish a relationship with a primary care physician if you do not  have one) for your aftercare needs so that they can reassess your need for medications and monitor your lab values. You Must read complete instructions/literature along with all the possible adverse reactions/side effects for all the Medicines you take and that have been prescribed to you. Take any new Medicines after you have completely understood and accept all the possible adverse reactions/side effects. Wear Seat belts while driving. If you have smoked or chewed Tobacco in the last 2 yrs please stop smoking and/or stop any Recreational drug use.   Increase activity slowly   Complete by:  As directed      Discharge Exam: Filed Weights   12/27/17 0015 12/27/17 0020  Weight: 109.8 kg (242 lb) 109.8 kg (242 lb)   Vitals:   12/30/17 0755 12/30/17 0852  BP:  120/86  Pulse:    Resp:    Temp:    SpO2: 94%    General: Appear in no distress, no Rash; Oral Mucosa moist. Cardiovascular: S1 and S2 Present, no Murmur, no JVD Respiratory: Bilateral Air entry present and Clear to Auscultation, no Crackles, n wheezes Abdomen: Bowel Sound present, Soft and no tenderness Extremities: no Pedal edema, no calf tenderness Neurology: Grossly no focal neuro deficit.  The results of significant diagnostics from this hospitalization (including imaging, microbiology, ancillary and laboratory) are listed below for reference.    Significant Diagnostic Studies: Dg Chest 2 View  Result Date: 12/27/2017 CLINICAL DATA:  Altered mental status.  Low grade fever. EXAM: CHEST - 2 VIEW COMPARISON:  Radiographs 3 days ago 12/24/2017 FINDINGS: Post median sternotomy with prosthetic aortic valve. Unchanged cardiomegaly and mediastinal contours. Mild vascular congestion with fluid in the fissures, increased from prior exam. Mild left basilar atelectasis without confluent airspace disease. No pleural effusion or pneumothorax. No acute osseous abnormalities. IMPRESSION: 1. Mild vascular congestion and fluid in the fissures,  increased from prior exam suggesting mild fluid overload. Stable cardiomegaly. 2. Left basilar atelectasis without confluent airspace disease. Electronically Signed   By: Jeb Levering M.D.   On: 12/27/2017 02:06   Dg Chest 2 View  Result Date: 12/24/2017 CLINICAL DATA:  Shortness of breath EXAM: CHEST - 2 VIEW COMPARISON:  05/26/2016 FINDINGS: Postoperative changes in the mediastinum. Shallow inspiration. Borderline heart size. Lungs are clear. No blunting of costophrenic angles. No pneumothorax. Calcification of the aorta. IMPRESSION: No active disease.  Aortic atherosclerosis. Electronically Signed   By: Lucienne Capers M.D.   On: 12/24/2017 23:11   Ct Head Wo Contrast  Result Date: 12/27/2017 CLINICAL DATA:  Altered mental status, fever. Found down at home. On blood thinners. History of hypertension, diabetes and dementia. EXAM: CT HEAD WITHOUT CONTRAST TECHNIQUE: Contiguous axial images were obtained from the base of the skull through the vertex without intravenous contrast. COMPARISON:  MRI of the head March 12, 2017 FINDINGS: BRAIN: No intraparenchymal hemorrhage, mass effect nor midline shift. Moderate ventriculomegaly. No hydrocephalus. Patchy to confluent supratentorial white matter hypodensities. No acute large vascular territory infarcts. No abnormal extra-axial fluid collections. Basal cisterns are patent. VASCULAR: Mild calcific atherosclerosis of the carotid siphons. SKULL: No skull fracture. No significant scalp soft tissue swelling. SINUSES/ORBITS: The mastoid air-cells and included paranasal sinuses are well-aerated.The included ocular globes and orbital contents are non-suspicious. OTHER: None. IMPRESSION: 1. No acute intracranial process. 2. Moderate parenchymal brain volume loss. 3. Moderate to severe chronic small vessel ischemic changes. Electronically Signed   By: Elon Alas M.D.   On: 12/27/2017 01:44   Ct Abdomen Pelvis W Contrast  Result Date: 12/27/2017 CLINICAL  DATA:  Fever. Back pain with ambulation. Question psoas abscess. EXAM: CT ABDOMEN AND PELVIS WITH CONTRAST TECHNIQUE: Multidetector CT imaging of the abdomen and pelvis was performed using the standard protocol following bolus administration of intravenous contrast. CONTRAST:  126mL ISOVUE-300 IOPAMIDOL (ISOVUE-300) INJECTION 61% COMPARISON:  None. FINDINGS: Lower chest: Mild cardiomegaly with coronary artery calcifications. Mitral annulus calcifications. Hepatobiliary: Borderline hepatic steatosis. No discrete focal lesion. Scattered hepatic granulomas. Gallbladder physiologically distended, no calcified stone. No biliary dilatation. Pancreas: Mild fatty atrophy.  No ductal dilatation or inflammation. Spleen: Normal in size without focal abnormality. Adrenals/Urinary Tract: No adrenal nodule. No hydronephrosis or perinephric edema. Homogeneous renal enhancement with symmetric excretion on delayed phase imaging. Nonobstructing punctate stone in the mid right kidney. Small cyst in the lower right kidney. Urinary bladder is physiologically distended without wall thickening. Stomach/Bowel: Stomach is within normal limits. Appendix appears normal. No evidence of bowel wall thickening, distention, or inflammatory changes. Moderate stool throughout the colon. Vascular/Lymphatic: Moderate aortic and branch atherosclerosis. No aneurysm. No enlarged lymph nodes in the abdomen or pelvis. Reproductive: Normal sized prostate gland with central calcifications. Other: No free air, free fluid, or intra-abdominal fluid collection. Musculoskeletal: No psoas fluid collection or abscess. No evidence of paraspinal abscess. Multilevel degenerative change in the lumbar spine. No inflammatory changes or findings to suggest discitis osteomyelitis. Partial ankylosis of the sacroiliac joints may be degenerative or related to inflammatory arthropathy. IMPRESSION: 1. No acute findings in the abdomen/pelvis. Particularly, no evidence of psoas  abscess. 2. Incidental findings of nonobstructing right renal stone, borderline hepatic steatosis and Aortic Atherosclerosis (ICD10-I70.0). Coronary artery calcifications noted in the included lower chest. Electronically Signed   By: Jeb Levering M.D.   On: 12/27/2017 06:50    Microbiology: Recent Results (from the past 240 hour(s))  Culture, blood (Routine x 2)     Status: None (Preliminary result)   Collection Time: 12/27/17  1:12 AM  Result Value Ref Range Status   Specimen Description   Final    BLOOD LEFT ANTECUBITAL Performed at [facility] [address] [place], [place] [address]    Special Requests   Final    BOTTLES DRAWN AEROBIC AND ANAEROBIC Blood Culture results may not be optimal due to an excessive volume of blood received in culture bottles Performed at [facility] [address], [place], [place] [address]    Culture   Final    NO GROWTH 2 DAYS Performed at  [facility], [place] [address], [place], [place] [address]    Report Status PENDING  Incomplete  Culture, blood (Routine x 2)     Status: None (Preliminary result)   Collection Time: 12/27/17  1:13 AM  Result Value Ref Range Status   Specimen Description   Final    BLOOD RIGHT ANTECUBITAL Performed at [facility] [address], [place], [place] [address]    Special Requests   Final    BOTTLES DRAWN AEROBIC AND ANAEROBIC Blood Culture results may not be optimal due to an excessive volume of blood received in culture bottles Performed at [facility] [address], [place], [place] [address]    Culture   Final    NO GROWTH 2 DAYS Performed at [facility] [address], [place], [place] [address]    Report Status PENDING  Incomplete  Culture, Urine     Status: None   Collection Time: 12/27/17  1:13 AM  Result Value Ref Range Status   Specimen Description   Final    URINE, CLEAN CATCH Performed at [facility], [place] [address], [place], [place] [address]    Special Requests   Final    NONE Performed at [facility], [place] [address] [place], [place] [address]    Culture   Final    NO GROWTH Performed at [facility], [place] [address] [place], [place] [address]    Report Status 12/28/2017 FINAL  Final     Labs: CBC: Recent Labs  Lab 12/24/17 2223 12/27/17 0119  WBC 7.3 9.8  NEUTROABS  --  8.1*  HGB 15.3 15.2  HCT 47.8 43.6  MCV 91.6 90.5  PLT 163 240   Basic Metabolic Panel: Recent Labs  Lab 12/24/17 2223 12/27/17 0112 12/29/17 1551 12/30/17 0550  NA 137 139 136 145  K 3.8 4.0 4.0 3.9  CL 97* 96* 96* 102  CO2 27 32 30 32  GLUCOSE 102* 117* 148* 138*  BUN 14 17 21 16   CREATININE 1.15 1.16 1.03 0.94  CALCIUM 9.8 10.3 10.3 10.2  MG  --   --   --  2.2   Liver Function Tests: Recent Labs  Lab 12/27/17 0112  AST 21  ALT 12  ALKPHOS 67  BILITOT 1.3*  PROT 8.0  ALBUMIN 4.0   No results for input(s): LIPASE, AMYLASE in the last 168 hours. No results for input(s): AMMONIA in the last 168 hours. Cardiac Enzymes: Recent Labs  Lab 12/27/17 0112  CKTOTAL 202   BNP (last 3 results) No results for input(s): BNP in the last 8760 hours. CBG: Recent Labs  Lab 12/29/17 0733 12/29/17 1111 12/29/17 1642 12/29/17 2055 12/30/17 0745  GLUCAP 151* 151* 139* 142* 128*   Time spent: 35 minutes  Signed:  Berle Mull  [facility]  12/30/2017  , 11:40 AM

## 2017-12-30 NOTE — Clinical Social Work Placement (Addendum)
    Patient and family has bed at [facility].  LCSW confirmed bed with facility. Patient will go to room 217.  LCSW notified family of tranport.   Patient will tranport by PTAR.   LCSW faxed dc docs to facility.   RN report number [phone]  Patient can dc after he has lunch.  BKJ    CLINICAL SOCIAL WORK PLACEMENT  NOTE  Date:  12/30/2017  Patient Details  Name: Matthew Edwards MRN: [id number] Date of Birth: 1941-02-10  Clinical Social Work is seeking post-discharge placement for this patient at the [place] level of care (*CSW will initial, date and re-position this form in  chart as items are completed):  Yes   Patient/family provided with [facility] Work Department's list of facilities offering this level of care within the geographic area requested by the patient (or if unable, by the patient's family).  Yes   Patient/family informed of their freedom to choose among providers that offer the needed level of care, that participate in Medicare, Medicaid or managed care program needed by the patient, have an available bed and are willing to accept the patient.  Yes   Patient/family informed of [facility]'s ownership interest in [facility] and [facility], as well as of the fact that they are under no obligation to receive care at these facilities.  PASRR submitted to EDS on       PASRR number received on 12/28/17     Existing PASRR number confirmed on       FL2 transmitted to all facilities in geographic area requested by pt/family on 12/28/17     FL2 transmitted to all facilities within larger geographic area on       Patient informed that his/her managed care company has contracts with or will negotiate with certain facilities, including the following:        Yes   Patient/family informed of bed offers received.  Patient chooses bed at [facility]     Physician recommends and patient chooses bed at       Patient to be transferred to [facility] on 12/30/17.  Patient to be transferred to facility by EMS     Patient family notified on 12/30/17 of transfer.  Name of family member notified:  [name]     PHYSICIAN Please prepare priority discharge summary, including medications     Additional Comment:    _______________________________________________ Servando Snare, LCSW 12/30/2017, 9:51 AM

## 2017-12-30 NOTE — Progress Notes (Signed)
[facility] for Warfarin Indication: atrial fibrillation  Allergies  Allergen Reactions  . Pineapple Concentrate Nausea And Vomiting    Patient Measurements: Height: 5\' 9"  (175.3 cm) Weight: 242 lb (109.8 kg) IBW/kg (Calculated) : 70.7   Vital Signs: Temp: 97.6 F (36.4 C) (08/01 0449) Temp Source: Oral (08/01 0449) BP: 120/86 (08/01 0852) Pulse Rate: 50 (08/01 0449)  Labs: Recent Labs    12/28/17 0545 12/29/17 0548 12/29/17 1551 12/30/17 0550  LABPROT 30.7* 23.9*  --  21.9*  INR 2.97 2.16  --  1.93  CREATININE  --   --  1.03 0.94    Estimated Creatinine Clearance: 84.2 mL/min (by C-G formula based on SCr of 0.94 mg/dL).   Medical History: Past Medical History:  Diagnosis Date  . Arthritis   . Atrial fibrillation ([place])   . CAD (coronary artery disease) 2012   [place]-LAD 2 or 3 srents  . Complication of anesthesia    "loopy after polpy removal dec 2017, lasted several days"  . COPD (chronic obstructive pulmonary disease) ([place])   . Dementia   . GERD (gastroesophageal reflux disease)   . HEART FAILURE, CONGESTIVE UNSPEC 02/23/2007   Qualifier: Diagnosis of  By: Mellody Drown MD, [facility]    . Hyperlipidemia   . Hypertension   . OSA (obstructive sleep apnea) 11/13/2014  . S/P AVR    #25 mm Edwards pericardial valve Bioprosthetic. Dr Cyndia Bent.  . Situational depression    "son passed 11/2013"  . Sleep apnea    occ uses c pap does not know settings   . Stented coronary artery 2013  . TOBACCO DEPENDENCE 07/29/2006   Qualifier: History of  By: Carlena Sax  MD, [facility]    . Type II diabetes mellitus ([place])     Assessment: 59 yoM admitted with altered mental status. PMH includes AS s/p bioprosthetic AVR, a-fib on chronic warfarin therapy. Pharmacy asked to manage warfarin dosing while patient in the hospital. PTA dose reported as 7.5mg  on Tues/Fri, 5mg  on all other days of the week. Last dose reported as 7/27. INR= 3.87 on admission. CBC WNL.  CT of head: No acute intracranial process.   Today, 12/30/17:  INR 1.9    No major DDIs  No bleeding issues noted  Goal of Therapy:  INR 2-3    Plan:   Warfarin 5 mg PO x 1 today   Daily PT/INR  Monitor closely for s/sx of bleeding   Lenis Noon, PharmD 12/30/17 3:09 PM

## 2017-12-30 NOTE — Progress Notes (Signed)
Physical Therapy Treatment Patient Details Name: Matthew Edwards MRN: [id number] DOB: November 13, 1940 Today's Date: 12/30/2017    History of Present Illness 77yo male brought to the ED by family due to increased confusion, also due to being found on the floor at home. PMH A-fib, COPD, dementia, HTN, s/p AVR, DM, hx CABG     PT Comments    Assisted out of recliner to amb in hallway.  Very unsteady gait.  HIGH FALL Risk.  Pt will need ST Rehab at SNF.   Follow Up Recommendations  SNF     Equipment Recommendations       Recommendations for Other Services       Precautions / Restrictions Precautions Precautions: None Precaution Comments: memorie deficits Restrictions Weight Bearing Restrictions: No    Mobility  Bed Mobility         Supine to sit: Min assist     General bed mobility comments: OOB in recliner   Transfers Overall transfer level: Needs assistance Equipment used: Rolling walker (2 wheeled) Transfers: Sit to/from Stand Sit to Stand: Min guard;Min assist         General transfer comment: 75% VC's on safety and direction  Ambulation/Gait Ambulation/Gait assistance: Min guard;Min assist Gait Distance (Feet): 39 Feet Assistive device: Rolling walker (2 wheeled) Gait Pattern/deviations: Step-to pattern;Step-through pattern;Drifts right/left Gait velocity: decreased   General Gait Details: very unsteady gait    Stairs             Wheelchair Mobility    Modified Rankin (Stroke Patients Only)       Balance                                            Cognition Arousal/Alertness: Awake/alert Behavior During Therapy: Impulsive Overall Cognitive Status: No family/caregiver present to determine baseline cognitive functioning Area of Impairment: Orientation;Attention;Memory;Safety/judgement;Problem solving                               General Comments: VC's for direction and safety    Impaired cognition/memory      Exercises      General Comments        Pertinent Vitals/Pain Pain Assessment: No/denies pain    Home Living                      Prior Function            PT Goals (current goals can now be found in the care plan section) Progress towards PT goals: Progressing toward goals    Frequency    Min 2X/week      PT Plan Current plan remains appropriate    Co-evaluation              AM-PAC PT "6 Clicks" Daily Activity  Outcome Measure  Difficulty turning over in bed (including adjusting bedclothes, sheets and blankets)?: A Lot Difficulty moving from lying on back to sitting on the side of the bed? : A Lot Difficulty sitting down on and standing up from a chair with arms (e.g., wheelchair, bedside commode, etc,.)?: A Lot Help needed moving to and from a bed to chair (including a wheelchair)?: A Lot Help needed walking in hospital room?: A Lot Help needed climbing 3-5 steps with a railing? : Total 6 Click Score: 11  End of Session Equipment Utilized During Treatment: Gait belt Activity Tolerance: Patient tolerated treatment well Patient left: in chair;with chair alarm set;with call bell/phone within reach Nurse Communication: Mobility status PT Visit Diagnosis: Muscle weakness (generalized) (M62.81);History of falling (Z91.81);Difficulty in walking, not elsewhere classified (R26.2)     Time: 9539-6728 PT Time Calculation (min) (ACUTE ONLY): 19 min  Charges:  $Gait Training: 8-22 mins                     Rica Koyanagi  PTA [facility]  Acute  Rehab Pager      [phone]

## 2017-12-30 NOTE — Progress Notes (Signed)
Report called to Santiago Glad at [facility].

## 2017-12-30 NOTE — Progress Notes (Signed)
Occupational Therapy Treatment Patient Details Name: Matthew Edwards MRN: [id number] DOB: April 25, 1941 Today's Date: 12/30/2017    History of present illness 77yo male brought to the ED by family due to increased confusion, also due to being found on the floor at home. PMH A-fib, COPD, dementia, HTN, s/p AVR, DM, hx CABG    OT comments  Improved from yesterday. Continue to recommend SNF for rehab  Follow Up Recommendations  SNF    Equipment Recommendations  3 in 1 bedside commode    Recommendations for Other Services      Precautions / Restrictions Precautions Precautions: Fall Restrictions Weight Bearing Restrictions: No       Mobility Bed Mobility         Supine to sit: Min assist        Transfers   Equipment used: Rolling walker (2 wheeled)   Sit to Stand: Min assist         General transfer comment: Light assist to rise and stabilize. Tends to over shift forward.  Cues for safety with RW    Balance                                           ADL either performed or assessed with clinical judgement   ADL       Grooming: Oral care;Supervision/safety;Sitting;Set up                   Toilet Transfer: Minimal assistance;Stand-pivot;RW(chair)             General ADL Comments: pt brushed teeth twice at his request. First time, he didn't need cues; second time he needed cues to rinse mouth again.     Vision       Perception     Praxis      Cognition Arousal/Alertness: Awake/alert Behavior During Therapy: Impulsive Overall Cognitive Status: No family/caregiver present to determine baseline cognitive functioning                                 General Comments: cues for sequencing during toothbrushing.  Initiation better today. Cues for safety with RW        Exercises     Shoulder Instructions       General Comments      Pertinent Vitals/ Pain       Pain Assessment: No/denies pain  Home  Living                                          Prior Functioning/Environment              Frequency  Min 2X/week        Progress Toward Goals  OT Goals(current goals can now be found in the care plan section)  Progress towards OT goals: Progressing toward goals     Plan      Co-evaluation                 AM-PAC PT "6 Clicks" Daily Activity     Outcome Measure   Help from another person eating meals?: A Little Help from another person taking care of personal grooming?: A Little Help from another person toileting, which includes using  toliet, bedpan, or urinal?: A Lot Help from another person bathing (including washing, rinsing, drying)?: A Lot Help from another person to put on and taking off regular upper body clothing?: A Little Help from another person to put on and taking off regular lower body clothing?: A Lot 6 Click Score: 15    End of Session    OT Visit Diagnosis: Muscle weakness (generalized) (M62.81);Unsteadiness on feet (R26.81)   Activity Tolerance Patient tolerated treatment well   Patient Left in chair;with call bell/phone within reach;with chair alarm set;with nursing/sitter in room   Nurse Communication          Time: 1610-9604 OT Time Calculation (min): 19 min  Charges: OT General Charges $OT Visit: 1 Visit OT Treatments $Self Care/Home Management : 8-22 mins  Matthew Edwards, Matthew Edwards [phone] 12/30/2017   Matthew Edwards 12/30/2017, 11:28 AM

## 2017-12-30 NOTE — Progress Notes (Signed)
Patient has bed at [facility] and insurance authorization.   LCSW paged attending with update.   Carolin [place]

## 2017-12-31 DIAGNOSIS — R531 Weakness: Secondary | ICD-10-CM | POA: Diagnosis not present

## 2017-12-31 DIAGNOSIS — I10 Essential (primary) hypertension: Secondary | ICD-10-CM | POA: Diagnosis not present

## 2017-12-31 DIAGNOSIS — I5032 Chronic diastolic (congestive) heart failure: Secondary | ICD-10-CM | POA: Diagnosis not present

## 2017-12-31 DIAGNOSIS — I482 Chronic atrial fibrillation: Secondary | ICD-10-CM | POA: Diagnosis not present

## 2018-01-01 LAB — CULTURE, BLOOD (ROUTINE X 2)
Culture: NO GROWTH
Culture: NO GROWTH

## 2018-01-05 DIAGNOSIS — E785 Hyperlipidemia, unspecified: Secondary | ICD-10-CM | POA: Diagnosis not present

## 2018-01-05 DIAGNOSIS — I4891 Unspecified atrial fibrillation: Secondary | ICD-10-CM | POA: Diagnosis not present

## 2018-01-05 DIAGNOSIS — E1169 Type 2 diabetes mellitus with other specified complication: Secondary | ICD-10-CM | POA: Diagnosis not present

## 2018-01-05 DIAGNOSIS — R001 Bradycardia, unspecified: Secondary | ICD-10-CM | POA: Diagnosis not present

## 2018-01-05 DIAGNOSIS — I10 Essential (primary) hypertension: Secondary | ICD-10-CM | POA: Diagnosis not present

## 2018-01-05 DIAGNOSIS — G9341 Metabolic encephalopathy: Secondary | ICD-10-CM | POA: Diagnosis not present

## 2018-01-07 DIAGNOSIS — I10 Essential (primary) hypertension: Secondary | ICD-10-CM | POA: Diagnosis not present

## 2018-01-07 DIAGNOSIS — I5032 Chronic diastolic (congestive) heart failure: Secondary | ICD-10-CM | POA: Diagnosis not present

## 2018-01-07 DIAGNOSIS — E119 Type 2 diabetes mellitus without complications: Secondary | ICD-10-CM | POA: Diagnosis not present

## 2018-01-07 DIAGNOSIS — J449 Chronic obstructive pulmonary disease, unspecified: Secondary | ICD-10-CM | POA: Diagnosis not present

## 2018-01-11 ENCOUNTER — Telehealth: Payer: Self-pay

## 2018-01-11 NOTE — Telephone Encounter (Signed)
Verbal order given to [facility]

## 2018-01-11 NOTE — Telephone Encounter (Signed)
Matthew Edwards from [facility] called and they have a referral because patient being discharged from rehab.  Needs verbal orders for nursing, PT, OT, ST and aide.  Tanya [phone]  Danley Danker, RN [facility] RN)

## 2018-01-17 DIAGNOSIS — I272 Pulmonary hypertension, unspecified: Secondary | ICD-10-CM | POA: Diagnosis not present

## 2018-01-17 DIAGNOSIS — I110 Hypertensive heart disease with heart failure: Secondary | ICD-10-CM | POA: Diagnosis not present

## 2018-01-17 DIAGNOSIS — F329 Major depressive disorder, single episode, unspecified: Secondary | ICD-10-CM | POA: Diagnosis not present

## 2018-01-17 DIAGNOSIS — J449 Chronic obstructive pulmonary disease, unspecified: Secondary | ICD-10-CM | POA: Diagnosis not present

## 2018-01-17 DIAGNOSIS — E1143 Type 2 diabetes mellitus with diabetic autonomic (poly)neuropathy: Secondary | ICD-10-CM | POA: Diagnosis not present

## 2018-01-17 DIAGNOSIS — I5032 Chronic diastolic (congestive) heart failure: Secondary | ICD-10-CM | POA: Diagnosis not present

## 2018-01-17 DIAGNOSIS — G3184 Mild cognitive impairment, so stated: Secondary | ICD-10-CM | POA: Diagnosis not present

## 2018-01-17 DIAGNOSIS — I2510 Atherosclerotic heart disease of native coronary artery without angina pectoris: Secondary | ICD-10-CM | POA: Diagnosis not present

## 2018-01-17 DIAGNOSIS — I482 Chronic atrial fibrillation: Secondary | ICD-10-CM | POA: Diagnosis not present

## 2018-01-18 ENCOUNTER — Telehealth: Payer: Self-pay

## 2018-01-18 NOTE — Telephone Encounter (Signed)
Tamika, RN with [facility], called for VO for in home nursing 1x a week for 2 weeks and 2 visits as needed. [phone].

## 2018-01-19 DIAGNOSIS — I2510 Atherosclerotic heart disease of native coronary artery without angina pectoris: Secondary | ICD-10-CM | POA: Diagnosis not present

## 2018-01-19 DIAGNOSIS — I272 Pulmonary hypertension, unspecified: Secondary | ICD-10-CM | POA: Diagnosis not present

## 2018-01-19 DIAGNOSIS — I110 Hypertensive heart disease with heart failure: Secondary | ICD-10-CM | POA: Diagnosis not present

## 2018-01-19 DIAGNOSIS — I5032 Chronic diastolic (congestive) heart failure: Secondary | ICD-10-CM | POA: Diagnosis not present

## 2018-01-19 DIAGNOSIS — E1143 Type 2 diabetes mellitus with diabetic autonomic (poly)neuropathy: Secondary | ICD-10-CM | POA: Diagnosis not present

## 2018-01-19 DIAGNOSIS — G3184 Mild cognitive impairment, so stated: Secondary | ICD-10-CM | POA: Diagnosis not present

## 2018-01-19 DIAGNOSIS — I482 Chronic atrial fibrillation: Secondary | ICD-10-CM | POA: Diagnosis not present

## 2018-01-19 DIAGNOSIS — F329 Major depressive disorder, single episode, unspecified: Secondary | ICD-10-CM | POA: Diagnosis not present

## 2018-01-19 DIAGNOSIS — J449 Chronic obstructive pulmonary disease, unspecified: Secondary | ICD-10-CM | POA: Diagnosis not present

## 2018-01-19 NOTE — Telephone Encounter (Signed)
Tamyra- PT with [facility] calling for verbal orders to see patient 2x week for 4 weeks. Call back [phone] Wallace Cullens, RN

## 2018-01-19 NOTE — Telephone Encounter (Signed)
I have taken care of this

## 2018-01-21 DIAGNOSIS — I110 Hypertensive heart disease with heart failure: Secondary | ICD-10-CM | POA: Diagnosis not present

## 2018-01-21 DIAGNOSIS — I2510 Atherosclerotic heart disease of native coronary artery without angina pectoris: Secondary | ICD-10-CM | POA: Diagnosis not present

## 2018-01-21 DIAGNOSIS — I482 Chronic atrial fibrillation: Secondary | ICD-10-CM | POA: Diagnosis not present

## 2018-01-21 DIAGNOSIS — I5032 Chronic diastolic (congestive) heart failure: Secondary | ICD-10-CM | POA: Diagnosis not present

## 2018-01-21 DIAGNOSIS — E1143 Type 2 diabetes mellitus with diabetic autonomic (poly)neuropathy: Secondary | ICD-10-CM | POA: Diagnosis not present

## 2018-01-21 DIAGNOSIS — F329 Major depressive disorder, single episode, unspecified: Secondary | ICD-10-CM | POA: Diagnosis not present

## 2018-01-21 DIAGNOSIS — I272 Pulmonary hypertension, unspecified: Secondary | ICD-10-CM | POA: Diagnosis not present

## 2018-01-21 DIAGNOSIS — G3184 Mild cognitive impairment, so stated: Secondary | ICD-10-CM | POA: Diagnosis not present

## 2018-01-21 DIAGNOSIS — J449 Chronic obstructive pulmonary disease, unspecified: Secondary | ICD-10-CM | POA: Diagnosis not present

## 2018-01-25 ENCOUNTER — Telehealth: Payer: Self-pay

## 2018-01-25 DIAGNOSIS — F329 Major depressive disorder, single episode, unspecified: Secondary | ICD-10-CM | POA: Diagnosis not present

## 2018-01-25 DIAGNOSIS — I482 Chronic atrial fibrillation: Secondary | ICD-10-CM | POA: Diagnosis not present

## 2018-01-25 DIAGNOSIS — J449 Chronic obstructive pulmonary disease, unspecified: Secondary | ICD-10-CM | POA: Diagnosis not present

## 2018-01-25 DIAGNOSIS — I5032 Chronic diastolic (congestive) heart failure: Secondary | ICD-10-CM | POA: Diagnosis not present

## 2018-01-25 DIAGNOSIS — I272 Pulmonary hypertension, unspecified: Secondary | ICD-10-CM | POA: Diagnosis not present

## 2018-01-25 DIAGNOSIS — I110 Hypertensive heart disease with heart failure: Secondary | ICD-10-CM | POA: Diagnosis not present

## 2018-01-25 DIAGNOSIS — G3184 Mild cognitive impairment, so stated: Secondary | ICD-10-CM | POA: Diagnosis not present

## 2018-01-25 DIAGNOSIS — I2510 Atherosclerotic heart disease of native coronary artery without angina pectoris: Secondary | ICD-10-CM | POA: Diagnosis not present

## 2018-01-25 DIAGNOSIS — E1143 Type 2 diabetes mellitus with diabetic autonomic (poly)neuropathy: Secondary | ICD-10-CM | POA: Diagnosis not present

## 2018-01-25 NOTE — Telephone Encounter (Signed)
Sharen Hones, nurse with [facility], called requesting VO for INR 1x week and to report back results to pcp via fax. [phone] for VO.

## 2018-01-27 DIAGNOSIS — F329 Major depressive disorder, single episode, unspecified: Secondary | ICD-10-CM | POA: Diagnosis not present

## 2018-01-27 DIAGNOSIS — I482 Chronic atrial fibrillation: Secondary | ICD-10-CM | POA: Diagnosis not present

## 2018-01-27 DIAGNOSIS — I110 Hypertensive heart disease with heart failure: Secondary | ICD-10-CM | POA: Diagnosis not present

## 2018-01-27 DIAGNOSIS — I2510 Atherosclerotic heart disease of native coronary artery without angina pectoris: Secondary | ICD-10-CM | POA: Diagnosis not present

## 2018-01-27 DIAGNOSIS — I272 Pulmonary hypertension, unspecified: Secondary | ICD-10-CM | POA: Diagnosis not present

## 2018-01-27 DIAGNOSIS — J449 Chronic obstructive pulmonary disease, unspecified: Secondary | ICD-10-CM | POA: Diagnosis not present

## 2018-01-27 DIAGNOSIS — I5032 Chronic diastolic (congestive) heart failure: Secondary | ICD-10-CM | POA: Diagnosis not present

## 2018-01-27 DIAGNOSIS — E1143 Type 2 diabetes mellitus with diabetic autonomic (poly)neuropathy: Secondary | ICD-10-CM | POA: Diagnosis not present

## 2018-01-27 DIAGNOSIS — G3184 Mild cognitive impairment, so stated: Secondary | ICD-10-CM | POA: Diagnosis not present

## 2018-01-31 DIAGNOSIS — I482 Chronic atrial fibrillation: Secondary | ICD-10-CM | POA: Diagnosis not present

## 2018-01-31 DIAGNOSIS — I5032 Chronic diastolic (congestive) heart failure: Secondary | ICD-10-CM | POA: Diagnosis not present

## 2018-01-31 DIAGNOSIS — F329 Major depressive disorder, single episode, unspecified: Secondary | ICD-10-CM | POA: Diagnosis not present

## 2018-01-31 DIAGNOSIS — E1143 Type 2 diabetes mellitus with diabetic autonomic (poly)neuropathy: Secondary | ICD-10-CM | POA: Diagnosis not present

## 2018-01-31 DIAGNOSIS — I110 Hypertensive heart disease with heart failure: Secondary | ICD-10-CM | POA: Diagnosis not present

## 2018-01-31 DIAGNOSIS — I2510 Atherosclerotic heart disease of native coronary artery without angina pectoris: Secondary | ICD-10-CM | POA: Diagnosis not present

## 2018-01-31 DIAGNOSIS — I272 Pulmonary hypertension, unspecified: Secondary | ICD-10-CM | POA: Diagnosis not present

## 2018-01-31 DIAGNOSIS — J449 Chronic obstructive pulmonary disease, unspecified: Secondary | ICD-10-CM | POA: Diagnosis not present

## 2018-01-31 DIAGNOSIS — G3184 Mild cognitive impairment, so stated: Secondary | ICD-10-CM | POA: Diagnosis not present

## 2018-02-01 ENCOUNTER — Telehealth: Payer: Self-pay

## 2018-02-01 DIAGNOSIS — F329 Major depressive disorder, single episode, unspecified: Secondary | ICD-10-CM | POA: Diagnosis not present

## 2018-02-01 DIAGNOSIS — I272 Pulmonary hypertension, unspecified: Secondary | ICD-10-CM | POA: Diagnosis not present

## 2018-02-01 DIAGNOSIS — I482 Chronic atrial fibrillation: Secondary | ICD-10-CM | POA: Diagnosis not present

## 2018-02-01 DIAGNOSIS — G3184 Mild cognitive impairment, so stated: Secondary | ICD-10-CM | POA: Diagnosis not present

## 2018-02-01 DIAGNOSIS — E1143 Type 2 diabetes mellitus with diabetic autonomic (poly)neuropathy: Secondary | ICD-10-CM | POA: Diagnosis not present

## 2018-02-01 DIAGNOSIS — I2510 Atherosclerotic heart disease of native coronary artery without angina pectoris: Secondary | ICD-10-CM | POA: Diagnosis not present

## 2018-02-01 DIAGNOSIS — I5032 Chronic diastolic (congestive) heart failure: Secondary | ICD-10-CM | POA: Diagnosis not present

## 2018-02-01 DIAGNOSIS — I110 Hypertensive heart disease with heart failure: Secondary | ICD-10-CM | POA: Diagnosis not present

## 2018-02-01 DIAGNOSIS — J449 Chronic obstructive pulmonary disease, unspecified: Secondary | ICD-10-CM | POA: Diagnosis not present

## 2018-02-01 NOTE — Telephone Encounter (Signed)
Alla Feeling, Speech Therapist with [facility], called requesting VO for speech therapy:  2x a week for 4 weeks.  Call back [phone].

## 2018-02-02 ENCOUNTER — Other Ambulatory Visit: Payer: Self-pay | Admitting: *Deleted

## 2018-02-02 DIAGNOSIS — I482 Chronic atrial fibrillation, unspecified: Secondary | ICD-10-CM

## 2018-02-02 MED ORDER — WARFARIN SODIUM 5 MG PO TABS: ORAL_TABLET | ORAL | 3 refills | Status: DC

## 2018-02-02 NOTE — Telephone Encounter (Signed)
Matthew Edwards with [facility] called with INR results for patient.  His INR today was 1.2 and she noticed when checking his medication box that stays in his room, that this pill wasn't in there.  Will forward to MD as well as lab to dose coumadin.  Jazmin Hartsell,CMA

## 2018-02-02 NOTE — Telephone Encounter (Signed)
I spoke with the home health nurse, it doesn't look like he has been taking his coumadin. He was at 1.2 yesterday and the nurse said that there were no coumadin tabs in his pill box, he may need a refill. I was unable to get in touch with the patient, nurse says she will attempt to reach him or his daughter this evening. I instructed her to have him go back on his previous known dose of 10 mg on Thursday and 7.5 mg all other days and recheck his INR in 1 week. Matthew Edwards, Kevin Fenton

## 2018-02-02 NOTE — Telephone Encounter (Signed)
Left message for home health nurse to return call. Need more information concerning INR dosing. What is his current dose? Has he missed any doses? If so, how many?

## 2018-02-03 ENCOUNTER — Other Ambulatory Visit: Payer: Self-pay | Admitting: Family Medicine

## 2018-02-03 DIAGNOSIS — G3184 Mild cognitive impairment, so stated: Secondary | ICD-10-CM | POA: Diagnosis not present

## 2018-02-03 DIAGNOSIS — I482 Chronic atrial fibrillation, unspecified: Secondary | ICD-10-CM

## 2018-02-03 DIAGNOSIS — F329 Major depressive disorder, single episode, unspecified: Secondary | ICD-10-CM | POA: Diagnosis not present

## 2018-02-03 DIAGNOSIS — J449 Chronic obstructive pulmonary disease, unspecified: Secondary | ICD-10-CM | POA: Diagnosis not present

## 2018-02-03 DIAGNOSIS — E1143 Type 2 diabetes mellitus with diabetic autonomic (poly)neuropathy: Secondary | ICD-10-CM | POA: Diagnosis not present

## 2018-02-03 DIAGNOSIS — I272 Pulmonary hypertension, unspecified: Secondary | ICD-10-CM | POA: Diagnosis not present

## 2018-02-03 DIAGNOSIS — I110 Hypertensive heart disease with heart failure: Secondary | ICD-10-CM | POA: Diagnosis not present

## 2018-02-03 DIAGNOSIS — I2510 Atherosclerotic heart disease of native coronary artery without angina pectoris: Secondary | ICD-10-CM | POA: Diagnosis not present

## 2018-02-03 DIAGNOSIS — I5032 Chronic diastolic (congestive) heart failure: Secondary | ICD-10-CM | POA: Diagnosis not present

## 2018-02-03 MED ORDER — WARFARIN SODIUM 5 MG PO TABS: ORAL_TABLET | ORAL | 3 refills | Status: DC

## 2018-02-07 DIAGNOSIS — E1143 Type 2 diabetes mellitus with diabetic autonomic (poly)neuropathy: Secondary | ICD-10-CM | POA: Diagnosis not present

## 2018-02-07 DIAGNOSIS — I482 Chronic atrial fibrillation: Secondary | ICD-10-CM | POA: Diagnosis not present

## 2018-02-07 DIAGNOSIS — I272 Pulmonary hypertension, unspecified: Secondary | ICD-10-CM | POA: Diagnosis not present

## 2018-02-07 DIAGNOSIS — G3184 Mild cognitive impairment, so stated: Secondary | ICD-10-CM | POA: Diagnosis not present

## 2018-02-07 DIAGNOSIS — I5032 Chronic diastolic (congestive) heart failure: Secondary | ICD-10-CM | POA: Diagnosis not present

## 2018-02-07 DIAGNOSIS — F329 Major depressive disorder, single episode, unspecified: Secondary | ICD-10-CM | POA: Diagnosis not present

## 2018-02-07 DIAGNOSIS — I2510 Atherosclerotic heart disease of native coronary artery without angina pectoris: Secondary | ICD-10-CM | POA: Diagnosis not present

## 2018-02-07 DIAGNOSIS — J449 Chronic obstructive pulmonary disease, unspecified: Secondary | ICD-10-CM | POA: Diagnosis not present

## 2018-02-07 DIAGNOSIS — I110 Hypertensive heart disease with heart failure: Secondary | ICD-10-CM | POA: Diagnosis not present

## 2018-02-08 DIAGNOSIS — J449 Chronic obstructive pulmonary disease, unspecified: Secondary | ICD-10-CM | POA: Diagnosis not present

## 2018-02-08 DIAGNOSIS — I272 Pulmonary hypertension, unspecified: Secondary | ICD-10-CM | POA: Diagnosis not present

## 2018-02-08 DIAGNOSIS — I5032 Chronic diastolic (congestive) heart failure: Secondary | ICD-10-CM | POA: Diagnosis not present

## 2018-02-08 DIAGNOSIS — I2510 Atherosclerotic heart disease of native coronary artery without angina pectoris: Secondary | ICD-10-CM | POA: Diagnosis not present

## 2018-02-08 DIAGNOSIS — I110 Hypertensive heart disease with heart failure: Secondary | ICD-10-CM | POA: Diagnosis not present

## 2018-02-08 DIAGNOSIS — E1143 Type 2 diabetes mellitus with diabetic autonomic (poly)neuropathy: Secondary | ICD-10-CM | POA: Diagnosis not present

## 2018-02-08 DIAGNOSIS — I482 Chronic atrial fibrillation: Secondary | ICD-10-CM | POA: Diagnosis not present

## 2018-02-08 DIAGNOSIS — F329 Major depressive disorder, single episode, unspecified: Secondary | ICD-10-CM | POA: Diagnosis not present

## 2018-02-08 DIAGNOSIS — G3184 Mild cognitive impairment, so stated: Secondary | ICD-10-CM | POA: Diagnosis not present

## 2018-02-09 ENCOUNTER — Other Ambulatory Visit: Payer: Self-pay | Admitting: Family Medicine

## 2018-02-09 DIAGNOSIS — J449 Chronic obstructive pulmonary disease, unspecified: Secondary | ICD-10-CM | POA: Diagnosis not present

## 2018-02-09 DIAGNOSIS — I110 Hypertensive heart disease with heart failure: Secondary | ICD-10-CM | POA: Diagnosis not present

## 2018-02-09 DIAGNOSIS — J42 Unspecified chronic bronchitis: Secondary | ICD-10-CM

## 2018-02-09 DIAGNOSIS — F329 Major depressive disorder, single episode, unspecified: Secondary | ICD-10-CM | POA: Diagnosis not present

## 2018-02-09 DIAGNOSIS — G3184 Mild cognitive impairment, so stated: Secondary | ICD-10-CM | POA: Diagnosis not present

## 2018-02-09 DIAGNOSIS — I482 Chronic atrial fibrillation: Secondary | ICD-10-CM | POA: Diagnosis not present

## 2018-02-09 DIAGNOSIS — I5032 Chronic diastolic (congestive) heart failure: Secondary | ICD-10-CM | POA: Diagnosis not present

## 2018-02-09 DIAGNOSIS — I272 Pulmonary hypertension, unspecified: Secondary | ICD-10-CM | POA: Diagnosis not present

## 2018-02-09 DIAGNOSIS — E1143 Type 2 diabetes mellitus with diabetic autonomic (poly)neuropathy: Secondary | ICD-10-CM | POA: Diagnosis not present

## 2018-02-09 DIAGNOSIS — I2510 Atherosclerotic heart disease of native coronary artery without angina pectoris: Secondary | ICD-10-CM | POA: Diagnosis not present

## 2018-02-09 NOTE — Telephone Encounter (Signed)
[facility] RN called stating pt needs a refill on his coumadin. RN then proceeded to state she was going to check his INR this afternoon and she will call back with results and to clarify which pharmacy this medication needs to go to.

## 2018-02-10 ENCOUNTER — Ambulatory Visit: Payer: Medicare HMO | Admitting: Family Medicine

## 2018-02-10 DIAGNOSIS — I272 Pulmonary hypertension, unspecified: Secondary | ICD-10-CM | POA: Diagnosis not present

## 2018-02-10 DIAGNOSIS — I482 Chronic atrial fibrillation: Secondary | ICD-10-CM | POA: Diagnosis not present

## 2018-02-10 DIAGNOSIS — I110 Hypertensive heart disease with heart failure: Secondary | ICD-10-CM | POA: Diagnosis not present

## 2018-02-10 DIAGNOSIS — E1143 Type 2 diabetes mellitus with diabetic autonomic (poly)neuropathy: Secondary | ICD-10-CM | POA: Diagnosis not present

## 2018-02-10 DIAGNOSIS — J449 Chronic obstructive pulmonary disease, unspecified: Secondary | ICD-10-CM | POA: Diagnosis not present

## 2018-02-10 DIAGNOSIS — I5032 Chronic diastolic (congestive) heart failure: Secondary | ICD-10-CM | POA: Diagnosis not present

## 2018-02-10 DIAGNOSIS — I2510 Atherosclerotic heart disease of native coronary artery without angina pectoris: Secondary | ICD-10-CM | POA: Diagnosis not present

## 2018-02-10 DIAGNOSIS — F329 Major depressive disorder, single episode, unspecified: Secondary | ICD-10-CM | POA: Diagnosis not present

## 2018-02-10 DIAGNOSIS — G3184 Mild cognitive impairment, so stated: Secondary | ICD-10-CM | POA: Diagnosis not present

## 2018-02-10 NOTE — Telephone Encounter (Signed)
Missed apt again. Will close the note.

## 2018-02-14 DIAGNOSIS — I2510 Atherosclerotic heart disease of native coronary artery without angina pectoris: Secondary | ICD-10-CM | POA: Diagnosis not present

## 2018-02-14 DIAGNOSIS — F329 Major depressive disorder, single episode, unspecified: Secondary | ICD-10-CM | POA: Diagnosis not present

## 2018-02-14 DIAGNOSIS — I110 Hypertensive heart disease with heart failure: Secondary | ICD-10-CM | POA: Diagnosis not present

## 2018-02-14 DIAGNOSIS — G3184 Mild cognitive impairment, so stated: Secondary | ICD-10-CM | POA: Diagnosis not present

## 2018-02-14 DIAGNOSIS — E1143 Type 2 diabetes mellitus with diabetic autonomic (poly)neuropathy: Secondary | ICD-10-CM | POA: Diagnosis not present

## 2018-02-14 DIAGNOSIS — I5032 Chronic diastolic (congestive) heart failure: Secondary | ICD-10-CM | POA: Diagnosis not present

## 2018-02-14 DIAGNOSIS — I482 Chronic atrial fibrillation: Secondary | ICD-10-CM | POA: Diagnosis not present

## 2018-02-14 DIAGNOSIS — J449 Chronic obstructive pulmonary disease, unspecified: Secondary | ICD-10-CM | POA: Diagnosis not present

## 2018-02-14 DIAGNOSIS — I272 Pulmonary hypertension, unspecified: Secondary | ICD-10-CM | POA: Diagnosis not present

## 2018-02-15 DIAGNOSIS — I5032 Chronic diastolic (congestive) heart failure: Secondary | ICD-10-CM | POA: Diagnosis not present

## 2018-02-15 DIAGNOSIS — I272 Pulmonary hypertension, unspecified: Secondary | ICD-10-CM | POA: Diagnosis not present

## 2018-02-15 DIAGNOSIS — F329 Major depressive disorder, single episode, unspecified: Secondary | ICD-10-CM | POA: Diagnosis not present

## 2018-02-15 DIAGNOSIS — J449 Chronic obstructive pulmonary disease, unspecified: Secondary | ICD-10-CM | POA: Diagnosis not present

## 2018-02-15 DIAGNOSIS — E1143 Type 2 diabetes mellitus with diabetic autonomic (poly)neuropathy: Secondary | ICD-10-CM | POA: Diagnosis not present

## 2018-02-15 DIAGNOSIS — I482 Chronic atrial fibrillation: Secondary | ICD-10-CM | POA: Diagnosis not present

## 2018-02-15 DIAGNOSIS — G3184 Mild cognitive impairment, so stated: Secondary | ICD-10-CM | POA: Diagnosis not present

## 2018-02-15 DIAGNOSIS — I2510 Atherosclerotic heart disease of native coronary artery without angina pectoris: Secondary | ICD-10-CM | POA: Diagnosis not present

## 2018-02-15 DIAGNOSIS — I110 Hypertensive heart disease with heart failure: Secondary | ICD-10-CM | POA: Diagnosis not present

## 2018-02-16 DIAGNOSIS — E1143 Type 2 diabetes mellitus with diabetic autonomic (poly)neuropathy: Secondary | ICD-10-CM | POA: Diagnosis not present

## 2018-02-16 DIAGNOSIS — I110 Hypertensive heart disease with heart failure: Secondary | ICD-10-CM | POA: Diagnosis not present

## 2018-02-16 DIAGNOSIS — G3184 Mild cognitive impairment, so stated: Secondary | ICD-10-CM | POA: Diagnosis not present

## 2018-02-16 DIAGNOSIS — F329 Major depressive disorder, single episode, unspecified: Secondary | ICD-10-CM | POA: Diagnosis not present

## 2018-02-16 DIAGNOSIS — I2510 Atherosclerotic heart disease of native coronary artery without angina pectoris: Secondary | ICD-10-CM | POA: Diagnosis not present

## 2018-02-16 DIAGNOSIS — I272 Pulmonary hypertension, unspecified: Secondary | ICD-10-CM | POA: Diagnosis not present

## 2018-02-16 DIAGNOSIS — I5032 Chronic diastolic (congestive) heart failure: Secondary | ICD-10-CM | POA: Diagnosis not present

## 2018-02-16 DIAGNOSIS — I482 Chronic atrial fibrillation: Secondary | ICD-10-CM | POA: Diagnosis not present

## 2018-02-16 DIAGNOSIS — J449 Chronic obstructive pulmonary disease, unspecified: Secondary | ICD-10-CM | POA: Diagnosis not present

## 2018-02-17 DIAGNOSIS — I110 Hypertensive heart disease with heart failure: Secondary | ICD-10-CM | POA: Diagnosis not present

## 2018-02-17 DIAGNOSIS — I5032 Chronic diastolic (congestive) heart failure: Secondary | ICD-10-CM | POA: Diagnosis not present

## 2018-02-17 DIAGNOSIS — I482 Chronic atrial fibrillation: Secondary | ICD-10-CM | POA: Diagnosis not present

## 2018-02-17 DIAGNOSIS — G3184 Mild cognitive impairment, so stated: Secondary | ICD-10-CM | POA: Diagnosis not present

## 2018-02-17 DIAGNOSIS — J449 Chronic obstructive pulmonary disease, unspecified: Secondary | ICD-10-CM | POA: Diagnosis not present

## 2018-02-17 DIAGNOSIS — E1143 Type 2 diabetes mellitus with diabetic autonomic (poly)neuropathy: Secondary | ICD-10-CM | POA: Diagnosis not present

## 2018-02-17 DIAGNOSIS — I272 Pulmonary hypertension, unspecified: Secondary | ICD-10-CM | POA: Diagnosis not present

## 2018-02-17 DIAGNOSIS — I2510 Atherosclerotic heart disease of native coronary artery without angina pectoris: Secondary | ICD-10-CM | POA: Diagnosis not present

## 2018-02-17 DIAGNOSIS — F329 Major depressive disorder, single episode, unspecified: Secondary | ICD-10-CM | POA: Diagnosis not present

## 2018-02-23 DIAGNOSIS — G3184 Mild cognitive impairment, so stated: Secondary | ICD-10-CM | POA: Diagnosis not present

## 2018-02-23 DIAGNOSIS — I5032 Chronic diastolic (congestive) heart failure: Secondary | ICD-10-CM | POA: Diagnosis not present

## 2018-02-23 DIAGNOSIS — J449 Chronic obstructive pulmonary disease, unspecified: Secondary | ICD-10-CM | POA: Diagnosis not present

## 2018-02-23 DIAGNOSIS — I2510 Atherosclerotic heart disease of native coronary artery without angina pectoris: Secondary | ICD-10-CM | POA: Diagnosis not present

## 2018-02-23 DIAGNOSIS — E1143 Type 2 diabetes mellitus with diabetic autonomic (poly)neuropathy: Secondary | ICD-10-CM | POA: Diagnosis not present

## 2018-02-23 DIAGNOSIS — I110 Hypertensive heart disease with heart failure: Secondary | ICD-10-CM | POA: Diagnosis not present

## 2018-02-23 DIAGNOSIS — F329 Major depressive disorder, single episode, unspecified: Secondary | ICD-10-CM | POA: Diagnosis not present

## 2018-02-23 DIAGNOSIS — I482 Chronic atrial fibrillation: Secondary | ICD-10-CM | POA: Diagnosis not present

## 2018-02-23 DIAGNOSIS — I272 Pulmonary hypertension, unspecified: Secondary | ICD-10-CM | POA: Diagnosis not present

## 2018-02-24 DIAGNOSIS — F329 Major depressive disorder, single episode, unspecified: Secondary | ICD-10-CM | POA: Diagnosis not present

## 2018-02-24 DIAGNOSIS — G3184 Mild cognitive impairment, so stated: Secondary | ICD-10-CM | POA: Diagnosis not present

## 2018-02-24 DIAGNOSIS — I5032 Chronic diastolic (congestive) heart failure: Secondary | ICD-10-CM | POA: Diagnosis not present

## 2018-02-24 DIAGNOSIS — I482 Chronic atrial fibrillation: Secondary | ICD-10-CM | POA: Diagnosis not present

## 2018-02-24 DIAGNOSIS — I110 Hypertensive heart disease with heart failure: Secondary | ICD-10-CM | POA: Diagnosis not present

## 2018-02-24 DIAGNOSIS — J449 Chronic obstructive pulmonary disease, unspecified: Secondary | ICD-10-CM | POA: Diagnosis not present

## 2018-02-24 DIAGNOSIS — E1143 Type 2 diabetes mellitus with diabetic autonomic (poly)neuropathy: Secondary | ICD-10-CM | POA: Diagnosis not present

## 2018-02-24 DIAGNOSIS — I2510 Atherosclerotic heart disease of native coronary artery without angina pectoris: Secondary | ICD-10-CM | POA: Diagnosis not present

## 2018-02-24 DIAGNOSIS — I272 Pulmonary hypertension, unspecified: Secondary | ICD-10-CM | POA: Diagnosis not present

## 2018-02-25 ENCOUNTER — Telehealth: Payer: Self-pay | Admitting: Family Medicine

## 2018-02-25 DIAGNOSIS — J449 Chronic obstructive pulmonary disease, unspecified: Secondary | ICD-10-CM | POA: Diagnosis not present

## 2018-02-25 DIAGNOSIS — I272 Pulmonary hypertension, unspecified: Secondary | ICD-10-CM | POA: Diagnosis not present

## 2018-02-25 DIAGNOSIS — F329 Major depressive disorder, single episode, unspecified: Secondary | ICD-10-CM | POA: Diagnosis not present

## 2018-02-25 DIAGNOSIS — I5032 Chronic diastolic (congestive) heart failure: Secondary | ICD-10-CM | POA: Diagnosis not present

## 2018-02-25 DIAGNOSIS — E1143 Type 2 diabetes mellitus with diabetic autonomic (poly)neuropathy: Secondary | ICD-10-CM | POA: Diagnosis not present

## 2018-02-25 DIAGNOSIS — I110 Hypertensive heart disease with heart failure: Secondary | ICD-10-CM | POA: Diagnosis not present

## 2018-02-25 DIAGNOSIS — I2510 Atherosclerotic heart disease of native coronary artery without angina pectoris: Secondary | ICD-10-CM | POA: Diagnosis not present

## 2018-02-25 DIAGNOSIS — R269 Unspecified abnormalities of gait and mobility: Secondary | ICD-10-CM

## 2018-02-25 DIAGNOSIS — I482 Chronic atrial fibrillation: Secondary | ICD-10-CM | POA: Diagnosis not present

## 2018-02-25 DIAGNOSIS — G3184 Mild cognitive impairment, so stated: Secondary | ICD-10-CM | POA: Diagnosis not present

## 2018-02-25 NOTE — Telephone Encounter (Signed)
Will forward to MD. Chequita Mofield,CMA  

## 2018-02-25 NOTE — Telephone Encounter (Signed)
Famyra physical therapist from [facility] is calling to request a written order for a 4 wheel roll walker with a seat and a transfer tub bench. The fax number for their office is [phone]

## 2018-02-28 NOTE — Telephone Encounter (Signed)
DME ordered and faxed to [facility].  Matthew Edwards,CMA

## 2018-03-02 DIAGNOSIS — F329 Major depressive disorder, single episode, unspecified: Secondary | ICD-10-CM | POA: Diagnosis not present

## 2018-03-02 DIAGNOSIS — I482 Chronic atrial fibrillation: Secondary | ICD-10-CM | POA: Diagnosis not present

## 2018-03-02 DIAGNOSIS — I5032 Chronic diastolic (congestive) heart failure: Secondary | ICD-10-CM | POA: Diagnosis not present

## 2018-03-02 DIAGNOSIS — E1143 Type 2 diabetes mellitus with diabetic autonomic (poly)neuropathy: Secondary | ICD-10-CM | POA: Diagnosis not present

## 2018-03-02 DIAGNOSIS — G3184 Mild cognitive impairment, so stated: Secondary | ICD-10-CM | POA: Diagnosis not present

## 2018-03-02 DIAGNOSIS — I2510 Atherosclerotic heart disease of native coronary artery without angina pectoris: Secondary | ICD-10-CM | POA: Diagnosis not present

## 2018-03-02 DIAGNOSIS — J449 Chronic obstructive pulmonary disease, unspecified: Secondary | ICD-10-CM | POA: Diagnosis not present

## 2018-03-02 DIAGNOSIS — I110 Hypertensive heart disease with heart failure: Secondary | ICD-10-CM | POA: Diagnosis not present

## 2018-03-02 DIAGNOSIS — I272 Pulmonary hypertension, unspecified: Secondary | ICD-10-CM | POA: Diagnosis not present

## 2018-03-04 DIAGNOSIS — I482 Chronic atrial fibrillation: Secondary | ICD-10-CM | POA: Diagnosis not present

## 2018-03-04 DIAGNOSIS — E1143 Type 2 diabetes mellitus with diabetic autonomic (poly)neuropathy: Secondary | ICD-10-CM | POA: Diagnosis not present

## 2018-03-04 DIAGNOSIS — F329 Major depressive disorder, single episode, unspecified: Secondary | ICD-10-CM | POA: Diagnosis not present

## 2018-03-04 DIAGNOSIS — I2510 Atherosclerotic heart disease of native coronary artery without angina pectoris: Secondary | ICD-10-CM | POA: Diagnosis not present

## 2018-03-04 DIAGNOSIS — I272 Pulmonary hypertension, unspecified: Secondary | ICD-10-CM | POA: Diagnosis not present

## 2018-03-04 DIAGNOSIS — G3184 Mild cognitive impairment, so stated: Secondary | ICD-10-CM | POA: Diagnosis not present

## 2018-03-04 DIAGNOSIS — J449 Chronic obstructive pulmonary disease, unspecified: Secondary | ICD-10-CM | POA: Diagnosis not present

## 2018-03-04 DIAGNOSIS — I5032 Chronic diastolic (congestive) heart failure: Secondary | ICD-10-CM | POA: Diagnosis not present

## 2018-03-04 DIAGNOSIS — I110 Hypertensive heart disease with heart failure: Secondary | ICD-10-CM | POA: Diagnosis not present

## 2018-03-10 DIAGNOSIS — I110 Hypertensive heart disease with heart failure: Secondary | ICD-10-CM | POA: Diagnosis not present

## 2018-03-10 DIAGNOSIS — G3184 Mild cognitive impairment, so stated: Secondary | ICD-10-CM | POA: Diagnosis not present

## 2018-03-10 DIAGNOSIS — I272 Pulmonary hypertension, unspecified: Secondary | ICD-10-CM | POA: Diagnosis not present

## 2018-03-10 DIAGNOSIS — I2510 Atherosclerotic heart disease of native coronary artery without angina pectoris: Secondary | ICD-10-CM | POA: Diagnosis not present

## 2018-03-10 DIAGNOSIS — I5032 Chronic diastolic (congestive) heart failure: Secondary | ICD-10-CM | POA: Diagnosis not present

## 2018-03-10 DIAGNOSIS — J449 Chronic obstructive pulmonary disease, unspecified: Secondary | ICD-10-CM | POA: Diagnosis not present

## 2018-03-10 DIAGNOSIS — I482 Chronic atrial fibrillation: Secondary | ICD-10-CM | POA: Diagnosis not present

## 2018-03-10 DIAGNOSIS — E1143 Type 2 diabetes mellitus with diabetic autonomic (poly)neuropathy: Secondary | ICD-10-CM | POA: Diagnosis not present

## 2018-03-10 DIAGNOSIS — F329 Major depressive disorder, single episode, unspecified: Secondary | ICD-10-CM | POA: Diagnosis not present

## 2018-03-24 ENCOUNTER — Ambulatory Visit (INDEPENDENT_AMBULATORY_CARE_PROVIDER_SITE_OTHER): Payer: Medicare HMO | Admitting: *Deleted

## 2018-03-24 DIAGNOSIS — I2510 Atherosclerotic heart disease of native coronary artery without angina pectoris: Secondary | ICD-10-CM | POA: Diagnosis not present

## 2018-03-24 DIAGNOSIS — I359 Nonrheumatic aortic valve disorder, unspecified: Secondary | ICD-10-CM | POA: Diagnosis not present

## 2018-03-24 DIAGNOSIS — I10 Essential (primary) hypertension: Secondary | ICD-10-CM | POA: Diagnosis not present

## 2018-03-24 LAB — POCT INR: INR: 1.5 — AB (ref 2.0–3.0)

## 2018-03-31 ENCOUNTER — Ambulatory Visit: Payer: Medicare HMO

## 2018-04-06 ENCOUNTER — Other Ambulatory Visit: Payer: Self-pay | Admitting: Family Medicine

## 2018-04-06 DIAGNOSIS — E0843 Diabetes mellitus due to underlying condition with diabetic autonomic (poly)neuropathy: Secondary | ICD-10-CM

## 2018-05-06 ENCOUNTER — Other Ambulatory Visit: Payer: Self-pay

## 2018-05-06 ENCOUNTER — Emergency Department (HOSPITAL_COMMUNITY)
Admission: EM | Admit: 2018-05-06 | Discharge: 2018-05-06 | Disposition: A | Discharge status: Home / Self Care | Payer: Medicare HMO | Attending: Emergency Medicine | Admitting: Emergency Medicine

## 2018-05-06 ENCOUNTER — Encounter (HOSPITAL_COMMUNITY): Payer: Self-pay

## 2018-05-06 DIAGNOSIS — F0390 Unspecified dementia without behavioral disturbance: Secondary | ICD-10-CM | POA: Insufficient documentation

## 2018-05-06 DIAGNOSIS — E119 Type 2 diabetes mellitus without complications: Secondary | ICD-10-CM | POA: Diagnosis not present

## 2018-05-06 DIAGNOSIS — I5032 Chronic diastolic (congestive) heart failure: Secondary | ICD-10-CM | POA: Insufficient documentation

## 2018-05-06 DIAGNOSIS — Z7984 Long term (current) use of oral hypoglycemic drugs: Secondary | ICD-10-CM | POA: Insufficient documentation

## 2018-05-06 DIAGNOSIS — Z952 Presence of prosthetic heart valve: Secondary | ICD-10-CM | POA: Insufficient documentation

## 2018-05-06 DIAGNOSIS — I2510 Atherosclerotic heart disease of native coronary artery without angina pectoris: Secondary | ICD-10-CM | POA: Insufficient documentation

## 2018-05-06 DIAGNOSIS — Z951 Presence of aortocoronary bypass graft: Secondary | ICD-10-CM | POA: Diagnosis not present

## 2018-05-06 DIAGNOSIS — K0889 Other specified disorders of teeth and supporting structures: Secondary | ICD-10-CM | POA: Diagnosis not present

## 2018-05-06 DIAGNOSIS — I110 Hypertensive heart disease with heart failure: Secondary | ICD-10-CM | POA: Diagnosis not present

## 2018-05-06 DIAGNOSIS — Z79899 Other long term (current) drug therapy: Secondary | ICD-10-CM | POA: Diagnosis not present

## 2018-05-06 DIAGNOSIS — J449 Chronic obstructive pulmonary disease, unspecified: Secondary | ICD-10-CM | POA: Diagnosis not present

## 2018-05-06 DIAGNOSIS — Z7901 Long term (current) use of anticoagulants: Secondary | ICD-10-CM | POA: Diagnosis not present

## 2018-05-06 MED ORDER — AMOXICILLIN 500 MG PO CAPS
500.0000 mg | ORAL_CAPSULE | Freq: Once | ORAL | Status: AC
  Administered 2018-05-06: 500 mg via ORAL
  Filled 2018-05-06: qty 1

## 2018-05-06 MED ORDER — AMOXICILLIN 500 MG PO CAPS: 500.0000 mg | ORAL_CAPSULE | Freq: Three times a day (TID) | ORAL | 0 refills | Status: DC

## 2018-05-06 NOTE — Discharge Instructions (Addendum)
Please return for any problem. Follow up with your dentist as instructed for definitive treatment of your dental pain.

## 2018-05-06 NOTE — ED Triage Notes (Signed)
Pt states that he has 4 teeth that need to be pulled. Pt states that his mouth hurts from that. Family member states that she wanted him to come here because he's on warfarin and knows he needs to stop his dose prior to getting the teeth being taken care of. Pt is unable to get into his PCP office for several days.

## 2018-05-06 NOTE — ED Notes (Signed)
Bed: [facility] Expected date:  Expected time:  Means of arrival:  Comments: 

## 2018-05-06 NOTE — ED Notes (Signed)
As patient and daughter are walking out, they approached this RN and said "When I get the bill for this hospital stay, I'd better be charged less because we were in the hallway. If we get charged the same as if we were in a treatment room, that had better not happen, because we were out in the open in a hallway when there were open rooms all around [place]." Previously, the daughter had asked Taquita RN If they were put in the hallway due to their insurance. This RN tried to explain that due to the patient's severity of his illness, we put the patient in the hallway in order to speed up his treatment process, and allow him to be seen faster. The Patient and daughter were not appeased and walked out of the ER mumbling to themselves, saying something about disputing the bill.

## 2018-05-06 NOTE — ED Provider Notes (Signed)
[facility] DEPT Provider Note   CSN: [id number] Arrival date & time: 05/06/18  0818     History   Chief Complaint Chief Complaint  Patient presents with  . Dental Pain    HPI Matthew Edwards is a 77 y.o. male.  77 year old male with prior medical history as detailed below presents for evaluation of dental pain.  Patient reports that two of his teeth began to hurt more overnight.  He has a longstanding history of advanced dental decay.  It is been quite sometime since he has seen a dentist.  Patient took some Tylenol at home with minimal improvement in his symptoms.  Patient denies associated fever.  He denies associated chest pain, shortness of breath, nausea, vomiting, or other acute complaint.  He is accompanied by family member.  Patient is on Coumadin for chronic A. fib.  No bleeding has been noted from his gums.  The history is provided by the patient, medical records and a relative.  Dental Pain   This is a new problem. The current episode started 6 to 12 hours ago. The problem occurs rarely. The problem has not changed since onset.The pain is mild. He has tried acetaminophen for the symptoms.    Past Medical History:  Diagnosis Date  . Arthritis   . Atrial fibrillation ([place])   . CAD (coronary artery disease) 2012   [place]-LAD 2 or 3 srents  . Complication of anesthesia    "loopy after polpy removal dec 2017, lasted several days"  . COPD (chronic obstructive pulmonary disease) ([place])   . Dementia ([place])   . GERD (gastroesophageal reflux disease)   . HEART FAILURE, CONGESTIVE UNSPEC 02/23/2007   Qualifier: Diagnosis of  By: Mellody Drown MD, [facility]    . Hyperlipidemia   . Hypertension   . OSA (obstructive sleep apnea) 11/13/2014  . S/P AVR    #25 mm Edwards pericardial valve Bioprosthetic. Dr Cyndia Bent.  . Situational depression    "son passed 11/2013"  . Sleep apnea    occ uses c pap does not know settings   . Stented coronary artery 2013  .  TOBACCO DEPENDENCE 07/29/2006   Qualifier: History of  By: Carlena Sax  MD, [place] [place]    . Type II diabetes mellitus [facility])     Patient Active Problem List   Diagnosis Date Noted  . Delirium 12/27/2017  . Fever 12/27/2017  . Chronic diastolic CHF (congestive heart failure) ([place]) 12/27/2017  . Counseling and coordination of care 11/20/2017  . Cellulitis of abdominal wall 11/11/2017  . Lateral epicondylitis of right elbow 09/27/2016  . Acute pain of both knees 09/08/2016  . Chronic bilateral low back pain without sciatica 09/08/2016  . Adenomatous rectal polyp with high grade dysplasia s/p TEM resection 05/26/2016 05/26/2016  . Spinal stenosis of lumbar region 11/28/2015  . Abnormality of gait   . Type 2 diabetes mellitus with complication, without long-term current use of insulin ([place])   . Constipation 01/28/2015  . OSA (obstructive sleep apnea) 11/13/2014  . Pulmonary HTN ([place]) 08/24/2014  . Chronic anticoagulation   . Chronic atrial fibrillation ([place])   . Acute on chronic congestive heart failure with left ventricular diastolic dysfunction ([place]) 08/02/2014  . Cognitive impairment 07/02/2014  . Abdominal distention 06/28/2014  . S/P AVR 12/29/2012  . Diabetic neuropathy ([place]) 11/25/2011  . Excessive tearing 10/06/2011  . BENIGN POSITIONAL VERTIGO 06/14/2010  . Dizziness 06/14/2010  . Vitamin D deficiency 02/21/2010  . Primary hyperparathyroidism ([place]) 06/04/2009  . Hypercalcemia  05/29/2009  . Essential hypertension 02/23/2007  . DEPRESSION 12/15/2006  . Coronary atherosclerosis 12/15/2006  . GERD 12/15/2006  . COPD (chronic obstructive pulmonary disease) ([place]) 12/10/2006  . HLD (hyperlipidemia) 07/29/2006  . Alcohol abuse 07/29/2006  . Aortic valve disorder 07/29/2006    Past Surgical History:  Procedure Laterality Date  . AORTIC VALVE REPLACEMENT  07/2010   notes 07/28/2010  . CARDIAC CATHETERIZATION  06/2010   Archie Endo 07/01/2010  . COLONOSCOPY WITH PROPOFOL N/A 02/08/2017    Procedure: COLONOSCOPY WITH PROPOFOL;  Surgeon: Doran Stabler, MD;  Location: [facility];  Service: Gastroenterology;  Laterality: N/A;  . CORONARY ANGIOPLASTY WITH STENT PLACEMENT  2006; 10/2006   "2"; 1/notes 10/01/2010  . CORONARY ARTERY BYPASS GRAFT  07/2010   "1"/notes 07/28/2010  . PARTIAL PROCTECTOMY BY TEM N/A 05/26/2016   Procedure: TEM PARTIAL PROCTECTOMY OF RECTAL MASS;  Surgeon: [name], MD;  Location: [facility] ORS;  Service: General;  Laterality: N/A;        Home Medications    Prior to Admission medications   Medication Sig Start Date End Date Taking? Authorizing Provider  acetaminophen (TYLENOL) 325 MG tablet Take 650 mg by mouth every 6 (six) hours as needed for mild pain or moderate pain.    [provider]  amoxicillin (AMOXIL) 500 MG capsule Take 1 capsule (500 mg total) by mouth 3 (three) times daily. 05/06/18   Valarie Merino, MD  atorvastatin (LIPITOR) 40 MG tablet TAKE 1 TABLET DAILY AT 6 PM Patient taking differently: Take 40 mg by mouth daily at 6 PM. TAKE 1 TABLET DAILY AT 6 PM 02/05/17   Donnamae Jude, MD  B Complex-C (B-COMPLEX WITH VITAMIN C) tablet Take 1 tablet by mouth daily. 12/30/17   Lavina Hamman, MD  benazepril (LOTENSIN) 20 MG tablet Take 1 tablet (20 mg total) by mouth daily. 02/05/17   Donnamae Jude, MD  diclofenac sodium (VOLTAREN) 1 % GEL Apply 2 g topically 2 (two) times daily. 9/93/57   Delora Fuel, MD  diltiazem (CARTIA XT) 120 MG 24 hr capsule Take 1 capsule (120 mg total) by mouth daily. Patient taking differently: Take 120 mg by mouth every evening.  02/05/17   Donnamae Jude, MD  furosemide (LASIX) 40 MG tablet Take 1 tablet (40 mg total) by mouth daily. 12/30/17   Lavina Hamman, MD  glipiZIDE (GLUCOTROL) 10 MG tablet Take 1 tablet (10 mg total) by mouth every evening. TAKE 1 TABLET BY MOUTH AT 5:00 PM WITH A MEAL. 04/07/18   Donnamae Jude, MD  loperamide (IMODIUM) 2 MG capsule Take 4 mg by mouth daily.    [provider]    Multiple Vitamins-Minerals (MULTIVITAMIN WITH MINERALS) tablet Take 1 tablet by mouth every evening.    [provider]  nitroGLYCERIN (NITROSTAT) 0.4 MG SL tablet Place 1 tablet (0.4 mg total) under the tongue every 5 (five) minutes as needed. For chest pain 03/04/17   [place], Shearon Clonch M, MD  potassium chloride SA (KLOR-CON M20) 20 MEQ tablet Take 1 tablet (20 mEq total) by mouth daily. Patient taking differently: Take 20 mEq by mouth at bedtime.  02/05/17   Donnamae Jude, MD  PRESCRIPTION MEDICATION Pt has CPAP machine    [provider]  SYMBICORT 80-4.5 MCG/ACT inhaler INHALE 2 PUFFS TWICE DAILY 02/10/18   Donnamae Jude, MD  vitamin C (ASCORBIC ACID) 500 MG tablet Take 1 tablet (500 mg total) by mouth every morning. 02/05/17  Donnamae Jude, MD  warfarin (COUMADIN) 5 MG tablet Take as directed 10 mg Thurs 7.5 mg all other days 02/03/18   Donnamae Jude, MD    Family History Family History  Problem Relation Age of Onset  . Heart disease Mother   . Colon cancer Neg Hx   . Esophageal cancer Neg Hx   . Stomach cancer Neg Hx   . Pancreatic cancer Neg Hx   . Liver disease Neg Hx   . Colon polyps Neg Hx     Social History Social History   Tobacco Use  . Smoking status: Former Smoker    Packs/day: 0.50    Years: 46.00    Pack years: 23.00    Types: Cigarettes  . Smokeless tobacco: Never Used  Substance Use Topics  . Alcohol use: No  . Drug use: No     Allergies   Pineapple concentrate   Review of Systems Review of Systems  All other systems reviewed and are negative.    Physical Exam Updated Vital Signs BP (!) 139/96 (BP Location: Right Arm)   Pulse (!) 120   Resp 18   Wt 110 kg   SpO2 95%   BMI 35.81 kg/m   Physical Exam  Constitutional: He is oriented to person, place, and time. He appears well-developed and well-nourished. No distress.  HENT:  Head: Normocephalic and atraumatic.  Mouth/Throat: Oropharynx is clear and moist.  Significant  diffuse dental decay - no abscess noted.   Eyes: Pupils are equal, round, and reactive to light. Conjunctivae and EOM are normal.  Neck: Normal range of motion. Neck supple.  Cardiovascular: Normal rate, regular rhythm and normal heart sounds.  Pulmonary/Chest: Effort normal and breath sounds normal. No respiratory distress.  Abdominal: Soft. He exhibits no distension. There is no tenderness.  Musculoskeletal: Normal range of motion. He exhibits no edema or deformity.  Neurological: He is alert and oriented to person, place, and time.  Skin: Skin is warm and dry.  Psychiatric: He has a normal mood and affect.  Nursing note and vitals reviewed.    ED Treatments / Results  Labs (all labs ordered are listed, but only abnormal results are displayed) Labs Reviewed - No data to display  EKG None  Radiology No results found.  Procedures Procedures (including critical care time)  Medications Ordered in ED Medications  amoxicillin (AMOXIL) capsule 500 mg (has no administration in time range)     Initial Impression / Assessment and Plan / ED Course  I have reviewed the triage vital signs and the nursing notes.  Pertinent labs & imaging results that were available during my care of the patient were reviewed by me and considered in my medical decision making (see chart for details).      MDM  Screen complete  Patient is presenting for evaluation of dental pain.  Patient's dentition is in poor condition.  I do not see evidence of a discrete dental abscess.  He has multiple dentition that could be infected.  He has diffuse pain localized to these teeth.  Patient with multiple comorbidities.  It has been sometime since he seen a dentist.  He is advised to closely follow up with dentistry. It seems likely that he may need pre-operative clearance from his cardiologist prior to any dental procedure - he and his family member are aware of this.   Patient likely would benefit from  antibiotics.   Importance of close follow up is stressed. Strict return precautions given  and understood.   Final Clinical Impressions(s) / ED Diagnoses   Final diagnoses:  Pain, dental    ED Discharge Orders         Ordered    amoxicillin (AMOXIL) 500 MG capsule  3 times daily     05/06/18 0843           Valarie Merino, MD 05/06/18 [id number]

## 2018-05-10 ENCOUNTER — Ambulatory Visit (INDEPENDENT_AMBULATORY_CARE_PROVIDER_SITE_OTHER): Payer: Medicare HMO | Admitting: *Deleted

## 2018-05-10 DIAGNOSIS — Z7901 Long term (current) use of anticoagulants: Secondary | ICD-10-CM | POA: Diagnosis not present

## 2018-05-10 LAB — POCT INR: INR: 4.3 — AB (ref 2.0–3.0)

## 2018-05-13 ENCOUNTER — Telehealth: Payer: Self-pay | Admitting: Family Medicine

## 2018-05-13 NOTE — Telephone Encounter (Signed)
Will forward to MD on how patient can take his coumadin.  Jazmin Hartsell,CMA

## 2018-05-13 NOTE — Telephone Encounter (Signed)
Patient thought they had appointment to see you about getting clearance for dental surgery.  They are worried about his coumadin.  Please call daughter at [phone].

## 2018-05-16 NOTE — Telephone Encounter (Signed)
Tried calling daughter but there was no answer and voicemail was full.  Will continue to try and reach her.  Jazmin Hartsell,CMA

## 2018-05-17 NOTE — Telephone Encounter (Signed)
Spoke with daughter and informed her of message from MD.  She said that dentist wanted PCP to write his abx for this procedure but I informed her that the oral surgeon needs to write him for any abx and pain medication that he would need.  Daughter voiced understanding on medication instructions and will call back if they have any other questions.  This procedure hasn't been scheduled yet as she was waiting to speak with someone from our office first.  [facility]

## 2018-05-20 ENCOUNTER — Ambulatory Visit: Payer: Medicare HMO

## 2018-06-13 ENCOUNTER — Ambulatory Visit: Payer: Medicare HMO | Admitting: Family Medicine

## 2018-06-29 ENCOUNTER — Ambulatory Visit (INDEPENDENT_AMBULATORY_CARE_PROVIDER_SITE_OTHER): Payer: 59 | Admitting: *Deleted

## 2018-06-29 ENCOUNTER — Ambulatory Visit: Payer: Medicare HMO | Admitting: Family Medicine

## 2018-06-29 DIAGNOSIS — I359 Nonrheumatic aortic valve disorder, unspecified: Secondary | ICD-10-CM

## 2018-06-29 LAB — POCT INR: INR: 1.9 — AB (ref 2.0–3.0)

## 2018-06-29 NOTE — Progress Notes (Signed)
Patient seen and assessed by nursing staff.  Agree with documentation and plan.  

## 2018-07-04 ENCOUNTER — Other Ambulatory Visit: Payer: Self-pay | Admitting: Family Medicine

## 2018-07-04 DIAGNOSIS — I2510 Atherosclerotic heart disease of native coronary artery without angina pectoris: Secondary | ICD-10-CM

## 2018-07-04 DIAGNOSIS — I10 Essential (primary) hypertension: Secondary | ICD-10-CM

## 2018-07-04 DIAGNOSIS — E0843 Diabetes mellitus due to underlying condition with diabetic autonomic (poly)neuropathy: Secondary | ICD-10-CM

## 2018-07-04 DIAGNOSIS — I482 Chronic atrial fibrillation, unspecified: Secondary | ICD-10-CM

## 2018-07-05 ENCOUNTER — Other Ambulatory Visit: Payer: Self-pay | Admitting: Family Medicine

## 2018-07-15 ENCOUNTER — Ambulatory Visit: Payer: 59

## 2018-07-18 ENCOUNTER — Ambulatory Visit: Payer: 59 | Admitting: Family Medicine

## 2018-07-19 DIAGNOSIS — H5203 Hypermetropia, bilateral: Secondary | ICD-10-CM | POA: Diagnosis not present

## 2018-07-19 DIAGNOSIS — H04123 Dry eye syndrome of bilateral lacrimal glands: Secondary | ICD-10-CM | POA: Diagnosis not present

## 2018-07-19 DIAGNOSIS — E119 Type 2 diabetes mellitus without complications: Secondary | ICD-10-CM | POA: Diagnosis not present

## 2018-07-19 DIAGNOSIS — H52203 Unspecified astigmatism, bilateral: Secondary | ICD-10-CM | POA: Diagnosis not present

## 2018-07-19 DIAGNOSIS — H524 Presbyopia: Secondary | ICD-10-CM | POA: Diagnosis not present

## 2018-07-19 DIAGNOSIS — H2513 Age-related nuclear cataract, bilateral: Secondary | ICD-10-CM | POA: Diagnosis not present

## 2018-07-19 LAB — HM DIABETES EYE EXAM

## 2018-08-01 ENCOUNTER — Encounter: Payer: Self-pay | Admitting: Family Medicine

## 2018-08-01 ENCOUNTER — Ambulatory Visit: Payer: Medicare HMO | Admitting: Family Medicine

## 2018-08-01 NOTE — Progress Notes (Signed)
Patient did not keep appointment today. He will be called to reschedule.

## 2018-08-09 DIAGNOSIS — H524 Presbyopia: Secondary | ICD-10-CM | POA: Diagnosis not present

## 2018-08-09 DIAGNOSIS — H52223 Regular astigmatism, bilateral: Secondary | ICD-10-CM | POA: Diagnosis not present

## 2018-08-12 ENCOUNTER — Encounter: Payer: Self-pay | Admitting: Family Medicine

## 2018-08-18 ENCOUNTER — Ambulatory Visit: Payer: Medicare HMO | Admitting: Podiatry

## 2018-09-13 ENCOUNTER — Other Ambulatory Visit: Payer: Self-pay

## 2018-09-13 ENCOUNTER — Ambulatory Visit: Payer: Medicare HMO | Admitting: Podiatry

## 2018-09-13 ENCOUNTER — Encounter: Payer: Self-pay | Admitting: Podiatry

## 2018-09-13 VITALS — Temp 98.1°F

## 2018-09-13 DIAGNOSIS — M79674 Pain in right toe(s): Secondary | ICD-10-CM | POA: Diagnosis not present

## 2018-09-13 DIAGNOSIS — B351 Tinea unguium: Secondary | ICD-10-CM | POA: Diagnosis not present

## 2018-09-13 DIAGNOSIS — L84 Corns and callosities: Secondary | ICD-10-CM

## 2018-09-13 DIAGNOSIS — E1142 Type 2 diabetes mellitus with diabetic polyneuropathy: Secondary | ICD-10-CM | POA: Diagnosis not present

## 2018-09-13 DIAGNOSIS — M79675 Pain in left toe(s): Secondary | ICD-10-CM

## 2018-09-13 DIAGNOSIS — I739 Peripheral vascular disease, unspecified: Secondary | ICD-10-CM | POA: Diagnosis not present

## 2018-09-13 DIAGNOSIS — E1151 Type 2 diabetes mellitus with diabetic peripheral angiopathy without gangrene: Secondary | ICD-10-CM | POA: Diagnosis not present

## 2018-09-13 NOTE — Progress Notes (Signed)
Subjective: Patient presents today for preventative diabetic foot care. He is accompanied by his daughter on today's visit and c/o painful, discolored, thick toenails of both feet which interfere with daily activities. Pain is aggravated when wearing enclosed shoe gear. He has seen Podiatry in the past, last visit being December of 2018.   He voices no h/o foot wounds.  He does have numbness and tingling in both feet due to neuropathy.   Donnamae Jude, MD is his PCP and last visit was 12/23/2017.   Current Outpatient Medications:  .  acetaminophen (TYLENOL) 325 MG tablet, Take 650 mg by mouth every 6 (six) hours as needed for mild pain or moderate pain., Disp: , Rfl:  .  amoxicillin (AMOXIL) 500 MG capsule, Take 1 capsule (500 mg total) by mouth 3 (three) times daily., Disp: 21 capsule, Rfl: 0 .  atorvastatin (LIPITOR) 40 MG tablet, Take 1 tablet (40 mg total) by mouth daily at 6 PM. TAKE 1 TABLET DAILY AT 6 PM, Disp: 90 tablet, Rfl: 3 .  B Complex-C (B-COMPLEX WITH VITAMIN C) tablet, Take 1 tablet by mouth daily., Disp: 60 tablet, Rfl: 0 .  benazepril (LOTENSIN) 20 MG tablet, TAKE 1 TABLET EVERY DAY, Disp: 90 tablet, Rfl: 3 .  diclofenac sodium (VOLTAREN) 1 % GEL, Apply 2 g topically 2 (two) times daily., Disp: 100 g, Rfl: 0 .  diltiazem (CARTIA XT) 120 MG 24 hr capsule, Take 1 capsule (120 mg total) by mouth daily. (Patient taking differently: Take 120 mg by mouth every evening. ), Disp: 90 capsule, Rfl: 3 .  furosemide (LASIX) 40 MG tablet, Take 1 tablet (40 mg total) by mouth 2 (two) times daily., Disp: 180 tablet, Rfl: 3 .  glipiZIDE (GLUCOTROL) 10 MG tablet, Take 1 tablet (10 mg total) by mouth every evening. TAKE 1 TABLET BY MOUTH AT 5:00 PM WITH A MEAL., Disp: 90 tablet, Rfl: 3 .  glucose blood test strip, USE TO CHECK BLOOD SUGARS FOUR TIMES DAILY, Disp: 400 each, Rfl: 3 .  loperamide (IMODIUM) 2 MG capsule, Take 4 mg by mouth daily., Disp: , Rfl:  .  metoprolol succinate (TOPROL-XL)  25 MG 24 hr tablet, Take 1 tablet (25 mg total) by mouth daily., Disp: 90 tablet, Rfl: 3 .  Multiple Vitamins-Minerals (MULTIVITAMIN WITH MINERALS) tablet, Take 1 tablet by mouth every evening., Disp: , Rfl:  .  nitroGLYCERIN (NITROSTAT) 0.4 MG SL tablet, Place 1 tablet (0.4 mg total) under the tongue every 5 (five) minutes as needed. For chest pain, Disp: 25 tablet, Rfl: 6 .  potassium chloride SA (K-DUR,KLOR-CON) 20 MEQ tablet, Take 1 tablet (20 mEq total) by mouth at bedtime., Disp: 90 tablet, Rfl: 3 .  PRESCRIPTION MEDICATION, Pt has CPAP machine, Disp: , Rfl:  .  SYMBICORT 80-4.5 MCG/ACT inhaler, INHALE 2 PUFFS TWICE DAILY, Disp: 3 g, Rfl: 11 .  vitamin C (ASCORBIC ACID) 500 MG tablet, Take 1 tablet (500 mg total) by mouth every morning., Disp: 90 tablet, Rfl: 3 .  warfarin (COUMADIN) 5 MG tablet, TAKE AS DIRECTED 10 MG THURSDAYA AND 7.5 MG ALL OTHER DAYS, Disp: 260 tablet, Rfl: 3   Allergies  Allergen Reactions  . Pineapple Concentrate Nausea And Vomiting     Objective:  Vascular Examination: Capillary refill time <4 seconds x 10 digits.  Dorsalis pedis pulses diminished b/l.  Posterior tibial pulses nonpalpable b/l.  Digital hair absent x 10 digits.  Skin temperature gradient WNL  B/l.  Dermatological Examination: Skin slightly atrophic b/l.  Interdigital spaces clear b/l.  No open wounds noted b/l.  Toenails 1-5 b/l discolored, thick, dystrophic with subungual debris and pain with palpation to nailbeds due to thickness of nails.  Hyperkeratotic lesion distal tip right hallux. No erythema, no edema, no drainage, no flocculence noted.  Musculoskeletal: Muscle strength 5/5 to all LE muscle groups  Neurological: Sensation diminished with 10 gram monofilament.  Vibratory sensation intact.  Assessment: 1. Painful onychomycosis toenails 1-5 b/l 2. Corn distal tip right hallux 3. NIDDM with Peripheral arterial disease 4. Diabetic neuropathy  Plan: 1. Toenails 1-5  b/l were debrided in length and girth without iatrogenic bleeding. Corn(s) pared distal tip right hallux utilizing sterile scalpel blade without incident. 2. Patient to continue soft, supportive shoe gear daily. 3. Patient to report any pedal injuries to medical professional immediately. 4. Follow up 3 months. 5. Patient/POA to call should there be a concern in the interim.

## 2018-09-15 ENCOUNTER — Ambulatory Visit: Payer: Medicare HMO | Admitting: Podiatry

## 2019-01-10 ENCOUNTER — Other Ambulatory Visit: Payer: Self-pay

## 2019-01-10 ENCOUNTER — Emergency Department (HOSPITAL_COMMUNITY): Payer: Medicare HMO

## 2019-01-10 ENCOUNTER — Emergency Department (HOSPITAL_COMMUNITY)
Admission: EM | Admit: 2019-01-10 | Discharge: 2019-01-14 | Disposition: A | Discharge status: Home / Self Care | Payer: Medicare HMO | Attending: Emergency Medicine | Admitting: Emergency Medicine

## 2019-01-10 ENCOUNTER — Encounter (HOSPITAL_COMMUNITY): Payer: Self-pay

## 2019-01-10 DIAGNOSIS — Z79899 Other long term (current) drug therapy: Secondary | ICD-10-CM | POA: Diagnosis not present

## 2019-01-10 DIAGNOSIS — I4891 Unspecified atrial fibrillation: Secondary | ICD-10-CM | POA: Diagnosis not present

## 2019-01-10 DIAGNOSIS — Y939 Activity, unspecified: Secondary | ICD-10-CM | POA: Insufficient documentation

## 2019-01-10 DIAGNOSIS — Z7901 Long term (current) use of anticoagulants: Secondary | ICD-10-CM | POA: Diagnosis not present

## 2019-01-10 DIAGNOSIS — M25561 Pain in right knee: Secondary | ICD-10-CM | POA: Insufficient documentation

## 2019-01-10 DIAGNOSIS — R0902 Hypoxemia: Secondary | ICD-10-CM | POA: Diagnosis not present

## 2019-01-10 DIAGNOSIS — Z20828 Contact with and (suspected) exposure to other viral communicable diseases: Secondary | ICD-10-CM | POA: Insufficient documentation

## 2019-01-10 DIAGNOSIS — R52 Pain, unspecified: Secondary | ICD-10-CM | POA: Diagnosis not present

## 2019-01-10 DIAGNOSIS — Z7984 Long term (current) use of oral hypoglycemic drugs: Secondary | ICD-10-CM | POA: Insufficient documentation

## 2019-01-10 DIAGNOSIS — I491 Atrial premature depolarization: Secondary | ICD-10-CM | POA: Diagnosis not present

## 2019-01-10 DIAGNOSIS — Y92122 Bedroom in nursing home as the place of occurrence of the external cause: Secondary | ICD-10-CM | POA: Diagnosis not present

## 2019-01-10 DIAGNOSIS — E119 Type 2 diabetes mellitus without complications: Secondary | ICD-10-CM | POA: Insufficient documentation

## 2019-01-10 DIAGNOSIS — Y999 Unspecified external cause status: Secondary | ICD-10-CM | POA: Diagnosis not present

## 2019-01-10 DIAGNOSIS — S0990XA Unspecified injury of head, initial encounter: Secondary | ICD-10-CM | POA: Diagnosis not present

## 2019-01-10 DIAGNOSIS — S299XXA Unspecified injury of thorax, initial encounter: Secondary | ICD-10-CM | POA: Diagnosis not present

## 2019-01-10 DIAGNOSIS — I10 Essential (primary) hypertension: Secondary | ICD-10-CM | POA: Diagnosis not present

## 2019-01-10 DIAGNOSIS — R404 Transient alteration of awareness: Secondary | ICD-10-CM | POA: Diagnosis not present

## 2019-01-10 DIAGNOSIS — Z03818 Encounter for observation for suspected exposure to other biological agents ruled out: Secondary | ICD-10-CM | POA: Diagnosis not present

## 2019-01-10 DIAGNOSIS — S14109A Unspecified injury at unspecified level of cervical spinal cord, initial encounter: Secondary | ICD-10-CM | POA: Diagnosis not present

## 2019-01-10 DIAGNOSIS — Z87891 Personal history of nicotine dependence: Secondary | ICD-10-CM | POA: Diagnosis not present

## 2019-01-10 DIAGNOSIS — M25461 Effusion, right knee: Secondary | ICD-10-CM | POA: Diagnosis not present

## 2019-01-10 DIAGNOSIS — S8991XA Unspecified injury of right lower leg, initial encounter: Secondary | ICD-10-CM | POA: Diagnosis present

## 2019-01-10 DIAGNOSIS — R000 Tachycardia, unspecified: Secondary | ICD-10-CM | POA: Diagnosis not present

## 2019-01-10 DIAGNOSIS — W1830XA Fall on same level, unspecified, initial encounter: Secondary | ICD-10-CM | POA: Diagnosis not present

## 2019-01-10 DIAGNOSIS — W19XXXA Unspecified fall, initial encounter: Secondary | ICD-10-CM

## 2019-01-10 DIAGNOSIS — M25551 Pain in right hip: Secondary | ICD-10-CM | POA: Diagnosis not present

## 2019-01-10 DIAGNOSIS — F0390 Unspecified dementia without behavioral disturbance: Secondary | ICD-10-CM | POA: Diagnosis not present

## 2019-01-10 LAB — CBC WITH DIFFERENTIAL/PLATELET
Abs Immature Granulocytes: 0.02 10*3/uL (ref 0.00–0.07)
Basophils Absolute: 0 10*3/uL (ref 0.0–0.1)
Basophils Relative: 0 %
Eosinophils Absolute: 0 10*3/uL (ref 0.0–0.5)
Eosinophils Relative: 0 %
HCT: 42.4 % (ref 39.0–52.0)
Hemoglobin: 13.9 g/dL (ref 13.0–17.0)
Immature Granulocytes: 0 %
Lymphocytes Relative: 16 %
Lymphs Abs: 1.6 10*3/uL (ref 0.7–4.0)
MCH: 30.4 pg (ref 26.0–34.0)
MCHC: 32.8 g/dL (ref 30.0–36.0)
MCV: 92.8 fL (ref 80.0–100.0)
Monocytes Absolute: 0.9 10*3/uL (ref 0.1–1.0)
Monocytes Relative: 9 %
Neutro Abs: 7.4 10*3/uL (ref 1.7–7.7)
Neutrophils Relative %: 75 %
Platelets: 131 10*3/uL — ABNORMAL LOW (ref 150–400)
RBC: 4.57 MIL/uL (ref 4.22–5.81)
RDW: 12.9 % (ref 11.5–15.5)
WBC: 9.9 10*3/uL (ref 4.0–10.5)
nRBC: 0 % (ref 0.0–0.2)

## 2019-01-10 LAB — COMPREHENSIVE METABOLIC PANEL
ALT: 18 U/L (ref 0–44)
AST: 24 U/L (ref 15–41)
Albumin: 3.8 g/dL (ref 3.5–5.0)
Alkaline Phosphatase: 68 U/L (ref 38–126)
Anion gap: 10 (ref 5–15)
BUN: 15 mg/dL (ref 8–23)
CO2: 27 mmol/L (ref 22–32)
Calcium: 10.1 mg/dL (ref 8.9–10.3)
Chloride: 100 mmol/L (ref 98–111)
Creatinine, Ser: 1.2 mg/dL (ref 0.61–1.24)
GFR calc Af Amer: >60 mL/min (ref >60)
GFR calc non Af Amer: 59 mL/min — ABNORMAL LOW (ref >60)
Glucose, Bld: 150 mg/dL — ABNORMAL HIGH (ref 70–99)
Potassium: 4.2 mmol/L (ref 3.5–5.1)
Sodium: 137 mmol/L (ref 135–145)
Total Bilirubin: 1.6 mg/dL — ABNORMAL HIGH (ref 0.3–1.2)
Total Protein: 6.9 g/dL (ref 6.5–8.1)

## 2019-01-10 LAB — PROTIME-INR
INR: 1.8 — ABNORMAL HIGH (ref 0.8–1.2)
Prothrombin Time: 20.3 seconds — ABNORMAL HIGH (ref 11.4–15.2)

## 2019-01-10 LAB — APTT: aPTT: 37 seconds — ABNORMAL HIGH (ref 24–36)

## 2019-01-10 LAB — LACTIC ACID, PLASMA: Lactic Acid, Venous: 1.5 mmol/L (ref 0.5–1.9)

## 2019-01-10 LAB — CK: Total CK: 125 U/L (ref 49–397)

## 2019-01-10 MED ORDER — ACETAMINOPHEN 325 MG PO TABS
650.0000 mg | ORAL_TABLET | Freq: Once | ORAL | Status: AC
  Administered 2019-01-10: 650 mg via ORAL
  Filled 2019-01-10: qty 2

## 2019-01-10 MED ORDER — SODIUM CHLORIDE 0.9 % IV BOLUS
500.0000 mL | Freq: Once | INTRAVENOUS | Status: AC
  Administered 2019-01-10: 500 mL via INTRAVENOUS

## 2019-01-10 NOTE — ED Notes (Signed)
Patient daughter calling asking for an update  Matthew Edwards  [phone]

## 2019-01-10 NOTE — ED Triage Notes (Signed)
Pt arrived via [facility]; pt from [facility] with c/o fall; pt found on the floor next to bed; according to EMS, pt stumbled backwards and landed against bed; hit back of head but denies loc; Pt has pain to R hip/knee with some swelling; pt is on blood thinners; 124/55; 67; 97% on RA; CBG 150; 98.1

## 2019-01-10 NOTE — ED Provider Notes (Addendum)
[facility] EMERGENCY DEPARTMENT Provider Note   CSN: [id number] Arrival date & time: 01/10/19  2123    History   Chief Complaint Chief Complaint  Patient presents with   Fall    HPI Matthew Edwards is a 78 y.o. male history of atrial fibrillation, valve replacement on Coumadin, dementia, COPD, hyperlipidemia, hypertension, GERD, OSA, diabetes, CAD with stent placement presents today after fall.  Patient reports that at unknown time last night he was ambulating around his room when he tripped over an ottoman and fell backwards.  Patient reports hitting the back of his head on the ground he denies loss of consciousness he was unable to get up off the ground due to right knee and right hip pain.  Patient remained on the ground for an unknown time before being found by family members who called EMS.  Patient reports that his only concern at this time is right knee and right hip pain a moderate throbbing sensation constant worsened with movement and palpation and improved with rest.  Patient denies loss of consciousness, vision changes, neck pain/stiffness, back pain, chest pain, abdominal pain, shortness of breath, cough, fever/chills, dysuria/hematuria or pain of the other 3 extremities.  He denies any additional concerns today.    HPI  Past Medical History:  Diagnosis Date   Arthritis    Atrial fibrillation ([place])    CAD (coronary artery disease) 2012   [place]-LAD 2 or 3 srents   Complication of anesthesia    "loopy after polpy removal dec 2017, lasted several days"   COPD (chronic obstructive pulmonary disease) (HCC)    Dementia (HCC)    GERD (gastroesophageal reflux disease)    HEART FAILURE, CONGESTIVE UNSPEC 02/23/2007   Qualifier: Diagnosis of  By: Mellody Drown MD, [facility]     Hyperlipidemia    Hypertension    OSA (obstructive sleep apnea) 11/13/2014   S/P AVR    #25 mm Edwards pericardial valve Bioprosthetic. Dr Cyndia Bent.   Situational depression      "son passed 11/2013"   Sleep apnea    occ uses c pap does not know settings    Stented coronary artery 2013   TOBACCO DEPENDENCE 07/29/2006   Qualifier: History of  By: Carlena Sax  MD, Stephanie     Type II diabetes mellitus ([place])     Patient Active Problem List   Diagnosis Date Noted   Delirium 12/27/2017   Fever 12/27/2017   Chronic diastolic CHF (congestive heart failure) ([place]) 12/27/2017   Counseling and coordination of care 11/20/2017   Cellulitis of abdominal wall 11/11/2017   Lateral epicondylitis of right elbow 09/27/2016   Acute pain of both knees 09/08/2016   Chronic bilateral low back pain without sciatica 09/08/2016   Adenomatous rectal polyp with high grade dysplasia s/p TEM resection 05/26/2016 05/26/2016   Spinal stenosis of lumbar region 11/28/2015   Abnormality of gait    Type 2 diabetes mellitus with complication, without long-term current use of insulin ([place])    Constipation 01/28/2015   OSA (obstructive sleep apnea) 11/13/2014   Pulmonary HTN ([place]) 08/24/2014   Chronic anticoagulation    Chronic atrial fibrillation (HCC)    Acute on chronic congestive heart failure with left ventricular diastolic dysfunction ([place]) 08/02/2014   Cognitive impairment 07/02/2014   Abdominal distention 06/28/2014   S/P AVR 12/29/2012   Diabetic neuropathy ([place]) 11/25/2011   Excessive tearing 10/06/2011   BENIGN POSITIONAL VERTIGO 06/14/2010   Dizziness 06/14/2010   Vitamin D deficiency 02/21/2010  Primary hyperparathyroidism ([place]) 06/04/2009   Hypercalcemia 05/29/2009   Essential hypertension 02/23/2007   DEPRESSION 12/15/2006   Coronary atherosclerosis 12/15/2006   GERD 12/15/2006   COPD (chronic obstructive pulmonary disease) ([place]) 12/10/2006   HLD (hyperlipidemia) 07/29/2006   Alcohol abuse 07/29/2006   Aortic valve disorder 07/29/2006    Past Surgical History:  Procedure Laterality Date   AORTIC VALVE REPLACEMENT  07/2010    notes 07/28/2010   CARDIAC CATHETERIZATION  06/2010   Archie Endo 07/01/2010   COLONOSCOPY WITH PROPOFOL N/A 02/08/2017   Procedure: COLONOSCOPY WITH PROPOFOL;  Surgeon: Doran Stabler, MD;  Location: [facility];  Service: Gastroenterology;  Laterality: N/A;   CORONARY ANGIOPLASTY WITH STENT PLACEMENT  2006; 10/2006   "2"; 1/notes 10/01/2010   CORONARY ARTERY BYPASS GRAFT  07/2010   "1"/notes 07/28/2010   PARTIAL PROCTECTOMY BY TEM N/A 05/26/2016   Procedure: TEM PARTIAL PROCTECTOMY OF RECTAL MASS;  Surgeon: [name], MD;  Location: [facility] ORS;  Service: General;  Laterality: N/A;        Home Medications    Prior to Admission medications   Medication Sig Start Date End Date Taking? Authorizing Provider  acetaminophen (TYLENOL) 325 MG tablet Take 650 mg by mouth every 6 (six) hours as needed for mild pain or moderate pain.    [provider]  amoxicillin (AMOXIL) 500 MG capsule Take 1 capsule (500 mg total) by mouth 3 (three) times daily. 05/06/18   Valarie Merino, MD  atorvastatin (LIPITOR) 40 MG tablet Take 1 tablet (40 mg total) by mouth daily at 6 PM. TAKE 1 TABLET DAILY AT 6 PM 07/05/18   Donnamae Jude, MD  B Complex-C (B-COMPLEX WITH VITAMIN C) tablet Take 1 tablet by mouth daily. 12/30/17   Lavina Hamman, MD  benazepril (LOTENSIN) 20 MG tablet TAKE 1 TABLET EVERY DAY 07/05/18   Donnamae Jude, MD  diclofenac sodium (VOLTAREN) 1 % GEL Apply 2 g topically 2 (two) times daily. 1/75/10   Delora Fuel, MD  diltiazem (CARTIA XT) 120 MG 24 hr capsule Take 1 capsule (120 mg total) by mouth daily. Patient taking differently: Take 120 mg by mouth every evening.  02/05/17   Donnamae Jude, MD  furosemide (LASIX) 40 MG tablet Take 1 tablet (40 mg total) by mouth 2 (two) times daily. 07/05/18   Donnamae Jude, MD  glipiZIDE (GLUCOTROL) 10 MG tablet Take 1 tablet (10 mg total) by mouth every evening. TAKE 1 TABLET BY MOUTH AT 5:00 PM WITH A MEAL. 04/07/18   Donnamae Jude, MD  glucose blood test  strip USE TO CHECK BLOOD SUGARS FOUR TIMES DAILY 07/05/18   Donnamae Jude, MD  loperamide (IMODIUM) 2 MG capsule Take 4 mg by mouth daily.    [provider]  metoprolol succinate (TOPROL-XL) 25 MG 24 hr tablet Take 1 tablet (25 mg total) by mouth daily. 07/05/18   Donnamae Jude, MD  Multiple Vitamins-Minerals (MULTIVITAMIN WITH MINERALS) tablet Take 1 tablet by mouth every evening.    [provider]  nitroGLYCERIN (NITROSTAT) 0.4 MG SL tablet Place 1 tablet (0.4 mg total) under the tongue every 5 (five) minutes as needed. For chest pain 03/04/17   [place], Peter M, MD  potassium chloride SA (K-DUR,KLOR-CON) 20 MEQ tablet Take 1 tablet (20 mEq total) by mouth at bedtime. 07/05/18   Donnamae Jude, MD  PRESCRIPTION MEDICATION Pt has CPAP machine    [provider]  SYMBICORT 80-4.5 MCG/ACT inhaler INHALE  2 PUFFS TWICE DAILY 02/10/18   Donnamae Jude, MD  vitamin C (ASCORBIC ACID) 500 MG tablet Take 1 tablet (500 mg total) by mouth every morning. 02/05/17   Donnamae Jude, MD  warfarin (COUMADIN) 5 MG tablet TAKE AS DIRECTED 10 MG THURSDAYA AND 7.5 MG ALL OTHER DAYS 07/05/18   Donnamae Jude, MD    Family History Family History  Problem Relation Age of Onset   Heart disease Mother    Colon cancer Neg Hx    Esophageal cancer Neg Hx    Stomach cancer Neg Hx    Pancreatic cancer Neg Hx    Liver disease Neg Hx    Colon polyps Neg Hx     Social History Social History   Tobacco Use   Smoking status: Former Smoker    Packs/day: 0.50    Years: 46.00    Pack years: 23.00    Types: Cigarettes   Smokeless tobacco: Never Used  Substance Use Topics   Alcohol use: No   Drug use: No     Allergies   Pineapple concentrate   Review of Systems Review of Systems Ten systems are reviewed and are negative for acute change except as noted in the HPI  Physical Exam Updated Vital Signs BP (!) 124/53    Pulse 78    Temp 100.2 F (37.9 C) (Oral)    Resp (!) 24    SpO2  99%   Physical Exam Constitutional:      General: He is not in acute distress.    Appearance: Normal appearance. He is well-developed. He is obese. He is not ill-appearing or diaphoretic.  HENT:     Head: Normocephalic and atraumatic. No raccoon eyes or Battle's sign.     Jaw: There is normal jaw occlusion.     Right Ear: External ear normal.     Left Ear: External ear normal.     Nose: Nose normal.  Eyes:     General: Vision grossly intact. Gaze aligned appropriately.     Extraocular Movements: Extraocular movements intact.     Pupils: Pupils are equal, round, and reactive to light.  Neck:     Trachea: Trachea and phonation normal. No tracheal deviation.     Comments: Hard collar in place. Cardiovascular:     Rate and Rhythm: Normal rate and regular rhythm.     Pulses:          Dorsalis pedis pulses are 2+ on the right side and 2+ on the left side.  Pulmonary:     Effort: Pulmonary effort is normal. No accessory muscle usage or respiratory distress.     Breath sounds: Normal breath sounds and air entry.  Chest:     Chest wall: No deformity, tenderness or crepitus.  Abdominal:     General: There is no distension.     Palpations: Abdomen is soft.     Tenderness: There is no abdominal tenderness. There is no guarding or rebound.  Musculoskeletal: Normal range of motion.     Right hip: He exhibits tenderness. He exhibits no crepitus and no deformity.     Right knee: He exhibits swelling. He exhibits no deformity. Tenderness found.     Comments: Three-person logroll utilized for evaluation of the back and neck. - No midline C/T/L spinal tenderness to palpation, no paraspinal muscle tenderness, no deformity, crepitus, or step-off noted. No sign of injury to the neck or back. - Hips stable to compression bilaterally, patient reports some  right hip pain with compression.  Patient is able to straight leg raise bilaterally, reports pain with right leg movement.  Feet:     Right foot:      Protective Sensation: 3 sites tested. 3 sites sensed.     Left foot:     Protective Sensation: 3 sites tested. 3 sites sensed.  Skin:    General: Skin is warm and dry.  Neurological:     Mental Status: He is alert.     GCS: GCS eye subscore is 4. GCS verbal subscore is 5. GCS motor subscore is 6.     Comments: Patient is hard of hearing, he is oriented to person, place, time and event, he had some difficulty with remembering the month, answers all questions appropriately.  Speech is clear and goal oriented, follows commands Major Cranial nerves without deficit, no facial droop Normal strength in upper and lower extremities bilaterally including dorsiflexion and plantar flexion, strong and equal grip strength Sensation normal to light and sharp touch Moves extremities without ataxia, coordination intact Normal finger to nose and rapid alternating movements No pronator drift  Psychiatric:        Behavior: Behavior normal.    ED Treatments / Results  Labs (all labs ordered are listed, but only abnormal results are displayed) Labs Reviewed  COMPREHENSIVE METABOLIC PANEL - Abnormal; Notable for the following components:      Result Value   Glucose, Bld 150 (*)    Total Bilirubin 1.6 (*)    GFR calc non Af Amer 59 (*)    All other components within normal limits  CBC WITH DIFFERENTIAL/PLATELET - Abnormal; Notable for the following components:   Platelets 131 (*)    All other components within normal limits  APTT - Abnormal; Notable for the following components:   aPTT 37 (*)    All other components within normal limits  PROTIME-INR - Abnormal; Notable for the following components:   Prothrombin Time 20.3 (*)    INR 1.8 (*)    All other components within normal limits  CULTURE, BLOOD (ROUTINE X 2)  CULTURE, BLOOD (ROUTINE X 2)  URINE CULTURE  LACTIC ACID, PLASMA  LACTIC ACID, PLASMA  CK  URINALYSIS, ROUTINE W REFLEX MICROSCOPIC    EKG EKG  Interpretation  Date/Time:  Tuesday January 10 2019 21:35:07 EDT Ventricular Rate:  73 PR Interval:    QRS Duration: 86 QT Interval:  381 QTC Calculation: 420 R Axis:   67 Text Interpretation:  Atrial fibrillation No acute changes No significant change since last tracing Confirmed by Varney Biles ([id number]) on 01/10/2019 11:49:47 PM   Radiology Dg Chest 1 View  Result Date: 01/10/2019 CLINICAL DATA:  Patient from home complains of fall EXAM: CHEST  1 VIEW COMPARISON:  November 27, 2017. FINDINGS: There is mild cardiomegaly with overlying median sternotomy wires and prosthetic valve. Streaky atelectasis seen at the left lower lung. The lungs are otherwise clear. No acute osseous abnormality. IMPRESSION: Streaky atelectasis seen at the right lower lung. Electronically Signed   By: Prudencio Pair M.D.   On: 01/10/2019 22:44   Dg Pelvis 1-2 Views  Result Date: 01/10/2019 CLINICAL DATA:  Fall, found down, right hip and knee pain EXAM: PELVIS - 1-2 VIEW COMPARISON:  None. FINDINGS: No acute fracture or traumatic malalignment of the pelvis or proximal femora. Femoral heads remain normally located. Mild degenerative changes are present in both hips as well as the included lumbar spine. The bowel gas pattern is normal. Few vascular  calcifications are present. The soft tissues are otherwise unremarkable. IMPRESSION: No acute osseous abnormality. Electronically Signed   By: Lovena Le M.D.   On: 01/10/2019 22:49   Ct Head Wo Contrast  Result Date: 01/10/2019 CLINICAL DATA:  78 year old male with fall. Patient was found on the floor. EXAM: CT HEAD WITHOUT CONTRAST CT CERVICAL SPINE WITHOUT CONTRAST TECHNIQUE: Multidetector CT imaging of the head and cervical spine was performed following the standard protocol without intravenous contrast. Multiplanar CT image reconstructions of the cervical spine were also generated. COMPARISON:  CT of the head dated 03/12/2017 FINDINGS: CT HEAD FINDINGS Brain: There is  moderate age-related atrophy and chronic microvascular ischemic changes. There is no acute intracranial hemorrhage. No mass effect or midline shift. No extra-axial fluid collection. Vascular: No hyperdense vessel or unexpected calcification. Skull: Normal. Negative for fracture or focal lesion. Sinuses/Orbits: No acute finding. Other: None CT CERVICAL SPINE FINDINGS Alignment: No acute subluxation. Skull base and vertebrae: No acute fracture. Osteopenia chronic cystic degenerative changes primarily involving C5 and C6 vertebra. Soft tissues and spinal canal: No prevertebral fluid or swelling. No visible canal hematoma. Disc levels: Extensive multilevel degenerative changes with disc space narrowing and endplate irregularity most prominent at C4-C5, C5-C6, and C6-C7. Upper chest: Mild emphysema. Other: Bilateral carotid bulb calcified plaques. IMPRESSION: 1. No acute intracranial hemorrhage. Moderate age-related atrophy and chronic microvascular ischemic changes. 2. No acute/traumatic cervical spine pathology. Extensive degenerative changes. Electronically Signed   By: Anner Crete M.D.   On: 01/10/2019 23:18   Ct Cervical Spine Wo Contrast  Result Date: 01/10/2019 CLINICAL DATA:  78 year old male with fall. Patient was found on the floor. EXAM: CT HEAD WITHOUT CONTRAST CT CERVICAL SPINE WITHOUT CONTRAST TECHNIQUE: Multidetector CT imaging of the head and cervical spine was performed following the standard protocol without intravenous contrast. Multiplanar CT image reconstructions of the cervical spine were also generated. COMPARISON:  CT of the head dated 03/12/2017 FINDINGS: CT HEAD FINDINGS Brain: There is moderate age-related atrophy and chronic microvascular ischemic changes. There is no acute intracranial hemorrhage. No mass effect or midline shift. No extra-axial fluid collection. Vascular: No hyperdense vessel or unexpected calcification. Skull: Normal. Negative for fracture or focal lesion.  Sinuses/Orbits: No acute finding. Other: None CT CERVICAL SPINE FINDINGS Alignment: No acute subluxation. Skull base and vertebrae: No acute fracture. Osteopenia chronic cystic degenerative changes primarily involving C5 and C6 vertebra. Soft tissues and spinal canal: No prevertebral fluid or swelling. No visible canal hematoma. Disc levels: Extensive multilevel degenerative changes with disc space narrowing and endplate irregularity most prominent at C4-C5, C5-C6, and C6-C7. Upper chest: Mild emphysema. Other: Bilateral carotid bulb calcified plaques. IMPRESSION: 1. No acute intracranial hemorrhage. Moderate age-related atrophy and chronic microvascular ischemic changes. 2. No acute/traumatic cervical spine pathology. Extensive degenerative changes. Electronically Signed   By: Anner Crete M.D.   On: 01/10/2019 23:18   Dg Knee Complete 4 Views Right  Result Date: 01/10/2019 CLINICAL DATA:  Fall, found down, right hip and knee pain EXAM: RIGHT KNEE - COMPLETE 4+ VIEW COMPARISON:  Same day femur radiograph, right knee radiographs 09/02/2016 FINDINGS: Mild tricompartmental osteoarthrosis of the right knee with a moderate right knee joint effusion but no acute fracture or traumatic malalignment. Calcifications are present in the patellar tendon which may reflect prior injury. Enthesopathic spurring is noted at the anterior superior insertion of the distal quadriceps tendon upon the patella. Mild soft tissue swelling is seen anterosuperior to the knee. Vascular calcium is noted in the posterior soft  tissues. IMPRESSION: Right knee joint effusion without acute fracture or traumatic malalignment. Soft tissue swelling anterior and superior to the knee. Calcification in the patellar tendon may reflect prior injury. Atherosclerosis. Electronically Signed   By: Lovena Le M.D.   On: 01/10/2019 22:56   Dg Femur Min 2 Views Right  Result Date: 01/10/2019 CLINICAL DATA:  Fall, found down, right hip and knee pain  EXAM: RIGHT FEMUR 2 VIEWS COMPARISON:  Same-day pelvic and knee radiographs FINDINGS: The right femur is intact. The femoral head is normally located. There are tricompartmental degenerative changes of the right knee with the right knee joint effusion better detailed on right knee radiographs. Vascular calcium is seen in the soft tissues at the level of the pelvis and posterior to the knee. Mild soft tissue swelling is present at the knee as well. IMPRESSION: No fracture of the right femur. Electronically Signed   By: Lovena Le M.D.   On: 01/10/2019 22:52    Procedures Procedures (including critical care time)  Medications Ordered in ED Medications  sodium chloride 0.9 % bolus 500 mL (500 mLs Intravenous New Bag/Given 01/10/19 2304)  acetaminophen (TYLENOL) tablet 650 mg (650 mg Oral Given 01/10/19 2258)     Initial Impression / Assessment and Plan / ED Course  I have reviewed the triage vital signs and the nursing notes.  Pertinent labs & imaging results that were available during my care of the patient were reviewed by me and considered in my medical decision making (see chart for details).    CBC nonacute CMP nonacute Lactic 1.5 CK within normal limits PT/INR/APTT elevated, patient on Coumadin Urinalysis pending EKG: Atrial fibrillation No acute changes No significant change since last tracing Confirmed by Varney Biles [phone]) on 01/10/2019 11:49:47 PM  Chest x-ray:  IMPRESSION:  Streaky atelectasis seen at the right lower lung.   DG Pelvis:  IMPRESSION:  No acute osseous abnormality.   DG Right Femur:  IMPRESSION:  No fracture of the right femur.   DG Right Knee:  IMPRESSION:  Right knee joint effusion without acute fracture or traumatic  malalignment.    Soft tissue swelling anterior and superior to the knee.    Calcification in the patellar tendon may reflect prior injury.    Atherosclerosis.   CT Head/C-Spine:  IMPRESSION:  1. No acute intracranial  hemorrhage. Moderate age-related atrophy  and chronic microvascular ischemic changes.  2. No acute/traumatic cervical spine pathology. Extensive  degenerative changes.  --- Urinalysis is still pending, vital signs have remained stable.  No sign of significant chest or abdominal injury requiring further imaging. On reassessment patient is well-appearing in no acute distress reports some knee pain improved with laying here in the emergency department.  Attempted to ambulate patient with nurse assist, patient is able to bear weight and take few steps but heavily relies on myself and RN for support, he reports that his ambulation has been worsening over the past several months and that he will need a walker at home which she does not have.  He reports that his daughter comes by daily to take care of him but does not live with him.  Patient will need social work and PT/OT evaluation.  Urinalysis is still pending.  Patient seen and evaluated by Dr. Kathrynn Humble during this visit plan to follow-up urinalysis, at that point patient will be medically cleared.  Care handoff given to Antonietta Breach PA-C at shift change, plan of care is follow-up on urinalysis for potential etiology of low-grade  fever, patient will need PT OT eval and social work, disposition per oncoming team.  Note: Portions of this report may have been transcribed using voice recognition software. Every effort was made to ensure accuracy; however, inadvertent computerized transcription errors may still be present. Final Clinical Impressions(s) / ED Diagnoses   Final diagnoses:  Fall, initial encounter  Acute pain of right knee    ED Discharge Orders    None       Gari Crown 01/11/19 0039    Deliah Boston, PA-C 01/11/19 0040    Varney Biles, MD 01/11/19 0403

## 2019-01-11 LAB — URINALYSIS, ROUTINE W REFLEX MICROSCOPIC
Bilirubin Urine: NEGATIVE
Glucose, UA: 50 mg/dL — AB
Hgb urine dipstick: NEGATIVE
Ketones, ur: 5 mg/dL — AB
Leukocytes,Ua: NEGATIVE
Nitrite: NEGATIVE
Protein, ur: NEGATIVE mg/dL
Specific Gravity, Urine: 1.021 (ref 1.005–1.030)
pH: 6 (ref 5.0–8.0)

## 2019-01-11 LAB — LACTIC ACID, PLASMA: Lactic Acid, Venous: 1.4 mmol/L (ref 0.5–1.9)

## 2019-01-11 MED ORDER — DILTIAZEM HCL ER COATED BEADS 120 MG PO CP24
120.0000 mg | ORAL_CAPSULE | Freq: Every evening | ORAL | Status: DC
  Administered 2019-01-11 – 2019-01-13 (×3): 120 mg via ORAL
  Filled 2019-01-11 (×3): qty 1

## 2019-01-11 MED ORDER — ATORVASTATIN CALCIUM 40 MG PO TABS
40.0000 mg | ORAL_TABLET | Freq: Every day | ORAL | Status: DC
  Administered 2019-01-11 – 2019-01-13 (×3): 40 mg via ORAL
  Filled 2019-01-11 (×3): qty 1

## 2019-01-11 MED ORDER — WARFARIN SODIUM 10 MG PO TABS
10.0000 mg | ORAL_TABLET | ORAL | Status: DC
  Administered 2019-01-12: 19:00:00 10 mg via ORAL
  Filled 2019-01-11 (×2): qty 1

## 2019-01-11 MED ORDER — METOPROLOL SUCCINATE ER 25 MG PO TB24
25.0000 mg | ORAL_TABLET | Freq: Every day | ORAL | Status: DC
  Administered 2019-01-13 – 2019-01-14 (×2): 25 mg via ORAL
  Filled 2019-01-11 (×2): qty 1

## 2019-01-11 MED ORDER — BENAZEPRIL HCL 20 MG PO TABS
20.0000 mg | ORAL_TABLET | Freq: Every day | ORAL | Status: DC
  Administered 2019-01-11 – 2019-01-14 (×3): 20 mg via ORAL
  Filled 2019-01-11 (×4): qty 1

## 2019-01-11 MED ORDER — FLUTICASONE FUROATE-VILANTEROL 100-25 MCG/INH IN AEPB
1.0000 | INHALATION_SPRAY | Freq: Every day | RESPIRATORY_TRACT | Status: DC
  Administered 2019-01-11: 1 via RESPIRATORY_TRACT
  Filled 2019-01-11: qty 28

## 2019-01-11 MED ORDER — NITROGLYCERIN 0.4 MG SL SUBL: 0.4000 mg | SUBLINGUAL_TABLET | SUBLINGUAL | Status: DC | PRN

## 2019-01-11 MED ORDER — FUROSEMIDE 20 MG PO TABS
40.0000 mg | ORAL_TABLET | Freq: Two times a day (BID) | ORAL | Status: DC
  Administered 2019-01-12 – 2019-01-14 (×5): 40 mg via ORAL
  Filled 2019-01-11 (×5): qty 2

## 2019-01-11 MED ORDER — POTASSIUM CHLORIDE CRYS ER 20 MEQ PO TBCR
20.0000 meq | EXTENDED_RELEASE_TABLET | Freq: Every day | ORAL | Status: DC
  Administered 2019-01-12 – 2019-01-13 (×2): 20 meq via ORAL
  Filled 2019-01-11 (×2): qty 1

## 2019-01-11 MED ORDER — WARFARIN SODIUM 7.5 MG PO TABS
7.5000 mg | ORAL_TABLET | ORAL | Status: DC
  Administered 2019-01-11 – 2019-01-13 (×2): 7.5 mg via ORAL
  Filled 2019-01-11 (×3): qty 1

## 2019-01-11 MED ORDER — GLIPIZIDE 10 MG PO TABS
10.0000 mg | ORAL_TABLET | Freq: Every evening | ORAL | Status: DC
  Administered 2019-01-11 – 2019-01-13 (×3): 10 mg via ORAL
  Filled 2019-01-11 (×4): qty 1

## 2019-01-11 MED ORDER — WARFARIN - PHYSICIAN DOSING INPATIENT: Freq: Every day | Status: DC

## 2019-01-11 NOTE — ED Notes (Signed)
Pt sitting in chair next to bed. Instructed pt to call for assistance if needed. Pt given call light and shown how to call nurses station.

## 2019-01-11 NOTE — NC FL2 (Signed)
[place] LEVEL OF CARE SCREENING TOOL     IDENTIFICATION  Patient Name: Matthew Edwards Birthdate: 01/30/1941 Sex: male Admission Date (Current Location): 01/10/2019  [facility] and [place] Number:  Herbalist and Address:  [facility] [place]. [facility], [place] [address], [place], [place] [address]      Provider Number: [id number]  Attending Physician Name and Address:  Default, Provider, MD  Relative Name and Phone Number:  Armondo Cech Ph: [phone]    Current Level of Care: Hospital Recommended Level of Care: [place] Prior Approval Number: [id number]  Date Approved/Denied: 09/20/15 PASRR Number: [id number]  Discharge Plan: SNF    Current Diagnoses: Patient Active Problem List   Diagnosis Date Noted  . Delirium 12/27/2017  . Fever 12/27/2017  . Chronic diastolic CHF (congestive heart failure) ([place]) 12/27/2017  . Counseling and coordination of care 11/20/2017  . Cellulitis of abdominal wall 11/11/2017  . Lateral epicondylitis of right elbow 09/27/2016  . Acute pain of both knees 09/08/2016  . Chronic bilateral low back pain without sciatica 09/08/2016  . Adenomatous rectal polyp with high grade dysplasia s/p TEM resection 05/26/2016 05/26/2016  . Spinal stenosis of lumbar region 11/28/2015  . Abnormality of gait   . Type 2 diabetes mellitus with complication, without long-term current use of insulin ([place])   . Constipation 01/28/2015  . OSA (obstructive sleep apnea) 11/13/2014  . Pulmonary HTN ([place]) 08/24/2014  . Chronic anticoagulation   . Chronic atrial fibrillation ([place])   . Acute on chronic congestive heart failure with left ventricular diastolic dysfunction ([place]) 08/02/2014  . Cognitive impairment 07/02/2014  . Abdominal distention 06/28/2014  . S/P AVR 12/29/2012  . Diabetic neuropathy ([place]) 11/25/2011  . Excessive tearing 10/06/2011  . BENIGN POSITIONAL VERTIGO 06/14/2010  . Dizziness 06/14/2010  . Vitamin D  deficiency 02/21/2010  . Primary hyperparathyroidism ([place]) 06/04/2009  . Hypercalcemia 05/29/2009  . Essential hypertension 02/23/2007  . DEPRESSION 12/15/2006  . Coronary atherosclerosis 12/15/2006  . GERD 12/15/2006  . COPD (chronic obstructive pulmonary disease) ([place]) 12/10/2006  . HLD (hyperlipidemia) 07/29/2006  . Alcohol abuse 07/29/2006  . Aortic valve disorder 07/29/2006    Orientation RESPIRATION BLADDER Height & Weight     Time, Situation, Place, Self  Normal Continent Weight:   Height:     BEHAVIORAL SYMPTOMS/MOOD NEUROLOGICAL BOWEL NUTRITION STATUS      Continent    AMBULATORY STATUS COMMUNICATION OF NEEDS Skin   Limited Assist Verbally Normal                       Personal Care Assistance Level of Assistance  Bathing, Dressing, Feeding Bathing Assistance: Limited assistance Feeding assistance: Independent Dressing Assistance: Limited assistance     Functional Limitations Info  Sight, Hearing, Speech Sight Info: Impaired(Pt wears glasses) Hearing Info: Impaired(Pt is very hard of hearing) Speech Info: Adequate    SPECIAL CARE FACTORS FREQUENCY  PT (By licensed PT), OT (By licensed OT)     PT Frequency: 5x weekly OT Frequency: 5x weekly            Contractures Contractures Info: Not present    Additional Factors Info  Code Status, Allergies Code Status Info: Full Code Allergies Info: Pineapple Concentrate           Current Medications (01/11/2019):  This is the current hospital active medication list Current Facility-Administered Medications  Medication Dose Route Frequency Provider Last Rate Last Dose  . atorvastatin (LIPITOR) tablet 40  mg  40 mg Oral q1800 Fawze, Mina A, PA-C   40 mg at 01/11/19 1749  . benazepril (LOTENSIN) tablet 20 mg  20 mg Oral Daily Fawze, Mina A, PA-C   20 mg at 01/11/19 1750  . diltiazem (CARDIZEM CD) 24 hr capsule 120 mg  120 mg Oral QPM Fawze, Mina A, PA-C   120 mg at 01/11/19 1748  . fluticasone  furoate-vilanterol (BREO ELLIPTA) 100-25 MCG/INH 1 puff  1 puff Inhalation Daily Fawze, Mina A, PA-C   1 puff at 01/11/19 1751  . furosemide (LASIX) tablet 40 mg  40 mg Oral BID Fawze, Mina A, PA-C      . glipiZIDE (GLUCOTROL) tablet 10 mg  10 mg Oral QPM Fawze, Mina A, PA-C   10 mg at 01/11/19 1749  . metoprolol succinate (TOPROL-XL) 24 hr tablet 25 mg  25 mg Oral Daily Fawze, Mina A, PA-C      . nitroGLYCERIN (NITROSTAT) SL tablet 0.4 mg  0.4 mg Sublingual Q5 min PRN Fawze, Mina A, PA-C      . potassium chloride SA (K-DUR) CR tablet 20 mEq  20 mEq Oral QHS Fawze, Mina A, PA-C      . [START ON 01/12/2019] warfarin (COUMADIN) tablet 10 mg  10 mg Oral Q Thu-1800 Rumbarger, Valeda Malm, RPH      . warfarin (COUMADIN) tablet 7.5 mg  7.5 mg Oral Once per day on Sun Mon Tue Wed Fri Sat Rodell Perna A, PA-C   7.5 mg at 01/11/19 1749  . Warfarin - Physician Dosing Inpatient   Does not apply q1800 Renita Papa, PA-C       Current Outpatient Medications  Medication Sig Dispense Refill  . acetaminophen (TYLENOL) 325 MG tablet Take 650 mg by mouth every 6 (six) hours as needed for mild pain or moderate pain.    Marland Kitchen amoxicillin (AMOXIL) 500 MG capsule Take 1 capsule (500 mg total) by mouth 3 (three) times daily. 21 capsule 0  . atorvastatin (LIPITOR) 40 MG tablet Take 1 tablet (40 mg total) by mouth daily at 6 PM. TAKE 1 TABLET DAILY AT 6 PM 90 tablet 3  . B Complex-C (B-COMPLEX WITH VITAMIN C) tablet Take 1 tablet by mouth daily. 60 tablet 0  . benazepril (LOTENSIN) 20 MG tablet TAKE 1 TABLET EVERY DAY 90 tablet 3  . diclofenac sodium (VOLTAREN) 1 % GEL Apply 2 g topically 2 (two) times daily. 100 g 0  . diltiazem (CARTIA XT) 120 MG 24 hr capsule Take 1 capsule (120 mg total) by mouth daily. (Patient taking differently: Take 120 mg by mouth every evening. ) 90 capsule 3  . furosemide (LASIX) 40 MG tablet Take 1 tablet (40 mg total) by mouth 2 (two) times daily. 180 tablet 3  . glipiZIDE (GLUCOTROL) 10 MG tablet  Take 1 tablet (10 mg total) by mouth every evening. TAKE 1 TABLET BY MOUTH AT 5:00 PM WITH A MEAL. 90 tablet 3  . glucose blood test strip USE TO CHECK BLOOD SUGARS FOUR TIMES DAILY 400 each 3  . loperamide (IMODIUM) 2 MG capsule Take 4 mg by mouth daily.    . metoprolol succinate (TOPROL-XL) 25 MG 24 hr tablet Take 1 tablet (25 mg total) by mouth daily. 90 tablet 3  . Multiple Vitamins-Minerals (MULTIVITAMIN WITH MINERALS) tablet Take 1 tablet by mouth every evening.    . nitroGLYCERIN (NITROSTAT) 0.4 MG SL tablet Place 1 tablet (0.4 mg total) under the tongue every 5 (five) minutes  as needed. For chest pain 25 tablet 6  . potassium chloride SA (K-DUR,KLOR-CON) 20 MEQ tablet Take 1 tablet (20 mEq total) by mouth at bedtime. 90 tablet 3  . SYMBICORT 80-4.5 MCG/ACT inhaler INHALE 2 PUFFS TWICE DAILY 3 g 11  . vitamin C (ASCORBIC ACID) 500 MG tablet Take 1 tablet (500 mg total) by mouth every morning. 90 tablet 3  . warfarin (COUMADIN) 5 MG tablet TAKE AS DIRECTED 10 MG THURSDAYA AND 7.5 MG ALL OTHER DAYS 260 tablet 3     Discharge Medications: Please see discharge summary for a list of discharge medications.  Relevant Imaging Results:  Relevant Lab Results:   Additional Information SS: [id number]  [place], LCSW

## 2019-01-11 NOTE — ED Notes (Signed)
Ordered dinner tray.  

## 2019-01-11 NOTE — ED Provider Notes (Addendum)
6:05 AM Patient care assumed from [facility], PA-C at change of shift.  He presented to the emergency department following a fall at home.  Laboratory evaluation and imaging has been reassuring.  His urinalysis does not suggest a UTI.  Patient has been boarding in the emergency department overnight pending social work consult in the morning.  Orders also placed for assessment by PT/OT.  Patient has a history of dementia.  Has been experiencing ambulation difficulty over the past several months.  There is concern as he lives at home alone.  Does not currently have a rolling walker.  Will prescribe this for the patient.    Disposition pending remaining consults.  Care to be assumed by [facility], PA-C at change of shift.   Antonietta Breach, PA-C 01/11/19 [id number]    Antonietta Breach, PA-C 01/11/19 0525    Varney Biles, MD 01/11/19 [phone]

## 2019-01-11 NOTE — Progress Notes (Signed)
CSW is aware of consult, currently awaiting PT/OT recommendations to assist with patient's discharge plan.  Madilyn Fireman, MSW, LCSW-A Clinical Social Worker [facility] [place] [facility] [phone]

## 2019-01-11 NOTE — Discharge Instructions (Signed)
You have been diagnosed today with fall with right knee pain.  At this time there does not appear to be the presence of an emergent medical condition, however there is always the potential for conditions to change. Please read and follow the below instructions.  Please return to the Emergency Department immediately for any new or worsening symptoms. Please be sure to follow up with your Primary Care Provider within one week regarding your visit today; please call their office to schedule an appointment even if you are feeling better for a follow-up visit. Continue to use rest, ice and elevation to help with your right knee pain.  You may call the orthopedic specialist Dr. Erlinda Hong for further evaluation of your right knee if your symptoms do not improve.  Get help right away if: Your knee swells, and the swelling gets worse. You cannot move your knee. You have very bad knee pain. You have: A very bad headache that is not helped by medicine. Trouble walking or weakness in your arms and legs. Clear or bloody fluid coming from your nose or ears. Changes in how you see (vision). Shaking movements that you cannot control. You lose your balance. You vomit. The black centers of your eyes (pupils) change in size. Your speech is slurred. Your dizziness gets worse. You pass out. You are sleepier than normal and have trouble staying awake. Your symptoms get worse. Any new/concerning or worsening symptoms  Please read the additional information packets attached to your discharge summary.  Do not take your medicine if  develop an itchy rash, swelling in your mouth or lips, or difficulty breathing; call 911 and seek immediate emergency medical attention if this occurs.

## 2019-01-11 NOTE — NC FL2 (Signed)
[place] MEDICAID FL2 LEVEL OF CARE SCREENING TOOL     IDENTIFICATION  Patient Name: Matthew Edwards Birthdate: 11/08/1940 Sex: male Admission Date (Current Location): 01/10/2019  County and Medicaid Number:  [place]   Facility and Address:  [facility] [place], [address], , [place] [address]      Provider Number: [id number]  Attending Physician Name and Address:  Default, Provider, MD  Relative Name and Phone Number:  [name] Wiederhold Ph: [phone]    Current Level of Care: Hospital Recommended Level of Care: Skilled Nursing Facility Prior Approval Number: [id number]  Date Approved/Denied: 09/20/15 PASRR Number: [id number]  Discharge Plan: SNF    Current Diagnoses: Patient Active Problem List   Diagnosis Date Noted  . Delirium 12/27/2017  . Fever 12/27/2017  . Chronic diastolic CHF (congestive heart failure) ([facility]) 12/27/2017  . Counseling and coordination of care 11/20/2017  . Cellulitis of abdominal wall 11/11/2017  . Lateral epicondylitis of right elbow 09/27/2016  . Acute pain of both knees 09/08/2016  . Chronic bilateral low back pain without sciatica 09/08/2016  . Adenomatous rectal polyp with high grade dysplasia s/p TEM resection 05/26/2016 05/26/2016  . Spinal stenosis of lumbar region 11/28/2015  . Abnormality of gait   . Type 2 diabetes mellitus with complication, without long-term current use of insulin ([facility])   . Constipation 01/28/2015  . OSA (obstructive sleep apnea) 11/13/2014  . Pulmonary HTN (H[facility]) 08/24/2014  . Chronic anticoagulation   . Chronic atrial fibrillation ([facility])   . Acute on chronic congestive heart failure with left ventricular diastolic dysfunction (H[facility]) 08/02/2014  . Cognitive impairment 07/02/2014  . Abdominal distention 06/28/2014  . S/P AVR 12/29/2012  . Diabetic neuropathy (H[facility]) 11/25/2011  . Excessive tearing 10/06/2011  . BENIGN POSITIONAL VERTIGO 06/14/2010  . Dizziness 06/14/2010  . Vitamin D  deficiency 02/21/2010  . Primary hyperparathyroidism (H[facility]) 06/04/2009  . Hypercalcemia 05/29/2009  . Essential hypertension 02/23/2007  . DEPRESSION 12/15/2006  . Coronary atherosclerosis 12/15/2006  . GERD 12/15/2006  . COPD (chronic obstructive pulmonary disease) (H[facility]) 12/10/2006  . HLD (hyperlipidemia) 07/29/2006  . Alcohol abuse 07/29/2006  . Aortic valve disorder 07/29/2006    Orientation RESPIRATION BLADDER Height & Weight     Time, Situation, Place, Self  Normal Continent Weight:   Height:     BEHAVIORAL SYMPTOMS/MOOD NEUROLOGICAL BOWEL NUTRITION STATUS      Continent    AMBULATORY STATUS COMMUNICATION OF NEEDS Skin   Limited Assist Verbally Normal                       Personal Care Assistance Level of Assistance  Bathing, Dressing, Feeding Bathing Assistance: Limited assistance Feeding assistance: Independent Dressing Assistance: Limited assistance     Functional Limitations Info  Sight, Hearing, Speech Sight Info: Impaired(Pt wears glasses) Hearing Info: Impaired(Pt is very hard of hearing) Speech Info: Adequate    SPECIAL CARE FACTORS FREQUENCY  PT (By licensed PT), OT (By licensed OT)     PT Frequency: 5x weekly OT Frequency: 5x weekly            Contractures Contractures Info: Not present    Additional Factors Info  Code Status, Allergies Code Status Info: Full Code Allergies Info: Pineapple Concentrate           Current Medications (01/11/2019):  This is the current hospital active medication list Current Facility-Administered Medications  Medication Dose Route Frequency Provider Last Rate Last Dose  . atorvastatin (LIPITOR) tablet 40   mg  40 mg Oral q1800 Fawze, Mina A, PA-C   40 mg at 01/11/19 1749  . benazepril (LOTENSIN) tablet 20 mg  20 mg Oral Daily Fawze, Mina A, PA-C   20 mg at 01/11/19 1750  . diltiazem (CARDIZEM CD) 24 hr capsule 120 mg  120 mg Oral QPM Fawze, Mina A, PA-C   120 mg at 01/11/19 1748  . fluticasone  furoate-vilanterol (BREO ELLIPTA) 100-25 MCG/INH 1 puff  1 puff Inhalation Daily Fawze, Mina A, PA-C   1 puff at 01/11/19 1751  . furosemide (LASIX) tablet 40 mg  40 mg Oral BID Fawze, Mina A, PA-C      . glipiZIDE (GLUCOTROL) tablet 10 mg  10 mg Oral QPM Fawze, Mina A, PA-C   10 mg at 01/11/19 1749  . metoprolol succinate (TOPROL-XL) 24 hr tablet 25 mg  25 mg Oral Daily Fawze, Mina A, PA-C      . nitroGLYCERIN (NITROSTAT) SL tablet 0.4 mg  0.4 mg Sublingual Q5 min PRN Fawze, Mina A, PA-C      . potassium chloride SA (K-DUR) CR tablet 20 mEq  20 mEq Oral QHS Fawze, Mina A, PA-C      . [START ON 01/12/2019] warfarin (COUMADIN) tablet 10 mg  10 mg Oral Q Thu-1800 Rumbarger, Rachel L, RPH      . warfarin (COUMADIN) tablet 7.5 mg  7.5 mg Oral Once per day on Sun Mon Tue Wed Fri Sat Fawze, Mina A, PA-C   7.5 mg at 01/11/19 1749  . Warfarin - Physician Dosing Inpatient   Does not apply q1800 Fawze, Mina A, PA-C       Current Outpatient Medications  Medication Sig Dispense Refill  . acetaminophen (TYLENOL) 325 MG tablet Take 650 mg by mouth every 6 (six) hours as needed for mild pain or moderate pain.    . amoxicillin (AMOXIL) 500 MG capsule Take 1 capsule (500 mg total) by mouth 3 (three) times daily. 21 capsule 0  . atorvastatin (LIPITOR) 40 MG tablet Take 1 tablet (40 mg total) by mouth daily at 6 PM. TAKE 1 TABLET DAILY AT 6 PM 90 tablet 3  . B Complex-C (B-COMPLEX WITH VITAMIN C) tablet Take 1 tablet by mouth daily. 60 tablet 0  . benazepril (LOTENSIN) 20 MG tablet TAKE 1 TABLET EVERY DAY 90 tablet 3  . diclofenac sodium (VOLTAREN) 1 % GEL Apply 2 g topically 2 (two) times daily. 100 g 0  . diltiazem (CARTIA XT) 120 MG 24 hr capsule Take 1 capsule (120 mg total) by mouth daily. (Patient taking differently: Take 120 mg by mouth every evening. ) 90 capsule 3  . furosemide (LASIX) 40 MG tablet Take 1 tablet (40 mg total) by mouth 2 (two) times daily. 180 tablet 3  . glipiZIDE (GLUCOTROL) 10 MG tablet  Take 1 tablet (10 mg total) by mouth every evening. TAKE 1 TABLET BY MOUTH AT 5:00 PM WITH A MEAL. 90 tablet 3  . glucose blood test strip USE TO CHECK BLOOD SUGARS FOUR TIMES DAILY 400 each 3  . loperamide (IMODIUM) 2 MG capsule Take 4 mg by mouth daily.    . metoprolol succinate (TOPROL-XL) 25 MG 24 hr tablet Take 1 tablet (25 mg total) by mouth daily. 90 tablet 3  . Multiple Vitamins-Minerals (MULTIVITAMIN WITH MINERALS) tablet Take 1 tablet by mouth every evening.    . nitroGLYCERIN (NITROSTAT) 0.4 MG SL tablet Place 1 tablet (0.4 mg total) under the tongue every 5 (five) minutes   as needed. For chest pain 25 tablet 6  . potassium chloride SA (K-DUR,KLOR-CON) 20 MEQ tablet Take 1 tablet (20 mEq total) by mouth at bedtime. 90 tablet 3  . SYMBICORT 80-4.5 MCG/ACT inhaler INHALE 2 PUFFS TWICE DAILY 3 g 11  . vitamin C (ASCORBIC ACID) 500 MG tablet Take 1 tablet (500 mg total) by mouth every morning. 90 tablet 3  . warfarin (COUMADIN) 5 MG tablet TAKE AS DIRECTED 10 MG THURSDAYA AND 7.5 MG ALL OTHER DAYS 260 tablet 3     Discharge Medications: Please see discharge summary for a list of discharge medications.  Relevant Imaging Results:  Relevant Lab Results:   Additional Information SS: [id number]  Mlissa Tamayo M Lynda Capistran, LCSW    

## 2019-01-11 NOTE — Progress Notes (Signed)
Placement workup complete. 1st shift CSW to follow up on bed offers once one is made.   [place] [facility]  Clinical Social Worker  Ph: [phone]

## 2019-01-11 NOTE — ED Notes (Signed)
PT & OT have just seen this patient. Ordered a regular diet for this patient while he waits for plan of care.

## 2019-01-11 NOTE — Evaluation (Signed)
Occupational Therapy Evaluation Patient Details Name: Matthew Edwards MRN: [id number] DOB: 02/15/1941 Today's Date: 01/11/2019    History of Present Illness Pt is a 78 year old man brought to the hospital from home due to a fall. Imaging negative. PMH: afib, valve replacement on Coumadin, dementia, DM, HTN and COPD.   Clinical Impression   Pt lives alone and is assisted for housekeeping, most meal prep and showering. He walks with a RW and has a hx of falls. Pt presents with impaired cognition, likely baseline. He has poor problem solving and immediate memory. He requires up to min assist for mobility and up to max for ADL. Recommending SNF.    Follow Up Recommendations  SNF;Supervision/Assistance - 24 hour    Equipment Recommendations  (defer to next venue)    Recommendations for Other Services       Precautions / Restrictions Precautions Precautions: Fall Restrictions Weight Bearing Restrictions: No      Mobility Bed Mobility Overal bed mobility: Needs Assistance Bed Mobility: Supine to Sit;Sit to Supine     Supine to sit: Min guard Sit to supine: Min guard   General bed mobility comments: Min guard for safety. Required increased time to come to sitting in stretcher.   Transfers Overall transfer level: Needs assistance Equipment used: Rolling walker (2 wheeled) Transfers: Sit to/from Stand Sit to Stand: Min guard;+2 safety/equipment         General transfer comment: Min guard for safety and steadying assist. Required extended time in standing secondary to reports of dizziness.     Balance Overall balance assessment: Needs assistance Sitting-balance support: No upper extremity supported;Feet supported Sitting balance-Leahy Scale: Fair     Standing balance support: Bilateral upper extremity supported;During functional activity Standing balance-Leahy Scale: Poor Standing balance comment: Reliant on BUE support                            ADL  either performed or assessed with clinical judgement   ADL Overall ADL's : Needs assistance/impaired Eating/Feeding: Independent;Sitting   Grooming: Wash/dry hands;Standing;Minimal assistance   Upper Body Bathing: Minimal assistance;Sitting   Lower Body Bathing: Sit to/from stand;Maximal assistance   Upper Body Dressing : Minimal assistance;Sitting   Lower Body Dressing: Maximal assistance;Sit to/from stand   Toilet Transfer: Minimal assistance;Ambulation;RW   Toileting- Clothing Manipulation and Hygiene: Minimal assistance;Sit to/from stand       Functional mobility during ADLs: Minimal assistance;Rolling walker;Cueing for safety       Vision Baseline Vision/History: Wears glasses Wears Glasses: At all times Patient Visual Report: No change from baseline       Perception     Praxis      Pertinent Vitals/Pain Pain Assessment: Faces Faces Pain Scale: Hurts little more Pain Location: RLE  Pain Descriptors / Indicators: Aching Pain Intervention(s): Monitored during session;Repositioned     Hand Dominance Right   Extremity/Trunk Assessment Upper Extremity Assessment Upper Extremity Assessment: Overall WFL for tasks assessed   Lower Extremity Assessment Lower Extremity Assessment: Defer to PT evaluation RLE Deficits / Details: RLE pain following fall.    Cervical / Trunk Assessment Cervical / Trunk Assessment: Kyphotic   Communication Communication Communication: HOH   Cognition Arousal/Alertness: Awake/alert Behavior During Therapy: WFL for tasks assessed/performed Overall Cognitive Status: History of cognitive impairments - at baseline  General Comments: Dementia at baseline. Pt with difficulty sequencing and unable to dual task during mobility. Pt with poor memory.    General Comments       Exercises     Shoulder Instructions      Home Living Family/patient expects to be discharged to:: Private  residence Living Arrangements: Alone Available Help at Discharge: Family;Available PRN/intermittently Type of Home: Apartment Home Access: Stairs to enter Entrance Stairs-Number of Steps: 1 Entrance Stairs-Rails: None Home Layout: One level     Bathroom Shower/Tub: Teacher, early years/pre: Standard     Home Equipment: Environmental consultant - 2 wheels          Prior Functioning/Environment Level of Independence: Needs assistance  Gait / Transfers Assistance Needed: Reports using RW for mobility, but reports it does not work well.  ADL's / Homemaking Assistance Needed: Needed assist with cleaning and cooking. Reports he makes his own breakfast. Daughter usually assists with shower.             OT Problem List: Decreased activity tolerance;Impaired balance (sitting and/or standing);Decreased knowledge of use of DME or AE;Pain;Decreased cognition;Decreased safety awareness;Decreased strength      OT Treatment/Interventions: Self-care/ADL training;DME and/or AE instruction;Patient/family education;Balance training;Cognitive remediation/compensation;Therapeutic activities    OT Goals(Current goals can be found in the care plan section) Acute Rehab OT Goals Patient Stated Goal: "to get a new walker so I can walk better"  OT Goal Formulation: With patient Time For Goal Achievement: 01/25/19 Potential to Achieve Goals: Good ADL Goals Pt Will Perform Grooming: with supervision;standing Pt Will Perform Lower Body Bathing: with supervision;sit to/from stand Pt Will Perform Lower Body Dressing: with supervision;sit to/from stand Pt Will Transfer to Toilet: with supervision;ambulating Pt Will Perform Toileting - Clothing Manipulation and hygiene: with supervision;sit to/from stand  OT Frequency: Min 2X/week   Barriers to D/C: Decreased caregiver support          Co-evaluation PT/OT/SLP Co-Evaluation/Treatment: Yes Reason for Co-Treatment: Necessary to address cognition/behavior  during functional activity;For patient/therapist safety PT goals addressed during session: Mobility/safety with mobility;Proper use of DME;Balance OT goals addressed during session: ADL's and self-care;Proper use of Adaptive equipment and DME      AM-PAC OT "6 Clicks" Daily Activity     Outcome Measure Help from another person eating meals?: None Help from another person taking care of personal grooming?: A Little Help from another person toileting, which includes using toliet, bedpan, or urinal?: A Lot Help from another person bathing (including washing, rinsing, drying)?: A Lot Help from another person to put on and taking off regular upper body clothing?: A Little Help from another person to put on and taking off regular lower body clothing?: A Lot 6 Click Score: 16   End of Session Equipment Utilized During Treatment: Gait belt;Rolling walker  Activity Tolerance: Patient tolerated treatment well Patient left: in bed;with call bell/phone within reach  OT Visit Diagnosis: Unsteadiness on feet (R26.81);Other abnormalities of gait and mobility (R26.89);Pain;Other symptoms and signs involving cognitive function;Muscle weakness (generalized) (M62.81);History of falling (Z91.81)                Time: 1036-1100 OT Time Calculation (min): 24 min Charges:  OT General Charges $OT Visit: 1 Visit OT Evaluation $OT Eval Moderate Complexity: 1 Mod  Nestor Lewandowsky, OTR/L Acute Rehabilitation Services Pager: [phone] Office: [phone]  Malka So 01/11/2019, 1:26 PM

## 2019-01-11 NOTE — Evaluation (Signed)
Physical Therapy Evaluation Patient Details Name: Matthew Edwards MRN: [id number] DOB: 15-Jul-1940 Today's Date: 01/11/2019   History of Present Illness  Pt is a 78 year old man brought to the hospital from home due to a fall. Imaging negative. PMH: afib, valve replacement on Coumadin, dementia, DM, HTN and COPD.  Clinical Impression  Pt presenting with problem above with deficits below. Pt presenting with cognitive deficits and poor sequencing throughout mobility tasks. Pt requiring min A +2 for safety and steadying throughout mobility. Pt unable to dual task and required manual assist with RW. Feel pt will require increased assist at d/c and pt currently lives alone. Will continue to follow acutely to maximize functional mobility independence and safety.     Follow Up Recommendations SNF;Supervision/Assistance - 24 hour    Equipment Recommendations  Rolling walker with 5" wheels    Recommendations for Other Services       Precautions / Restrictions Precautions Precautions: Fall Restrictions Weight Bearing Restrictions: No      Mobility  Bed Mobility Overal bed mobility: Needs Assistance Bed Mobility: Supine to Sit;Sit to Supine     Supine to sit: Min guard Sit to supine: Min guard   General bed mobility comments: Min guard for safety. Required increased time to come to sitting in stretcher.   Transfers Overall transfer level: Needs assistance Equipment used: Rolling walker (2 wheeled) Transfers: Sit to/from Stand Sit to Stand: Min guard;+2 safety/equipment         General transfer comment: Min guard for safety and steadying assist. Required extended time in standing secondary to reports of dizziness.   Ambulation/Gait Ambulation/Gait assistance: Min assist;+2 physical assistance;+2 safety/equipment Gait Distance (Feet): 30 Feet Assistive device: Rolling walker (2 wheeled) Gait Pattern/deviations: Step-to pattern;Decreased step length - right;Decreased step length  - left;Decreased weight shift to right;Antalgic Gait velocity: Decreased   General Gait Details: Slow, antalgic gait. Poor sequencing noted with RW. Gave cues for proximity to device, however, pt not responding to cues. Required manual blocking of RW to prevent pt from pushing RW out too far.   Stairs            Wheelchair Mobility    Modified Rankin (Stroke Patients Only)       Balance Overall balance assessment: Needs assistance Sitting-balance support: No upper extremity supported;Feet supported Sitting balance-Leahy Scale: Fair     Standing balance support: Bilateral upper extremity supported;During functional activity Standing balance-Leahy Scale: Poor Standing balance comment: Reliant on BUE support                              Pertinent Vitals/Pain Pain Assessment: Faces Faces Pain Scale: Hurts little more Pain Location: RLE  Pain Descriptors / Indicators: Aching Pain Intervention(s): Limited activity within patient's tolerance;Monitored during session;Repositioned    Home Living Family/patient expects to be discharged to:: Private residence Living Arrangements: Alone Available Help at Discharge: Family;Available PRN/intermittently Type of Home: Apartment Home Access: Stairs to enter Entrance Stairs-Rails: None Entrance Stairs-Number of Steps: 1 Home Layout: One level Home Equipment: Walker - 2 wheels      Prior Function Level of Independence: Needs assistance   Gait / Transfers Assistance Needed: Reports using RW for mobility, but reports it does not work well.   ADL's / Homemaking Assistance Needed: Needed assist with cleaning and cooking. Reports he makes his own breakfast. Daughter usually assists with shower.         Hand Dominance  Extremity/Trunk Assessment   Upper Extremity Assessment Upper Extremity Assessment: Defer to OT evaluation    Lower Extremity Assessment Lower Extremity Assessment: Generalized weakness;RLE  deficits/detail RLE Deficits / Details: RLE pain following fall.     Cervical / Trunk Assessment Cervical / Trunk Assessment: Kyphotic  Communication   Communication: No difficulties  Cognition Arousal/Alertness: Awake/alert Behavior During Therapy: WFL for tasks assessed/performed Overall Cognitive Status: History of cognitive impairments - at baseline                                 General Comments: Dementia at baseline. Pt with difficulty sequencing and unable to dual task during mobility. Pt with poor memory.       General Comments      Exercises     Assessment/Plan    PT Assessment Patient needs continued PT services  PT Problem List Decreased strength;Decreased balance;Decreased mobility;Decreased cognition;Decreased knowledge of use of DME;Decreased safety awareness;Decreased knowledge of precautions       PT Treatment Interventions DME instruction;Gait training;Functional mobility training;Therapeutic activities;Balance training;Therapeutic exercise;Patient/family education;Cognitive remediation    PT Goals (Current goals can be found in the Care Plan section)  Acute Rehab PT Goals Patient Stated Goal: "to get a new walker so I can walk better"  PT Goal Formulation: With patient Time For Goal Achievement: 01/25/19 Potential to Achieve Goals: Fair    Frequency Min 2X/week   Barriers to discharge Decreased caregiver support      Co-evaluation PT/OT/SLP Co-Evaluation/Treatment: Yes Reason for Co-Treatment: Necessary to address cognition/behavior during functional activity;For patient/therapist safety;To address functional/ADL transfers PT goals addressed during session: Mobility/safety with mobility;Proper use of DME;Balance         AM-PAC PT "6 Clicks" Mobility  Outcome Measure Help needed turning from your back to your side while in a flat bed without using bedrails?: A Little Help needed moving from lying on your back to sitting on the  side of a flat bed without using bedrails?: A Little Help needed moving to and from a bed to a chair (including a wheelchair)?: A Little Help needed standing up from a chair using your arms (e.g., wheelchair or bedside chair)?: A Little Help needed to walk in hospital room?: A Little Help needed climbing 3-5 steps with a railing? : A Lot 6 Click Score: 17    End of Session Equipment Utilized During Treatment: Gait belt Activity Tolerance: Patient tolerated treatment well Patient left: in bed;with call bell/phone within reach Nurse Communication: Mobility status PT Visit Diagnosis: Unsteadiness on feet (R26.81);Repeated falls (R29.6);History of falling (Z91.81);Muscle weakness (generalized) (M62.81)    Time: 1036-1100 PT Time Calculation (min) (ACUTE ONLY): 24 min   Charges:   PT Evaluation $PT Eval Moderate Complexity: [place], PT, DPT  Acute Rehabilitation Services  Pager: [phone] Office: [phone]   Rudean Hitt 01/11/2019, 11:26 AM

## 2019-01-11 NOTE — ED Provider Notes (Signed)
Received patient at signout from [facility].  Refer to provider note for full history and physical examination.  Briefly, patient is a 78 year old male with history of atrial fibrillation, COPD, hyperlipidemia, hypertension, diabetes, coronary artery disease presenting for evaluation of increased falls.  He lives at home alone and family feels that they can no longer care for him safely.  Pending social work and PT OT evaluation.  MDM  PT/OT have evaluated the patient, recommends SNF placement.  Social work to coordinate.  He will stay in the department until placement has been determined. Home medicines ordered.       Renita Papa, PA-C 01/11/19 1645    Blanchie Dessert, MD 01/11/19 2048

## 2019-01-11 NOTE — TOC Initial Note (Signed)
Transition of Care [facility]) - Initial/Assessment Note    Patient Details  Name: Matthew Edwards MRN: [id number] Date of Birth: [birth year]  [facility]) CM/SW Contact:    Corinthian Mizrahi Dimitri Ped, LCSW Phone Number: 01/11/2019, 6:06 PM  Clinical Narrative:   CSW in contact with Pts daughter Adis Sturgill, who also serves as Medical POA, [place]: [phone]. CSW updated [name] on the PT recommendation for SNF. CSW asked family for insight on this recommendation.  [name] reports that pt resides at home alone with assistance from the youngest daughter,Dashay, with things such as meals, transportation to and from medical appointments, and cleaning. [name] states "im afraid of him living home alone because of the falls".   Lucina Mellow and pt agreeable to short term rehab upon discharge. Family has two preferences: [facility] and [facility]. CSW also provided family with choice list.  Pt has the following DME in the home: shower bench, shower chair, and cane.   Family is aware that pt may need additional assistance if he is to return home. During phone conservation, [name] mentioned looking into Long Term Placement as the final goal or nurse aid to assist for a couple of hours a day.  CSW will work pt up for SNF placement. CSW will continue to follow pt for any discharge needs.             Expected Discharge Plan: Skilled Nursing Facility Barriers to Discharge: SNF Pending bed offer   Patient Goals and CMS Choice Patient states their goals for this hospitalization and ongoing recovery are:: Pt is agreeable to PT recommendation of SNF placment CMS Medicare.gov Compare Post Acute Care list provided to:: Patient Represenative (must comment) Choice offered to / list presented to : Adult Children  Expected Discharge Plan and Services Expected Discharge Plan: [place] In-house Referral: Clinical Social Work   Post Acute Care Choice: Home Health, Durable Medical Equipment Living  arrangements for the past 2 months: [place]                                      Prior Living Arrangements/Services Living arrangements for the past 2 months: Single Family Home Lives with:: Self Patient language and need for interpreter reviewed:: Yes Do you feel safe going back to the place where you live?: Yes   family states that pt lives at home alone and is afraid because of his hx of falling  Need for Family Participation in Patient Care: Yes (Comment) Care giver support system in place?: No (comment)   Criminal Activity/Legal Involvement Pertinent to Current Situation/Hospitalization: No - Comment as needed  Activities of Daily Living      Permission Sought/Granted Permission sought to share information with : Case Manager, Customer service manager, Family Supports Permission granted to share information with : Yes, Verbal Permission Granted  Share Information with NAME: Traevion Poehler     Permission granted to share info w Relationship: Daughter  Permission granted to share info w Contact Information: PH: [phone]  Emotional Assessment Appearance:: Appears stated age Attitude/Demeanor/Rapport: Engaged Affect (typically observed): Accepting, Pleasant Orientation: : Oriented to Self, Oriented to Place, Oriented to  Time, Oriented to Situation   Psych Involvement: No (comment)  Admission diagnosis:  fall bloodthinner Patient Active Problem List   Diagnosis Date Noted  . Delirium 12/27/2017  . Fever 12/27/2017  . Chronic diastolic CHF (congestive heart failure) ([place]) 12/27/2017  .  Counseling and coordination of care 11/20/2017  . Cellulitis of abdominal wall 11/11/2017  . Lateral epicondylitis of right elbow 09/27/2016  . Acute pain of both knees 09/08/2016  . Chronic bilateral low back pain without sciatica 09/08/2016  . Adenomatous rectal polyp with high grade dysplasia s/p TEM resection 05/26/2016 05/26/2016  . Spinal stenosis of lumbar  region 11/28/2015  . Abnormality of gait   . Type 2 diabetes mellitus with complication, without long-term current use of insulin ([place])   . Constipation 01/28/2015  . OSA (obstructive sleep apnea) 11/13/2014  . Pulmonary HTN ([place]) 08/24/2014  . Chronic anticoagulation   . Chronic atrial fibrillation ([place])   . Acute on chronic congestive heart failure with left ventricular diastolic dysfunction ([place]) 08/02/2014  . Cognitive impairment 07/02/2014  . Abdominal distention 06/28/2014  . S/P AVR 12/29/2012  . Diabetic neuropathy ([place]) 11/25/2011  . Excessive tearing 10/06/2011  . BENIGN POSITIONAL VERTIGO 06/14/2010  . Dizziness 06/14/2010  . Vitamin D deficiency 02/21/2010  . Primary hyperparathyroidism ([place]) 06/04/2009  . Hypercalcemia 05/29/2009  . Essential hypertension 02/23/2007  . DEPRESSION 12/15/2006  . Coronary atherosclerosis 12/15/2006  . GERD 12/15/2006  . COPD (chronic obstructive pulmonary disease) ([place]) 12/10/2006  . HLD (hyperlipidemia) 07/29/2006  . Alcohol abuse 07/29/2006  . Aortic valve disorder 07/29/2006   PCP:  Donnamae Jude, MD Pharmacy:   [facility] Delivery - [address], [place] [address] Phone: [phone] Fax: [phone]     Social Determinants of Health (SDOH) Interventions    Readmission Risk Interventions No flowsheet data found.

## 2019-01-11 NOTE — ED Notes (Signed)
Pt arrives to my room awaiting on a social work consult & case management- per pt he lives at home alone and came to eD for a fall he is requesting a "better walker" to help him ambulate.

## 2019-01-12 LAB — URINE CULTURE: Culture: NO GROWTH

## 2019-01-12 LAB — SARS CORONAVIRUS 2 (TAT 6-24 HRS): SARS Coronavirus 2: NEGATIVE

## 2019-01-12 LAB — PROTIME-INR
INR: 2 — ABNORMAL HIGH (ref 0.8–1.2)
Prothrombin Time: 22.1 seconds — ABNORMAL HIGH (ref 11.4–15.2)

## 2019-01-12 NOTE — ED Notes (Signed)
Lunch tray ordered 

## 2019-01-12 NOTE — ED Notes (Signed)
Report received from [place], Therapist, sports.

## 2019-01-12 NOTE — ED Notes (Signed)
Attempted to call Pts two daughters twice. ([name] and [employer])

## 2019-01-12 NOTE — Progress Notes (Addendum)
2pm: CSW received confirmation from [facility] that this patient has been accepted at the facility. Levada Dy will obtain insurance authorization and will notify CSW as soon as it is obtained. CSW faxed patient's negative COVID results to the facility. CSW updated patient's daughter [name] as well.  11:30am: CSW received return call from patient's daughter [name] who is requesting to speak with the patient. CSW explained to [name] that there were two bed offers for the patient but that he was reporting to the nurse that he did not want to go to SNF without speaking to his daughter. CSW spoke with Levada Dy to inform her of the message and she stated she will assist the patient with the phone call.  9:50am: CSW attempted to reach patient's daughter once again via phone without success. CSW sent a text to patient's daughter in attempt to reach her.  9am: CSW attempted to reach patient's daughter [name] to provide her with information regarding bed offers for this patient, however no answer so a voicemail was left requesting a return call.  Madilyn Fireman, MSW, LCSW-A Clinical Social Worker [facility] [phone]

## 2019-01-12 NOTE — ED Notes (Signed)
Patient required two person assist to pivot onto bedside commode. Patient states he has a walker at home, but it doesn't work. CSW notified of need for new walker for patient. Multiple attempts have been made to reach daughter at home without answer. Patient also disoriented to situation - per report from previous shift, patient was told what his plan of care was, but patient has repeatedly asked and said he was told he was going home. Call light within reach, yellow socks on patient - patient instructed to use call bell when needed.

## 2019-01-12 NOTE — ED Notes (Signed)
Breakfast ordered 

## 2019-01-12 NOTE — ED Notes (Signed)
Dinner tray ordered.

## 2019-01-12 NOTE — ED Provider Notes (Addendum)
  Physical Exam  BP 104/87 (BP Location: Right Arm)   Pulse (!) 52   Temp 98.1 F (36.7 C) (Oral)   Resp 15   SpO2 95%   Physical Exam Constitutional:      Appearance: He is well-developed.  HENT:     Head: Normocephalic.     Nose: Nose normal.  Eyes:     General: Lids are normal.  Neck:     Musculoskeletal: Normal range of motion.  Cardiovascular:     Rate and Rhythm: Normal rate.     Comments: 1+ Dp pulse to RLE  Pulmonary:     Effort: Pulmonary effort is normal. No respiratory distress.  Musculoskeletal: Normal range of motion.     Comments: Small abrasion to right patella with mild edema.  Pt has full painless ROM of the knee with mild pain.  No varus/valgus laxity. No popliteal fullness. Extremity is NVI distally.   Neurological:     Mental Status: He is alert.  Psychiatric:        Behavior: Behavior normal.     ED Course/Procedures   Clinical Course as of Jan 11 1034  Thu Jan 12, 2019  1033 Pulse Rate(!): 52 [CG]  1033 BP: 104/87 [CG]  1033 INR(!): 2.0 [CG]  1033 Right knee joint effusion without acute fracture or traumatic malalignment.  Soft tissue swelling anterior and superior to the knee.  DG Knee Complete 4 Views Right [CG]    Clinical Course User Index [CG] Kinnie Feil, PA-C    Procedures  MDM    2778: Pt rounded on.  No s/s of distress. Pt is in atrial fibrillation rate controlled. ER work up reviewed and remarkable as above. There is a R knee effusion, insetting of warfarin use this could be hemarthrosis however based on exam this is unlikely. Patient arrived to ER for multiple falls and family unable to care for him at home.  Patient states he would rather go home, is afraid if he goes to a facility he will lose his home/furniture.  EMT has attempted to call both daughters without success. Madilyn Fireman CSW made aware of patient's concerns. EDP contacted SW who is currently trying to place patient at SNF. PT/OT evaluated patient recommending  SNF. Per last CSW note 0950 they have been trying to reach daughter to provide information regarding bed offers but no answer. COVID test is negative.   1135: SW has successfully contacted patient's daugher POA [name]. Pending placement.    Kinnie Feil, PA-C 01/12/19 1136    Sherwood Gambler, MD 01/12/19 [phone]

## 2019-01-13 LAB — CBG MONITORING, ED: Glucose-Capillary: 122 mg/dL — ABNORMAL HIGH (ref 70–99)

## 2019-01-13 LAB — PROTIME-INR
INR: 1.9 — ABNORMAL HIGH (ref 0.8–1.2)
Prothrombin Time: 21.7 seconds — ABNORMAL HIGH (ref 11.4–15.2)

## 2019-01-13 NOTE — ED Notes (Signed)
Pt sitting on end of bed, asking when he's going to "the new place." Pt informed that it's 3am and will be reassessed for placement in the morning. Pt expressed understanding. Assisted pt to chair then transitioned to hospital bed for comfort

## 2019-01-13 NOTE — ED Provider Notes (Signed)
Pt pending SNF placement. Has been accepted at [facility]. Awaiting insurance approval.   Pt in afib. On my evaluation HR between 110 and 120. Pt had just been given his home metoprolol, which was help yesterday due to soft blood pressures. BP stable today. Pt without CP, SOB, or palpitations.  In no acute distress.    Franchot Heidelberg, PA-C 01/13/19 1503    Hayden Rasmussen, MD 01/14/19 [phone]

## 2019-01-13 NOTE — ED Notes (Signed)
Pt CBG was 122, notified [place](RN)

## 2019-01-13 NOTE — ED Notes (Signed)
Lunch tray ordered 

## 2019-01-13 NOTE — Progress Notes (Addendum)
12pm: CSW spoke with Network engineer in the business office at [employer] to obtain update on this patient's auth, Network engineer states the Josem Kaufmann is still pending.  8am: CSW awaiting notification from [place] at [employer] regarding insurance authorization for placement.  Madilyn Fireman, MSW, LCSW-A Clinical Social Worker [facility] [place] [facility] [phone]

## 2019-01-13 NOTE — Progress Notes (Signed)
CSW received information from [place] from [facility] reporting that the insurance authorization has been approved. Levada Dy requesting d/c summary prior to receiving pt. Family updated on the status of pts placement. Josem Kaufmann #[id number]. Facility requests to receive discharge summary before 11am on tomorrow.

## 2019-01-13 NOTE — Progress Notes (Deleted)
CSW attempted to contact [name] at [phone] to discuss facilitating transportation of pt from [facility] to [facility] inpatient unit. CSW left VM requesting call back.

## 2019-01-13 NOTE — ED Notes (Signed)
This RN reached out to pt's daughter, [name] regarding pt placement. Daughter states she is okay with pt going to [place] rehab facility. Will contact SW.

## 2019-01-14 DIAGNOSIS — R609 Edema, unspecified: Secondary | ICD-10-CM | POA: Diagnosis not present

## 2019-01-14 DIAGNOSIS — Z952 Presence of prosthetic heart valve: Secondary | ICD-10-CM | POA: Diagnosis not present

## 2019-01-14 DIAGNOSIS — I4891 Unspecified atrial fibrillation: Secondary | ICD-10-CM | POA: Diagnosis not present

## 2019-01-14 DIAGNOSIS — Z20828 Contact with and (suspected) exposure to other viral communicable diseases: Secondary | ICD-10-CM | POA: Diagnosis not present

## 2019-01-14 DIAGNOSIS — Z7984 Long term (current) use of oral hypoglycemic drugs: Secondary | ICD-10-CM | POA: Diagnosis not present

## 2019-01-14 DIAGNOSIS — R279 Unspecified lack of coordination: Secondary | ICD-10-CM | POA: Diagnosis not present

## 2019-01-14 DIAGNOSIS — M25561 Pain in right knee: Secondary | ICD-10-CM | POA: Diagnosis not present

## 2019-01-14 DIAGNOSIS — E785 Hyperlipidemia, unspecified: Secondary | ICD-10-CM | POA: Diagnosis not present

## 2019-01-14 DIAGNOSIS — I482 Chronic atrial fibrillation, unspecified: Secondary | ICD-10-CM | POA: Diagnosis not present

## 2019-01-14 DIAGNOSIS — R58 Hemorrhage, not elsewhere classified: Secondary | ICD-10-CM | POA: Diagnosis not present

## 2019-01-14 DIAGNOSIS — E119 Type 2 diabetes mellitus without complications: Secondary | ICD-10-CM | POA: Diagnosis not present

## 2019-01-14 DIAGNOSIS — I10 Essential (primary) hypertension: Secondary | ICD-10-CM | POA: Diagnosis not present

## 2019-01-14 DIAGNOSIS — W19XXXD Unspecified fall, subsequent encounter: Secondary | ICD-10-CM | POA: Diagnosis not present

## 2019-01-14 DIAGNOSIS — J449 Chronic obstructive pulmonary disease, unspecified: Secondary | ICD-10-CM | POA: Diagnosis not present

## 2019-01-14 DIAGNOSIS — Z743 Need for continuous supervision: Secondary | ICD-10-CM | POA: Diagnosis not present

## 2019-01-14 DIAGNOSIS — F0390 Unspecified dementia without behavioral disturbance: Secondary | ICD-10-CM | POA: Diagnosis not present

## 2019-01-14 DIAGNOSIS — R6889 Other general symptoms and signs: Secondary | ICD-10-CM | POA: Diagnosis not present

## 2019-01-14 DIAGNOSIS — D649 Anemia, unspecified: Secondary | ICD-10-CM | POA: Diagnosis not present

## 2019-01-14 DIAGNOSIS — I2510 Atherosclerotic heart disease of native coronary artery without angina pectoris: Secondary | ICD-10-CM | POA: Diagnosis not present

## 2019-01-14 DIAGNOSIS — Z79899 Other long term (current) drug therapy: Secondary | ICD-10-CM | POA: Diagnosis not present

## 2019-01-14 DIAGNOSIS — I5030 Unspecified diastolic (congestive) heart failure: Secondary | ICD-10-CM | POA: Diagnosis not present

## 2019-01-14 DIAGNOSIS — R269 Unspecified abnormalities of gait and mobility: Secondary | ICD-10-CM | POA: Diagnosis not present

## 2019-01-14 DIAGNOSIS — Z7901 Long term (current) use of anticoagulants: Secondary | ICD-10-CM | POA: Diagnosis not present

## 2019-01-14 DIAGNOSIS — D6869 Other thrombophilia: Secondary | ICD-10-CM | POA: Diagnosis not present

## 2019-01-14 DIAGNOSIS — R5381 Other malaise: Secondary | ICD-10-CM | POA: Diagnosis not present

## 2019-01-14 DIAGNOSIS — G4733 Obstructive sleep apnea (adult) (pediatric): Secondary | ICD-10-CM | POA: Diagnosis not present

## 2019-01-14 LAB — PROTIME-INR
INR: 2.7 — ABNORMAL HIGH (ref 0.8–1.2)
Prothrombin Time: 27.9 seconds — ABNORMAL HIGH (ref 11.4–15.2)

## 2019-01-14 NOTE — ED Notes (Signed)
PTAR at bedside 

## 2019-01-14 NOTE — Care Management (Signed)
ED CM faxed AVS to [place] [facility] Intake Coodinator at [facility].

## 2019-01-14 NOTE — ED Notes (Signed)
Ordered bfast 

## 2019-01-14 NOTE — ED Notes (Signed)
Patient is discharged in good condition with all belongings with pt or PTAR

## 2019-01-14 NOTE — Progress Notes (Signed)
Patient will discharge too: [facility] Discharge date: 01/14/19 Family notified: CSW left HIPPA-compliant voicemail with patient's daughter. Transport by: Corey Harold  Per MD patient is appropriate for discharge and will discharge too [facility] in room 311B. RN, patient, patient's family, and facility have been notified of discharge. Assessment, FL-2, PASRR, and discharge summary sent to facillity. RN was provided with the following number for report: [phone].   Lamonte Richer, LCSW, [place] Worker II [phone]

## 2019-01-14 NOTE — ED Notes (Signed)
[place] for Report. Waiting on PTAR for transport. Pt made aware.

## 2019-01-15 LAB — CULTURE, BLOOD (ROUTINE X 2): Culture: NO GROWTH

## 2019-01-16 LAB — CULTURE, BLOOD (ROUTINE X 2)
Culture: NO GROWTH
Special Requests: ADEQUATE

## 2019-01-17 ENCOUNTER — Ambulatory Visit: Payer: Medicare HMO | Admitting: Family Medicine

## 2019-01-26 DIAGNOSIS — J449 Chronic obstructive pulmonary disease, unspecified: Secondary | ICD-10-CM | POA: Diagnosis not present

## 2019-01-26 DIAGNOSIS — Z952 Presence of prosthetic heart valve: Secondary | ICD-10-CM | POA: Diagnosis not present

## 2019-01-26 DIAGNOSIS — I4891 Unspecified atrial fibrillation: Secondary | ICD-10-CM | POA: Diagnosis not present

## 2019-01-26 DIAGNOSIS — D6869 Other thrombophilia: Secondary | ICD-10-CM | POA: Diagnosis not present

## 2019-01-26 DIAGNOSIS — G4733 Obstructive sleep apnea (adult) (pediatric): Secondary | ICD-10-CM | POA: Diagnosis not present

## 2019-01-26 DIAGNOSIS — I2510 Atherosclerotic heart disease of native coronary artery without angina pectoris: Secondary | ICD-10-CM | POA: Diagnosis not present

## 2019-01-26 DIAGNOSIS — I5030 Unspecified diastolic (congestive) heart failure: Secondary | ICD-10-CM | POA: Diagnosis not present

## 2019-01-26 DIAGNOSIS — R269 Unspecified abnormalities of gait and mobility: Secondary | ICD-10-CM | POA: Diagnosis not present

## 2019-01-26 DIAGNOSIS — F0390 Unspecified dementia without behavioral disturbance: Secondary | ICD-10-CM | POA: Diagnosis not present

## 2019-01-30 DIAGNOSIS — M48061 Spinal stenosis, lumbar region without neurogenic claudication: Secondary | ICD-10-CM | POA: Diagnosis not present

## 2019-01-30 DIAGNOSIS — I482 Chronic atrial fibrillation, unspecified: Secondary | ICD-10-CM | POA: Diagnosis not present

## 2019-01-30 DIAGNOSIS — E1140 Type 2 diabetes mellitus with diabetic neuropathy, unspecified: Secondary | ICD-10-CM | POA: Diagnosis not present

## 2019-01-30 DIAGNOSIS — J449 Chronic obstructive pulmonary disease, unspecified: Secondary | ICD-10-CM | POA: Diagnosis not present

## 2019-01-30 DIAGNOSIS — M1990 Unspecified osteoarthritis, unspecified site: Secondary | ICD-10-CM | POA: Diagnosis not present

## 2019-01-30 DIAGNOSIS — I2510 Atherosclerotic heart disease of native coronary artery without angina pectoris: Secondary | ICD-10-CM | POA: Diagnosis not present

## 2019-01-30 DIAGNOSIS — I110 Hypertensive heart disease with heart failure: Secondary | ICD-10-CM | POA: Diagnosis not present

## 2019-01-30 DIAGNOSIS — F0390 Unspecified dementia without behavioral disturbance: Secondary | ICD-10-CM | POA: Diagnosis not present

## 2019-01-30 DIAGNOSIS — I5032 Chronic diastolic (congestive) heart failure: Secondary | ICD-10-CM | POA: Diagnosis not present

## 2019-01-31 ENCOUNTER — Other Ambulatory Visit: Payer: Self-pay | Admitting: *Deleted

## 2019-01-31 DIAGNOSIS — M48061 Spinal stenosis, lumbar region without neurogenic claudication: Secondary | ICD-10-CM | POA: Diagnosis not present

## 2019-01-31 DIAGNOSIS — M1990 Unspecified osteoarthritis, unspecified site: Secondary | ICD-10-CM | POA: Diagnosis not present

## 2019-01-31 DIAGNOSIS — I5032 Chronic diastolic (congestive) heart failure: Secondary | ICD-10-CM | POA: Diagnosis not present

## 2019-01-31 DIAGNOSIS — J449 Chronic obstructive pulmonary disease, unspecified: Secondary | ICD-10-CM | POA: Diagnosis not present

## 2019-01-31 DIAGNOSIS — I2510 Atherosclerotic heart disease of native coronary artery without angina pectoris: Secondary | ICD-10-CM | POA: Diagnosis not present

## 2019-01-31 DIAGNOSIS — E1140 Type 2 diabetes mellitus with diabetic neuropathy, unspecified: Secondary | ICD-10-CM | POA: Diagnosis not present

## 2019-01-31 DIAGNOSIS — F0390 Unspecified dementia without behavioral disturbance: Secondary | ICD-10-CM | POA: Diagnosis not present

## 2019-01-31 DIAGNOSIS — I482 Chronic atrial fibrillation, unspecified: Secondary | ICD-10-CM | POA: Diagnosis not present

## 2019-01-31 DIAGNOSIS — I110 Hypertensive heart disease with heart failure: Secondary | ICD-10-CM | POA: Diagnosis not present

## 2019-01-31 NOTE — Patient Outreach (Signed)
[facility] [facility]) Care Management  01/31/2019  Matthew Edwards [birth year] [id number]    Referral Received 01/31/2019 Initial Outreach 01/31/2019  Transition of care-Declined   RN spoke with pt's daughter due to pt's Dementia and outburst with receiving calls. Daughter Matthew Edwards [phone] primary caregiver was able to verified all information and pt identifiers. Daughter reports pt's condition and pending services with [facility] for PT services awaiting to start. Several topics discussed related to pt's needs.   PT/[facility]-States pt is weak and has issues with balance control (needs PT services). [facility] ordered for PT services. Strongly encouraged caregiver to discuss her request and/or issues with the visiting therapist who will be working with the pt. Also inquired on a shower chair as RN encouraged this request be presented to [facility] for DME for availability. Also mentioned OTC purchase at the local drug stores if available.  Medications-Reports she fills up an existiing pill box however requested new pill boxes if available. RN will request a mail out of 2 pill boxes for ongoing management of care related to pt's daily medications administration.  Transition of care template completed with no acute needs. RN continue to offer [facility] services to follow pt for fall prevention measures over the next few weeks however daughter indicated as long as she has RN case manager's contact she would call with any needs however at this time no needs. Opt to decline weekly and/or monthly calls to follow up however appreciative. Requested a magnet as RN will request a mail-out THN packet and two medication boxes as discussed above for organizing pt's daily administration with this ongoing caregiver to support managing pt's ongoing care. RN strongly encouraged caregiver that  [facility] RN case manager would be available to assist with any community resources as [facility] has pharmacy and social work services  available if needed at no cost to pt under his current [employer].   Verified caregiver has RN contact number and offered the 24 hr nurse hotline contact if needed. No other issues to address at this time. Case closed and primary provider will be updated on pt's disposition with [facility] services.  Raina Mina, RN Care Management Coordinator [place] Office [phone]

## 2019-02-02 DIAGNOSIS — M1990 Unspecified osteoarthritis, unspecified site: Secondary | ICD-10-CM | POA: Diagnosis not present

## 2019-02-02 DIAGNOSIS — F0390 Unspecified dementia without behavioral disturbance: Secondary | ICD-10-CM | POA: Diagnosis not present

## 2019-02-02 DIAGNOSIS — I5032 Chronic diastolic (congestive) heart failure: Secondary | ICD-10-CM | POA: Diagnosis not present

## 2019-02-02 DIAGNOSIS — M48061 Spinal stenosis, lumbar region without neurogenic claudication: Secondary | ICD-10-CM | POA: Diagnosis not present

## 2019-02-02 DIAGNOSIS — I482 Chronic atrial fibrillation, unspecified: Secondary | ICD-10-CM | POA: Diagnosis not present

## 2019-02-02 DIAGNOSIS — J449 Chronic obstructive pulmonary disease, unspecified: Secondary | ICD-10-CM | POA: Diagnosis not present

## 2019-02-02 DIAGNOSIS — I2510 Atherosclerotic heart disease of native coronary artery without angina pectoris: Secondary | ICD-10-CM | POA: Diagnosis not present

## 2019-02-02 DIAGNOSIS — I110 Hypertensive heart disease with heart failure: Secondary | ICD-10-CM | POA: Diagnosis not present

## 2019-02-02 DIAGNOSIS — E1140 Type 2 diabetes mellitus with diabetic neuropathy, unspecified: Secondary | ICD-10-CM | POA: Diagnosis not present

## 2019-02-07 DIAGNOSIS — E1140 Type 2 diabetes mellitus with diabetic neuropathy, unspecified: Secondary | ICD-10-CM | POA: Diagnosis not present

## 2019-02-07 DIAGNOSIS — F0390 Unspecified dementia without behavioral disturbance: Secondary | ICD-10-CM | POA: Diagnosis not present

## 2019-02-07 DIAGNOSIS — I2510 Atherosclerotic heart disease of native coronary artery without angina pectoris: Secondary | ICD-10-CM | POA: Diagnosis not present

## 2019-02-07 DIAGNOSIS — I482 Chronic atrial fibrillation, unspecified: Secondary | ICD-10-CM | POA: Diagnosis not present

## 2019-02-07 DIAGNOSIS — M1990 Unspecified osteoarthritis, unspecified site: Secondary | ICD-10-CM | POA: Diagnosis not present

## 2019-02-07 DIAGNOSIS — J449 Chronic obstructive pulmonary disease, unspecified: Secondary | ICD-10-CM | POA: Diagnosis not present

## 2019-02-07 DIAGNOSIS — I110 Hypertensive heart disease with heart failure: Secondary | ICD-10-CM | POA: Diagnosis not present

## 2019-02-07 DIAGNOSIS — M48061 Spinal stenosis, lumbar region without neurogenic claudication: Secondary | ICD-10-CM | POA: Diagnosis not present

## 2019-02-07 DIAGNOSIS — I5032 Chronic diastolic (congestive) heart failure: Secondary | ICD-10-CM | POA: Diagnosis not present

## 2019-02-09 DIAGNOSIS — I5032 Chronic diastolic (congestive) heart failure: Secondary | ICD-10-CM | POA: Diagnosis not present

## 2019-02-09 DIAGNOSIS — I482 Chronic atrial fibrillation, unspecified: Secondary | ICD-10-CM | POA: Diagnosis not present

## 2019-02-09 DIAGNOSIS — M48061 Spinal stenosis, lumbar region without neurogenic claudication: Secondary | ICD-10-CM | POA: Diagnosis not present

## 2019-02-09 DIAGNOSIS — J449 Chronic obstructive pulmonary disease, unspecified: Secondary | ICD-10-CM | POA: Diagnosis not present

## 2019-02-09 DIAGNOSIS — M1990 Unspecified osteoarthritis, unspecified site: Secondary | ICD-10-CM | POA: Diagnosis not present

## 2019-02-09 DIAGNOSIS — I110 Hypertensive heart disease with heart failure: Secondary | ICD-10-CM | POA: Diagnosis not present

## 2019-02-09 DIAGNOSIS — E1140 Type 2 diabetes mellitus with diabetic neuropathy, unspecified: Secondary | ICD-10-CM | POA: Diagnosis not present

## 2019-02-09 DIAGNOSIS — I2510 Atherosclerotic heart disease of native coronary artery without angina pectoris: Secondary | ICD-10-CM | POA: Diagnosis not present

## 2019-02-09 DIAGNOSIS — F0390 Unspecified dementia without behavioral disturbance: Secondary | ICD-10-CM | POA: Diagnosis not present

## 2019-02-10 ENCOUNTER — Other Ambulatory Visit: Payer: Self-pay

## 2019-02-10 ENCOUNTER — Encounter: Payer: Self-pay | Admitting: Family Medicine

## 2019-02-10 ENCOUNTER — Ambulatory Visit (INDEPENDENT_AMBULATORY_CARE_PROVIDER_SITE_OTHER): Payer: Medicare HMO | Admitting: Family Medicine

## 2019-02-10 VITALS — BP 128/64 | HR 47 | Wt 232.0 lb

## 2019-02-10 DIAGNOSIS — I10 Essential (primary) hypertension: Secondary | ICD-10-CM

## 2019-02-10 DIAGNOSIS — I482 Chronic atrial fibrillation, unspecified: Secondary | ICD-10-CM

## 2019-02-10 DIAGNOSIS — H6123 Impacted cerumen, bilateral: Secondary | ICD-10-CM | POA: Diagnosis not present

## 2019-02-10 DIAGNOSIS — E118 Type 2 diabetes mellitus with unspecified complications: Secondary | ICD-10-CM | POA: Diagnosis not present

## 2019-02-10 DIAGNOSIS — Z23 Encounter for immunization: Secondary | ICD-10-CM | POA: Diagnosis not present

## 2019-02-10 LAB — POCT GLYCOSYLATED HEMOGLOBIN (HGB A1C): HbA1c, POC (controlled diabetic range): 6.8 % (ref 0.0–7.0)

## 2019-02-10 LAB — POCT INR: INR: 1.5 — AB (ref 2.0–3.0)

## 2019-02-10 NOTE — Assessment & Plan Note (Signed)
Continue Coumadin--adjust meds prn on results

## 2019-02-10 NOTE — Assessment & Plan Note (Signed)
Continue Glucotrol

## 2019-02-10 NOTE — Patient Instructions (Signed)
Earwax Buildup, Adult The ears produce a substance called earwax that helps keep bacteria out of the ear and protects the skin in the ear canal. Occasionally, earwax can build up in the ear and cause discomfort or hearing loss. What increases the risk? This condition is more likely to develop in people who:  Are male.  Are elderly.  Naturally produce more earwax.  Clean their ears often with cotton swabs.  Use earplugs often.  Use in-ear headphones often.  Wear hearing aids.  Have narrow ear canals.  Have earwax that is overly thick or sticky.  Have eczema.  Are dehydrated.  Have excess hair in the ear canal. What are the signs or symptoms? Symptoms of this condition include:  Reduced or muffled hearing.  A feeling of fullness in the ear or feeling that the ear is plugged.  Fluid coming from the ear.  Ear pain.  Ear itch.  Ringing in the ear.  Coughing.  An obvious piece of earwax that can be seen inside the ear canal. How is this diagnosed? This condition may be diagnosed based on:  Your symptoms.  Your medical history.  An ear exam. During the exam, your health care provider will look into your ear with an instrument called an otoscope. You may have tests, including a hearing test. How is this treated? This condition may be treated by:  Using ear drops to soften the earwax.  Having the earwax removed by a health care provider. The health care provider may: ? Flush the ear with water. ? Use an instrument that has a loop on the end (curette). ? Use a suction device.  Surgery to remove the wax buildup. This may be done in severe cases. Follow these instructions at home:   Take over-the-counter and prescription medicines only as told by your health care provider.  Do not put any objects, including cotton swabs, into your ear. You can clean the opening of your ear canal with a washcloth or facial tissue.  Follow instructions from your health care  provider about cleaning your ears. Do not over-clean your ears.  Drink enough fluid to keep your urine clear or pale yellow. This will help to thin the earwax.  Keep all follow-up visits as told by your health care provider. If earwax builds up in your ears often or if you use hearing aids, consider seeing your health care provider for routine, preventive ear cleanings. Ask your health care provider how often you should schedule your cleanings.  If you have hearing aids, clean them according to instructions from the manufacturer and your health care provider. Contact a health care provider if:  You have ear pain.  You develop a fever.  You have blood, pus, or other fluid coming from your ear.  You have hearing loss.  You have ringing in your ears that does not go away.  Your symptoms do not improve with treatment.  You feel like the room is spinning (vertigo). Summary  Earwax can build up in the ear and cause discomfort or hearing loss.  The most common symptoms of this condition include reduced or muffled hearing and a feeling of fullness in the ear or feeling that the ear is plugged.  This condition may be diagnosed based on your symptoms, your medical history, and an ear exam.  This condition may be treated by using ear drops to soften the earwax or by having the earwax removed by a health care provider.  Do not put any   objects, including cotton swabs, into your ear. You can clean the opening of your ear canal with a washcloth or facial tissue. This information is not intended to replace advice given to you by your health care provider. Make sure you discuss any questions you have with your health care provider. Document Released: 06/25/2004 Document Revised: 04/30/2017 Document Reviewed: 07/29/2016 Elsevier Patient Education  2020 Elsevier Inc.  

## 2019-02-10 NOTE — Assessment & Plan Note (Signed)
Continue Lotensin, Toprol

## 2019-02-10 NOTE — Progress Notes (Signed)
   Subjective:    Patient ID: KENNIE SNEDDEN is a 78 y.o. male presenting with Hospitalization Follow-up  on 02/10/2019  HPI: Martin [place] to [place] and notes his ear has bothered him since then July fourth. In the nursing home now. Brought in today by son-in-law, spoke with daughter on the phone. He otherwise feels well. Needs A1C and INR checked today.  Review of Systems  Constitutional: Negative for chills and fever.  Respiratory: Negative for shortness of breath.   Cardiovascular: Negative for leg swelling.  Gastrointestinal: Negative for abdominal pain, nausea and vomiting.      Objective:    BP 128/64   Pulse (!) 47   Wt 232 lb (105.2 kg)   SpO2 97%   BMI 34.26 kg/m  Physical Exam Vitals signs reviewed.  Constitutional:      General: He is not in acute distress.    Appearance: He is well-developed.  HENT:     Head: Normocephalic and atraumatic.     Right Ear: There is impacted cerumen.     Left Ear: There is impacted cerumen.  Eyes:     General: No scleral icterus.    Conjunctiva/sclera: Conjunctivae normal.  Neck:     Musculoskeletal: Neck supple.     Thyroid: No thyromegaly.  Cardiovascular:     Rate and Rhythm: Normal rate and regular rhythm.     Heart sounds: No murmur.  Pulmonary:     Effort: Pulmonary effort is normal.     Breath sounds: Normal breath sounds. No wheezing.  Abdominal:     Palpations: Abdomen is soft. There is no mass.     Tenderness: There is no abdominal tenderness.  Musculoskeletal: Normal range of motion.  Skin:    General: Skin is warm and dry.     Findings: No rash.  Neurological:     Mental Status: He is alert and oriented to person, place, and time.  Psychiatric:        Behavior: Behavior normal.    INR 1.5 Abnormal      HbA1c, POC (controlled diabetic range) 6.8         Assessment & Plan:   Problem List Items Addressed This Visit      Unprioritized   Essential hypertension (Chronic)    Continue Lotensin, Toprol       Chronic atrial fibrillation (HCC) (Chronic)    Continue Coumadin--adjust meds prn on results      Relevant Orders   POCT INR (Completed)   Type 2 diabetes mellitus with complication, without long-term current use of insulin (HCC) - Primary (Chronic)    Continue Glucotrol      Relevant Orders   HgB A1c (Completed)    Other Visit Diagnoses    Need for immunization against influenza       Relevant Orders   Flu Vaccine QUAD 36+ mos IM (Completed)   Bilateral impacted cerumen       releived today      Total face-to-face time with patient: 25 minutes. Over 50% of encounter was spent on counseling and coordination of care. Return in about 6 weeks (around 03/24/2019).  Donnamae Jude 02/10/2019 5:05 PM

## 2019-02-14 DIAGNOSIS — I2510 Atherosclerotic heart disease of native coronary artery without angina pectoris: Secondary | ICD-10-CM | POA: Diagnosis not present

## 2019-02-14 DIAGNOSIS — E1140 Type 2 diabetes mellitus with diabetic neuropathy, unspecified: Secondary | ICD-10-CM | POA: Diagnosis not present

## 2019-02-14 DIAGNOSIS — I110 Hypertensive heart disease with heart failure: Secondary | ICD-10-CM | POA: Diagnosis not present

## 2019-02-14 DIAGNOSIS — J449 Chronic obstructive pulmonary disease, unspecified: Secondary | ICD-10-CM | POA: Diagnosis not present

## 2019-02-14 DIAGNOSIS — F0390 Unspecified dementia without behavioral disturbance: Secondary | ICD-10-CM | POA: Diagnosis not present

## 2019-02-14 DIAGNOSIS — M1990 Unspecified osteoarthritis, unspecified site: Secondary | ICD-10-CM | POA: Diagnosis not present

## 2019-02-14 DIAGNOSIS — I482 Chronic atrial fibrillation, unspecified: Secondary | ICD-10-CM | POA: Diagnosis not present

## 2019-02-14 DIAGNOSIS — I5032 Chronic diastolic (congestive) heart failure: Secondary | ICD-10-CM | POA: Diagnosis not present

## 2019-02-14 DIAGNOSIS — M48061 Spinal stenosis, lumbar region without neurogenic claudication: Secondary | ICD-10-CM | POA: Diagnosis not present

## 2019-02-15 DIAGNOSIS — I2510 Atherosclerotic heart disease of native coronary artery without angina pectoris: Secondary | ICD-10-CM | POA: Diagnosis not present

## 2019-02-15 DIAGNOSIS — J449 Chronic obstructive pulmonary disease, unspecified: Secondary | ICD-10-CM | POA: Diagnosis not present

## 2019-02-15 DIAGNOSIS — M1990 Unspecified osteoarthritis, unspecified site: Secondary | ICD-10-CM | POA: Diagnosis not present

## 2019-02-15 DIAGNOSIS — E1140 Type 2 diabetes mellitus with diabetic neuropathy, unspecified: Secondary | ICD-10-CM | POA: Diagnosis not present

## 2019-02-15 DIAGNOSIS — I482 Chronic atrial fibrillation, unspecified: Secondary | ICD-10-CM | POA: Diagnosis not present

## 2019-02-15 DIAGNOSIS — M48061 Spinal stenosis, lumbar region without neurogenic claudication: Secondary | ICD-10-CM | POA: Diagnosis not present

## 2019-02-15 DIAGNOSIS — I110 Hypertensive heart disease with heart failure: Secondary | ICD-10-CM | POA: Diagnosis not present

## 2019-02-15 DIAGNOSIS — F0390 Unspecified dementia without behavioral disturbance: Secondary | ICD-10-CM | POA: Diagnosis not present

## 2019-02-15 DIAGNOSIS — I5032 Chronic diastolic (congestive) heart failure: Secondary | ICD-10-CM | POA: Diagnosis not present

## 2019-02-17 DIAGNOSIS — M48061 Spinal stenosis, lumbar region without neurogenic claudication: Secondary | ICD-10-CM | POA: Diagnosis not present

## 2019-02-17 DIAGNOSIS — F0390 Unspecified dementia without behavioral disturbance: Secondary | ICD-10-CM | POA: Diagnosis not present

## 2019-02-17 DIAGNOSIS — J449 Chronic obstructive pulmonary disease, unspecified: Secondary | ICD-10-CM | POA: Diagnosis not present

## 2019-02-17 DIAGNOSIS — M1990 Unspecified osteoarthritis, unspecified site: Secondary | ICD-10-CM | POA: Diagnosis not present

## 2019-02-17 DIAGNOSIS — I482 Chronic atrial fibrillation, unspecified: Secondary | ICD-10-CM | POA: Diagnosis not present

## 2019-02-17 DIAGNOSIS — I5032 Chronic diastolic (congestive) heart failure: Secondary | ICD-10-CM | POA: Diagnosis not present

## 2019-02-17 DIAGNOSIS — I2510 Atherosclerotic heart disease of native coronary artery without angina pectoris: Secondary | ICD-10-CM | POA: Diagnosis not present

## 2019-02-17 DIAGNOSIS — E1140 Type 2 diabetes mellitus with diabetic neuropathy, unspecified: Secondary | ICD-10-CM | POA: Diagnosis not present

## 2019-02-17 DIAGNOSIS — I110 Hypertensive heart disease with heart failure: Secondary | ICD-10-CM | POA: Diagnosis not present

## 2019-02-28 DIAGNOSIS — E1140 Type 2 diabetes mellitus with diabetic neuropathy, unspecified: Secondary | ICD-10-CM | POA: Diagnosis not present

## 2019-02-28 DIAGNOSIS — F0390 Unspecified dementia without behavioral disturbance: Secondary | ICD-10-CM | POA: Diagnosis not present

## 2019-02-28 DIAGNOSIS — I110 Hypertensive heart disease with heart failure: Secondary | ICD-10-CM | POA: Diagnosis not present

## 2019-02-28 DIAGNOSIS — I2510 Atherosclerotic heart disease of native coronary artery without angina pectoris: Secondary | ICD-10-CM | POA: Diagnosis not present

## 2019-02-28 DIAGNOSIS — M48061 Spinal stenosis, lumbar region without neurogenic claudication: Secondary | ICD-10-CM | POA: Diagnosis not present

## 2019-02-28 DIAGNOSIS — J449 Chronic obstructive pulmonary disease, unspecified: Secondary | ICD-10-CM | POA: Diagnosis not present

## 2019-02-28 DIAGNOSIS — I5032 Chronic diastolic (congestive) heart failure: Secondary | ICD-10-CM | POA: Diagnosis not present

## 2019-02-28 DIAGNOSIS — I482 Chronic atrial fibrillation, unspecified: Secondary | ICD-10-CM | POA: Diagnosis not present

## 2019-02-28 DIAGNOSIS — M1990 Unspecified osteoarthritis, unspecified site: Secondary | ICD-10-CM | POA: Diagnosis not present

## 2019-03-22 ENCOUNTER — Other Ambulatory Visit: Payer: Self-pay

## 2019-03-23 ENCOUNTER — Other Ambulatory Visit: Payer: Self-pay

## 2019-03-23 ENCOUNTER — Encounter: Payer: Self-pay | Admitting: Family Medicine

## 2019-03-23 ENCOUNTER — Ambulatory Visit (INDEPENDENT_AMBULATORY_CARE_PROVIDER_SITE_OTHER): Payer: Medicare HMO | Admitting: Family Medicine

## 2019-03-23 DIAGNOSIS — J42 Unspecified chronic bronchitis: Secondary | ICD-10-CM | POA: Diagnosis not present

## 2019-03-23 DIAGNOSIS — I10 Essential (primary) hypertension: Secondary | ICD-10-CM

## 2019-03-23 DIAGNOSIS — E559 Vitamin D deficiency, unspecified: Secondary | ICD-10-CM | POA: Diagnosis not present

## 2019-03-23 DIAGNOSIS — E0843 Diabetes mellitus due to underlying condition with diabetic autonomic (poly)neuropathy: Secondary | ICD-10-CM

## 2019-03-23 DIAGNOSIS — I482 Chronic atrial fibrillation, unspecified: Secondary | ICD-10-CM | POA: Diagnosis not present

## 2019-03-23 DIAGNOSIS — E1143 Type 2 diabetes mellitus with diabetic autonomic (poly)neuropathy: Secondary | ICD-10-CM | POA: Diagnosis not present

## 2019-03-23 DIAGNOSIS — I2510 Atherosclerotic heart disease of native coronary artery without angina pectoris: Secondary | ICD-10-CM | POA: Diagnosis not present

## 2019-03-23 LAB — POCT INR: INR: 2.4 (ref 2.0–3.0)

## 2019-03-23 MED ORDER — BUDESONIDE-FORMOTEROL FUMARATE 80-4.5 MCG/ACT IN AERO: 2.0000 | INHALATION_SPRAY | Freq: Two times a day (BID) | RESPIRATORY_TRACT | 11 refills | Status: DC

## 2019-03-23 MED ORDER — ATORVASTATIN CALCIUM 40 MG PO TABS: 40.0000 mg | ORAL_TABLET | Freq: Every day | ORAL | 3 refills | Status: DC

## 2019-03-23 MED ORDER — METOPROLOL SUCCINATE ER 25 MG PO TB24: 25.0000 mg | ORAL_TABLET | Freq: Every day | ORAL | 3 refills | Status: DC

## 2019-03-23 MED ORDER — GLIPIZIDE 10 MG PO TABS: 10.0000 mg | ORAL_TABLET | Freq: Every evening | ORAL | 3 refills | Status: DC

## 2019-03-23 MED ORDER — VITAMIN C 500 MG PO TABS: 500.0000 mg | ORAL_TABLET | Freq: Every morning | ORAL | 3 refills | Status: DC

## 2019-03-23 MED ORDER — NITROGLYCERIN 0.4 MG SL SUBL: 0.4000 mg | SUBLINGUAL_TABLET | SUBLINGUAL | 6 refills | Status: DC | PRN

## 2019-03-23 MED ORDER — WARFARIN SODIUM 5 MG PO TABS: ORAL_TABLET | ORAL | 3 refills | Status: DC

## 2019-03-23 MED ORDER — BENAZEPRIL HCL 20 MG PO TABS: 20.0000 mg | ORAL_TABLET | Freq: Every day | ORAL | 3 refills | Status: DC

## 2019-03-23 MED ORDER — FUROSEMIDE 40 MG PO TABS: 40.0000 mg | ORAL_TABLET | Freq: Two times a day (BID) | ORAL | 3 refills | Status: DC

## 2019-03-23 MED ORDER — POTASSIUM CHLORIDE CRYS ER 20 MEQ PO TBCR: 20.0000 meq | EXTENDED_RELEASE_TABLET | Freq: Every day | ORAL | 3 refills | Status: DC

## 2019-03-23 NOTE — Progress Notes (Signed)
Subjective:    Patient ID: ESCO JOSLYN is a 78 y.o. male presenting with Diabetes  on 03/23/2019  HPI: Here today for f/u. Doing well. No complaints Accompanied by his daughter who is also his care giver. Using walker at home. No falls. Able to dress self. She is mostly doing th cooking nad dispensing his meds.  Review of Systems  Constitutional: Negative for chills and fever.  Respiratory: Negative for shortness of breath.   Cardiovascular: Negative for leg swelling.  Gastrointestinal: Negative for abdominal pain, nausea and vomiting.      Objective:    BP 118/68   Pulse 63   Ht 5\' 9"  (1.753 m)   Wt 230 lb (104.3 kg)   SpO2 93%   BMI 33.97 kg/m  Physical Exam Vitals signs reviewed.  Constitutional:      General: He is not in acute distress.    Appearance: He is well-developed.  HENT:     Head: Normocephalic and atraumatic.  Eyes:     General: No scleral icterus. Neck:     Musculoskeletal: Neck supple.  Cardiovascular:     Rate and Rhythm: Normal rate. Rhythm irregular.     Heart sounds: No murmur.  Pulmonary:     Effort: Pulmonary effort is normal.     Breath sounds: Normal breath sounds.  Abdominal:     Palpations: Abdomen is soft.  Skin:    General: Skin is warm.  Neurological:     Mental Status: He is alert.         Assessment & Plan:   Problem List Items Addressed This Visit      Unprioritized   Essential hypertension (Chronic)    Stable, BP is well controlled on Lotensin and Toprol, will continue these.      Relevant Medications   benazepril (LOTENSIN) 20 MG tablet   metoprolol succinate (TOPROL-XL) 25 MG 24 hr tablet   warfarin (COUMADIN) 5 MG tablet   atorvastatin (LIPITOR) 40 MG tablet   furosemide (LASIX) 40 MG tablet   nitroGLYCERIN (NITROSTAT) 0.4 MG SL tablet   potassium chloride SA (KLOR-CON) 20 MEQ tablet   Chronic atrial fibrillation (HCC) (Chronic)    On coumadin--level is 2.4 today--continue current dose      Relevant  Medications   benazepril (LOTENSIN) 20 MG tablet   metoprolol succinate (TOPROL-XL) 25 MG 24 hr tablet   warfarin (COUMADIN) 5 MG tablet   atorvastatin (LIPITOR) 40 MG tablet   furosemide (LASIX) 40 MG tablet   nitroGLYCERIN (NITROSTAT) 0.4 MG SL tablet   Vitamin D deficiency   Relevant Medications   vitamin C (ASCORBIC ACID) 500 MG tablet   Coronary atherosclerosis    Doing well - no chest pain. Continue Atorvastatin, BP meds, NTG prn      Relevant Medications   benazepril (LOTENSIN) 20 MG tablet   metoprolol succinate (TOPROL-XL) 25 MG 24 hr tablet   warfarin (COUMADIN) 5 MG tablet   atorvastatin (LIPITOR) 40 MG tablet   furosemide (LASIX) 40 MG tablet   nitroGLYCERIN (NITROSTAT) 0.4 MG SL tablet   COPD (chronic obstructive pulmonary disease) (HCC)    Stable Continue Symbicort      Relevant Medications   budesonide-formoterol (SYMBICORT) 80-4.5 MCG/ACT inhaler   Diabetic neuropathy (HCC)    Stable, Last A1C was 6.8 last month--continue Glipizide      Relevant Medications   benazepril (LOTENSIN) 20 MG tablet   atorvastatin (LIPITOR) 40 MG tablet   glipiZIDE (GLUCOTROL) 10 MG tablet  Total face-to-face time with patient: 25 minutes. Over 50% of encounter was spent on counseling and coordination of care. Return in about 3 months (around 06/23/2019).  Donnamae Jude 03/24/2019 10:34 AM

## 2019-03-24 ENCOUNTER — Encounter: Payer: Self-pay | Admitting: Family Medicine

## 2019-03-24 NOTE — Assessment & Plan Note (Signed)
Stable.  Continue Symbicort. 

## 2019-03-24 NOTE — Assessment & Plan Note (Signed)
On coumadin--level is 2.4 today--continue current dose

## 2019-03-24 NOTE — Assessment & Plan Note (Signed)
Stable, BP is well controlled on Lotensin and Toprol, will continue these.

## 2019-03-24 NOTE — Assessment & Plan Note (Addendum)
Doing well - no chest pain. Continue Atorvastatin, BP meds, NTG prn

## 2019-03-24 NOTE — Assessment & Plan Note (Signed)
Stable, Last A1C was 6.8 last month--continue Glipizide

## 2019-07-17 ENCOUNTER — Telehealth: Payer: Self-pay | Admitting: Family Medicine

## 2019-07-17 ENCOUNTER — Ambulatory Visit (INDEPENDENT_AMBULATORY_CARE_PROVIDER_SITE_OTHER): Payer: Medicare HMO | Admitting: *Deleted

## 2019-07-17 ENCOUNTER — Other Ambulatory Visit: Payer: Self-pay

## 2019-07-17 DIAGNOSIS — Z7901 Long term (current) use of anticoagulants: Secondary | ICD-10-CM

## 2019-07-17 DIAGNOSIS — I359 Nonrheumatic aortic valve disorder, unspecified: Secondary | ICD-10-CM

## 2019-07-17 NOTE — Telephone Encounter (Addendum)
Reviewed [employer] Summons and placed in PCP's box for completion.  Matthew Edwards, [place]

## 2019-07-17 NOTE — Telephone Encounter (Signed)
Pt. Accompanied by daughter presented Madaline Savage Summons form to be completed. Last appt. Date 03/23/19 Form placed in [employer].

## 2019-07-18 LAB — PROTIME-INR
INR: 6.8 (ref 0.9–1.2)
Prothrombin Time: 71.5 s — ABNORMAL HIGH (ref 9.1–12.0)

## 2019-07-19 ENCOUNTER — Encounter: Payer: Self-pay | Admitting: Family Medicine

## 2019-07-19 NOTE — Telephone Encounter (Signed)
Patient called and left voicemail that form was ready for pick up.. Copy made and placed in batch scanning. Original placed in file at front desk for patient to pick up.   Talbot Grumbling, RN

## 2019-07-21 ENCOUNTER — Other Ambulatory Visit: Payer: Self-pay

## 2019-07-21 ENCOUNTER — Ambulatory Visit (INDEPENDENT_AMBULATORY_CARE_PROVIDER_SITE_OTHER): Payer: Medicare HMO | Admitting: *Deleted

## 2019-07-21 DIAGNOSIS — Z7901 Long term (current) use of anticoagulants: Secondary | ICD-10-CM | POA: Diagnosis not present

## 2019-07-21 LAB — POCT INR: INR: 2.8 (ref 2.0–3.0)

## 2019-07-24 ENCOUNTER — Telehealth: Payer: Self-pay | Admitting: *Deleted

## 2019-07-24 NOTE — Telephone Encounter (Signed)
Tried to contact pt to go over screening questions prior to visit and VM was full. Please review these if he calls back. Matthew Edwards, CMA

## 2019-07-25 ENCOUNTER — Ambulatory Visit: Payer: Medicare HMO | Admitting: Family Medicine

## 2019-07-28 ENCOUNTER — Telehealth: Payer: Self-pay | Admitting: Family Medicine

## 2019-07-28 NOTE — Telephone Encounter (Signed)
Spoke with daughter [name] Asked to make an appointment to discuss anticoagulation and risks benefits Given his erratic levels  She agrees

## 2019-08-03 ENCOUNTER — Ambulatory Visit: Payer: Medicare HMO | Attending: Internal Medicine

## 2019-08-03 DIAGNOSIS — Z23 Encounter for immunization: Secondary | ICD-10-CM | POA: Insufficient documentation

## 2019-08-03 NOTE — Progress Notes (Signed)
   Covid-19 Vaccination Clinic  Name:  Matthew Edwards    MRN: [id number] DOB: [birth year]  08/03/2019  [name] was observed post Covid-19 immunization for 30 minutes based on pre-vaccination screening without incident. He was provided with Vaccine Information Sheet and instruction to access the V-Safe system.   [name] was instructed to call 911 with any severe reactions post vaccine: Marland Kitchen Difficulty breathing  . Swelling of face and throat  . A fast heartbeat  . A bad rash all over body  . Dizziness and weakness   Immunizations Administered    Name Date Dose VIS Date Route   Pfizer COVID-19 Vaccine 08/03/2019  3:16 PM 0.3 mL 05/12/2019 Intramuscular   Manufacturer: [place]   Lot: [id number]   [place]: [id number]

## 2019-08-15 ENCOUNTER — Ambulatory Visit: Payer: Medicare HMO | Admitting: Family Medicine

## 2019-08-21 ENCOUNTER — Telehealth: Payer: Self-pay | Admitting: Family Medicine

## 2019-08-21 NOTE — Telephone Encounter (Signed)
[place] After Hours Emergency Call  Patient consented to have virtual visit. Method of visit: Telephone  Encounter participants: Patient: Matthew Edwards - located at Home Provider: Daisy Floro - located at [place] patient back at home/cell number [phone]. Called three times, each time phone rang, then went to voicemail saying "Voicemail is not set up, please try you call again later".   Patient can call back as needed.    Milus Banister, [place], PGY-2 08/21/2019 8:08 PM

## 2019-08-29 ENCOUNTER — Ambulatory Visit (INDEPENDENT_AMBULATORY_CARE_PROVIDER_SITE_OTHER): Payer: Medicare HMO | Admitting: Family Medicine

## 2019-08-29 ENCOUNTER — Encounter: Payer: Self-pay | Admitting: Family Medicine

## 2019-08-29 ENCOUNTER — Other Ambulatory Visit: Payer: Self-pay

## 2019-08-29 VITALS — BP 115/78 | HR 96 | Ht 69.0 in | Wt 226.8 lb

## 2019-08-29 DIAGNOSIS — E1140 Type 2 diabetes mellitus with diabetic neuropathy, unspecified: Secondary | ICD-10-CM

## 2019-08-29 DIAGNOSIS — E118 Type 2 diabetes mellitus with unspecified complications: Secondary | ICD-10-CM

## 2019-08-29 DIAGNOSIS — E0843 Diabetes mellitus due to underlying condition with diabetic autonomic (poly)neuropathy: Secondary | ICD-10-CM

## 2019-08-29 DIAGNOSIS — Z952 Presence of prosthetic heart valve: Secondary | ICD-10-CM | POA: Diagnosis not present

## 2019-08-29 DIAGNOSIS — I359 Nonrheumatic aortic valve disorder, unspecified: Secondary | ICD-10-CM

## 2019-08-29 DIAGNOSIS — F1010 Alcohol abuse, uncomplicated: Secondary | ICD-10-CM

## 2019-08-29 DIAGNOSIS — I4819 Other persistent atrial fibrillation: Secondary | ICD-10-CM

## 2019-08-29 LAB — POCT GLYCOSYLATED HEMOGLOBIN (HGB A1C): HbA1c, POC (controlled diabetic range): 6.2 % (ref 0.0–7.0)

## 2019-08-29 LAB — POCT INR: INR: 1.7 — AB (ref 2.0–3.0)

## 2019-08-29 MED ORDER — GLIPIZIDE 10 MG PO TABS: 5.0000 mg | ORAL_TABLET | Freq: Every day | ORAL | 0 refills | Status: DC

## 2019-08-29 NOTE — Assessment & Plan Note (Signed)
Reports one cup of wine at most per day

## 2019-08-29 NOTE — Progress Notes (Signed)
    SUBJECTIVE:   CHIEF COMPLAINT / HPI:   ANTICOAGULATION Taking warfarin daily.  His daughter puts his medication in pillbox.  No bleeding noticed  ATRIAL FIBRILLATION No chest pain or lightheadness with exertion.  Reports taking metoprolol regularly (did not bring in his medications)  DIABETES Taking 10 mg glipizide at night.  Has some intermittent vision spells sometimes at night   PERTINENT  PMH / PSH: mild cognitive impairment Lives alone  OBJECTIVE:   BP 115/78   Pulse 96   Ht 5\' 9"  (1.753 m)   Wt 226 lb 12.8 oz (102.9 kg)   SpO2 97%   BMI 33.49 kg/m   Heart - irreg irreg No m heard (HS faint) Lung - breath sounds distant no wheeze or rales heard  Extrem - thickened skin on legs trace pitting edema Mobility:able to get up and down from exam table without assistance but is a bit of struggle and uses good knee to pus himself up Hard of hearing but appropriate speech and responses   ASSESSMENT/PLAN:   Alcohol abuse Reports one cup of wine at most per day   Aortic valve disorder Will refer to cardiology to see if can switch his anticoag to a DOAC given he does not seem to have a mechanical valve.  Would be much safer given his erratic INRs  Atrial fibrillation (HCC) Seems stable with rate control   Type 2 diabetes mellitus with diabetic neuropathy, unspecified (HCC) A1c too low given age and cognition.  Will lower glipizide.  May change medications in future    Lind Covert, [place]

## 2019-08-29 NOTE — Patient Instructions (Addendum)
Good to see you today!  Thanks for coming in.  PLEASE BRING IN ALL YOUR MEDICATION BOTTLES  For your heart I put in a referral to Dr Morrison Old group for a visit and discussion about taking a new safer blood thinner Keep taking all the medications as he is  For the Diabetes - Take 1/2 of the glipizide in the AM  Get outside more to help with the feeling down   Come back 3 months for a diabetes check.  If you see the heart doctor we may change your warfarin

## 2019-08-29 NOTE — Assessment & Plan Note (Signed)
Will refer to cardiology to see if can switch his anticoag to a DOAC given he does not seem to have a mechanical valve.  Would be much safer given his erratic INRs

## 2019-08-29 NOTE — Assessment & Plan Note (Signed)
Seems stable with rate control

## 2019-08-29 NOTE — Assessment & Plan Note (Signed)
A1c too low given age and cognition.  Will lower glipizide.  May change medications in future

## 2019-08-30 ENCOUNTER — Ambulatory Visit: Payer: Medicare HMO | Attending: Internal Medicine

## 2019-08-30 DIAGNOSIS — Z23 Encounter for immunization: Secondary | ICD-10-CM

## 2019-08-30 NOTE — Progress Notes (Signed)
   Covid-19 Vaccination Clinic  Name:  KAZDEN LARGO    MRN: [id number] DOB: 03/14/1941  08/30/2019  Mr. Dacosta was observed post Covid-19 immunization for 15 minutes without incident. He was provided with Vaccine Information Sheet and instruction to access the V-Safe system.   Mr. Corsino was instructed to call 911 with any severe reactions post vaccine: Marland Kitchen Difficulty breathing  . Swelling of face and throat  . A fast heartbeat  . A bad rash all over body  . Dizziness and weakness   Immunizations Administered    Name Date Dose VIS Date Route   Pfizer COVID-19 Vaccine 08/30/2019  3:56 PM 0.3 mL 05/12/2019 Intramuscular   Manufacturer: [place]   Lot: [id number]   [place]: [address]-2

## 2019-09-12 ENCOUNTER — Encounter (HOSPITAL_COMMUNITY): Payer: Self-pay | Admitting: Emergency Medicine

## 2019-09-12 ENCOUNTER — Emergency Department (HOSPITAL_COMMUNITY): Payer: Medicare HMO

## 2019-09-12 ENCOUNTER — Emergency Department (HOSPITAL_COMMUNITY)
Admission: EM | Admit: 2019-09-12 | Discharge: 2019-09-12 | Disposition: A | Discharge status: Home / Self Care | Payer: Medicare HMO | Attending: Emergency Medicine | Admitting: Emergency Medicine

## 2019-09-12 ENCOUNTER — Ambulatory Visit: Payer: Medicare HMO

## 2019-09-12 DIAGNOSIS — I110 Hypertensive heart disease with heart failure: Secondary | ICD-10-CM | POA: Diagnosis not present

## 2019-09-12 DIAGNOSIS — Z7901 Long term (current) use of anticoagulants: Secondary | ICD-10-CM | POA: Diagnosis not present

## 2019-09-12 DIAGNOSIS — Z79899 Other long term (current) drug therapy: Secondary | ICD-10-CM | POA: Insufficient documentation

## 2019-09-12 DIAGNOSIS — Z7984 Long term (current) use of oral hypoglycemic drugs: Secondary | ICD-10-CM | POA: Diagnosis not present

## 2019-09-12 DIAGNOSIS — Y999 Unspecified external cause status: Secondary | ICD-10-CM | POA: Insufficient documentation

## 2019-09-12 DIAGNOSIS — I2510 Atherosclerotic heart disease of native coronary artery without angina pectoris: Secondary | ICD-10-CM | POA: Insufficient documentation

## 2019-09-12 DIAGNOSIS — R52 Pain, unspecified: Secondary | ICD-10-CM | POA: Diagnosis not present

## 2019-09-12 DIAGNOSIS — E119 Type 2 diabetes mellitus without complications: Secondary | ICD-10-CM | POA: Insufficient documentation

## 2019-09-12 DIAGNOSIS — S0990XA Unspecified injury of head, initial encounter: Secondary | ICD-10-CM | POA: Diagnosis not present

## 2019-09-12 DIAGNOSIS — Z87891 Personal history of nicotine dependence: Secondary | ICD-10-CM | POA: Diagnosis not present

## 2019-09-12 DIAGNOSIS — J449 Chronic obstructive pulmonary disease, unspecified: Secondary | ICD-10-CM | POA: Diagnosis not present

## 2019-09-12 DIAGNOSIS — I5032 Chronic diastolic (congestive) heart failure: Secondary | ICD-10-CM | POA: Insufficient documentation

## 2019-09-12 DIAGNOSIS — S59902A Unspecified injury of left elbow, initial encounter: Secondary | ICD-10-CM | POA: Diagnosis not present

## 2019-09-12 DIAGNOSIS — W19XXXA Unspecified fall, initial encounter: Secondary | ICD-10-CM | POA: Diagnosis not present

## 2019-09-12 DIAGNOSIS — M79602 Pain in left arm: Secondary | ICD-10-CM | POA: Diagnosis not present

## 2019-09-12 DIAGNOSIS — W01190A Fall on same level from slipping, tripping and stumbling with subsequent striking against furniture, initial encounter: Secondary | ICD-10-CM | POA: Diagnosis not present

## 2019-09-12 DIAGNOSIS — Y939 Activity, unspecified: Secondary | ICD-10-CM | POA: Diagnosis not present

## 2019-09-12 DIAGNOSIS — Y929 Unspecified place or not applicable: Secondary | ICD-10-CM | POA: Diagnosis not present

## 2019-09-12 DIAGNOSIS — Z951 Presence of aortocoronary bypass graft: Secondary | ICD-10-CM | POA: Insufficient documentation

## 2019-09-12 DIAGNOSIS — S4992XA Unspecified injury of left shoulder and upper arm, initial encounter: Secondary | ICD-10-CM | POA: Insufficient documentation

## 2019-09-12 NOTE — Discharge Instructions (Addendum)
Return here as needed.  Follow-up with your doctor for a recheck.  Use ice and heat on the areas that are sore.  Follow-up with the orthopedist provided or if you have 1 if your shoulder is not improving.

## 2019-09-12 NOTE — ED Triage Notes (Signed)
Patient states he didn't fall-states he reached for phone when he was in the bed and hit his left elbow on lamp

## 2019-09-12 NOTE — ED Triage Notes (Signed)
Per EMS-states he fell around 3 am getting up to use the bathroom-complaining of left shoulder pain-no LOC, did not hit head-on blood thinners-no deformity noted

## 2019-09-12 NOTE — ED Provider Notes (Signed)
[facility] DEPT Provider Note   CSN: [id number] Arrival date & time: 09/12/19  1510     History Chief Complaint  Patient presents with  . Fall    Matthew Edwards is a 79 y.o. male.  HPI Patient presents to the emergency department with injuries following a fall.  Patient states it happened last night when he states he tripped and hit against a chair.  Patient states he does not think he lost consciousness.  Patient states that his main area of pain in his left shoulder.  Patient states that he is having no neck pain back pain or hip or lower extremity pain.  Patient denies chest pain, shortness of breath, nausea, vomiting, weakness, dizziness, headache, blurred vision or syncope.  Past Medical History:  Diagnosis Date  . Arthritis   . Atrial fibrillation ([place])   . CAD (coronary artery disease) 2012   [place]-LAD 2 or 3 srents  . Complication of anesthesia    "loopy after polpy removal dec 2017, lasted several days"  . COPD (chronic obstructive pulmonary disease) ([place])   . Dementia ([place])   . GERD (gastroesophageal reflux disease)   . HEART FAILURE, CONGESTIVE UNSPEC 02/23/2007   Qualifier: Diagnosis of  By: Mellody Drown MD, [facility]    . Hyperlipidemia   . Hypertension   . OSA (obstructive sleep apnea) 11/13/2014  . S/P AVR    #25 mm Edwards pericardial valve Bioprosthetic. Dr Cyndia Bent.  . Situational depression    "son passed 11/2013"  . Sleep apnea    occ uses c pap does not know settings   . Stented coronary artery 2013  . TOBACCO DEPENDENCE 07/29/2006   Qualifier: History of  By: Carlena Sax  MD, [place]    . Type II diabetes mellitus [facility])     Patient Active Problem List   Diagnosis Date Noted  . Chronic diastolic CHF (congestive heart failure) ([place]) 12/27/2017  . Counseling and coordination of care 11/20/2017  . Lateral epicondylitis of right elbow 09/27/2016  . Acute pain of both knees 09/08/2016  . Adenomatous rectal polyp with high grade  dysplasia s/p TEM resection 05/26/2016 05/26/2016  . Spinal stenosis of lumbar region 11/28/2015  . Type 2 diabetes mellitus with complication, without long-term current use of insulin ([place])   . Constipation 01/28/2015  . OSA (obstructive sleep apnea) 11/13/2014  . Pulmonary HTN ([place]) 08/24/2014  . Chronic anticoagulation   . Acute on chronic congestive heart failure with left ventricular diastolic dysfunction ([place]) 08/02/2014  . Cognitive impairment 07/02/2014  . Encounter for therapeutic drug monitoring 09/15/2013  . S/P AVR 12/29/2012  . Atrial fibrillation ([place])   . Type 2 diabetes mellitus with diabetic neuropathy, unspecified ([place]) 11/25/2011  . Dizziness 06/14/2010  . Vitamin D deficiency 02/21/2010  . Primary hyperparathyroidism ([place]) 06/04/2009  . Hypercalcemia 05/29/2009  . Essential hypertension 02/23/2007  . DEPRESSION 12/15/2006  . Coronary atherosclerosis 12/15/2006  . GERD 12/15/2006  . COPD (chronic obstructive pulmonary disease) ([place]) 12/10/2006  . HLD (hyperlipidemia) 07/29/2006  . Alcohol abuse 07/29/2006  . Aortic valve disorder 07/29/2006    Past Surgical History:  Procedure Laterality Date  . AORTIC VALVE REPLACEMENT  07/2010   notes 07/28/2010  . CARDIAC CATHETERIZATION  06/2010   Archie Endo 07/01/2010  . COLONOSCOPY WITH PROPOFOL N/A 02/08/2017   Procedure: COLONOSCOPY WITH PROPOFOL;  Surgeon: Doran Stabler, MD;  Location: [facility] ENDOSCOPY;  Service: Gastroenterology;  Laterality: N/A;  . CORONARY ANGIOPLASTY WITH STENT PLACEMENT  2006; 10/2006   "  2"; 1/notes 10/01/2010  . CORONARY ARTERY BYPASS GRAFT  07/2010   "1"/notes 07/28/2010  . PARTIAL PROCTECTOMY BY TEM N/A 05/26/2016   Procedure: TEM PARTIAL PROCTECTOMY OF RECTAL MASS;  Surgeon: [name], MD;  Location: [facility] ORS;  Service: General;  Laterality: N/A;       Family History  Problem Relation Age of Onset  . Heart disease Mother   . Colon cancer Neg Hx   . Esophageal cancer Neg Hx   . Stomach cancer  Neg Hx   . Pancreatic cancer Neg Hx   . Liver disease Neg Hx   . Colon polyps Neg Hx     Social History   Tobacco Use  . Smoking status: Former Smoker    Packs/day: 0.50    Years: 46.00    Pack years: 23.00    Types: Cigarettes  . Smokeless tobacco: Never Used  Substance Use Topics  . Alcohol use: No  . Drug use: No    Home Medications Prior to Admission medications   Medication Sig Start Date End Date Taking? Authorizing Provider  atorvastatin (LIPITOR) 40 MG tablet Take 1 tablet (40 mg total) by mouth daily at 6 PM. TAKE 1 TABLET DAILY AT 6 PM 03/23/19   Donnamae Jude, MD  benazepril (LOTENSIN) 20 MG tablet Take 1 tablet (20 mg total) by mouth daily. 03/23/19   Donnamae Jude, MD  budesonide-formoterol [facility]) 80-4.5 MCG/ACT inhaler Inhale 2 puffs into the lungs 2 (two) times daily. 03/23/19   Donnamae Jude, MD  furosemide (LASIX) 40 MG tablet Take 1 tablet (40 mg total) by mouth 2 (two) times daily. 03/23/19   Donnamae Jude, MD  glipiZIDE (GLUCOTROL) 10 MG tablet Take 0.5 tablets (5 mg total) by mouth daily before breakfast. 08/29/19   Lind Covert, MD  glucose blood test strip USE TO CHECK BLOOD SUGARS FOUR TIMES DAILY 07/05/18   Donnamae Jude, MD  metoprolol succinate (TOPROL-XL) 25 MG 24 hr tablet Take 1 tablet (25 mg total) by mouth daily. 03/23/19   Donnamae Jude, MD  Multiple Vitamins-Minerals (MULTIVITAMIN WITH MINERALS) tablet Take 1 tablet by mouth every evening.    [provider]  nitroGLYCERIN (NITROSTAT) 0.4 MG SL tablet Place 1 tablet (0.4 mg total) under the tongue every 5 (five) minutes as needed. For chest pain Patient not taking: Reported on 08/29/2019 03/23/19   Donnamae Jude, MD  potassium chloride SA (KLOR-CON) 20 MEQ tablet Take 1 tablet (20 mEq total) by mouth at bedtime. 03/23/19   Donnamae Jude, MD  vitamin C (ASCORBIC ACID) 500 MG tablet Take 1 tablet (500 mg total) by mouth every morning. 03/23/19   Donnamae Jude, MD  warfarin  (COUMADIN) 5 MG tablet TAKE AS DIRECTED 10 MG THURSDAYA AND 7.5 MG ALL OTHER DAYS 03/23/19   Donnamae Jude, MD    Allergies    Pineapple concentrate  Review of Systems   Review of Systems All other systems negative except as documented in the HPI. All pertinent positives and negatives as reviewed in the HPI. Physical Exam Updated Vital Signs BP (!) 147/101 (BP Location: Left Arm)   Pulse 71   Temp 99.2 F (37.3 C) (Oral)   Resp 16   SpO2 98%   Physical Exam Vitals and nursing note reviewed.  Constitutional:      General: He is not in acute distress.    Appearance: He is well-developed.  HENT:     Head: Normocephalic and  atraumatic.  Eyes:     Pupils: Pupils are equal, round, and reactive to light.  Cardiovascular:     Rate and Rhythm: Normal rate and regular rhythm.     Heart sounds: Normal heart sounds. No murmur. No friction rub. No gallop.   Pulmonary:     Effort: Pulmonary effort is normal. No respiratory distress.     Breath sounds: Normal breath sounds. No wheezing.  Abdominal:     General: Bowel sounds are normal. There is no distension.     Palpations: Abdomen is soft.     Tenderness: There is no abdominal tenderness.  Musculoskeletal:     Cervical back: Normal range of motion and neck supple.  Skin:    General: Skin is warm and dry.     Capillary Refill: Capillary refill takes less than 2 seconds.     Findings: No erythema or rash.  Neurological:     General: No focal deficit present.     Mental Status: He is alert and oriented to person, place, and time.     Sensory: No sensory deficit.     Motor: No weakness or abnormal muscle tone.     Coordination: Coordination normal.     Gait: Gait normal.  Psychiatric:        Behavior: Behavior normal.     ED Results / Procedures / Treatments   Labs (all labs ordered are listed, but only abnormal results are displayed) Labs Reviewed - No data to display  EKG None  Radiology DG Elbow Complete  Left  Result Date: 09/12/2019 CLINICAL DATA:  Fall, pain EXAM: LEFT ELBOW - COMPLETE 3+ VIEW COMPARISON:  None. FINDINGS: There is no evidence of fracture, dislocation, or joint effusion. There is no evidence of arthropathy or other focal bone abnormality. Soft tissues are unremarkable. IMPRESSION: No fracture or dislocation of the left elbow. No elbow joint effusion to suggest radiographically occult fracture Electronically Signed   By: Eddie Candle M.D.   On: 09/12/2019 16:21   CT Head Wo Contrast  Result Date: 09/12/2019 CLINICAL DATA:  Fall, denies head injury EXAM: CT HEAD WITHOUT CONTRAST TECHNIQUE: Contiguous axial images were obtained from the base of the skull through the vertex without intravenous contrast. COMPARISON:  CT head January 10, 2019 FINDINGS: Brain: No evidence of acute infarction, hemorrhage, hydrocephalus, extra-axial collection or mass lesion/mass effect. Symmetric prominence of the ventricles, cisterns and sulci compatible with parenchymal volume loss. Patchy areas of white matter hypoattenuation are most compatible with chronic microvascular angiopathy. Vascular: Atherosclerotic calcification of the carotid siphons. No hyperdense vessel. Skull: No calvarial fracture or suspicious osseous lesion. No scalp swelling or hematoma. Sinuses/Orbits: Paranasal sinuses and mastoid air cells are predominantly clear. Included orbital structures are unremarkable. Other: None IMPRESSION: 1. No acute intracranial abnormality. No scalp swelling or calvarial fracture. 2. Mild parenchymal volume loss and chronic microvascular angiopathy. Electronically Signed   By: Lovena Le M.D.   On: 09/12/2019 17:11   DG Shoulder Left  Result Date: 09/12/2019 CLINICAL DATA:  Diffuse left arm pain after fall EXAM: LEFT SHOULDER - 2+ VIEW COMPARISON:  None. FINDINGS: No acute fracture or traumatic malalignment. Slightly high-riding appearance of the humeral head with subcortical cystic and sclerotic changes at  the greater tuberosity near the footprint of the supraspinatus may reflect some underlying rotator cuff tendinopathy. Overlying soft tissue thickening may reflect a small amount of fluid in the subacromial subdeltoid bursa or contusive change. Prior CABG and sternotomy changes are incidentally noted. IMPRESSION: 1.  No acute fracture or traumatic malalignment. 2. Findings which may reflect some underlying rotator cuff tendinopathy. 3. Overlying soft tissue thickening may reflect a small amount of fluid in the subacromial subdeltoid bursa or contusive change. Electronically Signed   By: Lovena Le M.D.   On: 09/12/2019 17:25    Procedures Procedures (including critical care time)  Medications Ordered in ED Medications - No data to display  ED Course  I have reviewed the triage vital signs and the nursing notes.  Pertinent labs & imaging results that were available during my care of the patient were reviewed by me and considered in my medical decision making (see chart for details).    MDM Rules/Calculators/A&P                      At this point the patient will be discharged home.  His CT scan and plain films did not show any significant abnormalities.  I did advise him to follow-up with his doctor and will give orthopedic follow-up as needed for her shoulder.  Patient is advised to use ice and heat on the areas that are sore. Final Clinical Impression(s) / ED Diagnoses Final diagnoses:  Fall    Rx / DC Orders ED Discharge Orders    None       Dalia Heading, PA-C 09/12/19 [place], Ankit, MD 09/14/19 [phone]

## 2019-09-20 NOTE — Progress Notes (Deleted)
Cardiology Office Note    Date:  09/20/2019   ID:  Matthew Edwards, DOB Nov 21, 1940, MRN [id number]  PCP:  Matthew Covert, MD  Cardiologist:  Matthew. [place] [facility]: Matthew. Claiborne Edwards   No chief complaint on file.   History of Present Illness:  Matthew Edwards is a 79 y.o. male seen for follow up CAD. Last seen in October 2018. He has a  PMH of CAD s/p stent to LAD and LCx, chronic atrial fibrillation on Coumadin, COPD, GERD, hypertension, hyperlipidemia, DM II, severe OSA and s/p AVR. In 2012, he underwent aortic valve replacement with #41mm Edwards pericardial valve and had LIMA to LAD CABG. Last cardiac catheterization performed in January 2012 prior to CABG and valvular replacement surgery shows patent LAD stent with 40% stenosis proximal to the stent, patent left circumflex stent. He was initially placed on CPAP, this was later transitioned to BiPAP. This  has been managed by Matthew. Claiborne Edwards.   The patient did undergo surgical incision of a large polyp in Dec 2017. Since then he has refractory fecal incontinence. Underwent colonoscopy with removal of additional 2 polyps last month.   On follow up today he is seen with his daughter. States he is doing fair. Has some LE edema. Chronic DOE. Being evaluated by PT currently. Still not able to use CPAP. Daughter reports he has tried different masks but continues to have throat irritation with use. Notes his gait is more unsteady with wide gait.   He denies any chest pain.   Past Medical History:  Diagnosis Date  . Arthritis   . Atrial fibrillation ([place])   . CAD (coronary artery disease) 2012   [place]-LAD 2 or 3 srents  . Complication of anesthesia    "loopy after polpy removal dec 2017, lasted several days"  . COPD (chronic obstructive pulmonary disease) ([place])   . Dementia ([place])   . GERD (gastroesophageal reflux disease)   . HEART FAILURE, CONGESTIVE UNSPEC 02/23/2007   Qualifier: Diagnosis of  By: Matthew Drown MD, [facility]    . Hyperlipidemia     . Hypertension   . OSA (obstructive sleep apnea) 11/13/2014  . S/P AVR    #25 mm Edwards pericardial valve Bioprosthetic. Matthew Matthew Edwards.  . Situational depression    "son passed 11/2013"  . Sleep apnea    occ uses c pap does not know settings   . Stented coronary artery 2013  . TOBACCO DEPENDENCE 07/29/2006   Qualifier: History of  By: Matthew Edwards    . Type II diabetes mellitus ([place])     Past Surgical History:  Procedure Laterality Date  . AORTIC VALVE REPLACEMENT  07/2010   notes 07/28/2010  . CARDIAC CATHETERIZATION  06/2010   Matthew Edwards 07/01/2010  . COLONOSCOPY WITH PROPOFOL N/A 02/08/2017   Procedure: COLONOSCOPY WITH PROPOFOL;  Surgeon: Matthew Stabler, MD;  [facility];  Service: Gastroenterology;  Laterality: N/A;  . CORONARY ANGIOPLASTY WITH STENT PLACEMENT  2006; 10/2006   "2"; 1/notes 10/01/2010  . CORONARY ARTERY BYPASS GRAFT  07/2010   "1"/notes 07/28/2010  . PARTIAL PROCTECTOMY BY TEM N/A 05/26/2016   Procedure: TEM PARTIAL PROCTECTOMY OF RECTAL MASS;  Surgeon: Matthew Boston, MD;  Location: [facility]S;  Service: General;  Laterality: N/A;    Current Medications: Outpatient Medications Prior to Visit  Medication Sig Dispense Refill  . atorvastatin (LIPITOR) 40 MG tablet Take 1 tablet (40 mg total) by mouth daily at 6 PM. TAKE 1 TABLET DAILY AT  6 PM 90 tablet 3  . benazepril (LOTENSIN) 20 MG tablet Take 1 tablet (20 mg total) by mouth daily. 90 tablet 3  . budesonide-formoterol (SYMBICORT) 80-4.5 MCG/ACT inhaler Inhale 2 puffs into the lungs 2 (two) times daily. 3 g 11  . furosemide (LASIX) 40 MG tablet Take 1 tablet (40 mg total) by mouth 2 (two) times daily. 180 tablet 3  . glipiZIDE (GLUCOTROL) 10 MG tablet Take 0.5 tablets (5 mg total) by mouth daily before breakfast. 45 tablet 0  . glucose blood test strip USE TO CHECK BLOOD SUGARS FOUR TIMES DAILY 400 each 3  . metoprolol succinate (TOPROL-XL) 25 MG 24 hr tablet Take 1 tablet (25 mg total) by mouth daily. 90  tablet 3  . Multiple Vitamins-Minerals (MULTIVITAMIN WITH MINERALS) tablet Take 1 tablet by mouth every evening.    . nitroGLYCERIN (NITROSTAT) 0.4 MG SL tablet Place 1 tablet (0.4 mg total) under the tongue every 5 (five) minutes as needed. For chest pain (Patient not taking: Reported on 08/29/2019) 25 tablet 6  . potassium chloride SA (KLOR-CON) 20 MEQ tablet Take 1 tablet (20 mEq total) by mouth at bedtime. 90 tablet 3  . vitamin C (ASCORBIC ACID) 500 MG tablet Take 1 tablet (500 mg total) by mouth every morning. 90 tablet 3  . warfarin (COUMADIN) 5 MG tablet TAKE AS DIRECTED 10 MG THURSDAYA AND 7.5 MG ALL OTHER DAYS 260 tablet 3   No facility-administered medications prior to visit.     Allergies:   Pineapple concentrate   Social History   Socioeconomic History  . Marital status: Legally Separated    Spouse name: Not on file  . Number of children: 7  . Years of education: 45  . Highest education level: Not on file  Occupational History  . Occupation: Retired-Post Office manager: RETIRED  . Occupation: [employer]  Tobacco Use  . Smoking status: Former Smoker    Packs/day: 0.50    Years: 46.00    Pack years: 23.00    Types: Cigarettes  . Smokeless tobacco: Never Used  Substance and Sexual Activity  . Alcohol use: No  . Drug use: No  . Sexual activity: Not on file  Other Topics Concern  . Not on file  Social History Narrative   Health Care POA:    Emergency Contact: Daughter, Matthew Edwards   End of Life Plan:    Who lives with you: self   Any pets: none   Diet: Patient has a varied diet of protein, starch, and vegetables.   Exercise: Pt has not regular exercise routine.   Seatbelts: Pt reports wearing seatbelt when in vehile.   Nancy Fetter Exposure/Protection:    Hobbies: fishing, tv      Daughter Matthew Edwards [phone] - main caretaker currently    Matthew Edwards    Social Determinants of Health   Financial Resource Strain:   . Difficulty of Paying Living Expenses:     Food Insecurity:   . Worried About Charity fundraiser in the Last Year:   . Arboriculturist in the Last Year:   Transportation Needs:   . Film/video editor (Medical):   Marland Kitchen Lack of Transportation (Non-Medical):   Physical Activity:   . Days of Exercise per Week:   . Minutes of Exercise per Session:   Stress:   . Feeling of Stress :   Social Connections:   . Frequency of Communication with Friends and Family:   .  Frequency of Social Gatherings with Friends and Family:   . Attends Religious Services:   . Active Member of Clubs or Organizations:   . Attends Archivist Meetings:   Marland Kitchen Marital Status:      Family History:  The patient's family history includes Heart disease in his mother.   ROS:   Please see the history of present illness.    ROS All other systems reviewed and are negative.   PHYSICAL EXAM:   VS:  There were no vitals taken for this visit.   GENERAL:  Well appearing, obese BM in NAD HEENT:  PERRL, EOMI, sclera are clear. Oropharynx is clear. NECK:  No jugular venous distention, carotid upstroke brisk and symmetric, no bruits, no thyromegaly or adenopathy LUNGS:  Clear to auscultation bilaterally CHEST:  Unremarkable HEART: IRRR,  PMI not displaced or sustained,S1 and S2 within normal limits, no S3, no S4: no clicks, no rubs, no murmurs ABD:  Soft, nontender. BS +, no masses or bruits. No hepatomegaly, no splenomegaly EXT:  2 + pulses throughout, trace edema, no cyanosis no clubbing SKIN:  Warm and dry.  No rashes NEURO:  Alert and oriented x 3. Cranial nerves II through XII intact. PSYCH:  Cognitively intact    Wt Readings from Last 3 Encounters:  08/29/19 226 lb 12.8 oz (102.9 kg)  03/23/19 230 lb (104.3 kg)  02/10/19 232 lb (105.2 kg)      Studies/Labs Reviewed:   EKG:  EKG is  ordered today.  Afib with rate 75. Otherwise normal. I have personally reviewed and interpreted this study.   Recent Labs: 01/10/2019: ALT 18; BUN 15;  Creatinine, Ser 1.20; Hemoglobin 13.9; Platelets 131; Potassium 4.2; Sodium 137   Lipid Panel    Component Value Date/Time   CHOL 145 07/25/2015 1558   TRIG 181 (H) 07/25/2015 1558   HDL 39 (L) 07/25/2015 1558   CHOLHDL 3.7 07/25/2015 1558   VLDL 36 (H) 07/25/2015 1558   LDLCALC 70 07/25/2015 1558   LDLDIRECT 81 12/23/2012 1638      ASSESSMENT:    No diagnosis found.   PLAN:  In order of problems listed above:  1. Afib chronic. Rate well controlled on metoprolol and Cardizem. On coumadin. INR followed by Matthew. [place] Rounds.  2. CAD s/p CABG: Last cardiac catheterization in 2012, stable anatomy at the time with patent stent in left circumflex and LAD. Underwent LIMA to LAD bypass surgery during valvular surgery. He is asymptomatic.  3. Hypertension: Blood pressure is well controlled today  4. Hyperlipidemia: On Lipitor daily. Labs followed by primary care.  5. DM II: On glipizide   6. S/p aortic valve replacement: Last echo March 2016, EF 50-55%, stable anatomy with bioprosthetic aortic valve.   7. Edema- well controlled on current lasix dose. Continue Rx and sodium restriction. Discussed compression hose but this would be a challenge for him to put on.  8. OSA. Unable to tolerate CPAP    Medication Adjustments/Labs and Tests Ordered: Current medicines are reviewed at length with the patient today.  Concerns regarding medicines are outlined above.  Medication changes, Labs and Tests ordered today are listed in the Patient Instructions below. There are no Patient Instructions on file for this visit.   Signed, Kathleena Freeman [place], MD  09/20/2019 7:30 AM    [facility] [place], [place], [place]  [address] Phone: [phone]; Fax: [phone]

## 2019-09-28 ENCOUNTER — Ambulatory Visit: Payer: Medicare HMO | Admitting: Cardiology

## 2019-10-31 ENCOUNTER — Other Ambulatory Visit: Payer: Self-pay | Admitting: Family Medicine

## 2019-10-31 DIAGNOSIS — E0843 Diabetes mellitus due to underlying condition with diabetic autonomic (poly)neuropathy: Secondary | ICD-10-CM

## 2019-11-21 ENCOUNTER — Encounter: Payer: Self-pay | Admitting: Family Medicine

## 2019-11-21 ENCOUNTER — Ambulatory Visit (INDEPENDENT_AMBULATORY_CARE_PROVIDER_SITE_OTHER): Payer: Medicare HMO | Admitting: Family Medicine

## 2019-11-21 ENCOUNTER — Other Ambulatory Visit: Payer: Self-pay

## 2019-11-21 VITALS — BP 92/62 | HR 89 | Ht 69.0 in | Wt 211.4 lb

## 2019-11-21 DIAGNOSIS — R634 Abnormal weight loss: Secondary | ICD-10-CM

## 2019-11-21 DIAGNOSIS — I359 Nonrheumatic aortic valve disorder, unspecified: Secondary | ICD-10-CM | POA: Diagnosis not present

## 2019-11-21 DIAGNOSIS — H6123 Impacted cerumen, bilateral: Secondary | ICD-10-CM

## 2019-11-21 DIAGNOSIS — E1140 Type 2 diabetes mellitus with diabetic neuropathy, unspecified: Secondary | ICD-10-CM | POA: Diagnosis not present

## 2019-11-21 DIAGNOSIS — E559 Vitamin D deficiency, unspecified: Secondary | ICD-10-CM

## 2019-11-21 DIAGNOSIS — E118 Type 2 diabetes mellitus with unspecified complications: Secondary | ICD-10-CM | POA: Diagnosis not present

## 2019-11-21 LAB — POCT GLYCOSYLATED HEMOGLOBIN (HGB A1C): HbA1c, POC (controlled diabetic range): 6.5 % (ref 0.0–7.0)

## 2019-11-21 LAB — POCT INR: INR: 3.2 — AB (ref 2.0–3.0)

## 2019-11-21 NOTE — Patient Instructions (Addendum)
Good to see you today!  Thanks for coming in.  Please bring all his medication bottles next visit  I will call you if your lab tests are not normal.  Otherwise we will discuss them at your next visit.  I will look into his anticoagulation and his prior GI visits  Use Murine Ear Wax softner and wash once a week for his ears   Depending on his blood tests we may need to lower his diabetes medicine   Make an appointment to come back in two weeks

## 2019-11-22 LAB — CMP14+EGFR
ALT: 14 IU/L (ref 0–44)
AST: 23 IU/L (ref 0–40)
Albumin/Globulin Ratio: 1.5 (ref 1.2–2.2)
Albumin: 4 g/dL (ref 3.7–4.7)
Alkaline Phosphatase: 85 IU/L (ref 48–121)
BUN/Creatinine Ratio: 6 — ABNORMAL LOW (ref 10–24)
BUN: 7 mg/dL — ABNORMAL LOW (ref 8–27)
Bilirubin Total: 1 mg/dL (ref 0.0–1.2)
CO2: 26 mmol/L (ref 20–29)
Calcium: 10.7 mg/dL — ABNORMAL HIGH (ref 8.6–10.2)
Chloride: 100 mmol/L (ref 96–106)
Creatinine, Ser: 1.18 mg/dL (ref 0.76–1.27)
GFR calc Af Amer: 69 mL/min/{1.73_m2} (ref >59)
GFR calc non Af Amer: 60 mL/min/{1.73_m2} (ref >59)
Globulin, Total: 2.6 g/dL (ref 1.5–4.5)
Glucose: 98 mg/dL (ref 65–99)
Potassium: 4.3 mmol/L (ref 3.5–5.2)
Sodium: 140 mmol/L (ref 134–144)
Total Protein: 6.6 g/dL (ref 6.0–8.5)

## 2019-11-22 LAB — CBC
Hematocrit: 42.7 % (ref 37.5–51.0)
Hemoglobin: 14.1 g/dL (ref 13.0–17.7)
MCH: 31 pg (ref 26.6–33.0)
MCHC: 33 g/dL (ref 31.5–35.7)
MCV: 94 fL (ref 79–97)
Platelets: 172 10*3/uL (ref 150–450)
RBC: 4.55 x10E6/uL (ref 4.14–5.80)
RDW: 13.5 % (ref 11.6–15.4)
WBC: 5.3 10*3/uL (ref 3.4–10.8)

## 2019-11-22 LAB — VITAMIN D 25 HYDROXY (VIT D DEFICIENCY, FRACTURES): Vit D, 25-Hydroxy: 28.1 ng/mL — ABNORMAL LOW (ref 30.0–100.0)

## 2019-11-22 LAB — TSH: TSH: 1.75 u[IU]/mL (ref 0.450–4.500)

## 2019-11-23 DIAGNOSIS — H6120 Impacted cerumen, unspecified ear: Secondary | ICD-10-CM | POA: Insufficient documentation

## 2019-11-23 NOTE — Progress Notes (Signed)
    SUBJECTIVE:   CHIEF COMPLAINT / HPI:   Accompanied by daughter and other on the phone  DIABETES Disease Monitoring: Blood Sugar range-usually in 100s    Medications Adherence did not bring meds Hypoglycemic symptoms- no  HEARING Decreased hearing in both ears.  Wonder if he needs a hearing aid  WEIGHT LOSS He is having frequent diarrhea and does not seem to eat as much.  No fever or bleeding    PERTINENT  PMH / PSH: has 24/7 supervision  OBJECTIVE:   BP 92/62   Pulse 89   Ht 5\' 9"  (1.753 m)   Wt 211 lb 6.4 oz (95.9 kg)   SpO2 97%   BMI 31.22 kg/m   Ears - bilateral cerumen impaction.  Unable to remove with irrigation H - RRR L - clear Trace edema at ankles   ASSESSMENT/PLAN:   Cerumen impaction Ears irrigated but not all wax removed.  Use softner and reexamine next visit   Aortic valve disorder Discussed with cardiology and DOAC is fine with his valve.  According to family had tried eliquis in past but had muscle aches and gi side effects.  No history of trying xarelto in records.  Will recommend this next visit   Weight loss Lost 20 lbs since October.  Will check labs and go through his medications next visit   Type 2 diabetes mellitus with diabetic neuropathy, unspecified ([place]) A1c continues low and with weight loss will likely continue to wean off glipizide.  Asked to bring all medications next visit      Lind Covert, Kathleen

## 2019-11-23 NOTE — Assessment & Plan Note (Signed)
Discussed with cardiology and DOAC is fine with his valve.  According to family had tried eliquis in past but had muscle aches and gi side effects.  No history of trying xarelto in records.  Will recommend this next visit

## 2019-11-23 NOTE — Assessment & Plan Note (Signed)
Ears irrigated but not all wax removed.  Use softner and reexamine next visit

## 2019-11-23 NOTE — Assessment & Plan Note (Signed)
A1c continues low and with weight loss will likely continue to wean off glipizide.  Asked to bring all medications next visit

## 2019-11-23 NOTE — Assessment & Plan Note (Signed)
Lost 20 lbs since October.  Will check labs and go through his medications next visit

## 2019-12-05 ENCOUNTER — Ambulatory Visit: Payer: Medicare HMO

## 2019-12-12 ENCOUNTER — Other Ambulatory Visit: Payer: Self-pay

## 2019-12-12 ENCOUNTER — Ambulatory Visit: Payer: Medicare HMO | Admitting: Family Medicine

## 2019-12-12 ENCOUNTER — Ambulatory Visit (INDEPENDENT_AMBULATORY_CARE_PROVIDER_SITE_OTHER): Payer: Medicare HMO | Admitting: Family Medicine

## 2019-12-12 DIAGNOSIS — E1140 Type 2 diabetes mellitus with diabetic neuropathy, unspecified: Secondary | ICD-10-CM | POA: Diagnosis not present

## 2019-12-12 DIAGNOSIS — I359 Nonrheumatic aortic valve disorder, unspecified: Secondary | ICD-10-CM | POA: Diagnosis not present

## 2019-12-12 DIAGNOSIS — R634 Abnormal weight loss: Secondary | ICD-10-CM | POA: Diagnosis not present

## 2019-12-12 DIAGNOSIS — D128 Benign neoplasm of rectum: Secondary | ICD-10-CM | POA: Diagnosis not present

## 2019-12-12 DIAGNOSIS — H6123 Impacted cerumen, bilateral: Secondary | ICD-10-CM | POA: Diagnosis not present

## 2019-12-12 LAB — POCT INR: INR: 1.7 — AB (ref 2.0–3.0)

## 2019-12-12 NOTE — Assessment & Plan Note (Addendum)
Continues. Seems to be related to GI issues.  Lab work up unremarkable.   Has been seen by [facility] in past - will refer

## 2019-12-12 NOTE — Progress Notes (Signed)
    SUBJECTIVE:   Accompanied by daughter and other on the phone  DIABETES Disease Monitoring: Blood Sugar range-usually in 100s    Medications Adherence taking glipizide daily Hypoglycemic symptoms- no  HEARING Decreased hearing in both ears.  Has been using debrox but not much improvement  DIARRHEA He is having frequent diarrhea and does not seem to eat as much.  No fever or bleeding.  History of rectal polyps.  Has not seen GI for some time   PERTINENT  PMH / PSH: has 24/7 supervision.  They arrived late but did bring in his medications   OBJECTIVE:   BP 92/62   Pulse 92   Wt 211 lb 12.8 oz (96.1 kg)   SpO2 96%   BMI 31.28 kg/m   Alert cooperative Both TM obscured by wax.  After irrigation both tm visible with mild irritation with small amount of bleeding of R canal   ASSESSMENT/PLAN:   Cerumen impaction Improved after irrigation. Continue debrox weekly   Type 2 diabetes mellitus with diabetic neuropathy, unspecified ([place]) Too well controlled.  Will stop glipzide completely   Weight loss Continues. Seems to be related to GI issues.  Lab work up unremarkable.   Has been seen by [facility] in past - will refer    Have return for discussion of anticoagulation and hypertension   Lind Covert, MD [place]

## 2019-12-12 NOTE — Patient Instructions (Addendum)
Good to see you today!  Thanks for coming in.  Continue the ear drops twice a day if possible  Ask the dentist to contact me as to what he needs and I will adjust his medications  I will refer him [facility] for his bowels   Stop the Glipizide for his diabetes to keep his blood sugar from getting to low  No more than one glass of wine per day  Come back in 2-4 weeks to talk about the warfarin and check his ears

## 2019-12-12 NOTE — Assessment & Plan Note (Signed)
Too well controlled.  Will stop glipzide completely

## 2019-12-12 NOTE — Assessment & Plan Note (Signed)
Improved after irrigation. Continue debrox weekly

## 2020-01-02 ENCOUNTER — Emergency Department (HOSPITAL_COMMUNITY): Payer: Medicare HMO

## 2020-01-02 ENCOUNTER — Other Ambulatory Visit: Payer: Self-pay

## 2020-01-02 ENCOUNTER — Encounter (HOSPITAL_COMMUNITY): Payer: Self-pay | Admitting: Emergency Medicine

## 2020-01-02 ENCOUNTER — Inpatient Hospital Stay (HOSPITAL_COMMUNITY)
Admission: EM | Admit: 2020-01-02 | Discharge: 2020-01-11 | DRG: 309 | Disposition: A | Discharge status: Skilled Nursing Facility | Payer: Medicare HMO | Attending: Family Medicine | Admitting: Family Medicine

## 2020-01-02 DIAGNOSIS — I5033 Acute on chronic diastolic (congestive) heart failure: Secondary | ICD-10-CM

## 2020-01-02 DIAGNOSIS — Z79899 Other long term (current) drug therapy: Secondary | ICD-10-CM

## 2020-01-02 DIAGNOSIS — E559 Vitamin D deficiency, unspecified: Secondary | ICD-10-CM | POA: Diagnosis present

## 2020-01-02 DIAGNOSIS — Z743 Need for continuous supervision: Secondary | ICD-10-CM | POA: Diagnosis not present

## 2020-01-02 DIAGNOSIS — I110 Hypertensive heart disease with heart failure: Secondary | ICD-10-CM | POA: Diagnosis present

## 2020-01-02 DIAGNOSIS — K219 Gastro-esophageal reflux disease without esophagitis: Secondary | ICD-10-CM | POA: Diagnosis present

## 2020-01-02 DIAGNOSIS — I272 Pulmonary hypertension, unspecified: Secondary | ICD-10-CM | POA: Diagnosis present

## 2020-01-02 DIAGNOSIS — Z953 Presence of xenogenic heart valve: Secondary | ICD-10-CM | POA: Diagnosis not present

## 2020-01-02 DIAGNOSIS — I4821 Permanent atrial fibrillation: Secondary | ICD-10-CM

## 2020-01-02 DIAGNOSIS — Z9181 History of falling: Secondary | ICD-10-CM

## 2020-01-02 DIAGNOSIS — J449 Chronic obstructive pulmonary disease, unspecified: Secondary | ICD-10-CM | POA: Diagnosis not present

## 2020-01-02 DIAGNOSIS — G4733 Obstructive sleep apnea (adult) (pediatric): Secondary | ICD-10-CM | POA: Diagnosis present

## 2020-01-02 DIAGNOSIS — Z87891 Personal history of nicotine dependence: Secondary | ICD-10-CM

## 2020-01-02 DIAGNOSIS — E785 Hyperlipidemia, unspecified: Secondary | ICD-10-CM | POA: Diagnosis present

## 2020-01-02 DIAGNOSIS — F0390 Unspecified dementia without behavioral disturbance: Secondary | ICD-10-CM | POA: Diagnosis not present

## 2020-01-02 DIAGNOSIS — I4819 Other persistent atrial fibrillation: Secondary | ICD-10-CM | POA: Diagnosis not present

## 2020-01-02 DIAGNOSIS — Z951 Presence of aortocoronary bypass graft: Secondary | ICD-10-CM

## 2020-01-02 DIAGNOSIS — I10 Essential (primary) hypertension: Secondary | ICD-10-CM

## 2020-01-02 DIAGNOSIS — S299XXA Unspecified injury of thorax, initial encounter: Secondary | ICD-10-CM | POA: Diagnosis not present

## 2020-01-02 DIAGNOSIS — R279 Unspecified lack of coordination: Secondary | ICD-10-CM | POA: Diagnosis not present

## 2020-01-02 DIAGNOSIS — R4189 Other symptoms and signs involving cognitive functions and awareness: Secondary | ICD-10-CM | POA: Diagnosis present

## 2020-01-02 DIAGNOSIS — M25512 Pain in left shoulder: Secondary | ICD-10-CM | POA: Diagnosis present

## 2020-01-02 DIAGNOSIS — Z952 Presence of prosthetic heart valve: Secondary | ICD-10-CM | POA: Diagnosis not present

## 2020-01-02 DIAGNOSIS — R748 Abnormal levels of other serum enzymes: Secondary | ICD-10-CM | POA: Diagnosis present

## 2020-01-02 DIAGNOSIS — W19XXXA Unspecified fall, initial encounter: Secondary | ICD-10-CM | POA: Diagnosis not present

## 2020-01-02 DIAGNOSIS — R410 Disorientation, unspecified: Secondary | ICD-10-CM | POA: Diagnosis not present

## 2020-01-02 DIAGNOSIS — E1140 Type 2 diabetes mellitus with diabetic neuropathy, unspecified: Secondary | ICD-10-CM | POA: Diagnosis not present

## 2020-01-02 DIAGNOSIS — I5032 Chronic diastolic (congestive) heart failure: Secondary | ICD-10-CM | POA: Diagnosis not present

## 2020-01-02 DIAGNOSIS — E210 Primary hyperparathyroidism: Secondary | ICD-10-CM | POA: Diagnosis present

## 2020-01-02 DIAGNOSIS — R296 Repeated falls: Secondary | ICD-10-CM | POA: Diagnosis not present

## 2020-01-02 DIAGNOSIS — Z955 Presence of coronary angioplasty implant and graft: Secondary | ICD-10-CM | POA: Diagnosis not present

## 2020-01-02 DIAGNOSIS — R4182 Altered mental status, unspecified: Secondary | ICD-10-CM | POA: Diagnosis present

## 2020-01-02 DIAGNOSIS — M6282 Rhabdomyolysis: Secondary | ICD-10-CM

## 2020-01-02 DIAGNOSIS — Z20822 Contact with and (suspected) exposure to covid-19: Secondary | ICD-10-CM | POA: Diagnosis not present

## 2020-01-02 DIAGNOSIS — I081 Rheumatic disorders of both mitral and tricuspid valves: Secondary | ICD-10-CM | POA: Diagnosis present

## 2020-01-02 DIAGNOSIS — M6281 Muscle weakness (generalized): Secondary | ICD-10-CM | POA: Diagnosis not present

## 2020-01-02 DIAGNOSIS — M1990 Unspecified osteoarthritis, unspecified site: Secondary | ICD-10-CM | POA: Diagnosis present

## 2020-01-02 DIAGNOSIS — I4891 Unspecified atrial fibrillation: Secondary | ICD-10-CM

## 2020-01-02 DIAGNOSIS — R41841 Cognitive communication deficit: Secondary | ICD-10-CM | POA: Diagnosis not present

## 2020-01-02 DIAGNOSIS — R2681 Unsteadiness on feet: Secondary | ICD-10-CM | POA: Diagnosis present

## 2020-01-02 DIAGNOSIS — S2231XA Fracture of one rib, right side, initial encounter for closed fracture: Secondary | ICD-10-CM | POA: Diagnosis present

## 2020-01-02 DIAGNOSIS — I482 Chronic atrial fibrillation, unspecified: Secondary | ICD-10-CM

## 2020-01-02 DIAGNOSIS — I472 Ventricular tachycardia: Secondary | ICD-10-CM | POA: Diagnosis not present

## 2020-01-02 DIAGNOSIS — I2510 Atherosclerotic heart disease of native coronary artery without angina pectoris: Secondary | ICD-10-CM | POA: Diagnosis present

## 2020-01-02 DIAGNOSIS — I361 Nonrheumatic tricuspid (valve) insufficiency: Secondary | ICD-10-CM | POA: Diagnosis not present

## 2020-01-02 DIAGNOSIS — S0990XA Unspecified injury of head, initial encounter: Secondary | ICD-10-CM | POA: Diagnosis not present

## 2020-01-02 DIAGNOSIS — Z7901 Long term (current) use of anticoagulants: Secondary | ICD-10-CM

## 2020-01-02 DIAGNOSIS — Z91018 Allergy to other foods: Secondary | ICD-10-CM

## 2020-01-02 DIAGNOSIS — I499 Cardiac arrhythmia, unspecified: Secondary | ICD-10-CM | POA: Diagnosis not present

## 2020-01-02 DIAGNOSIS — Z7951 Long term (current) use of inhaled steroids: Secondary | ICD-10-CM

## 2020-01-02 DIAGNOSIS — I480 Paroxysmal atrial fibrillation: Secondary | ICD-10-CM | POA: Diagnosis present

## 2020-01-02 DIAGNOSIS — R278 Other lack of coordination: Secondary | ICD-10-CM | POA: Diagnosis not present

## 2020-01-02 DIAGNOSIS — E781 Pure hyperglyceridemia: Secondary | ICD-10-CM | POA: Diagnosis present

## 2020-01-02 DIAGNOSIS — R2689 Other abnormalities of gait and mobility: Secondary | ICD-10-CM | POA: Diagnosis not present

## 2020-01-02 DIAGNOSIS — S4992XA Unspecified injury of left shoulder and upper arm, initial encounter: Secondary | ICD-10-CM | POA: Diagnosis not present

## 2020-01-02 DIAGNOSIS — Z8249 Family history of ischemic heart disease and other diseases of the circulatory system: Secondary | ICD-10-CM

## 2020-01-02 LAB — BASIC METABOLIC PANEL
Anion gap: 11 (ref 5–15)
BUN: 17 mg/dL (ref 8–23)
CO2: 26 mmol/L (ref 22–32)
Calcium: 10.6 mg/dL — ABNORMAL HIGH (ref 8.9–10.3)
Chloride: 100 mmol/L (ref 98–111)
Creatinine, Ser: 1.29 mg/dL — ABNORMAL HIGH (ref 0.61–1.24)
GFR calc Af Amer: >60 mL/min (ref >60)
GFR calc non Af Amer: 53 mL/min — ABNORMAL LOW (ref >60)
Glucose, Bld: 160 mg/dL — ABNORMAL HIGH (ref 70–99)
Potassium: 4.1 mmol/L (ref 3.5–5.1)
Sodium: 137 mmol/L (ref 135–145)

## 2020-01-02 LAB — CBC
HCT: 46.8 % (ref 39.0–52.0)
Hemoglobin: 14.8 g/dL (ref 13.0–17.0)
MCH: 29.8 pg (ref 26.0–34.0)
MCHC: 31.6 g/dL (ref 30.0–36.0)
MCV: 94.4 fL (ref 80.0–100.0)
Platelets: 149 10*3/uL — ABNORMAL LOW (ref 150–400)
RBC: 4.96 MIL/uL (ref 4.22–5.81)
RDW: 13.3 % (ref 11.5–15.5)
WBC: 8.8 10*3/uL (ref 4.0–10.5)
nRBC: 0 % (ref 0.0–0.2)

## 2020-01-02 MED ORDER — SODIUM CHLORIDE 0.9% FLUSH: 3.0000 mL | Freq: Once | INTRAVENOUS | Status: DC

## 2020-01-02 NOTE — ED Triage Notes (Signed)
Pt arrives to ER with daughter with c/o of fall yesterday while she was in the hospital he was home alone and fell. Pt does not recall events leading to fall but has abrasion to right shoulder. Pt has no obvious head injury and denies pain.

## 2020-01-03 ENCOUNTER — Emergency Department (HOSPITAL_COMMUNITY): Payer: Medicare HMO

## 2020-01-03 ENCOUNTER — Other Ambulatory Visit: Payer: Self-pay

## 2020-01-03 DIAGNOSIS — M6282 Rhabdomyolysis: Secondary | ICD-10-CM

## 2020-01-03 DIAGNOSIS — R410 Disorientation, unspecified: Secondary | ICD-10-CM

## 2020-01-03 DIAGNOSIS — R4182 Altered mental status, unspecified: Secondary | ICD-10-CM | POA: Diagnosis present

## 2020-01-03 LAB — URINALYSIS, ROUTINE W REFLEX MICROSCOPIC
Bilirubin Urine: NEGATIVE
Glucose, UA: NEGATIVE mg/dL
Hgb urine dipstick: NEGATIVE
Ketones, ur: 5 mg/dL — AB
Leukocytes,Ua: NEGATIVE
Nitrite: NEGATIVE
Protein, ur: NEGATIVE mg/dL
Specific Gravity, Urine: 1.025 (ref 1.005–1.030)
pH: 5 (ref 5.0–8.0)

## 2020-01-03 LAB — PROTIME-INR
INR: 2.5 — ABNORMAL HIGH (ref 0.8–1.2)
Prothrombin Time: 26.1 seconds — ABNORMAL HIGH (ref 11.4–15.2)

## 2020-01-03 LAB — HEPATIC FUNCTION PANEL
ALT: 23 U/L (ref 0–44)
AST: 55 U/L — ABNORMAL HIGH (ref 15–41)
Albumin: 4.2 g/dL (ref 3.5–5.0)
Alkaline Phosphatase: 81 U/L (ref 38–126)
Bilirubin, Direct: 0.5 mg/dL — ABNORMAL HIGH (ref 0.0–0.2)
Indirect Bilirubin: 1.8 mg/dL — ABNORMAL HIGH (ref 0.3–0.9)
Total Bilirubin: 2.3 mg/dL — ABNORMAL HIGH (ref 0.3–1.2)
Total Protein: 7.7 g/dL (ref 6.5–8.1)

## 2020-01-03 LAB — SARS CORONAVIRUS 2 BY RT PCR (HOSPITAL ORDER, PERFORMED IN ~~LOC~~ HOSPITAL LAB): SARS Coronavirus 2: NEGATIVE

## 2020-01-03 LAB — CK: Total CK: 934 U/L — ABNORMAL HIGH (ref 49–397)

## 2020-01-03 LAB — GLUCOSE, CAPILLARY: Glucose-Capillary: 135 mg/dL — ABNORMAL HIGH (ref 70–99)

## 2020-01-03 LAB — APTT: aPTT: 52 seconds — ABNORMAL HIGH (ref 24–36)

## 2020-01-03 LAB — MAGNESIUM: Magnesium: 2 mg/dL (ref 1.7–2.4)

## 2020-01-03 LAB — CBG MONITORING, ED: Glucose-Capillary: 136 mg/dL — ABNORMAL HIGH (ref 70–99)

## 2020-01-03 MED ORDER — SODIUM CHLORIDE 0.9 % IV BOLUS
500.0000 mL | Freq: Once | INTRAVENOUS | Status: AC
  Administered 2020-01-03: 500 mL via INTRAVENOUS

## 2020-01-03 MED ORDER — WARFARIN SODIUM 7.5 MG PO TABS
7.5000 mg | ORAL_TABLET | Freq: Once | ORAL | Status: DC
  Filled 2020-01-03: qty 1

## 2020-01-03 MED ORDER — METOPROLOL TARTRATE 25 MG PO TABS
12.5000 mg | ORAL_TABLET | Freq: Once | ORAL | Status: AC
  Administered 2020-01-03: 12.5 mg via ORAL
  Filled 2020-01-03: qty 1

## 2020-01-03 MED ORDER — FLUTICASONE FUROATE-VILANTEROL 100-25 MCG/INH IN AEPB
1.0000 | INHALATION_SPRAY | Freq: Every day | RESPIRATORY_TRACT | Status: DC
  Administered 2020-01-05 – 2020-01-11 (×6): 1 via RESPIRATORY_TRACT
  Filled 2020-01-03 (×2): qty 28

## 2020-01-03 MED ORDER — METOPROLOL SUCCINATE ER 25 MG PO TB24: 25.0000 mg | ORAL_TABLET | Freq: Every day | ORAL | Status: DC

## 2020-01-03 MED ORDER — METOPROLOL SUCCINATE ER 25 MG PO TB24
25.0000 mg | ORAL_TABLET | Freq: Every day | ORAL | Status: DC
  Administered 2020-01-03 – 2020-01-04 (×2): 25 mg via ORAL
  Filled 2020-01-03 (×2): qty 1

## 2020-01-03 MED ORDER — DILTIAZEM HCL 30 MG PO TABS
30.0000 mg | ORAL_TABLET | Freq: Once | ORAL | Status: AC
  Administered 2020-01-03: 30 mg via ORAL
  Filled 2020-01-03: qty 1

## 2020-01-03 MED ORDER — POTASSIUM CHLORIDE CRYS ER 20 MEQ PO TBCR
20.0000 meq | EXTENDED_RELEASE_TABLET | Freq: Every day | ORAL | Status: DC
  Administered 2020-01-03 – 2020-01-08 (×6): 20 meq via ORAL
  Filled 2020-01-03 (×6): qty 1

## 2020-01-03 MED ORDER — ATORVASTATIN CALCIUM 40 MG PO TABS
40.0000 mg | ORAL_TABLET | Freq: Every day | ORAL | Status: DC
  Administered 2020-01-03 – 2020-01-10 (×8): 40 mg via ORAL
  Filled 2020-01-03 (×8): qty 1

## 2020-01-03 MED ORDER — FUROSEMIDE 20 MG PO TABS: 40.0000 mg | ORAL_TABLET | Freq: Every day | ORAL | Status: DC

## 2020-01-03 MED ORDER — WARFARIN - PHARMACIST DOSING INPATIENT: Freq: Every day | Status: DC

## 2020-01-03 MED ORDER — INSULIN ASPART 100 UNIT/ML ~~LOC~~ SOLN
0.0000 [IU] | Freq: Three times a day (TID) | SUBCUTANEOUS | Status: DC
  Administered 2020-01-04 – 2020-01-08 (×9): 1 [IU] via SUBCUTANEOUS
  Administered 2020-01-09: 2 [IU] via SUBCUTANEOUS
  Administered 2020-01-10 – 2020-01-11 (×3): 1 [IU] via SUBCUTANEOUS
  Administered 2020-01-11: 2 [IU] via SUBCUTANEOUS

## 2020-01-03 MED ORDER — DILTIAZEM HCL-DEXTROSE 125-5 MG/125ML-% IV SOLN (PREMIX)
5.0000 mg/h | INTRAVENOUS | Status: DC
  Administered 2020-01-03 – 2020-01-04 (×2): 5 mg/h via INTRAVENOUS
  Filled 2020-01-03 (×3): qty 125

## 2020-01-03 MED ORDER — DILTIAZEM LOAD VIA INFUSION
10.0000 mg | Freq: Once | INTRAVENOUS | Status: AC
  Administered 2020-01-03: 10 mg via INTRAVENOUS
  Filled 2020-01-03: qty 10

## 2020-01-03 MED ORDER — INSULIN ASPART 100 UNIT/ML ~~LOC~~ SOLN: 0.0000 [IU] | Freq: Every day | SUBCUTANEOUS | Status: DC

## 2020-01-03 MED ORDER — HALOPERIDOL LACTATE 5 MG/ML IJ SOLN
5.0000 mg | Freq: Three times a day (TID) | INTRAMUSCULAR | Status: DC | PRN
  Filled 2020-01-03: qty 1

## 2020-01-03 NOTE — ED Notes (Signed)
Hospital Bed Ordered @ [phone].

## 2020-01-03 NOTE — ED Provider Notes (Signed)
[facility] EMERGENCY DEPARTMENT Provider Note   CSN: [id number] Arrival date & time: 01/02/20  1455     History Chief Complaint  Patient presents with  . Fall    ROC STREETT is a 79 y.o. male.  Patient with history of atrial fibrillation on coumadin, HLD HTN, GERD, CHF, COPD, CAD s/p CABG/STENTS, OSA, T2DM who lives alone, presents after being found on the floor of his home by his daughter last evening (01/01/20). It is unknown how long he was on the floor. Daughter reports she thinks he fell in the kitchen and crawled into the living room where she found him. No wound or bleeding. The patient cannot provide details of his fall. He denies current pain, SOB, nausea, headache or visual change.   The history is provided by the patient. No language interpreter was used.  Fall       Past Medical History:  Diagnosis Date  . Arthritis   . Atrial fibrillation ([place])   . CAD (coronary artery disease) 2012   [place]-LAD 2 or 3 srents  . Complication of anesthesia    "loopy after polpy removal dec 2017, lasted several days"  . COPD (chronic obstructive pulmonary disease) ([place])   . Dementia ([place])   . GERD (gastroesophageal reflux disease)   . HEART FAILURE, CONGESTIVE UNSPEC 02/23/2007   Qualifier: Diagnosis of  By: Mellody Drown MD, [facility]    . Hyperlipidemia   . Hypertension   . OSA (obstructive sleep apnea) 11/13/2014  . S/P AVR    #25 mm Edwards pericardial valve Bioprosthetic. Dr Cyndia Bent.  . Situational depression    "son passed 11/2013"  . Sleep apnea    occ uses c pap does not know settings   . Stented coronary artery 2013  . TOBACCO DEPENDENCE 07/29/2006   Qualifier: History of  By: Carlena Sax  MD, [facility] [place]    . Type II diabetes mellitus [facility])     Patient Active Problem List   Diagnosis Date Noted  . Cerumen impaction 11/23/2019  . Weight loss 11/21/2019  . Adenomatous rectal polyp with high grade dysplasia s/p TEM resection 05/26/2016 05/26/2016  . Spinal  stenosis of lumbar region 11/28/2015  . OSA (obstructive sleep apnea) 11/13/2014  . Pulmonary HTN ([place]) 08/24/2014  . Acute on chronic congestive heart failure with left ventricular diastolic dysfunction ([place]) 08/02/2014  . Cognitive impairment 07/02/2014  . S/P AVR 12/29/2012  . Atrial fibrillation ([place])   . Type 2 diabetes mellitus with diabetic neuropathy, unspecified ([place]) 11/25/2011  . Vitamin D deficiency 02/21/2010  . Primary hyperparathyroidism ([place]) 06/04/2009  . Hypercalcemia 05/29/2009  . Essential hypertension 02/23/2007  . Coronary atherosclerosis 12/15/2006  . GERD 12/15/2006  . COPD (chronic obstructive pulmonary disease) ([place]) 12/10/2006  . HLD (hyperlipidemia) 07/29/2006  . Alcohol abuse 07/29/2006  . Aortic valve disorder 07/29/2006    Past Surgical History:  Procedure Laterality Date  . AORTIC VALVE REPLACEMENT  07/2010   notes 07/28/2010  . CARDIAC CATHETERIZATION  06/2010   Archie Endo 07/01/2010  . COLONOSCOPY WITH PROPOFOL N/A 02/08/2017   Procedure: COLONOSCOPY WITH PROPOFOL;  Surgeon: Doran Stabler, MD;  Location: [facility] ENDOSCOPY;  Service: Gastroenterology;  Laterality: N/A;  . CORONARY ANGIOPLASTY WITH STENT PLACEMENT  2006; 10/2006   "2"; 1/notes 10/01/2010  . CORONARY ARTERY BYPASS GRAFT  07/2010   "1"/notes 07/28/2010  . PARTIAL PROCTECTOMY BY TEM N/A 05/26/2016   Procedure: TEM PARTIAL PROCTECTOMY OF RECTAL MASS;  Surgeon: [name], MD;  Location: [facility] ORS;  Service: General;  Laterality: N/A;       Family History  Problem Relation Age of Onset  . Heart disease Mother   . Colon cancer Neg Hx   . Esophageal cancer Neg Hx   . Stomach cancer Neg Hx   . Pancreatic cancer Neg Hx   . Liver disease Neg Hx   . Colon polyps Neg Hx     Social History   Tobacco Use  . Smoking status: Former Smoker    Packs/day: 0.50    Years: 46.00    Pack years: 23.00    Types: Cigarettes  . Smokeless tobacco: Never Used  Vaping Use  . Vaping Use: Never used    Substance Use Topics  . Alcohol use: No  . Drug use: No    Home Medications Prior to Admission medications   Medication Sig Start Date End Date Taking? Authorizing Provider  atorvastatin (LIPITOR) 40 MG tablet Take 1 tablet (40 mg total) by mouth daily at 6 PM. TAKE 1 TABLET DAILY AT 6 PM 03/23/19   Donnamae Jude, MD  benazepril (LOTENSIN) 20 MG tablet Take 1 tablet (20 mg total) by mouth daily. 03/23/19   Donnamae Jude, MD  budesonide-formoterol [facility]) 80-4.5 MCG/ACT inhaler Inhale 2 puffs into the lungs 2 (two) times daily. 03/23/19   Donnamae Jude, MD  furosemide (LASIX) 40 MG tablet Take 1 tablet (40 mg total) by mouth 2 (two) times daily. 03/23/19   Donnamae Jude, MD  glucose blood test strip USE TO CHECK BLOOD SUGARS FOUR TIMES DAILY 07/05/18   Donnamae Jude, MD  metoprolol succinate (TOPROL-XL) 25 MG 24 hr tablet Take 1 tablet (25 mg total) by mouth daily. 03/23/19   Donnamae Jude, MD  Multiple Vitamins-Minerals (MULTIVITAMIN WITH MINERALS) tablet Take 1 tablet by mouth every evening.    [provider]  nitroGLYCERIN (NITROSTAT) 0.4 MG SL tablet Place 1 tablet (0.4 mg total) under the tongue every 5 (five) minutes as needed. For chest pain Patient not taking: Reported on 08/29/2019 03/23/19   Donnamae Jude, MD  potassium chloride SA (KLOR-CON) 20 MEQ tablet Take 1 tablet (20 mEq total) by mouth at bedtime. 03/23/19   Donnamae Jude, MD  vitamin C (ASCORBIC ACID) 500 MG tablet Take 1 tablet (500 mg total) by mouth every morning. 03/23/19   Donnamae Jude, MD  warfarin (COUMADIN) 5 MG tablet TAKE AS DIRECTED 10 MG THURSDAYA AND 7.5 MG ALL OTHER DAYS 03/23/19   Donnamae Jude, MD    Allergies    Pineapple concentrate  Review of Systems   Review of Systems  Constitutional: Negative for chills and fever.  HENT: Negative.   Respiratory: Negative.   Cardiovascular: Negative.   Gastrointestinal: Negative.   Musculoskeletal: Negative.   Skin: Negative.    Neurological: Negative.     Physical Exam Updated Vital Signs BP (!) 161/125   Pulse 66   Temp 98 F (36.7 C)   Resp 12   SpO2 97%   Physical Exam Vitals and nursing note reviewed.  Constitutional:      Appearance: He is well-developed.  HENT:     Head: Normocephalic and atraumatic.     Right Ear: Tympanic membrane normal.     Left Ear: Tympanic membrane normal.     Nose: Nose normal.  Eyes:     Conjunctiva/sclera: Conjunctivae normal.  Cardiovascular:     Rate and Rhythm: Normal rate and regular rhythm.  Pulses: Normal pulses.     Heart sounds: No murmur heard.   Pulmonary:     Effort: Pulmonary effort is normal.     Breath sounds: Normal breath sounds. No wheezing, rhonchi or rales.  Chest:     Chest wall: No tenderness.  Abdominal:     General: Bowel sounds are normal.     Palpations: Abdomen is soft.     Tenderness: There is no abdominal tenderness. There is no guarding or rebound.  Musculoskeletal:        General: No swelling, tenderness or deformity. Normal range of motion.     Cervical back: Normal range of motion and neck supple.     Right lower leg: No edema.     Left lower leg: No edema.  Skin:    General: Skin is warm and dry.     Findings: No rash.     Comments: Superficial abrasion to posterior left shoulder. Mild bruising to bilateral knees.   Neurological:     Mental Status: He is alert and oriented to person, place, and time.     ED Results / Procedures / Treatments   Labs (all labs ordered are listed, but only abnormal results are displayed) Labs Reviewed  BASIC METABOLIC PANEL - Abnormal; Notable for the following components:      Result Value   Glucose, Bld 160 (*)    Creatinine, Ser 1.29 (*)    Calcium 10.6 (*)    GFR calc non Af Amer 53 (*)    All other components within normal limits  CBC - Abnormal; Notable for the following components:   Platelets 149 (*)    All other components within normal limits  HEPATIC FUNCTION PANEL  - Abnormal; Notable for the following components:   AST 55 (*)    Total Bilirubin 2.3 (*)    Bilirubin, Direct 0.5 (*)    Indirect Bilirubin 1.8 (*)    All other components within normal limits  PROTIME-INR - Abnormal; Notable for the following components:   Prothrombin Time 26.1 (*)    INR 2.5 (*)    All other components within normal limits  APTT - Abnormal; Notable for the following components:   aPTT 52 (*)    All other components within normal limits  URINALYSIS, ROUTINE W REFLEX MICROSCOPIC  CBG MONITORING, ED    EKG None  Radiology CT HEAD WO CONTRAST  Result Date: 01/02/2020 CLINICAL DATA:  79 year old male with head trauma. EXAM: CT HEAD WITHOUT CONTRAST TECHNIQUE: Contiguous axial images were obtained from the base of the skull through the vertex without intravenous contrast. COMPARISON:  Head CT dated 09/12/2019. FINDINGS: Brain: Mild age-related atrophy and chronic microvascular ischemic changes. There is no acute intracranial hemorrhage. No mass effect or midline shift. No extra-axial fluid collection. Vascular: No hyperdense vessel or unexpected calcification. Skull: Normal. Negative for fracture or focal lesion. Sinuses/Orbits: No acute finding. Other: None IMPRESSION: 1. No acute intracranial pathology. 2. Mild age-related atrophy and chronic microvascular ischemic changes. Electronically Signed   By: Anner Crete M.D.   On: 01/02/2020 17:09    Procedures Procedures (including critical care time)  Medications Ordered in ED Medications  sodium chloride flush (NS) 0.9 % injection 3 mL (has no administration in time range)  sodium chloride 0.9 % bolus 500 mL (500 mLs Intravenous New Bag/Given 01/03/20 0518)    ED Course  I have reviewed the triage vital signs and the nursing notes.  Pertinent labs & imaging results that were  available during my care of the patient were reviewed by me and considered in my medical decision making (see chart for details).    MDM  Rules/Calculators/A&P                          Patient to ED with daughter who found him after unwitnessed fall, unknown time of fall or duration of downtime. On Coumadin.  The patient has no complaints. He denies pain. He cannot provide details of his fall. Daughters are concerned about generalized weakness, deconditioning, multiple falls. He has had similar presentations in the ED in the past and has been admitted to nursing facility for strengthening/rehab prior to re-discharge home. Daughters feel strongly that this needs to happen again.   No criteria found for medical admission. The patient remains alert on serial exams. PT consult requested to evaluate for safety in discharge.   Patient care signed out to [place], PA-C, pending PT recommendation for discharge.    Final Clinical Impression(s) / ED Diagnoses Final diagnoses:  None   1. Fall 2. Coagulopathy 3. Generalized weakness   Rx / DC Orders ED Discharge Orders    None       Dennie Bible 01/03/20 0720    Mesner, Corene Cornea, MD 01/04/20 0230

## 2020-01-03 NOTE — ED Notes (Signed)
Pt attempted to get up and leave. Pt's daughter contacted and informed pt that he needed to stay at the hospital. Pt placed in a recliner with remote. Pt stated that he will not leave until daughter comes to get him.

## 2020-01-03 NOTE — ED Notes (Signed)
If d/c please call Dedrea for ride home. If not her, call Derrek Gu.

## 2020-01-03 NOTE — ED Provider Notes (Signed)
At shift change, care assumed from [facility], [place]. See her note for full H&P.   Per her note, "Matthew Edwards is a 79 y.o. male.  Patient with history of atrial fibrillation on coumadin, HLD HTN, GERD, CHF, COPD, CAD s/p CABG/STENTS, OSA, T2DM who lives alone, presents after being found on the floor of his home by his daughter last evening (01/01/20). It is unknown how long he was on the floor. Daughter reports she thinks he fell in the kitchen and crawled into the living room where she found him. No wound or bleeding. The patient cannot provide details of his fall. He denies current pain, SOB, nausea, headache or visual change.   The history is provided by the patient. No language interpreter was used. "   Physical Exam  BP 119/78 (BP Location: Right Arm)   Pulse (!) 117   Temp 98 F (36.7 C)   Resp 17   SpO2 99%   Physical Exam Vitals and nursing note reviewed.  Constitutional:      Appearance: He is well-developed.  HENT:     Head: Normocephalic and atraumatic.  Eyes:     Conjunctiva/sclera: Conjunctivae normal.  Cardiovascular:     Rate and Rhythm: Tachycardia present.     Heart sounds: No murmur heard.      Comments: Irregularly irregular Pulmonary:     Effort: Pulmonary effort is normal. No respiratory distress.     Breath sounds: Normal breath sounds.  Abdominal:     General: Bowel sounds are normal.     Palpations: Abdomen is soft.     Tenderness: There is no abdominal tenderness. There is no guarding or rebound.  Musculoskeletal:     Cervical back: Neck supple.  Skin:    General: Skin is warm and dry.  Neurological:     Mental Status: He is alert.     Comments: Mental Status:  Alert, thought content appropriate, able to give a coherent history. Speech fluent without evidence of aphasia. Able to follow 2 step commands without difficulty.  Cranial Nerves:  II: pupils equal, round, reactive to light III,IV, VI: ptosis not present, extra-ocular motions intact  bilaterally  V,VII: smile symmetric, facial light touch sensation equal VIII: hearing grossly normal to voice  X: uvula elevates symmetrically  XI: bilateral shoulder shrug symmetric and strong XII: midline tongue extension without fassiculations Motor:  Normal tone. 5/5 strength of BUE and BLE major muscle groups including strong and equal grip strength and dorsiflexion/plantar flexion Sensory: light touch normal in all extremities.s       ED Course/Procedures     Procedures  Results for orders placed or performed during the hospital encounter of 78/29/56  Basic metabolic panel  Result Value Ref Range   Sodium 137 135 - 145 mmol/L   Potassium 4.1 3.5 - 5.1 mmol/L   Chloride 100 98 - 111 mmol/L   CO2 26 22 - 32 mmol/L   Glucose, Bld 160 (H) 70 - 99 mg/dL   BUN 17 8 - 23 mg/dL   Creatinine, Ser 1.29 (H) 0.61 - 1.24 mg/dL   Calcium 10.6 (H) 8.9 - 10.3 mg/dL   GFR calc non Af Amer 53 (L) >60 mL/min   GFR calc Af Amer >60 >60 mL/min   Anion gap 11 5 - 15  CBC  Result Value Ref Range   WBC 8.8 4.0 - 10.5 K/uL   RBC 4.96 4.22 - 5.81 MIL/uL   Hemoglobin 14.8 13.0 - 17.0 g/dL  HCT 46.8 39 - 52 %   MCV 94.4 80.0 - 100.0 fL   MCH 29.8 26.0 - 34.0 pg   MCHC 31.6 30.0 - 36.0 g/dL   RDW 13.3 11.5 - 15.5 %   Platelets 149 (L) 150 - 400 K/uL   nRBC 0.0 0.0 - 0.2 %  Urinalysis, Routine w reflex microscopic  Result Value Ref Range   Color, Urine AMBER (A) YELLOW   APPearance CLEAR CLEAR   Specific Gravity, Urine 1.025 1.005 - 1.030   pH 5.0 5.0 - 8.0   Glucose, UA NEGATIVE NEGATIVE mg/dL   Hgb urine dipstick NEGATIVE NEGATIVE   Bilirubin Urine NEGATIVE NEGATIVE   Ketones, ur 5 (A) NEGATIVE mg/dL   Protein, ur NEGATIVE NEGATIVE mg/dL   Nitrite NEGATIVE NEGATIVE   Leukocytes,Ua NEGATIVE NEGATIVE  Hepatic function panel  Result Value Ref Range   Total Protein 7.7 6.5 - 8.1 g/dL   Albumin 4.2 3.5 - 5.0 g/dL   AST 55 (H) 15 - 41 U/L   ALT 23 0 - 44 U/L   Alkaline Phosphatase  81 38 - 126 U/L   Total Bilirubin 2.3 (H) 0.3 - 1.2 mg/dL   Bilirubin, Direct 0.5 (H) 0.0 - 0.2 mg/dL   Indirect Bilirubin 1.8 (H) 0.3 - 0.9 mg/dL  Protime-INR  Result Value Ref Range   Prothrombin Time 26.1 (H) 11.4 - 15.2 seconds   INR 2.5 (H) 0.8 - 1.2  APTT  Result Value Ref Range   aPTT 52 (H) 24 - 36 seconds  Magnesium  Result Value Ref Range   Magnesium 2.0 1.7 - 2.4 mg/dL   CT HEAD WO CONTRAST  Result Date: 01/02/2020 CLINICAL DATA:  79 year old male with head trauma. EXAM: CT HEAD WITHOUT CONTRAST TECHNIQUE: Contiguous axial images were obtained from the base of the skull through the vertex without intravenous contrast. COMPARISON:  Head CT dated 09/12/2019. FINDINGS: Brain: Mild age-related atrophy and chronic microvascular ischemic changes. There is no acute intracranial hemorrhage. No mass effect or midline shift. No extra-axial fluid collection. Vascular: No hyperdense vessel or unexpected calcification. Skull: Normal. Negative for fracture or focal lesion. Sinuses/Orbits: No acute finding. Other: None IMPRESSION: 1. No acute intracranial pathology. 2. Mild age-related atrophy and chronic microvascular ischemic changes. Electronically Signed   By: Anner Crete M.D.   On: 01/02/2020 17:09   DG Chest Portable 1 View  Addendum Date: 01/03/2020   ADDENDUM REPORT: 01/03/2020 12:29 ADDENDUM: There is slight offset in the lateral left seventh rib which could represent a fracture however the appearance is similar to prior radiograph from August 2020. Recommend correlation for focal pain. Electronically Signed   By: Audie Pinto M.D.   On: 01/03/2020 12:29   Result Date: 01/03/2020 CLINICAL DATA:  Fall. Patient with history of atrial fibrillation on coumadin. EXAM: PORTABLE CHEST 1 VIEW COMPARISON:  Chest radiograph 01/10/2019 FINDINGS: Stable cardiomediastinal contours with enlarged heart size. Low lung volumes. Chronic mild linear opacities at the left lung base likely scarring or  atelectasis. Probable posterior right seventh rib fracture. IMPRESSION: Probable posterior right seventh rib fracture. No pneumothorax. Electronically Signed: By: Audie Pinto M.D. On: 01/03/2020 09:15   2:00 PM Cardiac monitoring reveals HR 120s afib with rvr (Rate & rhythm), as reviewed and interpreted by me. Cardiac monitoring was ordered due to afib with rvr and to monitor patient for dysrhythmia.   CRITICAL CARE Performed by: Rodney Booze   Total critical care time: 45 minutes  Critical care time was exclusive  of separately billable procedures and treating other patients.  Critical care was necessary to treat or prevent imminent or life-threatening deterioration.  Critical care was time spent personally by me on the following activities: development of treatment plan with patient and/or surrogate as well as nursing, discussions with consultants, evaluation of patient's response to treatment, examination of patient, obtaining history from patient or surrogate, ordering and performing treatments and interventions, ordering and review of laboratory studies, ordering and review of radiographic studies, pulse oximetry and re-evaluation of patient's condition.   MDM   Briefly, pt presenting for eval after a fall. He has apparently had multiple falls at home. He tells me he fell on some grease in his kitchen. He denies head trauma or other injuries.   At shift change, patient is pending physical therapy evaluation and recommendations.  Reviewed/interpreted labs thus far CBC is without leukocytosis BMP with mild elevation in creatinine, otherwise reassuring Liver enzymes very slightly elevated and bilirubin slightly elevated. INR is therapeutic  EKG shows A. fib with RVR  Chest x-ray personally reviewed/interpreted shows possible right seventh posterior rib fracture.  On my review, there is questionable rib fracture on the left side.  Discussed with radiology who agree reviewed  chest x-ray and prior chest x-ray and feels that this is consistent with his prior x-rays.  Patient is nontender in this area therefore low suspicion for acute rib fracture.  CT head does not show any acute intracranial pathology, shows some chronic microvascular ischemic changes  Patient has been in the emergency department for almost 24 hours and has been noted to be in persistent A. fib with RVR.  His home medications were ordered after my initial evaluation however he had persistent A. fib with RVR.  An oral dose of diltiazem was ordered but A. fib with RVR persisted.  Given persistent A. fib with RVR, diltiazem bolus and drip were initiated and we will admit the patient to the hospital for further evaluation and treatment.    1:22 PM CONSULT with Dr. Ouida Sills with family medicine service who accepts patient for admission.    Rodney Booze, PA-C 01/03/20 1413    Gareth Morgan, MD 01/03/20 [phone]

## 2020-01-03 NOTE — ED Notes (Signed)
PT at pt's bedside.

## 2020-01-03 NOTE — Progress Notes (Signed)
INTERIM PROGRESS NOTE  Attempted to call [name] (patient's daughter) at [phone]. Did not reach her. Wanted to provide updates regarding admission, physical therapy recommendations, as well as clarify emergency contact and ask about Code status.  Milus Banister, [place], PGY-3 01/03/2020 5:02 PM

## 2020-01-03 NOTE — Discharge Planning (Signed)
RNCM met with pt at bedside regarding disposition plan.  Pt from home alone with occasional visits from daughter.  Pt agreeable to skilled nursing facility placement to get stronger as he has visited some before and found it to be very helpful.  Physical Therapy accessing pt now.  Awaiting recommendations.

## 2020-01-03 NOTE — ED Notes (Signed)
Pt got up again. Pt informed that daughter was going to come to the hospital and that he needed to stay on the monitor until she came. Pt complied.

## 2020-01-03 NOTE — ED Notes (Signed)
Pt took a few bites of spaghetti, bread, and salad. Pt drank entire container of milk and took a few sips of water.

## 2020-01-03 NOTE — ED Notes (Signed)
Pt continues to try to get out of bed. Pt told to remain in bed. Pt complied.

## 2020-01-03 NOTE — ED Notes (Signed)
Pt trying to get out of bed. Pt's daughter called by Thurmond Butts - RN. Pt not easily redirected.

## 2020-01-03 NOTE — H&P (Addendum)
[facility] Admission History and Physical Service Pager: [phone]  Patient name: Matthew Edwards Medical record number: [id number] Date of birth: December 29, 1940 Age: 79 y.o. Gender: male  Primary Care Provider: Lind Covert, MD Consultants: None Code Status: FULL Preferred Emergency Contact: Daughter [name] [phone]   Chief Complaint: Fall, Altered Mental Status  Assessment and Plan: KHYRAN RIERA is a 79 y.o. male presenting s/p fall. PMH is significant for HTN, T2DM with neuropathy, primary hyperparathyroidism, HLD, hypercalcemia, alcohol abuse, aortic valve replacement, atrial fibrillation on Coumadin, OSA, COPD.  Altered Mental Status  Dementia  Fall  Unsteady Gait Patient presented to the ED s/p fall. History limited 2/2 patient with dementia, patient level 5 caveat. Per ED provider and nursing, patient lives alone and had unwitnessed fall 8/3. He was found down by family that live close by and check on him often. Unknown how long patient was down. EMS called, patient brought to [facility] ED. CK elevated to 934. Creatinine 1.29, baseline appears to be 1.2 within the last year. Family endorsed more recurrent falls recently and overall deconditioning. It appears that patient has gone to rehab in the past and family believes that he would benefit from this again. Patient is on coumadin s/p aortic valve replacement. It is unclear but appears as though patient has been managing his own medication. INR today 2.5, but has ranged as high as 6.8 and as low as 1.7 this year. Patient also found to have rapid ventricular rate while in Afib, possibly secondary to agitation or missed Metoprolol dosing. ED provider started Cardizem drip to decrease HR however HR increases when patient starts trying to get up or walk out of his room. CT head negative for intracranial bleed, with mild age-related atrophy and chronic microvascular ischemic changes, but no obvious  tumor or mass making structural and vascular causes less likely. Chest x-ray with probable posterior right seventh rib fracture. Patient does not complain of any pain. PT evaluated in ED and recommended SNF with supervision/assistance 24 hours. It is likely that patient has undiagnosed dementia. Other causes of AMS include metabolic, infectious, structural, vascular, drugs and psychiatric. Metabolic causes unlikely given glucose 160, electrolytes within normal limits/not overtly deranged. Infectious causes also unlikely given WBC 8.8, UA with no leukocytes, afebrile.  Patient does have history of alcohol abuse per chart, but low suspicion of acute intoxication. Fall likely multifactorial, related to dementia and generalized weakness. Patient repeats statements and only oriented to self. He is able to move all extremities spontaneously, does not appear in pain or distress. Do not feel he is safe to return home at this time. Patient is disoriented in the ED, safety sitter is at bedside as patient is unsteady on his feet, is trying to get up to leave, ripping out IV, acutely agitated. - admit to FPTS, medical telemetry, Dr. Nori Riis attending - PT consulted - recommend SNF; follow up OT eval - NPO until SLP eval  - Fall precautions - Haldol 5mg  IV q8 PRN - Vital signs per floor protocol - VTE ppx with home warfarin, dosing by pharmacy  - Will try to reach daughter again for additional history - Cardiac monitoring  - SLP evaluation as above  - Fall precautions as above - IV hydration until cleared by SLP - Daily CBC/BMP - 1:1 sitter  Atrial Fibrillation with RVR Patient with history of atrial fibrillation, rate controlled on metoprolol succinate 25mg . It appears as though patient missed a dose yesterday. Began to have  RVR in the Emergency department with HR 100-140 bpm. Continued to have persistent RVR despite home medication ordered or oral diltiazem. Given persistence, diltiazem bolus and drip were  initiated in ED. Also given Metoprolol tartrate 12.5mg  x1 in the ED. Of note patient's HR increases significantly when he becomes more agitated and has to be redirected by ED staff.   - Will order 12.5 metoprolol tartrate x1 - Discontinue Cardizem drip once able - Continuous cardiac monitoring - Continue home medications, metoprolol 25mg  daily   HTN, chronic Home medications notable for benazepril 20 mg daily, lasix 40mg  BID. BP on arrival 92/60, though has fluctuated as high as 161/125. Most recently 114/96 mmHg.  - Hold home benazapril and lasix due to low blood pressures, can add back on as needed - Monitor daily - Continue home Metoprolol succinate 25 daily  Hx of Type 2 Diabetes Mellitus  It does not appear that patient is on any home medications. Glucose today 160. Hgb A1c 7.5 in 2018 - Patient currently NPO, sensitive SSI - Can add carb modified diet when no longer NPO - CBG's daily to monitor  - Order HbA1c  Hx of Aortic Valve Repalcement  CAD: chronic, stable. Patient with history of cardiac catheterization 06/2010, coronary artery bypass graft and aortic valve replacement 07/28/2010, coronary angioplasty with stent placement 10/01/2010. On atorvastatin 40mg  qd, nitroglycerin PRN, metoprolol succinate 25mg  daily, benazepril 20mg  daily, warfarin 10mg  Thursdays and 7.5mg  all other days - Continue home Atorva and Metoprolol - Hold home Benazepril d/t low blood pressures, restart as needed - Daily INR, Warfarin dosed by pharmacy  Hx HLD Last lipid panel in 2017. Triglycerides elevated to 181, VLDL 36. Home medication notable for atorvastatin 40mg .  - Order lipid panel  - Continue home Atorvastatin  COPD: chronic, stable Per chart. Lungs clear to auscultation anteriorly today, unable to auscultate posterior fields (due to patient's lack of participation in physical exam) but patient appears to be breathing comfortably on room air, satting 100%. Speaking in complete sentences. Home  medication noteable for Symbicort BID daily. - Continue home medications (Breo per hospital formulary)  Hx of Alcohol Abuse Per chart review, patient with history of alcohol abuse. Unknown last use. Does not appear to be under the influence at this time. Speech is not slurred, no odor of alcohol.  - Ethanol level - CIWA's  FEN/GI: NPO until SLP evaluation Prophylaxis: Warfarin by pharmacy  Disposition: Inpatient, medical telemetry  History of Present Illness:  RAJON BISIG is a 79 y.o. male presenting s/p recent fall.   History obtained by ED provider and nurses: Patient found by family after a fall on 8/2. It is unknown how long patient was down. Daughters concerned due to recent falls and overall deconditioning. Patient lives at home. He did not remember the fall in the ED and complained of no pain. He was subsequently found to be in A.fib with RVR while in the Emergency Department.   ED Course: He received his home dose of metoprolol succinate but persisted with high rates. He then received oral diltiazem, still with no improvement. He was then given diltiazem bolus and started on a drip. CT Head negative for bleed. CXR with probable right posterior 7th rib fracture. PT saw patient in ED, recommend SNF with 24 hour supervision.  Review Of Systems:    Review of Systems  Unable to perform ROS: Dementia     Patient Active Problem List   Diagnosis Date Noted   Altered mental status 01/03/2020  Cerumen impaction 11/23/2019   Weight loss 11/21/2019   Adenomatous rectal polyp with high grade dysplasia s/p TEM resection 05/26/2016 05/26/2016   Spinal stenosis of lumbar region 11/28/2015   OSA (obstructive sleep apnea) 11/13/2014   Pulmonary HTN ([place]) 08/24/2014   Acute on chronic congestive heart failure with left ventricular diastolic dysfunction ([place]) 08/02/2014   Cognitive impairment 07/02/2014   S/P AVR 12/29/2012   Atrial fibrillation ([place])    Type 2 diabetes mellitus with  diabetic neuropathy, unspecified ([place]) 11/25/2011   Vitamin D deficiency 02/21/2010   Primary hyperparathyroidism ([place]) 06/04/2009   Hypercalcemia 05/29/2009   Essential hypertension 02/23/2007   Coronary atherosclerosis 12/15/2006   GERD 12/15/2006   COPD (chronic obstructive pulmonary disease) ([place]) 12/10/2006   HLD (hyperlipidemia) 07/29/2006   Alcohol abuse 07/29/2006   Aortic valve disorder 07/29/2006    Past Medical History: Past Medical History:  Diagnosis Date   Arthritis    Atrial fibrillation ([place])    CAD (coronary artery disease) 2012   [place]-LAD 2 or 3 srents   Complication of anesthesia    "loopy after polpy removal dec 2017, lasted several days"   COPD (chronic obstructive pulmonary disease) ([place])    Dementia (HCC)    GERD (gastroesophageal reflux disease)    HEART FAILURE, CONGESTIVE UNSPEC 02/23/2007   Qualifier: Diagnosis of  By: Mellody Drown MD, [facility]     Hyperlipidemia    Hypertension    OSA (obstructive sleep apnea) 11/13/2014   S/P AVR    #25 mm Edwards pericardial valve Bioprosthetic. Dr Cyndia Bent.   Situational depression    "son passed 11/2013"   Sleep apnea    occ uses c pap does not know settings    Stented coronary artery 2013   TOBACCO DEPENDENCE 07/29/2006   Qualifier: History of  By: Carlena Sax  MD, Stephanie     Type II diabetes mellitus [facility])     Past Surgical History: Past Surgical History:  Procedure Laterality Date   AORTIC VALVE REPLACEMENT  07/2010   notes 07/28/2010   CARDIAC CATHETERIZATION  06/2010   Archie Endo 07/01/2010   COLONOSCOPY WITH PROPOFOL N/A 02/08/2017   Procedure: COLONOSCOPY WITH PROPOFOL;  Surgeon: Doran Stabler, MD;  Location: [facility] ENDOSCOPY;  Service: Gastroenterology;  Laterality: N/A;   CORONARY ANGIOPLASTY WITH STENT PLACEMENT  2006; 10/2006   "2"; 1/notes 10/01/2010   CORONARY ARTERY BYPASS GRAFT  07/2010   "1"/notes 07/28/2010   PARTIAL PROCTECTOMY BY TEM N/A 05/26/2016   Procedure: TEM PARTIAL PROCTECTOMY OF RECTAL MASS;   Surgeon: [name], MD;  Location: [facility] ORS;  Service: General;  Laterality: N/A;    Social History: Social History   Tobacco Use   Smoking status: Former Smoker    Packs/day: 0.50    Years: 46.00    Pack years: 23.00    Types: Cigarettes   Smokeless tobacco: Never Used  Scientific laboratory technician Use: Never used  Substance Use Topics   Alcohol use: No   Drug use: No   Additional social history:  Please also refer to relevant sections of EMR.  Family History: Family History  Problem Relation Age of Onset   Heart disease Mother    Colon cancer Neg Hx    Esophageal cancer Neg Hx    Stomach cancer Neg Hx    Pancreatic cancer Neg Hx    Liver disease Neg Hx    Colon polyps Neg Hx     Allergies and Medications: Allergies  Allergen Reactions   Pineapple  Concentrate Nausea And Vomiting   No current facility-administered medications on file prior to encounter.   Current Outpatient Medications on File Prior to Encounter  Medication Sig Dispense Refill   atorvastatin (LIPITOR) 40 MG tablet Take 1 tablet (40 mg total) by mouth daily at 6 PM. TAKE 1 TABLET DAILY AT 6 PM (Patient taking differently: Take 40 mg by mouth daily at 6 PM. 6pm) 90 tablet 3   benazepril (LOTENSIN) 20 MG tablet Take 1 tablet (20 mg total) by mouth daily. 90 tablet 3   budesonide-formoterol (SYMBICORT) 80-4.5 MCG/ACT inhaler Inhale 2 puffs into the lungs 2 (two) times daily. 3 g 11   furosemide (LASIX) 40 MG tablet Take 1 tablet (40 mg total) by mouth 2 (two) times daily. (Patient taking differently: Take 40 mg by mouth daily. Suppose to be 40mg  bid but pt is taking 40mg  QD) 180 tablet 3   metoprolol succinate (TOPROL-XL) 25 MG 24 hr tablet Take 1 tablet (25 mg total) by mouth daily. 90 tablet 3   Multiple Vitamins-Minerals (MULTIVITAMIN WITH MINERALS) tablet Take 1 tablet by mouth every evening.     potassium chloride SA (KLOR-CON) 20 MEQ tablet Take 1 tablet (20 mEq total) by mouth at bedtime. 90 tablet 3    warfarin (COUMADIN) 5 MG tablet TAKE AS DIRECTED 10 MG THURSDAYA AND 7.5 MG ALL OTHER DAYS (Patient taking differently: Take 7.5-10 mg by mouth See admin instructions. 10mg  on Thursdays and 7.5mg  on all other days) 260 tablet 3   glucose blood test strip USE TO CHECK BLOOD SUGARS FOUR TIMES DAILY 400 each 3   nitroGLYCERIN (NITROSTAT) 0.4 MG SL tablet Place 1 tablet (0.4 mg total) under the tongue every 5 (five) minutes as needed. For chest pain (Patient not taking: Reported on 08/29/2019) 25 tablet 6    Objective: BP 104/78   Pulse (!) 135   Temp 98 F (36.7 C)   Resp (!) 27   SpO2 97%  Exam: General: Awake, alert and oriented x1 (self)  Eyes: moving spontaneously  ENTM: airway patent Neck: supple Cardiovascular: irregularly irregular Respiratory: clear to auscultation in anterior fields. Patient breathing comfortably on room air, speaking in complete sentences Gastrointestinal: non-tender in all quadrants, normoactive bowel sounds MSK: moving extremities spontaneously Derm: dry b/l lower extremities, midline vertical scar on chest Neuro: Exam limited 2/2 patient not  Psych: normal affect, demented  Labs and Imaging: CBC BMET  Recent Labs  Lab 01/02/20 1508  WBC 8.8  HGB 14.8  HCT 46.8  PLT 149*   Recent Labs  Lab 01/02/20 1508  NA 137  K 4.1  CL 100  CO2 26  BUN 17  CREATININE 1.29*  GLUCOSE 160*  CALCIUM 10.6*     EKG: A-fib with RVR rate 142  CT Head  IMPRESSION: 1. No acute intracranial pathology. 2. Mild age-related atrophy and chronic microvascular ischemic Changes.  Chest X-ray 1-view IMPRESSION: Probable posterior right seventh rib fracture. No pneumothorax. There is slight offset in the lateral left seventh rib which could represent a fracture however the appearance is similar to prior radiograph from August 2020. Recommend correlation for focal pain. Stable cardiomediastinal contours with enlarged heart size. Low lung volumes. Chronic mild linear  opacities at the left lung base likely scarring or atelectasis.   Sharion Settler, DO 01/03/2020, 3:38 PM PGY-1, [facility] Intern pager: [phone], text pages welcome    FPTS Upper-Level Resident Addendum I have independently interviewed and examined the patient. I have discussed  the above with the original author and agree with their documentation. My edits for correction/addition/clarification are italicized. Please see also any attending notes.    Milus Banister, DO PGY-2, [facility] 01/03/2020 5:04 PM  [place] Service pager: [phone] (text pages welcome through [facility])

## 2020-01-03 NOTE — Progress Notes (Signed)
ANTICOAGULATION CONSULT NOTE - Initial Consult  Pharmacy Consult for warfarin Indication: atrial fibrillation  Allergies  Allergen Reactions   Pineapple Concentrate Nausea And Vomiting    Patient Measurements: Height: 175.3 cm, 5'9"  Vital Signs: BP: 114/96 (08/04 1531) Pulse Rate: 109 (08/04 1531)  Labs: Recent Labs    01/02/20 1508 01/03/20 0210 01/03/20 0218  HGB 14.8  --   --   HCT 46.8  --   --   PLT 149*  --   --   APTT  --   --  52*  LABPROT  --   --  26.1*  INR  --   --  2.5*  CREATININE 1.29*  --   --   CKTOTAL  --  934*  --     CrCl cannot be calculated (Unknown ideal weight.).   Medical History: Past Medical History:  Diagnosis Date   Arthritis    Atrial fibrillation ([place])    CAD (coronary artery disease) 2012   [place]-LAD 2 or 3 srents   Complication of anesthesia    "loopy after polpy removal dec 2017, lasted several days"   COPD (chronic obstructive pulmonary disease) (HCC)    Dementia (HCC)    GERD (gastroesophageal reflux disease)    HEART FAILURE, CONGESTIVE UNSPEC 02/23/2007   Qualifier: Diagnosis of  By: Mellody Drown MD, [facility]     Hyperlipidemia    Hypertension    OSA (obstructive sleep apnea) 11/13/2014   S/P AVR    #25 mm Edwards pericardial valve Bioprosthetic. Dr Cyndia Bent.   Situational depression    "son passed 11/2013"   Sleep apnea    occ uses c pap does not know settings    Stented coronary artery 2013   TOBACCO DEPENDENCE 07/29/2006   Qualifier: History of  By: Carlena Sax  MD, Stephanie     Type II diabetes mellitus ([place])     Medications:  Scheduled:   atorvastatin  40 mg Oral q1800   fluticasone furoate-vilanterol  1 puff Inhalation Daily   furosemide  40 mg Oral Daily   insulin aspart  0-5 Units Subcutaneous QHS   insulin aspart  0-9 Units Subcutaneous TID WC   metoprolol succinate  25 mg Oral Daily   metoprolol tartrate  12.5 mg Oral Once   potassium chloride SA  20 mEq Oral QHS   Infusions:    diltiazem (CARDIZEM) infusion 7.5 mg/hr (01/03/20 1444)    Assessment: 79 yo male presents after an at-home fall. Patient has a history of atrial fibrillation in which they take warfarin. The patient is found to have persistant atrial fibrillation with RVR upon presentation. There are no wounds or bleeding and the patient denies pain, SOB, nausea, headache and visual changes. Pharmacy is now consulted to dose warfarin.  The patient's PTA warfarin dose is 10 mg Thursday and 7.5 mg on all other days. The patient did not get a warfarin dose on 01/02/20, the last dose was taken on 01/01/20 at an unknown time. The patient's CBC is currently WNL and their INR on 01/03/20 was 2.5. No bleeding is noted or documented.  Goal of Therapy:  INR 2-3 Monitor platelets by anticoagulation protocol: Yes   Plan:  Give warfarin PO 7.5 mg tonight Obtain daily PT/INR and CBC Monitor for signs and symptoms of bleeding  Shauna Hugh, PharmD, [place]  PGY-1 Pharmacy Resident 01/03/2020 4:31 PM  Please check [email or web address] for unit-specific pharmacy phone numbers.

## 2020-01-03 NOTE — Evaluation (Signed)
Physical Therapy Evaluation Patient Details Name: Matthew Edwards MRN: [id number] DOB: 1940/07/03  Today's Date: 01/03/2020   History of Present Illness  Patient is a 79 y/o male with history of atrial fibrillation on coumadin, HLD HTN, GERD, CHF, COPD, CAD s/p CABG/STENTS, OSA, T2DM who lives alone, presents after being found on the floor of his home by his daughter last evening (01/01/20). It is unknown how long he was on the floor. Daughter reports she thinks he fell in the kitchen and crawled into the living room where she found him. No wound or bleeding. The patient cannot provide details of his fall. He denies current pain, SOB, nausea, headache or visual change.   Clinical Impression  Pt admitted with/for fall and indeterminate amount of time on the floor.  Pt is confused and weak needing min to mod assist form sit to stand and min assist otherwise.Marland Kitchen  Pt currently limited functionally due to the problems listed. ( See problems list.)   Pt will benefit from PT to maximize function and safety in order to get ready for next venue listed below.     Follow Up Recommendations SNF;Supervision/Assistance - 24 hour    Equipment Recommendations  None recommended by PT (?3 in 1)    Recommendations for Other Services       Precautions / Restrictions Precautions Precautions: Fall      Mobility  Bed Mobility Overal bed mobility: Modified Independent                Transfers Overall transfer level: Needs assistance   Transfers: Sit to/from Stand Sit to Stand: Min assist;Mod assist (variable as he warmed up.)         General transfer comment: pt struggles to boost or translate his weight forward.  Maximal effort for pt to attain   Ambulation/Gait Ambulation/Gait assistance: Min guard Gait Distance (Feet): 100 Feet (x2 , 70 feet x1) Assistive device: Rolling walker (2 wheeled) Gait Pattern/deviations: Step-through pattern Gait velocity: moderate Gait velocity interpretation:  1.31 - 2.62 ft/sec, indicative of limited community ambulator General Gait Details: pt is mildly unsteady overall, with drifting and listing away from the direction he is scanning.  He whips his RW around 180*  to turn when he is really not stable enough.  Stairs            Wheelchair Mobility    Modified Rankin (Stroke Patients Only)       Balance Overall balance assessment: Needs assistance Sitting-balance support: No upper extremity supported;Bilateral upper extremity supported Sitting balance-Leahy Scale: Good     Standing balance support: Bilateral upper extremity supported;No upper extremity supported Standing balance-Leahy Scale: Fair Standing balance comment: statcally can maintain stance without holding                             Pertinent Vitals/Pain Pain Assessment: No/denies pain    Home Living Family/patient expects to be discharged to:: Private residence Living Arrangements: Alone Available Help at Discharge: Family;Available PRN/intermittently Type of Home: Apartment Home Access: Stairs to enter Entrance Stairs-Rails: Right;Left Entrance Stairs-Number of Steps: 2 Home Layout: One level Home Equipment: Walker - 2 wheels;Cane - single point      Prior Function Level of Independence: Independent with assistive device(s)               Hand Dominance        Extremity/Trunk Assessment   Upper Extremity Assessment Upper Extremity Assessment: Generalized weakness  Lower Extremity Assessment Lower Extremity Assessment: Generalized weakness (hip flexor weakness notable)       Communication   Communication: No difficulties  Cognition Arousal/Alertness: Awake/alert Behavior During Therapy: WFL for tasks assessed/performed Overall Cognitive Status: History of cognitive impairments - at baseline Area of Impairment: Orientation;Safety/judgement;Awareness;Problem solving                 Orientation Level: Situation;Place        Safety/Judgement: Decreased awareness of safety;Decreased awareness of deficits Awareness: Intellectual Problem Solving: Slow processing;Difficulty sequencing        General Comments General comments (skin integrity, edema, etc.): sats/HR at rest stable    Exercises     Assessment/Plan    PT Assessment Patient needs continued PT services;All further PT needs can be met in the next venue of care  PT Problem List Decreased strength;Decreased activity tolerance;Decreased balance;Decreased mobility;Decreased knowledge of use of DME;Decreased knowledge of precautions       PT Treatment Interventions      PT Goals (Current goals can be found in the Care Plan section)  Acute Rehab PT Goals Patient Stated Goal: Home, but pt agreed that rehab might make him stronger and safer PT Goal Formulation: With patient Time For Goal Achievement: 01/10/20 Potential to Achieve Goals: Good    Frequency Min 3X/week   Barriers to discharge Decreased caregiver support;Other (comment) (dtr unable to be around all the time.)      Co-evaluation               AM-PAC PT "6 Clicks" Mobility  Outcome Measure Help needed turning from your back to your side while in a flat bed without using bedrails?: None Help needed moving from lying on your back to sitting on the side of a flat bed without using bedrails?: None Help needed moving to and from a bed to a chair (including a wheelchair)?: A Lot Help needed standing up from a chair using your arms (e.g., wheelchair or bedside chair)?: A Lot Help needed to walk in hospital room?: A Little Help needed climbing 3-5 steps with a railing? : A Lot 6 Click Score: 17    End of Session   Activity Tolerance: Patient tolerated treatment well Patient left: in chair;with call bell/phone within reach (reinforce and demo'd use of call bell.) Nurse Communication: Mobility status PT Visit Diagnosis: Unsteadiness on feet (R26.81);Other abnormalities of  gait and mobility (R26.89);History of falling (Z91.81);Muscle weakness (generalized) (M62.81)    Time: 3762-8315 PT Time Calculation (min) (ACUTE ONLY): 33 min   Charges:   PT Evaluation $PT Eval Moderate Complexity: 1 Mod PT Treatments $Gait Training: 8-22 mins        01/03/2020  Ginger Carne., PT Acute Rehabilitation Services [phone]  (pager) [phone]  (office)  Tessie Fass Ozro Russett 01/03/2020, 2:07 PM

## 2020-01-03 NOTE — ED Notes (Signed)
Lunch Tray Ordered @ 1059.  

## 2020-01-04 ENCOUNTER — Encounter (HOSPITAL_COMMUNITY): Payer: Self-pay | Admitting: Family Medicine

## 2020-01-04 ENCOUNTER — Inpatient Hospital Stay (HOSPITAL_COMMUNITY): Payer: Medicare HMO

## 2020-01-04 DIAGNOSIS — I4891 Unspecified atrial fibrillation: Secondary | ICD-10-CM

## 2020-01-04 DIAGNOSIS — E1140 Type 2 diabetes mellitus with diabetic neuropathy, unspecified: Secondary | ICD-10-CM

## 2020-01-04 DIAGNOSIS — R4189 Other symptoms and signs involving cognitive functions and awareness: Secondary | ICD-10-CM

## 2020-01-04 DIAGNOSIS — Z952 Presence of prosthetic heart valve: Secondary | ICD-10-CM

## 2020-01-04 LAB — BASIC METABOLIC PANEL
Anion gap: 9 (ref 5–15)
BUN: 15 mg/dL (ref 8–23)
CO2: 28 mmol/L (ref 22–32)
Calcium: 10 mg/dL (ref 8.9–10.3)
Chloride: 99 mmol/L (ref 98–111)
Creatinine, Ser: 1.11 mg/dL (ref 0.61–1.24)
GFR calc Af Amer: >60 mL/min (ref >60)
GFR calc non Af Amer: >60 mL/min (ref >60)
Glucose, Bld: 122 mg/dL — ABNORMAL HIGH (ref 70–99)
Potassium: 4.2 mmol/L (ref 3.5–5.1)
Sodium: 136 mmol/L (ref 135–145)

## 2020-01-04 LAB — CBC
HCT: 36.8 % — ABNORMAL LOW (ref 39.0–52.0)
Hemoglobin: 12.1 g/dL — ABNORMAL LOW (ref 13.0–17.0)
MCH: 30.9 pg (ref 26.0–34.0)
MCHC: 32.9 g/dL (ref 30.0–36.0)
MCV: 94.1 fL (ref 80.0–100.0)
Platelets: 134 10*3/uL — ABNORMAL LOW (ref 150–400)
RBC: 3.91 MIL/uL — ABNORMAL LOW (ref 4.22–5.81)
RDW: 13.4 % (ref 11.5–15.5)
WBC: 6 10*3/uL (ref 4.0–10.5)
nRBC: 0 % (ref 0.0–0.2)

## 2020-01-04 LAB — GLUCOSE, CAPILLARY
Glucose-Capillary: 112 mg/dL — ABNORMAL HIGH (ref 70–99)
Glucose-Capillary: 142 mg/dL — ABNORMAL HIGH (ref 70–99)
Glucose-Capillary: 128 mg/dL — ABNORMAL HIGH (ref 70–99)
Glucose-Capillary: 138 mg/dL — ABNORMAL HIGH (ref 70–99)

## 2020-01-04 LAB — LIPID PANEL
Cholesterol: 93 mg/dL (ref 0–200)
HDL: 39 mg/dL — ABNORMAL LOW (ref >40)
LDL Cholesterol: 41 mg/dL (ref 0–99)
Total CHOL/HDL Ratio: 2.4 RATIO
Triglycerides: 67 mg/dL (ref <150)
VLDL: 13 mg/dL (ref 0–40)

## 2020-01-04 LAB — PROTIME-INR
INR: 2.7 — ABNORMAL HIGH (ref 0.8–1.2)
Prothrombin Time: 27.7 seconds — ABNORMAL HIGH (ref 11.4–15.2)

## 2020-01-04 LAB — HEMOGLOBIN A1C
Hgb A1c MFr Bld: 6.7 % — ABNORMAL HIGH (ref 4.8–5.6)
Mean Plasma Glucose: 145.59 mg/dL

## 2020-01-04 LAB — CK: Total CK: 347 U/L (ref 49–397)

## 2020-01-04 MED ORDER — METOPROLOL TARTRATE 12.5 MG HALF TABLET
12.5000 mg | ORAL_TABLET | Freq: Two times a day (BID) | ORAL | Status: DC
  Administered 2020-01-04: 12.5 mg via ORAL
  Filled 2020-01-04: qty 1

## 2020-01-04 MED ORDER — METOPROLOL TARTRATE 12.5 MG HALF TABLET
12.5000 mg | ORAL_TABLET | Freq: Once | ORAL | Status: AC
  Administered 2020-01-04: 12.5 mg via ORAL
  Filled 2020-01-04: qty 1

## 2020-01-04 MED ORDER — METOPROLOL SUCCINATE ER 25 MG PO TB24: 37.5000 mg | ORAL_TABLET | Freq: Every day | ORAL | Status: DC

## 2020-01-04 MED ORDER — WARFARIN SODIUM 5 MG PO TABS
5.0000 mg | ORAL_TABLET | Freq: Once | ORAL | Status: AC
  Administered 2020-01-04: 5 mg via ORAL
  Filled 2020-01-04: qty 1

## 2020-01-04 MED ORDER — METOPROLOL TARTRATE 25 MG PO TABS
25.0000 mg | ORAL_TABLET | Freq: Two times a day (BID) | ORAL | Status: DC
  Administered 2020-01-04 – 2020-01-05 (×3): 25 mg via ORAL
  Filled 2020-01-04 (×3): qty 1

## 2020-01-04 NOTE — TOC Initial Note (Addendum)
Transition of Care [facility]) - Initial/Assessment Note    Patient Details  Name: Matthew Edwards MRN: [id number] Date of Birth: 1941/04/13  Transition of Care [facility]) CM/SW Contact:    Vinie Sill, [place] Phone Number: 01/04/2020, 4:25 PM  Clinical Narrative:                  CSW spoke with patient's daughter,[name]. CSW introduced self and explained role. CSW discussed with [name] PT recommendation of short term rehab at [facility]. [name] expressed the patient lives home alone but has the support of his children. She believes the patient would benefit from short term rehab at [facility] and is agreeable to SNF placement. [facility] was preferred SNF, CSW explained they are not accepting ne admits at this time, other SNF choices are [facility]( daughter states he has been there before )and [facility].  Family states no other questions or concerns at this time. Family requested private room- patient has received covid vaccines.   CSW will continue to follow and assist with discharge planning.  Thurmond Butts, MSW, [place] Clinical Social Worker   Expected Discharge Plan: Skilled Nursing Facility Barriers to Discharge: Continued Medical Work up, Ship broker, SNF Pending bed offer   Patient Goals and CMS Choice        Expected Discharge Plan and Services Expected Discharge Plan: [place] In-house Referral: Clinical Social Work     Living arrangements for the past 2 months: Single Family Home                                      Prior Living Arrangements/Services Living arrangements for the past 2 months: Single Family Home Lives with:: Self Patient language and need for interpreter reviewed:: No        Need for Family Participation in Patient Care: Yes (Comment) Care giver support system in place?: Yes (comment)   Criminal Activity/Legal Involvement Pertinent to Current Situation/Hospitalization: No - Comment as needed  Activities of  Daily Living      Permission Sought/Granted Permission sought to share information with : Family Supports, Chartered certified accountant granted to share information with : Yes, Verbal Permission Granted  Share Information with NAME: Marcelino Campos  Permission granted to share info w AGENCY: SNFs  Permission granted to share info w Relationship: daughter  Permission granted to share info w Contact Information: [phone]  Emotional Assessment   Attitude/Demeanor/Rapport: Unable to Assess Affect (typically observed): Unable to Assess Orientation: : Oriented to Self Alcohol / Substance Use: Not Applicable Psych Involvement: No (comment)  Admission diagnosis:  Altered mental status [R41.82] Atrial fibrillation with RVR ([place]) [I48.91] Patient Active Problem List   Diagnosis Date Noted  . Altered mental status 01/03/2020  . Rhabdomyolysis 01/03/2020  . Cerumen impaction 11/23/2019  . Weight loss 11/21/2019  . Adenomatous rectal polyp with high grade dysplasia s/p TEM resection 05/26/2016 05/26/2016  . Spinal stenosis of lumbar region 11/28/2015  . OSA (obstructive sleep apnea) 11/13/2014  . Pulmonary HTN ([place]) 08/24/2014  . Acute on chronic congestive heart failure with left ventricular diastolic dysfunction ([place]) 08/02/2014  . Cognitive impairment 07/02/2014  . S/P AVR 12/29/2012  . Atrial fibrillation ([place])   . Type 2 diabetes mellitus with diabetic neuropathy, unspecified ([place]) 11/25/2011  . Vitamin D deficiency 02/21/2010  . Primary hyperparathyroidism ([place]) 06/04/2009  . Hypercalcemia 05/29/2009  . Essential hypertension 02/23/2007  . Coronary atherosclerosis 12/15/2006  .  GERD 12/15/2006  . COPD (chronic obstructive pulmonary disease) ([place]) 12/10/2006  . HLD (hyperlipidemia) 07/29/2006  . Alcohol abuse 07/29/2006  . Aortic valve disorder 07/29/2006   PCP:  Lind Covert, MD Pharmacy:   [facility], [place] [address] Phone: [phone] Fax: [phone]  [place] ([place]), [place] - [address] [place] [address] Phone: [phone] Fax: [phone]     Social Determinants of Health (SDOH) Interventions    Readmission Risk Interventions No flowsheet data found.

## 2020-01-04 NOTE — Evaluation (Signed)
Clinical/Bedside Swallow Evaluation Patient Details  Name: Matthew Edwards MRN: [id number] Date of Birth: 07-22-1940  Today's Date: 01/04/2020 Time: SLP Start Time (ACUTE ONLY): 6 SLP Stop Time (ACUTE ONLY): 1118 SLP Time Calculation (min) (ACUTE ONLY): 13 min  Past Medical History:  Past Medical History:  Diagnosis Date  . Arthritis   . Atrial fibrillation ([place])   . CAD (coronary artery disease) 2012   [place]-LAD 2 or 3 srents  . Complication of anesthesia    "loopy after polpy removal dec 2017, lasted several days"  . COPD (chronic obstructive pulmonary disease) ([place])   . Dementia ([place])   . GERD (gastroesophageal reflux disease)   . HEART FAILURE, CONGESTIVE UNSPEC 02/23/2007   Qualifier: Diagnosis of  By: Mellody Drown MD, [facility]    . Hyperlipidemia   . Hypertension   . OSA (obstructive sleep apnea) 11/13/2014  . S/P AVR    #25 mm Edwards pericardial valve Bioprosthetic. Dr Cyndia Bent.  . Situational depression    "son passed 11/2013"  . Sleep apnea    occ uses c pap does not know settings   . Stented coronary artery 2013  . TOBACCO DEPENDENCE 07/29/2006   Qualifier: History of  By: Carlena Sax  MD, [place]    . Type II diabetes mellitus ([place])    Past Surgical History:  Past Surgical History:  Procedure Laterality Date  . AORTIC VALVE REPLACEMENT  07/2010   notes 07/28/2010  . CARDIAC CATHETERIZATION  06/2010   Archie Endo 07/01/2010  . COLONOSCOPY WITH PROPOFOL N/A 02/08/2017   Procedure: COLONOSCOPY WITH PROPOFOL;  Surgeon: Doran Stabler, MD;  Location: [facility] ENDOSCOPY;  Service: Gastroenterology;  Laterality: N/A;  . CORONARY ANGIOPLASTY WITH STENT PLACEMENT  2006; 10/2006   "2"; 1/notes 10/01/2010  . CORONARY ARTERY BYPASS GRAFT  07/2010   "1"/notes 07/28/2010  . PARTIAL PROCTECTOMY BY TEM N/A 05/26/2016   Procedure: TEM PARTIAL PROCTECTOMY OF RECTAL MASS;  Surgeon: [name], MD;  Location: [facility] ORS;  Service: General;  Laterality: N/A;   HPI:  Pt is a 79 y.o. male with PMH significant  for HTN, T2DM with neuropathy, primary hyperparathyroidism, HLD, hypercalcemia, alcohol abuse, aortic valve replacement, atrial fibrillation on Coumadin, OSA, COPD who presented s/p fall. CT head negative for acute changes. CXR 8/4: Chronic mild linear opacities at the left lung base likely scarring or atelectasis   Assessment / Plan / Recommendation Clinical Impression  Pt was seen for bedside swallow evaluation and he denied a history of dysphagia. Pt was pleasantly confused but able to participate in the oral mechanism exam which was [facility]; dentition adequate with some missing teeth. He tolerated all solids and liquids without signs or symptoms of oropharyngeal dysphagia. A regular texture diet with thin liquids is recommended at this time and further skilled SLP services are not clinically indicated for swallowing.  SLP Visit Diagnosis: Dysphagia, unspecified (R13.10)    Aspiration Risk  No limitations    Diet Recommendation Regular;Thin liquid   Liquid Administration via: Cup;Straw Medication Administration: Whole meds with liquid Supervision: Patient able to self feed Postural Changes: Seated upright at 90 degrees    Other  Recommendations Oral Care Recommendations: Oral care BID   Follow up Recommendations None      Frequency and Duration            Prognosis        Swallow Study   General Date of Onset: 01/03/20 HPI: Pt is a 79 y.o. male with PMH significant for HTN, T2DM with  neuropathy, primary hyperparathyroidism, HLD, hypercalcemia, alcohol abuse, aortic valve replacement, atrial fibrillation on Coumadin, OSA, COPD who presented s/p fall. CT head negative for acute changes. CXR 8/4: Chronic mild linear opacities at the left lung base likely scarring or atelectasis Type of Study: Bedside Swallow Evaluation Previous Swallow Assessment: None Diet Prior to this Study: NPO Temperature Spikes Noted: No Respiratory Status: Room air History of Recent Intubation:  No Behavior/Cognition: Alert;Cooperative;Pleasant mood;Confused Oral Cavity Assessment: Within Functional Limits Oral Care Completed by SLP: No Oral Cavity - Dentition: Adequate natural dentition Vision: Functional for self-feeding Self-Feeding Abilities: Able to feed self Patient Positioning: Upright in bed;Postural control adequate for testing Baseline Vocal Quality: Normal Volitional Cough: Strong Volitional Swallow: Able to elicit    Oral/Motor/Sensory Function Overall Oral Motor/Sensory Function: Within functional limits   Ice Chips Ice chips: Within functional limits Presentation: Spoon   Thin Liquid Thin Liquid: Within functional limits Presentation: Cup;Straw    Nectar Thick Nectar Thick Liquid: Not tested   Honey Thick Honey Thick Liquid: Not tested   Puree Puree: Within functional limits Presentation: Spoon   Solid     Solid: Within functional limits Presentation:  (Administered by SLP)     Tobie Poet I. Hardin Negus, [place], [place] Office number [phone] Pager [phone]  Horton Marshall 01/04/2020,11:26 AM

## 2020-01-04 NOTE — NC FL2 (Signed)
[place] LEVEL OF CARE SCREENING TOOL     IDENTIFICATION  Patient Name: Matthew Edwards Birthdate: 1940/08/06 Sex: male Admission Date (Current Location): 01/02/2020  [facility] and [place] Number:  Herbalist and Address:  [facility] [place] [address], [place], [place] [address]      Provider Number: [id number]  Attending Physician Name and Address:  Martyn Malay, MD  Relative Name and Phone Number:  [name], daughter, [phone]    Current Level of Care: Hospital Recommended Level of Care: [place] Prior Approval Number:    Date Approved/Denied:   PASRR Number: [id number]  Discharge Plan: SNF    Current Diagnoses: Patient Active Problem List   Diagnosis Date Noted  . Altered mental status 01/03/2020  . Rhabdomyolysis 01/03/2020  . Cerumen impaction 11/23/2019  . Weight loss 11/21/2019  . Adenomatous rectal polyp with high grade dysplasia s/p TEM resection 05/26/2016 05/26/2016  . Spinal stenosis of lumbar region 11/28/2015  . OSA (obstructive sleep apnea) 11/13/2014  . Pulmonary HTN ([place]) 08/24/2014  . Acute on chronic congestive heart failure with left ventricular diastolic dysfunction ([place]) 08/02/2014  . Cognitive impairment 07/02/2014  . S/P AVR 12/29/2012  . Atrial fibrillation ([place])   . Type 2 diabetes mellitus with diabetic neuropathy, unspecified ([place]) 11/25/2011  . Vitamin D deficiency 02/21/2010  . Primary hyperparathyroidism ([place]) 06/04/2009  . Hypercalcemia 05/29/2009  . Essential hypertension 02/23/2007  . Coronary atherosclerosis 12/15/2006  . GERD 12/15/2006  . COPD (chronic obstructive pulmonary disease) ([place]) 12/10/2006  . HLD (hyperlipidemia) 07/29/2006  . Alcohol abuse 07/29/2006  . Aortic valve disorder 07/29/2006    Orientation RESPIRATION BLADDER Height & Weight     Self, Time, Place  Normal Continent Weight: 207 lb 14.3 oz (94.3 kg) Height:     BEHAVIORAL SYMPTOMS/MOOD  NEUROLOGICAL BOWEL NUTRITION STATUS      Continent Diet (Please see DC Summary)  AMBULATORY STATUS COMMUNICATION OF NEEDS Skin   Limited Assist Verbally Normal                       Personal Care Assistance Level of Assistance  Bathing, Feeding, Dressing Bathing Assistance: Limited assistance Feeding assistance: Limited assistance Dressing Assistance: Limited assistance     Functional Limitations Info  Sight, Hearing, Speech Sight Info: Adequate Hearing Info: Adequate Speech Info: Adequate    SPECIAL CARE FACTORS FREQUENCY  PT (By licensed PT), OT (By licensed OT)     PT Frequency: 5x/week OT Frequency: 5x/week            Contractures Contractures Info: Not present    Additional Factors Info  Code Status, Allergies, Insulin Sliding Scale Code Status Info: Full Allergies Info: Pineapple concentrate   Insulin Sliding Scale Info: See dc summary for dose       Current Medications (01/04/2020):  This is the current hospital active medication list Current Facility-Administered Medications  Medication Dose Route Frequency Provider Last Rate Last Admin  . atorvastatin (LIPITOR) tablet 40 mg  40 mg Oral q1800 Milus Banister C, DO   40 mg at 01/03/20 2003  . fluticasone furoate-vilanterol (BREO ELLIPTA) 100-25 MCG/INH 1 puff  1 puff Inhalation Daily Milus Banister C, DO      . insulin aspart (novoLOG) injection 0-9 Units  0-9 Units Subcutaneous TID WC Milus Banister C, DO   1 Units at 01/04/20 1249  . metoprolol tartrate (LOPRESSOR) tablet 25 mg  25 mg Oral BID Dorris Singh  M, MD      . potassium chloride SA (KLOR-CON) CR tablet 20 mEq  20 mEq Oral QHS Milus Banister C, DO   20 mEq at 01/03/20 2042  . Warfarin - Pharmacist Dosing Inpatient   Does not apply q1600 Darlina Sicilian [facility]   Given at 01/04/20 1553     Discharge Medications: Please see discharge summary for a list of discharge medications.  Relevant Imaging Results:  Relevant Lab  Results:   Additional Information SS: [id number]  Benard Halsted, LCSW

## 2020-01-04 NOTE — Progress Notes (Signed)
HR in 130s. Notified MD. No new orders at this time.  Era Bumpers, RN

## 2020-01-04 NOTE — Progress Notes (Signed)
Interim Progress Note  S: Went to reassess patient. Patient with no chest pain, SOB. Denies palpitations. Patient states that he would like to get up and walk to stretch his legs. Spoke with the RN who states she will try to walk him or pass the message along to the night team to walk him. Patient's daughter not in room. Per nurse, daughter was not feeling well but was going to try and come in later. D/w nurse that daughter can spend the night with patient if she is able to be at bedside.   O:  Blood pressure 111/77, pulse (!) 111, temperature 98 F (36.7 C), resp. rate 20, weight 94.3 kg, SpO2 95 %. Gen: Awake, in no distress, watching television Cardio: irregularly irregular, tachy, rate 120's    A/P:  Atrial Fibrillation with RVR S/p metoprolol tartrate 12.5 mg x2. HR ranging 110's-120's while in room. Patient appears stable and remains asymptomatic. We will continue to monitor.   Unsteady Gait  Dementia We have d/c 1:1 sitter at this time, delirium precautions ordered. Hopefully the daughter will come today to stay with the patient. I believe this will help to orient the patient more and to prevent him from attempting to get up and out of bed. We are still in the process of finding SNF placement for patient.

## 2020-01-04 NOTE — Progress Notes (Signed)
HR sustaining 130-140. Notified MD. Metoprolol 12.5 given. Will continue to monitor.  Era Bumpers, RN

## 2020-01-04 NOTE — Progress Notes (Signed)
Interim progress note  Spoke with patient's nurse, patient is resting and relaxing in his room, doing well.  Heart rate on the monitor jumped up to 136, patient is asymptomatic.  Cardizem drip has been turned off.  We will add on additional one-time dose of metoprolol 12.5.  Additionally, patient currently alert and oriented, will discontinue one-to-one Air cabin crew.  Additionally, patient's family was contacted, they are coming to visit him and sit with him today.   Milus Banister, [place], PGY-3 01/04/2020 3:41 PM

## 2020-01-04 NOTE — Progress Notes (Addendum)
Pt's daughter, Ms.Richard Miu, came to visit this evenning and stated she's unable to stay with Pt tonight due to her hemodialysis schedule tomorrow at 06:30 am. She left at 10:30 pm. We do not have a Air cabin crew or family member supervision tonight. His room is next to the nurse station with staff close supervision.  Pt appears confused at situations and time. He's oriented to him self and place. He's able to follow commands, cooperative, but somtimes attempts to get out of bed and pulls EKG monitor repeatedly. EKG shows Atrial fibrillation, HR 80s-110, BP 111/77 - 112/ 83 mmHg. He remains afebrile. SPO2 95-96% on room air. Denies chest pain. No immediate distress. We will continue to monitor.  Kennyth Lose, RN

## 2020-01-04 NOTE — Hospital Course (Addendum)
Matthew Edwards is a 79 y.o. male presenting s/p fall. PMH is significant for HTN, T2DM with neuropathy, primary hyperparathyroidism, HLD, hypercalcemia, alcohol abuse, aortic valve replacement, atrial fibrillation on Coumadin, OSA, COPD. Below is his hospital course listed by problem. Refer to H&P for additional information.   Possible Dementia  Fall with elevated CK and left shoulder pain  Unsteady Gait  Patient presented s/p unwitnessed fall. Concern for overall deconditioning. He lives alone and appears to have been down for several hours prior to daughter finding patient. CK elevated 934, decreased after fluids given to 347. Did not trend CK further. CXR in ED significant for possible right posterior rib fracture. Patient also found to be in atrial fibrillation with RVR in ED (more details below). Patient disoriented in the ED, actively pulling out IV. Received 5mg  Haldol x1.  1:1 safety sitter for the first day but later discontinued due to improved orientation and patient redirectable. Patient complained of left shoulder pain as well from fall but x-ray negative for fracture or dislocation. Did have left upper back wound, bacitracin and dressing applied to area. Wound monitored. Received Seroquel 50 mg x1 for additional agitation with relief. Orientation waxed and waned, some days patient oriented to time, place and situation, other days he was not. Delirium precautions were placed. Reversible causes for dementia were assessed and negative including HIV, Syphilis, Thyroid, B12. Started on Aricept 5mg .   Atrial Fibrillation with RVR A. Fib with RVR in ED. Home medications: Metoprolol succinate 25mg  given without improvement.  Oral dose of diltiazem without relief so diltiazem bolus and drip were given. Patient still had elevated HR and required additional dose of metoprolol tartrate 12.5mg  x2. He was titrated off diltiazem drip and metoprolol tartrate initially started at 25 BID, then increased to  37.5 mg BID due to increased HR again. Patient with one 20-run V-Tach 8/7 with HR to 160's. Asymptomatic but given additional 37.5 mg metorpolol tartrate and HR decreased to 105bpm. Echo ordered, EF 60-65%, moderate mitral stenosis, moderate PAH. Referral to cardiology placed at time of d/c. D/c on Metoprolol succinate 25 mg.   HTN, chronic, stable Patient with previously diagnosed HTN. Home medications include Benazepril 20 mg daily, lasix 40mg  BID. Medications held throughout admission*** due to soft pressures. Strict I/O daily, net output *** on discharge.   Issues for Follow Up: 1. Repeat Bili levels outpatient. Consider RUQ ultrasound. Suspect 2/2 to mild hemolytic anemia from mechanical valve. 2. Refer to cardiology for f/u: mitral valve stenosis, moderate PAH on echo- likely due to OSA. Consider beta-blocker for improved heart rate 3. Follow up left-shoulder wound. 4. PRN Lasix if 3lb increase in one day of 5 or greater lb increase in a week. Potassium supplement on days that Lasix is given. 5. D/c on Metoprolol succinate *** mg.

## 2020-01-04 NOTE — Progress Notes (Addendum)
Spoke with patients daughter, [name], states that patient has some dementia and has problems with orientation at baseline. She tells me that she can come to stay with the patient this afternoon around 3-4PM. Patient was previously on Glipizide for his diabetes that has since been discontinued due to concern for hypoglycemia. Daughter also states that patient drinks two 8oz glasses of red wine every other day. CIWA's have been ordered. In regards to SNF placement, she prefers [facility] or [facility] if available.

## 2020-01-04 NOTE — Progress Notes (Addendum)
ANTICOAGULATION CONSULT NOTE - Initial Consult  Pharmacy Consult for warfarin Indication: atrial fibrillation  Allergies  Allergen Reactions   Pineapple Concentrate Nausea And Vomiting    Patient Measurements: Height: 175.3 cm, 5'9"  Vital Signs: Temp: 97.6 F (36.4 C) (08/05 1300) Temp Source: Axillary (08/05 1300) BP: 113/64 (08/05 1300) Pulse Rate: 99 (08/05 1300)  Labs: Recent Labs    01/02/20 1508 01/03/20 0210 01/03/20 0218 01/04/20 0642 01/04/20 1125  HGB 14.8  --   --  12.1*  --   HCT 46.8  --   --  36.8*  --   PLT 149*  --   --  134*  --   APTT  --   --  52*  --   --   LABPROT  --   --  26.1* 27.7*  --   INR  --   --  2.5* 2.7*  --   CREATININE 1.29*  --   --  1.11  --   CKTOTAL  --  934*  --   --  347    CrCl cannot be calculated (Unknown ideal weight.).   Medical History: Past Medical History:  Diagnosis Date   Arthritis    Atrial fibrillation ([place])    CAD (coronary artery disease) 2012   [place]-LAD 2 or 3 srents   Complication of anesthesia    "loopy after polpy removal dec 2017, lasted several days"   COPD (chronic obstructive pulmonary disease) ([facility])    Dementia ([facility])    GERD (gastroesophageal reflux disease)    HEART FAILURE, CONGESTIVE UNSPEC 02/23/2007   Qualifier: Diagnosis of  By: Mellody Drown MD, [facility]     Hyperlipidemia    Hypertension    OSA (obstructive sleep apnea) 11/13/2014   S/P AVR    #25 mm Edwards pericardial valve Bioprosthetic. Dr Cyndia Bent.   Situational depression    "son passed 11/2013"   Sleep apnea    occ uses c pap does not know settings    Stented coronary artery 2013   TOBACCO DEPENDENCE 07/29/2006   Qualifier: History of  By: Carlena Sax  MD, [place]     Type II diabetes mellitus ([place])     Medications:  Scheduled:   atorvastatin  40 mg Oral q1800   fluticasone furoate-vilanterol  1 puff Inhalation Daily   insulin aspart  0-9 Units Subcutaneous TID WC   [START ON 01/05/2020] metoprolol succinate   37.5 mg Oral Daily   metoprolol tartrate  12.5 mg Oral BID   potassium chloride SA  20 mEq Oral QHS   warfarin  7.5 mg Oral Once   Warfarin - Pharmacist Dosing Inpatient   Does not apply q1600    Assessment: Patient is a 79 yo male presenting after an at-home fall with altered mental status. Patient has a history of atrial fibrillation which they take warfarin for. The patient was found to have persistant atrial fibrillation with RVR upon presentation requiring diltiazem drip initially. There are no wounds or bleeding and the patient denies pain, SOB, nausea, headache and visual changes. Pharmacy has been consulted to dose warfarin.  The patient's prior warfarin regimen is unknown and variable and could not be confirmed by patient or daughter per physician conversation. Patient indicated he has not been eating much at home. The patient's last warfarin dose was given on 01/01/20 at an unknown time per patient. The patient's CBC is currently WNL and their INR on 01/04/20 is 2.7. No bleeding is noted or documented.  Goal of Therapy:  INR 2-3 Monitor platelets by anticoagulation protocol: Yes   Plan:  Give warfarin PO 5mg  tonight Obtain daily PT/INR and monitor CBC Monitor for signs and symptoms of bleeding Monitor diet and oral intake Follow up with patient education and compliance training   Cephus Slater, PharmD, [place] [facility] Resident [phone] 01/04/2020 3:20 PM  Please check [email or web address] for unit-specific pharmacy phone numbers.

## 2020-01-04 NOTE — Progress Notes (Addendum)
Family Medicine Teaching Service Daily Progress Note Intern Pager: [phone]  Patient name: Matthew Edwards Medical record number: [id number] Date of birth: May 30, 1941 Age: 79 y.o. Gender: male  Primary Care Provider: Lind Covert, MD Consultants: None Code Status: FULL  Pt Overview and Major Events to Date:  Admitted 8/4  Assessment and Plan: Matthew Edwards is a 79 y.o. male presenting s/p fall. PMH is significant for HTN, T2DM with neuropathy, primary hyperparathyroidism, HLD, hypercalcemia, alcohol abuse, aortic valve replacement, atrial fibrillation on Coumadin, OSA, COPD.  Possible Dementia  Fall with elevated CK and left shoulder pain  Unsteady Gait  Multiple attempts to get out of bed, agitation impending care yesterday so given 5mg  Haldol x1. Has since been d/c and no other calls regarding patient getting up. Today patient appears more alert. He is able to tell me that he is at the hospital, knows the year and seems oriented to situation. Patient lives alone and states that his daughter comes by daily to fix his meals and prepare his medications. He does state that daughter is a dialysis patient which affects how frequently she is able to check up on him. Today he is reporting left shoulder pain. States that he fell on this shoulder, has soreness and difficulty moving it.  - Left shoulder x-ray  - PT recommends SNF placement, SW working to find placement for patient.  - Follow up OT eval - Patient more alert, tolerated bedside swallow without difficulty. Will order carb modified diet  - Fall precautions, 1:1 Air cabin crew. Will call daughter and ask if she is able to stay with him - Vital signs per floor protocol - Will try to reach daughter again for additional history - Cardiac monitoring  - SLP evaluation - Repeat CK - Continue IV hydration - Orthostatic vital signs - Daily CBC/BMP  Atrial Fibrillation with RVR Diltiazem drip completed. HR recorded 87-99 today.  In room, monitor ranging from 70-120's.  - Continuous cardiac monitoring - Continue metoprolol 25 mg daily   - Added Metoprolol tartrate 12.5 mg x1 this AM - Plan to titrate off diltiazem drip  HTN, chronic Home medications notable for benazepril 20 mg daily, lasix 40mg  BID. BP this AM 102/64. - Continue to HOLD home benazapril and lasix due to low blood pressures, will continue to monitor and can add back on as needed - Strict I/O - Monitor daily - Continue home Metoprolol succinate 25 daily  Hx of Type 2 Diabetes Mellitus  It does not appear that patient is on any home medications. CBG 112 this AM. Hgb A1c 6.7 this AM. - sensitive SSI, has not required thus far - Can add carb modified diet  - CBG's daily to monitor   Hx of Aortic Valve Repalcement  CAD: chronic, stable. Patient with history of cardiac catheterization 06/2010, coronary artery bypass graft and aortic valve replacement 07/28/2010, coronary angioplasty with stent placement 10/01/2010. On atorvastatin 40mg  qd, nitroglycerin PRN, metoprolol succinate 25mg  daily, benazepril 20mg  daily, warfarin 10mg  Thursdays and 7.5mg  all other days. AM INR 2.7 - Continue home Atorvastatin and Metoprolol - Hold home Benazepril d/t low blood pressures, restart as needed - Daily INR, Warfarin dosed by pharmacy  Hx HLD Last lipid panel in 2017- Triglycerides elevated to 181, VLDL 36. Home medication notable for atorvastatin 40mg . Repeat lipid panel WNL. - Continue home Atorvastatin  FEN/GI: Carb modified diet PPx: Warfarin, dosed by pharmacy  Disposition: SNF   Subjective:  Patient tells me today that he feels that  he is weak. He has done PT in the past and feels he could benefit again. His daughter, Matthew Edwards, normally checks up on him daily and manages his medications and food. He has not been eating much lately, just breakfast due to his daughter having to go to dialysis. He states that he normally eats two meals a day. He is complaining  of some right thoracic pain and left shoulder pain that he attributes to his fall. He believes he may have slipped on some grease on the floor. He does endorse feeling unsteady on his feet and lives alone.  Objective: Temp:  [97.4 F (36.3 C)-98.8 F (37.1 C)] 98 F (36.7 C) (08/05 0503) Pulse Rate:  [26-135] 104 (08/05 0503) Resp:  [17-27] 18 (08/05 0503) BP: (90-153)/(47-100) 102/64 (08/05 0503) SpO2:  [93 %-100 %] 94 % (08/05 0503) Physical Exam: General: Alert and oriented to self, place, time (knows year but not month) and situation Cardiovascular: Irregular rhythym, rate 104. no murmurs appreciated Respiratory: CTAB Abdomen: soft, nontender in all quadrants Extremities: no edema, 2+ PT b/l   Laboratory: Recent Labs  Lab 01/02/20 1508  WBC 8.8  HGB 14.8  HCT 46.8  PLT 149*   Recent Labs  Lab 01/02/20 1508 01/03/20 0210  NA 137  --   K 4.1  --   CL 100  --   CO2 26  --   BUN 17  --   CREATININE 1.29*  --   CALCIUM 10.6*  --   PROT  --  7.7  BILITOT  --  2.3*  ALKPHOS  --  81  ALT  --  23  AST  --  55*  GLUCOSE 160*  --     Imaging/Diagnostic Tests: None new.  Matthew Settler, DO 01/04/2020, 6:49 AM PGY-1, [place] Intern pager: [phone], text pages welcome

## 2020-01-05 DIAGNOSIS — M25512 Pain in left shoulder: Secondary | ICD-10-CM | POA: Diagnosis present

## 2020-01-05 DIAGNOSIS — J449 Chronic obstructive pulmonary disease, unspecified: Secondary | ICD-10-CM | POA: Diagnosis present

## 2020-01-05 DIAGNOSIS — E210 Primary hyperparathyroidism: Secondary | ICD-10-CM | POA: Diagnosis present

## 2020-01-05 DIAGNOSIS — S2231XA Fracture of one rib, right side, initial encounter for closed fracture: Secondary | ICD-10-CM | POA: Diagnosis present

## 2020-01-05 DIAGNOSIS — R748 Abnormal levels of other serum enzymes: Secondary | ICD-10-CM | POA: Diagnosis present

## 2020-01-05 DIAGNOSIS — Z953 Presence of xenogenic heart valve: Secondary | ICD-10-CM | POA: Diagnosis not present

## 2020-01-05 DIAGNOSIS — I4891 Unspecified atrial fibrillation: Secondary | ICD-10-CM | POA: Diagnosis not present

## 2020-01-05 DIAGNOSIS — I5033 Acute on chronic diastolic (congestive) heart failure: Secondary | ICD-10-CM | POA: Diagnosis not present

## 2020-01-05 DIAGNOSIS — I4819 Other persistent atrial fibrillation: Secondary | ICD-10-CM | POA: Diagnosis present

## 2020-01-05 DIAGNOSIS — I4821 Permanent atrial fibrillation: Secondary | ICD-10-CM

## 2020-01-05 DIAGNOSIS — I5032 Chronic diastolic (congestive) heart failure: Secondary | ICD-10-CM | POA: Diagnosis present

## 2020-01-05 DIAGNOSIS — Z9181 History of falling: Secondary | ICD-10-CM | POA: Diagnosis not present

## 2020-01-05 DIAGNOSIS — I110 Hypertensive heart disease with heart failure: Secondary | ICD-10-CM | POA: Diagnosis present

## 2020-01-05 DIAGNOSIS — W19XXXA Unspecified fall, initial encounter: Secondary | ICD-10-CM | POA: Diagnosis present

## 2020-01-05 DIAGNOSIS — E781 Pure hyperglyceridemia: Secondary | ICD-10-CM | POA: Diagnosis present

## 2020-01-05 DIAGNOSIS — M6282 Rhabdomyolysis: Secondary | ICD-10-CM | POA: Diagnosis present

## 2020-01-05 DIAGNOSIS — I081 Rheumatic disorders of both mitral and tricuspid valves: Secondary | ICD-10-CM | POA: Diagnosis present

## 2020-01-05 DIAGNOSIS — I472 Ventricular tachycardia: Secondary | ICD-10-CM | POA: Diagnosis not present

## 2020-01-05 DIAGNOSIS — E785 Hyperlipidemia, unspecified: Secondary | ICD-10-CM | POA: Diagnosis present

## 2020-01-05 DIAGNOSIS — I2510 Atherosclerotic heart disease of native coronary artery without angina pectoris: Secondary | ICD-10-CM | POA: Diagnosis present

## 2020-01-05 DIAGNOSIS — E1140 Type 2 diabetes mellitus with diabetic neuropathy, unspecified: Secondary | ICD-10-CM | POA: Diagnosis present

## 2020-01-05 DIAGNOSIS — I272 Pulmonary hypertension, unspecified: Secondary | ICD-10-CM | POA: Diagnosis present

## 2020-01-05 DIAGNOSIS — R4182 Altered mental status, unspecified: Secondary | ICD-10-CM | POA: Diagnosis present

## 2020-01-05 DIAGNOSIS — Z951 Presence of aortocoronary bypass graft: Secondary | ICD-10-CM | POA: Diagnosis not present

## 2020-01-05 DIAGNOSIS — R4189 Other symptoms and signs involving cognitive functions and awareness: Secondary | ICD-10-CM | POA: Diagnosis not present

## 2020-01-05 DIAGNOSIS — R2681 Unsteadiness on feet: Secondary | ICD-10-CM | POA: Diagnosis present

## 2020-01-05 DIAGNOSIS — I361 Nonrheumatic tricuspid (valve) insufficiency: Secondary | ICD-10-CM | POA: Diagnosis not present

## 2020-01-05 DIAGNOSIS — G4733 Obstructive sleep apnea (adult) (pediatric): Secondary | ICD-10-CM | POA: Diagnosis present

## 2020-01-05 DIAGNOSIS — F0390 Unspecified dementia without behavioral disturbance: Secondary | ICD-10-CM | POA: Diagnosis present

## 2020-01-05 DIAGNOSIS — R410 Disorientation, unspecified: Secondary | ICD-10-CM | POA: Diagnosis not present

## 2020-01-05 DIAGNOSIS — Z952 Presence of prosthetic heart valve: Secondary | ICD-10-CM | POA: Diagnosis not present

## 2020-01-05 DIAGNOSIS — Z955 Presence of coronary angioplasty implant and graft: Secondary | ICD-10-CM | POA: Diagnosis not present

## 2020-01-05 DIAGNOSIS — Z20822 Contact with and (suspected) exposure to covid-19: Secondary | ICD-10-CM | POA: Diagnosis present

## 2020-01-05 LAB — BASIC METABOLIC PANEL
Anion gap: 8 (ref 5–15)
Anion gap: 8 (ref 5–15)
BUN: 10 mg/dL (ref 8–23)
BUN: 11 mg/dL (ref 8–23)
CO2: 27 mmol/L (ref 22–32)
CO2: 26 mmol/L (ref 22–32)
Calcium: 10.1 mg/dL (ref 8.9–10.3)
Calcium: 10.4 mg/dL — ABNORMAL HIGH (ref 8.9–10.3)
Chloride: 102 mmol/L (ref 98–111)
Chloride: 102 mmol/L (ref 98–111)
Creatinine, Ser: 1.1 mg/dL (ref 0.61–1.24)
Creatinine, Ser: 1.06 mg/dL (ref 0.61–1.24)
GFR calc Af Amer: >60 mL/min (ref >60)
GFR calc Af Amer: >60 mL/min (ref >60)
GFR calc non Af Amer: >60 mL/min (ref >60)
GFR calc non Af Amer: >60 mL/min (ref >60)
Glucose, Bld: 131 mg/dL — ABNORMAL HIGH (ref 70–99)
Glucose, Bld: 143 mg/dL — ABNORMAL HIGH (ref 70–99)
Potassium: 4.3 mmol/L (ref 3.5–5.1)
Potassium: 4.1 mmol/L (ref 3.5–5.1)
Sodium: 137 mmol/L (ref 135–145)
Sodium: 136 mmol/L (ref 135–145)

## 2020-01-05 LAB — GLUCOSE, CAPILLARY
Glucose-Capillary: 128 mg/dL — ABNORMAL HIGH (ref 70–99)
Glucose-Capillary: 136 mg/dL — ABNORMAL HIGH (ref 70–99)
Glucose-Capillary: 150 mg/dL — ABNORMAL HIGH (ref 70–99)
Glucose-Capillary: 141 mg/dL — ABNORMAL HIGH (ref 70–99)

## 2020-01-05 LAB — CBC
HCT: 36.8 % — ABNORMAL LOW (ref 39.0–52.0)
Hemoglobin: 12.1 g/dL — ABNORMAL LOW (ref 13.0–17.0)
MCH: 30.7 pg (ref 26.0–34.0)
MCHC: 32.9 g/dL (ref 30.0–36.0)
MCV: 93.4 fL (ref 80.0–100.0)
Platelets: 143 10*3/uL — ABNORMAL LOW (ref 150–400)
RBC: 3.94 MIL/uL — ABNORMAL LOW (ref 4.22–5.81)
RDW: 13.4 % (ref 11.5–15.5)
WBC: 6.1 10*3/uL (ref 4.0–10.5)
nRBC: 0 % (ref 0.0–0.2)

## 2020-01-05 LAB — HEPATIC FUNCTION PANEL
ALT: 19 U/L (ref 0–44)
AST: 32 U/L (ref 15–41)
Albumin: 3.2 g/dL — ABNORMAL LOW (ref 3.5–5.0)
Alkaline Phosphatase: 60 U/L (ref 38–126)
Bilirubin, Direct: 0.3 mg/dL — ABNORMAL HIGH (ref 0.0–0.2)
Indirect Bilirubin: 1.1 mg/dL — ABNORMAL HIGH (ref 0.3–0.9)
Total Bilirubin: 1.4 mg/dL — ABNORMAL HIGH (ref 0.3–1.2)
Total Protein: 5.9 g/dL — ABNORMAL LOW (ref 6.5–8.1)

## 2020-01-05 LAB — PROTIME-INR
INR: 2.3 — ABNORMAL HIGH (ref 0.8–1.2)
Prothrombin Time: 24.9 seconds — ABNORMAL HIGH (ref 11.4–15.2)

## 2020-01-05 MED ORDER — WARFARIN SODIUM 5 MG PO TABS
5.0000 mg | ORAL_TABLET | Freq: Once | ORAL | Status: AC
  Administered 2020-01-05: 5 mg via ORAL
  Filled 2020-01-05: qty 1

## 2020-01-05 NOTE — Progress Notes (Signed)
FPTS Interim Progress Note  Spoke to patients daughter (both [name] and YEMVVK) to provide updates. [name] is going to try to come see patient tonight, but if unable to she will be by both Saturday and Sunday. Answered all questions appropriately.   Mina Marble [place], DO 01/05/2020, 3:13 PM PGY-3, [place] Medicine Service pager [phone]

## 2020-01-05 NOTE — Progress Notes (Addendum)
Family Medicine Teaching Service Daily Progress Note Intern Pager: [phone]  Patient name: Matthew Edwards Medical record number: [id number] Date of birth: 14-Jan-1941 Age: 79 y.o. Gender: male  Primary Care Provider: Lind Covert, MD Consultants: None Code Status: FULL  Pt Overview and Major Events to Date:  Admitted 8/4  Assessment and Plan: Matthew Edwards a 79 y.o.malepresenting s/p fall. PMH is significant forHTN, T2DM with neuropathy, primary hyperparathyroidism, HLD, hypercalcemia, alcohol abuse, aortic valve replacement, atrial fibrillationon Coumadin, OSA, COPD.  Possible Dementia Fall with elevated CK and left shoulder pain Unsteady Gait  Daughter was able to visit with patient yesterday evening. He appeared more alert and 1:1 sitter was discontinued. SW working to find SNF placement for patient. CK trending down 934>347. Today patient continues to be disoriented. He asks if he is in a "store house", has a difficult time remembering if his daughter did visit him last night. He is consistently requesting to go home and get his clothes.  - Left shoulder x-ray: negative for fracture or dislocation - Will discuss with daughter today to see if they are able to visit and stay with patient again, help him with orientation  - PT recommends SNF placement, SW working to find placement for patient.  - Follow upOT eval - Vital signs per floor protocol - Cardiac monitoring  - Continue IV hydration - Orthostatic vital signs - Daily CBC/BMP  Atrial Fibrillation with RVR: stable, improved Diltiazem drip d/c. HR recorded 88-108 overnight. In room, monitor ranging from 110's. Patient asymptomatic at this time.  - Continuous cardiac monitoring - Metoprolol 25 mg BID  HTN, chronic, stable BP 123/95 this AM. Home medications notable for benazepril 20 mg daily, lasix 40mg  BID. -Continue to HOLD home benazapril and lasix due to low blood pressures, will continue to  monitor and can add back on as needed - Strict I/O - Monitor daily - Metoprolol 25mg  BID   Hx of Type 2 Diabetes Mellitus: chronic, stable Discussed with daughter yesterday who states patient was recently taken off Glipizide due to concern for hypoglycemia. CBG 130's overnight. - sensitive SSI, has not required thus far - Carb modified diet  - CBG's daily to monitor   Hx of Aortic Valve Repalcement  CAD: chronic, stable. Patient with history of cardiac catheterization 06/2010, coronary artery bypass graft and aortic valve replacement 07/28/2010, coronary angioplasty with stent placement 10/01/2010. On atorvastatin 40mg  qd, nitroglycerin PRN, metoprolol succinate 25mg  daily, benazepril 20mg  daily, warfarin 10mg  Thursdays and 7.5mg  all other days. AM INR 2.3 - Continue homeAtorvastatin  - On metoprolol tartrate 25 BID currently - Hold home Benazepril d/t low blood pressures, restart as needed - Daily INR, Warfarin dosed by pharmacy  Hx HLD: chronic, stable, improved Last lipid panel in 2017- Triglycerides elevated to 181, VLDL 36. Home medication notable for atorvastatin 40mg . Repeat lipid panel WNL. - Continue home Atorvastatin 40mg   FEN/GI: Carb modified diet PPx: Warfarin, dosed by pharmacy  Disposition: MEDICALLY STABLE for discharge to SNF  Subjective:  Patient today wondering where he is. He asks if he is in a store house. He would like to go home and get his clothes. He is unsure if his daughter Matthew Edwards visited him yesterday but believes his daughter Matthew Edwards was able to visit last night briefly. He has no complaints at this time. No chest pain, SOB, abdominal pain.  Objective: Temp:  [97.6 F (36.4 C)-99.1 F (37.3 C)] 99.1 F (37.3 C) (08/06 0349) Pulse Rate:  [79-111] 89 (08/06 0349)  Resp:  [16-27] 24 (08/06 0349) BP: (105-113)/(64-83) 107/76 (08/06 0349) SpO2:  [92 %-96 %] 94 % (08/06 0349) Weight:  [94.3 kg] 94.3 kg (08/05 1552) Physical Exam: General: Awake, not  oriented to place, time. No acute distress Cardiovascular: Irregularly irregular, rate 110's on telemetry Respiratory: CTAB b/l Abdomen: soft, non-distended, non-tender Extremities: no edema   Laboratory: Recent Labs  Lab 01/02/20 1508 01/04/20 0642 01/05/20 0431  WBC 8.8 6.0 6.1  HGB 14.8 12.1* 12.1*  HCT 46.8 36.8* 36.8*  PLT 149* 134* 143*   Recent Labs  Lab 01/02/20 1508 01/03/20 0210 01/04/20 0642 01/05/20 0431  NA 137  --  136 137  K 4.1  --  4.2 4.3  CL 100  --  99 102  CO2 26  --  28 27  BUN 17  --  15 10  CREATININE 1.29*  --  1.11 1.10  CALCIUM 10.6*  --  10.0 10.1  PROT  --  7.7  --  5.9*  BILITOT  --  2.3*  --  1.4*  ALKPHOS  --  81  --  60  ALT  --  23  --  19  AST  --  55*  --  32  GLUCOSE 160*  --  122* 131*    Imaging/Diagnostic Tests: DG Shoulder Left 8/5 IMPRESSION: No acute osseous abnormality.  Sharion Settler, DO 01/05/2020, 5:57 AM PGY-1, [place] Intern pager: [phone], text pages welcome

## 2020-01-05 NOTE — Progress Notes (Signed)
ANTICOAGULATION CONSULT NOTE - Initial Consult  Pharmacy Consult for warfarin Indication: atrial fibrillation  Allergies  Allergen Reactions   Pineapple Concentrate Nausea And Vomiting    Patient Measurements: Height: 175.3 cm, 5'9"  Vital Signs: Temp: 97.8 F (36.6 C) (08/06 1201) Temp Source: Oral (08/06 1201) BP: 105/70 (08/06 1201) Pulse Rate: 112 (08/06 1201)  Labs: Recent Labs    01/02/20 1508 01/02/20 1508 01/03/20 0210 01/03/20 0218 01/04/20 0642 01/04/20 1125 01/05/20 0431  HGB 14.8   < >  --   --  12.1*  --  12.1*  HCT 46.8  --   --   --  36.8*  --  36.8*  PLT 149*  --   --   --  134*  --  143*  APTT  --   --   --  52*  --   --   --   LABPROT  --   --   --  26.1* 27.7*  --  24.9*  INR  --   --   --  2.5* 2.7*  --  2.3*  CREATININE 1.29*  --   --   --  1.11  --  1.10  CKTOTAL  --   --  934*  --   --  347  --    < > = values in this interval not displayed.    Estimated Creatinine Clearance: 64.7 mL/min (by C-G formula based on SCr of 1.1 mg/dL).   Medical History: Past Medical History:  Diagnosis Date   Arthritis    Atrial fibrillation ([place])    CAD (coronary artery disease) 2012   [place]-LAD 2 or 3 srents   Complication of anesthesia    "loopy after polpy removal dec 2017, lasted several days"   COPD (chronic obstructive pulmonary disease) (HCC)    Dementia (HCC)    GERD (gastroesophageal reflux disease)    HEART FAILURE, CONGESTIVE UNSPEC 02/23/2007   Qualifier: Diagnosis of  By: Mellody Drown MD, [facility]     Hyperlipidemia    Hypertension    OSA (obstructive sleep apnea) 11/13/2014   S/P AVR    #25 mm Edwards pericardial valve Bioprosthetic. Dr Cyndia Bent.   Situational depression    "son passed 11/2013"   Sleep apnea    occ uses c pap does not know settings    Stented coronary artery 2013   TOBACCO DEPENDENCE 07/29/2006   Qualifier: History of  By: Carlena Sax  MD, Stephanie     Type II diabetes mellitus ([place])     Medications:   Scheduled:   atorvastatin  40 mg Oral q1800   fluticasone furoate-vilanterol  1 puff Inhalation Daily   insulin aspart  0-9 Units Subcutaneous TID WC   metoprolol tartrate  25 mg Oral BID   potassium chloride SA  20 mEq Oral QHS   Warfarin - Pharmacist Dosing Inpatient   Does not apply q1600    Assessment: Patient is a 79 yo male presenting after an at-home fall with altered mental status. Patient has a history of atrial fibrillation treated with warfarin. Pharmacy has been consulted to dose warfarin.  The patient's prior warfarin regimen is unknown and variable and could not be confirmed by patient or daughter per physician conversation. Patient indicated he has not been eating much at home but oral intake in hospital is stable. The patient's CBC is currently WNL and their INR on 01/05/20 is 2.3. No bleeding is noted or documented. Plan is to discharge to SNF.  Goal of Therapy:  INR 2-3 Monitor platelets by anticoagulation protocol: Yes   Plan:  Give warfarin PO 5mg  tonight Obtain daily PT/INR and monitor CBC Monitor for signs and symptoms of bleeding Monitor diet and oral intake  Cephus Slater, PharmD, [facility] Resident [phone] 01/05/2020 2:57 PM  Please check [email or web address] for unit-specific pharmacy phone numbers.

## 2020-01-05 NOTE — Progress Notes (Signed)
Physical Therapy Treatment Patient Details Name: Matthew Edwards MRN: [id number] DOB: 29-Oct-1940 Today's Date: 01/05/2020    History of Present Illness Patient is a 79 y/o male with history of atrial fibrillation on coumadin, HLD HTN, GERD, CHF, COPD, CAD s/p CABG/STENTS, OSA, T2DM who lives alone, presents after being found on the floor of his home by his daughter last evening (01/01/20). It is unknown how long he was on the floor. Daughter reports she thinks he fell in the kitchen and crawled into the living room where she found him. No wound or bleeding. The patient cannot provide details of his fall. He denies current pain, SOB, nausea, headache or visual change.     PT Comments    Pt agreeable to participating.  Pt having significant difficulty standing from a non elevated surface.  Emphasis on sit to stand trial, exercises for quads and glutes.  Exercises for gross extension in supine when pt became tired.     Follow Up Recommendations  SNF;Supervision/Assistance - 24 hour     Equipment Recommendations  None recommended by PT    Recommendations for Other Services       Precautions / Restrictions Precautions Precautions: Fall    Mobility  Bed Mobility Overal bed mobility: Modified Independent                Transfers Overall transfer level: Needs assistance   Transfers: Sit to/from Stand Sit to Stand: Mod assist         General transfer comment: cues for best technique, worked on coming forward and boosting x6.  Ambulation/Gait Ambulation/Gait assistance: Min guard Gait Distance (Feet): 150 Feet Assistive device: Rolling walker (2 wheeled) Gait Pattern/deviations: Step-through pattern Gait velocity: moderate Gait velocity interpretation: 1.31 - 2.62 ft/sec, indicative of limited community ambulator General Gait Details: Mildly unsteady.  cues for proximity to RW and to not leave the RW behind when going to the chair.   Stairs             Wheelchair  Mobility    Modified Rankin (Stroke Patients Only)       Balance Overall balance assessment: Needs assistance Sitting-balance support: No upper extremity supported;Bilateral upper extremity supported Sitting balance-Leahy Scale: Good       Standing balance-Leahy Scale: Fair Standing balance comment: statcally can maintain stance without holding                            Cognition Arousal/Alertness: Awake/alert Behavior During Therapy: WFL for tasks assessed/performed Overall Cognitive Status: History of cognitive impairments - at baseline                   Orientation Level: Situation;Place       Safety/Judgement: Decreased awareness of safety;Decreased awareness of deficits Awareness: Intellectual Problem Solving: Slow processing;Difficulty sequencing        Exercises Other Exercises Other Exercises: bridges x10 with min tactile cues Other Exercises: hip/knee resisted extension.    General Comments General comments (skin integrity, edema, etc.): vss      Pertinent Vitals/Pain Pain Assessment: No/denies pain    Home Living                      Prior Function            PT Goals (current goals can now be found in the care plan section) Acute Rehab PT Goals Patient Stated Goal: Home, but pt agreed that rehab  might make him stronger and safer PT Goal Formulation: With patient Time For Goal Achievement: 01/10/20 Potential to Achieve Goals: Good Progress towards PT goals: Progressing toward goals    Frequency    Min 3X/week      PT Plan Current plan remains appropriate    Co-evaluation              AM-PAC PT "6 Clicks" Mobility   Outcome Measure  Help needed turning from your back to your side while in a flat bed without using bedrails?: None Help needed moving from lying on your back to sitting on the side of a flat bed without using bedrails?: None Help needed moving to and from a bed to a chair (including a  wheelchair)?: A Lot Help needed standing up from a chair using your arms (e.g., wheelchair or bedside chair)?: A Lot Help needed to walk in hospital room?: A Little Help needed climbing 3-5 steps with a railing? : A Lot 6 Click Score: 17    End of Session   Activity Tolerance: Patient tolerated treatment well Patient left: in chair;with call bell/phone within reach;with chair alarm set Nurse Communication: Mobility status PT Visit Diagnosis: Unsteadiness on feet (R26.81);Other abnormalities of gait and mobility (R26.89);History of falling (Z91.81);Muscle weakness (generalized) (M62.81)     Time: 2993-7169 PT Time Calculation (min) (ACUTE ONLY): 28 min  Charges:  $Gait Training: 8-22 mins $Therapeutic Activity: 8-22 mins                     01/05/2020  Ginger Carne., PT Acute Rehabilitation Services [phone]  (pager) [phone]  (office)   Tessie Fass Brentney Goldbach 01/05/2020, 5:43 PM

## 2020-01-05 NOTE — Progress Notes (Signed)
Mobility Specialist: Progress Note    01/05/20 1715  Mobility  Activity Ambulated to bathroom  Level of Assistance Minimal assist, patient does 75% or more  Assistive Device Front wheel walker  Distance Ambulated (ft) 40 ft  Mobility Response Tolerated well  Mobility performed by Mobility specialist  Bed Position Semi-fowlers  $Mobility charge 1 Mobility   Psychologist, clinical

## 2020-01-05 NOTE — TOC Progression Note (Addendum)
Transition of Care [facility]) - Progression Note    Patient Details  Name: WILIAN KWONG MRN: [id number] Date of Birth: 1940/12/20  Transition of Care [facility]) CM/SW [place], [place] Phone Number: 01/05/2020, 3:20 PM  Clinical Narrative:     CSW informed patient is medically ready for discharge- Patient needs PT/OT eval before CSW can submit for insurance authorization for SNF. Will need insurance auth before discharged to SNF. Patient will need covid test before discharge to SNF.  CSW spoke with patient's daughter,[name], informed only [facility] only bed offer at this time. [facility] -declined - CSW reached out to [employer] and waiting on response. Patient's daughter expressed she really wants the patient to go to [facility]- requested to re-send clinicals after PT/OT eval and updated MD notes.  TOC will continue to follow and assist with discharge planning.   Thurmond Butts, MSW, [place] Clinical Social Worker    Expected Discharge Plan: Skilled Nursing Facility Barriers to Discharge: Continued Medical Work up, Ship broker, SNF Pending bed offer  Expected Discharge Plan and Services Expected Discharge Plan: [place] In-house Referral: Clinical Social Work     Living arrangements for the past 2 months: Single Family Home                                       Social Determinants of Health (SDOH) Interventions    Readmission Risk Interventions No flowsheet data found.

## 2020-01-06 LAB — HIV ANTIBODY (ROUTINE TESTING W REFLEX): HIV Screen 4th Generation wRfx: NONREACTIVE

## 2020-01-06 LAB — GLUCOSE, CAPILLARY
Glucose-Capillary: 124 mg/dL — ABNORMAL HIGH (ref 70–99)
Glucose-Capillary: 117 mg/dL — ABNORMAL HIGH (ref 70–99)
Glucose-Capillary: 86 mg/dL (ref 70–99)
Glucose-Capillary: 134 mg/dL — ABNORMAL HIGH (ref 70–99)

## 2020-01-06 LAB — BASIC METABOLIC PANEL
Anion gap: 9 (ref 5–15)
BUN: 12 mg/dL (ref 8–23)
CO2: 23 mmol/L (ref 22–32)
Calcium: 10.4 mg/dL — ABNORMAL HIGH (ref 8.9–10.3)
Chloride: 103 mmol/L (ref 98–111)
Creatinine, Ser: 0.93 mg/dL (ref 0.61–1.24)
GFR calc Af Amer: >60 mL/min (ref >60)
GFR calc non Af Amer: >60 mL/min (ref >60)
Glucose, Bld: 118 mg/dL — ABNORMAL HIGH (ref 70–99)
Potassium: 4.3 mmol/L (ref 3.5–5.1)
Sodium: 135 mmol/L (ref 135–145)

## 2020-01-06 LAB — PROTIME-INR
INR: 2.2 — ABNORMAL HIGH (ref 0.8–1.2)
Prothrombin Time: 23.7 seconds — ABNORMAL HIGH (ref 11.4–15.2)

## 2020-01-06 LAB — MAGNESIUM: Magnesium: 1.8 mg/dL (ref 1.7–2.4)

## 2020-01-06 LAB — PHOSPHORUS: Phosphorus: 2.6 mg/dL (ref 2.5–4.6)

## 2020-01-06 MED ORDER — POLYETHYLENE GLYCOL 3350 17 G PO PACK
17.0000 g | PACK | Freq: Every day | ORAL | Status: DC
  Administered 2020-01-06 – 2020-01-11 (×5): 17 g via ORAL
  Filled 2020-01-06 (×5): qty 1

## 2020-01-06 MED ORDER — ACETAMINOPHEN 325 MG PO TABS
650.0000 mg | ORAL_TABLET | Freq: Four times a day (QID) | ORAL | Status: DC | PRN
  Administered 2020-01-06 – 2020-01-10 (×3): 650 mg via ORAL
  Filled 2020-01-06 (×4): qty 2

## 2020-01-06 MED ORDER — METOPROLOL TARTRATE 25 MG PO TABS
37.5000 mg | ORAL_TABLET | Freq: Two times a day (BID) | ORAL | Status: DC
  Administered 2020-01-06 – 2020-01-11 (×11): 37.5 mg via ORAL
  Filled 2020-01-06 (×11): qty 1

## 2020-01-06 MED ORDER — WARFARIN SODIUM 5 MG PO TABS
5.0000 mg | ORAL_TABLET | Freq: Once | ORAL | Status: AC
  Administered 2020-01-06: 5 mg via ORAL
  Filled 2020-01-06: qty 1

## 2020-01-06 MED ORDER — QUETIAPINE FUMARATE 25 MG PO TABS
50.0000 mg | ORAL_TABLET | Freq: Two times a day (BID) | ORAL | Status: DC | PRN
  Administered 2020-01-06: 50 mg via ORAL
  Filled 2020-01-06: qty 2

## 2020-01-06 NOTE — Plan of Care (Signed)
  Problem: Health Behavior/Discharge Planning: Goal: Ability to manage health-related needs will improve Outcome: Progressing   

## 2020-01-06 NOTE — TOC Progression Note (Signed)
Transition of Care [facility]) - Progression Note    Patient Details  Name: Matthew Edwards MRN: [id number] Date of Birth: 11/27/1940  Transition of Care [facility]) CM/SW [place], [place] Phone Number: [phone] 01/06/2020, 3:11 PM  Clinical Narrative:     CSW spoke with April at [facility] and was alerted that since referral was closed out on their end a new referral would need to be sent for her to see the updated clinical information CSW resent referral.  CSW attempted to start insurance authorization however his insurance is not managed by [facility]. CSW was provided a regular [employer] number however they were closed.  TOC team will continue to assist with discharge planning needs.  Expected Discharge Plan: [place] Barriers to Discharge: Continued Medical Work up, Ship broker, SNF Pending bed offer  Expected Discharge Plan and Services Expected Discharge Plan: [place] In-house Referral: Clinical Social Work     Living arrangements for the past 2 months: Single Family Home                                       Social Determinants of Health (SDOH) Interventions    Readmission Risk Interventions No flowsheet data found.

## 2020-01-06 NOTE — Evaluation (Signed)
Occupational Therapy Evaluation Patient Details Name: Matthew Edwards MRN: [id number] DOB: 1941/04/20 Today's Date: 01/06/2020    History of Present Illness Patient is a 79 y/o male with history of atrial fibrillation on coumadin, HLD HTN, GERD, CHF, COPD, CAD s/p CABG/STENTS, OSA, T2DM who lives alone, presents after being found on the floor of his home by his daughter last evening (01/01/20). It is unknown how long he was on the floor. Daughter reports she thinks he fell in the kitchen and crawled into the living room where she found him. No wound or bleeding. The patient cannot provide details of his fall. He denies current pain, SOB, nausea, headache or visual change.    Clinical Impression   Pt admitted with fall. Pt currently with functional limitations due to the deficits listed below (see OT Problem List). Pt was was able to report date, time and location. Pt also was able to report on how they had a fall and came to the hospital. Pt completed sit to stand transfer with max assist (lower chair with arm rests). Pt completed ambulation with RW and min guard and cues for safety. Pt required moderate cues for safety and min assist to moderate assist as completion of toilet task with grab bars, 3 in 1 commode and walker. Pt HR 106-108 with activity in session. Pt will benefit from skilled OT to increase their safety and independence with ADL and functional mobility for ADL to facilitate discharge to venue listed below.       Follow Up Recommendations  Supervision/Assistance - 24 hour;SNF    Equipment Recommendations       Recommendations for Other Services       Precautions / Restrictions Precautions Precautions: Fall Restrictions Weight Bearing Restrictions: No      Mobility Bed Mobility Overal bed mobility:  (presented sitting in chair)                Transfers Overall transfer level: Needs assistance Equipment used: Rolling walker (2 wheeled) Transfers: Sit to/from  Stand Sit to Stand: Max assist              Balance Overall balance assessment: Needs assistance Sitting-balance support: No upper extremity supported;Bilateral upper extremity supported Sitting balance-Leahy Scale: Good     Standing balance support: Bilateral upper extremity supported;No upper extremity supported Standing balance-Leahy Scale: Fair Standing balance comment: statcally can maintain stance without holding for less then 1 min                           ADL either performed or assessed with clinical judgement   ADL Overall ADL's : Needs assistance/impaired Eating/Feeding: Independent;Sitting   Grooming: Wash/dry hands;Wash/dry face;Sitting;Set up   Upper Body Bathing: Minimal assistance;Cueing for safety;Cueing for sequencing;Sitting   Lower Body Bathing: Moderate assistance;Cueing for safety;Cueing for sequencing;Sit to/from stand   Upper Body Dressing : Set up;Sitting;Cueing for safety;Cueing for sequencing   Lower Body Dressing: Moderate assistance;Cueing for safety;Cueing for sequencing;Sit to/from stand   Toilet Transfer: Moderate assistance;BSC;RW;Grab bars   Toileting- Clothing Manipulation and Hygiene: Maximal assistance;Cueing for safety;Cueing for sequencing;Sit to/from stand       Functional mobility during ADLs: Minimal assistance;Rolling walker;Cueing for safety       Vision Baseline Vision/History: Wears glasses Wears Glasses: At all times Patient Visual Report: No change from baseline Vision Assessment?: No apparent visual deficits     Perception Perception Perception Tested?: No   Praxis Praxis Praxis tested?: Not tested  Pertinent Vitals/Pain Pain Assessment: Faces Faces Pain Scale: Hurts a little bit Pain Location: L shoulder Pain Descriptors / Indicators: Aching Pain Intervention(s): Limited activity within patient's tolerance     Hand Dominance Right   Extremity/Trunk Assessment Upper Extremity  Assessment Upper Extremity Assessment: RUE deficits/detail;LUE deficits/detail RUE Deficits / Details: pt reports pain in LUE and about 50% ROM in BUE LUE: Unable to fully assess due to pain   Lower Extremity Assessment Lower Extremity Assessment: Overall WFL for tasks assessed   Cervical / Trunk Assessment Cervical / Trunk Assessment: Kyphotic   Communication Communication Communication: HOH (does better out of L ear)   Cognition Arousal/Alertness: Awake/alert Behavior During Therapy: WFL for tasks assessed/performed Overall Cognitive Status: History of cognitive impairments - at baseline Area of Impairment: Safety/judgement                         Safety/Judgement: Decreased awareness of safety;Decreased awareness of deficits         General Comments       Exercises     Shoulder Instructions      Home Living Family/patient expects to be discharged to:: Private residence Living Arrangements: Alone Available Help at Discharge: Family;Available PRN/intermittently Type of Home: Apartment Home Access: Stairs to enter Entrance Stairs-Number of Steps: 2 Entrance Stairs-Rails: Right;Left Home Layout: One level     Bathroom Shower/Tub: Teacher, early years/pre: Standard Bathroom Accessibility: Yes How Accessible: Accessible via walker Home Equipment: [place] - 2 wheels;Cane - single point          Prior Functioning/Environment Level of Independence: Independent with assistive device(s)                 OT Problem List: Decreased strength;Decreased range of motion;Decreased activity tolerance;Decreased safety awareness;Decreased knowledge of use of DME or AE;Pain      OT Treatment/Interventions: Self-care/ADL training;Therapeutic exercise;Energy conservation;DME and/or AE instruction;Therapeutic activities;Patient/family education;Balance training    OT Goals(Current goals can be found in the care plan section) Acute Rehab OT Goals Patient  Stated Goal: to move more OT Goal Formulation: With patient Time For Goal Achievement: 01/27/20 Potential to Achieve Goals: Good ADL Goals Pt Will Perform Upper Body Dressing: with modified independence;sitting Pt Will Perform Lower Body Dressing: with set-up;sit to/from stand Pt Will Transfer to Toilet: with supervision;ambulating;regular height toilet;grab bars  OT Frequency: Min 2X/week   Barriers to D/C: Decreased caregiver support          Co-evaluation              AM-PAC OT "6 Clicks" Daily Activity     Outcome Measure Help from another person eating meals?: None Help from another person taking care of personal grooming?: A Little Help from another person toileting, which includes using toliet, bedpan, or urinal?: A Little Help from another person bathing (including washing, rinsing, drying)?: A Lot Help from another person to put on and taking off regular upper body clothing?: A Little Help from another person to put on and taking off regular lower body clothing?: A Lot 6 Click Score: 17   End of Session Equipment Utilized During Treatment: Gait belt;Rolling walker Nurse Communication: Mobility status  Activity Tolerance: Patient tolerated treatment well Patient left: in chair;with call bell/phone within reach;with chair alarm set  OT Visit Diagnosis: Unsteadiness on feet (R26.81);Repeated falls (R29.6);Muscle weakness (generalized) (M62.81);Pain Pain - Right/Left: Left Pain - part of body: Shoulder  Time: 1028-9022 OT Time Calculation (min): 27 min Charges:  OT General Charges $OT Visit: 1 Visit OT Evaluation $OT Eval Low Complexity: 1 Low OT Treatments $Self Care/Home Management : 8-22 mins  Joeseph Amor OTR/L  Acute Rehab Services  [phone] office number [phone] pager number   Joeseph Amor 01/06/2020, 2:29 PM

## 2020-01-06 NOTE — Progress Notes (Signed)
Interim progress note   Blood pressure reportedly dropped to 78/58.  Went to check on patient and nurse believes he had to use the restroom and the blood pressure cuff got disconnected.  Patient feels fine, denies dizziness or lightheadedness.  Repeated blood pressure in the room and it was 133/102

## 2020-01-06 NOTE — TOC Progression Note (Addendum)
Transition of Care [facility]) - Progression Note    Patient Details  Name: KHORI ROSEVEAR MRN: [id number] Date of Birth: 21-Jul-1940  Transition of Care [facility]) CM/SW [place], [place] Phone Number: [phone] 01/06/2020, 2:30 PM  Clinical Narrative:     CSW attempted to follow up with [facility] in regards to the clinical however had to leave a message.  CSW spoke with Kirstin at [facility] and she stated that the earliest they could admit someone would be Wednesday.  CSW spoke with daughter [name] and updated her about discharge options. CSW explained again that patient has one bed offer and that it appeared that patient was medically ready for discharge. Dehsy stated that she would discuss it with siblings and call CSW back.   TOC team will continue to assist with discharge planning needs.   Expected Discharge Plan: [place] Barriers to Discharge: Continued Medical Work up, Ship broker, SNF Pending bed offer  Expected Discharge Plan and Services Expected Discharge Plan: [place] In-house Referral: Clinical Social Work     Living arrangements for the past 2 months: Single Family Home                                       Social Determinants of Health (SDOH) Interventions    Readmission Risk Interventions No flowsheet data found.

## 2020-01-06 NOTE — Progress Notes (Signed)
Family Medicine Teaching Service Daily Progress Note Intern Pager: [phone]  Matthew Edwards name: Matthew Edwards Medical record number: [id number] Date of birth: 12-Feb-1941 Age: 79 y.o. Gender: male  Primary Care Provider: Lind Covert, MD Consultants: None Code Status: Full  Pt Overview and Major Events to Date:  Admitted 8/4  Assessment and Plan: Matthew Edwards a 79 y.o.malepresenting s/p fall and found to be in A. fib with RVR. PMH is significant forHTN, T2DM with neuropathy, primary hyperparathyroidism, HLD, hypercalcemia, alcohol abuse, aortic valve replacement, atrial fibrillationon Coumadin, OSA, COPD.  Atrial fibrillation with RVR: Stable, improving Matthew Edwards previously on dilt drip, now on metoprolol tartrate twice daily.  Heart rates overnight were in the 100-1 20 range.  Matthew Edwards asymptomatic.  Irregularly irregular on exam. -Increase metoprolol tartrate to 37.5 twice daily -Continuous cardiac monitoring  PossibleDementia Fallwithelevated CK and left shoulder pain Unsteady Gait Matthew Edwards alert and oriented to self and situation this morning.  Sitter was discontinued yesterday.  Matthew Edwards currently awaiting SNF placement and expect to be placed on Monday.  CK downtrending, did not continue to trend labs.  Left shoulder x-ray negative for fracture dislocation. -Matthew Edwards will need Covid test prior to discharge. -Follow-up OT eval and treat -Vital signs every 8 hours -Follow-up HIV antibody -Follow-up on labs ordered for Sunday morning  HTN, chronic, stable BP 118/80 this AM. Home medications notable for benazepril 20 mg daily, lasix 40mg  BID. -Continue to HOLDhome benazapril and lasix  - Strict I/O - Monitor daily - Metoprolol tartrate 37.5 mg BID   Type 2 Diabetes Mellitus: chronic, stable Matthew Edwards taken off glipizide at home due to hypoglycemia concerns. CBG 130's overnight.  Matthew Edwards received 1 unit of NovoLog yesterday. - sensitive SSI,  - Carb modified  diet  -Monitor CBG's   Hx of Aortic Valve Repalcement  CAD: chronic, stable. Matthew Edwards with history of cardiac catheterization 06/2010, coronary artery bypass graft and aortic valve replacement 07/28/2010, coronary angioplasty with stent placement 10/01/2010.  At home Matthew Edwards on atorvastatin 40mg  qd, nitroglycerin PRN, metoprolol succinate 25mg  daily, benazepril 20mg  daily, warfarin 10mg  Thursdays and 7.5mg  all other days. AM VQM0.3 - Continue homeAtorvastatin - On metoprolol tartrate 37.5 BID currently - Hold home Benazepril d/t low blood pressures, restart as needed - Daily INR, Warfarin dosed by pharmacy  Hx HLD: chronic, stable, improved Last lipid panel in 2017-Triglycerides elevated to 181, VLDL 36. Home medication notable for atorvastatin 40mg . Repeat lipid panel WNL. - Continue home Atorvastatin 40mg   COPD Matthew Edwards on Symbicort at home.  No wheezing appreciated on exam today.  Matthew Edwards not having a respiratory distress. -Breo Ellipta on formulary.  1 puff daily  FEN/GI: Carb modified diet PPx: Warfarin per pharmacy  Disposition: Medically stable for discharge to SNF  Subjective:  Matthew Edwards states he is feeling well today but is ready to get out of the hospital.  No chest pain, shortness of breath, headache.  He states he is tired of being here.  He is aware of the fact that he will be discharging to SNF sometime in the next few days.  He states he needs to go to the bathroom.  Objective: Temp:  [97.8 F (36.6 C)-98.9 F (37.2 C)] 98.1 F (36.7 C) (08/07 0748) Pulse Rate:  [86-129] 110 (08/07 0748) Resp:  [14-27] 18 (08/07 0748) BP: (104-123)/(70-95) 118/80 (08/07 0748) SpO2:  [95 %-100 %] 96 % (08/07 0748) Physical Exam: General: Alert.  Oriented to self and situation. Cardiovascular: Irregularly irregular rhythm, tachycardic rate.  No murmurs Respiratory: Lungs clear  to auscultation bilaterally Abdomen: Soft, nontender.  Normal bowel sounds  Laboratory: Recent Labs   Lab 01/02/20 1508 01/04/20 0642 01/05/20 0431  WBC 8.8 6.0 6.1  HGB 14.8 12.1* 12.1*  HCT 46.8 36.8* 36.8*  PLT 149* 134* 143*   Recent Labs  Lab 01/02/20 1508 01/03/20 0210 01/04/20 0642 01/05/20 0431 01/05/20 2139  NA   < >  --  136 137 136  K   < >  --  4.2 4.3 4.1  CL   < >  --  99 102 102  CO2   < >  --  28 27 26   BUN   < >  --  15 10 11   CREATININE   < >  --  1.11 1.10 1.06  CALCIUM   < >  --  10.0 10.1 10.4*  PROT  --  7.7  --  5.9*  --   BILITOT  --  2.3*  --  1.4*  --   ALKPHOS  --  81  --  60  --   ALT  --  23  --  19  --   AST  --  55*  --  32  --   GLUCOSE   < >  --  122* 131* 143*   < > = values in this interval not displayed.      Imaging/Diagnostic Tests: DG left shoulder-There is no evidence of fracture or dislocation. No focal osseous lesion. Soft tissues are unremarkable. Post sternotomy sequela.  Benay Pike, MD 01/06/2020, 8:04 AM PGY-3, [place] Intern pager: [phone], text pages welcome

## 2020-01-06 NOTE — Progress Notes (Signed)
ANTICOAGULATION CONSULT NOTE - Initial Consult  Pharmacy Consult for warfarin Indication: atrial fibrillation  Allergies  Allergen Reactions  . Pineapple Concentrate Nausea And Vomiting    Patient Measurements: Height: 175.3 cm, 5'9"  Vital Signs: Temp: 97.3 F (36.3 C) (08/07 1100) Temp Source: Oral (08/07 1100) BP: 117/72 (08/07 1100) Pulse Rate: 66 (08/07 1100)  Labs: Recent Labs    01/04/20 0642 01/04/20 1125 01/05/20 0431 01/05/20 2139 01/06/20 1056  HGB 12.1*  --  12.1*  --   --   HCT 36.8*  --  36.8*  --   --   PLT 134*  --  143*  --   --   LABPROT 27.7*  --  24.9*  --  23.7*  INR 2.7*  --  2.3*  --  2.2*  CREATININE 1.11  --  1.10 1.06  --   CKTOTAL  --  347  --   --   --     Estimated Creatinine Clearance: 67.2 mL/min (by C-G formula based on SCr of 1.06 mg/dL).   Medical History: Past Medical History:  Diagnosis Date  . Arthritis   . Atrial fibrillation ([place])   . CAD (coronary artery disease) 2012   [place]-LAD 2 or 3 srents  . Complication of anesthesia    "loopy after polpy removal dec 2017, lasted several days"  . COPD (chronic obstructive pulmonary disease) ([place])   . Dementia ([place])   . GERD (gastroesophageal reflux disease)   . HEART FAILURE, CONGESTIVE UNSPEC 02/23/2007   Qualifier: Diagnosis of  By: Mellody Drown MD, [facility]    . Hyperlipidemia   . Hypertension   . OSA (obstructive sleep apnea) 11/13/2014  . S/P AVR    #25 mm Edwards pericardial valve Bioprosthetic. Dr Cyndia Bent.  . Situational depression    "son passed 11/2013"  . Sleep apnea    occ uses c pap does not know settings   . Stented coronary artery 2013  . TOBACCO DEPENDENCE 07/29/2006   Qualifier: History of  By: Carlena Sax  MD, [facility] [place]    . Type II diabetes mellitus (HCC)     Medications:  Scheduled:  . atorvastatin  40 mg Oral q1800  . fluticasone furoate-vilanterol  1 puff Inhalation Daily  . insulin aspart  0-9 Units Subcutaneous TID WC  . metoprolol tartrate  37.5 mg Oral BID   . polyethylene glycol  17 g Oral Daily  . potassium chloride SA  20 mEq Oral QHS  . Warfarin - Pharmacist Dosing Inpatient   Does not apply q1600    Assessment: Patient is a 79 yo male presenting after an at-home fall with altered mental status. Patient has a history of atrial fibrillation treated with warfarin. Pharmacy has been consulted to dose warfarin.  The patient's prior warfarin regimen is unknown and variable and could not be confirmed by patient or daughter per physician conversation. Patient indicated he has not been eating much at home but oral intake in hospital is stable.   The patient's CBC is currently stable with no signs of bleeding reported. Their INR today is therapeutic at 2.2.  Goal of Therapy:  INR 2-3 Monitor platelets by anticoagulation protocol: Yes   Plan:  Give warfarin PO 5mg  tonight Obtain daily PT/INR and monitor CBC Monitor for signs and symptoms of bleeding Monitor diet and oral intake  Norina Buzzard, PharmD PGY1 Pharmacy Resident 01/06/2020 11:26 AM  Please check [email or web address] for unit-specific pharmacy phone numbers.

## 2020-01-06 NOTE — Progress Notes (Signed)
FPTS Interim Progress Note  S: Received page from RN that patient had 20 beats of vtach at 2104. Patient received metoprolol tartrate 37.5 mg extra dose prior to my exam. Upon entering room, patient resting comfortably, asymptomatic, HR afib 104 bpm.   O: BP 112/79 (BP Location: Right Arm)   Pulse (!) 113   Temp 99.4 F (37.4 C) (Oral)   Resp 20   Wt 94.3 kg   SpO2 99%   BMI 30.70 kg/m   Physical Exam Vitals reviewed.  Constitutional:      General: He is not in acute distress.    Appearance: Normal appearance. He is obese. He is not ill-appearing or toxic-appearing.  Cardiovascular:     Rate and Rhythm: Tachycardia present. Rhythm irregular.  Pulmonary:     Effort: Pulmonary effort is normal.     Breath sounds: Normal breath sounds.  Skin:    General: Skin is warm and dry.     Capillary Refill: Capillary refill takes less than 2 seconds.  Neurological:     Mental Status: He is alert. Mental status is at baseline.  Psychiatric:        Mood and Affect: Mood normal.        Behavior: Behavior normal.    A/P: Non-sustained Vtach Review of telemetry shows Vtach, followed by a fib with RVR since resolved. Patient improved with metoprolol tartrate, will continue to monitor. - obtain BMP, Mg - will replete any electrolyte abnormalities  Gladys Damme, MD 01/06/2020, 9:38 PM PGY-2, [place] Medicine Service pager [phone]

## 2020-01-06 NOTE — Progress Notes (Signed)
Patient agitated and wants his daughter to come get him. Daughter was called twice and talked to patient. Patient c/o headache.  MD paged. Tylenol and seroquel ordered.

## 2020-01-06 NOTE — Progress Notes (Signed)
Notified by CCMD that patient had 18 beat run vtach while sitting in the chair. Patient placed back in bed. MD notified; labs ordered. Will continue to monitor

## 2020-01-07 ENCOUNTER — Inpatient Hospital Stay (HOSPITAL_COMMUNITY): Payer: Medicare HMO

## 2020-01-07 DIAGNOSIS — I361 Nonrheumatic tricuspid (valve) insufficiency: Secondary | ICD-10-CM

## 2020-01-07 LAB — CBC WITH DIFFERENTIAL/PLATELET
Abs Immature Granulocytes: 0.02 10*3/uL (ref 0.00–0.07)
Basophils Absolute: 0 10*3/uL (ref 0.0–0.1)
Basophils Relative: 0 %
Eosinophils Absolute: 0.1 10*3/uL (ref 0.0–0.5)
Eosinophils Relative: 3 %
HCT: 38.7 % — ABNORMAL LOW (ref 39.0–52.0)
Hemoglobin: 12.4 g/dL — ABNORMAL LOW (ref 13.0–17.0)
Immature Granulocytes: 0 %
Lymphocytes Relative: 30 %
Lymphs Abs: 1.5 10*3/uL (ref 0.7–4.0)
MCH: 30.1 pg (ref 26.0–34.0)
MCHC: 32 g/dL (ref 30.0–36.0)
MCV: 93.9 fL (ref 80.0–100.0)
Monocytes Absolute: 0.5 10*3/uL (ref 0.1–1.0)
Monocytes Relative: 9 %
Neutro Abs: 2.7 10*3/uL (ref 1.7–7.7)
Neutrophils Relative %: 58 %
Platelets: 143 10*3/uL — ABNORMAL LOW (ref 150–400)
RBC: 4.12 MIL/uL — ABNORMAL LOW (ref 4.22–5.81)
RDW: 13.4 % (ref 11.5–15.5)
WBC: 4.8 10*3/uL (ref 4.0–10.5)
nRBC: 0 % (ref 0.0–0.2)

## 2020-01-07 LAB — PROTIME-INR
INR: 2.4 — ABNORMAL HIGH (ref 0.8–1.2)
Prothrombin Time: 25.4 seconds — ABNORMAL HIGH (ref 11.4–15.2)

## 2020-01-07 LAB — GLUCOSE, CAPILLARY
Glucose-Capillary: 107 mg/dL — ABNORMAL HIGH (ref 70–99)
Glucose-Capillary: 140 mg/dL — ABNORMAL HIGH (ref 70–99)
Glucose-Capillary: 146 mg/dL — ABNORMAL HIGH (ref 70–99)
Glucose-Capillary: 145 mg/dL — ABNORMAL HIGH (ref 70–99)
Glucose-Capillary: 157 mg/dL — ABNORMAL HIGH (ref 70–99)

## 2020-01-07 LAB — VITAMIN B12: Vitamin B-12: 300 pg/mL (ref 180–914)

## 2020-01-07 LAB — ECHOCARDIOGRAM COMPLETE
AR max vel: 2.14 cm2
AV Area VTI: 2.23 cm2
AV Area mean vel: 2.17 cm2
AV Mean grad: 2.7 mmHg
AV Peak grad: 5 mmHg
Ao pk vel: 1.11 m/s
S' Lateral: 3 cm
Weight: 3326.3 oz

## 2020-01-07 LAB — RPR: RPR Ser Ql: NONREACTIVE

## 2020-01-07 MED ORDER — WARFARIN SODIUM 5 MG PO TABS
5.0000 mg | ORAL_TABLET | Freq: Once | ORAL | Status: AC
  Administered 2020-01-07: 5 mg via ORAL
  Filled 2020-01-07: qty 1

## 2020-01-07 NOTE — Progress Notes (Signed)
  Echocardiogram 2D Echocardiogram has been performed.  Jennette Dubin 01/07/2020, 3:23 PM

## 2020-01-07 NOTE — Progress Notes (Signed)
ANTICOAGULATION CONSULT NOTE - Initial Consult  Pharmacy Consult for warfarin Indication: atrial fibrillation  Allergies  Allergen Reactions  . Pineapple Concentrate Nausea And Vomiting    Patient Measurements: Height: 175.3 cm, 5'9"  Vital Signs: Temp: 97.8 F (36.6 C) (08/08 0736) Temp Source: Axillary (08/08 0736) BP: 99/82 (08/08 0736) Pulse Rate: 125 (08/08 0736)  Labs: Recent Labs    01/04/20 1125 01/05/20 0431 01/05/20 2139 01/06/20 1056 01/06/20 2212 01/07/20 0339  HGB  --  12.1*  --   --   --  12.4*  HCT  --  36.8*  --   --   --  38.7*  PLT  --  143*  --   --   --  143*  LABPROT  --  24.9*  --  23.7*  --  25.4*  INR  --  2.3*  --  2.2*  --  2.4*  CREATININE  --  1.10 1.06  --  0.93  --   CKTOTAL 347  --   --   --   --   --     Estimated Creatinine Clearance: 76.6 mL/min (by C-G formula based on SCr of 0.93 mg/dL).  Assessment: Patient is a 79 yo male presenting after an at-home fall with altered mental status. Patient has a history of atrial fibrillation treated with warfarin. Pharmacy has been consulted to dose warfarin. The patient's prior warfarin regimen is unknown and variable and could not be confirmed by patient or daughter per physician conversation.   The patient's CBC is currently stable with no signs of bleeding reported. Their INR today is therapeutic at 2.4.  Goal of Therapy:  INR 2-3 Monitor platelets by anticoagulation protocol: Yes   Plan:  Give warfarin PO 5mg  tonight Obtain daily PT/INR and monitor CBC Monitor for signs and symptoms of bleeding Monitor diet and oral intake  Norina Buzzard, PharmD PGY1 Pharmacy Resident 01/07/2020 8:34 AM  Please check [email or web address] for unit-specific pharmacy phone numbers.

## 2020-01-07 NOTE — Progress Notes (Signed)
Family Medicine Teaching Service Daily Progress Note Intern Pager: [phone]  Patient name: Matthew Edwards Medical record number: [id number] Date of birth: 03/29/1941 Age: 79 y.o. Gender: male  Primary Care Provider: Lind Covert, MD Consultants: None Code Status: FULL  Pt Overview and Major Events to Date:  Admitted 8/4  Assessment and Plan: Matthew Pursifull Cookis a 79 y.o.malepresenting s/p fall and found to be in A. fib with RVR. PMH is significant forHTN, T2DM with neuropathy, primary hyperparathyroidism, HLD, hypercalcemia, alcohol abuse, aortic valve replacement, atrial fibrillationon Coumadin, OSA, COPD.  Atrial Fibrillation with RVR: stable, improving Patient had 20-run V-Tach last night, HR 160's. Was given extra dose of metoprolol 37.5mg  with improvement of v-tach and HR lowered to low 100's. HR 110's-120's overnight. In room, patient with irregularly irregular rhythm with HR to 110's-120's, asymptomatic.  - Continuous cardiac monitoring - Continue Metoprolol tartrate 37.5mg  BID - Echo ordered   PossibleDementia  Unsteady Gait Continue to await placement to SNF. Labs for reversible causes of dementia ordered, HIV antibody: non-reactive, Vit B12: 300, RPR: non-reactive, TSH: 1.75 in June.   - Delirium precautions  - Avoid delirium-genic medications - Fall precautions - Out of bed with assistance  Hypertension BP: 102/83. On Metorpolol tartrate 37.5 BID. Home medications: benazepril 20 mg daily, lasix 40mg  BID - Continue to hold home medications due to hypotension - Add medications back as needed - Strict I/O - Vitals q8h   Type 2 Diabetes Mellitus: chronic, stable Not on any home medications. CBG's: 107-134 overnight. Afternoon CBG 140, received 1 unit insulin aspart.  - sensitive SSI - Monitor CBG's daily - Carb modified diet  Hx of Aortic Valve Repalcement  CAD: chronic, stable. Patient with history of cardiac catheterization 06/2010, coronary artery  bypass graft and aortic valve replacement 07/28/2010, coronary angioplasty with stent placement 10/01/2010.  At home patient on atorvastatin 40mg  qd, nitroglycerin PRN, metoprolol succinate 25mg  daily, benazepril 20mg  daily, warfarin 10mg  Thursdays and 7.5mg  all other days. AM XKG8.4 - Continue homeAtorvastatin - On metoprolol tartrate 37.5 BID currently - Hold home Benazepril d/t low blood pressures, restart as needed - Daily INR, Warfarin dosed by pharmacy  Hx HLD: chronic, stable, improved Last lipid panel in 2017-Triglycerides elevated to 181, VLDL 36. Home medication notable for atorvastatin 40mg . Repeat lipid panel WNL. - Continue Atorvastatin40mg   COPD Patient on Symbicort at home.  No wheezing appreciated on exam today.  Patient not having a respiratory distress. -Breo Ellipta on formulary.  1 puff daily  FEN/GI: Carb modified diet  PPx: Warfarin dosed per pharmacy  Disposition: Discharge pending echo results  Subjective:  Matthew Edwards does not have complaints this morning. Still wondering when he will go to rehab facility. He states that he has been told several times to not get out of bed on his own and understands this and the reason for it. He denies CP, SOB or palpitations.   Objective: Temp:  [97.3 F (36.3 C)-99.4 F (37.4 C)] 99.4 F (37.4 C) (08/07 2032) Pulse Rate:  [47-141] 123 (08/08 0600) Resp:  [18-25] 18 (08/08 0600) BP: (78-136)/(58-104) 126/70 (08/08 0600) SpO2:  [93 %-100 %] 95 % (08/08 0600) Physical Exam: General: Alert, oriented to person, place, time and situation Cardiovascular: Irregularly irregular, HR 110's Respiratory: CTAB Abdomen: soft, non-tender in all quadrants Extremities: trace edema   Laboratory: Recent Labs  Lab 01/04/20 0642 01/05/20 0431 01/07/20 0339  WBC 6.0 6.1 4.8  HGB 12.1* 12.1* 12.4*  HCT 36.8* 36.8* 38.7*  PLT 134* 143*  143*   Recent Labs  Lab 01/03/20 0210 01/04/20 0642 01/05/20 0431 01/05/20 2139  01/06/20 2212  NA  --    < > 137 136 135  K  --    < > 4.3 4.1 4.3  CL  --    < > 102 102 103  CO2  --    < > 27 26 23   BUN  --    < > 10 11 12   CREATININE  --    < > 1.10 1.06 0.93  CALCIUM  --    < > 10.1 10.4* 10.4*  PROT 7.7  --  5.9*  --   --   BILITOT 2.3*  --  1.4*  --   --   ALKPHOS 81  --  60  --   --   ALT 23  --  19  --   --   AST 55*  --  32  --   --   GLUCOSE  --    < > 131* 143* 118*   < > = values in this interval not displayed.    INR: 2.4  Imaging/Diagnostic Tests: None new  Sharion Settler, DO 01/07/2020, 7:36 AM PGY-1, [place] Intern pager: [phone], text pages welcome

## 2020-01-08 LAB — GLUCOSE, CAPILLARY
Glucose-Capillary: 123 mg/dL — ABNORMAL HIGH (ref 70–99)
Glucose-Capillary: 140 mg/dL — ABNORMAL HIGH (ref 70–99)
Glucose-Capillary: 129 mg/dL — ABNORMAL HIGH (ref 70–99)
Glucose-Capillary: 108 mg/dL — ABNORMAL HIGH (ref 70–99)

## 2020-01-08 LAB — PROTIME-INR
INR: 2.6 — ABNORMAL HIGH (ref 0.8–1.2)
Prothrombin Time: 27.3 seconds — ABNORMAL HIGH (ref 11.4–15.2)

## 2020-01-08 MED ORDER — BACITRACIN ZINC 500 UNIT/GM EX OINT
TOPICAL_OINTMENT | Freq: Two times a day (BID) | CUTANEOUS | Status: DC
  Filled 2020-01-08: qty 28.4

## 2020-01-08 MED ORDER — WARFARIN SODIUM 5 MG PO TABS
5.0000 mg | ORAL_TABLET | Freq: Every day | ORAL | Status: DC
  Administered 2020-01-08 – 2020-01-10 (×3): 5 mg via ORAL
  Filled 2020-01-08 (×3): qty 1

## 2020-01-08 NOTE — Progress Notes (Addendum)
Family Medicine Teaching Service Daily Progress Note Intern Pager: [phone]  Patient name: Matthew Edwards Medical record number: [id number] Date of birth: 07/17/1940 Age: 79 y.o. Gender: male  Primary Care Provider: Lind Covert, MD Consultants: None Code Status: FULL  Pt Overview and Major Events to Date:  Admitted 8/4  Assessment and Plan: Maksymilian Mabey Cookis a 79 y.o.malepresenting s/p falland found to be in A. fib with RVR. PMH is significant forHTN, T2DM with neuropathy, primary hyperparathyroidism, HLD, hypercalcemia, alcohol abuse, aortic valve replacement, atrial fibrillationon Coumadin, OSA, COPD.  Atrial Fibrillation with RVR: stable, improving HR 60's-80's overnight. In room, patient with irregularly irregular rhythm with HR to 100's-110's this AM in room. Patient asymptomatic, no complaints of chest pain, palpitations or trouble breathing. - Continuous cardiac monitoring - Continue Metoprolol tartrate 37.5mg  BID - Echo: EF 60-65%, mitral stenosis and PAH. Will d/c with referral to cardiology  PossibleDementia  Unsteady Gait  s/p fall Continue to await placement to SNF. Labs for reversible causes of dementia ordered and negative including HIV, RPR, TSH, B12. Patient with wound to right upper back. Does not appear erythematous or infected. Tender to palpation. Picture below in exam. - Delirium precautions  - Avoid delirium-genic medications - Fall precautions - Out of bed with assistance - Bacitracin to wound, will continue to monitor. No wound care consult at this point.  History of Hypertension: stable, chronic AM BP: 108/58. It appears as though patient had one elevated reading of 213/196 overnight. Recheck one hour later was 106/71. BP's have been consistently on the soft side. Currently on Metorpolol tartrate 37.5 BID. Home medications: benazepril 20 mg daily, lasix 40mg  BID - Continue to hold home medications due to soft pressures - Add medications  back as needed - Strict I/O - Vitals q8h  - Continue Metoprolol 37.5 mg BID  Type 2 Diabetes Mellitus: chronic, stable Not on any home medications. CBG's: 123-157 overnight. One unit insulin aspart this AM. - sensitive SSI - Monitor CBG's daily - Carb modified diet  Hx of Aortic Valve Repalcement  CAD: chronic, stable. Patient with history of cardiac catheterization 06/2010, coronary artery bypass graft and aortic valve replacement 07/28/2010, coronary angioplasty with stent placement 10/01/2010.At home patient on atorvastatin 40mg  qd, nitroglycerin PRN, metoprolol succinate 25mg  daily, benazepril 20mg  daily, warfarin 10mg  Thursdays and 7.5mg  all other days. AM DXI3.6 - Continue homeAtorvastatin - On metoprolol tartrate37.5BID currently - Hold home Benazepril d/t low blood pressures, restart as needed - Daily INR, Warfarin dosed by pharmacy  Hx HLD: chronic, stable, improved Last lipid panel in 2017-Triglycerides elevated to 181, VLDL 36. Home medication notable for atorvastatin 40mg . Repeat lipid panel WNL. - Continue Atorvastatin40mg   COPD Patient on Symbicort at home.  -Breo Ellipta on formulary. 1 puff daily  FEN/GI: Carb modified diet  PPx: Warfarin dosed per pharmacy  Disposition: Medically stable for SNF. Awaiting insurance authorization.   Subjective:  Patient does endorse some left-sided back pain. He notes that he does not particularly enjoy being in the hospital. Feels as though he could do rehab at home. He is wondering about updates on rehab facility and asks if I spoke with his daughter last night. No chest pain, trouble breathing. Was able to sleep well this morning but states it was hard for him to fall asleep last night.   Objective: Temp:  [97.5 F (36.4 C)-99.3 F (37.4 C)] 99.3 F (37.4 C) (08/09 0422) Pulse Rate:  [69-125] 81 (08/09 0422) Resp:  [15-20] 20 (08/09 0422) BP: (99-213)/(58-196)  108/58 (08/09 0422) SpO2:  [95 %-98 %] 97 % (08/08  2354) Physical Exam: General: Awake, alert, oriented to self, time and place Cardiovascular: Irregularly irregular, HR low 100's Respiratory: CTAB Abdomen: soft, non-tender in all quadrants  Extremities: 2+ PT left foot, difficulty palpating PT on right but 2+ DP right foot. Irregularly irregular radial pulses, 2+. Derm: posterior left upper back with tender but non-erythematous wound. No obvious signs of infection.       Laboratory: Recent Labs  Lab 01/04/20 0642 01/05/20 0431 01/07/20 0339  WBC 6.0 6.1 4.8  HGB 12.1* 12.1* 12.4*  HCT 36.8* 36.8* 38.7*  PLT 134* 143* 143*   Recent Labs  Lab 01/03/20 0210 01/04/20 0642 01/05/20 0431 01/05/20 2139 01/06/20 2212  NA  --    < > 137 136 135  K  --    < > 4.3 4.1 4.3  CL  --    < > 102 102 103  CO2  --    < > 27 26 23   BUN  --    < > 10 11 12   CREATININE  --    < > 1.10 1.06 0.93  CALCIUM  --    < > 10.1 10.4* 10.4*  PROT 7.7  --  5.9*  --   --   BILITOT 2.3*  --  1.4*  --   --   ALKPHOS 81  --  60  --   --   ALT 23  --  19  --   --   AST 55*  --  32  --   --   GLUCOSE  --    < > 131* 143* 118*   < > = values in this interval not displayed.    Imaging/Diagnostic Tests: Echo 8/8 IMPRESSIONS: 1. Left ventricular ejection fraction, by estimation, is 60 to 65%. The  left ventricle has normal function. Left ventricular endocardial border  not optimally defined to evaluate regional wall motion. There is mild  asymmetric left ventricular hypertrophy  of the septal segment. Left ventricular diastolic parameters are  indeterminate.  2. Right ventricular systolic function is moderately reduced. The right  ventricular size is normal. There is moderately elevated pulmonary artery  systolic pressure. The estimated right ventricular systolic pressure is  88.8 mmHg.  3. Left atrial size was severely dilated.  4. Right atrial size was severely dilated.  5. The mitral valve is degenerative. Mild mitral valve  regurgitation.  Moderate mitral stenosis. The mean mitral valve gradient is 7.0 mmHg with  average heart rate of 97 bpm.  6. Tricuspid valve regurgitation is moderate to severe.  7. The aortic valve has been repaired/replaced. Aortic valve  regurgitation is not visualized. There is a 25 mm Edwards pericardial  valve present in the aortic position. Procedure Date: 07/28/2010. Aortic  valve mean gradient measures 2.7 mmHg.  8. The inferior vena cava is dilated in size with >50% respiratory  variability, suggesting right atrial pressure of 8 mmHg.   Sharion Settler, DO 01/08/2020, 5:40 AM PGY-1, [place] Intern pager: [phone], text pages welcome

## 2020-01-08 NOTE — Progress Notes (Signed)
Physical Therapy Treatment Patient Details Name: Matthew Edwards MRN: [id number] DOB: [birth year] Today's Date: 01/08/2020    History of Present Illness Patient is a 79 y/o male with history of atrial fibrillation on coumadin, HLD HTN, GERD, CHF, COPD, CAD s/p CABG/STENTS, OSA, T2DM who lives alone, presents after being found on the floor of his home by his daughter last evening (01/01/20). It is unknown how long he was on the floor. Daughter reports she thinks he fell in the kitchen and crawled into the living room where she found him. No wound or bleeding. The patient cannot provide details of his fall. He denies current pain, SOB, nausea, headache or visual change.     PT Comments    Pt received sitting up in recliner chair. He seemed confused asking therapist to call his daughter to come pick him up. Assisted pt in calling daughter and reminded him that he would be transferred to a rehab facility before returning home. Pt disorientated to time, follows commands inconsistently, and requires multimodal cues. Attempted to stand from recliner chair 3x. Pt unable to come fully upright due to weight shifted posteriorly. Pt likely fatigued from sitting up all day. RN advised +2 and use of stedy may be necessary for return to bed. Will continue to follow acutely for mobility progression.     Follow Up Recommendations  SNF;Supervision/Assistance - 24 hour     Equipment Recommendations  None recommended by PT    Recommendations for Other Services       Precautions / Restrictions Precautions Precautions: Fall    Mobility  Bed Mobility Overal bed mobility:  (presented sitting in chair)                Transfers Overall transfer level: Needs assistance Equipment used: Rolling walker (2 wheeled) Transfers: Sit to/from Stand Sit to Stand: Max assist         General transfer comment: Unable to come fully upright with 3x attempt to rise from recliner chair. Cues for hand placement and  technique. Pt likely fatigued from sitting all day. RN advised +2 and use of stedy may be necessary for return to bed.  Ambulation/Gait                 Stairs             Wheelchair Mobility    Modified Rankin (Stroke Patients Only)       Balance Overall balance assessment: Needs assistance Sitting-balance support: No upper extremity supported;Bilateral upper extremity supported Sitting balance-Leahy Scale: Good                                      Cognition Arousal/Alertness: Awake/alert Behavior During Therapy: WFL for tasks assessed/performed Overall Cognitive Status: History of cognitive impairments - at baseline Area of Impairment: Safety/judgement;Orientation                 Orientation Level: Time       Safety/Judgement: Decreased awareness of safety;Decreased awareness of deficits Awareness: Intellectual Problem Solving: Slow processing;Difficulty sequencing General Comments: Slow processing and possibly HOH. Multimodal cues required. Pt stating the date was September 29, and when advised it was auguest 9, he persiverated on Aug 29 and could not answer when questioned about the year.       Exercises      General Comments        Pertinent Vitals/Pain Pain Assessment:  Faces Faces Pain Scale: Hurts a little bit Pain Location: "all over" Pain Descriptors / Indicators: Aching Pain Intervention(s): Monitored during session;Limited activity within patient's tolerance    Home Living                      Prior Function            PT Goals (current goals can now be found in the care plan section) Acute Rehab PT Goals Patient Stated Goal: to move more PT Goal Formulation: With patient Time For Goal Achievement: 01/10/20 Potential to Achieve Goals: Good Progress towards PT goals: Progressing toward goals    Frequency    Min 3X/week      PT Plan Current plan remains appropriate    Co-evaluation               AM-PAC PT "6 Clicks" Mobility   Outcome Measure  Help needed turning from your back to your side while in a flat bed without using bedrails?: None Help needed moving from lying on your back to sitting on the side of a flat bed without using bedrails?: None Help needed moving to and from a bed to a chair (including a wheelchair)?: A Lot Help needed standing up from a chair using your arms (e.g., wheelchair or bedside chair)?: A Lot Help needed to walk in hospital room?: A Little Help needed climbing 3-5 steps with a railing? : A Lot 6 Click Score: 17    End of Session Equipment Utilized During Treatment: Gait belt Activity Tolerance: Patient tolerated treatment well Patient left: in chair;with call bell/phone within reach;with chair alarm set Nurse Communication: Mobility status;Need for lift equipment PT Visit Diagnosis: Unsteadiness on feet (R26.81);Other abnormalities of gait and mobility (R26.89);History of falling (Z91.81);Muscle weakness (generalized) (M62.81)     Time: 1421-1450 PT Time Calculation (min) (ACUTE ONLY): 29 min  Charges:                        [name], PTA Pager [phone] Acute Rehab   Allena Katz 01/08/2020, 2:57 PM

## 2020-01-08 NOTE — Progress Notes (Addendum)
ANTICOAGULATION CONSULT NOTE - Initial Consult  Pharmacy Consult for warfarin Indication: atrial fibrillation  Allergies  Allergen Reactions   Pineapple Concentrate Nausea And Vomiting    Patient Measurements: Height: 175.3 cm, 5'9"  Vital Signs: Temp: 98.6 F (37 C) (08/09 0735) Temp Source: Oral (08/09 0735) BP: 102/74 (08/09 0840) Pulse Rate: 113 (08/09 0840)  Labs: Recent Labs    01/05/20 2139 01/06/20 1056 01/06/20 2212 01/07/20 0339 01/08/20 0309  HGB  --   --   --  12.4*  --   HCT  --   --   --  38.7*  --   PLT  --   --   --  143*  --   LABPROT  --  23.7*  --  25.4* 27.3*  INR  --  2.2*  --  2.4* 2.6*  CREATININE 1.06  --  0.93  --   --     Estimated Creatinine Clearance: 76.6 mL/min (by C-G formula based on SCr of 0.93 mg/dL).  Assessment: Patient is a 79 yo male presenting after an at-home fall with altered mental status. Patient has a history of atrial fibrillation treated with warfarin. Pharmacy has been consulted to dose warfarin. The patient's prior warfarin regimen is unknown and variable and could not be confirmed by patient or daughter per physician conversation.   The patient's CBC is currently stable with no signs of bleeding reported. Their INR today is therapeutic at 2.6. Patient is awaiting rehab placement but has been stable on 5mg  daily while inpatient. Will schedule INR's on MWF and warfarin 5mg  daily.   Goal of Therapy:  INR 2-3 Monitor platelets by anticoagulation protocol: Yes   Plan:  Give warfarin PO 5mg  daily Obtain PT/INR on MWF and monitor CBC MWF Monitor for signs and symptoms of bleeding Monitor diet and oral intake  Cephus Slater, PharmD, [facility] Pharmacy Resident [phone] 01/08/2020 9:15 AM Please check [email or web address] for unit-specific pharmacy phone numbers.

## 2020-01-08 NOTE — TOC Progression Note (Addendum)
Transition of Care [facility]) - Progression Note    Patient Details  Name: [name] MRN: [id number] Date of Birth: 07/04/1940  Transition of Care [facility]) CM/SW [place], [place] Phone Number: 01/08/2020, 12:37 PM  Clinical Narrative:     CSW visit  With patient ar bedside. CSW introduced self and explained role. CSW discussed with patient PT recommendation of short term rehab at [facility]. Patient states he was agreeable to SNF is his daughter, [name] is agreeable to SNF placement. CSW informed patient, TOC team has been working talking with his daughters and they are in agreement with SNF.   CSW contacted [facility] per family's request- they confirmed they are unable to make bed offer. CSW spoke on the phone with patient's daughters [name] and [name], CSW informed of bed offers- they chose [facility]   CSW contacted [facility] to confirm availability- waiting on response- SNF will have to start insurance authorization once they confirm availability.   Thurmond Butts, MSW, [place] Clinical Social Worker    Expected Discharge Plan: Skilled Nursing Facility Barriers to Discharge: Continued Medical Work up, Ship broker, SNF Pending bed offer  Expected Discharge Plan and Services Expected Discharge Plan: [place] In-house Referral: Clinical Social Work     Living arrangements for the past 2 months: Single Family Home                                       Social Determinants of Health (SDOH) Interventions    Readmission Risk Interventions No flowsheet data found.

## 2020-01-09 LAB — GLUCOSE, CAPILLARY
Glucose-Capillary: 113 mg/dL — ABNORMAL HIGH (ref 70–99)
Glucose-Capillary: 161 mg/dL — ABNORMAL HIGH (ref 70–99)
Glucose-Capillary: 108 mg/dL — ABNORMAL HIGH (ref 70–99)
Glucose-Capillary: 134 mg/dL — ABNORMAL HIGH (ref 70–99)

## 2020-01-09 NOTE — Progress Notes (Signed)
Family Medicine Teaching Service Daily Progress Note Intern Pager: [phone]  Patient name: Matthew Edwards Medical record number: [id number] Date of birth: 1940/09/02 Age: 79 y.o. Gender: male  Primary Care Provider: Lind Covert, MD Consultants: None Code Status: FULL  Pt Overview and Major Events to Date:  Admitted 8/4  Assessment and Plan: Matthew Edwards a 79 y.o.malepresenting s/p falland found to be in A. fib with RVR. PMH is significant forHTN, T2DM with neuropathy, primary hyperparathyroidism, HLD, hypercalcemia, alcohol abuse, aortic valve replacement, atrial fibrillationon Coumadin, OSA, COPD.   Atrial Fibrillation with RVR: stable, improving HR 88-103 overnight.In room, patient with irregularly irregular rhythm with HR to 90-110's on telemetry this AM in room. Patient asleep during encounter. - Continuous cardiac monitoring - Continue Metoprolol tartrate 37.5mg  BID - Echo: EF 60-65%, mitral stenosis and PAH. Will d/c with referral to cardiology. Daughter updated with these results and plan.  PossibleDementia  Unsteady Gait  s/p fall Continue to await placement to SNF. Labs for reversible causes of dementia ordered and negative including HIV, RPR, TSH, B12. - Delirium precautions  - Avoid delirium-genic medications - Fall precautions - Out of bed with assistance - Bacitracin to right upper back wound, will continue to monitor. No wound care consult at this point.  History of Hypertension: stable, chronic AM BP:91/72. Normotensive overnight.  Currently on Metorpolol tartrate 37.5 BID. Home medications:benazepril 20 mg daily, lasix 40mg  BID - Continue to hold home medications due to soft pressures - Add medications back as needed - Strict I/O - Vitals q8h - Continue Metoprolol 37.5 mg BID  Type 2 Diabetes Mellitus: chronic, stable Not on any home medications. CBG's: 108-113 overnight.  - sensitive SSI - Monitor CBG's daily - Carb modified  diet  Hx of Aortic Valve Repalcement  CAD: chronic, stable. Patient with history of cardiac catheterization 06/2010, coronary artery bypass graft and aortic valve replacement 07/28/2010, coronary angioplasty with stent placement 10/01/2010.At home patient on atorvastatin 40mg  qd, nitroglycerin PRN, metoprolol succinate 25mg  daily, benazepril 20mg  daily, warfarin 10mg  Thursdays and 7.5mg  all other days. INR 2.6 yesterday. - Continue homeAtorvastatin - On metoprolol tartrate37.5BID currently - Hold home Benazepril d/t low blood pressures, restart as needed - INR, Warfarin dosed by pharmacy  Hx HLD: chronic, stable, improved Last lipid panel in 2017-Triglycerides elevated to 181, VLDL 36. Home medication notable for atorvastatin 40mg . Repeat lipid panel WNL. - Continue Atorvastatin40mg   COPD Patient on Symbicort at home.  -Breo Ellipta on formulary. 1 puff daily  FEN/GI:Carb modified diet [id number] dosed per pharmacy  Disposition: Medically stable for discharge. Awaiting insurance authorization for SNF.  Subjective:  Patient asleep during encounter today. Resting comfortably.   Objective: Temp:  [98.4 F (36.9 C)-99.9 F (37.7 C)] 98.4 F (36.9 C) (08/10 0513) Pulse Rate:  [62-114] 114 (08/10 0513) Resp:  [12-23] 20 (08/10 0513) BP: (95-123)/(63-86) 112/66 (08/10 0513) SpO2:  [95 %-96 %] 96 % (08/10 0513) Physical Exam: General: Asleep Cardiovascular: Irregularly irregular HR 90-110 Respiratory: clear to ausculation anteriorly, no increased work of breathing, snoring  Abdomen: normoactive bowel sounds Extremities: no edema, 2+ PT pulses b/l  Laboratory: Recent Labs  Lab 01/04/20 0642 01/05/20 0431 01/07/20 0339  WBC 6.0 6.1 4.8  HGB 12.1* 12.1* 12.4*  HCT 36.8* 36.8* 38.7*  PLT 134* 143* 143*   Recent Labs  Lab 01/03/20 0210 01/04/20 0642 01/05/20 0431 01/05/20 2139 01/06/20 2212  NA  --    < > 137 136 135  K  --    < >  4.3 4.1 4.3  CL  --     < > 102 102 103  CO2  --    < > 27 26 23   BUN  --    < > 10 11 12   CREATININE  --    < > 1.10 1.06 0.93  CALCIUM  --    < > 10.1 10.4* 10.4*  PROT 7.7  --  5.9*  --   --   BILITOT 2.3*  --  1.4*  --   --   ALKPHOS 81  --  60  --   --   ALT 23  --  19  --   --   AST 55*  --  32  --   --   GLUCOSE  --    < > 131* 143* 118*   < > = values in this interval not displayed.    Imaging/Diagnostic Tests: None new.  Sharion Settler, DO 01/09/2020, 6:15 AM PGY-1, [place] Intern pager: [phone], text pages welcome

## 2020-01-09 NOTE — TOC Progression Note (Signed)
Transition of Care [facility]) - Progression Note    Patient Details  Name: Matthew Edwards MRN: [id number] Date of Birth: [birth year]  Transition of Care [facility] CM/SW [place], [place] Phone Number: 01/09/2020, 10:01 AM  Clinical Narrative:     [facility] confirmed availability and started insurance auth.  Thurmond Butts, MSW, [place] Clinical Social Worker   Expected Discharge Plan: Skilled Nursing Facility Barriers to Discharge: Continued Medical Work up, Ship broker, SNF Pending bed offer  Expected Discharge Plan and Services Expected Discharge Plan: [place] In-house Referral: Clinical Social Work     Living arrangements for the past 2 months: Single Family Home                                       Social Determinants of Health (SDOH) Interventions    Readmission Risk Interventions No flowsheet data found.

## 2020-01-10 LAB — CBC
HCT: 35.8 % — ABNORMAL LOW (ref 39.0–52.0)
Hemoglobin: 11.8 g/dL — ABNORMAL LOW (ref 13.0–17.0)
MCH: 31 pg (ref 26.0–34.0)
MCHC: 33 g/dL (ref 30.0–36.0)
MCV: 94 fL (ref 80.0–100.0)
Platelets: 175 10*3/uL (ref 150–400)
RBC: 3.81 MIL/uL — ABNORMAL LOW (ref 4.22–5.81)
RDW: 13.4 % (ref 11.5–15.5)
WBC: 6.7 10*3/uL (ref 4.0–10.5)
nRBC: 0 % (ref 0.0–0.2)

## 2020-01-10 LAB — PROTIME-INR
INR: 2.6 — ABNORMAL HIGH (ref 0.8–1.2)
Prothrombin Time: 26.7 seconds — ABNORMAL HIGH (ref 11.4–15.2)

## 2020-01-10 LAB — GLUCOSE, CAPILLARY
Glucose-Capillary: 131 mg/dL — ABNORMAL HIGH (ref 70–99)
Glucose-Capillary: 144 mg/dL — ABNORMAL HIGH (ref 70–99)
Glucose-Capillary: 111 mg/dL — ABNORMAL HIGH (ref 70–99)
Glucose-Capillary: 175 mg/dL — ABNORMAL HIGH (ref 70–99)

## 2020-01-10 LAB — SARS CORONAVIRUS 2 (TAT 6-24 HRS): SARS Coronavirus 2: NEGATIVE

## 2020-01-10 MED ORDER — SENNA 8.6 MG PO TABS
1.0000 | ORAL_TABLET | Freq: Every day | ORAL | Status: DC
  Administered 2020-01-10 – 2020-01-11 (×2): 8.6 mg via ORAL
  Filled 2020-01-10: qty 1

## 2020-01-10 MED ORDER — MAGNESIUM CITRATE PO SOLN
1.0000 | Freq: Once | ORAL | Status: DC
  Filled 2020-01-10: qty 296

## 2020-01-10 NOTE — Progress Notes (Signed)
ANTICOAGULATION CONSULT NOTE - Initial Consult  Pharmacy Consult for warfarin Indication: atrial fibrillation  Allergies  Allergen Reactions  . Pineapple Concentrate Nausea And Vomiting    Patient Measurements: Height: 175.3 cm, 5'9"  Vital Signs: Temp: 97.6 F (36.4 C) (08/11 0728) Temp Source: Oral (08/11 0728) BP: 118/76 (08/11 0728) Pulse Rate: 111 (08/11 0728)  Labs: Recent Labs    01/08/20 0309 01/10/20 0452  HGB  --  11.8*  HCT  --  35.8*  PLT  --  175  LABPROT 27.3* 26.7*  INR 2.6* 2.6*    Estimated Creatinine Clearance: 76.6 mL/min (by C-G formula based on SCr of 0.93 mg/dL).  Assessment: Patient is a 79 yo male presenting after an at-home fall with altered mental status. Patient has a history of atrial fibrillation treated with warfarin. Pharmacy has been consulted to dose warfarin. The patient's prior warfarin regimen is unknown and variable and could not be confirmed by patient or daughter per physician conversation.   The patient's CBC is currently stable with no signs of bleeding observed. The INR on 8/11 is therapeutic at 2.6. Patient is awaiting rehab placement and is currently receiving warfarin 5mg  daily with INR's scheduled MWF. Patient's oral intake is stable.   Goal of Therapy:  INR 2-3 Monitor platelets by anticoagulation protocol: Yes   Plan:  Give warfarin PO 5mg  daily Obtain PT/INR on MWF and monitor CBC MWF Monitor for signs and symptoms of bleeding Monitor diet and oral intake  Cephus Slater, PharmD, [facility] Pharmacy Resident [phone] 01/10/2020 8:26 AM Please check [email or web address] for unit-specific pharmacy phone numbers.

## 2020-01-10 NOTE — Progress Notes (Signed)
Family Medicine Teaching Service Daily Progress Note Intern Pager: [phone]  Patient name: Matthew Edwards Medical record number: [id number] Date of birth: Mar 03, 1941 Age: 79 y.o. Gender: male  Primary Care Provider: Lind Covert, MD Consultants: None Code Status: FULL  Pt Overview and Major Events to Date:  Admitted 8/4  Assessment and Plan: Matthew Edwards a 79 y.o.malepresenting s/p falland found to be in A. fib with RVR. PMH is significant forHTN, T2DM with neuropathy, primary hyperparathyroidism, HLD, hypercalcemia, alcohol abuse, aortic valve replacement, atrial fibrillationon Coumadin, OSA, COPD.  Atrial Fibrillation with RVR: stable, improved HR99-114 overnight.In room, patient with irregularly irregular rhythm with HR tolow 100's on telemetry this AM in room.  - Continuous cardiac monitoring - Continue Metoprolol tartrate 37.5mg  BID - Echo: EF 60-65%, mitral stenosis and PAH. Will d/c with referral to cardiology. Daughter updated with these results and plan.  PossibleDementia  Unsteady Gait s/p fall Continue to await placement to SNF. Labs for reversible causes of dementia orderedand negative including HIV, RPR, TSH, B12. - Delirium precautions  - Avoid delirium-genic medications - Fall precautions - Out of bed with assistance - Bacitracin and dressing to right upper back wound, will continue to monitor. - OOB with assistance for meals  History of Hypertension: stable, chronic AMBP:118/76.Normotensive overnight.  Currently on Metorpolol tartrate 37.5 BID. Home medications:benazepril 20 mg daily, lasix 40mg  BID - Continue to hold home medications due tosoft pressures - Add medications back as needed - Strict I/O - Vitals q8h - Continue Metoprolol 37.5 mg BID  Type 2 Diabetes Mellitus: chronic, stable Not on any home medications. CBG's:131-134overnight. 1 unit of insulin aspart this AM. - sensitive SSI - Monitor CBG's daily - Carb  modified diet  Hx of Aortic Valve Repalcement  CAD: chronic, stable. Patient with history of cardiac catheterization 06/2010, coronary artery bypass graft and aortic valve replacement 07/28/2010, coronary angioplasty with stent placement 10/01/2010.At home patient on atorvastatin 40mg  qd, nitroglycerin PRN, metoprolol succinate 25mg  daily, benazepril 20mg  daily, warfarin 10mg  Thursdays and 7.5mg  all other days. INR 2.6 this AM. - Continue homeAtorvastatin - On metoprolol tartrate37.5BID currently - Hold home Benazepril d/t low blood pressures, restart as needed - INR, Warfarin dosed by pharmacy  Hx HLD: chronic, stable, improved Home medication notable for atorvastatin 40mg . - Continue Atorvastatin40mg   COPD Patient on Symbicort at home.  -Breo Ellipta on formulary. 1 puff daily  FEN/GI:Carb modified diet FUX:NATFTDDU dosed per pharmacy  Disposition: Medically stable for SNF. Awaiting insurance authorization or [employer]  Subjective:  Patient feels sluggish this morning. He has not eaten breakfast yet. Still notes pain to his left upper back. No chest pain or trouble breathing.  Objective: Temp:  [97.4 F (36.3 C)-98.4 F (36.9 C)] 97.4 F (36.3 C) (08/11 0338) Pulse Rate:  [75-114] 99 (08/11 0338) Resp:  [17-25] 20 (08/11 0338) BP: (91-134)/(64-91) 134/86 (08/11 0338) SpO2:  [94 %-98 %] 95 % (08/11 0338) Physical Exam: General: Awake, no acute distress Cardiovascular: irregularly irregular Respiratory: CTAB Abdomen: soft, non-tender in all quadrants Extremities: no edema  Laboratory: Recent Labs  Lab 01/04/20 0642 01/05/20 0431 01/07/20 0339  WBC 6.0 6.1 4.8  HGB 12.1* 12.1* 12.4*  HCT 36.8* 36.8* 38.7*  PLT 134* 143* 143*   Recent Labs  Lab 01/05/20 0431 01/05/20 2139 01/06/20 2212  NA 137 136 135  K 4.3 4.1 4.3  CL 102 102 103  CO2 27 26 23   BUN 10 11 12   CREATININE 1.10 1.06 0.93  CALCIUM 10.1 10.4*  10.4*  PROT 5.9*  --   --    BILITOT 1.4*  --   --   ALKPHOS 60  --   --   ALT 19  --   --   AST 32  --   --   GLUCOSE 131* 143* 118*     Imaging/Diagnostic Tests: None new  Sharion Settler, DO 01/10/2020, 5:33 AM PGY-1, [place] Intern pager: [phone], text pages welcome

## 2020-01-10 NOTE — Progress Notes (Signed)
Physical Therapy Treatment Patient Details Name: Matthew Edwards MRN: [id number] DOB: 1940/06/02 Today's Date: 01/10/2020    History of Present Illness Patient is a 79 y/o male with history of atrial fibrillation on coumadin, HLD HTN, GERD, CHF, COPD, CAD s/p CABG/STENTS, OSA, T2DM who lives alone, presents after being found on the floor of his home by his daughter last evening (01/01/20). It is unknown how long he was on the floor. Daughter reports she thinks he fell in the kitchen and crawled into the living room where she found him. No wound or bleeding. The patient cannot provide details of his fall. He denies current pain, SOB, nausea, headache or visual change.     PT Comments    Patient progressing slowly this session due to knees buckling with ambulation so limited for safety,  Did perform sit <>stand at bedside and work on upper body mobility as reports L shoulder injury during fall.  Patient will continue to benefit from skilled PT in the acute setting and continue SNF recommendation for d/c.    Follow Up Recommendations  SNF;Supervision/Assistance - 24 hour     Equipment Recommendations  None recommended by PT    Recommendations for Other Services       Precautions / Restrictions Precautions Precautions: Fall    Mobility  Bed Mobility Overal bed mobility: Needs Assistance Bed Mobility: Supine to Sit;Sit to Supine     Supine to sit: Mod assist Sit to supine: Min assist   General bed mobility comments: assist to lift trunk, assist to supine for positioning  Transfers Overall transfer level: Needs assistance Equipment used: Rolling walker (2 wheeled) Transfers: Sit to/from [facility] Sit to Stand: Min assist;Mod assist;From elevated surface Stand pivot transfers: Min assist       General transfer comment: practiced from mildly higher than bed level x 6 reps; stepping to [facility] with min A with RW A due to knees  buckling  Ambulation/Gait Ambulation/Gait assistance: Min assist Gait Distance (Feet): 60 Feet Assistive device: Rolling walker (2 wheeled) Gait Pattern/deviations: Step-through pattern;Decreased stride length;Wide base of support;Trunk flexed     General Gait Details: L knee buckling more than R so limited distance,  Cues for posture and forward gaze   Stairs             Wheelchair Mobility    Modified Rankin (Stroke Patients Only)       Balance Overall balance assessment: Needs assistance   Sitting balance-Leahy Scale: Fair Sitting balance - Comments: somewhat leaning back, but no LOB   Standing balance support: Bilateral upper extremity supported Standing balance-Leahy Scale: Good Standing balance comment: assist with UE 's for balance                            Cognition Arousal/Alertness: Awake/alert Behavior During Therapy: WFL for tasks assessed/performed Overall Cognitive Status: History of cognitive impairments - at baseline Area of Impairment: Orientation;Memory;[place] level                 Orientation Level: Disoriented to;Time   Memory: Decreased short-term memory   Safety/Judgement: Decreased awareness of safety;Decreased awareness of deficits   Problem Solving: Slow processing;Difficulty sequencing        Exercises General Exercises - Upper Extremity Shoulder Flexion: AAROM;Both;5 reps;Seated Other Exercises Other Exercises: sit<>stand x 5; seated scap squeezes x 5 max cues and demonstration    General Comments        Pertinent Vitals/Pain  Pain Assessment: Faces Faces Pain Scale: Hurts little more Pain Location: L shoulder Pain Descriptors / Indicators: Aching;Other (Comment) (bruised) Pain Intervention(s): Repositioned;Monitored during session    Home Living                      Prior Function            PT Goals (current goals can now be found in the care plan section) Progress towards PT goals:  Progressing toward goals    Frequency    Min 3X/week      PT Plan Current plan remains appropriate    Co-evaluation              AM-PAC PT "6 Clicks" Mobility   Outcome Measure  Help needed turning from your back to your side while in a flat bed without using bedrails?: None Help needed moving from lying on your back to sitting on the side of a flat bed without using bedrails?: None Help needed moving to and from a bed to a chair (including a wheelchair)?: A Little Help needed standing up from a chair using your arms (e.g., wheelchair or bedside chair)?: A Little Help needed to walk in hospital room?: A Little Help needed climbing 3-5 steps with a railing? : A Little 6 Click Score: 20    End of Session   Activity Tolerance: Patient tolerated treatment well Patient left: in bed;with call bell/phone within reach;with bed alarm set Nurse Communication: Mobility status PT Visit Diagnosis: Unsteadiness on feet (R26.81);Other abnormalities of gait and mobility (R26.89);History of falling (Z91.81);Muscle weakness (generalized) (M62.81)     Time: 9528-4132 PT Time Calculation (min) (ACUTE ONLY): 33 min  Charges:  $Gait Training: 8-22 mins $Therapeutic Activity: 8-22 mins                     Magda Kiel, PT Acute Rehabilitation Services Pager:[phone] Office:[phone] 01/10/2020    Reginia Naas 01/10/2020, 5:24 PM

## 2020-01-11 DIAGNOSIS — Z20822 Contact with and (suspected) exposure to covid-19: Secondary | ICD-10-CM | POA: Diagnosis not present

## 2020-01-11 DIAGNOSIS — R4189 Other symptoms and signs involving cognitive functions and awareness: Secondary | ICD-10-CM | POA: Diagnosis not present

## 2020-01-11 DIAGNOSIS — I259 Chronic ischemic heart disease, unspecified: Secondary | ICD-10-CM | POA: Diagnosis not present

## 2020-01-11 DIAGNOSIS — Z03818 Encounter for observation for suspected exposure to other biological agents ruled out: Secondary | ICD-10-CM | POA: Diagnosis not present

## 2020-01-11 DIAGNOSIS — J449 Chronic obstructive pulmonary disease, unspecified: Secondary | ICD-10-CM | POA: Diagnosis not present

## 2020-01-11 DIAGNOSIS — F0390 Unspecified dementia without behavioral disturbance: Secondary | ICD-10-CM | POA: Diagnosis not present

## 2020-01-11 DIAGNOSIS — W19XXXA Unspecified fall, initial encounter: Secondary | ICD-10-CM | POA: Diagnosis not present

## 2020-01-11 DIAGNOSIS — I2721 Secondary pulmonary arterial hypertension: Secondary | ICD-10-CM | POA: Diagnosis not present

## 2020-01-11 DIAGNOSIS — M25512 Pain in left shoulder: Secondary | ICD-10-CM | POA: Diagnosis not present

## 2020-01-11 DIAGNOSIS — Z743 Need for continuous supervision: Secondary | ICD-10-CM | POA: Diagnosis not present

## 2020-01-11 DIAGNOSIS — I5033 Acute on chronic diastolic (congestive) heart failure: Secondary | ICD-10-CM | POA: Diagnosis not present

## 2020-01-11 DIAGNOSIS — R410 Disorientation, unspecified: Secondary | ICD-10-CM | POA: Diagnosis not present

## 2020-01-11 DIAGNOSIS — R279 Unspecified lack of coordination: Secondary | ICD-10-CM | POA: Diagnosis not present

## 2020-01-11 DIAGNOSIS — R41841 Cognitive communication deficit: Secondary | ICD-10-CM | POA: Diagnosis not present

## 2020-01-11 DIAGNOSIS — I4891 Unspecified atrial fibrillation: Secondary | ICD-10-CM

## 2020-01-11 DIAGNOSIS — R296 Repeated falls: Secondary | ICD-10-CM | POA: Diagnosis not present

## 2020-01-11 DIAGNOSIS — R5381 Other malaise: Secondary | ICD-10-CM | POA: Diagnosis not present

## 2020-01-11 DIAGNOSIS — R278 Other lack of coordination: Secondary | ICD-10-CM | POA: Diagnosis not present

## 2020-01-11 DIAGNOSIS — I499 Cardiac arrhythmia, unspecified: Secondary | ICD-10-CM | POA: Diagnosis not present

## 2020-01-11 DIAGNOSIS — M6282 Rhabdomyolysis: Secondary | ICD-10-CM | POA: Diagnosis not present

## 2020-01-11 DIAGNOSIS — R2689 Other abnormalities of gait and mobility: Secondary | ICD-10-CM | POA: Diagnosis not present

## 2020-01-11 DIAGNOSIS — M6281 Muscle weakness (generalized): Secondary | ICD-10-CM | POA: Diagnosis not present

## 2020-01-11 DIAGNOSIS — E119 Type 2 diabetes mellitus without complications: Secondary | ICD-10-CM | POA: Diagnosis not present

## 2020-01-11 DIAGNOSIS — E1140 Type 2 diabetes mellitus with diabetic neuropathy, unspecified: Secondary | ICD-10-CM | POA: Diagnosis not present

## 2020-01-11 DIAGNOSIS — R2681 Unsteadiness on feet: Secondary | ICD-10-CM | POA: Diagnosis not present

## 2020-01-11 LAB — GLUCOSE, CAPILLARY
Glucose-Capillary: 148 mg/dL — ABNORMAL HIGH (ref 70–99)
Glucose-Capillary: 177 mg/dL — ABNORMAL HIGH (ref 70–99)

## 2020-01-11 MED ORDER — METOPROLOL TARTRATE 37.5 MG PO TABS: 37.5000 mg | ORAL_TABLET | Freq: Two times a day (BID) | ORAL | 0 refills | Status: DC

## 2020-01-11 MED ORDER — DONEPEZIL HCL 5 MG PO TABS: 5.0000 mg | ORAL_TABLET | Freq: Every day | ORAL | 0 refills | Status: DC

## 2020-01-11 MED ORDER — LIDOCAINE 5 % EX PTCH
1.0000 | MEDICATED_PATCH | CUTANEOUS | Status: DC
  Administered 2020-01-11: 1 via TRANSDERMAL
  Filled 2020-01-11: qty 1

## 2020-01-11 MED ORDER — WARFARIN SODIUM 5 MG PO TABS: 5.0000 mg | ORAL_TABLET | Freq: Every day | ORAL | 0 refills | Status: DC

## 2020-01-11 MED ORDER — METOPROLOL SUCCINATE ER 25 MG PO TB24: 75.0000 mg | ORAL_TABLET | Freq: Every day | ORAL | 3 refills | Status: DC

## 2020-01-11 MED ORDER — POTASSIUM CHLORIDE CRYS ER 20 MEQ PO TBCR: EXTENDED_RELEASE_TABLET | ORAL | 3 refills | Status: DC

## 2020-01-11 MED ORDER — DONEPEZIL HCL 5 MG PO TABS: 5.0000 mg | ORAL_TABLET | Freq: Every day | ORAL | Status: DC

## 2020-01-11 MED ORDER — FUROSEMIDE 40 MG PO TABS: ORAL_TABLET | ORAL | 3 refills | Status: DC

## 2020-01-11 NOTE — Consult Note (Signed)
   [place] [facility] Inpatient Consult   01/11/2020  [facility] 1940/12/12 [id number]   [place] [facility] [ACO] Patient: [employer]  Chart review and patient for [facility], patient with history of [facility] Care Management.  Patient is also a member in the [place] practice.  Needs to be met at the skilled nursing level of care.  Sign off.  Natividad Brood, RN BSN [place] [facility] Liaison  [phone] business mobile phone Toll free office [phone]  Fax number: [phone] [email or web address]

## 2020-01-11 NOTE — Discharge Summary (Addendum)
[facility] Discharge Summary  Patient name: Matthew Edwards Medical record number: [id number] Date of birth: 12-20-1940 Age: 79 y.o. Gender: male Date of Admission: 01/02/2020  Date of Discharge: 01/11/20 Admitting Physician: Martyn Malay, MD  Primary Care Provider: Lind Covert, MD Consultants: None  Indication for Hospitalization: Atrial Fibrillation with RVR  Discharge Diagnoses/Problem List:  Atrial Fibrillation with RVR Deconditioning Early Onset Dementia T2DM CAD COPD  Disposition: SNF- [place]  Discharge Condition: Stable  Discharge Exam:  Temp:  [97.5 F (36.4 C)-99.7 F (37.6 C)] 98.8 F (37.1 C) (08/12 0421) Pulse Rate:  [11-111] 105 (08/12 0421) Resp:  [17-23] 20 (08/12 0421) BP: (103-118)/(66-96) 117/96 (08/12 0421) SpO2:  [93 %-98 %] 96 % (08/12 0421) Physical Exam: General: alert to place, time and situation. No acute distress Cardiovascular: irregularly irregular, rate low 100's Respiratory: speaking in full sentences, no increased work of breathing, no wheezing, rhonchi appreciated.  Abdomen: soft, non-tender in all quadrants Extremities: no edema, 2+ DP b/l  Brief Hospital Course:  Matthew Edwards is a 79 y.o. male who presented after a fall. PMH is significant for HTN, T2DM with neuropathy, primary hyperparathyroidism, HLD, hypercalcemia, alcohol abuse, aortic valve replacement, atrial fibrillation on Coumadin, OSA, COPD. Below is his hospital course listed by problem. Refer to H&P for additional information.  Concern for Dementia  Fall with elevated CK and left shoulder pain  Unsteady Gait  Patient presented following an  unwitnessed fall. Concern for overall deconditioning. He lives alone and appears to have been down for several hours prior to daughter finding patient. CK was elevated 934 and decreased after IV fluids were given to 347. Did not trend CK further. CXR in ED significant for possible right  posterior rib fracture. Patient also found to be in atrial fibrillation with RVR in ED (more details below). Patient disoriented in the ED, actively pulling out IV. Received 5mg  Haldol x1.  1:1 safety sitter for the first day but later discontinued due to improved orientation and patient redirectable. Patient complained of left shoulder pain as well from fall but x-ray negative for fracture or dislocation. Did have left upper back wound, bacitracin and dressing applied to area. Wound monitored. Received Seroquel 50 mg x1 for additional agitation with relief. Orientation waxed and waned, some days patient oriented to time, place and situation, other days he was not. Delirium precautions were placed. Reversible causes for dementia were assessed and negative including HIV, Syphilis, Thyroid, B12. Was started on Aricept 5 mg.   Atrial Fibrillation with RVR A. Fib with RVR in ED. Home medications: Metoprolol succinate 25mg  given without improvement.  Oral dose of diltiazem without relief so diltiazem bolus and drip were given. Patient still had elevated HR and required additional dose of metoprolol tartrate until he had better HR control with metoprolol tartrate 37.5mg  BID. He was titrated off diltiazem drip and discharged on metoprolol succinate 75 mg daily. Patient with one 20-run V-Tach 8/7 with HR to 160's that was resolved with metoprolol adjustments. Echo ordered, EF 60-65%, moderate mitral stenosis, moderate PAH. Referral to cardiology placed at time of d/c.    HTN, chronic, stable Patient with previously diagnosed HTN. Home medications included Benazepril 20 mg daily, lasix 40mg  BID. Home medications held throughout admission due to low blood pressures. Was continued on 37.5 mg Metoprolol Tartrate. Strict I/O daily, net output  was -2.624L on discharge.   Due to recurrent falls and persistent weakness, the patient was evaluated by Physical and  occupational therapy with recommendation for skilled nursing  facility.   Discharge Diet: Carb modified and heart healthy.  Issues for Follow Up:  1. Elevated indirect bili levels on admission to 1.8. Recommend CMP to assess this. Consider RUQ ultrasound. Suspect this could be 2/2 to mild hemolytic anemia from mechanical valve. 2. Referral placed for cardiology for f/u: mitral valve stenosis, moderate PAH on echo- likely due to OSA. Please confirm that patient has follow up for this. Patient on Metoprolol Succinate 75mg  daily. Please adjust as necessary.   3. Follow up left-upper back wound. Bacitracin and dressings as needed. Lidocaine patch for pain. 4. PRN Lasix if 3lb increase in one day of 5 or greater lb increase in a week. Potassium supplement on days that Lasix is given. 5. Please have patient take Metoprolol Tartrate 37.5mg  on the evening of August 12th. He may start Metprolol Succinate 75mg  on August 13th.   Significant Procedures: None  Significant Labs and Imaging:  Recent Labs  Lab 01/05/20 0431 01/07/20 0339 01/10/20 0452  WBC 6.1 4.8 6.7  HGB 12.1* 12.4* 11.8*  HCT 36.8* 38.7* 35.8*  PLT 143* 143* 175   Recent Labs  Lab 01/05/20 0431 01/05/20 0431 01/05/20 2139 01/06/20 2212  NA 137  --  136 135  K 4.3   < > 4.1 4.3  CL 102  --  102 103  CO2 27  --  26 23  GLUCOSE 131*  --  143* 118*  BUN 10  --  11 12  CREATININE 1.10  --  1.06 0.93  CALCIUM 10.1  --  10.4* 10.4*  MG  --   --   --  1.8  PHOS  --   --   --  2.6  ALKPHOS 60  --   --   --   AST 32  --   --   --   ALT 19  --   --   --   ALBUMIN 3.2*  --   --   --    < > = values in this interval not displayed.   CT Head 8/3 IMPRESSION: 1. No acute intracranial pathology. 2. Mild age-related atrophy and chronic microvascular ischemic changes.  DG Chest 1-View 8/4 IMPRESSION: Probable posterior right seventh rib fracture. No pneumothorax.  DG Shoulder- Left 8/5 IMPRESSION: No acute osseous abnormality.  Echo 8/8 IMPRESSION:  1. Left ventricular ejection  fraction, by estimation, is 60 to 65%. The  left ventricle has normal function. Left ventricular endocardial border  not optimally defined to evaluate regional wall motion. There is mild  asymmetric left ventricular hypertrophy of the septal segment. Left ventricular diastolic parameters are indeterminate.   2. Right ventricular systolic function is moderately reduced. The right  ventricular size is normal. There is moderately elevated pulmonary artery  systolic pressure. The estimated right ventricular systolic pressure is  62.9 mmHg.  3. Left atrial size was severely dilated.  4. Right atrial size was severely dilated.  5. The mitral valve is degenerative. Mild mitral valve regurgitation. Moderate mitral stenosis. The mean mitral valve gradient is 7.0 mmHg with average heart rate of 97 bpm.  6. Tricuspid valve regurgitation is moderate to severe.  7. The aortic valve has been repaired/replaced. Aortic valve regurgitation is not visualized. There is a 25 mm Edwards pericardial valve present in the aortic position. Procedure Date: 07/28/2010. Aortic valve mean gradient measures 2.7 mmHg.  8. The inferior vena cava is dilated in size with >50% respiratory  variability,  suggesting right atrial pressure of 8 mmHg.   Results/Tests Pending at Time of Discharge: None  Discharge Medications:  Allergies as of 01/11/2020      Reactions   Pineapple Concentrate Nausea And Vomiting      Medication List    STOP taking these medications   benazepril 20 MG tablet Commonly known as: LOTENSIN     TAKE these medications   atorvastatin 40 MG tablet Commonly known as: LIPITOR Take 1 tablet (40 mg total) by mouth daily at 6 PM. TAKE 1 TABLET DAILY AT 6 PM What changed: additional instructions   budesonide-formoterol 80-4.5 MCG/ACT inhaler Commonly known as: Symbicort Inhale 2 puffs into the lungs 2 (two) times daily.   donepezil 5 MG tablet Commonly known as: ARICEPT Take 1 tablet (5 mg total) by  mouth at bedtime.   furosemide 40 MG tablet Commonly known as: LASIX PRN, take one tablet if 3lb weight gain in one day or 5lb or greater weight gain in one week. What changed:   how much to take  how to take this  when to take this  additional instructions   glucose blood test strip USE TO CHECK BLOOD SUGARS FOUR TIMES DAILY   metoprolol succinate 25 MG 24 hr tablet Commonly known as: TOPROL-XL Take 3 tablets (75 mg total) by mouth daily. Start taking on: January 12, 2020 What changed: how much to take   Metoprolol Tartrate 37.5 MG Tabs Take 37.5 mg by mouth 2 (two) times daily. Take 37.5 mg the evening of August 12th. Start Metoprolol SUCCINATE on August 13th   multivitamin with minerals tablet Take 1 tablet by mouth every evening.   nitroGLYCERIN 0.4 MG SL tablet Commonly known as: NITROSTAT Place 1 tablet (0.4 mg total) under the tongue every 5 (five) minutes as needed. For chest pain   potassium chloride SA 20 MEQ tablet Commonly known as: KLOR-CON Taken by mouth only on days lasix is taken What changed:   how much to take  how to take this  when to take this  additional instructions   warfarin 5 MG tablet Commonly known as: COUMADIN Take 1 tablet (5 mg total) by mouth daily at 4 PM. What changed:   how much to take  how to take this  when to take this  additional instructions       Discharge Instructions: Please refer to Patient Instructions section of EMR for full details.  Patient was counseled important signs and symptoms that should prompt return to medical care, changes in medications, dietary instructions, activity restrictions, and follow up appointments.    Sharion Settler, DO 12:34PM 01/11/20 PGY-1, [place]

## 2020-01-11 NOTE — Progress Notes (Addendum)
Family Medicine Teaching Service Daily Progress Note Intern Pager: [phone]  Patient name: Matthew Edwards Medical record number: [id number] Date of birth: [birth year] Age: 79 y.o. Gender: male  Primary Care Provider: Lind Covert, MD Consultants: None Code Status: FULL  Pt Overview and Major Events to Date:  Admitted 8/4  Assessment and Plan: Tramond Slinker Cookis a 79 y.o.malepresenting s/p falland found to be in A. fib with RVR. PMH is significant forHTN, T2DM with neuropathy, primary hyperparathyroidism, HLD, hypercalcemia, alcohol abuse, aortic valve replacement, atrial fibrillationon Coumadin, OSA, COPD.  Atrial Fibrillation with RVR: stable, improved HR90-105 overnight.In room, patient with irregularly irregular rhythm with HR to low 100's on telemetrythis AM in room.  - D/c cardiac monitoring, has been stable - Continue Metoprolol tartrate 37.5mg  BID - Echo: EF 60-65%, mitral stenosis and PAH. Will d/c with referral to cardiology.    PossibleDementia   Unsteady Gait  s/p fall Continue to await placement to SNF. Labs for reversible causes of dementia orderedand negative including HIV, RPR, TSH, B12. - Delirium precautions  - Avoid delirium-genic medications - Fall precautions - Out of bed with assistance - Bacitracin and dressing toright upper backwound, will continue to monitor. - Lidocaine patch - Start Aricept 5 mg - OOB with assistance for meals  History of Hypertension: stable, chronic AMBP:117/96.Normotensive overnight.Currently on Metorpolol tartrate 37.5 BID. Home medications:benazepril 20 mg daily, lasix 40mg  BID - Continue to hold home medications due tosoft pressures - Add medications back as needed - Strict I/O - Vitals q8h - Continue Metoprolol 37.5 mg BID  Type 2 Diabetes Mellitus: chronic, stable Not on any home medications. CBG's:148this AM, given 1 unit of insulin aspart. - sensitive SSI; has not required much and is  not on medication at home. Will d/c - Monitor CBG's daily, stable, will d/c - Continue carb modified diet  Hx of Aortic Valve Repalcement   CAD: chronic, stable. Patient with history of cardiac catheterization 06/2010, coronary artery bypass graft and aortic valve replacement 07/28/2010, coronary angioplasty with stent placement 10/01/2010.At home patient on atorvastatin 40mg  qd, nitroglycerin PRN, metoprolol succinate 25mg  daily, benazepril 20mg  daily, warfarin 10mg  Thursdays and 7.5mg  all other days.INR 2.6 yesterday. - Continue homeAtorvastatin - On metoprolol tartrate37.5BID currently - Hold home Benazepril d/t low blood pressures, restart as needed - INR, Warfarin dosed by pharmacy  Hx HLD: chronic, stable, improved Home medication notable for atorvastatin 40mg . - Continue Atorvastatin40mg   COPD Patient on Symbicort at home.  -Breo Ellipta on formulary. 1 puff daily  FEN/GI:Carb modified diet UXN:ATFTDDUK dosed per pharmacy  Disposition: Medically stable. Awaiting SNF placement.  Subjective:  Patient feels well today. States he is having "spurts" of bowel movements. No abdominal pain. Feels as though his condom catheter may be "tight". No chest pain, shortness of breath. Still having left upper back pain.   Objective: Temp:  [97.5 F (36.4 C)-99.7 F (37.6 C)] 98.8 F (37.1 C) (08/12 0421) Pulse Rate:  [11-111] 105 (08/12 0421) Resp:  [17-23] 20 (08/12 0421) BP: (103-118)/(66-96) 117/96 (08/12 0421) SpO2:  [93 %-98 %] 96 % (08/12 0421) Physical Exam: General: alert to place, time and situation. No acute distress Cardiovascular: irregularly irregular, rate low 100's Respiratory: speaking in full sentences, no increased work of breathing, no wheezing, rhonchi appreciated.  Abdomen: soft, non-tender in all quadrants Extremities: no edema, 2+ DP b/l  Laboratory: Recent Labs  Lab 01/05/20 0431 01/07/20 0339 01/10/20 0452  WBC 6.1 4.8 6.7  HGB 12.1* 12.4*  11.8*  HCT 36.8* 38.7*  35.8*  PLT 143* 143* 175   Recent Labs  Lab 01/05/20 0431 01/05/20 2139 01/06/20 2212  NA 137 136 135  K 4.3 4.1 4.3  CL 102 102 103  CO2 27 26 23   BUN 10 11 12   CREATININE 1.10 1.06 0.93  CALCIUM 10.1 10.4* 10.4*  PROT 5.9*  --   --   BILITOT 1.4*  --   --   ALKPHOS 60  --   --   ALT 19  --   --   AST 32  --   --   GLUCOSE 131* 143* 118*    Imaging/Diagnostic Tests: None new.  Sharion Settler, DO 01/11/2020, 6:16 AM PGY-1, [place] Intern pager: [phone], text pages welcome

## 2020-01-11 NOTE — TOC Transition Note (Signed)
Transition of Care [facility]) - CM/SW Discharge Note   Patient Details  Name: Matthew Edwards MRN: [id number] Date of Birth: 1941-03-31  [facility] CM/SW Contact:  Vinie Sill, [place] Phone Number: 01/11/2020, 1:41 PM   Clinical Narrative:     Patient will DC to: [place] Date: 01/11/2020 Family Notified: [name],daughter Transport By: Corey Harold @ 2:30pm   Per MD patient is ready for discharge. RN, patient, and facility notified of DC. Discharge Summary sent to facility. RN given number for [phone]. Ambulance transport requested for patient.   Clinical Social Worker signing off.  Thurmond Butts, MSW, [place] Clinical Social Worker    Final next level of care: Skilled Nursing Facility Barriers to Discharge: Barriers Resolved   Patient Goals and CMS Choice        Discharge Placement              Patient chooses bed at: [facility] Patient to be transferred to facility by: [facility] Name of family member notified: [name],daughter Patient and family notified of of transfer: 01/11/20  Discharge Plan and Services In-house Referral: Clinical Social Work                                   Social Determinants of Health (SDOH) Interventions     Readmission Risk Interventions No flowsheet data found.

## 2020-01-11 NOTE — Progress Notes (Signed)
D/c tele and IV. Called report to Estill Bamberg, Therapist, sports at [place] facility. Packed pt's belongings. Put documentation in pt's discharged package.   Lavenia Atlas, RN

## 2020-01-11 NOTE — Discharge Instructions (Signed)
You were hospitalized at [facility] after a fall and for increased weakness. You were also found to have atrial fibrillation with a rapid ventricular rate.  What this means is your heart was beating very fast in an irregular rhythm. We started you on medications to help control the rate of your heart, called Metorpolol Tartrate. This is slightly different than your home medication, Metoprolol Succinate. We also performed an ultrasound of your heart which showed evidence of stiffness in one of the valves and some floppiness in one of the other valves. There was also high blood pressures noted in the arteries that go to your lungs. We will send a referral to see cardiology for further management of this. We have worked with case management to help get you placemed in a [place] where they can help work on increasing your overall strength. We also suspect you may have some dementia and started you on a medication called Aricept We are glad you are doing better and hope that trend will continue at the facility. Thank you for allowing [place] to take care of you.  Please have patient take Metoprolol Tartrate 37.5mg  on the evening of August 12th. He may start Metprolol Succinate 75mg  on August 13th.   Take care, [facility] family medicine team    Atrial Fibrillation  Atrial fibrillation is a type of heartbeat that is irregular or fast. If you have this condition, your heart beats without any order. This makes it hard for your heart to pump blood in a normal way. Atrial fibrillation may come and go, or it may become a long-lasting problem. If this condition is not treated, it can put you at higher risk for stroke, heart failure, and other heart problems. What are the causes? This condition may be caused by diseases that damage the heart. They include:  High blood pressure.  Heart failure.  Heart valve disease.  Heart surgery. Other causes include:  Diabetes.  Thyroid  disease.  Being overweight.  Kidney disease. Sometimes the cause is not known. What increases the risk? You are more likely to develop this condition if:  You are older.  You smoke.  You exercise often and very hard.  You have a family history of this condition.  You are a man.  You use drugs.  You drink a lot of alcohol.  You have lung conditions, such as emphysema, pneumonia, or COPD.  You have sleep apnea. What are the signs or symptoms? Common symptoms of this condition include:  A feeling that your heart is beating very fast.  Chest pain or discomfort.  Feeling short of breath.  Suddenly feeling light-headed or weak.  Getting tired easily during activity.  Fainting.  Sweating. In some cases, there are no symptoms. How is this treated? Treatment for this condition depends on underlying conditions and how you feel when you have atrial fibrillation. They include:  Medicines to: ? Prevent blood clots. ? Treat heart rate or heart rhythm problems.  Using devices, such as a pacemaker, to correct heart rhythm problems.  Doing surgery to remove the part of the heart that sends bad signals.  Closing an area where clots can form in the heart (left atrial appendage). In some cases, your doctor will treat other underlying conditions. Follow these instructions at home: Medicines  Take over-the-counter and prescription medicines only as told by your doctor.  Do not take any new medicines without first talking to your doctor.  If you are taking blood thinners: ?  Talk with your doctor before you take any medicines that have aspirin or NSAIDs, such as ibuprofen, in them. ? Take your medicine exactly as told by your doctor. Take it at the same time each day. ? Avoid activities that could hurt or bruise you. Follow instructions about how to prevent falls. ? Wear a bracelet that says you are taking blood thinners. Or, carry a card that lists what medicines you  take. Lifestyle      Do not use any products that have nicotine or tobacco in them. These include cigarettes, e-cigarettes, and chewing tobacco. If you need help quitting, ask your doctor.  Eat heart-healthy foods. Talk with your doctor about the right eating plan for you.  Exercise regularly as told by your doctor.  Do not drink alcohol.  Lose weight if you are overweight.  Do not use drugs, including cannabis. General instructions  If you have a condition that causes breathing to stop for a short period of time (apnea), treat it as told by your doctor.  Keep a healthy weight. Do not use diet pills unless your doctor says they are safe for you. Diet pills may make heart problems worse.  Keep all follow-up visits as told by your doctor. This is important. Contact a doctor if:  You notice a change in the speed, rhythm, or strength of your heartbeat.  You are taking a blood-thinning medicine and you get more bruising.  You get tired more easily when you move or exercise.  You have a sudden change in weight. Get help right away if:   You have pain in your chest or your belly (abdomen).  You have trouble breathing.  You have side effects of blood thinners, such as blood in your vomit, poop (stool), or pee (urine), or bleeding that cannot stop.  You have any signs of a stroke. "BE FAST" is an easy way to remember the main warning signs: ? B - Balance. Signs are dizziness, sudden trouble walking, or loss of balance. ? E - Eyes. Signs are trouble seeing or a change in how you see. ? F - Face. Signs are sudden weakness or loss of feeling in the face, or the face or eyelid drooping on one side. ? A - Arms. Signs are weakness or loss of feeling in an arm. This happens suddenly and usually on one side of the body. ? S - Speech. Signs are sudden trouble speaking, slurred speech, or trouble understanding what people say. ? T - Time. Time to call emergency services. Write down what  time symptoms started.  You have other signs of a stroke, such as: ? A sudden, very bad headache with no known cause. ? Feeling like you may vomit (nausea). ? Vomiting. ? A seizure. These symptoms may be an emergency. Do not wait to see if the symptoms will go away. Get medical help right away. Call your local emergency services (911 in the [place] Do not drive yourself to the hospital. Summary  Atrial fibrillation is a type of heartbeat that is irregular or fast.  You are at higher risk of this condition if you smoke, are older, have diabetes, or are overweight.  Follow your doctor's instructions about medicines, diet, exercise, and follow-up visits.  Get help right away if you have signs or symptoms of a stroke.  Get help right away if you cannot catch your breath, or you have chest pain or discomfort. This information is not intended to replace advice given to you  by your health care provider. Make sure you discuss any questions you have with your health care provider. Document Revised: 11/09/2018 Document Reviewed: 11/09/2018 Elsevier Patient Education  [place].

## 2020-01-12 DIAGNOSIS — F0390 Unspecified dementia without behavioral disturbance: Secondary | ICD-10-CM | POA: Diagnosis not present

## 2020-01-12 DIAGNOSIS — R5381 Other malaise: Secondary | ICD-10-CM | POA: Diagnosis not present

## 2020-01-12 DIAGNOSIS — E119 Type 2 diabetes mellitus without complications: Secondary | ICD-10-CM | POA: Diagnosis not present

## 2020-01-12 DIAGNOSIS — I4891 Unspecified atrial fibrillation: Secondary | ICD-10-CM | POA: Diagnosis not present

## 2020-01-15 DIAGNOSIS — I4891 Unspecified atrial fibrillation: Secondary | ICD-10-CM | POA: Diagnosis not present

## 2020-01-15 DIAGNOSIS — R5381 Other malaise: Secondary | ICD-10-CM | POA: Diagnosis not present

## 2020-01-15 DIAGNOSIS — F0390 Unspecified dementia without behavioral disturbance: Secondary | ICD-10-CM | POA: Diagnosis not present

## 2020-01-15 DIAGNOSIS — I2721 Secondary pulmonary arterial hypertension: Secondary | ICD-10-CM | POA: Diagnosis not present

## 2020-01-17 DIAGNOSIS — E1140 Type 2 diabetes mellitus with diabetic neuropathy, unspecified: Secondary | ICD-10-CM | POA: Diagnosis not present

## 2020-01-17 DIAGNOSIS — I259 Chronic ischemic heart disease, unspecified: Secondary | ICD-10-CM | POA: Diagnosis not present

## 2020-02-02 ENCOUNTER — Encounter: Payer: Self-pay | Admitting: Family Medicine

## 2020-02-02 ENCOUNTER — Ambulatory Visit (INDEPENDENT_AMBULATORY_CARE_PROVIDER_SITE_OTHER): Payer: Medicare HMO | Admitting: Family Medicine

## 2020-02-02 ENCOUNTER — Other Ambulatory Visit: Payer: Self-pay

## 2020-02-02 VITALS — BP 100/62 | HR 95 | Wt 216.8 lb

## 2020-02-02 DIAGNOSIS — I4891 Unspecified atrial fibrillation: Secondary | ICD-10-CM

## 2020-02-02 DIAGNOSIS — E1140 Type 2 diabetes mellitus with diabetic neuropathy, unspecified: Secondary | ICD-10-CM

## 2020-02-02 DIAGNOSIS — I5033 Acute on chronic diastolic (congestive) heart failure: Secondary | ICD-10-CM | POA: Diagnosis not present

## 2020-02-02 DIAGNOSIS — I10 Essential (primary) hypertension: Secondary | ICD-10-CM

## 2020-02-02 DIAGNOSIS — I359 Nonrheumatic aortic valve disorder, unspecified: Secondary | ICD-10-CM | POA: Diagnosis not present

## 2020-02-02 LAB — POCT INR: INR: 2.4 (ref 2.0–3.0)

## 2020-02-02 NOTE — Progress Notes (Signed)
    SUBJECTIVE:   CHIEF COMPLAINT / HPI:   Mr. Conly is a 79 yo male who presents with his daughter   Hospital follow up Recently discharged 01/11/20 to a rehab facility from hospital due a fall. He has been back home now for 3-4 days with his 2 daughters caring for him. Daughter thinks he is doing ok. She is attempting to manage his medications as well as her own. She has not given him lasix since being in the facility a few days ago because this was making him urinate a lot. She endorses compliance with metoprolol but had some confusion as far as dosing. She would have preferred he stay there longer but the patient wanted to leave. He felt as if he is more weak since going to the facility. He does not have any concerns today.   PERTINENT  PMH / PSH: Concern for dementia (Started aricept while hospitalized), Afib w/ RVR (Metoporolol 75 mg daily; cardiology appt 02/13/20)  OBJECTIVE:   BP 100/62   Pulse 95   Wt 216 lb 12.8 oz (98.3 kg)   SpO2 93%   BMI 32.02 kg/m   General: Appears well, no acute distress. Age appropriate. Daughter present in room and attentive. Cardiac: RRR, normal heart sounds, no murmurs Respiratory: CTAB, normal effort Extremities: BLE 2+ pitting edema or cyanosis  ASSESSMENT/PLAN:   Atrial fibrillation with RVR (HCC) Stable. Regular rate today.  -Continue metoprolol 75 mg daily -Follow up with cardiology 02/13/20  Essential hypertension BP well controlled with metoprolol. Has LE edema today. Recently stopped lasix due to polyuria. Discussed with daughter would like for him to take a dose today. Explained dosing instructions. She voice understanding. Will consider changing dosing instructions because they require close monitoring and patient to weigh himself. This may be more of a burden for family taking care of him to remember. Will obtain BMP today.  -Lasix 40 mg PRN, take one tablet if 3lb weight gain in one day or 5lb or greater weight gain in one week. Take  40mg  today. -BMP -Follow up in 2-3 weeks and bring ALL medications.   Gerlene Fee, [place]

## 2020-02-02 NOTE — Patient Instructions (Addendum)
It was nice to meet you.  Today you were seen for hospital follow-up.  We addressed medications.  Please continue to take metoprolol 75 mg daily for your heart rate control.  Also take a dose of Lasix today as your weight has increased and you have lower extremity swelling.  We are checking labs today.  If anything is abnormal I will give you a call otherwise I will communicate via MyChart.  Please keep your GI and cardiology appointment.  Follow-up in 2 to 3 weeks and bring ALL of your medications.  Dr. Janus Molder

## 2020-02-03 LAB — BASIC METABOLIC PANEL
BUN/Creatinine Ratio: 18 (ref 10–24)
BUN: 15 mg/dL (ref 8–27)
CO2: 29 mmol/L (ref 20–29)
Calcium: 9.8 mg/dL (ref 8.6–10.2)
Chloride: 101 mmol/L (ref 96–106)
Creatinine, Ser: 0.83 mg/dL (ref 0.76–1.27)
GFR calc Af Amer: 99 mL/min/{1.73_m2} (ref >59)
GFR calc non Af Amer: 85 mL/min/{1.73_m2} (ref >59)
Glucose: 122 mg/dL — ABNORMAL HIGH (ref 65–99)
Potassium: 3.8 mmol/L (ref 3.5–5.2)
Sodium: 143 mmol/L (ref 134–144)

## 2020-02-06 ENCOUNTER — Ambulatory Visit: Payer: Medicare HMO | Admitting: Gastroenterology

## 2020-02-07 DIAGNOSIS — I2721 Secondary pulmonary arterial hypertension: Secondary | ICD-10-CM | POA: Diagnosis not present

## 2020-02-07 DIAGNOSIS — E1140 Type 2 diabetes mellitus with diabetic neuropathy, unspecified: Secondary | ICD-10-CM | POA: Diagnosis not present

## 2020-02-07 DIAGNOSIS — G309 Alzheimer's disease, unspecified: Secondary | ICD-10-CM | POA: Diagnosis not present

## 2020-02-07 DIAGNOSIS — I110 Hypertensive heart disease with heart failure: Secondary | ICD-10-CM | POA: Diagnosis not present

## 2020-02-07 DIAGNOSIS — I2510 Atherosclerotic heart disease of native coronary artery without angina pectoris: Secondary | ICD-10-CM | POA: Diagnosis not present

## 2020-02-07 DIAGNOSIS — F0280 Dementia in other diseases classified elsewhere without behavioral disturbance: Secondary | ICD-10-CM | POA: Diagnosis not present

## 2020-02-07 DIAGNOSIS — J449 Chronic obstructive pulmonary disease, unspecified: Secondary | ICD-10-CM | POA: Diagnosis not present

## 2020-02-07 DIAGNOSIS — I4891 Unspecified atrial fibrillation: Secondary | ICD-10-CM | POA: Diagnosis not present

## 2020-02-07 DIAGNOSIS — I509 Heart failure, unspecified: Secondary | ICD-10-CM | POA: Diagnosis not present

## 2020-02-08 NOTE — Assessment & Plan Note (Signed)
Stable. Regular rate today.  -Continue metoprolol 75 mg daily -Follow up with cardiology 02/13/20

## 2020-02-08 NOTE — Assessment & Plan Note (Addendum)
BP well controlled with metoprolol. Has LE edema today. Recently stopped lasix due to polyuria. Discussed with daughter would like for him to take a dose today. Explained dosing instructions. She voice understanding. Will consider changing dosing instructions because they require close monitoring and patient to weigh himself. This may be more of a burden for family taking care of him to remember. Will obtain BMP today.  -Lasix 40 mg PRN, take one tablet if 3lb weight gain in one day or 5lb or greater weight gain in one week. Take 40mg  today. -BMP -Follow up in 2-3 weeks and bring ALL medications.

## 2020-02-09 DIAGNOSIS — I2510 Atherosclerotic heart disease of native coronary artery without angina pectoris: Secondary | ICD-10-CM | POA: Diagnosis not present

## 2020-02-09 DIAGNOSIS — E1140 Type 2 diabetes mellitus with diabetic neuropathy, unspecified: Secondary | ICD-10-CM | POA: Diagnosis not present

## 2020-02-09 DIAGNOSIS — F0280 Dementia in other diseases classified elsewhere without behavioral disturbance: Secondary | ICD-10-CM | POA: Diagnosis not present

## 2020-02-09 DIAGNOSIS — I509 Heart failure, unspecified: Secondary | ICD-10-CM | POA: Diagnosis not present

## 2020-02-09 DIAGNOSIS — I4891 Unspecified atrial fibrillation: Secondary | ICD-10-CM | POA: Diagnosis not present

## 2020-02-09 DIAGNOSIS — I110 Hypertensive heart disease with heart failure: Secondary | ICD-10-CM | POA: Diagnosis not present

## 2020-02-09 DIAGNOSIS — G309 Alzheimer's disease, unspecified: Secondary | ICD-10-CM | POA: Diagnosis not present

## 2020-02-09 DIAGNOSIS — I2721 Secondary pulmonary arterial hypertension: Secondary | ICD-10-CM | POA: Diagnosis not present

## 2020-02-09 DIAGNOSIS — J449 Chronic obstructive pulmonary disease, unspecified: Secondary | ICD-10-CM | POA: Diagnosis not present

## 2020-02-11 NOTE — Progress Notes (Signed)
Cardiology Office Note    Date:  02/13/2020   ID:  Matthew Edwards, DOB [birth year], MRN [id number]  PCP:  Lind Covert, MD  Cardiologist:  [name] Sleep clinic: Dr. Claiborne Billings   Chief Complaint  Patient presents with  . Atrial Fibrillation    History of Present Illness:  Matthew Edwards is a 79 y.o. male is seen for follow up CAD. Last seen in October 2018. He has a  PMH of CAD s/p stent to LAD and LCx, chronic atrial fibrillation on Coumadin, COPD, GERD, hypertension, hyperlipidemia, DM II, severe OSA and s/p AVR. In 2012, he underwent aortic valve replacement with #46mm Edwards pericardial valve and had LIMA to LAD CABG. Last cardiac catheterization performed in January 2012 prior to CABG and valvular replacement surgery shows patent LAD stent with 40% stenosis proximal to the stent, patent left circumflex stent. He was initially placed on CPAP, this was later transitioned to BiPAP. This  has been managed by Dr. Claiborne Billings.   The patient did undergo surgical incision of a large polyp in Dec 2017. Since then he has refractory fecal incontinence. Underwent colonoscopy with removal of additional 2 polyps last month. Followed by Dr. Loletha Carrow.   Since his last visit with me he has been admitted on a couple of occasions with falls and altered mental status. Most recently admitted in August following a fall. He had Afib with RVR managed initially with diltiazem then transitioned to metoprolol. Echo showed normal EF. There was moderate mitral stenosis with mild MR. AV prosthesis was functioning well. Severe biatrial enlargement. Moderate pulmonary HTN. He was DC to SNF but did not stay long. Returned home with his daughter. She has been giving lasix 40 mg bid for the past 10 days. Weight down 12 lbs since then. No dyspnea or chest pain. INR therapeutic.      Past Medical History:  Diagnosis Date  . Arthritis   . Atrial fibrillation ([place])   . CAD (coronary artery disease) 2012   [place]-LAD 2 or  3 srents  . Complication of anesthesia    "loopy after polpy removal dec 2017, lasted several days"  . COPD (chronic obstructive pulmonary disease) ([place])   . Dementia ([place])   . GERD (gastroesophageal reflux disease)   . HEART FAILURE, CONGESTIVE UNSPEC 02/23/2007   Qualifier: Diagnosis of  By: Mellody Drown MD, [facility]    . Hyperlipidemia   . Hypertension   . OSA (obstructive sleep apnea) 11/13/2014  . S/P AVR    #25 mm Edwards pericardial valve Bioprosthetic. Dr Cyndia Bent.  . Situational depression    "son passed 11/2013"  . Sleep apnea    occ uses c pap does not know settings   . Stented coronary artery 2013  . TOBACCO DEPENDENCE 07/29/2006   Qualifier: History of  By: Carlena Sax  MD, Colletta [place]    . Type II diabetes mellitus ([place])     Past Surgical History:  Procedure Laterality Date  . AORTIC VALVE REPLACEMENT  07/2010   notes 07/28/2010  . CARDIAC CATHETERIZATION  06/2010   Archie Endo 07/01/2010  . COLONOSCOPY WITH PROPOFOL N/A 02/08/2017   Procedure: COLONOSCOPY WITH PROPOFOL;  Surgeon: Doran Stabler, MD;  Location: [facility];  Service: Gastroenterology;  Laterality: N/A;  . CORONARY ANGIOPLASTY WITH STENT PLACEMENT  2006; 10/2006   "2"; 1/notes 10/01/2010  . CORONARY ARTERY BYPASS GRAFT  07/2010   "1"/notes 07/28/2010  . PARTIAL PROCTECTOMY BY TEM N/A 05/26/2016   Procedure: TEM PARTIAL PROCTECTOMY OF  RECTAL MASS;  Surgeon: [name], MD;  Location: [facility];  Service: General;  Laterality: N/A;    Current Medications: Outpatient Medications Prior to Visit  Medication Sig Dispense Refill  . atorvastatin (LIPITOR) 40 MG tablet Take 1 tablet (40 mg total) by mouth daily at 6 PM. TAKE 1 TABLET DAILY AT 6 PM 90 tablet 3  . budesonide-formoterol (SYMBICORT) 80-4.5 MCG/ACT inhaler Inhale 2 puffs into the lungs 2 (two) times daily. 3 g 11  . donepezil (ARICEPT) 5 MG tablet Take 1 tablet (5 mg total) by mouth at bedtime. 30 tablet 0  . furosemide (LASIX) 40 MG tablet PRN, take one tablet if 3lb  weight gain in one day or 5lb or greater weight gain in one week. 180 tablet 3  . glucose blood test strip USE TO CHECK BLOOD SUGARS FOUR TIMES DAILY 400 each 3  . metoprolol succinate (TOPROL-XL) 25 MG 24 hr tablet Take 3 tablets (75 mg total) by mouth daily. 90 tablet 3  . Multiple Vitamins-Minerals (MULTIVITAMIN WITH MINERALS) tablet Take 1 tablet by mouth every evening.    . nitroGLYCERIN (NITROSTAT) 0.4 MG SL tablet Place 1 tablet (0.4 mg total) under the tongue every 5 (five) minutes as needed. For chest pain 25 tablet 6  . potassium chloride SA (KLOR-CON) 20 MEQ tablet Taken by mouth only on days lasix is taken 90 tablet 3  . warfarin (COUMADIN) 5 MG tablet Take 1 tablet (5 mg total) by mouth daily at 4 PM. 30 tablet 0   No facility-administered medications prior to visit.     Allergies:   Pineapple concentrate   Social History   Socioeconomic History  . Marital status: Legally Separated    Spouse name: Not on file  . Number of children: 7  . Years of education: 68  . Highest education level: Not on file  Occupational History  . Occupation: Retired-Post Office manager: RETIRED  . Occupation: [employer]  Tobacco Use  . Smoking status: Former Smoker    Packs/day: 0.50    Years: 46.00    Pack years: 23.00    Types: Cigarettes  . Smokeless tobacco: Never Used  Vaping Use  . Vaping Use: Never used  Substance and Sexual Activity  . Alcohol use: No  . Drug use: No  . Sexual activity: Not on file  Other Topics Concern  . Not on file  Social History Narrative   Health Care POA:    Emergency Contact: Daughter, [name]   End of Life Plan:    Who lives with you: self   Any pets: none   Diet: Patient has a varied diet of protein, starch, and vegetables.   Exercise: Pt has not regular exercise routine.   Seatbelts: Pt reports wearing seatbelt when in vehile.   Nancy Fetter Exposure/Protection:    Hobbies: fishing, tv      Daughter Geoffery Aultman [phone] - main caretaker  currently    [name]    Social Determinants of Health   Financial Resource Strain:   . Difficulty of Paying Living Expenses: Not on file  Food Insecurity:   . Worried About Charity fundraiser in the Last Year: Not on file  . Ran Out of Food in the Last Year: Not on file  Transportation Needs:   . Lack of Transportation (Medical): Not on file  . Lack of Transportation (Non-Medical): Not on file  Physical Activity:   . Days of Exercise per Week: Not on  file  . Minutes of Exercise per Session: Not on file  Stress:   . Feeling of Stress : Not on file  Social Connections:   . Frequency of Communication with Friends and Family: Not on file  . Frequency of Social Gatherings with Friends and Family: Not on file  . Attends Religious Services: Not on file  . Active Member of Clubs or Organizations: Not on file  . Attends Archivist Meetings: Not on file  . Marital Status: Not on file     Family History:  The patient's family history includes Heart disease in his mother.   ROS:   Please see the history of present illness.    ROS All other systems reviewed and are negative.   PHYSICAL EXAM:   VS:  BP 128/75   Pulse 67   Ht 5\' 9"  (1.753 m)   Wt 204 lb 3.2 oz (92.6 kg)   BMI 30.16 kg/m    GENERAL:  Well appearing, obese BM in NAD HEENT:  PERRL, EOMI, sclera are clear. Oropharynx is clear. NECK:  No jugular venous distention, carotid upstroke brisk and symmetric, no bruits, no thyromegaly or adenopathy LUNGS:  Clear to auscultation bilaterally CHEST:  Unremarkable HEART: IRRR,  PMI not displaced or sustained,S1 and S2 within normal limits, no S3, no S4: no clicks, no rubs, no murmurs ABD:  Soft, nontender. BS +, no masses or bruits. No hepatomegaly, no splenomegaly EXT:  2 + pulses throughout, trace edema, no cyanosis no clubbing SKIN:  Warm and dry.  No rashes NEURO:  Alert and oriented x 3. Cranial nerves II through XII intact. PSYCH:  Cognitively intact    Wt  Readings from Last 3 Encounters:  02/13/20 204 lb 3.2 oz (92.6 kg)  02/02/20 216 lb 12.8 oz (98.3 kg)  01/04/20 207 lb 14.3 oz (94.3 kg)      Studies/Labs Reviewed:   EKG:  EKG is not ordered today.    Recent Labs: 11/21/2019: TSH 1.750 01/05/2020: ALT 19 01/06/2020: Magnesium 1.8 01/10/2020: Hemoglobin 11.8; Platelets 175 02/02/2020: BUN 15; Creatinine, Ser 0.83; Potassium 3.8; Sodium 143   Lipid Panel    Component Value Date/Time   CHOL 93 01/04/2020 0642   TRIG 67 01/04/2020 0642   HDL 39 (L) 01/04/2020 0642   CHOLHDL 2.4 01/04/2020 0642   VLDL 13 01/04/2020 0642   LDLCALC 41 01/04/2020 0642   LDLDIRECT 81 12/23/2012 1638    Echo 01/07/20: IMPRESSIONS    1. Left ventricular ejection fraction, by estimation, is 60 to 65%. The  left ventricle has normal function. Left ventricular endocardial border  not optimally defined to evaluate regional wall motion. There is mild  asymmetric left ventricular hypertrophy  of the septal segment. Left ventricular diastolic parameters are  indeterminate.  2. Right ventricular systolic function is moderately reduced. The right  ventricular size is normal. There is moderately elevated pulmonary artery  systolic pressure. The estimated right ventricular systolic pressure is  32.3 mmHg.  3. Left atrial size was severely dilated.  4. Right atrial size was severely dilated.  5. The mitral valve is degenerative. Mild mitral valve regurgitation.  Moderate mitral stenosis. The mean mitral valve gradient is 7.0 mmHg with  average heart rate of 97 bpm.  6. Tricuspid valve regurgitation is moderate to severe.  7. The aortic valve has been repaired/replaced. Aortic valve  regurgitation is not visualized. There is a 25 mm Edwards pericardial  valve present in the aortic position. Procedure Date: 07/28/2010. Aortic  valve mean gradient measures 2.7 mmHg.  8. The inferior vena cava is dilated in size with >50% respiratory  variability,  suggesting right atrial pressure of 8 mmHg.   ASSESSMENT:    No diagnosis found.   PLAN:  In order of problems listed above:  1. Afib chronic. Rate well controlled on metoprolol. On coumadin. INR followed by primary care. Coumadin has been therapeutic. I would favor continued coumadin therapy and not DOAC due to mitral stenosis.   2. CAD s/p CABG: Last cardiac catheterization in 2012, stable anatomy at the time with patent stent in left circumflex and LAD. Underwent LIMA to LAD bypass surgery during valvular surgery. He is asymptomatic.  3. Hypertension: Blood pressure is well controlled today  4. Hyperlipidemia: On Lipitor daily. Labs followed by primary care.  5. DM II: On glipizide   6. S/p aortic valve replacement: Last echo August showed stable anatomy with bioprosthetic aortic valve.   7. Edema- weight is down 12 lbs. Will reduce lasix to 40 mg once a day with extra lasix as needed for weight gain of 2-3 lbs.  Continue Rx and sodium restriction.  8. OSA. Unable to tolerate CPAP  9.   Mitral stenosis. Moderate. Not a surgical candidate.  10. Pulmonary HTN    Medication Adjustments/Labs and Tests Ordered: Current medicines are reviewed at length with the patient today.  Concerns regarding medicines are outlined above.  Medication changes, Labs and Tests ordered today are listed in the Patient Instructions below. Patient Instructions  Continue lasix 40 mg daily. Current weight is 204 lbs. If weight goes up by 2-3 lbs then take extra lasix in the afternoon until weight comes back down.  Continue coumadin.     Signed, Quame Spratlin [place], MD  02/13/2020 3:42 PM    [facility] [address], [place], [place]  [address] Phone: [phone]; Fax: [phone]

## 2020-02-12 DIAGNOSIS — I2510 Atherosclerotic heart disease of native coronary artery without angina pectoris: Secondary | ICD-10-CM | POA: Diagnosis not present

## 2020-02-12 DIAGNOSIS — I509 Heart failure, unspecified: Secondary | ICD-10-CM | POA: Diagnosis not present

## 2020-02-12 DIAGNOSIS — I2721 Secondary pulmonary arterial hypertension: Secondary | ICD-10-CM | POA: Diagnosis not present

## 2020-02-12 DIAGNOSIS — F0280 Dementia in other diseases classified elsewhere without behavioral disturbance: Secondary | ICD-10-CM | POA: Diagnosis not present

## 2020-02-12 DIAGNOSIS — G309 Alzheimer's disease, unspecified: Secondary | ICD-10-CM | POA: Diagnosis not present

## 2020-02-12 DIAGNOSIS — E1140 Type 2 diabetes mellitus with diabetic neuropathy, unspecified: Secondary | ICD-10-CM | POA: Diagnosis not present

## 2020-02-12 DIAGNOSIS — J449 Chronic obstructive pulmonary disease, unspecified: Secondary | ICD-10-CM | POA: Diagnosis not present

## 2020-02-12 DIAGNOSIS — I110 Hypertensive heart disease with heart failure: Secondary | ICD-10-CM | POA: Diagnosis not present

## 2020-02-12 DIAGNOSIS — I4891 Unspecified atrial fibrillation: Secondary | ICD-10-CM | POA: Diagnosis not present

## 2020-02-13 ENCOUNTER — Ambulatory Visit (INDEPENDENT_AMBULATORY_CARE_PROVIDER_SITE_OTHER): Payer: Medicare HMO | Admitting: Cardiology

## 2020-02-13 ENCOUNTER — Encounter: Payer: Self-pay | Admitting: Cardiology

## 2020-02-13 ENCOUNTER — Other Ambulatory Visit: Payer: Self-pay

## 2020-02-13 VITALS — BP 128/75 | HR 67 | Ht 69.0 in | Wt 204.2 lb

## 2020-02-13 DIAGNOSIS — I342 Nonrheumatic mitral (valve) stenosis: Secondary | ICD-10-CM

## 2020-02-13 DIAGNOSIS — Z952 Presence of prosthetic heart valve: Secondary | ICD-10-CM | POA: Diagnosis not present

## 2020-02-13 DIAGNOSIS — I10 Essential (primary) hypertension: Secondary | ICD-10-CM | POA: Diagnosis not present

## 2020-02-13 DIAGNOSIS — I5032 Chronic diastolic (congestive) heart failure: Secondary | ICD-10-CM

## 2020-02-13 DIAGNOSIS — I25708 Atherosclerosis of coronary artery bypass graft(s), unspecified, with other forms of angina pectoris: Secondary | ICD-10-CM | POA: Diagnosis not present

## 2020-02-13 DIAGNOSIS — I4811 Longstanding persistent atrial fibrillation: Secondary | ICD-10-CM | POA: Diagnosis not present

## 2020-02-13 NOTE — Patient Instructions (Signed)
Continue lasix 40 mg daily. Current weight is 204 lbs. If weight goes up by 2-3 lbs then take extra lasix in the afternoon until weight comes back down.  Continue coumadin.

## 2020-02-14 ENCOUNTER — Other Ambulatory Visit: Payer: Self-pay

## 2020-02-14 DIAGNOSIS — I110 Hypertensive heart disease with heart failure: Secondary | ICD-10-CM | POA: Diagnosis not present

## 2020-02-14 DIAGNOSIS — I2721 Secondary pulmonary arterial hypertension: Secondary | ICD-10-CM | POA: Diagnosis not present

## 2020-02-14 DIAGNOSIS — E1140 Type 2 diabetes mellitus with diabetic neuropathy, unspecified: Secondary | ICD-10-CM | POA: Diagnosis not present

## 2020-02-14 DIAGNOSIS — G309 Alzheimer's disease, unspecified: Secondary | ICD-10-CM | POA: Diagnosis not present

## 2020-02-14 DIAGNOSIS — J449 Chronic obstructive pulmonary disease, unspecified: Secondary | ICD-10-CM | POA: Diagnosis not present

## 2020-02-14 DIAGNOSIS — I4891 Unspecified atrial fibrillation: Secondary | ICD-10-CM | POA: Diagnosis not present

## 2020-02-14 DIAGNOSIS — F0280 Dementia in other diseases classified elsewhere without behavioral disturbance: Secondary | ICD-10-CM | POA: Diagnosis not present

## 2020-02-14 DIAGNOSIS — I2510 Atherosclerotic heart disease of native coronary artery without angina pectoris: Secondary | ICD-10-CM | POA: Diagnosis not present

## 2020-02-14 DIAGNOSIS — I509 Heart failure, unspecified: Secondary | ICD-10-CM | POA: Diagnosis not present

## 2020-02-14 MED ORDER — DONEPEZIL HCL 5 MG PO TABS: 5.0000 mg | ORAL_TABLET | Freq: Every day | ORAL | 1 refills | Status: DC

## 2020-02-14 NOTE — Telephone Encounter (Signed)
Received phone call from Colletta [place], RN at [facility] regarding concerns with patient's medications.   RN reports concerns with medication compliance and which medications patient is supposed to be taking.   RN reports that patient has still been receiving and is taking glipizide. Per note from  01/06/2020, patient was taken off of glipizide due to hypoglycemic concerns.   RN requesting current medication list be faxed to [facility] office at [phone].   Called patient's daughter and scheduled appointment on October 12,2021 to discuss medications and to follow up with PCP. Patient's daughter is also requesting medication refill on Aricept. Will pend and route to PCP  Talbot Grumbling, RN

## 2020-02-15 DIAGNOSIS — I2721 Secondary pulmonary arterial hypertension: Secondary | ICD-10-CM | POA: Diagnosis not present

## 2020-02-15 DIAGNOSIS — I2510 Atherosclerotic heart disease of native coronary artery without angina pectoris: Secondary | ICD-10-CM | POA: Diagnosis not present

## 2020-02-15 DIAGNOSIS — I509 Heart failure, unspecified: Secondary | ICD-10-CM | POA: Diagnosis not present

## 2020-02-15 DIAGNOSIS — J449 Chronic obstructive pulmonary disease, unspecified: Secondary | ICD-10-CM | POA: Diagnosis not present

## 2020-02-15 DIAGNOSIS — E1140 Type 2 diabetes mellitus with diabetic neuropathy, unspecified: Secondary | ICD-10-CM | POA: Diagnosis not present

## 2020-02-15 DIAGNOSIS — I110 Hypertensive heart disease with heart failure: Secondary | ICD-10-CM | POA: Diagnosis not present

## 2020-02-15 DIAGNOSIS — I4891 Unspecified atrial fibrillation: Secondary | ICD-10-CM | POA: Diagnosis not present

## 2020-02-15 DIAGNOSIS — G309 Alzheimer's disease, unspecified: Secondary | ICD-10-CM | POA: Diagnosis not present

## 2020-02-15 DIAGNOSIS — F0280 Dementia in other diseases classified elsewhere without behavioral disturbance: Secondary | ICD-10-CM | POA: Diagnosis not present

## 2020-02-19 DIAGNOSIS — I4891 Unspecified atrial fibrillation: Secondary | ICD-10-CM | POA: Diagnosis not present

## 2020-02-19 DIAGNOSIS — I509 Heart failure, unspecified: Secondary | ICD-10-CM | POA: Diagnosis not present

## 2020-02-19 DIAGNOSIS — I2510 Atherosclerotic heart disease of native coronary artery without angina pectoris: Secondary | ICD-10-CM | POA: Diagnosis not present

## 2020-02-19 DIAGNOSIS — I110 Hypertensive heart disease with heart failure: Secondary | ICD-10-CM | POA: Diagnosis not present

## 2020-02-19 DIAGNOSIS — F0280 Dementia in other diseases classified elsewhere without behavioral disturbance: Secondary | ICD-10-CM | POA: Diagnosis not present

## 2020-02-19 DIAGNOSIS — J449 Chronic obstructive pulmonary disease, unspecified: Secondary | ICD-10-CM | POA: Diagnosis not present

## 2020-02-19 DIAGNOSIS — E1140 Type 2 diabetes mellitus with diabetic neuropathy, unspecified: Secondary | ICD-10-CM | POA: Diagnosis not present

## 2020-02-19 DIAGNOSIS — I2721 Secondary pulmonary arterial hypertension: Secondary | ICD-10-CM | POA: Diagnosis not present

## 2020-02-19 DIAGNOSIS — G309 Alzheimer's disease, unspecified: Secondary | ICD-10-CM | POA: Diagnosis not present

## 2020-02-20 DIAGNOSIS — E1140 Type 2 diabetes mellitus with diabetic neuropathy, unspecified: Secondary | ICD-10-CM | POA: Diagnosis not present

## 2020-02-20 DIAGNOSIS — I2510 Atherosclerotic heart disease of native coronary artery without angina pectoris: Secondary | ICD-10-CM | POA: Diagnosis not present

## 2020-02-20 DIAGNOSIS — G309 Alzheimer's disease, unspecified: Secondary | ICD-10-CM | POA: Diagnosis not present

## 2020-02-20 DIAGNOSIS — F0280 Dementia in other diseases classified elsewhere without behavioral disturbance: Secondary | ICD-10-CM | POA: Diagnosis not present

## 2020-02-20 DIAGNOSIS — I110 Hypertensive heart disease with heart failure: Secondary | ICD-10-CM | POA: Diagnosis not present

## 2020-02-20 DIAGNOSIS — I4891 Unspecified atrial fibrillation: Secondary | ICD-10-CM | POA: Diagnosis not present

## 2020-02-20 DIAGNOSIS — J449 Chronic obstructive pulmonary disease, unspecified: Secondary | ICD-10-CM | POA: Diagnosis not present

## 2020-02-20 DIAGNOSIS — I509 Heart failure, unspecified: Secondary | ICD-10-CM | POA: Diagnosis not present

## 2020-02-20 DIAGNOSIS — I2721 Secondary pulmonary arterial hypertension: Secondary | ICD-10-CM | POA: Diagnosis not present

## 2020-02-21 DIAGNOSIS — G309 Alzheimer's disease, unspecified: Secondary | ICD-10-CM | POA: Diagnosis not present

## 2020-02-21 DIAGNOSIS — I509 Heart failure, unspecified: Secondary | ICD-10-CM | POA: Diagnosis not present

## 2020-02-21 DIAGNOSIS — I110 Hypertensive heart disease with heart failure: Secondary | ICD-10-CM | POA: Diagnosis not present

## 2020-02-21 DIAGNOSIS — I4891 Unspecified atrial fibrillation: Secondary | ICD-10-CM | POA: Diagnosis not present

## 2020-02-21 DIAGNOSIS — I2510 Atherosclerotic heart disease of native coronary artery without angina pectoris: Secondary | ICD-10-CM | POA: Diagnosis not present

## 2020-02-21 DIAGNOSIS — I2721 Secondary pulmonary arterial hypertension: Secondary | ICD-10-CM | POA: Diagnosis not present

## 2020-02-21 DIAGNOSIS — F0280 Dementia in other diseases classified elsewhere without behavioral disturbance: Secondary | ICD-10-CM | POA: Diagnosis not present

## 2020-02-21 DIAGNOSIS — E1140 Type 2 diabetes mellitus with diabetic neuropathy, unspecified: Secondary | ICD-10-CM | POA: Diagnosis not present

## 2020-02-21 DIAGNOSIS — J449 Chronic obstructive pulmonary disease, unspecified: Secondary | ICD-10-CM | POA: Diagnosis not present

## 2020-02-22 DIAGNOSIS — I4891 Unspecified atrial fibrillation: Secondary | ICD-10-CM | POA: Diagnosis not present

## 2020-02-22 DIAGNOSIS — I2721 Secondary pulmonary arterial hypertension: Secondary | ICD-10-CM | POA: Diagnosis not present

## 2020-02-22 DIAGNOSIS — J449 Chronic obstructive pulmonary disease, unspecified: Secondary | ICD-10-CM | POA: Diagnosis not present

## 2020-02-22 DIAGNOSIS — G309 Alzheimer's disease, unspecified: Secondary | ICD-10-CM | POA: Diagnosis not present

## 2020-02-22 DIAGNOSIS — I2510 Atherosclerotic heart disease of native coronary artery without angina pectoris: Secondary | ICD-10-CM | POA: Diagnosis not present

## 2020-02-22 DIAGNOSIS — F0280 Dementia in other diseases classified elsewhere without behavioral disturbance: Secondary | ICD-10-CM | POA: Diagnosis not present

## 2020-02-22 DIAGNOSIS — I509 Heart failure, unspecified: Secondary | ICD-10-CM | POA: Diagnosis not present

## 2020-02-22 DIAGNOSIS — E1140 Type 2 diabetes mellitus with diabetic neuropathy, unspecified: Secondary | ICD-10-CM | POA: Diagnosis not present

## 2020-02-22 DIAGNOSIS — I110 Hypertensive heart disease with heart failure: Secondary | ICD-10-CM | POA: Diagnosis not present

## 2020-02-27 ENCOUNTER — Telehealth: Payer: Self-pay | Admitting: *Deleted

## 2020-02-27 DIAGNOSIS — I4891 Unspecified atrial fibrillation: Secondary | ICD-10-CM | POA: Diagnosis not present

## 2020-02-27 DIAGNOSIS — I509 Heart failure, unspecified: Secondary | ICD-10-CM | POA: Diagnosis not present

## 2020-02-27 DIAGNOSIS — F0280 Dementia in other diseases classified elsewhere without behavioral disturbance: Secondary | ICD-10-CM | POA: Diagnosis not present

## 2020-02-27 DIAGNOSIS — I110 Hypertensive heart disease with heart failure: Secondary | ICD-10-CM | POA: Diagnosis not present

## 2020-02-27 DIAGNOSIS — I2510 Atherosclerotic heart disease of native coronary artery without angina pectoris: Secondary | ICD-10-CM | POA: Diagnosis not present

## 2020-02-27 DIAGNOSIS — J449 Chronic obstructive pulmonary disease, unspecified: Secondary | ICD-10-CM | POA: Diagnosis not present

## 2020-02-27 DIAGNOSIS — E1140 Type 2 diabetes mellitus with diabetic neuropathy, unspecified: Secondary | ICD-10-CM | POA: Diagnosis not present

## 2020-02-27 DIAGNOSIS — I2721 Secondary pulmonary arterial hypertension: Secondary | ICD-10-CM | POA: Diagnosis not present

## 2020-02-27 DIAGNOSIS — G309 Alzheimer's disease, unspecified: Secondary | ICD-10-CM | POA: Diagnosis not present

## 2020-02-27 NOTE — Telephone Encounter (Signed)
Received call from [place] at [facility], she is the home health RN.  Patient was seen by PT today at home and is complaining of increased urination, lower abdominal pain and increased confusion.  She is asking for a verbal order to collect a urine when she goes out to his home tomorrow.  Verbal given to Crystal and will inform provider.  Patient has an upcoming appointment in October at [facility].  Ayla Dunigan,CMA

## 2020-02-28 DIAGNOSIS — I509 Heart failure, unspecified: Secondary | ICD-10-CM | POA: Diagnosis not present

## 2020-02-28 DIAGNOSIS — G309 Alzheimer's disease, unspecified: Secondary | ICD-10-CM | POA: Diagnosis not present

## 2020-02-28 DIAGNOSIS — I110 Hypertensive heart disease with heart failure: Secondary | ICD-10-CM | POA: Diagnosis not present

## 2020-02-28 DIAGNOSIS — I2721 Secondary pulmonary arterial hypertension: Secondary | ICD-10-CM | POA: Diagnosis not present

## 2020-02-28 DIAGNOSIS — F0280 Dementia in other diseases classified elsewhere without behavioral disturbance: Secondary | ICD-10-CM | POA: Diagnosis not present

## 2020-02-28 DIAGNOSIS — I4891 Unspecified atrial fibrillation: Secondary | ICD-10-CM | POA: Diagnosis not present

## 2020-02-28 DIAGNOSIS — J449 Chronic obstructive pulmonary disease, unspecified: Secondary | ICD-10-CM | POA: Diagnosis not present

## 2020-02-28 DIAGNOSIS — E1140 Type 2 diabetes mellitus with diabetic neuropathy, unspecified: Secondary | ICD-10-CM | POA: Diagnosis not present

## 2020-02-28 DIAGNOSIS — I2510 Atherosclerotic heart disease of native coronary artery without angina pectoris: Secondary | ICD-10-CM | POA: Diagnosis not present

## 2020-02-29 DIAGNOSIS — N390 Urinary tract infection, site not specified: Secondary | ICD-10-CM | POA: Diagnosis not present

## 2020-03-05 DIAGNOSIS — G309 Alzheimer's disease, unspecified: Secondary | ICD-10-CM | POA: Diagnosis not present

## 2020-03-05 DIAGNOSIS — I509 Heart failure, unspecified: Secondary | ICD-10-CM | POA: Diagnosis not present

## 2020-03-05 DIAGNOSIS — I2721 Secondary pulmonary arterial hypertension: Secondary | ICD-10-CM | POA: Diagnosis not present

## 2020-03-05 DIAGNOSIS — I2510 Atherosclerotic heart disease of native coronary artery without angina pectoris: Secondary | ICD-10-CM | POA: Diagnosis not present

## 2020-03-05 DIAGNOSIS — I110 Hypertensive heart disease with heart failure: Secondary | ICD-10-CM | POA: Diagnosis not present

## 2020-03-05 DIAGNOSIS — J449 Chronic obstructive pulmonary disease, unspecified: Secondary | ICD-10-CM | POA: Diagnosis not present

## 2020-03-05 DIAGNOSIS — I4891 Unspecified atrial fibrillation: Secondary | ICD-10-CM | POA: Diagnosis not present

## 2020-03-05 DIAGNOSIS — E1140 Type 2 diabetes mellitus with diabetic neuropathy, unspecified: Secondary | ICD-10-CM | POA: Diagnosis not present

## 2020-03-05 DIAGNOSIS — F0280 Dementia in other diseases classified elsewhere without behavioral disturbance: Secondary | ICD-10-CM | POA: Diagnosis not present

## 2020-03-08 ENCOUNTER — Telehealth: Payer: Self-pay | Admitting: Family Medicine

## 2020-03-08 DIAGNOSIS — I4891 Unspecified atrial fibrillation: Secondary | ICD-10-CM | POA: Diagnosis not present

## 2020-03-08 DIAGNOSIS — I2721 Secondary pulmonary arterial hypertension: Secondary | ICD-10-CM | POA: Diagnosis not present

## 2020-03-08 DIAGNOSIS — G309 Alzheimer's disease, unspecified: Secondary | ICD-10-CM | POA: Diagnosis not present

## 2020-03-08 DIAGNOSIS — F0280 Dementia in other diseases classified elsewhere without behavioral disturbance: Secondary | ICD-10-CM | POA: Diagnosis not present

## 2020-03-08 DIAGNOSIS — J449 Chronic obstructive pulmonary disease, unspecified: Secondary | ICD-10-CM | POA: Diagnosis not present

## 2020-03-08 DIAGNOSIS — I110 Hypertensive heart disease with heart failure: Secondary | ICD-10-CM | POA: Diagnosis not present

## 2020-03-08 DIAGNOSIS — I509 Heart failure, unspecified: Secondary | ICD-10-CM | POA: Diagnosis not present

## 2020-03-08 DIAGNOSIS — I2510 Atherosclerotic heart disease of native coronary artery without angina pectoris: Secondary | ICD-10-CM | POA: Diagnosis not present

## 2020-03-08 DIAGNOSIS — E1140 Type 2 diabetes mellitus with diabetic neuropathy, unspecified: Secondary | ICD-10-CM | POA: Diagnosis not present

## 2020-03-08 MED ORDER — AMOXICILLIN 500 MG PO CAPS: 500.0000 mg | ORAL_CAPSULE | Freq: Three times a day (TID) | ORAL | 0 refills | Status: AC

## 2020-03-08 NOTE — Telephone Encounter (Signed)
Call from [place] at [facility] [phone]  Culture show Proteus sensitive to all antibiotics except Nitrofurantoin  Spoke w his daughter [name].  Sent in Rx for Amoxicillin to start immediately  I will see him next week as scheduled

## 2020-03-11 DIAGNOSIS — I509 Heart failure, unspecified: Secondary | ICD-10-CM | POA: Diagnosis not present

## 2020-03-11 DIAGNOSIS — I2510 Atherosclerotic heart disease of native coronary artery without angina pectoris: Secondary | ICD-10-CM | POA: Diagnosis not present

## 2020-03-11 DIAGNOSIS — I4891 Unspecified atrial fibrillation: Secondary | ICD-10-CM | POA: Diagnosis not present

## 2020-03-11 DIAGNOSIS — I110 Hypertensive heart disease with heart failure: Secondary | ICD-10-CM | POA: Diagnosis not present

## 2020-03-11 DIAGNOSIS — G309 Alzheimer's disease, unspecified: Secondary | ICD-10-CM | POA: Diagnosis not present

## 2020-03-11 DIAGNOSIS — I2721 Secondary pulmonary arterial hypertension: Secondary | ICD-10-CM | POA: Diagnosis not present

## 2020-03-11 DIAGNOSIS — F0280 Dementia in other diseases classified elsewhere without behavioral disturbance: Secondary | ICD-10-CM | POA: Diagnosis not present

## 2020-03-11 DIAGNOSIS — J449 Chronic obstructive pulmonary disease, unspecified: Secondary | ICD-10-CM | POA: Diagnosis not present

## 2020-03-11 DIAGNOSIS — E1140 Type 2 diabetes mellitus with diabetic neuropathy, unspecified: Secondary | ICD-10-CM | POA: Diagnosis not present

## 2020-03-12 ENCOUNTER — Ambulatory Visit (INDEPENDENT_AMBULATORY_CARE_PROVIDER_SITE_OTHER): Payer: Medicare HMO | Admitting: Family Medicine

## 2020-03-12 ENCOUNTER — Encounter: Payer: Self-pay | Admitting: Family Medicine

## 2020-03-12 ENCOUNTER — Other Ambulatory Visit: Payer: Self-pay

## 2020-03-12 DIAGNOSIS — R159 Full incontinence of feces: Secondary | ICD-10-CM | POA: Diagnosis not present

## 2020-03-12 DIAGNOSIS — D128 Benign neoplasm of rectum: Secondary | ICD-10-CM | POA: Diagnosis not present

## 2020-03-12 DIAGNOSIS — R634 Abnormal weight loss: Secondary | ICD-10-CM | POA: Diagnosis not present

## 2020-03-12 MED ORDER — DIPHENOXYLATE-ATROPINE 2.5-0.025 MG PO TABS
1.0000 | ORAL_TABLET | Freq: Two times a day (BID) | ORAL | 0 refills | Status: DC | PRN
Start: 2020-03-12 — End: 2020-07-23

## 2020-03-12 NOTE — Assessment & Plan Note (Signed)
Ask them to make an appointment with Dr Johney Maine

## 2020-03-12 NOTE — Progress Notes (Signed)
    SUBJECTIVE:   CHIEF COMPLAINT / HPI:   UTI On amoxicillin.  Not having any urinary symptoms.  No fever or dysuria  MEDICATIONS Did not bring his medications.  His Daughter thinks current list is complete but not sure  INCONTINENCE Having frequent loose stools and is incontinent of feces.  Has been this way for months.  Seems to have explosive diarrhea when eats gravies or boost drinks.  Has seen Dr Johney Maine for rectal surgery in past as well as gastroenterology.  Take otc imodium regularly  WEIGHT LOSS Eats sporadically and not very much according to daughter.  No vomiting or recalled abdomen pain.  No fever   PERTINENT  PMH / PSH: being followed by cardiology.  His daughter [name] visits him several times a day and they have home nursing visiting.  His daughter Roselie Awkward is also very involved  OBJECTIVE:   BP 122/80   Pulse 61   Ht 5\' 9"  (1.753 m)   Wt 193 lb 9.6 oz (87.8 kg)   SpO2 97%   BMI 28.59 kg/m   Alert does not know his medications Mobility:able to get up and down from exam table with balance assistance H - RRR L-Clear Minimal edema   ASSESSMENT/PLAN:   Adenomatous rectal polyp with high grade dysplasia s/p TEM resection 05/26/2016 Ask them to make an appointment with Dr Johney Maine   Incontinence, feces Is SP rectal surgery with Dr Johney Maine.  Ask them to follow up with him   Weight loss No clear cause than perhaps decreased intake and possible malabsorption.  Recent blood work unrevealing.  Referral placed to GI.  Ask family to bring in all his medications      Lind Covert, MD [place]

## 2020-03-12 NOTE — Assessment & Plan Note (Signed)
No clear cause than perhaps decreased intake and possible malabsorption.  Recent blood work unrevealing.  Referral placed to GI.  Ask family to bring in all his medications

## 2020-03-12 NOTE — Patient Instructions (Addendum)
Good to see you today!  Thanks for coming in.  Make an appointment with Dr Johney Maine   Make an appointment with Elijah Birk for loose stools  Try lomotil a few times to see if will help with diarrhea  Please bring in all his medication bottles next visit  Come see me in 2-3 weeks

## 2020-03-12 NOTE — Assessment & Plan Note (Signed)
Is SP rectal surgery with Dr Johney Maine.  Ask them to follow up with him

## 2020-03-13 DIAGNOSIS — J449 Chronic obstructive pulmonary disease, unspecified: Secondary | ICD-10-CM | POA: Diagnosis not present

## 2020-03-13 DIAGNOSIS — I2721 Secondary pulmonary arterial hypertension: Secondary | ICD-10-CM | POA: Diagnosis not present

## 2020-03-13 DIAGNOSIS — F0280 Dementia in other diseases classified elsewhere without behavioral disturbance: Secondary | ICD-10-CM | POA: Diagnosis not present

## 2020-03-13 DIAGNOSIS — I2510 Atherosclerotic heart disease of native coronary artery without angina pectoris: Secondary | ICD-10-CM | POA: Diagnosis not present

## 2020-03-13 DIAGNOSIS — I4891 Unspecified atrial fibrillation: Secondary | ICD-10-CM | POA: Diagnosis not present

## 2020-03-13 DIAGNOSIS — I110 Hypertensive heart disease with heart failure: Secondary | ICD-10-CM | POA: Diagnosis not present

## 2020-03-13 DIAGNOSIS — G309 Alzheimer's disease, unspecified: Secondary | ICD-10-CM | POA: Diagnosis not present

## 2020-03-13 DIAGNOSIS — I509 Heart failure, unspecified: Secondary | ICD-10-CM | POA: Diagnosis not present

## 2020-03-13 DIAGNOSIS — E1140 Type 2 diabetes mellitus with diabetic neuropathy, unspecified: Secondary | ICD-10-CM | POA: Diagnosis not present

## 2020-03-14 DIAGNOSIS — F0280 Dementia in other diseases classified elsewhere without behavioral disturbance: Secondary | ICD-10-CM | POA: Diagnosis not present

## 2020-03-14 DIAGNOSIS — J449 Chronic obstructive pulmonary disease, unspecified: Secondary | ICD-10-CM | POA: Diagnosis not present

## 2020-03-14 DIAGNOSIS — I4891 Unspecified atrial fibrillation: Secondary | ICD-10-CM | POA: Diagnosis not present

## 2020-03-14 DIAGNOSIS — E1140 Type 2 diabetes mellitus with diabetic neuropathy, unspecified: Secondary | ICD-10-CM | POA: Diagnosis not present

## 2020-03-14 DIAGNOSIS — I509 Heart failure, unspecified: Secondary | ICD-10-CM | POA: Diagnosis not present

## 2020-03-14 DIAGNOSIS — I2510 Atherosclerotic heart disease of native coronary artery without angina pectoris: Secondary | ICD-10-CM | POA: Diagnosis not present

## 2020-03-14 DIAGNOSIS — I110 Hypertensive heart disease with heart failure: Secondary | ICD-10-CM | POA: Diagnosis not present

## 2020-03-14 DIAGNOSIS — I2721 Secondary pulmonary arterial hypertension: Secondary | ICD-10-CM | POA: Diagnosis not present

## 2020-03-14 DIAGNOSIS — G309 Alzheimer's disease, unspecified: Secondary | ICD-10-CM | POA: Diagnosis not present

## 2020-03-18 ENCOUNTER — Inpatient Hospital Stay (HOSPITAL_COMMUNITY)
Admission: EM | Admit: 2020-03-18 | Discharge: 2020-03-27 | DRG: 556 | Disposition: A | Discharge status: Skilled Nursing Facility | Payer: Medicare HMO | Attending: Internal Medicine | Admitting: Internal Medicine

## 2020-03-18 ENCOUNTER — Other Ambulatory Visit: Payer: Self-pay

## 2020-03-18 ENCOUNTER — Encounter (HOSPITAL_COMMUNITY): Payer: Self-pay | Admitting: Emergency Medicine

## 2020-03-18 ENCOUNTER — Emergency Department (HOSPITAL_COMMUNITY): Payer: Medicare HMO

## 2020-03-18 DIAGNOSIS — I10 Essential (primary) hypertension: Secondary | ICD-10-CM | POA: Diagnosis not present

## 2020-03-18 DIAGNOSIS — R945 Abnormal results of liver function studies: Secondary | ICD-10-CM

## 2020-03-18 DIAGNOSIS — M5136 Other intervertebral disc degeneration, lumbar region: Secondary | ICD-10-CM | POA: Diagnosis present

## 2020-03-18 DIAGNOSIS — I700 Atherosclerosis of aorta: Secondary | ICD-10-CM | POA: Diagnosis not present

## 2020-03-18 DIAGNOSIS — K219 Gastro-esophageal reflux disease without esophagitis: Secondary | ICD-10-CM | POA: Diagnosis present

## 2020-03-18 DIAGNOSIS — Z20822 Contact with and (suspected) exposure to covid-19: Secondary | ICD-10-CM | POA: Diagnosis present

## 2020-03-18 DIAGNOSIS — G4733 Obstructive sleep apnea (adult) (pediatric): Secondary | ICD-10-CM | POA: Diagnosis present

## 2020-03-18 DIAGNOSIS — Z7901 Long term (current) use of anticoagulants: Secondary | ICD-10-CM

## 2020-03-18 DIAGNOSIS — M48061 Spinal stenosis, lumbar region without neurogenic claudication: Secondary | ICD-10-CM | POA: Diagnosis not present

## 2020-03-18 DIAGNOSIS — R269 Unspecified abnormalities of gait and mobility: Secondary | ICD-10-CM | POA: Diagnosis not present

## 2020-03-18 DIAGNOSIS — M2550 Pain in unspecified joint: Secondary | ICD-10-CM | POA: Diagnosis not present

## 2020-03-18 DIAGNOSIS — I509 Heart failure, unspecified: Secondary | ICD-10-CM | POA: Diagnosis not present

## 2020-03-18 DIAGNOSIS — E871 Hypo-osmolality and hyponatremia: Secondary | ICD-10-CM | POA: Diagnosis not present

## 2020-03-18 DIAGNOSIS — I110 Hypertensive heart disease with heart failure: Secondary | ICD-10-CM | POA: Diagnosis present

## 2020-03-18 DIAGNOSIS — M6281 Muscle weakness (generalized): Principal | ICD-10-CM | POA: Diagnosis present

## 2020-03-18 DIAGNOSIS — D649 Anemia, unspecified: Secondary | ICD-10-CM | POA: Diagnosis present

## 2020-03-18 DIAGNOSIS — R319 Hematuria, unspecified: Secondary | ICD-10-CM | POA: Diagnosis present

## 2020-03-18 DIAGNOSIS — I2510 Atherosclerotic heart disease of native coronary artery without angina pectoris: Secondary | ICD-10-CM | POA: Diagnosis present

## 2020-03-18 DIAGNOSIS — M5030 Other cervical disc degeneration, unspecified cervical region: Secondary | ICD-10-CM | POA: Diagnosis present

## 2020-03-18 DIAGNOSIS — W19XXXA Unspecified fall, initial encounter: Secondary | ICD-10-CM | POA: Diagnosis not present

## 2020-03-18 DIAGNOSIS — Z8249 Family history of ischemic heart disease and other diseases of the circulatory system: Secondary | ICD-10-CM | POA: Diagnosis not present

## 2020-03-18 DIAGNOSIS — R531 Weakness: Secondary | ICD-10-CM

## 2020-03-18 DIAGNOSIS — M1990 Unspecified osteoarthritis, unspecified site: Secondary | ICD-10-CM | POA: Diagnosis present

## 2020-03-18 DIAGNOSIS — J449 Chronic obstructive pulmonary disease, unspecified: Secondary | ICD-10-CM | POA: Diagnosis not present

## 2020-03-18 DIAGNOSIS — D696 Thrombocytopenia, unspecified: Secondary | ICD-10-CM | POA: Diagnosis not present

## 2020-03-18 DIAGNOSIS — E663 Overweight: Secondary | ICD-10-CM | POA: Diagnosis present

## 2020-03-18 DIAGNOSIS — Z7401 Bed confinement status: Secondary | ICD-10-CM | POA: Diagnosis not present

## 2020-03-18 DIAGNOSIS — R609 Edema, unspecified: Secondary | ICD-10-CM | POA: Diagnosis not present

## 2020-03-18 DIAGNOSIS — N390 Urinary tract infection, site not specified: Secondary | ICD-10-CM | POA: Diagnosis not present

## 2020-03-18 DIAGNOSIS — I480 Paroxysmal atrial fibrillation: Secondary | ICD-10-CM | POA: Diagnosis present

## 2020-03-18 DIAGNOSIS — M47816 Spondylosis without myelopathy or radiculopathy, lumbar region: Secondary | ICD-10-CM | POA: Diagnosis not present

## 2020-03-18 DIAGNOSIS — I2721 Secondary pulmonary arterial hypertension: Secondary | ICD-10-CM | POA: Diagnosis not present

## 2020-03-18 DIAGNOSIS — R262 Difficulty in walking, not elsewhere classified: Secondary | ICD-10-CM | POA: Diagnosis present

## 2020-03-18 DIAGNOSIS — G309 Alzheimer's disease, unspecified: Secondary | ICD-10-CM | POA: Diagnosis not present

## 2020-03-18 DIAGNOSIS — I5030 Unspecified diastolic (congestive) heart failure: Secondary | ICD-10-CM | POA: Diagnosis not present

## 2020-03-18 DIAGNOSIS — R7989 Other specified abnormal findings of blood chemistry: Secondary | ICD-10-CM | POA: Diagnosis not present

## 2020-03-18 DIAGNOSIS — Z951 Presence of aortocoronary bypass graft: Secondary | ICD-10-CM

## 2020-03-18 DIAGNOSIS — D6869 Other thrombophilia: Secondary | ICD-10-CM | POA: Diagnosis not present

## 2020-03-18 DIAGNOSIS — R5381 Other malaise: Secondary | ICD-10-CM | POA: Diagnosis not present

## 2020-03-18 DIAGNOSIS — M50121 Cervical disc disorder at C4-C5 level with radiculopathy: Secondary | ICD-10-CM | POA: Diagnosis not present

## 2020-03-18 DIAGNOSIS — Z87891 Personal history of nicotine dependence: Secondary | ICD-10-CM

## 2020-03-18 DIAGNOSIS — R52 Pain, unspecified: Secondary | ICD-10-CM | POA: Diagnosis not present

## 2020-03-18 DIAGNOSIS — Z953 Presence of xenogenic heart valve: Secondary | ICD-10-CM

## 2020-03-18 DIAGNOSIS — I4891 Unspecified atrial fibrillation: Secondary | ICD-10-CM | POA: Diagnosis not present

## 2020-03-18 DIAGNOSIS — Z79899 Other long term (current) drug therapy: Secondary | ICD-10-CM

## 2020-03-18 DIAGNOSIS — M4802 Spinal stenosis, cervical region: Secondary | ICD-10-CM | POA: Diagnosis not present

## 2020-03-18 DIAGNOSIS — Z9109 Other allergy status, other than to drugs and biological substances: Secondary | ICD-10-CM

## 2020-03-18 DIAGNOSIS — J90 Pleural effusion, not elsewhere classified: Secondary | ICD-10-CM | POA: Diagnosis not present

## 2020-03-18 DIAGNOSIS — F0390 Unspecified dementia without behavioral disturbance: Secondary | ICD-10-CM | POA: Diagnosis present

## 2020-03-18 DIAGNOSIS — E785 Hyperlipidemia, unspecified: Secondary | ICD-10-CM | POA: Diagnosis present

## 2020-03-18 DIAGNOSIS — Z955 Presence of coronary angioplasty implant and graft: Secondary | ICD-10-CM

## 2020-03-18 DIAGNOSIS — I7090 Unspecified atherosclerosis: Secondary | ICD-10-CM | POA: Diagnosis not present

## 2020-03-18 DIAGNOSIS — F0280 Dementia in other diseases classified elsewhere without behavioral disturbance: Secondary | ICD-10-CM | POA: Diagnosis not present

## 2020-03-18 DIAGNOSIS — R509 Fever, unspecified: Secondary | ICD-10-CM

## 2020-03-18 DIAGNOSIS — E1140 Type 2 diabetes mellitus with diabetic neuropathy, unspecified: Secondary | ICD-10-CM | POA: Diagnosis present

## 2020-03-18 DIAGNOSIS — R159 Full incontinence of feces: Secondary | ICD-10-CM | POA: Diagnosis present

## 2020-03-18 DIAGNOSIS — I5032 Chronic diastolic (congestive) heart failure: Secondary | ICD-10-CM | POA: Diagnosis present

## 2020-03-18 DIAGNOSIS — M5127 Other intervertebral disc displacement, lumbosacral region: Secondary | ICD-10-CM | POA: Diagnosis not present

## 2020-03-18 DIAGNOSIS — K7689 Other specified diseases of liver: Secondary | ICD-10-CM | POA: Diagnosis not present

## 2020-03-18 DIAGNOSIS — R29818 Other symptoms and signs involving the nervous system: Secondary | ICD-10-CM | POA: Diagnosis not present

## 2020-03-18 DIAGNOSIS — Z7951 Long term (current) use of inhaled steroids: Secondary | ICD-10-CM

## 2020-03-18 DIAGNOSIS — Z6828 Body mass index (BMI) 28.0-28.9, adult: Secondary | ICD-10-CM

## 2020-03-18 DIAGNOSIS — Z9181 History of falling: Secondary | ICD-10-CM

## 2020-03-18 DIAGNOSIS — M545 Low back pain, unspecified: Secondary | ICD-10-CM | POA: Diagnosis not present

## 2020-03-18 DIAGNOSIS — R29898 Other symptoms and signs involving the musculoskeletal system: Secondary | ICD-10-CM | POA: Diagnosis present

## 2020-03-18 DIAGNOSIS — F4321 Adjustment disorder with depressed mood: Secondary | ICD-10-CM | POA: Diagnosis present

## 2020-03-18 DIAGNOSIS — I4811 Longstanding persistent atrial fibrillation: Secondary | ICD-10-CM | POA: Diagnosis not present

## 2020-03-18 DIAGNOSIS — K7460 Unspecified cirrhosis of liver: Secondary | ICD-10-CM | POA: Diagnosis present

## 2020-03-18 DIAGNOSIS — J9811 Atelectasis: Secondary | ICD-10-CM | POA: Diagnosis not present

## 2020-03-18 DIAGNOSIS — R9082 White matter disease, unspecified: Secondary | ICD-10-CM | POA: Diagnosis not present

## 2020-03-18 DIAGNOSIS — E878 Other disorders of electrolyte and fluid balance, not elsewhere classified: Secondary | ICD-10-CM | POA: Diagnosis not present

## 2020-03-18 LAB — CBC WITH DIFFERENTIAL/PLATELET
Abs Immature Granulocytes: 0.04 10*3/uL (ref 0.00–0.07)
Basophils Absolute: 0 10*3/uL (ref 0.0–0.1)
Basophils Relative: 0 %
Eosinophils Absolute: 0 10*3/uL (ref 0.0–0.5)
Eosinophils Relative: 0 %
HCT: 40.9 % (ref 39.0–52.0)
Hemoglobin: 12.8 g/dL — ABNORMAL LOW (ref 13.0–17.0)
Immature Granulocytes: 0 %
Lymphocytes Relative: 16 %
Lymphs Abs: 1.6 10*3/uL (ref 0.7–4.0)
MCH: 28 pg (ref 26.0–34.0)
MCHC: 31.3 g/dL (ref 30.0–36.0)
MCV: 89.5 fL (ref 80.0–100.0)
Monocytes Absolute: 0.7 10*3/uL (ref 0.1–1.0)
Monocytes Relative: 6 %
Neutro Abs: 8.1 10*3/uL — ABNORMAL HIGH (ref 1.7–7.7)
Neutrophils Relative %: 78 %
Platelets: 159 10*3/uL (ref 150–400)
RBC: 4.57 MIL/uL (ref 4.22–5.81)
RDW: 15.2 % (ref 11.5–15.5)
WBC: 10.5 10*3/uL (ref 4.0–10.5)
nRBC: 0 % (ref 0.0–0.2)

## 2020-03-18 LAB — PROTIME-INR
INR: 2.3 — ABNORMAL HIGH (ref 0.8–1.2)
Prothrombin Time: 24.1 seconds — ABNORMAL HIGH (ref 11.4–15.2)

## 2020-03-18 LAB — BASIC METABOLIC PANEL
Anion gap: 11 (ref 5–15)
BUN: 21 mg/dL (ref 8–23)
CO2: 28 mmol/L (ref 22–32)
Calcium: 10.4 mg/dL — ABNORMAL HIGH (ref 8.9–10.3)
Chloride: 97 mmol/L — ABNORMAL LOW (ref 98–111)
Creatinine, Ser: 0.96 mg/dL (ref 0.61–1.24)
GFR, Estimated: >60 mL/min (ref >60)
Glucose, Bld: 152 mg/dL — ABNORMAL HIGH (ref 70–99)
Potassium: 4.2 mmol/L (ref 3.5–5.1)
Sodium: 136 mmol/L (ref 135–145)

## 2020-03-18 LAB — RESPIRATORY PANEL BY RT PCR (FLU A&B, COVID)
Influenza A by PCR: NEGATIVE
Influenza B by PCR: NEGATIVE
SARS Coronavirus 2 by RT PCR: NEGATIVE

## 2020-03-18 LAB — LACTIC ACID, PLASMA: Lactic Acid, Venous: 1.5 mmol/L (ref 0.5–1.9)

## 2020-03-18 LAB — CBG MONITORING, ED: Glucose-Capillary: 163 mg/dL — ABNORMAL HIGH (ref 70–99)

## 2020-03-18 MED ORDER — ACETAMINOPHEN 325 MG PO TABS
650.0000 mg | ORAL_TABLET | Freq: Four times a day (QID) | ORAL | Status: DC | PRN
  Administered 2020-03-22: 650 mg via ORAL
  Filled 2020-03-18: qty 2

## 2020-03-18 MED ORDER — MOMETASONE FURO-FORMOTEROL FUM 100-5 MCG/ACT IN AERO
2.0000 | INHALATION_SPRAY | Freq: Two times a day (BID) | RESPIRATORY_TRACT | Status: DC
  Administered 2020-03-19 – 2020-03-27 (×15): 2 via RESPIRATORY_TRACT
  Filled 2020-03-18: qty 8.8

## 2020-03-18 MED ORDER — SODIUM CHLORIDE 0.9 % IV SOLN: Freq: Once | INTRAVENOUS | Status: AC

## 2020-03-18 MED ORDER — ACETAMINOPHEN 650 MG RE SUPP: 650.0000 mg | Freq: Four times a day (QID) | RECTAL | Status: DC | PRN

## 2020-03-18 MED ORDER — ATORVASTATIN CALCIUM 40 MG PO TABS
40.0000 mg | ORAL_TABLET | Freq: Every day | ORAL | Status: DC
  Administered 2020-03-19 – 2020-03-22 (×4): 40 mg via ORAL
  Filled 2020-03-18 (×4): qty 1

## 2020-03-18 MED ORDER — DONEPEZIL HCL 5 MG PO TABS
5.0000 mg | ORAL_TABLET | Freq: Every day | ORAL | Status: DC
  Administered 2020-03-19 – 2020-03-26 (×8): 5 mg via ORAL
  Filled 2020-03-18 (×8): qty 1

## 2020-03-18 MED ORDER — SODIUM CHLORIDE 0.9 % IV SOLN
1.0000 g | Freq: Once | INTRAVENOUS | Status: AC
  Administered 2020-03-18: 1 g via INTRAVENOUS
  Filled 2020-03-18: qty 10

## 2020-03-18 MED ORDER — METOPROLOL SUCCINATE ER 50 MG PO TB24
75.0000 mg | ORAL_TABLET | Freq: Every day | ORAL | Status: DC
  Administered 2020-03-19 – 2020-03-27 (×9): 75 mg via ORAL
  Filled 2020-03-18 (×3): qty 1
  Filled 2020-03-18: qty 2
  Filled 2020-03-18 (×5): qty 1

## 2020-03-18 NOTE — ED Triage Notes (Signed)
Patient comes from home by [facility]. Patient is complaining of weakness of both legs. Patient state he has falls in the past but not today.

## 2020-03-18 NOTE — ED Provider Notes (Signed)
[name] DEPT Provider Note   CSN: [id number] Arrival date & time: 03/18/20  1914     History Chief Complaint  Patient presents with  . Weakness  . suspected sepsis    Matthew Edwards is a 79 y.o. male.  79 year old male with prior medical history as detailed below presents for evaluation.  Patient is accompanied by his daughter who provides majority of history.  Patient is currently on amoxicillin for treatment of UTI.  Amoxicillin was started last week.  Patient has been complaining of suprapubic tenderness for the last 2 days.  No fevers reported at home.  Patient is also feeling some generalized weakness.  No chest pain or shortness of breath noted.  Patient has a prior history significant for frequent UTIs.  Patient ambulates with assistance of walker.  Daughter reports that the patient has essentially been unable to ambulate secondary to generalized weakness over the last 24 hours.  Patient without reported upper abdominal pain, nausea, vomiting, back pain, or other acute complaint.  Patient was noted to be febrile with EMS transport.  The history is provided by the patient, a relative and medical records.  Weakness Severity:  Mild Onset quality:  Gradual Timing:  Constant Progression:  Waxing and waning Chronicity:  New Relieved by:  Nothing Worsened by:  Nothing      Past Medical History:  Diagnosis Date  . Arthritis   . Atrial fibrillation ([place])   . CAD (coronary artery disease) 2012   [place]-LAD 2 or 3 srents  . Complication of anesthesia    "loopy after polpy removal dec 2017, lasted several days"  . COPD (chronic obstructive pulmonary disease) ([place])   . Dementia ([place])   . GERD (gastroesophageal reflux disease)   . HEART FAILURE, CONGESTIVE UNSPEC 02/23/2007   Qualifier: Diagnosis of  By: Mellody Drown MD, [facility]    . Hyperlipidemia   . Hypertension   . OSA (obstructive sleep apnea) 11/13/2014  . S/P AVR    #25 mm Edwards  pericardial valve Bioprosthetic. Dr Cyndia Bent.  . Situational depression    "son passed 11/2013"  . Sleep apnea    occ uses c pap does not know settings   . Stented coronary artery 2013  . TOBACCO DEPENDENCE 07/29/2006   Qualifier: History of  By: Carlena Sax  MD, [place]    . Type II diabetes mellitus [facility])     Patient Active Problem List   Diagnosis Date Noted  . Incontinence, feces 03/12/2020  . Atrial fibrillation with RVR ([place])   . Cerumen impaction 11/23/2019  . Weight loss 11/21/2019  . Adenomatous rectal polyp with high grade dysplasia s/p TEM resection 05/26/2016 05/26/2016  . Spinal stenosis of lumbar region 11/28/2015  . OSA (obstructive sleep apnea) 11/13/2014  . Pulmonary HTN ([place]) 08/24/2014  . Acute on chronic congestive heart failure with left ventricular diastolic dysfunction ([place]) 08/02/2014  . Cognitive impairment 07/02/2014  . S/P AVR 12/29/2012  . Atrial fibrillation ([place])   . Type 2 diabetes mellitus with diabetic neuropathy, unspecified ([place]) 11/25/2011  . Vitamin D deficiency 02/21/2010  . Primary hyperparathyroidism ([place]) 06/04/2009  . Hypercalcemia 05/29/2009  . Essential hypertension 02/23/2007  . Coronary atherosclerosis 12/15/2006  . GERD 12/15/2006  . COPD (chronic obstructive pulmonary disease) ([place]) 12/10/2006  . HLD (hyperlipidemia) 07/29/2006  . Alcohol abuse 07/29/2006  . Aortic valve disorder 07/29/2006    Past Surgical History:  Procedure Laterality Date  . AORTIC VALVE REPLACEMENT  07/2010   notes 07/28/2010  .  CARDIAC CATHETERIZATION  06/2010   Archie Endo 07/01/2010  . COLONOSCOPY WITH PROPOFOL N/A 02/08/2017   Procedure: COLONOSCOPY WITH PROPOFOL;  Surgeon: Doran Stabler, MD;  Location: [facility] ENDOSCOPY;  Service: Gastroenterology;  Laterality: N/A;  . CORONARY ANGIOPLASTY WITH STENT PLACEMENT  2006; 10/2006   "2"; 1/notes 10/01/2010  . CORONARY ARTERY BYPASS GRAFT  07/2010   "1"/notes 07/28/2010  . PARTIAL PROCTECTOMY BY TEM N/A 05/26/2016    Procedure: TEM PARTIAL PROCTECTOMY OF RECTAL MASS;  Surgeon: [name], MD;  Location: [facility] ORS;  Service: General;  Laterality: N/A;       Family History  Problem Relation Age of Onset  . Heart disease Mother   . Colon cancer Neg Hx   . Esophageal cancer Neg Hx   . Stomach cancer Neg Hx   . Pancreatic cancer Neg Hx   . Liver disease Neg Hx   . Colon polyps Neg Hx     Social History   Tobacco Use  . Smoking status: Former Smoker    Packs/day: 0.50    Years: 46.00    Pack years: 23.00    Types: Cigarettes  . Smokeless tobacco: Never Used  Vaping Use  . Vaping Use: Never used  Substance Use Topics  . Alcohol use: No  . Drug use: No    Home Medications Prior to Admission medications   Medication Sig Start Date End Date Taking? Authorizing Provider  atorvastatin (LIPITOR) 40 MG tablet Take 1 tablet (40 mg total) by mouth daily at 6 PM. TAKE 1 TABLET DAILY AT 6 PM 03/23/19   Donnamae Jude, MD  budesonide-formoterol [facility]) 80-4.5 MCG/ACT inhaler Inhale 2 puffs into the lungs 2 (two) times daily. 03/23/19   Donnamae Jude, MD  diphenoxylate-atropine (LOMOTIL) 2.5-0.025 MG tablet Take 1 tablet by mouth 2 (two) times daily as needed for diarrhea or loose stools. 03/12/20   Lind Covert, MD  donepezil (ARICEPT) 5 MG tablet Take 1 tablet (5 mg total) by mouth at bedtime. 02/14/20   Lind Covert, MD  furosemide (LASIX) 40 MG tablet PRN, take one tablet if 3lb weight gain in one day or 5lb or greater weight gain in one week. 01/11/20   Sharion Settler, DO  glucose blood test strip USE TO CHECK BLOOD SUGARS FOUR TIMES DAILY 07/05/18   Donnamae Jude, MD  metoprolol succinate (TOPROL-XL) 25 MG 24 hr tablet Take 3 tablets (75 mg total) by mouth daily. 01/12/20   Sharion Settler, DO  Multiple Vitamins-Minerals (MULTIVITAMIN WITH MINERALS) tablet Take 1 tablet by mouth every evening.    [provider]  nitroGLYCERIN (NITROSTAT) 0.4 MG SL tablet Place 1  tablet (0.4 mg total) under the tongue every 5 (five) minutes as needed. For chest pain 03/23/19   Donnamae Jude, MD  potassium chloride SA (KLOR-CON) 20 MEQ tablet Taken by mouth only on days lasix is taken 01/11/20   Simmons-Robinson, Riki Sheer, MD  warfarin (COUMADIN) 5 MG tablet Take 1 tablet (5 mg total) by mouth daily at 4 PM. 01/11/20   Simmons-Robinson, Riki Sheer, MD    Allergies    Pineapple concentrate  Review of Systems   Review of Systems  Neurological: Positive for weakness.  All other systems reviewed and are negative.   Physical Exam Updated Vital Signs BP 114/87 (BP Location: Right Arm)   Pulse 79   Temp (!) 101 F (38.3 C) (Oral)   Resp 16   Ht 5\' 9"  (1.753 m)   Wt 87.8  kg   SpO2 98%   BMI 28.59 kg/m   Physical Exam Vitals and nursing note reviewed.  Constitutional:      General: He is not in acute distress.    Appearance: Normal appearance. He is well-developed.  HENT:     Head: Normocephalic and atraumatic.  Eyes:     Conjunctiva/sclera: Conjunctivae normal.     Pupils: Pupils are equal, round, and reactive to light.  Cardiovascular:     Rate and Rhythm: Normal rate and regular rhythm.     Heart sounds: Normal heart sounds.  Pulmonary:     Effort: Pulmonary effort is normal. No respiratory distress.     Breath sounds: Normal breath sounds.  Abdominal:     General: There is no distension.     Palpations: Abdomen is soft.     Tenderness: There is no abdominal tenderness.  Musculoskeletal:        General: No deformity. Normal range of motion.     Cervical back: Normal range of motion and neck supple.  Skin:    General: Skin is warm and dry.  Neurological:     General: No focal deficit present.     Mental Status: He is alert and oriented to person, place, and time. Mental status is at baseline.     Cranial Nerves: No cranial nerve deficit.     Sensory: No sensory deficit.     Motor: No weakness.     Coordination: Coordination normal.     Gait: Gait  normal.     ED Results / Procedures / Treatments   Labs (all labs ordered are listed, but only abnormal results are displayed) Labs Reviewed  BASIC METABOLIC PANEL - Abnormal; Notable for the following components:      Result Value   Chloride 97 (*)    Glucose, Bld 152 (*)    Calcium 10.4 (*)    All other components within normal limits  CBC WITH DIFFERENTIAL/PLATELET - Abnormal; Notable for the following components:   Hemoglobin 12.8 (*)    Neutro Abs 8.1 (*)    All other components within normal limits  PROTIME-INR - Abnormal; Notable for the following components:   Prothrombin Time 24.1 (*)    INR 2.3 (*)    All other components within normal limits  CBG MONITORING, ED - Abnormal; Notable for the following components:   Glucose-Capillary 163 (*)    All other components within normal limits  CULTURE, BLOOD (ROUTINE X 2)  RESPIRATORY PANEL BY RT PCR (FLU A&B, COVID)  CULTURE, BLOOD (ROUTINE X 2)  LACTIC ACID, PLASMA  URINALYSIS, ROUTINE W REFLEX MICROSCOPIC  LACTIC ACID, PLASMA    EKG EKG Interpretation  Date/Time:  Monday March 18 2020 19:31:56 EDT Ventricular Rate:  68 PR Interval:    QRS Duration: 95 QT Interval:  357 QTC Calculation: 396 R Axis:   60 Text Interpretation: Atrial fibrillation RSR' in V1 or V2, probably normal variant Repol abnrm suggests ischemia, diffuse leads Minimal ST elevation, lateral leads 12 Lead; [place] Confirmed by Dene Gentry [id number]) on 03/18/2020 8:33:46 PM   Radiology DG Chest 2 View  Result Date: 03/18/2020 CLINICAL DATA:  Lower extremity weakness. EXAM: CHEST - 2 VIEW COMPARISON:  December 27, 2017 FINDINGS: Multiple sternal wires and vascular clips are present. A trace amount of atelectasis is seen along the lateral aspect of the right lung base. Very mild blunting of the left costophrenic angle is seen. No pneumothorax is identified. The cardiac silhouette is mildly enlarged.  An artificial cardiac valve is seen. The  visualized skeletal structures are unremarkable. IMPRESSION: 1. Evidence of prior median sternotomy/CABG. 2. Very mild right basilar atelectasis with a very small left pleural effusion. Electronically Signed   By: Virgina Norfolk M.D.   On: 03/18/2020 19:55    Procedures Procedures (including critical care time)  Medications Ordered in ED Medications - No data to display  ED Course  I have reviewed the triage vital signs and the nursing notes.  Pertinent labs & imaging results that were available during my care of the patient were reviewed by me and considered in my medical decision making (see chart for details).    MDM Rules/Calculators/A&P                          MDM  Screen complete  Matthew Edwards was evaluated in Emergency Department on 03/18/2020 for the symptoms described in the history of present illness. He was evaluated in the context of the global COVID-19 pandemic, which necessitated consideration that the patient might be at risk for infection with the SARS-CoV-2 virus that causes COVID-19. Institutional protocols and algorithms that pertain to the evaluation of patients at risk for COVID-19 are in a state of rapid change based on information released by regulatory bodies including the CDC and federal and state organizations. These policies and algorithms were followed during the patient's care in the ED.  Patient is presenting for evaluation of fever and reported weakness.  Patient recently diagnosed as an outpatient with UTI.  Patient started on amoxicillin for same.  Patient with worsening symptoms of weakness and fever over the last 24 hours.  Patient's chest x-ray and screening blood work are without significant abnormality.  Patient however would likely benefit from admission.  At time of admission discussion with hospitalist service - UA is still pending. Dr. Hal Hope will evaluate the patient for admission.    Final Clinical Impression(s) / ED  Diagnoses Final diagnoses:  Weakness  Fever, unspecified fever cause    Rx / DC Orders ED Discharge Orders    None       Valarie Merino, MD 03/18/20 [phone]

## 2020-03-18 NOTE — H&P (Signed)
History and Physical    Matthew Edwards LSL:[id number] DOB: 10-08-1940 DOA: 03/18/2020  PCP: Lind Covert, MD  Patient coming from: Home.  Chief Complaint: Weakness of the lower extremity.  HPI: Matthew Edwards is a 79 y.o. male with history of CAD status post CABG and stenting, A. fib, chronic incontinence of bowels after surgery in 2017 for polyp, aortic valve replacement with pericardial tissue, hypertension, dementia was brought to the ER after patient's home health aide found the patient was getting increasingly weak over the last couple of days.  About a month ago patient did have a fall and since then he was having some pain around the neck area.  Recently over the last 1 week patient was found to have some blood in the urine and patient's primary care physician had placed patient on antibiotics.  ED Course: In the ER patient had a fever of 101 F Covid test was negative.  UA still pending patient is refusing to get in and out cath.  On exam patient has good strength of the both lower extremity and good sensation.  CT head and CT lumbar spine was unremarkable.  Patient states he is feeling it difficult to ambulate because of the weakness.  Patient was empirically started on antibiotic despite patient not having any urine given since patient was having hematuria.  Patient admitted for weakness.  MRI C-spine is pending.  Labs are at baseline.  INR is therapeutic at 2.3.  Blood cultures were obtained along with urine studies ordered.  Review of Systems: As per HPI, rest all negative.   Past Medical History:  Diagnosis Date  . Arthritis   . Atrial fibrillation ([place])   . CAD (coronary artery disease) 2012   [place]-LAD 2 or 3 srents  . Complication of anesthesia    "loopy after polpy removal dec 2017, lasted several days"  . COPD (chronic obstructive pulmonary disease) ([place])   . Dementia ([place])   . GERD (gastroesophageal reflux disease)   . HEART FAILURE, CONGESTIVE UNSPEC  02/23/2007   Qualifier: Diagnosis of  By: Mellody Drown MD, [facility]    . Hyperlipidemia   . Hypertension   . OSA (obstructive sleep apnea) 11/13/2014  . S/P AVR    #25 mm Edwards pericardial valve Bioprosthetic. Dr Cyndia Bent.  . Situational depression    "son passed 11/2013"  . Sleep apnea    occ uses c pap does not know settings   . Stented coronary artery 2013  . TOBACCO DEPENDENCE 07/29/2006   Qualifier: History of  By: Carlena Sax  MD, [facility] [place]    . Type II diabetes mellitus ([place])     Past Surgical History:  Procedure Laterality Date  . AORTIC VALVE REPLACEMENT  07/2010   notes 07/28/2010  . CARDIAC CATHETERIZATION  06/2010   Archie Endo 07/01/2010  . COLONOSCOPY WITH PROPOFOL N/A 02/08/2017   Procedure: COLONOSCOPY WITH PROPOFOL;  Surgeon: Doran Stabler, MD;  Location: [facility];  Service: Gastroenterology;  Laterality: N/A;  . CORONARY ANGIOPLASTY WITH STENT PLACEMENT  2006; 10/2006   "2"; 1/notes 10/01/2010  . CORONARY ARTERY BYPASS GRAFT  07/2010   "1"/notes 07/28/2010  . PARTIAL PROCTECTOMY BY TEM N/A 05/26/2016   Procedure: TEM PARTIAL PROCTECTOMY OF RECTAL MASS;  Surgeon: [name], MD;  Location: [facility] ORS;  Service: General;  Laterality: N/A;     reports that he has quit smoking. His smoking use included cigarettes. He has a 23.00 pack-year smoking history. He has never used smokeless tobacco. He  reports that he does not drink alcohol and does not use drugs.  Allergies  Allergen Reactions  . Pineapple Concentrate Nausea And Vomiting    Family History  Problem Relation Age of Onset  . Heart disease Mother   . Colon cancer Neg Hx   . Esophageal cancer Neg Hx   . Stomach cancer Neg Hx   . Pancreatic cancer Neg Hx   . Liver disease Neg Hx   . Colon polyps Neg Hx     Prior to Admission medications   Medication Sig Start Date End Date Taking? Authorizing Provider  donepezil (ARICEPT) 5 MG tablet Take 1 tablet (5 mg total) by mouth at bedtime. 02/14/20  Yes Chambliss, Jeb Levering, MD    furosemide (LASIX) 40 MG tablet PRN, take one tablet if 3lb weight gain in one day or 5lb or greater weight gain in one week. 01/11/20  Yes Sharion Settler, DO  metoprolol succinate (TOPROL-XL) 25 MG 24 hr tablet Take 3 tablets (75 mg total) by mouth daily. 01/12/20  Yes Sharion Settler, DO  potassium chloride SA (KLOR-CON) 20 MEQ tablet Taken by mouth only on days lasix is taken 01/11/20  Yes Simmons-Robinson, Riki Sheer, MD  warfarin (COUMADIN) 5 MG tablet Take 1 tablet (5 mg total) by mouth daily at 4 PM. 01/11/20  Yes Simmons-Robinson, Makiera, MD  atorvastatin (LIPITOR) 40 MG tablet Take 1 tablet (40 mg total) by mouth daily at 6 PM. TAKE 1 TABLET DAILY AT 6 PM 03/23/19   Donnamae Jude, MD  budesonide-formoterol (SYMBICORT) 80-4.5 MCG/ACT inhaler Inhale 2 puffs into the lungs 2 (two) times daily. 03/23/19   Donnamae Jude, MD  diphenoxylate-atropine (LOMOTIL) 2.5-0.025 MG tablet Take 1 tablet by mouth 2 (two) times daily as needed for diarrhea or loose stools. 03/12/20   Lind Covert, MD  glucose blood test strip USE TO CHECK BLOOD SUGARS FOUR TIMES DAILY 07/05/18   Donnamae Jude, MD  Multiple Vitamins-Minerals (MULTIVITAMIN WITH MINERALS) tablet Take 1 tablet by mouth every evening.    [provider]  nitroGLYCERIN (NITROSTAT) 0.4 MG SL tablet Place 1 tablet (0.4 mg total) under the tongue every 5 (five) minutes as needed. For chest pain 03/23/19   Donnamae Jude, MD    Physical Exam: Constitutional: Moderately built and nourished. Vitals:   03/18/20 2135 03/18/20 2200 03/18/20 2300 03/18/20 2342  BP: 121/72 113/88 112/74 115/60  Pulse: 72 85 (!) 51 79  Resp: 20 (!) 26 (!) 22 (!) 21  Temp:      TempSrc:      SpO2: 98% 97% 97% 94%  Weight:      Height:       Eyes: Anicteric no pallor. ENMT: No discharge from the ears eyes nose or mouth. Neck: No mass felt.  No neck rigidity. Respiratory: No rhonchi or crepitations. Cardiovascular: S1-S2 heard. Abdomen: Soft  nontender bowel sounds present. Musculoskeletal: No edema. Skin: No rash. Neurologic: Alert awake oriented to place and person is able to move all extremities has good sensation but states he is finding it difficult to walk. Psychiatric: Has dementia but is oriented to his name and place.   Labs on Admission: I have personally reviewed following labs and imaging studies  CBC: Recent Labs  Lab 03/18/20 2001  WBC 10.5  NEUTROABS 8.1*  HGB 12.8*  HCT 40.9  MCV 89.5  PLT 400   Basic Metabolic Panel: Recent Labs  Lab 03/18/20 2001  NA 136  K 4.2  CL 97*  CO2 28  GLUCOSE 152*  BUN 21  CREATININE 0.96  CALCIUM 10.4*   GFR: Estimated Creatinine Clearance: 71.8 mL/min (by C-G formula based on SCr of 0.96 mg/dL). Liver Function Tests: No results for input(s): AST, ALT, ALKPHOS, BILITOT, PROT, ALBUMIN in the last 168 hours. No results for input(s): LIPASE, AMYLASE in the last 168 hours. No results for input(s): AMMONIA in the last 168 hours. Coagulation Profile: Recent Labs  Lab 03/18/20 2001  INR 2.3*   Cardiac Enzymes: No results for input(s): CKTOTAL, CKMB, CKMBINDEX, TROPONINI in the last 168 hours. BNP (last 3 results) No results for input(s): PROBNP in the last 8760 hours. HbA1C: No results for input(s): HGBA1C in the last 72 hours. CBG: Recent Labs  Lab 03/18/20 1936  GLUCAP 163*   Lipid Profile: No results for input(s): CHOL, HDL, LDLCALC, TRIG, CHOLHDL, LDLDIRECT in the last 72 hours. Thyroid Function Tests: No results for input(s): TSH, T4TOTAL, FREET4, T3FREE, THYROIDAB in the last 72 hours. Anemia Panel: No results for input(s): VITAMINB12, FOLATE, FERRITIN, TIBC, IRON, RETICCTPCT in the last 72 hours. Urine analysis:    Component Value Date/Time   COLORURINE AMBER (A) 01/02/2020 0517   APPEARANCEUR CLEAR 01/02/2020 0517   LABSPEC 1.025 01/02/2020 0517   PHURINE 5.0 01/02/2020 0517   GLUCOSEU NEGATIVE 01/02/2020 0517   HGBUR NEGATIVE 01/02/2020  0517   BILIRUBINUR NEGATIVE 01/02/2020 0517   BILIRUBINUR NEG 02/01/2015 0950   KETONESUR 5 (A) 01/02/2020 0517   PROTEINUR NEGATIVE 01/02/2020 0517   UROBILINOGEN 0.2 02/01/2015 0950   UROBILINOGEN 1.0 07/24/2010 1411   NITRITE NEGATIVE 01/02/2020 0517   LEUKOCYTESUR NEGATIVE 01/02/2020 0517   Sepsis Labs: @LABRCNTIP (procalcitonin:4,lacticidven:4) ) Recent Results (from the past 240 hour(s))  Culture, blood (Routine x 2)     Status: None (Preliminary result)   Collection Time: 03/18/20  8:00 PM   Specimen: BLOOD RIGHT FOREARM  Result Value Ref Range Status   Specimen Description   Final    BLOOD RIGHT FOREARM Performed at [facility] [address], [place], [place] [address]    Special Requests   Final    BOTTLES DRAWN AEROBIC AND ANAEROBIC Blood Culture adequate volume Performed at [facility] [address], [place], [place] [address]    Culture PENDING  Incomplete   Report Status PENDING  Incomplete  Respiratory Panel by RT PCR (Flu A&B, Covid) - Nasopharyngeal Swab     Status: None   Collection Time: 03/18/20  8:22 PM   Specimen: Nasopharyngeal Swab  Result Value Ref Range Status   SARS Coronavirus 2 by RT PCR NEGATIVE NEGATIVE Final    Comment: (NOTE) SARS-CoV-2 target nucleic acids are NOT DETECTED.  The SARS-CoV-2 RNA is generally detectable in upper respiratoy specimens during the acute phase of infection. The lowest concentration of SARS-CoV-2 viral copies this assay can detect is 131 copies/mL. A negative result does not preclude SARS-Cov-2 infection and should not be used as the sole basis for treatment or other patient management decisions. A negative result may occur with  improper specimen collection/handling, submission of specimen other than nasopharyngeal swab, presence of viral mutation(s) within the areas targeted by this assay, and inadequate number of viral copies (<131 copies/mL). A negative result must be combined with  clinical observations, patient history, and epidemiological information. The expected result is Negative.  Fact Sheet for Patients:  PinkCheek.be  Fact Sheet for Healthcare Providers:  GravelBags.it  This test is no t yet approved or cleared by the [place]  States FDA and  has been authorized for detection and/or diagnosis of SARS-CoV-2 by FDA under an Emergency Use Authorization (EUA). This EUA will remain  in effect (meaning this test can be used) for the duration of the COVID-19 declaration under Section 564(b)(1) of the Act, 21 U.S.C. section 360bbb-3(b)(1), unless the authorization is terminated or revoked sooner.     Influenza A by PCR NEGATIVE NEGATIVE Final   Influenza B by PCR NEGATIVE NEGATIVE Final    Comment: (NOTE) The Xpert Xpress SARS-CoV-2/FLU/RSV assay is intended as an aid in  the diagnosis of influenza from Nasopharyngeal swab specimens and  should not be used as a sole basis for treatment. Nasal washings and  aspirates are unacceptable for Xpert Xpress SARS-CoV-2/FLU/RSV  testing.  Fact Sheet for Patients: PinkCheek.be  Fact Sheet for Healthcare Providers: GravelBags.it  This test is not yet approved or cleared by the [place] FDA and  has been authorized for detection and/or diagnosis of SARS-CoV-2 by  FDA under an Emergency Use Authorization (EUA). This EUA will remain  in effect (meaning this test can be used) for the duration of the  Covid-19 declaration under Section 564(b)(1) of the Act, 21  U.S.C. section 360bbb-3(b)(1), unless the authorization is  terminated or revoked. Performed at [facility], [place] [address], [place], [place] [address]      Radiological Exams on Admission: DG Chest 2 View  Result Date: 03/18/2020 CLINICAL DATA:  Lower extremity weakness. EXAM: CHEST - 2 VIEW COMPARISON:  December 27, 2017  FINDINGS: Multiple sternal wires and vascular clips are present. A trace amount of atelectasis is seen along the lateral aspect of the right lung base. Very mild blunting of the left costophrenic angle is seen. No pneumothorax is identified. The cardiac silhouette is mildly enlarged. An artificial cardiac valve is seen. The visualized skeletal structures are unremarkable. IMPRESSION: 1. Evidence of prior median sternotomy/CABG. 2. Very mild right basilar atelectasis with a very small left pleural effusion. Electronically Signed   By: Virgina Norfolk M.D.   On: 03/18/2020 19:55    EKG: Independently reviewed.  A. fib rate controlled.  Assessment/Plan Principal Problem:   Weakness Active Problems:   Essential hypertension   Type 2 diabetes mellitus with diabetic neuropathy, unspecified (HCC)   Atrial fibrillation (HCC)   Spinal stenosis of lumbar region   Fever   Lower extremity weakness    1. Bilateral lower extremity weakness no focal deficit on exam.  CT head and lumbar spine unremarkable.  MRI C-spine has been ordered.  Physical therapy consult.  Weakness could also be from urinary tract infection.  On empiric antibiotics. 2. Fever could be from UTI.  Patient is yet to give a urine sample and is refusing to get in and out cath.  On empiric antibiotics since patient recently was diagnosed with UTI as outpatient.  Follow cultures. 3. Diabetes mellitus type 2 presently recently was taken off the medications by the primary care physician since patient blood sugar was running in the normal range as per the patient's daughter.  On sliding scale coverage. 4. A. fib rate controlled presently on Coumadin and metoprolol. 5. CAD status post stenting and CABG presently on Coumadin statins and beta-blockers. 6. Anemia follow CBC. 7. Chronic incontinence of bowel after surgery.   DVT prophylaxis: Coumadin. Code Status: Full code. Family Communication: Patient's daughter. Disposition Plan: To be  determined. Consults called: Physical therapy. Admission status: Observation.   Rise Patience MD [facility] Pager [phone].  If 7PM-7AM, please contact night-coverage [email or web address] Password [email or web address]  03/18/2020, 11:46 PM

## 2020-03-19 ENCOUNTER — Observation Stay (HOSPITAL_COMMUNITY): Payer: Medicare HMO

## 2020-03-19 DIAGNOSIS — R29818 Other symptoms and signs involving the nervous system: Secondary | ICD-10-CM | POA: Diagnosis not present

## 2020-03-19 DIAGNOSIS — I4811 Longstanding persistent atrial fibrillation: Secondary | ICD-10-CM | POA: Diagnosis not present

## 2020-03-19 DIAGNOSIS — M48061 Spinal stenosis, lumbar region without neurogenic claudication: Secondary | ICD-10-CM | POA: Diagnosis not present

## 2020-03-19 DIAGNOSIS — I10 Essential (primary) hypertension: Secondary | ICD-10-CM | POA: Diagnosis not present

## 2020-03-19 DIAGNOSIS — R531 Weakness: Secondary | ICD-10-CM

## 2020-03-19 DIAGNOSIS — R9082 White matter disease, unspecified: Secondary | ICD-10-CM | POA: Diagnosis not present

## 2020-03-19 DIAGNOSIS — R509 Fever, unspecified: Secondary | ICD-10-CM | POA: Diagnosis not present

## 2020-03-19 DIAGNOSIS — M50121 Cervical disc disorder at C4-C5 level with radiculopathy: Secondary | ICD-10-CM | POA: Diagnosis not present

## 2020-03-19 DIAGNOSIS — M5136 Other intervertebral disc degeneration, lumbar region: Secondary | ICD-10-CM | POA: Diagnosis not present

## 2020-03-19 DIAGNOSIS — I7090 Unspecified atherosclerosis: Secondary | ICD-10-CM | POA: Diagnosis not present

## 2020-03-19 DIAGNOSIS — M4802 Spinal stenosis, cervical region: Secondary | ICD-10-CM | POA: Diagnosis not present

## 2020-03-19 DIAGNOSIS — I700 Atherosclerosis of aorta: Secondary | ICD-10-CM | POA: Diagnosis not present

## 2020-03-19 LAB — URINALYSIS, ROUTINE W REFLEX MICROSCOPIC
Bilirubin Urine: NEGATIVE
Glucose, UA: NEGATIVE mg/dL
Hgb urine dipstick: NEGATIVE
Ketones, ur: NEGATIVE mg/dL
Leukocytes,Ua: NEGATIVE
Nitrite: NEGATIVE
Protein, ur: NEGATIVE mg/dL
Specific Gravity, Urine: 1.02 (ref 1.005–1.030)
pH: 5 (ref 5.0–8.0)

## 2020-03-19 LAB — COMPREHENSIVE METABOLIC PANEL
ALT: 14 U/L (ref 0–44)
AST: 15 U/L (ref 15–41)
Albumin: 3.2 g/dL — ABNORMAL LOW (ref 3.5–5.0)
Alkaline Phosphatase: 59 U/L (ref 38–126)
Anion gap: 7 (ref 5–15)
BUN: 19 mg/dL (ref 8–23)
CO2: 28 mmol/L (ref 22–32)
Calcium: 9.7 mg/dL (ref 8.9–10.3)
Chloride: 102 mmol/L (ref 98–111)
Creatinine, Ser: 0.87 mg/dL (ref 0.61–1.24)
GFR, Estimated: >60 mL/min (ref >60)
Glucose, Bld: 154 mg/dL — ABNORMAL HIGH (ref 70–99)
Potassium: 3.8 mmol/L (ref 3.5–5.1)
Sodium: 137 mmol/L (ref 135–145)
Total Bilirubin: 1.4 mg/dL — ABNORMAL HIGH (ref 0.3–1.2)
Total Protein: 6.6 g/dL (ref 6.5–8.1)

## 2020-03-19 LAB — CBC
HCT: 34 % — ABNORMAL LOW (ref 39.0–52.0)
Hemoglobin: 10.7 g/dL — ABNORMAL LOW (ref 13.0–17.0)
MCH: 28.2 pg (ref 26.0–34.0)
MCHC: 31.5 g/dL (ref 30.0–36.0)
MCV: 89.7 fL (ref 80.0–100.0)
Platelets: 143 10*3/uL — ABNORMAL LOW (ref 150–400)
RBC: 3.79 MIL/uL — ABNORMAL LOW (ref 4.22–5.81)
RDW: 15.3 % (ref 11.5–15.5)
WBC: 9.1 10*3/uL (ref 4.0–10.5)
nRBC: 0 % (ref 0.0–0.2)

## 2020-03-19 LAB — CBG MONITORING, ED
Glucose-Capillary: 158 mg/dL — ABNORMAL HIGH (ref 70–99)
Glucose-Capillary: 185 mg/dL — ABNORMAL HIGH (ref 70–99)

## 2020-03-19 LAB — PROTIME-INR
INR: 2.4 — ABNORMAL HIGH (ref 0.8–1.2)
Prothrombin Time: 25.7 seconds — ABNORMAL HIGH (ref 11.4–15.2)

## 2020-03-19 LAB — GLUCOSE, CAPILLARY
Glucose-Capillary: 116 mg/dL — ABNORMAL HIGH (ref 70–99)
Glucose-Capillary: 136 mg/dL — ABNORMAL HIGH (ref 70–99)

## 2020-03-19 LAB — TSH: TSH: 1.391 u[IU]/mL (ref 0.350–4.500)

## 2020-03-19 MED ORDER — SODIUM CHLORIDE 0.9 % IV SOLN
1.0000 g | INTRAVENOUS | Status: DC
  Administered 2020-03-19: 1 g via INTRAVENOUS
  Filled 2020-03-19: qty 1

## 2020-03-19 MED ORDER — WARFARIN SODIUM 5 MG PO TABS
5.0000 mg | ORAL_TABLET | Freq: Once | ORAL | Status: AC
  Administered 2020-03-19: 5 mg via ORAL
  Filled 2020-03-19 (×2): qty 1

## 2020-03-19 MED ORDER — FENTANYL CITRATE (PF) 100 MCG/2ML IJ SOLN
25.0000 ug | Freq: Once | INTRAMUSCULAR | Status: AC
  Administered 2020-03-19: 25 ug via INTRAVENOUS
  Filled 2020-03-19: qty 2

## 2020-03-19 MED ORDER — INSULIN ASPART 100 UNIT/ML ~~LOC~~ SOLN
0.0000 [IU] | Freq: Three times a day (TID) | SUBCUTANEOUS | Status: DC
  Administered 2020-03-19 (×2): 2 [IU] via SUBCUTANEOUS
  Administered 2020-03-20: 3 [IU] via SUBCUTANEOUS
  Administered 2020-03-20: 1 [IU] via SUBCUTANEOUS
  Administered 2020-03-20: 2 [IU] via SUBCUTANEOUS
  Administered 2020-03-21: 3 [IU] via SUBCUTANEOUS
  Administered 2020-03-21: 1 [IU] via SUBCUTANEOUS
  Administered 2020-03-21: 2 [IU] via SUBCUTANEOUS
  Administered 2020-03-22: 1 [IU] via SUBCUTANEOUS
  Administered 2020-03-22 – 2020-03-23 (×2): 2 [IU] via SUBCUTANEOUS
  Administered 2020-03-23 (×2): 1 [IU] via SUBCUTANEOUS
  Administered 2020-03-24 – 2020-03-25 (×5): 2 [IU] via SUBCUTANEOUS
  Administered 2020-03-25: 3 [IU] via SUBCUTANEOUS
  Administered 2020-03-26 (×3): 2 [IU] via SUBCUTANEOUS
  Administered 2020-03-27: 1 [IU] via SUBCUTANEOUS
  Administered 2020-03-27: 3 [IU] via SUBCUTANEOUS
  Filled 2020-03-19: qty 0.09

## 2020-03-19 MED ORDER — WARFARIN - PHARMACIST DOSING INPATIENT: Freq: Every day | Status: DC

## 2020-03-19 NOTE — Plan of Care (Signed)
  Problem: Clinical Measurements: Goal: Ability to maintain clinical measurements within normal limits will improve Outcome: Progressing   Problem: Nutrition: Goal: Adequate nutrition will be maintained Outcome: Progressing   Problem: Pain Managment: Goal: General experience of comfort will improve Outcome: Progressing   Problem: Safety: Goal: Ability to remain free from injury will improve Outcome: Progressing   Problem: Skin Integrity: Goal: Risk for impaired skin integrity will decrease Outcome: Progressing   

## 2020-03-19 NOTE — ED Notes (Signed)
Patient transported to MRI 

## 2020-03-19 NOTE — Progress Notes (Signed)
[facility]  PROGRESS NOTE  Matthew Edwards TSV:[id number] DOB: 1940-10-04 DOA: 03/18/2020 PCP: Lind Covert, MD   Brief HPI:   79 year old male with a history of CAD s/p CABG, stenting, atrial fibrillation, chronic incontinence of bowels after surgery in 2017 for polyp, aortic valve replacement with pericardial tissue, hypertension, dementia was brought to the ED after he was getting weaker at home.  Patient fell about a month ago since that time he has been having pain in the neck area.  In the ED he was febrile with temperature 101, COVID-19 is negative.  UA is negative.  Chest x-ray negative for infiltrate. CT head was unremarkable, CT lumbar spine showed spinal stenosis L3-4 L4-5 MRI cervical spine shows multiple disc degenerative disease    Subjective   Patient seen and examined, still complains of weakness of lower extremities.  Though he is able to move his extremities.   Assessment/Plan:     1. Bilateral lower extremity weakness-patient has no focal deficit on examination, CT head, lumbar spine unremarkable.  MRI C-spine shows multilevel disc degenerative disease.  Called and discussed with neurosurgery PA for Dr. Vertell Limber, he will see patient in consultation.  He recommends getting MRI lumbar spine as well as x-ray of lumbar spine 4 views.  Patient likely has spinal stenosis contributing to patient's bilateral lower extremity weakness. 2. Fever-patient had temperature of 101 F, unclear etiology.  UA is clear, chest x-ray is clear.  WBC 9.1.  COVID-19 is negative.  We will continue to monitor.  Tylenol as needed for fever. 3. Diabetes mellitus type 2-continue sliding scale insulin with NovoLog. 4. Atrial fibrillation-heart rate is controlled, continue metoprolol, Coumadin. 5. CAD s/p stent placement/CABG-continue Coumadin, metoprolol 6. Chronic incontinence of bowel after surgery 7. Dementia-no behavior disturbance, continue donepezil     COVID-19 Labs  No  results for input(s): DDIMER, FERRITIN, LDH, CRP in the last 72 hours.  Lab Results  Component Value Date   SARSCOV2NAA NEGATIVE 03/18/2020   [place] NEGATIVE 01/10/2020   [place] NEGATIVE 01/03/2020   [place] NEGATIVE 01/12/2019     Scheduled medications:   . atorvastatin  40 mg Oral q1800  . donepezil  5 mg Oral QHS  . insulin aspart  0-9 Units Subcutaneous TID WC  . metoprolol succinate  75 mg Oral Daily  . mometasone-formoterol  2 puff Inhalation BID  . warfarin  5 mg Oral ONCE-1600  . Warfarin - Pharmacist Dosing Inpatient   Does not apply [id number]         CBG: Recent Labs  Lab 03/18/20 1936 03/19/20 0802 03/19/20 1212  GLUCAP 163* 158* 185*    SpO2: 93 %    CBC: Recent Labs  Lab 03/18/20 2001 03/19/20 0500  WBC 10.5 9.1  NEUTROABS 8.1*  --   HGB 12.8* 10.7*  HCT 40.9 34.0*  MCV 89.5 89.7  PLT 159 143*    Basic Metabolic Panel: Recent Labs  Lab 03/18/20 2001 03/19/20 0500  NA 136 137  K 4.2 3.8  CL 97* 102  CO2 28 28  GLUCOSE 152* 154*  BUN 21 19  CREATININE 0.96 0.87  CALCIUM 10.4* 9.7     Liver Function Tests: Recent Labs  Lab 03/19/20 0500  AST 15  ALT 14  ALKPHOS 59  BILITOT 1.4*  PROT 6.6  ALBUMIN 3.2*     Antibiotics: Anti-infectives (From admission, onward)   Start     Dose/Rate Route Frequency Ordered Stop   03/19/20 2200  cefTRIAXone (ROCEPHIN) 1 g  in sodium chloride 0.9 % 100 mL IVPB        1 g 200 mL/hr over 30 Minutes Intravenous Every 24 hours 03/19/20 0626     03/18/20 2300  cefTRIAXone (ROCEPHIN) 1 g in sodium chloride 0.9 % 100 mL IVPB        1 g 200 mL/hr over 30 Minutes Intravenous  Once 03/18/20 2250 03/18/20 2352       DVT prophylaxis: Coumadin  Code Status: Full code  Family Communication: No family at bedside   Consultants:  Neurosurgery    Objective   Vitals:   03/19/20 1200 03/19/20 1300 03/19/20 1400 03/19/20 1510  BP: 130/82 120/78 120/78 104/84  Pulse:  88 88 79   Resp:  18 18 (!) 23  Temp:      TempSrc:      SpO2:  94% 94% 93%  Weight:      Height:        Intake/Output Summary (Last 24 hours) at 03/19/2020 1615 Last data filed at 03/19/2020 1156 Gross per 24 hour  Intake 1000 ml  Output --  Net 1000 ml    No intake/output data recorded.  Filed Weights   03/18/20 1929  Weight: 87.8 kg    Physical Examination:   General-appears in no acute distress Heart-S1-S2, regular, no murmur auscultated Lungs-clear to auscultation bilaterally, no wheezing or crackles auscultated Abdomen-soft, nontender, no organomegaly Extremities-no edema in the lower extremities Neuro-alert, oriented x3, no focal deficit noted  Status is: Inpatient  Dispo: The patient is from: Home              Anticipated d/c is to: Home versus skilled nursing facility              Anticipated d/c date is: 03/21/2020              Patient currently not medically stable for discharge  Barrier to discharge-ongoing evaluation for lower extremity weakness            Data Reviewed:   Recent Results (from the past 240 hour(s))  Culture, blood (Routine x 2)     Status: None (Preliminary result)   Collection Time: 03/18/20  7:45 PM   Specimen: BLOOD  Result Value Ref Range Status   Specimen Description   Final    BLOOD RIGHT ANTECUBITAL Performed at [facility] [address], [place], [place] [address]    Special Requests   Final    BOTTLES DRAWN AEROBIC AND ANAEROBIC Blood Culture results may not be optimal due to an excessive volume of blood received in culture bottles Performed at [facility] [address], [place], [place] [address]    Culture   Final    NO GROWTH < 12 HOURS Performed at [facility] [address], [place], [place] [address]    Report Status PENDING  Incomplete  Culture, blood (Routine x 2)     Status: None (Preliminary result)   Collection Time: 03/18/20  8:00 PM   Specimen: BLOOD  RIGHT FOREARM  Result Value Ref Range Status   Specimen Description   Final    BLOOD RIGHT FOREARM Performed at [facility], [place] [address] [place], [place] [address]    Special Requests   Final    BOTTLES DRAWN AEROBIC AND ANAEROBIC Blood Culture adequate volume Performed at [facility] [address] [place], [place] [address]    Culture   Final  NO GROWTH < 12 HOURS Performed at [facility] [address] [place], [place] [address]    Report Status PENDING  Incomplete  Respiratory Panel by RT PCR (Flu A&B, Covid) - Nasopharyngeal Swab     Status: None   Collection Time: 03/18/20  8:22 PM   Specimen: Nasopharyngeal Swab  Result Value Ref Range Status   SARS Coronavirus 2 by RT PCR NEGATIVE NEGATIVE Final    Comment: (NOTE) SARS-CoV-2 target nucleic acids are NOT DETECTED.  The SARS-CoV-2 RNA is generally detectable in upper respiratoy specimens during the acute phase of infection. The lowest concentration of SARS-CoV-2 viral copies this assay can detect is 131 copies/mL. A negative result does not preclude SARS-Cov-2 infection and should not be used as the sole basis for treatment or other patient management decisions. A negative result may occur with  improper specimen collection/handling, submission of specimen other than nasopharyngeal swab, presence of viral mutation(s) within the areas targeted by this assay, and inadequate number of viral copies (<131 copies/mL). A negative result must be combined with clinical observations, patient history, and epidemiological information. The expected result is Negative.  Fact Sheet for Patients:  PinkCheek.be  Fact Sheet for Healthcare Providers:  GravelBags.it  This test is no t yet approved or cleared by the [place] FDA and  has been authorized for detection and/or diagnosis of SARS-CoV-2 by FDA under an Emergency Use  Authorization (EUA). This EUA will remain  in effect (meaning this test can be used) for the duration of the COVID-19 declaration under Section 564(b)(1) of the Act, 21 U.S.C. section 360bbb-3(b)(1), unless the authorization is terminated or revoked sooner.     Influenza A by PCR NEGATIVE NEGATIVE Final   Influenza B by PCR NEGATIVE NEGATIVE Final    Comment: (NOTE) The Xpert Xpress SARS-CoV-2/FLU/RSV assay is intended as an aid in  the diagnosis of influenza from Nasopharyngeal swab specimens and  should not be used as a sole basis for treatment. Nasal washings and  aspirates are unacceptable for Xpert Xpress SARS-CoV-2/FLU/RSV  testing.  Fact Sheet for Patients: PinkCheek.be  Fact Sheet for Healthcare Providers: GravelBags.it  This test is not yet approved or cleared by the [place] FDA and  has been authorized for detection and/or diagnosis of SARS-CoV-2 by  FDA under an Emergency Use Authorization (EUA). This EUA will remain  in effect (meaning this test can be used) for the duration of the  Covid-19 declaration under Section 564(b)(1) of the Act, 21  U.S.C. section 360bbb-3(b)(1), unless the authorization is  terminated or revoked. Performed at [facility], [place] [address], [place], [place] [address]      Studies:  DG Chest 2 View  Result Date: 03/18/2020 CLINICAL DATA:  Lower extremity weakness. EXAM: CHEST - 2 VIEW COMPARISON:  December 27, 2017 FINDINGS: Multiple sternal wires and vascular clips are present. A trace amount of atelectasis is seen along the lateral aspect of the right lung base. Very mild blunting of the left costophrenic angle is seen. No pneumothorax is identified. The cardiac silhouette is mildly enlarged. An artificial cardiac valve is seen. The visualized skeletal structures are unremarkable. IMPRESSION: 1. Evidence of prior median sternotomy/CABG. 2. Very mild right basilar  atelectasis with a very small left pleural effusion. Electronically Signed   By: Virgina Norfolk M.D.   On: 03/18/2020 19:55   CT HEAD WO CONTRAST  Result Date: 03/19/2020 CLINICAL DATA:  79 year old male with neurologic deficit, weakness in both legs. EXAM:  CT HEAD WITHOUT CONTRAST TECHNIQUE: Contiguous axial images were obtained from the base of the skull through the vertex without intravenous contrast. COMPARISON:  Brain MRI 03/12/2017.  Head CT 01/02/2020. FINDINGS: Brain: Stable cerebral volume. No midline shift, ventriculomegaly, mass effect, evidence of mass lesion, intracranial hemorrhage or evidence of cortically based acute infarction. Patchy and confluent bilateral white matter hypodensity is stable. No cortical encephalomalacia. Vascular: Calcified atherosclerosis at the skull base. No suspicious intracranial vascular hyperdensity. Skull: No acute osseous abnormality identified. Sinuses/Orbits: Visualized paranasal sinuses and mastoids are stable and well pneumatized. Other: Visualized orbits and scalp soft tissues are within normal limits. IMPRESSION: No acute intracranial abnormality. Stable chronic white matter changes most commonly due to small vessel disease. Electronically Signed   By: Genevie Ann M.D.   On: 03/19/2020 02:29   CT LUMBAR SPINE WO CONTRAST  Result Date: 03/19/2020 CLINICAL DATA:  79 year old male with low back pain, weakness in both legs. EXAM: CT LUMBAR SPINE WITHOUT CONTRAST TECHNIQUE: Multidetector CT imaging of the lumbar spine was performed without intravenous contrast administration. Multiplanar CT image reconstructions were also generated. COMPARISON:  CT Abdomen and Pelvis 12/27/2017. Lumbar MRI 09/19/2015. FINDINGS: Segmentation: Judging from hypoplastic ribs designated the T12 level, the L5 level is mostly sacralized. This differs from the numbering system used on the 2017 MRI (where S1 was designated as the transitional segment). Correlation with radiographs is  recommended prior to any operative intervention. Alignment: Stable lumbar lordosis since 2017. No spondylolisthesis. Subtle levoconvex lumbar scoliosis. Vertebrae: Chronic L2 inferior endplate Schmorl's node is stable since 2017. No acute osseous abnormality identified. There is chronic SI joint ankylosis. Paraspinal and other soft tissues: Negative visible lung bases. Aortoiliac calcified atherosclerosis. Negative visible other abdominal viscera. Negative lumbar paraspinal soft tissues. Disc levels: Lower spinal degeneration does not appear significantly progressed since 2017, and is notable for: T10-T11: Vacuum disc and small vacuum containing Schmorl's node. L2-L3: Bulky far lateral disc osteophyte complex. Chronic Schmorl's node. Moderate facet hypertrophy. Mild to moderate bilateral L2 foraminal stenosis. L3-L4: Circumferential disc bulge with bulky endplate and facet spurring. Chronic epidural lipomatosis. Moderate spinal stenosis and mild to moderate left foraminal stenosis. L4-L5: Severe chronic facet arthropathy on the right. Chronic vacuum facet on that side. Superimposed circumferential disc bulge and endplate spurring eccentric to the right. Chronic epidural lipomatosis. Moderate spinal stenosis. Mild if any foraminal stenosis. L5-S1: Mostly sacralized, negative. IMPRESSION: 1. No acute osseous abnormality in the lumbar spine. 2. Transitional anatomy with a mostly sacralized L5 level suspected. This differs from the numbering system used on a 2017 MRI. 3. Advanced lower spinal degeneration appears stable since 2017, with multifactorial moderate spinal stenosis at L3-L4 and L4-L5. 4. Chronic SI joint ankylosis. 5. Aortic Atherosclerosis (ICD10-I70.0). Electronically Signed   By: Genevie Ann M.D.   On: 03/19/2020 02:25   MR CERVICAL SPINE WO CONTRAST  Result Date: 03/19/2020 CLINICAL DATA:  Weakness and lower extremities. Cervical radiculopathy. Febrile illness. EXAM: MRI CERVICAL SPINE WITHOUT CONTRAST  TECHNIQUE: Multiplanar, multisequence MR imaging of the cervical spine was performed. No intravenous contrast was administered. COMPARISON:  CT cervical spine 01/10/2019 FINDINGS: Examination is quite limited due to patient motion. Alignment: Overall normal alignment. Vertebrae: Advanced degenerative cervical spondylosis with multilevel disc disease and facet disease. No acute bony findings or worrisome bone lesion. No destructive bony changes. I do not see any findings suspicious for discitis or osteomyelitis. Cord: Cord narrowing/thinning at C4-5 without definite cord signal abnormality or cord lesion. However, exam is limited by patient motion.  Posterior Fossa, vertebral arteries, paraspinal tissues: No significant findings. Disc levels: C2-3: No significant findings. C3-4: Bulging degenerated annulus, osteophytic ridging and uncinate spurring with flattening of the ventral thecal sac and mild right and moderate left foraminal stenosis. C4-5: Bulging degenerated annulus, moderate-sized central disc protrusion and osteophytic ridging contributing to significant spinal and moderate right foraminal stenosis. Mild left foraminal stenosis also. C5-6: Advanced degenerative disc disease and facet disease. Bulging degenerated annulus, osteophytic ridging and uncinate spurring with flattening of the ventral thecal sac and narrowing the ventral CSF space. Mild foraminal stenosis bilaterally. C6-7: Bulging degenerated annulus, osteophytic ridging and uncinate spurring. Flattening of the ventral thecal sac and narrowing the ventral CSF space. No foraminal stenosis. C7-T1: Advanced facet disease but no disc protrusions, spinal or foraminal stenosis. IMPRESSION: 1. Advanced degenerative cervical spondylosis with multilevel disc disease and facet disease. 2. No findings for discitis or osteomyelitis. 3. Significant spinal and moderate right foraminal stenosis at C4-5. 4. Mild spinal and bilateral foraminal stenosis at C5-6. 5.  Mild right and moderate left foraminal stenosis at C3-4. Electronically Signed   By: Marijo Sanes M.D.   On: 03/19/2020 12:16       [place]   [facility] If 7PM-7AM, please contact night-coverage at [email or web address], Office  [phone]   03/19/2020, 4:15 PM  LOS: 0 days

## 2020-03-19 NOTE — ED Notes (Signed)
Pt is still unable to provide urine sample at this time. 

## 2020-03-19 NOTE — Progress Notes (Signed)
Delay in patient care. Previous MRI completed today (motion degraded). Spoke with RN regarding meds. RN to call once meds are ready?

## 2020-03-19 NOTE — Progress Notes (Signed)
ANTICOAGULATION CONSULT NOTE - Initial Consult  Pharmacy Consult for warfarin Indication: atrial fibrillation  Allergies  Allergen Reactions  . Pineapple Concentrate Nausea And Vomiting    Patient Measurements: Height: 5\' 9"  (175.3 cm) Weight: 87.8 kg (193 lb 9.6 oz) IBW/kg (Calculated) : 70.7   Vital Signs: Temp: 101 F (38.3 C) (10/18 1927) Temp Source: Oral (10/18 1927) BP: 111/62 (10/19 0036) Pulse Rate: 71 (10/19 0036)  Labs: Recent Labs    03/18/20 2001  HGB 12.8*  HCT 40.9  PLT 159  LABPROT 24.1*  INR 2.3*  CREATININE 0.96    Estimated Creatinine Clearance: 71.8 mL/min (by C-G formula based on SCr of 0.96 mg/dL).   Medical History: Past Medical History:  Diagnosis Date  . Arthritis   . Atrial fibrillation ([place])   . CAD (coronary artery disease) 2012   [place]-LAD 2 or 3 srents  . Complication of anesthesia    "loopy after polpy removal dec 2017, lasted several days"  . COPD (chronic obstructive pulmonary disease) ([place])   . Dementia ([place])   . GERD (gastroesophageal reflux disease)   . HEART FAILURE, CONGESTIVE UNSPEC 02/23/2007   Qualifier: Diagnosis of  By: Mellody Drown MD, [facility]    . Hyperlipidemia   . Hypertension   . OSA (obstructive sleep apnea) 11/13/2014  . S/P AVR    #25 mm Edwards pericardial valve Bioprosthetic. Dr Cyndia Bent.  . Situational depression    "son passed 11/2013"  . Sleep apnea    occ uses c pap does not know settings   . Stented coronary artery 2013  . TOBACCO DEPENDENCE 07/29/2006   Qualifier: History of  By: Carlena Sax  MD, [place] [place]    . Type II diabetes mellitus [facility])      Assessment: 79 yo male to ED with weakness.  Currently being treated with amoxil for UTI.  Pt takes warfarin for hx of Afib.  Dose is 5mg  daily, last dose 10/18 @ 1800  10/18 CBC WNL INR 2.3, therapeutic  Goal of Therapy:  INR 2-3   Plan:  Warfarin 5mg  today at 1600 Daily INR  [place] 03/19/2020, 1:31 AM

## 2020-03-19 NOTE — Plan of Care (Signed)
  Problem: Education: Goal: Knowledge of General Education information will improve Description Including pain rating scale, medication(s)/side effects and non-pharmacologic comfort measures Outcome: Progressing   

## 2020-03-20 ENCOUNTER — Observation Stay (HOSPITAL_COMMUNITY): Payer: Medicare HMO

## 2020-03-20 DIAGNOSIS — I4811 Longstanding persistent atrial fibrillation: Secondary | ICD-10-CM | POA: Diagnosis not present

## 2020-03-20 DIAGNOSIS — I5032 Chronic diastolic (congestive) heart failure: Secondary | ICD-10-CM | POA: Diagnosis present

## 2020-03-20 DIAGNOSIS — M1990 Unspecified osteoarthritis, unspecified site: Secondary | ICD-10-CM | POA: Diagnosis present

## 2020-03-20 DIAGNOSIS — F0390 Unspecified dementia without behavioral disturbance: Secondary | ICD-10-CM | POA: Diagnosis present

## 2020-03-20 DIAGNOSIS — N390 Urinary tract infection, site not specified: Secondary | ICD-10-CM | POA: Diagnosis present

## 2020-03-20 DIAGNOSIS — Z8249 Family history of ischemic heart disease and other diseases of the circulatory system: Secondary | ICD-10-CM | POA: Diagnosis not present

## 2020-03-20 DIAGNOSIS — J449 Chronic obstructive pulmonary disease, unspecified: Secondary | ICD-10-CM | POA: Diagnosis present

## 2020-03-20 DIAGNOSIS — R509 Fever, unspecified: Secondary | ICD-10-CM | POA: Diagnosis not present

## 2020-03-20 DIAGNOSIS — D649 Anemia, unspecified: Secondary | ICD-10-CM | POA: Diagnosis present

## 2020-03-20 DIAGNOSIS — R531 Weakness: Secondary | ICD-10-CM

## 2020-03-20 DIAGNOSIS — Z7901 Long term (current) use of anticoagulants: Secondary | ICD-10-CM | POA: Diagnosis not present

## 2020-03-20 DIAGNOSIS — E1140 Type 2 diabetes mellitus with diabetic neuropathy, unspecified: Secondary | ICD-10-CM

## 2020-03-20 DIAGNOSIS — I10 Essential (primary) hypertension: Secondary | ICD-10-CM | POA: Diagnosis not present

## 2020-03-20 DIAGNOSIS — Z953 Presence of xenogenic heart valve: Secondary | ICD-10-CM | POA: Diagnosis not present

## 2020-03-20 DIAGNOSIS — I110 Hypertensive heart disease with heart failure: Secondary | ICD-10-CM | POA: Diagnosis present

## 2020-03-20 DIAGNOSIS — M48061 Spinal stenosis, lumbar region without neurogenic claudication: Secondary | ICD-10-CM | POA: Diagnosis not present

## 2020-03-20 DIAGNOSIS — K219 Gastro-esophageal reflux disease without esophagitis: Secondary | ICD-10-CM | POA: Diagnosis present

## 2020-03-20 DIAGNOSIS — M6281 Muscle weakness (generalized): Secondary | ICD-10-CM | POA: Diagnosis present

## 2020-03-20 DIAGNOSIS — E871 Hypo-osmolality and hyponatremia: Secondary | ICD-10-CM | POA: Diagnosis not present

## 2020-03-20 DIAGNOSIS — R262 Difficulty in walking, not elsewhere classified: Secondary | ICD-10-CM | POA: Diagnosis present

## 2020-03-20 DIAGNOSIS — I480 Paroxysmal atrial fibrillation: Secondary | ICD-10-CM | POA: Diagnosis present

## 2020-03-20 DIAGNOSIS — M5127 Other intervertebral disc displacement, lumbosacral region: Secondary | ICD-10-CM | POA: Diagnosis not present

## 2020-03-20 DIAGNOSIS — E785 Hyperlipidemia, unspecified: Secondary | ICD-10-CM | POA: Diagnosis present

## 2020-03-20 DIAGNOSIS — R159 Full incontinence of feces: Secondary | ICD-10-CM | POA: Diagnosis present

## 2020-03-20 DIAGNOSIS — G4733 Obstructive sleep apnea (adult) (pediatric): Secondary | ICD-10-CM | POA: Diagnosis present

## 2020-03-20 DIAGNOSIS — M47816 Spondylosis without myelopathy or radiculopathy, lumbar region: Secondary | ICD-10-CM | POA: Diagnosis not present

## 2020-03-20 DIAGNOSIS — Z951 Presence of aortocoronary bypass graft: Secondary | ICD-10-CM | POA: Diagnosis not present

## 2020-03-20 DIAGNOSIS — Z20822 Contact with and (suspected) exposure to covid-19: Secondary | ICD-10-CM | POA: Diagnosis present

## 2020-03-20 DIAGNOSIS — I2510 Atherosclerotic heart disease of native coronary artery without angina pectoris: Secondary | ICD-10-CM | POA: Diagnosis present

## 2020-03-20 LAB — COMPREHENSIVE METABOLIC PANEL
ALT: 15 U/L (ref 0–44)
AST: 24 U/L (ref 15–41)
Albumin: 3.1 g/dL — ABNORMAL LOW (ref 3.5–5.0)
Alkaline Phosphatase: 59 U/L (ref 38–126)
Anion gap: 11 (ref 5–15)
BUN: 17 mg/dL (ref 8–23)
CO2: 27 mmol/L (ref 22–32)
Calcium: 9.8 mg/dL (ref 8.9–10.3)
Chloride: 98 mmol/L (ref 98–111)
Creatinine, Ser: 0.81 mg/dL (ref 0.61–1.24)
GFR, Estimated: >60 mL/min (ref >60)
Glucose, Bld: 133 mg/dL — ABNORMAL HIGH (ref 70–99)
Potassium: 3.7 mmol/L (ref 3.5–5.1)
Sodium: 136 mmol/L (ref 135–145)
Total Bilirubin: 1.4 mg/dL — ABNORMAL HIGH (ref 0.3–1.2)
Total Protein: 6.7 g/dL (ref 6.5–8.1)

## 2020-03-20 LAB — GLUCOSE, CAPILLARY
Glucose-Capillary: 122 mg/dL — ABNORMAL HIGH (ref 70–99)
Glucose-Capillary: 221 mg/dL — ABNORMAL HIGH (ref 70–99)
Glucose-Capillary: 162 mg/dL — ABNORMAL HIGH (ref 70–99)
Glucose-Capillary: 148 mg/dL — ABNORMAL HIGH (ref 70–99)
Glucose-Capillary: 173 mg/dL — ABNORMAL HIGH (ref 70–99)

## 2020-03-20 LAB — CBC
HCT: 34.7 % — ABNORMAL LOW (ref 39.0–52.0)
Hemoglobin: 11.1 g/dL — ABNORMAL LOW (ref 13.0–17.0)
MCH: 28.1 pg (ref 26.0–34.0)
MCHC: 32 g/dL (ref 30.0–36.0)
MCV: 87.8 fL (ref 80.0–100.0)
Platelets: 149 10*3/uL — ABNORMAL LOW (ref 150–400)
RBC: 3.95 MIL/uL — ABNORMAL LOW (ref 4.22–5.81)
RDW: 15.1 % (ref 11.5–15.5)
WBC: 8.5 10*3/uL (ref 4.0–10.5)
nRBC: 0 % (ref 0.0–0.2)

## 2020-03-20 LAB — PROTIME-INR
INR: 2.1 — ABNORMAL HIGH (ref 0.8–1.2)
Prothrombin Time: 23.1 seconds — ABNORMAL HIGH (ref 11.4–15.2)

## 2020-03-20 MED ORDER — WARFARIN SODIUM 5 MG PO TABS
5.0000 mg | ORAL_TABLET | Freq: Once | ORAL | Status: AC
  Administered 2020-03-20: 5 mg via ORAL
  Filled 2020-03-20: qty 1

## 2020-03-20 NOTE — Plan of Care (Signed)
  Problem: Clinical Measurements: Goal: Ability to maintain clinical measurements within normal limits will improve Outcome: Progressing Goal: Will remain free from infection Outcome: Progressing Goal: Diagnostic test results will improve Outcome: Progressing Goal: Respiratory complications will improve Outcome: Progressing Goal: Cardiovascular complication will be avoided Outcome: Progressing   Problem: Nutrition: Goal: Adequate nutrition will be maintained Outcome: Progressing   Problem: Safety: Goal: Ability to remain free from injury will improve Outcome: Progressing

## 2020-03-20 NOTE — Evaluation (Signed)
Occupational Therapy Evaluation Patient Details Name: Matthew Edwards MRN: [id number] DOB: 03-24-1941 Today's Date: 03/20/2020    History of Present Illness 79 y.o. male with history of CAD status post CABG and stenting, A. fib, chronic incontinence of bowels after surgery in 2017 for polyp, aortic valve replacement with pericardial tissue, hypertension, dementia was brought to the ER after patient's home health aide found the patient was getting increasingly weak over the last couple of days. Recent Dx of UTI. CT showed spinal stenosis L3-4 and L4-5, MRI of cervical spine showed degenerative disc disease.   Clinical Impression   Patient with functional deficits listed below impacting safety and independence with self care. Unsure of PLOF as patient is poor historian, per previous admit patient lives with DTR and was ambulatory with walker. Currently patient requires mod A with cues for sequencing for bed mobility, and unable to stand EOB with walker or stedy with x2 assist. Patient having difficulty with direction following, attention, safety awareness impacting efforts with sit to stand, minimal effort exerted to pull on stedy with multiple attempts to stand. Recommend continued acute OT to facilitate D/C to venue listed below.    Follow Up Recommendations  SNF;Supervision/Assistance - 24 hour    Equipment Recommendations  Other (comment) (TBD)       Precautions / Restrictions Precautions Precautions: Fall Restrictions Weight Bearing Restrictions: No      Mobility Bed Mobility Overal bed mobility: Needs Assistance Bed Mobility: Rolling;Supine to Sit;Sit to Supine Rolling: Mod assist   Supine to sit: Mod assist;HOB elevated Sit to supine: Mod assist;+2 for physical assistance;+2 for safety/equipment   General bed mobility comments: patient able to mobilize LEs to EOB, cues for problem solving and mod A to elevate trunk to sitting. Pt require assist to lift LEs onto bed and for  trunk control into bed.     Transfers Overall transfer level: Needs assistance Equipment used: Rolling walker (2 wheeled)             General transfer comment: unable, multiple attempts made with bed height elevated first with rolling walker however patient distractable unable to follow directions for hand and LE placement. RN arrive with stedy and with x2 assist patient unable to lift buttock from EOB and limited attempt from patient to pull with UEs     Balance Overall balance assessment: Needs assistance Sitting-balance support: Bilateral upper extremity supported;Feet supported Sitting balance-Leahy Scale: Poor Sitting balance - Comments: initial significant posterior lean, max cues for UE positioning progress to min G-close supervision for safety Postural control: Posterior lean     Standing balance comment: unable                           ADL either performed or assessed with clinical judgement   ADL Overall ADL's : Needs assistance/impaired     Grooming: Bed level;Minimal assistance   Upper Body Bathing: Sitting;Bed level;Moderate assistance   Lower Body Bathing: Total assistance;Sitting/lateral leans;Bed level   Upper Body Dressing : Sitting;Bed level;Moderate assistance   Lower Body Dressing: Total assistance;Sitting/lateral leans     Toilet Transfer Details (indicate cue type and reason): unable, attempted with x2 assist and use of stedy, patient not following directions appropriately and very limited physical effort to pull with arms on stedy to attempt standing Toileting- Clothing Manipulation and Hygiene: Total assistance;Bed level       Functional mobility during ADLs: Total assistance (unable to stand this session) General ADL Comments:  patient requiring increased assistance with self care due to decreased cognition, safety awareness, strength, activity tolerance, balance                  Pertinent Vitals/Pain Pain Assessment:  Faces Faces Pain Scale: No hurt     Hand Dominance Right   Extremity/Trunk Assessment Upper Extremity Assessment Upper Extremity Assessment: Generalized weakness   Lower Extremity Assessment Lower Extremity Assessment: Defer to PT evaluation       Communication Communication Communication: HOH   Cognition Arousal/Alertness: Awake/alert Behavior During Therapy: WFL for tasks assessed/performed Overall Cognitive Status: No family/caregiver present to determine baseline cognitive functioning                                 General Comments: patient unable to state year, place, situation. poor short term memory, told pt name of hospital three times during eval but unable to recall. patient pointing towards floor asking about guy down there, pt very distractable, poor problem solving, direction following, safety              Home Living Family/patient expects to be discharged to:: Private residence Living Arrangements: Children (pt reports DTR on dialysis) Available Help at Discharge: Family;Available PRN/intermittently Type of Home: Apartment Home Access: Stairs to enter Entrance Stairs-Number of Steps: 2 Entrance Stairs-Rails: Right;Left Home Layout: One level     Bathroom Shower/Tub: Teacher, early years/pre: Standard Bathroom Accessibility: Yes How Accessible: Accessible via walker Home Equipment: [place] - 2 wheels;Cane - single point   Additional Comments: info obtained from previous admission, patient confused      Prior Functioning/Environment Level of Independence: Needs assistance  Gait / Transfers Assistance Needed: utilizes rolling walker ADL's / Homemaking Assistance Needed: patient reports having care attendant to assist with bathing "but she got mad and stopped coming"    Comments: per chart review patient had home health aide, patient confused unable to provide accurate history        OT Problem List: Decreased  strength;Impaired balance (sitting and/or standing);Decreased activity tolerance;Decreased cognition;Decreased safety awareness      OT Treatment/Interventions: Self-care/ADL training;Therapeutic exercise;Energy conservation;DME and/or AE instruction;Therapeutic activities;Cognitive remediation/compensation;Patient/family education;Balance training    OT Goals(Current goals can be found in the care plan section) Acute Rehab OT Goals Patient Stated Goal: "are you taking me to church?" OT Goal Formulation: With patient Time For Goal Achievement: 04/03/20 Potential to Achieve Goals: Good  OT Frequency: Min 2X/week    AM-PAC OT "6 Clicks" Daily Activity     Outcome Measure Help from another person eating meals?: A Little Help from another person taking care of personal grooming?: A Little Help from another person toileting, which includes using toliet, bedpan, or urinal?: Total Help from another person bathing (including washing, rinsing, drying)?: A Lot Help from another person to put on and taking off regular upper body clothing?: A Lot Help from another person to put on and taking off regular lower body clothing?: Total 6 Click Score: 12   End of Session Equipment Utilized During Treatment: Gait belt;Rolling walker;Other (comment) (stedy) Nurse Communication: Mobility status  Activity Tolerance: Other (comment) (treatment limited secondary to cognition) Patient left: in bed;with call bell/phone within reach;with bed alarm set  OT Visit Diagnosis: Other abnormalities of gait and mobility (R26.89);Muscle weakness (generalized) (M62.81);History of falling (Z91.81);Other symptoms and signs involving cognitive function  Time: 7493-5521 OT Time Calculation (min): 26 min Charges:  OT General Charges $OT Visit: 1 Visit OT Evaluation $OT Eval Low Complexity: 1 Low OT Treatments $Self Care/Home Management : 8-22 mins  Delbert Phenix OT OT pager: Ethel 03/20/2020, 3:04 PM

## 2020-03-20 NOTE — Evaluation (Signed)
Physical Therapy Evaluation Patient Details Name: Matthew Edwards MRN: [id number] DOB: [birth year] Today's Date: 03/20/2020   History of Present Illness  79 y.o. male with history of CAD status post CABG and stenting, A. fib, chronic incontinence of bowels after surgery in 2017 for polyp, aortic valve replacement with pericardial tissue, hypertension, dementia was brought to the ER after patient's home health aide found the patient was getting increasingly weak over the last couple of days. Recent Dx of UTI. CT showed spinal stenosis L3-4 and L4-5, MRI of cervical spine showed degenerative disc disease.  Clinical Impression  Pt admitted with above diagnosis. Max assist for bed mobility, poor sitting balance at edge of bed. Attempted sit to stand with Stedy, however pt confused and unable to follow commands for hand placement. Pt oriented to self only. SNF recommended.  Pt currently with functional limitations due to the deficits listed below (see PT Problem List). Pt will benefit from skilled PT to increase their independence and safety with mobility to allow discharge to the venue listed below.       Follow Up Recommendations SNF;Supervision/Assistance - 24 hour    Equipment Recommendations  None recommended by PT    Recommendations for Other Services       Precautions / Restrictions Precautions Precautions: Fall Precaution Comments: pt admitted for a fall 01/01/20, pt unable to provide fall hx 2* dementia Restrictions Weight Bearing Restrictions: No      Mobility  Bed Mobility Overal bed mobility: Needs Assistance Bed Mobility: Rolling;Supine to Sit;Sit to Supine Rolling: Mod assist   Supine to sit: HOB elevated;Max assist Sit to supine: +2 for physical assistance;+2 for safety/equipment;Max assist   General bed mobility comments: assist to advance BLEs and to raise trunk, pt not able to follow commands with multimodal cues    Transfers Overall transfer level: Needs  assistance Equipment used: Rolling walker (2 wheeled)             General transfer comment: attempted sit to stand with Stedy but pt unable to follow commands for hand placement on bar (with repeated manual/verbal cues)  Ambulation/Gait                Stairs            Wheelchair Mobility    Modified Rankin (Stroke Patients Only)       Balance Overall balance assessment: Needs assistance Sitting-balance support: Bilateral upper extremity supported;Feet supported Sitting balance-Leahy Scale: Poor Sitting balance - Comments: initial significant posterior lean, max cues for UE positioning progress to min G-close supervision for safety Postural control: Posterior lean     Standing balance comment: unable                             Pertinent Vitals/Pain Pain Assessment: Faces Faces Pain Scale: No hurt    Home Living Family/patient expects to be discharged to:: Private residence Living Arrangements: Children (pt reports DTR on dialysis) Available Help at Discharge: Family;Available PRN/intermittently Type of Home: Apartment Home Access: Stairs to enter Entrance Stairs-Rails: Right;Left Entrance Stairs-Number of Steps: 2 Home Layout: One level Home Equipment: Walker - 2 wheels;Cane - single point Additional Comments: info obtained from previous admission, patient confused    Prior Function Level of Independence: Needs assistance   Gait / Transfers Assistance Needed: utilizes rolling walker  ADL's / Homemaking Assistance Needed: patient reports having care attendant to assist with bathing "but she got mad and stopped coming"  Comments: per chart review patient had home health aide, patient confused unable to provide accurate history     Hand Dominance   Dominant Hand: Right    Extremity/Trunk Assessment   Upper Extremity Assessment Upper Extremity Assessment: Defer to OT evaluation    Lower Extremity Assessment Lower Extremity  Assessment: Generalized weakness;Difficult to assess due to impaired cognition (pt did not follow commands for strength/ROM testing, even with verbal and manual cues)       Communication   Communication: HOH  Cognition Arousal/Alertness: Awake/alert Behavior During Therapy: WFL for tasks assessed/performed Overall Cognitive Status: No family/caregiver present to determine baseline cognitive functioning                                 General Comments: patient able to state his birthdate but unable to state current year, place, situation. poor short term memory,  patient pointing towards floor asking about guy down there, pt very distractable, poor problem solving, direction following, safety      General Comments      Exercises     Assessment/Plan    PT Assessment Patient needs continued PT services  PT Problem List Decreased knowledge of use of DME;Decreased cognition;Decreased activity tolerance;Decreased mobility;Decreased safety awareness       PT Treatment Interventions Therapeutic activities;Therapeutic exercise;Gait training;Functional mobility training;Patient/family education    PT Goals (Current goals can be found in the Care Plan section)  Acute Rehab PT Goals Patient Stated Goal: "Where's [name]?" PT Goal Formulation: Patient unable to participate in goal setting Time For Goal Achievement: 04/03/20 Potential to Achieve Goals: Fair    Frequency Min 2X/week   Barriers to discharge        Co-evaluation               AM-PAC PT "6 Clicks" Mobility  Outcome Measure Help needed turning from your back to your side while in a flat bed without using bedrails?: A Lot Help needed moving from lying on your back to sitting on the side of a flat bed without using bedrails?: Total Help needed moving to and from a bed to a chair (including a wheelchair)?: Total Help needed standing up from a chair using your arms (e.g., wheelchair or bedside chair)?:  Total Help needed to walk in hospital room?: Total Help needed climbing 3-5 steps with a railing? : Total 6 Click Score: 7    End of Session   Activity Tolerance: Patient tolerated treatment well Patient left: in bed;with call bell/phone within reach;with nursing/sitter in room;with bed alarm set Nurse Communication: Mobility status;Need for lift equipment PT Visit Diagnosis: Difficulty in walking, not elsewhere classified (R26.2)    Time: 6314-9702 PT Time Calculation (min) (ACUTE ONLY): 12 min   Charges:   PT Evaluation $PT Eval Moderate Complexity: 1 Mod        Philomena Doheny PT 03/20/2020  Acute Rehabilitation Services Pager [phone] Office [phone]

## 2020-03-20 NOTE — Progress Notes (Signed)
PROGRESS NOTE    Matthew Edwards  WIO:[id number] DOB: Jan 30, 1941 DOA: 03/18/2020 PCP: Lind Covert, MD   Brief Narrative:  HPI per Gean Birchwood on 03/18/20  Matthew Edwards is a 79 y.o. male with history of CAD status post CABG and stenting, A. fib, chronic incontinence of bowels after surgery in 2017 for polyp, aortic valve replacement with pericardial tissue, hypertension, dementia was brought to the ER after patient's home health aide found the patient was getting increasingly weak over the last couple of days.  About a month ago patient did have a fall and since then he was having some pain around the neck area.  Recently over the last 1 week patient was found to have some blood in the urine and patient's primary care physician had placed patient on antibiotics.  ED Course: In the ER patient had a fever of 101 F Covid test was negative.  UA still pending patient is refusing to get in and out cath.  On exam patient has good strength of the both lower extremity and good sensation.  CT head and CT lumbar spine was unremarkable.  Patient states he is feeling it difficult to ambulate because of the weakness.  Patient was empirically started on antibiotic despite patient not having any urine given since patient was having hematuria.  Patient admitted for weakness.  MRI C-spine is pending.  Labs are at baseline.  INR is therapeutic at 2.3.  Blood cultures were obtained along with urine studies ordered.  **Interim History Neurosurgery recommended further imaging which has now been done.  PT OT recommending SNF.  Still awaiting further neurosurgical evaluation recommendations and their review of the lumbar spine MRI and x-ray.  Assessment & Plan:   Principal Problem:   Weakness Active Problems:   Essential hypertension   Type 2 diabetes mellitus with diabetic neuropathy, unspecified (HCC)   Atrial fibrillation (HCC)   Spinal stenosis of lumbar region   Fever   Lower extremity  weakness  Bilateral Lower Extremity Weakness -Patient has no appreciable focal deficit on examination, CT head, lumbar spine unremarkable.   -MRI C-spine shows multilevel disc degenerative disease.   -My colleague Dr. Arcola Jansky and discussed with neurosurgery PA for Dr. Vertell Limber, he will see patient in consultation.  -He recommends getting MRI lumbar spine as well as x-ray of lumbar spine 4 views.  -Patient likely has spinal stenosis contributing to patient's bilateral lower extremity weakness. -MRI done and showed " Motion degraded study.  No visible conus abnormality. Congenitally narrow spinal canal with superimposed degenerative disease causing thecal sac compression at L4-5. No foraminal impingement." -Lumbar X-Ray showed "No fracture or spondylolisthesis is noted. Large osteophyte formation is noted at L3-4 and L4-5 no significant disc space narrowing is noted." -PT OT evaluating and recommending SNF  -Further evaluation and recommendations by Neurosurgery still pending  Fever -Patient had temperature of 101 F, unclear etiology but fever has resolved now.   -UA is unremarkable and clear -Chest x-ray is clear.   -Lactic acid level 1.5 -Initially was started on IV ceftriaxone for suspected UTI but is now been discontinued given that patient is afebrile has no leukocytosis and urinalysis is clear -WBC is within normal limits and is actually trended downward from 10.5 and is now 8.5 today.   -COVID-19 is negative.  -Blood cultures x2 show 2 days and respiratory virus panel was negative for influenza a and B and SARS-CoV-2 -We will continue to monitor closely and follow cultures.   -Continue  with acetaminophen for fever   Diabetes Mellitus Type 2 complicated by diabetic neuropathy -Continue sliding scale insulin with Sensitive NovoLog SSI AC -CBG's ranging from 116-185 -Adjust insulin regimen as necessary  Paroxysmal Atrial Fibrillation  -Heart rate is controlled,  -Continue  Metoprolol, Coumadin per Pharmacy dosing -Continue to Monitor on Telemetry   CAD status post CABG and stent placement History of aortic valve replacement Chronic diastolic CHF -Continue with atorvastatin 40 mg p.o. daily, Metoprolol Succinate 25 mg p.o. daily, as well as Coumadin per pharmacy dosing -Currently denies any chest pain; if patient has some chest pain will resume his nitroglycerin 1 tab every 5 minutes as needed for chest pain -Currently holding his home furosemide as needed as he is currently not volume overloaded -Continue to monitor for signs and symptoms of volume overload and continue with strict I's and O's and daily weights  HLD -C/w Atorvastatin 40 mg po qHS  COPD -Currently not in Exacerbation -C/w Mometasone-Formoterol 100-5 mcg/act 2 puff IH BID  Dementia -Currently has no behavioral disturbances -C/w Donepezil 5 mg po qHS  Hyperbilirubinemia -Mild as T Bili is 1.4 -Likely Reactive -Continue to Monitor and Trend; If remains elevated or worsening will obtain a RUQ U/S -Repeat CMP in the AM  Normocytic Anemia -Patient's Hgb/Hct went from 12.8/40.9 -> 10.7/34.0 -> 11.1/34.7 -Check Anemia Panel in the AM -Continue to Monitor for S/Sx of Bleeding; Currently no overt bleeding noted -Repeat CBC in the AM  Thrombocytopenia -Patient's Platelet Count went from 159 -> 143 -> 149 -Continue to Monitor for S/Sx of Bleeding -Currently no overt bleeding noted -Repeat CBC in the AM  Overweight -Estimated body mass index is 28.59 kg/m as calculated from the following:   Height as of this encounter: 5\' 9"  (1.753 m).   Weight as of this encounter: 87.8 kg. -Weight Loss and Dietary Counseling   DVT prophylaxis: Anticoagulated with Coumadin Code Status: FULL CODE Family Communication: None Disposition Plan: Pending further evaluation by neurosurgery and PT OT recommending skilled nursing facility so we will obtain TOC consultation  Status is: Inpatient  Remains  inpatient appropriate because:Unsafe d/c plan, IV treatments appropriate due to intensity of illness or inability to take PO and Inpatient level of care appropriate due to severity of illness   Dispo: The patient is from: Home              Anticipated d/c is to: SNF              Anticipated d/c date is: 2 days              Patient currently is not medically stable to d/c.   Consultants:   Neurosurgery Dr. Vertell Limber   Procedures: MRI  Antimicrobials:  Anti-infectives (From admission, onward)   Start     Dose/Rate Route Frequency Ordered Stop   03/19/20 2200  cefTRIAXone (ROCEPHIN) 1 g in sodium chloride 0.9 % 100 mL IVPB        1 g 200 mL/hr over 30 Minutes Intravenous Every 24 hours 03/19/20 0626     03/18/20 2300  cefTRIAXone (ROCEPHIN) 1 g in sodium chloride 0.9 % 100 mL IVPB        1 g 200 mL/hr over 30 Minutes Intravenous  Once 03/18/20 2250 03/18/20 2352        Subjective: Seen and examined at bedside and states that he is having some back pain still and extremely weak in his lower extremities.  States that he can barely wiggle his  toes.  No nausea or vomiting.  Felt uncomfortable and wanting to try and get out of the bed.  No other concerns or plans at this time.  Objective: Vitals:   03/20/20 0115 03/20/20 0529 03/20/20 0744 03/20/20 0813  BP: 117/86 107/73  117/83  Pulse: 78 86    Resp: 20 19    Temp: 98.2 F (36.8 C) 97.9 F (36.6 C)    TempSrc: Oral Oral    SpO2: 95% 92% 91%   Weight:      Height:        Intake/Output Summary (Last 24 hours) at 03/20/2020 0109 Last data filed at 03/20/2020 3235 Gross per 24 hour  Intake 1101 ml  Output 400 ml  Net 701 ml   Filed Weights   03/18/20 1929  Weight: 87.8 kg   Examination: Physical Exam:  Constitutional: WN/WD overweight African-American male currently and some mild distress and does appear a little bit uncomfortable. Eyes: Lids and conjunctivae normal, sclerae anicteric  ENMT: External Ears, Nose appear  normal. Grossly normal hearing.  Neck: Appears normal, supple, no cervical masses, normal ROM, no appreciable thyromegaly; no JVD Respiratory: Diminished to auscultation bilaterally with coarse breath, no wheezing, rales, rhonchi or crackles. Normal respiratory effort and patient is not tachypenic. No accessory muscle use.  Unlabored breathing Cardiovascular: RRR, no murmurs / rubs / gallops. S1 and S2 auscultated.  No appreciable extremity edema Abdomen: Soft, non-tender, distended secondary body habitus. Bowel sounds positive.  GU: Deferred. Musculoskeletal: No clubbing / cyanosis of digits/nails. No joint deformity upper and lower extremities.  Skin: No rashes, lesions, ulcers on limited skin evaluation. No induration; Warm and dry.  Neurologic: CN 2-12 grossly intact with no focal deficits.  Diminished strength in the lower extremities in the setting of his back pain.  Romberg sign cerebellar reflexes not assessed.  Psychiatric: Normal judgment and insight. Alert and oriented x 3. Normal mood and appropriate affect.   Data Reviewed: I have personally reviewed following labs and imaging studies  CBC: Recent Labs  Lab 03/18/20 2001 03/19/20 0500 03/20/20 0344  WBC 10.5 9.1 8.5  NEUTROABS 8.1*  --   --   HGB 12.8* 10.7* 11.1*  HCT 40.9 34.0* 34.7*  MCV 89.5 89.7 87.8  PLT 159 143* 573*   Basic Metabolic Panel: Recent Labs  Lab 03/18/20 2001 03/19/20 0500 03/20/20 0344  NA 136 137 136  K 4.2 3.8 3.7  CL 97* 102 98  CO2 28 28 27   GLUCOSE 152* 154* 133*  BUN 21 19 17   CREATININE 0.96 0.87 0.81  CALCIUM 10.4* 9.7 9.8   GFR: Estimated Creatinine Clearance: 85 mL/min (by C-G formula based on SCr of 0.81 mg/dL). Liver Function Tests: Recent Labs  Lab 03/19/20 0500 03/20/20 0344  AST 15 24  ALT 14 15  ALKPHOS 59 59  BILITOT 1.4* 1.4*  PROT 6.6 6.7  ALBUMIN 3.2* 3.1*   No results for input(s): LIPASE, AMYLASE in the last 168 hours. No results for input(s): AMMONIA in  the last 168 hours. Coagulation Profile: Recent Labs  Lab 03/18/20 2001 03/19/20 0500 03/20/20 0344  INR 2.3* 2.4* 2.1*   Cardiac Enzymes: No results for input(s): CKTOTAL, CKMB, CKMBINDEX, TROPONINI in the last 168 hours. BNP (last 3 results) No results for input(s): PROBNP in the last 8760 hours. HbA1C: No results for input(s): HGBA1C in the last 72 hours. CBG: Recent Labs  Lab 03/19/20 0802 03/19/20 1212 03/19/20 1810 03/19/20 2108 03/20/20 0731  GLUCAP 158* 185* 116*  136* 122*   Lipid Profile: No results for input(s): CHOL, HDL, LDLCALC, TRIG, CHOLHDL, LDLDIRECT in the last 72 hours. Thyroid Function Tests: Recent Labs    03/19/20 0500  TSH 1.391   Anemia Panel: No results for input(s): VITAMINB12, FOLATE, FERRITIN, TIBC, IRON, RETICCTPCT in the last 72 hours. Sepsis Labs: Recent Labs  Lab 03/18/20 2001  LATICACIDVEN 1.5    Recent Results (from the past 240 hour(s))  Culture, blood (Routine x 2)     Status: None (Preliminary result)   Collection Time: 03/18/20  7:45 PM   Specimen: BLOOD  Result Value Ref Range Status   Specimen Description   Final    BLOOD RIGHT ANTECUBITAL Performed at [facility] [address], [place], [place] [address]    Special Requests   Final    BOTTLES DRAWN AEROBIC AND ANAEROBIC Blood Culture results may not be optimal due to an excessive volume of blood received in culture bottles Performed at [facility] [address], [place], [place] [address]    Culture   Final    NO GROWTH < 12 HOURS Performed at [facility] [address], [place], [place] [address]    Report Status PENDING  Incomplete  Culture, blood (Routine x 2)     Status: None (Preliminary result)   Collection Time: 03/18/20  8:00 PM   Specimen: BLOOD RIGHT FOREARM  Result Value Ref Range Status   Specimen Description   Final    BLOOD RIGHT FOREARM Performed at [facility], [place] [address]  [place], [place] [address]    Special Requests   Final    BOTTLES DRAWN AEROBIC AND ANAEROBIC Blood Culture adequate volume Performed at [facility] [address] [place], [place] [address]    Culture   Final    NO GROWTH < 12 HOURS Performed at [facility] [address] [place], [place] [address]    Report Status PENDING  Incomplete  Respiratory Panel by RT PCR (Flu A&B, Covid) - Nasopharyngeal Swab     Status: None   Collection Time: 03/18/20  8:22 PM   Specimen: Nasopharyngeal Swab  Result Value Ref Range Status   SARS Coronavirus 2 by RT PCR NEGATIVE NEGATIVE Final    Comment: (NOTE) SARS-CoV-2 target nucleic acids are NOT DETECTED.  The SARS-CoV-2 RNA is generally detectable in upper respiratoy specimens during the acute phase of infection. The lowest concentration of SARS-CoV-2 viral copies this assay can detect is 131 copies/mL. A negative result does not preclude SARS-Cov-2 infection and should not be used as the sole basis for treatment or other patient management decisions. A negative result may occur with  improper specimen collection/handling, submission of specimen other than nasopharyngeal swab, presence of viral mutation(s) within the areas targeted by this assay, and inadequate number of viral copies (<131 copies/mL). A negative result must be combined with clinical observations, patient history, and epidemiological information. The expected result is Negative.  Fact Sheet for Patients:  PinkCheek.be  Fact Sheet for Healthcare Providers:  GravelBags.it  This test is no t yet approved or cleared by the [place] FDA and  has been authorized for detection and/or diagnosis of SARS-CoV-2 by FDA under an Emergency Use Authorization (EUA). This EUA will remain  in effect (meaning this test can be used) for the duration of the COVID-19 declaration under Section 564(b)(1) of the  Act, 21 U.S.C. section 360bbb-3(b)(1), unless the authorization is terminated  or revoked sooner.     Influenza A by PCR NEGATIVE NEGATIVE Final   Influenza B by PCR NEGATIVE NEGATIVE Final    Comment: (NOTE) The Xpert Xpress SARS-CoV-2/FLU/RSV assay is intended as an aid in  the diagnosis of influenza from Nasopharyngeal swab specimens and  should not be used as a sole basis for treatment. Nasal washings and  aspirates are unacceptable for Xpert Xpress SARS-CoV-2/FLU/RSV  testing.  Fact Sheet for Patients: PinkCheek.be  Fact Sheet for Healthcare Providers: GravelBags.it  This test is not yet approved or cleared by the [place] FDA and  has been authorized for detection and/or diagnosis of SARS-CoV-2 by  FDA under an Emergency Use Authorization (EUA). This EUA will remain  in effect (meaning this test can be used) for the duration of the  Covid-19 declaration under Section 564(b)(1) of the Act, 21  U.S.C. section 360bbb-3(b)(1), unless the authorization is  terminated or revoked. Performed at [facility], [place] [address], [place], [place] [address]      RN Pressure Injury Documentation:     Estimated body mass index is 28.59 kg/m as calculated from the following:   Height as of this encounter: 5\' 9"  (1.753 m).   Weight as of this encounter: 87.8 kg.  Malnutrition Type:      Malnutrition Characteristics:      Nutrition Interventions:    Radiology Studies: DG Chest 2 View  Result Date: 03/18/2020 CLINICAL DATA:  Lower extremity weakness. EXAM: CHEST - 2 VIEW COMPARISON:  December 27, 2017 FINDINGS: Multiple sternal wires and vascular clips are present. A trace amount of atelectasis is seen along the lateral aspect of the right lung base. Very mild blunting of the left costophrenic angle is seen. No pneumothorax is identified. The cardiac silhouette is mildly enlarged. An artificial  cardiac valve is seen. The visualized skeletal structures are unremarkable. IMPRESSION: 1. Evidence of prior median sternotomy/CABG. 2. Very mild right basilar atelectasis with a very small left pleural effusion. Electronically Signed   By: Virgina Norfolk M.D.   On: 03/18/2020 19:55   CT HEAD WO CONTRAST  Result Date: 03/19/2020 CLINICAL DATA:  79 year old male with neurologic deficit, weakness in both legs. EXAM: CT HEAD WITHOUT CONTRAST TECHNIQUE: Contiguous axial images were obtained from the base of the skull through the vertex without intravenous contrast. COMPARISON:  Brain MRI 03/12/2017.  Head CT 01/02/2020. FINDINGS: Brain: Stable cerebral volume. No midline shift, ventriculomegaly, mass effect, evidence of mass lesion, intracranial hemorrhage or evidence of cortically based acute infarction. Patchy and confluent bilateral white matter hypodensity is stable. No cortical encephalomalacia. Vascular: Calcified atherosclerosis at the skull base. No suspicious intracranial vascular hyperdensity. Skull: No acute osseous abnormality identified. Sinuses/Orbits: Visualized paranasal sinuses and mastoids are stable and well pneumatized. Other: Visualized orbits and scalp soft tissues are within normal limits. IMPRESSION: No acute intracranial abnormality. Stable chronic white matter changes most commonly due to small vessel disease. Electronically Signed   By: Genevie Ann M.D.   On: 03/19/2020 02:29   CT LUMBAR SPINE WO CONTRAST  Result Date: 03/19/2020 CLINICAL DATA:  79 year old male with low back pain, weakness in both legs. EXAM: CT LUMBAR SPINE WITHOUT CONTRAST TECHNIQUE: Multidetector CT imaging of the lumbar spine was performed without intravenous contrast administration. Multiplanar CT image reconstructions were also generated. COMPARISON:  CT Abdomen and Pelvis 12/27/2017. Lumbar MRI 09/19/2015. FINDINGS: Segmentation: Judging from hypoplastic ribs designated the T12 level, the L5 level is mostly  sacralized. This differs from the numbering  system used on the 2017 MRI (where S1 was designated as the transitional segment). Correlation with radiographs is recommended prior to any operative intervention. Alignment: Stable lumbar lordosis since 2017. No spondylolisthesis. Subtle levoconvex lumbar scoliosis. Vertebrae: Chronic L2 inferior endplate Schmorl's node is stable since 2017. No acute osseous abnormality identified. There is chronic SI joint ankylosis. Paraspinal and other soft tissues: Negative visible lung bases. Aortoiliac calcified atherosclerosis. Negative visible other abdominal viscera. Negative lumbar paraspinal soft tissues. Disc levels: Lower spinal degeneration does not appear significantly progressed since 2017, and is notable for: T10-T11: Vacuum disc and small vacuum containing Schmorl's node. L2-L3: Bulky far lateral disc osteophyte complex. Chronic Schmorl's node. Moderate facet hypertrophy. Mild to moderate bilateral L2 foraminal stenosis. L3-L4: Circumferential disc bulge with bulky endplate and facet spurring. Chronic epidural lipomatosis. Moderate spinal stenosis and mild to moderate left foraminal stenosis. L4-L5: Severe chronic facet arthropathy on the right. Chronic vacuum facet on that side. Superimposed circumferential disc bulge and endplate spurring eccentric to the right. Chronic epidural lipomatosis. Moderate spinal stenosis. Mild if any foraminal stenosis. L5-S1: Mostly sacralized, negative. IMPRESSION: 1. No acute osseous abnormality in the lumbar spine. 2. Transitional anatomy with a mostly sacralized L5 level suspected. This differs from the numbering system used on a 2017 MRI. 3. Advanced lower spinal degeneration appears stable since 2017, with multifactorial moderate spinal stenosis at L3-L4 and L4-L5. 4. Chronic SI joint ankylosis. 5. Aortic Atherosclerosis (ICD10-I70.0). Electronically Signed   By: Genevie Ann M.D.   On: 03/19/2020 02:25   MR CERVICAL SPINE WO  CONTRAST  Result Date: 03/19/2020 CLINICAL DATA:  Weakness and lower extremities. Cervical radiculopathy. Febrile illness. EXAM: MRI CERVICAL SPINE WITHOUT CONTRAST TECHNIQUE: Multiplanar, multisequence MR imaging of the cervical spine was performed. No intravenous contrast was administered. COMPARISON:  CT cervical spine 01/10/2019 FINDINGS: Examination is quite limited due to patient motion. Alignment: Overall normal alignment. Vertebrae: Advanced degenerative cervical spondylosis with multilevel disc disease and facet disease. No acute bony findings or worrisome bone lesion. No destructive bony changes. I do not see any findings suspicious for discitis or osteomyelitis. Cord: Cord narrowing/thinning at C4-5 without definite cord signal abnormality or cord lesion. However, exam is limited by patient motion. Posterior Fossa, vertebral arteries, paraspinal tissues: No significant findings. Disc levels: C2-3: No significant findings. C3-4: Bulging degenerated annulus, osteophytic ridging and uncinate spurring with flattening of the ventral thecal sac and mild right and moderate left foraminal stenosis. C4-5: Bulging degenerated annulus, moderate-sized central disc protrusion and osteophytic ridging contributing to significant spinal and moderate right foraminal stenosis. Mild left foraminal stenosis also. C5-6: Advanced degenerative disc disease and facet disease. Bulging degenerated annulus, osteophytic ridging and uncinate spurring with flattening of the ventral thecal sac and narrowing the ventral CSF space. Mild foraminal stenosis bilaterally. C6-7: Bulging degenerated annulus, osteophytic ridging and uncinate spurring. Flattening of the ventral thecal sac and narrowing the ventral CSF space. No foraminal stenosis. C7-T1: Advanced facet disease but no disc protrusions, spinal or foraminal stenosis. IMPRESSION: 1. Advanced degenerative cervical spondylosis with multilevel disc disease and facet disease. 2. No  findings for discitis or osteomyelitis. 3. Significant spinal and moderate right foraminal stenosis at C4-5. 4. Mild spinal and bilateral foraminal stenosis at C5-6. 5. Mild right and moderate left foraminal stenosis at C3-4. Electronically Signed   By: Marijo Sanes M.D.   On: 03/19/2020 12:16   Scheduled Meds:  atorvastatin  40 mg Oral q1800   donepezil  5 mg Oral QHS   insulin aspart  0-9 Units Subcutaneous TID WC   metoprolol succinate  75 mg Oral Daily   mometasone-formoterol  2 puff Inhalation BID   Warfarin - Pharmacist Dosing Inpatient   Does not apply q1600   Continuous Infusions:  cefTRIAXone (ROCEPHIN)  IV Stopped (03/20/20 0131)    LOS: 0 days   Kerney Elbe, DO [facility] PAGER is on [place]  If 7PM-7AM, please contact night-coverage [email or web address]

## 2020-03-20 NOTE — Progress Notes (Signed)
ANTICOAGULATION CONSULT NOTE - Initial Consult  Pharmacy Consult for warfarin Indication: atrial fibrillation  Allergies  Allergen Reactions  . Pineapple Concentrate Nausea And Vomiting    Patient Measurements: Height: 5\' 9"  (175.3 cm) Weight: 87.8 kg (193 lb 9.6 oz) IBW/kg (Calculated) : 70.7   Vital Signs: Temp: 97.9 F (36.6 C) (10/20 0529) Temp Source: Oral (10/20 0529) BP: 117/83 (10/20 0813) Pulse Rate: 86 (10/20 0529)  Labs: Recent Labs    03/18/20 2001 03/18/20 2001 03/19/20 0500 03/20/20 0344  HGB 12.8*   < > 10.7* 11.1*  HCT 40.9  --  34.0* 34.7*  PLT 159  --  143* 149*  LABPROT 24.1*  --  25.7* 23.1*  INR 2.3*  --  2.4* 2.1*  CREATININE 0.96  --  0.87 0.81   < > = values in this interval not displayed.    Estimated Creatinine Clearance: 85 mL/min (by C-G formula based on SCr of 0.81 mg/dL).   Medical History: Past Medical History:  Diagnosis Date  . Arthritis   . Atrial fibrillation ([place])   . CAD (coronary artery disease) 2012   [place]-LAD 2 or 3 srents  . Complication of anesthesia    "loopy after polpy removal dec 2017, lasted several days"  . COPD (chronic obstructive pulmonary disease) ([place])   . Dementia ([place])   . GERD (gastroesophageal reflux disease)   . HEART FAILURE, CONGESTIVE UNSPEC 02/23/2007   Qualifier: Diagnosis of  By: Mellody Drown MD, [facility]    . Hyperlipidemia   . Hypertension   . OSA (obstructive sleep apnea) 11/13/2014  . S/P AVR    #25 mm Edwards pericardial valve Bioprosthetic. Dr Cyndia Bent.  . Situational depression    "son passed 11/2013"  . Sleep apnea    occ uses c pap does not know settings   . Stented coronary artery 2013  . TOBACCO DEPENDENCE 07/29/2006   Qualifier: History of  By: Carlena Sax  MD, [facility]    . Type II diabetes mellitus [facility])    Assessment: 79 yo male presenting to the ED with weakness. PMH significant for atrial fibrillation (anticoagulated with warfarin PTA). Pt also has history of bioprosthetic aortic  valve replacement.   Home dose: warfarin 5 mg PO daily Last dose PTA: 10/18 @ 1800  Today, 03/20/20  CBC: Hgb/Plt both slightly low but stable  INR = 2.1 remains therapeutic   Diet: Heart healthy/carb modified.   DDI: None major. Pt on antibiotics which have the potential to enhance effects of warfarin  Goal of Therapy:  INR 2-3   Plan:   Continue home dose with warfarin 5 mg PO today  INR with AM labs tomorrow  Lenis Noon, PharmD 03/20/20 11:56 AM

## 2020-03-21 DIAGNOSIS — R509 Fever, unspecified: Secondary | ICD-10-CM | POA: Diagnosis not present

## 2020-03-21 DIAGNOSIS — I4811 Longstanding persistent atrial fibrillation: Secondary | ICD-10-CM | POA: Diagnosis not present

## 2020-03-21 DIAGNOSIS — I10 Essential (primary) hypertension: Secondary | ICD-10-CM | POA: Diagnosis not present

## 2020-03-21 DIAGNOSIS — R531 Weakness: Secondary | ICD-10-CM | POA: Diagnosis not present

## 2020-03-21 LAB — CBC WITH DIFFERENTIAL/PLATELET
Abs Immature Granulocytes: 0.01 10*3/uL (ref 0.00–0.07)
Basophils Absolute: 0 10*3/uL (ref 0.0–0.1)
Basophils Relative: 0 %
Eosinophils Absolute: 0.1 10*3/uL (ref 0.0–0.5)
Eosinophils Relative: 1 %
HCT: 35 % — ABNORMAL LOW (ref 39.0–52.0)
Hemoglobin: 10.8 g/dL — ABNORMAL LOW (ref 13.0–17.0)
Immature Granulocytes: 0 %
Lymphocytes Relative: 16 %
Lymphs Abs: 1.2 10*3/uL (ref 0.7–4.0)
MCH: 27.7 pg (ref 26.0–34.0)
MCHC: 30.9 g/dL (ref 30.0–36.0)
MCV: 89.7 fL (ref 80.0–100.0)
Monocytes Absolute: 0.5 10*3/uL (ref 0.1–1.0)
Monocytes Relative: 7 %
Neutro Abs: 5.7 10*3/uL (ref 1.7–7.7)
Neutrophils Relative %: 76 %
Platelets: 157 10*3/uL (ref 150–400)
RBC: 3.9 MIL/uL — ABNORMAL LOW (ref 4.22–5.81)
RDW: 15.5 % (ref 11.5–15.5)
WBC: 7.4 10*3/uL (ref 4.0–10.5)
nRBC: 0 % (ref 0.0–0.2)

## 2020-03-21 LAB — COMPREHENSIVE METABOLIC PANEL
ALT: 19 U/L (ref 0–44)
AST: 29 U/L (ref 15–41)
Albumin: 3.1 g/dL — ABNORMAL LOW (ref 3.5–5.0)
Alkaline Phosphatase: 60 U/L (ref 38–126)
Anion gap: 11 (ref 5–15)
BUN: 15 mg/dL (ref 8–23)
CO2: 28 mmol/L (ref 22–32)
Calcium: 10.1 mg/dL (ref 8.9–10.3)
Chloride: 96 mmol/L — ABNORMAL LOW (ref 98–111)
Creatinine, Ser: 0.8 mg/dL (ref 0.61–1.24)
GFR, Estimated: >60 mL/min (ref >60)
Glucose, Bld: 169 mg/dL — ABNORMAL HIGH (ref 70–99)
Potassium: 3.7 mmol/L (ref 3.5–5.1)
Sodium: 135 mmol/L (ref 135–145)
Total Bilirubin: 1.3 mg/dL — ABNORMAL HIGH (ref 0.3–1.2)
Total Protein: 6.8 g/dL (ref 6.5–8.1)

## 2020-03-21 LAB — GLUCOSE, CAPILLARY
Glucose-Capillary: 246 mg/dL — ABNORMAL HIGH (ref 70–99)
Glucose-Capillary: 187 mg/dL — ABNORMAL HIGH (ref 70–99)
Glucose-Capillary: 128 mg/dL — ABNORMAL HIGH (ref 70–99)
Glucose-Capillary: 124 mg/dL — ABNORMAL HIGH (ref 70–99)

## 2020-03-21 LAB — PROTIME-INR
INR: 2.2 — ABNORMAL HIGH (ref 0.8–1.2)
Prothrombin Time: 23.7 seconds — ABNORMAL HIGH (ref 11.4–15.2)

## 2020-03-21 LAB — MAGNESIUM: Magnesium: 2.1 mg/dL (ref 1.7–2.4)

## 2020-03-21 LAB — PHOSPHORUS: Phosphorus: 2.9 mg/dL (ref 2.5–4.6)

## 2020-03-21 MED ORDER — WARFARIN SODIUM 5 MG PO TABS
5.0000 mg | ORAL_TABLET | Freq: Once | ORAL | Status: AC
  Administered 2020-03-21: 5 mg via ORAL
  Filled 2020-03-21: qty 1

## 2020-03-21 NOTE — Plan of Care (Signed)
  Problem: Education: Goal: Knowledge of General Education information will improve Description Including pain rating scale, medication(s)/side effects and non-pharmacologic comfort measures Outcome: Progressing   

## 2020-03-21 NOTE — Consult Note (Signed)
Reason for Consult:lumbar spinal stenosis Referring Physician: [name] is an 79 y.o. male.  HPI: (Per Dr. Alfredia Ferguson): Matthew Edwards a 78 y.o.malewithhistory of CAD status post CABG and stenting, A. fib, chronic incontinence of bowels after surgery in 2017 for polyp, aortic valve replacement with pericardial tissue, hypertension, dementia was brought to the ER after patient's home health aide found the patient was getting increasingly weak over the last couple of days. About a month ago patient did have a fall and since then he was having some pain around the neck area. Recently over the last 1 week patient was found to have some blood in the urine and patient's primary care physician had placed patient on antibiotics.  ED Course:In the ER patient had a fever of 101 F Covid test was negative. UA still pending patient is refusing to get in and out cath. On exam patient has good strength of the both lower extremity and good sensation. CT head and CT lumbar spine was unremarkable. Patient states he is feeling it difficult to ambulate because of the weakness. Patient was empirically started on antibiotic despite patient not having any urine given since patient was having hematuria. Patient admitted for weakness. MRI C-spine is pending. Labs are at baseline. INR is therapeutic at 2.3. Blood cultures were obtained along with urine studies ordered.  The patient has baseline dementia.  He has been complaining of weakness and back pain.  Lumbar MRI shows spondylosis and moderate L 45 stenosis.  This does not appear severe enough to be causing the patient's current complaints.  Past Medical History:  Diagnosis Date  . Arthritis   . Atrial fibrillation ([place])   . CAD (coronary artery disease) 2012   [place]-LAD 2 or 3 srents  . Complication of anesthesia    "loopy after polpy removal dec 2017, lasted several days"  . COPD (chronic obstructive pulmonary disease) ([place])   .  Dementia ([place])   . GERD (gastroesophageal reflux disease)   . HEART FAILURE, CONGESTIVE UNSPEC 02/23/2007   Qualifier: Diagnosis of  By: Mellody Drown MD, [facility]    . Hyperlipidemia   . Hypertension   . OSA (obstructive sleep apnea) 11/13/2014  . S/P AVR    #25 mm Edwards pericardial valve Bioprosthetic. Dr Cyndia Bent.  . Situational depression    "son passed 11/2013"  . Sleep apnea    occ uses c pap does not know settings   . Stented coronary artery 2013  . TOBACCO DEPENDENCE 07/29/2006   Qualifier: History of  By: Carlena Sax  MD, [facility]    . Type II diabetes mellitus ([place])     Past Surgical History:  Procedure Laterality Date  . AORTIC VALVE REPLACEMENT  07/2010   notes 07/28/2010  . CARDIAC CATHETERIZATION  06/2010   Archie Endo 07/01/2010  . COLONOSCOPY WITH PROPOFOL N/A 02/08/2017   Procedure: COLONOSCOPY WITH PROPOFOL;  Surgeon: Doran Stabler, MD;  Location: [facility] END[facility]COPY;  Service: Gastroenterology;  Laterality: N/A;  . CORONARY ANGIOPLASTY WITH STENT PLACEMENT  2006; 10/2006   "2"; 1/notes 10/01/2010  . CORONARY ARTERY BYPASS GRAFT  07/2010   "1"/notes 07/28/2010  . PARTIAL PROCTECTOMY BY TEM N/A 05/26/2016   Procedure: TEM PARTIAL PROCTECTOMY OF RECTAL MASS;  Surgeon: [name], MD;  Location: [facility] ORS;  Service: General;  Laterality: N/A;    Family History  Problem Relation Age of Onset  . Heart disease Mother   . Colon cancer Neg Hx   . Esophageal cancer Neg Hx   .  Stomach cancer Neg Hx   . Pancreatic cancer Neg Hx   . Liver disease Neg Hx   . Colon polyps Neg Hx     Social History:  reports that he has quit smoking. His smoking use included cigarettes. He has a 23.00 pack-year smoking history. He has never used smokeless tobacco. He reports that he does not drink alcohol and does not use drugs.  Allergies:  Allergies  Allergen Reactions  . Pineapple Concentrate Nausea And Vomiting    Medications:  Results for orders placed or performed during the hospital encounter of  03/18/20 (from the past 48 hour(s))  Glucose, capillary     Status: Abnormal   Collection Time: 03/19/20  6:10 PM  Result Value Ref Range   Glucose-Capillary 116 (H) 70 - 99 mg/dL    Comment: Glucose reference range applies only to samples taken after fasting for at least 8 hours.  Glucose, capillary     Status: Abnormal   Collection Time: 03/19/20  9:08 PM  Result Value Ref Range   Glucose-Capillary 136 (H) 70 - 99 mg/dL    Comment: Glucose reference range applies only to samples taken after fasting for at least 8 hours.  Protime-INR     Status: Abnormal   Collection Time: 03/20/20  3:44 AM  Result Value Ref Range   Prothrombin Time 23.1 (H) 11.4 - 15.2 seconds   INR 2.1 (H) 0.8 - 1.2    Comment: (NOTE) INR goal varies based on device and disease states. Performed at [facility], [place] [address], [place], [place] [address]   CBC     Status: Abnormal   Collection Time: 03/20/20  3:44 AM  Result Value Ref Range   WBC 8.5 4.0 - 10.5 K/uL   RBC 3.95 (L) 4.22 - 5.81 MIL/uL   Hemoglobin 11.1 (L) 13.0 - 17.0 g/dL   HCT 34.7 (L) 39 - 52 %   MCV 87.8 80.0 - 100.0 fL   MCH 28.1 26.0 - 34.0 pg   MCHC 32.0 30.0 - 36.0 g/dL   RDW 15.1 11.5 - 15.5 %   Platelets 149 (L) 150 - 400 K/uL   nRBC 0.0 0.0 - 0.2 %    Comment: Performed at [facility], [place] [address] [place], [place] [address]  Comprehensive metabolic panel     Status: Abnormal   Collection Time: 03/20/20  3:44 AM  Result Value Ref Range   Sodium 136 135 - 145 mmol/L   Potassium 3.7 3.5 - 5.1 mmol/L   Chloride 98 98 - 111 mmol/L   CO2 27 22 - 32 mmol/L   Glucose, Bld 133 (H) 70 - 99 mg/dL    Comment: Glucose reference range applies only to samples taken after fasting for at least 8 hours.   BUN 17 8 - 23 mg/dL   Creatinine, Ser 0.81 0.61 - 1.24 mg/dL   Calcium 9.8 8.9 - 10.3 mg/dL   Total Protein 6.7 6.5 - 8.1 g/dL   Albumin 3.1 (L) 3.5 - 5.0 g/dL   AST 24 15 - 41 U/L   ALT 15 0 -  44 U/L   Alkaline Phosphatase 59 38 - 126 U/L   Total Bilirubin 1.4 (H) 0.3 - 1.2 mg/dL   GFR, Estimated >60 >60 mL/min   Anion gap 11 5 - 15    Comment: Performed at [facility], [place] [address], [place], [place] [address]  Glucose, capillary     Status: Abnormal  Collection Time: 03/20/20  7:31 AM  Result Value Ref Range   Glucose-Capillary 122 (H) 70 - 99 mg/dL    Comment: Glucose reference range applies only to samples taken after fasting for at least 8 hours.  Glucose, capillary     Status: Abnormal   Collection Time: 03/20/20 11:10 AM  Result Value Ref Range   Glucose-Capillary 221 (H) 70 - 99 mg/dL    Comment: Glucose reference range applies only to samples taken after fasting for at least 8 hours.  Glucose, capillary     Status: Abnormal   Collection Time: 03/20/20  4:25 PM  Result Value Ref Range   Glucose-Capillary 162 (H) 70 - 99 mg/dL    Comment: Glucose reference range applies only to samples taken after fasting for at least 8 hours.  Glucose, capillary     Status: Abnormal   Collection Time: 03/20/20  8:59 PM  Result Value Ref Range   Glucose-Capillary 148 (H) 70 - 99 mg/dL    Comment: Glucose reference range applies only to samples taken after fasting for at least 8 hours.  Glucose, capillary     Status: Abnormal   Collection Time: 03/20/20 10:41 PM  Result Value Ref Range   Glucose-Capillary 173 (H) 70 - 99 mg/dL    Comment: Glucose reference range applies only to samples taken after fasting for at least 8 hours.  Protime-INR     Status: Abnormal   Collection Time: 03/21/20  3:40 AM  Result Value Ref Range   Prothrombin Time 23.7 (H) 11.4 - 15.2 seconds   INR 2.2 (H) 0.8 - 1.2    Comment: (NOTE) INR goal varies based on device and disease states. Performed at [facility], [place] [address], [place], [place] [address]   CBC with Differential/Platelet     Status: Abnormal   Collection Time: 03/21/20  3:40 AM  Result Value  Ref Range   WBC 7.4 4.0 - 10.5 K/uL   RBC 3.90 (L) 4.22 - 5.81 MIL/uL   Hemoglobin 10.8 (L) 13.0 - 17.0 g/dL   HCT 35.0 (L) 39 - 52 %   MCV 89.7 80.0 - 100.0 fL   MCH 27.7 26.0 - 34.0 pg   MCHC 30.9 30.0 - 36.0 g/dL   RDW 15.5 11.5 - 15.5 %   Platelets 157 150 - 400 K/uL   nRBC 0.0 0.0 - 0.2 %   Neutrophils Relative % 76 %   Neutro Abs 5.7 1.7 - 7.7 K/uL   Lymphocytes Relative 16 %   Lymphs Abs 1.2 0.7 - 4.0 K/uL   Monocytes Relative 7 %   Monocytes Absolute 0.5 0.1 - 1.0 K/uL   Eosinophils Relative 1 %   Eosinophils Absolute 0.1 0.0 - 0.5 K/uL   Basophils Relative 0 %   Basophils Absolute 0.0 0.0 - 0.1 K/uL   Immature Granulocytes 0 %   Abs Immature Granulocytes 0.01 0.00 - 0.07 K/uL    Comment: Performed at [facility], [place] [address], [place], [place] [address]  Comprehensive metabolic panel     Status: Abnormal   Collection Time: 03/21/20  3:40 AM  Result Value Ref Range   Sodium 135 135 - 145 mmol/L   Potassium 3.7 3.5 - 5.1 mmol/L   Chloride 96 (L) 98 - 111 mmol/L   CO2 28 22 - 32 mmol/L   Glucose, Bld 169 (H) 70 - 99 mg/dL    Comment: Glucose reference range applies only to samples taken after fasting  for at least 8 hours.   BUN 15 8 - 23 mg/dL   Creatinine, Ser 0.80 0.61 - 1.24 mg/dL   Calcium 10.1 8.9 - 10.3 mg/dL   Total Protein 6.8 6.5 - 8.1 g/dL   Albumin 3.1 (L) 3.5 - 5.0 g/dL   AST 29 15 - 41 U/L   ALT 19 0 - 44 U/L   Alkaline Phosphatase 60 38 - 126 U/L   Total Bilirubin 1.3 (H) 0.3 - 1.2 mg/dL   GFR, Estimated >60 >60 mL/min   Anion gap 11 5 - 15    Comment: Performed at [facility], [place] [address], [place], [place] [address]  Magnesium     Status: None   Collection Time: 03/21/20  3:40 AM  Result Value Ref Range   Magnesium 2.1 1.7 - 2.4 mg/dL    Comment: Performed at [facility], [place] [address], [place], [place] [address]  Phosphorus     Status: None   Collection Time: 03/21/20  3:40 AM   Result Value Ref Range   Phosphorus 2.9 2.5 - 4.6 mg/dL    Comment: Performed at [facility], [place] [address], [place], [place] [address]  Glucose, capillary     Status: Abnormal   Collection Time: 03/21/20  8:49 AM  Result Value Ref Range   Glucose-Capillary 246 (H) 70 - 99 mg/dL    Comment: Glucose reference range applies only to samples taken after fasting for at least 8 hours.  Glucose, capillary     Status: Abnormal   Collection Time: 03/21/20 11:49 AM  Result Value Ref Range   Glucose-Capillary 187 (H) 70 - 99 mg/dL    Comment: Glucose reference range applies only to samples taken after fasting for at least 8 hours.  Glucose, capillary     Status: Abnormal   Collection Time: 03/21/20  4:40 PM  Result Value Ref Range   Glucose-Capillary 128 (H) 70 - 99 mg/dL    Comment: Glucose reference range applies only to samples taken after fasting for at least 8 hours.    DG Lumbar Spine Complete  Result Date: 03/20/2020 CLINICAL DATA:  Weakness. EXAM: LUMBAR SPINE - COMPLETE 4+ VIEW COMPARISON:  None. FINDINGS: No fracture or spondylolisthesis is noted. Large osteophyte formation is noted at L3-4 and L4-5 no significant disc space narrowing is noted. IMPRESSION: Degenerative changes as described above. No acute abnormality seen in the lumbar spine. Aortic Atherosclerosis (ICD10-I70.0). Electronically Signed   By: Marijo Conception M.D.   On: 03/20/2020 14:57   MR LUMBAR SPINE WO CONTRAST  Result Date: 03/20/2020 CLINICAL DATA:  Acute or progressive myelopathy EXAM: MRI LUMBAR SPINE WITHOUT CONTRAST TECHNIQUE: Multiplanar, multisequence MR imaging of the lumbar spine was performed. No intravenous contrast was administered. COMPARISON:  09/19/2015 FINDINGS: Segmentation: Transitional lumbosacral vertebra with rudimentary S1-2 disc space. This numbering scheme matches the comparison report describing the disc levels Alignment:  Physiologic Vertebrae: Chronic Schmorl's node in  the L3 inferior endplate. No acute fracture, discitis, or aggressive bone lesion Conus medullaris and cauda equina: Conus extends to the L1 level. Conus and cauda equina are unremarkable, although there is significant limitation by patient motion. Paraspinal and other soft tissues: Negative Disc levels: The spinal canal is congenitally narrow from short pedicles. L1-L2: Unremarkable. L2-L3: Mild facet spurring. Right far-lateral annulus bulging and endplate spurring which could contact the extraforaminal L2 nerve root L3-L4: Mild annulus bulging and facet spurring. L4-L5: Mild annulus bulging. Facet osteoarthritis with spurring  and joint distortion. Degenerative and congenital factors with dorsal epidural fat expansion cause moderate thecal sac compression. L5-S1:Unremarkable.Facet osteoarthritis with bulky asymmetric right-sided spurring. Mild disc narrowing and bulging. IMPRESSION: 1. Motion degraded study.  No visible conus abnormality. 2. Congenitally narrow spinal canal with superimposed degenerative disease causing thecal sac compression at L4-5. 3. No foraminal impingement. Electronically Signed   By: Monte Fantasia M.D.   On: 03/20/2020 09:03    Review of Systems -PerHPI   Blood pressure 117/72, pulse (!) 105, temperature 97.9 F (36.6 C), temperature source Oral, resp. rate 18, height 5\' 9"  (1.753 m), weight 87.8 kg, SpO2 95 %.  Assessment/Plan:  The patient has baseline dementia.  He has spinal arthritis and is complaining of low back pain.  He is also complaining of weakness in both of his legs.  His imaging findings are not terribly remarkable and I believe his complaints are multifactorial in nature.  I agree with pursuing PT and patient may require SNF placement.  He is not a candidate for surgical intervention and this would be unlikely to be of benefit to him.     Peggyann Shoals, MD 03/21/2020, 4:51 PM

## 2020-03-21 NOTE — Progress Notes (Signed)
PROGRESS NOTE    Matthew Edwards  PXT:[id number] DOB: Mar 06, 1941 DOA: 03/18/2020 PCP: Lind Covert, MD   Brief Narrative:  HPI per Gean Birchwood on 03/18/20  Matthew Edwards is a 79 y.o. male with history of CAD status post CABG and stenting, A. fib, chronic incontinence of bowels after surgery in 2017 for polyp, aortic valve replacement with pericardial tissue, hypertension, dementia was brought to the ER after patient's home health aide found the patient was getting increasingly weak over the last couple of days.  About a month ago patient did have a fall and since then he was having some pain around the neck area.  Recently over the last 1 week patient was found to have some blood in the urine and patient's primary care physician had placed patient on antibiotics.  ED Course: In the ER patient had a fever of 101 F Covid test was negative.  UA still pending patient is refusing to get in and out cath.  On exam patient has good strength of the both lower extremity and good sensation.  CT head and CT lumbar spine was unremarkable.  Patient states he is feeling it difficult to ambulate because of the weakness.  Patient was empirically started on antibiotic despite patient not having any urine given since patient was having hematuria.  Patient admitted for weakness.  MRI C-spine is pending.  Labs are at baseline.  INR is therapeutic at 2.3.  Blood cultures were obtained along with urine studies ordered.  **Interim History Neurosurgery recommended further imaging which has now been done.  PT OT recommending SNF. Still awaiting further neurosurgical evaluation recommendations and their review of the lumbar spine MRI and x-ray.  I finally was able to get in touch with neurosurgery Dr. Vertell Limber and he reviewed the patient's MRI and felt that the degree of pathology is not correlating with the patient's symptoms and felt that the patient is weakness was multifactorial and not related to his  spinal findings.  Dr. Vertell Limber did not recommend surgical intervention at this time from a neurosurgery perspective.  Patient states that his weakness was little bit better and he did work with therapy yesterday.  He remains significantly confused in the setting of his dementia and thought he was at home.  We will continue  Assessment & Plan:   Principal Problem:   Weakness Active Problems:   Essential hypertension   Type 2 diabetes mellitus with diabetic neuropathy, unspecified (HCC)   Atrial fibrillation (HCC)   Spinal stenosis of lumbar region   Fever   Lower extremity weakness   Generalized weakness  Bilateral Lower Extremity Weakness, likely multifactorial -Patient has no appreciable focal deficit on examination, CT head, lumbar spine unremarkable.   -MRI C-spine shows multilevel disc degenerative disease.   -My colleague Dr. Arcola Jansky and discussed with neurosurgery PA for Dr. Vertell Limber, he will see patient in consultation however they have just reviewed the films and will not actually see the patient.  -He recommends getting MRI lumbar spine as well as x-ray of lumbar spine 4 views.  -Patient likely has spinal stenosis contributing to patient's bilateral lower extremity weakness. -MRI done and showed " Motion degraded study.  No visible conus abnormality. Congenitally narrow spinal canal with superimposed degenerative disease causing thecal sac compression at L4-5. No foraminal impingement."  Dr. Vertell Limber personally reviewed the MRI and feels that the thecal sac compression is mild -Lumbar X-Ray showed "No fracture or spondylolisthesis is noted. Large osteophyte formation is noted at  L3-4 and L4-5 no significant disc space narrowing is noted." -PT OT evaluating and recommending SNF  -Further evaluation and recommendations by Neurosurgery was pending but Dr. Vertell Limber reached out to me today and feels that the degree of pathology the patient has on the MRI does not correlate with the patient's  findings to explain his weakness and Dr. Vertell Limber personally feels that patient had generalized deconditioning in the setting of his dementia and has multifactorial lower extremity weakness.  He recommends continuing rehabilitative efforts and does not recommend surgical intervention at this time.  Fever, improved -Patient initially had temperature of 101 F, unclear etiology but fever has resolved now.   -UA is unremarkable and clear -Chest x-ray is clear.   -Lactic acid level 1.5 -Initially was started on IV ceftriaxone for suspected UTI but is now been discontinued given that patient is afebrile has no leukocytosis and urinalysis is clear -WBC is within normal limits and is actually trended downward from 10.5 and is now 8.5 today.   -COVID-19 is negative.  -Blood cultures x2 show 2 days and respiratory virus panel was negative for influenza A and B and SARS-CoV-2 -We will continue to monitor closely and follow cultures.   -Continue with acetaminophen for fever   Diabetes Mellitus Type 2 complicated by diabetic neuropathy -Continue sliding scale insulin with Sensitive NovoLog SSI AC -CBG's ranging from 148-246 -Last hemoglobin A1c was 6.7 and this was done 2 and half months ago -Adjust insulin regimen as necessary  Paroxysmal Atrial Fibrillation  -Heart rate is controlled,  -Continue Metoprolol, Coumadin per Pharmacy dosing; INR therapeutic at 2.2 -Continue to Monitor on Telemetry   CAD status post CABG and stent placement History of aortic valve replacement Chronic diastolic CHF -Continue with atorvastatin 40 mg p.o. daily, Metoprolol Succinate 25 mg p.o. daily, as well as Coumadin per pharmacy dosing -Currently denies any chest pain; if patient has some chest pain will resume his nitroglycerin 1 tab every 5 minutes as needed for chest pain -Currently holding his home furosemide as needed as he is currently not volume overloaded -Continue to monitor for signs and symptoms of volume  overload and continue with strict I's and O's and daily weights  HLD -C/w Atorvastatin 40 mg po qHS  COPD -Currently not in Exacerbation -C/w Mometasone-Formoterol 100-5 mcg/act 2 puff IH BID  Dementia -Currently has no behavioral disturbances but remains significantly confused and was a little agitated -C/w Donepezil 5 mg po qHS -Likely has generalized weakness in the setting of his underlying dementia  Hyperbilirubinemia -Mild as T Bili is 1.4 yesterday and slightly trended down to 1.3 -Likely Reactive -Continue to Monitor and Trend; If remains elevated or worsening will obtain a RUQ U/S -Repeat CMP in the AM  Normocytic Anemia -Patient's Hgb/Hct went from 12.8/40.9 -> 10.7/34.0 -> 11.1/34.7 and today is 10.8/35.0 -Check Anemia Panel in the AM -Continue to Monitor for S/Sx of Bleeding; Currently no overt bleeding noted -Repeat CBC in the AM  Thrombocytopenia -Patient's Platelet Count went from 159 -> 143 -> 149 and is now 157 -Continue to Monitor for S/Sx of Bleeding -Currently no overt bleeding noted -Repeat CBC in the AM  Overweight -Estimated body mass index is 28.59 kg/m as calculated from the following:   Height as of this encounter: 5\' 9"  (1.753 m).   Weight as of this encounter: 87.8 kg. -Weight Loss and Dietary Counseling   DVT prophylaxis: Anticoagulated with Coumadin Code Status: FULL CODE Family Communication: None Disposition Plan: Pending further evaluation by  neurosurgery; neurosurgery feels that the patient's symptoms do not correlate with the pathology findings on MRI and feels that the weakness is multifactorial and not related to the spine and PT OT recommending skilled nursing facility so we will obtain [facility] consultation for assistance with placement  Status is: Inpatient  Remains inpatient appropriate because:Unsafe d/c plan, IV treatments appropriate due to intensity of illness or inability to take PO and Inpatient level of care appropriate due to  severity of illness   Dispo: The patient is from: Home              Anticipated d/c is to: SNF              Anticipated d/c date is: 2 days              Patient currently is not medically stable to d/c.   Consultants:   Neurosurgery Dr. Vertell Limber evaluated the patient's films and has not actually seen the patient in consultation and recommends no surgical intervention at this time   Procedures: MRI  Antimicrobials:  Anti-infectives (From admission, onward)   Start     Dose/Rate Route Frequency Ordered Stop   03/19/20 2200  cefTRIAXone (ROCEPHIN) 1 g in sodium chloride 0.9 % 100 mL IVPB  Status:  Discontinued        1 g 200 mL/hr over 30 Minutes Intravenous Every 24 hours 03/19/20 0626 03/20/20 1524   03/18/20 2300  cefTRIAXone (ROCEPHIN) 1 g in sodium chloride 0.9 % 100 mL IVPB        1 g 200 mL/hr over 30 Minutes Intravenous  Once 03/18/20 2250 03/18/20 2352        Subjective: Seen and examined at bedside and he was extremely confused and states that his back pain was improved and he was not as weak today.  Did not realize that he was in the hospital.  No nausea or vomiting.  Denies any chest pain, lightheadedness or dizziness.  Wanting to get up.  No other concerns or complaints this time.  Objective: Vitals:   03/20/20 1314 03/20/20 1807 03/20/20 2103 03/21/20 0603  BP: 133/77  110/70 110/72  Pulse: 85  (!) 42 (!) 109  Resp: 16  16 18   Temp: 98.7 F (37.1 C)  99.4 F (37.4 C) 98.8 F (37.1 C)  TempSrc: Oral  Oral Oral  SpO2: 96% 94% 95% 95%  Weight:      Height:        Intake/Output Summary (Last 24 hours) at 03/21/2020 0750 Last data filed at 03/20/2020 2131 Gross per 24 hour  Intake 120 ml  Output 350 ml  Net -230 ml   Filed Weights   03/18/20 1929  Weight: 87.8 kg   Examination: Physical Exam:  Constitutional: WN/WD overweight pleasantly confused and demented African-American male currently in no acute distress appears calm and comfortable Eyes: Lids  and conjunctivae normal, sclerae anicteric  ENMT: External Ears, Nose appear normal. Grossly normal hearing. Neck: Appears normal, supple, no cervical masses, normal ROM, no appreciable thyromegaly: No JVD Respiratory: Diminished to auscultation bilaterally, no wheezing, rales, rhonchi or crackles. Normal respiratory effort and patient is not tachypenic. No accessory muscle use.  Unlabored breathing Cardiovascular: RRR, no murmurs / rubs / gallops. S1 and S2 auscultated. No extremity edema. Abdomen: Soft, non-tender, distended secondary to body habitus. Bowel sounds positive.  GU: Deferred. Musculoskeletal: No clubbing / cyanosis of digits/nails. No joint deformity upper and lower extremities.  Skin: No rashes, lesions, ulcers on limited  skin evaluation. No induration; Warm and dry.  Neurologic: CN 2-12 grossly intact with no focal deficits. Romberg sign cerebellar reflexes not assessed.  Psychiatric: Impaired judgment and insight.  He is awake and alert and oriented x 1. Normal mood and appropriate affect.   Data Reviewed: I have personally reviewed following labs and imaging studies  CBC: Recent Labs  Lab 03/18/20 2001 03/19/20 0500 03/20/20 0344 03/21/20 0340  WBC 10.5 9.1 8.5 7.4  NEUTROABS 8.1*  --   --  5.7  HGB 12.8* 10.7* 11.1* 10.8*  HCT 40.9 34.0* 34.7* 35.0*  MCV 89.5 89.7 87.8 89.7  PLT 159 143* 149* 734   Basic Metabolic Panel: Recent Labs  Lab 03/18/20 2001 03/19/20 0500 03/20/20 0344 03/21/20 0340  NA 136 137 136 135  K 4.2 3.8 3.7 3.7  CL 97* 102 98 96*  CO2 28 28 27 28   GLUCOSE 152* 154* 133* 169*  BUN 21 19 17 15   CREATININE 0.96 0.87 0.81 0.80  CALCIUM 10.4* 9.7 9.8 10.1  MG  --   --   --  2.1  PHOS  --   --   --  2.9   GFR: Estimated Creatinine Clearance: 86.1 mL/min (by C-G formula based on SCr of 0.8 mg/dL). Liver Function Tests: Recent Labs  Lab 03/19/20 0500 03/20/20 0344 03/21/20 0340  AST 15 24 29   ALT 14 15 19   ALKPHOS 59 59 60    BILITOT 1.4* 1.4* 1.3*  PROT 6.6 6.7 6.8  ALBUMIN 3.2* 3.1* 3.1*   No results for input(s): LIPASE, AMYLASE in the last 168 hours. No results for input(s): AMMONIA in the last 168 hours. Coagulation Profile: Recent Labs  Lab 03/18/20 2001 03/19/20 0500 03/20/20 0344 03/21/20 0340  INR 2.3* 2.4* 2.1* 2.2*   Cardiac Enzymes: No results for input(s): CKTOTAL, CKMB, CKMBINDEX, TROPONINI in the last 168 hours. BNP (last 3 results) No results for input(s): PROBNP in the last 8760 hours. HbA1C: No results for input(s): HGBA1C in the last 72 hours. CBG: Recent Labs  Lab 03/20/20 0731 03/20/20 1110 03/20/20 1625 03/20/20 2059 03/20/20 2241  GLUCAP 122* 221* 162* 148* 173*   Lipid Profile: No results for input(s): CHOL, HDL, LDLCALC, TRIG, CHOLHDL, LDLDIRECT in the last 72 hours. Thyroid Function Tests: Recent Labs    03/19/20 0500  TSH 1.391   Anemia Panel: No results for input(s): VITAMINB12, FOLATE, FERRITIN, TIBC, IRON, RETICCTPCT in the last 72 hours. Sepsis Labs: Recent Labs  Lab 03/18/20 2001  LATICACIDVEN 1.5    Recent Results (from the past 240 hour(s))  Culture, blood (Routine x 2)     Status: None (Preliminary result)   Collection Time: 03/18/20  7:45 PM   Specimen: BLOOD  Result Value Ref Range Status   Specimen Description   Final    BLOOD RIGHT ANTECUBITAL Performed at [facility] [address] [place], [place] [address]    Special Requests   Final    BOTTLES DRAWN AEROBIC AND ANAEROBIC Blood Culture results may not be optimal due to an excessive volume of blood received in culture bottles Performed at [facility] [address], [place], [place] [address]    Culture   Final    NO GROWTH 2 DAYS Performed at [facility] [address], [place], [place] [address]    Report Status PENDING  Incomplete  Culture, blood (Routine x 2)     Status: None (Preliminary result)   Collection Time: 03/18/20  8:00 PM   Specimen: BLOOD RIGHT FOREARM  Result Value Ref Range Status   Specimen Description   Final    BLOOD RIGHT FOREARM Performed at [facility], [place] [address], [place], [place] [address]    Special Requests   Final    BOTTLES DRAWN AEROBIC AND ANAEROBIC Blood Culture adequate volume Performed at [facility] [address], [place], [place] [address]    Culture   Final    NO GROWTH 2 DAYS Performed at [facility] [address] [place], [place] [address]    Report Status PENDING  Incomplete  Respiratory Panel by RT PCR (Flu A&B, Covid) - Nasopharyngeal Swab     Status: None   Collection Time: 03/18/20  8:22 PM   Specimen: Nasopharyngeal Swab  Result Value Ref Range Status   SARS Coronavirus 2 by RT PCR NEGATIVE NEGATIVE Final    Comment: (NOTE) SARS-CoV-2 target nucleic acids are NOT DETECTED.  The SARS-CoV-2 RNA is generally detectable in upper respiratoy specimens during the acute phase of infection. The lowest concentration of SARS-CoV-2 viral copies this assay can detect is 131 copies/mL. A negative result does not preclude SARS-Cov-2 infection and should not be used as the sole basis for treatment or other patient management decisions. A negative result may occur with  improper specimen collection/handling, submission of specimen other than nasopharyngeal swab, presence of viral mutation(s) within the areas targeted by this assay, and inadequate number of viral copies (<131 copies/mL). A negative result must be combined with clinical observations, patient history, and epidemiological information. The expected result is Negative.  Fact Sheet for Patients:  PinkCheek.be  Fact Sheet for Healthcare Providers:  GravelBags.it  This test is no t yet approved or cleared by the [place] FDA and  has been authorized for detection and/or diagnosis of SARS-CoV-2 by FDA under  an Emergency Use Authorization (EUA). This EUA will remain  in effect (meaning this test can be used) for the duration of the COVID-19 declaration under Section 564(b)(1) of the Act, 21 U.S.C. section 360bbb-3(b)(1), unless the authorization is terminated or revoked sooner.     Influenza A by PCR NEGATIVE NEGATIVE Final   Influenza B by PCR NEGATIVE NEGATIVE Final    Comment: (NOTE) The Xpert Xpress SARS-CoV-2/FLU/RSV assay is intended as an aid in  the diagnosis of influenza from Nasopharyngeal swab specimens and  should not be used as a sole basis for treatment. Nasal washings and  aspirates are unacceptable for Xpert Xpress SARS-CoV-2/FLU/RSV  testing.  Fact Sheet for Patients: PinkCheek.be  Fact Sheet for Healthcare Providers: GravelBags.it  This test is not yet approved or cleared by the [place] FDA and  has been authorized for detection and/or diagnosis of SARS-CoV-2 by  FDA under an Emergency Use Authorization (EUA). This EUA will remain  in effect (meaning this test can be used) for the duration of the  Covid-19 declaration under Section 564(b)(1) of the Act, 21  U.S.C. section 360bbb-3(b)(1), unless the authorization is  terminated or revoked. Performed at [facility], [place] [address], [place], [place] [address]      RN Pressure Injury Documentation:     Estimated body mass index is 28.59 kg/m as calculated from the following:   Height as of this encounter: 5\' 9"  (1.753 m).   Weight as of this encounter: 87.8 kg.  Malnutrition Type:      Malnutrition Characteristics:      Nutrition Interventions:  Radiology Studies: DG Lumbar Spine Complete  Result Date: 03/20/2020 CLINICAL DATA:  Weakness. EXAM: LUMBAR SPINE - COMPLETE 4+ VIEW COMPARISON:  None. FINDINGS: No fracture or spondylolisthesis is noted. Large osteophyte formation is noted at L3-4 and L4-5 no significant  disc space narrowing is noted. IMPRESSION: Degenerative changes as described above. No acute abnormality seen in the lumbar spine. Aortic Atherosclerosis (ICD10-I70.0). Electronically Signed   By: Marijo Conception M.D.   On: 03/20/2020 14:57   MR CERVICAL SPINE WO CONTRAST  Result Date: 03/19/2020 CLINICAL DATA:  Weakness and lower extremities. Cervical radiculopathy. Febrile illness. EXAM: MRI CERVICAL SPINE WITHOUT CONTRAST TECHNIQUE: Multiplanar, multisequence MR imaging of the cervical spine was performed. No intravenous contrast was administered. COMPARISON:  CT cervical spine 01/10/2019 FINDINGS: Examination is quite limited due to patient motion. Alignment: Overall normal alignment. Vertebrae: Advanced degenerative cervical spondylosis with multilevel disc disease and facet disease. No acute bony findings or worrisome bone lesion. No destructive bony changes. I do not see any findings suspicious for discitis or osteomyelitis. Cord: Cord narrowing/thinning at C4-5 without definite cord signal abnormality or cord lesion. However, exam is limited by patient motion. Posterior Fossa, vertebral arteries, paraspinal tissues: No significant findings. Disc levels: C2-3: No significant findings. C3-4: Bulging degenerated annulus, osteophytic ridging and uncinate spurring with flattening of the ventral thecal sac and mild right and moderate left foraminal stenosis. C4-5: Bulging degenerated annulus, moderate-sized central disc protrusion and osteophytic ridging contributing to significant spinal and moderate right foraminal stenosis. Mild left foraminal stenosis also. C5-6: Advanced degenerative disc disease and facet disease. Bulging degenerated annulus, osteophytic ridging and uncinate spurring with flattening of the ventral thecal sac and narrowing the ventral CSF space. Mild foraminal stenosis bilaterally. C6-7: Bulging degenerated annulus, osteophytic ridging and uncinate spurring. Flattening of the ventral  thecal sac and narrowing the ventral CSF space. No foraminal stenosis. C7-T1: Advanced facet disease but no disc protrusions, spinal or foraminal stenosis. IMPRESSION: 1. Advanced degenerative cervical spondylosis with multilevel disc disease and facet disease. 2. No findings for discitis or osteomyelitis. 3. Significant spinal and moderate right foraminal stenosis at C4-5. 4. Mild spinal and bilateral foraminal stenosis at C5-6. 5. Mild right and moderate left foraminal stenosis at C3-4. Electronically Signed   By: Marijo Sanes M.D.   On: 03/19/2020 12:16   MR LUMBAR SPINE WO CONTRAST  Result Date: 03/20/2020 CLINICAL DATA:  Acute or progressive myelopathy EXAM: MRI LUMBAR SPINE WITHOUT CONTRAST TECHNIQUE: Multiplanar, multisequence MR imaging of the lumbar spine was performed. No intravenous contrast was administered. COMPARISON:  09/19/2015 FINDINGS: Segmentation: Transitional lumbosacral vertebra with rudimentary S1-2 disc space. This numbering scheme matches the comparison report describing the disc levels Alignment:  Physiologic Vertebrae: Chronic Schmorl's node in the L3 inferior endplate. No acute fracture, discitis, or aggressive bone lesion Conus medullaris and cauda equina: Conus extends to the L1 level. Conus and cauda equina are unremarkable, although there is significant limitation by patient motion. Paraspinal and other soft tissues: Negative Disc levels: The spinal canal is congenitally narrow from short pedicles. L1-L2: Unremarkable. L2-L3: Mild facet spurring. Right far-lateral annulus bulging and endplate spurring which could contact the extraforaminal L2 nerve root L3-L4: Mild annulus bulging and facet spurring. L4-L5: Mild annulus bulging. Facet osteoarthritis with spurring and joint distortion. Degenerative and congenital factors with dorsal epidural fat expansion cause moderate thecal sac compression. L5-S1:Unremarkable.Facet osteoarthritis with bulky asymmetric right-sided spurring.  Mild disc narrowing and bulging. IMPRESSION: 1. Motion degraded study.  No visible conus abnormality. 2. Congenitally narrow  spinal canal with superimposed degenerative disease causing thecal sac compression at L4-5. 3. No foraminal impingement. Electronically Signed   By: Monte Fantasia M.D.   On: 03/20/2020 09:03   Scheduled Meds: . atorvastatin  40 mg Oral q1800  . donepezil  5 mg Oral QHS  . insulin aspart  0-9 Units Subcutaneous TID WC  . metoprolol succinate  75 mg Oral Daily  . mometasone-formoterol  2 puff Inhalation BID  . Warfarin - Pharmacist Dosing Inpatient   Does not apply q1600   Continuous Infusions:   LOS: 1 day   Kerney Elbe, DO [facility] PAGER is on [place]  If 7PM-7AM, please contact night-coverage [email or web address]

## 2020-03-21 NOTE — Progress Notes (Signed)
ANTICOAGULATION CONSULT NOTE -  Consult  Pharmacy Consult for warfarin Indication: atrial fibrillation  Allergies  Allergen Reactions  . Pineapple Concentrate Nausea And Vomiting    Patient Measurements: Height: 5\' 9"  (175.3 cm) Weight: 87.8 kg (193 lb 9.6 oz) IBW/kg (Calculated) : 70.7   Vital Signs: Temp: 98.8 F (37.1 C) (10/21 0603) Temp Source: Oral (10/21 0603) BP: 110/72 (10/21 0603) Pulse Rate: 109 (10/21 0603)  Labs: Recent Labs    03/19/20 0500 03/19/20 0500 03/20/20 0344 03/21/20 0340  HGB 10.7*   < > 11.1* 10.8*  HCT 34.0*  --  34.7* 35.0*  PLT 143*  --  149* 157  LABPROT 25.7*  --  23.1* 23.7*  INR 2.4*  --  2.1* 2.2*  CREATININE 0.87  --  0.81 0.80   < > = values in this interval not displayed.    Estimated Creatinine Clearance: 86.1 mL/min (by C-G formula based on SCr of 0.8 mg/dL).   Medical History: Past Medical History:  Diagnosis Date  . Arthritis   . Atrial fibrillation ([place])   . CAD (coronary artery disease) 2012   [place]-LAD 2 or 3 srents  . Complication of anesthesia    "loopy after polpy removal dec 2017, lasted several days"  . COPD (chronic obstructive pulmonary disease) ([place])   . Dementia ([place])   . GERD (gastroesophageal reflux disease)   . HEART FAILURE, CONGESTIVE UNSPEC 02/23/2007   Qualifier: Diagnosis of  By: Mellody Drown MD, [facility]    . Hyperlipidemia   . Hypertension   . OSA (obstructive sleep apnea) 11/13/2014  . S/P AVR    #25 mm Edwards pericardial valve Bioprosthetic. Dr Cyndia Bent.  . Situational depression    "son passed 11/2013"  . Sleep apnea    occ uses c pap does not know settings   . Stented coronary artery 2013  . TOBACCO DEPENDENCE 07/29/2006   Qualifier: History of  By: Carlena Sax  MD, [facility]    . Type II diabetes mellitus [facility])    Assessment: 79 yo male presenting to the ED with weakness. PMH significant for atrial fibrillation (anticoagulated with warfarin PTA). Pt also has history of bioprosthetic aortic valve  replacement.   Home dose: warfarin 5 mg PO daily Last dose PTA: 10/18 @ 1800  Today, 03/21/20  CBC: Hgb/Plt both slightly low but stable  INR = 2.2 remains therapeutic   Diet: Heart healthy/carb modified.   DDI: None major. Pt on antibiotics which have the potential to enhance effects of warfarin  Goal of Therapy:  INR 2-3   Plan:   Continue home dose with warfarin 5 mg PO today  INR with AM labs tomorrow  Royetta Asal, PharmD, BCPS 03/21/2020 10:39 AM

## 2020-03-22 ENCOUNTER — Inpatient Hospital Stay (HOSPITAL_COMMUNITY): Payer: Medicare HMO

## 2020-03-22 DIAGNOSIS — R509 Fever, unspecified: Secondary | ICD-10-CM | POA: Diagnosis not present

## 2020-03-22 DIAGNOSIS — R531 Weakness: Secondary | ICD-10-CM | POA: Diagnosis not present

## 2020-03-22 DIAGNOSIS — I10 Essential (primary) hypertension: Secondary | ICD-10-CM | POA: Diagnosis not present

## 2020-03-22 DIAGNOSIS — I4811 Longstanding persistent atrial fibrillation: Secondary | ICD-10-CM | POA: Diagnosis not present

## 2020-03-22 LAB — CBC WITH DIFFERENTIAL/PLATELET
Abs Immature Granulocytes: 0.02 10*3/uL (ref 0.00–0.07)
Basophils Absolute: 0 10*3/uL (ref 0.0–0.1)
Basophils Relative: 0 %
Eosinophils Absolute: 0 10*3/uL (ref 0.0–0.5)
Eosinophils Relative: 1 %
HCT: 31.5 % — ABNORMAL LOW (ref 39.0–52.0)
Hemoglobin: 10 g/dL — ABNORMAL LOW (ref 13.0–17.0)
Immature Granulocytes: 0 %
Lymphocytes Relative: 17 %
Lymphs Abs: 1.1 10*3/uL (ref 0.7–4.0)
MCH: 27.8 pg (ref 26.0–34.0)
MCHC: 31.7 g/dL (ref 30.0–36.0)
MCV: 87.5 fL (ref 80.0–100.0)
Monocytes Absolute: 0.5 10*3/uL (ref 0.1–1.0)
Monocytes Relative: 7 %
Neutro Abs: 5 10*3/uL (ref 1.7–7.7)
Neutrophils Relative %: 75 %
Platelets: 173 10*3/uL (ref 150–400)
RBC: 3.6 MIL/uL — ABNORMAL LOW (ref 4.22–5.81)
RDW: 15.4 % (ref 11.5–15.5)
WBC: 6.7 10*3/uL (ref 4.0–10.5)
nRBC: 0 % (ref 0.0–0.2)

## 2020-03-22 LAB — COMPREHENSIVE METABOLIC PANEL
ALT: 30 U/L (ref 0–44)
AST: 47 U/L — ABNORMAL HIGH (ref 15–41)
Albumin: 2.8 g/dL — ABNORMAL LOW (ref 3.5–5.0)
Alkaline Phosphatase: 59 U/L (ref 38–126)
Anion gap: 11 (ref 5–15)
BUN: 14 mg/dL (ref 8–23)
CO2: 28 mmol/L (ref 22–32)
Calcium: 9.9 mg/dL (ref 8.9–10.3)
Chloride: 100 mmol/L (ref 98–111)
Creatinine, Ser: 0.8 mg/dL (ref 0.61–1.24)
GFR, Estimated: >60 mL/min (ref >60)
Glucose, Bld: 152 mg/dL — ABNORMAL HIGH (ref 70–99)
Potassium: 3.6 mmol/L (ref 3.5–5.1)
Sodium: 139 mmol/L (ref 135–145)
Total Bilirubin: 1.4 mg/dL — ABNORMAL HIGH (ref 0.3–1.2)
Total Protein: 6.4 g/dL — ABNORMAL LOW (ref 6.5–8.1)

## 2020-03-22 LAB — PROTIME-INR
INR: 2.4 — ABNORMAL HIGH (ref 0.8–1.2)
Prothrombin Time: 25.5 seconds — ABNORMAL HIGH (ref 11.4–15.2)

## 2020-03-22 LAB — MAGNESIUM: Magnesium: 2 mg/dL (ref 1.7–2.4)

## 2020-03-22 LAB — VITAMIN B12: Vitamin B-12: 220 pg/mL (ref 180–914)

## 2020-03-22 LAB — RETICULOCYTES
Immature Retic Fract: 15.7 % (ref 2.3–15.9)
RBC.: 3.59 MIL/uL — ABNORMAL LOW (ref 4.22–5.81)
Retic Count, Absolute: 28.4 10*3/uL (ref 19.0–186.0)
Retic Ct Pct: 0.8 % (ref 0.4–3.1)

## 2020-03-22 LAB — GLUCOSE, CAPILLARY
Glucose-Capillary: 137 mg/dL — ABNORMAL HIGH (ref 70–99)
Glucose-Capillary: 171 mg/dL — ABNORMAL HIGH (ref 70–99)
Glucose-Capillary: 118 mg/dL — ABNORMAL HIGH (ref 70–99)
Glucose-Capillary: 169 mg/dL — ABNORMAL HIGH (ref 70–99)

## 2020-03-22 LAB — IRON AND TIBC
Iron: 18 ug/dL — ABNORMAL LOW (ref 45–182)
Saturation Ratios: 10 % — ABNORMAL LOW (ref 17.9–39.5)
TIBC: 183 ug/dL — ABNORMAL LOW (ref 250–450)
UIBC: 165 ug/dL

## 2020-03-22 LAB — FOLATE: Folate: 11.3 ng/mL (ref >5.9)

## 2020-03-22 LAB — PHOSPHORUS: Phosphorus: 2.8 mg/dL (ref 2.5–4.6)

## 2020-03-22 LAB — FERRITIN: Ferritin: 466 ng/mL — ABNORMAL HIGH (ref 24–336)

## 2020-03-22 MED ORDER — GLUCERNA SHAKE PO LIQD
237.0000 mL | Freq: Two times a day (BID) | ORAL | Status: DC
  Administered 2020-03-23 – 2020-03-27 (×9): 237 mL via ORAL
  Filled 2020-03-22 (×10): qty 237

## 2020-03-22 MED ORDER — POLYSACCHARIDE IRON COMPLEX 150 MG PO CAPS
150.0000 mg | ORAL_CAPSULE | Freq: Every day | ORAL | Status: DC
  Administered 2020-03-22 – 2020-03-27 (×6): 150 mg via ORAL
  Filled 2020-03-22 (×6): qty 1

## 2020-03-22 MED ORDER — ADULT MULTIVITAMIN W/MINERALS CH
1.0000 | ORAL_TABLET | Freq: Every day | ORAL | Status: DC
  Administered 2020-03-22 – 2020-03-27 (×6): 1 via ORAL
  Filled 2020-03-22 (×5): qty 1

## 2020-03-22 MED ORDER — WARFARIN SODIUM 5 MG PO TABS
5.0000 mg | ORAL_TABLET | Freq: Every day | ORAL | Status: DC
  Administered 2020-03-22 – 2020-03-26 (×5): 5 mg via ORAL
  Filled 2020-03-22 (×5): qty 1

## 2020-03-22 NOTE — Progress Notes (Signed)
Physical Therapy Treatment Patient Details Name: Matthew Edwards MRN: [id number] DOB: 07-Mar-1941 Today's Date: 03/22/2020    History of Present Illness 79 y.o. male with history of CAD status post CABG and stenting, A. fib, chronic incontinence of bowels after surgery in 2017 for polyp, aortic valve replacement with pericardial tissue, hypertension, dementia was brought to the ER after patient's home health aide found the patient was getting increasingly weak over the last couple of days. Recent Dx of UTI. CT showed spinal stenosis L3-4 and L4-5, MRI of cervical spine showed degenerative disc disease.    PT Comments    Pt agreeable to mobilize however unable to stand due to weakness.  Pt appears better able to follow commands today.  Continue to recommend SNF at this time.  Follow Up Recommendations  SNF;Supervision/Assistance - 24 hour     Equipment Recommendations  None recommended by PT    Recommendations for Other Services       Precautions / Restrictions Precautions Precautions: Fall    Mobility  Bed Mobility Overal bed mobility: Needs Assistance Bed Mobility: Rolling;Supine to Sit;Sit to Supine Rolling: Max assist   Supine to sit: Max assist;HOB elevated;+2 for physical assistance Sit to supine: HOB elevated;+2 for physical assistance;Max assist   General bed mobility comments: assist for lower and upper body, pt attempted to assist with UEs however reports limited strength in R UE (chronic per pt); increased assist to roll as well (pt requested bed pan end of session, nurse tech aware pt left on bed pan)  Transfers Overall transfer level: Needs assistance               General transfer comment: attempted sit to stand with [place], pt following commands for appropriate positioning however not assisting with forward lean or rise, not able to lift buttocks off bed surface  Ambulation/Gait                 Stairs             Wheelchair Mobility     Modified Rankin (Stroke Patients Only)       Balance Overall balance assessment: Needs assistance Sitting-balance support: Bilateral upper extremity supported;Feet supported Sitting balance-Leahy Scale: Poor                                      Cognition Arousal/Alertness: Awake/alert Behavior During Therapy: WFL for tasks assessed/performed Overall Cognitive Status: No family/caregiver present to determine baseline cognitive functioning                                 General Comments: pt able to state name and that he was in hospital      Exercises      General Comments        Pertinent Vitals/Pain Pain Assessment: Faces Faces Pain Scale: No hurt    Home Living                      Prior Function            PT Goals (current goals can now be found in the care plan section) Progress towards PT goals: Progressing toward goals    Frequency    Min 2X/week      PT Plan Current plan remains appropriate    Co-evaluation  AM-PAC PT "6 Clicks" Mobility   Outcome Measure  Help needed turning from your back to your side while in a flat bed without using bedrails?: A Lot Help needed moving from lying on your back to sitting on the side of a flat bed without using bedrails?: Total Help needed moving to and from a bed to a chair (including a wheelchair)?: Total Help needed standing up from a chair using your arms (Edwards.g., wheelchair or bedside chair)?: Total Help needed to walk in hospital room?: Total Help needed climbing 3-5 steps with a railing? : Total 6 Click Score: 7    End of Session   Activity Tolerance: Patient tolerated treatment well Patient left: in bed;with call bell/phone within reach;with bed alarm set   PT Visit Diagnosis: Difficulty in walking, not elsewhere classified (R26.2);Muscle weakness (generalized) (M62.81)     Time: 1610-9604 PT Time Calculation (min) (ACUTE ONLY): 18  min  Charges:  $Therapeutic Activity: 8-22 mins                    Jannette Spanner PT, DPT Acute Rehabilitation Services Pager: [phone] Office: [phone]  Matthew Edwards 03/22/2020, 1:04 PM

## 2020-03-22 NOTE — Progress Notes (Signed)
Initial Nutrition Assessment  DOCUMENTATION CODES:   Not applicable  INTERVENTION:  Glucerna Shake po BID, each supplement provides 220 kcal and 10 grams of protein  MVI with minerals daily    NUTRITION DIAGNOSIS:   Inadequate oral intake related to lethargy/confusion, acute illness (dementia; weakness) as evidenced by percent weight loss.    GOAL:   Patient will meet greater than or equal to 90% of their needs    MONITOR:   PO intake, Weight trends, Supplement acceptance, Labs, I & O's, Skin  REASON FOR ASSESSMENT:   Consult Poor PO, Assessment of nutrition requirement/status  ASSESSMENT:   RD working remotely.  79 year old male with history of CAD s/p CABG and stenting, Afib, chronic incontinence of bowel s/p surgery for polyp in 2017, aortic valve replacement, HTN, dementia who presented after home health aide noticed pt with increasing weakness over the past couple of days and hematuria over the past week for which he was seen by PCP and placed on antibiotics.  Patient noted significantly confused in the setting of his dementia. RD working remotely and unable to obtain nutrition history at this time. No documented meals for review. Will continue to monitor. Suspect decreased po intake due to weakness as well as confusion and recent wt trends, will order Glucerna BID and daily MVI to aid with meeting needs.  Weights have been trending down over the past year. Per chart, weights have decreased 36 lbs (15.8%) over the past year; insignificant. In the last month, weights have decreased ~11 lbs (5.2%); significant. Given trends as well as history significant of dementia as well as chronic diastolic CHF, highly suspect degree of malnutrition, however unable to identify at this time. Will plan to complete exam at follow-up.  BLE non-pitting edema noted  Per notes: -likely spinal stenosis contributing to BLE weakness -MRI showed mild sacral compression, no visible conus  abnormality -PT recommending SNF, family agreeable -resolved fever, unclear etiology -continue to follow cultures Medications reviewed and include: Niferex, Warfarin,   Labs: CBGs [phone] K/Mg/P-WNL 01/04/20 A1c- 6.7  NUTRITION - FOCUSED PHYSICAL EXAM: Unable to complete at this time, RD working remotely.  Diet Order:   Diet Order            Diet heart healthy/carb modified Room service appropriate? No; Fluid consistency: Thin  Diet effective now                 EDUCATION NEEDS:   Not appropriate for education at this time  Skin:  Skin Assessment: Reviewed RN Assessment  Last BM:  10/21-type 5  Height:   Ht Readings from Last 1 Encounters:  03/18/20 5\' 9"  (1.753 m)    Weight:   Wt Readings from Last 1 Encounters:  03/18/20 87.8 kg    BMI:  Body mass index is 28.59 kg/m.  Estimated Nutritional Needs:   Kcal:  1900-2100  Protein:  95-105  Fluid:  >/= 1.9 L/day   Lajuan Lines, RD, LDN Clinical Nutrition After Hours/Weekend Pager # in [place]

## 2020-03-22 NOTE — Plan of Care (Signed)
  Problem: Education: Goal: Knowledge of General Education information will improve Description: Including pain rating scale, medication(s)/side effects and non-pharmacologic comfort measures Outcome: Progressing   Problem: Clinical Measurements: Goal: Ability to maintain clinical measurements within normal limits will improve Outcome: Progressing Goal: Will remain free from infection Outcome: Progressing Goal: Diagnostic test results will improve Outcome: Progressing   Problem: Activity: Goal: Risk for activity intolerance will decrease Outcome: Progressing   Problem: Nutrition: Goal: Adequate nutrition will be maintained Outcome: Progressing   Problem: Coping: Goal: Level of anxiety will decrease Outcome: Progressing   Problem: Elimination: Goal: Will not experience complications related to bowel motility Outcome: Progressing Goal: Will not experience complications related to urinary retention Outcome: Progressing   Problem: Pain Managment: Goal: General experience of comfort will improve Outcome: Progressing   Problem: Safety: Goal: Ability to remain free from injury will improve Outcome: Progressing   Problem: Skin Integrity: Goal: Risk for impaired skin integrity will decrease Outcome: Progressing   

## 2020-03-22 NOTE — Progress Notes (Signed)
PROGRESS NOTE    Matthew Edwards  FGH:[id number] DOB: 1941/01/05 DOA: 03/18/2020 PCP: Lind Covert, MD   Brief Narrative:  HPI per Gean Birchwood on 03/18/20  Matthew Edwards is a 79 y.o. male with history of CAD status post CABG and stenting, A. fib, chronic incontinence of bowels after surgery in 2017 for polyp, aortic valve replacement with pericardial tissue, hypertension, dementia was brought to the ER after patient's home health aide found the patient was getting increasingly weak over the last couple of days.  About a month ago patient did have a fall and since then he was having some pain around the neck area.  Recently over the last 1 week patient was found to have some blood in the urine and patient's primary care physician had placed patient on antibiotics.  ED Course: In the ER patient had a fever of 101 F Covid test was negative.  UA still pending patient is refusing to get in and out cath.  On exam patient has good strength of the both lower extremity and good sensation.  CT head and CT lumbar spine was unremarkable.  Patient states he is feeling it difficult to ambulate because of the weakness.  Patient was empirically started on antibiotic despite patient not having any urine given since patient was having hematuria.  Patient admitted for weakness.  MRI C-spine is pending.  Labs are at baseline.  INR is therapeutic at 2.3.  Blood cultures were obtained along with urine studies ordered.  **Interim History Neurosurgery recommended further imaging which has now been done.  PT OT recommending SNF. Still awaiting further neurosurgical evaluation recommendations and their review of the lumbar spine MRI and x-ray.  I finally was able to get in touch with neurosurgery Dr. Vertell Limber and he reviewed the patient's MRI and felt that the degree of pathology is not correlating with the patient's symptoms and felt that the patient is weakness was multifactorial and not related to his  spinal findings.  Dr. Vertell Limber did not recommend surgical intervention at this time from a neurosurgery perspective.  Patient states that his weakness was little bit better and he did work with therapy yesterday.  He remains significantly confused in the setting of his dementia and thought he was at home.  We will continue  Assessment & Plan:   Principal Problem:   Weakness Active Problems:   Essential hypertension   Type 2 diabetes mellitus with diabetic neuropathy, unspecified (HCC)   Atrial fibrillation (HCC)   Spinal stenosis of lumbar region   Fever   Lower extremity weakness   Generalized weakness  Bilateral Lower Extremity Weakness, likely multifactorial -Patient has no appreciable focal deficit on examination, CT head, lumbar spine unremarkable.   -MRI C-spine shows multilevel disc degenerative disease.   -My colleague Dr. Arcola Jansky and discussed with neurosurgery PA for Dr. Vertell Limber, he will see patient in consultation however they have just reviewed the films and will not actually see the patient.  -He recommends getting MRI lumbar spine as well as x-ray of lumbar spine 4 views.  -Patient likely has spinal stenosis contributing to patient's bilateral lower extremity weakness. -MRI done and showed " Motion degraded study.  No visible conus abnormality. Congenitally narrow spinal canal with superimposed degenerative disease causing thecal sac compression at L4-5. No foraminal impingement."  Dr. Vertell Limber personally reviewed the MRI and feels that the thecal sac compression is mild -Lumbar X-Ray showed "No fracture or spondylolisthesis is noted. Large osteophyte formation is noted at  L3-4 and L4-5 no significant disc space narrowing is noted." -PT OT evaluating and recommending SNF and patient and family are agreeable -Further evaluation and recommendations by Neurosurgery was pending but Dr. Vertell Limber reached out to me today and feels that the degree of pathology the patient has on the MRI does  not correlate with the patient's findings to explain his weakness and Dr. Vertell Limber personally feels that patient had generalized deconditioning in the setting of his dementia and has multifactorial lower extremity weakness.  He recommends continuing rehabilitative efforts and does not recommend surgical intervention at this time.  Fever, improved -Patient initially had temperature of 101 F, unclear etiology but fever has resolved now.   -UA is unremarkable and clear -Chest x-ray is clear.   -Lactic acid level 1.5 -Initially was started on IV ceftriaxone for suspected UTI but is now been discontinued given that patient is afebrile has no leukocytosis and urinalysis is clear -WBC is within normal limits and is actually trended downward from 10.5 and is now 8.5 today.   -COVID-19 is negative.  -Blood cultures x2 shows NGTD at 4 days and respiratory virus panel was negative for influenza A and B and SARS-CoV-2 -We will continue to monitor closely and follow cultures.   -Continue with acetaminophen for fever   Diabetes Mellitus Type 2 complicated by Diabetic Neuropathy -Continue sliding scale insulin with Sensitive NovoLog SSI AC -CBG's ranging from 124-171 -Last hemoglobin A1c was 6.7 and this was done 2 and half months ago -Adjust insulin regimen as necessary  Paroxysmal Atrial Fibrillation  -Heart rate is controlled,  -Continue Metoprolol, Coumadin per Pharmacy dosing; INR therapeutic at 2.4 -Continue to Monitor on Telemetry   CAD status post CABG and stent placement History of aortic valve replacement Chronic diastolic CHF -Continue with atorvastatin 40 mg p.o. daily, Metoprolol Succinate 25 mg p.o. daily, as well as Coumadin per pharmacy dosing -Currently denies any chest pain; if patient has some chest pain will resume his nitroglycerin 1 tab every 5 minutes as needed for chest pain -Currently holding his home furosemide as needed as he is currently not volume overloaded -Continue to  monitor for signs and symptoms of volume overload and continue with strict I's and O's and daily weights  HLD -C/w Atorvastatin 40 mg po qHS  COPD -Currently not in Exacerbation -C/w Mometasone-Formoterol 100-5 mcg/act 2 puff IH BID  Dementia -Currently has no behavioral disturbances but remains significantly confused and was a little agitated -C/w Donepezil 5 mg po qHS -Likely has generalized weakness in the setting of his underlying dementia  Hyperbilirubinemia -Mild as T Bili remains elevated though at 1.4 -Likely Reactive -Continue to Monitor and Trend; since it remains elevated will obtain a right upper quadrant ultrasound to further evaluate -Repeat CMP in the AM  Normocytic Anemia -Patient's Hgb/Hct went from 12.8/40.9 -> 10.7/34.0 -> 11.1/34.7 and today is 10.8/35.0 -Checked Anemia Panel and showed an iron level of 18, U IBC 165, TIBC 183, saturation ratios of 10%, ferritin level 466, folate level 11.3, vitamin B12 level 220 -Will start Niferex 150 mg po Daily  -Continue to Monitor for S/Sx of Bleeding; Currently no overt bleeding noted -Repeat CBC in the AM  Thrombocytopenia -Patient's Platelet Count went from 159 -> 143 -> 149 -> 157 -> 173 -Continue to Monitor for S/Sx of Bleeding -Currently no overt bleeding noted -Repeat CBC in the AM  Elevated AST -Mild but slightly worsening -Patient's AST went from 29 -> 47 -Check RUQ U/S and Acute Hepatitis Panel -  Continue to Monitor and Trend LFT's carefully  Overweight -Estimated body mass index is 28.59 kg/m as calculated from the following:   Height as of this encounter: 5\' 9"  (1.753 m).   Weight as of this encounter: 87.8 kg. -Weight Loss and Dietary Counseling  DVT prophylaxis: Anticoagulated with Coumadin Code Status: FULL CODE Family Communication: Cussed with the patient's daughter yesterday Disposition Plan: Pending further evaluation by neurosurgery; neurosurgery feels that the patient's symptoms do not  correlate with the pathology findings on MRI and feels that the weakness is multifactorial and not related to the spine and PT OT recommending skilled nursing facility so we will obtain [facility] consultation for assistance with placement  Status is: Inpatient  Remains inpatient appropriate because:Unsafe d/c plan, IV treatments appropriate due to intensity of illness or inability to take PO and Inpatient level of care appropriate due to severity of illness   Dispo: The patient is from: Home              Anticipated d/c is to: SNF              Anticipated d/c date is: 2 days              Patient currently is not medically stable to d/c.   Consultants:   Neurosurgery Dr. Vertell Limber evaluated the patient's films and has not actually seen the patient in consultation and recommends no surgical intervention at this time   Procedures: MRI  Antimicrobials:  Anti-infectives (From admission, onward)   Start     Dose/Rate Route Frequency Ordered Stop   03/19/20 2200  cefTRIAXone (ROCEPHIN) 1 g in sodium chloride 0.9 % 100 mL IVPB  Status:  Discontinued        1 g 200 mL/hr over 30 Minutes Intravenous Every 24 hours 03/19/20 0626 03/20/20 1524   03/18/20 2300  cefTRIAXone (ROCEPHIN) 1 g in sodium chloride 0.9 % 100 mL IVPB        1 g 200 mL/hr over 30 Minutes Intravenous  Once 03/18/20 2250 03/18/20 2352        Subjective: Seen and examined at bedside and he remains confused and states that his back pain is not terrible today.  He is complaining of some numbness in his arm but states his legs are doing better.  No chest pain, lightheadedness or dizziness.  No other concerns or complaints at this time.  Objective: Vitals:   03/21/20 2047 03/22/20 0547 03/22/20 0934 03/22/20 1302  BP: 119/69 124/77  100/73  Pulse: 84 80  77  Resp:  20  18  Temp: 98.9 F (37.2 C) 99.5 F (37.5 C)  98.7 F (37.1 C)  TempSrc: Oral Oral  Oral  SpO2: 94% 94% 96% 93%  Weight:      Height:        Intake/Output  Summary (Last 24 hours) at 03/22/2020 1532 Last data filed at 03/21/2020 2106 Gross per 24 hour  Intake --  Output 375 ml  Net -375 ml   Filed Weights   03/18/20 1929  Weight: 87.8 kg   Examination: Physical Exam:  Constitutional: WN/WD overweight pleasantly confused and demented African-American male currently no acute distress appears calm but slightly uncomfortable Eyes: Lids and conjunctivae normal, sclerae anicteric  ENMT: External Ears, Nose appear normal. Grossly normal hearing.  Neck: Appears normal, supple, no cervical masses, normal ROM, no appreciable thyromegaly; no JVD Respiratory: Diminished to auscultation bilaterally, no wheezing, rales, rhonchi or crackles. Normal respiratory effort and patient is not  tachypenic. No accessory muscle use.  Unlabored breathing Cardiovascular: RRR, no murmurs / rubs / gallops. S1 and S2 auscultated.  Minimal extremity edema Abdomen: Soft, non-tender, distended secondary to body habitus. Bowel sounds positive.  GU: Deferred. Musculoskeletal: No clubbing / cyanosis of digits/nails. No joint deformity upper and lower extremities.  Skin: No rashes, lesions, ulcers on limited skin evaluation. No induration; Warm and dry.  Neurologic: CN 2-12 grossly intact with no focal deficits. Romberg sign and cerebellar reflexes not assessed.  Psychiatric: Impaired judgment and insight. Alert and oriented x 1. Normal mood and appropriate affect.   Data Reviewed: I have personally reviewed following labs and imaging studies  CBC: Recent Labs  Lab 03/18/20 2001 03/19/20 0500 03/20/20 0344 03/21/20 0340 03/22/20 0329  WBC 10.5 9.1 8.5 7.4 6.7  NEUTROABS 8.1*  --   --  5.7 5.0  HGB 12.8* 10.7* 11.1* 10.8* 10.0*  HCT 40.9 34.0* 34.7* 35.0* 31.5*  MCV 89.5 89.7 87.8 89.7 87.5  PLT 159 143* 149* 157 191   Basic Metabolic Panel: Recent Labs  Lab 03/18/20 2001 03/19/20 0500 03/20/20 0344 03/21/20 0340 03/22/20 0329  NA 136 137 136 135 139  K  4.2 3.8 3.7 3.7 3.6  CL 97* 102 98 96* 100  CO2 28 28 27 28 28   GLUCOSE 152* 154* 133* 169* 152*  BUN 21 19 17 15 14   CREATININE 0.96 0.87 0.81 0.80 0.80  CALCIUM 10.4* 9.7 9.8 10.1 9.9  MG  --   --   --  2.1 2.0  PHOS  --   --   --  2.9 2.8   GFR: Estimated Creatinine Clearance: 86.1 mL/min (by C-G formula based on SCr of 0.8 mg/dL). Liver Function Tests: Recent Labs  Lab 03/19/20 0500 03/20/20 0344 03/21/20 0340 03/22/20 0329  AST 15 24 29  47*  ALT 14 15 19 30   ALKPHOS 59 59 60 59  BILITOT 1.4* 1.4* 1.3* 1.4*  PROT 6.6 6.7 6.8 6.4*  ALBUMIN 3.2* 3.1* 3.1* 2.8*   No results for input(s): LIPASE, AMYLASE in the last 168 hours. No results for input(s): AMMONIA in the last 168 hours. Coagulation Profile: Recent Labs  Lab 03/18/20 2001 03/19/20 0500 03/20/20 0344 03/21/20 0340 03/22/20 0329  INR 2.3* 2.4* 2.1* 2.2* 2.4*   Cardiac Enzymes: No results for input(s): CKTOTAL, CKMB, CKMBINDEX, TROPONINI in the last 168 hours. BNP (last 3 results) No results for input(s): PROBNP in the last 8760 hours. HbA1C: No results for input(s): HGBA1C in the last 72 hours. CBG: Recent Labs  Lab 03/21/20 1149 03/21/20 1640 03/21/20 2244 03/22/20 0725 03/22/20 1116  GLUCAP 187* 128* 124* 137* 171*   Lipid Profile: No results for input(s): CHOL, HDL, LDLCALC, TRIG, CHOLHDL, LDLDIRECT in the last 72 hours. Thyroid Function Tests: No results for input(s): TSH, T4TOTAL, FREET4, T3FREE, THYROIDAB in the last 72 hours. Anemia Panel: Recent Labs    03/22/20 0329  VITAMINB12 220  FOLATE 11.3  FERRITIN 466*  TIBC 183*  IRON 18*  RETICCTPCT 0.8   Sepsis Labs: Recent Labs  Lab 03/18/20 2001  LATICACIDVEN 1.5    Recent Results (from the past 240 hour(s))  Culture, blood (Routine x 2)     Status: None (Preliminary result)   Collection Time: 03/18/20  7:45 PM   Specimen: BLOOD  Result Value Ref Range Status   Specimen Description   Final    BLOOD RIGHT  ANTECUBITAL Performed at [facility] [address], [place], [place] [address]  Special Requests   Final    BOTTLES DRAWN AEROBIC AND ANAEROBIC Blood Culture results may not be optimal due to an excessive volume of blood received in culture bottles Performed at [facility] [address] [place], [place] [address]    Culture   Final    NO GROWTH 4 DAYS Performed at [facility], [place] [address], [place], [place] [address]    Report Status PENDING  Incomplete  Culture, blood (Routine x 2)     Status: None (Preliminary result)   Collection Time: 03/18/20  8:00 PM   Specimen: BLOOD RIGHT FOREARM  Result Value Ref Range Status   Specimen Description   Final    BLOOD RIGHT FOREARM Performed at [facility], [place] [address], [place], [place] [address]    Special Requests   Final    BOTTLES DRAWN AEROBIC AND ANAEROBIC Blood Culture adequate volume Performed at [facility] [address], [place], [place] [address]    Culture   Final    NO GROWTH 4 DAYS Performed at [facility], [place] [address] [place], [place] [address]    Report Status PENDING  Incomplete  Respiratory Panel by RT PCR (Flu A&B, Covid) - Nasopharyngeal Swab     Status: None   Collection Time: 03/18/20  8:22 PM   Specimen: Nasopharyngeal Swab  Result Value Ref Range Status   SARS Coronavirus 2 by RT PCR NEGATIVE NEGATIVE Final    Comment: (NOTE) SARS-CoV-2 target nucleic acids are NOT DETECTED.  The SARS-CoV-2 RNA is generally detectable in upper respiratoy specimens during the acute phase of infection. The lowest concentration of SARS-CoV-2 viral copies this assay can detect is 131 copies/mL. A negative result does not preclude SARS-Cov-2 infection and should not be used as the sole basis for treatment or other patient management decisions. A negative result may occur with  improper specimen collection/handling, submission of  specimen other than nasopharyngeal swab, presence of viral mutation(s) within the areas targeted by this assay, and inadequate number of viral copies (<131 copies/mL). A negative result must be combined with clinical observations, patient history, and epidemiological information. The expected result is Negative.  Fact Sheet for Patients:  PinkCheek.be  Fact Sheet for Healthcare Providers:  GravelBags.it  This test is no t yet approved or cleared by the [place] FDA and  has been authorized for detection and/or diagnosis of SARS-CoV-2 by FDA under an Emergency Use Authorization (EUA). This EUA will remain  in effect (meaning this test can be used) for the duration of the COVID-19 declaration under Section 564(b)(1) of the Act, 21 U.S.C. section 360bbb-3(b)(1), unless the authorization is terminated or revoked sooner.     Influenza A by PCR NEGATIVE NEGATIVE Final   Influenza B by PCR NEGATIVE NEGATIVE Final    Comment: (NOTE) The Xpert Xpress SARS-CoV-2/FLU/RSV assay is intended as an aid in  the diagnosis of influenza from Nasopharyngeal swab specimens and  should not be used as a sole basis for treatment. Nasal washings and  aspirates are unacceptable for Xpert Xpress SARS-CoV-2/FLU/RSV  testing.  Fact Sheet for Patients: PinkCheek.be  Fact Sheet for Healthcare Providers: GravelBags.it  This test is not yet approved or cleared by the [place] FDA and  has been authorized for detection and/or diagnosis of SARS-CoV-2 by  FDA under an Emergency Use Authorization (EUA). This EUA will remain  in effect (meaning this test can be used) for the duration of the  Covid-19 declaration under Section 564(b)(1) of the Act, 21  U.S.C. section 360bbb-3(b)(1), unless the authorization is  terminated or revoked. Performed at [facility], [place]  [address], [place], [place] [address]      RN Pressure Injury Documentation:     Estimated body mass index is 28.59 kg/m as calculated from the following:   Height as of this encounter: 5\' 9"  (1.753 m).   Weight as of this encounter: 87.8 kg.  Malnutrition Type:      Malnutrition Characteristics:      Nutrition Interventions:    Radiology Studies: No results found. Scheduled Meds: . atorvastatin  40 mg Oral q1800  . donepezil  5 mg Oral QHS  . insulin aspart  0-9 Units Subcutaneous TID WC  . iron polysaccharides  150 mg Oral Daily  . metoprolol succinate  75 mg Oral Daily  . mometasone-formoterol  2 puff Inhalation BID  . warfarin  5 mg Oral q1600  . Warfarin - Pharmacist Dosing Inpatient   Does not apply q1600   Continuous Infusions:   LOS: 2 days   Kerney Elbe, DO [facility] PAGER is on AMION  If 7PM-7AM, please contact night-coverage [email or web address]

## 2020-03-22 NOTE — Progress Notes (Signed)
Pt alert and aware in bed. Pt's door open I spoke to him and started a conversation. Pt states he is weak and has been in bed all day. He said it's hard for him to get around. The chaplain asked if he would like a word of prayer. He said yes. The chaplain offered caring and supportive presence. He sang to pt and offered prayer and blessings. The pt thank me for stopping by.

## 2020-03-22 NOTE — NC FL2 (Signed)
[place] LEVEL OF CARE SCREENING TOOL     IDENTIFICATION  Patient Name: Matthew Edwards Birthdate: 1940-09-02 Sex: male Admission Date (Current Location): 03/18/2020  [facility] and [place] Number:  Herbalist and Address:  [facility],  [place], [place]      Provider Number: [id number]  Attending Physician Name and Address:  Kerney Elbe, DO  Relative Name and Phone Number:  Gerritt, [place] Daughter   [phone] or Gerrett, [place]oman Daughter [phone] or Kiko, Ripp   [phone] or Shondell, Poulson   [phone]    Current Level of Care: Hospital Recommended Level of Care: [place] Prior Approval Number:    Date Approved/Denied:   PASRR Number: [id number]  Discharge Plan: SNF    Current Diagnoses: Patient Active Problem List   Diagnosis Date Noted  . Generalized weakness 03/20/2020  . Weakness 03/18/2020  . Lower extremity weakness 03/18/2020  . Incontinence, feces 03/12/2020  . Atrial fibrillation with RVR ([place])   . Cerumen impaction 11/23/2019  . Weight loss 11/21/2019  . Fever 12/27/2017  . Adenomatous rectal polyp with high grade dysplasia s/p TEM resection 05/26/2016 05/26/2016  . Spinal stenosis of lumbar region 11/28/2015  . OSA (obstructive sleep apnea) 11/13/2014  . Pulmonary HTN ([place]) 08/24/2014  . Acute on chronic congestive heart failure with left ventricular diastolic dysfunction ([place]) 08/02/2014  . Cognitive impairment 07/02/2014  . S/P AVR 12/29/2012  . Atrial fibrillation ([place])   . Type 2 diabetes mellitus with diabetic neuropathy, unspecified ([place]) 11/25/2011  . Vitamin D deficiency 02/21/2010  . Primary hyperparathyroidism ([place]) 06/04/2009  . Hypercalcemia 05/29/2009  . Essential hypertension 02/23/2007  . Coronary atherosclerosis 12/15/2006  . GERD 12/15/2006  . COPD (chronic obstructive pulmonary disease) ([place]) 12/10/2006  . HLD (hyperlipidemia) 07/29/2006  .  Alcohol abuse 07/29/2006  . Aortic valve disorder 07/29/2006    Orientation RESPIRATION BLADDER Height & Weight     Self, Time, Situation, Place  Normal Continent Weight: 193 lb 9.6 oz (87.8 kg) Height:  5\' 9"  (175.3 cm)  BEHAVIORAL SYMPTOMS/MOOD NEUROLOGICAL BOWEL NUTRITION STATUS      Continent Diet (Carb modified)  AMBULATORY STATUS COMMUNICATION OF NEEDS Skin   Limited Assist Verbally Normal                       Personal Care Assistance Level of Assistance  Bathing, Dressing, Feeding Bathing Assistance: Limited assistance Feeding assistance: Independent Dressing Assistance: Limited assistance     Functional Limitations Info  Sight, Hearing, Speech Sight Info: Adequate Hearing Info: Adequate Speech Info: Adequate    SPECIAL CARE FACTORS FREQUENCY  PT (By licensed PT), OT (By licensed OT)     PT Frequency: Minimum 5x a week OT Frequency: Minimum 5x a week            Contractures Contractures Info: Not present    Additional Factors Info  Code Status, Allergies, Insulin Sliding Scale Code Status Info: Full Code Allergies Info: Pineapple Concentrate   Insulin Sliding Scale Info: insulin aspart (novoLOG) injection 0-9 Units minimum of 3x a day with meals.       Current Medications (03/22/2020):  This is the current hospital active medication list Current Facility-Administered Medications  Medication Dose Route Frequency Provider Last Rate Last Admin  . acetaminophen (TYLENOL) tablet 650 mg  650 mg Oral Q6H PRN Rise Patience, MD   650 mg at 03/22/20 6948   Or  . acetaminophen (TYLENOL) suppository 650 mg  650 mg Rectal Q6H PRN Rise Patience, MD      . atorvastatin (LIPITOR) tablet 40 mg  40 mg Oral q1800 Rise Patience, MD   40 mg at 03/21/20 1739  . donepezil (ARICEPT) tablet 5 mg  5 mg Oral QHS Rise Patience, MD   5 mg at 03/21/20 2242  . insulin aspart (novoLOG) injection 0-9 Units  0-9 Units Subcutaneous TID WC Rise Patience, MD   1 Units at 03/22/20 [phone]  . iron polysaccharides (NIFEREX) capsule 150 mg  150 mg Oral Daily Raiford Noble Heartwell, DO   150 mg at 03/22/20 8657  . metoprolol succinate (TOPROL-XL) 24 hr tablet 75 mg  75 mg Oral Daily Rise Patience, MD   75 mg at 03/22/20 0948  . mometasone-formoterol (DULERA) 100-5 MCG/ACT inhaler 2 puff  2 puff Inhalation BID Rise Patience, MD   2 puff at 03/22/20 0934  . warfarin (COUMADIN) tablet 5 mg  5 mg Oral q1600 Lenis Noon, [facility]      . Warfarin - Pharmacist Dosing Inpatient   Does not apply q1600 Angela Adam [facility]         Discharge Medications: Please see discharge summary for a list of discharge medications.  Relevant Imaging Results:  Relevant Lab Results:   Additional Information SSN [id number]  Ross Ludwig, LCSW

## 2020-03-22 NOTE — TOC Progression Note (Addendum)
Transition of Care [facility]) - Progression Note    Patient Details  Name: Matthew Edwards MRN: [id number] Date of Birth: 10/23/1940  [facility] CM/SW Contact  Ross Ludwig, [place] Phone Number: 03/22/2020, 10:57 AM  Clinical Narrative:     CSW spoke with patient's daughter [name] and she stated patient has been to [facility] in the past.  Patient's daughter is interested in going to a different SNF for short term rehab and gave CSW permission to complete bed search.  CSW was informed that patient needs short term rehab and is agreeable to it.  CSW started bed search in [facility].  CSW started insurance authorization with [employer], reference number is [id number], clinicals have been faxed to insurance company.   CSW awaiting for bed offers.       Expected Discharge Plan and Services    Patient plan to go to SNF for short term rehab, then eventually return back home.                                             Social Determinants of Health (SDOH) Interventions    Readmission Risk Interventions No flowsheet data found.

## 2020-03-22 NOTE — Progress Notes (Signed)
ANTICOAGULATION CONSULT NOTE -  Consult  Pharmacy Consult for warfarin Indication: atrial fibrillation  Allergies  Allergen Reactions  . Pineapple Concentrate Nausea And Vomiting    Patient Measurements: Height: 5\' 9"  (175.3 cm) Weight: 87.8 kg (193 lb 9.6 oz) IBW/kg (Calculated) : 70.7   Vital Signs: Temp: 99.5 F (37.5 C) (10/22 0547) Temp Source: Oral (10/22 0547) BP: 124/77 (10/22 0547) Pulse Rate: 80 (10/22 0547)  Labs: Recent Labs    03/20/20 0344 03/20/20 0344 03/21/20 0340 03/22/20 0329  HGB 11.1*   < > 10.8* 10.0*  HCT 34.7*  --  35.0* 31.5*  PLT 149*  --  157 173  LABPROT 23.1*  --  23.7* 25.5*  INR 2.1*  --  2.2* 2.4*  CREATININE 0.81  --  0.80 0.80   < > = values in this interval not displayed.    Estimated Creatinine Clearance: 86.1 mL/min (by C-G formula based on SCr of 0.8 mg/dL).   Medical History: Past Medical History:  Diagnosis Date  . Arthritis   . Atrial fibrillation ([place])   . CAD (coronary artery disease) 2012   [place]-LAD 2 or 3 srents  . Complication of anesthesia    "loopy after polpy removal dec 2017, lasted several days"  . COPD (chronic obstructive pulmonary disease) ([place])   . Dementia ([place])   . GERD (gastroesophageal reflux disease)   . HEART FAILURE, CONGESTIVE UNSPEC 02/23/2007   Qualifier: Diagnosis of  By: Mellody Drown MD, [facility]    . Hyperlipidemia   . Hypertension   . OSA (obstructive sleep apnea) 11/13/2014  . S/P AVR    #25 mm Edwards pericardial valve Bioprosthetic. Dr Cyndia Bent.  . Situational depression    "son passed 11/2013"  . Sleep apnea    occ uses c pap does not know settings   . Stented coronary artery 2013  . TOBACCO DEPENDENCE 07/29/2006   Qualifier: History of  By: Carlena Sax  MD, [facility] [place]    . Type II diabetes mellitus [facility])    Assessment: 79 yo male presenting to the ED with weakness. PMH significant for atrial fibrillation (anticoagulated with warfarin PTA). Pt also has history of bioprosthetic aortic valve  replacement.   Home dose: warfarin 5 mg PO daily Last dose PTA: 10/18 @ 1800  Today, 03/22/20  CBC: Hgb slightly low but stable; Plt WNL  INR = 2.4 remains therapeutic   Diet: Heart healthy/carb modified. Meal intake not charted.  DDI: None major.   Goal of Therapy:  INR 2-3   Plan:   Continue home dose with warfarin 5 mg PO today  Decrease INR checks to q48h since they have been therapeutic for several days  At this time, recommend continuation of home warfarin dose on discharge.  Lenis Noon, PharmD 03/22/20 10:16 AM

## 2020-03-23 DIAGNOSIS — I4811 Longstanding persistent atrial fibrillation: Secondary | ICD-10-CM | POA: Diagnosis not present

## 2020-03-23 DIAGNOSIS — R509 Fever, unspecified: Secondary | ICD-10-CM | POA: Diagnosis not present

## 2020-03-23 DIAGNOSIS — R531 Weakness: Secondary | ICD-10-CM | POA: Diagnosis not present

## 2020-03-23 DIAGNOSIS — I10 Essential (primary) hypertension: Secondary | ICD-10-CM | POA: Diagnosis not present

## 2020-03-23 LAB — CBC WITH DIFFERENTIAL/PLATELET
Abs Immature Granulocytes: 0.02 10*3/uL (ref 0.00–0.07)
Basophils Absolute: 0 10*3/uL (ref 0.0–0.1)
Basophils Relative: 0 %
Eosinophils Absolute: 0.1 10*3/uL (ref 0.0–0.5)
Eosinophils Relative: 1 %
HCT: 34.1 % — ABNORMAL LOW (ref 39.0–52.0)
Hemoglobin: 10.6 g/dL — ABNORMAL LOW (ref 13.0–17.0)
Immature Granulocytes: 0 %
Lymphocytes Relative: 18 %
Lymphs Abs: 1.1 10*3/uL (ref 0.7–4.0)
MCH: 27.3 pg (ref 26.0–34.0)
MCHC: 31.1 g/dL (ref 30.0–36.0)
MCV: 87.9 fL (ref 80.0–100.0)
Monocytes Absolute: 0.5 10*3/uL (ref 0.1–1.0)
Monocytes Relative: 7 %
Neutro Abs: 4.6 10*3/uL (ref 1.7–7.7)
Neutrophils Relative %: 74 %
Platelets: 196 10*3/uL (ref 150–400)
RBC: 3.88 MIL/uL — ABNORMAL LOW (ref 4.22–5.81)
RDW: 15.4 % (ref 11.5–15.5)
WBC: 6.3 10*3/uL (ref 4.0–10.5)
nRBC: 0 % (ref 0.0–0.2)

## 2020-03-23 LAB — COMPREHENSIVE METABOLIC PANEL
ALT: 51 U/L — ABNORMAL HIGH (ref 0–44)
AST: 81 U/L — ABNORMAL HIGH (ref 15–41)
Albumin: 2.7 g/dL — ABNORMAL LOW (ref 3.5–5.0)
Alkaline Phosphatase: 69 U/L (ref 38–126)
Anion gap: 10 (ref 5–15)
BUN: 12 mg/dL (ref 8–23)
CO2: 26 mmol/L (ref 22–32)
Calcium: 9.8 mg/dL (ref 8.9–10.3)
Chloride: 98 mmol/L (ref 98–111)
Creatinine, Ser: 0.75 mg/dL (ref 0.61–1.24)
GFR, Estimated: >60 mL/min (ref >60)
Glucose, Bld: 147 mg/dL — ABNORMAL HIGH (ref 70–99)
Potassium: 3.7 mmol/L (ref 3.5–5.1)
Sodium: 134 mmol/L — ABNORMAL LOW (ref 135–145)
Total Bilirubin: 1 mg/dL (ref 0.3–1.2)
Total Protein: 6.3 g/dL — ABNORMAL LOW (ref 6.5–8.1)

## 2020-03-23 LAB — CULTURE, BLOOD (ROUTINE X 2)
Culture: NO GROWTH
Culture: NO GROWTH
Special Requests: ADEQUATE

## 2020-03-23 LAB — PROTIME-INR
INR: 2.7 — ABNORMAL HIGH (ref 0.8–1.2)
Prothrombin Time: 27.5 seconds — ABNORMAL HIGH (ref 11.4–15.2)

## 2020-03-23 LAB — GLUCOSE, CAPILLARY
Glucose-Capillary: 143 mg/dL — ABNORMAL HIGH (ref 70–99)
Glucose-Capillary: 185 mg/dL — ABNORMAL HIGH (ref 70–99)
Glucose-Capillary: 141 mg/dL — ABNORMAL HIGH (ref 70–99)
Glucose-Capillary: 159 mg/dL — ABNORMAL HIGH (ref 70–99)

## 2020-03-23 LAB — HEPATITIS PANEL, ACUTE
HCV Ab: NONREACTIVE
Hep A IgM: NONREACTIVE
Hep B C IgM: NONREACTIVE
Hepatitis B Surface Ag: NONREACTIVE

## 2020-03-23 LAB — MAGNESIUM: Magnesium: 2.5 mg/dL — ABNORMAL HIGH (ref 1.7–2.4)

## 2020-03-23 LAB — PHOSPHORUS: Phosphorus: 2.7 mg/dL (ref 2.5–4.6)

## 2020-03-23 MED ORDER — FUROSEMIDE 10 MG/ML IJ SOLN
40.0000 mg | Freq: Once | INTRAMUSCULAR | Status: AC
  Administered 2020-03-23: 40 mg via INTRAVENOUS
  Filled 2020-03-23: qty 4

## 2020-03-23 NOTE — Progress Notes (Signed)
PROGRESS NOTE    Matthew Edwards  VHQ:[id number] DOB: 1940/10/20 DOA: 03/18/2020 PCP: Lind Covert, MD   Brief Narrative:  HPI per Gean Birchwood on 03/18/20  Matthew Edwards is a 79 y.o. male with history of CAD status post CABG and stenting, A. fib, chronic incontinence of bowels after surgery in 2017 for polyp, aortic valve replacement with pericardial tissue, hypertension, dementia was brought to the ER after patient's home health aide found the patient was getting increasingly weak over the last couple of days.  About a month ago patient did have a fall and since then he was having some pain around the neck area.  Recently over the last 1 week patient was found to have some blood in the urine and patient's primary care physician had placed patient on antibiotics.  ED Course: In the ER patient had a fever of 101 F Covid test was negative.  UA still pending patient is refusing to get in and out cath.  On exam patient has good strength of the both lower extremity and good sensation.  CT head and CT lumbar spine was unremarkable.  Patient states he is feeling it difficult to ambulate because of the weakness.  Patient was empirically started on antibiotic despite patient not having any urine given since patient was having hematuria.  Patient admitted for weakness.  MRI C-spine is pending.  Labs are at baseline.  INR is therapeutic at 2.3.  Blood cultures were obtained along with urine studies ordered.  **Interim History Neurosurgery recommended further imaging which has now been done.  PT OT recommending SNF. Still awaiting further neurosurgical evaluation recommendations and their review of the lumbar spine MRI and x-ray.  I finally was able to get in touch with neurosurgery Dr. Vertell Limber and he reviewed the patient's MRI and felt that the degree of pathology is not correlating with the patient's symptoms and felt that the patient is weakness was multifactorial and not related to his  spinal findings.  Dr. Vertell Limber did not recommend surgical intervention at this time from a neurosurgery perspective.  Patient states that his weakness was little bit better and he did work with therapy yesterday.  He remains significantly confused in the setting of his dementia and thought he was at home.  We will continue to monitor him carefully.  03/23/2020: Continues to be significantly confused but is stable.  Not complaining of weakness today.  LFTs have slightly worsened and right upper quadrant ultrasound shows some hepatic cirrhosis.  We will give a dose of IV Lasix today and continue monitor his LFTs carefully.  Pending discharge to SNF once bed is available and insurance authorization is obtained.  Assessment & Plan:   Principal Problem:   Weakness Active Problems:   Essential hypertension   Type 2 diabetes mellitus with diabetic neuropathy, unspecified (HCC)   Atrial fibrillation (HCC)   Spinal stenosis of lumbar region   Fever   Lower extremity weakness   Generalized weakness  Bilateral Lower Extremity Weakness, likely multifactorial -Patient has no appreciable focal deficit on examination, CT head, lumbar spine unremarkable.   -MRI C-spine shows multilevel disc degenerative disease.   -My colleague Dr. Arcola Jansky and discussed with neurosurgery PA for Dr. Vertell Limber, he will see patient in consultation however they have just reviewed the films and will not actually see the patient.  -He recommends getting MRI lumbar spine as well as x-ray of lumbar spine 4 views.  -Patient likely has spinal stenosis contributing to patient's bilateral  lower extremity weakness. -MRI done and showed " Motion degraded study.  No visible conus abnormality. Congenitally narrow spinal canal with superimposed degenerative disease causing thecal sac compression at L4-5. No foraminal impingement."  Dr. Vertell Limber personally reviewed the MRI and feels that the thecal sac compression is mild -Lumbar X-Ray showed "No  fracture or spondylolisthesis is noted. Large osteophyte formation is noted at L3-4 and L4-5 no significant disc space narrowing is noted." -PT OT evaluating and recommending SNF and patient and family are agreeable -Further evaluation and recommendations by Neurosurgery was pending but Dr. Vertell Limber reached out to me today and feels that the degree of pathology the patient has on the MRI does not correlate with the patient's findings to explain his weakness and Dr. Vertell Limber personally feels that patient had generalized deconditioning in the setting of his dementia and has multifactorial lower extremity weakness.  He recommends continuing rehabilitative efforts and does not recommend surgical intervention at this time.  Fever, improved -Patient initially had temperature of 101 F, unclear etiology but fever has resolved now.   -UA is unremarkable and clear -Chest x-ray is clear.   -Lactic acid level 1.5 -Initially was started on IV ceftriaxone for suspected UTI but is now been discontinued given that patient is afebrile has no leukocytosis and urinalysis is clear -WBC is within normal limits and is actually trended downward from 10.5 and is now 8.5 today.   -COVID-19 is negative.  -Blood cultures x2 shows NGTD at 5 days and respiratory virus panel was negative for influenza A and B and SARS-CoV-2 -We will continue to monitor closely and follow cultures.   -Continue with acetaminophen for fever if necessary  Diabetes Mellitus Type 2 complicated by Diabetic Neuropathy -Continue sliding scale insulin with Sensitive NovoLog SSI AC -CBG's ranging from 118-171 -Last hemoglobin A1c was 6.7 and this was done 2 and half months ago -Adjust insulin regimen as necessary  Paroxysmal Atrial Fibrillation  -Heart rate is controlled,  -Continue Metoprolol, Coumadin per Pharmacy dosing; INR therapeutic at 2.7 today -Continue to Monitor on Telemetry   CAD status post CABG and stent placement History of aortic  valve replacement Chronic diastolic CHF -Continue with atorvastatin 40 mg p.o. daily, Metoprolol Succinate 25 mg p.o. daily, as well as Coumadin per pharmacy dosing -Currently denies any chest pain; if patient has some chest pain will resume his nitroglycerin 1 tab every 5 minutes as needed for chest pain -Currently holding his home furosemide as needed as he is currently not volume overloaded -Continue to monitor for signs and symptoms of volume overload and continue with strict I's and O's and daily weights" -we will give a dose of IV Lasix 40 mg x 1 given his mild extremity swelling but he is currently not in any active heart failure -strict I's and O's and daily weights and he is -344 mL since admission 193.6 and is pending to be done again.  We will repeat his weight in the morning  HLD -We will hold atorvastatin 40 mg po qHS for now given his abnormal LFTs and slightly worsening LFTs  COPD -Currently not in Exacerbation -C/w Mometasone-Formoterol 100-5 mcg/act 2 puff IH BID  Dementia -Currently has no behavioral disturbances but remains significantly confused and was a little agitated -C/w Donepezil 5 mg po qHS -Likely has generalized weakness in the setting of his underlying dementia  Hyperbilirubinemia -Mild as T Bili remains elevated though at 1.4 5 improved to 1.0 today -Likely Reactive -Continue to Monitor and Trend; since it  remains elevated will obtain a right upper quadrant ultrasound to further evaluate -Right upper quadrant ultrasound shows "Mildly increased echogenicity of hepatic parenchyma is noted suggesting hepatic cirrhosis. No other abnormality seen in the right upper quadrant of the  Abdomen" -We will avoid his hepatotoxic medications and stop his acetaminophen and hold his atorvastatin for now -Repeat CMP in the AM  Normocytic Anemia -Patient's Hgb/Hct went from 12.8/40.9 -> 10.7/34.0 -> 11.1/34.7 -> 10.8/35.0 -> 10.6/34.1 -Checked Anemia Panel and showed an  iron level of 18, U IBC 165, TIBC 183, saturation ratios of 10%, ferritin level 466, folate level 11.3, vitamin B12 level 220 -Will start Niferex 150 mg po Daily  -Continue to Monitor for S/Sx of Bleeding; Currently no overt bleeding noted -Repeat CBC in the AM  Thrombocytopenia -Patient's Platelet Count went from 159 -> 143 -> 149 -> 157 -> 173 and today is 196 -Continue to Monitor for S/Sx of Bleeding -Currently no overt bleeding noted -Repeat CBC in the AM  Abnormal LFTs, slightly worsening -Mild but slightly worsening -Patient's AST went from 29 -> 47 and is trended up to 81  -patient's ALT has gone from 30 and is now 65 -We will hold his acetaminophen and atorvastatin for now -Check RUQ U/S and Acute Hepatitis Panel; right upper quadrant ultrasound as above  -acute hepatitis panel is pending -Continue to Monitor and Trend LFT's carefully  Overweight -Estimated body mass index is 28.59 kg/m as calculated from the following:   Height as of this encounter: 5\' 9"  (1.753 m).   Weight as of this encounter: 87.8 kg. -Weight Loss and Dietary Counseling  DVT prophylaxis: Anticoagulated with Coumadin Code Status: FULL CODE Family Communication: Cussed with the patient's daughter yesterday Disposition Plan: Pending further evaluation by neurosurgery; neurosurgery feels that the patient's symptoms do not correlate with the pathology findings on MRI and feels that the weakness is multifactorial and not related to the spine and PT OT recommending skilled nursing facility so we will obtain [facility] consultation for assistance with placement  Status is: Inpatient  Remains inpatient appropriate because:Unsafe d/c plan, IV treatments appropriate due to intensity of illness or inability to take PO and Inpatient level of care appropriate due to severity of illness   Dispo: The patient is from: Home              Anticipated d/c is to: SNF              Anticipated d/c date is: 1-2 days               Patient currently is not medically stable to d/c.  Very close to being medically stable and are evaluating abnormal LFTs  Consultants:   Neurosurgery Dr. Vertell Limber evaluated the patient's films and has not actually seen the patient in consultation and recommends no surgical intervention at this time   Procedures: MRI  Antimicrobials:  Anti-infectives (From admission, onward)   Start     Dose/Rate Route Frequency Ordered Stop   03/19/20 2200  cefTRIAXone (ROCEPHIN) 1 g in sodium chloride 0.9 % 100 mL IVPB  Status:  Discontinued        1 g 200 mL/hr over 30 Minutes Intravenous Every 24 hours 03/19/20 0626 03/20/20 1524   03/18/20 2300  cefTRIAXone (ROCEPHIN) 1 g in sodium chloride 0.9 % 100 mL IVPB        1 g 200 mL/hr over 30 Minutes Intravenous  Once 03/18/20 2250 03/18/20 2352  Subjective: Seen and examined at bedside and he continues to remain confused and wanting to go home.  States that his leg weakness is not as bad today.  I asked him how he is doing and he states he is "rough".  Not able to elaborate.  No nausea or vomiting.  Has some slight lotion edema and denies any abdominal pain.  No other concerns or complaints at this time.  Objective: Vitals:   03/22/20 2052 03/22/20 2104 03/23/20 0506 03/23/20 0822  BP: 132/74  112/66   Pulse: (!) 110  94   Resp: 19  (!) 23   Temp: 98.4 F (36.9 C)  98.9 F (37.2 C)   TempSrc: Oral  Oral   SpO2: 95% 94% 96% 95%  Weight:      Height:        Intake/Output Summary (Last 24 hours) at 03/23/2020 1140 Last data filed at 03/23/2020 0847 Gross per 24 hour  Intake 60 ml  Output 500 ml  Net -440 ml   Filed Weights   03/18/20 1929  Weight: 87.8 kg   Examination: Physical Exam:  Constitutional: WN/WD overweight pleasantly confused demented African-American male currently in no acute distress appears calm but does appear slightly uncomfortable Eyes: Lids and conjunctivae normal, sclerae anicteric  ENMT: External Ears, Nose  appear normal. Grossly normal hearing.  Neck: Appears normal, supple, no cervical masses, normal ROM, no appreciable thyromegaly; no appreciable JVD Respiratory: Diminished to auscultation bilaterally, no wheezing, rales, rhonchi or crackles. Normal respiratory effort and patient is not tachypenic. No accessory muscle use.  Unlabored breathing Cardiovascular: RRR, no murmurs / rubs / gallops. S1 and S2 auscultated.  Has mild to 1+ lower extremity edema Abdomen: Soft, non-tender, distended secondary by habitus. Bowel sounds positive.  GU: Deferred. Musculoskeletal: No clubbing / cyanosis of digits/nails. No joint deformity upper and lower extremities. Skin: No rashes, lesions, ulcers on limited skin evaluation. No induration; Warm and dry.  Neurologic: CN 2-12 grossly intact with no focal deficits. Romberg sign and cerebellar reflexes not assessed.  Psychiatric: Impaired judgment and insight. Alert and oriented x 1. Normal mood and appropriate affect.   Data Reviewed: I have personally reviewed following labs and imaging studies  CBC: Recent Labs  Lab 03/18/20 2001 03/18/20 2001 03/19/20 0500 03/20/20 0344 03/21/20 0340 03/22/20 0329 03/23/20 0418  WBC 10.5   < > 9.1 8.5 7.4 6.7 6.3  NEUTROABS 8.1*  --   --   --  5.7 5.0 4.6  HGB 12.8*   < > 10.7* 11.1* 10.8* 10.0* 10.6*  HCT 40.9   < > 34.0* 34.7* 35.0* 31.5* 34.1*  MCV 89.5   < > 89.7 87.8 89.7 87.5 87.9  PLT 159   < > 143* 149* 157 173 196   < > = values in this interval not displayed.   Basic Metabolic Panel: Recent Labs  Lab 03/19/20 0500 03/20/20 0344 03/21/20 0340 03/22/20 0329 03/23/20 0418  NA 137 136 135 139 134*  K 3.8 3.7 3.7 3.6 3.7  CL 102 98 96* 100 98  CO2 28 27 28 28 26   GLUCOSE 154* 133* 169* 152* 147*  BUN 19 17 15 14 12   CREATININE 0.87 0.81 0.80 0.80 0.75  CALCIUM 9.7 9.8 10.1 9.9 9.8  MG  --   --  2.1 2.0 2.5*  PHOS  --   --  2.9 2.8 2.7   GFR: Estimated Creatinine Clearance: 86.1 mL/min (by C-G  formula based on SCr of 0.75 mg/dL).  Liver Function Tests: Recent Labs  Lab 03/19/20 0500 03/20/20 0344 03/21/20 0340 03/22/20 0329 03/23/20 0418  AST 15 24 29  47* 81*  ALT 14 15 19 30  51*  ALKPHOS 59 59 60 59 69  BILITOT 1.4* 1.4* 1.3* 1.4* 1.0  PROT 6.6 6.7 6.8 6.4* 6.3*  ALBUMIN 3.2* 3.1* 3.1* 2.8* 2.7*   No results for input(s): LIPASE, AMYLASE in the last 168 hours. No results for input(s): AMMONIA in the last 168 hours. Coagulation Profile: Recent Labs  Lab 03/19/20 0500 03/20/20 0344 03/21/20 0340 03/22/20 0329 03/23/20 0823  INR 2.4* 2.1* 2.2* 2.4* 2.7*   Cardiac Enzymes: No results for input(s): CKTOTAL, CKMB, CKMBINDEX, TROPONINI in the last 168 hours. BNP (last 3 results) No results for input(s): PROBNP in the last 8760 hours. HbA1C: No results for input(s): HGBA1C in the last 72 hours. CBG: Recent Labs  Lab 03/22/20 0725 03/22/20 1116 03/22/20 1644 03/22/20 2056 03/23/20 0722  GLUCAP 137* 171* 118* 169* 143*   Lipid Profile: No results for input(s): CHOL, HDL, LDLCALC, TRIG, CHOLHDL, LDLDIRECT in the last 72 hours. Thyroid Function Tests: No results for input(s): TSH, T4TOTAL, FREET4, T3FREE, THYROIDAB in the last 72 hours. Anemia Panel: Recent Labs    03/22/20 0329  VITAMINB12 220  FOLATE 11.3  FERRITIN 466*  TIBC 183*  IRON 18*  RETICCTPCT 0.8   Sepsis Labs: Recent Labs  Lab 03/18/20 2001  LATICACIDVEN 1.5    Recent Results (from the past 240 hour(s))  Culture, blood (Routine x 2)     Status: None   Collection Time: 03/18/20  7:45 PM   Specimen: BLOOD  Result Value Ref Range Status   Specimen Description   Final    BLOOD RIGHT ANTECUBITAL Performed at [facility] [address], [place], [place] [address]    Special Requests   Final    BOTTLES DRAWN AEROBIC AND ANAEROBIC Blood Culture results may not be optimal due to an excessive volume of blood received in culture bottles Performed at [facility] [address], [place], [place] [address]    Culture   Final    NO GROWTH 5 DAYS Performed at [facility], [place] [address], [place], [place] [address]    Report Status 03/23/2020 FINAL  Final  Culture, blood (Routine x 2)     Status: None   Collection Time: 03/18/20  8:00 PM   Specimen: BLOOD RIGHT FOREARM  Result Value Ref Range Status   Specimen Description   Final    BLOOD RIGHT FOREARM Performed at [facility], [place] [address], [place], [place] [address]    Special Requests   Final    BOTTLES DRAWN AEROBIC AND ANAEROBIC Blood Culture adequate volume Performed at [facility] [address] [place], [place] [address]    Culture   Final    NO GROWTH 5 DAYS Performed at [facility], [place] [address], [place], [place] [address]    Report Status 03/23/2020 FINAL  Final  Respiratory Panel by RT PCR (Flu A&B, Covid) - Nasopharyngeal Swab     Status: None   Collection Time: 03/18/20  8:22 PM   Specimen: Nasopharyngeal Swab  Result Value Ref Range Status   SARS Coronavirus 2 by RT PCR NEGATIVE NEGATIVE Final    Comment: (NOTE) SARS-CoV-2 target nucleic acids are NOT DETECTED.  The SARS-CoV-2 RNA is generally detectable in upper respiratoy specimens during the acute phase of infection. The  lowest concentration of SARS-CoV-2 viral copies this assay can detect is 131 copies/mL. A negative result does not preclude SARS-Cov-2 infection and should not be used as the sole basis for treatment or other patient management decisions. A negative result may occur with  improper specimen collection/handling, submission of specimen other than nasopharyngeal swab, presence of viral mutation(s) within the areas targeted by this assay, and inadequate number of viral copies (<131 copies/mL). A negative result must be combined with clinical observations, patient history, and epidemiological information. The expected result is  Negative.  Fact Sheet for Patients:  PinkCheek.be  Fact Sheet for Healthcare Providers:  GravelBags.it  This test is no t yet approved or cleared by the [place] FDA and  has been authorized for detection and/or diagnosis of SARS-CoV-2 by FDA under an Emergency Use Authorization (EUA). This EUA will remain  in effect (meaning this test can be used) for the duration of the COVID-19 declaration under Section 564(b)(1) of the Act, 21 U.S.C. section 360bbb-3(b)(1), unless the authorization is terminated or revoked sooner.     Influenza A by PCR NEGATIVE NEGATIVE Final   Influenza B by PCR NEGATIVE NEGATIVE Final    Comment: (NOTE) The Xpert Xpress SARS-CoV-2/FLU/RSV assay is intended as an aid in  the diagnosis of influenza from Nasopharyngeal swab specimens and  should not be used as a sole basis for treatment. Nasal washings and  aspirates are unacceptable for Xpert Xpress SARS-CoV-2/FLU/RSV  testing.  Fact Sheet for Patients: PinkCheek.be  Fact Sheet for Healthcare Providers: GravelBags.it  This test is not yet approved or cleared by the [place] FDA and  has been authorized for detection and/or diagnosis of SARS-CoV-2 by  FDA under an Emergency Use Authorization (EUA). This EUA will remain  in effect (meaning this test can be used) for the duration of the  Covid-19 declaration under Section 564(b)(1) of the Act, 21  U.S.C. section 360bbb-3(b)(1), unless the authorization is  terminated or revoked. Performed at [facility], [place] [address], [place], [place] [address]      RN Pressure Injury Documentation:     Estimated body mass index is 28.59 kg/m as calculated from the following:   Height as of this encounter: 5\' 9"  (1.753 m).   Weight as of this encounter: 87.8 kg.  Malnutrition Type:  Nutrition Problem: Inadequate oral  intake Etiology: lethargy/confusion, acute illness (dementia; weakness)   Malnutrition Characteristics:  Signs/Symptoms: percent weight loss Percent weight loss: 5.2 % (11 lbs x 1 month)   Nutrition Interventions:  Interventions: Glucerna shake, MVI Radiology Studies: US Abdomen Limited RUQ (LIVER/GB)  Result Date: 03/22/2020 CLINICAL DATA:  Abnormal liver function tests. EXAM: ULTRASOUND ABDOMEN LIMITED RIGHT UPPER QUADRANT COMPARISON:  December 27, 2017.  July 05, 2014. FINDINGS: Gallbladder: No gallstones or wall thickening visualized. No sonographic Murphy sign noted by sonographer. Common bile duct: Diameter: 6 mm which is within normal limits. Liver: No focal lesion identified. Mildly increased echogenicity of hepatic parenchyma is noted suggesting hepatic cirrhosis. Portal vein is patent on color Doppler imaging with normal direction of blood flow towards the liver. Other: None. IMPRESSION: Mildly increased echogenicity of hepatic parenchyma is noted suggesting hepatic cirrhosis. No other abnormality seen in the right upper quadrant of the abdomen. Electronically Signed   By: Marijo Conception M.D.   On: 03/22/2020 15:52   Scheduled Meds: . donepezil  5 mg Oral QHS  . feeding supplement (GLUCERNA SHAKE)  237 mL Oral BID BM  . insulin aspart  0-9 Units Subcutaneous TID WC  . iron polysaccharides  150 mg Oral Daily  . metoprolol succinate  75 mg Oral Daily  . mometasone-formoterol  2 puff Inhalation BID  . multivitamin with minerals  1 tablet Oral Daily  . warfarin  5 mg Oral q1600  . Warfarin - Pharmacist Dosing Inpatient   Does not apply q1600   Continuous Infusions:   LOS: 3 days   Kerney Elbe, DO [facility] PAGER is on [place]  If 7PM-7AM, please contact night-coverage [email or web address]

## 2020-03-23 NOTE — Progress Notes (Signed)
ANTICOAGULATION CONSULT NOTE -  Consult  Pharmacy Consult for warfarin Indication: atrial fibrillation  Allergies  Allergen Reactions  . Pineapple Concentrate Nausea And Vomiting    Patient Measurements: Height: 5\' 9"  (175.3 cm) Weight: 87.8 kg (193 lb 9.6 oz) IBW/kg (Calculated) : 70.7   Vital Signs: Temp: 98.9 F (37.2 C) (10/23 0506) Temp Source: Oral (10/23 0506) BP: 112/66 (10/23 0506) Pulse Rate: 94 (10/23 0506)  Labs: Recent Labs    03/21/20 0340 03/21/20 0340 03/22/20 0329 03/23/20 0418 03/23/20 0823  HGB 10.8*   < > 10.0* 10.6*  --   HCT 35.0*  --  31.5* 34.1*  --   PLT 157  --  173 196  --   LABPROT 23.7*  --  25.5*  --  27.5*  INR 2.2*  --  2.4*  --  2.7*  CREATININE 0.80  --  0.80 0.75  --    < > = values in this interval not displayed.    Estimated Creatinine Clearance: 86.1 mL/min (by C-G formula based on SCr of 0.75 mg/dL).   Medical History: Past Medical History:  Diagnosis Date  . Arthritis   . Atrial fibrillation ([place])   . CAD (coronary artery disease) 2012   [place]-LAD 2 or 3 srents  . Complication of anesthesia    "loopy after polpy removal dec 2017, lasted several days"  . COPD (chronic obstructive pulmonary disease) ([place])   . Dementia ([place])   . GERD (gastroesophageal reflux disease)   . HEART FAILURE, CONGESTIVE UNSPEC 02/23/2007   Qualifier: Diagnosis of  By: Mellody Drown MD, [facility]    . Hyperlipidemia   . Hypertension   . OSA (obstructive sleep apnea) 11/13/2014  . S/P AVR    #25 mm Edwards pericardial valve Bioprosthetic. Dr Cyndia Bent.  . Situational depression    "son passed 11/2013"  . Sleep apnea    occ uses c pap does not know settings   . Stented coronary artery 2013  . TOBACCO DEPENDENCE 07/29/2006   Qualifier: History of  By: Carlena Sax  MD, [facility]    . Type II diabetes mellitus [facility])    Assessment: 79 yo male presenting to the ED with weakness. PMH significant for atrial fibrillation (anticoagulated with warfarin PTA). Pt also  has history of bioprosthetic aortic valve replacement.   Home dose: warfarin 5 mg PO daily Last dose PTA: 10/18 @ 1800  Today, 03/23/20  CBC: Hgb slightly low but stable; Plt WNL  INR = 2.7 remains therapeutic   Diet: Heart healthy/carb modified. 50% meal intake charted.  DDI: None major.   LFTs trending up, statin held  Goal of Therapy:  INR 2-3   Plan:   Continue home dose with warfarin 5 mg PO today  INR check daily  At this time, recommend continuation of home warfarin dose on discharge.  Lenis Noon, PharmD 03/23/20 10:12 AM

## 2020-03-24 DIAGNOSIS — R509 Fever, unspecified: Secondary | ICD-10-CM | POA: Diagnosis not present

## 2020-03-24 DIAGNOSIS — I4811 Longstanding persistent atrial fibrillation: Secondary | ICD-10-CM | POA: Diagnosis not present

## 2020-03-24 DIAGNOSIS — I10 Essential (primary) hypertension: Secondary | ICD-10-CM | POA: Diagnosis not present

## 2020-03-24 DIAGNOSIS — R531 Weakness: Secondary | ICD-10-CM | POA: Diagnosis not present

## 2020-03-24 LAB — COMPREHENSIVE METABOLIC PANEL
ALT: 60 U/L — ABNORMAL HIGH (ref 0–44)
AST: 74 U/L — ABNORMAL HIGH (ref 15–41)
Albumin: 2.8 g/dL — ABNORMAL LOW (ref 3.5–5.0)
Alkaline Phosphatase: 71 U/L (ref 38–126)
Anion gap: 8 (ref 5–15)
BUN: 12 mg/dL (ref 8–23)
CO2: 29 mmol/L (ref 22–32)
Calcium: 9.8 mg/dL (ref 8.9–10.3)
Chloride: 96 mmol/L — ABNORMAL LOW (ref 98–111)
Creatinine, Ser: 0.7 mg/dL (ref 0.61–1.24)
GFR, Estimated: >60 mL/min (ref >60)
Glucose, Bld: 153 mg/dL — ABNORMAL HIGH (ref 70–99)
Potassium: 3.5 mmol/L (ref 3.5–5.1)
Sodium: 133 mmol/L — ABNORMAL LOW (ref 135–145)
Total Bilirubin: 0.8 mg/dL (ref 0.3–1.2)
Total Protein: 6.7 g/dL (ref 6.5–8.1)

## 2020-03-24 LAB — CBC WITH DIFFERENTIAL/PLATELET
Abs Immature Granulocytes: 0.02 10*3/uL (ref 0.00–0.07)
Basophils Absolute: 0 10*3/uL (ref 0.0–0.1)
Basophils Relative: 0 %
Eosinophils Absolute: 0.1 10*3/uL (ref 0.0–0.5)
Eosinophils Relative: 1 %
HCT: 32.8 % — ABNORMAL LOW (ref 39.0–52.0)
Hemoglobin: 10.3 g/dL — ABNORMAL LOW (ref 13.0–17.0)
Immature Granulocytes: 0 %
Lymphocytes Relative: 22 %
Lymphs Abs: 1.3 10*3/uL (ref 0.7–4.0)
MCH: 27.5 pg (ref 26.0–34.0)
MCHC: 31.4 g/dL (ref 30.0–36.0)
MCV: 87.5 fL (ref 80.0–100.0)
Monocytes Absolute: 0.5 10*3/uL (ref 0.1–1.0)
Monocytes Relative: 8 %
Neutro Abs: 4 10*3/uL (ref 1.7–7.7)
Neutrophils Relative %: 69 %
Platelets: 232 10*3/uL (ref 150–400)
RBC: 3.75 MIL/uL — ABNORMAL LOW (ref 4.22–5.81)
RDW: 15.3 % (ref 11.5–15.5)
WBC: 5.8 10*3/uL (ref 4.0–10.5)
nRBC: 0 % (ref 0.0–0.2)

## 2020-03-24 LAB — PROTIME-INR
INR: 2.7 — ABNORMAL HIGH (ref 0.8–1.2)
Prothrombin Time: 27.6 seconds — ABNORMAL HIGH (ref 11.4–15.2)

## 2020-03-24 LAB — PHOSPHORUS: Phosphorus: 2.4 mg/dL — ABNORMAL LOW (ref 2.5–4.6)

## 2020-03-24 LAB — GLUCOSE, CAPILLARY
Glucose-Capillary: 153 mg/dL — ABNORMAL HIGH (ref 70–99)
Glucose-Capillary: 183 mg/dL — ABNORMAL HIGH (ref 70–99)
Glucose-Capillary: 152 mg/dL — ABNORMAL HIGH (ref 70–99)
Glucose-Capillary: 170 mg/dL — ABNORMAL HIGH (ref 70–99)

## 2020-03-24 LAB — MAGNESIUM: Magnesium: 2.4 mg/dL (ref 1.7–2.4)

## 2020-03-24 MED ORDER — K PHOS MONO-SOD PHOS DI & MONO 155-852-130 MG PO TABS
500.0000 mg | ORAL_TABLET | Freq: Once | ORAL | Status: AC
  Administered 2020-03-24: 500 mg via ORAL
  Filled 2020-03-24: qty 2

## 2020-03-24 MED ORDER — IBUPROFEN 200 MG PO TABS
400.0000 mg | ORAL_TABLET | Freq: Once | ORAL | Status: AC
  Administered 2020-03-24: 400 mg via ORAL
  Filled 2020-03-24: qty 2

## 2020-03-24 NOTE — TOC Progression Note (Signed)
Transition of Care [facility]) - Progression Note    Patient Details  Name: Matthew Edwards MRN: [id number] Date of Birth: May 06, 1941  [facility] CM/SW Contact  Shade Flood, LCSW Phone Number: 03/24/2020, 11:29 AM  Clinical Narrative:     TOC following. Awaiting final bed offers for SNF and insurance authorization is still pending. Assigned TOC will follow up Monday.       Expected Discharge Plan and Services                                                 Social Determinants of Health (SDOH) Interventions    Readmission Risk Interventions No flowsheet data found.

## 2020-03-24 NOTE — Plan of Care (Signed)
  Problem: Health Behavior/Discharge Planning: Goal: Ability to manage health-related needs will improve Outcome: Progressing   Problem: Clinical Measurements: Goal: Ability to maintain clinical measurements within normal limits will improve Outcome: Progressing Goal: Will remain free from infection Outcome: Progressing Goal: Diagnostic test results will improve Outcome: Progressing   Problem: Activity: Goal: Risk for activity intolerance will decrease Outcome: Progressing   Problem: Coping: Goal: Level of anxiety will decrease Outcome: Progressing   Problem: Elimination: Goal: Will not experience complications related to bowel motility Outcome: Progressing Goal: Will not experience complications related to urinary retention Outcome: Progressing   Problem: Pain Managment: Goal: General experience of comfort will improve Outcome: Progressing   Problem: Safety: Goal: Ability to remain free from injury will improve Outcome: Progressing   Problem: Skin Integrity: Goal: Risk for impaired skin integrity will decrease Outcome: Progressing

## 2020-03-24 NOTE — Progress Notes (Signed)
ANTICOAGULATION CONSULT NOTE -  Consult  Pharmacy Consult for warfarin Indication: atrial fibrillation  Allergies  Allergen Reactions  . Pineapple Concentrate Nausea And Vomiting    Patient Measurements: Height: 5\' 9"  (175.3 cm) Weight: 87.8 kg (193 lb 9.6 oz) IBW/kg (Calculated) : 70.7   Vital Signs: Temp: 98.6 F (37 C) (10/24 0513) Temp Source: Oral (10/24 0513) BP: 108/68 (10/24 0513) Pulse Rate: 88 (10/24 0513)  Labs: Recent Labs    03/22/20 0329 03/22/20 0329 03/23/20 0418 03/23/20 0823 03/24/20 0425  HGB 10.0*   < > 10.6*  --  10.3*  HCT 31.5*  --  34.1*  --  32.8*  PLT 173  --  196  --  232  LABPROT 25.5*  --   --  27.5* 27.6*  INR 2.4*  --   --  2.7* 2.7*  CREATININE 0.80  --  0.75  --  0.70   < > = values in this interval not displayed.    Estimated Creatinine Clearance: 86.1 mL/min (by C-G formula based on SCr of 0.7 mg/dL).   Medical History: Past Medical History:  Diagnosis Date  . Arthritis   . Atrial fibrillation ([place])   . CAD (coronary artery disease) 2012   [place]-LAD 2 or 3 srents  . Complication of anesthesia    "loopy after polpy removal dec 2017, lasted several days"  . COPD (chronic obstructive pulmonary disease) ([place])   . Dementia ([place])   . GERD (gastroesophageal reflux disease)   . HEART FAILURE, CONGESTIVE UNSPEC 02/23/2007   Qualifier: Diagnosis of  By: Mellody Drown MD, [facility]    . Hyperlipidemia   . Hypertension   . OSA (obstructive sleep apnea) 11/13/2014  . S/P AVR    #25 mm Edwards pericardial valve Bioprosthetic. Dr Cyndia Bent.  . Situational depression    "son passed 11/2013"  . Sleep apnea    occ uses c pap does not know settings   . Stented coronary artery 2013  . TOBACCO DEPENDENCE 07/29/2006   Qualifier: History of  By: Carlena Sax  MD, [facility] [place]    . Type II diabetes mellitus [facility])    Assessment: 79 yo male presenting to the ED with weakness. PMH significant for atrial fibrillation (anticoagulated with warfarin PTA). Pt also has  history of bioprosthetic aortic valve replacement.   Home dose: warfarin 5 mg PO daily Last dose PTA: 10/18 @ 1800  Today, 03/24/20  CBC: Hgb slightly low but stable; Plt WNL  INR = 2.7 remains therapeutic   Diet: Heart healthy/carb modified. 50% meal intake charted.  DDI: None major.   Hepatic: LFTs elevated but trending down slightly today; Abdominal US shows "Mildly increased echogenicity of hepatic parenchyma is noted suggesting hepatic cirrhosis"  Goal of Therapy:  INR 2-3   Plan:   Continue home dose with warfarin 5 mg PO today  INR has been stable and therapeutic during admission. Given elevated LFTs, will continue with daily INR monitoring at this time  At this time, recommend continuation of home warfarin dose on discharge with INR check in 3-5 days to guide further dosing.   Lenis Noon, PharmD 03/24/20 9:12 AM

## 2020-03-24 NOTE — Plan of Care (Signed)

## 2020-03-24 NOTE — Progress Notes (Signed)
PROGRESS NOTE    Matthew Edwards  NID:[id number] DOB: 08-28-1940 DOA: 03/18/2020 PCP: Lind Covert, MD   Brief Narrative:  HPI per Gean Birchwood on 03/18/20  Matthew Edwards is a 79 y.o. male with history of CAD status post CABG and stenting, A. fib, chronic incontinence of bowels after surgery in 2017 for polyp, aortic valve replacement with pericardial tissue, hypertension, dementia was brought to the ER after patient's home health aide found the patient was getting increasingly weak over the last couple of days.  About a month ago patient did have a fall and since then he was having some pain around the neck area.  Recently over the last 1 week patient was found to have some blood in the urine and patient's primary care physician had placed patient on antibiotics.  ED Course: In the ER patient had a fever of 101 F Covid test was negative.  UA still pending patient is refusing to get in and out cath.  On exam patient has good strength of the both lower extremity and good sensation.  CT head and CT lumbar spine was unremarkable.  Patient states he is feeling it difficult to ambulate because of the weakness.  Patient was empirically started on antibiotic despite patient not having any urine given since patient was having hematuria.  Patient admitted for weakness.  MRI C-spine is pending.  Labs are at baseline.  INR is therapeutic at 2.3.  Blood cultures were obtained along with urine studies ordered.  **Interim History Neurosurgery recommended further imaging which has now been done.  PT OT recommending SNF. Still awaiting further neurosurgical evaluation recommendations and their review of the lumbar spine MRI and x-ray.  I finally was able to get in touch with neurosurgery Dr. Vertell Limber and he reviewed the patient's MRI and felt that the degree of pathology is not correlating with the patient's symptoms and felt that the patient is weakness was multifactorial and not related to his  spinal findings.  Dr. Vertell Limber did not recommend surgical intervention at this time from a neurosurgery perspective.  Patient states that his weakness was little bit better and he did work with therapy yesterday.  He remains significantly confused in the setting of his dementia and thought he was at home.  We will continue to monitor him carefully.  03/23/2020: Continues to be significantly confused but is stable.  Not complaining of weakness today.  LFTs have slightly worsened and right upper quadrant ultrasound shows some hepatic cirrhosis.  We will give a dose of IV Lasix today and continue monitor his LFTs carefully.  Pending discharge to SNF once bed is available and insurance authorization is obtained.  03/24/2020: LFTs stable essentially from yesterday. AST went from 81 -> 74 and ALT went from 51 -> 60. Sodium and Chloride mildly low in the setting of lasix. Remains Stable to D/C but awaiting final bed offers for SNF and insurance authorization  Assessment & Plan:   Principal Problem:   Weakness Active Problems:   Essential hypertension   Type 2 diabetes mellitus with diabetic neuropathy, unspecified (HCC)   Atrial fibrillation (HCC)   Spinal stenosis of lumbar region   Fever   Lower extremity weakness   Generalized weakness  Bilateral Lower Extremity Weakness, likely multifactorial -Patient has no appreciable focal deficit on examination, CT head, lumbar spine unremarkable.   -MRI C-spine shows multilevel disc degenerative disease.   -My colleague Dr. Arcola Jansky and discussed with neurosurgery PA for Dr. Vertell Limber, he will  see patient in consultation however they have just reviewed the films and will not actually see the patient.  -He recommends getting MRI lumbar spine as well as x-ray of lumbar spine 4 views.  -Patient likely has spinal stenosis contributing to patient's bilateral lower extremity weakness. -MRI done and showed " Motion degraded study.  No visible conus abnormality.  Congenitally narrow spinal canal with superimposed degenerative disease causing thecal sac compression at L4-5. No foraminal impingement."  Dr. Vertell Limber personally reviewed the MRI and feels that the thecal sac compression is mild -Lumbar X-Ray showed "No fracture or spondylolisthesis is noted. Large osteophyte formation is noted at L3-4 and L4-5 no significant disc space narrowing is noted." -PT OT evaluating and recommending SNF and patient and family are agreeable -Further evaluation and recommendations by Neurosurgery was pending but Dr. Vertell Limber reached out to me today and feels that the degree of pathology the patient has on the MRI does not correlate with the patient's findings to explain his weakness and Dr. Vertell Limber personally feels that patient had generalized deconditioning in the setting of his dementia and has multifactorial lower extremity weakness.  He recommends continuing rehabilitative efforts and does not recommend surgical intervention at this time. -Medically stable to be discharged to skilled nursing facility but are still awaiting bed offers and short of transition  Fever, improved -Patient initially had temperature of 101 F, unclear etiology but fever has resolved now.   -UA is unremarkable and clear -Chest x-ray is clear.   -Lactic acid level 1.5 -Initially was started on IV ceftriaxone for suspected UTI but is now been discontinued given that patient is afebrile has no leukocytosis and urinalysis is clear -WBC is within normal limits and today it is 5.8 -COVID-19 is negative.  -Blood cultures x2 shows NGTD at 5 days and respiratory virus panel was negative for influenza A and B and SARS-CoV-2 -We will continue to monitor closely and follow cultures.   -Continue with acetaminophen for fever if necessary  Diabetes Mellitus Type 2 complicated by Diabetic Neuropathy -Continue sliding scale insulin with Sensitive NovoLog SSI AC -CBG's ranging from 141-185 -Last hemoglobin A1c was  6.7 and this was done 2 and half months ago -Adjust insulin regimen as necessary  Paroxysmal Atrial Fibrillation  -Heart rate is controlled,  -Continue Metoprolol, Coumadin per Pharmacy dosing; INR therapeutic at 2.7 again today -Continue to Monitor on Telemetry   CAD status post CABG and stent placement History of aortic valve replacement Chronic diastolic CHF -Continue with atorvastatin 40 mg p.o. daily, Metoprolol Succinate 25 mg p.o. daily, as well as Coumadin per pharmacy dosing -Currently denies any chest pain; if patient has some chest pain will resume his nitroglycerin 1 tab every 5 minutes as needed for chest pain -Currently holding his home furosemide as needed as he is currently not volume overloaded -Continue to monitor for signs and symptoms of volume overload and continue with strict I's and O's and daily weights" -He was given a dose of IV Lasix 40 mg x 1 given his mild extremity swelling but he is currently not in any active heart failure -strict I's and O's and daily weights and he is -1,544 mL since admission 193.6 and is pending to be done again.  We will repeat his weight in the morning  HLD -We will hold atorvastatin 40 mg po qHS for now given his abnormal LFTs and slightly worsening LFTs  COPD -Currently not in Exacerbation -C/w Mometasone-Formoterol 100-5 mcg/act 2 puff IH BID  Dementia -Currently  has no behavioral disturbances but remains significantly confused and was a little agitated -C/w Donepezil 5 mg po qHS -Likely has generalized weakness in the setting of his underlying dementia  Hyperbilirubinemia -Mild as T Bili remains elevated though at 1.4 but has improved to 0.8 -Likely Reactive -Continue to Monitor and Trend; since it remains elevated will obtain a right upper quadrant ultrasound to further evaluate -Right upper quadrant ultrasound shows "Mildly increased echogenicity of hepatic parenchyma is noted suggesting hepatic cirrhosis. No other  abnormality seen in the right upper quadrant of the  Abdomen" -We will avoid his hepatotoxic medications and stop his acetaminophen and hold his atorvastatin for now -Repeat CMP in the AM  Hyponatremia/Hypochloremia -Mild as Na+ went from 134 -> 133 -Chloride Level went from 98 -> 96 -In the setting of Lasix  -Continue to Monitor and Trend -Repeat CMP in the AM   Normocytic Anemia -Patient's Hgb/Hct went from 12.8/40.9 -> 10.7/34.0 -> 11.1/34.7 -> 10.8/35.0 -> 10.6/34.-> 10.3/32.8 -Checked Anemia Panel and showed an iron level of 18, U IBC 165, TIBC 183, saturation ratios of 10%, ferritin level 466, folate level 11.3, vitamin B12 level 220 -Will start Niferex 150 mg po Daily  -Continue to Monitor for S/Sx of Bleeding; Currently no overt bleeding noted -Repeat CBC in the AM  Thrombocytopenia -Patient's Platelet Count went from 159 -> 143 -> 149 -> 157 -> 173 -> 196 -> 232 -Continue to Monitor for S/Sx of Bleeding -Currently no overt bleeding noted -Repeat CBC in the AM  Abnormal LFTs, stable -Mild but slightly worsening -Patient's AST went from 29 -> 47 and is trended up to 81 and is now 74 -patient's ALT has gone from 30 and is now 51 -> 60 -We will hold his acetaminophen and atorvastatin for now -Check RUQ U/S and Acute Hepatitis Panel; right upper quadrant ultrasound as above  -acute hepatitis panel is pending -Continue to Monitor and Trend LFT's carefully  Overweight -Estimated body mass index is 28.59 kg/m as calculated from the following:   Height as of this encounter: 5\' 9"  (1.753 m).   Weight as of this encounter: 87.8 kg. -Weight Loss and Dietary Counseling  DVT prophylaxis: Anticoagulated with Coumadin Code Status: FULL CODE Family Communication: Cussed with the patient's daughter yesterday Disposition Plan: Pending further evaluation by neurosurgery; neurosurgery feels that the patient's symptoms do not correlate with the pathology findings on MRI and feels that  the weakness is multifactorial and not related to the spine and PT OT recommending skilled nursing facility so we will obtain [facility] consultation for assistance with placement  Status is: Inpatient  Remains inpatient appropriate because:Unsafe d/c plan, IV treatments appropriate due to intensity of illness or inability to take PO and Inpatient level of care appropriate due to severity of illness   Dispo: The patient is from: Home              Anticipated d/c is to: SNF              Anticipated d/c date is: 1-2 days              Patient currently is stable to D/C   Consultants:   Neurosurgery Dr. Vertell Limber   Procedures: MRI  Antimicrobials:  Anti-infectives (From admission, onward)   Start     Dose/Rate Route Frequency Ordered Stop   03/19/20 2200  cefTRIAXone (ROCEPHIN) 1 g in sodium chloride 0.9 % 100 mL IVPB  Status:  Discontinued  1 g 200 mL/hr over 30 Minutes Intravenous Every 24 hours 03/19/20 0626 03/20/20 1524   03/18/20 2300  cefTRIAXone (ROCEPHIN) 1 g in sodium chloride 0.9 % 100 mL IVPB        1 g 200 mL/hr over 30 Minutes Intravenous  Once 03/18/20 2250 03/18/20 2352        Subjective: Seen and examined at bedside and remains confused.  States that his legs are so-so but they are not hurting today.  No nausea or vomiting.  No other concerns or complaints at this time.  He says that he is doing good but "could be better." No other concerns or complaints at this time.  Objective: Vitals:   03/23/20 2042 03/24/20 0513 03/24/20 0848 03/24/20 1301  BP:  108/68  111/74  Pulse:  88  (!) 53  Resp:  20  18  Temp:  98.6 F (37 C)  98.4 F (36.9 C)  TempSrc:  Oral  Oral  SpO2: 94% 94% 94% 97%  Weight:      Height:        Intake/Output Summary (Last 24 hours) at 03/24/2020 1406 Last data filed at 03/24/2020 1306 Gross per 24 hour  Intake 240 ml  Output 1450 ml  Net -1210 ml   Filed Weights   03/18/20 1929  Weight: 87.8 kg   Examination: Physical  Exam:  Constitutional: WN/WD overweight pleasantly confused and demented African-American male currently in no acute distress appears calm but does appear uncomfortable still. Eyes: Lids and conjunctivae normal, sclerae anicteric  ENMT: External Ears, Nose appear normal. Grossly normal hearing. Neck: Appears normal, supple, no cervical masses, normal ROM, no appreciable thyromegaly; no JVD Respiratory: Diminished to auscultation bilaterally, no wheezing, rales, rhonchi or crackles. Normal respiratory effort and patient is not tachypenic. No accessory muscle use.  Unlabored breathing Cardiovascular: RRR, no murmurs / rubs / gallops. S1 and S2 auscultated.  Minimal extremity edema Abdomen: Soft, non-tender, distended secondary by habitus. Bowel sounds positive.  GU: Deferred. Musculoskeletal: No clubbing / cyanosis of digits/nails. No joint deformity upper and lower extremities.  Skin: No rashes, lesions, ulcers on limited skin evaluation. No induration; Warm and dry.  Neurologic: CN 2-12 grossly intact with no focal deficits. Romberg sign cerebellar reflexes not assessed.  Psychiatric: Continues to have impaired judgment and insight. Alert and oriented x 1. Normal mood and appropriate affect.   Data Reviewed: I have personally reviewed following labs and imaging studies  CBC: Recent Labs  Lab 03/18/20 2001 03/19/20 0500 03/20/20 0344 03/21/20 0340 03/22/20 0329 03/23/20 0418 03/24/20 0425  WBC 10.5   < > 8.5 7.4 6.7 6.3 5.8  NEUTROABS 8.1*  --   --  5.7 5.0 4.6 4.0  HGB 12.8*   < > 11.1* 10.8* 10.0* 10.6* 10.3*  HCT 40.9   < > 34.7* 35.0* 31.5* 34.1* 32.8*  MCV 89.5   < > 87.8 89.7 87.5 87.9 87.5  PLT 159   < > 149* 157 173 196 232   < > = values in this interval not displayed.   Basic Metabolic Panel: Recent Labs  Lab 03/20/20 0344 03/21/20 0340 03/22/20 0329 03/23/20 0418 03/24/20 0425  NA 136 135 139 134* 133*  K 3.7 3.7 3.6 3.7 3.5  CL 98 96* 100 98 96*  CO2 27 28 28  26 29   GLUCOSE 133* 169* 152* 147* 153*  BUN 17 15 14 12 12   CREATININE 0.81 0.80 0.80 0.75 0.70  CALCIUM 9.8 10.1 9.9 9.8 9.8  MG  --  2.1 2.0 2.5* 2.4  PHOS  --  2.9 2.8 2.7 2.4*   GFR: Estimated Creatinine Clearance: 86.1 mL/min (by C-G formula based on SCr of 0.7 mg/dL). Liver Function Tests: Recent Labs  Lab 03/20/20 0344 03/21/20 0340 03/22/20 0329 03/23/20 0418 03/24/20 0425  AST 24 29 47* 81* 74*  ALT 15 19 30  51* 60*  ALKPHOS 59 60 59 69 71  BILITOT 1.4* 1.3* 1.4* 1.0 0.8  PROT 6.7 6.8 6.4* 6.3* 6.7  ALBUMIN 3.1* 3.1* 2.8* 2.7* 2.8*   No results for input(s): LIPASE, AMYLASE in the last 168 hours. No results for input(s): AMMONIA in the last 168 hours. Coagulation Profile: Recent Labs  Lab 03/20/20 0344 03/21/20 0340 03/22/20 0329 03/23/20 0823 03/24/20 0425  INR 2.1* 2.2* 2.4* 2.7* 2.7*   Cardiac Enzymes: No results for input(s): CKTOTAL, CKMB, CKMBINDEX, TROPONINI in the last 168 hours. BNP (last 3 results) No results for input(s): PROBNP in the last 8760 hours. HbA1C: No results for input(s): HGBA1C in the last 72 hours. CBG: Recent Labs  Lab 03/23/20 1145 03/23/20 1648 03/23/20 2008 03/24/20 0710 03/24/20 1138  GLUCAP 185* 141* 159* 153* 183*   Lipid Profile: No results for input(s): CHOL, HDL, LDLCALC, TRIG, CHOLHDL, LDLDIRECT in the last 72 hours. Thyroid Function Tests: No results for input(s): TSH, T4TOTAL, FREET4, T3FREE, THYROIDAB in the last 72 hours. Anemia Panel: Recent Labs    03/22/20 0329  VITAMINB12 220  FOLATE 11.3  FERRITIN 466*  TIBC 183*  IRON 18*  RETICCTPCT 0.8   Sepsis Labs: Recent Labs  Lab 03/18/20 2001  LATICACIDVEN 1.5    Recent Results (from the past 240 hour(s))  Culture, blood (Routine x 2)     Status: None   Collection Time: 03/18/20  7:45 PM   Specimen: BLOOD  Result Value Ref Range Status   Specimen Description   Final    BLOOD RIGHT ANTECUBITAL Performed at [facility] [address] [place], [place] [address]    Special Requests   Final    BOTTLES DRAWN AEROBIC AND ANAEROBIC Blood Culture results may not be optimal due to an excessive volume of blood received in culture bottles Performed at [facility] [address] [place], [place] [address]    Culture   Final    NO GROWTH 5 DAYS Performed at [facility], [place] [address] [place], [place] [address]    Report Status 03/23/2020 FINAL  Final  Culture, blood (Routine x 2)     Status: None   Collection Time: 03/18/20  8:00 PM   Specimen: BLOOD RIGHT FOREARM  Result Value Ref Range Status   Specimen Description   Final    BLOOD RIGHT FOREARM Performed at [facility], [place] [address] [place], [place] [address]    Special Requests   Final    BOTTLES DRAWN AEROBIC AND ANAEROBIC Blood Culture adequate volume Performed at [facility] [address], [place], [place] [address]    Culture   Final    NO GROWTH 5 DAYS Performed at [facility], [place] [address], [place],  [address]    Report Status 03/23/2020 FINAL  Final  Respiratory Panel by RT PCR (Flu A&B, Covid) - Nasopharyngeal Swab     Status: None   Collection Time: 03/18/20  8:22 PM   Specimen: Nasopharyngeal Swab  Result Value Ref Range Status   SARS Coronavirus 2 by RT  PCR NEGATIVE NEGATIVE Final    Comment: (NOTE) SARS-CoV-2 target nucleic acids are NOT DETECTED.  The SARS-CoV-2 RNA is generally detectable in upper respiratoy specimens during the acute phase of infection. The lowest concentration of SARS-CoV-2 viral copies this assay can detect is 131 copies/mL. A negative result does not preclude SARS-Cov-2 infection and should not be used as the sole basis for treatment or other patient management decisions. A negative result may occur with  improper specimen collection/handling, submission of specimen other than nasopharyngeal swab, presence of viral mutation(s)  within the areas targeted by this assay, and inadequate number of viral copies (<131 copies/mL). A negative result must be combined with clinical observations, patient history, and epidemiological information. The expected result is Negative.  Fact Sheet for Patients:  PinkCheek.be  Fact Sheet for Healthcare Providers:  GravelBags.it  This test is no t yet approved or cleared by the [place] FDA and  has been authorized for detection and/or diagnosis of SARS-CoV-2 by FDA under an Emergency Use Authorization (EUA). This EUA will remain  in effect (meaning this test can be used) for the duration of the COVID-19 declaration under Section 564(b)(1) of the Act, 21 U.S.C. section 360bbb-3(b)(1), unless the authorization is terminated or revoked sooner.     Influenza A by PCR NEGATIVE NEGATIVE Final   Influenza B by PCR NEGATIVE NEGATIVE Final    Comment: (NOTE) The Xpert Xpress SARS-CoV-2/FLU/RSV assay is intended as an aid in  the diagnosis of influenza from Nasopharyngeal swab specimens and  should not be used as a sole basis for treatment. Nasal washings and  aspirates are unacceptable for Xpert Xpress SARS-CoV-2/FLU/RSV  testing.  Fact Sheet for Patients: PinkCheek.be  Fact Sheet for Healthcare Providers: GravelBags.it  This test is not yet approved or cleared by the [place] FDA and  has been authorized for detection and/or diagnosis of SARS-CoV-2 by  FDA under an Emergency Use Authorization (EUA). This EUA will remain  in effect (meaning this test can be used) for the duration of the  Covid-19 declaration under Section 564(b)(1) of the Act, 21  U.S.C. section 360bbb-3(b)(1), unless the authorization is  terminated or revoked. Performed at [facility], [place] [address] [place], [place] [address]      RN Pressure Injury  Documentation:     Estimated body mass index is 28.59 kg/m as calculated from the following:   Height as of this encounter: 5\' 9"  (1.753 m).   Weight as of this encounter: 87.8 kg.  Malnutrition Type:  Nutrition Problem: Inadequate oral intake Etiology: lethargy/confusion, acute illness (dementia; weakness)   Malnutrition Characteristics:  Signs/Symptoms: percent weight loss Percent weight loss: 5.2 % (11 lbs x 1 month)   Nutrition Interventions:  Interventions: Glucerna shake, MVI Radiology Studies: US Abdomen Limited RUQ (LIVER/GB)  Result Date: 03/22/2020 CLINICAL DATA:  Abnormal liver function tests. EXAM: ULTRASOUND ABDOMEN LIMITED RIGHT UPPER QUADRANT COMPARISON:  December 27, 2017.  July 05, 2014. FINDINGS: Gallbladder: No gallstones or wall thickening visualized. No sonographic Murphy sign noted by sonographer. Common bile duct: Diameter: 6 mm which is within normal limits. Liver: No focal lesion identified. Mildly increased echogenicity of hepatic parenchyma is noted suggesting hepatic cirrhosis. Portal vein is patent on color Doppler imaging with normal direction of blood flow towards the liver. Other: None. IMPRESSION: Mildly increased echogenicity of hepatic parenchyma is noted suggesting hepatic cirrhosis. No other abnormality seen in the right upper quadrant of the abdomen. Electronically Signed   By: Sabino Dick  Jr M.D.   On: 03/22/2020 15:52   Scheduled Meds: . donepezil  5 mg Oral QHS  . feeding supplement (GLUCERNA SHAKE)  237 mL Oral BID BM  . insulin aspart  0-9 Units Subcutaneous TID WC  . iron polysaccharides  150 mg Oral Daily  . metoprolol succinate  75 mg Oral Daily  . mometasone-formoterol  2 puff Inhalation BID  . multivitamin with minerals  1 tablet Oral Daily  . warfarin  5 mg Oral q1600  . Warfarin - Pharmacist Dosing Inpatient   Does not apply q1600   Continuous Infusions:   LOS: 4 days   Kerney Elbe, DO [facility] PAGER is  on AMION  If 7PM-7AM, please contact night-coverage [email or web address]

## 2020-03-25 DIAGNOSIS — I10 Essential (primary) hypertension: Secondary | ICD-10-CM | POA: Diagnosis not present

## 2020-03-25 DIAGNOSIS — I4811 Longstanding persistent atrial fibrillation: Secondary | ICD-10-CM | POA: Diagnosis not present

## 2020-03-25 DIAGNOSIS — R531 Weakness: Secondary | ICD-10-CM | POA: Diagnosis not present

## 2020-03-25 DIAGNOSIS — R509 Fever, unspecified: Secondary | ICD-10-CM | POA: Diagnosis not present

## 2020-03-25 LAB — CBC WITH DIFFERENTIAL/PLATELET
Abs Immature Granulocytes: 0.01 10*3/uL (ref 0.00–0.07)
Basophils Absolute: 0 10*3/uL (ref 0.0–0.1)
Basophils Relative: 0 %
Eosinophils Absolute: 0.1 10*3/uL (ref 0.0–0.5)
Eosinophils Relative: 1 %
HCT: 33 % — ABNORMAL LOW (ref 39.0–52.0)
Hemoglobin: 10.4 g/dL — ABNORMAL LOW (ref 13.0–17.0)
Immature Granulocytes: 0 %
Lymphocytes Relative: 21 %
Lymphs Abs: 1.3 10*3/uL (ref 0.7–4.0)
MCH: 27.6 pg (ref 26.0–34.0)
MCHC: 31.5 g/dL (ref 30.0–36.0)
MCV: 87.5 fL (ref 80.0–100.0)
Monocytes Absolute: 0.4 10*3/uL (ref 0.1–1.0)
Monocytes Relative: 7 %
Neutro Abs: 4.2 10*3/uL (ref 1.7–7.7)
Neutrophils Relative %: 71 %
Platelets: 265 10*3/uL (ref 150–400)
RBC: 3.77 MIL/uL — ABNORMAL LOW (ref 4.22–5.81)
RDW: 15.4 % (ref 11.5–15.5)
WBC: 6 10*3/uL (ref 4.0–10.5)
nRBC: 0 % (ref 0.0–0.2)

## 2020-03-25 LAB — COMPREHENSIVE METABOLIC PANEL
ALT: 61 U/L — ABNORMAL HIGH (ref 0–44)
AST: 64 U/L — ABNORMAL HIGH (ref 15–41)
Albumin: 2.6 g/dL — ABNORMAL LOW (ref 3.5–5.0)
Alkaline Phosphatase: 81 U/L (ref 38–126)
Anion gap: 8 (ref 5–15)
BUN: 14 mg/dL (ref 8–23)
CO2: 30 mmol/L (ref 22–32)
Calcium: 10 mg/dL (ref 8.9–10.3)
Chloride: 98 mmol/L (ref 98–111)
Creatinine, Ser: 0.79 mg/dL (ref 0.61–1.24)
GFR, Estimated: >60 mL/min (ref >60)
Glucose, Bld: 164 mg/dL — ABNORMAL HIGH (ref 70–99)
Potassium: 3.9 mmol/L (ref 3.5–5.1)
Sodium: 136 mmol/L (ref 135–145)
Total Bilirubin: 0.7 mg/dL (ref 0.3–1.2)
Total Protein: 6.4 g/dL — ABNORMAL LOW (ref 6.5–8.1)

## 2020-03-25 LAB — PROTIME-INR
INR: 2.5 — ABNORMAL HIGH (ref 0.8–1.2)
Prothrombin Time: 26.1 seconds — ABNORMAL HIGH (ref 11.4–15.2)

## 2020-03-25 LAB — MAGNESIUM: Magnesium: 2.3 mg/dL (ref 1.7–2.4)

## 2020-03-25 LAB — GLUCOSE, CAPILLARY
Glucose-Capillary: 156 mg/dL — ABNORMAL HIGH (ref 70–99)
Glucose-Capillary: 219 mg/dL — ABNORMAL HIGH (ref 70–99)
Glucose-Capillary: 164 mg/dL — ABNORMAL HIGH (ref 70–99)
Glucose-Capillary: 125 mg/dL — ABNORMAL HIGH (ref 70–99)

## 2020-03-25 LAB — PHOSPHORUS: Phosphorus: 2.9 mg/dL (ref 2.5–4.6)

## 2020-03-25 NOTE — Care Management Important Message (Signed)
Important Message  Patient Details IM Letter given to the Patient Name: Matthew Edwards MRN: [id number] Date of Birth: [birth year]   Medicare Important Message Given:  Yes     Kerin Salen 03/25/2020, 12:28 PM

## 2020-03-25 NOTE — TOC Progression Note (Signed)
Transition of Care [facility]) - Progression Note    Patient Details  Name: JT BRABEC MRN: [id number] Date of Birth: 05-May-1941  Transition of [facility]) CM/SW Contact  Ross Ludwig, [place] Phone Number: 03/25/2020, 4:39 PM  Clinical Narrative:     CSW provided bed offers to patient's daughter Johntay Doolen, [phone] and she accepted [facility].  CSW spoke to [place] at [facility] and they are able to accept patient once insurance Josem Kaufmann has been approved, and Covid test comes back negative.  CSW contacted [facility] to update them with patient's choice, they reported that they have not received the clinicals.  CSW faxed requested clinicals again, and per Lorrin Mais at [facility] they still have not received clinicals even though confirmation was received at 3:45pm [place] time.  CSW was asked to have clinicals emailed to [place] at [email or web address].  CSW continuing to wait for insurance authorization.        Expected Discharge Plan and Services  Plan to go to SNF for short term rehab.                                               Social Determinants of Health (SDOH) Interventions    Readmission Risk Interventions No flowsheet data found.

## 2020-03-25 NOTE — Progress Notes (Signed)
ANTICOAGULATION CONSULT NOTE -  Consult  Pharmacy Consult for warfarin Indication: atrial fibrillation  Allergies  Allergen Reactions  . Pineapple Concentrate Nausea And Vomiting    Patient Measurements: Height: 5\' 9"  (175.3 cm) Weight: 87.8 kg (193 lb 9.6 oz) IBW/kg (Calculated) : 70.7   Vital Signs: Temp: 98.6 F (37 C) (10/25 0544) Temp Source: Oral (10/25 0544) BP: 113/79 (10/25 0544) Pulse Rate: 108 (10/25 0544)  Labs: Recent Labs    03/23/20 0418 03/23/20 0418 03/23/20 0823 03/24/20 0425 03/25/20 0444  HGB 10.6*   < >  --  10.3* 10.4*  HCT 34.1*  --   --  32.8* 33.0*  PLT 196  --   --  232 265  LABPROT  --   --  27.5* 27.6* 26.1*  INR  --   --  2.7* 2.7* 2.5*  CREATININE 0.75  --   --  0.70 0.79   < > = values in this interval not displayed.    Estimated Creatinine Clearance: 86.1 mL/min (by C-G formula based on SCr of 0.79 mg/dL).   Medical History: Past Medical History:  Diagnosis Date  . Arthritis   . Atrial fibrillation ([place])   . CAD (coronary artery disease) 2012   [place]-LAD 2 or 3 srents  . Complication of anesthesia    "loopy after polpy removal dec 2017, lasted several days"  . COPD (chronic obstructive pulmonary disease) ([place])   . Dementia ([place])   . GERD (gastroesophageal reflux disease)   . HEART FAILURE, CONGESTIVE UNSPEC 02/23/2007   Qualifier: Diagnosis of  By: Mellody Drown MD, [facility]    . Hyperlipidemia   . Hypertension   . OSA (obstructive sleep apnea) 11/13/2014  . S/P AVR    #25 mm Edwards pericardial valve Bioprosthetic. Dr Cyndia Bent.  . Situational depression    "son passed 11/2013"  . Sleep apnea    occ uses c pap does not know settings   . Stented coronary artery 2013  . TOBACCO DEPENDENCE 07/29/2006   Qualifier: History of  By: Carlena Sax  MD, [facility] [place]    . Type II diabetes mellitus [facility])    Assessment: 79 yo male presenting to the ED with weakness. PMH significant for atrial fibrillation (anticoagulated with warfarin PTA). Pt also  has history of bioprosthetic aortic valve replacement.   Home dose: warfarin 5 mg PO daily Last dose PTA: 10/18 @ 1800  Today, 03/25/20  CBC: Hgb slightly low but stable; Plt WNL  INR = 2.5 remains therapeutic   Diet: Heart healthy/carb modified. No meal intake charted but drinking Glucerna.  DDI: None major.   Hepatic: LFTs elevated but trending down slightly; Abdominal US shows "Mildly increased echogenicity of hepatic parenchyma is noted suggesting hepatic cirrhosis"  Goal of Therapy:  INR 2-3   Plan:   Continue home dose with warfarin 5 mg PO today  INR has been stable and therapeutic during admission. Given elevated LFTs, will continue with daily INR monitoring at this time  At this time, recommend continuation of home warfarin dose on discharge with INR check in 3-5 days to guide further dosing.   Peggyann Juba, PharmD, BCPS Pharmacy: [phone] 03/25/20 7:49 AM

## 2020-03-25 NOTE — Progress Notes (Signed)
PROGRESS NOTE    Matthew Edwards  HDQ:[id number] DOB: 12-27-1940 DOA: 03/18/2020 PCP: Lind Covert, MD   Brief Narrative:  HPI per Gean Birchwood on 03/18/20  Matthew Edwards is a 79 y.o. male with history of CAD status post CABG and stenting, A. fib, chronic incontinence of bowels after surgery in 2017 for polyp, aortic valve replacement with pericardial tissue, hypertension, dementia was brought to the ER after patient's home health aide found the patient was getting increasingly weak over the last couple of days.  About a month ago patient did have a fall and since then he was having some pain around the neck area.  Recently over the last 1 week patient was found to have some blood in the urine and patient's primary care physician had placed patient on antibiotics.  ED Course: In the ER patient had a fever of 101 F Covid test was negative.  UA still pending patient is refusing to get in and out cath.  On exam patient has good strength of the both lower extremity and good sensation.  CT head and CT lumbar spine was unremarkable.  Patient states he is feeling it difficult to ambulate because of the weakness.  Patient was empirically started on antibiotic despite patient not having any urine given since patient was having hematuria.  Patient admitted for weakness.  MRI C-spine is pending.  Labs are at baseline.  INR is therapeutic at 2.3.  Blood cultures were obtained along with urine studies ordered.  **Interim History Neurosurgery recommended further imaging which has now been done.  PT OT recommending SNF. Still awaiting further neurosurgical evaluation recommendations and their review of the lumbar spine MRI and x-ray.  I finally was able to get in touch with neurosurgery Dr. Vertell Limber and he reviewed the patient's MRI and felt that the degree of pathology is not correlating with the patient's symptoms and felt that the patient is weakness was multifactorial and not related to his  spinal findings.  Dr. Vertell Limber did not recommend surgical intervention at this time from a neurosurgery perspective.  Patient states that his weakness was little bit better and he did work with therapy yesterday.  He remains significantly confused in the setting of his dementia and thought he was at home.  We will continue to monitor him carefully.  03/23/2020: Continues to be significantly confused but is stable.  Not complaining of weakness today.  LFTs have slightly worsened and right upper quadrant ultrasound shows some hepatic cirrhosis.  We will give a dose of IV Lasix today and continue monitor his LFTs carefully.  Pending discharge to SNF once bed is available and insurance authorization is obtained.  03/24/2020: LFTs stable essentially from yesterday. AST went from 81 -> 74 and ALT went from 51 -> 60. Sodium and Chloride mildly low in the setting of lasix. Remains Stable to D/C but awaiting final bed offers for SNF and insurance authorization  03/25/2020: He remained stable and LFTs are slightly improved.  Labs are relatively unremarkable.  Still stable to be discharged to skilled nursing facility once bed and insurance authorization is obtained.  Assessment & Plan:   Principal Problem:   Weakness Active Problems:   Essential hypertension   Type 2 diabetes mellitus with diabetic neuropathy, unspecified (HCC)   Atrial fibrillation (HCC)   Spinal stenosis of lumbar region   Fever   Lower extremity weakness   Generalized weakness  Bilateral Lower Extremity Weakness, likely multifactorial -Patient has no appreciable focal deficit on  examination, CT head, lumbar spine unremarkable.   -MRI C-spine shows multilevel disc degenerative disease.   -My colleague Dr. Arcola Jansky and discussed with neurosurgery PA for Dr. Vertell Limber, he will see patient in consultation however they have just reviewed the films and will not actually see the patient.  -He recommends getting MRI lumbar spine as well as  x-ray of lumbar spine 4 views.  -Patient likely has spinal stenosis contributing to patient's bilateral lower extremity weakness. -MRI done and showed " Motion degraded study.  No visible conus abnormality. Congenitally narrow spinal canal with superimposed degenerative disease causing thecal sac compression at L4-5. No foraminal impingement."  Dr. Vertell Limber personally reviewed the MRI and feels that the thecal sac compression is mild -Lumbar X-Ray showed "No fracture or spondylolisthesis is noted. Large osteophyte formation is noted at L3-4 and L4-5 no significant disc space narrowing is noted." -PT OT evaluating and recommending SNF and patient and family are agreeable -Further evaluation and recommendations by Neurosurgery was pending but Dr. Vertell Limber reached out to me today and feels that the degree of pathology the patient has on the MRI does not correlate with the patient's findings to explain his weakness and Dr. Vertell Limber personally feels that patient had generalized deconditioning in the setting of his dementia and has multifactorial lower extremity weakness.  He recommends continuing rehabilitative efforts and does not recommend surgical intervention at this time. -Medically stable to be discharged to skilled nursing facility but are still awaiting bed offers and insurance authorization  Fever, improved and with an isolated event -Patient initially had temperature of 101 F, unclear etiology but fever has resolved now.   -UA is unremarkable and clear -Chest x-ray is clear.   -Lactic acid level 1.5 -Initially was started on IV ceftriaxone for suspected UTI but is now been discontinued given that patient is afebrile has no leukocytosis and urinalysis is clear -WBC is within normal limits and today it is 6.0 -COVID-19 is negative.  -Blood cultures x2 shows NGTD at 5 days and respiratory virus panel was negative for influenza A and B and SARS-CoV-2 -We will continue to monitor closely and follow  cultures.   -Continue with acetaminophen for fever if necessary  Diabetes Mellitus Type 2 complicated by Diabetic Neuropathy -Continue sliding scale insulin with Sensitive NovoLog SSI AC -CBG's ranging from 1 52-219 -Last hemoglobin A1c was 6.7 and this was done 2 and half months ago -Adjust insulin regimen as necessary  Paroxysmal Atrial Fibrillation  -Heart rate is controlled,  -Continue Metoprolol, Coumadin per Pharmacy dosing; INR therapeutic at 2.7 again today -Continue to Monitor on Telemetry   CAD status post CABG and stent placement History of aortic valve replacement Chronic diastolic CHF -Continue with atorvastatin 40 mg p.o. daily, Metoprolol Succinate 25 mg p.o. daily, as well as Coumadin per pharmacy dosing -Currently denies any chest pain; if patient has some chest pain will resume his nitroglycerin 1 tab every 5 minutes as needed for chest pain -Currently holding his home furosemide as needed as he is currently not volume overloaded -Continue to monitor for signs and symptoms of volume overload and continue with strict I's and O's and daily weights" -He was given a dose of IV Lasix 40 mg x 1 the day before yesterday given his mild extremity swelling but he is currently not in any active heart failure -strict I's and O's and daily weights and he is - 1.714 L since admission; weight loss 193.6 and is pending to be done again.  We  will repeat his weight in the morning  HLD -We will hold atorvastatin 40 mg po qHS for now given his abnormal LFTs and slightly worsening LFTs  COPD -Currently not in Exacerbation -C/w Mometasone-Formoterol 100-5 mcg/act 2 puff IH BID  Dementia -Currently has no behavioral disturbances but remains significantly confused and was a little agitated -C/w Donepezil 5 mg po qHS -Likely has generalized weakness in the setting of his underlying dementia  Hyperbilirubinemia -Mild as T Bili remains elevated though at 1.4 but has improved to  0.7 -Likely Reactive -Continue to Monitor and Trend; since it remains elevated will obtain a right upper quadrant ultrasound to further evaluate -Right upper quadrant ultrasound shows "Mildly increased echogenicity of hepatic parenchyma is noted suggesting hepatic cirrhosis. No other abnormality seen in the right upper quadrant of the  Abdomen" -We will avoid his hepatotoxic medications and stop his acetaminophen and hold his atorvastatin for now -Repeat CMP in the AM  Hyponatremia/Hypochloremia -Mild as Na+ went from 134 -> 133 and now sodium is 136 -Chloride Level went from 98 -> 96 and now chloride is 98 -Acutely dropped in the setting of Lasix  -Continue to Monitor and Trend -Repeat CMP in the AM   Normocytic Anemia -Patient's Hgb/Hct went from 12.8/40.9 -> 10.7/34.0 -> 11.1/34.7 -> 10.8/35.0 -> 10.6/34.-> 10.3/32.8 relatively stable today at 10.4/33.0 -Checked Anemia Panel and showed an iron level of 18, U IBC 165, TIBC 183, saturation ratios of 10%, ferritin level 466, folate level 11.3, vitamin B12 level 220 -Will start Niferex 150 mg po Daily  -Continue to Monitor for S/Sx of Bleeding; Currently no overt bleeding noted -Repeat CBC in the AM  Thrombocytopenia -Patient's Platelet Count went from 159 -> 143 -> 149 -> 157 -> 173 -> 196 -> 232 -> 265 -Continue to Monitor for S/Sx of Bleeding -Currently no overt bleeding noted -Repeat CBC in the AM  Abnormal LFTs, stable -Mild but slightly worsening -Patient's AST went from 29 -> 47 and is trended up to 81 and is now 74 and today is 64 -patient's ALT has gone from 30 and is now 51 -> 60 and today 61 -We will hold his acetaminophen and atorvastatin for now -Check RUQ U/S and Acute Hepatitis Panel; right upper quadrant ultrasound as above  -Acute hepatitis panel is negative -Continue to Monitor and Trend LFT's carefully  Overweight -Estimated body mass index is 28.59 kg/m as calculated from the following:   Height as of this  encounter: 5\' 9"  (1.753 m).   Weight as of this encounter: 87.8 kg. -Weight Loss and Dietary Counseling  DVT prophylaxis: Anticoagulated with Coumadin Code Status: FULL CODE Family Communication: Cussed with the patient's daughter yesterday Disposition Plan: Pending further evaluation by neurosurgery; neurosurgery feels that the patient's symptoms do not correlate with the pathology findings on MRI and feels that the weakness is multifactorial and not related to the spine and PT OT recommending skilled nursing facility so we will obtain [facility] consultation for assistance with placement  Status is: Inpatient  Remains inpatient appropriate because:Unsafe d/c plan, IV treatments appropriate due to intensity of illness or inability to take PO and Inpatient level of care appropriate due to severity of illness   Dispo: The patient is from: Home              Anticipated d/c is to: SNF              Anticipated d/c date is: 1 day  Patient currently is stable to D/C   Consultants:   Neurosurgery Dr. Vertell Limber   Procedures: MRI  Antimicrobials:  Anti-infectives (From admission, onward)   Start     Dose/Rate Route Frequency Ordered Stop   03/19/20 2200  cefTRIAXone (ROCEPHIN) 1 g in sodium chloride 0.9 % 100 mL IVPB  Status:  Discontinued        1 g 200 mL/hr over 30 Minutes Intravenous Every 24 hours 03/19/20 0626 03/20/20 1524   03/18/20 2300  cefTRIAXone (ROCEPHIN) 1 g in sodium chloride 0.9 % 100 mL IVPB        1 g 200 mL/hr over 30 Minutes Intravenous  Once 03/18/20 2250 03/18/20 2352        Subjective: Seen and examined at bedside and and still remains relatively confused.  No nausea or vomiting.  States that he did not sleep very well last night.  States his legs still hurt intermittently.  No nausea or vomiting.  No other concerns or complaints at this time.  Objective: Vitals:   03/24/20 1301 03/24/20 2142 03/25/20 0544 03/25/20 0845  BP: 111/74 109/76 113/79   Pulse:  (!) 53 87 (!) 108   Resp: 18 18 20    Temp: 98.4 F (36.9 C) 98.4 F (36.9 C) 98.6 F (37 C)   TempSrc: Oral Oral Oral   SpO2: 97% 95% 100% 95%  Weight:      Height:        Intake/Output Summary (Last 24 hours) at 03/25/2020 1407 Last data filed at 03/25/2020 0930 Gross per 24 hour  Intake 240 ml  Output 400 ml  Net -160 ml   Filed Weights   03/18/20 1929  Weight: 87.8 kg   Examination: Physical Exam:  Constitutional: WN/WD overweight pleasantly confused and demented African-American male currently no acute distress appears calm but does appear little uncomfortable Eyes: Lids and conjunctivae normal, sclerae anicteric  ENMT: External Ears, Nose appear normal. Grossly normal hearing.  Neck: Appears normal, supple, no cervical masses, normal ROM, no appreciable thyromegaly: No JVD Respiratory: Diminished to auscultation bilaterally, no wheezing, rales, rhonchi or crackles. Normal respiratory effort and patient is not tachypenic. No accessory muscle use.  Unlabored breathing Cardiovascular: RRR, no murmurs / rubs / gallops. S1 and S2 auscultated.  Slight extremity edema bilaterally Abdomen: Soft, non-tender, distended secondary to body habitus. Bowel sounds positive.  GU: Deferred. Musculoskeletal: No clubbing / cyanosis of digits/nails. No joint deformity upper and lower extremities.  Skin: No rashes, lesions, ulcers on limited skin evaluation. No induration; Warm and dry.  Neurologic: CN 2-12 grossly intact with no focal deficits. Romberg sign and cerebellar reflexes not assessed.  Psychiatric: Impaired judgment and insight.  He is awake but not fully oriented x 3 and remains confused. Normal mood and appropriate affect.   Data Reviewed: I have personally reviewed following labs and imaging studies  CBC: Recent Labs  Lab 03/21/20 0340 03/22/20 0329 03/23/20 0418 03/24/20 0425 03/25/20 0444  WBC 7.4 6.7 6.3 5.8 6.0  NEUTROABS 5.7 5.0 4.6 4.0 4.2  HGB 10.8* 10.0* 10.6*  10.3* 10.4*  HCT 35.0* 31.5* 34.1* 32.8* 33.0*  MCV 89.7 87.5 87.9 87.5 87.5  PLT 157 173 196 232 017   Basic Metabolic Panel: Recent Labs  Lab 03/21/20 0340 03/22/20 0329 03/23/20 0418 03/24/20 0425 03/25/20 0444  NA 135 139 134* 133* 136  K 3.7 3.6 3.7 3.5 3.9  CL 96* 100 98 96* 98  CO2 28 28 26 29 30   GLUCOSE 169* 152*  147* 153* 164*  BUN 15 14 12 12 14   CREATININE 0.80 0.80 0.75 0.70 0.79  CALCIUM 10.1 9.9 9.8 9.8 10.0  MG 2.1 2.0 2.5* 2.4 2.3  PHOS 2.9 2.8 2.7 2.4* 2.9   GFR: Estimated Creatinine Clearance: 86.1 mL/min (by C-G formula based on SCr of 0.79 mg/dL). Liver Function Tests: Recent Labs  Lab 03/21/20 0340 03/22/20 0329 03/23/20 0418 03/24/20 0425 03/25/20 0444  AST 29 47* 81* 74* 64*  ALT 19 30 51* 60* 61*  ALKPHOS 60 59 69 71 81  BILITOT 1.3* 1.4* 1.0 0.8 0.7  PROT 6.8 6.4* 6.3* 6.7 6.4*  ALBUMIN 3.1* 2.8* 2.7* 2.8* 2.6*   No results for input(s): LIPASE, AMYLASE in the last 168 hours. No results for input(s): AMMONIA in the last 168 hours. Coagulation Profile: Recent Labs  Lab 03/21/20 0340 03/22/20 0329 03/23/20 0823 03/24/20 0425 03/25/20 0444  INR 2.2* 2.4* 2.7* 2.7* 2.5*   Cardiac Enzymes: No results for input(s): CKTOTAL, CKMB, CKMBINDEX, TROPONINI in the last 168 hours. BNP (last 3 results) No results for input(s): PROBNP in the last 8760 hours. HbA1C: No results for input(s): HGBA1C in the last 72 hours. CBG: Recent Labs  Lab 03/24/20 1138 03/24/20 1635 03/24/20 2140 03/25/20 0741 03/25/20 1137  GLUCAP 183* 152* 170* 156* 219*   Lipid Profile: No results for input(s): CHOL, HDL, LDLCALC, TRIG, CHOLHDL, LDLDIRECT in the last 72 hours. Thyroid Function Tests: No results for input(s): TSH, T4TOTAL, FREET4, T3FREE, THYROIDAB in the last 72 hours. Anemia Panel: No results for input(s): VITAMINB12, FOLATE, FERRITIN, TIBC, IRON, RETICCTPCT in the last 72 hours. Sepsis Labs: Recent Labs  Lab 03/18/20 2001  LATICACIDVEN 1.5     Recent Results (from the past 240 hour(s))  Culture, blood (Routine x 2)     Status: None   Collection Time: 03/18/20  7:45 PM   Specimen: BLOOD  Result Value Ref Range Status   Specimen Description   Final    BLOOD RIGHT ANTECUBITAL Performed at [facility] [address], [place], [place] [address]    Special Requests   Final    BOTTLES DRAWN AEROBIC AND ANAEROBIC Blood Culture results may not be optimal due to an excessive volume of blood received in culture bottles Performed at [facility] [address], [place], [place] [address]    Culture   Final    NO GROWTH 5 DAYS Performed at [facility], [place] [address] [place], [place] [address]    Report Status 03/23/2020 FINAL  Final  Culture, blood (Routine x 2)     Status: None   Collection Time: 03/18/20  8:00 PM   Specimen: BLOOD RIGHT FOREARM  Result Value Ref Range Status   Specimen Description   Final    BLOOD RIGHT FOREARM Performed at [facility], [place] [address] [place], [place] [address]    Special Requests   Final    BOTTLES DRAWN AEROBIC AND ANAEROBIC Blood Culture adequate volume Performed at [facility] [address] [place], [place] [address]    Culture   Final    NO GROWTH 5 DAYS Performed at [facility], [place] [address], [place], [place] [address]    Report Status 03/23/2020 FINAL  Final  Respiratory Panel by RT PCR (Flu A&B, Covid) - Nasopharyngeal Swab     Status: None   Collection Time: 03/18/20  8:22 PM   Specimen: Nasopharyngeal Swab  Result Value Ref  Range Status   SARS Coronavirus 2 by RT PCR NEGATIVE NEGATIVE Final    Comment: (NOTE) SARS-CoV-2 target nucleic acids are NOT DETECTED.  The SARS-CoV-2 RNA is generally detectable in upper respiratoy specimens during the acute phase of infection. The lowest concentration of SARS-CoV-2 viral copies this assay can detect is 131 copies/mL. A negative result does not  preclude SARS-Cov-2 infection and should not be used as the sole basis for treatment or other patient management decisions. A negative result may occur with  improper specimen collection/handling, submission of specimen other than nasopharyngeal swab, presence of viral mutation(s) within the areas targeted by this assay, and inadequate number of viral copies (<131 copies/mL). A negative result must be combined with clinical observations, patient history, and epidemiological information. The expected result is Negative.  Fact Sheet for Patients:  PinkCheek.be  Fact Sheet for Healthcare Providers:  GravelBags.it  This test is no t yet approved or cleared by the [place] FDA and  has been authorized for detection and/or diagnosis of SARS-CoV-2 by FDA under an Emergency Use Authorization (EUA). This EUA will remain  in effect (meaning this test can be used) for the duration of the COVID-19 declaration under Section 564(b)(1) of the Act, 21 U.S.C. section 360bbb-3(b)(1), unless the authorization is terminated or revoked sooner.     Influenza A by PCR NEGATIVE NEGATIVE Final   Influenza B by PCR NEGATIVE NEGATIVE Final    Comment: (NOTE) The Xpert Xpress SARS-CoV-2/FLU/RSV assay is intended as an aid in  the diagnosis of influenza from Nasopharyngeal swab specimens and  should not be used as a sole basis for treatment. Nasal washings and  aspirates are unacceptable for Xpert Xpress SARS-CoV-2/FLU/RSV  testing.  Fact Sheet for Patients: PinkCheek.be  Fact Sheet for Healthcare Providers: GravelBags.it  This test is not yet approved or cleared by the [place] FDA and  has been authorized for detection and/or diagnosis of SARS-CoV-2 by  FDA under an Emergency Use Authorization (EUA). This EUA will remain  in effect (meaning this test can be used) for the  duration of the  Covid-19 declaration under Section 564(b)(1) of the Act, 21  U.S.C. section 360bbb-3(b)(1), unless the authorization is  terminated or revoked. Performed at [facility], [place] [address], [place], [place] [address]      RN Pressure Injury Documentation:     Estimated body mass index is 28.59 kg/m as calculated from the following:   Height as of this encounter: 5\' 9"  (1.753 m).   Weight as of this encounter: 87.8 kg.  Malnutrition Type:  Nutrition Problem: Inadequate oral intake Etiology: lethargy/confusion, acute illness (dementia; weakness)   Malnutrition Characteristics:  Signs/Symptoms: percent weight loss Percent weight loss: 5.2 % (11 lbs x 1 month)   Nutrition Interventions:  Interventions: Glucerna shake, MVI Radiology Studies: No results found. Scheduled Meds: . donepezil  5 mg Oral QHS  . feeding supplement (GLUCERNA SHAKE)  237 mL Oral BID BM  . insulin aspart  0-9 Units Subcutaneous TID WC  . iron polysaccharides  150 mg Oral Daily  . metoprolol succinate  75 mg Oral Daily  . mometasone-formoterol  2 puff Inhalation BID  . multivitamin with minerals  1 tablet Oral Daily  . warfarin  5 mg Oral q1600  . Warfarin - Pharmacist Dosing Inpatient   Does not apply q1600   Continuous Infusions:   LOS: 5 days   Kerney Elbe, DO [facility] PAGER is on [place]  If 7PM-7AM, please  contact night-coverage [email or web address]

## 2020-03-25 NOTE — Progress Notes (Signed)
Physical Therapy Treatment Patient Details Name: Matthew Edwards MRN: [id number] DOB: 1940/12/29 Today's Date: 03/25/2020    History of Present Illness 79 y.o. male with history of CAD status post CABG and stenting, A. fib, chronic incontinence of bowels after surgery in 2017 for polyp, aortic valve replacement with pericardial tissue, hypertension, dementia was brought to the ER after patient's home health aide found the patient was getting increasingly weak over the last couple of days. Recent Dx of UTI. CT showed spinal stenosis L3-4 and L4-5, MRI of cervical spine showed degenerative disc disease.    PT Comments    Attempted multiple sit to stands but pt self limiting with fear of falling and limited due to dementia.  Suspect that pt has strength/mobility to stand with some assistance but he was unable to participate.  He did laterally scoot at EOB and demonstrated more strength than with attempts to stand.  Pt did sit EOB for ~15 minutes with close supervision or min guard and participated with rolling for ADLs. Continue to progress as able.     Follow Up Recommendations  SNF;Supervision/Assistance - 24 hour     Equipment Recommendations  None recommended by PT    Recommendations for Other Services       Precautions / Restrictions Precautions Precautions: Fall Restrictions Weight Bearing Restrictions: No    Mobility  Bed Mobility Overal bed mobility: Needs Assistance Bed Mobility: Rolling;Supine to Sit;Sit to Supine Rolling: Mod assist   Supine to sit: Mod assist;HOB elevated;+2 for safety/equipment Sit to supine: Mod assist;HOB elevated;+2 for safety/equipment   General bed mobility comments: Verbal cues and positiong to facilitate all transfers. Heavy reliance on bed rails and increased time  Transfers Overall transfer level: Needs assistance   Transfers: Lateral/Scoot Transfers          Lateral/Scoot Transfers: Min assist General transfer comment: Attempted  sit to stand x 3 with pt leaning forward and UE supports on back of recliner that was positioned in front of pt backward.  Had bed elevated, visual cues to lean forward and assist of 2 with bed pad to facilitate standing but pt making no effort to initiate or assist.  Pt reports fear of falling.  Pt did laterally scoot toward HOB and was able to partially lift buttock to do this with min A.  Ambulation/Gait                 Stairs             Wheelchair Mobility    Modified Rankin (Stroke Patients Only)       Balance Overall balance assessment: Needs assistance Sitting-balance support: Feet supported;No upper extremity supported Sitting balance-Leahy Scale: Fair Sitting balance - Comments: Pt initially with posterior lean but once positioned at EOB was able to maintain for several minutes with close SBA. Postural control: Posterior lean     Standing balance comment: unable                            Cognition Arousal/Alertness: Awake/alert Behavior During Therapy: WFL for tasks assessed/performed Overall Cognitive Status: No family/caregiver present to determine baseline cognitive functioning Area of Impairment: Orientation;Problem solving;Attention;Memory;Following commands;Safety/judgement;Awareness                 Orientation Level: Disoriented to;Time;Situation Current Attention Level: Sustained Memory: Decreased short-term memory Following Commands: Follows one step commands inconsistently Safety/Judgement: Decreased awareness of deficits;Decreased awareness of safety Awareness: Intellectual Problem Solving: Slow  processing;Difficulty sequencing;Decreased initiation;Requires tactile cues;Requires verbal cues General Comments: Pt preserverating on finding his personal belongings.      Exercises      General Comments        Pertinent Vitals/Pain Pain Assessment: No/denies pain    Home Living                      Prior  Function            PT Goals (current goals can now be found in the care plan section) Acute Rehab PT Goals Patient Stated Goal: find his personal belongings PT Goal Formulation: Patient unable to participate in goal setting Time For Goal Achievement: 04/03/20 Potential to Achieve Goals: Fair Progress towards PT goals: Progressing toward goals    Frequency    Min 2X/week      PT Plan Current plan remains appropriate    Co-evaluation              AM-PAC PT "6 Clicks" Mobility   Outcome Measure  Help needed turning from your back to your side while in a flat bed without using bedrails?: A Lot Help needed moving from lying on your back to sitting on the side of a flat bed without using bedrails?: A Lot Help needed moving to and from a bed to a chair (including a wheelchair)?: Total Help needed standing up from a chair using your arms (e.g., wheelchair or bedside chair)?: Total Help needed to walk in hospital room?: Total Help needed climbing 3-5 steps with a railing? : Total 6 Click Score: 8    End of Session Equipment Utilized During Treatment: Gait belt Activity Tolerance: Patient tolerated treatment well Patient left: in bed;with call bell/phone within reach;with bed alarm set Nurse Communication: Mobility status;Need for lift equipment PT Visit Diagnosis: Difficulty in walking, not elsewhere classified (R26.2);Muscle weakness (generalized) (M62.81)     Time: 1638-4536 PT Time Calculation (min) (ACUTE ONLY): 32 min  Charges:  $Therapeutic Activity: 23-37 mins                     Abran Richard, PT Acute Rehab Services Pager [phone] [facility] Re[facility] [phone]     Karlton Lemon 03/25/2020, 4:01 PM

## 2020-03-25 NOTE — Progress Notes (Signed)
Occupational Therapy Treatment Patient Details Name: Matthew Edwards MRN: [id number] DOB: [birth year] Today's Date: 03/25/2020    History of present illness 79 y.o. male with history of CAD status post CABG and stenting, A. fib, chronic incontinence of bowels after surgery in 2017 for polyp, aortic valve replacement with pericardial tissue, hypertension, dementia was brought to the ER after patient's home health aide found the patient was getting increasingly weak over the last couple of days. Recent Dx of UTI. CT showed spinal stenosis L3-4 and L4-5, MRI of cervical spine showed degenerative disc disease.   OT comments  Patient progressing slowly but steadily and showed improved bed mobility and sitting balance at EOB with use of BUEs for self feeding with Min guard assist for intermittent posterior losses of balance, compared to previous session. Patient limited by profound generalized, esp LE weakness, confusion/disorientation, and inability to stand, along with deficits noted below. Pt continues to demonstrate good rehab potential and would benefit from continued skilled OT to increase safety and independence with ADLs and functional transfers to allow pt to return home safely and reduce caregiver burden and fall risk.   Follow Up Recommendations  SNF;Supervision/Assistance - 24 hour    Equipment Recommendations  Other (comment) (Defer to post-acute recommendations.)    Recommendations for Other Services      Precautions / Restrictions Precautions Precautions: Fall Restrictions Weight Bearing Restrictions: No       Mobility Bed Mobility Overal bed mobility: Needs Assistance Bed Mobility: Rolling;Supine to Sit;Sit to Supine Rolling: Min assist (to LT)   Supine to sit: Mod assist;HOB elevated Sit to supine: Mod assist;HOB elevated   General bed mobility comments: Verbal cues for all transitions during supine<>Sit with heavy relaince on bed rails, increased time to allow pt to  initiate movement ad lib.  Transfers Overall transfer level: Needs assistance               General transfer comment: RN in room and stated that he made 2-3 attempts to stand the pt and was unable. Advised for EOB activity.    Balance Overall balance assessment: Needs assistance Sitting-balance support: Feet supported;No upper extremity supported Sitting balance-Leahy Scale: Poor Sitting balance - Comments: Pt has posterior LOB with need of ocassional Min guard assist and multi-modal cues for anterior shift to prevent posterior LOB. Otherwise, pt sat 18 min with Min guard to supervision using [place] for feeding. Postural control: Posterior lean     Standing balance comment: unable                           ADL either performed or assessed with clinical judgement   ADL Overall ADL's : Needs assistance/impaired Eating/Feeding: Minimal assistance;Set up;Sitting;Cueing for sequencing Eating/Feeding Details (indicate cue type and reason): Pt sat EOB for 18 min and worked on self feeding, with need of total setup and containers opened for him. Verbal cues needed for pt to locate items on tray. Grooming: Bed level;Wash/dry hands;Set up;Cueing for sequencing                       Toileting- Clothing Manipulation and Hygiene: Total assistance;Bed level Toileting - Clothing Manipulation Details (indicate cue type and reason): Pt requested assist for bowel movement. Pt was able to roll to RT side with Min As and bedpan placed underneath. RN notified.     Functional mobility during ADLs: Moderate assistance;Cueing for sequencing;Cueing for safety General ADL Comments: Mod As  for bed mobility     Vision   Vision Assessment?: No apparent visual deficits Additional Comments: Cues to locate items on tray, but otherwise no obvious deficits ntoed.   Perception     Praxis      Cognition Arousal/Alertness: Awake/alert Behavior During Therapy: WFL for tasks  assessed/performed Overall Cognitive Status: No family/caregiver present to determine baseline cognitive functioning Area of Impairment: Orientation                 Orientation Level: Time;Situation             General Comments: pt able to state name and that he was in hospital. Pt asked which hospital he was in. Pt with decreased awareness of impairments.        Exercises     Shoulder Instructions       General Comments      Pertinent Vitals/ Pain       Pain Assessment: No/denies pain  Home Living                                          Prior Functioning/Environment              Frequency  Min 2X/week        Progress Toward Goals  OT Goals(current goals can now be found in the care plan section)  Progress towards OT goals: Progressing toward goals  Acute Rehab OT Goals Patient Stated Goal: Get to the bedside commode. OT Goal Formulation: With patient Time For Goal Achievement: 04/03/20 Potential to Achieve Goals: [place]  Plan Discharge plan remains appropriate    Co-evaluation                 AM-PAC OT "6 Clicks" Daily Activity     Outcome Measure   Help from another person eating meals?: A Little Help from another person taking care of personal grooming?: A Little Help from another person toileting, which includes using toliet, bedpan, or urinal?: Total Help from another person bathing (including washing, rinsing, drying)?: A Lot Help from another person to put on and taking off regular upper body clothing?: A Lot Help from another person to put on and taking off regular lower body clothing?: Total 6 Click Score: 12    End of Session    OT Visit Diagnosis: Other abnormalities of gait and mobility (R26.89);Muscle weakness (generalized) (M62.81);History of falling (Z91.81);Other symptoms and signs involving cognitive function   Activity Tolerance Patient tolerated treatment well   Patient Left in bed;with call  bell/phone within reach;with bed alarm set   Nurse Communication Mobility status        Time: 0921-0950 OT Time Calculation (min): 29 min  Charges: OT General Charges $OT Visit: 1 Visit OT Treatments $Self Care/Home Management : 8-22 mins $Therapeutic Activity: 8-22 mins  Anderson Malta, OT Acute Rehab Services Office: [phone] 03/25/2020    Julien Girt 03/25/2020, 12:34 PM

## 2020-03-26 ENCOUNTER — Ambulatory Visit: Payer: Medicare HMO | Admitting: Family Medicine

## 2020-03-26 DIAGNOSIS — I10 Essential (primary) hypertension: Secondary | ICD-10-CM | POA: Diagnosis not present

## 2020-03-26 DIAGNOSIS — R531 Weakness: Secondary | ICD-10-CM | POA: Diagnosis not present

## 2020-03-26 DIAGNOSIS — R509 Fever, unspecified: Secondary | ICD-10-CM | POA: Diagnosis not present

## 2020-03-26 DIAGNOSIS — I4811 Longstanding persistent atrial fibrillation: Secondary | ICD-10-CM | POA: Diagnosis not present

## 2020-03-26 LAB — CBC WITH DIFFERENTIAL/PLATELET
Abs Immature Granulocytes: 0.02 10*3/uL (ref 0.00–0.07)
Basophils Absolute: 0 10*3/uL (ref 0.0–0.1)
Basophils Relative: 0 %
Eosinophils Absolute: 0.1 10*3/uL (ref 0.0–0.5)
Eosinophils Relative: 2 %
HCT: 34.6 % — ABNORMAL LOW (ref 39.0–52.0)
Hemoglobin: 10.7 g/dL — ABNORMAL LOW (ref 13.0–17.0)
Immature Granulocytes: 0 %
Lymphocytes Relative: 18 %
Lymphs Abs: 1.2 10*3/uL (ref 0.7–4.0)
MCH: 27.4 pg (ref 26.0–34.0)
MCHC: 30.9 g/dL (ref 30.0–36.0)
MCV: 88.5 fL (ref 80.0–100.0)
Monocytes Absolute: 0.4 10*3/uL (ref 0.1–1.0)
Monocytes Relative: 6 %
Neutro Abs: 5 10*3/uL (ref 1.7–7.7)
Neutrophils Relative %: 74 %
Platelets: 294 10*3/uL (ref 150–400)
RBC: 3.91 MIL/uL — ABNORMAL LOW (ref 4.22–5.81)
RDW: 15.3 % (ref 11.5–15.5)
WBC: 6.8 10*3/uL (ref 4.0–10.5)
nRBC: 0 % (ref 0.0–0.2)

## 2020-03-26 LAB — COMPREHENSIVE METABOLIC PANEL
ALT: 52 U/L — ABNORMAL HIGH (ref 0–44)
AST: 43 U/L — ABNORMAL HIGH (ref 15–41)
Albumin: 2.8 g/dL — ABNORMAL LOW (ref 3.5–5.0)
Alkaline Phosphatase: 79 U/L (ref 38–126)
Anion gap: 9 (ref 5–15)
BUN: 15 mg/dL (ref 8–23)
CO2: 29 mmol/L (ref 22–32)
Calcium: 10.4 mg/dL — ABNORMAL HIGH (ref 8.9–10.3)
Chloride: 99 mmol/L (ref 98–111)
Creatinine, Ser: 0.75 mg/dL (ref 0.61–1.24)
GFR, Estimated: >60 mL/min (ref >60)
Glucose, Bld: 157 mg/dL — ABNORMAL HIGH (ref 70–99)
Potassium: 4.3 mmol/L (ref 3.5–5.1)
Sodium: 137 mmol/L (ref 135–145)
Total Bilirubin: 0.8 mg/dL (ref 0.3–1.2)
Total Protein: 6.7 g/dL (ref 6.5–8.1)

## 2020-03-26 LAB — PROTIME-INR
INR: 2.2 — ABNORMAL HIGH (ref 0.8–1.2)
Prothrombin Time: 23.7 seconds — ABNORMAL HIGH (ref 11.4–15.2)

## 2020-03-26 LAB — GLUCOSE, CAPILLARY
Glucose-Capillary: 159 mg/dL — ABNORMAL HIGH (ref 70–99)
Glucose-Capillary: 179 mg/dL — ABNORMAL HIGH (ref 70–99)
Glucose-Capillary: 154 mg/dL — ABNORMAL HIGH (ref 70–99)
Glucose-Capillary: 147 mg/dL — ABNORMAL HIGH (ref 70–99)

## 2020-03-26 LAB — PHOSPHORUS: Phosphorus: 2.8 mg/dL (ref 2.5–4.6)

## 2020-03-26 LAB — MAGNESIUM: Magnesium: 2.3 mg/dL (ref 1.7–2.4)

## 2020-03-26 MED ORDER — SODIUM CHLORIDE 0.9 % IV BOLUS
500.0000 mL | Freq: Once | INTRAVENOUS | Status: AC
  Administered 2020-03-26: 500 mL via INTRAVENOUS

## 2020-03-26 NOTE — TOC Progression Note (Addendum)
Transition of Care [facility]) - Progression Note    Patient Details  Name: Matthew Edwards MRN: [id number] Date of Birth: 1940/07/21  [facility] CM/SW Contact  Ross Ludwig, [place] Phone Number: 03/26/2020, 4:31 PM  Clinical Narrative:     CSW was informed by [facility] that they have received insurance authorization for patient and can accept him tomorrow as long as he is medically ready for discharge and Covid test is negative.  CSW updated bedside nurse and physician.  CSW updated patient's daughter [name].    Expected Discharge Plan and Services  Patient will be going to [facility] for short term rehab tomorrow pending negative Covid result.                                         Social Determinants of Health (SDOH) Interventions    Readmission Risk Interventions No flowsheet data found.

## 2020-03-26 NOTE — Progress Notes (Signed)
PROGRESS NOTE    Matthew Edwards  XBJ:[id number] DOB: Jul 10, 1940 DOA: 03/18/2020 PCP: Lind Covert, MD   Brief Narrative:  HPI per Gean Birchwood on 03/18/20  Matthew Edwards is a 79 y.o. male with history of CAD status post CABG and stenting, A. fib, chronic incontinence of bowels after surgery in 2017 for polyp, aortic valve replacement with pericardial tissue, hypertension, dementia was brought to the ER after patient's home health aide found the patient was getting increasingly weak over the last couple of days.  About a month ago patient did have a fall and since then he was having some pain around the neck area.  Recently over the last 1 week patient was found to have some blood in the urine and patient's primary care physician had placed patient on antibiotics.  ED Course: In the ER patient had a fever of 101 F Covid test was negative.  UA still pending patient is refusing to get in and out cath.  On exam patient has good strength of the both lower extremity and good sensation.  CT head and CT lumbar spine was unremarkable.  Patient states he is feeling it difficult to ambulate because of the weakness.  Patient was empirically started on antibiotic despite patient not having any urine given since patient was having hematuria.  Patient admitted for weakness.  MRI C-spine is pending.  Labs are at baseline.  INR is therapeutic at 2.3.  Blood cultures were obtained along with urine studies ordered.  **Interim History Neurosurgery recommended further imaging which has now been done.  PT OT recommending SNF. Still awaiting further neurosurgical evaluation recommendations and their review of the lumbar spine MRI and x-ray.  I finally was able to get in touch with neurosurgery Dr. Vertell Limber and he reviewed the patient's MRI and felt that the degree of pathology is not correlating with the patient's symptoms and felt that the patient is weakness was multifactorial and not related to his  spinal findings.  Dr. Vertell Limber did not recommend surgical intervention at this time from a neurosurgery perspective.  Patient states that his weakness was little bit better and he did work with therapy yesterday.  He remains significantly confused in the setting of his dementia and thought he was at home.  We will continue to monitor him carefully.  03/23/2020: Continues to be significantly confused but is stable.  Not complaining of weakness today.  LFTs have slightly worsened and right upper quadrant ultrasound shows some hepatic cirrhosis.  We will give a dose of IV Lasix today and continue monitor his LFTs carefully.  Pending discharge to SNF once bed is available and insurance authorization is obtained.  03/24/2020: LFTs stable essentially from yesterday. AST went from 81 -> 74 and ALT went from 51 -> 60. Sodium and Chloride mildly low in the setting of lasix. Remains Stable to D/C but awaiting final bed offers for SNF and insurance authorization  03/25/2020: He remained stable and LFTs are slightly improved.  Labs are relatively unremarkable.  Still stable to be discharged to skilled nursing facility once bed and insurance authorization is obtained.  03/26/2020: Will awaiting insurance authorization for SNF placement.  Calcium went up slightly so we will give him a bolus of 500 mL at a rate of 125 mL's per hour.  Currently still medically stable to be discharged to SNF and LFTs are improving.  Assessment & Plan:   Principal Problem:   Weakness Active Problems:   Essential hypertension   Type  2 diabetes mellitus with diabetic neuropathy, unspecified (HCC)   Atrial fibrillation ([place])   Spinal stenosis of lumbar region   Fever   Lower extremity weakness   Generalized weakness  Bilateral Lower Extremity Weakness, likely multifactorial -Patient has no appreciable focal deficit on examination, CT head, lumbar spine unremarkable.   -MRI C-spine shows multilevel disc degenerative disease.   -My  colleague Dr. Arcola Jansky and discussed with neurosurgery PA for Dr. Vertell Limber, he will see patient in consultation however they have just reviewed the films and will not actually see the patient.  -He recommends getting MRI lumbar spine as well as x-ray of lumbar spine 4 views.  -Patient likely has spinal stenosis contributing to patient's bilateral lower extremity weakness. -MRI done and showed " Motion degraded study.  No visible conus abnormality. Congenitally narrow spinal canal with superimposed degenerative disease causing thecal sac compression at L4-5. No foraminal impingement."  Dr. Vertell Limber personally reviewed the MRI and feels that the thecal sac compression is mild -Lumbar X-Ray showed "No fracture or spondylolisthesis is noted. Large osteophyte formation is noted at L3-4 and L4-5 no significant disc space narrowing is noted." -PT OT evaluating and recommending SNF and patient and family are agreeable -Further evaluation and recommendations by Neurosurgery was pending but Dr. Vertell Limber reached out to me today and feels that the degree of pathology the patient has on the MRI does not correlate with the patient's findings to explain his weakness and Dr. Vertell Limber personally feels that patient had generalized deconditioning in the setting of his dementia and has multifactorial lower extremity weakness.  He recommends continuing rehabilitative efforts and does not recommend surgical intervention at this time. -Medically stable to be discharged to skilled nursing facility but are still awaiting bed offers and insurance authorization  Fever, improved and with an isolated event -Patient initially had temperature of 101 F, unclear etiology but fever has resolved now.   -UA is unremarkable and clear -Chest x-ray is clear.   -Lactic acid level 1.5 -Initially was started on IV ceftriaxone for suspected UTI but is now been discontinued given that patient is afebrile has no leukocytosis and urinalysis is clear -WBC  is within normal limits and today it is 6.8 -COVID-19 is negative.  -Blood cultures x2 shows NGTD at 5 days and respiratory virus panel was negative for influenza A and B and SARS-CoV-2 -We will continue to monitor closely and follow cultures.   -Continue with acetaminophen for fever if necessary  Diabetes Mellitus Type 2 complicated by Diabetic Neuropathy -Continue sliding scale insulin with Sensitive NovoLog SSI AC -CBG's ranging from 125-219 -Last hemoglobin A1c was 6.7 and this was done 2 and half months ago -Adjust insulin regimen as necessary  Paroxysmal Atrial Fibrillation  -Heart rate is controlled,  -Continue Metoprolol, Coumadin per Pharmacy dosing; INR therapeutic at 2.2 again today -Continue to Monitor on Telemetry   CAD status post CABG and stent placement History of aortic valve replacement Chronic diastolic CHF -Continue with atorvastatin 40 mg p.o. daily, Metoprolol Succinate 25 mg p.o. daily, as well as Coumadin per pharmacy dosing -Currently denies any chest pain; if patient has some chest pain will resume his nitroglycerin 1 tab every 5 minutes as needed for chest pain -Currently holding his home furosemide as needed as he is currently not volume overloaded -Continue to monitor for signs and symptoms of volume overload and continue with strict I's and O's and daily weights" -He was given a dose of IV Lasix 40 mg x 1 on  10/23 given his mild extremity swelling but he is currently not in any active heart failure; Will now give a small Boluse of NS given HyperCalcemia  -strict I's and O's and daily weights and he is - 1.714 L since admission; weight loss 193.6 and is pending to be done again.  We will repeat his weight in the morning  HLD -We will hold atorvastatin 40 mg po qHS for now given his abnormal LFTs and slightly worsening LFTs  COPD -Currently not in Exacerbation -C/w Mometasone-Formoterol 100-5 mcg/act 2 puff IH BID  Hypercalcemia -Mild and slightly  trending up -Ca2+ was 10.4 and Corrected for an Albumin of 2.8 it is 11.4 -Given NS 500 mL Bolus at a rate of 125 mL/hr -Continue to Monitor and Trend and repeat CMP in the AM; If still elevated may consider obtaining PTH Studies and Ionized Ca2+  Dementia -Currently has no behavioral disturbances but remains significantly confused and was a little agitated -C/w Donepezil 5 mg po qHS -Likely has generalized weakness in the setting of his underlying dementia  Hyperbilirubinemia -Mild as T Bili remains elevated though at 1.4 but has improved to 0.8 now -Likely Reactive -Continue to Monitor and Trend; since it remains elevated will obtain a right upper quadrant ultrasound to further evaluate -Right upper quadrant ultrasound shows "Mildly increased echogenicity of hepatic parenchyma is noted suggesting hepatic cirrhosis. No other abnormality seen in the right upper quadrant of the  Abdomen" -We will avoid his hepatotoxic medications and stop his acetaminophen and hold his atorvastatin for now -Repeat CMP in the AM  Hyponatremia/Hypochloremia -Mild as Na+ went from 134 -> 133 -> 136 -> 137 -Chloride Level went from 98 -> 96 -> 98 -> 99 -Acutely dropped in the setting of Lasix  -Continue to Monitor and Trend -Repeat CMP in the AM   Normocytic Anemia -Patient's Hgb/Hct went from 12.8/40.9  On admission and is now relatively stable at 10.7/34.6 -Checked Anemia Panel and showed an iron level of 18, U IBC 165, TIBC 183, saturation ratios of 10%, ferritin level 466, folate level 11.3, vitamin B12 level 220 -Will start Niferex 150 mg po Daily  -Continue to Monitor for S/Sx of Bleeding; Currently no overt bleeding noted -Repeat CBC in the AM  Thrombocytopenia -Patient's Platelet Count now normalized and is 294 -Continue to Monitor for S/Sx of Bleeding -Currently no overt bleeding noted -Repeat CBC in the AM  Abnormal LFTs, stable -Mild but slightly worsening -Patient's AST went from 29 ->  47 -> 81 -> 74 -> 64 -> 43 -patient's ALT has gone from 30 -> 51 -> 60 -> 61 -> 52 -We will hold his acetaminophen and atorvastatin for now -Check RUQ U/S and Acute Hepatitis Panel; right upper quadrant ultrasound as above  -Acute hepatitis panel is negative -Continue to Monitor and Trend LFT's carefully  Overweight -Estimated body mass index is 28.59 kg/m as calculated from the following:   Height as of this encounter: 5\' 9"  (1.753 m).   Weight as of this encounter: 87.8 kg. -Weight Loss and Dietary Counseling  DVT prophylaxis: Anticoagulated with Coumadin Code Status: FULL CODE Family Communication: No family present at bedside  Disposition Plan: Pending further evaluation by neurosurgery; neurosurgery feels that the patient's symptoms do not correlate with the pathology findings on MRI and feels that the weakness is multifactorial and not related to the spine and PT OT recommending skilled nursing facility so we will obtain [facility] consultation for assistance with placement  Status is: Inpatient  Remains inpatient appropriate because:Unsafe d/c plan, IV treatments appropriate due to intensity of illness or inability to take PO and Inpatient level of care appropriate due to severity of illness   Dispo: The patient is from: Home              Anticipated d/c is to: SNF              Anticipated d/c date is: 1 day              Patient currently is stable to D/C   Consultants:   Neurosurgery Dr. Vertell Limber   Procedures: MRI  Antimicrobials:  Anti-infectives (From admission, onward)   Start     Dose/Rate Route Frequency Ordered Stop   03/19/20 2200  cefTRIAXone (ROCEPHIN) 1 g in sodium chloride 0.9 % 100 mL IVPB  Status:  Discontinued        1 g 200 mL/hr over 30 Minutes Intravenous Every 24 hours 03/19/20 0626 03/20/20 1524   03/18/20 2300  cefTRIAXone (ROCEPHIN) 1 g in sodium chloride 0.9 % 100 mL IVPB        1 g 200 mL/hr over 30 Minutes Intravenous  Once 03/18/20 2250 03/18/20 2352         Subjective: Seen and examined at bedside and still is somewhat confused but in no acute distress. Denies any nausea or vomiting. Thinks his legs are doing okay. No chest pain, lightheadedness or dizziness. No other concerns or complaints at this time  Objective: Vitals:   03/25/20 2043 03/26/20 0503 03/26/20 1030 03/26/20 1314  BP:  119/71 106/79 118/65  Pulse:  (!) 105 (!) 109 (!) 59  Resp:  20  18  Temp:  98.3 F (36.8 C)  99 F (37.2 C)  TempSrc:  Oral  Oral  SpO2: 99% 99%  97%  Weight:      Height:        Intake/Output Summary (Last 24 hours) at 03/26/2020 1501 Last data filed at 03/26/2020 6644 Gross per 24 hour  Intake 237 ml  Output 750 ml  Net -513 ml   Filed Weights   03/18/20 1929  Weight: 87.8 kg   Examination: Physical Exam:  Constitutional: WN/WD overweight pleasantly confused and demented African-American male currently no acute distress appears calm but does still appear little uncomfortable working with the nursing staff. Eyes: Lids and conjunctivae normal, sclerae anicteric  ENMT: External Ears, Nose appear normal. Grossly normal hearing.  Neck: Appears normal, supple, no cervical masses, normal ROM, no appreciable thyromegaly; no JVD Respiratory: Diminished to auscultation bilaterally, no wheezing, rales, rhonchi or crackles. Normal respiratory effort and patient is not tachypenic. No accessory muscle use.  Unlabored Cardiovascular: RRR, no murmurs / rubs / gallops. S1 and S2 auscultated.  Mild to 1+ lower extremity edema Abdomen: Soft, non-tender, distended secondary by habitus. Bowel sounds positive.  GU: Deferred. Musculoskeletal: No clubbing / cyanosis of digits/nails. No joint deformity upper and lower extremities.  Skin: No rashes, lesions, ulcers on limited skin evaluation. No induration; Warm and dry.  Neurologic: CN 2-12 grossly intact with no focal deficits. Romberg sign and cerebellar reflexes not assessed.  Psychiatric: Impaired  judgment and insight.  He is awake and alert but not fully oriented x 3. Normal mood and appropriate affect.   Data Reviewed: I have personally reviewed following labs and imaging studies  CBC: Recent Labs  Lab 03/22/20 0329 03/23/20 0418 03/24/20 0425 03/25/20 0444 03/26/20 0420  WBC 6.7 6.3 5.8 6.0 6.8  NEUTROABS 5.0  4.6 4.0 4.2 5.0  HGB 10.0* 10.6* 10.3* 10.4* 10.7*  HCT 31.5* 34.1* 32.8* 33.0* 34.6*  MCV 87.5 87.9 87.5 87.5 88.5  PLT 173 196 232 265 017   Basic Metabolic Panel: Recent Labs  Lab 03/22/20 0329 03/23/20 0418 03/24/20 0425 03/25/20 0444 03/26/20 0420  NA 139 134* 133* 136 137  K 3.6 3.7 3.5 3.9 4.3  CL 100 98 96* 98 99  CO2 28 26 29 30 29   GLUCOSE 152* 147* 153* 164* 157*  BUN 14 12 12 14 15   CREATININE 0.80 0.75 0.70 0.79 0.75  CALCIUM 9.9 9.8 9.8 10.0 10.4*  MG 2.0 2.5* 2.4 2.3 2.3  PHOS 2.8 2.7 2.4* 2.9 2.8   GFR: Estimated Creatinine Clearance: 86.1 mL/min (by C-G formula based on SCr of 0.75 mg/dL). Liver Function Tests: Recent Labs  Lab 03/22/20 0329 03/23/20 0418 03/24/20 0425 03/25/20 0444 03/26/20 0420  AST 47* 81* 74* 64* 43*  ALT 30 51* 60* 61* 52*  ALKPHOS 59 69 71 81 79  BILITOT 1.4* 1.0 0.8 0.7 0.8  PROT 6.4* 6.3* 6.7 6.4* 6.7  ALBUMIN 2.8* 2.7* 2.8* 2.6* 2.8*   No results for input(s): LIPASE, AMYLASE in the last 168 hours. No results for input(s): AMMONIA in the last 168 hours. Coagulation Profile: Recent Labs  Lab 03/22/20 0329 03/23/20 0823 03/24/20 0425 03/25/20 0444 03/26/20 0420  INR 2.4* 2.7* 2.7* 2.5* 2.2*   Cardiac Enzymes: No results for input(s): CKTOTAL, CKMB, CKMBINDEX, TROPONINI in the last 168 hours. BNP (last 3 results) No results for input(s): PROBNP in the last 8760 hours. HbA1C: No results for input(s): HGBA1C in the last 72 hours. CBG: Recent Labs  Lab 03/25/20 1137 03/25/20 1642 03/25/20 2128 03/26/20 0739 03/26/20 1206  GLUCAP 219* 164* 125* 159* 179*   Lipid Profile: No results  for input(s): CHOL, HDL, LDLCALC, TRIG, CHOLHDL, LDLDIRECT in the last 72 hours. Thyroid Function Tests: No results for input(s): TSH, T4TOTAL, FREET4, T3FREE, THYROIDAB in the last 72 hours. Anemia Panel: No results for input(s): VITAMINB12, FOLATE, FERRITIN, TIBC, IRON, RETICCTPCT in the last 72 hours. Sepsis Labs: No results for input(s): PROCALCITON, LATICACIDVEN in the last 168 hours.  Recent Results (from the past 240 hour(s))  Culture, blood (Routine x 2)     Status: None   Collection Time: 03/18/20  7:45 PM   Specimen: BLOOD  Result Value Ref Range Status   Specimen Description   Final    BLOOD RIGHT ANTECUBITAL Performed at [facility] [address] [place], [place] [address]    Special Requests   Final    BOTTLES DRAWN AEROBIC AND ANAEROBIC Blood Culture results may not be optimal due to an excessive volume of blood received in culture bottles Performed at [facility] [address], [place], [place] [address]    Culture   Final    NO GROWTH 5 DAYS Performed at [facility], [place] [address], [place], [place] [address]    Report Status 03/23/2020 FINAL  Final  Culture, blood (Routine x 2)     Status: None   Collection Time: 03/18/20  8:00 PM   Specimen: BLOOD RIGHT FOREARM  Result Value Ref Range Status   Specimen Description   Final    BLOOD RIGHT FOREARM Performed at [facility], [place] [address] [place],  [address]    Special Requests   Final    BOTTLES DRAWN AEROBIC AND ANAEROBIC Blood Culture adequate volume Performed  at [facility], [place] [address], [place], [place] [address]    Culture   Final    NO GROWTH 5 DAYS Performed at [facility], [place] [address], [place], [place] [address]    Report Status 03/23/2020 FINAL  Final  Respiratory Panel by RT PCR (Flu A&B, Covid) - Nasopharyngeal Swab     Status: None   Collection Time: 03/18/20  8:22 PM   Specimen: Nasopharyngeal  Swab  Result Value Ref Range Status   SARS Coronavirus 2 by RT PCR NEGATIVE NEGATIVE Final    Comment: (NOTE) SARS-CoV-2 target nucleic acids are NOT DETECTED.  The SARS-CoV-2 RNA is generally detectable in upper respiratoy specimens during the acute phase of infection. The lowest concentration of SARS-CoV-2 viral copies this assay can detect is 131 copies/mL. A negative result does not preclude SARS-Cov-2 infection and should not be used as the sole basis for treatment or other patient management decisions. A negative result may occur with  improper specimen collection/handling, submission of specimen other than nasopharyngeal swab, presence of viral mutation(s) within the areas targeted by this assay, and inadequate number of viral copies (<131 copies/mL). A negative result must be combined with clinical observations, patient history, and epidemiological information. The expected result is Negative.  Fact Sheet for Patients:  PinkCheek.be  Fact Sheet for Healthcare Providers:  GravelBags.it  This test is no t yet approved or cleared by the [place] FDA and  has been authorized for detection and/or diagnosis of SARS-CoV-2 by FDA under an Emergency Use Authorization (EUA). This EUA will remain  in effect (meaning this test can be used) for the duration of the COVID-19 declaration under Section 564(b)(1) of the Act, 21 U.S.C. section 360bbb-3(b)(1), unless the authorization is terminated or revoked sooner.     Influenza A by PCR NEGATIVE NEGATIVE Final   Influenza B by PCR NEGATIVE NEGATIVE Final    Comment: (NOTE) The Xpert Xpress SARS-CoV-2/FLU/RSV assay is intended as an aid in  the diagnosis of influenza from Nasopharyngeal swab specimens and  should not be used as a sole basis for treatment. Nasal washings and  aspirates are unacceptable for Xpert Xpress SARS-CoV-2/FLU/RSV  testing.  Fact Sheet for  Patients: PinkCheek.be  Fact Sheet for Healthcare Providers: GravelBags.it  This test is not yet approved or cleared by the [place] FDA and  has been authorized for detection and/or diagnosis of SARS-CoV-2 by  FDA under an Emergency Use Authorization (EUA). This EUA will remain  in effect (meaning this test can be used) for the duration of the  Covid-19 declaration under Section 564(b)(1) of the Act, 21  U.S.C. section 360bbb-3(b)(1), unless the authorization is  terminated or revoked. Performed at [facility], [employer] [address] [place], [place] [address]      RN Pressure Injury Documentation:     Estimated body mass index is 28.59 kg/m as calculated from the following:   Height as of this encounter: 5\' 9"  (1.753 m).   Weight as of this encounter: 87.8 kg.  Malnutrition Type:  Nutrition Problem: Inadequate oral intake Etiology: lethargy/confusion, acute illness (dementia; weakness)   Malnutrition Characteristics:  Signs/Symptoms: percent weight loss Percent weight loss: 5.2 % (11 lbs x 1 month)   Nutrition Interventions:  Interventions: Glucerna shake, MVI Radiology Studies: No results found. Scheduled Meds:  donepezil  5 mg Oral QHS   feeding supplement (GLUCERNA SHAKE)  237 mL Oral BID BM   insulin aspart  0-9 Units Subcutaneous TID  WC   iron polysaccharides  150 mg Oral Daily   metoprolol succinate  75 mg Oral Daily   mometasone-formoterol  2 puff Inhalation BID   multivitamin with minerals  1 tablet Oral Daily   warfarin  5 mg Oral q1600   Warfarin - Pharmacist Dosing Inpatient   Does not apply q1600   Continuous Infusions:   LOS: 6 days   Kerney Elbe, DO [facility] PAGER is on [place]  If 7PM-7AM, please contact night-coverage [email or web address]

## 2020-03-26 NOTE — Progress Notes (Signed)
ANTICOAGULATION CONSULT NOTE -  Consult  Pharmacy Consult for warfarin Indication: atrial fibrillation  Allergies  Allergen Reactions  . Pineapple Concentrate Nausea And Vomiting    Patient Measurements: Height: 5\' 9"  (175.3 cm) Weight: 87.8 kg (193 lb 9.6 oz) IBW/kg (Calculated) : 70.7   Vital Signs: Temp: 98.3 F (36.8 C) (10/26 0503) Temp Source: Oral (10/26 0503) BP: 106/79 (10/26 1030) Pulse Rate: 109 (10/26 1030)  Labs: Recent Labs    03/24/20 0425 03/24/20 0425 03/25/20 0444 03/26/20 0420  HGB 10.3*   < > 10.4* 10.7*  HCT 32.8*  --  33.0* 34.6*  PLT 232  --  265 294  LABPROT 27.6*  --  26.1* 23.7*  INR 2.7*  --  2.5* 2.2*  CREATININE 0.70  --  0.79 0.75   < > = values in this interval not displayed.    Estimated Creatinine Clearance: 86.1 mL/min (by C-G formula based on SCr of 0.75 mg/dL).   Medical History: Past Medical History:  Diagnosis Date  . Arthritis   . Atrial fibrillation ([place])   . CAD (coronary artery disease) 2012   [place]-LAD 2 or 3 srents  . Complication of anesthesia    "loopy after polpy removal dec 2017, lasted several days"  . COPD (chronic obstructive pulmonary disease) ([place])   . Dementia ([place])   . GERD (gastroesophageal reflux disease)   . HEART FAILURE, CONGESTIVE UNSPEC 02/23/2007   Qualifier: Diagnosis of  By: Mellody Drown MD, [facility]    . Hyperlipidemia   . Hypertension   . OSA (obstructive sleep apnea) 11/13/2014  . S/P AVR    #25 mm Edwards pericardial valve Bioprosthetic. Dr Cyndia Bent.  . Situational depression    "son passed 11/2013"  . Sleep apnea    occ uses c pap does not know settings   . Stented coronary artery 2013  . TOBACCO DEPENDENCE 07/29/2006   Qualifier: History of  By: Carlena Sax  MD, [facility] [place]    . Type II diabetes mellitus [facility])    Assessment: 79 yo male presenting to the ED with weakness. PMH significant for atrial fibrillation (anticoagulated with warfarin PTA). Pt also has history of bioprosthetic aortic valve  replacement.   Home dose: warfarin 5 mg PO daily Last dose PTA: 10/18 @ 1800  Today, 03/26/20  CBC: Hgb slightly low but stable; Plt WNL  INR = 2.2 remains therapeutic   Diet: Heart healthy/carb modified.  Meal intake charted 50%, but drinking Glucerna.  DDI: None major.   Hepatic: LFTs elevated but trending down slightly; Abdominal US shows "Mildly increased echogenicity of hepatic parenchyma is noted suggesting hepatic cirrhosis"  Goal of Therapy:  INR 2-3   Plan:   Continue home dose with warfarin 5 mg PO today  INR has been stable and therapeutic during admission. Given elevated LFTs, will continue with daily INR monitoring at this time  At this time, recommend continuation of home warfarin dose on discharge with INR check in 3-5 days to guide further dosing.   Gretta Arab PharmD, BCPS Clinical Pharmacist [facility] main pharmacy [phone] 03/26/2020 11:20 AM

## 2020-03-27 DIAGNOSIS — R531 Weakness: Secondary | ICD-10-CM | POA: Diagnosis not present

## 2020-03-27 DIAGNOSIS — J449 Chronic obstructive pulmonary disease, unspecified: Secondary | ICD-10-CM | POA: Diagnosis not present

## 2020-03-27 DIAGNOSIS — I5030 Unspecified diastolic (congestive) heart failure: Secondary | ICD-10-CM | POA: Diagnosis not present

## 2020-03-27 DIAGNOSIS — I2510 Atherosclerotic heart disease of native coronary artery without angina pectoris: Secondary | ICD-10-CM | POA: Diagnosis not present

## 2020-03-27 DIAGNOSIS — F0390 Unspecified dementia without behavioral disturbance: Secondary | ICD-10-CM | POA: Diagnosis not present

## 2020-03-27 DIAGNOSIS — Z7901 Long term (current) use of anticoagulants: Secondary | ICD-10-CM | POA: Diagnosis not present

## 2020-03-27 DIAGNOSIS — I10 Essential (primary) hypertension: Secondary | ICD-10-CM | POA: Diagnosis not present

## 2020-03-27 DIAGNOSIS — N390 Urinary tract infection, site not specified: Secondary | ICD-10-CM | POA: Diagnosis not present

## 2020-03-27 DIAGNOSIS — W19XXXA Unspecified fall, initial encounter: Secondary | ICD-10-CM | POA: Diagnosis not present

## 2020-03-27 DIAGNOSIS — E119 Type 2 diabetes mellitus without complications: Secondary | ICD-10-CM | POA: Diagnosis not present

## 2020-03-27 DIAGNOSIS — Z7401 Bed confinement status: Secondary | ICD-10-CM | POA: Diagnosis not present

## 2020-03-27 DIAGNOSIS — R269 Unspecified abnormalities of gait and mobility: Secondary | ICD-10-CM | POA: Diagnosis not present

## 2020-03-27 DIAGNOSIS — E1140 Type 2 diabetes mellitus with diabetic neuropathy, unspecified: Secondary | ICD-10-CM | POA: Diagnosis not present

## 2020-03-27 DIAGNOSIS — Z79899 Other long term (current) drug therapy: Secondary | ICD-10-CM | POA: Diagnosis not present

## 2020-03-27 DIAGNOSIS — D6869 Other thrombophilia: Secondary | ICD-10-CM | POA: Diagnosis not present

## 2020-03-27 DIAGNOSIS — L988 Other specified disorders of the skin and subcutaneous tissue: Secondary | ICD-10-CM | POA: Diagnosis not present

## 2020-03-27 DIAGNOSIS — G4733 Obstructive sleep apnea (adult) (pediatric): Secondary | ICD-10-CM | POA: Diagnosis not present

## 2020-03-27 DIAGNOSIS — M2550 Pain in unspecified joint: Secondary | ICD-10-CM | POA: Diagnosis not present

## 2020-03-27 DIAGNOSIS — R5381 Other malaise: Secondary | ICD-10-CM | POA: Diagnosis not present

## 2020-03-27 DIAGNOSIS — I4891 Unspecified atrial fibrillation: Secondary | ICD-10-CM | POA: Diagnosis not present

## 2020-03-27 LAB — CBC WITH DIFFERENTIAL/PLATELET
Abs Immature Granulocytes: 0.02 10*3/uL (ref 0.00–0.07)
Basophils Absolute: 0 10*3/uL (ref 0.0–0.1)
Basophils Relative: 0 %
Eosinophils Absolute: 0.1 10*3/uL (ref 0.0–0.5)
Eosinophils Relative: 2 %
HCT: 32.8 % — ABNORMAL LOW (ref 39.0–52.0)
Hemoglobin: 10.1 g/dL — ABNORMAL LOW (ref 13.0–17.0)
Immature Granulocytes: 0 %
Lymphocytes Relative: 20 %
Lymphs Abs: 1.4 10*3/uL (ref 0.7–4.0)
MCH: 27.4 pg (ref 26.0–34.0)
MCHC: 30.8 g/dL (ref 30.0–36.0)
MCV: 89.1 fL (ref 80.0–100.0)
Monocytes Absolute: 0.4 10*3/uL (ref 0.1–1.0)
Monocytes Relative: 6 %
Neutro Abs: 4.9 10*3/uL (ref 1.7–7.7)
Neutrophils Relative %: 72 %
Platelets: 300 10*3/uL (ref 150–400)
RBC: 3.68 MIL/uL — ABNORMAL LOW (ref 4.22–5.81)
RDW: 15.4 % (ref 11.5–15.5)
WBC: 6.9 10*3/uL (ref 4.0–10.5)
nRBC: 0 % (ref 0.0–0.2)

## 2020-03-27 LAB — PROTIME-INR
INR: 2.5 — ABNORMAL HIGH (ref 0.8–1.2)
Prothrombin Time: 26.3 seconds — ABNORMAL HIGH (ref 11.4–15.2)

## 2020-03-27 LAB — RESPIRATORY PANEL BY RT PCR (FLU A&B, COVID)
Influenza A by PCR: NEGATIVE
Influenza B by PCR: NEGATIVE
SARS Coronavirus 2 by RT PCR: NEGATIVE

## 2020-03-27 LAB — COMPREHENSIVE METABOLIC PANEL
ALT: 39 U/L (ref 0–44)
AST: 26 U/L (ref 15–41)
Albumin: 2.7 g/dL — ABNORMAL LOW (ref 3.5–5.0)
Alkaline Phosphatase: 66 U/L (ref 38–126)
Anion gap: 10 (ref 5–15)
BUN: 12 mg/dL (ref 8–23)
CO2: 27 mmol/L (ref 22–32)
Calcium: 10.4 mg/dL — ABNORMAL HIGH (ref 8.9–10.3)
Chloride: 99 mmol/L (ref 98–111)
Creatinine, Ser: 0.64 mg/dL (ref 0.61–1.24)
GFR, Estimated: >60 mL/min (ref >60)
Glucose, Bld: 149 mg/dL — ABNORMAL HIGH (ref 70–99)
Potassium: 4.1 mmol/L (ref 3.5–5.1)
Sodium: 136 mmol/L (ref 135–145)
Total Bilirubin: 0.7 mg/dL (ref 0.3–1.2)
Total Protein: 6.2 g/dL — ABNORMAL LOW (ref 6.5–8.1)

## 2020-03-27 LAB — GLUCOSE, CAPILLARY
Glucose-Capillary: 142 mg/dL — ABNORMAL HIGH (ref 70–99)
Glucose-Capillary: 236 mg/dL — ABNORMAL HIGH (ref 70–99)

## 2020-03-27 LAB — VITAMIN D 25 HYDROXY (VIT D DEFICIENCY, FRACTURES): Vit D, 25-Hydroxy: 27.3 ng/mL — ABNORMAL LOW (ref 30–100)

## 2020-03-27 LAB — PHOSPHORUS: Phosphorus: 2.7 mg/dL (ref 2.5–4.6)

## 2020-03-27 LAB — MAGNESIUM: Magnesium: 2.2 mg/dL (ref 1.7–2.4)

## 2020-03-27 MED ORDER — METOPROLOL SUCCINATE ER 100 MG PO TB24: 100.0000 mg | ORAL_TABLET | Freq: Every day | ORAL | Status: DC

## 2020-03-27 MED ORDER — POLYSACCHARIDE IRON COMPLEX 150 MG PO CAPS
150.0000 mg | ORAL_CAPSULE | Freq: Every day | ORAL | Status: DC
Start: 2020-03-28 — End: 2020-11-19

## 2020-03-27 NOTE — Progress Notes (Signed)
ANTICOAGULATION CONSULT NOTE -  Consult  Pharmacy Consult for warfarin Indication: atrial fibrillation  Allergies  Allergen Reactions  . Pineapple Concentrate Nausea And Vomiting    Patient Measurements: Height: 5\' 9"  (175.3 cm) Weight: 87.Matthew kg (193 lb 3.2 oz) IBW/kg (Calculated) : 70.7   Vital Signs: Temp: 98.3 F (36.8 C) (10/27 0434) Temp Source: Oral (10/27 0434) BP: 107/60 (10/27 0919) Pulse Rate: 119 (10/27 0919)  Labs: Recent Labs    03/25/20 0444 03/25/20 0444 03/26/20 0420 03/27/20 0401  HGB 10.4*   < > 10.7* 10.1*  HCT 33.0*  --  34.Matthew* 32.8*  PLT 265  --  294 300  LABPROT 26.1*  --  23.7* 26.3*  INR 2.5*  --  2.2* 2.5*  CREATININE 0.79  --  0.75 0.64   < > = values in this interval not displayed.    Estimated Creatinine Clearance: 86.1 mL/min (by C-G formula based on SCr of 0.64 mg/dL).   Medical History: Past Medical History:  Diagnosis Date  . Arthritis   . Atrial fibrillation ([place])   . CAD (coronary artery disease) 2012   [place]-LAD 2 or 3 srents  . Complication of anesthesia    "loopy after polpy removal dec 2017, lasted several days"  . COPD (chronic obstructive pulmonary disease) ([place])   . Dementia ([place])   . GERD (gastroesophageal reflux disease)   . HEART FAILURE, CONGESTIVE UNSPEC 02/23/2007   Qualifier: Diagnosis of  By: Mellody Drown MD, [facility]    . Hyperlipidemia   . Hypertension   . OSA (obstructive sleep apnea) Matthew/14/2016  . S/P AVR    #25 mm [place] pericardial valve Bioprosthetic. Dr Cyndia Bent.  . Situational depression    "son passed 11/2013"  . Sleep apnea    occ uses c pap does not know settings   . Stented coronary artery 2013  . TOBACCO DEPENDENCE 07/29/2006   Qualifier: History of  By: Carlena Sax  MD, [facility] [place]    . Type II diabetes mellitus [facility])    Assessment: 79 yo Edwards presenting to the ED with weakness. PMH significant for atrial fibrillation (anticoagulated with warfarin PTA). Pt also has history of bioprosthetic aortic valve  replacement.   Home dose: warfarin 5 mg PO daily Last dose PTA: 10/18 @ 1800  Today, 03/27/20  CBC: Hgb slightly low but stable; Plt WNL  INR = 2.5 remains therapeutic   Diet: Heart healthy/carb modified.  Meal intake charted 50-75%, and drinking Glucerna.  DDI: None major.   Hepatic: LFTs elevated but now improved to WNL; Abdominal US shows "Mildly increased echogenicity of hepatic parenchyma is noted suggesting hepatic cirrhosis"  Goal of Therapy:  INR 2-3   Plan:   Continue home dose with warfarin 5 mg PO today  INR has been stable and therapeutic during admission. Given elevated LFTs, will continue with daily INR monitoring at this time  At this time, recommend continuation of home warfarin dose on discharge with INR check in 3-5 days to guide further dosing.   Gretta Arab PharmD, BCPS Clinical Pharmacist [facility] main pharmacy [phone] 03/27/2020 10:20 AM

## 2020-03-27 NOTE — Progress Notes (Signed)
IV access removed. Patient belongings at bedside. Will leave condom catheter on due to patient incontinence, for transport to facility.  Awaiting EMS transport.

## 2020-03-27 NOTE — Discharge Summary (Signed)
Physician Discharge Summary  MAURY GRONINGER GDJ:[id number] DOB: 01/06/1941 DOA: 03/18/2020  PCP: Lind Covert, MD  Admit date: 03/18/2020 Discharge date: 03/27/2020  Admitted From: Home Disposition: Skilled nursing home  Recommendations for Outpatient Follow-up:  1. Follow up with PCP in 1-2 weeks 2. Please obtain BMP/CBC in one week 3. Please follow up on the following pending results: 4. PTH level and vitamin D levels    Discharge Condition: Stable CODE STATUS: Full code Diet recommendation: Low-carb diet, no calcium or vitamin D supplements  Discharge summary: 79 year old gentleman with history of coronary artery disease status post CABG, chronic A. fib on metoprolol and Coumadin, chronic bowel incontinence, aortic valve replacement with pericardial tissue, hypertension and dementia brought from home with increasing weakness worse over last few days. In the emergency room he had a temperature of 101, COVID-19 was negative.  Urinalysis was negative.  Patient was admitted with difficulty ambulation.  Treated for different medical condition as explained below.  #1 bilateral lower extremity weakness/multifactorial weakness: CT head, CT lumbar spine unremarkable MRI of the cervical spine and MRI of the lumbar spine showed multiple chronic degenerative disease and lumbar canal stenosis but no acute findings Discussed with neurosurgery, do not suggest or benefit with any surgical intervention Continue aggressive therapies/inpatient therapies at a skilled nursing facility to improve mobility.  #2 fever, an isolated event on admission.  Unclear etiology.  No evidence of infection.  #3 chronic A. fib: Heart rate occasionally elevated but acceptable.  On 75 mg of metoprolol at home.  His blood pressures are stable.  Will increase his metoprolol XL 100 mg daily.  He is therapeutic on Coumadin.  Continue Coumadin doses.  # 4.  Type 2 diabetes, complicated with diabetic  neuropathy: Diet controlled.  Monitor.  No indication for treatment.  Hemoglobin A1c 6.7.  #5. coronary artery disease status post CABG/aortic valve replacement/chronic diastolic heart failure: Patient is fairly stable.  Euvolemic.  On atorvastatin.  He is on as needed Lasix with potassium supplement.  #6. hypercalcemia: Has history of upper normal calcium levels since 2010. Calcium is mostly 9-10.4.  Could not find previous investigations. Discontinue multivitamins with calcium. Will send PTH and vitamin D levels.  Will call with results for any intervention.  #7. normocytic anemia.  Hemoglobin is stable.  We will add iron supplements.  Folic acid, vitamin Q22 were adequate.  Patient has multiple medical issues but currently stable.  Stable to transfer to skilled level of care.    Discharge Diagnoses:  Principal Problem:   Weakness Active Problems:   Essential hypertension   Type 2 diabetes mellitus with diabetic neuropathy, unspecified (HCC)   Atrial fibrillation (HCC)   Spinal stenosis of lumbar region   Fever   Lower extremity weakness   Generalized weakness    Discharge Instructions  Discharge Instructions    Diet Carb Modified   Complete by: As directed    Increase activity slowly   Complete by: As directed      Allergies as of 03/27/2020      Reactions   Pineapple Concentrate Nausea And Vomiting      Medication List    STOP taking these medications   multivitamin with minerals tablet     TAKE these medications   atorvastatin 40 MG tablet Commonly known as: LIPITOR Take 1 tablet (40 mg total) by mouth daily at 6 PM. TAKE 1 TABLET DAILY AT 6 PM What changed:   when to take this  additional instructions  budesonide-formoterol 80-4.5 MCG/ACT inhaler Commonly known as: Symbicort Inhale 2 puffs into the lungs 2 (two) times daily.   diphenoxylate-atropine 2.5-0.025 MG tablet Commonly known as: Lomotil Take 1 tablet by mouth 2 (two) times daily as  needed for diarrhea or loose stools.   donepezil 5 MG tablet Commonly known as: ARICEPT Take 1 tablet (5 mg total) by mouth at bedtime.   furosemide 40 MG tablet Commonly known as: LASIX PRN, take one tablet if 3lb weight gain in one day or 5lb or greater weight gain in one week.   glucose blood test strip USE TO CHECK BLOOD SUGARS FOUR TIMES DAILY   iron polysaccharides 150 MG capsule Commonly known as: NIFEREX Take 1 capsule (150 mg total) by mouth daily. Start taking on: March 28, 2020   metoprolol succinate 100 MG 24 hr tablet Commonly known as: TOPROL-XL Take 1 tablet (100 mg total) by mouth daily. What changed:   medication strength  how much to take   nitroGLYCERIN 0.4 MG SL tablet Commonly known as: NITROSTAT Place 1 tablet (0.4 mg total) under the tongue every 5 (five) minutes as needed. For chest pain   potassium chloride SA 20 MEQ tablet Commonly known as: KLOR-CON Taken by mouth only on days lasix is taken   warfarin 5 MG tablet Commonly known as: COUMADIN Take 1 tablet (5 mg total) by mouth daily at 4 PM.       Follow-up Information    Lind Covert, MD. Call.   Specialty: Family Medicine Why: Follow up within 1 week Contact information: [place] [address] [phone]              Allergies  Allergen Reactions  . Pineapple Concentrate Nausea And Vomiting    Consultations:  Neurosurgery, curbside   Procedures/Studies: DG Chest 2 View  Result Date: 03/18/2020 CLINICAL DATA:  Lower extremity weakness. EXAM: CHEST - 2 VIEW COMPARISON:  December 27, 2017 FINDINGS: Multiple sternal wires and vascular clips are present. A trace amount of atelectasis is seen along the lateral aspect of the right lung base. Very mild blunting of the left costophrenic angle is seen. No pneumothorax is identified. The cardiac silhouette is mildly enlarged. An artificial cardiac valve is seen. The visualized skeletal structures  are unremarkable. IMPRESSION: 1. Evidence of prior median sternotomy/CABG. 2. Very mild right basilar atelectasis with a very small left pleural effusion. Electronically Signed   By: Virgina Norfolk M.D.   On: 03/18/2020 19:55   DG Lumbar Spine Complete  Result Date: 03/20/2020 CLINICAL DATA:  Weakness. EXAM: LUMBAR SPINE - COMPLETE 4+ VIEW COMPARISON:  None. FINDINGS: No fracture or spondylolisthesis is noted. Large osteophyte formation is noted at L3-4 and L4-5 no significant disc space narrowing is noted. IMPRESSION: Degenerative changes as described above. No acute abnormality seen in the lumbar spine. Aortic Atherosclerosis (ICD10-I70.0). Electronically Signed   By: Marijo Conception M.D.   On: 03/20/2020 14:57   CT HEAD WO CONTRAST  Result Date: 03/19/2020 CLINICAL DATA:  79 year old male with neurologic deficit, weakness in both legs. EXAM: CT HEAD WITHOUT CONTRAST TECHNIQUE: Contiguous axial images were obtained from the base of the skull through the vertex without intravenous contrast. COMPARISON:  Brain MRI 03/12/2017.  Head CT 01/02/2020. FINDINGS: Brain: Stable cerebral volume. No midline shift, ventriculomegaly, mass effect, evidence of mass lesion, intracranial hemorrhage or evidence of cortically based acute infarction. Patchy and confluent bilateral white matter hypodensity is stable. No cortical encephalomalacia. Vascular: Calcified atherosclerosis at  the skull base. No suspicious intracranial vascular hyperdensity. Skull: No acute osseous abnormality identified. Sinuses/Orbits: Visualized paranasal sinuses and mastoids are stable and well pneumatized. Other: Visualized orbits and scalp soft tissues are within normal limits. IMPRESSION: No acute intracranial abnormality. Stable chronic white matter changes most commonly due to small vessel disease. Electronically Signed   By: Genevie Ann M.D.   On: 03/19/2020 02:29   CT LUMBAR SPINE WO CONTRAST  Result Date: 03/19/2020 CLINICAL DATA:   79 year old male with low back pain, weakness in both legs. EXAM: CT LUMBAR SPINE WITHOUT CONTRAST TECHNIQUE: Multidetector CT imaging of the lumbar spine was performed without intravenous contrast administration. Multiplanar CT image reconstructions were also generated. COMPARISON:  CT Abdomen and Pelvis 12/27/2017. Lumbar MRI 09/19/2015. FINDINGS: Segmentation: Judging from hypoplastic ribs designated the T12 level, the L5 level is mostly sacralized. This differs from the numbering system used on the 2017 MRI (where S1 was designated as the transitional segment). Correlation with radiographs is recommended prior to any operative intervention. Alignment: Stable lumbar lordosis since 2017. No spondylolisthesis. Subtle levoconvex lumbar scoliosis. Vertebrae: Chronic L2 inferior endplate Schmorl's node is stable since 2017. No acute osseous abnormality identified. There is chronic SI joint ankylosis. Paraspinal and other soft tissues: Negative visible lung bases. Aortoiliac calcified atherosclerosis. Negative visible other abdominal viscera. Negative lumbar paraspinal soft tissues. Disc levels: Lower spinal degeneration does not appear significantly progressed since 2017, and is notable for: T10-T11: Vacuum disc and small vacuum containing Schmorl's node. L2-L3: Bulky far lateral disc osteophyte complex. Chronic Schmorl's node. Moderate facet hypertrophy. Mild to moderate bilateral L2 foraminal stenosis. L3-L4: Circumferential disc bulge with bulky endplate and facet spurring. Chronic epidural lipomatosis. Moderate spinal stenosis and mild to moderate left foraminal stenosis. L4-L5: Severe chronic facet arthropathy on the right. Chronic vacuum facet on that side. Superimposed circumferential disc bulge and endplate spurring eccentric to the right. Chronic epidural lipomatosis. Moderate spinal stenosis. Mild if any foraminal stenosis. L5-S1: Mostly sacralized, negative. IMPRESSION: 1. No acute osseous abnormality in  the lumbar spine. 2. Transitional anatomy with a mostly sacralized L5 level suspected. This differs from the numbering system used on a 2017 MRI. 3. Advanced lower spinal degeneration appears stable since 2017, with multifactorial moderate spinal stenosis at L3-L4 and L4-L5. 4. Chronic SI joint ankylosis. 5. Aortic Atherosclerosis (ICD10-I70.0). Electronically Signed   By: Genevie Ann M.D.   On: 03/19/2020 02:25   MR CERVICAL SPINE WO CONTRAST  Result Date: 03/19/2020 CLINICAL DATA:  Weakness and lower extremities. Cervical radiculopathy. Febrile illness. EXAM: MRI CERVICAL SPINE WITHOUT CONTRAST TECHNIQUE: Multiplanar, multisequence MR imaging of the cervical spine was performed. No intravenous contrast was administered. COMPARISON:  CT cervical spine 01/10/2019 FINDINGS: Examination is quite limited due to patient motion. Alignment: Overall normal alignment. Vertebrae: Advanced degenerative cervical spondylosis with multilevel disc disease and facet disease. No acute bony findings or worrisome bone lesion. No destructive bony changes. I do not see any findings suspicious for discitis or osteomyelitis. Cord: Cord narrowing/thinning at C4-5 without definite cord signal abnormality or cord lesion. However, exam is limited by patient motion. Posterior Fossa, vertebral arteries, paraspinal tissues: No significant findings. Disc levels: C2-3: No significant findings. C3-4: Bulging degenerated annulus, osteophytic ridging and uncinate spurring with flattening of the ventral thecal sac and mild right and moderate left foraminal stenosis. C4-5: Bulging degenerated annulus, moderate-sized central disc protrusion and osteophytic ridging contributing to significant spinal and moderate right foraminal stenosis. Mild left foraminal stenosis also. C5-6: Advanced degenerative disc disease and facet  disease. Bulging degenerated annulus, osteophytic ridging and uncinate spurring with flattening of the ventral thecal sac and  narrowing the ventral CSF space. Mild foraminal stenosis bilaterally. C6-7: Bulging degenerated annulus, osteophytic ridging and uncinate spurring. Flattening of the ventral thecal sac and narrowing the ventral CSF space. No foraminal stenosis. C7-T1: Advanced facet disease but no disc protrusions, spinal or foraminal stenosis. IMPRESSION: 1. Advanced degenerative cervical spondylosis with multilevel disc disease and facet disease. 2. No findings for discitis or osteomyelitis. 3. Significant spinal and moderate right foraminal stenosis at C4-5. 4. Mild spinal and bilateral foraminal stenosis at C5-6. 5. Mild right and moderate left foraminal stenosis at C3-4. Electronically Signed   By: Marijo Sanes M.D.   On: 03/19/2020 12:16   MR LUMBAR SPINE WO CONTRAST  Result Date: 03/20/2020 CLINICAL DATA:  Acute or progressive myelopathy EXAM: MRI LUMBAR SPINE WITHOUT CONTRAST TECHNIQUE: Multiplanar, multisequence MR imaging of the lumbar spine was performed. No intravenous contrast was administered. COMPARISON:  09/19/2015 FINDINGS: Segmentation: Transitional lumbosacral vertebra with rudimentary S1-2 disc space. This numbering scheme matches the comparison report describing the disc levels Alignment:  Physiologic Vertebrae: Chronic Schmorl's node in the L3 inferior endplate. No acute fracture, discitis, or aggressive bone lesion Conus medullaris and cauda equina: Conus extends to the L1 level. Conus and cauda equina are unremarkable, although there is significant limitation by patient motion. Paraspinal and other soft tissues: Negative Disc levels: The spinal canal is congenitally narrow from short pedicles. L1-L2: Unremarkable. L2-L3: Mild facet spurring. Right far-lateral annulus bulging and endplate spurring which could contact the extraforaminal L2 nerve root L3-L4: Mild annulus bulging and facet spurring. L4-L5: Mild annulus bulging. Facet osteoarthritis with spurring and joint distortion. Degenerative and  congenital factors with dorsal epidural fat expansion cause moderate thecal sac compression. L5-S1:Unremarkable.Facet osteoarthritis with bulky asymmetric right-sided spurring. Mild disc narrowing and bulging. IMPRESSION: 1. Motion degraded study.  No visible conus abnormality. 2. Congenitally narrow spinal canal with superimposed degenerative disease causing thecal sac compression at L4-5. 3. No foraminal impingement. Electronically Signed   By: Monte Fantasia M.D.   On: 03/20/2020 09:03   US Abdomen Limited RUQ (LIVER/GB)  Result Date: 03/22/2020 CLINICAL DATA:  Abnormal liver function tests. EXAM: ULTRASOUND ABDOMEN LIMITED RIGHT UPPER QUADRANT COMPARISON:  December 27, 2017.  July 05, 2014. FINDINGS: Gallbladder: No gallstones or wall thickening visualized. No sonographic Murphy sign noted by sonographer. Common bile duct: Diameter: 6 mm which is within normal limits. Liver: No focal lesion identified. Mildly increased echogenicity of hepatic parenchyma is noted suggesting hepatic cirrhosis. Portal vein is patent on color Doppler imaging with normal direction of blood flow towards the liver. Other: None. IMPRESSION: Mildly increased echogenicity of hepatic parenchyma is noted suggesting hepatic cirrhosis. No other abnormality seen in the right upper quadrant of the abdomen. Electronically Signed   By: Marijo Conception M.D.   On: 03/22/2020 15:52   (Echo, Carotid, EGD, Colonoscopy, ERCP)    Subjective: Patient seen and examined.  No overnight events.  He is mostly alert and oriented, however has some cognitive problems.  He states that the place he lives in is not good so he needs to go to a new place. He thinks he is weak "and people take advantage of it, they hurt me by using my weak legs"   Discharge Exam: Vitals:   03/27/20 0822 03/27/20 0919  BP:  107/60  Pulse:  (!) 119  Resp:    Temp:    SpO2: 95%  Vitals:   03/27/20 0434 03/27/20 0500 03/27/20 0822 03/27/20 0919  BP: 111/79    107/60  Pulse: 64   (!) 119  Resp: 18     Temp: 98.3 F (36.8 C)     TempSrc: Oral     SpO2: 93%  95%   Weight: 88.2 kg 87.6 kg    Height:        General: Pt is alert, awake, not in acute distress, comfortably sleeping on room air. Cardiovascular: Irregularly irregular, tachycardic, S1/S2 +, no rubs, no gallops Respiratory: CTA bilaterally, no wheezing, no rhonchi Abdominal: Soft, NT, ND, bowel sounds + Extremities: no edema, no cyanosis Patient has generalized weakness of both lower extremity, right is weaker than left.  Sensation is normal.  Reflexes are normal.    The results of significant diagnostics from this hospitalization (including imaging, microbiology, ancillary and laboratory) are listed below for reference.     Microbiology: Recent Results (from the past 240 hour(s))  Culture, blood (Routine x 2)     Status: None   Collection Time: 03/18/20  7:45 PM   Specimen: BLOOD  Result Value Ref Range Status   Specimen Description   Final    BLOOD RIGHT ANTECUBITAL Performed at [facility] [address] [place], [place] [address]    Special Requests   Final    BOTTLES DRAWN AEROBIC AND ANAEROBIC Blood Culture results may not be optimal due to an excessive volume of blood received in culture bottles Performed at [facility] [address], [place], [place] [address]    Culture   Final    NO GROWTH 5 DAYS Performed at [facility], [place] [address], [place], [place] [address]    Report Status 03/23/2020 FINAL  Final  Culture, blood (Routine x 2)     Status: None   Collection Time: 03/18/20  8:00 PM   Specimen: BLOOD RIGHT FOREARM  Result Value Ref Range Status   Specimen Description   Final    BLOOD RIGHT FOREARM Performed at [facility], [place] [address], [place], [place] [address]    Special Requests   Final    BOTTLES DRAWN AEROBIC AND ANAEROBIC Blood Culture adequate volume Performed at [facility] [address], [place], [place] [address]    Culture   Final    NO GROWTH 5 DAYS Performed at [facility], [place] [address], [place], [place] [address]    Report Status 03/23/2020 FINAL  Final  Respiratory Panel by RT PCR (Flu A&B, Covid) - Nasopharyngeal Swab     Status: None   Collection Time: 03/18/20  8:22 PM   Specimen: Nasopharyngeal Swab  Result Value Ref Range Status   SARS Coronavirus 2 by RT PCR NEGATIVE NEGATIVE Final    Comment: (NOTE) SARS-CoV-2 target nucleic acids are NOT DETECTED.  The SARS-CoV-2 RNA is generally detectable in upper respiratoy specimens during the acute phase of infection. The lowest concentration of SARS-CoV-2 viral copies this assay can detect is 131 copies/mL. A negative result does not preclude SARS-Cov-2 infection and should not be used as the sole basis for treatment or other patient management decisions. A negative result may occur with  improper specimen collection/handling, submission of specimen other than nasopharyngeal swab, presence of viral mutation(s) within the areas targeted by this assay, and inadequate number of viral copies (<131 copies/mL). A negative result must be combined with clinical observations, patient history, and epidemiological information. The  expected result is Negative.  Fact Sheet for Patients:  PinkCheek.be  Fact Sheet for Healthcare Providers:  GravelBags.it  This test is no t yet approved or cleared by the [place] FDA and  has been authorized for detection and/or diagnosis of SARS-CoV-2 by FDA under an Emergency Use Authorization (EUA). This EUA will remain  in effect (meaning this test can be used) for the duration of the COVID-19 declaration under Section 564(b)(1) of the Act, 21 U.S.C. section 360bbb-3(b)(1), unless the authorization is terminated or revoked sooner.     Influenza A by PCR NEGATIVE NEGATIVE Final    Influenza B by PCR NEGATIVE NEGATIVE Final    Comment: (NOTE) The Xpert Xpress SARS-CoV-2/FLU/RSV assay is intended as an aid in  the diagnosis of influenza from Nasopharyngeal swab specimens and  should not be used as a sole basis for treatment. Nasal washings and  aspirates are unacceptable for Xpert Xpress SARS-CoV-2/FLU/RSV  testing.  Fact Sheet for Patients: PinkCheek.be  Fact Sheet for Healthcare Providers: GravelBags.it  This test is not yet approved or cleared by the [place] FDA and  has been authorized for detection and/or diagnosis of SARS-CoV-2 by  FDA under an Emergency Use Authorization (EUA). This EUA will remain  in effect (meaning this test can be used) for the duration of the  Covid-19 declaration under Section 564(b)(1) of the Act, 21  U.S.C. section 360bbb-3(b)(1), unless the authorization is  terminated or revoked. Performed at [facility], [place] [address] [place], [place] [address]   Respiratory Panel by RT PCR (Flu A&B, Covid) - Nasopharyngeal Swab     Status: None   Collection Time: 03/26/20 11:00 PM   Specimen: Nasopharyngeal Swab  Result Value Ref Range Status   SARS Coronavirus 2 by RT PCR NEGATIVE NEGATIVE Final    Comment: (NOTE) SARS-CoV-2 target nucleic acids are NOT DETECTED.  The SARS-CoV-2 RNA is generally detectable in upper respiratoy specimens during the acute phase of infection. The lowest concentration of SARS-CoV-2 viral copies this assay can detect is 131 copies/mL. A negative result does not preclude SARS-Cov-2 infection and should not be used as the sole basis for treatment or other patient management decisions. A negative result may occur with  improper specimen collection/handling, submission of specimen other than nasopharyngeal swab, presence of viral mutation(s) within the areas targeted by this assay, and inadequate number of viral copies (<131  copies/mL). A negative result must be combined with clinical observations, patient history, and epidemiological information. The expected result is Negative.  Fact Sheet for Patients:  PinkCheek.be  Fact Sheet for Healthcare Providers:  GravelBags.it  This test is no t yet approved or cleared by the [place] FDA and  has been authorized for detection and/or diagnosis of SARS-CoV-2 by FDA under an Emergency Use Authorization (EUA). This EUA will remain  in effect (meaning this test can be used) for the duration of the COVID-19 declaration under Section 564(b)(1) of the Act, 21 U.S.C. section 360bbb-3(b)(1), unless the authorization is terminated or revoked sooner.     Influenza A by PCR NEGATIVE NEGATIVE Final   Influenza B by PCR NEGATIVE NEGATIVE Final    Comment: (NOTE) The Xpert Xpress SARS-CoV-2/FLU/RSV assay is intended as an aid in  the diagnosis of influenza from Nasopharyngeal swab specimens and  should not be used as a sole basis for treatment. Nasal washings and  aspirates are unacceptable for Xpert Xpress SARS-CoV-2/FLU/RSV  testing.  Fact Sheet for Patients: PinkCheek.be  Fact Sheet for  Healthcare Providers: GravelBags.it  This test is not yet approved or cleared by the [place] and  has been authorized for detection and/or diagnosis of SARS-CoV-2 by  FDA under an Emergency Use Authorization (EUA). This EUA will remain  in effect (meaning this test can be used) for the duration of the  Covid-19 declaration under Section 564(b)(1) of the Act, 21  U.S.C. section 360bbb-3(b)(1), unless the authorization is  terminated or revoked. Performed at [facility], [place] [address], [place], [place] [address]      Labs: BNP (last 3 results) No results for input(s): BNP in the last 8760 hours. Basic Metabolic Panel: Recent  Labs  Lab 03/23/20 0418 03/24/20 0425 03/25/20 0444 03/26/20 0420 03/27/20 0401  NA 134* 133* 136 137 136  K 3.7 3.5 3.9 4.3 4.1  CL 98 96* 98 99 99  CO2 26 29 30 29 27   GLUCOSE 147* 153* 164* 157* 149*  BUN 12 12 14 15 12   CREATININE 0.75 0.70 0.79 0.75 0.64  CALCIUM 9.8 9.8 10.0 10.4* 10.4*  MG 2.5* 2.4 2.3 2.3 2.2  PHOS 2.7 2.4* 2.9 2.8 2.7   Liver Function Tests: Recent Labs  Lab 03/23/20 0418 03/24/20 0425 03/25/20 0444 03/26/20 0420 03/27/20 0401  AST 81* 74* 64* 43* 26  ALT 51* 60* 61* 52* 39  ALKPHOS 69 71 81 79 66  BILITOT 1.0 0.8 0.7 0.8 0.7  PROT 6.3* 6.7 6.4* 6.7 6.2*  ALBUMIN 2.7* 2.8* 2.6* 2.8* 2.7*   No results for input(s): LIPASE, AMYLASE in the last 168 hours. No results for input(s): AMMONIA in the last 168 hours. CBC: Recent Labs  Lab 03/23/20 0418 03/24/20 0425 03/25/20 0444 03/26/20 0420 03/27/20 0401  WBC 6.3 5.8 6.0 6.8 6.9  NEUTROABS 4.6 4.0 4.2 5.0 4.9  HGB 10.6* 10.3* 10.4* 10.7* 10.1*  HCT 34.1* 32.8* 33.0* 34.6* 32.8*  MCV 87.9 87.5 87.5 88.5 89.1  PLT 196 232 265 294 300   Cardiac Enzymes: No results for input(s): CKTOTAL, CKMB, CKMBINDEX, TROPONINI in the last 168 hours. BNP: Invalid input(s): POCBNP CBG: Recent Labs  Lab 03/26/20 0739 03/26/20 1206 03/26/20 1649 03/26/20 2135 03/27/20 0742  GLUCAP 159* 179* 154* 147* 142*   D-Dimer No results for input(s): DDIMER in the last 72 hours. Hgb A1c No results for input(s): HGBA1C in the last 72 hours. Lipid Profile No results for input(s): CHOL, HDL, LDLCALC, TRIG, CHOLHDL, LDLDIRECT in the last 72 hours. Thyroid function studies No results for input(s): TSH, T4TOTAL, T3FREE, THYROIDAB in the last 72 hours.  Invalid input(s): FREET3 Anemia work up No results for input(s): VITAMINB12, FOLATE, FERRITIN, TIBC, IRON, RETICCTPCT in the last 72 hours. Urinalysis    Component Value Date/Time   COLORURINE YELLOW 03/19/2020 0811   APPEARANCEUR CLEAR 03/19/2020 0811    LABSPEC 1.020 03/19/2020 0811   PHURINE 5.0 03/19/2020 0811   GLUCOSEU NEGATIVE 03/19/2020 0811   HGBUR NEGATIVE 03/19/2020 0811   BILIRUBINUR NEGATIVE 03/19/2020 0811   BILIRUBINUR NEG 02/01/2015 0950   KETONESUR NEGATIVE 03/19/2020 0811   PROTEINUR NEGATIVE 03/19/2020 0811   UROBILINOGEN 0.2 02/01/2015 0950   UROBILINOGEN 1.0 07/24/2010 1411   NITRITE NEGATIVE 03/19/2020 0811   LEUKOCYTESUR NEGATIVE 03/19/2020 0811   Sepsis Labs Invalid input(s): PROCALCITONIN,  WBC,  LACTICIDVEN Microbiology Recent Results (from the past 240 hour(s))  Culture, blood (Routine x 2)     Status: None   Collection Time: 03/18/20  7:45 PM   Specimen: BLOOD  Result Value  Ref Range Status   Specimen Description   Final    BLOOD RIGHT ANTECUBITAL Performed at [facility] [address] [place], [place] [address]    Special Requests   Final    BOTTLES DRAWN AEROBIC AND ANAEROBIC Blood Culture results may not be optimal due to an excessive volume of blood received in culture bottles Performed at [facility] [address] [place], [place] [address]    Culture   Final    NO GROWTH 5 DAYS Performed at [facility], [place] [address] [place], [place] [address]    Report Status 03/23/2020 FINAL  Final  Culture, blood (Routine x 2)     Status: None   Collection Time: 03/18/20  8:00 PM   Specimen: BLOOD RIGHT FOREARM  Result Value Ref Range Status   Specimen Description   Final    BLOOD RIGHT FOREARM Performed at [facility], [place] [address] [place], [place] [address]    Special Requests   Final    BOTTLES DRAWN AEROBIC AND ANAEROBIC Blood Culture adequate volume Performed at [facility] [address] [place], [place] [address]    Culture   Final    NO GROWTH 5 DAYS Performed at [facility], [place] [address] [place], [place] [address]    Report Status 03/23/2020 FINAL  Final  Respiratory Panel by RT PCR (Flu A&B,  Covid) - Nasopharyngeal Swab     Status: None   Collection Time: 03/18/20  8:22 PM   Specimen: Nasopharyngeal Swab  Result Value Ref Range Status   SARS Coronavirus 2 by RT PCR NEGATIVE NEGATIVE Final    Comment: (NOTE) SARS-CoV-2 target nucleic acids are NOT DETECTED.  The SARS-CoV-2 RNA is generally detectable in upper respiratoy specimens during the acute phase of infection. The lowest concentration of SARS-CoV-2 viral copies this assay can detect is 131 copies/mL. A negative result does not preclude SARS-Cov-2 infection and should not be used as the sole basis for treatment or other patient management decisions. A negative result may occur with  improper specimen collection/handling, submission of specimen other than nasopharyngeal swab, presence of viral mutation(s) within the areas targeted by this assay, and inadequate number of viral copies (<131 copies/mL). A negative result must be combined with clinical observations, patient history, and epidemiological information. The expected result is Negative.  Fact Sheet for Patients:  PinkCheek.be  Fact Sheet for Healthcare Providers:  GravelBags.it  This test is no t yet approved or cleared by the [place] FDA and  has been authorized for detection and/or diagnosis of SARS-CoV-2 by FDA under an Emergency Use Authorization (EUA). This EUA will remain  in effect (meaning this test can be used) for the duration of the COVID-19 declaration under Section 564(b)(1) of the Act, 21 U.S.C. section 360bbb-3(b)(1), unless the authorization is terminated or revoked sooner.     Influenza A by PCR NEGATIVE NEGATIVE Final   Influenza B by PCR NEGATIVE NEGATIVE Final    Comment: (NOTE) The Xpert Xpress SARS-CoV-2/FLU/RSV assay is intended as an aid in  the diagnosis of influenza from Nasopharyngeal swab specimens and  should not be used as a sole basis for treatment. Nasal  washings and  aspirates are unacceptable for Xpert Xpress SARS-CoV-2/FLU/RSV  testing.  Fact Sheet for Patients: PinkCheek.be  Fact Sheet for Healthcare Providers: GravelBags.it  This test is not yet approved or cleared by the [place] and  has been authorized  for detection and/or diagnosis of SARS-CoV-2 by  FDA under an Emergency Use Authorization (EUA). This EUA will remain  in effect (meaning this test can be used) for the duration of the  Covid-19 declaration under Section 564(b)(1) of the Act, 21  U.S.C. section 360bbb-3(b)(1), unless the authorization is  terminated or revoked. Performed at [facility], [place] [address], [place], [place] [address]   Respiratory Panel by RT PCR (Flu A&B, Covid) - Nasopharyngeal Swab     Status: None   Collection Time: 03/26/20 11:00 PM   Specimen: Nasopharyngeal Swab  Result Value Ref Range Status   SARS Coronavirus 2 by RT PCR NEGATIVE NEGATIVE Final    Comment: (NOTE) SARS-CoV-2 target nucleic acids are NOT DETECTED.  The SARS-CoV-2 RNA is generally detectable in upper respiratoy specimens during the acute phase of infection. The lowest concentration of SARS-CoV-2 viral copies this assay can detect is 131 copies/mL. A negative result does not preclude SARS-Cov-2 infection and should not be used as the sole basis for treatment or other patient management decisions. A negative result may occur with  improper specimen collection/handling, submission of specimen other than nasopharyngeal swab, presence of viral mutation(s) within the areas targeted by this assay, and inadequate number of viral copies (<131 copies/mL). A negative result must be combined with clinical observations, patient history, and epidemiological information. The expected result is Negative.  Fact Sheet for Patients:  PinkCheek.be  Fact Sheet for  Healthcare Providers:  GravelBags.it  This test is no t yet approved or cleared by the [place] FDA and  has been authorized for detection and/or diagnosis of SARS-CoV-2 by FDA under an Emergency Use Authorization (EUA). This EUA will remain  in effect (meaning this test can be used) for the duration of the COVID-19 declaration under Section 564(b)(1) of the Act, 21 U.S.C. section 360bbb-3(b)(1), unless the authorization is terminated or revoked sooner.     Influenza A by PCR NEGATIVE NEGATIVE Final   Influenza B by PCR NEGATIVE NEGATIVE Final    Comment: (NOTE) The Xpert Xpress SARS-CoV-2/FLU/RSV assay is intended as an aid in  the diagnosis of influenza from Nasopharyngeal swab specimens and  should not be used as a sole basis for treatment. Nasal washings and  aspirates are unacceptable for Xpert Xpress SARS-CoV-2/FLU/RSV  testing.  Fact Sheet for Patients: PinkCheek.be  Fact Sheet for Healthcare Providers: GravelBags.it  This test is not yet approved or cleared by the [place] FDA and  has been authorized for detection and/or diagnosis of SARS-CoV-2 by  FDA under an Emergency Use Authorization (EUA). This EUA will remain  in effect (meaning this test can be used) for the duration of the  Covid-19 declaration under Section 564(b)(1) of the Act, 21  U.S.C. section 360bbb-3(b)(1), unless the authorization is  terminated or revoked. Performed at [facility], [place] [address], [place],  [address]      Time coordinating discharge:  40 minutes  SIGNED:   Barb Merino, MD  [facility] 03/27/2020, 10:27 AM

## 2020-03-27 NOTE — TOC Transition Note (Addendum)
Transition of Care [facility]) - CM/SW Discharge Note   Patient Details  Name: Matthew Edwards MRN: [id number] Date of Birth: 21-Mar-1941  [facility] CM/SW Contact:  Ross Ludwig, LCSW Phone Number: 03/27/2020, 10:58 AM   Clinical Narrative:     Patient to be d/c'ed today to [facility].  Patient and family agreeable to plans will transport via ems RN to call report to [phone].  Patient's daughter [name] was notified of plan for discharge for today.  Final next level of care: Skilled Nursing Facility Barriers to Discharge: Barriers Resolved   Patient Goals and CMS Choice Patient states their goals for this hospitalization and ongoing recovery are:: To go to SNF for rehab, then return back home. CMS Medicare.gov Compare Post Acute Care list provided to:: Patient Represenative (must comment) Choice offered to / list presented to : Adult Children  Discharge Placement   Existing PASRR number confirmed : 03/22/20          Patient chooses bed at: [facility] Patient to be transferred to facility by: [facility] Name of family member notified: Imagene Sheller, patient's daughter Patient and family notified of of transfer: 03/27/20  Discharge Plan and Services                  DME Agency: NA       HH Arranged: NA          Social Determinants of Health (SDOH) Interventions     Readmission Risk Interventions No flowsheet data found.

## 2020-03-27 NOTE — Progress Notes (Addendum)
Report called to Caryl Pina, LPN at [facility].   All questions answered.  AVS placed in discharged packet to go with patient to facility.

## 2020-03-28 DIAGNOSIS — E119 Type 2 diabetes mellitus without complications: Secondary | ICD-10-CM | POA: Diagnosis not present

## 2020-03-28 DIAGNOSIS — Z79899 Other long term (current) drug therapy: Secondary | ICD-10-CM | POA: Diagnosis not present

## 2020-03-28 DIAGNOSIS — Z7901 Long term (current) use of anticoagulants: Secondary | ICD-10-CM | POA: Diagnosis not present

## 2020-03-28 LAB — PTH, INTACT AND CALCIUM
Calcium, Total (PTH): 10 mg/dL (ref 8.6–10.2)
PTH: 16 pg/mL (ref 15–65)

## 2020-03-28 LAB — PARATHYROID HORMONE, INTACT (NO CA): PTH: 18 pg/mL (ref 15–65)

## 2020-03-28 LAB — CALCIUM, IONIZED: Calcium, Ionized, Serum: 6.3 mg/dL — ABNORMAL HIGH (ref 4.5–5.6)

## 2020-04-02 DIAGNOSIS — I10 Essential (primary) hypertension: Secondary | ICD-10-CM | POA: Diagnosis not present

## 2020-04-03 ENCOUNTER — Ambulatory Visit: Payer: Medicare HMO | Admitting: Gastroenterology

## 2020-04-03 DIAGNOSIS — L988 Other specified disorders of the skin and subcutaneous tissue: Secondary | ICD-10-CM | POA: Diagnosis not present

## 2020-04-04 DIAGNOSIS — Z7901 Long term (current) use of anticoagulants: Secondary | ICD-10-CM | POA: Diagnosis not present

## 2020-04-10 DIAGNOSIS — L988 Other specified disorders of the skin and subcutaneous tissue: Secondary | ICD-10-CM | POA: Diagnosis not present

## 2020-04-17 DIAGNOSIS — L988 Other specified disorders of the skin and subcutaneous tissue: Secondary | ICD-10-CM | POA: Diagnosis not present

## 2020-04-27 ENCOUNTER — Other Ambulatory Visit: Payer: Self-pay | Admitting: Family Medicine

## 2020-04-30 DIAGNOSIS — M48062 Spinal stenosis, lumbar region with neurogenic claudication: Secondary | ICD-10-CM | POA: Diagnosis not present

## 2020-04-30 DIAGNOSIS — I5032 Chronic diastolic (congestive) heart failure: Secondary | ICD-10-CM | POA: Diagnosis not present

## 2020-04-30 DIAGNOSIS — F0280 Dementia in other diseases classified elsewhere without behavioral disturbance: Secondary | ICD-10-CM | POA: Diagnosis not present

## 2020-04-30 DIAGNOSIS — M4802 Spinal stenosis, cervical region: Secondary | ICD-10-CM | POA: Diagnosis not present

## 2020-04-30 DIAGNOSIS — E1140 Type 2 diabetes mellitus with diabetic neuropathy, unspecified: Secondary | ICD-10-CM | POA: Diagnosis not present

## 2020-04-30 DIAGNOSIS — I110 Hypertensive heart disease with heart failure: Secondary | ICD-10-CM | POA: Diagnosis not present

## 2020-04-30 DIAGNOSIS — I482 Chronic atrial fibrillation, unspecified: Secondary | ICD-10-CM | POA: Diagnosis not present

## 2020-04-30 DIAGNOSIS — E559 Vitamin D deficiency, unspecified: Secondary | ICD-10-CM | POA: Diagnosis not present

## 2020-05-02 DIAGNOSIS — I110 Hypertensive heart disease with heart failure: Secondary | ICD-10-CM | POA: Diagnosis not present

## 2020-05-02 DIAGNOSIS — E559 Vitamin D deficiency, unspecified: Secondary | ICD-10-CM | POA: Diagnosis not present

## 2020-05-02 DIAGNOSIS — I5032 Chronic diastolic (congestive) heart failure: Secondary | ICD-10-CM | POA: Diagnosis not present

## 2020-05-02 DIAGNOSIS — M4802 Spinal stenosis, cervical region: Secondary | ICD-10-CM | POA: Diagnosis not present

## 2020-05-02 DIAGNOSIS — F0280 Dementia in other diseases classified elsewhere without behavioral disturbance: Secondary | ICD-10-CM | POA: Diagnosis not present

## 2020-05-02 DIAGNOSIS — E1140 Type 2 diabetes mellitus with diabetic neuropathy, unspecified: Secondary | ICD-10-CM | POA: Diagnosis not present

## 2020-05-02 DIAGNOSIS — I482 Chronic atrial fibrillation, unspecified: Secondary | ICD-10-CM | POA: Diagnosis not present

## 2020-05-02 DIAGNOSIS — M48062 Spinal stenosis, lumbar region with neurogenic claudication: Secondary | ICD-10-CM | POA: Diagnosis not present

## 2020-05-03 ENCOUNTER — Telehealth: Payer: Self-pay | Admitting: *Deleted

## 2020-05-03 NOTE — Telephone Encounter (Signed)
Crystal from [facility] for RN verbal orders as follows:  1 time(s) weekly for 8-9 week(s)  Verbal orders given per [facility] protocol  Also, she wants [facility] to check his PT/INR since they are going out anyway.  Spoke with [name] and orders given.  Pt has appt with PCP on 05/07/20 ( Crystal will remind him of appt), so we may want to check INR/PT if she has not went out before then.  To PCP.  Christen Bame, CMA

## 2020-05-06 DIAGNOSIS — M48062 Spinal stenosis, lumbar region with neurogenic claudication: Secondary | ICD-10-CM | POA: Diagnosis not present

## 2020-05-06 DIAGNOSIS — I5032 Chronic diastolic (congestive) heart failure: Secondary | ICD-10-CM | POA: Diagnosis not present

## 2020-05-06 DIAGNOSIS — E1140 Type 2 diabetes mellitus with diabetic neuropathy, unspecified: Secondary | ICD-10-CM | POA: Diagnosis not present

## 2020-05-06 DIAGNOSIS — F0280 Dementia in other diseases classified elsewhere without behavioral disturbance: Secondary | ICD-10-CM | POA: Diagnosis not present

## 2020-05-06 DIAGNOSIS — M4802 Spinal stenosis, cervical region: Secondary | ICD-10-CM | POA: Diagnosis not present

## 2020-05-06 DIAGNOSIS — I110 Hypertensive heart disease with heart failure: Secondary | ICD-10-CM | POA: Diagnosis not present

## 2020-05-06 DIAGNOSIS — E559 Vitamin D deficiency, unspecified: Secondary | ICD-10-CM | POA: Diagnosis not present

## 2020-05-06 DIAGNOSIS — I482 Chronic atrial fibrillation, unspecified: Secondary | ICD-10-CM | POA: Diagnosis not present

## 2020-05-07 ENCOUNTER — Ambulatory Visit: Payer: Medicare HMO | Admitting: Family Medicine

## 2020-05-08 ENCOUNTER — Telehealth: Payer: Self-pay

## 2020-05-08 DIAGNOSIS — M4802 Spinal stenosis, cervical region: Secondary | ICD-10-CM | POA: Diagnosis not present

## 2020-05-08 DIAGNOSIS — E559 Vitamin D deficiency, unspecified: Secondary | ICD-10-CM | POA: Diagnosis not present

## 2020-05-08 DIAGNOSIS — I5032 Chronic diastolic (congestive) heart failure: Secondary | ICD-10-CM | POA: Diagnosis not present

## 2020-05-08 DIAGNOSIS — E1140 Type 2 diabetes mellitus with diabetic neuropathy, unspecified: Secondary | ICD-10-CM | POA: Diagnosis not present

## 2020-05-08 DIAGNOSIS — M48062 Spinal stenosis, lumbar region with neurogenic claudication: Secondary | ICD-10-CM | POA: Diagnosis not present

## 2020-05-08 DIAGNOSIS — I110 Hypertensive heart disease with heart failure: Secondary | ICD-10-CM | POA: Diagnosis not present

## 2020-05-08 DIAGNOSIS — I482 Chronic atrial fibrillation, unspecified: Secondary | ICD-10-CM | POA: Diagnosis not present

## 2020-05-08 DIAGNOSIS — F0280 Dementia in other diseases classified elsewhere without behavioral disturbance: Secondary | ICD-10-CM | POA: Diagnosis not present

## 2020-05-08 NOTE — Telephone Encounter (Signed)
Matthew Edwards with [facility] calls nurse line to give PT/INR results.   INR: 2.1 PT: 25.0  Matthew Edwards [phone].

## 2020-05-14 DIAGNOSIS — E1140 Type 2 diabetes mellitus with diabetic neuropathy, unspecified: Secondary | ICD-10-CM | POA: Diagnosis not present

## 2020-05-14 DIAGNOSIS — M48062 Spinal stenosis, lumbar region with neurogenic claudication: Secondary | ICD-10-CM | POA: Diagnosis not present

## 2020-05-14 DIAGNOSIS — E559 Vitamin D deficiency, unspecified: Secondary | ICD-10-CM | POA: Diagnosis not present

## 2020-05-14 DIAGNOSIS — I5032 Chronic diastolic (congestive) heart failure: Secondary | ICD-10-CM | POA: Diagnosis not present

## 2020-05-14 DIAGNOSIS — F0280 Dementia in other diseases classified elsewhere without behavioral disturbance: Secondary | ICD-10-CM | POA: Diagnosis not present

## 2020-05-14 DIAGNOSIS — I110 Hypertensive heart disease with heart failure: Secondary | ICD-10-CM | POA: Diagnosis not present

## 2020-05-14 DIAGNOSIS — I482 Chronic atrial fibrillation, unspecified: Secondary | ICD-10-CM | POA: Diagnosis not present

## 2020-05-14 DIAGNOSIS — M4802 Spinal stenosis, cervical region: Secondary | ICD-10-CM | POA: Diagnosis not present

## 2020-05-16 ENCOUNTER — Telehealth: Payer: Self-pay

## 2020-05-16 NOTE — Telephone Encounter (Signed)
Colletta Maryland from [facility] calling for verbal orders for medical social worker.   Verbal orders given per [facility] protocol  Talbot Grumbling, RN

## 2020-05-22 DIAGNOSIS — I110 Hypertensive heart disease with heart failure: Secondary | ICD-10-CM | POA: Diagnosis not present

## 2020-05-22 DIAGNOSIS — E559 Vitamin D deficiency, unspecified: Secondary | ICD-10-CM | POA: Diagnosis not present

## 2020-05-22 DIAGNOSIS — I482 Chronic atrial fibrillation, unspecified: Secondary | ICD-10-CM | POA: Diagnosis not present

## 2020-05-22 DIAGNOSIS — I5032 Chronic diastolic (congestive) heart failure: Secondary | ICD-10-CM | POA: Diagnosis not present

## 2020-05-22 DIAGNOSIS — E1140 Type 2 diabetes mellitus with diabetic neuropathy, unspecified: Secondary | ICD-10-CM | POA: Diagnosis not present

## 2020-05-22 DIAGNOSIS — M4802 Spinal stenosis, cervical region: Secondary | ICD-10-CM | POA: Diagnosis not present

## 2020-05-22 DIAGNOSIS — F0280 Dementia in other diseases classified elsewhere without behavioral disturbance: Secondary | ICD-10-CM | POA: Diagnosis not present

## 2020-05-22 DIAGNOSIS — M48062 Spinal stenosis, lumbar region with neurogenic claudication: Secondary | ICD-10-CM | POA: Diagnosis not present

## 2020-05-29 DIAGNOSIS — I110 Hypertensive heart disease with heart failure: Secondary | ICD-10-CM | POA: Diagnosis not present

## 2020-05-29 DIAGNOSIS — I482 Chronic atrial fibrillation, unspecified: Secondary | ICD-10-CM | POA: Diagnosis not present

## 2020-05-29 DIAGNOSIS — M48062 Spinal stenosis, lumbar region with neurogenic claudication: Secondary | ICD-10-CM | POA: Diagnosis not present

## 2020-05-29 DIAGNOSIS — F0280 Dementia in other diseases classified elsewhere without behavioral disturbance: Secondary | ICD-10-CM | POA: Diagnosis not present

## 2020-05-29 DIAGNOSIS — E559 Vitamin D deficiency, unspecified: Secondary | ICD-10-CM | POA: Diagnosis not present

## 2020-05-29 DIAGNOSIS — I5032 Chronic diastolic (congestive) heart failure: Secondary | ICD-10-CM | POA: Diagnosis not present

## 2020-05-29 DIAGNOSIS — E1140 Type 2 diabetes mellitus with diabetic neuropathy, unspecified: Secondary | ICD-10-CM | POA: Diagnosis not present

## 2020-05-29 DIAGNOSIS — M4802 Spinal stenosis, cervical region: Secondary | ICD-10-CM | POA: Diagnosis not present

## 2020-06-05 DIAGNOSIS — E1140 Type 2 diabetes mellitus with diabetic neuropathy, unspecified: Secondary | ICD-10-CM | POA: Diagnosis not present

## 2020-06-05 DIAGNOSIS — Z951 Presence of aortocoronary bypass graft: Secondary | ICD-10-CM | POA: Diagnosis not present

## 2020-06-05 DIAGNOSIS — I2721 Secondary pulmonary arterial hypertension: Secondary | ICD-10-CM | POA: Diagnosis not present

## 2020-06-05 DIAGNOSIS — Z79891 Long term (current) use of opiate analgesic: Secondary | ICD-10-CM | POA: Diagnosis not present

## 2020-06-05 DIAGNOSIS — I5032 Chronic diastolic (congestive) heart failure: Secondary | ICD-10-CM | POA: Diagnosis not present

## 2020-06-05 DIAGNOSIS — G4733 Obstructive sleep apnea (adult) (pediatric): Secondary | ICD-10-CM | POA: Diagnosis not present

## 2020-06-05 DIAGNOSIS — E559 Vitamin D deficiency, unspecified: Secondary | ICD-10-CM | POA: Diagnosis not present

## 2020-06-05 DIAGNOSIS — M4802 Spinal stenosis, cervical region: Secondary | ICD-10-CM | POA: Diagnosis not present

## 2020-06-05 DIAGNOSIS — Z7901 Long term (current) use of anticoagulants: Secondary | ICD-10-CM | POA: Diagnosis not present

## 2020-06-05 DIAGNOSIS — I110 Hypertensive heart disease with heart failure: Secondary | ICD-10-CM | POA: Diagnosis not present

## 2020-06-05 DIAGNOSIS — Z792 Long term (current) use of antibiotics: Secondary | ICD-10-CM | POA: Diagnosis not present

## 2020-06-05 DIAGNOSIS — I482 Chronic atrial fibrillation, unspecified: Secondary | ICD-10-CM | POA: Diagnosis not present

## 2020-06-05 DIAGNOSIS — M48062 Spinal stenosis, lumbar region with neurogenic claudication: Secondary | ICD-10-CM | POA: Diagnosis not present

## 2020-06-05 DIAGNOSIS — D649 Anemia, unspecified: Secondary | ICD-10-CM | POA: Diagnosis not present

## 2020-06-05 DIAGNOSIS — I2510 Atherosclerotic heart disease of native coronary artery without angina pectoris: Secondary | ICD-10-CM | POA: Diagnosis not present

## 2020-06-14 ENCOUNTER — Other Ambulatory Visit: Payer: Self-pay

## 2020-06-14 DIAGNOSIS — J42 Unspecified chronic bronchitis: Secondary | ICD-10-CM

## 2020-06-14 DIAGNOSIS — I10 Essential (primary) hypertension: Secondary | ICD-10-CM

## 2020-06-18 ENCOUNTER — Other Ambulatory Visit: Payer: Self-pay | Admitting: *Deleted

## 2020-06-18 DIAGNOSIS — J42 Unspecified chronic bronchitis: Secondary | ICD-10-CM

## 2020-06-18 DIAGNOSIS — I2510 Atherosclerotic heart disease of native coronary artery without angina pectoris: Secondary | ICD-10-CM

## 2020-06-18 DIAGNOSIS — I10 Essential (primary) hypertension: Secondary | ICD-10-CM

## 2020-06-18 MED ORDER — BUDESONIDE-FORMOTEROL FUMARATE 80-4.5 MCG/ACT IN AERO: 2.0000 | INHALATION_SPRAY | Freq: Two times a day (BID) | RESPIRATORY_TRACT | 0 refills | Status: DC

## 2020-06-18 MED ORDER — METOPROLOL SUCCINATE ER 100 MG PO TB24: 100.0000 mg | ORAL_TABLET | Freq: Every day | ORAL | 0 refills | Status: DC

## 2020-06-18 MED ORDER — POTASSIUM CHLORIDE CRYS ER 20 MEQ PO TBCR: EXTENDED_RELEASE_TABLET | ORAL | 0 refills | Status: DC

## 2020-06-18 MED ORDER — DONEPEZIL HCL 5 MG PO TABS
5.0000 mg | ORAL_TABLET | Freq: Every day | ORAL | 0 refills | Status: DC
Start: 2020-06-18 — End: 2020-07-23

## 2020-06-18 MED ORDER — ATORVASTATIN CALCIUM 40 MG PO TABS: 40.0000 mg | ORAL_TABLET | Freq: Every day | ORAL | 0 refills | Status: DC

## 2020-06-18 MED ORDER — FUROSEMIDE 40 MG PO TABS: ORAL_TABLET | ORAL | 3 refills | Status: DC

## 2020-06-18 MED ORDER — WARFARIN SODIUM 5 MG PO TABS
5.0000 mg | ORAL_TABLET | Freq: Every day | ORAL | 0 refills | Status: DC
Start: 2020-06-18 — End: 2020-07-23

## 2020-06-18 NOTE — Telephone Encounter (Signed)
Talked to patient's daughter [name] and she states that she will have sister make an appointment for her dad.  Ozella Almond, [place]

## 2020-07-22 NOTE — Progress Notes (Unsigned)
    SUBJECTIVE:   CHIEF COMPLAINT / HPI:   Patient accompanied by his daughter Rodderick Holtzer who he lives with  Bilateral lower extremity weakness/multifactorial weakness: MRI of the cervical spine and MRI of the lumbar spine showed multiple chronic degenerative disease and lumbar canal stenosis but no acute findings Discussed with neurosurgery, do not suggest or benefit with any surgical intervention According to daughter he is walking with walker and doing ok.   No falls   A. fib:  Taking metoprolol and warfarin.  No chest pain complaints or any bleeding.  Taking lasix daily.  No edema  Diabetes,  Not taking any medications.    Hypercalcemia:  No complaints of cramps or muscle pain.  Has had this intermittently.  Labs drawn in hospital  Diarrhea - have frequent loose stools for long time.  No currently using imodium or lomotil but has in past  PERTINENT  PMH / PSH: continues hard of hearing  OBJECTIVE:   BP 110/70   Pulse 89   Wt 191 lb 3.2 oz (86.7 kg)   SpO2 97%   BMI 28.24 kg/m   Heart - irreg irreg Lungs:  Normal respiratory effort, chest expands symmetrically. Lungs are clear to auscultation, no crackles or wheezes. Extremities:  No cyanosis, edema, or deformity noted with good range of motion of all major joints.     ASSESSMENT/PLAN:   Atrial fibrillation (HCC) Stable on current medications.  Will adjust warfarin  Type 2 diabetes mellitus with diabetic neuropathy, unspecified ([place]) Good diet control   Cerumen impaction Irrigated in office   Hypercalcemia Will check follow up CMET.  In October PTH was in low normal range and Calcium was normal   Lower extremity weakness Ambulating with walker and seems stable.    Incontinence, feces Chronic loose stools.  Will refill imodium.       Lind Covert, MD [place]

## 2020-07-23 ENCOUNTER — Encounter: Payer: Self-pay | Admitting: Family Medicine

## 2020-07-23 ENCOUNTER — Other Ambulatory Visit: Payer: Self-pay

## 2020-07-23 ENCOUNTER — Ambulatory Visit (INDEPENDENT_AMBULATORY_CARE_PROVIDER_SITE_OTHER): Payer: Medicare Other | Admitting: Family Medicine

## 2020-07-23 VITALS — BP 110/70 | HR 89 | Wt 191.2 lb

## 2020-07-23 DIAGNOSIS — E210 Primary hyperparathyroidism: Secondary | ICD-10-CM

## 2020-07-23 DIAGNOSIS — I10 Essential (primary) hypertension: Secondary | ICD-10-CM | POA: Diagnosis not present

## 2020-07-23 DIAGNOSIS — H6121 Impacted cerumen, right ear: Secondary | ICD-10-CM

## 2020-07-23 DIAGNOSIS — R29898 Other symptoms and signs involving the musculoskeletal system: Secondary | ICD-10-CM

## 2020-07-23 DIAGNOSIS — E1140 Type 2 diabetes mellitus with diabetic neuropathy, unspecified: Secondary | ICD-10-CM

## 2020-07-23 DIAGNOSIS — I4811 Longstanding persistent atrial fibrillation: Secondary | ICD-10-CM | POA: Diagnosis not present

## 2020-07-23 DIAGNOSIS — I2510 Atherosclerotic heart disease of native coronary artery without angina pectoris: Secondary | ICD-10-CM

## 2020-07-23 DIAGNOSIS — R159 Full incontinence of feces: Secondary | ICD-10-CM

## 2020-07-23 LAB — POCT GLYCOSYLATED HEMOGLOBIN (HGB A1C): Hemoglobin A1C: 6.1 % — AB (ref 4.0–5.6)

## 2020-07-23 LAB — POCT INR: INR: 1.7 — AB (ref 2.0–3.0)

## 2020-07-23 MED ORDER — DONEPEZIL HCL 5 MG PO TABS
5.0000 mg | ORAL_TABLET | Freq: Every day | ORAL | 0 refills | Status: DC
Start: 2020-07-23 — End: 2021-02-07

## 2020-07-23 MED ORDER — WARFARIN SODIUM 5 MG PO TABS: 5.0000 mg | ORAL_TABLET | Freq: Every day | ORAL | 0 refills | Status: DC

## 2020-07-23 MED ORDER — NITROGLYCERIN 0.4 MG SL SUBL: 0.4000 mg | SUBLINGUAL_TABLET | SUBLINGUAL | 6 refills | Status: DC | PRN

## 2020-07-23 MED ORDER — FUROSEMIDE 40 MG PO TABS: ORAL_TABLET | ORAL | 1 refills | Status: DC

## 2020-07-23 MED ORDER — METOPROLOL SUCCINATE ER 100 MG PO TB24: 100.0000 mg | ORAL_TABLET | Freq: Every day | ORAL | 0 refills | Status: DC

## 2020-07-23 MED ORDER — ATORVASTATIN CALCIUM 40 MG PO TABS: 40.0000 mg | ORAL_TABLET | Freq: Every day | ORAL | 0 refills | Status: DC

## 2020-07-23 MED ORDER — LOPERAMIDE HCL 2 MG PO TABS: 2.0000 mg | ORAL_TABLET | Freq: Four times a day (QID) | ORAL | 3 refills | Status: DC | PRN

## 2020-07-23 NOTE — Assessment & Plan Note (Signed)
Will check follow up CMET.  In October PTH was in low normal range and Calcium was normal

## 2020-07-23 NOTE — Assessment & Plan Note (Signed)
Ambulating with walker and seems stable.

## 2020-07-23 NOTE — Assessment & Plan Note (Signed)
Good diet control

## 2020-07-23 NOTE — Patient Instructions (Addendum)
Good to see you today - Thank you for coming in  Things we discussed today:  Your weight looks stable  Your R ear - continue to wash out once a day  Try imodiumAD  as needed for loose stools   I will call you if your lab tests are not normal.  Otherwise we will discuss them at your next visit.  Please always bring your medication bottles  I will send all medications to [place] back to see me in 1 month

## 2020-07-23 NOTE — Assessment & Plan Note (Signed)
Chronic loose stools.  Will refill imodium.

## 2020-07-23 NOTE — Assessment & Plan Note (Signed)
Irrigated in office

## 2020-07-23 NOTE — Assessment & Plan Note (Signed)
Stable on current medications.  Will adjust warfarin

## 2020-07-24 ENCOUNTER — Telehealth: Payer: Self-pay | Admitting: Family Medicine

## 2020-07-24 LAB — CBC
Hematocrit: 43.8 % (ref 37.5–51.0)
Hemoglobin: 13.7 g/dL (ref 13.0–17.7)
MCH: 27.1 pg (ref 26.6–33.0)
MCHC: 31.3 g/dL — ABNORMAL LOW (ref 31.5–35.7)
MCV: 87 fL (ref 79–97)
Platelets: 179 10*3/uL (ref 150–450)
RBC: 5.06 x10E6/uL (ref 4.14–5.80)
RDW: 15.5 % — ABNORMAL HIGH (ref 11.6–15.4)
WBC: 5.6 10*3/uL (ref 3.4–10.8)

## 2020-07-24 LAB — CMP14+EGFR
ALT: 13 IU/L (ref 0–44)
AST: 16 IU/L (ref 0–40)
Albumin/Globulin Ratio: 1.5 (ref 1.2–2.2)
Albumin: 4.4 g/dL (ref 3.7–4.7)
Alkaline Phosphatase: 114 IU/L (ref 44–121)
BUN/Creatinine Ratio: 10 (ref 10–24)
BUN: 10 mg/dL (ref 8–27)
Bilirubin Total: 0.5 mg/dL (ref 0.0–1.2)
CO2: 27 mmol/L (ref 20–29)
Calcium: 10.8 mg/dL — ABNORMAL HIGH (ref 8.6–10.2)
Chloride: 103 mmol/L (ref 96–106)
Creatinine, Ser: 1.05 mg/dL (ref 0.76–1.27)
GFR calc Af Amer: 79 mL/min/{1.73_m2} (ref >59)
GFR calc non Af Amer: 68 mL/min/{1.73_m2} (ref >59)
Globulin, Total: 3 g/dL (ref 1.5–4.5)
Glucose: 112 mg/dL — ABNORMAL HIGH (ref 65–99)
Potassium: 4.1 mmol/L (ref 3.5–5.2)
Sodium: 146 mmol/L — ABNORMAL HIGH (ref 134–144)
Total Protein: 7.4 g/dL (ref 6.0–8.5)

## 2020-07-24 NOTE — Telephone Encounter (Signed)
Spoke with daughter   Labs look like he is dehyrated with elevated Na  Recommend decreasing lasix to 1/2 tab every other day

## 2020-07-24 NOTE — Assessment & Plan Note (Signed)
Latest lab elevated but maybe dehyrated will decrease lasix and recheck next visit

## 2020-08-13 ENCOUNTER — Ambulatory Visit (INDEPENDENT_AMBULATORY_CARE_PROVIDER_SITE_OTHER): Payer: Medicare Other | Admitting: Family Medicine

## 2020-08-13 ENCOUNTER — Other Ambulatory Visit: Payer: Self-pay

## 2020-08-13 ENCOUNTER — Encounter: Payer: Self-pay | Admitting: Family Medicine

## 2020-08-13 VITALS — BP 118/72 | HR 75 | Wt 187.8 lb

## 2020-08-13 DIAGNOSIS — H6121 Impacted cerumen, right ear: Secondary | ICD-10-CM | POA: Diagnosis not present

## 2020-08-13 DIAGNOSIS — I5033 Acute on chronic diastolic (congestive) heart failure: Secondary | ICD-10-CM | POA: Diagnosis not present

## 2020-08-13 DIAGNOSIS — E1140 Type 2 diabetes mellitus with diabetic neuropathy, unspecified: Secondary | ICD-10-CM

## 2020-08-13 DIAGNOSIS — R300 Dysuria: Secondary | ICD-10-CM

## 2020-08-13 DIAGNOSIS — E210 Primary hyperparathyroidism: Secondary | ICD-10-CM | POA: Diagnosis not present

## 2020-08-13 NOTE — Assessment & Plan Note (Deleted)
Will recheck Ca today after hopefully better hydrated

## 2020-08-13 NOTE — Assessment & Plan Note (Signed)
Improved but persistent.  Continue home treatment

## 2020-08-13 NOTE — Assessment & Plan Note (Signed)
Will recheck today.  Hopefully improved with better hydration and decreased lasix.

## 2020-08-13 NOTE — Patient Instructions (Addendum)
Good to see you today - Thank you for coming in  Things we discussed today:  EAR WAX Use the Murine ear wax drops twice a day leave in for 10 minutes then wash out  I will call you if your lab tests  And urine if they are not normal.  Otherwise we will discuss them at your next visit.  Please always bring your medication bottles  Come back to see me in 4 weeks

## 2020-08-13 NOTE — Assessment & Plan Note (Addendum)
Reduced lasix but unsure of current dose.  Asked to return with all his medications

## 2020-08-13 NOTE — Progress Notes (Signed)
    SUBJECTIVE:   CHIEF COMPLAINT / HPI:   HyperNatremia Elevated Calcium - did not bring his medications as his son forgot.  Unsure how much lasix he is now taking.  Ambulating about the same  Cerumen Impaction - according to son they are using it daily.  Hearing is about the same  Urinary Frequency and some Pain - according to Mr Yera has been having mild pain with urination on an off.  Worse if waits to urinate.  No fever or bleeding. Wears depends   PERTINENT  PMH / PSH: chronic warfarin   OBJECTIVE:   BP 118/72   Pulse 75   Wt 187 lb 12.8 oz (85.2 kg)   SpO2 97%   BMI 27.73 kg/m   Alert conversant Walking well with walker R Ear - hard cerumen deep in canal obscures TM.  L ear is clear No edema  ASSESSMENT/PLAN:   Hypercalcemia Will recheck today.  Hopefully improved with better hydration and decreased lasix.  Acute on chronic congestive heart failure with left ventricular diastolic dysfunction (HCC) Reduced lasix but unsure of current dose.  Asked to return with all his medications   Cerumen impaction Improved but persistent.  Continue home treatment  Dysuria Given his congnitive impairment unsure if is having new symptoms or if this is persistent probably BPH.  Unable to give UA today.  Hopefully can bring in.  No signs of upper tract infection.      Lind Covert, MD [place]

## 2020-08-13 NOTE — Assessment & Plan Note (Signed)
Given his congnitive impairment unsure if is having new symptoms or if this is persistent probably BPH.  Unable to give UA today.  Hopefully can bring in.  No signs of upper tract infection.

## 2020-08-14 LAB — POCT URINALYSIS DIP (MANUAL ENTRY)
Bilirubin, UA: NEGATIVE
Blood, UA: NEGATIVE
Glucose, UA: NEGATIVE mg/dL
Leukocytes, UA: NEGATIVE
Nitrite, UA: NEGATIVE
Protein Ur, POC: NEGATIVE mg/dL
Spec Grav, UA: >=1.03 — AB (ref 1.010–1.025)
Urobilinogen, UA: 0.2 E.U./dL
pH, UA: 5 (ref 5.0–8.0)

## 2020-08-14 LAB — BASIC METABOLIC PANEL
BUN/Creatinine Ratio: 13 (ref 10–24)
BUN: 13 mg/dL (ref 8–27)
CO2: 24 mmol/L (ref 20–29)
Calcium: 11.2 mg/dL — ABNORMAL HIGH (ref 8.6–10.2)
Chloride: 99 mmol/L (ref 96–106)
Creatinine, Ser: 1.03 mg/dL (ref 0.76–1.27)
Glucose: 119 mg/dL — ABNORMAL HIGH (ref 65–99)
Potassium: 4.6 mmol/L (ref 3.5–5.2)
Sodium: 138 mmol/L (ref 134–144)
eGFR: 75 mL/min/{1.73_m2} (ref >59)

## 2020-08-14 NOTE — Addendum Note (Signed)
Addended by: Londell Moh T on: 08/14/2020 02:41 PM   Modules accepted: Orders

## 2020-08-14 NOTE — Addendum Note (Signed)
Addended by: Londell Moh T on: 08/14/2020 02:39 PM   Modules accepted: Orders

## 2020-08-19 ENCOUNTER — Other Ambulatory Visit: Payer: Self-pay | Admitting: Family Medicine

## 2020-08-19 DIAGNOSIS — I2510 Atherosclerotic heart disease of native coronary artery without angina pectoris: Secondary | ICD-10-CM

## 2020-09-17 ENCOUNTER — Ambulatory Visit: Payer: Medicare Other | Admitting: Family Medicine

## 2020-09-24 ENCOUNTER — Other Ambulatory Visit: Payer: Self-pay | Admitting: Family Medicine

## 2020-09-24 ENCOUNTER — Other Ambulatory Visit: Payer: Self-pay

## 2020-09-24 ENCOUNTER — Encounter: Payer: Self-pay | Admitting: Family Medicine

## 2020-09-24 ENCOUNTER — Ambulatory Visit (INDEPENDENT_AMBULATORY_CARE_PROVIDER_SITE_OTHER): Payer: Medicare Other | Admitting: Family Medicine

## 2020-09-24 DIAGNOSIS — R300 Dysuria: Secondary | ICD-10-CM | POA: Diagnosis not present

## 2020-09-24 DIAGNOSIS — I359 Nonrheumatic aortic valve disorder, unspecified: Secondary | ICD-10-CM | POA: Diagnosis not present

## 2020-09-24 DIAGNOSIS — I5033 Acute on chronic diastolic (congestive) heart failure: Secondary | ICD-10-CM

## 2020-09-24 LAB — POCT INR: INR: 2.2 (ref 2.0–3.0)

## 2020-09-24 MED ORDER — FLUTICASONE PROPIONATE 50 MCG/ACT NA SUSP: 2.0000 | Freq: Every day | NASAL | 6 refills | Status: DC

## 2020-09-24 NOTE — Patient Instructions (Addendum)
Good to see you today - Thank you for coming in  Things we discussed today:  Cut back Lasix to one tablet each morning  Stop and vitamins or Vitamin D  Call or send by MyChart his medications   Please bring in urine sample -   Please always bring your medication bottles both prescription and OTC   Use the flonase daily for at least the next 2 months  I will call you if your lab tests are not normal.  Otherwise we will discuss them at your next visit.  Come back to see me in 1-2 weeks

## 2020-09-24 NOTE — Assessment & Plan Note (Signed)
Unsure of cause.  Unsure if he is taking vitamin D.  Will recheck labs including vitamin d levels.

## 2020-09-24 NOTE — Assessment & Plan Note (Signed)
Seems controlled.  Will ask to take lasix once per day

## 2020-09-24 NOTE — Progress Notes (Signed)
    SUBJECTIVE:   CHIEF COMPLAINT / HPI:   HyperCalcemia No history of cramps but hard to tell given his dementia.  His daughter feels his other daughter may be giving otc Vit D.  Did not bring in medications today  Urinary Frequency and some Pain - this seems to be an intermittent chronic problem.  Has been treated for UTI in past empirically.  No fever or bleeding or current back pain.  When asked he feels that he doesn't have any new problems now.  Edema CHF Not much today.  Unsure how much lasix he is taking  PERTINENT  PMH / PSH: His daughter who he lives with is not here today.  Other daughter and son accompany him today.  Are not sure of his current medications.  They volunteered to call and give his current medications   OBJECTIVE:   BP 98/62   Pulse 64   Wt 183 lb 9.6 oz (83.3 kg)   SpO2 97%   BMI 27.11 kg/m   Alert hard of hearing Heart - Regular rate and rhythm.  No murmurs, gallops or rubs.    Lungs:  Normal respiratory effort, chest expands symmetrically. Lungs are clear to auscultation, no crackles or wheezes. No pitting edema   ASSESSMENT/PLAN:   Acute on chronic congestive heart failure with left ventricular diastolic dysfunction (HCC) Seems controlled.  Will ask to take lasix once per day  Hypercalcemia Unsure of cause.  Unsure if he is taking vitamin D.  Will recheck labs including vitamin d levels.   Dysuria Intermittent.  Not able to give UA today.  Will check PSA and try to get urine from home next visit.       Lind Covert, MD [place]

## 2020-09-24 NOTE — Assessment & Plan Note (Signed)
Intermittent.  Not able to give UA today.  Will check PSA and try to get urine from home next visit.

## 2020-09-25 NOTE — Addendum Note (Signed)
Addended by: Talbert Cage L on: 09/25/2020 08:40 AM   Modules accepted: Orders

## 2020-09-25 NOTE — Addendum Note (Signed)
Addended by: Talbert Cage L on: 09/25/2020 08:51 AM   Modules accepted: Orders

## 2020-09-26 LAB — PTH, INTACT AND CALCIUM
Calcium: 11 mg/dL — ABNORMAL HIGH (ref 8.6–10.2)
PTH: 57 pg/mL (ref 15–65)

## 2020-10-04 LAB — VITAMIN D 1,25 DIHYDROXY
Vitamin D 1, 25 (OH)2 Total: 38 pg/mL
Vitamin D2 1, 25 (OH)2: <10 pg/mL
Vitamin D3 1, 25 (OH)2: 38 pg/mL

## 2020-10-04 LAB — VITAMIN D 25 HYDROXY (VIT D DEFICIENCY, FRACTURES): Vit D, 25-Hydroxy: 34.9 ng/mL (ref 30.0–100.0)

## 2020-10-04 LAB — PSA: Prostate Specific Ag, Serum: 0.8 ng/mL (ref 0.0–4.0)

## 2020-10-09 ENCOUNTER — Ambulatory Visit: Payer: Medicare Other | Admitting: Family Medicine

## 2020-11-08 ENCOUNTER — Ambulatory Visit: Payer: Medicare Other | Admitting: Licensed Clinical Social Worker

## 2020-11-08 DIAGNOSIS — Z7189 Other specified counseling: Secondary | ICD-10-CM

## 2020-11-08 NOTE — Chronic Care Management (AMB) (Signed)
  Care Management   Clinical Social Work Phone Note  11/08/2020 Name: Matthew Edwards MRN: [id number] DOB: 1941-04-13 Matthew Edwards is a 80 y.o. year old male who sees Chambliss, Jeb Levering, MD for primary care.    The Care Management team was consulted to assess patient's needs after disconnection with services provided by [facility].  Assessment: Patient's daughter Matthew Edwards provided all information during this encounter. Patient lives alone, has support system with two daughter that provide his meals daily.  Daughter reports missing [facility] as it helped with monitoring patient INR and they are not always able to get to the providers office. Review and discussed options.  Reminded them of appointment with PCP 11/19/2020.  Recommendation: Patient may benefit from, and is in agreement to explore possible home health. LCSW will collaborate with PCP for ongoing needs and explore to see if there are other options for patient. In-base message sent to PCP.  Follow up Plan: No follow up scheduled with CCM team at this time. Will follow up with patient's daughter after appointment with PCP on 11/19/20.   Review of patient status, including review of consultants reports, relevant laboratory and other test results, and collaboration with appropriate care team members and the patient's provider was performed as part of comprehensive patient evaluation and provision of chronic care management services.       Information about Care Management services was shared with Mr.  Romey today including:  Care Management services include personalized support from designated clinical staff supervised by his physician, including individualized plan of care and coordination with other care providers Remind patient of 24/7 contact phone numbers to provider's office for assistance with urgent and routine care needs. Care Management services are voluntary and patient may stop at any time .   Patient agreed to services  provided today and verbal consent obtained.     Casimer Lanius, [place] / [place]   [phone] 4:17 PM

## 2020-11-19 ENCOUNTER — Ambulatory Visit (INDEPENDENT_AMBULATORY_CARE_PROVIDER_SITE_OTHER): Payer: Medicare Other | Admitting: Family Medicine

## 2020-11-19 ENCOUNTER — Encounter: Payer: Self-pay | Admitting: Family Medicine

## 2020-11-19 ENCOUNTER — Other Ambulatory Visit: Payer: Self-pay

## 2020-11-19 VITALS — BP 118/72 | HR 65 | Wt 188.6 lb

## 2020-11-19 DIAGNOSIS — R634 Abnormal weight loss: Secondary | ICD-10-CM

## 2020-11-19 DIAGNOSIS — I359 Nonrheumatic aortic valve disorder, unspecified: Secondary | ICD-10-CM | POA: Diagnosis not present

## 2020-11-19 DIAGNOSIS — I10 Essential (primary) hypertension: Secondary | ICD-10-CM | POA: Diagnosis not present

## 2020-11-19 DIAGNOSIS — R300 Dysuria: Secondary | ICD-10-CM

## 2020-11-19 DIAGNOSIS — E1140 Type 2 diabetes mellitus with diabetic neuropathy, unspecified: Secondary | ICD-10-CM | POA: Diagnosis not present

## 2020-11-19 LAB — POCT GLYCOSYLATED HEMOGLOBIN (HGB A1C): HbA1c, POC (controlled diabetic range): 6.1 % (ref 0.0–7.0)

## 2020-11-19 LAB — POCT INR: INR: 2.8 (ref 2.0–3.0)

## 2020-11-19 MED ORDER — POTASSIUM CHLORIDE CRYS ER 20 MEQ PO TBCR
EXTENDED_RELEASE_TABLET | ORAL | 0 refills | Status: DC
Start: 2020-11-19 — End: 2021-02-07

## 2020-11-19 MED ORDER — TAMSULOSIN HCL 0.4 MG PO CAPS: 0.4000 mg | ORAL_CAPSULE | Freq: Every day | ORAL | 3 refills | Status: DC

## 2020-11-19 NOTE — Assessment & Plan Note (Signed)
At goal today with diet control

## 2020-11-19 NOTE — Progress Notes (Signed)
    SUBJECTIVE:   CHIEF COMPLAINT / HPI:   Diabetes   HyperCalcemia No history of cramps but hard to tell given his dementia. Vit D is not on the hand written list of his medications.  Not sure what vits he is taking. Did not bring in medications today    Urinary Frequency and some Pain -  Continues to have frequent urination sometimes painful associated with incontinence.  No fever or bleeding  Edema CHF They do not feel he has much edema.  Unsure how much lasix he is taking   PERTINENT  PMH / PSH: Daughter who he lives with accompanies him today  OBJECTIVE:   BP 118/72   Pulse 65   Wt 188 lb 9.6 oz (85.5 kg)   SpO2 96%   BMI 27.85 kg/m   Alert hard of hearing H- occaisional skipped beat L- clear Extrem - trace edema bilaterally  ASSESSMENT/PLAN:   Dysuria Given recent normal urine seems to be most consistent with LUTS due to BPH.  Trial of flomax.  His dementia and probable spinal stenosis factor into his incontinence  Type 2 diabetes mellitus with diabetic neuropathy, unspecified ([place]) At goal today with diet control   Weight loss Has actually gained weight today      Lind Covert, MD [place]

## 2020-11-19 NOTE — Assessment & Plan Note (Signed)
Given recent normal urine seems to be most consistent with LUTS due to BPH.  Trial of flomax.  His dementia and probable spinal stenosis factor into his incontinence

## 2020-11-19 NOTE — Assessment & Plan Note (Signed)
Has actually gained weight today

## 2020-11-19 NOTE — Patient Instructions (Signed)
Good to see you today - Thank you for coming in  Things we discussed today:  For the urine - Try flomax 0.4 mg every night.  That should decrease his urine irritation  You have gained 5 lbs since April   Use imodium when you don't want to have a bowel movement   Please always bring your medication bottles  Come back to see me in 3 months

## 2020-12-05 ENCOUNTER — Telehealth: Payer: Self-pay | Admitting: *Deleted

## 2020-12-05 NOTE — Telephone Encounter (Signed)
Called and spoke to pt's daughter Matthew Edwards and made her aware that we could start monitoring pt's INR and asked if we could set up appointment for pt to have INR checked. Matthew Edwards stated that she would prefer if Dr. Margaretha [facility] office could continue to monitor pt's INR because pt is familiar with the staff over there. Informed her that I would let Dr. Margaretha [facility] office know.

## 2020-12-05 NOTE — Telephone Encounter (Signed)
Pt is showing up in the [address] office overdue list to have INR checked. Per chart pt is followed by [name] office. Message sent to [facility] from [name] office  making him aware pt was showing up on our list that he is over due to have INR checked. Matthew Edwards then [employer] back that Dr Erin Hearing is asking if [facility] can monitor pt's INR. Pt's hx includes Afib and pt is followed by [name]. Matthew Edwards stated that pt is able to come in for appointment's to have INR checked. Informed Matthew Edwards that I would call pt to see if we could establish him as a new patient. Called and spoke to pt's daughter Matthew Edwards and asked if I could call pt's other daughter Matthew Edwards because she is the one takes pt's to his appointments. Called [facility] but have not been able to get in touch with her, voice mailbox not set up.

## 2021-02-04 ENCOUNTER — Ambulatory Visit: Payer: Medicare Other | Admitting: Family Medicine

## 2021-02-04 NOTE — Progress Notes (Deleted)
    SUBJECTIVE:   CHIEF COMPLAINT / HPI:   Dysuria Given recent normal urine seems to be most consistent with LUTS due to BPH.  Trial of flomax.  His dementia and probable spinal stenosis factor into his incontinence   Type 2 diabetes mellitus with diabetic neuropathy, unspecified ([place]) At goal today with diet control    Weight loss Has actually gained weight today      ***  PERTINENT  PMH / PSH: ***  OBJECTIVE:   There were no vitals taken for this visit.  ***  ASSESSMENT/PLAN:   No problem-specific Assessment & Plan notes found for this encounter.     Lind Covert, MD [place]

## 2021-02-07 ENCOUNTER — Encounter: Payer: Self-pay | Admitting: Family Medicine

## 2021-02-07 ENCOUNTER — Telehealth: Payer: Self-pay | Admitting: Family Medicine

## 2021-02-07 DIAGNOSIS — I2510 Atherosclerotic heart disease of native coronary artery without angina pectoris: Secondary | ICD-10-CM

## 2021-02-07 DIAGNOSIS — R159 Full incontinence of feces: Secondary | ICD-10-CM

## 2021-02-07 DIAGNOSIS — J42 Unspecified chronic bronchitis: Secondary | ICD-10-CM

## 2021-02-07 DIAGNOSIS — I10 Essential (primary) hypertension: Secondary | ICD-10-CM

## 2021-02-07 MED ORDER — ATORVASTATIN CALCIUM 40 MG PO TABS: ORAL_TABLET | ORAL | 3 refills | Status: DC

## 2021-02-07 MED ORDER — TAMSULOSIN HCL 0.4 MG PO CAPS: 0.4000 mg | ORAL_CAPSULE | Freq: Every day | ORAL | 3 refills | Status: DC

## 2021-02-07 MED ORDER — POTASSIUM CHLORIDE CRYS ER 20 MEQ PO TBCR: EXTENDED_RELEASE_TABLET | ORAL | 1 refills | Status: DC

## 2021-02-07 MED ORDER — BUDESONIDE-FORMOTEROL FUMARATE 80-4.5 MCG/ACT IN AERO: 2.0000 | INHALATION_SPRAY | Freq: Two times a day (BID) | RESPIRATORY_TRACT | 0 refills | Status: DC

## 2021-02-07 MED ORDER — DONEPEZIL HCL 5 MG PO TABS: 5.0000 mg | ORAL_TABLET | Freq: Every day | ORAL | 0 refills | Status: DC

## 2021-02-07 MED ORDER — LOPERAMIDE HCL 2 MG PO TABS: 2.0000 mg | ORAL_TABLET | Freq: Four times a day (QID) | ORAL | 3 refills | Status: DC | PRN

## 2021-02-07 MED ORDER — FUROSEMIDE 40 MG PO TABS: ORAL_TABLET | ORAL | 1 refills | Status: DC

## 2021-02-07 MED ORDER — METOPROLOL SUCCINATE ER 100 MG PO TB24: 100.0000 mg | ORAL_TABLET | Freq: Every day | ORAL | 0 refills | Status: DC

## 2021-02-07 NOTE — Addendum Note (Signed)
Addended by: Talbert Cage L on: 02/07/2021 02:09 PM   Modules accepted: Orders

## 2021-02-07 NOTE — Telephone Encounter (Signed)
Rx sent 

## 2021-02-07 NOTE — Telephone Encounter (Signed)
Patients daughter is here and would like to request refills on all of his medications. She would like them sent to [facility] on [address].

## 2021-03-04 ENCOUNTER — Ambulatory Visit (INDEPENDENT_AMBULATORY_CARE_PROVIDER_SITE_OTHER): Payer: Medicare Other | Admitting: Family Medicine

## 2021-03-04 ENCOUNTER — Other Ambulatory Visit: Payer: Self-pay

## 2021-03-04 VITALS — BP 121/60 | HR 62 | Wt 191.6 lb

## 2021-03-04 DIAGNOSIS — E1140 Type 2 diabetes mellitus with diabetic neuropathy, unspecified: Secondary | ICD-10-CM | POA: Diagnosis not present

## 2021-03-04 DIAGNOSIS — R300 Dysuria: Secondary | ICD-10-CM

## 2021-03-04 DIAGNOSIS — I359 Nonrheumatic aortic valve disorder, unspecified: Secondary | ICD-10-CM | POA: Diagnosis not present

## 2021-03-04 DIAGNOSIS — H6121 Impacted cerumen, right ear: Secondary | ICD-10-CM | POA: Diagnosis not present

## 2021-03-04 DIAGNOSIS — I10 Essential (primary) hypertension: Secondary | ICD-10-CM

## 2021-03-04 DIAGNOSIS — I4891 Unspecified atrial fibrillation: Secondary | ICD-10-CM

## 2021-03-04 LAB — POCT GLYCOSYLATED HEMOGLOBIN (HGB A1C): HbA1c, POC (controlled diabetic range): 5.7 % (ref 0.0–7.0)

## 2021-03-04 LAB — POCT INR: INR: 3.7 — AB (ref 2.0–3.0)

## 2021-03-04 MED ORDER — DONEPEZIL HCL 5 MG PO TABS: 5.0000 mg | ORAL_TABLET | Freq: Every day | ORAL | 0 refills | Status: DC

## 2021-03-04 MED ORDER — TAMSULOSIN HCL 0.4 MG PO CAPS: 0.4000 mg | ORAL_CAPSULE | Freq: Every day | ORAL | 3 refills | Status: DC

## 2021-03-04 NOTE — Assessment & Plan Note (Signed)
Well controlled with diet.  Do not need to check blood sugar

## 2021-03-04 NOTE — Assessment & Plan Note (Signed)
Seems asymptomatic.  Check labs

## 2021-03-04 NOTE — Assessment & Plan Note (Signed)
Still on R.  See after visit summary

## 2021-03-04 NOTE — Patient Instructions (Addendum)
Good to see you today - Thank you for coming in  Things we discussed today:  Debrox or generic ear wax remover in R ear once a day for 2 weeks   Take Lasix 20 mg 1/2 tab a day  Please always bring your medication bottles  Come back to see me in 2 weeks

## 2021-03-04 NOTE — Assessment & Plan Note (Signed)
Seems stable.  Restart flomax

## 2021-03-04 NOTE — Assessment & Plan Note (Signed)
At goal today.  Emphasized only take 1/2 of 40 mg lasix per day

## 2021-03-04 NOTE — Assessment & Plan Note (Signed)
Rate controlled.  Adjusted warfarin.  Come back in 2 weeks to recheck

## 2021-03-04 NOTE — Progress Notes (Signed)
    SUBJECTIVE:   CHIEF COMPLAINT / HPI:   Dysuria Feels this is some better.  Ran out of flomax  Diabetes Diet controlled.    HyperCalcium  No specific cramps.  No recent history of renal stones   Dementia Daughter feels is worsening.  Ran out of aricept.    PERTINENT  PMH / PSH: accompanied by daughter [phone].  She is on dialysis.  Is concerned with the cost of his medications   OBJECTIVE:   BP 121/60   Pulse 62   Wt 191 lb 9.6 oz (86.9 kg)   SpO2 96%   BMI 28.29 kg/m   Hard of hearing Denies specific pain Heart - Regular rate and rhythm.  No murmurs, gallops or rubs.    Lungs:  Normal respiratory effort, chest expands symmetrically. Lungs are clear to auscultation, no crackles or wheezes.  No edema Feet - overgrown toes (has podiatry appointment) no skin breakdown on soles  ASSESSMENT/PLAN:   Essential hypertension At goal today.  Emphasized only take 1/2 of 40 mg lasix per day   Atrial fibrillation ([place]) Rate controlled.  Adjusted warfarin.  Come back in 2 weeks to recheck   Type 2 diabetes mellitus with diabetic neuropathy, unspecified ([place]) Well controlled with diet.  Do not need to check blood sugar   Cerumen impaction Still on R.  See after visit summary   Hypercalcemia Seems asymptomatic.  Check labs   Dysuria Seems stable.  Restart flomax      Lind Covert, MD [place]

## 2021-03-05 LAB — CMP14+EGFR
ALT: 22 IU/L (ref 0–44)
AST: 26 IU/L (ref 0–40)
Albumin/Globulin Ratio: 1.7 (ref 1.2–2.2)
Albumin: 4.3 g/dL (ref 3.7–4.7)
Alkaline Phosphatase: 90 IU/L (ref 44–121)
BUN/Creatinine Ratio: 13 (ref 10–24)
BUN: 11 mg/dL (ref 8–27)
Bilirubin Total: 0.9 mg/dL (ref 0.0–1.2)
CO2: 24 mmol/L (ref 20–29)
Calcium: 10.1 mg/dL (ref 8.6–10.2)
Chloride: 105 mmol/L (ref 96–106)
Creatinine, Ser: 0.85 mg/dL (ref 0.76–1.27)
Globulin, Total: 2.5 g/dL (ref 1.5–4.5)
Glucose: 94 mg/dL (ref 70–99)
Potassium: 3.9 mmol/L (ref 3.5–5.2)
Sodium: 141 mmol/L (ref 134–144)
Total Protein: 6.8 g/dL (ref 6.0–8.5)
eGFR: 89 mL/min/{1.73_m2} (ref >59)

## 2021-03-06 ENCOUNTER — Telehealth: Payer: Self-pay | Admitting: Family Medicine

## 2021-03-06 MED ORDER — GABAPENTIN 100 MG PO CAPS: 100.0000 mg | ORAL_CAPSULE | Freq: Every day | ORAL | 1 refills | Status: DC

## 2021-03-06 NOTE — Telephone Encounter (Signed)
Spoke with Matthew Edwards She thinks she can get his medications We decided to try low dose gabapentin at night to help with his neuropathy symptoms in hands and feet To call me with any problems

## 2021-03-10 LAB — CALCIUM, IONIZED: Calcium, Ion: 5.7 mg/dL — ABNORMAL HIGH (ref 4.5–5.6)

## 2021-03-10 LAB — PTH-RELATED PEPTIDE: PTH-related peptide: <2 pmol/L

## 2021-03-25 ENCOUNTER — Ambulatory Visit: Payer: Medicare Other | Admitting: Family Medicine

## 2021-04-07 NOTE — Progress Notes (Signed)
       SUBJECTIVE:   CHIEF COMPLAINT / HPI:   Leg Pain - Neuropathy Started low dose gabapentin last visit.  Thinks is about the same but hard to gauge due to his memory   Essential hypertension Taking lasix 20 mg TWICE A DAY due to confusion instead of once a day   Atrial fibrillation (HCC) Taking warfarin 7.5 mg daily except one 5 mg a week instead of prior alternating 5 and 7.5mg   No bleeding   Cerumen impaction Using softner most days.  Still has trouble hearing     PERTINENT  PMH / PSH: lives with his daughter who brings all his medications today  OBJECTIVE:   BP (!) 142/60   Pulse 61   Wt 186 lb 3.2 oz (84.5 kg)   SpO2 96%   BMI 27.50 kg/m   Alert cooperative No pedal edema  Ears - can visual both TMs partially by hard wax still present   ASSESSMENT/PLAN:   Essential hypertension At goal today.  Recommend lasix 20 mg once a day only   Atrial fibrillation (HCC) On warfarin not at goal.  Reduce dose and chart given by lab   Cerumen impaction Improving Continue intermittent wax softner      Lind Covert, MD [place]

## 2021-04-08 ENCOUNTER — Encounter: Payer: Self-pay | Admitting: Family Medicine

## 2021-04-08 ENCOUNTER — Ambulatory Visit (INDEPENDENT_AMBULATORY_CARE_PROVIDER_SITE_OTHER): Payer: Medicare Other | Admitting: Family Medicine

## 2021-04-08 ENCOUNTER — Other Ambulatory Visit: Payer: Self-pay

## 2021-04-08 DIAGNOSIS — I4811 Longstanding persistent atrial fibrillation: Secondary | ICD-10-CM | POA: Diagnosis not present

## 2021-04-08 DIAGNOSIS — I2510 Atherosclerotic heart disease of native coronary artery without angina pectoris: Secondary | ICD-10-CM | POA: Diagnosis not present

## 2021-04-08 DIAGNOSIS — I10 Essential (primary) hypertension: Secondary | ICD-10-CM | POA: Diagnosis not present

## 2021-04-08 DIAGNOSIS — H6121 Impacted cerumen, right ear: Secondary | ICD-10-CM | POA: Diagnosis not present

## 2021-04-08 LAB — POCT INR: INR: 3.7 — AB (ref 2.0–3.0)

## 2021-04-08 MED ORDER — ATORVASTATIN CALCIUM 40 MG PO TABS: ORAL_TABLET | ORAL | 3 refills | Status: DC

## 2021-04-08 MED ORDER — DONEPEZIL HCL 5 MG PO TABS: 5.0000 mg | ORAL_TABLET | Freq: Every day | ORAL | 0 refills | Status: DC

## 2021-04-08 MED ORDER — TAMSULOSIN HCL 0.4 MG PO CAPS: 0.4000 mg | ORAL_CAPSULE | Freq: Every day | ORAL | 3 refills | Status: DC

## 2021-04-08 MED ORDER — GABAPENTIN 100 MG PO CAPS: 100.0000 mg | ORAL_CAPSULE | Freq: Every day | ORAL | 1 refills | Status: DC

## 2021-04-08 MED ORDER — FUROSEMIDE 20 MG PO TABS
20.0000 mg | ORAL_TABLET | Freq: Every day | ORAL | 0 refills | Status: DC
Start: 2021-04-08 — End: 2021-05-20

## 2021-04-08 MED ORDER — METOPROLOL SUCCINATE ER 100 MG PO TB24: 100.0000 mg | ORAL_TABLET | Freq: Every day | ORAL | 0 refills | Status: DC

## 2021-04-08 MED ORDER — POTASSIUM CHLORIDE CRYS ER 20 MEQ PO TBCR
20.0000 meq | EXTENDED_RELEASE_TABLET | Freq: Every day | ORAL | 1 refills | Status: DC
Start: 2021-04-08 — End: 2023-10-12

## 2021-04-08 MED ORDER — WARFARIN SODIUM 5 MG PO TABS: 5.0000 mg | ORAL_TABLET | Freq: Every day | ORAL | 0 refills | Status: DC

## 2021-04-08 NOTE — Assessment & Plan Note (Signed)
Improving Continue intermittent wax softner

## 2021-04-08 NOTE — Assessment & Plan Note (Signed)
At goal today.  Recommend lasix 20 mg once a day only

## 2021-04-08 NOTE — Patient Instructions (Signed)
Good to see you today - Thank you for coming in  Things we discussed today:  I sent all his medications to [facility] mail order  Keep taking all the medications as you are  except the Lasix is once a day  Please always bring your medication bottles  Come back to see me in one month

## 2021-04-08 NOTE — Assessment & Plan Note (Signed)
On warfarin not at goal.  Reduce dose and chart given by lab

## 2021-05-07 ENCOUNTER — Telehealth: Payer: Self-pay

## 2021-05-07 NOTE — Telephone Encounter (Signed)
Called PCP Dr Margaretha Sheffield office to confirm pt's INR and Warfarin is being managed at their office. Spoke with [facility] and confirmed pt does come to their lab to have INR checked and Warfarin managed. Will discontinue episode.

## 2021-05-20 ENCOUNTER — Ambulatory Visit: Payer: Medicare Other | Admitting: Family Medicine

## 2021-05-20 ENCOUNTER — Telehealth: Payer: Self-pay | Admitting: Family Medicine

## 2021-05-20 DIAGNOSIS — I4811 Longstanding persistent atrial fibrillation: Secondary | ICD-10-CM

## 2021-05-20 DIAGNOSIS — I10 Essential (primary) hypertension: Secondary | ICD-10-CM

## 2021-05-20 DIAGNOSIS — I2510 Atherosclerotic heart disease of native coronary artery without angina pectoris: Secondary | ICD-10-CM

## 2021-05-20 MED ORDER — FUROSEMIDE 20 MG PO TABS: 20.0000 mg | ORAL_TABLET | Freq: Every day | ORAL | 0 refills | Status: DC

## 2021-05-20 MED ORDER — METOPROLOL SUCCINATE ER 100 MG PO TB24: 100.0000 mg | ORAL_TABLET | Freq: Every day | ORAL | 0 refills | Status: DC

## 2021-05-20 MED ORDER — WARFARIN SODIUM 5 MG PO TABS: ORAL_TABLET | ORAL | 1 refills | Status: DC

## 2021-05-20 MED ORDER — GABAPENTIN 100 MG PO CAPS: 100.0000 mg | ORAL_CAPSULE | Freq: Every day | ORAL | 1 refills | Status: DC

## 2021-05-20 MED ORDER — ATORVASTATIN CALCIUM 40 MG PO TABS: ORAL_TABLET | ORAL | 3 refills | Status: DC

## 2021-05-20 NOTE — Telephone Encounter (Signed)
Spoke with daughter [name] He is doing ok Will refill medications See him in January

## 2021-06-03 ENCOUNTER — Ambulatory Visit: Payer: Medicare Other | Admitting: Family Medicine

## 2021-06-09 NOTE — Progress Notes (Signed)
° ° °  SUBJECTIVE:   CHIEF COMPLAINT / HPI:   Weight Loss Has lost 3 lbs since las visit but weight is same as in April.   No trouble eating.  No fevers  Leg Pain - Neuropathy Low dose gabapentin seems to help some.  No falls    Essential hypertension Taking lasix 20 mg daily (1/2 40 mg tab) with no worsening edema   Atrial fibrillation (HCC) Taking warfarin alternating 5 and 7.5mg   No bleeding  Does not think missed any doses   OBJECTIVE:   BP 133/90    Pulse (!) 112    Wt 183 lb 6.4 oz (83.2 kg)    SpO2 99%    BMI 27.08 kg/m   Walks with walker Heart - Regular rate and rhythm.  No murmurs, gallops or rubs.    Lungs:  Normal respiratory effort, chest expands symmetrically. Lungs are clear to auscultation, no crackles or wheezes. No pedal edema   ASSESSMENT/PLAN:   Weight loss Fluctuates but stable overall.  Essential hypertension Blood pressure is stable on beta blocker and lower dose of lasix - continue   Atrial fibrillation (HCC) INR is too low today after too high last visit.  Had discuss with both his dtrs in person and via phone.  He could not tolerate apixaban in past due to falls.  No record of every taking rivaroxaban.  Given the better safety with DOACs compared to warfarin and his difficulty keeping INR in range and that cardiology ok'd DOAC for his value will plan to start rivaroxaban.  Rx sent and will bring in and check INR and then start next visit   Type 2 diabetes mellitus with diabetic neuropathy, unspecified (HCC) Good A1c.  Low dose gabapentin seems to help with his leg discomfort.      Lind Covert, MD [place]

## 2021-06-10 ENCOUNTER — Ambulatory Visit (INDEPENDENT_AMBULATORY_CARE_PROVIDER_SITE_OTHER): Payer: Medicare Other | Admitting: Family Medicine

## 2021-06-10 ENCOUNTER — Other Ambulatory Visit: Payer: Self-pay

## 2021-06-10 VITALS — BP 133/90 | HR 112 | Wt 183.4 lb

## 2021-06-10 DIAGNOSIS — E1140 Type 2 diabetes mellitus with diabetic neuropathy, unspecified: Secondary | ICD-10-CM

## 2021-06-10 DIAGNOSIS — R159 Full incontinence of feces: Secondary | ICD-10-CM | POA: Diagnosis not present

## 2021-06-10 DIAGNOSIS — I4891 Unspecified atrial fibrillation: Secondary | ICD-10-CM

## 2021-06-10 DIAGNOSIS — I10 Essential (primary) hypertension: Secondary | ICD-10-CM | POA: Diagnosis not present

## 2021-06-10 DIAGNOSIS — R634 Abnormal weight loss: Secondary | ICD-10-CM

## 2021-06-10 LAB — POCT GLYCOSYLATED HEMOGLOBIN (HGB A1C): HbA1c, POC (prediabetic range): 5.9 % (ref 5.7–6.4)

## 2021-06-10 LAB — POCT INR: INR: 1.6 — AB (ref 2.0–3.0)

## 2021-06-10 MED ORDER — RIVAROXABAN 20 MG PO TABS: 20.0000 mg | ORAL_TABLET | Freq: Every day | ORAL | 1 refills | Status: DC

## 2021-06-10 MED ORDER — GABAPENTIN 100 MG PO CAPS: 100.0000 mg | ORAL_CAPSULE | Freq: Every day | ORAL | 1 refills | Status: DC

## 2021-06-10 MED ORDER — LOPERAMIDE HCL 2 MG PO TABS: 2.0000 mg | ORAL_TABLET | Freq: Four times a day (QID) | ORAL | 3 refills | Status: DC | PRN

## 2021-06-10 NOTE — Patient Instructions (Addendum)
Good to see you today - Thank you for coming in  Things we discussed today:   Keep taking all the medications as he is   Continue taking the warfarin as we are dosing   Check his blood pressure and write down and bring in his readings   Please always bring your medication bottles  Come back to see me in 2-3 weeks when you get the xarelto but do NOT start it

## 2021-06-10 NOTE — Assessment & Plan Note (Signed)
Good A1c.  Low dose gabapentin seems to help with his leg discomfort.

## 2021-06-10 NOTE — Assessment & Plan Note (Signed)
Blood pressure is stable on beta blocker and lower dose of lasix - continue

## 2021-06-10 NOTE — Assessment & Plan Note (Signed)
Fluctuates but stable overall.

## 2021-06-10 NOTE — Assessment & Plan Note (Signed)
INR is too low today after too high last visit.  Had discuss with both his dtrs in person and via phone.  He could not tolerate apixaban in past due to falls.  No record of every taking rivaroxaban.  Given the better safety with DOACs compared to warfarin and his difficulty keeping INR in range and that cardiology ok'd DOAC for his value will plan to start rivaroxaban.  Rx sent and will bring in and check INR and then start next visit

## 2021-06-11 ENCOUNTER — Encounter: Payer: Self-pay | Admitting: Podiatry

## 2021-06-11 ENCOUNTER — Ambulatory Visit (INDEPENDENT_AMBULATORY_CARE_PROVIDER_SITE_OTHER): Payer: Medicare Other | Admitting: Podiatry

## 2021-06-11 ENCOUNTER — Ambulatory Visit: Payer: Medicare HMO | Admitting: Podiatry

## 2021-06-11 DIAGNOSIS — B351 Tinea unguium: Secondary | ICD-10-CM | POA: Diagnosis not present

## 2021-06-11 DIAGNOSIS — M79675 Pain in left toe(s): Secondary | ICD-10-CM | POA: Diagnosis not present

## 2021-06-11 DIAGNOSIS — M79674 Pain in right toe(s): Secondary | ICD-10-CM | POA: Diagnosis not present

## 2021-06-11 DIAGNOSIS — E1140 Type 2 diabetes mellitus with diabetic neuropathy, unspecified: Secondary | ICD-10-CM | POA: Diagnosis not present

## 2021-06-11 NOTE — Progress Notes (Signed)
°  Subjective:  Patient ID: Matthew Edwards, male    DOB: 13-Jan-1941,   MRN: [id number]  Chief Complaint  Patient presents with   debride    Pt is her for [facility]. Last A1c was checked yesterday 5.9.    81 y.o. male presents for diabetic foot care and painful elongated and thickened toenails. Relates he cannot trim them himself. Last A1c was 5.9 . Denies any other pedal complaints. Denies n/v/f/c.   PCP: Talbert Cage MD   Past Medical History:  Diagnosis Date   Arthritis    Atrial fibrillation ([place])    CAD (coronary artery disease) 2012   [place]-LAD 2 or 3 srents   Complication of anesthesia    "loopy after polpy removal dec 2017, lasted several days"   COPD (chronic obstructive pulmonary disease) ([place])    Dementia (HCC)    GERD (gastroesophageal reflux disease)    HEART FAILURE, CONGESTIVE UNSPEC 02/23/2007   Qualifier: Diagnosis of  By: Mellody Drown MD, [facility]     Hyperlipidemia    Hypertension    OSA (obstructive sleep apnea) 11/13/2014   S/P AVR    #25 mm Edwards pericardial valve Bioprosthetic. Dr Cyndia Bent.   Situational depression    "son passed 11/2013"   Sleep apnea    occ uses c pap does not know settings    Stented coronary artery 2013   TOBACCO DEPENDENCE 07/29/2006   Qualifier: History of  By: Carlena Sax  MD, [place]     Type II diabetes mellitus ([place])     Objective:  Physical Exam: Vascular: DP/PT pulses 2/4 bilateral. CFT <3 seconds. Normal hair growth on digits. No edema.  Skin. No lacerations or abrasions bilateral feet. Nails 1-5 are thickened discolored and elongated with subungual debris.   Musculoskeletal: MMT 5/5 bilateral lower extremities in DF, PF, Inversion and Eversion. Deceased ROM in DF of ankle joint.  Neurological: Sensation intact to light touch.   Assessment:   1. Type 2 diabetes mellitus with diabetic neuropathy, without long-term current use of insulin (HCC)   2. Pain due to onychomycosis of toenails of both feet      Plan:  Patient was  evaluated and treated and all questions answered. -Discussed and educated patient on diabetic foot care, especially with  regards to the vascular, neurological and musculoskeletal systems.  -Stressed the importance of good glycemic control and the detriment of not  controlling glucose levels in relation to the foot. -Discussed supportive shoes at all times and checking feet regularly.  -Mechanically debrided all nails 1-5 bilateral using sterile nail nipper and filed with dremel without incident  -Answered all patient questions -Patient to return  in 3 months for at risk foot care -Patient advised to call the office if any problems or questions arise in the meantime.   Lorenda Peck, DPM

## 2021-07-07 ENCOUNTER — Other Ambulatory Visit: Payer: Self-pay | Admitting: Family Medicine

## 2021-07-08 ENCOUNTER — Ambulatory Visit (INDEPENDENT_AMBULATORY_CARE_PROVIDER_SITE_OTHER): Payer: Medicare Other | Admitting: Family Medicine

## 2021-07-08 ENCOUNTER — Other Ambulatory Visit: Payer: Self-pay

## 2021-07-08 DIAGNOSIS — I4891 Unspecified atrial fibrillation: Secondary | ICD-10-CM | POA: Diagnosis not present

## 2021-07-08 LAB — POCT INR: INR: 2.9 (ref 2.0–3.0)

## 2021-07-08 MED ORDER — GABAPENTIN 100 MG PO CAPS: 100.0000 mg | ORAL_CAPSULE | Freq: Every day | ORAL | 1 refills | Status: DC

## 2021-07-08 NOTE — Assessment & Plan Note (Signed)
Plan to stop warfarin and start rivaroxaban as this is safer and less likely to cause bleeding especially given his erratic INR in past.  Today his INR is < 3 so will stop Warfarin and start Xarelto IF his daughter has at home.  If does not start it in 2 days she will call me.  See after visit summary for details

## 2021-07-08 NOTE — Progress Notes (Signed)
e [place]    SUBJECTIVE:   CHIEF COMPLAINT / HPI:    Atrial Fib and BioProsthetic Valve His INR is 2.9 today.  Last took warfarin yesterday.  Daughter is unsure the Xarelto bottle has arrived via Mail Order   Weight loss Fluctuates but stable overall.    OBJECTIVE:   BP 132/65    Pulse 64    Wt 184 lb 3.2 oz (83.6 kg)    SpO2 96%    BMI 27.20 kg/m   Appears calm and no distress  ASSESSMENT/PLAN:   Atrial fibrillation (HCC) Plan to stop warfarin and start rivaroxaban as this is safer and less likely to cause bleeding especially given his erratic INR in past.  Today his INR is < 3 so will stop Warfarin and start Xarelto IF his daughter has at home.  If does not start it in 2 days she will call me.  See after visit summary for details      Lind Covert, MD [place]

## 2021-07-08 NOTE — Patient Instructions (Signed)
Good to see you today - Thank you for coming in  Things we discussed today:  Check to see if the XARELTO (rivaroxaban) is here.  Start taking 20 mg once a day with his evening meal  STOP the WARFARIN  Call me if any questions   Your number [phone]   Please always bring your medication bottles  Come back to see me in 2-3 weeks

## 2021-07-11 ENCOUNTER — Telehealth: Payer: Self-pay | Admitting: Family Medicine

## 2021-07-11 NOTE — Telephone Encounter (Signed)
Spoke to Dr Has not gotten xarelto  Called 2/27 Left message to let us know if he has gotten his xarelto yet

## 2021-07-21 ENCOUNTER — Other Ambulatory Visit: Payer: Self-pay | Admitting: Family Medicine

## 2021-07-21 DIAGNOSIS — I10 Essential (primary) hypertension: Secondary | ICD-10-CM

## 2021-08-01 ENCOUNTER — Telehealth: Payer: Self-pay | Admitting: Family Medicine

## 2021-08-01 MED ORDER — RIVAROXABAN 20 MG PO TABS: 20.0000 mg | ORAL_TABLET | Freq: Every day | ORAL | 1 refills | Status: DC

## 2021-08-01 NOTE — Telephone Encounter (Signed)
Spoke with his daughter Reatha Armour ?He is doing well still taking warfarin ?Has not gotten xarelto yet ? ?Sent in new Rx ?Asked her to make him an appointment when the get the xarelto but NOT to start it until I see him and to continue the warfarin ? ?She agrees  ?

## 2021-08-07 IMAGING — CT CT CERVICAL SPINE WITHOUT CONTRAST
3 of 4 series · 12 of 33 positions shown, 14 images · non-contrast
Comparison: CT of the head dated 03/12/2017

CLINICAL DATA: 77-year-old male with fall. Patient was found on the
floor.

EXAM:
CT HEAD WITHOUT CONTRAST
CT CERVICAL SPINE WITHOUT CONTRAST
TECHNIQUE: Multidetector CT imaging of the head and cervical spine was
performed following the standard protocol without intravenous
contrast. Multiplanar CT image reconstructions of the cervical spine
were also generated.

[Series 6: c_spine 2.0 sag bone · sagittal · 0.23mm/px · 5 of 61 slices shown, 6 images]
[im 21/61  bone]
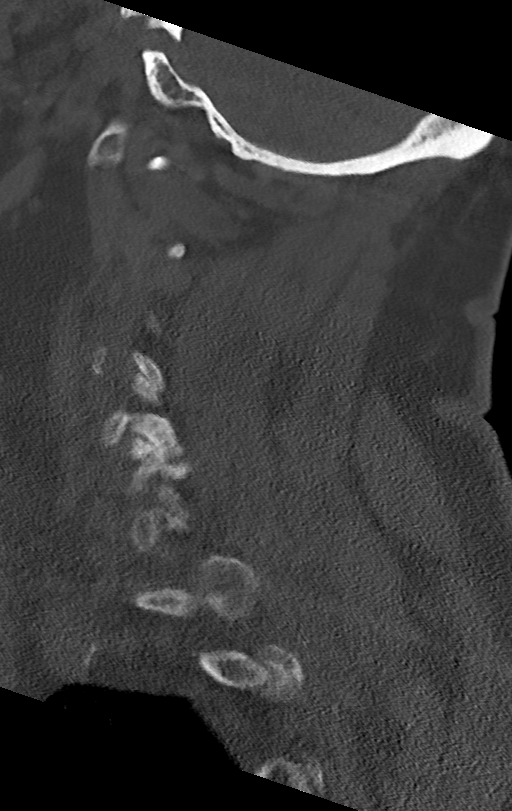
[im 26/61  bone]
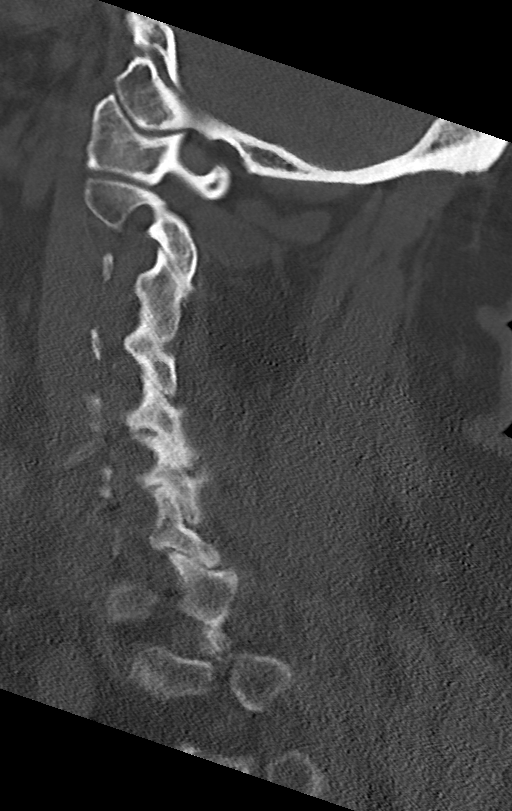
[im 31/61  soft-tissue]
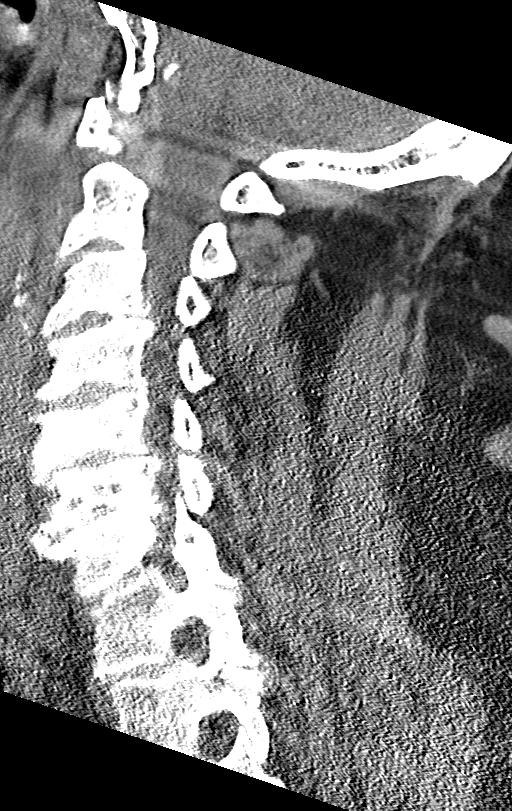
[im 31/61  bone]
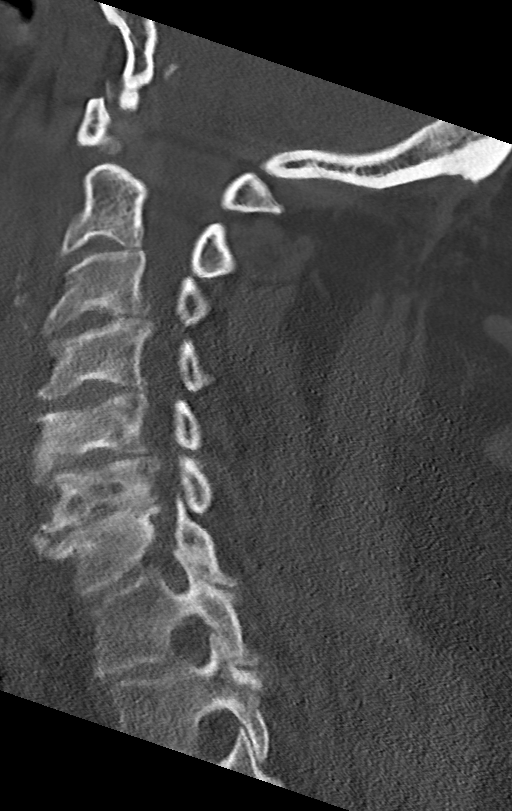
[im 36/61  bone]
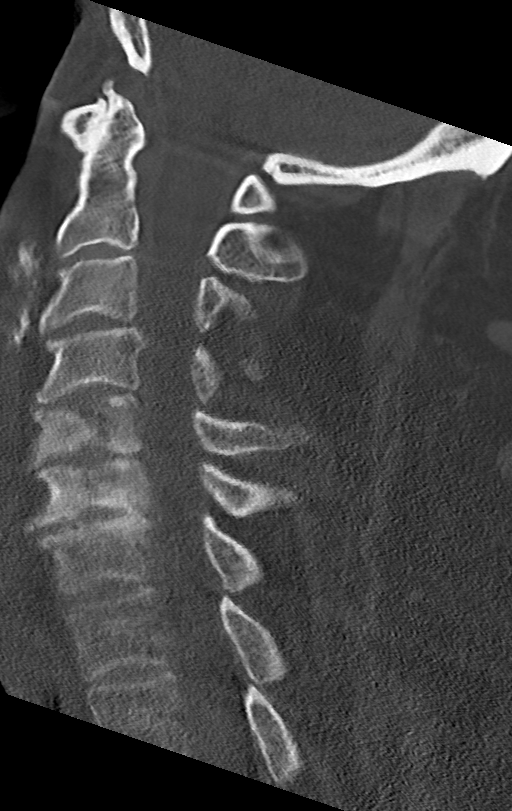
[im 41/61  bone]
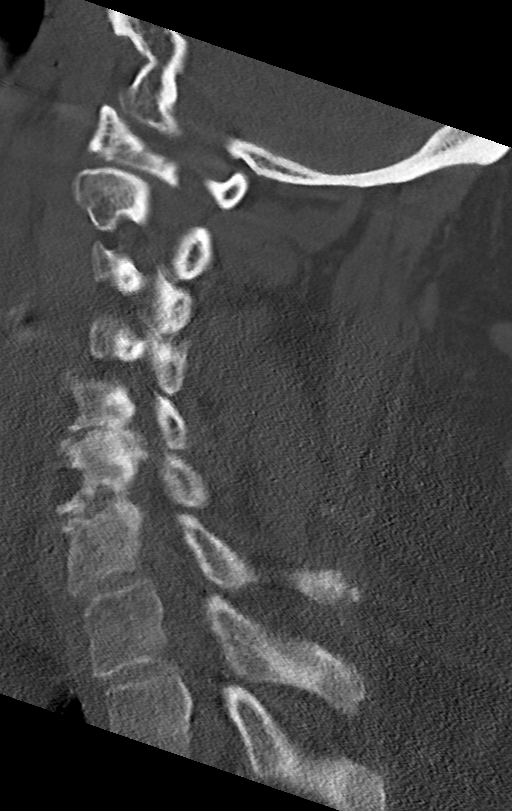

[Series 7: c_spine 2.0 cor bone · coronal · 0.23mm/px · 3 of 61 slices shown]
[im 13/61  bone]
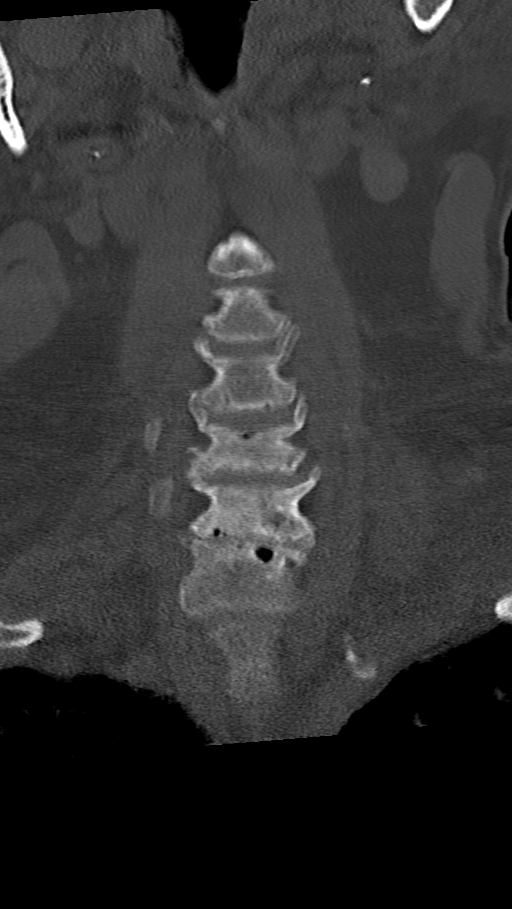
[im 25/61  bone]
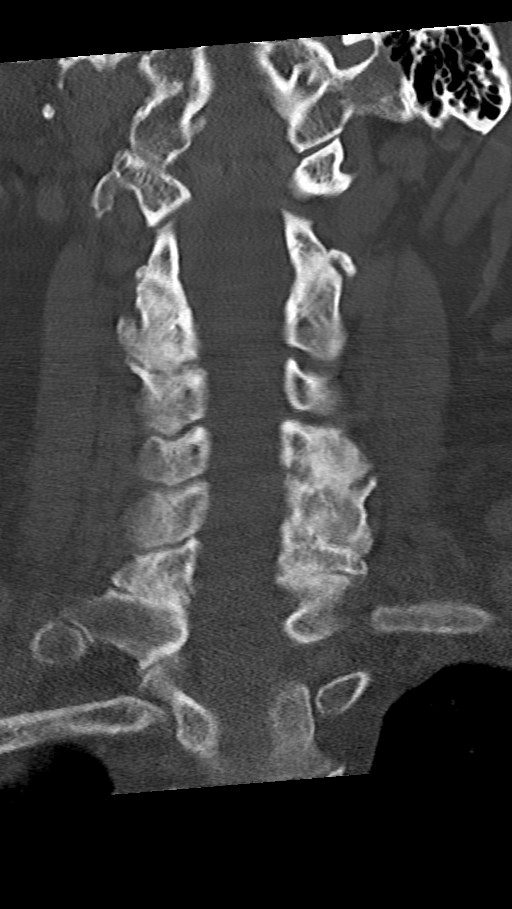
[im 37/61  bone]
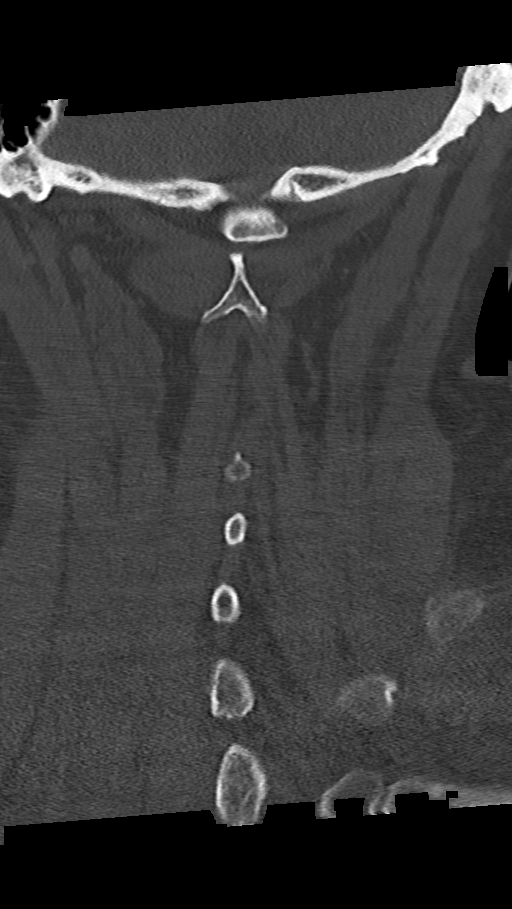

[Series 9: c_spine 1.0 st thins · axial · 0.34mm/px · z∈[+1048,+1153]mm · 4 of 226 slices shown, 5 images]
[im 51/226  soft-tissue]
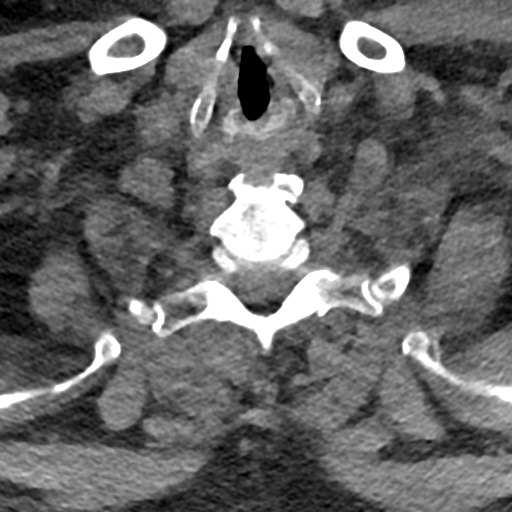
[im 51/226  bone]
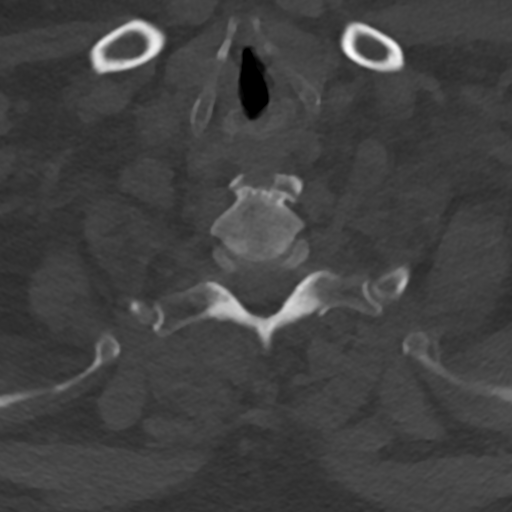
[im 101/226  bone]
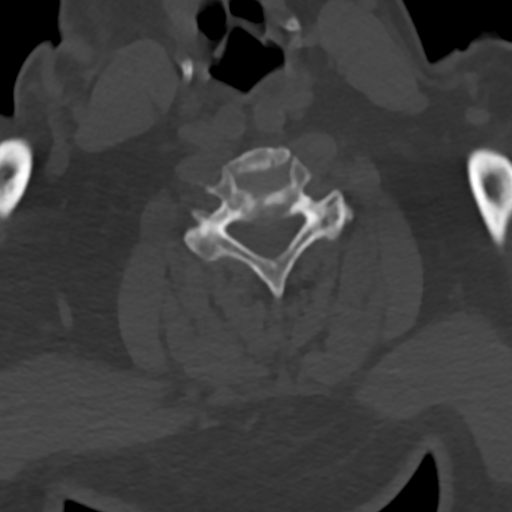
[im 151/226  bone]
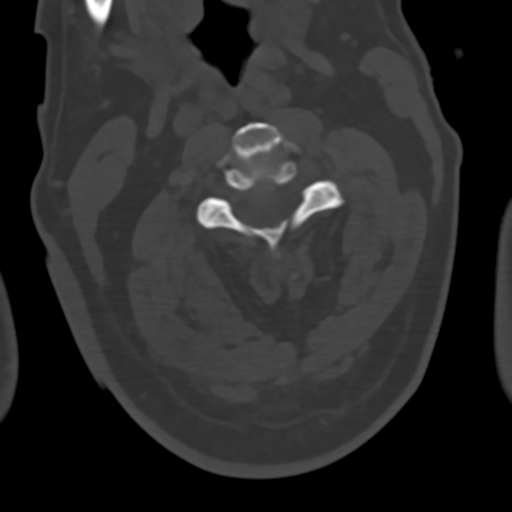
[im 201/226  bone]
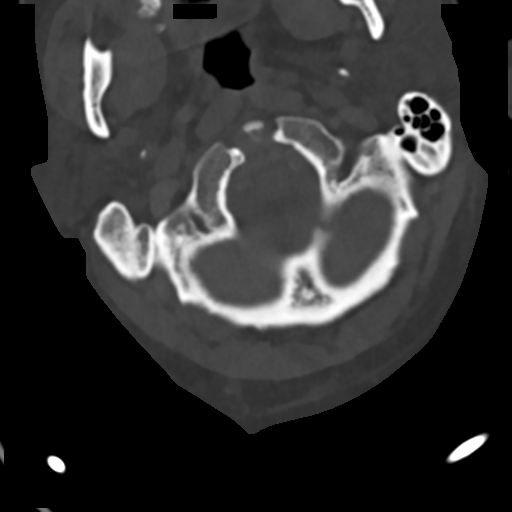

[12 of 33 positions shown; findings below may reference images not displayed]

FINDINGS: CT HEAD FINDINGS

Brain: There is moderate age-related atrophy and chronic
microvascular ischemic changes. There is no acute intracranial
hemorrhage. No mass effect or midline shift. No extra-axial fluid
collection.

Vascular: No hyperdense vessel or unexpected calcification.

Skull: Normal. Negative for fracture or focal lesion.

Sinuses/Orbits: No acute finding.

Other: None

CT CERVICAL SPINE FINDINGS

Alignment: No acute subluxation.

Skull base and vertebrae: No acute fracture. Osteopenia chronic
cystic degenerative changes primarily involving C5 and C6 vertebra.

Soft tissues and spinal canal: No prevertebral fluid or swelling. No
visible canal hematoma.

Disc levels: Extensive multilevel degenerative changes with disc
space narrowing and endplate irregularity most prominent at C4-C5,
C5-C6, and C6-C7.

Upper chest: Mild emphysema.

Other: Bilateral carotid bulb calcified plaques.
IMPRESSION: 1. No acute intracranial hemorrhage. Moderate age-related atrophy
and chronic microvascular ischemic changes.
2. No acute/traumatic cervical spine pathology. Extensive
degenerative changes.

## 2021-08-11 ENCOUNTER — Other Ambulatory Visit: Payer: Self-pay | Admitting: Family Medicine

## 2021-08-11 DIAGNOSIS — I10 Essential (primary) hypertension: Secondary | ICD-10-CM

## 2021-09-15 ENCOUNTER — Other Ambulatory Visit: Payer: Self-pay | Admitting: Family Medicine

## 2021-09-15 DIAGNOSIS — I4891 Unspecified atrial fibrillation: Secondary | ICD-10-CM

## 2021-09-17 ENCOUNTER — Ambulatory Visit (INDEPENDENT_AMBULATORY_CARE_PROVIDER_SITE_OTHER): Payer: Self-pay | Admitting: Podiatry

## 2021-09-17 DIAGNOSIS — Z91199 Patient's noncompliance with other medical treatment and regimen due to unspecified reason: Secondary | ICD-10-CM

## 2021-09-17 NOTE — Progress Notes (Signed)
   Complete physical exam  Patient: Matthew Edwards   DOB: 03/21/1997   81 y.o. Male  MRN: [id number]  Subjective:    No chief complaint on file.   Matthew Edwards is a 81 y.o. male who presents today for a complete physical exam. She reports consuming a {diet types:17450} diet. {types:19826} She generally feels {DESC; WELL/FAIRLY WELL/POORLY:18703}. She reports sleeping {DESC; WELL/FAIRLY WELL/POORLY:18703}. She {does/does not:200015} have additional problems to discuss today.    Most recent fall risk assessment:    11/26/2021   10:42 AM  Fall Risk   Falls in the past year? 0  Number falls in past yr: 0  Injury with Fall? 0  Risk for fall due to : No Fall Risks  Follow up Falls evaluation completed     Most recent depression screenings:    11/26/2021   10:42 AM 10/17/2020   10:46 AM  PHQ 2/9 Scores  PHQ - 2 Score 0 0  PHQ- 9 Score 5     {VISON DENTAL STD PSA (Optional):27386}  {History (Optional):23778}  Patient Care Team: Jessup, Joy, NP as PCP - General (Nurse Practitioner)   Outpatient Medications Prior to Visit  Medication Sig   fluticasone (FLONASE) 50 MCG/ACT nasal spray Place 2 sprays into both nostrils in the morning and at bedtime. After 7 days, reduce to once daily.   norgestimate-ethinyl estradiol (SPRINTEC 28) 0.25-35 MG-MCG tablet Take 1 tablet by mouth daily.   Nystatin POWD Apply liberally to affected area 2 times per day   spironolactone (ALDACTONE) 100 MG tablet Take 1 tablet (100 mg total) by mouth daily.   No facility-administered medications prior to visit.    ROS        Objective:     There were no vitals taken for this visit. {Vitals History (Optional):23777}  Physical Exam   No results found for any visits on 01/01/22. {Show previous labs (optional):23779}    Assessment & Plan:    Routine Health Maintenance and Physical Exam  Immunization History  Administered Date(s) Administered   DTaP 06/04/1999, 07/31/1999,  10/09/1999, 06/24/2000, 01/08/2004   Hepatitis A 11/04/2007, 11/09/2008   Hepatitis B 03/22/1999, 04/29/1999, 10/09/1999   HiB (PRP-OMP) 06/04/1999, 07/31/1999, 10/09/1999, 06/24/2000   IPV 06/04/1999, 07/31/1999, 03/29/2000, 01/08/2004   Influenza,inj,Quad PF,6+ Mos 02/09/2014   Influenza-Unspecified 05/11/2012   MMR 03/29/2001, 01/08/2004   Meningococcal Polysaccharide 11/09/2011   Pneumococcal Conjugate-13 06/24/2000   Pneumococcal-Unspecified 10/09/1999, 12/23/1999   Tdap 11/09/2011   Varicella 03/29/2000, 11/04/2007    Health Maintenance  Topic Date Due   HIV Screening  Never done   Hepatitis C Screening  Never done   INFLUENZA VACCINE  12/30/2021   PAP-Cervical Cytology Screening  01/01/2022 (Originally 03/20/2020)   PAP SMEAR-Modifier  01/01/2022 (Originally 03/20/2020)   TETANUS/TDAP  01/01/2022 (Originally 11/08/2021)   HPV VACCINES  Discontinued   COVID-19 Vaccine  Discontinued    Discussed health benefits of physical activity, and encouraged her to engage in regular exercise appropriate for her age and condition.  Problem List Items Addressed This Visit   None Visit Diagnoses     Annual physical exam    -  Primary   Cervical cancer screening       Need for Tdap vaccination          No follow-ups on file.     Joy Jessup, NP   

## 2021-11-04 ENCOUNTER — Encounter: Payer: Self-pay | Admitting: *Deleted

## 2021-12-30 ENCOUNTER — Emergency Department (HOSPITAL_COMMUNITY): Payer: Medicare Other

## 2021-12-30 ENCOUNTER — Emergency Department (HOSPITAL_COMMUNITY)
Admission: EM | Admit: 2021-12-30 | Discharge: 2021-12-30 | Disposition: A | Discharge status: Home / Self Care | Payer: Medicare Other | Attending: Emergency Medicine | Admitting: Emergency Medicine

## 2021-12-30 ENCOUNTER — Other Ambulatory Visit: Payer: Self-pay

## 2021-12-30 DIAGNOSIS — W1839XA Other fall on same level, initial encounter: Secondary | ICD-10-CM | POA: Insufficient documentation

## 2021-12-30 DIAGNOSIS — Z992 Dependence on renal dialysis: Secondary | ICD-10-CM | POA: Diagnosis not present

## 2021-12-30 DIAGNOSIS — Y92000 Kitchen of unspecified non-institutional (private) residence as  the place of occurrence of the external cause: Secondary | ICD-10-CM | POA: Insufficient documentation

## 2021-12-30 DIAGNOSIS — W19XXXA Unspecified fall, initial encounter: Secondary | ICD-10-CM

## 2021-12-30 DIAGNOSIS — Z79899 Other long term (current) drug therapy: Secondary | ICD-10-CM | POA: Insufficient documentation

## 2021-12-30 DIAGNOSIS — D696 Thrombocytopenia, unspecified: Secondary | ICD-10-CM | POA: Diagnosis not present

## 2021-12-30 DIAGNOSIS — S0990XA Unspecified injury of head, initial encounter: Secondary | ICD-10-CM | POA: Insufficient documentation

## 2021-12-30 DIAGNOSIS — S12000A Unspecified displaced fracture of first cervical vertebra, initial encounter for closed fracture: Secondary | ICD-10-CM

## 2021-12-30 DIAGNOSIS — S199XXA Unspecified injury of neck, initial encounter: Secondary | ICD-10-CM | POA: Diagnosis present

## 2021-12-30 DIAGNOSIS — S12090A Other displaced fracture of first cervical vertebra, initial encounter for closed fracture: Secondary | ICD-10-CM | POA: Diagnosis not present

## 2021-12-30 DIAGNOSIS — F0390 Unspecified dementia without behavioral disturbance: Secondary | ICD-10-CM | POA: Insufficient documentation

## 2021-12-30 DIAGNOSIS — Z7901 Long term (current) use of anticoagulants: Secondary | ICD-10-CM | POA: Diagnosis not present

## 2021-12-30 LAB — CBC WITH DIFFERENTIAL/PLATELET
Abs Immature Granulocytes: 0.01 10*3/uL (ref 0.00–0.07)
Basophils Absolute: 0 10*3/uL (ref 0.0–0.1)
Basophils Relative: 0 %
Eosinophils Absolute: 0.1 10*3/uL (ref 0.0–0.5)
Eosinophils Relative: 1 %
HCT: 45.7 % (ref 39.0–52.0)
Hemoglobin: 14.6 g/dL (ref 13.0–17.0)
Immature Granulocytes: 0 %
Lymphocytes Relative: 23 %
Lymphs Abs: 1.7 10*3/uL (ref 0.7–4.0)
MCH: 29.7 pg (ref 26.0–34.0)
MCHC: 31.9 g/dL (ref 30.0–36.0)
MCV: 92.9 fL (ref 80.0–100.0)
Monocytes Absolute: 0.6 10*3/uL (ref 0.1–1.0)
Monocytes Relative: 8 %
Neutro Abs: 4.9 10*3/uL (ref 1.7–7.7)
Neutrophils Relative %: 68 %
Platelets: 126 10*3/uL — ABNORMAL LOW (ref 150–400)
RBC: 4.92 MIL/uL (ref 4.22–5.81)
RDW: 14.3 % (ref 11.5–15.5)
WBC: 7.2 10*3/uL (ref 4.0–10.5)
nRBC: 0 % (ref 0.0–0.2)

## 2021-12-30 LAB — PROTIME-INR
INR: 2.7 — ABNORMAL HIGH (ref 0.8–1.2)
Prothrombin Time: 28.5 seconds — ABNORMAL HIGH (ref 11.4–15.2)

## 2021-12-30 LAB — BASIC METABOLIC PANEL
Anion gap: 7 (ref 5–15)
BUN: 12 mg/dL (ref 8–23)
CO2: 24 mmol/L (ref 22–32)
Calcium: 10.8 mg/dL — ABNORMAL HIGH (ref 8.9–10.3)
Chloride: 107 mmol/L (ref 98–111)
Creatinine, Ser: 0.99 mg/dL (ref 0.61–1.24)
GFR, Estimated: >60 mL/min (ref >60)
Glucose, Bld: 84 mg/dL (ref 70–99)
Potassium: 4 mmol/L (ref 3.5–5.1)
Sodium: 138 mmol/L (ref 135–145)

## 2021-12-30 NOTE — ED Triage Notes (Signed)
Patient coming to ED for evaluation of head injury.  Reports having unwitnessed fall today in kitchen.  Unable if he lost his balance or had syncopal episode.  C/o pain to head and neck.  Currently on blood thinners.

## 2021-12-30 NOTE — Discharge Instructions (Addendum)
You were seen in the emergency department for evaluation of injuries from a fall.  You had a CAT scan of your head and cervical spine.  Your head CT was negative but your cervical spine CT showed a fracture of the C1 vertebra.  Neurosurgery is recommending a hard collar at all times and following up with them in 2 weeks for repeat imaging.  Return to the emergency department if any worsening or concerning symptoms.

## 2021-12-30 NOTE — ED Provider Notes (Signed)
[facility] DEPT Provider Note   CSN: [id number] Arrival date & time: 12/30/21  2032     History {Add pertinent medical, surgical, social history, OB history to HPI:1} Chief Complaint  Patient presents with   Head Injury   Byrnes Mill is a 81 y.o. male.  Level 5 caveat secondary to dementia.  Patient brought in by his daughter from home after an unwitnessed fall earlier today.  She thinks he might of fallen while she was at dialysis.  She noticed a bump on his head and some blood dried near his nose.  He is complaining of some neck pain when he moves.  She said he is ambulated since the fall.  He is on Coumadin for A-fib.  She feels he is otherwise at his baseline.  The history is provided by the patient and a relative.  Head Injury Location:  Frontal Mechanism of injury: fall   Fall:    Fall occurred:  Standing Pain details:    Quality:  Unable to specify   Severity:  Unable to specify Chronicity:  New Relieved by:  None tried Worsened by:  Nothing Ineffective treatments:  None tried Associated symptoms: neck pain   Associated symptoms: no focal weakness, no headaches, no nausea and no vomiting   Fall Pertinent negatives include no headaches.       Home Medications Prior to Admission medications   Medication Sig Start Date End Date Taking? Authorizing Provider  atorvastatin (LIPITOR) 40 MG tablet TAKE 1 TABLET BY MOUTH  DAILY AT 6:00 PM 05/20/21   Chambliss, Jeb Levering, MD  budesonide-formoterol (SYMBICORT) 80-4.5 MCG/ACT inhaler Inhale 2 puffs into the lungs 2 (two) times daily. 02/07/21   Lind Covert, MD  donepezil (ARICEPT) 5 MG tablet TAKE 1 TABLET BY MOUTH AT  BEDTIME 07/07/21   Lind Covert, MD  furosemide (LASIX) 20 MG tablet TAKE 1 TABLET BY MOUTH DAILY 08/11/21   Lind Covert, MD  gabapentin (NEURONTIN) 100 MG capsule TAKE 1 CAPSULE BY MOUTH AT  BEDTIME 09/15/21   Lind Covert, MD   loperamide (IMODIUM A-D) 2 MG tablet Take 1 tablet (2 mg total) by mouth 4 (four) times daily as needed for diarrhea or loose stools. 06/10/21   Lind Covert, MD  metoprolol succinate (TOPROL-XL) 100 MG 24 hr tablet TAKE 1 TABLET BY MOUTH DAILY 07/21/21   Lind Covert, MD  potassium chloride SA (KLOR-CON) 20 MEQ tablet Take 1 tablet (20 mEq total) by mouth daily. 04/08/21   Lind Covert, MD  rivaroxaban (XARELTO) 20 MG TABS tablet Take 1 tablet (20 mg total) by mouth daily with supper. With a meal 08/01/21   Lind Covert, MD  tamsulosin (FLOMAX) 0.4 MG CAPS capsule Take 1 capsule (0.4 mg total) by mouth daily. 04/08/21   Lind Covert, MD      Allergies    Pineapple concentrate    Review of Systems   Review of Systems  Unable to perform ROS: Dementia  Gastrointestinal:  Negative for nausea and vomiting.  Musculoskeletal:  Positive for neck pain.  Neurological:  Negative for focal weakness and headaches.    Physical Exam Updated Vital Signs BP 120/88 (BP Location: Left Arm)   Pulse 81   Temp 98.2 F (36.8 C) (Oral)   Resp 18   Ht '5\' 9"'$  (1.753 m)   Wt 73.9 kg   SpO2 98%   BMI 24.07 kg/m  Physical Exam  Vitals and nursing note reviewed.  Constitutional:      General: He is not in acute distress.    Appearance: Normal appearance. He is well-developed.  HENT:     Head: Normocephalic and atraumatic.  Eyes:     Conjunctiva/sclera: Conjunctivae normal.  Cardiovascular:     Rate and Rhythm: Normal rate and regular rhythm.     Heart sounds: No murmur heard. Pulmonary:     Effort: Pulmonary effort is normal. No respiratory distress.     Breath sounds: Normal breath sounds.  Abdominal:     Palpations: Abdomen is soft.     Tenderness: There is no abdominal tenderness. There is no guarding or rebound.  Musculoskeletal:        General: Normal range of motion.     Cervical back: Neck supple. No tenderness.  Skin:    General: Skin is warm and  dry.     Capillary Refill: Capillary refill takes less than 2 seconds.  Neurological:     General: No focal deficit present.     Mental Status: He is alert. He is disoriented.     Motor: No weakness.     ED Results / Procedures / Treatments   Labs (all labs ordered are listed, but only abnormal results are displayed) Labs Reviewed  BASIC METABOLIC PANEL  CBC WITH DIFFERENTIAL/PLATELET  PROTIME-INR    EKG None  Radiology No results found.  Procedures Procedures  {Document cardiac monitor, telemetry assessment procedure when appropriate:1}  Medications Ordered in ED Medications - No data to display  ED Course/ Medical Decision Making/ A&P                           Medical Decision Making Amount and/or Complexity of Data Reviewed Labs: ordered. Radiology: ordered.  This patient complains of ***; this involves an extensive number of treatment Options and is a complaint that carries with it a high risk of complications and morbidity. The differential includes ***  I ordered, reviewed and interpreted labs, which included *** I ordered medication *** and reviewed PMP when indicated. I ordered imaging studies which included *** and I independently    visualized and interpreted imaging which showed *** Additional history obtained from *** Previous records obtained and reviewed *** I consulted *** and discussed lab and imaging findings and discussed disposition.  Cardiac monitoring reviewed, *** Social determinants considered, *** Critical Interventions: ***  After the interventions stated above, I reevaluated the patient and found *** Admission and further testing considered, ***    {Document critical care time when appropriate:1} {Document review of labs and clinical decision tools ie heart score, Chads2Vasc2 etc:1}  {Document your independent review of radiology images, and any outside records:1} {Document your discussion with family members, caretakers, and  with consultants:1} {Document social determinants of health affecting pt's care:1} {Document your decision making why or why not admission, treatments were needed:1} Final Clinical Impression(s) / ED Diagnoses Final diagnoses:  None    Rx / DC Orders ED Discharge Orders     None

## 2022-03-08 ENCOUNTER — Encounter (HOSPITAL_COMMUNITY): Payer: Self-pay

## 2022-03-08 ENCOUNTER — Emergency Department (HOSPITAL_COMMUNITY)
Admission: EM | Admit: 2022-03-08 | Discharge: 2022-03-09 | Disposition: A | Discharge status: Home / Self Care | Payer: Medicare Other | Attending: Emergency Medicine | Admitting: Emergency Medicine

## 2022-03-08 DIAGNOSIS — W19XXXA Unspecified fall, initial encounter: Secondary | ICD-10-CM | POA: Diagnosis not present

## 2022-03-08 DIAGNOSIS — I509 Heart failure, unspecified: Secondary | ICD-10-CM | POA: Insufficient documentation

## 2022-03-08 DIAGNOSIS — S0990XA Unspecified injury of head, initial encounter: Secondary | ICD-10-CM | POA: Diagnosis present

## 2022-03-08 DIAGNOSIS — J449 Chronic obstructive pulmonary disease, unspecified: Secondary | ICD-10-CM | POA: Insufficient documentation

## 2022-03-08 DIAGNOSIS — Z79899 Other long term (current) drug therapy: Secondary | ICD-10-CM | POA: Insufficient documentation

## 2022-03-08 DIAGNOSIS — F0390 Unspecified dementia without behavioral disturbance: Secondary | ICD-10-CM | POA: Diagnosis not present

## 2022-03-08 DIAGNOSIS — I110 Hypertensive heart disease with heart failure: Secondary | ICD-10-CM | POA: Diagnosis not present

## 2022-03-08 DIAGNOSIS — R531 Weakness: Secondary | ICD-10-CM | POA: Diagnosis not present

## 2022-03-08 NOTE — ED Triage Notes (Signed)
Patient arrived with family who states patient had an unwitnessed fall tonight. States he was on the other side of the bed without his walker. Patient has no complaints at this time, confused at baseline per family. Family states he may need to be placed in a facility.

## 2022-03-08 NOTE — ED Provider Notes (Signed)
[facility] DEPT Provider Note   CSN: [id number] Arrival date & time: 03/08/22  2301     History  Chief Complaint  Patient presents with   Matthew Edwards is a 81 y.o. male.  Patient is a 81 year old male with past medical history of dementia, hypertension, COPD, diabetes, CHF.  Patient presenting here for evaluation of weakness and fall.  According to the daughter present at bedside, she put him to bed at about 9 PM, then went to check on him and he was lying on the floor.  Patient does not know how he got there, but denies any pain.  He had recent fall with head injury and C1 fracture requiring collar, but this has since healed.  He denies any weakness or numbness.  He denies any headache.  Patient does take Coumadin.  The history is provided by the patient.       Home Medications Prior to Admission medications   Medication Sig Start Date End Date Taking? Authorizing Provider  atorvastatin (LIPITOR) 40 MG tablet TAKE 1 TABLET BY MOUTH  DAILY AT 6:00 PM Patient taking differently: Take 40 mg by mouth daily. 05/20/21   Lind Covert, MD  budesonide-formoterol (SYMBICORT) 80-4.5 MCG/ACT inhaler Inhale 2 puffs into the lungs 2 (two) times daily. 02/07/21   Chambliss, Jeb Levering, MD  donepezil (ARICEPT) 5 MG tablet TAKE 1 TABLET BY MOUTH AT  BEDTIME Patient taking differently: Take 5 mg by mouth at bedtime. 07/07/21   Lind Covert, MD  furosemide (LASIX) 20 MG tablet TAKE 1 TABLET BY MOUTH DAILY Patient taking differently: Take 20 mg by mouth daily. 08/11/21   Lind Covert, MD  gabapentin (NEURONTIN) 100 MG capsule TAKE 1 CAPSULE BY MOUTH AT  BEDTIME Patient taking differently: Take 100 mg by mouth at bedtime. 09/15/21   Lind Covert, MD  loperamide (IMODIUM A-D) 2 MG tablet Take 1 tablet (2 mg total) by mouth 4 (four) times daily as needed for diarrhea or loose stools. 06/10/21   Lind Covert, MD  metoprolol  succinate (TOPROL-XL) 100 MG 24 hr tablet TAKE 1 TABLET BY MOUTH DAILY Patient taking differently: Take 100 mg by mouth daily. 07/21/21   Lind Covert, MD  potassium chloride SA (KLOR-CON) 20 MEQ tablet Take 1 tablet (20 mEq total) by mouth daily. 04/08/21   Lind Covert, MD  rivaroxaban (XARELTO) 20 MG TABS tablet Take 1 tablet (20 mg total) by mouth daily with supper. With a meal Patient not taking: Reported on 12/30/2021 08/01/21   Lind Covert, MD  tamsulosin (FLOMAX) 0.4 MG CAPS capsule Take 1 capsule (0.4 mg total) by mouth daily. 04/08/21   Lind Covert, MD  tiZANidine (ZANAFLEX) 2 MG tablet Take 2 mg by mouth at bedtime as needed for muscle spasms. 12/04/21   [provider]  traZODone (DESYREL) 50 MG tablet Take 50 mg by mouth at bedtime. 02/24/22   [provider]  VITAMIN D PO Take 1 tablet by mouth daily.    [provider]  warfarin (COUMADIN) 5 MG tablet Take 7.5 mg by mouth daily.    [provider]      Allergies    Pineapple concentrate    Review of Systems   Review of Systems  All other systems reviewed and are negative.   Physical Exam Updated Vital Signs BP 136/66   Pulse 62   Temp 99.2 F (37.3 C) (Oral)  Resp 18   SpO2 98%  Physical Exam Vitals and nursing note reviewed.  Constitutional:      General: He is not in acute distress.    Appearance: He is well-developed. He is not diaphoretic.  HENT:     Head: Normocephalic and atraumatic.  Eyes:     Extraocular Movements: Extraocular movements intact.     Pupils: Pupils are equal, round, and reactive to light.  Neck:     Comments: There is no cervical spine tenderness or step-off. Cardiovascular:     Rate and Rhythm: Normal rate and regular rhythm.     Heart sounds: No murmur heard.    No friction rub.  Pulmonary:     Effort: Pulmonary effort is normal. No respiratory distress.     Breath sounds: Normal breath sounds. No wheezing or rales.   Abdominal:     General: Bowel sounds are normal. There is no distension.     Palpations: Abdomen is soft.     Tenderness: There is no abdominal tenderness.  Musculoskeletal:        General: Normal range of motion.     Cervical back: Normal range of motion and neck supple.  Skin:    General: Skin is warm and dry.  Neurological:     General: No focal deficit present.     Mental Status: He is alert and oriented to person, place, and time.     Cranial Nerves: No cranial nerve deficit.     Motor: No weakness.     Coordination: Coordination normal.     ED Results / Procedures / Treatments   Labs (all labs ordered are listed, but only abnormal results are displayed) Labs Reviewed  CBC WITH DIFFERENTIAL/PLATELET  PROTIME-INR  BASIC METABOLIC PANEL  TROPONIN I (HIGH SENSITIVITY)    EKG None  Radiology No results found.  Procedures Procedures  {Document cardiac monitor, telemetry assessment procedure when appropriate:1}  Medications Ordered in ED Medications - No data to display  ED Course/ Medical Decision Making/ A&P                           Medical Decision Making Amount and/or Complexity of Data Reviewed Labs: ordered. Radiology: ordered.   ***  {Document critical care time when appropriate:1} {Document review of labs and clinical decision tools ie heart score, Chads2Vasc2 etc:1}  {Document your independent review of radiology images, and any outside records:1} {Document your discussion with family members, caretakers, and with consultants:1} {Document social determinants of health affecting pt's care:1} {Document your decision making why or why not admission, treatments were needed:1} Final Clinical Impression(s) / ED Diagnoses Final diagnoses:  None    Rx / DC Orders ED Discharge Orders     None

## 2022-03-09 ENCOUNTER — Emergency Department (HOSPITAL_COMMUNITY): Payer: Medicare Other

## 2022-03-09 ENCOUNTER — Other Ambulatory Visit: Payer: Self-pay

## 2022-03-09 ENCOUNTER — Encounter (HOSPITAL_COMMUNITY): Payer: Self-pay | Admitting: Emergency Medicine

## 2022-03-09 ENCOUNTER — Emergency Department (HOSPITAL_COMMUNITY)
Admission: EM | Admit: 2022-03-09 | Discharge: 2022-03-10 | Disposition: A | Discharge status: Home / Self Care | Payer: Medicare Other | Source: Home / Self Care | Attending: Emergency Medicine | Admitting: Emergency Medicine

## 2022-03-09 DIAGNOSIS — W06XXXA Fall from bed, initial encounter: Secondary | ICD-10-CM | POA: Insufficient documentation

## 2022-03-09 DIAGNOSIS — Z79899 Other long term (current) drug therapy: Secondary | ICD-10-CM | POA: Insufficient documentation

## 2022-03-09 DIAGNOSIS — S0990XA Unspecified injury of head, initial encounter: Secondary | ICD-10-CM | POA: Insufficient documentation

## 2022-03-09 DIAGNOSIS — R296 Repeated falls: Secondary | ICD-10-CM

## 2022-03-09 LAB — COMPREHENSIVE METABOLIC PANEL
ALT: 20 U/L (ref 0–44)
AST: 32 U/L (ref 15–41)
Albumin: 3.6 g/dL (ref 3.5–5.0)
Alkaline Phosphatase: 60 U/L (ref 38–126)
Anion gap: 6 (ref 5–15)
BUN: 9 mg/dL (ref 8–23)
CO2: 29 mmol/L (ref 22–32)
Calcium: 10.1 mg/dL (ref 8.9–10.3)
Chloride: 105 mmol/L (ref 98–111)
Creatinine, Ser: 1.08 mg/dL (ref 0.61–1.24)
GFR, Estimated: >60 mL/min (ref >60)
Glucose, Bld: 102 mg/dL — ABNORMAL HIGH (ref 70–99)
Potassium: 3.8 mmol/L (ref 3.5–5.1)
Sodium: 140 mmol/L (ref 135–145)
Total Bilirubin: 1.4 mg/dL — ABNORMAL HIGH (ref 0.3–1.2)
Total Protein: 6.4 g/dL — ABNORMAL LOW (ref 6.5–8.1)

## 2022-03-09 LAB — CBC WITH DIFFERENTIAL/PLATELET
Abs Immature Granulocytes: 0.01 10*3/uL (ref 0.00–0.07)
Abs Immature Granulocytes: 0.01 10*3/uL (ref 0.00–0.07)
Basophils Absolute: 0 10*3/uL (ref 0.0–0.1)
Basophils Absolute: 0 10*3/uL (ref 0.0–0.1)
Basophils Relative: 0 %
Basophils Relative: 1 %
Eosinophils Absolute: 0 10*3/uL (ref 0.0–0.5)
Eosinophils Absolute: 0 10*3/uL (ref 0.0–0.5)
Eosinophils Relative: 0 %
Eosinophils Relative: 0 %
HCT: 42.4 % (ref 39.0–52.0)
HCT: 44.3 % (ref 39.0–52.0)
Hemoglobin: 13.9 g/dL (ref 13.0–17.0)
Hemoglobin: 14.8 g/dL (ref 13.0–17.0)
Immature Granulocytes: 0 %
Immature Granulocytes: 0 %
Lymphocytes Relative: 11 %
Lymphocytes Relative: 26 %
Lymphs Abs: 0.5 10*3/uL — ABNORMAL LOW (ref 0.7–4.0)
Lymphs Abs: 1.3 10*3/uL (ref 0.7–4.0)
MCH: 30.9 pg (ref 26.0–34.0)
MCH: 31.3 pg (ref 26.0–34.0)
MCHC: 32.8 g/dL (ref 30.0–36.0)
MCHC: 33.4 g/dL (ref 30.0–36.0)
MCV: 93.7 fL (ref 80.0–100.0)
MCV: 94.2 fL (ref 80.0–100.0)
Monocytes Absolute: 0.5 10*3/uL (ref 0.1–1.0)
Monocytes Absolute: 0.6 10*3/uL (ref 0.1–1.0)
Monocytes Relative: 12 %
Monocytes Relative: 12 %
Neutro Abs: 3.1 10*3/uL (ref 1.7–7.7)
Neutro Abs: 3.3 10*3/uL (ref 1.7–7.7)
Neutrophils Relative %: 62 %
Neutrophils Relative %: 76 %
Platelets: 101 10*3/uL — ABNORMAL LOW (ref 150–400)
Platelets: 88 10*3/uL — ABNORMAL LOW (ref 150–400)
RBC: 4.5 MIL/uL (ref 4.22–5.81)
RBC: 4.73 MIL/uL (ref 4.22–5.81)
RDW: 13.9 % (ref 11.5–15.5)
RDW: 14 % (ref 11.5–15.5)
WBC: 4.4 10*3/uL (ref 4.0–10.5)
WBC: 5.1 10*3/uL (ref 4.0–10.5)
nRBC: 0 % (ref 0.0–0.2)
nRBC: 0 % (ref 0.0–0.2)

## 2022-03-09 LAB — BASIC METABOLIC PANEL
Anion gap: 5 (ref 5–15)
BUN: 11 mg/dL (ref 8–23)
CO2: 25 mmol/L (ref 22–32)
Calcium: 10 mg/dL (ref 8.9–10.3)
Chloride: 107 mmol/L (ref 98–111)
Creatinine, Ser: 1.03 mg/dL (ref 0.61–1.24)
GFR, Estimated: >60 mL/min (ref >60)
Glucose, Bld: 105 mg/dL — ABNORMAL HIGH (ref 70–99)
Potassium: 3.8 mmol/L (ref 3.5–5.1)
Sodium: 137 mmol/L (ref 135–145)

## 2022-03-09 LAB — TROPONIN I (HIGH SENSITIVITY)
Troponin I (High Sensitivity): 28 ng/L — ABNORMAL HIGH (ref <18)
Troponin I (High Sensitivity): 30 ng/L — ABNORMAL HIGH (ref <18)

## 2022-03-09 LAB — URINALYSIS, ROUTINE W REFLEX MICROSCOPIC
Bilirubin Urine: NEGATIVE
Glucose, UA: NEGATIVE mg/dL
Hgb urine dipstick: NEGATIVE
Ketones, ur: NEGATIVE mg/dL
Leukocytes,Ua: NEGATIVE
Nitrite: NEGATIVE
Protein, ur: NEGATIVE mg/dL
Specific Gravity, Urine: 1.017 (ref 1.005–1.030)
pH: 5 (ref 5.0–8.0)

## 2022-03-09 LAB — PROTIME-INR
INR: 3.1 — ABNORMAL HIGH (ref 0.8–1.2)
Prothrombin Time: 32.1 seconds — ABNORMAL HIGH (ref 11.4–15.2)

## 2022-03-09 NOTE — Discharge Instructions (Signed)
Follow-up with your social services representative tomorrow to discuss your situation.  Continue previously prescribed medications as before.  Return to the emergency department if symptoms significantly worsen or change.

## 2022-03-09 NOTE — ED Triage Notes (Signed)
Per patient daughter, the pt was standing up at the side of the bed and slid down to the floor, lying on the floor on his back, denies that he hit his head or LOC. Last coumadin yesterday. Has had frequent falls, seen at [facility] yesterday for a fall. Hx of dementia.

## 2022-03-09 NOTE — ED Provider Notes (Cosign Needed Addendum)
[facility] EMERGENCY DEPARTMENT Provider Note   CSN: [id number] Arrival date & time: 03/09/22  2008     History  Chief Complaint  Patient presents with   Matthew Edwards is a 81 y.o. male.  Patient's daughter reports patient has fallen twice today.  She reports he slid off of sofa while trying to put his pants on the first fall patient states the second fall patient had gone to the refrigerator and slipped and fell striking his head.  Patient did not lose consciousness.  I spoke with a another daughter on the phone who request that patient have laboratory evaluation and a urine and has in the past when he has had frequent falls he has had urinary tract infections.  Patient has also had issues with electrolyte abnormality These the patient currently denies any complaints he denies any area of pain  The history is provided by the patient. No language interpreter was used.  Fall This is a new problem. The current episode started 1 to 2 hours ago. The problem has not changed since onset.Nothing aggravates the symptoms. Nothing relieves the symptoms. He has tried nothing for the symptoms.       Home Medications Prior to Admission medications   Medication Sig Start Date End Date Taking? Authorizing Provider  atorvastatin (LIPITOR) 40 MG tablet TAKE 1 TABLET BY MOUTH  DAILY AT 6:00 PM Patient taking differently: Take 40 mg by mouth daily. 05/20/21   Lind Covert, MD  budesonide-formoterol (SYMBICORT) 80-4.5 MCG/ACT inhaler Inhale 2 puffs into the lungs 2 (two) times daily. 02/07/21   Chambliss, Jeb Levering, MD  donepezil (ARICEPT) 5 MG tablet TAKE 1 TABLET BY MOUTH AT  BEDTIME Patient taking differently: Take 5 mg by mouth at bedtime. 07/07/21   Lind Covert, MD  furosemide (LASIX) 20 MG tablet TAKE 1 TABLET BY MOUTH DAILY Patient taking differently: Take 20 mg by mouth daily. 08/11/21   Lind Covert, MD  gabapentin (NEURONTIN) 100 MG  capsule TAKE 1 CAPSULE BY MOUTH AT  BEDTIME Patient taking differently: Take 100 mg by mouth at bedtime. 09/15/21   Lind Covert, MD  loperamide (IMODIUM A-D) 2 MG tablet Take 1 tablet (2 mg total) by mouth 4 (four) times daily as needed for diarrhea or loose stools. 06/10/21   Lind Covert, MD  metoprolol succinate (TOPROL-XL) 100 MG 24 hr tablet TAKE 1 TABLET BY MOUTH DAILY Patient taking differently: Take 100 mg by mouth daily. 07/21/21   Lind Covert, MD  potassium chloride SA (KLOR-CON) 20 MEQ tablet Take 1 tablet (20 mEq total) by mouth daily. 04/08/21   Lind Covert, MD  rivaroxaban (XARELTO) 20 MG TABS tablet Take 1 tablet (20 mg total) by mouth daily with supper. With a meal Patient not taking: Reported on 12/30/2021 08/01/21   Lind Covert, MD  tamsulosin (FLOMAX) 0.4 MG CAPS capsule Take 1 capsule (0.4 mg total) by mouth daily. 04/08/21   Lind Covert, MD  tiZANidine (ZANAFLEX) 2 MG tablet Take 2 mg by mouth at bedtime as needed for muscle spasms. 12/04/21   [provider]  traZODone (DESYREL) 50 MG tablet Take 50 mg by mouth at bedtime. 02/24/22   [provider]  VITAMIN D PO Take 1 tablet by mouth daily.    [provider]  warfarin (COUMADIN) 5 MG tablet Take 5 mg by mouth daily.    [provider]  Allergies    Pineapple concentrate    Review of Systems   Review of Systems  All other systems reviewed and are negative.   Physical Exam Updated Vital Signs BP (!) 160/70 (BP Location: Left Arm)   Pulse 74   Temp 98.9 F (37.2 C) (Oral)   Resp 17   Ht '5\' 9"'$  (1.753 m)   Wt 73.9 kg   SpO2 96%   BMI 24.06 kg/m  Physical Exam Vitals and nursing note reviewed.  Constitutional:      Appearance: He is well-developed.  HENT:     Head: Normocephalic and atraumatic.     Nose: Nose normal.     Mouth/Throat:     Mouth: Mucous membranes are moist.  Eyes:     Extraocular Movements: Extraocular  movements intact.     Pupils: Pupils are equal, round, and reactive to light.  Pulmonary:     Effort: Pulmonary effort is normal.  Abdominal:     General: There is no distension.  Musculoskeletal:        General: Normal range of motion.     Cervical back: Normal range of motion.  Neurological:     General: No focal deficit present.     Mental Status: He is alert.     Cranial Nerves: No cranial nerve deficit.  Psychiatric:        Mood and Affect: Mood normal.     ED Results / Procedures / Treatments   Labs (all labs ordered are listed, but only abnormal results are displayed) Labs Reviewed  CBC WITH DIFFERENTIAL/PLATELET  COMPREHENSIVE METABOLIC PANEL  URINALYSIS, ROUTINE W REFLEX MICROSCOPIC    EKG None  Radiology CT Head Wo Contrast  Result Date: 03/09/2022 CLINICAL DATA:  Head trauma, moderate-severe; Neck trauma (Age >= 65y). Unwitnessed fall EXAM: CT HEAD WITHOUT CONTRAST CT CERVICAL SPINE WITHOUT CONTRAST TECHNIQUE: Multidetector CT imaging of the head and cervical spine was performed following the standard protocol without intravenous contrast. Multiplanar CT image reconstructions of the cervical spine were also generated. RADIATION DOSE REDUCTION: This exam was performed according to the departmental dose-optimization program which includes automated exposure control, adjustment of the mA and/or kV according to patient size and/or use of iterative reconstruction technique. COMPARISON:  12/30/2021 FINDINGS: CT HEAD FINDINGS Brain: Normal anatomic configuration. Parenchymal volume loss is commensurate with the patient's age. Moderate periventricular white matter changes are present likely reflecting the sequela of small vessel ischemia. Remote lacunar infarct within the right putamen again noted. No abnormal intra or extra-axial mass lesion or fluid collection. No abnormal mass effect or midline shift. No evidence of acute intracranial hemorrhage or infarct. Ventricular size is  normal. Cerebellum unremarkable. Vascular: No asymmetric hyperdense vasculature at the skull base. Skull: Intact Sinuses/Orbits: Paranasal sinuses are clear. Orbits are unremarkable. Other: Mastoid air cells and middle ear cavities are clear. CT CERVICAL SPINE FINDINGS Alignment: Normal. Skull base and vertebrae: Craniocervical alignment is normal. Minimally displaced subacute fracture of the right anterior arch of C1 is again identified with incomplete callus formation and persistent visualization of the fracture plane in keeping with incomplete healing. No acute fracture of the cervical spine. Mild loss of height of the C5 and C6 vertebral bodies is unchanged. Soft tissues and spinal canal: No prevertebral fluid or swelling. No visible canal hematoma. Disc levels: There is advanced endplate remodeling and disc space narrowing throughout the cervical spine, most severe at C4-C7 in keeping with changes of moderate to severe degenerative disc disease. Prevertebral soft tissues are  not thickened on sagittal reformats. Multilevel uncovertebral and facet arthrosis results in multilevel neuroforaminal narrowing, most severe on the left at C5-6 and, to a lesser extent, bilaterally at C3-4 and on the left at C6-7. Upper chest: Negative. Other: None IMPRESSION: 1. No acute intracranial abnormality. No calvarial fracture. 2. No acute fracture or listhesis of the cervical spine. 3. Stable subacute fracture of the right anterior arch of C1 with incomplete healing. Electronically Signed   By: Fidela Salisbury M.D.   On: 03/09/2022 01:20   CT Cervical Spine Wo Contrast  Result Date: 03/09/2022 CLINICAL DATA:  Head trauma, moderate-severe; Neck trauma (Age >= 65y). Unwitnessed fall EXAM: CT HEAD WITHOUT CONTRAST CT CERVICAL SPINE WITHOUT CONTRAST TECHNIQUE: Multidetector CT imaging of the head and cervical spine was performed following the standard protocol without intravenous contrast. Multiplanar CT image reconstructions of  the cervical spine were also generated. RADIATION DOSE REDUCTION: This exam was performed according to the departmental dose-optimization program which includes automated exposure control, adjustment of the mA and/or kV according to patient size and/or use of iterative reconstruction technique. COMPARISON:  12/30/2021 FINDINGS: CT HEAD FINDINGS Brain: Normal anatomic configuration. Parenchymal volume loss is commensurate with the patient's age. Moderate periventricular white matter changes are present likely reflecting the sequela of small vessel ischemia. Remote lacunar infarct within the right putamen again noted. No abnormal intra or extra-axial mass lesion or fluid collection. No abnormal mass effect or midline shift. No evidence of acute intracranial hemorrhage or infarct. Ventricular size is normal. Cerebellum unremarkable. Vascular: No asymmetric hyperdense vasculature at the skull base. Skull: Intact Sinuses/Orbits: Paranasal sinuses are clear. Orbits are unremarkable. Other: Mastoid air cells and middle ear cavities are clear. CT CERVICAL SPINE FINDINGS Alignment: Normal. Skull base and vertebrae: Craniocervical alignment is normal. Minimally displaced subacute fracture of the right anterior arch of C1 is again identified with incomplete callus formation and persistent visualization of the fracture plane in keeping with incomplete healing. No acute fracture of the cervical spine. Mild loss of height of the C5 and C6 vertebral bodies is unchanged. Soft tissues and spinal canal: No prevertebral fluid or swelling. No visible canal hematoma. Disc levels: There is advanced endplate remodeling and disc space narrowing throughout the cervical spine, most severe at C4-C7 in keeping with changes of moderate to severe degenerative disc disease. Prevertebral soft tissues are not thickened on sagittal reformats. Multilevel uncovertebral and facet arthrosis results in multilevel neuroforaminal narrowing, most severe on  the left at C5-6 and, to a lesser extent, bilaterally at C3-4 and on the left at C6-7. Upper chest: Negative. Other: None IMPRESSION: 1. No acute intracranial abnormality. No calvarial fracture. 2. No acute fracture or listhesis of the cervical spine. 3. Stable subacute fracture of the right anterior arch of C1 with incomplete healing. Electronically Signed   By: Fidela Salisbury M.D.   On: 03/09/2022 01:20    Procedures Procedures    Medications Ordered in ED Medications - No data to display  ED Course/ Medical Decision Making/ A&P                           Medical Decision Making Amount and/or Complexity of Data Reviewed Labs: ordered. Radiology: ordered.      Pt's care turned over to Lorre Munroe [facility] ct head pending     Final Clinical Impression(s) / ED Diagnoses Final diagnoses:  Multiple falls    Rx / DC Orders ED Discharge Orders  None         Sidney Ace 03/09/22 2235    Fransico Meadow, PA-C 03/09/22 2330    Godfrey Pick, MD 03/11/22 [phone]

## 2022-03-10 ENCOUNTER — Emergency Department (HOSPITAL_COMMUNITY): Payer: Medicare Other

## 2022-03-10 NOTE — ED Notes (Signed)
Pt able to ambulate with walker, pt states that his walker is in his car.  Daughter says that patients walking looks like his baseline

## 2022-03-10 NOTE — Discharge Instructions (Addendum)
You were seen today following a fall.  Your CT imaging and x-rays are all negative for injury.  Home health orders have been placed for physical therapy and nursing evaluations and home.  Follow-up with primary physician to help with placement if you feel that the patient is no longer appropriate to care for at home.  If you have any new or worsening symptoms, you should be reevaluated.

## 2022-03-10 NOTE — ED Provider Notes (Signed)
Patient signed out pending imaging and lab work.  Lab work reviewed and without any significant metabolic derangements.  No evidence of UTI.  CT scan of the head is negative for acute traumatic injury.  Discussed the results with the daughter at the bedside.  She expresses frustration that "my dad really needs rehab."  He lives with her and "he will stay in bed."  I discussed with her that I did not disagree that he likely may need a higher level of care; however, this is difficult to do out of the emergency department and he does not have any admittable diagnosis.  I have recommended home health evaluation which I placed discharge.  In the meantime, recommend that she follow-up with his primary care physician.  See clinical course below.  Physical Exam  BP (!) 164/89   Pulse 74   Temp 98.9 F (37.2 C) (Oral)   Resp 14   Ht 1.753 m ('5\' 9"'$ )   Wt 73.9 kg   SpO2 97%   BMI 24.06 kg/m   Physical Exam  Procedures  Procedures  ED Course / MDM   Clinical Course as of 03/10/22 0203  Tue Mar 10, 2022  0122 Spoke with daughter regarding negative imaging.  She is unhappy that he is not being admitted or placed.  Discussed with her that this would need to be done as an outpatient.  I have recommended home health evaluation. [CH]  0201 Per nursing, patient ambulated with a walker with minimal assist.  This is reportedly at his baseline. [CH]    Clinical Course User Index [CH] Latandra Loureiro, Barbette Hair, MD   Medical Decision Making Amount and/or Complexity of Data Reviewed Labs: ordered. Radiology: ordered.   Problem List Items Addressed This Visit   None Visit Diagnoses     Multiple falls    -  Primary             Merryl Hacker, MD 03/10/22 [phone]

## 2022-04-24 ENCOUNTER — Other Ambulatory Visit: Payer: Self-pay | Admitting: Family Medicine

## 2022-04-24 DIAGNOSIS — I2510 Atherosclerotic heart disease of native coronary artery without angina pectoris: Secondary | ICD-10-CM

## 2022-05-19 ENCOUNTER — Encounter (HOSPITAL_COMMUNITY): Payer: Self-pay

## 2022-05-19 ENCOUNTER — Other Ambulatory Visit: Payer: Self-pay

## 2022-05-19 ENCOUNTER — Emergency Department (HOSPITAL_COMMUNITY)
Admission: EM | Admit: 2022-05-19 | Discharge: 2022-05-20 | Disposition: A | Discharge status: Home / Self Care | Payer: Medicare Other | Attending: Emergency Medicine | Admitting: Emergency Medicine

## 2022-05-19 ENCOUNTER — Emergency Department (HOSPITAL_COMMUNITY): Payer: Medicare Other

## 2022-05-19 DIAGNOSIS — Z7901 Long term (current) use of anticoagulants: Secondary | ICD-10-CM | POA: Insufficient documentation

## 2022-05-19 DIAGNOSIS — I509 Heart failure, unspecified: Secondary | ICD-10-CM | POA: Diagnosis not present

## 2022-05-19 DIAGNOSIS — I110 Hypertensive heart disease with heart failure: Secondary | ICD-10-CM | POA: Diagnosis not present

## 2022-05-19 DIAGNOSIS — R2681 Unsteadiness on feet: Secondary | ICD-10-CM | POA: Insufficient documentation

## 2022-05-19 DIAGNOSIS — N390 Urinary tract infection, site not specified: Secondary | ICD-10-CM | POA: Diagnosis present

## 2022-05-19 DIAGNOSIS — Z79899 Other long term (current) drug therapy: Secondary | ICD-10-CM | POA: Insufficient documentation

## 2022-05-19 DIAGNOSIS — I2510 Atherosclerotic heart disease of native coronary artery without angina pectoris: Secondary | ICD-10-CM | POA: Insufficient documentation

## 2022-05-19 DIAGNOSIS — J449 Chronic obstructive pulmonary disease, unspecified: Secondary | ICD-10-CM | POA: Diagnosis not present

## 2022-05-19 LAB — CBC WITH DIFFERENTIAL/PLATELET
Abs Immature Granulocytes: 0.02 10*3/uL (ref 0.00–0.07)
Basophils Absolute: 0 10*3/uL (ref 0.0–0.1)
Basophils Relative: 0 %
Eosinophils Absolute: 0 10*3/uL (ref 0.0–0.5)
Eosinophils Relative: 0 %
HCT: 36.5 % — ABNORMAL LOW (ref 39.0–52.0)
Hemoglobin: 12 g/dL — ABNORMAL LOW (ref 13.0–17.0)
Immature Granulocytes: 0 %
Lymphocytes Relative: 19 %
Lymphs Abs: 1.5 10*3/uL (ref 0.7–4.0)
MCH: 30.4 pg (ref 26.0–34.0)
MCHC: 32.9 g/dL (ref 30.0–36.0)
MCV: 92.4 fL (ref 80.0–100.0)
Monocytes Absolute: 0.6 10*3/uL (ref 0.1–1.0)
Monocytes Relative: 7 %
Neutro Abs: 5.8 10*3/uL (ref 1.7–7.7)
Neutrophils Relative %: 74 %
Platelets: 161 10*3/uL (ref 150–400)
RBC: 3.95 MIL/uL — ABNORMAL LOW (ref 4.22–5.81)
RDW: 13.6 % (ref 11.5–15.5)
WBC: 8 10*3/uL (ref 4.0–10.5)
nRBC: 0 % (ref 0.0–0.2)

## 2022-05-19 LAB — BASIC METABOLIC PANEL
Anion gap: 9 (ref 5–15)
BUN: 19 mg/dL (ref 8–23)
CO2: 21 mmol/L — ABNORMAL LOW (ref 22–32)
Calcium: 9.7 mg/dL (ref 8.9–10.3)
Chloride: 104 mmol/L (ref 98–111)
Creatinine, Ser: 1.19 mg/dL (ref 0.61–1.24)
GFR, Estimated: >60 mL/min (ref >60)
Glucose, Bld: 219 mg/dL — ABNORMAL HIGH (ref 70–99)
Potassium: 3.5 mmol/L (ref 3.5–5.1)
Sodium: 134 mmol/L — ABNORMAL LOW (ref 135–145)

## 2022-05-19 MED ORDER — DILTIAZEM HCL 60 MG PO TABS
60.0000 mg | ORAL_TABLET | Freq: Once | ORAL | Status: AC
  Administered 2022-05-19: 60 mg via ORAL
  Filled 2022-05-19 (×2): qty 1

## 2022-05-19 MED ORDER — APIXABAN 5 MG PO TABS
5.0000 mg | ORAL_TABLET | Freq: Two times a day (BID) | ORAL | Status: DC
  Administered 2022-05-19: 5 mg via ORAL
  Filled 2022-05-19: qty 1

## 2022-05-19 MED ORDER — METOPROLOL TARTRATE 25 MG PO TABS
25.0000 mg | ORAL_TABLET | Freq: Once | ORAL | Status: AC
  Administered 2022-05-19: 25 mg via ORAL
  Filled 2022-05-19: qty 1

## 2022-05-19 NOTE — ED Notes (Signed)
Patient and family member provided with urinal, family verbalized understanding of need for urine sample.

## 2022-05-19 NOTE — ED Notes (Signed)
Patient's daughter is leaving leaving number for updates. Joy Haegele, [phone] or [phone]

## 2022-05-19 NOTE — ED Provider Notes (Signed)
[facility] EMERGENCY DEPARTMENT Provider Note   CSN: [id number] Arrival date & time: 05/19/22  0115     History {Add pertinent medical, surgical, social history, OB history to HPI:1} Chief Complaint  Patient presents with  . UTI    Matthew Edwards is a 81 y.o. male with Hx of moderate to severe dementia, essential HTN, COPD, DMT2, atrial fibrillation, CHF, prior CABG.  Presenting to the ED today accompanied by his daughter, who provides most of the history.  She states over the last 4 to 7 days he has had increased memory issues, weakness, and difficulty getting around.  Also reporting some dried blood on his shirt, was unsure where this came from.  States he almost fell yesterday while trying to get the remote, however caught himself.  Did not hit head or lose consciousness.  Also reporting some malodorous urine, suspicious of a possible UTI.  Denies fevers, URI symptoms, chest pain, shortness of breath, N/V/D.  Lives at home with his daughter, who is POA.  Per daughter, patient was released from a rehab facility a few weeks ago.  Was there for 20 days after a fall.  With Hx of chronic leg weakness.  The history is provided by the patient and medical records.      Home Medications Prior to Admission medications   Medication Sig Start Date End Date Taking? Authorizing Provider  atorvastatin (LIPITOR) 40 MG tablet TAKE 1 TABLET BY MOUTH  DAILY AT 6:00 PM Patient taking differently: Take 40 mg by mouth daily. 05/20/21   Lind Covert, MD  budesonide-formoterol (SYMBICORT) 80-4.5 MCG/ACT inhaler Inhale 2 puffs into the lungs 2 (two) times daily. 02/07/21   Chambliss, Jeb Levering, MD  donepezil (ARICEPT) 5 MG tablet TAKE 1 TABLET BY MOUTH AT  BEDTIME Patient taking differently: Take 5 mg by mouth at bedtime. 07/07/21   Lind Covert, MD  furosemide (LASIX) 20 MG tablet TAKE 1 TABLET BY MOUTH DAILY Patient taking differently: Take 20 mg by mouth daily.  08/11/21   Lind Covert, MD  gabapentin (NEURONTIN) 100 MG capsule TAKE 1 CAPSULE BY MOUTH AT  BEDTIME Patient taking differently: Take 100 mg by mouth at bedtime. 09/15/21   Lind Covert, MD  loperamide (IMODIUM A-D) 2 MG tablet Take 1 tablet (2 mg total) by mouth 4 (four) times daily as needed for diarrhea or loose stools. 06/10/21   Lind Covert, MD  metoprolol succinate (TOPROL-XL) 100 MG 24 hr tablet TAKE 1 TABLET BY MOUTH DAILY Patient taking differently: Take 100 mg by mouth daily. 07/21/21   Lind Covert, MD  potassium chloride SA (KLOR-CON) 20 MEQ tablet Take 1 tablet (20 mEq total) by mouth daily. 04/08/21   Lind Covert, MD  rivaroxaban (XARELTO) 20 MG TABS tablet Take 1 tablet (20 mg total) by mouth daily with supper. With a meal Patient not taking: Reported on 12/30/2021 08/01/21   Lind Covert, MD  tamsulosin (FLOMAX) 0.4 MG CAPS capsule Take 1 capsule (0.4 mg total) by mouth daily. 04/08/21   Lind Covert, MD  tiZANidine (ZANAFLEX) 2 MG tablet Take 2 mg by mouth at bedtime as needed for muscle spasms. 12/04/21   [provider]  traZODone (DESYREL) 50 MG tablet Take 50 mg by mouth at bedtime. 02/24/22   [provider]  VITAMIN D PO Take 1 tablet by mouth daily.    [provider]  warfarin (COUMADIN) 5 MG tablet Take 5 mg by  mouth daily.    [provider]      Allergies    Pineapple concentrate    Review of Systems   Review of Systems  Genitourinary:        Malodorous urine  Neurological:        Increased weakness    Physical Exam Updated Vital Signs BP (!) 136/118   Pulse 90   Temp 98 F (36.7 C)   Resp 20   SpO2 100%  Physical Exam Vitals and nursing note reviewed.  Constitutional:      General: He is not in acute distress.    Appearance: He is well-developed. He is not toxic-appearing or diaphoretic.  HENT:     Head: Normocephalic and atraumatic.     Right Ear: External  ear normal.     Left Ear: External ear normal.     Nose: Nose normal.     Mouth/Throat:     Mouth: Mucous membranes are dry.     Pharynx: Oropharynx is clear.  Eyes:     General: No scleral icterus.    Conjunctiva/sclera: Conjunctivae normal.  Cardiovascular:     Rate and Rhythm: Tachycardia present. Rhythm irregular.     Heart sounds: No murmur heard. Pulmonary:     Effort: Pulmonary effort is normal. No respiratory distress.     Breath sounds: Normal breath sounds.  Abdominal:     General: There is no distension.     Palpations: Abdomen is soft.     Tenderness: There is no abdominal tenderness.  Musculoskeletal:        General: No swelling.     Cervical back: Neck supple. No rigidity.     Right lower leg: No edema.     Left lower leg: No edema.  Skin:    General: Skin is warm and dry.     Capillary Refill: Capillary refill takes less than 2 seconds.     Coloration: Skin is not jaundiced or pale.  Neurological:     Mental Status: He is alert. Mental status is at baseline. He is disoriented.     Comments: Unable to assess gait due to patient state.  PERRLA.  EOMI to provider's face.  Hx of moderate-severe dementia.  Psychiatric:        Mood and Affect: Mood normal.    ED Results / Procedures / Treatments   Labs (all labs ordered are listed, but only abnormal results are displayed) Labs Reviewed  CBC WITH DIFFERENTIAL/PLATELET - Abnormal; Notable for the following components:      Result Value   RBC 3.95 (*)    Hemoglobin 12.0 (*)    HCT 36.5 (*)    All other components within normal limits  BASIC METABOLIC PANEL - Abnormal; Notable for the following components:   Sodium 134 (*)    CO2 21 (*)    Glucose, Bld 219 (*)    All other components within normal limits  URINE CULTURE  URINALYSIS, ROUTINE W REFLEX MICROSCOPIC    EKG EKG Interpretation  Date/Time:  Tuesday May 19 2022 21:16:25 EST Ventricular Rate:  132 PR Interval:    QRS Duration: 68 QT  Interval:  284 QTC Calculation: 420 R Axis:   79 Text Interpretation: Atrial fibrillation with rapid ventricular response ST & T wave abnormality, consider inferior ischemia Abnormal ECG When compared with ECG of 09-Mar-2022 20:45, rate is increased and nonspecific ts Confirmed by Aletta Edouard [id number]) on 05/19/2022 9:18:55 PM  Radiology DG Chest 1 View  Result Date: 05/19/2022 CLINICAL DATA:  Weakness. EXAM: CHEST  1 VIEW COMPARISON:  03/09/2022. FINDINGS: The heart is enlarged. There is a prosthetic cardiac valve with sternotomy wires over the midline. Atherosclerotic calcification of the aorta is noted. Lung volumes are low. Stable atelectasis or scarring is present at the left lung base. No effusion or pneumothorax. IMPRESSION: 1. Stable atelectasis or scarring at the left lung base. 2. Cardiomegaly. Electronically Signed   By: Brett Fairy M.D.   On: 05/19/2022 21:23    Procedures Procedures  {Document cardiac monitor, telemetry assessment procedure when appropriate:1}  Medications Ordered in ED Medications - No data to display  ED Course/ Medical Decision Making/ A&P Clinical Course as of 05/19/22 2137  Tue May 20, 4087  6316 81 year old male with history of dementia brought in by his daughter for increased unsteadiness possible fall possible UTI.  Patient unable to give any history.  He is tachycardic with an irregularly irregular pulse.  Getting labs and imaging, EKG.  May need some fluids and rate control.  Disposition per results of testing [MB]    Clinical Course User Index [MB] Hayden Rasmussen, MD                           Medical Decision Making  81 y.o. male presents to the ED for concern of UTI     This involves an extensive number of treatment options, and is a complaint that carries with it a high risk of complications and morbidity.  The emergent differential diagnosis prior to evaluation includes, but is not limited to: ***  This is not an exhaustive  differential.   Past Medical History / Co-morbidities / Social History: Hx of moderate to severe dementia, essential HTN, COPD, DMT2, atrial fibrillation, CHF, prior CABG Social Determinants of Health include: Elderly, dementia  Additional History:  Obtained by chart review.  Notably recent ED visit, 03/19/2022 admitted for A-fib with RVR and acute COVID-19 with hypoxemic respiratory failure.  Lab Tests: I ordered, and personally interpreted labs.  The pertinent results include:      Imaging Studies: I ordered imaging studies including CXR, CT head.   I independently visualized and interpreted imaging which showed *** I agree with the radiologist interpretation.  Cardiac Monitoring: The patient was maintained on a cardiac monitor.  I personally viewed and interpreted the cardiac monitored which showed an underlying rhythm of: Afib  ED Course / Critical Interventions: Pt well-appearing on exam.  ***  Upon reevaluation, *** I have reviewed the patients home medicines and have made adjustments as needed.  Disposition: Considered admission and after reviewing the patient's encounter today, I feel that the patient would benefit from ***.  Discussed course of treatment with the patient, whom demonstrated understanding.  Patient in agreement and has no further questions.    I discussed this case with my attending, Dr. Melina Copa, who agreed with the proposed treatment course and cosigned this note.  Attending physician stated agreement with plan or made changes to plan which were implemented.     This chart was dictated using voice recognition software.  Despite best efforts to proofread, errors can occur which can change the documentation meaning.   {Document critical care time when appropriate:1} {Document review of labs and clinical decision tools ie heart score, Chads2Vasc2 etc:1}  {Document your independent review of radiology images, and any outside records:1} {Document your  discussion with family members, caretakers, and with consultants:1} {Document social determinants  of health affecting pt's care:1} {Document your decision making why or why not admission, treatments were needed:1} Final Clinical Impression(s) / ED Diagnoses Final diagnoses:  None    Rx / DC Orders ED Discharge Orders     None

## 2022-05-19 NOTE — ED Triage Notes (Signed)
EMS reports strong malodorous urine , family suspected that patient fell this evening with no apparent injury.

## 2022-05-19 NOTE — ED Notes (Signed)
Pt has 19m of urine via bladder scan

## 2022-05-19 NOTE — ED Notes (Signed)
Patient was taken to bathroom to void, but was unable to void at this time.

## 2022-05-19 NOTE — ED Notes (Signed)
Patient called for urine sample, no response

## 2022-05-19 NOTE — ED Provider Triage Note (Signed)
  Emergency Medicine Provider Triage Evaluation Note  MRN:  [id number]  Arrival date & time: 05/19/22    Medically screening exam initiated at 1:41 AM.   CC:   UTI   HPI:  Matthew Edwards is a 81 y.o. year-old male presents to the ED with chief complaint of foul smelling urine.  Patient states that it hurts a little when he urinates and that he has urgency.  Family was concerned about a fall, but no evidence of trauma.  Family left him and he was in his chair and when they returned he was also in his chair, but they were concerned about a fall (per EMS).  History provided by patient and EMS ROS:  -As included in HPI PE:   Vitals:   05/19/22 0138  BP: (!) 90/54  Pulse: 76  Resp: 15  Temp: 98 F (36.7 C)  SpO2: 98%    Non-toxic appearing No respiratory distress  MDM:  Based on signs and symptoms, UTI is highest on my differential. I've ordered labs and urine in triage to expedite lab/diagnostic workup.  Patient was informed that the remainder of the evaluation will be completed by another provider, this initial triage assessment does not replace that evaluation, and the importance of remaining in the ED until their evaluation is complete.    Montine Circle, PA-C 05/19/22 [phone]

## 2022-05-20 LAB — URINALYSIS, ROUTINE W REFLEX MICROSCOPIC
Bilirubin Urine: NEGATIVE
Glucose, UA: NEGATIVE mg/dL
Hgb urine dipstick: NEGATIVE
Ketones, ur: NEGATIVE mg/dL
Nitrite: NEGATIVE
Protein, ur: NEGATIVE mg/dL
Specific Gravity, Urine: 1.023 (ref 1.005–1.030)
pH: 5 (ref 5.0–8.0)

## 2022-05-20 NOTE — Discharge Instructions (Signed)
Your workup today was largely unremarkable.  Please follow-up with PCP, social work for further management of home health needs. Please have patient return to ED with any new or worsening signs or symptoms. Please continue to have the patient drink plenty of fluids

## 2022-05-20 NOTE — ED Provider Notes (Signed)
  Physical Exam  BP (!) 136/118   Pulse 90   Temp 98 F (36.7 C)   Resp 20   SpO2 100%   Physical Exam Vitals and nursing note reviewed.  Constitutional:      General: He is not in acute distress.    Appearance: He is well-developed.  HENT:     Head: Normocephalic and atraumatic.  Eyes:     Conjunctiva/sclera: Conjunctivae normal.  Cardiovascular:     Rate and Rhythm: Normal rate and regular rhythm.     Heart sounds: No murmur heard. Pulmonary:     Effort: Pulmonary effort is normal. No respiratory distress.     Breath sounds: Normal breath sounds.  Abdominal:     Palpations: Abdomen is soft.     Tenderness: There is no abdominal tenderness.  Musculoskeletal:        General: No swelling.     Cervical back: Neck supple.  Skin:    General: Skin is warm and dry.     Capillary Refill: Capillary refill takes less than 2 seconds.  Neurological:     Mental Status: He is alert. Mental status is at baseline.  Psychiatric:        Mood and Affect: Mood normal.     Procedures  Procedures  ED Course / MDM   Clinical Course as of 05/20/22 0130  Tue May 20, 4751  2668 81 year old male with history of dementia brought in by his daughter for increased unsteadiness possible fall possible UTI.  Patient unable to give any history.  He is tachycardic with an irregularly irregular pulse.  Getting labs and imaging, EKG.  May need some fluids and rate control.  Disposition per results of testing [MB]    Clinical Course User Index [MB] Hayden Rasmussen, MD   Medical Decision Making Amount and/or Complexity of Data Reviewed Labs: ordered. Radiology: ordered.  Risk Prescription drug management.   Patient signed out to me at shift change pending urinalysis.  Please see previous provider note for further details of the event.  In short this is a 81 year old male with a history of dementia brought in by his daughter for increased unsteadiness, possible fall, possible UTI.  Patient  unable to give much information.  Patient noted to be in A-fib RVR on arrival, has history of the same.  Patient received medications, rate was controlled.  Patient signed out pending urinalysis.  Patient urinalysis unremarkable, no evidence of infection.  Patient daughters were notified of patient pending discharge.  Patient daughter, [name], who lives with her father at home states that she is able to accept patient back.  Patient will be pending discharge.       Azucena Cecil, PA-C 05/20/22 0131    Fatima Blank, MD 05/20/22 [phone]

## 2022-05-20 NOTE — ED Notes (Signed)
Ptar called 

## 2022-05-21 LAB — URINE CULTURE: Culture: >=100000 — AB

## 2022-05-22 ENCOUNTER — Telehealth (HOSPITAL_BASED_OUTPATIENT_CLINIC_OR_DEPARTMENT_OTHER): Payer: Self-pay | Admitting: Emergency Medicine

## 2022-05-22 NOTE — Progress Notes (Signed)
ED Antimicrobial Stewardship Positive Culture Follow Up   Matthew Edwards is an 81 y.o. male who presented to [facility] on 05/19/2022 with a chief complaint of  Chief Complaint  Patient presents with   UTI    Recent Results (from the past 68 hour(s))  Urine Culture     Status: Abnormal   Collection Time: 05/20/22 12:32 AM   Specimen: Urine, Clean Catch  Result Value Ref Range Status   Specimen Description URINE, CLEAN CATCH  Final   Special Requests NONE  Final   Culture (A)  Final    >=100,000 COLONIES/mL AEROCOCCUS SPECIES Standardized susceptibility testing for this organism is not available. Performed at [facility], [place] [address], [place], [place] [address]    Report Status 05/21/2022 FINAL  Final    '[x]'$  Patient discharged originally without antimicrobial agent and treatment is now indicated  New antibiotic prescription: Amoxicillin '500mg'$  by mouth twice daily x 7 days  ED Provider: Donald Prose, PA-C   Luisa Hart, PharmD, BCPS Clinical Pharmacist 05/22/2022 11:25 AM  Monday - Friday phone -  [phone] Saturday - Sunday phone - [phone]

## 2022-05-22 NOTE — Telephone Encounter (Signed)
Post ED Visit - Positive Culture Follow-up: Successful Patient Follow-Up  Culture assessed and recommendations reviewed by:  '[]'$  Elenor Quinones, Pharm.D. '[]'$  Heide Guile, Pharm.D., BCPS AQ-ID '[]'$  Parks Neptune, Pharm.D., BCPS '[]'$  Alycia Rossetti, Pharm.D., BCPS '[]'$  [place], [place].D., BCPS, AAHIVP '[]'$  Legrand Como, Pharm.D., BCPS, AAHIVP '[]'$  Salome Arnt, PharmD, BCPS '[]'$  Johnnette Gourd, PharmD, BCPS '[]'$  Hughes Better, PharmD, BCPS '[x]'$  Luisa Hart, PharmD  Positive urine culture  '[x]'$  Patient discharged without antimicrobial prescription and treatment is now indicated '[]'$  Organism is resistant to prescribed ED discharge antimicrobial '[]'$  Patient with positive blood cultures  Changes discussed with ED provider: Lourdes Sledge PA  New antibiotic prescription Amoxicillin 500 mg by mouth twice daily for seven days Called to [facility] [phone])  Contacted patient's daughter Hilda Blades, date 05/22/22, time [place] 05/22/2022, 2:33 PM

## 2022-05-29 ENCOUNTER — Encounter: Payer: Self-pay | Admitting: Family Medicine

## 2022-06-25 ENCOUNTER — Other Ambulatory Visit: Payer: Self-pay | Admitting: Family Medicine

## 2022-06-25 DIAGNOSIS — I2510 Atherosclerotic heart disease of native coronary artery without angina pectoris: Secondary | ICD-10-CM

## 2022-07-16 ENCOUNTER — Other Ambulatory Visit: Payer: Self-pay | Admitting: Family Medicine

## 2022-07-16 DIAGNOSIS — I2510 Atherosclerotic heart disease of native coronary artery without angina pectoris: Secondary | ICD-10-CM

## 2022-09-02 ENCOUNTER — Other Ambulatory Visit: Payer: Self-pay | Admitting: Family Medicine

## 2022-09-02 ENCOUNTER — Telehealth: Payer: Self-pay | Admitting: *Deleted

## 2022-09-02 DIAGNOSIS — I10 Essential (primary) hypertension: Secondary | ICD-10-CM

## 2022-09-02 DIAGNOSIS — I4891 Unspecified atrial fibrillation: Secondary | ICD-10-CM

## 2022-09-02 NOTE — Progress Notes (Signed)
  Care Coordination  Outreach Note  09/02/2022 Name: Matthew Edwards MRN: [id number] DOB: [birth year]   Care Coordination Outreach Attempts: An unsuccessful telephone outreach was attempted today to offer the patient information about available care coordination services as a benefit of their health plan.   Follow Up Plan:  Additional outreach attempts will be made to offer the patient care coordination information and services.   Encounter Outcome:  No Answer  [place]  Direct Dial: [phone]

## 2022-09-04 ENCOUNTER — Telehealth: Payer: Self-pay | Admitting: Family Medicine

## 2022-09-04 NOTE — Telephone Encounter (Signed)
Called patient to schedule Medicare Annual Wellness Visit (AWV). Unable to reach patient.  Last date of AWV: AWVI eligible as of 12/31/2011   Please schedule an AWVI appointment at any time with [facility] VISIT.  If any questions, please contact me at [phone].    Thank you,  [facility] Support [facility] Direct dial  [phone]

## 2022-09-07 NOTE — Progress Notes (Signed)
  Care Coordination   Note   09/07/2022 Name: Matthew Edwards MRN: [id number] DOB: 1941/04/29  Matthew Edwards is a 82 y.o. year old male who sees Chambliss, Estill Batten, MD for primary care. I reached out to Christell Faith by phone today to offer care coordination services.  Mr. Falcon was given information about Care Coordination services today including:   The Care Coordination services include support from the care team which includes your Nurse Coordinator, Clinical Social Worker, or Pharmacist.  The Care Coordination team is here to help remove barriers to the health concerns and goals most important to you. Care Coordination services are voluntary, and the patient may decline or stop services at any time by request to their care team member.   Care Coordination Consent Status: Patient daughter Matthew Edwards did not agree to participate in care coordination services at this time patient has a new PCP through [facility].   Encounter Outcome:  Pt. Refused  [facility] Coordination Care Guide  Direct Dial: [phone]

## 2022-11-27 ENCOUNTER — Ambulatory Visit (INDEPENDENT_AMBULATORY_CARE_PROVIDER_SITE_OTHER): Payer: Medicare Other | Admitting: Podiatry

## 2022-11-27 ENCOUNTER — Encounter: Payer: Self-pay | Admitting: Podiatry

## 2022-11-27 VITALS — BP 145/81

## 2022-11-27 DIAGNOSIS — M79675 Pain in left toe(s): Secondary | ICD-10-CM | POA: Diagnosis not present

## 2022-11-27 DIAGNOSIS — B351 Tinea unguium: Secondary | ICD-10-CM | POA: Diagnosis not present

## 2022-11-27 DIAGNOSIS — E1140 Type 2 diabetes mellitus with diabetic neuropathy, unspecified: Secondary | ICD-10-CM | POA: Diagnosis not present

## 2022-11-27 DIAGNOSIS — M79674 Pain in right toe(s): Secondary | ICD-10-CM

## 2022-11-27 NOTE — Progress Notes (Signed)
This patient returns to my office for at risk foot care.  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  This patient is unable to cut nails himself since the patient cannot reach his nails.These nails are painful walking and wearing shoes.  This patient presents for at risk foot care today. He presents to the office today with his daughter.  General Appearance  Alert, conversant and in no acute stress.  Vascular  Dorsalis pedis and posterior tibial  pulses are palpable  bilaterally.  Capillary return is within normal limits  bilaterally. Temperature is within normal limits  bilaterally.  Neurologic  Senn-Weinstein monofilament wire test within normal limits  bilaterally. Muscle power within normal limits bilaterally.  Nails Thick disfigured discolored nails with subungual debris  from hallux to fifth toes bilaterally. No evidence of bacterial infection or drainage bilaterally.  Orthopedic  No limitations of motion  feet .  No crepitus or effusions noted.  No bony pathology or digital deformities noted.  Skin  normotropic skin with no porokeratosis noted bilaterally.  No signs of infections or ulcers noted.     Onychomycosis  Pain in right toes  Pain in left toes  Consent was obtained for treatment procedures.   Mechanical debridement of nails 1-5  bilaterally performed with a nail nipper.  Filed with dremel without incident.    Return office visit   3 months                   Told patient to return for periodic foot care and evaluation due to potential at risk complications.   Helane Gunther DPM

## 2022-12-26 NOTE — Progress Notes (Deleted)
Cardiology Clinic Note   Date: 12/26/2022 ID: CARDON TREVORROW, DOB 03-30-1941, MRN [id number]  Primary Cardiologist:  None  Patient Profile    Matthew Edwards is a 82 y.o. male who presents to the clinic today for ***    Past medical history significant for: CAD. R/LHC 03/31/2005 (unstable angina): LAD with calcified mid segment 75% lesion just proximal to D1.  Ostial D1 95%.  Proximal OM1 80%.  AV groove 75% just before continuation to posterolateral branch.  Posterolateral branch 70%.  Mild to moderate aortic stenosis.  Final disposition to be made after collaborative discussion of PCI versus CABG with AVR. LHC 04/21/2005 (staged PCI): PCI with Cutting Balloon atherectomy distal and mid LCx followed by DES to distal circumflex/PLA, DES to mid LAD. LHC 11/15/2006 (angina): Multivessel CAD with patent stents LAD and LCx.  New lesion in proximal OM1 95%.  PCI with BMS to OM1. R/LHC 06/30/2010 (aortic stenosis): Moderate to moderately severe aortic stenosis and noncritical CAD. CABG x 1 07/29/2010: LIMA to LAD. PAF. Onset February 2012 postop AVR. Aortic valve stenosis. AVR 07/29/2010: 25 mm Edwards pericardial Magna Ease tissue valve. Echo 01/07/2020: Pericardial valve present in the aortic position with mean gradient 2.7 mmHg. Chronic diastolic heart failure/pulmonary hypertension. Echo 01/07/2020: EF 60 to 65%.  Mild asymmetric LVH of the septal segment.  Indeterminate diastolic parameters.  Moderately reduced RV function.  Moderately elevated PA pressure.  Severe BAE.  Mild MR.  Moderate mitral stenosis, mean gradient 7 mmHg.  Moderate to severe TR. Hypertension. Hyperlipidemia. COPD. OSA. GERD. T2DM. Dementia.     History of Present Illness    Matthew Edwards is a longtime patient of cardiology initially followed by Dr. Allyson Sabal.  He change his care to Dr. Swaziland on 07/09/2011 secondary to change in insurance.  Patient continues to be followed for the above outlined history.  Patient was  last seen in the office by Dr. Swaziland on 02/13/2020 for routine follow-up.  At the time of his visit he had a couple of hospital admissions for falls and altered mental status.  At last hospital mission prior to visit he was noted to be in A-fib with RVR initially managed with diltiazem then transition to metoprolol.  Echo showed normal LV function, moderate mitral stenosis with mild MR, well-functioning AV prosthesis, severe BAE, moderate pulmonary hypertension.  He had a brief stay in a SNF before returning home with his daughter.  He was stable at the time of his visit.  Patient was last seen in the ED on 05/19/2022 for increased unsteadiness, possible fall and malodorous urine.  He was noted to be in A-fib with RVR.  He received medications for rate control.  UA was unremarkable.  And he was discharged home with daughter.  Today, patient ***  CAD.  S/p PCI with DES distal Cx/PLA and DES to LAD November 2006, BMS to OM1 June 2008, CABG x 02 July 2010. Patient *** Continue atorvastatin, metoprolol. Aortic valve stenosis/mitral stenosis.  S/p AVR February 2012 with tissue valve.  Echo August 2021 showed normal function of prosthesis.  Moderate mitral stenosis with mild MR.  Patient*** At last visit September 2021 Dr. Swaziland noted patient is not a candidate for valve replacement. Chronic diastolic heart failure/pulmonary hypertension.  Echo August 2021 showed normal LV function, moderately reduced RV function, moderately elevated PA pressure, severe BAE.  Patient*** Euvolemic and well compensated on exam.  Continue Lasix, metoprolol. PAF.  Onset postop AVR February 2012.  Patient*** Hypertension: BP  today *** Patient denies headaches, dizziness or vision changes. Continue ***   ROS: All other systems reviewed and are otherwise negative except as noted in History of Present Illness.  Studies Reviewed       ***  Risk Assessment/Calculations    {Does this patient have ATRIAL  FIBRILLATION?[phone]} No BP recorded.  {Refresh Note OR Click here to enter BP  :1}***        Physical Exam    VS:  There were no vitals taken for this visit. , BMI There is no height or weight on file to calculate BMI.  GEN: Well nourished, well developed, in no acute distress. Neck: No JVD or carotid bruits. Cardiac: *** RRR. No murmurs. No rubs or gallops.   Respiratory:  Respirations regular and unlabored. Clear to auscultation without rales, wheezing or rhonchi. GI: Soft, nontender, nondistended. Extremities: Radials/DP/PT 2+ and equal bilaterally. No clubbing or cyanosis. No edema ***  Skin: Warm and dry, no rash. Neuro: Strength intact.  Assessment & Plan   ***  Disposition: ***     {Are you ordering a CV Procedure (e.g. stress test, cath, DCCV, TEE, etc)?   Press F2        :[id number]}   Signed, Etta Grandchild. , DNP, NP-C

## 2022-12-28 ENCOUNTER — Ambulatory Visit: Payer: Medicare Other | Admitting: Student

## 2022-12-28 DIAGNOSIS — I359 Nonrheumatic aortic valve disorder, unspecified: Secondary | ICD-10-CM

## 2022-12-28 DIAGNOSIS — I4891 Unspecified atrial fibrillation: Secondary | ICD-10-CM

## 2022-12-28 DIAGNOSIS — I2510 Atherosclerotic heart disease of native coronary artery without angina pectoris: Secondary | ICD-10-CM

## 2022-12-28 DIAGNOSIS — I10 Essential (primary) hypertension: Secondary | ICD-10-CM

## 2023-01-29 ENCOUNTER — Ambulatory Visit: Payer: Medicare Other | Admitting: Student

## 2023-02-07 ENCOUNTER — Other Ambulatory Visit: Payer: Self-pay | Admitting: Family Medicine

## 2023-02-07 DIAGNOSIS — I2510 Atherosclerotic heart disease of native coronary artery without angina pectoris: Secondary | ICD-10-CM

## 2023-02-14 ENCOUNTER — Other Ambulatory Visit: Payer: Self-pay

## 2023-02-14 ENCOUNTER — Emergency Department (HOSPITAL_COMMUNITY)
Admission: EM | Admit: 2023-02-14 | Discharge: 2023-02-14 | Disposition: A | Discharge status: Home / Self Care | Payer: Medicare Other | Attending: Emergency Medicine | Admitting: Emergency Medicine

## 2023-02-14 ENCOUNTER — Emergency Department (HOSPITAL_COMMUNITY): Payer: Medicare Other

## 2023-02-14 DIAGNOSIS — W010XXA Fall on same level from slipping, tripping and stumbling without subsequent striking against object, initial encounter: Secondary | ICD-10-CM

## 2023-02-14 DIAGNOSIS — Z23 Encounter for immunization: Secondary | ICD-10-CM | POA: Insufficient documentation

## 2023-02-14 DIAGNOSIS — Z79899 Other long term (current) drug therapy: Secondary | ICD-10-CM | POA: Diagnosis not present

## 2023-02-14 DIAGNOSIS — T148XXA Other injury of unspecified body region, initial encounter: Secondary | ICD-10-CM

## 2023-02-14 DIAGNOSIS — I10 Essential (primary) hypertension: Secondary | ICD-10-CM

## 2023-02-14 DIAGNOSIS — F0390 Unspecified dementia without behavioral disturbance: Secondary | ICD-10-CM

## 2023-02-14 DIAGNOSIS — Z7901 Long term (current) use of anticoagulants: Secondary | ICD-10-CM

## 2023-02-14 DIAGNOSIS — S5002XA Contusion of left elbow, initial encounter: Secondary | ICD-10-CM

## 2023-02-14 DIAGNOSIS — W0110XA Fall on same level from slipping, tripping and stumbling with subsequent striking against unspecified object, initial encounter: Secondary | ICD-10-CM | POA: Insufficient documentation

## 2023-02-14 DIAGNOSIS — M25522 Pain in left elbow: Secondary | ICD-10-CM | POA: Diagnosis present

## 2023-02-14 DIAGNOSIS — Y9301 Activity, walking, marching and hiking: Secondary | ICD-10-CM | POA: Insufficient documentation

## 2023-02-14 DIAGNOSIS — R030 Elevated blood-pressure reading, without diagnosis of hypertension: Secondary | ICD-10-CM

## 2023-02-14 LAB — CBC
HCT: 41.1 % (ref 39.0–52.0)
Hemoglobin: 12.9 g/dL — ABNORMAL LOW (ref 13.0–17.0)
MCH: 27.8 pg (ref 26.0–34.0)
MCHC: 31.4 g/dL (ref 30.0–36.0)
MCV: 88.6 fL (ref 80.0–100.0)
Platelets: 176 10*3/uL (ref 150–400)
RBC: 4.64 MIL/uL (ref 4.22–5.81)
RDW: 14.4 % (ref 11.5–15.5)
WBC: 5.6 10*3/uL (ref 4.0–10.5)
nRBC: 0 % (ref 0.0–0.2)

## 2023-02-14 LAB — BASIC METABOLIC PANEL
Anion gap: 5 (ref 5–15)
BUN: 10 mg/dL (ref 8–23)
CO2: 29 mmol/L (ref 22–32)
Calcium: 9.5 mg/dL (ref 8.9–10.3)
Chloride: 104 mmol/L (ref 98–111)
Creatinine, Ser: 0.71 mg/dL (ref 0.61–1.24)
GFR, Estimated: >60 mL/min (ref >60)
Glucose, Bld: 91 mg/dL (ref 70–99)
Potassium: 3.1 mmol/L — ABNORMAL LOW (ref 3.5–5.1)
Sodium: 138 mmol/L (ref 135–145)

## 2023-02-14 LAB — PROTIME-INR
INR: 1.8 — ABNORMAL HIGH (ref 0.8–1.2)
Prothrombin Time: 20.8 s — ABNORMAL HIGH (ref 11.4–15.2)

## 2023-02-14 LAB — CBG MONITORING, ED: Glucose-Capillary: 89 mg/dL (ref 70–99)

## 2023-02-14 MED ORDER — POTASSIUM CHLORIDE CRYS ER 20 MEQ PO TBCR: EXTENDED_RELEASE_TABLET | ORAL | 0 refills | Status: DC

## 2023-02-14 MED ORDER — ACETAMINOPHEN 500 MG PO TABS
1000.0000 mg | ORAL_TABLET | Freq: Once | ORAL | Status: AC
  Administered 2023-02-14: 1000 mg via ORAL
  Filled 2023-02-14: qty 2

## 2023-02-14 MED ORDER — TETANUS-DIPHTH-ACELL PERTUSSIS 5-2.5-18.5 LF-MCG/0.5 IM SUSY
0.5000 mL | PREFILLED_SYRINGE | Freq: Once | INTRAMUSCULAR | Status: AC
  Administered 2023-02-14: 0.5 mL via INTRAMUSCULAR
  Filled 2023-02-14: qty 0.5

## 2023-02-14 MED ORDER — POTASSIUM CHLORIDE CRYS ER 20 MEQ PO TBCR
40.0000 meq | EXTENDED_RELEASE_TABLET | Freq: Once | ORAL | Status: AC
  Administered 2023-02-14: 40 meq via ORAL
  Filled 2023-02-14: qty 2

## 2023-02-14 NOTE — ED Notes (Signed)
Daughter to come pick up pt.

## 2023-02-14 NOTE — ED Notes (Signed)
Pt ambulatory to and from bathroom with RN stand by. Pt updated he is awaiting daughter to pick him up

## 2023-02-14 NOTE — ED Notes (Signed)
Discharge instructions provided by edp were discussed with daughter at bedside. Pt's daughter at bedside was able to verbalize understanding with no additional questions at this time. Pt wheelchair to car.

## 2023-02-14 NOTE — Discharge Instructions (Addendum)
It was our pleasure to provide your ER care today - we hope that you feel better.  Fall precautions. Drink plenty of fluids/stay well hydrated. Get adequate nutrition, eat meals regularly and do not skip or delay meals.   Keep abrasion very clean - wash with soap and water daily.  From today's labs, your potassium level is low - eat plenty of fruits and vegetables, take potassium supplement as prescribed, and follow up with your doctor in the next couple weeks.   Return to ER if worse, new symptoms, fevers, new/severe pain, chest pain, trouble breathing, or other concern.

## 2023-02-14 NOTE — ED Triage Notes (Addendum)
Pt arrives to ED via [facility]. [facility] reports pt was walking towards daughter when he missed his step on a crub. Pt fell onto his left side. No head injury/LOC. Has skin tear left elbow. GCS 13, dementia at baseline.   Pt was also found to be hypoglycemic at bedside. Initial CBG read "low." Repeat CBG after juice, 87. HX T2DM

## 2023-02-14 NOTE — ED Notes (Signed)
Patient transported to CT 

## 2023-02-14 NOTE — ED Provider Notes (Signed)
Matthew Edwards   CSN: [id number] Arrival date & time: 02/14/23  2021     History  Chief Complaint  Patient presents with   Fall   Hypoglycemia    Matthew Edwards is a 82 y.o. male.  Pt s/p fall. Was walking outside, mis--stepped on curb, fell to left side, hit elbow. No loc noted. Is on anticoagulant therapy. Pt with dementia, limited historian, level 5 caveat, pts mental status and functional ability described as being c/w baseline post fall. Pt denies specific c/o or pain. Contusion/abrasion to elbow noted. No neck or back pain. No chest pain or abd pain. No vomiting. No other extremity pain.   The history is provided by the patient, the EMS personnel and medical records. The history is limited by the condition of the patient.  Fall       Home Medications Prior to Admission medications   Medication Sig Start Date End Date Taking? Authorizing Provider  atorvastatin (LIPITOR) 40 MG tablet TAKE 1 TABLET BY MOUTH  DAILY AT 6:00 PM  No further refills until patient has office visit Thanks 07/16/22   Carney Living, MD  budesonide-formoterol (SYMBICORT) 80-4.5 MCG/ACT inhaler Inhale 2 puffs into the lungs 2 (two) times daily. 02/07/21   Chambliss, Estill Batten, MD  donepezil (ARICEPT) 5 MG tablet TAKE 1 TABLET BY MOUTH AT  BEDTIME Patient taking differently: Take 5 mg by mouth at bedtime. 07/07/21   Carney Living, MD  furosemide (LASIX) 20 MG tablet TAKE 1 TABLET BY MOUTH DAILY Patient taking differently: Take 20 mg by mouth daily. 08/11/21   Carney Living, MD  gabapentin (NEURONTIN) 100 MG capsule TAKE 1 CAPSULE BY MOUTH AT  BEDTIME Patient taking differently: Take 100 mg by mouth at bedtime. 09/15/21   Carney Living, MD  loperamide (IMODIUM A-D) 2 MG tablet Take 1 tablet (2 mg total) by mouth 4 (four) times daily as needed for diarrhea or loose stools. 06/10/21   Carney Living, MD  metoprolol  succinate (TOPROL-XL) 100 MG 24 hr tablet TAKE 1 TABLET BY MOUTH DAILY Patient taking differently: Take 100 mg by mouth daily. 07/21/21   Carney Living, MD  potassium chloride SA (KLOR-CON) 20 MEQ tablet Take 1 tablet (20 mEq total) by mouth daily. 04/08/21   Carney Living, MD  rivaroxaban (XARELTO) 20 MG TABS tablet Take 1 tablet (20 mg total) by mouth daily with supper. With a meal 08/01/21   Carney Living, MD  tamsulosin (FLOMAX) 0.4 MG CAPS capsule Take 1 capsule (0.4 mg total) by mouth daily. 04/08/21   Carney Living, MD  tiZANidine (ZANAFLEX) 2 MG tablet Take 2 mg by mouth at bedtime as needed for muscle spasms. 12/04/21   [provider]  traZODone (DESYREL) 50 MG tablet Take 50 mg by mouth at bedtime. 02/24/22   [provider]  VITAMIN D PO Take 1 tablet by mouth daily.    [provider]  warfarin (COUMADIN) 5 MG tablet Take 5 mg by mouth daily.    [provider]      Allergies    Pineapple concentrate    Review of Systems   Review of Systems  Unable to perform ROS: Dementia    Physical Exam Updated Vital Signs BP (!) 150/81   Pulse (!) 59   Temp 98.2 F (36.8 C) (Oral)   Resp 14   SpO2 96%  Physical Exam Vitals and nursing  Edwards reviewed.  Constitutional:      Appearance: Normal appearance. He is well-developed.  HENT:     Head: Atraumatic.     Nose: Nose normal.     Mouth/Throat:     Mouth: Mucous membranes are moist.     Pharynx: Oropharynx is clear.  Eyes:     General: No scleral icterus.    Conjunctiva/sclera: Conjunctivae normal.     Pupils: Pupils are equal, round, and reactive to light.  Neck:     Vascular: No carotid bruit.     Trachea: No tracheal deviation.  Cardiovascular:     Rate and Rhythm: Normal rate and regular rhythm.     Pulses: Normal pulses.     Heart sounds: Normal heart sounds. No murmur heard.    No friction rub. No gallop.  Pulmonary:     Effort: Pulmonary effort is  normal. No accessory muscle usage or respiratory distress.     Breath sounds: Normal breath sounds.  Chest:     Chest wall: No tenderness.  Abdominal:     General: Bowel sounds are normal. There is no distension.     Palpations: Abdomen is soft.     Tenderness: There is no abdominal tenderness. There is no guarding.     Comments: No abd contusion or bruising.   Genitourinary:    Comments: No cva tenderness. Musculoskeletal:        General: No swelling.     Cervical back: Normal range of motion and neck supple. No rigidity or tenderness.     Comments: CTLS spine, non tender, aligned, no step off. Contusion/abrasion left elbow, otherwise good rom bil extremities without pain or focal bony tenderness.   Skin:    General: Skin is warm and dry.     Findings: No rash.  Neurological:     Mental Status: He is alert.     Comments: Alert, speech clear. GCS 15. Motor/sens grossly intact bil.   Psychiatric:        Mood and Affect: Mood normal.     ED Results / Procedures / Treatments   Labs (all labs ordered are listed, but only abnormal results are displayed) Results for orders placed or performed during the hospital encounter of 02/14/23  Basic metabolic panel  Result Value Ref Range   Sodium 138 135 - 145 mmol/L   Potassium 3.1 (L) 3.5 - 5.1 mmol/L   Chloride 104 98 - 111 mmol/L   CO2 29 22 - 32 mmol/L   Glucose, Bld 91 70 - 99 mg/dL   BUN 10 8 - 23 mg/dL   Creatinine, Ser 4.69 0.61 - 1.24 mg/dL   Calcium 9.5 8.9 - 62.9 mg/dL   GFR, Estimated >52 >84 mL/min   Anion gap 5 5 - 15  CBC  Result Value Ref Range   WBC 5.6 4.0 - 10.5 K/uL   RBC 4.64 4.22 - 5.81 MIL/uL   Hemoglobin 12.9 (L) 13.0 - 17.0 g/dL   HCT 13.2 44.0 - 10.2 %   MCV 88.6 80.0 - 100.0 fL   MCH 27.8 26.0 - 34.0 pg   MCHC 31.4 30.0 - 36.0 g/dL   RDW 72.5 36.6 - 44.0 %   Platelets 176 150 - 400 K/uL   nRBC 0.0 0.0 - 0.2 %  Protime-INR  Result Value Ref Range   Prothrombin Time 20.8 (H) 11.4 - 15.2 seconds    INR 1.8 (H) 0.8 - 1.2  POC CBG, ED  Result Value Ref Range  Glucose-Capillary 89 70 - 99 mg/dL      EKG None  Radiology DG ELBOW COMPLETE LEFT (3+VIEW)  Result Date: 02/14/2023 CLINICAL DATA:  Larey Seat onto left side. Skin tear to left elbow. Dementia. EXAM: LEFT ELBOW - COMPLETE 3+ VIEW COMPARISON:  Radiographs 09/12/2019 FINDINGS: No acute fracture or dislocation. No elbow joint effusion. Soft tissues are radiographically unremarkable. IMPRESSION: No acute fracture or dislocation. Electronically Signed   By: Minerva Fester M.D.   On: 02/14/2023 21:39   CT Head Wo Contrast  Result Date: 02/14/2023 CLINICAL DATA:  Trauma/fall EXAM: CT HEAD WITHOUT CONTRAST TECHNIQUE: Contiguous axial images were obtained from the base of the skull through the vertex without intravenous contrast. RADIATION DOSE REDUCTION: This exam was performed according to the departmental dose-optimization program which includes automated exposure control, adjustment of the mA and/or kV according to patient size and/or use of iterative reconstruction technique. COMPARISON:  05/19/2022 FINDINGS: Brain: No evidence of acute infarction, hemorrhage, hydrocephalus, extra-axial collection or mass lesion/mass effect. Subcortical white matter and periventricular small vessel ischemic changes. Vascular: Mild intracranial atherosclerosis. Skull: Normal. Negative for fracture or focal lesion. Sinuses/Orbits: The visualized paranasal sinuses are essentially clear. The mastoid air cells are unopacified. Other: None. IMPRESSION: No acute intracranial abnormality. Small vessel ischemic changes. Electronically Signed   By: Charline Bills M.D.   On: 02/14/2023 21:26    Procedures Procedures    Medications Ordered in ED Medications  Tdap (BOOSTRIX) injection 0.5 mL (0.5 mLs Intramuscular Given 02/14/23 2056)  potassium chloride SA (KLOR-CON M) CR tablet 40 mEq (40 mEq Oral Given 02/14/23 2147)  acetaminophen (TYLENOL) tablet 1,000 mg  (1,000 mg Oral Given 02/14/23 2147)    ED Course/ Medical Decision Making/ A&P                                 Medical Decision Making Problems Addressed: Abrasion: acute illness or injury Chronic dementia [facility]): chronic illness or injury with exacerbation, progression, or side effects of treatment that poses a threat to life or bodily functions Contusion of left elbow, initial encounter: acute illness or injury Elevated blood pressure reading: acute illness or injury Essential hypertension: chronic illness or injury with exacerbation, progression, or side effects of treatment that poses a threat to life or bodily functions Fall from slip, trip, or stumble, initial encounter: acute illness or injury with systemic symptoms that poses a threat to life or bodily functions On anticoagulant therapy: chronic illness or injury  Amount and/or Complexity of Data Reviewed Independent Historian: EMS    Details: hx External Data Reviewed: notes. Labs: ordered. Decision-making details documented in ED Course. Radiology: ordered and independent interpretation performed. Decision-making details documented in ED Course.  Risk OTC drugs. Prescription drug management. Decision regarding hospitalization.   Iv ns. Continuous pulse ox and cardiac monitoring. Labs ordered/sent. Imaging ordered.   Differential diagnosis includes head injury/sdh, fx, etc. Dispo decision including potential need for admission considered - will get labs and imaging and reassess.   Reviewed nursing notes and prior charts for additional history. External reports reviewed. Additional history from: EMS.   Tetanus IM. Acetaminophen po.   Cardiac monitor: sinus rhythm, rate 82.  Labs reviewed/interpreted by me - k mildly low, kcl po, otherwise chem normal. Wbc normal. Hct normal.   Xrays reviewed/interpreted by me - no fx.   CT reviewed/interpreted by me - no hem.   Po fluids/food.   Recheck pt, alert, content, no  pain, distress or acute symptoms.  Pt currently appears stable for d/c.   Rec close pcp f/u.  Return precautions provided.          Final Clinical Impression(s) / ED Diagnoses Final diagnoses:  None    Rx / DC Orders ED Discharge Orders     None         Cathren Laine, MD 02/14/23 2239

## 2023-02-26 ENCOUNTER — Ambulatory Visit: Payer: Medicare Other | Admitting: Physician Assistant

## 2023-04-19 NOTE — Progress Notes (Deleted)
Cardiology Office Note    Date:  04/19/2023   ID:  Matthew Edwards, DOB January 10, 1941, MRN [id number]  PCP:  No primary care provider on file.  Cardiologist:  Dr. [place] [facility]: Dr. Tresa Endo   No chief complaint on file.   History of Present Illness:  Matthew Edwards is a 82 y.o. male is seen for follow up CAD. Last seen in Sept 2021. He has a  PMH of CAD s/p stent to LAD and LCx, chronic atrial fibrillation on Coumadin, COPD, GERD, hypertension, hyperlipidemia, DM II, severe OSA and s/p AVR. In 2012, he underwent aortic valve replacement with #73mm Edwards pericardial valve and had LIMA to LAD CABG. Last cardiac catheterization performed in January 2012 prior to CABG and valvular replacement surgery shows patent LAD stent with 40% stenosis proximal to the stent, patent left circumflex stent. He was initially placed on CPAP, this was later transitioned to BiPAP.   In 2021 he was admitted on a couple of occasions with falls and altered mental status.He had Afib with RVR managed initially with diltiazem then transitioned to metoprolol. Echo showed normal EF. There was moderate mitral stenosis with mild MR. AV prosthesis was functioning well. Severe biatrial enlargement. Moderate pulmonary HTN. He was DC to SNF but did not stay long. Returned home with his daughter. She has been giving lasix 40 mg bid for the past 10 days. Weight down 12 lbs since then. No dyspnea or chest pain. INR therapeutic.      Past Medical History:  Diagnosis Date   Arthritis    Atrial fibrillation (HCC)    CAD (coronary artery disease) 2012   [place]-LAD 2 or 3 srents   Complication of anesthesia    "loopy after polpy removal dec 2017, lasted several days"   COPD (chronic obstructive pulmonary disease) (HCC)    Dementia (HCC)    GERD (gastroesophageal reflux disease)    HEART FAILURE, CONGESTIVE UNSPEC 02/23/2007   Qualifier: Diagnosis of  By: Ludwig Clarks MD, [facility]     Hyperlipidemia    Hypertension    OSA  (obstructive sleep apnea) 11/13/2014   S/P AVR    #25 mm Edwards pericardial valve Bioprosthetic. Dr Laneta Simmers.   Situational depression    "son passed 11/2013"   Sleep apnea    occ uses c pap does not know settings    Stented coronary artery 2013   TOBACCO DEPENDENCE 07/29/2006   Qualifier: History of  By: Sandi Mealy  MD, Stephanie     Type II diabetes mellitus [facility])     Past Surgical History:  Procedure Laterality Date   AORTIC VALVE REPLACEMENT  07/2010   notes 07/28/2010   CARDIAC CATHETERIZATION  06/2010   Hattie Perch 07/01/2010   COLONOSCOPY WITH PROPOFOL N/A 02/08/2017   Procedure: COLONOSCOPY WITH PROPOFOL;  Surgeon: Sherrilyn Rist, MD;  Location: [facility];  Service: Gastroenterology;  Laterality: N/A;   CORONARY ANGIOPLASTY WITH STENT PLACEMENT  2006; 10/2006   "2"; 1/notes 10/01/2010   CORONARY ARTERY BYPASS GRAFT  07/2010   "1"/notes 07/28/2010   PARTIAL PROCTECTOMY BY TEM N/A 05/26/2016   Procedure: TEM PARTIAL PROCTECTOMY OF RECTAL MASS;  Surgeon: Karie Soda, MD;  Location: [facility] ORS;  Service: General;  Laterality: N/A;    Current Medications: Outpatient Medications Prior to Visit  Medication Sig Dispense Refill   atorvastatin (LIPITOR) 40 MG tablet TAKE 1 TABLET BY MOUTH  DAILY AT 6:00 PM  No further refills until patient has office visit Thanks 30 tablet 0  budesonide-formoterol (SYMBICORT) 80-4.5 MCG/ACT inhaler Inhale 2 puffs into the lungs 2 (two) times daily. 3 g 0   donepezil (ARICEPT) 5 MG tablet TAKE 1 TABLET BY MOUTH AT  BEDTIME (Patient taking differently: Take 5 mg by mouth at bedtime.) 90 tablet 3   furosemide (LASIX) 20 MG tablet TAKE 1 TABLET BY MOUTH DAILY (Patient taking differently: Take 20 mg by mouth daily.) 90 tablet 3   gabapentin (NEURONTIN) 100 MG capsule TAKE 1 CAPSULE BY MOUTH AT  BEDTIME (Patient taking differently: Take 100 mg by mouth at bedtime.) 100 capsule 2   loperamide (IMODIUM A-D) 2 MG tablet Take 1 tablet (2 mg total) by mouth 4 (four) times daily  as needed for diarrhea or loose stools. 90 tablet 3   metoprolol succinate (TOPROL-XL) 100 MG 24 hr tablet TAKE 1 TABLET BY MOUTH DAILY (Patient taking differently: Take 100 mg by mouth daily.) 90 tablet 3   potassium chloride SA (KLOR-CON M) 20 MEQ tablet One po bid x 2 days, then one po once a day 20 tablet 0   potassium chloride SA (KLOR-CON) 20 MEQ tablet Take 1 tablet (20 mEq total) by mouth daily. 90 tablet 1   rivaroxaban (XARELTO) 20 MG TABS tablet Take 1 tablet (20 mg total) by mouth daily with supper. With a meal 90 tablet 1   tamsulosin (FLOMAX) 0.4 MG CAPS capsule Take 1 capsule (0.4 mg total) by mouth daily. 90 capsule 3   tiZANidine (ZANAFLEX) 2 MG tablet Take 2 mg by mouth at bedtime as needed for muscle spasms.     traZODone (DESYREL) 50 MG tablet Take 50 mg by mouth at bedtime.     VITAMIN D PO Take 1 tablet by mouth daily.     warfarin (COUMADIN) 5 MG tablet Take 5 mg by mouth daily.     No facility-administered medications prior to visit.     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   Exercise: Pt has not regular  exercise routine.   Seatbelts: Pt reports wearing seatbelt when in vehile.   Wynelle Link Exposure/Protection:    Hobbies: fishing, tv      Daughter Jiovonni Goggins [phone] - main caretaker currently    Lucy Sacre    Social Determinants of Health   Financial Resource Strain: Low Risk  (12/09/2022)   Received from [facility]   Overall Financial Resource Strain (CARDIA)    Difficulty of Paying Living  Expenses: Not hard at all  Food Insecurity: No Food Insecurity (12/09/2022)   [facility]   Hunger Vital Sign    Worried About Running Out of Food in the Last Year: Never true    Ran Out of Food in the Last Year: Never true  Transportation Needs: No Transportation Needs (12/09/2022)   Received from [facility] - Transportation    Lack of Transportation (Medical): No    Lack of Transportation (Non-Medical): No  Physical Activity: Not on file  Stress: No Stress Concern Present (03/22/2022)   Received from [facility], [facility] - Occupational Stress Questionnaire    Feeling of Stress : Not at all  Social Connections: Unknown (03/19/2022)   [facility] from [facility]   Social Network    Social Network: Not on file     Family History:  The patient's family history includes Heart disease in his mother.   ROS:   Please see the history of present illness.    ROS All other systems reviewed and are negative.   PHYSICAL EXAM:   VS:  There were no vitals taken for this visit.   GENERAL:  Well appearing, obese BM in NAD HEENT:  PERRL, EOMI, sclera are clear. Oropharynx is clear. NECK:  No jugular venous distention, carotid upstroke brisk and symmetric, no bruits, no thyromegaly or adenopathy LUNGS:  Clear to auscultation bilaterally CHEST:  Unremarkable HEART: IRRR,  PMI not displaced or sustained,S1 and S2 within normal limits, no S3, no S4: no clicks, no rubs, no murmurs ABD:  Soft, nontender. BS  +, no masses or bruits. No hepatomegaly, no splenomegaly EXT:  2 + pulses throughout, trace edema, no cyanosis no clubbing SKIN:  Warm and dry.  No rashes NEURO:  Alert and oriented x 3. Cranial nerves II through XII intact. PSYCH:  Cognitively intact    Wt Readings from Last 3 Encounters:  03/09/22 162 lb 14.7 oz (73.9 kg)  12/30/21 163 lb (73.9 kg)  07/08/21 184 lb 3.2 oz (83.6 kg)      Studies/Labs Reviewed:   EKG:  EKG is not ordered today.    Recent Labs: 02/14/2023: BUN 10; Creatinine, Ser 0.71; Hemoglobin 12.9; Platelets 176; Potassium 3.1; Sodium 138   Lipid Panel    Component Value Date/Time   CHOL 93 01/04/2020 0642   TRIG 67 01/04/2020 0642   HDL 39 (L) 01/04/2020 0642   CHOLHDL 2.4 01/04/2020 0642   VLDL 13 01/04/2020 0642   LDLCALC 41 01/04/2020 0642   LDLDIRECT 81 12/23/2012 1638    Echo 01/07/20: IMPRESSIONS     1. Left ventricular ejection fraction, by estimation, is 60 to 65%. The  left ventricle has normal function. Left ventricular endocardial border  not optimally defined to evaluate regional wall motion. There is mild  asymmetric left ventricular hypertrophy   of the septal segment. Left ventricular diastolic parameters are  indeterminate.   2. Right ventricular systolic function is moderately reduced. The right  ventricular size is normal. There is moderately elevated pulmonary artery  systolic pressure. The estimated right ventricular systolic pressure is  46.2 mmHg.   3. Left atrial size was severely dilated.   4. Right atrial size was severely dilated.   5. The mitral valve is degenerative. Mild mitral valve regurgitation.  Moderate mitral stenosis. The mean mitral valve gradient is 7.0 mmHg with  average heart rate of 97 bpm.   6. Tricuspid  valve regurgitation is moderate to severe.   7. The aortic valve has been repaired/replaced. Aortic valve  regurgitation is not visualized. There is a 25 mm Edwards pericardial  valve present in the  aortic position. Procedure Date: 07/28/2010. Aortic  valve mean gradient measures 2.7 mmHg.   8. The inferior vena cava is dilated in size with >50% respiratory  variability, suggesting right atrial pressure of 8 mmHg.   ASSESSMENT:    No diagnosis found.   PLAN:  In order of problems listed above:  Afib chronic. Rate well controlled on metoprolol. On coumadin. INR followed by primary care. Coumadin has been therapeutic. I would favor continued coumadin therapy and not DOAC due to mitral stenosis.   CAD s/p CABG: Last cardiac catheterization in 2012, stable anatomy at the time with patent stent in left circumflex and LAD. Underwent LIMA to LAD bypass surgery during valvular surgery. He is asymptomatic.  Hypertension: Blood pressure is well controlled today  Hyperlipidemia: On Lipitor daily. Labs followed by primary care.  DM II: On glipizide   S/p aortic valve replacement: Last echo August showed stable anatomy with bioprosthetic aortic valve.   Edema- weight is down 12 lbs. Will reduce lasix to 40 mg once a day with extra lasix as needed for weight gain of 2-3 lbs.  Continue Rx and sodium restriction.  OSA. Unable to tolerate CPAP  9.   Mitral stenosis. Moderate. Not a surgical candidate.  10. Pulmonary HTN    Medication Adjustments/Labs and Tests Ordered: Current medicines are reviewed at length with the patient today.  Concerns regarding medicines are outlined above.  Medication changes, Labs and Tests ordered today are listed in the Patient Instructions below. There are no Patient Instructions on file for this visit.   Signed, Arbell Wycoff [place], MD  04/19/2023 8:29 AM    [facility] [address] [place], [place], [place]  [address] Phone: [phone]; Fax: [phone]

## 2023-04-20 ENCOUNTER — Ambulatory Visit: Payer: Medicare Other | Attending: Student | Admitting: Cardiology

## 2023-06-14 ENCOUNTER — Ambulatory Visit: Payer: Medicare Other | Admitting: Podiatry

## 2023-06-14 ENCOUNTER — Ambulatory Visit: Payer: No Typology Code available for payment source | Admitting: Podiatry

## 2023-06-16 ENCOUNTER — Emergency Department (HOSPITAL_BASED_OUTPATIENT_CLINIC_OR_DEPARTMENT_OTHER)
Admission: EM | Admit: 2023-06-16 | Discharge: 2023-06-16 | Disposition: A | Discharge status: Against Medical Advice | Payer: No Typology Code available for payment source | Attending: Emergency Medicine | Admitting: Emergency Medicine

## 2023-06-16 ENCOUNTER — Encounter (HOSPITAL_BASED_OUTPATIENT_CLINIC_OR_DEPARTMENT_OTHER): Payer: Self-pay | Admitting: Emergency Medicine

## 2023-06-16 DIAGNOSIS — R799 Abnormal finding of blood chemistry, unspecified: Secondary | ICD-10-CM | POA: Diagnosis present

## 2023-06-16 DIAGNOSIS — Z5321 Procedure and treatment not carried out due to patient leaving prior to being seen by health care provider: Secondary | ICD-10-CM | POA: Insufficient documentation

## 2023-06-16 NOTE — ED Triage Notes (Signed)
 Pt referred by PCP, [facility], referred PT for low INR

## 2023-06-17 ENCOUNTER — Other Ambulatory Visit (HOSPITAL_COMMUNITY): Payer: Self-pay

## 2023-06-17 ENCOUNTER — Emergency Department (HOSPITAL_COMMUNITY)
Admission: EM | Admit: 2023-06-17 | Discharge: 2023-06-17 | Disposition: A | Discharge status: Home / Self Care | Payer: No Typology Code available for payment source | Attending: Emergency Medicine | Admitting: Emergency Medicine

## 2023-06-17 ENCOUNTER — Encounter (HOSPITAL_COMMUNITY): Payer: Self-pay | Admitting: Emergency Medicine

## 2023-06-17 ENCOUNTER — Other Ambulatory Visit: Payer: Self-pay

## 2023-06-17 DIAGNOSIS — I2510 Atherosclerotic heart disease of native coronary artery without angina pectoris: Secondary | ICD-10-CM | POA: Insufficient documentation

## 2023-06-17 DIAGNOSIS — Z7901 Long term (current) use of anticoagulants: Secondary | ICD-10-CM | POA: Diagnosis not present

## 2023-06-17 DIAGNOSIS — I509 Heart failure, unspecified: Secondary | ICD-10-CM | POA: Diagnosis not present

## 2023-06-17 DIAGNOSIS — J449 Chronic obstructive pulmonary disease, unspecified: Secondary | ICD-10-CM | POA: Diagnosis not present

## 2023-06-17 DIAGNOSIS — F0390 Unspecified dementia without behavioral disturbance: Secondary | ICD-10-CM | POA: Diagnosis not present

## 2023-06-17 DIAGNOSIS — Z79899 Other long term (current) drug therapy: Secondary | ICD-10-CM | POA: Insufficient documentation

## 2023-06-17 DIAGNOSIS — Z87891 Personal history of nicotine dependence: Secondary | ICD-10-CM | POA: Diagnosis not present

## 2023-06-17 DIAGNOSIS — E119 Type 2 diabetes mellitus without complications: Secondary | ICD-10-CM | POA: Diagnosis not present

## 2023-06-17 DIAGNOSIS — I110 Hypertensive heart disease with heart failure: Secondary | ICD-10-CM | POA: Insufficient documentation

## 2023-06-17 DIAGNOSIS — R791 Abnormal coagulation profile: Secondary | ICD-10-CM | POA: Diagnosis present

## 2023-06-17 LAB — CBC WITH DIFFERENTIAL/PLATELET
Abs Immature Granulocytes: 0.01 10*3/uL (ref 0.00–0.07)
Basophils Absolute: 0 10*3/uL (ref 0.0–0.1)
Basophils Relative: 1 %
Eosinophils Absolute: 0.1 10*3/uL (ref 0.0–0.5)
Eosinophils Relative: 2 %
HCT: 37 % — ABNORMAL LOW (ref 39.0–52.0)
Hemoglobin: 11.6 g/dL — ABNORMAL LOW (ref 13.0–17.0)
Immature Granulocytes: 0 %
Lymphocytes Relative: 19 %
Lymphs Abs: 1.1 10*3/uL (ref 0.7–4.0)
MCH: 28.4 pg (ref 26.0–34.0)
MCHC: 31.4 g/dL (ref 30.0–36.0)
MCV: 90.5 fL (ref 80.0–100.0)
Monocytes Absolute: 0.3 10*3/uL (ref 0.1–1.0)
Monocytes Relative: 6 %
Neutro Abs: 4.1 10*3/uL (ref 1.7–7.7)
Neutrophils Relative %: 72 %
Platelets: 178 10*3/uL (ref 150–400)
RBC: 4.09 MIL/uL — ABNORMAL LOW (ref 4.22–5.81)
RDW: 16.2 % — ABNORMAL HIGH (ref 11.5–15.5)
WBC: 5.7 10*3/uL (ref 4.0–10.5)
nRBC: 0 % (ref 0.0–0.2)

## 2023-06-17 LAB — BASIC METABOLIC PANEL
Anion gap: 8 (ref 5–15)
BUN: 12 mg/dL (ref 8–23)
CO2: 28 mmol/L (ref 22–32)
Calcium: 9.7 mg/dL (ref 8.9–10.3)
Chloride: 105 mmol/L (ref 98–111)
Creatinine, Ser: 0.84 mg/dL (ref 0.61–1.24)
GFR, Estimated: >60 mL/min (ref >60)
Glucose, Bld: 94 mg/dL (ref 70–99)
Potassium: 4.3 mmol/L (ref 3.5–5.1)
Sodium: 141 mmol/L (ref 135–145)

## 2023-06-17 LAB — PROTIME-INR
INR: 5.5 (ref 0.8–1.2)
Prothrombin Time: 50.3 s — ABNORMAL HIGH (ref 11.4–15.2)

## 2023-06-17 NOTE — ED Triage Notes (Signed)
Pt sent from PCP [facility] in [place] for recheck of pt/inr, pt takes for a-fib.

## 2023-06-17 NOTE — Progress Notes (Signed)
Education provided to patient and family. Discharge paperwork explained. VSS. Pt discharged via wheelchair.

## 2023-06-17 NOTE — Discharge Instructions (Addendum)
We evaluated you for your abnormal lab test. We repeated this test here in the ER and it was 5.9.   This level is still high, but is not dangerously high.  Please hold your Coumadin (warfarin) today.  Please call your primary doctor or your Coumadin clinic doctor tomorrow to discuss any changes in dosing.  Please return if you have any signs of bleeding such as head injury, confusion, blood in your stools, black stools, vomiting blood, coughing blood, or any other new symptoms.

## 2023-06-17 NOTE — ED Notes (Signed)
Date and time results received: 06/17/23 1355 (use smartphrase ".now" to insert current time)  Test: INR Critical Value: 5.5  Name of Provider Notified: [place] and Primary RN  Orders Received? Or Actions Taken?: Orders Received - See Orders for details

## 2023-06-17 NOTE — ED Provider Notes (Signed)
[place] EMERGENCY DEPARTMENT AT [facility] Provider Note  CSN: [id number] Arrival date & time: 06/17/23 1139  Chief Complaint(s) Abnormal Labs (Pt/Inr)  HPI Matthew Edwards is a 83 y.o. male with history of atrial fibrillation on warfarin presenting for recheck of INR.  He saw his primary care doctor today in the office.  They checked his INR and it was 10.  He is currently receiving 10 mg of warfarin every night.  His daughter helps manage his warfarin.  He denies any bleeding such as hematochezia, melena, hematemesis, hemoptysis, denies any injuries such as falls or head injuries, no confusion.  His primary care doctor wanted him to come to the ER to get it rechecked.   Past Medical History Past Medical History:  Diagnosis Date   Arthritis    Atrial fibrillation (HCC)    CAD (coronary artery disease) 2012   Lima-LAD 2 or 3 srents   Complication of anesthesia    "loopy after polpy removal dec 2017, lasted several days"   COPD (chronic obstructive pulmonary disease) (HCC)    Dementia (HCC)    GERD (gastroesophageal reflux disease)    HEART FAILURE, CONGESTIVE UNSPEC 02/23/2007   Qualifier: Diagnosis of  By: Ludwig Clarks MD, [facility]     Hyperlipidemia    Hypertension    OSA (obstructive sleep apnea) 11/13/2014   S/P AVR    #25 mm Edwards pericardial valve Bioprosthetic. Dr Laneta Simmers.   Situational depression    "son passed 11/2013"   Sleep apnea    occ uses c pap does not know settings    Stented coronary artery 2013   TOBACCO DEPENDENCE 07/29/2006   Qualifier: History of  By: Sandi Mealy  MD, Stephanie     Type II diabetes mellitus [facility])    Patient Active Problem List   Diagnosis Date Noted   Dysuria 08/13/2020   Lower extremity weakness 03/18/2020   Incontinence, feces 03/12/2020   Cerumen impaction 11/23/2019   Weight loss 11/21/2019   Adenomatous rectal polyp with high grade dysplasia s/p TEM resection 05/26/2016 05/26/2016   Spinal stenosis of lumbar region 11/28/2015    OSA (obstructive sleep apnea) 11/13/2014   Pulmonary HTN (HCC) 08/24/2014   Acute on chronic congestive heart failure with left ventricular diastolic dysfunction (HCC) 08/02/2014   Cognitive impairment 07/02/2014   S/P AVR 12/29/2012   Atrial fibrillation (HCC)    Type 2 diabetes mellitus with diabetic neuropathy, unspecified (HCC) 11/25/2011   Vitamin D deficiency 02/21/2010   Hypercalcemia 05/29/2009   Essential hypertension 02/23/2007   Coronary atherosclerosis 12/15/2006   GERD 12/15/2006   COPD (chronic obstructive pulmonary disease) (HCC) 12/10/2006   HLD (hyperlipidemia) 07/29/2006   Alcohol abuse 07/29/2006   Aortic valve disorder 07/29/2006   Home Medication(s) Prior to Admission medications   Medication Sig Start Date End Date Taking? Authorizing Provider  QUEtiapine (SEROQUEL) 50 MG tablet Take 50 mg by mouth daily. 03/05/23  Yes [provider]  atorvastatin (LIPITOR) 40 MG tablet TAKE 1 TABLET BY MOUTH  DAILY AT 6:00 PM  No further refills until patient has office visit Thanks 07/16/22   Carney Living, MD  budesonide-formoterol (SYMBICORT) 80-4.5 MCG/ACT inhaler Inhale 2 puffs into the lungs 2 (two) times daily. 02/07/21   Chambliss, Estill Batten, MD  donepezil (ARICEPT) 5 MG tablet TAKE 1 TABLET BY MOUTH AT  BEDTIME Patient taking differently: Take 5 mg by mouth at bedtime. 07/07/21   Carney Living, MD  furosemide (LASIX) 20 MG tablet TAKE 1 TABLET  BY MOUTH DAILY Patient taking differently: Take 20 mg by mouth daily. 08/11/21   Carney Living, MD  gabapentin (NEURONTIN) 100 MG capsule TAKE 1 CAPSULE BY MOUTH AT  BEDTIME Patient taking differently: Take 100 mg by mouth at bedtime. 09/15/21   Carney Living, MD  loperamide (IMODIUM A-D) 2 MG tablet Take 1 tablet (2 mg total) by mouth 4 (four) times daily as needed for diarrhea or loose stools. 06/10/21   Carney Living, MD  metoprolol succinate (TOPROL-XL) 100 MG 24 hr tablet TAKE 1  TABLET BY MOUTH DAILY Patient taking differently: Take 100 mg by mouth daily. 07/21/21   Carney Living, MD  nitroGLYCERIN (NITROSTAT) 0.4 MG SL tablet Place 0.4 mg under the tongue every 5 (five) minutes as needed for chest pain. 06/15/23   [provider]  potassium chloride SA (KLOR-CON) 20 MEQ tablet Take 1 tablet (20 mEq total) by mouth daily. 04/08/21   Carney Living, MD  rivaroxaban (XARELTO) 20 MG TABS tablet Take 1 tablet (20 mg total) by mouth daily with supper. With a meal 08/01/21   Carney Living, MD  tamsulosin (FLOMAX) 0.4 MG CAPS capsule Take 1 capsule (0.4 mg total) by mouth daily. 04/08/21   Carney Living, MD  tiZANidine (ZANAFLEX) 2 MG tablet Take 2 mg by mouth at bedtime as needed for muscle spasms. 12/04/21   [provider]  traZODone (DESYREL) 50 MG tablet Take 50 mg by mouth at bedtime. 02/24/22   [provider]  VITAMIN D PO Take 1 tablet by mouth daily.    [provider]  warfarin (COUMADIN) 5 MG tablet Take 5 mg by mouth daily.    [provider]                                                                                                                                    Past Surgical History Past Surgical History:  Procedure Laterality Date   AORTIC VALVE REPLACEMENT  07/2010   notes 07/28/2010   CARDIAC CATHETERIZATION  06/2010   Hattie Perch 07/01/2010   COLONOSCOPY WITH PROPOFOL N/A 02/08/2017   Procedure: COLONOSCOPY WITH PROPOFOL;  Surgeon: Sherrilyn Rist, MD;  Location: [facility] ENDOSCOPY;  Service: Gastroenterology;  Laterality: N/A;   CORONARY ANGIOPLASTY WITH STENT PLACEMENT  2006; 10/2006   "2"; 1/notes 10/01/2010   CORONARY ARTERY BYPASS GRAFT  07/2010   "1"/notes 07/28/2010   PARTIAL PROCTECTOMY BY TEM N/A 05/26/2016   Procedure: TEM PARTIAL PROCTECTOMY OF RECTAL MASS;  Surgeon: Karie Soda, MD;  Location: [facility] ORS;  Service: General;  Laterality: N/A;   Family History Family History  Problem  Relation Age of Onset   Heart disease Mother    Colon cancer Neg Hx    Esophageal cancer Neg Hx    Stomach cancer Neg Hx    Pancreatic cancer Neg Hx    Liver disease Neg Hx  Colon polyps Neg Hx     Social History Social History   Tobacco Use   Smoking status: Former    Current packs/day: 0.50    Average packs/day: 0.5 packs/day for 46.0 years (23.0 ttl pk-yrs)    Types: Cigarettes   Smokeless tobacco: Never  Vaping Use   Vaping status: Never Used  Substance Use Topics   Alcohol use: No   Drug use: No   Allergies Pineapple concentrate  Review of Systems Review of Systems  All other systems reviewed and are negative.   Physical Exam Vital Signs  I have reviewed the triage vital signs BP (!) 154/73 (BP Location: Right Arm)   Pulse 93   Temp 97.6 F (36.4 C) (Oral)   Resp 18   Ht 5\' 9"  (1.753 m)   Wt 73.9 kg   SpO2 95%   BMI 24.06 kg/m  Physical Exam Vitals and nursing note reviewed.  Constitutional:      General: He is not in acute distress.    Appearance: Normal appearance.  HENT:     Mouth/Throat:     Mouth: Mucous membranes are moist.  Eyes:     Conjunctiva/sclera: Conjunctivae normal.  Cardiovascular:     Rate and Rhythm: Normal rate and regular rhythm.  Pulmonary:     Effort: Pulmonary effort is normal. No respiratory distress.     Breath sounds: Normal breath sounds.  Abdominal:     General: Abdomen is flat.     Palpations: Abdomen is soft.     Tenderness: There is no abdominal tenderness.  Musculoskeletal:     Right lower leg: No edema.     Left lower leg: No edema.  Skin:    General: Skin is warm and dry.     Capillary Refill: Capillary refill takes less than 2 seconds.  Neurological:     Mental Status: He is alert and oriented to person, place, and time. Mental status is at baseline.  Psychiatric:        Mood and Affect: Mood normal.        Behavior: Behavior normal.     ED Results and Treatments Labs (all labs ordered are  listed, but only abnormal results are displayed) Labs Reviewed  CBC WITH DIFFERENTIAL/PLATELET - Abnormal; Notable for the following components:      Result Value   RBC 4.09 (*)    Hemoglobin 11.6 (*)    HCT 37.0 (*)    RDW 16.2 (*)    All other components within normal limits  PROTIME-INR - Abnormal; Notable for the following components:   Prothrombin Time 50.3 (*)    INR 5.5 (*)    All other components within normal limits  BASIC METABOLIC PANEL                                                                                                                          Radiology No results found.  Pertinent labs & imaging results that were  available during my care of the patient were reviewed by me and considered in my medical decision making (see MDM for details).  Medications Ordered in ED Medications - No data to display                                                                                                                                   Procedures Procedures  (including critical care time)  Medical Decision Making / ED Course   MDM:  83 year old presenting to the emergency department for abnormal lab.  Patient is overall well-appearing, here is warfarin is 5.5.  He denies any bleeding episodes.  I advised the patient to hold his warfarin for today.  I advised him to call his primary doctor tomorrow for further warfarin management.  I discussed that he should return immediately for any signs of bleeding.  Patient and his family are comfortable with this plan.      Additional history obtained: -Additional history obtained from family    Lab Tests: -I ordered, reviewed, and interpreted labs.   The pertinent results include:   Labs Reviewed  CBC WITH DIFFERENTIAL/PLATELET - Abnormal; Notable for the following components:      Result Value   RBC 4.09 (*)    Hemoglobin 11.6 (*)    HCT 37.0 (*)    RDW 16.2 (*)    All other components within normal limits   PROTIME-INR - Abnormal; Notable for the following components:   Prothrombin Time 50.3 (*)    INR 5.5 (*)    All other components within normal limits  BASIC METABOLIC PANEL    Notable for elevated INR    Medicines ordered and prescription drug management: No orders of the defined types were placed in this encounter.   -I have reviewed the patients home medicines and have made adjustments as needed   Co morbidities that complicate the patient evaluation  Past Medical History:  Diagnosis Date   Arthritis    Atrial fibrillation (HCC)    CAD (coronary artery disease) 2012   Lima-LAD 2 or 3 srents   Complication of anesthesia    "loopy after polpy removal dec 2017, lasted several days"   COPD (chronic obstructive pulmonary disease) (HCC)    Dementia (HCC)    GERD (gastroesophageal reflux disease)    HEART FAILURE, CONGESTIVE UNSPEC 02/23/2007   Qualifier: Diagnosis of  By: Ludwig Clarks MD, [facility]     Hyperlipidemia    Hypertension    OSA (obstructive sleep apnea) 11/13/2014   S/P AVR    #25 mm Edwards pericardial valve Bioprosthetic. Dr Laneta Simmers.   Situational depression    "son passed 11/2013"   Sleep apnea    occ uses c pap does not know settings    Stented coronary artery 2013   TOBACCO DEPENDENCE 07/29/2006   Qualifier: History of  By: Sandi Mealy  MD, Stephanie     Type II diabetes mellitus (HCC)  Dispostion: Disposition decision including need for hospitalization was considered, and patient discharged from emergency department.    Final Clinical Impression(s) / ED Diagnoses Final diagnoses:  Elevated INR     This chart was dictated using voice recognition software.  Despite best efforts to proofread,  errors can occur which can change the documentation meaning.    Lonell Grandchild, MD 06/17/23 [phone]

## 2023-06-23 ENCOUNTER — Ambulatory Visit: Payer: Self-pay | Admitting: Podiatry

## 2023-07-05 ENCOUNTER — Ambulatory Visit: Payer: Self-pay | Admitting: Podiatry

## 2023-07-08 ENCOUNTER — Telehealth: Payer: Self-pay | Admitting: Family Medicine

## 2023-07-08 NOTE — Telephone Encounter (Signed)
**  After Hours/ Emergency Line Call**  Received a page to call [phone]) - [phone].  Patient: Matthew Edwards  Caller:  Daughter Confirmed name & DOB of patient with caller  Subjective:   Received call from daughter.  Patient used to see [facility] (Dr. Jeanelle) but recently was trying to switch to [facility] but has not been able to see them yet. Therefore she called our after-hours line.  Patient is on warfarin 5mg  daily for atrial fibrillation.  Daughter reports that the patient was seen 1/16 in [facility] ED and was found to have elevated INR 5.5.  Confirmed this via chart review.  They were discharged and encouraged to hold warfarin and follow-up with PCP.  Daughter reports that after the ED visit on 1/16, she held the patient's warfarin for 1 week.  On 1/24, restarted at 2.5 mg (1/2 tablet) daily which is what he is currently taking.  However, given that the patient has not been able to get in with his new PCP yet she called to ask for recommendations.  Patient has not had any bleeding episodes including dark/tarry stools, hematuria, hematochezia, hematemesis.  He does not have any active wounds or new bruising.  He has not fallen recently.  He is awake, alert, and watching TV.  Apparently they do have an appointment scheduled with [facility] on 2/10.    Objective:  Observations: NAD, did not speak directly with patient but overheard his voice in the background. Per daughter: awake, alert, and watching TV.  Assessment & Plan  ODAY RIDINGS is a 83 y.o. male with PMHx s/f afib on warfarin who calls with the following complaints and concerns:   Elevated INR 5.5 noted 1/16, on warfarin 5mg  daily for afib - currently on 2.5 mg daily after holding for 1 week after 1/16 visit  Discussed in great detail with the patient's daughter that having an elevated INR puts the patient at great risk for bleeding especially if he were to fall  Given that patient's INR was  5.5 on 1/16 and has not been rechecked since then, with changes in his warfarin dosing over that time, I did recommend that the patient be seen in the ED or at least urgent care to check his INR in case it is more elevated and/or requires any warfarin reversal.  I am reassured that the patient has not had any signs of bleeding and appears asymptomatic.  They have a follow-up appointment scheduled on 2/10 with her new PCP.  I did review our [facility] schedule and unfortunately we do not have any appointments available before 2/10.  Discussed strict return precautions if patient is unable to visit the ED/urgent care prior to his visit on 2/10.  Advised that if the patient has any fall, new bruising, bleeding, dark stools, or change in his clinical status at all he should be evaluated promptly. Daughter states she will try to call new PCP tomorrow if they don't go to ED today.  Recommendations:  ED/urgent care to check INR Strict return precautions given if decided not to visit ED/UC  PCP appt scheduled with [facility] 2/10  -- Red flags discussed.   -- Will forward to PCP.  Payton Coward, MD [facility], PGY-1

## 2023-09-24 ENCOUNTER — Emergency Department (HOSPITAL_COMMUNITY)
Admission: EM | Admit: 2023-09-24 | Discharge: 2023-09-25 | Disposition: A | Discharge status: Home / Self Care | Attending: Emergency Medicine | Admitting: Emergency Medicine

## 2023-09-24 ENCOUNTER — Encounter (HOSPITAL_COMMUNITY): Payer: Self-pay

## 2023-09-24 ENCOUNTER — Other Ambulatory Visit: Payer: Self-pay

## 2023-09-24 DIAGNOSIS — J449 Chronic obstructive pulmonary disease, unspecified: Secondary | ICD-10-CM | POA: Diagnosis not present

## 2023-09-24 DIAGNOSIS — Z79899 Other long term (current) drug therapy: Secondary | ICD-10-CM | POA: Diagnosis not present

## 2023-09-24 DIAGNOSIS — I2510 Atherosclerotic heart disease of native coronary artery without angina pectoris: Secondary | ICD-10-CM | POA: Diagnosis not present

## 2023-09-24 DIAGNOSIS — S0081XA Abrasion of other part of head, initial encounter: Secondary | ICD-10-CM | POA: Insufficient documentation

## 2023-09-24 DIAGNOSIS — W19XXXA Unspecified fall, initial encounter: Secondary | ICD-10-CM

## 2023-09-24 DIAGNOSIS — Z7901 Long term (current) use of anticoagulants: Secondary | ICD-10-CM | POA: Diagnosis not present

## 2023-09-24 DIAGNOSIS — Y92009 Unspecified place in unspecified non-institutional (private) residence as the place of occurrence of the external cause: Secondary | ICD-10-CM | POA: Diagnosis not present

## 2023-09-24 DIAGNOSIS — E119 Type 2 diabetes mellitus without complications: Secondary | ICD-10-CM | POA: Diagnosis not present

## 2023-09-24 DIAGNOSIS — F0390 Unspecified dementia without behavioral disturbance: Secondary | ICD-10-CM | POA: Diagnosis not present

## 2023-09-24 DIAGNOSIS — I10 Essential (primary) hypertension: Secondary | ICD-10-CM | POA: Diagnosis not present

## 2023-09-24 DIAGNOSIS — R519 Headache, unspecified: Secondary | ICD-10-CM | POA: Diagnosis present

## 2023-09-24 DIAGNOSIS — S0990XA Unspecified injury of head, initial encounter: Secondary | ICD-10-CM

## 2023-09-24 DIAGNOSIS — T148XXA Other injury of unspecified body region, initial encounter: Secondary | ICD-10-CM

## 2023-09-24 LAB — BASIC METABOLIC PANEL WITH GFR
Anion gap: 9 (ref 5–15)
BUN: 8 mg/dL (ref 8–23)
CO2: 26 mmol/L (ref 22–32)
Calcium: 9.4 mg/dL (ref 8.9–10.3)
Chloride: 107 mmol/L (ref 98–111)
Creatinine, Ser: 0.86 mg/dL (ref 0.61–1.24)
GFR, Estimated: >60 mL/min (ref >60)
Glucose, Bld: 89 mg/dL (ref 70–99)
Potassium: 3.4 mmol/L — ABNORMAL LOW (ref 3.5–5.1)
Sodium: 142 mmol/L (ref 135–145)

## 2023-09-24 LAB — CBC
HCT: 39.6 % (ref 39.0–52.0)
Hemoglobin: 12.2 g/dL — ABNORMAL LOW (ref 13.0–17.0)
MCH: 27 pg (ref 26.0–34.0)
MCHC: 30.8 g/dL (ref 30.0–36.0)
MCV: 87.6 fL (ref 80.0–100.0)
Platelets: 210 10*3/uL (ref 150–400)
RBC: 4.52 MIL/uL (ref 4.22–5.81)
RDW: 16.1 % — ABNORMAL HIGH (ref 11.5–15.5)
WBC: 5.5 10*3/uL (ref 4.0–10.5)
nRBC: 0 % (ref 0.0–0.2)

## 2023-09-24 LAB — PROTIME-INR
INR: 2.9 — ABNORMAL HIGH (ref 0.8–1.2)
Prothrombin Time: 30.9 s — ABNORMAL HIGH (ref 11.4–15.2)

## 2023-09-24 LAB — CK: Total CK: 71 U/L (ref 49–397)

## 2023-09-24 NOTE — ED Notes (Signed)
 This nurse called lab asking if they could run the CK off of the previous labs sent, lab stated they could.
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
## 2023-09-24 NOTE — ED Triage Notes (Addendum)
 Pt BIB daughter for a fall yesterday. Lac to L forehead. Family member unsure how long pt laid on the floor. Pt denies LOC. Pt takes Coumadin .

## 2023-09-25 ENCOUNTER — Emergency Department (HOSPITAL_COMMUNITY)

## 2023-09-25 NOTE — ED Notes (Signed)
 Patient returned from CT

## 2023-09-25 NOTE — ED Notes (Signed)
 Wound care performed and dressing applied to laceration on patient's left forehead.

## 2023-09-25 NOTE — ED Notes (Signed)
 Patient transported to CT

## 2023-09-25 NOTE — Discharge Instructions (Addendum)
 You INR was 2.9.

## 2023-10-12 ENCOUNTER — Inpatient Hospital Stay (HOSPITAL_COMMUNITY)
Admission: EM | Admit: 2023-10-12 | Discharge: 2023-10-18 | DRG: 291 | Disposition: A | Discharge status: Skilled Nursing Facility | Attending: Family Medicine | Admitting: Family Medicine

## 2023-10-12 ENCOUNTER — Emergency Department (HOSPITAL_COMMUNITY)

## 2023-10-12 ENCOUNTER — Encounter (HOSPITAL_COMMUNITY): Payer: Self-pay | Admitting: Family Medicine

## 2023-10-12 DIAGNOSIS — I081 Rheumatic disorders of both mitral and tricuspid valves: Secondary | ICD-10-CM | POA: Diagnosis present

## 2023-10-12 DIAGNOSIS — I5033 Acute on chronic diastolic (congestive) heart failure: Secondary | ICD-10-CM | POA: Diagnosis present

## 2023-10-12 DIAGNOSIS — J449 Chronic obstructive pulmonary disease, unspecified: Secondary | ICD-10-CM | POA: Diagnosis present

## 2023-10-12 DIAGNOSIS — J9691 Respiratory failure, unspecified with hypoxia: Secondary | ICD-10-CM

## 2023-10-12 DIAGNOSIS — Z7901 Long term (current) use of anticoagulants: Secondary | ICD-10-CM

## 2023-10-12 DIAGNOSIS — H9190 Unspecified hearing loss, unspecified ear: Secondary | ICD-10-CM | POA: Diagnosis present

## 2023-10-12 DIAGNOSIS — E119 Type 2 diabetes mellitus without complications: Secondary | ICD-10-CM | POA: Diagnosis present

## 2023-10-12 DIAGNOSIS — I493 Ventricular premature depolarization: Secondary | ICD-10-CM | POA: Diagnosis present

## 2023-10-12 DIAGNOSIS — I110 Hypertensive heart disease with heart failure: Secondary | ICD-10-CM | POA: Diagnosis present

## 2023-10-12 DIAGNOSIS — D696 Thrombocytopenia, unspecified: Secondary | ICD-10-CM | POA: Diagnosis present

## 2023-10-12 DIAGNOSIS — Z7951 Long term (current) use of inhaled steroids: Secondary | ICD-10-CM | POA: Diagnosis not present

## 2023-10-12 DIAGNOSIS — Z87891 Personal history of nicotine dependence: Secondary | ICD-10-CM

## 2023-10-12 DIAGNOSIS — T501X6A Underdosing of loop [high-ceiling] diuretics, initial encounter: Secondary | ICD-10-CM | POA: Diagnosis present

## 2023-10-12 DIAGNOSIS — Z952 Presence of prosthetic heart valve: Secondary | ICD-10-CM | POA: Diagnosis not present

## 2023-10-12 DIAGNOSIS — F0390 Unspecified dementia without behavioral disturbance: Secondary | ICD-10-CM

## 2023-10-12 DIAGNOSIS — G4733 Obstructive sleep apnea (adult) (pediatric): Secondary | ICD-10-CM | POA: Diagnosis present

## 2023-10-12 DIAGNOSIS — I272 Pulmonary hypertension, unspecified: Secondary | ICD-10-CM | POA: Diagnosis present

## 2023-10-12 DIAGNOSIS — E785 Hyperlipidemia, unspecified: Secondary | ICD-10-CM | POA: Diagnosis present

## 2023-10-12 DIAGNOSIS — Z951 Presence of aortocoronary bypass graft: Secondary | ICD-10-CM

## 2023-10-12 DIAGNOSIS — R931 Abnormal findings on diagnostic imaging of heart and coronary circulation: Secondary | ICD-10-CM

## 2023-10-12 DIAGNOSIS — Z602 Problems related to living alone: Secondary | ICD-10-CM | POA: Diagnosis present

## 2023-10-12 DIAGNOSIS — Z91148 Patient's other noncompliance with medication regimen for other reason: Secondary | ICD-10-CM | POA: Diagnosis not present

## 2023-10-12 DIAGNOSIS — Z8249 Family history of ischemic heart disease and other diseases of the circulatory system: Secondary | ICD-10-CM

## 2023-10-12 DIAGNOSIS — Z953 Presence of xenogenic heart valve: Secondary | ICD-10-CM

## 2023-10-12 DIAGNOSIS — D151 Benign neoplasm of heart: Secondary | ICD-10-CM | POA: Diagnosis not present

## 2023-10-12 DIAGNOSIS — I2510 Atherosclerotic heart disease of native coronary artery without angina pectoris: Secondary | ICD-10-CM | POA: Diagnosis present

## 2023-10-12 DIAGNOSIS — Z955 Presence of coronary angioplasty implant and graft: Secondary | ICD-10-CM

## 2023-10-12 DIAGNOSIS — I509 Heart failure, unspecified: Principal | ICD-10-CM

## 2023-10-12 DIAGNOSIS — R32 Unspecified urinary incontinence: Secondary | ICD-10-CM | POA: Diagnosis present

## 2023-10-12 DIAGNOSIS — J9601 Acute respiratory failure with hypoxia: Secondary | ICD-10-CM | POA: Insufficient documentation

## 2023-10-12 DIAGNOSIS — I4821 Permanent atrial fibrillation: Secondary | ICD-10-CM | POA: Diagnosis present

## 2023-10-12 DIAGNOSIS — Z79899 Other long term (current) drug therapy: Secondary | ICD-10-CM

## 2023-10-12 DIAGNOSIS — D649 Anemia, unspecified: Secondary | ICD-10-CM | POA: Diagnosis present

## 2023-10-12 DIAGNOSIS — I10 Essential (primary) hypertension: Secondary | ICD-10-CM

## 2023-10-12 DIAGNOSIS — Z789 Other specified health status: Secondary | ICD-10-CM

## 2023-10-12 LAB — COMPREHENSIVE METABOLIC PANEL WITH GFR
ALT: 13 U/L (ref 0–44)
AST: 27 U/L (ref 15–41)
Albumin: 3.4 g/dL — ABNORMAL LOW (ref 3.5–5.0)
Alkaline Phosphatase: 65 U/L (ref 38–126)
Anion gap: 7 (ref 5–15)
BUN: 10 mg/dL (ref 8–23)
CO2: 30 mmol/L (ref 22–32)
Calcium: 9.4 mg/dL (ref 8.9–10.3)
Chloride: 108 mmol/L (ref 98–111)
Creatinine, Ser: 0.84 mg/dL (ref 0.61–1.24)
GFR, Estimated: >60 mL/min (ref >60)
Glucose, Bld: 84 mg/dL (ref 70–99)
Potassium: 3.9 mmol/L (ref 3.5–5.1)
Sodium: 145 mmol/L (ref 135–145)
Total Bilirubin: 1 mg/dL (ref 0.0–1.2)
Total Protein: 6.9 g/dL (ref 6.5–8.1)

## 2023-10-12 LAB — CBC WITH DIFFERENTIAL/PLATELET
Abs Immature Granulocytes: 0.01 10*3/uL (ref 0.00–0.07)
Basophils Absolute: 0 10*3/uL (ref 0.0–0.1)
Basophils Relative: 0 %
Eosinophils Absolute: 0.2 10*3/uL (ref 0.0–0.5)
Eosinophils Relative: 3 %
HCT: 40.7 % (ref 39.0–52.0)
Hemoglobin: 12.3 g/dL — ABNORMAL LOW (ref 13.0–17.0)
Immature Granulocytes: 0 %
Lymphocytes Relative: 25 %
Lymphs Abs: 1.3 10*3/uL (ref 0.7–4.0)
MCH: 27.2 pg (ref 26.0–34.0)
MCHC: 30.2 g/dL (ref 30.0–36.0)
MCV: 89.8 fL (ref 80.0–100.0)
Monocytes Absolute: 0.4 10*3/uL (ref 0.1–1.0)
Monocytes Relative: 8 %
Neutro Abs: 3.5 10*3/uL (ref 1.7–7.7)
Neutrophils Relative %: 64 %
Platelets: 156 10*3/uL (ref 150–400)
RBC: 4.53 MIL/uL (ref 4.22–5.81)
RDW: 17.3 % — ABNORMAL HIGH (ref 11.5–15.5)
WBC: 5.4 10*3/uL (ref 4.0–10.5)
nRBC: 0 % (ref 0.0–0.2)

## 2023-10-12 LAB — URINALYSIS, ROUTINE W REFLEX MICROSCOPIC
Bilirubin Urine: NEGATIVE
Glucose, UA: NEGATIVE mg/dL
Hgb urine dipstick: NEGATIVE
Ketones, ur: NEGATIVE mg/dL
Leukocytes,Ua: NEGATIVE
Nitrite: NEGATIVE
Protein, ur: NEGATIVE mg/dL
Specific Gravity, Urine: 1.008 (ref 1.005–1.030)
pH: 6 (ref 5.0–8.0)

## 2023-10-12 LAB — PROTIME-INR
INR: 3 — ABNORMAL HIGH (ref 0.8–1.2)
Prothrombin Time: 31.1 s — ABNORMAL HIGH (ref 11.4–15.2)

## 2023-10-12 LAB — RESP PANEL BY RT-PCR (RSV, FLU A&B, COVID)  RVPGX2
Influenza A by PCR: NEGATIVE
Influenza B by PCR: NEGATIVE
Resp Syncytial Virus by PCR: NEGATIVE
SARS Coronavirus 2 by RT PCR: NEGATIVE

## 2023-10-12 LAB — TROPONIN I (HIGH SENSITIVITY)
Troponin I (High Sensitivity): 10 ng/L (ref <18)
Troponin I (High Sensitivity): 9 ng/L (ref <18)

## 2023-10-12 LAB — BRAIN NATRIURETIC PEPTIDE: B Natriuretic Peptide: 176.2 pg/mL — ABNORMAL HIGH (ref 0.0–100.0)

## 2023-10-12 LAB — I-STAT CG4 LACTIC ACID, ED
Lactic Acid, Venous: 1 mmol/L (ref 0.5–1.9)
Lactic Acid, Venous: 1.6 mmol/L (ref 0.5–1.9)

## 2023-10-12 MED ORDER — WARFARIN - PHARMACIST DOSING INPATIENT: Freq: Every day | Status: DC

## 2023-10-12 MED ORDER — ACETAMINOPHEN 325 MG PO TABS: 650.0000 mg | ORAL_TABLET | Freq: Four times a day (QID) | ORAL | Status: DC | PRN

## 2023-10-12 MED ORDER — METOPROLOL SUCCINATE ER 100 MG PO TB24
100.0000 mg | ORAL_TABLET | Freq: Every day | ORAL | Status: DC
  Administered 2023-10-13 – 2023-10-16 (×4): 100 mg via ORAL
  Filled 2023-10-12 (×4): qty 1

## 2023-10-12 MED ORDER — ACETAMINOPHEN 650 MG RE SUPP: 650.0000 mg | Freq: Four times a day (QID) | RECTAL | Status: DC | PRN

## 2023-10-12 MED ORDER — FUROSEMIDE 10 MG/ML IJ SOLN
20.0000 mg | Freq: Once | INTRAMUSCULAR | Status: AC
  Administered 2023-10-12: 20 mg via INTRAVENOUS
  Filled 2023-10-12: qty 2

## 2023-10-12 MED ORDER — FLUTICASONE FUROATE-VILANTEROL 100-25 MCG/ACT IN AEPB
1.0000 | INHALATION_SPRAY | Freq: Every day | RESPIRATORY_TRACT | Status: DC
  Administered 2023-10-14 – 2023-10-18 (×5): 1 via RESPIRATORY_TRACT
  Filled 2023-10-12 (×3): qty 28

## 2023-10-12 MED ORDER — FUROSEMIDE 10 MG/ML IJ SOLN: 20.0000 mg | Freq: Once | INTRAMUSCULAR | Status: DC

## 2023-10-12 MED ORDER — DONEPEZIL HCL 10 MG PO TABS
10.0000 mg | ORAL_TABLET | Freq: Every day | ORAL | Status: DC
  Administered 2023-10-12 – 2023-10-17 (×6): 10 mg via ORAL
  Filled 2023-10-12 (×6): qty 1

## 2023-10-12 MED ORDER — DONEPEZIL HCL 10 MG PO TABS: 10.0000 mg | ORAL_TABLET | Freq: Every day | ORAL | Status: DC

## 2023-10-12 NOTE — Assessment & Plan Note (Signed)
 Has not been taking lasix  at home as prescribed per daughter. Does appear mildly volume up. BNP slightly elevated. Denies any difficulty breathing but is on supplemental O2 here (no O2 requirement at home). Last Echo 2021 with normal LVEF 60-65%. S/p IV lasix  20 mg in the ED. Suspect this is all due to deviation from lasix  regimen, but will continue workup as below. - Admit to FMTS, Attending Dr. Pray - Supplement O2 as needed, wean as tolerated - CCM x 24hrs - Strict I&Os - Daily Weight - Echo - PT/OT Eval - AM CBC, BMP - f/u blood cultures collected in ED - continue home metoprolol  100 mg daily - day team to redose lasix  as appropriate

## 2023-10-12 NOTE — ED Provider Notes (Signed)
 [place] EMERGENCY DEPARTMENT AT [facility] Provider Note   CSN: [id number] Arrival date & time: 10/12/23  1721     History  Chief Complaint  Patient presents with   Leg Swelling    Matthew Edwards is a 83 y.o. male.  Patient is an 83 year old male who presents to the emergency department by EMS secondary to increased drowsiness, shortness of breath, increased edema in lower extremities.  It is noted by the daughter that the patient has not been taking his Lasix  as directed.  Patient did have a fall a few days ago as well striking his head.  He denies any active headache at this time.  He has had no associated syncopal events.  There has been no abdominal pain, nausea, vomiting, diarrhea.  Patient denies any active chest pain at this time.        Home Medications Prior to Admission medications   Medication Sig Start Date End Date Taking? Authorizing Provider  atorvastatin  (LIPITOR) 40 MG tablet TAKE 1 TABLET BY MOUTH  DAILY AT 6:00 PM  No further refills until patient has office visit Thanks 07/16/22   Marveen Slick, MD  budesonide -formoterol  (SYMBICORT ) 80-4.5 MCG/ACT inhaler Inhale 2 puffs into the lungs 2 (two) times daily. 02/07/21   Marveen Slick, MD  donepezil  (ARICEPT ) 5 MG tablet TAKE 1 TABLET BY MOUTH AT  BEDTIME Patient taking differently: Take 5 mg by mouth at bedtime. 07/07/21   Marveen Slick, MD  furosemide  (LASIX ) 20 MG tablet TAKE 1 TABLET BY MOUTH DAILY Patient taking differently: Take 20 mg by mouth daily. 08/11/21   Marveen Slick, MD  gabapentin  (NEURONTIN ) 100 MG capsule TAKE 1 CAPSULE BY MOUTH AT  BEDTIME Patient taking differently: Take 100 mg by mouth at bedtime. 09/15/21   Marveen Slick, MD  loperamide  (IMODIUM  A-D) 2 MG tablet Take 1 tablet (2 mg total) by mouth 4 (four) times daily as needed for diarrhea or loose stools. 06/10/21   Marveen Slick, MD  metoprolol  succinate (TOPROL -XL) 100 MG 24 hr tablet  TAKE 1 TABLET BY MOUTH DAILY Patient taking differently: Take 100 mg by mouth daily. 07/21/21   Marveen Slick, MD  nitroGLYCERIN  (NITROSTAT ) 0.4 MG SL tablet Place 0.4 mg under the tongue every 5 (five) minutes as needed for chest pain. 06/15/23   [provider]  potassium chloride  SA (KLOR-CON ) 20 MEQ tablet Take 1 tablet (20 mEq total) by mouth daily. 04/08/21   Marveen Slick, MD  QUEtiapine  (SEROQUEL ) 50 MG tablet Take 50 mg by mouth daily. 03/05/23   [provider]  rivaroxaban  (XARELTO ) 20 MG TABS tablet Take 1 tablet (20 mg total) by mouth daily with supper. With a meal 08/01/21   Chambliss, Vesta Gourd, MD  tamsulosin  (FLOMAX ) 0.4 MG CAPS capsule Take 1 capsule (0.4 mg total) by mouth daily. 04/08/21   Marveen Slick, MD  tiZANidine (ZANAFLEX) 2 MG tablet Take 2 mg by mouth at bedtime as needed for muscle spasms. 12/04/21   [provider]  traZODone (DESYREL) 50 MG tablet Take 50 mg by mouth at bedtime. 02/24/22   [provider]  VITAMIN D  PO Take 1 tablet by mouth daily.    [provider]  warfarin (COUMADIN ) 5 MG tablet Take 5 mg by mouth daily.    [provider]      Allergies    Pineapple concentrate    Review of Systems   Review of Systems  Constitutional:  Positive for fatigue.  Respiratory:  Positive for shortness of breath.     Physical Exam Updated Vital Signs BP (!) 169/108   Pulse 70   Temp 98.2 F (36.8 C) (Oral)   Resp (!) 22   SpO2 100%  Physical Exam Vitals and nursing note reviewed.  Constitutional:      Appearance: Normal appearance.  HENT:     Head: Normocephalic and atraumatic.     Nose: Nose normal.     Mouth/Throat:     Mouth: Mucous membranes are moist.  Eyes:     Extraocular Movements: Extraocular movements intact.     Conjunctiva/sclera: Conjunctivae normal.     Pupils: Pupils are equal, round, and reactive to light.  Cardiovascular:     Rate and Rhythm: Normal rate and  regular rhythm.     Pulses: Normal pulses.     Heart sounds: Normal heart sounds. No murmur heard.    No gallop.  Pulmonary:     Effort: Pulmonary effort is normal. No respiratory distress.     Breath sounds: No stridor. Rales present. No wheezing or rhonchi.  Abdominal:     General: Abdomen is flat. Bowel sounds are normal. There is no distension.     Palpations: Abdomen is soft. There is no mass.     Tenderness: There is no abdominal tenderness. There is no guarding or rebound.     Hernia: No hernia is present.  Musculoskeletal:        General: Normal range of motion.     Cervical back: Normal range of motion and neck supple.     Right lower leg: Edema present.     Left lower leg: Edema present.  Skin:    General: Skin is warm and dry.     Findings: No rash.  Neurological:     General: No focal deficit present.     Mental Status: He is alert and oriented to person, place, and time. Mental status is at baseline.     Cranial Nerves: No cranial nerve deficit.     Sensory: No sensory deficit.     Motor: No weakness.     Coordination: Coordination normal.  Psychiatric:        Mood and Affect: Mood normal.        Behavior: Behavior normal.        Thought Content: Thought content normal.        Judgment: Judgment normal.     ED Results / Procedures / Treatments   Labs (all labs ordered are listed, but only abnormal results are displayed) Labs Reviewed  COMPREHENSIVE METABOLIC PANEL WITH GFR - Abnormal; Notable for the following components:      Result Value   Albumin 3.4 (*)    All other components within normal limits  BRAIN NATRIURETIC PEPTIDE - Abnormal; Notable for the following components:   B Natriuretic Peptide 176.2 (*)    All other components within normal limits  CBC WITH DIFFERENTIAL/PLATELET - Abnormal; Notable for the following components:   Hemoglobin 12.3 (*)    RDW 17.3 (*)    All other components within normal limits  RESP PANEL BY RT-PCR (RSV, FLU A&B,  COVID)  RVPGX2  CULTURE, BLOOD (ROUTINE X 2)  CULTURE, BLOOD (ROUTINE X 2)  URINALYSIS, ROUTINE W REFLEX MICROSCOPIC  PROTIME-INR  I-STAT CG4 LACTIC ACID, ED  I-STAT CG4 LACTIC ACID, ED  TROPONIN I (HIGH SENSITIVITY)  TROPONIN I (HIGH SENSITIVITY)    EKG EKG Interpretation Date/Time:  Tuesday Oct 12 2023 17:55:13 EDT Ventricular Rate:  80 PR Interval:  35 QRS Duration:  78 QT Interval:  375 QTC Calculation: 433 R Axis:   71  Text Interpretation: Sinus or ectopic atrial rhythm new Ventricular trigeminy Short PR interval Confirmed by Almond Army ([id number]) on 10/12/2023 6:00:05 PM  Radiology DG Chest Port 1 View Result Date: 10/12/2023 CLINICAL DATA:  Weakness. EXAM: PORTABLE CHEST 1 VIEW COMPARISON:  Chest radiograph dated 05/19/2022. FINDINGS: There is cardiomegaly with vascular congestion and edema. Small right pleural effusion. Pneumonia is not excluded. No pneumothorax. Median sternotomy wires and mechanical cardiac valve. No acute osseous pathology. IMPRESSION: Cardiomegaly with findings of CHF and small right pleural effusion. Electronically Signed   By: Angus Bark M.D.   On: 10/12/2023 18:50   CT Head Wo Contrast Result Date: 10/12/2023 CLINICAL DATA:  Delirium leg swelling EXAM: CT HEAD WITHOUT CONTRAST TECHNIQUE: Contiguous axial images were obtained from the base of the skull through the vertex without intravenous contrast. RADIATION DOSE REDUCTION: This exam was performed according to the departmental dose-optimization program which includes automated exposure control, adjustment of the mA and/or kV according to patient size and/or use of iterative reconstruction technique. COMPARISON:  CT brain 09/25/2023 FINDINGS: Brain: No acute territorial infarction, hemorrhage or intracranial mass. Moderate white matter hypodensity consistent with chronic small vessel ischemic change. Atrophy. Stable ventricle size Vascular: No hyperdense vessels.  Carotid vascular calcification.  Skull: Normal. Negative for fracture or focal lesion. Sinuses/Orbits: Mild mucosal thickening in the sinuses Other: None IMPRESSION: 1. No CT evidence for acute intracranial abnormality. 2. Atrophy and chronic small vessel ischemic changes of the white matter. Electronically Signed   By: Esmeralda Hedge M.D.   On: 10/12/2023 18:41    Procedures .Critical Care  Performed by: Roselynn Connors, PA-C Authorized by: Roselynn Connors, PA-C   Critical care provider statement:    Critical care time (minutes):  35   Critical care was necessary to treat or prevent imminent or life-threatening deterioration of the following conditions:  Respiratory failure   Critical care was time spent personally by me on the following activities:  Development of treatment plan with patient or surrogate, discussions with consultants, evaluation of patient's response to treatment, examination of patient, ordering and review of laboratory studies, ordering and review of radiographic studies, ordering and performing treatments and interventions, pulse oximetry, re-evaluation of patient's condition and review of old charts   I assumed direction of critical care for this patient from another provider in my specialty: no     Care discussed with: admitting provider       Medications Ordered in ED Medications  furosemide  (LASIX ) injection 20 mg (has no administration in time range)    ED Course/ Medical Decision Making/ A&P                                 Medical Decision Making Amount and/or Complexity of Data Reviewed Labs: ordered. Radiology: ordered.  Risk Prescription drug management. Decision regarding hospitalization.   This patient presents to the ED for concern of shortness of breath, increased edema, fatigue, this involves an extensive number of treatment options, and is a complaint that carries with it a high risk of complications and morbidity.  The differential diagnosis includes acute CHF,  ACS, pulmonary embolus, intracranial hemorrhage, sepsis, acute kidney injury, pneumonia, urinary tract infection   Co morbidities that complicate the patient evaluation  CHF  Additional history obtained:  Additional history obtained from daughter External records from outside source obtained and reviewed including medical records   Lab Tests:  I Ordered, and personally interpreted labs.  The pertinent results include: No leukocytosis, mild anemia, elevated BNP, normal electrolytes, normal kidney function liver function, negative viral swab, negative troponin, negative lactic acid   Imaging Studies ordered:  I ordered imaging studies including chest x-ray, CT scan of the head I independently visualized and interpreted imaging which showed pulmonary edema, no acute intracranial process I agree with the radiologist interpretation   Cardiac Monitoring: / EKG:  The patient was maintained on a cardiac monitor.  I personally viewed and interpreted the cardiac monitored which showed an underlying rhythm of: Atrial sinus rhythm, no ST/T wave changes, no ischemic changes, no STEMI   Consultations Obtained:  I requested consultation with the hospitalist,  and discussed lab and imaging findings as well as pertinent plan - they recommend: Admission   Problem List / ED Course / Critical interventions / Medication management  Patient does remain stable at this time but is requiring 2 L nasal cannula.  Patient does appear to be fluid overloaded at this point and has been provided with Lasix .  Urine is still pending at this time.  Remainder blood work has been overall unremarkable except for an elevated BNP.  Chest x-ray is consistent with pulmonary edema.  CT scan of the head demonstrated no signs of acute intracranial hemorrhage.  Vital signs have otherwise been stable at this point.  He is not requiring BiPAP at this time.  Discussed with patient and family we will plan for admission to the  hospital service.  Have discussed patient case with family medicine who has excepted the patient for admission. I ordered medication including Lasix  for acute CHF Reevaluation of the patient after these medicines showed that the patient improved I have reviewed the patients home medicines and have made adjustments as needed   Social Determinants of Health:  None   Test / Admission - Considered:  Admission        Final Clinical Impression(s) / ED Diagnoses Final diagnoses:  Acute congestive heart failure, unspecified heart failure type (HCC)  Respiratory failure with hypoxia, unspecified chronicity [facility])    Rx / DC Orders ED Discharge Orders     None         Emmalene Hare 10/12/23 2128    Almond Army, MD 10/14/23 2051

## 2023-10-12 NOTE — Assessment & Plan Note (Deleted)
***   Patient presented with increased work of breathing and dyspnea. Last Echo showed EF of 20-25% ***. CXR show*** Normal vital, afebrile and abscence of leukocytosis makes pneumonia less likely*** BNP was *** Tropinin*** Low concern for MI. On exam patient has bibasilar crackles with no JVD or LE edema*** COPD could be a contributing factor but pt show no signs of exacerbation  -Admit to FPTS, Attending Dr. Drue Gerald -Supplement O2 as needed, wean as tolerated -SPO2 saturation goals, 88-90 -Strict Is/Os -Daily Weight -Start IV Lasix *** -PT/OT Eval -Daily Lab: BNP, CBC -Cardiorespiratory Monitoring -1500 Fluid resuscitation

## 2023-10-12 NOTE — Progress Notes (Signed)
 PHARMACY - ANTICOAGULATION CONSULT NOTE  Pharmacy Consult for warfarin Indication: afib, bioprosthetic AVR  Allergies  Allergen Reactions   Pineapple Concentrate Nausea And Vomiting    Patient Measurements:    Vital Signs: Temp: 98.2 F (36.8 C) (05/13 1744) Temp Source: Oral (05/13 1744) BP: 169/108 (05/13 2015) Pulse Rate: 70 (05/13 2015)  Labs: Recent Labs    10/12/23 1755 10/12/23 2125  HGB 12.3*  --   HCT 40.7  --   PLT 156  --   LABPROT  --  31.1*  INR  --  3.0*  CREATININE 0.84  --   TROPONINIHS 10  --     CrCl cannot be calculated (Unknown ideal weight.).   Medical History: Past Medical History:  Diagnosis Date   Arthritis    Atrial fibrillation (HCC)    CAD (coronary artery disease) 2012   [place]-LAD 2 or 3 srents   Complication of anesthesia    "loopy after polpy removal dec 2017, lasted several days"   COPD (chronic obstructive pulmonary disease) (HCC)    Dementia (HCC)    GERD (gastroesophageal reflux disease)    HEART FAILURE, CONGESTIVE UNSPEC 02/23/2007   Qualifier: Diagnosis of  By: Cricket Doll MD, [facility]     Hyperlipidemia    Hypertension    OSA (obstructive sleep apnea) 11/13/2014   S/P AVR    #25 mm Edwards pericardial valve Bioprosthetic. Dr Sherene Dilling.   Situational depression    "son passed 11/2013"   Sleep apnea    occ uses c pap does not know settings    Stented coronary artery 2013   TOBACCO DEPENDENCE 07/29/2006   Qualifier: History of  By: Joni Net  MD, Stephanie     Type II diabetes mellitus (HCC)     Assessment: Matthew Edwards is a 83 y.o. year old male presented on 10/12/2023 with concern for HF exacerbation. On warfarin prior to admission for afib, bioprosthetic AVR. Prior to admission regimen, 5mg  daily (total weekly dose 35mg ). Last dose prior to admission 5/12 740pm. Pharmacy consulted for warfarin inpatient management.   Date INR Warfarin Dose  5/13 3, therapeutic ----  5/14 3, therapeutic  5mg  (planned)    Goal of Therapy:   INR 2-3 Monitor platelets by anticoagulation protocol: Yes   Plan:  Warfarin 5mg  x 1 today F/u daily INR, new drug-drug interactions and warfarin dosing  Thank you for allowing pharmacy to participate in this patient's care.  Fonda Hymen, PharmD Emergency Medicine Clinical Pharmacist 10/13/2023,3:50 AM

## 2023-10-12 NOTE — ED Triage Notes (Signed)
 BIB [facility] from home, patient was sent to ED by daughter for evaluation of pitting edema in lower extremities and increased drowsiness. Per EMS, patients daughter withheld a medication because it was making him urinate too much and also recently patient has been prescribed Seroquel .

## 2023-10-12 NOTE — Plan of Care (Deleted)
 FMTS Brief Progress Note  S: Went bedside with Dr. Bernardino Bridge with the patient.  Patient in good spirits, reports he is doing better, and the medicine is helping.   O: BP (!) 169/108   Pulse 70   Temp 98.2 F (36.8 C) (Oral)   Resp (!) 22   SpO2 100%   GEN:NAD, conversant Card: RRR, NRMG, S1/S2 Resp: CTABL, Normal WOB WJX:BJYNWG all extremities, cap refill < 2 sec  A/P: NSTEMI Patient here for NSTEMI, follow-up with cardiology.  Patient on heparin drip.  Patient reports feeling improved.  Plan to continue to monitor.  Plan for left heart cath tomorrow, n.p.o. at midnight. - Plans per day team - N.p.o. at midnight - Orders reviewed. Labs for AM ordered, which was adjusted as needed.   Wilhemena Harbour, MD 10/12/2023, 9:05 PM PGY-3, [place] [facility] Night Resident  Please page [phone] with questions.

## 2023-10-12 NOTE — H&P (Signed)
 Hospital Admission History and Physical Service Pager: [phone]  Patient name: Matthew Edwards Medical record number: [id number] Date of Birth: [birth year] Age: 83 y.o. Gender: male  Primary Care Provider: Ulysees Gander, MD Consultants: none Code Status: Full Code  Preferred Emergency Contact:  Contact Information     Name Relation Home Work Mobile   [place] Daughter [phone]     [place], Salser   [phone]      Other Contacts     Name Relation Home Work [place] Son   [phone]        Chief Complaint: Dyspnea  Assessment and Plan: Matthew Edwards is a 83 y.o. male presenting with dyspnea . Differential for presentation of this includes CHF exacerbation, PNA, MI.   - CHF exacerbation most likely given elevated BNP, CXR with edema, and non adherence to home lasix  regimen. - Less suspicion for PNA given no leukocytosis, afebrile. - Low concern for MI or other ACS picture given negative Troponins.  Assessment & Plan CHF exacerbation (HCC) Has not been taking lasix  at home as prescribed per daughter. Does appear mildly volume up. BNP slightly elevated. Denies any difficulty breathing but is on supplemental O2 here (no O2 requirement at home). Last Echo 2021 with normal LVEF 60-65%. S/p IV lasix  20 mg in the ED. Suspect this is all due to deviation from lasix  regimen, but will continue workup as below. - Admit to FMTS, Attending Dr. Pray - Supplement O2 as needed, wean as tolerated - CCM x 24hrs - Strict I&Os - Daily Weight - Echo - PT/OT Eval - AM CBC, BMP - f/u blood cultures collected in ED - continue home metoprolol  100 mg daily - day team to redose lasix  as appropriate Chronic health problem Dementia: continue home donepezil  10 mg at bedtime; holding seroquel  given concern for confusion. Atrial fibrillation: on warfarin at home. Collect INR and pharmacy consulted for warfarin dosing.   FEN/GI: Carb modified/Heart healthy  diet VTE Prophylaxis: Warfarin per pharmacy  Disposition: med tele  History of Present Illness:  Matthew Edwards is a 83 y.o. male presenting with dyspnea. He tells us  he is not sure why he was brought to the ED but his daughter was concerned about him. On further questioning patient does report daughter sent him in via EMS because she was concerned about him taking his medicines.   He notes that he does have some CP which started a couple weeks ago, not worsening. Denies any trouble with breathing and does not wear O2 at home.   Appreciates some slight new belly pain. Notes no swelling in legs or belly, no changes in vision, no headaches, but does note that he's had a cough for a while.   Daughter helps with meds at home.   In the ED, labs mostly unremarkable.  BNP somewhat elevated to 176.  Troponins flat.  CXR did show cardiomegaly and evidence of CHF.  CT head was obtained this patient was somewhat slow to answer questions and baseline mentation unknown; this was unremarkable.  EKG with atrial fibrillation, PVCs.  Review Of Systems: Per HPI.  Pertinent Past Medical History: Atrial fibrillation CAD COPD GERD HLD HTN OSA (unclear if on CPAP) T2DM Remainder reviewed in history tab.   Pertinent Past Surgical History: Aortic valve replacement (2012) CABG (2012) Stent placement (2006, 2008) Remainder reviewed in history tab.   Pertinent Social History: Tobacco use: Former, 6 months ago stopped, smoked 1 PPD for many years  Alcohol use: quit a couple months ago Other Substance use: None Lives alone, but daughter comes by to help out  Pertinent Family History: Mother-heart disease Remainder reviewed in history tab.   Important Outpatient Medications: Med rec confirmed by pharmacy tech with daughter; patient does not know his medications. Tylenol  500 mg twice daily Vitamin C  daily Symbicort  twice daily Donepezil  10 mg daily at bedtime Lasix  20 mg - has not been taking due  to incontinence overnight Gabapentin  100 mg daily at bedtime Metoprolol  succinate 100 mg daily Quetiapine  50 mg daily at bedtime Tizanidine 2 mg as needed for muscle spasms, at bedtime Warfarin 5 mg daily Remainder reviewed in medication history.   Objective: BP (!) 136/99   Pulse (!) 121   Temp 98.2 F (36.8 C) (Oral)   Resp 10   Ht 5\' 9"  (1.753 m)   Wt 79.9 kg   SpO2 100%   BMI 26.01 kg/m  Exam: General: Pleasant elderly male, no acute distress. HEENT: normocephalic, PERRLA, EOM grossly intact. Cardio: Irregularly irregular rhythm. No murmurs on exam. Pulm: Rhonchi in all lung fields. No increased work of breathing on [place]. Abdominal: bowel sounds present, soft, non-tender, non-distended. No hepatosplenomegaly. Extremities: Minimal bilateral LE pitting edema. Neuro: Alert and oriented to self/location; not oriented to year. Speech normal in content, no facial asymmetry. Psych:  Alert, communicative, and cooperative.   Labs:  CBC BMET  Recent Labs  Lab 10/12/23 1755  WBC 5.4  HGB 12.3*  HCT 40.7  PLT 156   Recent Labs  Lab 10/12/23 1755  NA 145  K 3.9  CL 108  CO2 30  BUN 10  CREATININE 0.84  GLUCOSE 84  CALCIUM  9.4     Respiratory panel negative Troponin 10 > 9 Lactic acid 1.0 BNP 176.2   5/13 EKG: Does appear to be in atrial fibrillation with some PVCs, no QTc prolongation, no ST elevation.   5/13 CT head: IMPRESSION: 1. No CT evidence for acute intracranial abnormality. 2. Atrophy and chronic small vessel ischemic changes of the white matter.  5/13 CXR: IMPRESSION: Cardiomegaly with findings of CHF and small right pleural effusion.   Omar Bibber, DO 10/12/2023, 10:37 PM PGY-1, [facility]  FPTS Intern pager: [phone], text pages welcome Secure chat group [facility] Teaching Service

## 2023-10-12 NOTE — Assessment & Plan Note (Signed)
 Dementia: continue home donepezil  10 mg at bedtime; holding seroquel  given concern for confusion. Atrial fibrillation: on warfarin at home. Collect INR and pharmacy consulted for warfarin dosing.

## 2023-10-13 ENCOUNTER — Other Ambulatory Visit: Payer: Self-pay

## 2023-10-13 ENCOUNTER — Inpatient Hospital Stay (HOSPITAL_COMMUNITY)

## 2023-10-13 DIAGNOSIS — I509 Heart failure, unspecified: Secondary | ICD-10-CM

## 2023-10-13 LAB — BASIC METABOLIC PANEL WITH GFR
Anion gap: 7 (ref 5–15)
BUN: 9 mg/dL (ref 8–23)
CO2: 31 mmol/L (ref 22–32)
Calcium: 9.2 mg/dL (ref 8.9–10.3)
Chloride: 105 mmol/L (ref 98–111)
Creatinine, Ser: 0.71 mg/dL (ref 0.61–1.24)
GFR, Estimated: >60 mL/min (ref >60)
Glucose, Bld: 105 mg/dL — ABNORMAL HIGH (ref 70–99)
Potassium: 3.4 mmol/L — ABNORMAL LOW (ref 3.5–5.1)
Sodium: 143 mmol/L (ref 135–145)

## 2023-10-13 LAB — CBC
HCT: 40.7 % (ref 39.0–52.0)
Hemoglobin: 12 g/dL — ABNORMAL LOW (ref 13.0–17.0)
MCH: 26.3 pg (ref 26.0–34.0)
MCHC: 29.5 g/dL — ABNORMAL LOW (ref 30.0–36.0)
MCV: 89.1 fL (ref 80.0–100.0)
Platelets: 149 10*3/uL — ABNORMAL LOW (ref 150–400)
RBC: 4.57 MIL/uL (ref 4.22–5.81)
RDW: 17 % — ABNORMAL HIGH (ref 11.5–15.5)
WBC: 6.5 10*3/uL (ref 4.0–10.5)
nRBC: 0 % (ref 0.0–0.2)

## 2023-10-13 LAB — PROTIME-INR
INR: 3 — ABNORMAL HIGH (ref 0.8–1.2)
Prothrombin Time: 31 s — ABNORMAL HIGH (ref 11.4–15.2)

## 2023-10-13 LAB — ECHOCARDIOGRAM COMPLETE
AR max vel: 3.37 cm2
AV Area VTI: 3.6 cm2
AV Area mean vel: 3.27 cm2
AV Mean grad: 3.5 mmHg
AV Peak grad: 6.4 mmHg
Ao pk vel: 1.26 m/s
Area-P 1/2: 2.71 cm2
Height: 69 in
S' Lateral: 3.1 cm
Weight: 2818.36 [oz_av]

## 2023-10-13 LAB — TECHNOLOGIST SMEAR REVIEW: Plt Morphology: NORMAL

## 2023-10-13 MED ORDER — POTASSIUM CHLORIDE CRYS ER 20 MEQ PO TBCR
20.0000 meq | EXTENDED_RELEASE_TABLET | Freq: Once | ORAL | Status: AC
  Administered 2023-10-13: 20 meq via ORAL
  Filled 2023-10-13: qty 1

## 2023-10-13 MED ORDER — WARFARIN SODIUM 5 MG PO TABS
5.0000 mg | ORAL_TABLET | Freq: Once | ORAL | Status: DC
  Filled 2023-10-13 (×2): qty 1

## 2023-10-13 MED ORDER — POTASSIUM CHLORIDE CRYS ER 20 MEQ PO TBCR
40.0000 meq | EXTENDED_RELEASE_TABLET | Freq: Once | ORAL | Status: AC
  Administered 2023-10-13: 40 meq via ORAL
  Filled 2023-10-13: qty 2

## 2023-10-13 NOTE — Evaluation (Signed)
 Occupational Therapy Evaluation Patient Details Name: Matthew Edwards MRN: [id number] DOB: 08/09/1940 Today's Date: 10/13/2023   History of Present Illness   Pt is an 83 y/o M presenting to ED On 5/13 with BLE edema, incr drowsiness, had not been taking Lasix  at home. Admitted for CHF exacerbation. Recent fall, CT head negative. PMH includes CAD s/p CABG and stenting, A fib, chronic bowel incontinence, AVR, HTN, dementia     Clinical Impressions Pt questionable historian, reports living alone but his daughter helps with meds/IADLs, uses RW for mobility. Pt currently needing up to mod A for ADLs, min A for bed mobility and min A for transfers with RW. Pt SpO2 down to 82% on RA, incr to 90s with 2L O2 donned and cues for PLB. Pt presenting with impairments listed below, will follow acutely. Patient will benefit from continued inpatient follow up therapy, <3 hours/day to maximize safety/ind with ADL/functional mobility.      If plan is discharge home, recommend the following:   A little help with walking and/or transfers;A lot of help with bathing/dressing/bathroom;Assistance with cooking/housework;Direct supervision/assist for financial management;Direct supervision/assist for medications management;Assist for transportation;Supervision due to cognitive status;Help with stairs or ramp for entrance     Functional Status Assessment   Patient has had a recent decline in their functional status and demonstrates the ability to make significant improvements in function in a reasonable and predictable amount of time.     Equipment Recommendations   BSC/3in1;Tub/shower seat     Recommendations for Other Services   PT consult     Precautions/Restrictions   Precautions Precautions: Fall Precaution/Restrictions Comments: watch O2 Restrictions Weight Bearing Restrictions Per Provider Order: No     Mobility Bed Mobility Overal bed mobility: Needs Assistance Bed Mobility: Supine  to Sit, Sit to Supine     Supine to sit: Min assist Sit to supine: Contact guard assist   General bed mobility comments: min A for trunk elevation    Transfers Overall transfer level: Needs assistance Equipment used: Rolling walker (2 wheels) Transfers: Sit to/from Stand Sit to Stand: Min assist                  Balance Overall balance assessment: Needs assistance Sitting-balance support: Feet supported Sitting balance-Leahy Scale: Fair     Standing balance support: During functional activity, Reliant on assistive device for balance Standing balance-Leahy Scale: Poor Standing balance comment: RW for ambulation                           ADL either performed or assessed with clinical judgement   ADL Overall ADL's : Needs assistance/impaired Eating/Feeding: Set up;Bed level   Grooming: Contact guard assist;Minimal assistance;Standing   Upper Body Bathing: Moderate assistance;Sitting;Standing   Lower Body Bathing: Moderate assistance;Sitting/lateral leans;Sit to/from stand   Upper Body Dressing : Moderate assistance;Sitting;Standing   Lower Body Dressing: Moderate assistance;Sit to/from stand;Sitting/lateral leans   Toilet Transfer: Minimal assistance;Ambulation;Rolling walker (2 wheels)   Toileting- Clothing Manipulation and Hygiene: Contact guard assist       Functional mobility during ADLs: Minimal assistance;Rolling walker (2 wheels)       Vision   Vision Assessment?: No apparent visual deficits     Perception Perception: Not tested       Praxis Praxis: Not tested       Pertinent Vitals/Pain Pain Assessment Pain Assessment: No/denies pain     Extremity/Trunk Assessment Upper Extremity Assessment Upper Extremity Assessment: Generalized weakness  Lower Extremity Assessment Lower Extremity Assessment: Defer to PT evaluation   Cervical / Trunk Assessment Cervical / Trunk Assessment: Normal   Communication  Communication Communication: No apparent difficulties;Impaired Factors Affecting Communication: Hearing impaired   Cognition Arousal: Alert Behavior During Therapy: Restless Cognition: History of cognitive impairments, No family/caregiver present to determine baseline, Cognition impaired   Orientation impairments: Situation, Place (aware that the clock in his room is wrong, asks if he is in the cafeteria) Awareness: Online awareness impaired Memory impairment (select all impairments): Short-term memory, Working Civil Service fast streamer, Conservation officer, historic buildings, Non-declarative long-term memory Attention impairment (select first level of impairment): Sustained attention Executive functioning impairment (select all impairments): Problem solving, Reasoning, Sequencing OT - Cognition Comments: incr time /cues to [employer] shirt, cues for safety and to keep RW close with mobility                 Following commands: Impaired Following commands impaired: Follows one step commands inconsistently     Cueing  General Comments   Cueing Techniques: Verbal cues;Gestural cues  SpO2 down to 82% on RA, incr to 90s with 2L O2 donned   Exercises     Shoulder Instructions      Home Living Family/patient expects to be discharged to:: Private residence Living Arrangements: Alone Available Help at Discharge: Family;Available PRN/intermittently Type of Home: House Home Access: Stairs to enter [employer] of Steps: flight   Home Layout: One level     Bathroom Shower/Tub: Walk-in shower         Home Equipment: Agricultural consultant (2 wheels)          Prior Functioning/Environment Prior Level of Function : Needs assist             Mobility Comments: used RW ADLs Comments: ind, but daughter assisted with meds/IADL?    OT Problem List: Decreased strength;Decreased range of motion;Decreased activity tolerance;Impaired balance (sitting and/or standing);Decreased cognition;Decreased  coordination;Decreased safety awareness;Cardiopulmonary status limiting activity   OT Treatment/Interventions: Self-care/ADL training;Therapeutic exercise;Energy conservation;DME and/or AE instruction;Therapeutic activities;Patient/family education;Balance training;Cognitive remediation/compensation      OT Goals(Current goals can be found in the care plan section)   Acute Rehab OT Goals Patient Stated Goal: to go to the bathroom OT Goal Formulation: With patient Time For Goal Achievement: 10/27/23 Potential to Achieve Goals: Good ADL Goals Pt Will Perform Upper Body Dressing: with modified independence;sitting Pt Will Perform Lower Body Dressing: with modified independence;sitting/lateral leans;sit to/from stand Pt Will Transfer to Toilet: with modified independence;ambulating;regular height toilet Pt Will Perform Tub/Shower Transfer: with modified independence;ambulating   OT Frequency:  Min 2X/week    Co-evaluation              AM-PAC OT "6 Clicks" Daily Activity     Outcome Measure Help from another person eating meals?: A Little Help from another person taking care of personal grooming?: A Little Help from another person toileting, which includes using toliet, bedpan, or urinal?: A Little Help from another person bathing (including washing, rinsing, drying)?: A Lot Help from another person to put on and taking off regular upper body clothing?: A Little Help from another person to put on and taking off regular lower body clothing?: A Lot 6 Click Score: 16   End of Session Equipment Utilized During Treatment: Gait belt;Rolling walker (2 wheels) Nurse Communication: Mobility status  Activity Tolerance: Patient tolerated treatment well Patient left: in bed;with call bell/phone within reach (ED stretcher)  OT Visit Diagnosis: Unsteadiness on feet (R26.81);Other abnormalities of gait and mobility (  R26.89);Muscle weakness (generalized) (M62.81);History of falling  (Z91.81);Other symptoms and signs involving cognitive function                Time: 0802-0825 OT Time Calculation (min): 23 min Charges:  OT General Charges $OT Visit: 1 Visit OT Evaluation $OT Eval Moderate Complexity: 1 Mod  Burgandy Hackworth K, OTD, OTR/L SecureChat Preferred Acute Rehab [phone]   Haseeb Fiallos K Koonce 10/13/2023, 9:04 AM

## 2023-10-13 NOTE — Progress Notes (Signed)
 Daily Progress Note Intern Pager: [phone]  Patient name: Matthew Edwards Medical record number: [id number] Date of birth: 10/19/1940 Age: 83 y.o. Gender: male  Primary Care Provider: Ulysees Gander, MD Consultants: None Code Status: FULL  Pt Overview and Major Events to Date:  5/13 - Admitted, diuresis with Lasix  20 mg IV   Assessment and Plan: HENSON SWINEFORD is a 83 y.o. male with a pertinent PMH of CAD, COPD, HTN, OSA, and HFpEF who presented with dyspnea and was admitted for suspected CHF exacerbation, currently undergoing diuresis and likely close to euvolemic.  Desatted with PT this AM and PT/OT are recommending SNF for discharge, so patient will remain hospitalized for placement.  Unclear etiology of hypoxemia, but given admission CXR and elevated BNP will pursue further diuresis and follow up I&Os when available.  Planning to call patient's daughter for collateral shortly. Assessment & Plan CHF exacerbation [facility]) Previous TTE August 2021 with EF 60-65%.  Home regimen consists of Lasix  20 mg, but has not been taking regularly per daughter due to nocturnal enuresis.  BNP mildly elevated on admission with congestion on CXR, though does appear more euvolemic on exam today.  I&Os unavailable in ED, will verify when patient reaches floor. - Supplement O2 to goal O2 88-92%, wean as tolerated - Cardiac monitoring made continuous - Strict I&Os and daily weights, not yet completed in ED - BCx NGTD, no concern for bacteremia - Continue home metoprolol  100 mg daily, did have some bradycardia overnight - Redose Lasix  20 mg IV, follow up I&Os when available - AM BMP, Mag; goal K>4, Mag>2 Chronic health problem Dementia: Continue home donepezil  10 mg at bedtime; reorder home seroquel  50 mg QHS given concern for confusion Atrial fibrillation: Metoprolol  as above, on warfarin at home given aortic valve replacement; Warfarin per pharmacy dosing COPD: Continue Breo Ellipta  daily, O2  goal adjusted to 88-92%  FEN/GI: Carb Modified/Heart Healthy diet PPx: Warfarin per pharmacy Dispo: Pending PT recommendations  and clinical workup. Barriers include cardiac planning.  Subjective:  NAEON.  This AM, patient does not know where he is or what is going on.  Difficult to speak with him due to hearing impairment.  Keeps requesting that I call his daughter Robinette Chou to tell her where he is.  Of note, daughter was reportedly the one who called EMS to bring patient to the ED.  Objective: Temp:  [98.1 F (36.7 C)-98.4 F (36.9 C)] 98.1 F (36.7 C) (05/14 0400) Pulse Rate:  [42-121] 53 (05/14 0400) Resp:  [10-28] 28 (05/14 0400) BP: (136-182)/(78-108) 142/81 (05/14 0400) SpO2:  [90 %-100 %] 100 % (05/14 0400) Weight:  [79.9 kg] 79.9 kg (05/13 2226)  Physical Exam: General: Age-appropriate, resting comfortably in bed, NAD, alert and at baseline. HEENT: No JVD.  MMM.  Very hard of hearing. Cardiovascular: Irregular rate and rhythm. Normal S1/S2. No murmurs, rubs, or gallops appreciated. Pulmonary: Clear bilaterally to auscultation; no wheezes, crackles, or rhonchi. Normal WOB on 2L O2. Abdominal: Normoactive bowel sounds, nondistended. No tenderness to deep or light palpation. No rebound or guarding. No HSM. Skin: Warm and dry. Extremities: No peripheral edema bilaterally. Capillary refill <2 seconds. Neuro: Alert to self, but not place, time, or situation.  Laboratory: Most recent CBC Lab Results  Component Value Date   WBC 6.5 10/13/2023   HGB 12.0 (L) 10/13/2023   HCT 40.7 10/13/2023   MCV 89.1 10/13/2023   PLT 149 (L) 10/13/2023   Most recent BMP  Latest Ref Rng & Units 10/13/2023    5:30 AM  BMP  Glucose 70 - 99 mg/dL 098   BUN 8 - 23 mg/dL 9   Creatinine 1.19 - 1.47 mg/dL 8.29   Sodium 562 - 130 mmol/L 143   Potassium 3.5 - 5.1 mmol/L 3.4   Chloride 98 - 111 mmol/L 105   CO2 22 - 32 mmol/L 31   Calcium  8.9 - 10.3 mg/dL 9.2     Other pertinent labs: -INR  3.0, Warfarin per pharmacy dosing  New Imaging/Diagnostic Tests: -Echo ordered  Marquette Blodgett Lansing Planas, MD 10/13/2023, 7:08 AM  PGY-1, [facility] FPTS Intern pager: [phone], text pages welcome Secure chat group [facility] Teaching Service

## 2023-10-13 NOTE — Assessment & Plan Note (Addendum)
 Dementia: Continue home donepezil  10 mg at bedtime; reorder home seroquel  50 mg QHS given concern for confusion Atrial fibrillation: Metoprolol  as above, on warfarin at home given aortic valve replacement; Warfarin per pharmacy dosing COPD: Continue Breo Ellipta  daily, O2 goal adjusted to 88-92%

## 2023-10-13 NOTE — NC FL2 (Signed)
 [place]  MEDICAID FL2 LEVEL OF CARE FORM     IDENTIFICATION  Patient Name: Matthew Edwards Birthdate: 02/06/1941 Sex: male Admission Date (Current Location): 10/12/2023  [facility] and [place] Number:  Producer, television/film/video and Address:  [facility], [address], [place], [place] [address]      Provider Number: [id number]  Attending Physician Name and Address:  Candee Cha, MD  Relative Name and Phone Number:       Current Level of Care: Hospital Recommended Level of Care: Skilled Nursing Facility Prior Approval Number:    Date Approved/Denied:   PASRR Number: [id number]  Discharge Plan: SNF    Current Diagnoses: Patient Active Problem List   Diagnosis Date Noted   Chronic health problem 10/12/2023   CHF exacerbation (HCC) 10/12/2023   Dysuria 08/13/2020   Lower extremity weakness 03/18/2020   Incontinence, feces 03/12/2020   Cerumen impaction 11/23/2019   Weight loss 11/21/2019   Adenomatous rectal polyp with high grade dysplasia s/p TEM resection 05/26/2016 05/26/2016   Spinal stenosis of lumbar region 11/28/2015   OSA (obstructive sleep apnea) 11/13/2014   Pulmonary HTN (HCC) 08/24/2014   Acute on chronic congestive heart failure with left ventricular diastolic dysfunction (HCC) 08/02/2014   Cognitive impairment 07/02/2014   S/P AVR 12/29/2012   Atrial fibrillation (HCC)    Type 2 diabetes mellitus with diabetic neuropathy, unspecified (HCC) 11/25/2011   Vitamin D  deficiency 02/21/2010   Hypercalcemia 05/29/2009   Essential hypertension 02/23/2007   Coronary atherosclerosis 12/15/2006   GERD 12/15/2006   COPD (chronic obstructive pulmonary disease) (HCC) 12/10/2006   HLD (hyperlipidemia) 07/29/2006   Alcohol abuse 07/29/2006   Aortic valve disorder 07/29/2006    Orientation RESPIRATION BLADDER Height & Weight     Self, Time  O2, Normal (2L currently) Continent Weight: 176 lb 2.4 oz (79.9 kg) Height:  5\' 9"  (175.3 cm)   BEHAVIORAL SYMPTOMS/MOOD NEUROLOGICAL BOWEL NUTRITION STATUS      Continent Diet (See discharge summary)  AMBULATORY STATUS COMMUNICATION OF NEEDS Skin   Supervision   Normal                       Personal Care Assistance Level of Assistance  Bathing, Feeding, Dressing Bathing Assistance: Limited assistance Feeding assistance: Limited assistance Dressing Assistance: Limited assistance     Functional Limitations Info  Sight, Hearing, Speech Sight Info: Impaired (Glasses) Hearing Info: Adequate Speech Info: Adequate    SPECIAL CARE FACTORS FREQUENCY  OT (By licensed OT), PT (By licensed PT)     PT Frequency: 5x weekly OT Frequency: 5x weekly            Contractures Contractures Info: Not present    Additional Factors Info  Code Status, Allergies Code Status Info: Full Code Allergies Info: Pineapple concentrate           Current Medications (10/13/2023):  This is the current hospital active medication list Current Facility-Administered Medications  Medication Dose Route Frequency Provider Last Rate Last Admin   acetaminophen  (TYLENOL ) tablet 650 mg  650 mg Oral Q6H PRN Omar Bibber, DO       Or   acetaminophen  (TYLENOL ) suppository 650 mg  650 mg Rectal Q6H PRN Omar Bibber, DO       donepezil  (ARICEPT ) tablet 10 mg  10 mg Oral QHS Spence, Sarah, DO   10 mg at 10/12/23 2313   fluticasone  furoate-vilanterol (BREO ELLIPTA ) 100-25 MCG/ACT 1 puff  1 puff Inhalation Daily  Omar Bibber, DO       metoprolol  succinate (TOPROL -XL) 24 hr tablet 100 mg  100 mg Oral Daily Spence, Sarah, DO       warfarin (COUMADIN ) tablet 5 mg  5 mg Oral ONCE-1600 Pray, Cain Castillo, MD       Warfarin - Pharmacist Dosing Inpatient   Does not apply [id number] Charmel Cooter, MD       Current Outpatient Medications  Medication Sig Dispense Refill   acetaminophen  (TYLENOL ) 500 MG tablet Take 500 mg by mouth in the morning and at bedtime.     Ascorbic Acid  (VITAMIN C  PO) Take 1 tablet by  mouth daily.     budesonide -formoterol  (SYMBICORT ) 80-4.5 MCG/ACT inhaler Inhale 2 puffs into the lungs 2 (two) times daily. 3 g 0   donepezil  (ARICEPT ) 10 MG tablet Take 10 mg by mouth daily.     furosemide  (LASIX ) 20 MG tablet TAKE 1 TABLET BY MOUTH DAILY (Patient taking differently: Take 20 mg by mouth daily.) 90 tablet 3   gabapentin  (NEURONTIN ) 100 MG capsule TAKE 1 CAPSULE BY MOUTH AT  BEDTIME (Patient taking differently: Take 100 mg by mouth at bedtime.) 100 capsule 2   metoprolol  succinate (TOPROL -XL) 100 MG 24 hr tablet TAKE 1 TABLET BY MOUTH DAILY (Patient taking differently: Take 100 mg by mouth daily.) 90 tablet 3   Multiple Vitamin (MULTIVITAMIN WITH MINERALS) TABS tablet Take 1 tablet by mouth daily.     QUEtiapine  (SEROQUEL ) 50 MG tablet Take 50 mg by mouth daily.     tiZANidine (ZANAFLEX) 2 MG tablet Take 2 mg by mouth at bedtime as needed for muscle spasms.     warfarin (COUMADIN ) 5 MG tablet Take 5 mg by mouth daily.     nitroGLYCERIN  (NITROSTAT ) 0.4 MG SL tablet Place 0.4 mg under the tongue every 5 (five) minutes as needed for chest pain. (Patient not taking: Reported on 10/12/2023)       Discharge Medications: Please see discharge summary for a list of discharge medications.  Relevant Imaging Results:  Relevant Lab Results:   Additional Information SSN [id number]  Dexter Forest, LCSW

## 2023-10-13 NOTE — ED Notes (Signed)
 PT otf with transport, in new onset distress.

## 2023-10-13 NOTE — Progress Notes (Signed)
 Transition of Care [facility]) - Inpatient Brief Assessment   Patient Details  Name: Matthew Edwards MRN: [id number] Date of Birth: 01-Sep-1940  Transition of Care [facility]) CM/SW Contact:    Joanette Moynahan, RN Phone Number: 10/13/2023, 8:54 AM   Clinical Narrative: RNCM stopped by pt to help him reach his breakfast.  RNCM put breakfast in reach and reapplied nasal cannula, as pt SATs were 83-88% on room air.  Bedside RN entered room prior to [facility] leaving and assumed care.  RNCM following for discharge needs. Muneeb Veras J. Rachel Budds, RN, BSN, [place] [phone]     Transition of Care Asessment: Insurance and Status: Insurance coverage has been reviewed Patient has primary care physician: Yes Home environment has been reviewed: (P) home alone; has caretaker and daughter who picks up Rx/takes him to appointments Prior level of function:: (P) moderate assistance Prior/Current Home Services: (P) Current home services (private CNA) Social Drivers of Health Review: (P) SDOH reviewed no interventions necessary Readmission risk has been reviewed: (P) Yes Transition of care needs: (P) transition of care needs identified, TOC will continue to follow

## 2023-10-13 NOTE — Hospital Course (Addendum)
 Matthew Edwards is a 83 y.o. year old with a history of CAD, COPD, HTN, OSA, Afib, HFpEF and aortic valve replacement who presented with dyspnea and was admitted to the [facility] Medicine Teaching service for acute hypoxemic respiratory failure suspected d/t CHF exacerbation.  Acute hypoxemic respiratory failure  CHF exacerbation In the ED, labs mostly unremarkable.  BNP mildly elevated to 176.  Troponins flat.  CXR did show cardiomegaly and evidence of CHF.  CT head was obtained this patient was somewhat slow to answer questions and baseline mentation unknown; this was unremarkable.  EKG with atrial fibrillation, rare PVCs.  Lasix  20 mg IV was continued x2 days, but I&Os were not documented while in the ED.  On day 3, patient resumed home Lasix  20 mg PO and was successfully weaned to room air.  Repeat echo showed unchanged EF.  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  Cardiology was consulted, who recommended repeat echo in 1 year for surveillance.  Other chronic conditions were medically managed with home medications and formulary alternatives as necessary (dementia, A fib, COPD).  PCP Follow-up Recommendations: Dr. Albert Huff, Cardiology, recommends repeat echocardiogram in 1 year for further surveillance of fibroblastoma. Metoprolol  dose decreased from 100mg  to 50mg  due to HR; consider dose adjustment as necessary.

## 2023-10-13 NOTE — Assessment & Plan Note (Addendum)
 Previous TTE August 2021 with EF 60-65%.  Home regimen consists of Lasix  20 mg, but has not been taking regularly per daughter due to nocturnal enuresis.  BNP mildly elevated on admission with congestion on CXR, though does appear more euvolemic on exam today.  I&Os unavailable in ED, will verify when patient reaches floor. - Supplement O2 to goal O2 88-92%, wean as tolerated - Cardiac monitoring made continuous - Strict I&Os and daily weights, not yet completed in ED - BCx NGTD, no concern for bacteremia - Continue home metoprolol  100 mg daily, did have some bradycardia overnight - Redose Lasix  20 mg IV, follow up I&Os when available - AM BMP, Mag; goal K>4, Mag>2

## 2023-10-13 NOTE — ED Notes (Signed)
Transport called for pt.

## 2023-10-13 NOTE — ED Notes (Signed)
 Pt otf to procedure.

## 2023-10-13 NOTE — Evaluation (Signed)
 Physical Therapy Evaluation Patient Details Name: Matthew Edwards MRN: [id number] DOB: [birth year] Today's Date: 10/13/2023  History of Present Illness  Pt is an 83 y/o M presenting to ED On 5/13 with BLE edema, incr drowsiness, had not been taking Lasix  at home. Admitted for CHF exacerbation w/ small R pleural effusion. Recent fall, CT head negative. PMH includes CAD s/p CABG and stenting, A fib, chronic bowel incontinence, AVR, HTN, dementia   Clinical Impression  Pt in bed upon arrival and agreeable to PT eval. PTA, pt was ModI with RW with at least one recent fall. Pt was confused during the session and needed multiple cues for safety while mobilizing. He required MinA for all mobility this session due to unsteadiness and to assist with RW management. He was able to ambulate 20 ft with MinA and RW. Pt would benefit from <3hrs post acute rehab to prevent future falls and work towards independence with mobility. Pt currently with functional limitations due to the deficits listed below (see PT Problem List). Pt would benefit from acute skilled PT to address functional impairments. Acute PT to follow.    SpO2 down to 82% on RA, incr to 90s with 2L O2     If plan is discharge home, recommend the following: A little help with walking and/or transfers;A little help with bathing/dressing/bathroom;Assist for transportation;Help with stairs or ramp for entrance;Direct supervision/assist for financial management;Direct supervision/assist for medications management;Assistance with cooking/housework   Can travel by private vehicle   Yes    Equipment Recommendations None recommended by PT     Functional Status Assessment Patient has had a recent decline in their functional status and demonstrates the ability to make significant improvements in function in a reasonable and predictable amount of time.     Precautions / Restrictions Precautions Precautions: Fall Precaution/Restrictions Comments: watch  O2 Restrictions Weight Bearing Restrictions Per Provider Order: No      Mobility  Bed Mobility Overal bed mobility: Needs Assistance Bed Mobility: Supine to Sit, Sit to Supine    Supine to sit: Min assist Sit to supine: Contact guard assist   General bed mobility comments: min A for trunk elevation    Transfers Overall transfer level: Needs assistance Equipment used: Rolling walker (2 wheels) Transfers: Sit to/from Stand Sit to Stand: Min assist    General transfer comment: MinA for steadying assist, cues for hand placement    Ambulation/Gait Ambulation/Gait assistance: Min assist Gait Distance (Feet): 20 Feet Assistive device: Rolling walker (2 wheels) Gait Pattern/deviations: Step-through pattern, Shuffle, Drifts right/left, Trunk flexed, Decreased stride length Gait velocity: decr     General Gait Details: slightly unsteady gait with MinA required for balance and RW management. Tends to veer R/L with cues for RW proximity    Balance Overall balance assessment: Needs assistance Sitting-balance support: Feet supported Sitting balance-Leahy Scale: Fair     Standing balance support: During functional activity, Reliant on assistive device for balance Standing balance-Leahy Scale: Poor Standing balance comment: RW for ambulation          Pertinent Vitals/Pain Pain Assessment Pain Assessment: No/denies pain    Home Living Family/patient expects to be discharged to:: Private residence Living Arrangements: Alone Available Help at Discharge: Family;Available PRN/intermittently Type of Home: House Home Access: Stairs to enter   Entrance Stairs-Number of Steps: flight   Home Layout: One level Home Equipment: Agricultural consultant (2 wheels)      Prior Function Prior Level of Function : Needs assist      Mobility  Comments: used RW ADLs Comments: ind, but daughter assisted with meds/IADL?     Extremity/Trunk Assessment   Upper Extremity Assessment Upper  Extremity Assessment: Defer to OT evaluation    Lower Extremity Assessment Lower Extremity Assessment: Generalized weakness    Cervical / Trunk Assessment Cervical / Trunk Assessment: Normal  Communication   Communication Communication: No apparent difficulties;Impaired Factors Affecting Communication: Hearing impaired    Cognition Arousal: Alert Behavior During Therapy: Restless   PT - Cognitive impairments: No family/caregiver present to determine baseline, Problem solving, Safety/Judgement, History of cognitive impairments    Following commands: Impaired Following commands impaired: Follows one step commands inconsistently     Cueing Cueing Techniques: Verbal cues, Gestural cues     General Comments General comments (skin integrity, edema, etc.): SpO2 down to 82% on RA, incr to 90s with 2L O2 donned     PT Assessment Patient needs continued PT services  PT Problem List Decreased strength;Decreased activity tolerance;Decreased balance;Decreased mobility;Decreased safety awareness;Cardiopulmonary status limiting activity       PT Treatment Interventions DME instruction;Gait training;Stair training;Functional mobility training;Therapeutic activities;Therapeutic exercise;Balance training;Neuromuscular re-education;Patient/family education    PT Goals (Current goals can be found in the Care Plan section)  Acute Rehab PT Goals Patient Stated Goal: to go home PT Goal Formulation: With patient Time For Goal Achievement: 10/27/23 Potential to Achieve Goals: Good    Frequency Min 2X/week        AM-PAC PT "6 Clicks" Mobility  Outcome Measure Help needed turning from your back to your side while in a flat bed without using bedrails?: A Little Help needed moving from lying on your back to sitting on the side of a flat bed without using bedrails?: A Little Help needed moving to and from a bed to a chair (including a wheelchair)?: A Little Help needed standing up from a  chair using your arms (e.g., wheelchair or bedside chair)?: A Little Help needed to walk in hospital room?: A Little Help needed climbing 3-5 steps with a railing? : A Lot 6 Click Score: 17    End of Session Equipment Utilized During Treatment: Gait belt;Oxygen  Activity Tolerance: Patient tolerated treatment well Patient left: in bed;with call bell/phone within reach Nurse Communication: Mobility status;Other (comment) (O2 sats) PT Visit Diagnosis: Unsteadiness on feet (R26.81);History of falling (Z91.81);Muscle weakness (generalized) (M62.81)    Time: 1914-7829 PT Time Calculation (min) (ACUTE ONLY): 23 min   Charges:   PT Evaluation $PT Eval Moderate Complexity: 1 Mod   PT General Charges $$ ACUTE PT VISIT: 1 Visit        Orysia Blas, PT, DPT Secure Chat Preferred  Rehab Office [phone]   Alissa April Adela Ades 10/13/2023, 9:35 AM

## 2023-10-13 NOTE — TOC Initial Note (Signed)
 Transition of Care [facility]) - Initial/Assessment Note    Patient Details  Name: Matthew Edwards MRN: [id number] Date of Birth: Sep 20, 1940  [facility]) CM/SW Contact:    Dexter Forest, LCSW Phone Number: 10/13/2023, 11:41 AM  Clinical Narrative:                 CSW attempted to reach patient's daughter without success.  CSW spoke with patient's son Azaryah who states patient technically lives alone but he and his sister assist with providing care for him. Piere states patient lives right across the street from his sister and is never left alone. Johnell agreeable for CSW to initiate SNF workup at this time. Akashdeep states he will reach out to his sister to have her return call to CSW.  CSW will complete FL2.  Expected Discharge Plan: Skilled Nursing Facility Barriers to Discharge: Continued Medical Work up, English as a second language teacher   Patient Goals and CMS Choice            Expected Discharge Plan and Services       Living arrangements for the past 2 months: Apartment                                      Prior Living Arrangements/Services Living arrangements for the past 2 months: Apartment Lives with:: Self, Adult Children Patient language and need for interpreter reviewed:: Yes        Need for Family Participation in Patient Care: Yes (Comment) Care giver support system in place?: Yes (comment)   Criminal Activity/Legal Involvement Pertinent to Current Situation/Hospitalization: No - Comment as needed  Activities of Daily Living      Permission Sought/Granted                  Emotional Assessment Appearance:: Appears stated age     Orientation: : Oriented to Self, Oriented to Place Alcohol / Substance Use: Not Applicable Psych Involvement: No (comment)  Admission diagnosis:  CHF (congestive heart failure) (HCC) [I50.9] Patient Active Problem List   Diagnosis Date Noted   Chronic health problem 10/12/2023   CHF exacerbation  (HCC) 10/12/2023   Dysuria 08/13/2020   Lower extremity weakness 03/18/2020   Incontinence, feces 03/12/2020   Cerumen impaction 11/23/2019   Weight loss 11/21/2019   Adenomatous rectal polyp with high grade dysplasia s/p TEM resection 05/26/2016 05/26/2016   Spinal stenosis of lumbar region 11/28/2015   OSA (obstructive sleep apnea) 11/13/2014   Pulmonary HTN (HCC) 08/24/2014   Acute on chronic congestive heart failure with left ventricular diastolic dysfunction (HCC) 08/02/2014   Cognitive impairment 07/02/2014   S/P AVR 12/29/2012   Atrial fibrillation (HCC)    Type 2 diabetes mellitus with diabetic neuropathy, unspecified (HCC) 11/25/2011   Vitamin D  deficiency 02/21/2010   Hypercalcemia 05/29/2009   Essential hypertension 02/23/2007   Coronary atherosclerosis 12/15/2006   GERD 12/15/2006   COPD (chronic obstructive pulmonary disease) (HCC) 12/10/2006   HLD (hyperlipidemia) 07/29/2006   Alcohol abuse 07/29/2006   Aortic valve disorder 07/29/2006   PCP:  Ulysees Gander, MD Pharmacy:   [facility] - [place], [place] - [address] [place] [address] Phone: [phone] Fax: [phone]  [facility] - [address] ([place]), [place] - [address]  ([place]) [place] [address] Phone: [phone] Fax: [phone]  [id number] [place], [place] -  [address] [place] [address] Phone: [phone] Fax: [phone]  [facility] - [place], [place] - [address] [place] [address] Phone: [phone] Fax: [phone]  [facility]) - [place], [place] - [address] [facility] [address] [place] [address] [place] [address] Phone: [phone] Fax: [phone]  [facility] [address], [place] - [address] [place] [address] Phone: [phone] Fax: [phone]  [facility] - [place], [place] - [address] [place] [address] Phone: [phone] Fax: [phone]  [id number] [place], [place] - [address] AT [facility] [address] [place] [address] Phone: [phone] Fax: [phone]  [facility] [address] [place] [address] Phone: [phone] Fax: [phone]     Social Drivers of Health (SDOH) Social History: SDOH Screenings   Food Insecurity: No Food Insecurity (12/09/2022)   Received from [facility] Needs: No Transportation Needs (12/09/2022)   Received from [facility]  Utilities: Not At Risk (12/09/2022)   [facility] from [facility]  Depression (PHQ2-9): Medium Risk (11/19/2020)  Financial Resource Strain: Low Risk  (12/09/2022)   Received from [facility]  Social Connections: Unknown (03/19/2022)   Received from [facility], [facility]  Stress: No Stress Concern Present (03/22/2022)   Received from [facility], [facility]  Tobacco Use: Medium Risk (10/12/2023)   SDOH Interventions:     Readmission Risk Interventions     No data to display

## 2023-10-13 NOTE — ED Notes (Addendum)
 Lab called about incomplete differential. According to lab, lab can not run differential without cbc and has run a recent cbc with differential.  Called left number [phone] in place of mcintrye MD secure chat called and number placed.  Lab states they can and will do technologist smear.

## 2023-10-14 ENCOUNTER — Other Ambulatory Visit (HOSPITAL_COMMUNITY): Payer: Self-pay

## 2023-10-14 DIAGNOSIS — R931 Abnormal findings on diagnostic imaging of heart and coronary circulation: Secondary | ICD-10-CM

## 2023-10-14 DIAGNOSIS — I2510 Atherosclerotic heart disease of native coronary artery without angina pectoris: Secondary | ICD-10-CM

## 2023-10-14 DIAGNOSIS — I5033 Acute on chronic diastolic (congestive) heart failure: Secondary | ICD-10-CM

## 2023-10-14 DIAGNOSIS — J9601 Acute respiratory failure with hypoxia: Secondary | ICD-10-CM | POA: Diagnosis not present

## 2023-10-14 DIAGNOSIS — I4821 Permanent atrial fibrillation: Secondary | ICD-10-CM

## 2023-10-14 DIAGNOSIS — F0390 Unspecified dementia without behavioral disturbance: Secondary | ICD-10-CM

## 2023-10-14 DIAGNOSIS — E119 Type 2 diabetes mellitus without complications: Secondary | ICD-10-CM

## 2023-10-14 DIAGNOSIS — I509 Heart failure, unspecified: Secondary | ICD-10-CM | POA: Diagnosis not present

## 2023-10-14 DIAGNOSIS — D151 Benign neoplasm of heart: Secondary | ICD-10-CM

## 2023-10-14 DIAGNOSIS — Z952 Presence of prosthetic heart valve: Secondary | ICD-10-CM

## 2023-10-14 DIAGNOSIS — J9691 Respiratory failure, unspecified with hypoxia: Secondary | ICD-10-CM | POA: Diagnosis not present

## 2023-10-14 DIAGNOSIS — Z7901 Long term (current) use of anticoagulants: Secondary | ICD-10-CM

## 2023-10-14 DIAGNOSIS — Z955 Presence of coronary angioplasty implant and graft: Secondary | ICD-10-CM

## 2023-10-14 DIAGNOSIS — I10 Essential (primary) hypertension: Secondary | ICD-10-CM | POA: Diagnosis not present

## 2023-10-14 LAB — PROTIME-INR
INR: 2.7 — ABNORMAL HIGH (ref 0.8–1.2)
Prothrombin Time: 28.7 s — ABNORMAL HIGH (ref 11.4–15.2)

## 2023-10-14 LAB — BASIC METABOLIC PANEL WITH GFR
Anion gap: 8 (ref 5–15)
BUN: 13 mg/dL (ref 8–23)
CO2: 30 mmol/L (ref 22–32)
Calcium: 9.5 mg/dL (ref 8.9–10.3)
Chloride: 104 mmol/L (ref 98–111)
Creatinine, Ser: 1.01 mg/dL (ref 0.61–1.24)
GFR, Estimated: >60 mL/min (ref >60)
Glucose, Bld: 94 mg/dL (ref 70–99)
Potassium: 4.1 mmol/L (ref 3.5–5.1)
Sodium: 142 mmol/L (ref 135–145)

## 2023-10-14 LAB — MAGNESIUM: Magnesium: 2 mg/dL (ref 1.7–2.4)

## 2023-10-14 MED ORDER — FUROSEMIDE 20 MG PO TABS
20.0000 mg | ORAL_TABLET | Freq: Every day | ORAL | Status: DC
  Administered 2023-10-14 – 2023-10-18 (×5): 20 mg via ORAL
  Filled 2023-10-14 (×5): qty 1

## 2023-10-14 MED ORDER — WARFARIN SODIUM 5 MG PO TABS
5.0000 mg | ORAL_TABLET | Freq: Once | ORAL | Status: AC
  Administered 2023-10-14: 5 mg via ORAL
  Filled 2023-10-14: qty 1

## 2023-10-14 NOTE — TOC Progression Note (Addendum)
 Transition of Care [facility]) - Progression Note    Patient Details  Name: Matthew Edwards MRN: [id number] Date of Birth: 07-25-1940  [facility]) CM/SW Contact  Arron Big, [place] Phone Number: 10/14/2023, 11:18 AM  Clinical Narrative:   CSW spoke with [name], pts dtr. She states she is patients POA and would like ocmmunications to go through her. CSW informed Robinette Chou to bring POA paperwork to hospital to go on patients chart. Robinette Chou states she lives across the street from patient but also lives with him when need be in his house.   CSW discussed PT recs for SNF. Robinette Chou is agreeable to SNF at this time and stated patient has previously been to [facility]. She also mentioned they would like [facility] if they have bed availability. Robinette Chou stated CSW can email bed offers to [email or web address].   Robinette Chou inquired about applying for Medicaid for patient. CSW informed financial counseling that she would like to apply for Medicaid for patient.   2:07 PM CSW emailed list of medicare.gov ratings of accepting SNFs.          Skilled Nursing Rehab Facilities-   [email or web address]     Ratings out of 5 stars (5 the highest)    Name Address  Phone # Quality Care Staffing Health Inspection Overall  [facility] [address] [place], [place] [phone] 5 1 4 4   [facility] [address], [facility] [place] [facility] [phone] [facility] [address], [facility] [phone] 4 2 2 2   [facility] [address] [place] [phone] 5 1 2 2   [facility] [address], [place] [phone] 2 1 3 2   [address] ([place]) [address] [place] [phone] 2 3 3 3   [facility] [address] [place], [place] [phone] 3 3 2 2   [facility] ([place]) [address] [place], [place] [phone] 3 1 1 1     TOC will continue to follow.    Expected Discharge Plan: Skilled Nursing  Facility Barriers to Discharge: Continued Medical Work up, SNF Pending bed offer, Insurance Authorization  Expected Discharge Plan and Services       Living arrangements for the past 2 months: Apartment                                       Social Determinants of Health (SDOH) Interventions SDOH Screenings   Food Insecurity: No Food Insecurity (10/13/2023)  Housing: Low Risk  (10/13/2023)  Transportation Needs: No Transportation Needs (10/13/2023)  Utilities: Not At Risk (10/13/2023)  Depression (PHQ2-9): Medium Risk (11/19/2020)  Financial Resource Strain: Low Risk  (12/09/2022)   Received from [facility]  Social Connections: Unknown (10/13/2023)  Stress: No Stress Concern Present (03/22/2022)   Received from [facility], [facility]  Tobacco Use: Medium Risk (10/12/2023)    Readmission Risk Interventions     No data to display

## 2023-10-14 NOTE — TOC Benefit Eligibility Note (Signed)
 Pharmacy Patient Advocate Encounter  Insurance verification completed.    The patient is insured through [employer]. Patient has Medicare and is not eligible for a copay card, but may be able to apply for patient assistance or Medicare RX Payment Plan (Patient Must reach out to their plan, if eligible for payment plan), if available.    Ran test claim for Farxiga 10mg  and the current 30 day co-pay is $254.21. ($207.21 deductible and $47 copay)   Ran test claim for Jardiance 10mg  and the current 30 day co-pay is $254.21. ($207.21 deductible and $47 copay)    This test claim was processed through [facility]- copay amounts may vary at other pharmacies due to [employer], or as the patient moves through the different stages of their insurance plan.

## 2023-10-14 NOTE — Progress Notes (Signed)
 Daily Progress Note Intern Pager: [phone]  Patient name: Matthew Edwards Medical record number: [id number] Date of birth: 12/07/1940 Age: 83 y.o. 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  Did not reach daughter yesterday, plan to call again today. Assessment & Plan Acute hypoxemic respiratory failure (HCC) Repeat echo with unchanged EF, but biatrial dilation and small echodensity concerning for papillary fibroelastoma in LVOT.  Appears to be euvolemic today, but difficult to verify with unreliable I&Os.  Was able to wean to room air in the room, will see if that persists.  Home regimen Lasix  20 mg daily. 24 hour UOP not documented, will follow up with nursing. Weight at 72.6 kg from 71.7 kg, though down from 79.9 kg on admission. K and Mag at goal.  Creatinine mildly bumped to 1 from 0.7, will monitor closely. - Supplement O2 to goal O2 88-92%, weaned to room air this AM - Continuous cardiac monitoring, continuous pulse oximetry - Continue home metoprolol  100 mg daily - Restart home Lasix  20 mg PO - AM BMP, Mag; goal K>4, Mag>2 - Consulted Cardiology, follow up recommendations Chronic health problem Dementia: Continue home donepezil  10 mg at bedtime; reorder home seroquel  50 mg QHS given concern for confusion Atrial fibrillation: Metoprolol  as above, on warfarin at home given aortic valve replacement; Warfarin per pharmacy dosing COPD:  Continue Breo Ellipta  daily, O2 goal adjusted to 88-92%  FEN/GI: Carb modified/heart healthy PPx: Warfarin per pharmacy Dispo: SNF pending Cardiology workup.  Subjective:  Able to communicate with patient using stethoscope in his ears.  He reports that he is feeling well this morning, nothing is bothering him in particular.  Denies any generalized pain.  Denies dyspnea, chest pain, palpitations.   Objective: Temp:  [97.9 F (36.6 C)-98.1 F (36.7 C)] 97.9 F (36.6 C) (05/15 0332) Pulse Rate:  [35-97] 97 (05/15 0332) Resp:  [17-27] 20 (05/15 0100) BP: (130-154)/(78-102) 137/102 (05/15 0332) SpO2:  [91 %-99 %] 91 % (05/15 0332) Weight:  [71.7 kg-72.6 kg] 72.6 kg (05/15 0500)  Physical Exam: General: Age-appropriate, resting comfortably in bed, NAD, alert and at baseline. HEENT: MMM.  Hard of hearing.  No JVD. Cardiovascular: Irregular rate and rhythm, A-fib on monitor with rare PVCs. Normal S1/S2. No murmurs, rubs, or gallops appreciated. 2+ radial pulses. Pulmonary: Diminished breath sounds, but no wheezes, crackles, or rhonchi globally. Normal WOB when weaned to room air, stable O2 sat on monitor. Abdominal: Normoactive bowel sounds, nondistended. No tenderness to deep or light palpation. No rebound or guarding. Skin: Warm and dry. Extremities: No peripheral edema bilaterally. Capillary refill <2 seconds.  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 Severe biatrial dilation.  Tayshawn Purnell Lansing Planas, MD 10/14/2023, 7:01 AM  PGY-1, [facility] FPTS Intern pager: [phone], text pages welcome Secure chat group [facility] Teaching Service

## 2023-10-14 NOTE — Assessment & Plan Note (Addendum)
 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  Appears to be euvolemic today, but difficult to verify with unreliable I&Os.  Was able to wean to room air in the room, will see if that persists.  Home regimen Lasix  20 mg daily. 24 hour UOP not documented, will follow up with nursing. Weight at 72.6 kg from 71.7 kg, though down from 79.9 kg on admission. K and Mag at goal.  Creatinine mildly bumped to 1 from 0.7, will monitor closely. - Supplement O2 to goal O2 88-92%, weaned to room air this AM - Continuous cardiac monitoring, continuous pulse oximetry - Continue home metoprolol  100 mg daily - Restart home Lasix  20 mg PO - AM BMP, Mag; goal K>4, Mag>2 - Consulted Cardiology, follow up recommendations

## 2023-10-14 NOTE — Plan of Care (Signed)
  Problem: Health Behavior/Discharge Planning: Goal: Ability to manage health-related needs will improve Outcome: Progressing   Problem: Clinical Measurements: Goal: Respiratory complications will improve Outcome: Progressing   Problem: Activity: Goal: Risk for activity intolerance will decrease Outcome: Progressing   

## 2023-10-14 NOTE — Progress Notes (Signed)
 PHARMACY - ANTICOAGULATION CONSULT NOTE  Pharmacy Consult for Warfarin Indication: AFIB, bioprosthetic AVR   Allergies  Allergen Reactions   Pineapple Concentrate Nausea And Vomiting   Patient Measurements: Height: 5\' 9"  (175.3 cm) Weight: 72.6 kg (160 lb 0.9 oz) IBW/kg (Calculated) : 70.7 HEPARIN DW (KG): 71.7  Vital Signs: Temp: 97.7 F (36.5 C) (05/15 0743) Temp Source: Oral (05/15 0743) BP: 134/81 (05/15 0743) Pulse Rate: 84 (05/15 0743)  Labs: Recent Labs    10/12/23 1755 10/12/23 2125 10/13/23 0530 10/14/23 0306  HGB 12.3*  --  12.0*  --   HCT 40.7  --  40.7  --   PLT 156  --  149*  --   LABPROT  --  31.1* 31.0* 28.7*  INR  --  3.0* 3.0* 2.7*  CREATININE 0.84  --  0.71 1.01  TROPONINIHS 10 9  --   --     Estimated Creatinine Clearance: 58.3 mL/min (by C-G formula based on SCr of 1.01 mg/dL).  Medications:  Scheduled:   donepezil   10 mg Oral QHS   fluticasone  furoate-vilanterol  1 puff Inhalation Daily   metoprolol  succinate  100 mg Oral Daily   warfarin  5 mg Oral ONCE-1600   Warfarin - Pharmacist Dosing Inpatient   Does not apply q1600   Assessment: 83 YO M presented 5/13 via EMS with dyspnea and concern for CHF exacerbation. PTA warfarin 5 mg every day (35 mg weekly) for AFIB and bioprosthetic AVR. Pharmacy consulted for warfarin management.   5/13: INR 2.7 (therapeutic)   Goal of Therapy:  INR 2-3 Monitor platelets by anticoagulation protocol: Yes   Plan:  Warfarin 5 mg x 1 today F/U daily INR, new drug-drug interactions, and warfarin dosing  Rockie Churchman 10/14/2023,9:02 AM

## 2023-10-14 NOTE — Assessment & Plan Note (Signed)
 Dementia: Continue home donepezil  10 mg at bedtime; reorder home seroquel  50 mg QHS given concern for confusion Atrial fibrillation: Metoprolol  as above, on warfarin at home given aortic valve replacement; Warfarin per pharmacy dosing COPD: Continue Breo Ellipta  daily, O2 goal adjusted to 88-92%

## 2023-10-14 NOTE — Plan of Care (Signed)
 Called patient's daughter, Matthew Edwards.  Updated on inpatient progress, plans, and care thus far.  Answered questions.  TOC working on SNF placement.  Robinette Chou reports patient only missed 3 days of Lasix  and had asked her to stop giving it due to some enuresis.  Does not have hearing aids that he wears.

## 2023-10-14 NOTE — Consult Note (Addendum)
 Cardiology Consultation   Patient ID: ARAGON GULDEN MRN: [id number]; DOB: Dec 05, 1940  Admit date: 10/12/2023 Date of Consult: 10/14/2023  PCP:  Ulysees Gander, MD   [facility] Providers Cardiologist: Dr. Peter Swaziland    Patient Profile:   Matthew Edwards is a 83 y.o. male with a hx of CAD, chronic atrial fibrillation, COPD, GERD, hypertension, hyperlipidemia, type 2 diabetes, OSA, s/p AVR, dementia, COPD who is being seen 10/14/2023 for the evaluation of abnormal echocardiogram at the request of Dr. Grandville Lax.  History of Present Illness:   Matthew Edwards is an 83 year old male with above medical history.  Patient was previously followed by Dr. Swaziland but has not been seen by cardiology since 2021.  In 2012, patient underwent aortic valve replacement with a 25 mm Edwards pericardial valve and had LIMA- LAD CABG.  He also has a known history of permanent atrial fibrillation and has been on Coumadin .  His most recent echocardiogram from 12/2019 showed EF 60-65%, mild asymmetric LVH, moderately reduced RV systolic function with moderately elevated PA systolic pressure, severe biatrial dilation, mild MR, moderate-severe TR.  There was a 25 mm Edwards pericardial valve present in the aortic position without aortic regurgitation present.  He has not been seen by cardiology since 2021  Per chart review, appears that patient was admitted in 12/2021, 03/2022, 05/2022, 01/2023 with falls.  He was also seen in the ED on 06/17/2023 after his INR was 10 with his PCP.  INR was rechecked and his INR was 5.5.  Coumadin  dose was adjusted.  Again seen in the ED 09/24/2023 after he had a fall in his home.  CT scan negative for ICH.  Cervical spine without fracture.  He was discharged from the ED.  Patient presented to the ED on 5/13 for evaluation of pitting edema in the lower extremities and increased drowsiness.  Per EMS, patient's daughter withheld the medication because it was making him urinate too much.   In the ED, chest x-ray showed cardiomegaly with findings consistent with CHF, small right pleural effusion.  BNP elevated to 176.2.  High-sensitivity troponin 10.  He was admitted to the family medicine teaching service.  Started on IV Lasix  20 mg daily.  He underwent echocardiogram 10/13/2023 that showed EF 60-65%, no regional wall motion abnormalities, mild LVH moderately reduced RV systolic function, severe biatrial enlargement, mild-moderate TR.  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    Cardiology consulted for abnormal echocardiogram findings. On interview, patient is pleasantly confused. Able to tell me his name and date of birth. Unable to tell me what year it is. Reports that we are in a "farmhouse". He does not know why he is in the hospital. Denies being in any pain    Past Medical History:  Diagnosis Date   Arthritis    Atrial fibrillation (HCC)    CAD (coronary artery disease) 2012   [place]-LAD 2 or 3 srents   Complication of anesthesia    "loopy after polpy removal dec 2017, lasted several days"   COPD (chronic obstructive pulmonary disease) (HCC)    Dementia (HCC)    GERD (gastroesophageal reflux disease)    HEART FAILURE, CONGESTIVE UNSPEC 02/23/2007   Qualifier: Diagnosis of  By: Cricket Doll MD, [facility]     Hyperlipidemia    Hypertension    OSA (obstructive sleep apnea) 11/13/2014   S/P AVR    #25  mm Edwards pericardial valve Bioprosthetic. Dr Sherene Dilling.   Situational depression    "son passed 11/2013"   Sleep apnea    occ uses c pap does not know settings    Stented coronary artery 2013   TOBACCO DEPENDENCE 07/29/2006   Qualifier: History of  By: Joni Net  MD, Stephanie     Type II diabetes mellitus [facility])     Past Surgical History:  Procedure Laterality Date   AORTIC VALVE REPLACEMENT  07/2010   notes 07/28/2010   CARDIAC CATHETERIZATION  06/2010   Maximo Spar 07/01/2010    COLONOSCOPY WITH PROPOFOL  N/A 02/08/2017   Procedure: COLONOSCOPY WITH PROPOFOL ;  Surgeon: Albertina Hugger, MD;  Location: [facility];  Service: Gastroenterology;  Laterality: N/A;   CORONARY ANGIOPLASTY WITH STENT PLACEMENT  2006; 10/2006   "2"; 1/notes 10/01/2010   CORONARY ARTERY BYPASS GRAFT  07/2010   "1"/notes 07/28/2010   PARTIAL PROCTECTOMY BY TEM N/A 05/26/2016   Procedure: TEM PARTIAL PROCTECTOMY OF RECTAL MASS;  Surgeon: Candyce Champagne, MD;  Location: [facility] ORS;  Service: General;  Laterality: N/A;      Inpatient Medications: Scheduled Meds:  donepezil   10 mg Oral QHS   fluticasone  furoate-vilanterol  1 puff Inhalation Daily   furosemide   20 mg Oral Daily   metoprolol  succinate  100 mg Oral Daily   warfarin  5 mg Oral ONCE-1600   Warfarin - Pharmacist Dosing Inpatient   Does not apply q1600   Continuous Infusions:  PRN Meds: acetaminophen  **OR** acetaminophen   Allergies:    Allergies  Allergen Reactions   Pineapple Concentrate Nausea And Vomiting    Social History:   Social History   Socioeconomic History   Marital status: Widowed    Spouse name: Not on file   Number of children: 7   Years of education: 11   Highest education level: Not on file  Occupational History   Occupation: Retired-Post Public affairs consultant: RETIRED   Occupation: [employer]  Tobacco Use   Smoking status: Former    Current packs/day: 0.50    Average packs/day: 0.5 packs/day for 46.0 years (23.0 ttl pk-yrs)    Types: Cigarettes   Smokeless tobacco: Never  Vaping Use   Vaping status: Never Used  Substance and Sexual Activity   Alcohol use: No   Drug use: No   Sexual activity: Not on file  Other Topics Concern   Not on file  Social History Narrative   Health Care POA:    Emergency Contact: Daughter, [name]   End of Life Plan:    Who lives with you: self   Any pets: none   Diet: Patient has a varied diet of protein, starch, and vegetables.   Exercise: Pt has not regular exercise  routine.   Seatbelts: Pt reports wearing seatbelt when in vehile.   Paulene Boron Exposure/Protection:    Hobbies: fishing, tv      Daughter Addie Sistare [phone] - main caretaker currently    Eissa Rostad    Social Drivers of Health   Financial Resource Strain: Low Risk  (12/09/2022)   Received from [facility]   Overall Financial Resource Strain (CARDIA)    Difficulty of Paying Living Expenses: Not hard at all  Food Insecurity: No Food Insecurity (10/13/2023)   Hunger Vital Sign    Worried About Running Out of Food in the Last Year: Never true    Ran Out of Food in the Last Year: Never true  Transportation Needs: No Transportation Needs (  10/13/2023)   PRAPARE - Administrator, Civil Service (Medical): No    Lack of Transportation (Non-Medical): No  Physical Activity: Not on file  Stress: No Stress Concern Present (03/22/2022)   Received from [facility]ational [facility] - Occupational Stress Questionnaire    Feeling of Stress : Not at all  Social Connections: Unknown (10/13/2023)   Social Connection and Isolation Panel [NHANES]    Frequency of Communication with Friends and Family: Never    Frequency of Social Gatherings with Friends and Family: Never    Attends Religious Services: Not on Marketing executive or Organizations: No    Attends Banker Meetings: Never    Marital Status: Widowed  Intimate Partner Violence: Not At Risk (10/13/2023)   Humiliation, Afraid, Rape, and Kick questionnaire    Fear of Current or Ex-Partner: No    Emotionally Abused: No    Physically Abused: No    Sexually Abused: No    Family History:    Family History  Problem Relation Age of Onset   Heart disease Mother    Colon cancer Neg Hx    Esophageal cancer Neg Hx    Stomach cancer Neg Hx    Pancreatic cancer Neg Hx    Liver disease Neg Hx    Colon polyps Neg Hx      ROS:  Please see the history of present illness.    All other ROS reviewed and negative.     Physical Exam/Data:   Vitals:   10/14/23 0332 10/14/23 0500 10/14/23 0743 10/14/23 1110  BP: (!) 137/102  134/81 126/69  Pulse: 97  84 74  Resp:   19 17  Temp: 97.9 F (36.6 C)  97.7 F (36.5 C) 97.8 F (36.6 C)  TempSrc: Oral  Oral Oral  SpO2: 91%  94% 98%  Weight:  72.6 kg    Height:        Intake/Output Summary (Last 24 hours) at 10/14/2023 1423 Last data filed at 10/14/2023 1231 Gross per 24 hour  Intake 2954 ml  Output --  Net 2954 ml      10/14/2023    5:00 AM 10/13/2023    5:34 PM 10/12/2023   10:26 PM  Last 3 Weights  Weight (lbs) 160 lb 0.9 oz 158 lb 1.1 oz 176 lb 2.4 oz  Weight (kg) 72.6 kg 71.7 kg 79.9 kg     Body mass index is 23.64 kg/m.  General:  Thin elderly male. Laying in the bed with head elevated. No acute distress  HEENT: normal Neck: no JVD Cardiac:  normal S1, S2; Irregular rate and rhythm, no murmurs  Lungs:  clear to auscultation bilaterally. Normal WOB on room air  Abd: soft, nontender  Ext: no edema in BLE  Musculoskeletal:  No deformities Skin: warm and dry  Neuro:  CNs 2-12 intact, no focal abnormalities noted Psych:  Normal affect   EKG:  The EKG was personally reviewed and demonstrates:  atrial fibrillation with PVCs, HR 80 BPM  Telemetry:  Telemetry was personally reviewed and demonstrates:  Atrial Fibrillation with HR in the 60s-70s   Relevant CV Studies: Cardiac Studies & Procedures   ______________________________________________________________________________________________     ECHOCARDIOGRAM  ECHOCARDIOGRAM COMPLETE 10/13/2023  Narrative ECHOCARDIOGRAM REPORT    Patient Name:   Matthew Edwards Date of Exam: 10/13/2023 Medical Rec #:  [id number]       Height:  69.0 in Accession #:    [id number]      Weight:       176.1 lb Date of Birth:  04-Oct-1940       BSA:          1.957 m Patient Age:    82 years        BP:           150/101 mmHg Patient Gender: M                HR:           72 bpm. Exam Location:  Inpatient  Procedure: 2D Echo, Cardiac Doppler and Color Doppler (Both Spectral and Color Flow Doppler were utilized during procedure).  Indications:    CHF  History:        Patient has prior history of Echocardiogram examinations, most recent 01/07/2020. Prior CABG, COPD, Aortic Valve Disease, Arrythmias:Atrial Fibrillation; Risk Factors:Hypertension, Diabetes and Dyslipidemia. Aortic Valve: 25 mm Edwards pericardial valve is present in the aortic position. Procedure Date: 2012.  Sonographer:    Sulema Endo RDCS, FE, PE Referring Phys: [id number] MARGARET E PRAY  IMPRESSIONS   1. Small 0.5 x 0.25 cm mobile echodensity in the left ventricular outflow tract. On serial comparison to study from 2021, this may have been present but was less well visualized on prior exam. 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 Mobile echodensity is distant from the aortic valve prosthetic leaflets and is attached to the basal septum. Best visualized clips 94-96. 2. Left ventricular ejection fraction, by estimation, is 60 to 65%. The left ventricle has normal function. The left ventricle has no regional wall motion abnormalities. There is mild left ventricular hypertrophy. Left ventricular diastolic parameters are indeterminate. 3. Right ventricular systolic function is moderately reduced. The right ventricular size is moderately enlarged. There is moderately elevated pulmonary artery systolic pressure. The estimated right ventricular systolic pressure is 46.7 mmHg. 4. Left atrial size was severely dilated. 5. Right atrial size was severely dilated. 6. The mitral valve is degenerative. Mild to moderate mitral valve regurgitation. Mild to moderate mitral stenosis. The mean mitral valve gradient is 7.0 mmHg with average heart rate of 106 bpm. Severe mitral annular calcification. 7. Tricuspid valve regurgitation is mild to moderate. 8. The aortic valve is normal  in structure. Aortic valve regurgitation is not visualized. No aortic stenosis is present. There is a 25 mm Edwards pericardial valve present in the aortic position. Procedure Date: 2012. Echo findings are consistent with normal structure and function of the aortic valve prosthesis. Aortic valve area, by VTI measures 3.60 cm. Aortic valve mean gradient measures 3.5 mmHg. Aortic valve Vmax measures 1.26 m/s. 9. The inferior vena cava is dilated in size with >50% respiratory variability, suggesting right atrial pressure of 8 mmHg.  FINDINGS Left Ventricle: Left ventricular ejection fraction, by estimation, is 60 to 65%. The left ventricle has normal function. The left ventricle has no regional wall motion abnormalities. The left ventricular internal cavity size was normal in size. There is mild left ventricular hypertrophy. Left ventricular diastolic parameters are indeterminate.  Right Ventricle: The right ventricular size is moderately enlarged. No increase in right ventricular wall thickness. Right ventricular systolic function is moderately reduced. There is moderately elevated pulmonary artery systolic pressure. The tricuspid regurgitant velocity is 3.11 m/s, and with an assumed right atrial pressure of 8 mmHg, the estimated right ventricular systolic pressure is 46.7 mmHg.  Left Atrium: Left atrial size was severely dilated.  Right  Atrium: Right atrial size was severely dilated.  Pericardium: There is no evidence of pericardial effusion.  Mitral Valve: The mitral valve is degenerative in appearance. Severe mitral annular calcification. Mild to moderate mitral valve regurgitation. Mild to moderate mitral valve stenosis. MV peak gradient, 18.7 mmHg. The mean mitral valve gradient is 7.0 mmHg with average heart rate of 106 bpm.  Tricuspid Valve: The tricuspid valve is normal in structure. Tricuspid valve regurgitation is mild to moderate. No evidence of tricuspid stenosis.  Aortic Valve: The  aortic valve is normal in structure. Aortic valve regurgitation is not visualized. No aortic stenosis is present. Aortic valve mean gradient measures 3.5 mmHg. Aortic valve peak gradient measures 6.4 mmHg. Aortic valve area, by VTI measures 3.60 cm. There is a 25 mm Edwards pericardial valve present in the aortic position. Procedure Date: 2012. Echo findings are consistent with normal structure and function of the aortic valve prosthesis.  Pulmonic Valve: The pulmonic valve was normal in structure. Pulmonic valve regurgitation is trivial. No evidence of pulmonic stenosis.  Aorta: The aortic root is normal in size and structure.  Venous: The inferior vena cava is dilated in size with greater than 50% respiratory variability, suggesting right atrial pressure of 8 mmHg.  IAS/Shunts: No atrial level shunt detected by color flow Doppler.   LEFT VENTRICLE PLAX 2D LVIDd:         4.50 cm   Diastology LVIDs:         3.10 cm   LV e' medial:    6.96 cm/s LV PW:         1.00 cm   LV E/e' medial:  30.0 LV IVS:        1.30 cm   LV e' lateral:   12.00 cm/s LVOT diam:     2.50 cm   LV E/e' lateral: 17.4 LV SV:         83 LV SV Index:   43 LVOT Area:     4.91 cm   RIGHT VENTRICLE RV Basal diam:  4.40 cm RV Mid diam:    3.90 cm RV S prime:     10.40 cm/s TAPSE (M-mode): 1.5 cm  LEFT ATRIUM              Index        RIGHT ATRIUM           Index LA diam:        5.20 cm  2.66 cm/m   RA Area:     30.50 cm LA Vol (A2C):   103.0 ml 52.62 ml/m  RA Volume:   117.00 ml 59.77 ml/m LA Vol (A4C):   132.0 ml 67.43 ml/m LA Biplane Vol: 116.0 ml 59.26 ml/m AORTIC VALVE AV Area (Vmax):    3.37 cm AV Area (Vmean):   3.27 cm AV Area (VTI):     3.60 cm AV Vmax:           126.25 cm/s AV Vmean:          86.975 cm/s AV VTI:            0.232 m AV Peak Grad:      6.4 mmHg AV Mean Grad:      3.5 mmHg LVOT Vmax:         86.60 cm/s LVOT Vmean:        57.900 cm/s LVOT VTI:          0.170 m LVOT/AV VTI  ratio: 0.73  AORTA Ao  Root diam: 2.30 cm Ao Asc diam:  3.30 cm  MITRAL VALVE                TRICUSPID VALVE MV Area (PHT): 2.71 cm     TR Peak grad:   38.7 mmHg MV Peak grad:  18.7 mmHg    TR Vmax:        311.00 cm/s MV Mean grad:  7.0 mmHg MV Vmax:       2.16 m/s     SHUNTS MV Vmean:      119.5 cm/s   Systemic VTI:  0.17 m MV Decel Time: 280 msec     Systemic Diam: 2.50 cm MV E velocity: 209.00 cm/s  Grady Lawman MD Electronically signed by Grady Lawman MD Signature Date/Time: 10/13/2023/4:22:21 PM    Final          ______________________________________________________________________________________________       Laboratory Data:  High Sensitivity Troponin:   Recent Labs  Lab 10/12/23 1755 10/12/23 2125  TROPONINIHS 10 9     Chemistry Recent Labs  Lab 10/12/23 1755 10/13/23 0530 10/14/23 0306  NA 145 143 142  K 3.9 3.4* 4.1  CL 108 105 104  CO2 30 31 30   GLUCOSE 84 105* 94  BUN 10 9 13   CREATININE 0.84 0.71 1.01  CALCIUM  9.4 9.2 9.5  MG  --   --  2.0  GFRNONAA >60 >60 >60  ANIONGAP 7 7 8     Recent Labs  Lab 10/12/23 1755  PROT 6.9  ALBUMIN 3.4*  AST 27  ALT 13  ALKPHOS 65  BILITOT 1.0   Lipids No results for input(s): "CHOL", "TRIG", "HDL", "LABVLDL", "LDLCALC", "CHOLHDL" in the last 168 hours.  Hematology Recent Labs  Lab 10/12/23 1755 10/13/23 0530  WBC 5.4 6.5  RBC 4.53 4.57  HGB 12.3* 12.0*  HCT 40.7 40.7  MCV 89.8 89.1  MCH 27.2 26.3  MCHC 30.2 29.5*  RDW 17.3* 17.0*  PLT 156 149*   Thyroid  No results for input(s): "TSH", "FREET4" in the last 168 hours.  BNP Recent Labs  Lab 10/12/23 1755  BNP 176.2*    DDimer No results for input(s): "DDIMER" in the last 168 hours.   Radiology/Studies:  ECHOCARDIOGRAM COMPLETE Result Date: 10/13/2023    ECHOCARDIOGRAM REPORT   Patient Name:   RODOLPH PATILLO Date of Exam: 10/13/2023 Medical Rec #:  [id number]       Height:       69.0 in Accession #:    [id number]       Weight:       176.1 lb Date of Birth:  04-10-1941       BSA:          1.957 m Patient Age:    82 years        BP:           150/101 mmHg Patient Gender: M               HR:           72 bpm. Exam Location:  Inpatient Procedure: 2D Echo, Cardiac Doppler and Color Doppler (Both Spectral and Color            Flow Doppler were utilized during procedure). Indications:    CHF  History:        Patient has prior history of Echocardiogram examinations, most                 recent 01/07/2020.  Prior CABG, COPD, Aortic Valve Disease,                 Arrythmias:Atrial Fibrillation; Risk Factors:Hypertension,                 Diabetes and Dyslipidemia.                 Aortic Valve: 25 mm Edwards pericardial valve is present in the                 aortic position. Procedure Date: 2012.  Sonographer:    Sulema Endo RDCS, FE, PE Referring Phys: [id number] MARGARET E PRAY IMPRESSIONS  1. Small 0.5 x 0.25 cm mobile echodensity in the left ventricular outflow tract. On serial comparison to study from 2021, this may have been present but was less well visualized on prior exam. 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 Mobile echodensity is distant from the aortic valve prosthetic leaflets and is attached to the basal septum. Best visualized clips 94-96.  2. Left ventricular ejection fraction, by estimation, is 60 to 65%. The left ventricle has normal function. The left ventricle has no regional wall motion abnormalities. There is mild left ventricular hypertrophy. Left ventricular diastolic parameters are indeterminate.  3. Right ventricular systolic function is moderately reduced. The right ventricular size is moderately enlarged. There is moderately elevated pulmonary artery systolic pressure. The estimated right ventricular systolic pressure is 46.7 mmHg.  4. Left atrial size was severely dilated.  5. Right atrial size was severely dilated.  6. The mitral valve is degenerative. Mild to moderate mitral valve regurgitation.  Mild to moderate mitral stenosis. The mean mitral valve gradient is 7.0 mmHg with average heart rate of 106 bpm. Severe mitral annular calcification.  7. Tricuspid valve regurgitation is mild to moderate.  8. The aortic valve is normal in structure. Aortic valve regurgitation is not visualized. No aortic stenosis is present. There is a 25 mm Edwards pericardial valve present in the aortic position. Procedure Date: 2012. Echo findings are consistent with normal structure and function of the aortic valve prosthesis. Aortic valve area, by VTI measures 3.60 cm. Aortic valve mean gradient measures 3.5 mmHg. Aortic valve Vmax measures 1.26 m/s.  9. The inferior vena cava is dilated in size with >50% respiratory variability, suggesting right atrial pressure of 8 mmHg. FINDINGS  Left Ventricle: Left ventricular ejection fraction, by estimation, is 60 to 65%. The left ventricle has normal function. The left ventricle has no regional wall motion abnormalities. The left ventricular internal cavity size was normal in size. There is  mild left ventricular hypertrophy. Left ventricular diastolic parameters are indeterminate. Right Ventricle: The right ventricular size is moderately enlarged. No increase in right ventricular wall thickness. Right ventricular systolic function is moderately reduced. There is moderately elevated pulmonary artery systolic pressure. The tricuspid  regurgitant velocity is 3.11 m/s, and with an assumed right atrial pressure of 8 mmHg, the estimated right ventricular systolic pressure is 46.7 mmHg. Left Atrium: Left atrial size was severely dilated. Right Atrium: Right atrial size was severely dilated. Pericardium: There is no evidence of pericardial effusion. Mitral Valve: The mitral valve is degenerative in appearance. Severe mitral annular calcification. Mild to moderate mitral valve regurgitation. Mild to moderate mitral valve stenosis. MV peak gradient, 18.7 mmHg. The mean mitral valve gradient is  7.0 mmHg with average heart rate of 106 bpm. Tricuspid Valve: The tricuspid valve is normal in structure. Tricuspid valve regurgitation is mild to moderate. No evidence of tricuspid stenosis.  Aortic Valve: The aortic valve is normal in structure. Aortic valve regurgitation is not visualized. No aortic stenosis is present. Aortic valve mean gradient measures 3.5 mmHg. Aortic valve peak gradient measures 6.4 mmHg. Aortic valve area, by VTI measures 3.60 cm. There is a 25 mm Edwards pericardial valve present in the aortic position. Procedure Date: 2012. Echo findings are consistent with normal structure and function of the aortic valve prosthesis. Pulmonic Valve: The pulmonic valve was normal in structure. Pulmonic valve regurgitation is trivial. No evidence of pulmonic stenosis. Aorta: The aortic root is normal in size and structure. Venous: The inferior vena cava is dilated in size with greater than 50% respiratory variability, suggesting right atrial pressure of 8 mmHg. IAS/Shunts: No atrial level shunt detected by color flow Doppler.  LEFT VENTRICLE PLAX 2D LVIDd:         4.50 cm   Diastology LVIDs:         3.10 cm   LV e' medial:    6.96 cm/s LV PW:         1.00 cm   LV E/e' medial:  30.0 LV IVS:        1.30 cm   LV e' lateral:   12.00 cm/s LVOT diam:     2.50 cm   LV E/e' lateral: 17.4 LV SV:         83 LV SV Index:   43 LVOT Area:     4.91 cm  RIGHT VENTRICLE RV Basal diam:  4.40 cm RV Mid diam:    3.90 cm RV S prime:     10.40 cm/s TAPSE (M-mode): 1.5 cm LEFT ATRIUM              Index        RIGHT ATRIUM           Index LA diam:        5.20 cm  2.66 cm/m   RA Area:     30.50 cm LA Vol (A2C):   103.0 ml 52.62 ml/m  RA Volume:   117.00 ml 59.77 ml/m LA Vol (A4C):   132.0 ml 67.43 ml/m LA Biplane Vol: 116.0 ml 59.26 ml/m  AORTIC VALVE AV Area (Vmax):    3.37 cm AV Area (Vmean):   3.27 cm AV Area (VTI):     3.60 cm AV Vmax:           126.25 cm/s AV Vmean:          86.975 cm/s AV VTI:            0.232 m  AV Peak Grad:      6.4 mmHg AV Mean Grad:      3.5 mmHg LVOT Vmax:         86.60 cm/s LVOT Vmean:        57.900 cm/s LVOT VTI:          0.170 m LVOT/AV VTI ratio: 0.73  AORTA Ao Root diam: 2.30 cm Ao Asc diam:  3.30 cm MITRAL VALVE                TRICUSPID VALVE MV Area (PHT): 2.71 cm     TR Peak grad:   38.7 mmHg MV Peak grad:  18.7 mmHg    TR Vmax:        311.00 cm/s MV Mean grad:  7.0 mmHg MV Vmax:       2.16 m/s     SHUNTS MV Vmean:  119.5 cm/s   Systemic VTI:  0.17 m MV Decel Time: 280 msec     Systemic Diam: 2.50 cm MV E velocity: 209.00 cm/s Grady Lawman MD Electronically signed by Grady Lawman MD Signature Date/Time: 10/13/2023/4:22:21 PM    Final    DG Chest Port 1 View Result Date: 10/12/2023 CLINICAL DATA:  Weakness. EXAM: PORTABLE CHEST 1 VIEW COMPARISON:  Chest radiograph dated 05/19/2022. FINDINGS: There is cardiomegaly with vascular congestion and edema. Small right pleural effusion. Pneumonia is not excluded. No pneumothorax. Median sternotomy wires and mechanical cardiac valve. No acute osseous pathology. IMPRESSION: Cardiomegaly with findings of CHF and small right pleural effusion. Electronically Signed   By: Angus Bark M.D.   On: 10/12/2023 18:50   CT Head Wo Contrast Result Date: 10/12/2023 CLINICAL DATA:  Delirium leg swelling EXAM: CT HEAD WITHOUT CONTRAST TECHNIQUE: Contiguous axial images were obtained from the base of the skull through the vertex without intravenous contrast. RADIATION DOSE REDUCTION: This exam was performed according to the departmental dose-optimization program which includes automated exposure control, adjustment of the mA and/or kV according to patient size and/or use of iterative reconstruction technique. COMPARISON:  CT brain 09/25/2023 FINDINGS: Brain: No acute territorial infarction, hemorrhage or intracranial mass. Moderate white matter hypodensity consistent with chronic small vessel ischemic change. Atrophy. Stable ventricle size Vascular:  No hyperdense vessels.  Carotid vascular calcification. Skull: Normal. Negative for fracture or focal lesion. Sinuses/Orbits: Mild mucosal thickening in the sinuses Other: None IMPRESSION: 1. No CT evidence for acute intracranial abnormality. 2. Atrophy and chronic small vessel ischemic changes of the white matter. Electronically Signed   By: Esmeralda Hedge M.D.   On: 10/12/2023 18:41     Assessment and Plan:   Abnormal Echocardiogram  - Echocardiogram this admission showed a small 0.5 x 0.25 cm mobile echodensity in the left ventricular outflow tract. On serial comparison to study from 2021, this may have been present but was less visualized on previous echo. 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 Echodensity distant from the aortic valve prosthetic leaflets  - Considered TEE. However, patient is 40 and has dementia. He has never had CVA. He is confused and only oriented to self. Risks of TEE likely outweigh benefits as it may not change clinical decision.  Additionally, unlikely patient would be a candidate for surgery. Will discuss with daughter. The decision to proceed with TEE will be based on her discussion with Dr. Albert Huff   S/p AVR  Mild to moderate mitral regurgitation Mild to moderate mitral stenosis Mild/moderate tricuspid valve regurgitation - Noted on echocardiogram this admission.  Previously underwent AVR in 2012 with an Edwards pericardial valve - Echo this admission showed a 25 mm Edwards pericardial valve in the aortic position, echo findings consistent with normal structure and function of the aortic valve prosthesis - No indication for valvular intervention at this time  Acute on chronic diastolic heart failure -Compensated  -Presented with pitting lower extremity edema after missing doses of his home Lasix .  BNP mildly elevated to 176.  - He was treated with IV lasix , now on oral lasix  20 mg daily  - Euvolemic on exam. Continue PO lasix    Permanent atrial  fibrillation - HR well controlled  - Continue metoprolol  succinate 100 mg daily  - Continue Coumadin .  Appreciate pharmacy's assistance with dosing while admitted  CAD - Previously underwent CABG with LIMA-LAD in 2012 - Not on aspirin  due to Coumadin  use - Continue metoprolol  succinate 100 mg daily - With advanced  age and dementia, OK to not be on statin therapy to reduce pill burden   Risk Assessment/Risk Scores:   New York  Heart Association (NYHA) Functional Class NYHA Class II  CHA2DS2-VASc Score = 6  This indicates a 9.7% annual risk of stroke. The patient's score is based upon: CHF History: 1 HTN History: 1 Diabetes History: 1 Stroke History: 0 Vascular Disease History: 1 Age Score: 2 Gender Score: 0    For questions or updates, please contact [facility] Please consult [email or web address] for contact info under    Signed, Debria Fang, PA-C  10/14/2023 2:23 PM  ADDENDUM:   Patient seen and examined with Debria Fang, PA-C .  I personally taken a history, examined the patient, reviewed relevant notes,  laboratory data / imaging studies.  I performed a substantive portion of this encounter and formulated the important aspects of the plan.  I agree with the APP's note, impression, and recommendations; however, I have edited the note to reflect changes or salient points.   Chief complaint: Shortness of breath Reason of consult: Abnormal echo  83 year old African-American gentleman who is alert oriented x 1 presents to the hospital with a chief complaint of shortness of breath.  It is felt that his current exacerbation of CHF is secondary to not taking his diuretics regularly.  Primary team during his hospitalization has been managing his CHF and overall patient is euvolemic on physical examination.  During the hospitalization patient had an echocardiogram which notes preserved LVEF as well as valvular heart disease.  Incidentally he was noted to have  0.5 x 0.25 mobile echodensity in the LVOT findings to suggest possible papillary fibroelastoma.  The echocardiographer also mentions when comparing it to prior study from 2021 this finding was less evident previously.  Since patient is not the best historian I reached out to his daughter Matthew Edwards.  Who confirms patient has not had a stroke.  She endorses the patient does have underlying dementia and he is alert oriented x 1 at baseline.  PHYSICAL EXAM: Today's Vitals   10/14/23 1110 10/14/23 1551 10/14/23 1919 10/14/23 2000  BP: 126/69 (!) 137/97 (!) 145/82   Pulse: 74 96 77   Resp: 17 19 20    Temp: 97.8 F (36.6 C) 98 F (36.7 C) 97.9 F (36.6 C)   TempSrc: Oral Oral Oral   SpO2: 98% 95% 93%   Weight:      Height:      PainSc:    0-No pain   Body mass index is 23.64 kg/m.   Net IO Since Admission: 1,654 mL [10/14/23 2337]  Filed Weights   10/12/23 2226 10/13/23 1734 10/14/23 0500  Weight: 79.9 kg 71.7 kg 72.6 kg    General: Elderly male, frail, temporal wasting, sitting in bed watching TV HEENT: Moist mucous membranes, no JVP, trachea midline Lungs: Clear to auscultation bilaterally.  No wheezes rales or rhonchi's Heart: Irregularly irregular, variable S1-S2, soft holosystolic murmur heard at the apex, no gallops or rubs Abdomen: Soft, nontender, nondistended, positive bowel sounds in all 4 quadrants Extremities: No pitting edema warm to touch Neuro: Alert to himself and date of birth.  Does not know where he is, [place]  president, or any current events. Psych: Very pleasant, dementia  EKG: (personally reviewed by me) Oct 12, 2023: Atrial fibrillation, controlled ventricular rate, 80 bpm, PVCs  Telemetry: (personally reviewed by me) Atrial fibrillation with controlled ventricular rate.   Impression:  Acute on chronic HFpEF, stage C, NYHA class II  Abnormal echo Permanent atrial fibrillation. Chronic coumadin  Coronary artery disease without angina pectoris with  prior PCI and surgical vascularization. Aortic valve replacement Diabetes mellitus type 2. Dementia, at baseline  Recommendations:  Cardiology consulted during this hospitalization as he had an echocardiogram which noted echodensity in the LVOT tract findings suggestive of possible fibroblastoma.  Clinically patient has not experienced a stroke and based on the TTE measurements it is less than 1 cm.  When comparing to prior echocardiogram there seems to be a similar finding but  appears less evident.  Clinically patient is elderly, frail, has dementia and is alert oriented x 1.  I had a long discussion with the patient's daughter Matthew Edwards and we discussed the findings on the echocardiogram and that it suggested that it could be fibroelastoma based on appearance and location.  She is aware that fibroblastoma is a tissue diagnosis and often found incidentally on surface echocardiogram.  Ideal next step would be to do a transesophageal echocardiogram to further evaluate the finding on transthoracic echocardiogram.  However, given his comorbidities and baseline dementia patient is not an ideal candidate for surgical intervention especially in the setting of no TIA or strokes and based on transthoracic measurements is less than 1 cm.  After discussing the risks, benefits, and alternatives to transesophageal echocardiogram patient's daughter would like to proceed with conservative management.  This is quite appropriate in the clinical setting.  Matthew Edwards wanted me to speak to her other sister Deidre.  However she is not listed as one of the contact information as on Mr. Sain electronic medical records.  To avoid any HIPAA violations I have not disclosed findings to Ms. Deidre.   Recommend repeating an echocardiogram in 1 year to further surveillance.  Cardiology will sign off for now please reach out if any questions or concerns arise in the interim.  Further recommendations to follow as the case  evolves.   This note was created using a voice recognition software as a result there may be grammatical errors inadvertently enclosed that do not reflect the nature of this encounter. Every attempt is made to correct such errors.   Anes Rigel Albert Huff, DO, [facility]

## 2023-10-15 DIAGNOSIS — J9601 Acute respiratory failure with hypoxia: Secondary | ICD-10-CM | POA: Diagnosis not present

## 2023-10-15 DIAGNOSIS — I10 Essential (primary) hypertension: Secondary | ICD-10-CM | POA: Diagnosis not present

## 2023-10-15 DIAGNOSIS — I509 Heart failure, unspecified: Secondary | ICD-10-CM | POA: Diagnosis not present

## 2023-10-15 LAB — BASIC METABOLIC PANEL WITH GFR
Anion gap: 7 (ref 5–15)
BUN: 12 mg/dL (ref 8–23)
CO2: 32 mmol/L (ref 22–32)
Calcium: 9.6 mg/dL (ref 8.9–10.3)
Chloride: 100 mmol/L (ref 98–111)
Creatinine, Ser: 0.94 mg/dL (ref 0.61–1.24)
GFR, Estimated: >60 mL/min (ref >60)
Glucose, Bld: 86 mg/dL (ref 70–99)
Potassium: 3.8 mmol/L (ref 3.5–5.1)
Sodium: 139 mmol/L (ref 135–145)

## 2023-10-15 LAB — PROTIME-INR
INR: 2.6 — ABNORMAL HIGH (ref 0.8–1.2)
Prothrombin Time: 28.3 s — ABNORMAL HIGH (ref 11.4–15.2)

## 2023-10-15 LAB — MAGNESIUM: Magnesium: 1.9 mg/dL (ref 1.7–2.4)

## 2023-10-15 MED ORDER — MAGNESIUM SULFATE 2 GM/50ML IV SOLN
2.0000 g | Freq: Once | INTRAVENOUS | Status: AC
  Administered 2023-10-15: 2 g via INTRAVENOUS
  Filled 2023-10-15: qty 50

## 2023-10-15 MED ORDER — ENSURE ENLIVE PO LIQD
237.0000 mL | Freq: Two times a day (BID) | ORAL | Status: DC
  Administered 2023-10-16 – 2023-10-17 (×3): 237 mL via ORAL

## 2023-10-15 MED ORDER — WARFARIN SODIUM 5 MG PO TABS
5.0000 mg | ORAL_TABLET | Freq: Once | ORAL | Status: AC
  Administered 2023-10-15: 5 mg via ORAL
  Filled 2023-10-15: qty 1

## 2023-10-15 MED ORDER — POTASSIUM CHLORIDE CRYS ER 20 MEQ PO TBCR
20.0000 meq | EXTENDED_RELEASE_TABLET | Freq: Once | ORAL | Status: AC
  Administered 2023-10-15: 20 meq via ORAL
  Filled 2023-10-15: qty 1

## 2023-10-15 NOTE — Plan of Care (Signed)
 Patient has a poor appetite. Patient given ice cream and drink supplements today.

## 2023-10-15 NOTE — Assessment & Plan Note (Addendum)
 Dementia: Continue home donepezil  10 mg at bedtime; reorder home seroquel  50 mg QHS given concern for confusion Atrial fibrillation: Metoprolol  as above, on warfarin at home given aortic valve replacement; Warfarin per pharmacy dosing COPD: Continue Breo Ellipta  daily, O2 goal adjusted to 88-92%

## 2023-10-15 NOTE — Assessment & Plan Note (Signed)
 Euvolemic on exam.  Currently on 1L [place] appropriate O2 saturations. - Supplement O2 to goal O2 88-92%, otherwise RA - Continue home metoprolol  100 mg daily - Continue home Lasix  20 mg PO

## 2023-10-15 NOTE — Progress Notes (Addendum)
     Daily Progress Note Intern Pager: [phone]  Patient name: Matthew Edwards Medical record number: [id number] Date of birth: Oct 03, 1940 Age: 83 y.o. Gender: male  Primary Care Provider: Ulysees Gander, MD Consultants: Cardiology Code Status: Full code  Pt Overview and Major Events to Date:  5/13-admitted, diuresed with IV Lasix  20 mg 5/14-echo showed mobile papillary fibroelastoma and severe biatrial dilation 5/15-cardiology consulted.    Assessment and Plan: Matthew Edwards is an 83 year old male with PMH of CAD, COPD, HTN, OSA, A-fib, HFpEF, and aortic valve replacement presenting with dyspnea and admitted for CHF exacerbation.  Echo on admission showed fibroelastoma.  Medically stable for discharge Assessment & Plan Acute hypoxemic respiratory failure (HCC) Euvolemic on exam.  Currently on 1L [place] appropriate O2 saturations. 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 Chronic health problem Dementia: Continue home donepezil  10 mg at bedtime; reorder home seroquel  50 mg QHS given concern for confusion Atrial fibrillation: Metoprolol  as above, on warfarin at home given aortic valve replacement; Warfarin per pharmacy dosing. INR appropriate at 2.8 COPD: Continue Breo Ellipta  daily, O2 goal adjusted to 88-92%   FEN/GI: Carb modified diet PPx: Warfarin Dispo:SNF pending placement.   Subjective:  Patient laying down comfortably in bed. Denies any concerns or discomfort   Objective: Temp:  [97.6 F (36.4 C)-98.8 F (37.1 C)] 98.8 F (37.1 C) (05/16 1928) Pulse Rate:  [56-87] 64 (05/16 1928) Resp:  [14-22] 19 (05/16 1928) BP: (117-156)/(82-118) 139/93 (05/16 1928) SpO2:  [1 %-95 %] 94 % (05/16 1928) Weight:  [71 kg] 71 kg (05/16 0428) Physical Exam: General: Asleep but woke up to gentle  touch, NAD CV: Normal rate, Irregular rhythm, well perfused  Pulm: CTAB, good WOB on 1L [place]  Abd: Soft, no distension, no tenderness Ext: No BLE edema   Laboratory: Most recent CBC Lab Results  Component Value Date   WBC 6.5 10/13/2023   HGB 12.0 (L) 10/13/2023   HCT 40.7 10/13/2023   MCV 89.1 10/13/2023   PLT 149 (L) 10/13/2023   Most recent BMP    Latest Ref Rng & Units 10/15/2023    3:40 AM  BMP  Glucose 70 - 99 mg/dL 86   BUN 8 - 23 mg/dL 12   Creatinine 1.30 - 1.24 mg/dL 8.65   Sodium 784 - 696 mmol/L 139   Potassium 3.5 - 5.1 mmol/L 3.8   Chloride 98 - 111 mmol/L 100   CO2 22 - 32 mmol/L 32   Calcium  8.9 - 10.3 mg/dL 9.6    PTT- 29.5 INR- 2.8  Imaging/Diagnostic Tests: No new images  Goble Last, MD 10/15/2023, 9:45 PM  PGY-3, [facility] FPTS Intern pager: [phone], text pages welcome Secure chat group [facility] Teaching Service

## 2023-10-15 NOTE — Assessment & Plan Note (Addendum)
 Oxygen  at goal 88-92 on room air, fluid status euvolemic.  I/O last 24 neg 0.8 L.   - Supplement O2 to goal O2 88-92%, otherwise RA - Continue home metoprolol  100 mg daily - Continue home Lasix  20 mg PO

## 2023-10-15 NOTE — TOC Progression Note (Addendum)
 Transition of Care [facility]) - Progression Note    Patient Details  Name: Matthew Edwards MRN: [id number] Date of Birth: [birth year]  [facility] CM/SW Contact  Arron Big, [place] Phone Number: 10/15/2023, 8:37 AM  Clinical Narrative:   Robinette Chou chose [facility] for her father at this time.  Per financial counseling, Medicaid screening complete and at this time patient does not meet income level requirement.   11:04 AM Waiting on updated PT note for insurance auth.   1:39 PM CSW started ins auth at this time. Auth id [id number]. Now pending.   3:56 PM Auth still pending.   TOC will continue to follow.     Expected Discharge Plan: Skilled Nursing Facility Barriers to Discharge: Continued Medical Work up, English as a second language teacher  Expected Discharge Plan and Services       Living arrangements for the past 2 months: Apartment                                       Social Determinants of Health (SDOH) Interventions SDOH Screenings   Food Insecurity: No Food Insecurity (10/13/2023)  Housing: Low Risk  (10/13/2023)  Transportation Needs: No Transportation Needs (10/13/2023)  Utilities: Not At Risk (10/13/2023)  Depression (PHQ2-9): Medium Risk (11/19/2020)  Financial Resource Strain: Low Risk  (12/09/2022)   Received from [facility]  Social Connections: Unknown (10/13/2023)  Stress: No Stress Concern Present (03/22/2022)   Received from [facility], [facility]  Tobacco Use: Medium Risk (10/12/2023)    Readmission Risk Interventions     No data to display

## 2023-10-15 NOTE — Assessment & Plan Note (Signed)
 Cardiology recommends annual echo for surveillance.

## 2023-10-15 NOTE — Progress Notes (Signed)
 Daily Progress Note Intern Pager: [phone]  Patient name: Matthew Edwards Medical record number: [id number] Date of birth: 1940/11/05 Age: 83 y.o. Gender: male  Primary Care Provider: Ulysees Gander, MD Consultants: None Code Status: Full code  Pt Overview and Major Events to Date:  Matthew Edwards is a 83 y.o. 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  Did not reach daughter yesterday, plan to call again today.   5/13 - Admitted, diuresis with Lasix  20 mg IV 5/14 - Echo with concern for mobile papillary fibroelastoma, severe biatrial dilation 5/15 - Cardiology consulted, no inpatient recs. MEDICALLY STABLE.   Assessment and Plan:  Medically stable for discharge.  Assessment & Plan Acute hypoxemic respiratory failure (HCC) Oxygen  at goal 88-92 on room air, fluid status euvolemic.  I/O last 24 neg 0.8 L.   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  Cardiology consulted and recommends annual echo for surveillance. Chronic health problem Dementia: Continue home donepezil  10 mg at bedtime; reorder home seroquel  50 mg QHS given concern for confusion Atrial fibrillation: Metoprolol  as above, on warfarin at home given aortic valve replacement; Warfarin per pharmacy dosing COPD: Continue Breo Ellipta  daily, O2 goal adjusted to 88-92%   FEN/GI: Carb modified, KCl 61M EQ, mag sulfate 2 g, peripheral IV x 1 PPx: Warfarin Dispo:SNF pending placement.  Medically stable for discharge.   Subjective:  Patient reports no concerns today.  Reports that his daughter has not visited him in the last day.  Discussed that we are in the process of finding him a  skilled nursing facility.  He reports that his breathing is at baseline.  Objective: Temp:  [97.6 F (36.4 C)-98.4 F (36.9 C)] 98 F (36.7 C) (05/16 1121) Pulse Rate:  [62-96] 80 (05/16 1121) Resp:  [14-22] 16 (05/16 1121) BP: (126-156)/(82-118) 126/85 (05/16 1121) SpO2:  [90 %-95 %] 91 % (05/16 1000) Weight:  [71 kg] 71 kg (05/16 0428) Physical Exam: General: Alert, calm, cooperative, hard of hearing, not in acute distress. Cardiovascular: Normal rate, irregular rhythm.  Normal heart sounds. Respiratory: Normal effort.  Satting around 90% on room air.  Rhonchi throughout lung fields but no crackles or wheezes. Abdomen: Soft and nontender. Extremities: No lower extremity edema bilaterally.  Laboratory: Most recent CBC Lab Results  Component Value Date   WBC 6.5 10/13/2023   HGB 12.0 (L) 10/13/2023   HCT 40.7 10/13/2023   MCV 89.1 10/13/2023   PLT 149 (L) 10/13/2023   Most recent BMP    Latest Ref Rng & Units 10/15/2023    3:40 AM  BMP  Glucose 70 - 99 mg/dL 86   BUN 8 - 23 mg/dL 12   Creatinine 1.30 - 1.24 mg/dL 8.65   Sodium 784 - 696 mmol/L 139   Potassium 3.5 - 5.1 mmol/L 3.8   Chloride 98 - 111 mmol/L 100   CO2 22 - 32 mmol/L 32   Calcium  8.9 - 10.3 mg/dL 9.6     Mag: 1.9   Verdell Given, MD 10/15/2023, 12:56 PM  PGY-1, [facility] FPTS Intern pager: [phone], text pages welcome Secure chat group [facility]  Teaching Service

## 2023-10-15 NOTE — Assessment & Plan Note (Signed)
 Seen on echo.  Cardiology consulted and recommends annual echo for surveillance.

## 2023-10-15 NOTE — Assessment & Plan Note (Addendum)
 Dementia: Continue home donepezil  10 mg at bedtime; reorder home seroquel  50 mg QHS given concern for confusion Atrial fibrillation: Metoprolol  as above, on warfarin at home given aortic valve replacement; Warfarin per pharmacy dosing. INR appropriate at 2.8 COPD: Continue Breo Ellipta  daily, O2 goal adjusted to 88-92%

## 2023-10-15 NOTE — Progress Notes (Signed)
 PHARMACY - ANTICOAGULATION CONSULT NOTE  Pharmacy Consult for Warfarin Indication: AFIB, bioprosthetic AVR   Allergies  Allergen Reactions   Pineapple Concentrate Nausea And Vomiting   Patient Measurements: Height: 5\' 9"  (175.3 cm) Weight: 71 kg (156 lb 8.4 oz) IBW/kg (Calculated) : 70.7 HEPARIN DW (KG): 71.7  Vital Signs: Temp: 98 F (36.7 C) (05/16 1121) Temp Source: Oral (05/16 1121) BP: 126/85 (05/16 1121) Pulse Rate: 80 (05/16 1121)  Labs: Recent Labs    10/12/23 1755 10/12/23 2125 10/12/23 2125 10/13/23 0530 10/14/23 0306 10/15/23 0340  HGB 12.3*  --   --  12.0*  --   --   HCT 40.7  --   --  40.7  --   --   PLT 156  --   --  149*  --   --   LABPROT  --  31.1*   < > 31.0* 28.7* 28.3*  INR  --  3.0*   < > 3.0* 2.7* 2.6*  CREATININE 0.84  --   --  0.71 1.01 0.94  TROPONINIHS 10 9  --   --   --   --    < > = values in this interval not displayed.    Estimated Creatinine Clearance: 62.7 mL/min (by C-G formula based on SCr of 0.94 mg/dL).  Medications:  Scheduled:   donepezil   10 mg Oral QHS   feeding supplement  237 mL Oral BID BM   fluticasone  furoate-vilanterol  1 puff Inhalation Daily   furosemide   20 mg Oral Daily   metoprolol  succinate  100 mg Oral Daily   Warfarin - Pharmacist Dosing Inpatient   Does not apply q1600   Assessment: 83 YO M presented 5/13 via EMS with dyspnea and concern for CHF exacerbation. PTA warfarin 5 mg every day (35 mg weekly) for AFIB and bioprosthetic AVR. Pharmacy consulted for warfarin management.   5/16: INR 2.6 (therapeutic)   Goal of Therapy:  INR 2-3 Monitor platelets by anticoagulation protocol: Yes   Plan:  Warfarin 5 mg x 1 today F/U daily INR, new drug-drug interactions, and warfarin dosing   Norberta Stobaugh, Pharm.D., BCPS Clinical Pharmacist  **Pharmacist phone directory can be found on [email or web address] listed under [facility].  10/15/2023 2:21 PM

## 2023-10-15 NOTE — Progress Notes (Signed)
 Physical Therapy Treatment Patient Details Name: Matthew Edwards MRN: [id number] DOB: [birth year] Today's Date: 10/15/2023   History of Present Illness Pt is an 83 y/o M presenting to ED On 5/13 with BLE edema, incr drowsiness, had not been taking Lasix  at home. Admitted for CHF exacerbation w/ small R pleural effusion. Recent fall, CT head negative. PMH includes CAD s/p CABG and stenting, A fib, chronic bowel incontinence, AVR, HTN, dementia    PT Comments  Pt progression towards goals limited by cognitive status. Pt had significant difficulty attending to tasks this session requiring frequent reorientation and goal driven verbal cues. Pt ambulated with RW and minimal physical assistance for several losses of balance. Pt would benefit from further transfer and gait training. PT will continue to follow while patient is admitted. Patient will benefit from continued inpatient follow up therapy, <3 hours/day.     If plan is discharge home, recommend the following: A little help with walking and/or transfers;A little help with bathing/dressing/bathroom;Assist for transportation;Help with stairs or ramp for entrance;Direct supervision/assist for financial management;Direct supervision/assist for medications management;Assistance with cooking/housework   Can travel by private vehicle     Yes  Equipment Recommendations  None recommended by PT    Recommendations for Other Services       Precautions / Restrictions Precautions Precautions: Fall Recall of Precautions/Restrictions: Intact Precaution/Restrictions Comments: watch O2 Restrictions Weight Bearing Restrictions Per Provider Order: No     Mobility  Bed Mobility Overal bed mobility: Needs Assistance Bed Mobility: Supine to Sit, Sit to Supine     Supine to sit: Mod assist Sit to supine: Mod assist   General bed mobility comments: Pt requires mod A for trunk management for supine <> sit with HOB elevated, hand on hand assist for  reaching for L bed rail. Frequent VC and reorientation to task required.    Transfers Overall transfer level: Needs assistance Equipment used: Rolling walker (2 wheels) Transfers: Sit to/from Stand Sit to Stand: Mod assist, +2 safety/equipment           General transfer comment: Pt completed multiple sit to stands from EOB requiring varying levels of assistance. Pt was uanble to maintain standing position for first two trials, sitting back down onto bed with an uncontrolled descent. Last sit to stand attempt patient was min A. Requires frequent VC.    Ambulation/Gait Ambulation/Gait assistance: Min assist Gait Distance (Feet): 50 Feet Assistive device: Rolling walker (2 wheels) Gait Pattern/deviations: Knee flexed in stance - right, Knee flexed in stance - left, Step-through pattern, Shuffle, Narrow base of support, Drifts right/left Gait velocity: decreased Gait velocity interpretation: <1.8 ft/sec, indicate of risk for recurrent falls   General Gait Details: Pt ambulates with RW and CGA to occasional min A for lateral losses of balance. Pt requries frequent VC for walker management and sequencing. Pt had several instances of unprompted attempts to sit down while ambulating past nearby chairs due to decreased attention to task.   Stairs             Wheelchair Mobility     Tilt Bed    Modified Rankin (Stroke Patients Only)       Balance Overall balance assessment: Needs assistance Sitting-balance support: Feet supported, Bilateral upper extremity supported Sitting balance-Leahy Scale: Poor Sitting balance - Comments: Pt had several instances of posterior trunk lean while sitting EOB requiring frequent verbal cueing, visual cue of therapist's badge, and goal oriented cues for him to return to upright position. Pt is capable of  sitting upright without UE support but has increased difficulty due to being unable to attend to task for prolonged periods. Postural control:  Posterior lean Standing balance support: During functional activity, Reliant on assistive device for balance Standing balance-Leahy Scale: Poor                              Communication Communication Communication: Impaired Factors Affecting Communication: Hearing impaired  Cognition Arousal: Alert Behavior During Therapy: Flat affect   PT - Cognitive impairments: No family/caregiver present to determine baseline, Problem solving, Safety/Judgement, History of cognitive impairments                         Following commands: Impaired Following commands impaired: Follows one step commands inconsistently    Cueing Cueing Techniques: Verbal cues, Tactile cues, Gestural cues  Exercises      General Comments General comments (skin integrity, edema, etc.): no signs of acute distress. Sp02 at end of session in 90s; RN aware.      Pertinent Vitals/Pain Pain Assessment Pain Assessment: No/denies pain Breathing: normal Negative Vocalization: none Facial Expression: smiling or inexpressive Body Language: relaxed Consolability: no need to console PAINAD Score: 0    Home Living                          Prior Function            PT Goals (current goals can now be found in the care plan section) Acute Rehab PT Goals Patient Stated Goal: to go home PT Goal Formulation: With patient Time For Goal Achievement: 10/27/23 Potential to Achieve Goals: Fair Progress towards PT goals: Not progressing toward goals - comment (pt had difficulty attending to tasks this session and required greater assistance for mobilization than in previous session)    Frequency    Min 2X/week      PT Plan      Co-evaluation              AM-PAC PT "6 Clicks" Mobility   Outcome Measure  Help needed turning from your back to your side while in a flat bed without using bedrails?: A Lot Help needed moving from lying on your back to sitting on the side of a flat  bed without using bedrails?: A Lot Help needed moving to and from a bed to a chair (including a wheelchair)?: A Lot Help needed standing up from a chair using your arms (e.g., wheelchair or bedside chair)?: A Little Help needed to walk in hospital room?: A Little Help needed climbing 3-5 steps with a railing? : A Lot 6 Click Score: 14    End of Session Equipment Utilized During Treatment: Gait belt Activity Tolerance: Other (comment) (mobility progressions limited secondary to cognition) Patient left: in bed;with call bell/phone within reach;with bed alarm set Nurse Communication: Mobility status PT Visit Diagnosis: Unsteadiness on feet (R26.81);History of falling (Z91.81);Muscle weakness (generalized) (M62.81)     Time: 1610-9604 PT Time Calculation (min) (ACUTE ONLY): 28 min  Charges:    $Therapeutic Activity: 23-37 mins PT General Charges $$ ACUTE PT VISIT: 1 Visit                     10/15/2023     Lonell Rives, SPT Acute Rehab [phone]   Lonell Rives 10/15/2023, 1:13 PM

## 2023-10-16 DIAGNOSIS — I509 Heart failure, unspecified: Secondary | ICD-10-CM | POA: Diagnosis not present

## 2023-10-16 DIAGNOSIS — J9601 Acute respiratory failure with hypoxia: Secondary | ICD-10-CM | POA: Diagnosis not present

## 2023-10-16 LAB — PROTIME-INR
INR: 2.8 — ABNORMAL HIGH (ref 0.8–1.2)
Prothrombin Time: 29.9 s — ABNORMAL HIGH (ref 11.4–15.2)

## 2023-10-16 MED ORDER — WARFARIN SODIUM 5 MG PO TABS
5.0000 mg | ORAL_TABLET | Freq: Once | ORAL | Status: AC
  Administered 2023-10-16: 5 mg via ORAL
  Filled 2023-10-16: qty 1

## 2023-10-16 NOTE — Progress Notes (Signed)
 PHARMACY - ANTICOAGULATION CONSULT NOTE  Pharmacy Consult for Warfarin Indication: AFIB, bioprosthetic AVR   Allergies  Allergen Reactions   Pineapple Concentrate Nausea And Vomiting   Patient Measurements: Height: 5\' 9"  (175.3 cm) Weight: 69.1 kg (152 lb 5.4 oz) IBW/kg (Calculated) : 70.7 HEPARIN DW (KG): 71.7  Vital Signs: Temp: 97.7 F (36.5 C) (05/17 0739) Temp Source: Oral (05/17 0739) BP: 135/69 (05/17 0739) Pulse Rate: 76 (05/17 0739)  Labs: Recent Labs    10/14/23 0306 10/15/23 0340 10/16/23 0238  LABPROT 28.7* 28.3* 29.9*  INR 2.7* 2.6* 2.8*  CREATININE 1.01 0.94  --     Estimated Creatinine Clearance: 61.3 mL/min (by C-G formula based on SCr of 0.94 mg/dL).  Medications:  Scheduled:   donepezil   10 mg Oral QHS   feeding supplement  237 mL Oral BID BM   fluticasone  furoate-vilanterol  1 puff Inhalation Daily   furosemide   20 mg Oral Daily   metoprolol  succinate  100 mg Oral Daily   Warfarin - Pharmacist Dosing Inpatient   Does not apply q1600   Assessment: 83 YO M presented 5/13 via EMS with dyspnea and concern for CHF exacerbation. PTA warfarin 5 mg every day (35 mg weekly) for AFIB and bioprosthetic AVR. Pharmacy consulted for warfarin management.   5/16: INR 2.6 (therapeutic) 5/17: INR 2.8 (therapeutic) - nutritional supplement started. Anticipate future increased warfarin dosing requirements with this addition for decreased appetite, will continue to monitor. No signs/symptoms of bleeding noted.  Goal of Therapy:  INR 2-3 Monitor platelets by anticoagulation protocol: Yes   Plan:  Warfarin 5 mg x 1 today Daily INR Monitor for signs/symptoms of bleeding Monitor appetite and for new drug-drug interactions   Jerrel Mor, PharmD PGY1 Pharmacy Resident 10/16/2023 9:36 AM

## 2023-10-16 NOTE — TOC Progression Note (Signed)
 Transition of Care [facility]) - Initial/Assessment Note    Patient Details  Name: Matthew Edwards MRN: [id number] Date of Birth: 03-01-1941  Transition [facility]) CM/SW Contact:    Maya Sparrow, LCSW Phone Number: 10/16/2023, 2:37 PM  Clinical Narrative:                 Insurance authorization approved with start date of 10/18/2023.  Next review date 10/20/2023.  Auth ID- [id number].  TOC will continue to follow.    Expected Discharge Plan: Skilled Nursing Facility Barriers to Discharge: Continued Medical Work up, English as a second language teacher   Patient Goals and CMS Choice            Expected Discharge Plan and Services       Living arrangements for the past 2 months: Apartment                                      Prior Living Arrangements/Services Living arrangements for the past 2 months: Apartment Lives with:: Self, Adult Children Patient language and need for interpreter reviewed:: Yes        Need for Family Participation in Patient Care: Yes (Comment) Care giver support system in place?: Yes (comment)   Criminal Activity/Legal Involvement Pertinent to Current Situation/Hospitalization: No - Comment as needed  Activities of Daily Living   ADL Screening (condition at time of admission) Independently performs ADLs?: No Does the patient have a NEW difficulty with bathing/dressing/toileting/self-feeding that is expected to last >3 days?: Yes (Initiates electronic notice to provider for possible OT consult) Does the patient have a NEW difficulty with getting in/out of bed, walking, or climbing stairs that is expected to last >3 days?: Yes (Initiates electronic notice to provider for possible PT consult) Does the patient have a NEW difficulty with communication that is expected to last >3 days?: No Is the patient deaf or have difficulty hearing?: Yes Does the patient have difficulty seeing, even when wearing glasses/contacts?: No Does the patient have difficulty  concentrating, remembering, or making decisions?: Yes  Permission Sought/Granted                  Emotional Assessment Appearance:: Appears stated age     Orientation: : Oriented to Self, Oriented to Place Alcohol / Substance Use: Not Applicable Psych Involvement: No (comment)  Admission diagnosis:  CHF (congestive heart failure) (HCC) [I50.9] Essential hypertension [I10] Respiratory failure with hypoxia, unspecified chronicity (HCC) [J96.91] Acute congestive heart failure, unspecified heart failure type (HCC) [I50.9] Patient Active Problem List   Diagnosis Date Noted   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 10/14/2023   Acute on chronic heart failure with preserved ejection fraction (HCC) 10/14/2023   Abnormal echocardiogram 10/14/2023   History of coronary artery stent placement 10/14/2023   Dementia without behavioral disturbance, psychotic disturbance, mood disturbance, or anxiety (HCC) 10/14/2023   Diabetes mellitus type 2, noninsulin dependent (HCC) 10/14/2023   Anticoagulated on Coumadin  10/14/2023   Chronic health problem 10/12/2023   Acute hypoxemic respiratory failure (HCC) 10/12/2023   Dysuria 08/13/2020   Lower extremity weakness 03/18/2020   Incontinence, feces 03/12/2020   Cerumen impaction 11/23/2019   Weight loss 11/21/2019   Adenomatous rectal polyp with high grade dysplasia s/p TEM resection 05/26/2016 05/26/2016   Spinal stenosis of lumbar region 11/28/2015   OSA (obstructive sleep apnea) 11/13/2014   Pulmonary HTN (HCC) 08/24/2014   Acute congestive heart failure (HCC)  08/02/2014   Cognitive impairment 07/02/2014   S/P AVR 12/29/2012   Atrial fibrillation (HCC)    Type 2 diabetes mellitus with diabetic neuropathy, unspecified (HCC) 11/25/2011   Vitamin D  deficiency 02/21/2010   Hypercalcemia 05/29/2009   Essential hypertension 02/23/2007   Coronary atherosclerosis 12/15/2006   GERD 12/15/2006   COPD (chronic obstructive pulmonary  disease) (HCC) 12/10/2006   HLD (hyperlipidemia) 07/29/2006   Alcohol abuse 07/29/2006   Aortic valve disorder 07/29/2006   PCP:  Ulysees Gander, MD Pharmacy:   [facility] - [place], [place] - [address] [place] [address] Phone: [phone] Fax: [phone]  [facility] - [place]), [place] - [address] [place] [address] Phone: [phone] Fax: [phone]  [id number] - , [place] - [address] AT [facility] [address]  [place] [address] Phone: [phone] Fax: [phone]  [facility] - [place], [place] - [address] [place] [address] Phone: [phone] Fax: [phone]  [facility]) - [place], [place] - [address] [facility] [address] [place] [address] Phone: [phone] Fax: [phone]  [facility] [address] [place] - [address] [place] [address] Phone: [phone] Fax: [phone]  [facility] - [place], [place] - [address] [place]  [address] Phone: [phone] Fax: [phone]  [id number] [place], [place] - [address] AT [facility] [address] [place] [address] Phone: [phone] Fax: [phone]  [facility] [address] [place] [address] Phone: [phone] Fax: [phone]     Social Drivers of Health (SDOH) Social History: SDOH Screenings   Food Insecurity: No Food Insecurity (10/13/2023)  Housing: Low Risk  (10/13/2023)  Transportation Needs: No Transportation Needs (10/13/2023)  Utilities: Not At Risk (10/13/2023)  Depression (PHQ2-9): Medium Risk (11/19/2020)  Financial  Resource Strain: Low Risk  (12/09/2022)   Received from [facility]  Social Connections: Unknown (10/13/2023)  Stress: No Stress Concern Present (03/22/2022)   Received from [facility]  Tobacco Use: Medium Risk (10/12/2023)   SDOH Interventions:     Readmission Risk Interventions     No data to display

## 2023-10-16 NOTE — Plan of Care (Signed)
 Patient continues to have poor appetite. Can not wean 02 to off. 02 sats decrease to 87-88 on room air.

## 2023-10-17 DIAGNOSIS — J9691 Respiratory failure, unspecified with hypoxia: Secondary | ICD-10-CM | POA: Diagnosis not present

## 2023-10-17 DIAGNOSIS — J9601 Acute respiratory failure with hypoxia: Secondary | ICD-10-CM | POA: Diagnosis not present

## 2023-10-17 DIAGNOSIS — I10 Essential (primary) hypertension: Secondary | ICD-10-CM | POA: Diagnosis not present

## 2023-10-17 LAB — CULTURE, BLOOD (ROUTINE X 2)
Culture: NO GROWTH
Culture: NO GROWTH
Special Requests: ADEQUATE

## 2023-10-17 LAB — BASIC METABOLIC PANEL WITH GFR
Anion gap: 9 (ref 5–15)
BUN: 24 mg/dL — ABNORMAL HIGH (ref 8–23)
CO2: 31 mmol/L (ref 22–32)
Calcium: 9.7 mg/dL (ref 8.9–10.3)
Chloride: 94 mmol/L — ABNORMAL LOW (ref 98–111)
Creatinine, Ser: 1.23 mg/dL (ref 0.61–1.24)
GFR, Estimated: 59 mL/min — ABNORMAL LOW (ref >60)
Glucose, Bld: 153 mg/dL — ABNORMAL HIGH (ref 70–99)
Potassium: 3.8 mmol/L (ref 3.5–5.1)
Sodium: 134 mmol/L — ABNORMAL LOW (ref 135–145)

## 2023-10-17 LAB — PROTIME-INR
INR: 2.9 — ABNORMAL HIGH (ref 0.8–1.2)
Prothrombin Time: 31 s — ABNORMAL HIGH (ref 11.4–15.2)

## 2023-10-17 MED ORDER — METOPROLOL SUCCINATE ER 50 MG PO TB24
50.0000 mg | ORAL_TABLET | Freq: Every day | ORAL | Status: DC
  Administered 2023-10-17 – 2023-10-18 (×2): 50 mg via ORAL
  Filled 2023-10-17 (×2): qty 1

## 2023-10-17 MED ORDER — METOPROLOL SUCCINATE ER 50 MG PO TB24: 50.0000 mg | ORAL_TABLET | Freq: Every day | ORAL | Status: DC

## 2023-10-17 MED ORDER — WARFARIN SODIUM 5 MG PO TABS
5.0000 mg | ORAL_TABLET | Freq: Once | ORAL | Status: AC
  Administered 2023-10-17: 5 mg via ORAL
  Filled 2023-10-17: qty 1

## 2023-10-17 NOTE — Assessment & Plan Note (Signed)
 Dementia: Continue home donepezil  10 mg at bedtime Atrial fibrillation: Continue home metoprolol  as well as warfarin given aortic valve replacement COPD: Continue Breo Ellipta  daily, O2 goal adjusted to 88-92%

## 2023-10-17 NOTE — Plan of Care (Signed)
  Problem: Education: Goal: Knowledge of General Education information will improve Description: Including pain rating scale, medication(s)/side effects and non-pharmacologic comfort measures Outcome: Not Progressing Variance Physical/mental limitations Impact: Moderate   Problem: Pain Managment: Goal: General experience of comfort will improve and/or be controlled Outcome: Progressing   Problem: Safety: Goal: Ability to remain free from injury will improve Outcome: Progressing   Problem: Skin Integrity: Goal: Risk for impaired skin integrity will decrease Outcome: Progressing

## 2023-10-17 NOTE — Assessment & Plan Note (Signed)
 Cardiology recommends annual echo for surveillance.

## 2023-10-17 NOTE — Progress Notes (Signed)
     Daily Progress Note Intern Pager: [phone]  Patient name: Matthew Edwards Medical record number: [id number] Date of birth: 1941/04/07 Age: 83 y.o. Gender: male  Primary Care Provider: Ulysees Gander, MD Consultants: Cardiology Code Status: Full  Pt Overview and Major Events to Date:  5/13-admitted and diuresed with IV Lasix  20 mg 5/14-echo with mobile papillary fibroelastoma and severe biatrial dilation with LVEF 60 to 65% 5/15-cardiology consulted, recommend annual echo for surveillance  Assessment and Plan:  Matthew Edwards is an 83 year old male with PMH of CAD, COPD, HTN, OSA, A-fib, HFpEF, and aortic valve replacement presenting with dyspnea and admitted for CHF exacerbation.  Echo on admission showed fibroelastoma and normal LVEF with indeterminate diastolic parameters.  Medically stable for discharge. Assessment & Plan Acute hypoxemic respiratory failure (HCC) In setting of CHF exacerbation.  Now euvolemic on exam.  Currently on 1-2L [place] with appropriate O2 saturations. 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 Chronic health problem Dementia: Continue home donepezil  10 mg at bedtime Atrial fibrillation: Continue home metoprolol  as well as warfarin given aortic valve replacement COPD: Continue Breo Ellipta  daily, O2 goal adjusted to 88-92%  FEN/GI: Carb modified PPx: Warfarin Dispo: SNF pending placement  Subjective:  No medical concerns this morning.  Doing well.  Objective: Temp:  [97.7 F (36.5 C)-99.2 F (37.3 C)] 98.9 F (37.2 C) (05/18 0424) Pulse Rate:  [40-76] 52 (05/18 0424) Resp:  [15-23] 23 (05/18 0424) BP: (118-142)/(68-97) 126/75 (05/18 0424) SpO2:  [80 %-95 %] 93 % (05/18 0424) Weight:  [68.9 kg] 68.9 kg (05/18 0424) Physical Exam: General: Lying in bed,  sleeping, no acute distress Cardiovascular: Bradycardic, regular rhythm without murmurs rubs or gallops, no edema of the extremities Respiratory: Clear to auscultation bilaterally anteriorly without wheezes rales or rhonchi Abdomen: Soft, nontender  Laboratory: Most recent CBC Lab Results  Component Value Date   WBC 6.5 10/13/2023   HGB 12.0 (L) 10/13/2023   HCT 40.7 10/13/2023   MCV 89.1 10/13/2023   PLT 149 (L) 10/13/2023   Most recent BMP    Latest Ref Rng & Units 10/15/2023    3:40 AM  BMP  Glucose 70 - 99 mg/dL 86   BUN 8 - 23 mg/dL 12   Creatinine 6.96 - 1.24 mg/dL 2.95   Sodium 284 - 132 mmol/L 139   Potassium 3.5 - 5.1 mmol/L 3.8   Chloride 98 - 111 mmol/L 100   CO2 22 - 32 mmol/L 32   Calcium  8.9 - 10.3 mg/dL 9.6    PT 31 INR 2.9  Dema Filler, MD 10/17/2023, 5:42 AM  PGY-2, [facility] FPTS Intern pager: [phone], text pages welcome Secure chat group [facility] Teaching Service

## 2023-10-17 NOTE — Plan of Care (Signed)

## 2023-10-17 NOTE — Assessment & Plan Note (Signed)
 In setting of CHF exacerbation.  Now euvolemic on exam.  Currently on 1-2L [place] with appropriate O2 saturations. - Supplement O2 to goal O2 88-92%, otherwise RA - Continue home metoprolol  100 mg daily - Continue home Lasix  20 mg PO

## 2023-10-18 DIAGNOSIS — J9691 Respiratory failure, unspecified with hypoxia: Secondary | ICD-10-CM | POA: Diagnosis not present

## 2023-10-18 DIAGNOSIS — J9601 Acute respiratory failure with hypoxia: Secondary | ICD-10-CM | POA: Diagnosis not present

## 2023-10-18 DIAGNOSIS — I509 Heart failure, unspecified: Secondary | ICD-10-CM | POA: Diagnosis not present

## 2023-10-18 DIAGNOSIS — I10 Essential (primary) hypertension: Secondary | ICD-10-CM | POA: Diagnosis not present

## 2023-10-18 MED ORDER — METOPROLOL SUCCINATE ER 50 MG PO TB24: 50.0000 mg | ORAL_TABLET | Freq: Every day | ORAL | Status: DC

## 2023-10-18 MED ORDER — ENSURE ENLIVE PO LIQD: 237.0000 mL | Freq: Two times a day (BID) | ORAL | Status: DC

## 2023-10-18 NOTE — TOC Transition Note (Signed)
 Transition of Care [facility]) - Discharge Note   Patient Details  Name: Matthew Edwards MRN: [id number] Date of Birth: 1940-09-24  Transition of Care [facility]) CM/SW Contact:  Arron Big, LCSWA Phone Number: 10/18/2023, 12:09 PM   Clinical Narrative:   Patient will DC to:  [facility] Anticipated DC date: 10/18/23 Family notified: [name] (dtr)  Transport by: Lyna Sandhoff   Per MD patient ready for DC to [facility]. RN to call report prior to discharge [phone]; room 901A ). RN, patient, patient's family, and facility notified of DC. Discharge Summary and FL2 sent to facility. DC packet on chart. Ambulance transport requested for patient at 12:10PM.   CSW will sign off for now as social work intervention is no longer needed. Please consult us  again if new needs arise.      Final next level of care: Skilled Nursing Facility Barriers to Discharge: Barriers Resolved   Patient Goals and CMS Choice            Discharge Placement              Patient [facility] bed at: [facility] Patient to be transferred to facility by: [facility] Name of family member notified: [name] (dtr) (left VM) Patient and family notified of of transfer: 10/18/23  Discharge Plan and Services Additional resources added to the After Visit Summary for                                       Social Drivers of Health (SDOH) Interventions SDOH Screenings   Food Insecurity: No Food Insecurity (10/13/2023)  Housing: Low Risk  (10/13/2023)  Transportation Needs: No Transportation Needs (10/13/2023)  Utilities: Not At Risk (10/13/2023)  Depression (PHQ2-9): Medium Risk (11/19/2020)  Financial Resource Strain: Low Risk  (12/09/2022)   Received from [facility]  Social Connections: Unknown (10/13/2023)  Stress: No Stress Concern Present (03/22/2022)   [facility] from [facility]  Tobacco Use: Medium Risk (10/12/2023)     Readmission Risk Interventions     No data  to display

## 2023-10-18 NOTE — Progress Notes (Signed)
 Physical Therapy Treatment Patient Details Name: Matthew Edwards MRN: [id number] DOB: Oct 30, 1940 Today's Date: 10/18/2023   History of Present Illness Pt is an 83 y/o M presenting to ED On 5/13 with BLE edema, incr drowsiness, had not been taking Lasix  at home. Admitted for CHF exacerbation w/ small R pleural effusion. Recent fall, CT head negative. PMH includes CAD s/p CABG and stenting, A fib, chronic bowel incontinence, AVR, HTN, dementia    PT Comments  Patient received in bed, he is HOH. Agrees to PT session. Limited by lethargy. Patient requires mod A to sit up on side of bed. Requests to lie back down upon sitting up. He is able to stand from bed with mod +2 A. Patient requires assistance to manage RW and was able to take a few steps from bed to recliner. He will continue to benefit from skilled PT to improve strength, endurance and safety with mobility.       If plan is discharge home, recommend the following: A little help with walking and/or transfers;A little help with bathing/dressing/bathroom;Assist for transportation;Help with stairs or ramp for entrance;Direct supervision/assist for financial management;Direct supervision/assist for medications management;Assistance with cooking/housework   Can travel by private vehicle     No  Equipment Recommendations  None recommended by PT    Recommendations for Other Services       Precautions / Restrictions Precautions Precautions: Fall Recall of Precautions/Restrictions: Impaired Precaution/Restrictions Comments: watch O2 Restrictions Weight Bearing Restrictions Per Provider Order: No     Mobility  Bed Mobility Overal bed mobility: Needs Assistance Bed Mobility: Supine to Sit     Supine to sit: Mod assist     General bed mobility comments: mod assist for trunk elevation and cues to get scooted around to edge of bed with feet on floor. Requesting to lie back down upon sitting up    Transfers Overall transfer level:  Needs assistance Equipment used: Rolling walker (2 wheels) Transfers: Sit to/from Stand Sit to Stand: Mod assist, +2 physical assistance           General transfer comment: Increased time and effort to get standing from edge of bed. Min/Mod A +2 needed.    Ambulation/Gait Ambulation/Gait assistance: Min assist, +2 physical assistance, +2 safety/equipment Gait Distance (Feet): 3 Feet Assistive device: Rolling walker (2 wheels)   Gait velocity: decr     General Gait Details: Patient requires min A +2 for mobility. Assist needed to manage RW. Fatigued with minimal activity.   Stairs             Wheelchair Mobility     Tilt Bed    Modified Rankin (Stroke Patients Only)       Balance Overall balance assessment: Needs assistance Sitting-balance support: Feet supported Sitting balance-Leahy Scale: Fair     Standing balance support: Bilateral upper extremity supported, During functional activity, Reliant on assistive device for balance Standing balance-Leahy Scale: Poor Standing balance comment: RW for ambulation and external assist.                            Communication Communication Communication: Impaired Factors Affecting Communication: Hearing impaired  Cognition Arousal: Alert Behavior During Therapy: Flat affect   PT - Cognitive impairments: No family/caregiver present to determine baseline, Problem solving, Safety/Judgement, History of cognitive impairments, Awareness                       PT - Cognition  Comments: Pt had difficulty attending to tasks requiring frequent reorientation and simple single-step functional goal oriented commands. Following commands: Impaired Following commands impaired: Follows one step commands inconsistently, Follows one step commands with increased time    Cueing Cueing Techniques: Verbal cues, Gestural cues, Tactile cues  Exercises      General Comments        Pertinent Vitals/Pain Pain  Assessment Breathing: normal Negative Vocalization: none Facial Expression: smiling or inexpressive Body Language: relaxed Consolability: no need to console PAINAD Score: 0    Home Living                          Prior Function            PT Goals (current goals can now be found in the care plan section) Acute Rehab PT Goals Patient Stated Goal: to go home PT Goal Formulation: With patient Time For Goal Achievement: 10/27/23 Potential to Achieve Goals: Fair Progress towards PT goals: Progressing toward goals    Frequency    Min 2X/week      PT Plan      Co-evaluation              AM-PAC PT "6 Clicks" Mobility   Outcome Measure  Help needed turning from your back to your side while in a flat bed without using bedrails?: A Lot Help needed moving from lying on your back to sitting on the side of a flat bed without using bedrails?: A Lot Help needed moving to and from a bed to a chair (including a wheelchair)?: A Lot Help needed standing up from a chair using your arms (e.g., wheelchair or bedside chair)?: A Lot Help needed to walk in hospital room?: A Lot Help needed climbing 3-5 steps with a railing? : Total 6 Click Score: 11    End of Session Equipment Utilized During Treatment: Gait belt;Oxygen  Activity Tolerance: Patient limited by fatigue Patient left: in chair;with call bell/phone within reach;with chair alarm set Nurse Communication: Mobility status PT Visit Diagnosis: Unsteadiness on feet (R26.81);History of falling (Z91.81);Muscle weakness (generalized) (M62.81);Other abnormalities of gait and mobility (R26.89);Difficulty in walking, not elsewhere classified (R26.2)     Time: 9147-8295 PT Time Calculation (min) (ACUTE ONLY): 12 min  Charges:    $Therapeutic Activity: 8-22 mins PT General Charges $$ ACUTE PT VISIT: 1 Visit                     Laira Penninger, PT, GCS 10/18/23,11:58 AM

## 2023-10-18 NOTE — Progress Notes (Signed)
 Occupational Therapy Treatment Patient Details Name: Matthew Edwards MRN: [id number] DOB: [birth year] Today's Date: 10/18/2023   History of present illness Pt is an 83 y/o M presenting to ED On 5/13 with BLE edema, incr drowsiness, had not been taking Lasix  at home. Admitted for CHF exacerbation w/ small R pleural effusion. Recent fall, CT head negative. PMH includes CAD s/p CABG and stenting, A fib, chronic bowel incontinence, AVR, HTN, dementia   OT comments  Pt making slow progress with functional goals. Pt limited by fatigue, HOH and cognition. Patient requires max /mod A to sit-stand from chair with RW to SPT to [facility]. Pt requires mod/min A with multimodal cues. Pt participated in UB and LB ADLs with increased time to initiate and complete activity. Pt would benefit from acute OT services to address impairments to maximize level of function and safety      If plan is discharge home, recommend the following:  A lot of help with bathing/dressing/bathroom;Assistance with cooking/housework;Direct supervision/assist for financial management;Direct supervision/assist for medications management;Assist for transportation;Supervision due to cognitive status;Help with stairs or ramp for entrance;A lot of help with walking and/or transfers   Equipment Recommendations  BSC/3in1;Tub/shower seat    Recommendations for Other Services      Precautions / Restrictions Precautions Precautions: Fall Recall of Precautions/Restrictions: Impaired Precaution/Restrictions Comments: watch O2 Restrictions Weight Bearing Restrictions Per Provider Order: No       Mobility Bed Mobility               General bed mobility comments: pt in chair upon arrival    Transfers Overall transfer level: Needs assistance Equipment used: Rolling walker (2 wheels) Transfers: Sit to/from Stand Sit to Stand: Max assist, Mod assist           General transfer comment: Increased time and effort     Balance  Overall balance assessment: Needs assistance   Sitting balance-Leahy Scale: Fair     Standing balance support: Bilateral upper extremity supported, During functional activity, Reliant on assistive device for balance Standing balance-Leahy Scale: Poor                             ADL either performed or assessed with clinical judgement   ADL Overall ADL's : Needs assistance/impaired     Grooming: Wash/dry hands;Wash/dry face;Contact guard assist;Standing           Upper Body Dressing : Minimal assistance;Sitting   Lower Body Dressing: Moderate assistance;Sit to/from stand;Sitting/lateral leans   Toilet Transfer: Moderate assistance;Stand-pivot;Rolling walker (2 wheels);Cueing for safety;Cueing for sequencing   Toileting- Clothing Manipulation and Hygiene: Minimal assistance;Sit to/from stand       Functional mobility during ADLs: Maximal assistance;Moderate assistance;Rolling walker (2 wheels);Cueing for safety;Cueing for sequencing      Extremity/Trunk Assessment Upper Extremity Assessment Upper Extremity Assessment: Generalized weakness   Lower Extremity Assessment Lower Extremity Assessment: Defer to PT evaluation        Vision Baseline Vision/History: 1 Wears glasses Ability to See in Adequate Light: 0 Adequate Patient Visual Report: No change from baseline     Perception     Praxis     Communication Communication Communication: Impaired Factors Affecting Communication: Hearing impaired   Cognition Arousal: Alert Behavior During Therapy: Flat affect                                 Following commands: Impaired  Following commands impaired: Follows one step commands inconsistently, Follows one step commands with increased time      Cueing   Cueing Techniques: Verbal cues, Gestural cues, Tactile cues  Exercises      Shoulder Instructions       General Comments      Pertinent Vitals/ Pain       Pain Assessment Pain  Assessment: No/denies pain  Home Living                                          Prior Functioning/Environment              Frequency  Min 2X/week        Progress Toward Goals  OT Goals(current goals can now be found in the care plan section)  Progress towards OT goals: Progressing toward goals     Plan      Co-evaluation                 AM-PAC OT "6 Clicks" Daily Activity     Outcome Measure   Help from another person eating meals?: None Help from another person taking care of personal grooming?: A Little Help from another person toileting, which includes using toliet, bedpan, or urinal?: A Little Help from another person bathing (including washing, rinsing, drying)?: A Lot Help from another person to put on and taking off regular upper body clothing?: A Little Help from another person to put on and taking off regular lower body clothing?: A Lot 6 Click Score: 17    End of Session Equipment Utilized During Treatment: Gait belt;Rolling walker (2 wheels)  OT Visit Diagnosis: Unsteadiness on feet (R26.81);Other abnormalities of gait and mobility (R26.89);Muscle weakness (generalized) (M62.81);History of falling (Z91.81);Other symptoms and signs involving cognitive function   Activity Tolerance Patient tolerated treatment well   Patient Left with call bell/phone within reach;in chair;with chair alarm set   Nurse Communication Mobility status        Time: 2956-2130 OT Time Calculation (min): 26 min  Charges: OT General Charges $OT Visit: 1 Visit OT Treatments $Self Care/Home Management : 8-22 mins $Therapeutic Activity: 8-22 mins    Alfred Ann 10/18/2023, 2:41 PM

## 2023-10-18 NOTE — Care Management Important Message (Signed)
 Important Message  Patient Details  Name: Matthew Edwards MRN: [id number] Date of Birth: June 13, 1940   Important Message Given:  Yes - Medicare IM   Patient left prior to IM delivery will mail a copy to the patient home address  Wynonia Hedges 10/18/2023, 4:08 PM

## 2023-10-18 NOTE — Assessment & Plan Note (Deleted)
 Dementia: Continue home donepezil  10 mg at bedtime Atrial fibrillation: Continue home metoprolol  as well as warfarin given aortic valve replacement COPD: Continue Breo Ellipta  daily, O2 goal adjusted to 88-92%

## 2023-10-18 NOTE — Discharge Summary (Signed)
 Family Medicine Teaching [facility] Discharge Summary  Patient name: Matthew Edwards Medical record number: [id number] Date of birth: [birth year] Age: 83 y.o. Gender: male Date of Admission: 10/12/2023  Date of Discharge: 10/18/2023 Admitting Physician: No admitting provider for patient encounter.  Primary Care Provider: Ulysees Gander, MD Consultants: Cardiology  Indication for Hospitalization: CHF exacerbation  Brief Hospital Course:  Matthew Edwards is a 83 y.o. year old with a history of CAD, COPD, HTN, OSA, Afib, HFpEF and aortic valve replacement who presented with dyspnea and was admitted to the [facility] Medicine Teaching service for acute hypoxemic respiratory failure suspected d/t CHF exacerbation.  Acute hypoxemic respiratory failure  CHF exacerbation In the ED, labs mostly unremarkable.  BNP mildly elevated to 176.  Troponins flat.  CXR did show cardiomegaly and evidence of CHF.  CT head was obtained this patient was somewhat slow to answer questions and baseline mentation unknown; this was unremarkable.  EKG with atrial fibrillation, rare PVCs.  Lasix  20 mg IV was continued x2 days, but I&Os were not documented while in the ED.  On day 3, patient resumed home Lasix  20 mg PO and was successfully weaned to room air.  Repeat echo showed unchanged EF.  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  Cardiology was consulted, who recommended repeat echo in 1 year for surveillance.  Other chronic conditions were medically managed with home medications and formulary alternatives as necessary (dementia, A fib, COPD).  PCP Follow-up Recommendations: Dr. Albert Huff, Cardiology, recommends repeat echocardiogram in 1 year for further surveillance of fibroblastoma. Metoprolol  dose decreased from 100mg  to 50mg  due to HR; consider dose adjustment as necessary.   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  NAD. Cardiovascular: RRR Respiratory: CTA anteriorly, normal work of breathing on room air. Extremities: No LE edema.  Moves all equally.  Skin warm and dry.  Significant Procedures: None  Significant Labs and Imaging:  No results for input(s): "WBC", "HGB", "HCT", "PLT" in the last 48 hours. Recent Labs  Lab 10/17/23 1238  NA 134*  K 3.8  CL 94*  CO2 31  GLUCOSE 153*  BUN 24*  CREATININE 1.23  CALCIUM  9.7   5/13 CT head: IMPRESSION: 1. No CT evidence for acute intracranial abnormality. 2. Atrophy and chronic small vessel ischemic changes of the white matter.   5/13 CXR: IMPRESSION: Cardiomegaly with findings of CHF and small right pleural effusion.  Results/Tests Pending at Time of Discharge: None  Discharge Medications:  Allergies as of 10/18/2023       Reactions   Pineapple Concentrate Nausea And Vomiting        Medication List     STOP taking these medications    tiZANidine 2 MG tablet Commonly known as: ZANAFLEX       TAKE these medications    acetaminophen  500 MG tablet Commonly known as: TYLENOL   Take 500 mg by mouth in the morning and at bedtime.   budesonide -formoterol  80-4.5 MCG/ACT inhaler Commonly known as: Symbicort  Inhale 2 puffs into the lungs 2 (two) times daily.   donepezil  10 MG tablet Commonly known  as: ARICEPT  Take 10 mg by mouth daily.   feeding supplement Liqd Take 237 mLs by mouth 2 (two) times daily between meals.   furosemide  20 MG tablet Commonly known as: LASIX  TAKE 1 TABLET BY MOUTH DAILY   gabapentin  100 MG capsule Commonly known as: NEURONTIN  TAKE 1 CAPSULE BY MOUTH AT  BEDTIME   metoprolol  succinate 50 MG 24 hr tablet Commonly known as: TOPROL -XL Take 1 tablet (50 mg total) by mouth daily. Take with or immediately following a meal. Start taking on: Oct 19, 2023 What changed:  medication strength how much to take additional instructions   multivitamin with minerals Tabs tablet Take 1 tablet by mouth daily.   nitroGLYCERIN  0.4 MG SL tablet Commonly known as: NITROSTAT  Place 0.4 mg under the tongue every 5 (five) minutes as needed for chest pain.   QUEtiapine  50 MG tablet Commonly known as: SEROQUEL  Take 50 mg by mouth daily.   VITAMIN C  PO Take 1 tablet by mouth daily.   warfarin 5 MG tablet Commonly known as: COUMADIN  Take 5 mg by mouth daily.        Discharge Instructions: Please refer to Patient Instructions section of EMR for full details.  Patient was counseled important signs and symptoms that should prompt return to medical care, changes in medications, dietary instructions, activity restrictions, and follow up appointments.   Follow-Up Appointments:  Contact information for follow-up providers     [place], Peter M, MD Follow up.   Specialty: Cardiology Why: Follow up with Dr [place] on 02/15/24 at 11 am. Please arrive 15 minutes early. Contact information: [address] [place] [address] [phone]              Contact information for after-discharge care     Destination     [facility] Preferred SNF .   Service: Skilled Nursing Contact information: [address] [place]  [address] [phone]                     Omar Bibber, DO 10/18/2023,  11:59 AM PGY-1, [facility]

## 2023-10-18 NOTE — Assessment & Plan Note (Deleted)
 In setting of CHF exacerbation.  Now euvolemic on exam.  Currently on 1-2L [place] with appropriate O2 saturations. - Supplement O2 to goal O2 88-92%, otherwise RA - Continue home metoprolol  100 mg daily - Continue home Lasix  20 mg PO

## 2023-10-18 NOTE — Assessment & Plan Note (Deleted)
 Cardiology recommends annual echo for surveillance.

## 2023-10-18 NOTE — Progress Notes (Signed)
 Report called to Denetrice at [facility]

## 2023-10-22 ENCOUNTER — Ambulatory Visit: Admitting: Family Medicine

## 2023-10-22 NOTE — Progress Notes (Deleted)
   SUBJECTIVE:   CHIEF COMPLAINT / HPI:  Matthew Edwards is a 83 y.o. male with a pertinent past medical history of CAD, COPD, HTN, OSA, Afib, HFpEF, and aortic valve replacement presenting to the clinic for hospital follow up.  Hospital follow-up  CHF exacerbation Brief hospital course: The patient was hospitalized from 5/13-5/19 with FMTS.  He was admitted with acute hypoxemic respiratory failure.  BNP mildly elevated to 176 CXR did show evidence of volume overload.  EKG with atrial fibrillation, rare PVCs.  Lasix  20 mg IV was continued x2 days and on day 3, patient resumed home Lasix  20 mg PO and was successfully weaned to room air.  Repeat echo showed unchanged EF. Metoprolol  dose decreased from 100mg  to 50mg  due to HR; consider dose adjustment as necessary.*** Today, patient is ***.  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  Cardiology was consulted, who recommended repeat echo in 1 year for surveillance.   PERTINENT PMH / PSH: CAD, COPD, HTN, OSA, Afib, HFpEF   OBJECTIVE:   There were no vitals taken for this visit.  General: Age-appropriate, resting comfortably in chair, NAD, alert and at baseline. HEENT:  Head: Normocephalic, atraumatic. No tenderness to percussion over sinuses. Eyes: PERRLA. No conjunctival erythema or scleral injections. Ears: TMs non-bulging and non-erythematous bilaterally. No erythema of external ear canal. No cerumen impaction. Nose: Non-erythematous turbinates. No rhinorrhea. Mouth/Oral: Clear, no tonsillar exudate. MMM. Neck: Supple. No LAD. Cardiovascular: Regular rate and rhythm. Normal S1/S2. No murmurs, rubs, or gallops appreciated. 2+ radial pulses. Pulmonary: Clear bilaterally to ascultation. No wheezes, crackles, or rhonchi. Normal WOB on room air. No accessory muscle use. Abdominal: No tenderness to deep or light palpation. No rebound or guarding. No HSM. Skin: Warm and  dry. Extremities: No peripheral edema bilaterally. Capillary refill <2 seconds.    ASSESSMENT/PLAN:   Assessment & Plan   No follow-ups on file.  Bandy Honaker [place] Planas, MD [facility]

## 2023-11-27 ENCOUNTER — Encounter (HOSPITAL_COMMUNITY): Payer: Self-pay

## 2023-11-27 ENCOUNTER — Other Ambulatory Visit: Payer: Self-pay

## 2023-11-27 ENCOUNTER — Emergency Department (HOSPITAL_COMMUNITY)

## 2023-11-27 ENCOUNTER — Inpatient Hospital Stay (HOSPITAL_COMMUNITY)
Admission: EM | Admit: 2023-11-27 | Discharge: 2023-12-04 | DRG: 291 | Disposition: A | Discharge status: Home Health | Attending: Internal Medicine | Admitting: Internal Medicine

## 2023-11-27 DIAGNOSIS — Z7951 Long term (current) use of inhaled steroids: Secondary | ICD-10-CM

## 2023-11-27 DIAGNOSIS — I5033 Acute on chronic diastolic (congestive) heart failure: Principal | ICD-10-CM | POA: Diagnosis present

## 2023-11-27 DIAGNOSIS — Z951 Presence of aortocoronary bypass graft: Secondary | ICD-10-CM

## 2023-11-27 DIAGNOSIS — Z79899 Other long term (current) drug therapy: Secondary | ICD-10-CM

## 2023-11-27 DIAGNOSIS — Z8709 Personal history of other diseases of the respiratory system: Secondary | ICD-10-CM

## 2023-11-27 DIAGNOSIS — Z91018 Allergy to other foods: Secondary | ICD-10-CM

## 2023-11-27 DIAGNOSIS — J918 Pleural effusion in other conditions classified elsewhere: Secondary | ICD-10-CM | POA: Diagnosis present

## 2023-11-27 DIAGNOSIS — Z9981 Dependence on supplemental oxygen: Secondary | ICD-10-CM

## 2023-11-27 DIAGNOSIS — Z87891 Personal history of nicotine dependence: Secondary | ICD-10-CM

## 2023-11-27 DIAGNOSIS — Z955 Presence of coronary angioplasty implant and graft: Secondary | ICD-10-CM

## 2023-11-27 DIAGNOSIS — I509 Heart failure, unspecified: Secondary | ICD-10-CM | POA: Diagnosis not present

## 2023-11-27 DIAGNOSIS — F411 Generalized anxiety disorder: Secondary | ICD-10-CM | POA: Diagnosis present

## 2023-11-27 DIAGNOSIS — F0394 Unspecified dementia, unspecified severity, with anxiety: Secondary | ICD-10-CM | POA: Diagnosis present

## 2023-11-27 DIAGNOSIS — I4819 Other persistent atrial fibrillation: Secondary | ICD-10-CM | POA: Diagnosis present

## 2023-11-27 DIAGNOSIS — I10 Essential (primary) hypertension: Secondary | ICD-10-CM | POA: Diagnosis present

## 2023-11-27 DIAGNOSIS — Z7984 Long term (current) use of oral hypoglycemic drugs: Secondary | ICD-10-CM

## 2023-11-27 DIAGNOSIS — I110 Hypertensive heart disease with heart failure: Principal | ICD-10-CM | POA: Diagnosis present

## 2023-11-27 DIAGNOSIS — Z953 Presence of xenogenic heart valve: Secondary | ICD-10-CM

## 2023-11-27 DIAGNOSIS — J449 Chronic obstructive pulmonary disease, unspecified: Secondary | ICD-10-CM | POA: Diagnosis present

## 2023-11-27 DIAGNOSIS — E119 Type 2 diabetes mellitus without complications: Secondary | ICD-10-CM

## 2023-11-27 DIAGNOSIS — I50813 Acute on chronic right heart failure: Secondary | ICD-10-CM | POA: Diagnosis present

## 2023-11-27 DIAGNOSIS — E1140 Type 2 diabetes mellitus with diabetic neuropathy, unspecified: Secondary | ICD-10-CM | POA: Diagnosis present

## 2023-11-27 DIAGNOSIS — Z7901 Long term (current) use of anticoagulants: Secondary | ICD-10-CM

## 2023-11-27 DIAGNOSIS — I081 Rheumatic disorders of both mitral and tricuspid valves: Secondary | ICD-10-CM | POA: Diagnosis present

## 2023-11-27 DIAGNOSIS — I2729 Other secondary pulmonary hypertension: Secondary | ICD-10-CM | POA: Diagnosis present

## 2023-11-27 DIAGNOSIS — Z1152 Encounter for screening for COVID-19: Secondary | ICD-10-CM

## 2023-11-27 DIAGNOSIS — Z8249 Family history of ischemic heart disease and other diseases of the circulatory system: Secondary | ICD-10-CM

## 2023-11-27 DIAGNOSIS — Z8679 Personal history of other diseases of the circulatory system: Secondary | ICD-10-CM

## 2023-11-27 DIAGNOSIS — I959 Hypotension, unspecified: Secondary | ICD-10-CM | POA: Diagnosis present

## 2023-11-27 DIAGNOSIS — I2510 Atherosclerotic heart disease of native coronary artery without angina pectoris: Secondary | ICD-10-CM | POA: Diagnosis present

## 2023-11-27 DIAGNOSIS — I480 Paroxysmal atrial fibrillation: Secondary | ICD-10-CM | POA: Diagnosis present

## 2023-11-27 DIAGNOSIS — E876 Hypokalemia: Secondary | ICD-10-CM | POA: Diagnosis present

## 2023-11-27 DIAGNOSIS — F0390 Unspecified dementia without behavioral disturbance: Secondary | ICD-10-CM | POA: Diagnosis present

## 2023-11-27 DIAGNOSIS — J9621 Acute and chronic respiratory failure with hypoxia: Secondary | ICD-10-CM | POA: Diagnosis present

## 2023-11-27 DIAGNOSIS — E785 Hyperlipidemia, unspecified: Secondary | ICD-10-CM | POA: Diagnosis present

## 2023-11-27 LAB — HEPATIC FUNCTION PANEL
ALT: 12 U/L (ref 0–44)
AST: 20 U/L (ref 15–41)
Albumin: 3.1 g/dL — ABNORMAL LOW (ref 3.5–5.0)
Alkaline Phosphatase: 68 U/L (ref 38–126)
Bilirubin, Direct: 0.2 mg/dL (ref 0.0–0.2)
Indirect Bilirubin: 0.6 mg/dL (ref 0.3–0.9)
Total Bilirubin: 0.8 mg/dL (ref 0.0–1.2)
Total Protein: 6.6 g/dL (ref 6.5–8.1)

## 2023-11-27 LAB — CBC
HCT: 38 % — ABNORMAL LOW (ref 39.0–52.0)
Hemoglobin: 11.3 g/dL — ABNORMAL LOW (ref 13.0–17.0)
MCH: 27 pg (ref 26.0–34.0)
MCHC: 29.7 g/dL — ABNORMAL LOW (ref 30.0–36.0)
MCV: 90.7 fL (ref 80.0–100.0)
Platelets: 189 10*3/uL (ref 150–400)
RBC: 4.19 MIL/uL — ABNORMAL LOW (ref 4.22–5.81)
RDW: 16.8 % — ABNORMAL HIGH (ref 11.5–15.5)
WBC: 6.7 10*3/uL (ref 4.0–10.5)
nRBC: 0 % (ref 0.0–0.2)

## 2023-11-27 LAB — BASIC METABOLIC PANEL WITH GFR
Anion gap: 8 (ref 5–15)
BUN: 11 mg/dL (ref 8–23)
CO2: 32 mmol/L (ref 22–32)
Calcium: 9.3 mg/dL (ref 8.9–10.3)
Chloride: 103 mmol/L (ref 98–111)
Creatinine, Ser: 0.87 mg/dL (ref 0.61–1.24)
GFR, Estimated: >60 mL/min (ref >60)
Glucose, Bld: 139 mg/dL — ABNORMAL HIGH (ref 70–99)
Potassium: 3.4 mmol/L — ABNORMAL LOW (ref 3.5–5.1)
Sodium: 143 mmol/L (ref 135–145)

## 2023-11-27 LAB — TROPONIN I (HIGH SENSITIVITY): Troponin I (High Sensitivity): 9 ng/L (ref <18)

## 2023-11-27 LAB — BRAIN NATRIURETIC PEPTIDE: B Natriuretic Peptide: 267.4 pg/mL — ABNORMAL HIGH (ref 0.0–100.0)

## 2023-11-27 MED ORDER — FUROSEMIDE 10 MG/ML IJ SOLN
40.0000 mg | Freq: Once | INTRAMUSCULAR | Status: AC
  Administered 2023-11-27: 40 mg via INTRAVENOUS
  Filled 2023-11-27: qty 4

## 2023-11-27 NOTE — ED Provider Notes (Signed)
 [facility] AT [facility] Provider Note   CSN: [id number] Arrival date & time: 11/27/23  2247     Patient presents with: Shortness of Breath   Matthew Edwards is a 83 y.o. male.  {Add pertinent medical, surgical, social history, OB history to YEP:67052} Patient with a history of hypertension, bioprosthetic aortic valve replacement, atrial fibrillation, COPD, diabetes, CHF, dementia here with shortness of breath progressively worsening over the past 1 week.  He is a poor historian.  He reports increasing shortness of breath for at least 3 to 4 days despite taking his medications at home.  Has noted shortness of breath worse with exertion and bilateral leg swelling.  Increase his home O2 from 2 L to 5.  Not hypoxic for EMS.  Some chest tightness with deep breathing.  Denies any fever.  Nonproductive cough.  No abdominal pain, vomiting, cough, runny nose or sore throat.  Does not think he takes any blood thinners  The history is provided by the patient.  Shortness of Breath Associated symptoms: cough   Associated symptoms: no abdominal pain, no chest pain, no fever, no headaches, no rash and no vomiting        Prior to Admission medications   Medication Sig Start Date End Date Taking? Authorizing Provider  acetaminophen  (TYLENOL ) 500 MG tablet Take 500 mg by mouth in the morning and at bedtime.    [provider]  Ascorbic Acid  (VITAMIN C  PO) Take 1 tablet by mouth daily.    [provider]  budesonide -formoterol  (SYMBICORT ) 80-4.5 MCG/ACT inhaler Inhale 2 puffs into the lungs 2 (two) times daily. 02/07/21   Jeanelle Layman CROME, MD  donepezil  (ARICEPT ) 10 MG tablet Take 10 mg by mouth daily. 09/17/23   [provider]  feeding supplement (ENSURE ENLIVE / ENSURE PLUS) LIQD Take 237 mLs by mouth 2 (two) times daily between meals. 10/18/23   Cleotilde Perkins, DO  furosemide  (LASIX ) 20 MG tablet TAKE 1 TABLET BY MOUTH DAILY Patient taking  differently: Take 20 mg by mouth daily. 08/11/21   Chambliss, Layman CROME, MD  gabapentin  (NEURONTIN ) 100 MG capsule TAKE 1 CAPSULE BY MOUTH AT  BEDTIME Patient taking differently: Take 100 mg by mouth at bedtime. 09/15/21   Jeanelle Layman CROME, MD  metoprolol  succinate (TOPROL -XL) 50 MG 24 hr tablet Take 1 tablet (50 mg total) by mouth daily. Take with or immediately following a meal. 10/19/23   Anders Otto DASEN, MD  Multiple Vitamin (MULTIVITAMIN WITH MINERALS) TABS tablet Take 1 tablet by mouth daily.    [provider]  nitroGLYCERIN  (NITROSTAT ) 0.4 MG SL tablet Place 0.4 mg under the tongue every 5 (five) minutes as needed for chest pain. Patient not taking: Reported on 10/12/2023 06/15/23   [provider]  QUEtiapine  (SEROQUEL ) 50 MG tablet Take 50 mg by mouth daily. 03/05/23   [provider]  warfarin (COUMADIN ) 5 MG tablet Take 5 mg by mouth daily.    [provider]    Allergies: Pineapple concentrate    Review of Systems  Constitutional:  Negative for activity change, appetite change and fever.  HENT:  Negative for congestion and rhinorrhea.   Respiratory:  Positive for cough and shortness of breath. Negative for choking and chest tightness.   Cardiovascular:  Positive for leg swelling. Negative for chest pain.  Gastrointestinal:  Negative for abdominal pain, nausea and vomiting.  Genitourinary:  Negative for dysuria and hematuria.  Musculoskeletal:  Negative for arthralgias and myalgias.  Skin:  Negative for rash.  Neurological:  Negative for dizziness, weakness and headaches.   all other systems are negative except as noted in the HPI and PMH.    Updated Vital Signs There were no vitals taken for this visit.  Physical Exam Vitals and nursing note reviewed.  Constitutional:      General: He is not in acute distress.    Appearance: He is well-developed.     Comments: Hard of hearing, dyspneic with conversation  HENT:     Head:  Normocephalic and atraumatic.     Mouth/Throat:     Pharynx: No oropharyngeal exudate.   Eyes:     Conjunctiva/sclera: Conjunctivae normal.     Pupils: Pupils are equal, round, and reactive to light.   Neck:     Comments: No meningismus. Cardiovascular:     Rate and Rhythm: Normal rate and regular rhythm.     Heart sounds: Normal heart sounds. No murmur heard. Pulmonary:     Effort: Pulmonary effort is normal. No respiratory distress.     Breath sounds: Rales present.     Comments: Bibasilar crackles Abdominal:     Palpations: Abdomen is soft.     Tenderness: There is no abdominal tenderness. There is no guarding or rebound.   Musculoskeletal:        General: No tenderness. Normal range of motion.     Cervical back: Normal range of motion and neck supple.     Right lower leg: Edema present.     Left lower leg: Edema present.   Skin:    General: Skin is warm.   Neurological:     Mental Status: He is alert and oriented to person, place, and time.     Cranial Nerves: No cranial nerve deficit.     Motor: No abnormal muscle tone.     Coordination: Coordination normal.     Comments: No ataxia on finger to nose bilaterally. No pronator drift. 5/5 strength throughout. CN 2-12 intact.Equal grip strength. Sensation intact.   Psychiatric:        Behavior: Behavior normal.     (all labs ordered are listed, but only abnormal results are displayed) Labs Reviewed  RESP PANEL BY RT-PCR (RSV, FLU A&B, COVID)  [id number]  BASIC METABOLIC PANEL WITH GFR  CBC  BRAIN NATRIURETIC PEPTIDE  HEPATIC FUNCTION PANEL  TROPONIN I (HIGH SENSITIVITY)    EKG: None  Radiology: No results found.  {Document cardiac monitor, telemetry assessment procedure when appropriate:32947} Procedures   Medications Ordered in the ED  furosemide  (LASIX ) injection 40 mg (has no administration in time range)      {Click here for ABCD2, HEART and other calculators REFRESH Note before signing:1}                               Medical Decision Making Amount and/or Complexity of Data Reviewed Independent Historian: EMS Labs: ordered. Decision-making details documented in ED Course. Radiology: ordered and independent interpretation performed. Decision-making details documented in ED Course. ECG/medicine tests: ordered and independent interpretation performed. Decision-making details documented in ED Course.  Risk Prescription drug management.   1 week of progressive worsening shortness of breath with leg swelling.  History of chronic O2 use that had to be increased by EMS.  Noticed leg swelling and shortness of breath.  Concern for CHF exacerbation.  Will obtain labs, EKG, chest x-ray, give IV Lasix .  Ejection fraction was normal on last  echocardiogram  {Document critical care time when appropriate  Document review of labs and clinical decision tools ie [id number], etc  Document your independent review of radiology images and any outside records  Document your discussion with family members, caretakers and with consultants  Document social determinants of health affecting pt's care  Document your decision making why or why not admission, treatments were needed:32947:::1}   Final diagnoses:  None    ED Discharge Orders     None

## 2023-11-27 NOTE — ED Triage Notes (Signed)
 Patient is coming from home. Chief complaint of shortness of breath and bilateral leg swelling x2 days. Patient is on 5L per EMS, stated it has helped a little. Chronically on 2L. Hx of afib and dementia. Aox3 at baseline EMS VS 138/78 BP 77 HR afib 20 RR 100% 5L 139 CBG

## 2023-11-27 NOTE — ED Notes (Signed)
 Daughter, health POA, at the bedside and updated.

## 2023-11-28 ENCOUNTER — Encounter (HOSPITAL_COMMUNITY): Payer: Self-pay | Admitting: Internal Medicine

## 2023-11-28 DIAGNOSIS — Z7901 Long term (current) use of anticoagulants: Secondary | ICD-10-CM | POA: Diagnosis not present

## 2023-11-28 DIAGNOSIS — I50813 Acute on chronic right heart failure: Secondary | ICD-10-CM | POA: Diagnosis present

## 2023-11-28 DIAGNOSIS — J90 Pleural effusion, not elsewhere classified: Secondary | ICD-10-CM | POA: Diagnosis not present

## 2023-11-28 DIAGNOSIS — I2729 Other secondary pulmonary hypertension: Secondary | ICD-10-CM | POA: Diagnosis present

## 2023-11-28 DIAGNOSIS — J449 Chronic obstructive pulmonary disease, unspecified: Secondary | ICD-10-CM | POA: Diagnosis present

## 2023-11-28 DIAGNOSIS — E1140 Type 2 diabetes mellitus with diabetic neuropathy, unspecified: Secondary | ICD-10-CM | POA: Diagnosis present

## 2023-11-28 DIAGNOSIS — Z87891 Personal history of nicotine dependence: Secondary | ICD-10-CM | POA: Diagnosis not present

## 2023-11-28 DIAGNOSIS — I480 Paroxysmal atrial fibrillation: Secondary | ICD-10-CM | POA: Diagnosis not present

## 2023-11-28 DIAGNOSIS — I10 Essential (primary) hypertension: Secondary | ICD-10-CM | POA: Diagnosis not present

## 2023-11-28 DIAGNOSIS — Z8679 Personal history of other diseases of the circulatory system: Secondary | ICD-10-CM

## 2023-11-28 DIAGNOSIS — E876 Hypokalemia: Secondary | ICD-10-CM | POA: Diagnosis present

## 2023-11-28 DIAGNOSIS — J9611 Chronic respiratory failure with hypoxia: Secondary | ICD-10-CM | POA: Insufficient documentation

## 2023-11-28 DIAGNOSIS — I509 Heart failure, unspecified: Secondary | ICD-10-CM | POA: Diagnosis present

## 2023-11-28 DIAGNOSIS — I959 Hypotension, unspecified: Secondary | ICD-10-CM | POA: Diagnosis present

## 2023-11-28 DIAGNOSIS — I2510 Atherosclerotic heart disease of native coronary artery without angina pectoris: Secondary | ICD-10-CM | POA: Diagnosis present

## 2023-11-28 DIAGNOSIS — J918 Pleural effusion in other conditions classified elsewhere: Secondary | ICD-10-CM | POA: Diagnosis present

## 2023-11-28 DIAGNOSIS — J9621 Acute and chronic respiratory failure with hypoxia: Secondary | ICD-10-CM | POA: Diagnosis present

## 2023-11-28 DIAGNOSIS — G629 Polyneuropathy, unspecified: Secondary | ICD-10-CM | POA: Insufficient documentation

## 2023-11-28 DIAGNOSIS — Z953 Presence of xenogenic heart valve: Secondary | ICD-10-CM | POA: Diagnosis not present

## 2023-11-28 DIAGNOSIS — Z79899 Other long term (current) drug therapy: Secondary | ICD-10-CM | POA: Diagnosis not present

## 2023-11-28 DIAGNOSIS — F411 Generalized anxiety disorder: Secondary | ICD-10-CM | POA: Insufficient documentation

## 2023-11-28 DIAGNOSIS — D151 Benign neoplasm of heart: Secondary | ICD-10-CM

## 2023-11-28 DIAGNOSIS — I110 Hypertensive heart disease with heart failure: Secondary | ICD-10-CM | POA: Diagnosis present

## 2023-11-28 DIAGNOSIS — I4819 Other persistent atrial fibrillation: Secondary | ICD-10-CM | POA: Diagnosis present

## 2023-11-28 DIAGNOSIS — E785 Hyperlipidemia, unspecified: Secondary | ICD-10-CM | POA: Diagnosis present

## 2023-11-28 DIAGNOSIS — I081 Rheumatic disorders of both mitral and tricuspid valves: Secondary | ICD-10-CM | POA: Diagnosis present

## 2023-11-28 DIAGNOSIS — Z951 Presence of aortocoronary bypass graft: Secondary | ICD-10-CM | POA: Diagnosis not present

## 2023-11-28 DIAGNOSIS — F0390 Unspecified dementia without behavioral disturbance: Secondary | ICD-10-CM

## 2023-11-28 DIAGNOSIS — Z7984 Long term (current) use of oral hypoglycemic drugs: Secondary | ICD-10-CM | POA: Diagnosis not present

## 2023-11-28 DIAGNOSIS — I5033 Acute on chronic diastolic (congestive) heart failure: Secondary | ICD-10-CM | POA: Diagnosis present

## 2023-11-28 DIAGNOSIS — Z7951 Long term (current) use of inhaled steroids: Secondary | ICD-10-CM | POA: Diagnosis not present

## 2023-11-28 DIAGNOSIS — Z1152 Encounter for screening for COVID-19: Secondary | ICD-10-CM | POA: Diagnosis not present

## 2023-11-28 DIAGNOSIS — Z8709 Personal history of other diseases of the respiratory system: Secondary | ICD-10-CM

## 2023-11-28 DIAGNOSIS — F0394 Unspecified dementia, unspecified severity, with anxiety: Secondary | ICD-10-CM | POA: Diagnosis present

## 2023-11-28 DIAGNOSIS — E119 Type 2 diabetes mellitus without complications: Secondary | ICD-10-CM

## 2023-11-28 LAB — CBC
HCT: 36.2 % — ABNORMAL LOW (ref 39.0–52.0)
Hemoglobin: 10.8 g/dL — ABNORMAL LOW (ref 13.0–17.0)
MCH: 27.6 pg (ref 26.0–34.0)
MCHC: 29.8 g/dL — ABNORMAL LOW (ref 30.0–36.0)
MCV: 92.3 fL (ref 80.0–100.0)
Platelets: 169 10*3/uL (ref 150–400)
RBC: 3.92 MIL/uL — ABNORMAL LOW (ref 4.22–5.81)
RDW: 17 % — ABNORMAL HIGH (ref 11.5–15.5)
WBC: 5.1 10*3/uL (ref 4.0–10.5)
nRBC: 0 % (ref 0.0–0.2)

## 2023-11-28 LAB — RESP PANEL BY RT-PCR (RSV, FLU A&B, COVID)  RVPGX2
Influenza A by PCR: NEGATIVE
Influenza B by PCR: NEGATIVE
Resp Syncytial Virus by PCR: NEGATIVE
SARS Coronavirus 2 by RT PCR: NEGATIVE

## 2023-11-28 LAB — PROTIME-INR
INR: 1.3 — ABNORMAL HIGH (ref 0.8–1.2)
Prothrombin Time: 17.4 s — ABNORMAL HIGH (ref 11.4–15.2)

## 2023-11-28 LAB — COMPREHENSIVE METABOLIC PANEL WITH GFR
ALT: 12 U/L (ref 0–44)
AST: 19 U/L (ref 15–41)
Albumin: 3.3 g/dL — ABNORMAL LOW (ref 3.5–5.0)
Alkaline Phosphatase: 70 U/L (ref 38–126)
Anion gap: 9 (ref 5–15)
BUN: 9 mg/dL (ref 8–23)
CO2: 31 mmol/L (ref 22–32)
Calcium: 8.8 mg/dL — ABNORMAL LOW (ref 8.9–10.3)
Chloride: 102 mmol/L (ref 98–111)
Creatinine, Ser: 0.81 mg/dL (ref 0.61–1.24)
GFR, Estimated: >60 mL/min (ref >60)
Glucose, Bld: 106 mg/dL — ABNORMAL HIGH (ref 70–99)
Potassium: 3.6 mmol/L (ref 3.5–5.1)
Sodium: 142 mmol/L (ref 135–145)
Total Bilirubin: 1.1 mg/dL (ref 0.0–1.2)
Total Protein: 6.5 g/dL (ref 6.5–8.1)

## 2023-11-28 LAB — HEMOGLOBIN A1C
Hgb A1c MFr Bld: 5.6 % (ref 4.8–5.6)
Mean Plasma Glucose: 114.02 mg/dL

## 2023-11-28 LAB — TROPONIN I (HIGH SENSITIVITY): Troponin I (High Sensitivity): 9 ng/L (ref <18)

## 2023-11-28 MED ORDER — ALBUMIN HUMAN 25 % IV SOLN
25.0000 g | INTRAVENOUS | Status: AC
  Administered 2023-11-28: 25 g via INTRAVENOUS
  Filled 2023-11-28: qty 100

## 2023-11-28 MED ORDER — GABAPENTIN 100 MG PO CAPS
100.0000 mg | ORAL_CAPSULE | Freq: Every day | ORAL | Status: DC
  Administered 2023-11-28 – 2023-12-03 (×7): 100 mg via ORAL
  Filled 2023-11-28 (×7): qty 1

## 2023-11-28 MED ORDER — SODIUM CHLORIDE 0.9% FLUSH: 3.0000 mL | INTRAVENOUS | Status: DC | PRN

## 2023-11-28 MED ORDER — FUROSEMIDE 10 MG/ML IJ SOLN: 40.0000 mg | Freq: Two times a day (BID) | INTRAMUSCULAR | Status: DC

## 2023-11-28 MED ORDER — SODIUM CHLORIDE 0.9 % IV SOLN
250.0000 mL | INTRAVENOUS | Status: AC | PRN
Start: 2023-11-28 — End: 2023-11-29

## 2023-11-28 MED ORDER — SPIRONOLACTONE 25 MG PO TABS: 25.0000 mg | ORAL_TABLET | Freq: Every day | ORAL | Status: DC

## 2023-11-28 MED ORDER — FUROSEMIDE 10 MG/ML IJ SOLN: 20.0000 mg | Freq: Two times a day (BID) | INTRAMUSCULAR | Status: DC

## 2023-11-28 MED ORDER — ONDANSETRON HCL 4 MG/2ML IJ SOLN: 4.0000 mg | Freq: Four times a day (QID) | INTRAMUSCULAR | Status: DC | PRN

## 2023-11-28 MED ORDER — METOPROLOL SUCCINATE ER 25 MG PO TB24: 50.0000 mg | ORAL_TABLET | Freq: Every day | ORAL | Status: DC

## 2023-11-28 MED ORDER — ACETAMINOPHEN 325 MG PO TABS
650.0000 mg | ORAL_TABLET | Freq: Four times a day (QID) | ORAL | Status: DC | PRN
  Administered 2023-12-03: 650 mg via ORAL
  Filled 2023-11-28 (×2): qty 2

## 2023-11-28 MED ORDER — TIZANIDINE HCL 4 MG PO TABS
2.0000 mg | ORAL_TABLET | Freq: Once | ORAL | Status: AC
  Administered 2023-11-28: 2 mg via ORAL
  Filled 2023-11-28: qty 1

## 2023-11-28 MED ORDER — MIDODRINE HCL 5 MG PO TABS
10.0000 mg | ORAL_TABLET | ORAL | Status: AC
  Administered 2023-11-28: 10 mg via ORAL
  Filled 2023-11-28: qty 2

## 2023-11-28 MED ORDER — QUETIAPINE FUMARATE 50 MG PO TABS
50.0000 mg | ORAL_TABLET | Freq: Every day | ORAL | Status: DC
  Administered 2023-11-28 – 2023-12-03 (×6): 50 mg via ORAL
  Filled 2023-11-28 (×6): qty 1

## 2023-11-28 MED ORDER — SPIRONOLACTONE 12.5 MG HALF TABLET
12.5000 mg | ORAL_TABLET | Freq: Every day | ORAL | Status: DC
  Administered 2023-11-28 – 2023-12-04 (×7): 12.5 mg via ORAL
  Filled 2023-11-28 (×7): qty 1

## 2023-11-28 MED ORDER — LACTATED RINGERS IV BOLUS: 500.0000 mL | Freq: Once | INTRAVENOUS | Status: DC

## 2023-11-28 MED ORDER — ACETAMINOPHEN 650 MG RE SUPP: 650.0000 mg | Freq: Four times a day (QID) | RECTAL | Status: DC | PRN

## 2023-11-28 MED ORDER — ENSURE ENLIVE PO LIQD
237.0000 mL | Freq: Two times a day (BID) | ORAL | Status: DC
  Administered 2023-11-28 – 2023-11-30 (×4): 237 mL via ORAL
  Filled 2023-11-28 (×10): qty 237

## 2023-11-28 MED ORDER — FLUTICASONE FUROATE-VILANTEROL 100-25 MCG/ACT IN AEPB
1.0000 | INHALATION_SPRAY | Freq: Every day | RESPIRATORY_TRACT | Status: DC
  Administered 2023-11-28 – 2023-12-04 (×7): 1 via RESPIRATORY_TRACT
  Filled 2023-11-28: qty 28

## 2023-11-28 MED ORDER — SODIUM CHLORIDE 0.9% FLUSH
3.0000 mL | Freq: Two times a day (BID) | INTRAVENOUS | Status: DC
  Administered 2023-11-28 – 2023-12-04 (×13): 3 mL via INTRAVENOUS

## 2023-11-28 MED ORDER — QUETIAPINE FUMARATE 50 MG PO TABS
50.0000 mg | ORAL_TABLET | Freq: Every day | ORAL | Status: DC
  Administered 2023-11-28: 50 mg via ORAL
  Filled 2023-11-28: qty 2

## 2023-11-28 MED ORDER — FUROSEMIDE 10 MG/ML IJ SOLN
60.0000 mg | Freq: Two times a day (BID) | INTRAMUSCULAR | Status: DC
  Administered 2023-11-28 – 2023-11-30 (×5): 60 mg via INTRAVENOUS
  Filled 2023-11-28 (×5): qty 6

## 2023-11-28 MED ORDER — MELATONIN 5 MG PO TABS: 5.0000 mg | ORAL_TABLET | Freq: Every evening | ORAL | Status: DC | PRN

## 2023-11-28 MED ORDER — POTASSIUM CHLORIDE CRYS ER 20 MEQ PO TBCR
40.0000 meq | EXTENDED_RELEASE_TABLET | Freq: Once | ORAL | Status: AC
  Administered 2023-11-28: 40 meq via ORAL
  Filled 2023-11-28: qty 2

## 2023-11-28 MED ORDER — IPRATROPIUM-ALBUTEROL 0.5-2.5 (3) MG/3ML IN SOLN
3.0000 mL | Freq: Four times a day (QID) | RESPIRATORY_TRACT | Status: DC | PRN
  Administered 2023-11-28: 3 mL via RESPIRATORY_TRACT
  Filled 2023-11-28: qty 3

## 2023-11-28 MED ORDER — DONEPEZIL HCL 10 MG PO TABS
10.0000 mg | ORAL_TABLET | Freq: Every day | ORAL | Status: DC
  Administered 2023-11-28 – 2023-12-04 (×7): 10 mg via ORAL
  Filled 2023-11-28 (×7): qty 1

## 2023-11-28 MED ORDER — WARFARIN - PHARMACIST DOSING INPATIENT: Freq: Every day | Status: DC

## 2023-11-28 MED ORDER — WARFARIN SODIUM 5 MG PO TABS
5.0000 mg | ORAL_TABLET | Freq: Once | ORAL | Status: AC
  Administered 2023-11-28: 5 mg via ORAL
  Filled 2023-11-28: qty 1

## 2023-11-28 MED ORDER — ALBUMIN HUMAN 25 % IV SOLN: 12.5000 g | INTRAVENOUS | Status: DC

## 2023-11-28 MED ORDER — ONDANSETRON HCL 4 MG PO TABS: 4.0000 mg | ORAL_TABLET | Freq: Four times a day (QID) | ORAL | Status: DC | PRN

## 2023-11-28 MED ORDER — SODIUM CHLORIDE 0.9% FLUSH
3.0000 mL | Freq: Two times a day (BID) | INTRAVENOUS | Status: DC
  Administered 2023-11-28 – 2023-12-04 (×11): 3 mL via INTRAVENOUS

## 2023-11-28 NOTE — ED Notes (Addendum)
 This nurse contacted attending Sundil MD regarding patient's drop in blood pressure starting at 0300. This nurse assessed patient, patient is alert and all other vitals are with normal limits. Attending stated she will be placing appropriate orders in.

## 2023-11-28 NOTE — Assessment & Plan Note (Signed)
 No signs of acute exacerbation He has chronic hypoxemia, with home supplemental 02 per [place]

## 2023-11-28 NOTE — H&P (Addendum)
 History and Physical    Matthew Edwards FMW:[id number] DOB: 02/19/1941 DOA: 11/27/2023  PCP: [name], MD   Patient coming from: Home   Chief Complaint:  Chief Complaint  Patient presents with   Shortness of Breath   ED TRIAGE note:Patient is coming from home. Chief complaint of shortness of breath and bilateral leg swelling x2 days. Patient is on 5L per EMS, stated it has helped a little. Chronically on 2L. Hx of afib and dementia. Aox3 at baseline EMS VS 138/78 BP 77 HR afib 20 RR 100% 5L 139 CBG  HPI:  Matthew Edwards is a 83 y.o. male with medical history significant of aortic stenosis s/p AVR with an Edwards pericardial valve in  2012, history of CAD s/p CABG 2012, chronic hypoxic respiratory failure 2 L oxygen  at baseline, COPD, HFpEF, paroxysmal atrial fibrillation, cardiac fibroelastoma, generalized anxiety disorder, non-insulin -dependent DM type II, peripheral neuropathy, dementia and on chronic anticoagulation with Coumadin  present emergency department complaining of shortness of breath and bilateral lower extremity swelling for 2 days.  At baseline patient uses 2 L oxygen  currently requiring 5 L per EMS with little help. Daughter reported that patient has recent INR 4 few days ago has not been taking Coumadin  for last 2 to 3 days. Multiple family members at the bedside and also daughter is on the phone as well.  Family reported that patient has worsening shortness of breath with exertional dyspnea and at home patient O2 sat dropped to 81% on 2 L and after increasing to 5 L improved around 96%. Patient has home health care and family is worried that probably they has been adjusting medication and not giving him an appropriate dose of Lasix  at home. Patient denies any chest pain, headache, blurry vision, abdomen pain nausea vomiting.  Denies any UTI symptoms. Even though patient does not have any UTI symptom family at the bedside keep insisting that they want to check  UA.   ED Course:  At presentation to ED hemodynamically stable. BMP showing low potassium 3.4 otherwise unremarkable.  CBC unremarkable stable H&H normal WBC and platelet count.  Elevated BNP 267.  Troponin 9 within normal range.  Hepatic function panel showed low albumin 3.1. Respiratory panel negative for COVID/RSV/flu. EKG showed atrial fibrillation controlled heart rate 78.  Chest x-ray showed increased right pleural effusion and moderate to large.  Pulmonary edema.  Stable cardiomegaly.  In the ED patient has been given Lasix  40 mg.  Hospitalist has been consulted for further management of acute on chronic diastolic heart failure exacerbation and bilateral pleural effusion.  Significant labs in the ED: Lab Orders         Resp panel by RT-PCR (RSV, Flu A&B, Covid) Anterior Nasal Swab         Basic metabolic panel         CBC         BNP (Order if Patient has history of Heart Failure)         Hepatic function panel         Protime-INR         Hemoglobin A1c         CBC         Comprehensive metabolic panel with GFR         Urinalysis, Routine w reflex microscopic -Urine, Clean Catch       Review of Systems:  Review of Systems  Constitutional:  Negative for chills, fever, malaise/fatigue and weight loss.  Respiratory:  Positive for shortness of breath. Negative for cough, sputum production and wheezing.   Cardiovascular:  Positive for orthopnea, leg swelling and PND. Negative for chest pain and palpitations.  Gastrointestinal:  Negative for abdominal pain, heartburn, nausea and vomiting.  Musculoskeletal:  Negative for joint pain and myalgias.  Neurological:  Negative for dizziness and headaches.  Psychiatric/Behavioral:  The patient is not nervous/anxious.     Past Medical History:  Diagnosis Date   Arthritis    Atrial fibrillation (HCC)    CAD (coronary artery disease) 2012   [place]-LAD 2 or 3 srents   Complication of anesthesia    loopy after polpy removal dec  2017, lasted several days   COPD (chronic obstructive pulmonary disease) (HCC)    Dementia (HCC)    GERD (gastroesophageal reflux disease)    HEART FAILURE, CONGESTIVE UNSPEC 02/23/2007   Qualifier: Diagnosis of  By: VALMA MD, [facility]     Hyperlipidemia    Hypertension    OSA (obstructive sleep apnea) 11/13/2014   S/P AVR    #25 mm Edwards pericardial valve Bioprosthetic. Dr Lucas.   Situational depression    son passed 11/2013   Sleep apnea    occ uses c pap does not know settings    Stented coronary artery 2013   TOBACCO DEPENDENCE 07/29/2006   Qualifier: History of  By: [place]  MD, Stephanie     Type II diabetes mellitus [facility])     Past Surgical History:  Procedure Laterality Date   AORTIC VALVE REPLACEMENT  07/2010   notes 07/28/2010   CARDIAC CATHETERIZATION  06/2010   thelbert 07/01/2010   COLONOSCOPY WITH PROPOFOL  N/A 02/08/2017   Procedure: COLONOSCOPY WITH PROPOFOL ;  Surgeon: Legrand Victory LITTIE DOUGLAS, MD;  Location: [facility] ENDOSCOPY;  Service: Gastroenterology;  Laterality: N/A;   CORONARY ANGIOPLASTY WITH STENT PLACEMENT  2006; 10/2006   2; 1/notes 10/01/2010   CORONARY ARTERY BYPASS GRAFT  07/2010   1/notes 07/28/2010   PARTIAL PROCTECTOMY BY TEM N/A 05/26/2016   Procedure: TEM PARTIAL PROCTECTOMY OF RECTAL MASS;  Surgeon: Elspeth Schultze, MD;  Location: [facility] ORS;  Service: General;  Laterality: N/A;     reports that he has quit smoking. His smoking use included cigarettes. He has a 23 pack-year smoking history. He has never used smokeless tobacco. He reports that he does not drink alcohol and does not use drugs.  Allergies  Allergen Reactions   Pineapple Concentrate Nausea And Vomiting    Family History  Problem Relation Age of Onset   Heart disease Mother    Colon cancer Neg Hx    Esophageal cancer Neg Hx    Stomach cancer Neg Hx    Pancreatic cancer Neg Hx    Liver disease Neg Hx    Colon polyps Neg Hx     Prior to Admission medications   Medication Sig Start Date End  Date Taking? Authorizing Provider  acetaminophen  (TYLENOL ) 500 MG tablet Take 500 mg by mouth in the morning and at bedtime.    [provider]  Ascorbic Acid  (VITAMIN C  PO) Take 1 tablet by mouth daily.    [provider]  budesonide -formoterol  (SYMBICORT ) 80-4.5 MCG/ACT inhaler Inhale 2 puffs into the lungs 2 (two) times daily. 02/07/21   Jeanelle Layman LITTIE, MD  donepezil  (ARICEPT ) 10 MG tablet Take 10 mg by mouth daily. 09/17/23   [provider]  feeding supplement (ENSURE ENLIVE / ENSURE PLUS) LIQD Take 237 mLs by mouth 2 (two) times daily between meals.  10/18/23   Cleotilde Perkins, DO  furosemide  (LASIX ) 20 MG tablet TAKE 1 TABLET BY MOUTH DAILY Patient taking differently: Take 20 mg by mouth daily. 08/11/21   Jeanelle Layman CROME, MD  gabapentin  (NEURONTIN ) 100 MG capsule TAKE 1 CAPSULE BY MOUTH AT  BEDTIME Patient taking differently: Take 100 mg by mouth at bedtime. 09/15/21   Jeanelle Layman CROME, MD  metoprolol  succinate (TOPROL -XL) 50 MG 24 hr tablet Take 1 tablet (50 mg total) by mouth daily. Take with or immediately following a meal. 10/19/23   Anders Otto DASEN, MD  Multiple Vitamin (MULTIVITAMIN WITH MINERALS) TABS tablet Take 1 tablet by mouth daily.    [provider]  nitroGLYCERIN  (NITROSTAT ) 0.4 MG SL tablet Place 0.4 mg under the tongue every 5 (five) minutes as needed for chest pain. Patient not taking: Reported on 10/12/2023 06/15/23   [provider]  QUEtiapine  (SEROQUEL ) 50 MG tablet Take 50 mg by mouth daily. 03/05/23   [provider]  warfarin (COUMADIN ) 5 MG tablet Take 5 mg by mouth daily.    [provider]     Physical Exam: Vitals:   11/28/23 0300 11/28/23 0301 11/28/23 0303 11/28/23 0306  BP: (!) 80/48 (!) 73/36 (!) 79/56 97/62  Pulse: (!) 38 (!) 46 95 (!) 39  Resp: 17 18 19 19   SpO2: 94% 99% 99% 98%    Physical Exam Vitals and nursing note reviewed.  Constitutional:      Appearance: He is not  ill-appearing.   Cardiovascular:     Rate and Rhythm: Normal rate. Rhythm irregular.  Pulmonary:     Effort: Pulmonary effort is normal. No tachypnea, accessory muscle usage or respiratory distress.     Breath sounds: Normal breath sounds. No stridor. No decreased breath sounds, wheezing, rhonchi or rales.   Musculoskeletal:     Right lower leg: Edema present.     Left lower leg: Edema present.   Skin:    Capillary Refill: Capillary refill takes less than 2 seconds.   Neurological:     Mental Status: He is alert and oriented to person, place, and time.   Psychiatric:        Mood and Affect: Mood normal. Mood is not anxious.        Behavior: Behavior is not agitated.      Labs on Admission: I have personally reviewed following labs and imaging studies  CBC: Recent Labs  Lab 11/27/23 2304  WBC 6.7  HGB 11.3*  HCT 38.0*  MCV 90.7  PLT 189   Basic Metabolic Panel: Recent Labs  Lab 11/27/23 2304  NA 143  K 3.4*  CL 103  CO2 32  GLUCOSE 139*  BUN 11  CREATININE 0.87  CALCIUM  9.3   GFR: CrCl cannot be calculated (Unknown ideal weight.). Liver Function Tests: Recent Labs  Lab 11/27/23 2304  AST 20  ALT 12  ALKPHOS 68  BILITOT 0.8  PROT 6.6  ALBUMIN 3.1*   No results for input(s): LIPASE, AMYLASE in the last 168 hours. No results for input(s): AMMONIA in the last 168 hours. Coagulation Profile: Recent Labs  Lab 11/28/23 0045  INR 1.3*   Cardiac Enzymes: Recent Labs  Lab 11/27/23 2304 11/28/23 0045  TROPONINIHS 9 9   BNP (last 3 results) Recent Labs    10/12/23 1755 11/27/23 2304  BNP 176.2* 267.4*   HbA1C: No results for input(s): HGBA1C in the last 72 hours. CBG: No results for input(s): GLUCAP in the last 168 hours.  Lipid Profile: No results for input(s): CHOL, HDL, LDLCALC, TRIG, CHOLHDL, LDLDIRECT in the last 72 hours. Thyroid  Function Tests: No results for input(s): TSH, T4TOTAL, FREET4, T3FREE,  THYROIDAB in the last 72 hours. Anemia Panel: No results for input(s): VITAMINB12, FOLATE, FERRITIN, TIBC, IRON , RETICCTPCT in the last 72 hours. Urine analysis:    Component Value Date/Time   COLORURINE STRAW (A) 10/12/2023 2224   APPEARANCEUR CLEAR 10/12/2023 2224   LABSPEC 1.008 10/12/2023 2224   PHURINE 6.0 10/12/2023 2224   GLUCOSEU NEGATIVE 10/12/2023 2224   HGBUR NEGATIVE 10/12/2023 2224   BILIRUBINUR NEGATIVE 10/12/2023 2224   BILIRUBINUR negative 08/14/2020 1441   BILIRUBINUR NEG 02/01/2015 0950   KETONESUR NEGATIVE 10/12/2023 2224   PROTEINUR NEGATIVE 10/12/2023 2224   UROBILINOGEN 0.2 08/14/2020 1441   UROBILINOGEN 1.0 07/24/2010 1411   NITRITE NEGATIVE 10/12/2023 2224   LEUKOCYTESUR NEGATIVE 10/12/2023 2224    Radiological Exams on Admission: I have personally reviewed images DG Chest Portable 1 View Result Date: 11/27/2023 CLINICAL DATA:  Shortness of breath. EXAM: PORTABLE CHEST 1 VIEW COMPARISON:  10/12/2023 FINDINGS: Prior median sternotomy with prosthetic aortic valve. Cardiomegaly is grossly stable. Right pleural effusion has increased in size, moderate to large. There may be a small left pleural effusion. Peribronchial thickening suggestive of pulmonary edema. No pneumothorax. IMPRESSION: 1. Increasing right pleural effusion, moderate to large. 2. Peribronchial thickening suggestive of pulmonary edema. 3. Stable cardiomegaly. Electronically Signed   By: Andrea Gasman M.D.   On: 11/27/2023 23:42     EKG: My personal interpretation of EKG shows: Atrial fibrillation rate controlled.    Assessment/Plan: Principal Problem:   Acute on chronic diastolic CHF (congestive heart failure) (HCC) Active Problems:   Bilateral pleural effusion   Essential hypertension   Paroxysmal atrial fibrillation (HCC)   Papillary fibroelastoma of heart   Dementia without behavioral disturbance (HCC)   Non-insulin  dependent type 2 diabetes mellitus (HCC)   History of  CAD (coronary artery disease)   History of COPD   Chronic hypoxic respiratory failure (HCC)   Generalized anxiety disorder   Peripheral neuropathy   Hypokalemia   Acute exacerbation of CHF (congestive heart failure) (HCC)    Assessment and Plan: Acute on chronic diastolic heart failure exacerbation Essential hypertension Right-sided pleural effusion History of cardiac fibroelastoma -Presented emergency department complaining of orthopnea, dyspnea, PND and worsening bilateral lower extremity swelling for last 2 days.  At presentation to ED patient is hemodynamically stable requiring more oxygen  as compared to baseline.  CBC unremarkable.  CMP showed low potassium 3.4 otherwise unremarkable.  Elevated BNP 267.  Troponin level.  EKG showed atrial fibrillation rate controlled. - Chest x-ray showed right pleural effusion moderate to large. -Patient's daughter reported that recently to 3 days ago found to have elevated INR for and patient has not been taking Coumadin  for last 3 days.  Checking pro time INR.  If INR within normal range please consult IR for right-sided thoracentesis. -Echo from 5/40/25 showed diastolic heart failure with preserved EF 60 to 65%, biatrial enlargement and mobile papillary fibroelastoma.   Cardiology recommended repeat echocardiogram in 1 year. -At home patient takes Lasix  20 mg daily - In the ED patient received IV Lasix  40 mg. - Continue IV Lasix  20 mg twice daily, strict I's/O, daily weight, monitor urine output. -Continue Toprol -XL 50 mg daily. - Obtaining echocardiogram - Continue cardiac monitoring. Addendum -Hypotension Patient blood pressure has been dropped to 97/62 and MAP 72. This patient has been admitted for acute CHF exacerbation.  Earlier tonight received Lasix  40 mg IV around 11 PM.  Given blood pressure has been trending down giving albumin 25 g and midodrine 10 mg.  Will avoid IV fluid hydration given it will worsen the CHF again.   Holding further  IV Lasix  and  Toprol -XL. -If blood pressure allows  will start low-dose Lasix  IV 20 mg daily. Bradycardia - Also heart rate has been dropped 38-39.  Holding Toprol -XL.   Paroxysmal atrial fibrillation -Rechecking pro time INR.   -Continue Toprol -XL 50 mg daily and Coumadin  with pharmacy consult.  Hypokalemia -Potassium 3.4 replating with oral KCl 40 mEq.  Mobile papillary fibroelastoma - Previously has been evaluated by cardiology in May 2025 recommended repeat echo in 1 year. -Patient is already on anticoagulation with Coumadin  for the management of atrial fibrillation.  Non-insulin -dependent DM type II -History of non-insulin -dependent type II diet controlled.  Previous A1c 6.1 last checked in 2022.  Will repeat A1c today. Continue heart healthy carb modified diet  History of CAD status post CABG -Rechecking pro time INR.  Continue Coumadin  with pharmacy consult and and Toprol -XL 50 mg daily.  Chronic hypoxic respiratory failure on 2 to 3 L oxygen  baseline History of COPD -Stable.  Patient does not have any wheezing. - Continue Breo Ellipta  once daily and DuoNeb as needed  Peripheral neuropathy -Continue gabapentin  at bedtime  History of dementia -Continue donepezil   Generalized anxiety disorder -Continue Seroquel   Based on the family request checking UA.  DVT prophylaxis:  Coumadin , SCD and TED hose Code Status:  Full Code Diet: Heart healthy carb modified diet with fluid restriction 2 L/day and salt restriction 2 g/day Family Communication:   Family was present at bedside, at the time of interview. Opportunity was given to ask question and all questions were answered satisfactorily.  Disposition Plan: Pending repeat echocardiogram.  Continue to improvement in volume status.  If INR within normal range need to consult IR for right-sided thoracentesis. Consults: Heart failure team Admission status:   Inpatient, Telemetry bed  Severity of Illness: The appropriate  patient status for this patient is INPATIENT. Inpatient status is judged to be reasonable and necessary in order to provide the required intensity of service to ensure the patient's safety. The patient's presenting symptoms, physical exam findings, and initial radiographic and laboratory data in the context of their chronic comorbidities is felt to place them at high risk for further clinical deterioration. Furthermore, it is not anticipated that the patient will be medically stable for discharge from the hospital within 2 midnights of admission.   * I certify that at the point of admission it is my clinical judgment that the patient will require inpatient hospital care spanning beyond 2 midnights from the point of admission due to high intensity of service, high risk for further deterioration and high frequency of surveillance required.DEWAINE    Lucciano Vitali, MD [facility]  How to contact the [facility] Attending or Consulting provider 7A - 7P or covering provider during after hours 7P -7A, for this patient.  Check the care team in [facility] and look for a) attending/consulting [facility] provider listed and b) the [facility] team listed Log into [email or web address] and use [place] universal password to access. If you do not have the password, please contact the hospital operator. Locate the [facility] provider you are looking for under [facility] and page to a number that you can be directly reached. If you still have difficulty reaching the provider, please page the [facility] (Director on Call) for  the Hospitalists listed on amion for assistance.  11/28/2023, 3:11 AM

## 2023-11-28 NOTE — Hospital Course (Addendum)
 [name] was admitted to the hospital with the working diagnosis of heart failure exacerbation.   83 yo male with the past medical history of aortic stenosis, sp AVR 2012, coronary artery disease sp CABG 2012, COPD, heart failure, paroxysmal atrial fibrillation, T2DM, dementia and cardiac fibroelastoma who presented with dyspnea.  Recent hospitalization for heart failure 05/13 to 10/18/23 for heart failure, he was discharged with instructions to take 20 mg furosemide  daily.   Reported 2 days of worsening dyspnea on exertion and lower extremity edema. Increased 02 requirements from 2 L/min per [place] to 5 L/min per [place]. On his initial physical examination his blood pressure was 80/48, HR 46, RR 18 and 02 saturation 94%.  Lungs with no wheezing or rhonchi, heart with S1 and S2 present and irregular with no gallops or rubs, abdomen with no distention and positive lower extremity edema.   Na 143, K 3,4 CL 103 bicarbonate 32 glucose 139 bun 11 cr 0,87  AST 20 ALT 12  BNP 267  High sensitive troponin 9 and 9  Wbc 6,7 hgb 11,3 plt 189  [place] covid 19 negative Influenza negative Rsv negative   Chest radiograph with cardiomegaly with bilateral hilar vascular congestion and right pleural effusion (moderate)   EKG 78 bpm, normal axis, normal intervals, qtc 463, atrial fibrillation rhythm with no significant ST segment or  T wave changes.   Patient placed on IV furosemide   06/30 thoracentesis 2 L removed

## 2023-11-28 NOTE — Plan of Care (Signed)
 INR 1.3.  Goal INR 2-3 in the setting of past medical fibrillation.  Pharmacy has been consulted for Coumadin  management  Right-sided pleural effusion Consulting IR for requesting for right-sided thoracentesis.  Already has been discussed with patient's daughter at the bedside regarding plan for thoracentesis and they are agreeable with the plan.  Patient has dementia unable to give the consent.  Need to receive consent for thoracentesis from patient's daughter.

## 2023-11-28 NOTE — Assessment & Plan Note (Signed)
 Continue quetiapine  Continue with home health services.

## 2023-11-28 NOTE — ED Notes (Signed)
 Nt Called CCMD @1 :07am

## 2023-11-28 NOTE — Assessment & Plan Note (Addendum)
 Renal function with serum cr at 1,0 with K at 3,5 and serum bicarbonate at 36  Na 143 and Mg 1,7   Add 2 g mag sulfate to avoid hypomagnesemia Continue diuresis and follow up renal function and electrolytes in am/

## 2023-11-28 NOTE — Progress Notes (Signed)
 Progress Note   Patient: SHADD DUNSTAN FMW:[id number] DOB: Sep 24, 1940 DOA: 11/27/2023     0 DOS: the patient was seen and examined on 11/28/2023   Brief hospital course: Mr. Deleo was admitted to the hospital with the working diagnosis of heart failure exacerbation.   83 yo male with the past medical history of aortic stenosis, sp AVR 2012, coronary artery disease sp CABG 2012, COPD, heart failure, paroxysmal atrial fibrillation, T2DM, dementia and cardiac fibroelastoma who presented with dyspnea.  Recent hospitalization for heart failure 05/13 to 10/18/23 for heart failure, he was discharged with instructions to take 20 mg furosemide  daily.   Reported 2 days of worsening dyspnea on exertion and lower extremity edema. Increased 02 requirements from 2 L/min per [place] to 5 L/min per [place]. On his initial physical examination his blood pressure was 80/48, HR 46, RR 18 and 02 saturation 94%.  Lungs with no wheezing or rhonchi, heart with S1 and S2 present and irregular with no gallops or rubs, abdomen with no distention and positive lower extremity edema.   Na 143, K 3,4 CL 103 bicarbonate 32 glucose 139 bun 11 cr 0,87  AST 20 ALT 12  BNP 267  High sensitive troponin 9 and 9  Wbc 6,7 hgb 11,3 plt 189  [place] covid 19 negative Influenza negative Rsv negative   Chest radiograph with cardiomegaly with bilateral hilar vascular congestion and right pleural effusion (moderate)   EKG 78 bpm, normal axis, normal intervals, qtc 463, atrial fibrillation rhythm with no significant ST segment or  T wave changes.    Assessment and Plan: * Acute on chronic diastolic CHF (congestive heart failure) (HCC) Echocardiogram with preserved LV systolic function EF 60 to 65%, mild LVH, RV systolic function with moderate reduction, moderate enlarged RV, RVSP 46.7 mmHg, LA and RA with severe dilatation, mild to moderate mitral valve regurgitation, mild to moderate mitral valve stenosis, mild to moderate tricuspid valve  regurgitation, sp aortic valve replacement,   Acute on chronic core pulmonale Pulmonary hypertension  RV failure.   Continue volume overloaded.  Blood pressure has improved.   Plan to continue diuresis with furosemide  60 mg IV bid. Add spironolactone  Hold on afterload reduction due to hypotension.   Acute hypoxemic respiratory failure, due to acute cardiogenic pulmonary edema, moderate right pleural effusion Plan to continue diuresis.  IR consulted for right thoracentesis, check fluid analysis Continue supplemental 02 per [place] to keep 02 saturation 88% or greater.   Essential hypertension Hypotensive on admission, he had one dose of midodrine Will resume diuresis for volume overload.   Paroxysmal atrial fibrillation (HCC) Continue telemetry monitoring  Anticoagulation with warfarin   History of CAD (coronary artery disease) No acute chest pain Continue blood pressure monitoring   Hypokalemia Renal function stable, will continue Kcl to keep K above 4  Continue diuresis and follow up renal function and electrolytes in am.  Add spironolactone   Non-insulin  dependent type 2 diabetes mellitus (HCC) Continue glucose cover and monitoring with insulin  sliding scale.   History of COPD No signs of acute exacerbation He has chronic hypoxemia, with home supplemental 02 per [place]   Dementia without behavioral disturbance (HCC) Continue donepezil  and quetiapine  Patient has high risk for delirium, continue neuro checks per unit protocol      Subjective: Patient with persistent dyspnea and edema, no chest pain. Mild confusion, not much interactive when asking questions   Physical Exam: Vitals:   11/28/23 0745 11/28/23 0748 11/28/23 0945 11/28/23 1045  BP:  124/67 133/68  Pulse: 62  (!) 116 81  Resp: 17  16 17   Temp:  (!) 97.3 F (36.3 C)    TempSrc:  Oral    SpO2: 99%  97% 100%   Neurology awake and alert, deconditioned, responds with yes and no to questions, not much  interactive.  ENT with mild pallor Cardiovascular with S1 and S2 present and regular with no gallops or rubs, positive systolic murmur at the right lower sternal border Positive mild JVD Respiratory with decreased breath sounds at the base, no wheezing or rhonchi  Abdomen no distention, non tender  Data Reviewed:   Family Communication: no family at the bedside   Disposition: Status is: Inpatient Remains inpatient appropriate because: IV diuresis   Planned Discharge Destination: Home    Author: Elidia Toribio [place]ace, MD 11/28/2023 11:04 AM  For on call review [email or web address].

## 2023-11-28 NOTE — Assessment & Plan Note (Signed)
 His glucose remained stable during his hospitalization, on the day of discharge his fasting glucose was 103 mg/dl.

## 2023-11-28 NOTE — Plan of Care (Addendum)
 Hypotension Patient blood pressure has been dropped to 97/62 and MAP 72. This patient has been admitted for acute CHF exacerbation.  Earlier tonight received Lasix  40 mg IV around 11 PM.  Given blood pressure has been trending down giving albumin 25 g and midodrine 10 mg.  Will avoid IV fluid hydration given it will worsen the CHF again.   Holding further IV Lasix  and  Toprol -XL. -If blood pressure allows will start low-dose Lasix  IV 20 mg daily. -Obtain EKG which showed atrial fibrillation rate controlled to 77.  Yumiko Alkins, MD [facility] 11/28/2023, 3:08 AM

## 2023-11-28 NOTE — Assessment & Plan Note (Signed)
 He has remained atrial fibrillation rhythm with rate controlled at 80 bpm range.   Continue with warfarin for anticoagulation her discharge INR is 1.2  Plan to continue warfarin 5 mg daily and follow up INR in 48 hrs.  Target INR 2 to 3.  Unfortunately his copay for [facility] is  high and her daughter would like to continue with warfarin for anticoagulation.  Continue rate control with metoprolol  succinate 25 mg daily.

## 2023-11-28 NOTE — Assessment & Plan Note (Addendum)
 Echocardiogram with preserved LV systolic function EF 60 to 65%, mild LVH, RV systolic function with moderate reduction, moderate enlarged RV, RVSP 46.7 mmHg, LA and RA with severe dilatation, mild to moderate mitral valve regurgitation, mild to moderate mitral valve stenosis, mild to moderate tricuspid valve regurgitation, sp aortic valve replacement,   Acute on chronic core pulmonale Pulmonary hypertension  RV failure.   Patient was placed on IV furosemide  for diuresis, negative fluid balance was achieved, - 4,299 ml, with significant improvement in his symptoms.    Continue with spironolactone and lisinopril Resume furosemide  20 mg daily and increase to 40 mg daily in case of volume overload. Weight gain 2 to 3 lbs in 24 hrs or 5 lbs in 7 days.   Acute hypoxemic respiratory failure, due to acute cardiogenic pulmonary edema, moderate right pleural effusion Sp right thoracentesis 2 L  02 saturation 99 % on 2 L/min per [place]   Follow up as outpatient.

## 2023-11-28 NOTE — Assessment & Plan Note (Signed)
 No acute chest pain Continue blood pressure monitoring

## 2023-11-28 NOTE — Assessment & Plan Note (Signed)
 Continue blood pressure control with lisinopril

## 2023-11-28 NOTE — Progress Notes (Signed)
 PHARMACY - ANTICOAGULATION CONSULT NOTE  Pharmacy Consult for Warfarin Indication: atrial fibrillation  Allergies  Allergen Reactions   Pineapple Concentrate Nausea And Vomiting    Patient Measurements:    Vital Signs: Temp: 97.7 F (36.5 C) (06/29 0315) BP: 113/77 (06/29 0430) Pulse Rate: 64 (06/29 0430)  Labs: Recent Labs    11/27/23 2304 11/28/23 0045  HGB 11.3*  --   HCT 38.0*  --   PLT 189  --   LABPROT  --  17.4*  INR  --  1.3*  CREATININE 0.87  --   TROPONINIHS 9 9    CrCl cannot be calculated (Unknown ideal weight.).   Medical History: Past Medical History:  Diagnosis Date   Arthritis    Atrial fibrillation (HCC)    CAD (coronary artery disease) 2012   [place]-LAD 2 or 3 srents   Complication of anesthesia    loopy after polpy removal dec 2017, lasted several days   COPD (chronic obstructive pulmonary disease) (HCC)    Dementia (HCC)    GERD (gastroesophageal reflux disease)    HEART FAILURE, CONGESTIVE UNSPEC 02/23/2007   Qualifier: Diagnosis of  By: VALMA MD, [facility]     Hyperlipidemia    Hypertension    OSA (obstructive sleep apnea) 11/13/2014   S/P AVR    #25 mm Edwards pericardial valve Bioprosthetic. Dr Lucas.   Situational depression    son passed 11/2013   Sleep apnea    occ uses c pap does not know settings    Stented coronary artery 2013   TOBACCO DEPENDENCE 07/29/2006   Qualifier: History of  By: [place]  MD, Stephanie     Type II diabetes mellitus (HCC)     Medications:  No current facility-administered medications on file prior to encounter.   Current Outpatient Medications on File Prior to Encounter  Medication Sig Dispense Refill   acetaminophen  (TYLENOL ) 500 MG tablet Take 500 mg by mouth in the morning and at bedtime.     Ascorbic Acid  (VITAMIN C  PO) Take 1 tablet by mouth daily.     budesonide -formoterol  (SYMBICORT ) 80-4.5 MCG/ACT inhaler Inhale 2 puffs into the lungs 2 (two) times daily. 3 g 0   donepezil  (ARICEPT ) 10 MG  tablet Take 10 mg by mouth daily.     feeding supplement (ENSURE ENLIVE / ENSURE PLUS) LIQD Take 237 mLs by mouth 2 (two) times daily between meals.     furosemide  (LASIX ) 20 MG tablet TAKE 1 TABLET BY MOUTH DAILY (Patient taking differently: Take 20 mg by mouth daily.) 90 tablet 3   gabapentin  (NEURONTIN ) 100 MG capsule TAKE 1 CAPSULE BY MOUTH AT  BEDTIME (Patient taking differently: Take 100 mg by mouth at bedtime.) 100 capsule 2   metoprolol  succinate (TOPROL -XL) 50 MG 24 hr tablet Take 1 tablet (50 mg total) by mouth daily. Take with or immediately following a meal.     Multiple Vitamin (MULTIVITAMIN WITH MINERALS) TABS tablet Take 1 tablet by mouth daily.     nitroGLYCERIN  (NITROSTAT ) 0.4 MG SL tablet Place 0.4 mg under the tongue every 5 (five) minutes as needed for chest pain. (Patient not taking: Reported on 10/12/2023)     QUEtiapine  (SEROQUEL ) 50 MG tablet Take 50 mg by mouth daily.     warfarin (COUMADIN ) 5 MG tablet Take 5 mg by mouth daily.       Assessment: 83 y.o. male with h/o Afib, INR subtherapeutic, to continue warfarin Goal of Therapy:  INR 2-3 Monitor platelets by anticoagulation protocol:  Yes   Plan:  F/U current home regimen and redose when appropriate Daily INR  Dail Cordella Misty 11/28/2023,5:02 AM

## 2023-11-28 NOTE — Progress Notes (Signed)
 PHARMACY - ANTICOAGULATION CONSULT NOTE  Pharmacy Consult for Warfarin Indication: atrial fibrillation  Allergies  Allergen Reactions   Pineapple Concentrate Nausea And Vomiting    Patient Measurements: Height: 5' 9 (175.3 cm) Weight: 77.7 kg (171 lb 4.8 oz) IBW/kg (Calculated) : 70.7 HEPARIN DW (KG): 77.7  Vital Signs: Temp: 98.2 F (36.8 C) (06/29 1305) Temp Source: Oral (06/29 1305) BP: 122/70 (06/29 1305) Pulse Rate: 64 (06/29 1305)  Labs: Recent Labs    11/27/23 2304 11/28/23 0045 11/28/23 0509  HGB 11.3*  --  10.8*  HCT 38.0*  --  36.2*  PLT 189  --  169  LABPROT  --  17.4*  --   INR  --  1.3*  --   CREATININE 0.87  --  0.81  TROPONINIHS 9 9  --     Estimated Creatinine Clearance: 72.7 mL/min (by C-G formula based on SCr of 0.81 mg/dL).   Medical History: Past Medical History:  Diagnosis Date   Arthritis    Atrial fibrillation (HCC)    CAD (coronary artery disease) 2012   [place]-LAD 2 or 3 srents   Complication of anesthesia    loopy after polpy removal dec 2017, lasted several days   COPD (chronic obstructive pulmonary disease) (HCC)    Dementia (HCC)    GERD (gastroesophageal reflux disease)    HEART FAILURE, CONGESTIVE UNSPEC 02/23/2007   Qualifier: Diagnosis of  By: VALMA MD, [facility]     Hyperlipidemia    Hypertension    OSA (obstructive sleep apnea) 11/13/2014   S/P AVR    #25 mm Edwards pericardial valve Bioprosthetic. Dr Lucas.   Situational depression    son passed 11/2013   Sleep apnea    occ uses c pap does not know settings    Stented coronary artery 2013   TOBACCO DEPENDENCE 07/29/2006   Qualifier: History of  By: [place]  MD, Stephanie     Type II diabetes mellitus (HCC)     Medications:  No current facility-administered medications on file prior to encounter.   Current Outpatient Medications on File Prior to Encounter  Medication Sig Dispense Refill   acetaminophen  (TYLENOL ) 500 MG tablet Take 500 mg by mouth in the morning  and at bedtime.     Ascorbic Acid  (VITAMIN C  PO) Take 1 tablet by mouth daily.     ascorbic acid  (VITAMIN C ) 500 MG tablet Take 500 mg by mouth daily.     budesonide -formoterol  (SYMBICORT ) 80-4.5 MCG/ACT inhaler Inhale 2 puffs into the lungs 2 (two) times daily. 3 g 0   donepezil  (ARICEPT ) 10 MG tablet Take 10 mg by mouth daily.     feeding supplement (ENSURE ENLIVE / ENSURE PLUS) LIQD Take 237 mLs by mouth 2 (two) times daily between meals.     fluticasone -salmeterol (ADVAIR) 250-50 MCG/ACT AEPB Inhale 1 puff into the lungs 2 (two) times daily.     furosemide  (LASIX ) 20 MG tablet TAKE 1 TABLET BY MOUTH DAILY 90 tablet 3   gabapentin  (NEURONTIN ) 100 MG capsule TAKE 1 CAPSULE BY MOUTH AT  BEDTIME 100 capsule 2   metoprolol  succinate (TOPROL -XL) 50 MG 24 hr tablet Take 1 tablet (50 mg total) by mouth daily. Take with or immediately following a meal.     Multiple Vitamin (MULTIVITAMIN WITH MINERALS) TABS tablet Take 1 tablet by mouth daily.     nitroGLYCERIN  (NITROSTAT ) 0.4 MG SL tablet Place 0.4 mg under the tongue every 5 (five) minutes as needed for chest pain. (Patient not taking:  Reported on 10/12/2023)     QUEtiapine  (SEROQUEL ) 50 MG tablet Take 50 mg by mouth daily.     tiZANidine (ZANAFLEX) 2 MG tablet Take 2 mg by mouth at bedtime.     warfarin (COUMADIN ) 5 MG tablet Take 5 mg by mouth daily.       Assessment: 83 y.o. male with h/o Afib, INR subtherapeutic, to continue warfarin -INR= 1.3  Last known warfarin dose: 5mg /d (trying to confirm with family)  Goal of Therapy:  INR 2-3 Monitor platelets by anticoagulation protocol: Yes   Plan:  -Warfarin 5mg  po today -Daily PT/INR  Prentice Poisson, PharmD Clinical Pharmacist **Pharmacist phone directory can now be found on [email or web address] ([email or web address]).  Listed under [facility].

## 2023-11-29 ENCOUNTER — Inpatient Hospital Stay (HOSPITAL_COMMUNITY)

## 2023-11-29 DIAGNOSIS — Z8679 Personal history of other diseases of the circulatory system: Secondary | ICD-10-CM | POA: Diagnosis not present

## 2023-11-29 DIAGNOSIS — I5033 Acute on chronic diastolic (congestive) heart failure: Secondary | ICD-10-CM

## 2023-11-29 DIAGNOSIS — I480 Paroxysmal atrial fibrillation: Secondary | ICD-10-CM | POA: Diagnosis not present

## 2023-11-29 DIAGNOSIS — I10 Essential (primary) hypertension: Secondary | ICD-10-CM | POA: Diagnosis not present

## 2023-11-29 LAB — ECHOCARDIOGRAM LIMITED
AR max vel: 1.99 cm2
AV Area VTI: 1.83 cm2
AV Area mean vel: 1.98 cm2
AV Mean grad: 4 mmHg
AV Peak grad: 7.3 mmHg
Ao pk vel: 1.35 m/s
Area-P 1/2: 2.32 cm2
Calc EF: 60.5 %
Height: 69 in
MV VTI: 1.1 cm2
S' Lateral: 2.8 cm
Single Plane A2C EF: 59.5 %
Single Plane A4C EF: 54.8 %
Weight: 2634.94 [oz_av]

## 2023-11-29 LAB — LACTATE DEHYDROGENASE, PLEURAL OR PERITONEAL FLUID: LD, Fluid: 495 U/L — ABNORMAL HIGH (ref 3–23)

## 2023-11-29 LAB — BASIC METABOLIC PANEL WITH GFR
Anion gap: 11 (ref 5–15)
BUN: 12 mg/dL (ref 8–23)
CO2: 36 mmol/L — ABNORMAL HIGH (ref 22–32)
Calcium: 9.7 mg/dL (ref 8.9–10.3)
Chloride: 96 mmol/L — ABNORMAL LOW (ref 98–111)
Creatinine, Ser: 1 mg/dL (ref 0.61–1.24)
GFR, Estimated: >60 mL/min (ref >60)
Glucose, Bld: 138 mg/dL — ABNORMAL HIGH (ref 70–99)
Potassium: 3.5 mmol/L (ref 3.5–5.1)
Sodium: 143 mmol/L (ref 135–145)

## 2023-11-29 LAB — ALBUMIN, PLEURAL OR PERITONEAL FLUID: Albumin, Fluid: 2.3 g/dL

## 2023-11-29 LAB — MAGNESIUM: Magnesium: 1.7 mg/dL (ref 1.7–2.4)

## 2023-11-29 LAB — PROTEIN, PLEURAL OR PERITONEAL FLUID: Total protein, fluid: 4.1 g/dL

## 2023-11-29 LAB — PROTIME-INR
INR: 1.4 — ABNORMAL HIGH (ref 0.8–1.2)
Prothrombin Time: 17.5 s — ABNORMAL HIGH (ref 11.4–15.2)

## 2023-11-29 MED ORDER — LISINOPRIL 5 MG PO TABS
5.0000 mg | ORAL_TABLET | Freq: Every day | ORAL | Status: DC
  Administered 2023-11-29 – 2023-12-04 (×6): 5 mg via ORAL
  Filled 2023-11-29 (×6): qty 1

## 2023-11-29 MED ORDER — MAGNESIUM SULFATE 2 GM/50ML IV SOLN
2.0000 g | Freq: Once | INTRAVENOUS | Status: AC
  Administered 2023-11-29: 2 g via INTRAVENOUS
  Filled 2023-11-29: qty 50

## 2023-11-29 MED ORDER — PERFLUTREN LIPID MICROSPHERE
1.0000 mL | INTRAVENOUS | Status: AC | PRN
  Administered 2023-11-29: 5 mL via INTRAVENOUS

## 2023-11-29 MED ORDER — LIDOCAINE HCL 1 % IJ SOLN
INTRAMUSCULAR | Status: AC
  Filled 2023-11-29: qty 20

## 2023-11-29 MED ORDER — WARFARIN SODIUM 5 MG PO TABS
5.0000 mg | ORAL_TABLET | Freq: Once | ORAL | Status: AC
  Administered 2023-11-29: 5 mg via ORAL
  Filled 2023-11-29: qty 1

## 2023-11-29 MED ORDER — POTASSIUM CHLORIDE CRYS ER 20 MEQ PO TBCR
40.0000 meq | EXTENDED_RELEASE_TABLET | Freq: Once | ORAL | Status: AC
  Administered 2023-11-29: 40 meq via ORAL
  Filled 2023-11-29: qty 2

## 2023-11-29 MED ORDER — LIDOCAINE HCL 1 % IJ SOLN
20.0000 mL | Freq: Once | INTRAMUSCULAR | Status: AC
  Administered 2023-11-29: 10 mL via INTRADERMAL

## 2023-11-29 NOTE — Progress Notes (Signed)
 PHARMACY - ANTICOAGULATION CONSULT NOTE  Pharmacy Consult for Warfarin Indication: atrial fibrillation  Allergies  Allergen Reactions   Pineapple Concentrate Nausea And Vomiting    Patient Measurements: Height: 5' 9 (175.3 cm) Weight: 74.7 kg (164 lb 10.9 oz) IBW/kg (Calculated) : 70.7 HEPARIN DW (KG): 77.7  Vital Signs: Temp: 97.8 F (36.6 C) (06/30 1125) Temp Source: Oral (06/30 1125) BP: 112/64 (06/30 1125) Pulse Rate: 75 (06/30 1125)  Labs: Recent Labs    11/27/23 2304 11/28/23 0045 11/28/23 0509 11/29/23 0318  HGB 11.3*  --  10.8*  --   HCT 38.0*  --  36.2*  --   PLT 189  --  169  --   LABPROT  --  17.4*  --  17.5*  INR  --  1.3*  --  1.4*  CREATININE 0.87  --  0.81 1.00  TROPONINIHS 9 9  --   --     Estimated Creatinine Clearance: 58.9 mL/min (by C-G formula based on SCr of 1 mg/dL).   Medical History: Past Medical History:  Diagnosis Date   Arthritis    Atrial fibrillation (HCC)    CAD (coronary artery disease) 2012   [place]-LAD 2 or 3 srents   Complication of anesthesia    loopy after polpy removal dec 2017, lasted several days   COPD (chronic obstructive pulmonary disease) (HCC)    Dementia (HCC)    GERD (gastroesophageal reflux disease)    HEART FAILURE, CONGESTIVE UNSPEC 02/23/2007   Qualifier: Diagnosis of  By: VALMA MD, [facility]     Hyperlipidemia    Hypertension    OSA (obstructive sleep apnea) 11/13/2014   S/P AVR    #25 mm Edwards pericardial valve Bioprosthetic. Dr Lucas.   Situational depression    son passed 11/2013   Sleep apnea    occ uses c pap does not know settings    Stented coronary artery 2013   TOBACCO DEPENDENCE 07/29/2006   Qualifier: History of  By: [place]  MD, Stephanie     Type II diabetes mellitus (HCC)     Medications:  No current facility-administered medications on file prior to encounter.   Current Outpatient Medications on File Prior to Encounter  Medication Sig Dispense Refill   acetaminophen  (TYLENOL )  500 MG tablet Take 500 mg by mouth 2 (two) times daily.     ascorbic acid  (VITAMIN C ) 500 MG tablet Take 500 mg by mouth daily.     budesonide -formoterol  (SYMBICORT ) 80-4.5 MCG/ACT inhaler Inhale 2 puffs into the lungs 2 (two) times daily. 3 g 0   feeding supplement (ENSURE ENLIVE / ENSURE PLUS) LIQD Take 237 mLs by mouth 2 (two) times daily between meals.     fluticasone -salmeterol (ADVAIR) 250-50 MCG/ACT AEPB Inhale 1 puff into the lungs 2 (two) times daily.     furosemide  (LASIX ) 20 MG tablet TAKE 1 TABLET BY MOUTH DAILY (Patient taking differently: Take 10 mg by mouth daily.) 90 tablet 3   gabapentin  (NEURONTIN ) 100 MG capsule TAKE 1 CAPSULE BY MOUTH AT  BEDTIME 100 capsule 2   metoprolol  succinate (TOPROL -XL) 50 MG 24 hr tablet Take 1 tablet (50 mg total) by mouth daily. Take with or immediately following a meal. (Patient taking differently: Take 25 mg by mouth daily. Take with or immediately following a meal.)     Multiple Vitamin (MULTIVITAMIN WITH MINERALS) TABS tablet Take 1 tablet by mouth daily.     nitroGLYCERIN  (NITROSTAT ) 0.4 MG SL tablet Place 0.4 mg under the tongue every 5 (  five) minutes as needed for chest pain.     QUEtiapine  (SEROQUEL ) 50 MG tablet Take 50 mg by mouth daily.     tiZANidine (ZANAFLEX) 2 MG tablet Take 2 mg by mouth at bedtime.     warfarin (COUMADIN ) 5 MG tablet Take 5 mg by mouth daily.     donepezil  (ARICEPT ) 10 MG tablet Take 10 mg by mouth daily. (Patient not taking: Reported on 11/28/2023)       Assessment: 83 y.o. male with h/o Afib, INR subtherapeutic, to continue warfarin.  Last known warfarin dose: 5mg /d (trying to confirm with family).  INR today was subtherapeutic at 1.4. Underwent R thoracentesis today with 2L removed. Last CBC yesterday was stable. Oral intake documented at 100%.   Goal of Therapy:  INR 2-3 Monitor platelets by anticoagulation protocol: Yes   Plan:  -Warfarin 5mg  po today -Daily PT/INR  Thank you for allowing pharmacy to  participate in this patient's care,  Suzen Sour, PharmD, BCCCP Clinical Pharmacist  Phone: [phone] 11/29/2023 12:17 PM  Please check AMION for all [facility] phone numbers After 10:00 PM, call Main Pharmacy [phone]

## 2023-11-29 NOTE — Progress Notes (Signed)
 Progress Note   Patient: Matthew Edwards FMW:[id number] DOB: Nov 28, 1940 DOA: 11/27/2023     1 DOS: the patient was seen and examined on 11/29/2023   Brief hospital course: Mr. Kirtz was admitted to the hospital with the working diagnosis of heart failure exacerbation.   83 yo male with the past medical history of aortic stenosis, sp AVR 2012, coronary artery disease sp CABG 2012, COPD, heart failure, paroxysmal atrial fibrillation, T2DM, dementia and cardiac fibroelastoma who presented with dyspnea.  Recent hospitalization for heart failure 05/13 to 10/18/23 for heart failure, he was discharged with instructions to take 20 mg furosemide  daily.   Reported 2 days of worsening dyspnea on exertion and lower extremity edema. Increased 02 requirements from 2 L/min per [place] to 5 L/min per [place]. On his initial physical examination his blood pressure was 80/48, HR 46, RR 18 and 02 saturation 94%.  Lungs with no wheezing or rhonchi, heart with S1 and S2 present and irregular with no gallops or rubs, abdomen with no distention and positive lower extremity edema.   Na 143, K 3,4 CL 103 bicarbonate 32 glucose 139 bun 11 cr 0,87  AST 20 ALT 12  BNP 267  High sensitive troponin 9 and 9  Wbc 6,7 hgb 11,3 plt 189  [place]ars covid 19 negative Influenza negative Rsv negative   Chest radiograph with cardiomegaly with bilateral hilar vascular congestion and right pleural effusion (moderate)   EKG 78 bpm, normal axis, normal intervals, qtc 463, atrial fibrillation rhythm with no significant ST segment or  T wave changes.   Patient placed on IV furosemide   06/30 thoracentesis 2 L removed    Assessment and Plan: * Acute on chronic diastolic CHF (congestive heart failure) (HCC) Echocardiogram with preserved LV systolic function EF 60 to 65%, mild LVH, RV systolic function with moderate reduction, moderate enlarged RV, RVSP 46.7 mmHg, LA and RA with severe dilatation, mild to moderate mitral valve regurgitation, mild  to moderate mitral valve stenosis, mild to moderate tricuspid valve regurgitation, sp aortic valve replacement,   Acute on chronic core pulmonale Pulmonary hypertension  RV failure.   Improved volume status Urine output is 3,850 ml Systolic blood pressure 115 mmHg range   Plan to continue diuresis with furosemide  60 mg IV bid. Continue with spironolactone and add low dose ace inh   Acute hypoxemic respiratory failure, due to acute cardiogenic pulmonary edema, moderate right pleural effusion Sp right thoracentesis 2 L  02 saturation 99 % on 2 L/min per [place]   Essential hypertension Continue diuresis, and blood pressure monitoring, will add low dose lisinopril.   Paroxysmal atrial fibrillation (HCC) Continue telemetry monitoring  Anticoagulation with warfarin   History of CAD (coronary artery disease) No acute chest pain Continue blood pressure monitoring   Hypokalemia Renal function with serum cr at 1,0 with K at 3,5 and serum bicarbonate at 36  Na 143 and Mg 1,7   Add 2 g mag sulfate to avoid hypomagnesemia Continue diuresis and follow up renal function and electrolytes in am/   Non-insulin  dependent type 2 diabetes mellitus (HCC) Glucose has been stable, with fasting glucose 138 mg/dl 857, 763 and 89  Will dc insulin  therapy and check cbg as needed   History of COPD No signs of acute exacerbation He has chronic hypoxemia, with home supplemental 02 per [place]   Dementia without behavioral disturbance (HCC) Continue donepezil  and quetiapine  Patient has high risk for delirium, continue neuro checks per unit protocol  Subjective: Patient with improvement in dyspnea and lower extremity edema, mild to moderate confusion  Physical Exam: Vitals:   11/29/23 0715 11/29/23 0756 11/29/23 0925 11/29/23 1125  BP: (!) 94/53  111/71 112/64  Pulse: 87   75  Resp: (!) 21   17  Temp: 97.7 F (36.5 C)   97.8 F (36.6 C)  TempSrc: Oral   Oral  SpO2: 94% 94%  93%  Weight:       Height:       Neurology awake and alert, positive cognitive impairment, no agitation but confusion, follows commands and responds to simple questions  ENT with mild pallor Cardiovascular with S1 and S2 present and regular positive systolic murmur at the apex No JVD Trace lower extremity edema  Respiratory with mild rales at bases with no wheezing, or rhonchi Abdomen with no distention   Data Reviewed:    Family Communication: no family at the bedside   Disposition: Status is: Inpatient Remains inpatient appropriate because: diuresis   Planned Discharge Destination: Home    Author: Elidia Toribio Furnace, MD 11/29/2023 1:32 PM  For on call review [email or web address].

## 2023-11-29 NOTE — Plan of Care (Signed)

## 2023-11-29 NOTE — Procedures (Signed)
 PROCEDURE SUMMARY:  Successful image-guided right thoracentesis. Yielded 2L of hazy amber fluid. Pt tolerated procedure well. No immediate complications. EBL = trace   Specimen was  sent for labs. CXR ordered.  Please see imaging section of Epic for full dictation.  Roshawnda Pecora H Joey Lierman PA-C 11/29/2023 9:43 AM

## 2023-11-29 NOTE — Progress Notes (Signed)
 Heart Failure Navigator Progress Note  Assessed for Heart & Vascular TOC clinic readiness.  Patient does not meet criteria due to per MD note patient with history of Dementia. .   Navigator will sign off at this time.   Rhae Hammock, BSN, Scientist, clinical (histocompatibility and immunogenetics) Only

## 2023-11-30 ENCOUNTER — Other Ambulatory Visit (HOSPITAL_COMMUNITY): Payer: Self-pay

## 2023-11-30 ENCOUNTER — Telehealth (HOSPITAL_COMMUNITY): Payer: Self-pay | Admitting: Pharmacy Technician

## 2023-11-30 DIAGNOSIS — I5033 Acute on chronic diastolic (congestive) heart failure: Secondary | ICD-10-CM | POA: Diagnosis not present

## 2023-11-30 DIAGNOSIS — I480 Paroxysmal atrial fibrillation: Secondary | ICD-10-CM | POA: Diagnosis not present

## 2023-11-30 DIAGNOSIS — I10 Essential (primary) hypertension: Secondary | ICD-10-CM | POA: Diagnosis not present

## 2023-11-30 DIAGNOSIS — Z8679 Personal history of other diseases of the circulatory system: Secondary | ICD-10-CM | POA: Diagnosis not present

## 2023-11-30 LAB — BASIC METABOLIC PANEL WITH GFR
Anion gap: 9 (ref 5–15)
BUN: 13 mg/dL (ref 8–23)
CO2: 39 mmol/L — ABNORMAL HIGH (ref 22–32)
Calcium: 9.6 mg/dL (ref 8.9–10.3)
Chloride: 92 mmol/L — ABNORMAL LOW (ref 98–111)
Creatinine, Ser: 1.04 mg/dL (ref 0.61–1.24)
GFR, Estimated: >60 mL/min (ref >60)
Glucose, Bld: 103 mg/dL — ABNORMAL HIGH (ref 70–99)
Potassium: 3.6 mmol/L (ref 3.5–5.1)
Sodium: 140 mmol/L (ref 135–145)

## 2023-11-30 LAB — PROTIME-INR
INR: 1.2 (ref 0.8–1.2)
Prothrombin Time: 16.3 s — ABNORMAL HIGH (ref 11.4–15.2)

## 2023-11-30 LAB — MAGNESIUM: Magnesium: 2.2 mg/dL (ref 1.7–2.4)

## 2023-11-30 MED ORDER — WARFARIN - PHARMACIST DOSING INPATIENT: Freq: Every day | Status: DC

## 2023-11-30 MED ORDER — FUROSEMIDE 20 MG PO TABS
20.0000 mg | ORAL_TABLET | Freq: Every day | ORAL | Status: DC
  Administered 2023-11-30 – 2023-12-04 (×5): 20 mg via ORAL
  Filled 2023-11-30 (×5): qty 1

## 2023-11-30 MED ORDER — METOPROLOL SUCCINATE ER 50 MG PO TB24: 25.0000 mg | ORAL_TABLET | Freq: Every day | ORAL | Status: DC

## 2023-11-30 MED ORDER — POTASSIUM CHLORIDE CRYS ER 20 MEQ PO TBCR
40.0000 meq | EXTENDED_RELEASE_TABLET | Freq: Once | ORAL | Status: AC
  Administered 2023-11-30: 40 meq via ORAL
  Filled 2023-11-30: qty 2

## 2023-11-30 MED ORDER — METOPROLOL SUCCINATE ER 25 MG PO TB24
25.0000 mg | ORAL_TABLET | Freq: Every day | ORAL | Status: DC
  Administered 2023-11-30 – 2023-12-03 (×4): 25 mg via ORAL
  Filled 2023-11-30 (×4): qty 1

## 2023-11-30 MED ORDER — WARFARIN SODIUM 5 MG PO TABS: 5.0000 mg | ORAL_TABLET | Freq: Once | ORAL | Status: DC

## 2023-11-30 MED ORDER — LISINOPRIL 5 MG PO TABS
5.0000 mg | ORAL_TABLET | Freq: Every day | ORAL | 0 refills | Status: DC
Start: 2023-12-01 — End: 2024-01-08

## 2023-11-30 MED ORDER — FUROSEMIDE 20 MG PO TABS: 20.0000 mg | ORAL_TABLET | Freq: Every day | ORAL | 0 refills | Status: DC

## 2023-11-30 MED ORDER — WARFARIN SODIUM 5 MG PO TABS
5.0000 mg | ORAL_TABLET | Freq: Once | ORAL | Status: AC
  Administered 2023-11-30: 5 mg via ORAL
  Filled 2023-11-30: qty 1

## 2023-11-30 MED ORDER — SPIRONOLACTONE 25 MG PO TABS: 12.5000 mg | ORAL_TABLET | Freq: Every day | ORAL | 0 refills | Status: DC

## 2023-11-30 NOTE — Evaluation (Signed)
 Occupational Therapy Evaluation Patient Details Name: Matthew Edwards MRN: [id number] DOB: [birth year] Today's Date: 11/30/2023   History of Present Illness   Pt is an 83 y.o. male presenting 6/28 with shortness of breath and BLE edema. CXR showed increased right pleural effusion and moderate to large. Admittes with heart failure exacerbation. S/p R thoracentesis 6/30.  PMH significant for aortic stenosis s/p AVR, CAD s/p CABG 2012, chronic hypoxic respiratory failure 2 L oxygen  at baseline, COPD, HFpEF, PAF, cardiac fibroelastoma, generalized anxiety disorder, DMII, peripheral neuropathy, dementia     Clinical Impressions PTA, pt recently living with daughter who via phone reports he was ambulatory within the home with RW and intermittently without; previously able to dress and groom self with intermittent cues for orientation of clothing. Pt daughter assists with medication management and IADL. Upon eval, pt with generalized weakness, decreased balance, strength, and activity tolerance. Patient will benefit from continued inpatient follow up therapy, <3 hours/day. On phone, daughter reports she is usually with pt but has concerns about his being up moving around alone when she is sleeping or gone. If daughter feels she is able to provide support at home for ambulation and transfers, pt may dc home with daughter with [facility] services.      If plan is discharge home, recommend the following:   A little help with walking and/or transfers;A little help with bathing/dressing/bathroom;A lot of help with bathing/dressing/bathroom;Assistance with cooking/housework;Direct supervision/assist for medications management;Direct supervision/assist for financial management;Assist for transportation;Help with stairs or ramp for entrance     Functional Status Assessment   Patient has had a recent decline in their functional status and demonstrates the ability to make significant improvements in function in a  reasonable and predictable amount of time.     Equipment Recommendations   None recommended by OT     Recommendations for Other Services         Precautions/Restrictions   Precautions Precautions: Fall Restrictions Weight Bearing Restrictions Per Provider Order: No     Mobility Bed Mobility Overal bed mobility: Needs Assistance Bed Mobility: Supine to Sit, Sit to Supine     Supine to sit: Contact guard Sit to supine: Supervision        Transfers Overall transfer level: Needs assistance Equipment used: Rolling walker (2 wheels) Transfers: Sit to/from Stand Sit to Stand: Contact guard assist, Min assist           General transfer comment: Pt either needing min A for rise of CGA with RW stabilization from OT as pt with preference to pull on RW with one hand on RW and one down on bed      Balance Overall balance assessment: Needs assistance Sitting-balance support: Feet supported Sitting balance-Leahy Scale: Fair Sitting balance - Comments: statically   Standing balance support: Bilateral upper extremity supported Standing balance-Leahy Scale: Poor Standing balance comment: reliant on device                           ADL either performed or assessed with clinical judgement   ADL Overall ADL's : Needs assistance/impaired Eating/Feeding: Set up;Bed level Eating/Feeding Details (indicate cue type and reason): finishing lunch on arrival Grooming: Minimal assistance;Standing   Upper Body Bathing: Set up;Sitting   Lower Body Bathing: Minimal assistance;Sit to/from stand   Upper Body Dressing : Set up;Sitting;Supervision/safety Upper Body Dressing Details (indicate cue type and reason): cues for orientation Lower Body Dressing: Moderate assistance   Toilet Transfer: Rolling walker (  2 wheels);Contact guard assist Toilet Transfer Details (indicate cue type and reason): cues for safety         Functional mobility during ADLs: Contact guard  assist;Minimal assistance;Rolling walker (2 wheels)       Vision Patient Visual Report: No change from baseline       Perception         Praxis         Pertinent Vitals/Pain Pain Assessment Pain Assessment: No/denies pain     Extremity/Trunk Assessment Upper Extremity Assessment Upper Extremity Assessment: Generalized weakness (decr shoulder AROM)   Lower Extremity Assessment Lower Extremity Assessment: Defer to PT evaluation   Cervical / Trunk Assessment Cervical / Trunk Assessment:  (cachetic)   Communication Communication Communication: Impaired Factors Affecting Communication: Hearing impaired   Cognition Arousal: Alert Behavior During Therapy: Flat affect Cognition: History of cognitive impairments                               Following commands: Intact Following commands impaired: Follows one step commands with increased time     Cueing  General Comments   Cueing Techniques: Verbal cues  HR and SpO2 stable on 2L supplementary O2 via [place]   Exercises     Shoulder Instructions      Home Living Family/patient expects to be discharged to:: Private residence Living Arrangements: Children (daughter) Available Help at Discharge: Family;Available PRN/intermittently Type of Home: House Home Access: Level entry     Home Layout: One level     Bathroom Shower/Tub: Chief Strategy Officer: Standard     Home Equipment: Agricultural consultant (2 wheels);Shower seat   Additional Comments: information obtained from daughter via phone      Prior Functioning/Environment Prior Level of Function : Needs assist             Mobility Comments: used RW, but occasionally forgets ADLs Comments: Daughter asisst with medication management, meals, IADL, showers    OT Problem List: Decreased strength;Decreased activity tolerance;Impaired balance (sitting and/or standing);Decreased safety awareness;Decreased knowledge of use of DME or  AE;Cardiopulmonary status limiting activity   OT Treatment/Interventions: Self-care/ADL training;Therapeutic exercise;DME and/or AE instruction;Therapeutic activities;Patient/family education;Balance training      OT Goals(Current goals can be found in the care plan section)   Acute Rehab OT Goals OT Goal Formulation: Patient unable to participate in goal setting Time For Goal Achievement: 12/14/23 Potential to Achieve Goals: Good   OT Frequency:  Min 2X/week    Co-evaluation              AM-PAC OT 6 Clicks Daily Activity     Outcome Measure Help from another person eating meals?: A Little Help from another person taking care of personal grooming?: A Little Help from another person toileting, which includes using toliet, bedpan, or urinal?: A Little Help from another person bathing (including washing, rinsing, drying)?: A Lot Help from another person to put on and taking off regular upper body clothing?: A Little Help from another person to put on and taking off regular lower body clothing?: A Lot 6 Click Score: 16   End of Session Equipment Utilized During Treatment: Gait belt;Rolling walker (2 wheels) Nurse Communication: Mobility status  Activity Tolerance: Patient tolerated treatment well Patient left: in bed;with call bell/phone within reach;with bed alarm set  OT Visit Diagnosis: Unsteadiness on feet (R26.81);Muscle weakness (generalized) (M62.81)  Time: 8652-8578 OT Time Calculation (min): 34 min Charges:  OT General Charges $OT Visit: 1 Visit OT Evaluation $OT Eval Low Complexity: 1 Low OT Treatments $Self Care/Home Management : 8-22 mins  Elma JONETTA Lebron FREDERICK, OTR/L [facility] Acute Rehabilitation Office: [phone]   Elma JONETTA Lebron 11/30/2023, 2:40 PM

## 2023-11-30 NOTE — NC FL2 (Signed)
 [place]  MEDICAID FL2 LEVEL OF CARE FORM     IDENTIFICATION  Patient Name: Matthew Edwards Birthdate: 04/23/1941 Sex: male Admission Date (Current Location): 11/27/2023  [facility] and [place] Number:  Producer, television/film/video and Address:  The . [facility], [address] [place], [place] [address]      Provider Number: [id number]  Attending Physician Name and Address:  Noralee Elidia Toribio DEWAINE  Relative Name and Phone Number:       Current Level of Care: Hospital Recommended Level of Care: Skilled Nursing Facility Prior Approval Number:    Date Approved/Denied:   PASRR Number: [id number]  Discharge Plan: SNF    Current Diagnoses: Patient Active Problem List   Diagnosis Date Noted   Acute on chronic diastolic CHF (congestive heart failure) (HCC) 11/28/2023   Bilateral pleural effusion 11/28/2023   History of CAD (coronary artery disease) 11/28/2023   History of COPD 11/28/2023   Chronic hypoxic respiratory failure (HCC) 11/28/2023   Generalized anxiety disorder 11/28/2023   Peripheral neuropathy 11/28/2023   Hypokalemia 11/28/2023   Acute exacerbation of CHF (congestive heart failure) (HCC) 11/28/2023   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   Acute on chronic heart failure with preserved ejection fraction (HCC) 10/14/2023   Abnormal echocardiogram 10/14/2023   History of coronary artery stent placement 10/14/2023   Dementia without behavioral disturbance (HCC) 10/14/2023   Non-insulin  dependent type 2 diabetes mellitus (HCC) 10/14/2023   Anticoagulated on Coumadin  10/14/2023   Chronic health problem 10/12/2023   Acute hypoxemic respiratory failure (HCC) 10/12/2023   Dysuria 08/13/2020   Lower extremity weakness 03/18/2020   Incontinence, feces 03/12/2020   Cerumen impaction 11/23/2019   Weight loss 11/21/2019   Adenomatous rectal polyp with high grade dysplasia s/p TEM resection 05/26/2016 05/26/2016   Spinal stenosis of lumbar region  11/28/2015   OSA (obstructive sleep apnea) 11/13/2014   Pulmonary HTN (HCC) 08/24/2014   Acute congestive heart failure (HCC) 08/02/2014   Cognitive impairment 07/02/2014   S/P AVR 12/29/2012   Paroxysmal atrial fibrillation (HCC)    Type 2 diabetes mellitus with diabetic neuropathy, unspecified (HCC) 11/25/2011   Vitamin D  deficiency 02/21/2010   Hypercalcemia 05/29/2009   Essential hypertension 02/23/2007   Coronary atherosclerosis 12/15/2006   GERD 12/15/2006   COPD (chronic obstructive pulmonary disease) (HCC) 12/10/2006   HLD (hyperlipidemia) 07/29/2006   Alcohol abuse 07/29/2006   Aortic valve disorder 07/29/2006    Orientation RESPIRATION BLADDER Height & Weight     Self  O2 (2L O2 [place]) Continent, External catheter Weight: 164 lb 7.4 oz (74.6 kg) Height:  5' 9 (175.3 cm)  BEHAVIORAL SYMPTOMS/MOOD NEUROLOGICAL BOWEL NUTRITION STATUS      Continent Diet (see dc summary)  AMBULATORY STATUS COMMUNICATION OF NEEDS Skin   Extensive Assist Verbally Normal                       Personal Care Assistance Level of Assistance  Bathing, Feeding, Dressing Bathing Assistance: Limited assistance Feeding assistance: Limited assistance Dressing Assistance: Limited assistance     Functional Limitations Info  Sight, Hearing, Speech Sight Info: Impaired (eyeglasses) Hearing Info: Impaired (Impaired) Speech Info: Adequate    SPECIAL CARE FACTORS FREQUENCY  PT (By licensed PT), OT (By licensed OT)     PT Frequency: 5x week OT Frequency: 5x week            Contractures Contractures Info: Not present    Additional Factors Info  Code Status, Allergies, Psychotropic Code  Status Info: Full Allergies Info: Pineapple Concentrate Psychotropic Info: gabapentin , seroquel          Current Medications (11/30/2023):  This is the current hospital active medication list Current Facility-Administered Medications  Medication Dose Route Frequency Provider Last Rate Last Admin    acetaminophen  (TYLENOL ) tablet 650 mg  650 mg Oral Q6H PRN Sundil, Subrina, MD       Or   acetaminophen  (TYLENOL ) suppository 650 mg  650 mg Rectal Q6H PRN Sundil, Subrina, MD       donepezil  (ARICEPT ) tablet 10 mg  10 mg Oral Daily Sundil, Subrina, MD   10 mg at 11/30/23 9077   feeding supplement (ENSURE ENLIVE / ENSURE PLUS) liquid 237 mL  237 mL Oral BID BM Sundil, Subrina, MD   237 mL at 11/30/23 9076   fluticasone  furoate-vilanterol (BREO ELLIPTA ) 100-25 MCG/ACT 1 puff  1 puff Inhalation Daily Sundil, Subrina, MD   1 puff at 11/30/23 0805   furosemide  (LASIX ) tablet 20 mg  20 mg Oral Daily Arrien, Elidia Sieving, MD       gabapentin  (NEURONTIN ) capsule 100 mg  100 mg Oral QHS Sundil, Subrina, MD   100 mg at 11/29/23 2239   ipratropium-albuterol  (DUONEB) 0.5-2.5 (3) MG/3ML nebulizer solution 3 mL  3 mL Nebulization Q6H PRN Sundil, Subrina, MD   3 mL at 11/28/23 0511   lisinopril (ZESTRIL) tablet 5 mg  5 mg Oral Daily Arrien, Elidia Sieving, MD   5 mg at 11/30/23 9077   melatonin tablet 5 mg  5 mg Oral QHS PRN Shona Terry SAILOR, DO       metoprolol  succinate (TOPROL -XL) 24 hr tablet 25 mg  25 mg Oral Daily Arrien, Mauricio Daniel, MD   25 mg at 11/30/23 1410   ondansetron  (ZOFRAN ) tablet 4 mg  4 mg Oral Q6H PRN Sundil, Subrina, MD       Or   ondansetron  (ZOFRAN ) injection 4 mg  4 mg Intravenous Q6H PRN Sundil, Subrina, MD       QUEtiapine  (SEROQUEL ) tablet 50 mg  50 mg Oral QHS Arrien, Elidia Sieving, MD   50 mg at 11/29/23 2239   sodium chloride  flush (NS) 0.9 % injection 3 mL  3 mL Intravenous Q12H Sundil, Subrina, MD   3 mL at 11/30/23 9076   sodium chloride  flush (NS) 0.9 % injection 3 mL  3 mL Intravenous Q12H Sundil, Subrina, MD   3 mL at 11/30/23 9076   sodium chloride  flush (NS) 0.9 % injection 3 mL  3 mL Intravenous PRN Sundil, Subrina, MD       spironolactone (ALDACTONE) tablet 12.5 mg  12.5 mg Oral Daily Arrien, Elidia Sieving, MD   12.5 mg at 11/30/23 9077   warfarin (COUMADIN )  tablet 5 mg  5 mg Oral ONCE-1600 Arrien, Mauricio Daniel, MD       Warfarin - Pharmacist Dosing Inpatient   Does not apply [id number] Arrien, Mauricio Daniel, MD         Discharge Medications: Please see discharge summary for a list of discharge medications.  Relevant Imaging Results:  Relevant Lab Results:   Additional Information SSN [id number]  Luise JAYSON Pan, LCSWA

## 2023-11-30 NOTE — Progress Notes (Signed)
 PHARMACY - ANTICOAGULATION CONSULT NOTE  Pharmacy Consult for Warfarin Indication: atrial fibrillation  Allergies  Allergen Reactions   Pineapple Concentrate Nausea And Vomiting    Patient Measurements: Height: 5' 9 (175.3 cm) Weight: 74.6 kg (164 lb 7.4 oz) IBW/kg (Calculated) : 70.7 HEPARIN DW (KG): 77.7  Vital Signs: Temp: 98.4 F (36.9 C) (07/01 0719) Temp Source: Oral (07/01 0719) BP: 108/50 (07/01 0719) Pulse Rate: 93 (07/01 0719)  Labs: Recent Labs    11/27/23 2304 11/28/23 0045 11/28/23 0509 11/29/23 0318 11/30/23 0242  HGB 11.3*  --  10.8*  --   --   HCT 38.0*  --  36.2*  --   --   PLT 189  --  169  --   --   LABPROT  --  17.4*  --  17.5* 16.3*  INR  --  1.3*  --  1.4* 1.2  CREATININE 0.87  --  0.81 1.00 1.04  TROPONINIHS 9 9  --   --   --     Estimated Creatinine Clearance: 56.7 mL/min (by C-G formula based on SCr of 1.04 mg/dL).   Medical History: Past Medical History:  Diagnosis Date   Arthritis    Atrial fibrillation (HCC)    CAD (coronary artery disease) 2012   [place]-LAD 2 or 3 srents   Complication of anesthesia    loopy after polpy removal dec 2017, lasted several days   COPD (chronic obstructive pulmonary disease) (HCC)    Dementia (HCC)    GERD (gastroesophageal reflux disease)    HEART FAILURE, CONGESTIVE UNSPEC 02/23/2007   Qualifier: Diagnosis of  By: VALMA MD, [facility]     Hyperlipidemia    Hypertension    OSA (obstructive sleep apnea) 11/13/2014   S/P AVR    #25 mm Edwards pericardial valve Bioprosthetic. Dr Lucas.   Situational depression    son passed 11/2013   Sleep apnea    occ uses c pap does not know settings    Stented coronary artery 2013   TOBACCO DEPENDENCE 07/29/2006   Qualifier: History of  By: [place]  MD, Stephanie     Type II diabetes mellitus (HCC)     Medications:  No current facility-administered medications on file prior to encounter.   Current Outpatient Medications on File Prior to Encounter   Medication Sig Dispense Refill   acetaminophen  (TYLENOL ) 500 MG tablet Take 500 mg by mouth 2 (two) times daily.     ascorbic acid  (VITAMIN C ) 500 MG tablet Take 500 mg by mouth daily.     budesonide -formoterol  (SYMBICORT ) 80-4.5 MCG/ACT inhaler Inhale 2 puffs into the lungs 2 (two) times daily. 3 g 0   feeding supplement (ENSURE ENLIVE / ENSURE PLUS) LIQD Take 237 mLs by mouth 2 (two) times daily between meals.     fluticasone -salmeterol (ADVAIR) 250-50 MCG/ACT AEPB Inhale 1 puff into the lungs 2 (two) times daily.     furosemide  (LASIX ) 20 MG tablet TAKE 1 TABLET BY MOUTH DAILY (Patient taking differently: Take 10 mg by mouth daily.) 90 tablet 3   gabapentin  (NEURONTIN ) 100 MG capsule TAKE 1 CAPSULE BY MOUTH AT  BEDTIME 100 capsule 2   metoprolol  succinate (TOPROL -XL) 50 MG 24 hr tablet Take 1 tablet (50 mg total) by mouth daily. Take with or immediately following a meal. (Patient taking differently: Take 25 mg by mouth daily. Take with or immediately following a meal.)     Multiple Vitamin (MULTIVITAMIN WITH MINERALS) TABS tablet Take 1 tablet by mouth daily.  nitroGLYCERIN  (NITROSTAT ) 0.4 MG SL tablet Place 0.4 mg under the tongue every 5 (five) minutes as needed for chest pain.     QUEtiapine  (SEROQUEL ) 50 MG tablet Take 50 mg by mouth daily.     tiZANidine (ZANAFLEX) 2 MG tablet Take 2 mg by mouth at bedtime.     warfarin (COUMADIN ) 5 MG tablet Take 5 mg by mouth daily.     donepezil  (ARICEPT ) 10 MG tablet Take 10 mg by mouth daily. (Patient not taking: Reported on 11/28/2023)       Assessment: 83 y.o. male with h/o Afib, INR subtherapeutic, to continue warfarin.  Last known warfarin dose: 5mg /d (trying to confirm with family).  INR today was subtherapeutic at 1.2 after 2 doses of warfarin. On 6/30, underwent R thoracentesis with 2L removed. Last CBC 6/29 was stable. Oral intake documented at 100%. Discussed bridge therapy, but provider deferred as the patient may discharge later  today.  Goal of Therapy:  INR 2-3 Monitor platelets by anticoagulation protocol: Yes   Plan:  -Warfarin 5mg  po today -Daily PT/INR  Thank you for allowing pharmacy to participate in this patient's care,  Koren Or, PharmD Clinical Pharmacist 11/30/2023 9:30 AM Please check AMION for all [facility] Pharmacy numbers

## 2023-11-30 NOTE — Plan of Care (Signed)
   Problem: Education: Goal: Knowledge of General Education information will improve Description: Including pain rating scale, medication(s)/side effects and non-pharmacologic comfort measures Outcome: Progressing   Problem: Clinical Measurements: Goal: Will remain free from infection Outcome: Progressing

## 2023-11-30 NOTE — Evaluation (Signed)
 Physical Therapy Evaluation Patient Details Name: Matthew Edwards MRN: [id number] DOB: 10/08/1940 Today's Date: 11/30/2023  History of Present Illness  Pt is an 83 y.o. male presenting 6/28 with shortness of breath and BLE edema. CXR showed increased right pleural effusion and moderate to large. Admittes with heart failure exacerbation. S/p R thoracentesis 6/30.  PMH significant for aortic stenosis s/p AVR, CAD s/p CABG 2012, chronic hypoxic respiratory failure 2 L oxygen  at baseline, COPD, HFpEF, PAF, cardiac fibroelastoma, generalized anxiety disorder, DMII, peripheral neuropathy, dementia  Clinical Impression  Pt presents to PT with deficits in functional mobility, gait, balance, strength, power, endurance. Pt demonstrates increased time for all aspects of mobility and has poor awareness of endurance deficits, often stating he is not sure how far he can walk and then experiencing an episode of knee buckling prior to reaching desired destination, presumable due to fatigue. Due to pt's impaired awareness and lack of endurance/strength he remains at an increased risk for falls. Patient will benefit from continued inpatient follow up therapy, <3 hours/day.        If plan is discharge home, recommend the following: A little help with walking and/or transfers;A lot of help with bathing/dressing/bathroom;Assistance with cooking/housework;Direct supervision/assist for medications management;Direct supervision/assist for financial management;Assist for transportation;Supervision due to cognitive status;Help with stairs or ramp for entrance   Can travel by private vehicle   Yes    Equipment Recommendations None recommended by PT  Recommendations for Other Services       Functional Status Assessment Patient has had a recent decline in their functional status and demonstrates the ability to make significant improvements in function in a reasonable and predictable amount of time.     Precautions /  Restrictions Precautions Precautions: Fall Restrictions Weight Bearing Restrictions Per Provider Order: No      Mobility  Bed Mobility Overal bed mobility: Needs Assistance Bed Mobility: Supine to Sit, Sit to Supine     Supine to sit: Contact guard Sit to supine: Contact guard assist        Transfers Overall transfer level: Needs assistance Equipment used: Rolling walker (2 wheels) Transfers: Sit to/from Stand Sit to Stand: Contact guard assist                Ambulation/Gait Ambulation/Gait assistance: Min assist Gait Distance (Feet): 70 Feet Assistive device: Rolling walker (2 wheels) Gait Pattern/deviations: Step-through pattern, Trunk flexed, Knees buckling Gait velocity: reduced Gait velocity interpretation: <1.8 ft/sec, indicate of risk for recurrent falls   General Gait Details: pt with instance of knee buckling when turning to return to bed, requires minA to correct from PT  Stairs            Wheelchair Mobility     Tilt Bed    Modified Rankin (Stroke Patients Only)       Balance Overall balance assessment: Needs assistance Sitting-balance support: No upper extremity supported, Feet supported Sitting balance-Leahy Scale: Good     Standing balance support: Bilateral upper extremity supported, Reliant on assistive device for balance Standing balance-Leahy Scale: Poor                               Pertinent Vitals/Pain Pain Assessment Pain Assessment: No/denies pain    Home Living Family/patient expects to be discharged to:: Private residence Living Arrangements: Children (daughter) Available Help at Discharge: Family;Available PRN/intermittently Type of Home: House Home Access: Level entry       Home  Layout: One level Home Equipment: Agricultural consultant (2 wheels);Shower seat Additional Comments: information obtained from daughter via phone    Prior Function Prior Level of Function : Needs assist              Mobility Comments: used RW, but occasionally forgets ADLs Comments: Daughter asisst with medication management, meals, IADL, showers     Extremity/Trunk Assessment   Upper Extremity Assessment Upper Extremity Assessment: Generalized weakness    Lower Extremity Assessment Lower Extremity Assessment: Generalized weakness    Cervical / Trunk Assessment Cervical / Trunk Assessment: Kyphotic  Communication   Communication Communication: Impaired Factors Affecting Communication: Hearing impaired    Cognition Arousal: Alert Behavior During Therapy: Flat affect   PT - Cognitive impairments: Memory, Awareness, Problem solving, Safety/Judgement                       PT - Cognition Comments: very poor awareness of endurance deficits Following commands: Intact       Cueing Cueing Techniques: Verbal cues     General Comments General comments (skin integrity, edema, etc.): pt on 2L [place], VSS    Exercises     Assessment/Plan    PT Assessment Patient needs continued PT services  PT Problem List Decreased strength;Decreased activity tolerance;Decreased balance;Decreased mobility;Decreased knowledge of use of DME;Decreased safety awareness;Decreased knowledge of precautions       PT Treatment Interventions DME instruction;Gait training;Functional mobility training;Therapeutic activities;Therapeutic exercise;Balance training;Stair training;Neuromuscular re-education;Patient/family education    PT Goals (Current goals can be found in the Care Plan section)  Acute Rehab PT Goals Patient Stated Goal: to stop falling PT Goal Formulation: With family Time For Goal Achievement: 12/14/23 Potential to Achieve Goals: Fair    Frequency Min 2X/week     Co-evaluation               AM-PAC PT 6 Clicks Mobility  Outcome Measure Help needed turning from your back to your side while in a flat bed without using bedrails?: A Little Help needed moving from lying on your  back to sitting on the side of a flat bed without using bedrails?: A Little Help needed moving to and from a bed to a chair (including a wheelchair)?: A Little Help needed standing up from a chair using your arms (e.g., wheelchair or bedside chair)?: A Little Help needed to walk in hospital room?: A Little Help needed climbing 3-5 steps with a railing? : Total 6 Click Score: 16    End of Session Equipment Utilized During Treatment: Gait belt;Oxygen  Activity Tolerance: Patient tolerated treatment well Patient left: in bed;with call bell/phone within reach;with bed alarm set Nurse Communication: Mobility status PT Visit Diagnosis: Other abnormalities of gait and mobility (R26.89);Muscle weakness (generalized) (M62.81)    Time: 8562-8546 PT Time Calculation (min) (ACUTE ONLY): 16 min   Charges:   PT Evaluation $PT Eval Low Complexity: 1 Low   PT General Charges $$ ACUTE PT VISIT: 1 Visit         Bernardino JINNY Ruth, PT, DPT Acute Rehabilitation Office [phone]   Bernardino JINNY Ruth 11/30/2023, 3:11 PM

## 2023-11-30 NOTE — Discharge Summary (Signed)
 Physician Discharge Summary   Patient: Matthew Edwards MRN: [id number] DOB: May 17, 1941  Admit date:     11/27/2023  Discharge date: 11/30/23  Discharge Physician: Elidia Sieving Anay Walter   PCP: [name], MD   Recommendations at discharge:    Patient placed on ace inh and spironolactone with good toleration. Resume furosemide  20 mg daily and increase to 40 mg daily in case of volume overload, weight gain 2 to 3 lbs in 24 hrs or 5 lbs in 7 days.  Continue warfarin 5 mg daily, INR check on 12/02/23, target INR 2 to 3.  Follow up renal function and electrolytes in 7 days Follow up with Dr Maree in 7 to 10 days  I spoke with patient's daughter over the phone, we talked in detail about patient's condition, plan of care and prognosis and all questions were addressed.   Discharge Diagnoses: Principal Problem:   Acute on chronic diastolic CHF (congestive heart failure) (HCC) Active Problems:   Essential hypertension   Paroxysmal atrial fibrillation (HCC)   History of CAD (coronary artery disease)   Hypokalemia   Non-insulin  dependent type 2 diabetes mellitus (HCC)   History of COPD   Dementia without behavioral disturbance (HCC)  Resolved Problems:   * No resolved hospital problems. [facility] Course: [name] was admitted to the hospital with the working diagnosis of heart failure exacerbation.   83 yo male with the past medical history of aortic stenosis, sp AVR 2012, (tissue valve) coronary artery disease sp CABG 2012, COPD, heart failure, paroxysmal atrial fibrillation, T2DM, dementia and cardiac fibroelastoma who presented with dyspnea.  Recent hospitalization for heart failure 05/13 to 10/18/23 for heart failure, he was discharged with instructions to take 20 mg furosemide  daily.  At home his home health agency reduced to 10 mg daily of furosemide .  Reported 2 days of worsening dyspnea on exertion and lower extremity edema. Increased 02 requirements from 2 L/min per [place] to  5 L/min per [place]. On his initial physical examination his blood pressure was 80/48, HR 46, RR 18 and 02 saturation 94%.  Lungs with no wheezing or rhonchi, heart with S1 and S2 present and irregular with no gallops or rubs, abdomen with no distention and positive lower extremity edema.   Na 143, K 3,4 CL 103 bicarbonate 32 glucose 139 bun 11 cr 0,87  AST 20 ALT 12  BNP 267  High sensitive troponin 9 and 9  Wbc 6,7 hgb 11,3 plt 189  Sars covid 19 negative Influenza negative Rsv negative   Chest radiograph with cardiomegaly with bilateral hilar vascular congestion and right pleural effusion (moderate)   EKG 78 bpm, normal axis, normal intervals, qtc 463, atrial fibrillation rhythm with no significant ST segment or  T wave changes.   Patient placed on IV furosemide   06/30 thoracentesis 2 L removed  07/01 improved volume status, plan to resume oral furosemide  and follow up as outpatient.    Assessment and Plan: * Acute on chronic diastolic CHF (congestive heart failure) (HCC) Echocardiogram with preserved LV systolic function EF 60 to 65%, mild LVH, RV systolic function with moderate reduction, moderate enlarged RV, RVSP 46.7 mmHg, LA and RA with severe dilatation, mild to moderate mitral valve regurgitation, mild to moderate mitral valve stenosis, mild to moderate tricuspid valve regurgitation, sp aortic valve replacement,   Acute on chronic core pulmonale Pulmonary hypertension  RV failure.   Patient was placed on IV furosemide  for diuresis, negative fluid balance was achieved, -  4,299 ml, with significant improvement in his symptoms.    Continue with spironolactone and lisinopril Resume furosemide  20 mg daily and increase to 40 mg daily in case of volume overload. Weight gain 2 to 3 lbs in 24 hrs or 5 lbs in 7 days.   Acute hypoxemic respiratory failure, due to acute cardiogenic pulmonary edema, moderate right pleural effusion Sp right thoracentesis 2 L  02 saturation 99 % on 2  L/min per [place]   Follow up as outpatient.   Essential hypertension Continue blood pressure control with lisinopril.  Paroxysmal atrial fibrillation (HCC) He has remained atrial fibrillation rhythm with rate controlled at 80 bpm range.   Continue with warfarin for anticoagulation her discharge INR is 1.2  Plan to continue warfarin 5 mg daily and follow up INR in 48 hrs.  Target INR 2 to 3.  Unfortunately his copay for [facility] is  high and her daughter would like to continue with warfarin for anticoagulation.  Continue rate control with metoprolol  succinate 25 mg daily.   History of CAD (coronary artery disease) No acute chest pain Continue blood pressure monitoring   Hypokalemia At the time of discharge his renal function had a serum cr of 1.0 with K at 3,6 and serum bicarbonate at 39  Na 140 and Mg 2.2   Plan to continue diuresis with furosemide  and spironolactone Follow up renal function and electrolytes in 7 days as outpatient.   Non-insulin  dependent type 2 diabetes mellitus (HCC) His glucose remained stable during his hospitalization, on the day of discharge his fasting glucose was 103 mg/dl.   History of COPD No signs of acute exacerbation He has chronic hypoxemia, with home supplemental 02 per    Dementia without behavioral disturbance (HCC) Continue quetiapine  Continue with home health services.       Consultants: none  Procedures performed: none   Disposition: Home Diet recommendation:  Cardiac diet DISCHARGE MEDICATION: Allergies as of 11/30/2023       Reactions   Pineapple Concentrate Nausea And Vomiting        Medication List     STOP taking these medications    budesonide -formoterol  80-4.5 MCG/ACT inhaler Commonly known as: Symbicort    donepezil  10 MG tablet Commonly known as: ARICEPT        TAKE these medications    acetaminophen  500 MG tablet Commonly known as: TYLENOL  Take 500 mg by mouth 2 (two) times daily.   ascorbic acid  500 MG  tablet Commonly known as: VITAMIN C  Take 500 mg by mouth daily.   feeding supplement Liqd Take 237 mLs by mouth 2 (two) times daily between meals.   fluticasone -salmeterol 250-50 MCG/ACT Aepb Commonly known as: ADVAIR Inhale 1 puff into the lungs 2 (two) times daily.   furosemide  20 MG tablet Commonly known as: LASIX  Take 1 tablet (20 mg total) by mouth daily. What changed: how much to take   gabapentin  100 MG capsule Commonly known as: NEURONTIN  TAKE 1 CAPSULE BY MOUTH AT  BEDTIME   lisinopril 5 MG tablet Commonly known as: ZESTRIL Take 1 tablet (5 mg total) by mouth daily. Start taking on: December 01, 2023   metoprolol  succinate 50 MG 24 hr tablet Commonly known as: TOPROL -XL Take 0.5 tablets (25 mg total) by mouth daily. Take with or immediately following a meal.   multivitamin with minerals Tabs tablet Take 1 tablet by mouth daily.   nitroGLYCERIN  0.4 MG SL tablet Commonly known as: NITROSTAT  Place 0.4 mg under the tongue every 5 (five) minutes  as needed for chest pain.   QUEtiapine  50 MG tablet Commonly known as: SEROQUEL  Take 50 mg by mouth daily.   spironolactone 25 MG tablet Commonly known as: ALDACTONE Take 0.5 tablets (12.5 mg total) by mouth daily. Start taking on: December 01, 2023   tiZANidine 2 MG tablet Commonly known as: ZANAFLEX Take 2 mg by mouth at bedtime.   warfarin 5 MG tablet Commonly known as: COUMADIN  Take 5 mg by mouth daily.        Discharge Exam: Filed Weights   11/28/23 1305 11/29/23 0442 11/30/23 0416  Weight: 77.7 kg 74.7 kg 74.6 kg   BP 111/60 (BP Location: Left Arm)   Pulse 79   Temp 98.7 F (37.1 C) (Oral)   Resp 18   Ht 5' 9 (1.753 m)   Wt 74.6 kg   SpO2 97%   BMI 24.29 kg/m   Patient is feeling better, dyspnea has improved, no PND or orthopnea.   Neurology awake and alert, positive cognitive impairment with mild confusion, no agitation  ENT with no pallor or icterus Cardiovascular with S1 and S2 present  irregularly irregular with no gallops, rubs or murmurs Respiratory with no rales or wheezing, no rhonchi Abdomen with no distention  No lower extremity edema   Condition at discharge: stable  The results of significant diagnostics from this hospitalization (including imaging, microbiology, ancillary and laboratory) are listed below for reference.   Imaging Studies: ECHOCARDIOGRAM LIMITED Result Date: 11/29/2023    ECHOCARDIOGRAM LIMITED REPORT   Patient Name:   ELISAH PARMER Date of Exam: 11/29/2023 Medical Rec #:  [id number]       Height:       69.0 in Accession #:    [id number]      Weight:       164.7 lb Date of Birth:  04-30-1941       BSA:          1.902 m Patient Age:    82 years        BP:           112/64 mmHg Patient Gender: M               HR:           69 bpm. Exam Location:  Inpatient Procedure: 2D Echo, Cardiac Doppler, Color Doppler and Intracardiac            Opacification Agent (Both Spectral and Color Flow Doppler were            utilized during procedure). Indications:    CHF  History:        Patient has prior history of Echocardiogram examinations, most                 recent 10/13/2023. Aortic Valve Disease and Mitral Valve Disease,                 Arrythmias:Atrial Fibrillation; Risk Factors:Hypertension,                 Diabetes and Dyslipidemia.                 Aortic Valve: 25 mm Edwards pericardial valve is present in the                 aortic position. Procedure Date: 2012.  Sonographer:    Therisa Crouch Referring Phys: [id number] SUBRINA SUNDIL IMPRESSIONS  1. Left ventricular ejection fraction, by estimation, is 55 to 60%. The left ventricle  has normal function. The left ventricle has no regional wall motion abnormalities. Left ventricular diastolic parameters are indeterminate.  2. Right ventricular systolic function is mildly reduced. The right ventricular size is mildly enlarged.  3. Left atrial size was severely dilated.  4. Right atrial size was severely dilated.  5. The  mitral valve is degenerative. Mild mitral valve regurgitation. The mean mitral valve gradient is 5.0 mmHg. Severe mitral annular calcification.  6. The aortic valve has been repaired/replaced. There is a 25 mm Edwards pericardial valve present in the aortic position. Procedure Date: 2012. Aortic valve mean gradient measures 4.0 mmHg. Aortic valve Vmax measures 1.35 m/s.  7. There is a small echodensity in the LVOT seen on prior studies. 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  8. The inferior vena cava is dilated in size with <50% respiratory variability, suggesting right atrial pressure of 15 mmHg. FINDINGS  Left Ventricle: Left ventricular ejection fraction, by estimation, is 55 to 60%. The left ventricle has normal function. The left ventricle has no regional wall motion abnormalities. There is no left ventricular hypertrophy. Left ventricular diastolic parameters are indeterminate. Left ventricular diastolic function could not be evaluated due to mitral valve replacement. Right Ventricle: The right ventricular size is mildly enlarged. No increase in right ventricular wall thickness. Right ventricular systolic function is mildly reduced. Left Atrium: Left atrial size was severely dilated. Right Atrium: Right atrial size was severely dilated. Mitral Valve: The mitral valve is degenerative in appearance. Severe mitral annular calcification. Mild mitral valve regurgitation. MV peak gradient, 13.0 mmHg. The mean mitral valve gradient is 5.0 mmHg. Tricuspid Valve: The tricuspid valve is normal in structure. Tricuspid valve regurgitation is mild. Aortic Valve: The aortic valve has been repaired/replaced. Aortic valve mean gradient measures 4.0 mmHg. Aortic valve peak gradient measures 7.3 mmHg. Aortic valve area, by VTI measures 1.83 cm. There is a 25 mm Edwards pericardial valve present in the aortic position. Procedure Date: 2012. Venous: The inferior vena cava is dilated in size with less than 50% respiratory  variability, suggesting right atrial pressure of 15 mmHg. LEFT VENTRICLE PLAX 2D LVIDd:         3.60 cm LVIDs:         2.80 cm LV PW:         0.90 cm LV IVS:        0.70 cm LVOT diam:     2.00 cm LV SV:         49 LV SV Index:   26 LVOT Area:     3.14 cm  LV Volumes (MOD) LV vol d, MOD A2C: 119.0 ml LV vol d, MOD A4C: 74.6 ml LV vol s, MOD A2C: 48.2 ml LV vol s, MOD A4C: 33.7 ml LV SV MOD A2C:     70.8 ml LV SV MOD A4C:     74.6 ml LV SV MOD BP:      61.6 ml RIGHT VENTRICLE            IVC RV Basal diam:  4.90 cm    IVC diam: 2.90 cm RV S prime:     6.20 cm/s TAPSE (M-mode): 1.8 cm LEFT ATRIUM              Index        RIGHT ATRIUM           Index LA diam:        5.30 cm  2.79 cm/m   RA Area:     25.30  cm LA Vol (A2C):   104.0 ml 54.67 ml/m  RA Volume:   81.20 ml  42.69 ml/m LA Vol (A4C):   124.0 ml 65.19 ml/m LA Biplane Vol: 120.0 ml 63.08 ml/m  AORTIC VALVE AV Area (Vmax):    1.99 cm AV Area (Vmean):   1.98 cm AV Area (VTI):     1.83 cm AV Vmax:           135.00 cm/s AV Vmean:          89.800 cm/s AV VTI:            0.270 m AV Peak Grad:      7.3 mmHg AV Mean Grad:      4.0 mmHg LVOT Vmax:         85.40 cm/s LVOT Vmean:        56.600 cm/s LVOT VTI:          0.157 m LVOT/AV VTI ratio: 0.58  AORTA Ao Root diam: 2.40 cm Ao Asc diam:  3.80 cm MITRAL VALVE MV Area (PHT): 2.32 cm    SHUNTS MV Area VTI:   1.10 cm    Systemic VTI:  0.16 m MV Peak grad:  13.0 mmHg   Systemic Diam: 2.00 cm MV Mean grad:  5.0 mmHg MV Vmax:       1.80 m/s MV Vmean:      107.0 cm/s Aditya Sabharwal Electronically signed by Ria Commander Signature Date/Time: 11/29/2023/5:01:00 PM    Final    IR THORACENTESIS ASP PLEURAL SPACE W/IMG GUIDE Result Date: 11/29/2023 INDICATION: 83 year old male with history of heart failure presents with shortness of breath. Previous imaging showed enlarging right pleural effusion. Request for therapeutic and diagnostic thoracentesis. EXAM: ULTRASOUND GUIDED RIGHT THORACENTESIS MEDICATIONS: 5 mL 1%  lidocaine  COMPLICATIONS: None immediate. PROCEDURE: An ultrasound guided thoracentesis was thoroughly discussed with the patient's daughter and questions answered. The benefits, risks, alternatives and complications were also discussed. The patient understands and wishes to proceed with the procedure. Written consent was obtained. Ultrasound was performed to localize and mark an adequate pocket of fluid in the right chest. The area was then prepped and draped in the normal sterile fashion. 1% Lidocaine  was used for local anesthesia. Under ultrasound guidance a 6 Fr Safe-T-Centesis catheter was introduced. Thoracentesis was performed. The catheter was removed and a dressing applied. FINDINGS: A total of approximately 2 L of hazy amber fluid was removed. Samples were sent to the laboratory as requested by the clinical team. Post procedure chest X-ray reviewed, negative for pneumothorax. IMPRESSION: Successful ultrasound guided right thoracentesis yielding 2 L of pleural fluid. Performed by: Aimee Han, PA-C Electronically Signed   By: Marcey Moan M.D.   On: 11/29/2023 10:42   DG Chest 1 View Result Date: 11/29/2023 CLINICAL DATA:  Right thoracentesis. EXAM: CHEST  1 VIEW COMPARISON:  11/27/2023. FINDINGS: Trachea is midline. Heart is enlarged, stable. Aortic valve replacement. Small to moderate residual right pleural effusion after right thoracentesis. No definite pneumothorax. Improved right basilar collapse/consolidation. IMPRESSION: 1. Small to moderate residual right pleural effusion status post right thoracentesis. No pneumothorax. 2. Improving right basilar collapse/consolidation. Electronically Signed   By: Newell Eke M.D.   On: 11/29/2023 09:43   DG Chest Portable 1 View Result Date: 11/27/2023 CLINICAL DATA:  Shortness of breath. EXAM: PORTABLE CHEST 1 VIEW COMPARISON:  10/12/2023 FINDINGS: Prior median sternotomy with prosthetic aortic valve. Cardiomegaly is grossly stable. Right pleural  effusion has increased in size, moderate to large. There may  be a small left pleural effusion. Peribronchial thickening suggestive of pulmonary edema. No pneumothorax. IMPRESSION: 1. Increasing right pleural effusion, moderate to large. 2. Peribronchial thickening suggestive of pulmonary edema. 3. Stable cardiomegaly. Electronically Signed   By: Andrea Gasman M.D.   On: 11/27/2023 23:42    Microbiology: Results for orders placed or performed during the hospital encounter of 11/27/23  Resp panel by RT-PCR (RSV, Flu A&B, Covid) Anterior Nasal Swab     Status: None   Collection Time: 11/27/23 11:24 PM   Specimen: Anterior Nasal Swab  Result Value Ref Range Status   SARS Coronavirus 2 by RT PCR NEGATIVE NEGATIVE Final   Influenza A by PCR NEGATIVE NEGATIVE Final   Influenza B by PCR NEGATIVE NEGATIVE Final    Comment: (NOTE) The Xpert Xpress SARS-CoV-2/FLU/RSV plus assay is intended as an aid in the diagnosis of influenza from Nasopharyngeal swab specimens and should not be used as a sole basis for treatment. Nasal washings and aspirates are unacceptable for Xpert Xpress SARS-CoV-2/FLU/RSV testing.  Fact Sheet for Patients: BloggerCourse.com  Fact Sheet for Healthcare Providers: SeriousBroker.it  This test is not yet approved or cleared by the [place]  FDA and has been authorized for detection and/or diagnosis of SARS-CoV-2 by FDA under an Emergency Use Authorization (EUA). This EUA will remain in effect (meaning this test can be used) for the duration of the COVID-19 declaration under Section 564(b)(1) of the Act, 21 U.S.C. section 360bbb-3(b)(1), unless the authorization is terminated or revoked.     Resp Syncytial Virus by PCR NEGATIVE NEGATIVE Final    Comment: (NOTE) Fact Sheet for Patients: BloggerCourse.com  Fact Sheet for Healthcare Providers: SeriousBroker.it  This  test is not yet approved or cleared by the [place]  FDA and has been authorized for detection and/or diagnosis of SARS-CoV-2 by FDA under an Emergency Use Authorization (EUA). This EUA will remain in effect (meaning this test can be used) for the duration of the COVID-19 declaration under Section 564(b)(1) of the Act, 21 U.S.C. section 360bbb-3(b)(1), unless the authorization is terminated or revoked.  Performed at [facility], [address] [place], [place] [address]   Body fluid culture w Gram Stain     Status: None (Preliminary result)   Collection Time: 11/29/23  9:36 AM   Specimen: Lung, Right; Pleural Fluid  Result Value Ref Range Status   Specimen Description PLEURAL  Final   Special Requests RIGHT LUNG  Final   Gram Stain   Final    RARE WBC PRESENT,BOTH PMN AND MONONUCLEAR NO ORGANISMS SEEN    Culture   Final    NO GROWTH < 24 HOURS Performed at [facility], [address] [place], [place] [address]    Report Status PENDING  Incomplete    Labs: CBC: Recent Labs  Lab 11/27/23 2304 11/28/23 0509  WBC 6.7 5.1  HGB 11.3* 10.8*  HCT 38.0* 36.2*  MCV 90.7 92.3  PLT 189 169   Basic Metabolic Panel: Recent Labs  Lab 11/27/23 2304 11/28/23 0509 11/29/23 0318 11/30/23 0242  NA 143 142 143 140  K 3.4* 3.6 3.5 3.6  CL 103 102 96* 92*  CO2 32 31 36* 39*  GLUCOSE 139* 106* 138* 103*  BUN 11 9 12 13   CREATININE 0.87 0.81 1.00 1.04  CALCIUM  9.3 8.8* 9.7 9.6  MG  --   --  1.7 2.2   Liver Function Tests: Recent Labs  Lab 11/27/23 2304 11/28/23 0509  AST 20  19  ALT 12 12  ALKPHOS 68 70  BILITOT 0.8 1.1  PROT 6.6 6.5  ALBUMIN 3.1* 3.3*   CBG: No results for input(s): GLUCAP in the last 168 hours.  Discharge time spent: greater than 30 minutes.  Signed: Elidia Toribio Furnace, MD [facility] 11/30/2023

## 2023-11-30 NOTE — TOC Progression Note (Signed)
 Transition of Care [facility]) - Progression Note    Patient Details  Name: Matthew Edwards MRN: [id number] Date of Birth: 23-Jan-1941  [facility] CM/SW Contact  Luise JAYSON Pan, [place] Phone Number: 11/30/2023, 3:23 PM  Clinical Narrative:   CSW spoke with [name], pts daughter, and she would like STR for patient. [name] stated she would like patient to go to [facility]. CSW to submit referral to [facility]. Izdyjb gave CSW permission to submit referrals to other facilities just in case [facility] cannot accept. She would like CSW to email her the list of accepting facilities to [email or web address], in the event [facility] cannot accept.   TOC will continue to follow.    Expected Discharge Plan: Skilled Nursing Facility Barriers to Discharge: Continued Medical Work up  Expected Discharge Plan and Services In-house Referral: Clinical Social Work     Living arrangements for the past 2 months: Single Family Home Expected Discharge Date: 11/30/23                                     Social Determinants of Health (SDOH) Interventions SDOH Screenings   Food Insecurity: Patient Unable To Answer (11/28/2023)  Housing: Patient Unable To Answer (11/28/2023)  Transportation Needs: Patient Unable To Answer (11/28/2023)  Utilities: Patient Unable To Answer (11/28/2023)  Depression (PHQ2-9): Medium Risk (11/19/2020)  Financial Resource Strain: Low Risk  (12/09/2022)   Received from [facility]  Social Connections: Patient Unable To Answer (11/28/2023)  Stress: No Stress Concern Present (03/22/2022)   Received from [facility]  Tobacco Use: Medium Risk (11/28/2023)    Readmission Risk Interventions    11/30/2023    1:16 PM  Readmission Risk Prevention Plan  Transportation Screening Complete  PCP or Specialist Appt within 3-5 Days Complete  HRI or Home Care Consult Complete  Palliative Care Screening Not Applicable  Medication Review (RN Care Manager) Complete

## 2023-11-30 NOTE — TOC CM/SW Note (Addendum)
 Transition of Care [facility]) - Inpatient Brief Assessment   Patient Details  Name: Matthew Edwards MRN: [id number] Date of Birth: 09/23/1940  [facility]) CM/SW Contact:    Waddell Barnie Rama, RN Phone Number: 11/30/2023, 1:17 PM   Clinical Narrative: Patient gives this NCM permission to speak with daughter, From home with daughter, has PCP and insurance on file, states has  [facility] services in place  with [facility], but would like to change agency's , she states he has been falling at home and she would like for him to be evaluated by physical therapy,  she thinks he needs to go to a SNF,  NCM informed MD of this information.  MD ordered pt/ot eval , he has a walker at home, he also has a w/chair that he does not use, he has home oxygen  with [employer] 2 liters.  .  States family member will transport them home at [employer] and family is support system, states gets medications from [facility] on [address].  Pta self ambulatory with walker.  Per PT eval rec SNF,  CSW aware.  She will inform daughter.  [facility] has him on hold just in case.     Transition of Care Asessment: Insurance and Status: Insurance coverage has been reviewed Patient has primary care physician: Yes Home environment has been reviewed: home with daughter Prior level of function:: ambulatory with walker Prior/Current Home Services: Current home services (walker, has w/chair but does not use) Social Drivers of Health Review: SDOH reviewed no interventions necessary Readmission risk has been reviewed: Yes Transition of care needs: transition of care needs identified, TOC will continue to follow

## 2023-11-30 NOTE — Telephone Encounter (Signed)
 Patient Product/process development scientist completed.    The patient is insured through [facility]. Patient has Medicare and is not eligible for a copay card, but may be able to apply for patient assistance or Medicare RX Payment Plan (Patient Must reach out to their plan, if eligible for payment plan), if available.    Ran test claim for Eliquis  5 mg and the current 30 day co-pay is $241.77 due to a deductible.  Will be $47.00 once deductible is met.  Ran test claim for Xarelto  20 mg and the current 30 day co-pay is $241.77 due to a deductible.  Will be $47.00 once deductible is met.  This test claim was processed through [facility]- copay amounts may vary at other pharmacies due to pharmacy/plan contracts, or as the patient moves through the different stages of their insurance plan.     Reyes Sharps, CPHT Pharmacy Technician III Certified Patient Advocate [facility] Patient Advocate Team Direct Number: [phone]  Fax: [phone]

## 2023-12-01 LAB — PROTIME-INR
INR: 1.4 — ABNORMAL HIGH (ref 0.8–1.2)
Prothrombin Time: 18.2 s — ABNORMAL HIGH (ref 11.4–15.2)

## 2023-12-01 MED ORDER — WARFARIN SODIUM 7.5 MG PO TABS
7.5000 mg | ORAL_TABLET | Freq: Once | ORAL | Status: AC
  Administered 2023-12-01: 7.5 mg via ORAL
  Filled 2023-12-01: qty 1

## 2023-12-01 NOTE — Progress Notes (Signed)
 Mobility Specialist Progress Note:    12/01/23 1600  Mobility  Activity Ambulated with assistance in room  Level of Assistance Minimal assist, patient does 75% or more  Assistive Device Front wheel walker  Distance Ambulated (ft) 70 ft  Mobility Referral Yes  Mobility visit 1 Mobility  Mobility Specialist Start Time (ACUTE ONLY) 1345  Mobility Specialist Stop Time (ACUTE ONLY) 1407  Mobility Specialist Time Calculation (min) (ACUTE ONLY) 22 min   Pt agreeable to session. Somewhat hard of hearing but still followed commands. Pt was able to stand for 5 mins using the bathroom before having to sit down from getting tired. Pt was able to get up again to ambulate CGA before returning back to the room. Left pt in bed no c/o comfortably w/ all needs met.   Venetia Keel Mobility Specialist Please Neurosurgeon or Rehab Office at [phone]

## 2023-12-01 NOTE — TOC Progression Note (Addendum)
 Transition of Care [facility]) - Progression Note    Patient Details  Name: Matthew Edwards MRN: [id number] Date of Birth: 1940-08-29  [facility]) CM/SW Contact  Luise JAYSON Pan, [place] Phone Number: 12/01/2023, 11:43 AM  Clinical Narrative:   Per [facility], patient is in copay days ($209.50/day) and to return to [facility], patient/family would need to pay 2 weeks upfront. CSW explained this information to [facility]. [name] stated patient should not be in copay days as patient was only at [facility] for 7 days. Per facility, patient was at their facility from 10/18/2023 - 11/06/2023. CSW explained that patient is in his wellness days with insurance meaning patient would need to stay out of a facility for at least 60-90 days before insurance will fully cover rehab again. CSW explained to [facility] that if patient will have to pay copay and is unable, HHPT is an option to consider.  [name] inquired about the copay for the other accepting facilities:  - Per [employer], insurance set the copay cost and it could range from approximately $203-214/day. - Per [facility], [facility] copays for them are typically about $214/day. Would require 30days payment upfront.  - Per Jon w/ [facility], will get back to CSW regarding copays.   CSW updated treatment team. CSW to update [name] once all information is gathered.   4:43 PM CSW left VM for [name] regarding patient being in copay days and to discuss patient disposition. CSW will follow up at a later time.   TOC will continue to follow.    Expected Discharge Plan: Skilled Nursing Facility Barriers to Discharge: Continued Medical Work up  Expected Discharge Plan and Services In-house Referral: Clinical Social Work     Living arrangements for the past 2 months: Single Family Home Expected Discharge Date: 11/30/23                                     Social Determinants of Health (SDOH) Interventions SDOH Screenings    Food Insecurity: Patient Unable To Answer (11/28/2023)  Housing: Patient Unable To Answer (11/28/2023)  Transportation Needs: Patient Unable To Answer (11/28/2023)  Utilities: Patient Unable To Answer (11/28/2023)  Depression (PHQ2-9): Medium Risk (11/19/2020)  Financial Resource Strain: Low Risk  (12/09/2022)   Received from [facility]  Social Connections: Patient Unable To Answer (11/28/2023)  Stress: No Stress Concern Present (03/22/2022)   Received from [facility]  Tobacco Use: Medium Risk (11/28/2023)    Readmission Risk Interventions    11/30/2023    1:16 PM  Readmission Risk Prevention Plan  Transportation Screening Complete  PCP or Specialist Appt within 3-5 Days Complete  HRI or Home Care Consult Complete  Palliative Care Screening Not Applicable  Medication Review (RN Care Manager) Complete

## 2023-12-01 NOTE — Plan of Care (Signed)
  Problem: Education: Goal: Knowledge of General Education information will improve Description: Including pain rating scale, medication(s)/side effects and non-pharmacologic comfort measures Outcome: Progressing   Problem: Health Behavior/Discharge Planning: Goal: Ability to manage health-related needs will improve Outcome: Progressing   Problem: Clinical Measurements: Goal: Ability to maintain clinical measurements within normal limits will improve Outcome: Progressing Goal: Will remain free from infection Outcome: Progressing Goal: Diagnostic test results will improve Outcome: Progressing Goal: Respiratory complications will improve Outcome: Progressing Goal: Cardiovascular complication will be avoided Outcome: Progressing   Problem: Activity: Goal: Risk for activity intolerance will decrease Outcome: Progressing   Problem: Nutrition: Goal: Adequate nutrition will be maintained Outcome: Progressing   Problem: Coping: Goal: Level of anxiety will decrease Outcome: Progressing   Problem: Elimination: Goal: Will not experience complications related to bowel motility Outcome: Progressing Goal: Will not experience complications related to urinary retention Outcome: Progressing   Problem: Pain Managment: Goal: General experience of comfort will improve and/or be controlled Outcome: Progressing   Problem: Skin Integrity: Goal: Risk for impaired skin integrity will decrease Outcome: Progressing   Problem: Education: Goal: Ability to demonstrate management of disease process will improve Outcome: Progressing Goal: Ability to verbalize understanding of medication therapies will improve Outcome: Progressing Goal: Individualized Educational Video(s) Outcome: Progressing   Problem: Activity: Goal: Capacity to carry out activities will improve Outcome: Progressing   Problem: Cardiac: Goal: Ability to achieve and maintain adequate cardiopulmonary perfusion will  improve Outcome: Progressing  No acute changes this shift, patient has periods of bradycardia per monitor/teley. MD Sigurd is aware, pt's rate rebounds to WNL, is asymptomatic. BM x2. Fair oral intake, refused SCDs was educated by this Clinical research associate purpose.

## 2023-12-01 NOTE — Progress Notes (Signed)
 Physical Therapy Treatment Patient Details Name: NETANEL YANNUZZI MRN: [id number] DOB: 1940/10/20 Today's Date: 12/01/2023   History of Present Illness Pt is an 83 y.o. male presenting 6/28 with shortness of breath and BLE edema. CXR showed increased right pleural effusion and moderate to large. Admittes with heart failure exacerbation. S/p R thoracentesis 6/30.  PMH significant for aortic stenosis s/p AVR, CAD s/p CABG 2012, chronic hypoxic respiratory failure 2 L oxygen  at baseline, COPD, HFpEF, PAF, cardiac fibroelastoma, generalized anxiety disorder, DMII, peripheral neuropathy, dementia    PT Comments  Planned to work on strengthening and mobility today. Practice repeat sit to stand for strengthening. Pt incontinent of stool and after standing for pericare pt fatigued and legs tremulous so did not amb again (amb earlier with mobility). Patient will benefit from continued inpatient follow up therapy, <3 hours/day. Appears this might not be a possibility and if that is the case will need assist at home with max [facility] services.    If plan is discharge home, recommend the following: A little help with walking and/or transfers;A lot of help with bathing/dressing/bathroom;Assistance with cooking/housework;Direct supervision/assist for medications management;Direct supervision/assist for financial management;Assist for transportation;Supervision due to cognitive status;Help with stairs or ramp for entrance   Can travel by private vehicle     Yes  Equipment Recommendations  None recommended by PT    Recommendations for Other Services       Precautions / Restrictions Precautions Precautions: Fall;Other (comment) Precaution/Restrictions Comments: watch for knee buckling Restrictions Weight Bearing Restrictions Per Provider Order: No     Mobility  Bed Mobility Overal bed mobility: Needs Assistance Bed Mobility: Supine to Sit, Sit to Supine     Supine to sit: Contact guard Sit to supine:  Contact guard assist        Transfers Overall transfer level: Needs assistance Equipment used: Rolling walker (2 wheels) Transfers: Sit to/from Stand Sit to Stand: Min assist           General transfer comment: Assist to power up and for balance    Ambulation/Gait               General Gait Details: pt incontinent of stool and legs fatigued and tremulous after standing for pericare   Stairs             Wheelchair Mobility     Tilt Bed    Modified Rankin (Stroke Patients Only)       Balance Overall balance assessment: Needs assistance Sitting-balance support: No upper extremity supported, Feet supported Sitting balance-Leahy Scale: Good     Standing balance support: Bilateral upper extremity supported, Reliant on assistive device for balance Standing balance-Leahy Scale: Poor Standing balance comment: walker and min assist                            Communication Communication Communication: Impaired Factors Affecting Communication: Hearing impaired  Cognition Arousal: Alert Behavior During Therapy: Flat affect   PT - Cognitive impairments: Memory, Awareness, Problem solving, Safety/Judgement, History of cognitive impairments                         Following commands: Intact      Cueing Cueing Techniques: Verbal cues  Exercises Other Exercises Other Exercises: repeat sit to stand x 3 with CGA    General Comments General comments (skin integrity, edema, etc.): VSS on RA      Pertinent Vitals/Pain  Home Living                          Prior Function            PT Goals (current goals can now be found in the care plan section) Acute Rehab PT Goals Patient Stated Goal: to stop falling Progress towards PT goals: Not progressing toward goals - comment (weak and incontinent of stool)    Frequency    Min 2X/week      PT Plan      Co-evaluation              AM-PAC PT 6 Clicks  Mobility   Outcome Measure  Help needed turning from your back to your side while in a flat bed without using bedrails?: A Little Help needed moving from lying on your back to sitting on the side of a flat bed without using bedrails?: A Little Help needed moving to and from a bed to a chair (including a wheelchair)?: A Little Help needed standing up from a chair using your arms (e.g., wheelchair or bedside chair)?: A Little Help needed to walk in hospital room?: A Little Help needed climbing 3-5 steps with a railing? : Total 6 Click Score: 16    End of Session Equipment Utilized During Treatment: Gait belt Activity Tolerance: Patient limited by fatigue Patient left: in bed;with call bell/phone within reach;with bed alarm set Nurse Communication: Mobility status PT Visit Diagnosis: Other abnormalities of gait and mobility (R26.89);Muscle weakness (generalized) (M62.81)     Time: 8479-8457 PT Time Calculation (min) (ACUTE ONLY): 22 min  Charges:    $Therapeutic Activity: 8-22 mins PT General Charges $$ ACUTE PT VISIT: 1 Visit                     [facility] PT Acute Rehabilitation Services Office [phone]    [place] ORN [facility] 12/01/2023, 4:40 PM

## 2023-12-01 NOTE — Progress Notes (Signed)
 PHARMACY - ANTICOAGULATION CONSULT NOTE  Pharmacy Consult for Warfarin Indication: atrial fibrillation  Allergies  Allergen Reactions   Pineapple Concentrate Nausea And Vomiting    Patient Measurements: Height: 5' 9 (175.3 cm) Weight: 73 kg (160 lb 15 oz) IBW/kg (Calculated) : 70.7 HEPARIN DW (KG): 77.7  Vital Signs: Temp: 98.4 F (36.9 C) (07/02 0720) Temp Source: Oral (07/02 0720) BP: 109/47 (07/02 0720) Pulse Rate: 79 (07/02 0720)  Labs: Recent Labs    11/29/23 0318 11/30/23 0242 12/01/23 0222  LABPROT 17.5* 16.3* 18.2*  INR 1.4* 1.2 1.4*  CREATININE 1.00 1.04  --     Estimated Creatinine Clearance: 56.7 mL/min (by C-G formula based on SCr of 1.04 mg/dL).   Medical History: Past Medical History:  Diagnosis Date   Arthritis    Atrial fibrillation (HCC)    CAD (coronary artery disease) 2012   Lima-LAD 2 or 3 srents   Complication of anesthesia    loopy after polpy removal dec 2017, lasted several days   COPD (chronic obstructive pulmonary disease) (HCC)    Dementia (HCC)    GERD (gastroesophageal reflux disease)    HEART FAILURE, CONGESTIVE UNSPEC 02/23/2007   Qualifier: Diagnosis of  By: VALMA MD, [facility]     Hyperlipidemia    Hypertension    OSA (obstructive sleep apnea) 11/13/2014   S/P AVR    #25 mm Edwards pericardial valve Bioprosthetic. Dr Lucas.   Situational depression    son passed 11/2013   Sleep apnea    occ uses c pap does not know settings    Stented coronary artery 2013   TOBACCO DEPENDENCE 07/29/2006   Qualifier: History of  By: [place]  MD, Stephanie     Type II diabetes mellitus (HCC)     Medications:  No current facility-administered medications on file prior to encounter.   Current Outpatient Medications on File Prior to Encounter  Medication Sig Dispense Refill   acetaminophen  (TYLENOL ) 500 MG tablet Take 500 mg by mouth 2 (two) times daily.     ascorbic acid  (VITAMIN C ) 500 MG tablet Take 500 mg by mouth daily.      budesonide -formoterol  (SYMBICORT ) 80-4.5 MCG/ACT inhaler Inhale 2 puffs into the lungs 2 (two) times daily. 3 g 0   feeding supplement (ENSURE ENLIVE / ENSURE PLUS) LIQD Take 237 mLs by mouth 2 (two) times daily between meals.     fluticasone -salmeterol (ADVAIR) 250-50 MCG/ACT AEPB Inhale 1 puff into the lungs 2 (two) times daily.     gabapentin  (NEURONTIN ) 100 MG capsule TAKE 1 CAPSULE BY MOUTH AT  BEDTIME 100 capsule 2   Multiple Vitamin (MULTIVITAMIN WITH MINERALS) TABS tablet Take 1 tablet by mouth daily.     nitroGLYCERIN  (NITROSTAT ) 0.4 MG SL tablet Place 0.4 mg under the tongue every 5 (five) minutes as needed for chest pain.     QUEtiapine  (SEROQUEL ) 50 MG tablet Take 50 mg by mouth daily.     tiZANidine (ZANAFLEX) 2 MG tablet Take 2 mg by mouth at bedtime.     warfarin (COUMADIN ) 5 MG tablet Take 5 mg by mouth daily.     donepezil  (ARICEPT ) 10 MG tablet Take 10 mg by mouth daily. (Patient not taking: Reported on 11/28/2023)       Assessment: 83 y.o. male with h/o Afib, INR subtherapeutic, to continue warfarin.  Last known warfarin dose: 5mg /d (trying to confirm with family).  INR today was subtherapeutic at 1.2 after 2 doses of warfarin. On 6/30, underwent R thoracentesis with 2L  removed. Last CBC 6/29 was stable. Oral intake documented at 100%. Discussed bridge therapy, but provider deferred. The patient was expected to discharge yesterday with home health, however it appears he is pending SNF placement. INR remains unchanged after 3 warfarin doses. Will increase dose today.  Goal of Therapy:  INR 2-3 Monitor platelets by anticoagulation protocol: Yes   Plan:  -Warfarin 7.5 mg po today -Daily PT/INR  Thank you for allowing pharmacy to participate in this patient's care,  Koren Or, PharmD Clinical Pharmacist 12/01/2023 10:41 AM Please check AMION for all [facility] Pharmacy numbers

## 2023-12-01 NOTE — Progress Notes (Signed)
 Pt seen and examined, no changes from discharge summary completed by Dr. Noralee yesterday -TOC following, plan to attempt SNF placement today  Sigurd Pac, MD

## 2023-12-02 LAB — BODY FLUID CULTURE W GRAM STAIN: Culture: NO GROWTH

## 2023-12-02 LAB — PROTIME-INR
INR: 1.6 — ABNORMAL HIGH (ref 0.8–1.2)
Prothrombin Time: 19.9 s — ABNORMAL HIGH (ref 11.4–15.2)

## 2023-12-02 LAB — CBC
HCT: 37 % — ABNORMAL LOW (ref 39.0–52.0)
Hemoglobin: 11.2 g/dL — ABNORMAL LOW (ref 13.0–17.0)
MCH: 26.3 pg (ref 26.0–34.0)
MCHC: 30.3 g/dL (ref 30.0–36.0)
MCV: 86.9 fL (ref 80.0–100.0)
Platelets: 180 10*3/uL (ref 150–400)
RBC: 4.26 MIL/uL (ref 4.22–5.81)
RDW: 16.5 % — ABNORMAL HIGH (ref 11.5–15.5)
WBC: 7.4 10*3/uL (ref 4.0–10.5)
nRBC: 0 % (ref 0.0–0.2)

## 2023-12-02 MED ORDER — WARFARIN SODIUM 7.5 MG PO TABS
7.5000 mg | ORAL_TABLET | Freq: Once | ORAL | Status: AC
  Administered 2023-12-02: 7.5 mg via ORAL
  Filled 2023-12-02: qty 1

## 2023-12-02 MED ORDER — LOPERAMIDE HCL 2 MG PO CAPS
2.0000 mg | ORAL_CAPSULE | Freq: Once | ORAL | Status: AC
  Administered 2023-12-02: 2 mg via ORAL
  Filled 2023-12-02: qty 1

## 2023-12-02 NOTE — Progress Notes (Addendum)
 Mobility Specialist Progress Note:    12/02/23 1105  Mobility  Activity Ambulated with assistance in hallway  Level of Assistance Minimal assist, patient does 75% or more  Assistive Device Front wheel walker  Distance Ambulated (ft) 100 ft  Activity Response Tolerated fair;RN notified (Pt had stool incontinence during ambulation.)  Mobility Referral Yes  Mobility visit 1 Mobility  Mobility Specialist Start Time (ACUTE ONLY) 1105  Mobility Specialist Stop Time (ACUTE ONLY) 1145  Mobility Specialist Time Calculation (min) (ACUTE ONLY) 40 min   Pt agreeable to session. Was able to stand and ambulate w/ less assist. Pt had stool incontinence during ambulation w/ inability to control. Pt was cleaned by mobility and NT ambulating to bathroom. Pt left in bed cleaned and all needs met.  Venetia Keel Mobility Specialist Please Neurosurgeon or Rehab Office at [phone]

## 2023-12-02 NOTE — Plan of Care (Signed)
  Problem: Clinical Measurements: Goal: Will remain free from infection Outcome: Progressing Goal: Respiratory complications will improve Outcome: Progressing Goal: Cardiovascular complication will be avoided Outcome: Progressing   Problem: Coping: Goal: Level of anxiety will decrease Outcome: Progressing   Problem: Elimination: Goal: Will not experience complications related to bowel motility Outcome: Progressing   Problem: Safety: Goal: Ability to remain free from injury will improve Outcome: Progressing   Problem: Cardiac: Goal: Ability to achieve and maintain adequate cardiopulmonary perfusion will improve Outcome: Progressing

## 2023-12-02 NOTE — Progress Notes (Signed)
 PHARMACY - ANTICOAGULATION CONSULT NOTE  Pharmacy Consult for Warfarin Indication: atrial fibrillation  Allergies  Allergen Reactions   Pineapple Concentrate Nausea And Vomiting    Patient Measurements: Height: 5' 9 (175.3 cm) Weight: 72.4 kg (159 lb 9.8 oz) IBW/kg (Calculated) : 70.7 HEPARIN DW (KG): 77.7  Vital Signs: Temp: 98.3 F (36.8 C) (07/03 0738) Temp Source: Oral (07/03 0738) BP: 104/53 (07/03 0738) Pulse Rate: 75 (07/03 0738)  Labs: Recent Labs    11/30/23 0242 12/01/23 0222 12/02/23 0219  HGB  --   --  11.2*  HCT  --   --  37.0*  PLT  --   --  180  LABPROT 16.3* 18.2* 19.9*  INR 1.2 1.4* 1.6*  CREATININE 1.04  --   --     Estimated Creatinine Clearance: 56.7 mL/min (by C-G formula based on SCr of 1.04 mg/dL).   Medical History: Past Medical History:  Diagnosis Date   Arthritis    Atrial fibrillation (HCC)    CAD (coronary artery disease) 2012   Lima-LAD 2 or 3 srents   Complication of anesthesia    loopy after polpy removal dec 2017, lasted several days   COPD (chronic obstructive pulmonary disease) (HCC)    Dementia (HCC)    GERD (gastroesophageal reflux disease)    HEART FAILURE, CONGESTIVE UNSPEC 02/23/2007   Qualifier: Diagnosis of  By: VALMA MD, [facility]     Hyperlipidemia    Hypertension    OSA (obstructive sleep apnea) 11/13/2014   S/P AVR    #25 mm Edwards pericardial valve Bioprosthetic. Dr Lucas.   Situational depression    son passed 11/2013   Sleep apnea    occ uses c pap does not know settings    Stented coronary artery 2013   TOBACCO DEPENDENCE 07/29/2006   Qualifier: History of  By: Flint  MD, Stephanie     Type II diabetes mellitus (HCC)     Medications:  No current facility-administered medications on file prior to encounter.   Current Outpatient Medications on File Prior to Encounter  Medication Sig Dispense Refill   acetaminophen  (TYLENOL ) 500 MG tablet Take 500 mg by mouth 2 (two) times daily.     ascorbic  acid (VITAMIN C ) 500 MG tablet Take 500 mg by mouth daily.     budesonide -formoterol  (SYMBICORT ) 80-4.5 MCG/ACT inhaler Inhale 2 puffs into the lungs 2 (two) times daily. 3 g 0   feeding supplement (ENSURE ENLIVE / ENSURE PLUS) LIQD Take 237 mLs by mouth 2 (two) times daily between meals.     fluticasone -salmeterol (ADVAIR) 250-50 MCG/ACT AEPB Inhale 1 puff into the lungs 2 (two) times daily.     gabapentin  (NEURONTIN ) 100 MG capsule TAKE 1 CAPSULE BY MOUTH AT  BEDTIME 100 capsule 2   Multiple Vitamin (MULTIVITAMIN WITH MINERALS) TABS tablet Take 1 tablet by mouth daily.     nitroGLYCERIN  (NITROSTAT ) 0.4 MG SL tablet Place 0.4 mg under the tongue every 5 (five) minutes as needed for chest pain.     QUEtiapine  (SEROQUEL ) 50 MG tablet Take 50 mg by mouth daily.     tiZANidine (ZANAFLEX) 2 MG tablet Take 2 mg by mouth at bedtime.     warfarin (COUMADIN ) 5 MG tablet Take 5 mg by mouth daily.     donepezil  (ARICEPT ) 10 MG tablet Take 10 mg by mouth daily. (Patient not taking: Reported on 11/28/2023)       Assessment: 83 y.o. male with h/o Afib, INR subtherapeutic, to continue warfarin.  Last known warfarin dose: 5mg /d (trying to confirm with family).  INR today was subtherapeutic at 1.2 after 2 doses of warfarin. On 6/30, underwent R thoracentesis with 2L removed. Last CBC 6/29 was stable. Oral intake documented at 100%. Discussed bridge therapy, but provider deferred. The patient was expected to discharge (7/1) with home health, however now the care team is looking into SNF placement. INR up slightly to 1.6 after 4 warfarin doses. CBC stable  Goal of Therapy:  INR 2-3 Monitor platelets by anticoagulation protocol: Yes   Plan:  -Warfarin 7.5 mg po today -Daily PT/INR  Thank you for allowing pharmacy to participate in this patient's care,  Koren Or, PharmD Clinical Pharmacist 12/02/2023 8:43 AM Please check AMION for all [facility] Pharmacy numbers

## 2023-12-02 NOTE — Plan of Care (Signed)
  Problem: Health Behavior/Discharge Planning: Goal: Ability to manage health-related needs will improve Outcome: Progressing   Problem: Clinical Measurements: Goal: Ability to maintain clinical measurements within normal limits will improve Outcome: Progressing   Problem: Activity: Goal: Capacity to carry out activities will improve Outcome: Progressing

## 2023-12-02 NOTE — Progress Notes (Signed)
 Pt seen and examined, remains medically stable for DC ToC attempting to contact family for DC options on account of Dementia, unsuccessful so far today  Sigurd Pac, MD

## 2023-12-02 NOTE — TOC Progression Note (Addendum)
 Transition of Care [facility]) - Progression Note    Patient Details  Name: Matthew Edwards MRN: [id number] Date of Birth: [birth year]  Transition of Care [facility]) CM/SW Contact  Luise JAYSON Pan, [place] Phone Number: 12/02/2023, 8:55 AM  Clinical Narrative:   CSW called [name] to discuss copay information obtained yesterday from SNFs. [name] did not answer and CSW could not leave a voicemail as her mailbox is full.  10:12 AM CSW attempted to contact [name] again, cannot leave a VM. CSW attempted to contact patients son, [name], and left a VM.   TOC will continue to follow.    Expected Discharge Plan: Skilled Nursing Facility Barriers to Discharge: Continued Medical Work up  Expected Discharge Plan and Services In-house Referral: Clinical Social Work     Living arrangements for the past 2 months: Single Family Home Expected Discharge Date: 11/30/23                                     Social Determinants of Health (SDOH) Interventions SDOH Screenings   Food Insecurity: Patient Unable To Answer (11/28/2023)  Housing: Patient Unable To Answer (11/28/2023)  Transportation Needs: Patient Unable To Answer (11/28/2023)  Utilities: Patient Unable To Answer (11/28/2023)  Depression (PHQ2-9): Medium Risk (11/19/2020)  Financial Resource Strain: Low Risk  (12/09/2022)   Received from [facility]  Social Connections: Patient Unable To Answer (11/28/2023)  Stress: No Stress Concern Present (03/22/2022)   Received from [facility]  Tobacco Use: Medium Risk (11/28/2023)    Readmission Risk Interventions    11/30/2023    1:16 PM  Readmission Risk Prevention Plan  Transportation Screening Complete  PCP or Specialist Appt within 3-5 Days Complete  HRI or Home Care Consult Complete  Palliative Care Screening Not Applicable  Medication Review (RN Care Manager) Complete

## 2023-12-02 NOTE — Care Management Important Message (Signed)
 Important Message  Patient Details  Name: KYLIAN LOH MRN: [id number] Date of Birth: Dec 11, 1940   Important Message Given:  Yes - Medicare IM     Vonzell Arrie Sharps 12/02/2023, 10:53 AM

## 2023-12-03 LAB — PROTIME-INR
INR: 2.1 — ABNORMAL HIGH (ref 0.8–1.2)
Prothrombin Time: 24.7 s — ABNORMAL HIGH (ref 11.4–15.2)

## 2023-12-03 MED ORDER — WARFARIN SODIUM 5 MG PO TABS
5.0000 mg | ORAL_TABLET | Freq: Every day | ORAL | Status: DC
  Administered 2023-12-03: 5 mg via ORAL
  Filled 2023-12-03: qty 1

## 2023-12-03 MED ORDER — ORAL CARE MOUTH RINSE: 15.0000 mL | OROMUCOSAL | Status: DC | PRN

## 2023-12-03 NOTE — Progress Notes (Signed)
 Occupational Therapy Treatment Patient Details Name: Matthew Edwards MRN: [id number] DOB: Jul 10, 1940 Today's Date: 12/03/2023   History of present illness 83 y.o. male adm 11/27/23 with SOB, LB edema, Rt pleural effusion and CHF exacerbation. 6/30 Rt thoracentesis. PMHx:AVR, CAD s/p CABG, chronic hypoxic respiratory failure 2 L O2, COPD, HFpEF, PAF, cardiac fibroelastoma, GAD, DMII, peripheral neuropathy, dementia   OT comments  Pt progressing well towards goals. Pt progressed today to completing LB dressing to min assist for socks. Improved standing balance for standing ADLs to CGA. Cog and safety awareness continue to limit pt. Recommendation of <3 hours of skilled rehab daily to optimize independence levels. If unable to, then pt can d/c home with max HH services and 24/7 family assistance/supervision. Will continue to follow acutely.       If plan is discharge home, recommend the following:  A little help with walking and/or transfers;A little help with bathing/dressing/bathroom;A lot of help with bathing/dressing/bathroom;Assistance with cooking/housework;Direct supervision/assist for medications management;Direct supervision/assist for financial management;Assist for transportation;Help with stairs or ramp for entrance   Equipment Recommendations  BSC/3in1;Wheelchair (measurements OT);Wheelchair cushion (measurements OT)    Recommendations for Other Services      Precautions / Restrictions Precautions Precautions: Fall;Other (comment) Recall of Precautions/Restrictions: Impaired Precaution/Restrictions Comments: watch for knee buckling Restrictions Weight Bearing Restrictions Per Provider Order: No       Mobility Bed Mobility Overal bed mobility: Needs Assistance Bed Mobility: Supine to Sit     Supine to sit: Min assist     General bed mobility comments: Min HH assist for trunk at pt's request    Transfers Overall transfer level: Needs assistance Equipment used:  Rolling walker (2 wheels) Transfers: Sit to/from Stand, Bed to chair/wheelchair/BSC Sit to Stand: Mod assist, Contact guard assist     Step pivot transfers: Contact guard assist     General transfer comment: Initial STS from bed mod assist, from recliner 3x STS with CGA     Balance Overall balance assessment: Needs assistance Sitting-balance support: No upper extremity supported, Feet supported Sitting balance-Leahy Scale: Good Sitting balance - Comments: EOB   Standing balance support: Bilateral upper extremity supported, Reliant on assistive device for balance Standing balance-Leahy Scale: Poor Standing balance comment: Reliant on RW         ADL either performed or assessed with clinical judgement   ADL Overall ADL's : Needs assistance/impaired       Lower Body Dressing: Minimal assistance;Sit to/from stand Lower Body Dressing Details (indicate cue type and reason): Assist for socks, adequate balance for pants Toilet Transfer: Rolling walker (2 wheels);Contact guard assist Toilet Transfer Details (indicate cue type and reason): Simulated in room, short distance         Functional mobility during ADLs: Contact guard assist;Rolling walker (2 wheels) General ADL Comments: Cues for safety and following commands    Extremity/Trunk Assessment Upper Extremity Assessment Upper Extremity Assessment: Generalized weakness   Lower Extremity Assessment Lower Extremity Assessment: Defer to PT evaluation        Vision   Vision Assessment?: No apparent visual deficits         Communication Communication Communication: Impaired Factors Affecting Communication: Hearing impaired   Cognition Arousal: Alert Behavior During Therapy: Flat affect Cognition: History of cognitive impairments       OT - Cognition Comments: Hx of dementia, needs increased time and multimodal cues for simple commands       Following commands: Impaired Following commands impaired: Follows  one step commands with increased  time      Cueing   Cueing Techniques: Verbal cues, Gestural cues        General Comments VSS on 2L    Pertinent Vitals/ Pain       Pain Assessment Pain Assessment: No/denies pain   Frequency  Min 2X/week        Progress Toward Goals  OT Goals(current goals can now be found in the care plan section)  Progress towards OT goals: Progressing toward goals  Acute Rehab OT Goals OT Goal Formulation: Patient unable to participate in goal setting Time For Goal Achievement: 12/14/23 Potential to Achieve Goals: Good ADL Goals Pt Will Perform Grooming: with supervision;standing Pt Will Perform Upper Body Bathing: sitting;with modified independence Pt Will Perform Lower Body Bathing: with supervision;sit to/from stand Pt Will Transfer to Toilet: with supervision;ambulating  Plan         AM-PAC OT 6 Clicks Daily Activity     Outcome Measure   Help from another person eating meals?: None Help from another person taking care of personal grooming?: A Little Help from another person toileting, which includes using toliet, bedpan, or urinal?: A Little Help from another person bathing (including washing, rinsing, drying)?: A Little Help from another person to put on and taking off regular upper body clothing?: A Little Help from another person to put on and taking off regular lower body clothing?: A Little 6 Click Score: 19    End of Session Equipment Utilized During Treatment: Gait belt;Rolling walker (2 wheels)  OT Visit Diagnosis: Unsteadiness on feet (R26.81);Muscle weakness (generalized) (M62.81)   Activity Tolerance Patient tolerated treatment well   Patient Left in chair;with call bell/phone within reach;with chair alarm set   Nurse Communication Mobility status        Time: 8854-8795 OT Time Calculation (min): 19 min  Charges: OT General Charges $OT Visit: 1 Visit OT Treatments $Self Care/Home Management : 8-22  mins  [name], OT  Acute Rehabilitation Services Office [phone] Secure chat preferred   [name] 12/03/2023, 12:26 PM

## 2023-12-03 NOTE — Progress Notes (Signed)
 PHARMACY - ANTICOAGULATION CONSULT NOTE  Pharmacy Consult for Warfarin Indication: atrial fibrillation  Allergies  Allergen Reactions   Pineapple Concentrate Nausea And Vomiting    Patient Measurements: Height: 5' 9 (175.3 cm) Weight: 74.4 kg (164 lb 0.4 oz) IBW/kg (Calculated) : 70.7 HEPARIN DW (KG): 77.7  Vital Signs: Temp: 98.1 F (36.7 C) (07/04 1136) Temp Source: Oral (07/04 1136) BP: 105/67 (07/04 1136) Pulse Rate: 80 (07/04 1136)  Labs: Recent Labs    12/01/23 0222 12/02/23 0219 12/03/23 0232  HGB  --  11.2*  --   HCT  --  37.0*  --   PLT  --  180  --   LABPROT 18.2* 19.9* 24.7*  INR 1.4* 1.6* 2.1*    Estimated Creatinine Clearance: 56.7 mL/min (by C-G formula based on SCr of 1.04 mg/dL).  Assessment: 83 y.o. male with hx atrial fibrillation and on Warfarin prior to admit.  INR subtherapeutic (1.3) on 6/29; PM admit 6/28 pm. Last known PTA regimen: 5 mg daily.   S/p R thoracentesis on 11/29/23 with 2L removed. Warfarin initially continued with 5 mg but, but INR slow to rise. INR back into therapeutic range (2.1) today after warfarin dose increased to 7.5 mg x 2 days.   Goal of Therapy:  INR 2-3 Monitor platelets by anticoagulation protocol: Yes   Plan:  Resume warfarin 5 mg PO daily. Daily PT/INR for now.  Genaro Zebedee Calin, RPh 12/03/2023,12:50 PM

## 2023-12-03 NOTE — Progress Notes (Signed)
 Physical Therapy Treatment Patient Details Name: Matthew Edwards MRN: [id number] DOB: 10-Jul-1940 Today's Date: 12/03/2023   History of Present Illness 83 y.o. male adm 11/27/23 with SOB, LB edema, Rt pleural effusion and CHF exacerbation. 6/30 Rt thoracentesis. PMHx:AVR, CAD s/p CABG, chronic hypoxic respiratory failure 2 L O2, COPD, HFpEF, PAF, cardiac fibroelastoma, GAD, DMII, peripheral neuropathy, dementia    PT Comments  Pt pleasant, HOH and needs cues and direction to initiate and complete transfers. Pt with limited carryover of education completed during session and demonstrates partial knee buckling with fatigue but able to recognize this deficit today. Pt educated for increased short bouts of gait to prevent falls and work on strengthening. Will continue to follow.     If plan is discharge home, recommend the following: A little help with walking and/or transfers;A lot of help with bathing/dressing/bathroom;Assistance with cooking/housework;Direct supervision/assist for medications management;Direct supervision/assist for financial management;Assist for transportation;Supervision due to cognitive status;Help with stairs or ramp for entrance   Can travel by private vehicle     Yes  Equipment Recommendations  None recommended by PT    Recommendations for Other Services       Precautions / Restrictions Precautions Precautions: Fall;Other (comment) Recall of Precautions/Restrictions: Impaired Precaution/Restrictions Comments: watch for knee buckling     Mobility  Bed Mobility Overal bed mobility: Needs Assistance Bed Mobility: Supine to Sit     Supine to sit: Used rails, Min assist     General bed mobility comments: hand held assist to lift trunk from surface with bed flat, cues to complete scooting fully to EOB    Transfers Overall transfer level: Needs assistance   Transfers: Sit to/from Stand Sit to Stand: Contact guard assist           General transfer  comment: cues for hand placement as pt with tendency to keeps hands on RW despite repeated cues and attempts, decreased control of descent. Pt stood from bed then performed 11 repeated sit ot stands from chair with reliance on UB support    Ambulation/Gait Ambulation/Gait assistance: Contact guard assist Gait Distance (Feet): 60 Feet Assistive device: Rolling walker (2 wheels) Gait Pattern/deviations: Step-through pattern, Trunk flexed, Knees buckling   Gait velocity interpretation: <1.8 ft/sec, indicate of risk for recurrent falls   General Gait Details: pt walked 34' limited by partial knee buckling and fatigue then completed additional 50' after seated rest and repeated sit to stands   Optometrist     Tilt Bed    Modified Rankin (Stroke Patients Only)       Balance Overall balance assessment: Needs assistance Sitting-balance support: No upper extremity supported, Feet supported Sitting balance-Leahy Scale: Good Sitting balance - Comments: EOB   Standing balance support: Bilateral upper extremity supported, Reliant on assistive device for balance Standing balance-Leahy Scale: Poor Standing balance comment: RW in standing                            Communication Communication Communication: Impaired Factors Affecting Communication: Hearing impaired  Cognition Arousal: Alert Behavior During Therapy: Flat affect   PT - Cognitive impairments: Memory, Awareness, Problem solving, Safety/Judgement, History of cognitive impairments                           Following commands impaired: Follows one step commands with increased time  Cueing Cueing Techniques: Verbal cues, Gestural cues  Exercises      General Comments        Pertinent Vitals/Pain Pain Assessment Pain Assessment: No/denies pain    Home Living                          Prior Function            PT Goals (current goals can now  be found in the care plan section) Progress towards PT goals: Progressing toward goals    Frequency    Min 2X/week      PT Plan      Co-evaluation              AM-PAC PT 6 Clicks Mobility   Outcome Measure  Help needed turning from your back to your side while in a flat bed without using bedrails?: A Little Help needed moving from lying on your back to sitting on the side of a flat bed without using bedrails?: A Little Help needed moving to and from a bed to a chair (including a wheelchair)?: A Little Help needed standing up from a chair using your arms (e.g., wheelchair or bedside chair)?: A Little Help needed to walk in hospital room?: A Little Help needed climbing 3-5 steps with a railing? : Total 6 Click Score: 16    End of Session   Activity Tolerance: Patient tolerated treatment well Patient left: in chair;with call bell/phone within reach;with chair alarm set Nurse Communication: Mobility status PT Visit Diagnosis: Other abnormalities of gait and mobility (R26.89);Muscle weakness (generalized) (M62.81);Difficulty in walking, not elsewhere classified (R26.2)     Time: 9158-9090 PT Time Calculation (min) (ACUTE ONLY): 28 min  Charges:    $Gait Training: 8-22 mins $Therapeutic Activity: 8-22 mins PT General Charges $$ ACUTE PT VISIT: 1 Visit                     Lenoard SQUIBB, PT Acute Rehabilitation Services Office: [phone]    Lenoard NOVAK Destiny Hagin 12/03/2023, 10:49 AM

## 2023-12-03 NOTE — TOC Progression Note (Signed)
 Transition of Care [facility]) - Progression Note    Patient Details  Name: Matthew Edwards MRN: [id number] Date of Birth: 06-14-1940  Transition of Care [facility]) CM/SW Contact  Waddell Barnie Rama, RN Phone Number: 12/03/2023, 4:10 PM  Clinical Narrative:    Daughter is in the room, she states she will take him home tomorrow, he is set up with  [facility] for [facility], HHPT, HHOT, HHAIDE and CSW will need orders.  Soc will begin 24 to 48 hrs post dc.  NCM gave [name] resources for private duty.  He will go home by car.  She will pick him up around 3 pm.   Expected Discharge Plan: Skilled Nursing Facility Barriers to Discharge: Continued Medical Work up  Expected Discharge Plan and Services In-house Referral: Clinical Social Work     Living arrangements for the past 2 months: Single Family Home Expected Discharge Date: 11/30/23                                     Social Determinants of Health (SDOH) Interventions SDOH Screenings   Food Insecurity: Patient Unable To Answer (11/28/2023)  Housing: Low Risk  (12/02/2023)  Transportation Needs: Patient Unable To Answer (11/28/2023)  Utilities: Patient Unable To Answer (11/28/2023)  Depression (PHQ2-9): Medium Risk (11/19/2020)  Financial Resource Strain: Low Risk  (12/09/2022)   Received from [facility]  Social Connections: Patient Unable To Answer (11/28/2023)  Stress: No Stress Concern Present (03/22/2022)   Received from [facility]  Tobacco Use: Medium Risk (11/28/2023)    Readmission Risk Interventions    11/30/2023    1:16 PM  Readmission Risk Prevention Plan  Transportation Screening Complete  PCP or Specialist Appt within 3-5 Days Complete  HRI or Home Care Consult Complete  Palliative Care Screening Not Applicable  Medication Review (RN Care Manager) Complete

## 2023-12-03 NOTE — Progress Notes (Signed)
 Patient seen and examined, medically stable for discharge, unfortunately patient is in co-pay days and family suggested on 7/2 that they will be unable to pay the co-pay for rehab, social work and case management has been unable to reach daughter or son since yesterday morning -I attempted to reach daughter additionally as well, left message on voicemail requesting a call back  Sigurd Pac, MD

## 2023-12-03 NOTE — TOC Progression Note (Signed)
 Transition of Care [facility]) - Progression Note    Patient Details  Name: Matthew Edwards MRN: [id number] Date of Birth: [birth year]  Transition of Care [facility]) CM/SW Contact  Waddell Barnie Rama, RN Phone Number: 12/03/2023, 9:42 AM  Clinical Narrative:    NCM tried to call the daughter [name] , no answer,  called Pacer the brother , left vm to have [name] to give this NCM a call back.    Expected Discharge Plan: Skilled Nursing Facility Barriers to Discharge: Continued Medical Work up  Expected Discharge Plan and Services In-house Referral: Clinical Social Work     Living arrangements for the past 2 months: Single Family Home Expected Discharge Date: 11/30/23                                     Social Determinants of Health (SDOH) Interventions SDOH Screenings   Food Insecurity: Patient Unable To Answer (11/28/2023)  Housing: Low Risk  (12/02/2023)  Transportation Needs: Patient Unable To Answer (11/28/2023)  Utilities: Patient Unable To Answer (11/28/2023)  Depression (PHQ2-9): Medium Risk (11/19/2020)  Financial Resource Strain: Low Risk  (12/09/2022)   Received from [facility]  Social Connections: Patient Unable To Answer (11/28/2023)  Stress: No Stress Concern Present (03/22/2022)   Received from [facility]  Tobacco Use: Medium Risk (11/28/2023)    Readmission Risk Interventions    11/30/2023    1:16 PM  Readmission Risk Prevention Plan  Transportation Screening Complete  PCP or Specialist Appt within 3-5 Days Complete  HRI or Home Care Consult Complete  Palliative Care Screening Not Applicable  Medication Review (RN Care Manager) Complete

## 2023-12-04 DIAGNOSIS — I5033 Acute on chronic diastolic (congestive) heart failure: Secondary | ICD-10-CM | POA: Diagnosis not present

## 2023-12-04 LAB — BASIC METABOLIC PANEL WITH GFR
Anion gap: 8 (ref 5–15)
BUN: 25 mg/dL — ABNORMAL HIGH (ref 8–23)
CO2: 33 mmol/L — ABNORMAL HIGH (ref 22–32)
Calcium: 9.6 mg/dL (ref 8.9–10.3)
Chloride: 94 mmol/L — ABNORMAL LOW (ref 98–111)
Creatinine, Ser: 0.99 mg/dL (ref 0.61–1.24)
GFR, Estimated: >60 mL/min (ref >60)
Glucose, Bld: 92 mg/dL (ref 70–99)
Potassium: 4.3 mmol/L (ref 3.5–5.1)
Sodium: 135 mmol/L (ref 135–145)

## 2023-12-04 LAB — PROTIME-INR
INR: 2.4 — ABNORMAL HIGH (ref 0.8–1.2)
Prothrombin Time: 26.9 s — ABNORMAL HIGH (ref 11.4–15.2)

## 2023-12-04 LAB — CBC
HCT: 37.5 % — ABNORMAL LOW (ref 39.0–52.0)
Hemoglobin: 12 g/dL — ABNORMAL LOW (ref 13.0–17.0)
MCH: 27.5 pg (ref 26.0–34.0)
MCHC: 32 g/dL (ref 30.0–36.0)
MCV: 86 fL (ref 80.0–100.0)
Platelets: 171 K/uL (ref 150–400)
RBC: 4.36 MIL/uL (ref 4.22–5.81)
RDW: 15.9 % — ABNORMAL HIGH (ref 11.5–15.5)
WBC: 6.5 K/uL (ref 4.0–10.5)
nRBC: 0 % (ref 0.0–0.2)

## 2023-12-04 NOTE — Discharge Summary (Addendum)
 Physician Discharge Summary  Matthew Edwards FMW:[id number] DOB: 01/28/1941 DOA: 11/27/2023  PCP: Matthew Leni Edyth DELENA, MD  Admit date: 11/27/2023 Discharge date: 12/04/2023  Time spent: 45 minutes  Recommendations for Outpatient Follow-up:  PCP Dr. Maree in 7 to 10 days, please check BMP at follow-up Continue warfarin, monitor INR in 1 week Home health PT OT RN Outpatient palliative care, please continue goals of care and CODE STATUS discussions   Discharge Diagnoses:  Principal Problem:   Acute on chronic diastolic CHF (congestive heart failure) (HCC) Active Problems:   Essential hypertension   Paroxysmal atrial fibrillation (HCC)   History of CAD (coronary artery disease)   Hypokalemia   Non-insulin  dependent type 2 diabetes mellitus (HCC)   History of COPD   Dementia without behavioral disturbance (HCC)   Discharge Condition: Proved  Diet recommendation: No sodium, diabetic  Filed Weights   12/02/23 0456 12/03/23 0421 12/04/23 0420  Weight: 72.4 kg 74.4 kg 69.2 kg    History of present illness:  83 yo male with the past medical history of aortic stenosis, sp AVR 2012, (tissue valve) coronary artery disease sp CABG 2012, COPD, heart failure, paroxysmal atrial fibrillation, T2DM, dementia and cardiac fibroelastoma who presented with dyspnea.  Recent hospitalization for heart failure 05/13 to 10/18/23 for heart failure, he was discharged with instructions to take 20 mg furosemide  daily.  At home his home health agency reduced to 10 mg daily of furosemide .  Reported 2 days of worsening dyspnea on exertion and lower extremity edema. Increased 02 requirements from 2 L/min per [place] to 5 L/min per [place].   Hospital Course:   Acute on chronic diastolic CHF, RV failure Pulmonary hypertension - Echo noted EF of 60-65%, mild LVH, moderately reduced RV - Diuresed with IV Lasix , symptoms improved, transitioned to oral Lasix , resumed spironolactone  and lisinopril  - Follow-up with PCP as  outpatient - Patient was discharged by Dr.Arrien last week, discharge delayed as family wanted SNF, seen by social work, co-pay felt to be a limiting factor, after much discussion back-and-forth family has decided to take him home today with home health services   Persistent atrial fibrillation (HCC) He has remained atrial fibrillation rhythm  -Continue with warfarin for anticoagulation,  Unfortunately his copay for DOAC is  high and her daughter would like to continue with warfarin for anticoagulation.  -He was also on metoprolol  however noted to have bradycardia at this admission, Toprol  discontinued, heart rate remained stable   History of CAD (coronary artery disease) Stable   Hypokalemia Repleted   Non-insulin  dependent type 2 diabetes mellitus (HCC) Diet controlled   History of COPD, chronic respiratory failure He has chronic hypoxemia, with home supplemental 02 per [place]    Dementia without behavioral disturbance (HCC) Continue quetiapine  Continue with home health services.     Discharge Exam: Vitals:   12/04/23 0711 12/04/23 1028  BP: 112/74 (!) 103/56  Pulse: 82 76  Resp: 14 17  Temp: 98.1 F (36.7 C) 97.7 F (36.5 C)  SpO2: 100% 100%   Gen: Awake, Alert, Oriented X 2, cognitive deficits HEENT: no JVD Lungs: Good air movement bilaterally, CTAB CVS: S1S2/RRR Abd: soft, Non tender, non distended, BS present Extremities: No edema Skin: no new rashes on exposed skin   Discharge Instructions   Discharge Instructions     Diet - low sodium heart healthy   Complete by: As directed    Increase activity slowly   Complete by: As directed       Allergies  as of 12/04/2023       Reactions   Pineapple Concentrate Nausea And Vomiting        Medication List     STOP taking these medications    budesonide -formoterol  80-4.5 MCG/ACT inhaler Commonly known as: Symbicort    donepezil  10 MG tablet Commonly known as: ARICEPT    metoprolol  succinate 50 MG 24 hr  tablet Commonly known as: TOPROL -XL       TAKE these medications    acetaminophen  500 MG tablet Commonly known as: TYLENOL  Take 500 mg by mouth 2 (two) times daily.   ascorbic acid  500 MG tablet Commonly known as: VITAMIN C  Take 500 mg by mouth daily.   feeding supplement Liqd Take 237 mLs by mouth 2 (two) times daily between meals.   fluticasone -salmeterol 250-50 MCG/ACT Aepb Commonly known as: ADVAIR Inhale 1 puff into the lungs 2 (two) times daily.   furosemide  20 MG tablet Commonly known as: LASIX  Take 1 tablet (20 mg total) by mouth daily. What changed: how much to take   gabapentin  100 MG capsule Commonly known as: NEURONTIN  TAKE 1 CAPSULE BY MOUTH AT  BEDTIME   lisinopril  5 MG tablet Commonly known as: ZESTRIL  Take 1 tablet (5 mg total) by mouth daily.   multivitamin with minerals Tabs tablet Take 1 tablet by mouth daily.   nitroGLYCERIN  0.4 MG SL tablet Commonly known as: NITROSTAT  Place 0.4 mg under the tongue every 5 (five) minutes as needed for chest pain.   QUEtiapine  50 MG tablet Commonly known as: SEROQUEL  Take 50 mg by mouth daily.   spironolactone  25 MG tablet Commonly known as: ALDACTONE  Take 0.5 tablets (12.5 mg total) by mouth daily.   tiZANidine  2 MG tablet Commonly known as: ZANAFLEX  Take 2 mg by mouth at bedtime.   warfarin 5 MG tablet Commonly known as: COUMADIN  Take 5 mg by mouth daily.       Allergies  Allergen Reactions   Pineapple Concentrate Nausea And Vomiting    Follow-up Information     Matthew Leni Edyth DELENA, MD Follow up.   Specialty: Family Medicine Why: Please follow up in a week. Contact information: [address] [place] [address] [phone]                  The results of significant diagnostics from this hospitalization (including imaging, microbiology, ancillary and laboratory) are listed below for reference.    Significant Diagnostic Studies: ECHOCARDIOGRAM LIMITED Result Date:  11/29/2023    ECHOCARDIOGRAM LIMITED REPORT   Patient Name:   Matthew Edwards Date of Exam: 11/29/2023 Medical Rec #:  [id number]       Height:       69.0 in Accession #:    [id number]      Weight:       164.7 lb Date of Birth:  06-06-1940       BSA:          1.902 m Patient Age:    82 years        BP:           112/64 mmHg Patient Gender: M               HR:           69 bpm. Exam Location:  Inpatient Procedure: 2D Echo, Cardiac Doppler, Color Doppler and Intracardiac            Opacification Agent (Both Spectral and Color Flow Doppler were  utilized during procedure). Indications:    CHF  History:        Patient has prior history of Echocardiogram examinations, most                 recent 10/13/2023. Aortic Valve Disease and Mitral Valve Disease,                 Arrythmias:Atrial Fibrillation; Risk Factors:Hypertension,                 Diabetes and Dyslipidemia.                 Aortic Valve: 25 mm Edwards pericardial valve is present in the                 aortic position. Procedure Date: 2012.  Sonographer:    Therisa Crouch Referring Phys: [id number] SUBRINA SUNDIL IMPRESSIONS  1. Left ventricular ejection fraction, by estimation, is 55 to 60%. The left ventricle has normal function. The left ventricle has no regional wall motion abnormalities. Left ventricular diastolic parameters are indeterminate.  2. Right ventricular systolic function is mildly reduced. The right ventricular size is mildly enlarged.  3. Left atrial size was severely dilated.  4. Right atrial size was severely dilated.  5. The mitral valve is degenerative. Mild mitral valve regurgitation. The mean mitral valve gradient is 5.0 mmHg. Severe mitral annular calcification.  6. The aortic valve has been repaired/replaced. There is a 25 mm Edwards pericardial valve present in the aortic position. Procedure Date: 2012. Aortic valve mean gradient measures 4.0 mmHg. Aortic valve Vmax measures 1.35 m/s.  7. There is a small echodensity in the LVOT  seen on prior studies. 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  8. The inferior vena cava is dilated in size with <50% respiratory variability, suggesting right atrial pressure of 15 mmHg. FINDINGS  Left Ventricle: Left ventricular ejection fraction, by estimation, is 55 to 60%. The left ventricle has normal function. The left ventricle has no regional wall motion abnormalities. There is no left ventricular hypertrophy. Left ventricular diastolic parameters are indeterminate. Left ventricular diastolic function could not be evaluated due to mitral valve replacement. Right Ventricle: The right ventricular size is mildly enlarged. No increase in right ventricular wall thickness. Right ventricular systolic function is mildly reduced. Left Atrium: Left atrial size was severely dilated. Right Atrium: Right atrial size was severely dilated. Mitral Valve: The mitral valve is degenerative in appearance. Severe mitral annular calcification. Mild mitral valve regurgitation. MV peak gradient, 13.0 mmHg. The mean mitral valve gradient is 5.0 mmHg. Tricuspid Valve: The tricuspid valve is normal in structure. Tricuspid valve regurgitation is mild. Aortic Valve: The aortic valve has been repaired/replaced. Aortic valve mean gradient measures 4.0 mmHg. Aortic valve peak gradient measures 7.3 mmHg. Aortic valve area, by VTI measures 1.83 cm. There is a 25 mm Edwards pericardial valve present in the aortic position. Procedure Date: 2012. Venous: The inferior vena cava is dilated in size with less than 50% respiratory variability, suggesting right atrial pressure of 15 mmHg. LEFT VENTRICLE PLAX 2D LVIDd:         3.60 cm LVIDs:         2.80 cm LV PW:         0.90 cm LV IVS:        0.70 cm LVOT diam:     2.00 cm LV SV:         49 LV SV Index:   26 LVOT Area:  3.14 cm  LV Volumes (MOD) LV vol d, MOD A2C: 119.0 ml LV vol d, MOD A4C: 74.6 ml LV vol s, MOD A2C: 48.2 ml LV vol s, MOD A4C: 33.7 ml LV SV MOD A2C:     70.8 ml LV SV MOD  A4C:     74.6 ml LV SV MOD BP:      61.6 ml RIGHT VENTRICLE            IVC RV Basal diam:  4.90 cm    IVC diam: 2.90 cm RV S prime:     6.20 cm/s TAPSE (M-mode): 1.8 cm LEFT ATRIUM              Index        RIGHT ATRIUM           Index LA diam:        5.30 cm  2.79 cm/m   RA Area:     25.30 cm LA Vol (A2C):   104.0 ml 54.67 ml/m  RA Volume:   81.20 ml  42.69 ml/m LA Vol (A4C):   124.0 ml 65.19 ml/m LA Biplane Vol: 120.0 ml 63.08 ml/m  AORTIC VALVE AV Area (Vmax):    1.99 cm AV Area (Vmean):   1.98 cm AV Area (VTI):     1.83 cm AV Vmax:           135.00 cm/s AV Vmean:          89.800 cm/s AV VTI:            0.270 m AV Peak Grad:      7.3 mmHg AV Mean Grad:      4.0 mmHg LVOT Vmax:         85.40 cm/s LVOT Vmean:        56.600 cm/s LVOT VTI:          0.157 m LVOT/AV VTI ratio: 0.58  AORTA Ao Root diam: 2.40 cm Ao Asc diam:  3.80 cm MITRAL VALVE MV Area (PHT): 2.32 cm    SHUNTS MV Area VTI:   1.10 cm    Systemic VTI:  0.16 m MV Peak grad:  13.0 mmHg   Systemic Diam: 2.00 cm MV Mean grad:  5.0 mmHg MV Vmax:       1.80 m/s MV Vmean:      107.0 cm/s Aditya Sabharwal Electronically signed by Ria Commander Signature Date/Time: 11/29/2023/5:01:00 PM    Final    IR THORACENTESIS ASP PLEURAL SPACE W/IMG GUIDE Result Date: 11/29/2023 INDICATION: 83 year old male with history of heart failure presents with shortness of breath. Previous imaging showed enlarging right pleural effusion. Request for therapeutic and diagnostic thoracentesis. EXAM: ULTRASOUND GUIDED RIGHT THORACENTESIS MEDICATIONS: 5 mL 1% lidocaine  COMPLICATIONS: None immediate. PROCEDURE: An ultrasound guided thoracentesis was thoroughly discussed with the patient's daughter and questions answered. The benefits, risks, alternatives and complications were also discussed. The patient understands and wishes to proceed with the procedure. Written consent was obtained. Ultrasound was performed to localize and mark an adequate pocket of fluid in the right  chest. The area was then prepped and draped in the normal sterile fashion. 1% Lidocaine  was used for local anesthesia. Under ultrasound guidance a 6 Fr Safe-T-Centesis catheter was introduced. Thoracentesis was performed. The catheter was removed and a dressing applied. FINDINGS: A total of approximately 2 L of hazy amber fluid was removed. Samples were sent to the laboratory as requested by the clinical team. Post procedure chest X-ray reviewed, negative for  pneumothorax. IMPRESSION: Successful ultrasound guided right thoracentesis yielding 2 L of pleural fluid. Performed by: Aimee Han, PA-C Electronically Signed   By: Marcey Moan M.D.   On: 11/29/2023 10:42   DG Chest 1 View Result Date: 11/29/2023 CLINICAL DATA:  Right thoracentesis. EXAM: CHEST  1 VIEW COMPARISON:  11/27/2023. FINDINGS: Trachea is midline. Heart is enlarged, stable. Aortic valve replacement. Small to moderate residual right pleural effusion after right thoracentesis. No definite pneumothorax. Improved right basilar collapse/consolidation. IMPRESSION: 1. Small to moderate residual right pleural effusion status post right thoracentesis. No pneumothorax. 2. Improving right basilar collapse/consolidation. Electronically Signed   By: Newell Eke M.D.   On: 11/29/2023 09:43   DG Chest Portable 1 View Result Date: 11/27/2023 CLINICAL DATA:  Shortness of breath. EXAM: PORTABLE CHEST 1 VIEW COMPARISON:  10/12/2023 FINDINGS: Prior median sternotomy with prosthetic aortic valve. Cardiomegaly is grossly stable. Right pleural effusion has increased in size, moderate to large. There may be a small left pleural effusion. Peribronchial thickening suggestive of pulmonary edema. No pneumothorax. IMPRESSION: 1. Increasing right pleural effusion, moderate to large. 2. Peribronchial thickening suggestive of pulmonary edema. 3. Stable cardiomegaly. Electronically Signed   By: Andrea Gasman M.D.   On: 11/27/2023 23:42    Microbiology: Recent  Results (from the past 240 hours)  Resp panel by RT-PCR (RSV, Flu A&B, Covid) Anterior Nasal Swab     Status: None   Collection Time: 11/27/23 11:24 PM   Specimen: Anterior Nasal Swab  Result Value Ref Range Status   SARS Coronavirus 2 by RT PCR NEGATIVE NEGATIVE Final   Influenza A by PCR NEGATIVE NEGATIVE Final   Influenza B by PCR NEGATIVE NEGATIVE Final    Comment: (NOTE) The Xpert Xpress SARS-CoV-2/FLU/RSV plus assay is intended as an aid in the diagnosis of influenza from Nasopharyngeal swab specimens and should not be used as a sole basis for treatment. Nasal washings and aspirates are unacceptable for Xpert Xpress SARS-CoV-2/FLU/RSV testing.  Fact Sheet for Patients: BloggerCourse.com  Fact Sheet for Healthcare Providers: SeriousBroker.it  This test is not yet approved or cleared by the [place]  FDA and has been authorized for detection and/or diagnosis of SARS-CoV-2 by FDA under an Emergency Use Authorization (EUA). This EUA will remain in effect (meaning this test can be used) for the duration of the COVID-19 declaration under Section 564(b)(1) of the Act, 21 U.S.C. section 360bbb-3(b)(1), unless the authorization is terminated or revoked.     Resp Syncytial Virus by PCR NEGATIVE NEGATIVE Final    Comment: (NOTE) Fact Sheet for Patients: BloggerCourse.com  Fact Sheet for Healthcare Providers: SeriousBroker.it  This test is not yet approved or cleared by the [place]  FDA and has been authorized for detection and/or diagnosis of SARS-CoV-2 by FDA under an Emergency Use Authorization (EUA). This EUA will remain in effect (meaning this test can be used) for the duration of the COVID-19 declaration under Section 564(b)(1) of the Act, 21 U.S.C. section 360bbb-3(b)(1), unless the authorization is terminated or revoked.  Performed at [facility], [address] [place], [place] [address]   Body fluid culture w Gram Stain     Status: None   Collection Time: 11/29/23  9:36 AM   Specimen: Lung, Right; Pleural Fluid  Result Value Ref Range Status   Specimen Description PLEURAL  Final   Special Requests RIGHT LUNG  Final   Gram Stain   Final    RARE WBC PRESENT,BOTH PMN AND MONONUCLEAR NO ORGANISMS SEEN  Culture   Final    NO GROWTH 3 DAYS Performed at [facility], [address], [place], [place] [address]    Report Status 12/02/2023 FINAL  Final     Labs: Basic Metabolic Panel: Recent Labs  Lab 11/27/23 2304 11/28/23 0509 11/29/23 0318 11/30/23 0242 12/04/23 0308  NA 143 142 143 140 135  K 3.4* 3.6 3.5 3.6 4.3  CL 103 102 96* 92* 94*  CO2 32 31 36* 39* 33*  GLUCOSE 139* 106* 138* 103* 92  BUN 11 9 12 13  25*  CREATININE 0.87 0.81 1.00 1.04 0.99  CALCIUM  9.3 8.8* 9.7 9.6 9.6  MG  --   --  1.7 2.2  --    Liver Function Tests: Recent Labs  Lab 11/27/23 2304 11/28/23 0509  AST 20 19  ALT 12 12  ALKPHOS 68 70  BILITOT 0.8 1.1  PROT 6.6 6.5  ALBUMIN  3.1* 3.3*   No results for input(s): LIPASE, AMYLASE in the last 168 hours. No results for input(s): AMMONIA in the last 168 hours. CBC: Recent Labs  Lab 11/27/23 2304 11/28/23 0509 12/02/23 0219 12/04/23 0308  WBC 6.7 5.1 7.4 6.5  HGB 11.3* 10.8* 11.2* 12.0*  HCT 38.0* 36.2* 37.0* 37.5*  MCV 90.7 92.3 86.9 86.0  PLT 189 169 180 171   Cardiac Enzymes: No results for input(s): CKTOTAL, CKMB, CKMBINDEX, TROPONINI in the last 168 hours. BNP: BNP (last 3 results) Recent Labs    10/12/23 1755 11/27/23 2304  BNP 176.2* 267.4*    ProBNP (last 3 results) No results for input(s): PROBNP in the last 8760 hours.  CBG: No results for input(s): GLUCAP in the last 168 hours.     Signed:  Sigurd Pac MD.  [facility] 12/04/2023, 11:16 AM

## 2023-12-04 NOTE — TOC Transition Note (Signed)
 Transition of Care [facility]) - Discharge Note   Patient Details  Name: Matthew Edwards MRN: [id number] Date of Birth: 1940/06/19  Transition of Care [facility]) CM/SW Contact:  Marval Gell, RN Phone Number: 12/04/2023, 11:44 AM   Clinical Narrative:     [facility] that patient will DC today   Final next level of care: Home w Home Health Services Barriers to Discharge: No Barriers Identified   Patient Goals and CMS Choice Patient states their goals for this hospitalization and ongoing recovery are:: To go to rehab          Discharge Placement                       Discharge Plan and Services Additional resources added to the After Visit Summary for   In-house Referral: Clinical Social Work                          [facility] Agency: [facility] Date [facility] Agency Contacted: 12/04/23 Time HH Agency Contacted: 1144 Representative spoke with at [facility] Agency: [place]  Social Drivers of Health (SDOH) Interventions SDOH Screenings   Food Insecurity: Patient Unable To Answer (11/28/2023)  Housing: Low Risk  (12/02/2023)  Transportation Needs: Patient Unable To Answer (11/28/2023)  Utilities: Patient Unable To Answer (11/28/2023)  Depression (PHQ2-9): Medium Risk (11/19/2020)  Financial Resource Strain: Low Risk  (12/09/2022)   [facility] from [facility]  Social Connections: Patient Unable To Answer (11/28/2023)  Stress: No Stress Concern Present (03/22/2022)   Received from [facility]  Tobacco Use: Medium Risk (11/28/2023)     Readmission Risk Interventions    11/30/2023    1:16 PM  Readmission Risk Prevention Plan  Transportation Screening Complete  PCP or Specialist Appt within 3-5 Days Complete  HRI or Home Care Consult Complete  Palliative Care Screening Not Applicable  Medication Review (RN Care Manager) Complete

## 2023-12-04 NOTE — Progress Notes (Signed)
 PHARMACY - ANTICOAGULATION CONSULT NOTE  Pharmacy Consult for Warfarin Indication: atrial fibrillation  Allergies  Allergen Reactions   Pineapple Concentrate Nausea And Vomiting    Patient Measurements: Height: 5' 9 (175.3 cm) Weight: 69.2 kg (152 lb 8.9 oz) IBW/kg (Calculated) : 70.7 HEPARIN DW (KG): 77.7  Vital Signs: Temp: 97.7 F (36.5 C) (07/05 1028) Temp Source: Oral (07/05 1028) BP: 103/56 (07/05 1028) Pulse Rate: 76 (07/05 1028)  Labs: Recent Labs    12/02/23 0219 12/03/23 0232 12/04/23 0308  HGB 11.2*  --  12.0*  HCT 37.0*  --  37.5*  PLT 180  --  171  LABPROT 19.9* 24.7* 26.9*  INR 1.6* 2.1* 2.4*  CREATININE  --   --  0.99    Estimated Creatinine Clearance: 58.2 mL/min (by C-G formula based on SCr of 0.99 mg/dL).  Assessment: 83 y.o. male with hx atrial fibrillation and on Warfarin prior to admit.  INR subtherapeutic (1.3) on 6/29; PM admit 6/28 pm. Last known PTA regimen: 5 mg daily.   S/p R thoracentesis on 11/29/23 with 2L removed. Warfarin initially continued with 5 mg but, but INR slow to rise. INR back into therapeutic range (2.1) on 7/4 after warfarin dose increased to 7.5 mg x 2 days.  Warfarin 5 mg daily resumed on 7/4 > INR 2.4 today.  Goal of Therapy:  INR 2-3 Monitor platelets by anticoagulation protocol: Yes   Plan:  Discharging on Warfarin 5 mg PO daily. Agree. Would check outpatient PT/INR next week.   Genaro Zebedee Calin, RPh 12/04/2023,11:30 AM

## 2023-12-04 NOTE — Progress Notes (Signed)
 Central Telemetry notified RN of patient having frequent pauses 2.25 seconds is the longest measured. Dr. Fairy notified, received orders to discontinue Toprol  XL today.

## 2023-12-04 NOTE — Progress Notes (Signed)
 Mobility Specialist Progress Note:    12/04/23 0840  Mobility  Activity Ambulated with assistance in room;Ambulated with assistance in hallway;Transferred from bed to chair  Level of Assistance Minimal assist, patient does 75% or more  Assistive Device Front wheel walker  Distance Ambulated (ft) 80 ft  Activity Response Tolerated well  Mobility Referral Yes  Mobility visit 1 Mobility  Mobility Specialist Start Time (ACUTE ONLY) 0840  Mobility Specialist Stop Time (ACUTE ONLY) [id number]  Mobility Specialist Time Calculation (min) (ACUTE ONLY) 12 min   Pt received in bed, agreeable to mobility session. Required MinA to stand, CGA with gait belt for safety. Encouraged pt to take standing rest break to recover for SOB. Pt c/o soreness. Returned pt to room, sitting up in chair with all needs met. RN notified.   Pre Mobility: SpO2 97% on 2L During Mobility:SpO2 80% on 2L, increased O2 flow, SpO2 88% on 3L Post Mobility: SpO2 94% on 2L, sitting in chair  Matthew Edwards Mobility Specialist Please contact via Special educational needs teacher or  Rehab office at [phone]   Matthew Edwards Mobility Specialist Please contact via SecureChat or  Rehab office at [phone]

## 2024-01-03 ENCOUNTER — Inpatient Hospital Stay (HOSPITAL_COMMUNITY)
Admission: EM | Admit: 2024-01-03 | Discharge: 2024-01-31 | DRG: 871 | Disposition: E | Attending: Internal Medicine | Admitting: Internal Medicine

## 2024-01-03 ENCOUNTER — Emergency Department (HOSPITAL_COMMUNITY)

## 2024-01-03 DIAGNOSIS — G9340 Encephalopathy, unspecified: Secondary | ICD-10-CM

## 2024-01-03 DIAGNOSIS — J90 Pleural effusion, not elsewhere classified: Principal | ICD-10-CM | POA: Diagnosis present

## 2024-01-03 DIAGNOSIS — F17210 Nicotine dependence, cigarettes, uncomplicated: Secondary | ICD-10-CM | POA: Diagnosis present

## 2024-01-03 DIAGNOSIS — Z9889 Other specified postprocedural states: Secondary | ICD-10-CM

## 2024-01-03 DIAGNOSIS — R1310 Dysphagia, unspecified: Secondary | ICD-10-CM | POA: Diagnosis present

## 2024-01-03 DIAGNOSIS — R55 Syncope and collapse: Secondary | ICD-10-CM | POA: Diagnosis not present

## 2024-01-03 DIAGNOSIS — Z955 Presence of coronary angioplasty implant and graft: Secondary | ICD-10-CM

## 2024-01-03 DIAGNOSIS — Z66 Do not resuscitate: Secondary | ICD-10-CM | POA: Diagnosis not present

## 2024-01-03 DIAGNOSIS — E785 Hyperlipidemia, unspecified: Secondary | ICD-10-CM | POA: Diagnosis present

## 2024-01-03 DIAGNOSIS — Z7951 Long term (current) use of inhaled steroids: Secondary | ICD-10-CM

## 2024-01-03 DIAGNOSIS — J918 Pleural effusion in other conditions classified elsewhere: Secondary | ICD-10-CM | POA: Diagnosis present

## 2024-01-03 DIAGNOSIS — Z9981 Dependence on supplemental oxygen: Secondary | ICD-10-CM

## 2024-01-03 DIAGNOSIS — Z7901 Long term (current) use of anticoagulants: Secondary | ICD-10-CM

## 2024-01-03 DIAGNOSIS — I359 Nonrheumatic aortic valve disorder, unspecified: Secondary | ICD-10-CM

## 2024-01-03 DIAGNOSIS — A419 Sepsis, unspecified organism: Secondary | ICD-10-CM | POA: Diagnosis not present

## 2024-01-03 DIAGNOSIS — F0390 Unspecified dementia without behavioral disturbance: Secondary | ICD-10-CM

## 2024-01-03 DIAGNOSIS — J9621 Acute and chronic respiratory failure with hypoxia: Secondary | ICD-10-CM | POA: Diagnosis present

## 2024-01-03 DIAGNOSIS — I5032 Chronic diastolic (congestive) heart failure: Secondary | ICD-10-CM

## 2024-01-03 DIAGNOSIS — E1165 Type 2 diabetes mellitus with hyperglycemia: Secondary | ICD-10-CM | POA: Diagnosis present

## 2024-01-03 DIAGNOSIS — Z515 Encounter for palliative care: Secondary | ICD-10-CM

## 2024-01-03 DIAGNOSIS — I110 Hypertensive heart disease with heart failure: Secondary | ICD-10-CM | POA: Diagnosis present

## 2024-01-03 DIAGNOSIS — I5033 Acute on chronic diastolic (congestive) heart failure: Secondary | ICD-10-CM | POA: Diagnosis present

## 2024-01-03 DIAGNOSIS — I2510 Atherosclerotic heart disease of native coronary artery without angina pectoris: Secondary | ICD-10-CM | POA: Diagnosis present

## 2024-01-03 DIAGNOSIS — I4821 Permanent atrial fibrillation: Secondary | ICD-10-CM | POA: Diagnosis present

## 2024-01-03 DIAGNOSIS — I272 Pulmonary hypertension, unspecified: Secondary | ICD-10-CM | POA: Diagnosis present

## 2024-01-03 DIAGNOSIS — G4733 Obstructive sleep apnea (adult) (pediatric): Secondary | ICD-10-CM | POA: Diagnosis present

## 2024-01-03 DIAGNOSIS — F0394 Unspecified dementia, unspecified severity, with anxiety: Secondary | ICD-10-CM | POA: Diagnosis present

## 2024-01-03 DIAGNOSIS — Z952 Presence of prosthetic heart valve: Secondary | ICD-10-CM

## 2024-01-03 DIAGNOSIS — K219 Gastro-esophageal reflux disease without esophagitis: Secondary | ICD-10-CM | POA: Diagnosis present

## 2024-01-03 DIAGNOSIS — Z951 Presence of aortocoronary bypass graft: Secondary | ICD-10-CM

## 2024-01-03 DIAGNOSIS — J449 Chronic obstructive pulmonary disease, unspecified: Secondary | ICD-10-CM | POA: Diagnosis present

## 2024-01-03 DIAGNOSIS — D649 Anemia, unspecified: Secondary | ICD-10-CM | POA: Diagnosis present

## 2024-01-03 DIAGNOSIS — Z818 Family history of other mental and behavioral disorders: Secondary | ICD-10-CM

## 2024-01-03 DIAGNOSIS — G9341 Metabolic encephalopathy: Secondary | ICD-10-CM | POA: Diagnosis present

## 2024-01-03 DIAGNOSIS — Z860100 Personal history of colon polyps, unspecified: Secondary | ICD-10-CM

## 2024-01-03 DIAGNOSIS — Z79899 Other long term (current) drug therapy: Secondary | ICD-10-CM

## 2024-01-03 DIAGNOSIS — E1142 Type 2 diabetes mellitus with diabetic polyneuropathy: Secondary | ICD-10-CM | POA: Diagnosis present

## 2024-01-03 DIAGNOSIS — Z91018 Allergy to other foods: Secondary | ICD-10-CM

## 2024-01-03 DIAGNOSIS — E872 Acidosis, unspecified: Secondary | ICD-10-CM | POA: Diagnosis present

## 2024-01-03 DIAGNOSIS — Z1152 Encounter for screening for COVID-19: Secondary | ICD-10-CM

## 2024-01-03 DIAGNOSIS — I340 Nonrheumatic mitral (valve) insufficiency: Secondary | ICD-10-CM | POA: Diagnosis present

## 2024-01-03 DIAGNOSIS — Z9049 Acquired absence of other specified parts of digestive tract: Secondary | ICD-10-CM

## 2024-01-03 DIAGNOSIS — Z953 Presence of xenogenic heart valve: Secondary | ICD-10-CM

## 2024-01-03 DIAGNOSIS — Z8249 Family history of ischemic heart disease and other diseases of the circulatory system: Secondary | ICD-10-CM

## 2024-01-03 DIAGNOSIS — R627 Adult failure to thrive: Secondary | ICD-10-CM | POA: Diagnosis present

## 2024-01-03 DIAGNOSIS — I10 Essential (primary) hypertension: Secondary | ICD-10-CM

## 2024-01-03 DIAGNOSIS — I493 Ventricular premature depolarization: Secondary | ICD-10-CM | POA: Diagnosis present

## 2024-01-03 DIAGNOSIS — M1990 Unspecified osteoarthritis, unspecified site: Secondary | ICD-10-CM | POA: Diagnosis present

## 2024-01-03 DIAGNOSIS — Z794 Long term (current) use of insulin: Secondary | ICD-10-CM

## 2024-01-03 LAB — I-STAT CG4 LACTIC ACID, ED: Lactic Acid, Venous: 2.7 mmol/L (ref 0.5–1.9)

## 2024-01-03 MED ORDER — SODIUM CHLORIDE 0.9 % IV SOLN
2.0000 g | Freq: Once | INTRAVENOUS | Status: AC
  Administered 2024-01-04: 2 g via INTRAVENOUS
  Filled 2024-01-03: qty 12.5

## 2024-01-03 MED ORDER — VANCOMYCIN HCL IN DEXTROSE 1-5 GM/200ML-% IV SOLN: 1000.0000 mg | Freq: Once | INTRAVENOUS | Status: DC

## 2024-01-03 MED ORDER — LACTATED RINGERS IV SOLN: INTRAVENOUS | Status: DC

## 2024-01-03 MED ORDER — VANCOMYCIN HCL 1750 MG/350ML IV SOLN
1750.0000 mg | Freq: Once | INTRAVENOUS | Status: AC
  Administered 2024-01-04: 1750 mg via INTRAVENOUS
  Filled 2024-01-03: qty 350

## 2024-01-03 MED ORDER — METRONIDAZOLE 500 MG/100ML IV SOLN
500.0000 mg | Freq: Once | INTRAVENOUS | Status: AC
  Administered 2024-01-04: 500 mg via INTRAVENOUS
  Filled 2024-01-03: qty 100

## 2024-01-03 NOTE — ED Triage Notes (Signed)
 BIBA EMS from home, family called for swelling in feet and SOB. Released from nursing home 2 weeks ago. Patient's family states that he was more confused than normal. Ems states patient was hot to touch and tachypnea. Hx of Afib just got off Warfarin. Lung were clear. EMS states meets Sepsis criteria.    Vitals:  Temp: 101.2 F 20 G LAC infusing LR

## 2024-01-03 NOTE — ED Provider Notes (Signed)
 [facility] AT [facility] Provider Note   CSN: [id number] Arrival date & time: 01/03/24  2327     Patient presents with: Shortness of Breath   Matthew Edwards is a 83 y.o. male.   83 year old male with history of dementia and CHF reportedly.  Presents to the ER today with EMS secondary to dyspnea.  History obtained from EMS as patient is not able to offer much however family is and route some to get more information.  Patient was recently released from [facility] about a week ago.  EMS was called out secondary to lower extremity swelling and shortness of breath and not acting like himself.  He is normally alert and oriented x 3 and he is alert and oriented x 2.  He was hot to touch and had a temperature 101.2 with EMS.  He is on his baseline oxygen  but is tachypneic.  Baseline A-fib rhythm.  No longer on Coumadin  reportedly.   Shortness of Breath      Prior to Admission medications   Medication Sig Start Date End Date Taking? Authorizing Provider  acetaminophen  (TYLENOL ) 500 MG tablet Take 500 mg by mouth 2 (two) times daily.   Yes [provider]  ascorbic acid  (VITAMIN C ) 500 MG tablet Take 500 mg by mouth daily. 11/05/23  Yes [provider]  aspirin  325 MG tablet Take 325 mg by mouth daily. 12/25/23  Yes [provider]  donepezil  (ARICEPT ) 5 MG tablet Take 5 mg by mouth daily. 12/17/23  Yes [provider]  feeding supplement (ENSURE ENLIVE / ENSURE PLUS) LIQD Take 237 mLs by mouth 2 (two) times daily between meals. 10/18/23  Yes Cleotilde Perkins, DO  fluticasone -salmeterol (ADVAIR) 250-50 MCG/ACT AEPB Inhale 1 puff into the lungs 2 (two) times daily. 11/12/23  Yes [provider]  furosemide  (LASIX ) 20 MG tablet Take 1 tablet (20 mg total) by mouth daily. 11/30/23 01/03/25 Yes Arrien, Elidia Sieving, MD  gabapentin  (NEURONTIN ) 100 MG capsule TAKE 1 CAPSULE BY MOUTH AT  BEDTIME 09/15/21  Yes Chambliss, Layman CROME, MD   lisinopril  (ZESTRIL ) 5 MG tablet Take 1 tablet (5 mg total) by mouth daily. 12/01/23 01/03/25 Yes Arrien, Mauricio Daniel, MD  Multiple Vitamin (MULTIVITAMIN WITH MINERALS) TABS tablet Take 1 tablet by mouth daily.   Yes [provider]  QUEtiapine  (SEROQUEL ) 50 MG tablet Take 50 mg by mouth daily. 03/05/23  Yes [provider]  tiZANidine  (ZANAFLEX ) 2 MG tablet Take 2 mg by mouth at bedtime. 11/12/23  Yes [provider]  nitroGLYCERIN  (NITROSTAT ) 0.4 MG SL tablet Place 0.4 mg under the tongue every 5 (five) minutes as needed for chest pain. 06/15/23   [provider]  spironolactone  (ALDACTONE ) 25 MG tablet Take 0.5 tablets (12.5 mg total) by mouth daily. 12/01/23   Arrien, Mauricio Daniel, MD  warfarin (COUMADIN ) 5 MG tablet Take 5 mg by mouth daily. Patient not taking: Reported on 01/04/2024    [provider]    Allergies: Pineapple concentrate    Review of Systems  Respiratory:  Positive for shortness of breath.     Updated Vital Signs BP (!) 142/90 (BP Location: Right Arm)   Pulse 96   Temp 98.9 F (37.2 C) (Oral)   Resp (!) 22   Ht 5' 9 (1.753 m)   Wt 75.8 kg   SpO2 98%   BMI 24.68 kg/m   Physical Exam Vitals and nursing note reviewed.  Constitutional:  Appearance: He is well-developed.  HENT:     Head: Normocephalic and atraumatic.  Neck:     Vascular: JVD present.  Cardiovascular:     Rate and Rhythm: Rhythm irregular.  Pulmonary:     Effort: Pulmonary effort is normal. No respiratory distress.     Breath sounds: Decreased breath sounds and wheezing present.  Abdominal:     General: There is no distension.  Musculoskeletal:        General: Normal range of motion.     Cervical back: Normal range of motion.     Right lower leg: Edema present.     Left lower leg: Edema present.  Skin:    General: Skin is warm and dry.  Neurological:     Mental Status: He is alert. He is disoriented.     (all labs ordered are  listed, but only abnormal results are displayed) Labs Reviewed  BRAIN NATRIURETIC PEPTIDE - Abnormal; Notable for the following components:      Result Value   B Natriuretic Peptide 228.2 (*)    All other components within normal limits  COMPREHENSIVE METABOLIC PANEL WITH GFR - Abnormal; Notable for the following components:   Glucose, Bld 165 (*)    Total Protein 6.2 (*)    Albumin  2.8 (*)    All other components within normal limits  CBC WITH DIFFERENTIAL/PLATELET - Abnormal; Notable for the following components:   RBC 3.73 (*)    Hemoglobin 9.9 (*)    HCT 33.4 (*)    MCHC 29.6 (*)    RDW 16.5 (*)    All other components within normal limits  PROTIME-INR - Abnormal; Notable for the following components:   Prothrombin Time 29.5 (*)    INR 2.6 (*)    All other components within normal limits  URINALYSIS, W/ REFLEX TO CULTURE (INFECTION SUSPECTED) - Abnormal; Notable for the following components:   Bacteria, UA RARE (*)    All other components within normal limits  LACTATE DEHYDROGENASE - Abnormal; Notable for the following components:   LDH 434 (*)    All other components within normal limits  CBC - Abnormal; Notable for the following components:   RBC 4.16 (*)    Hemoglobin 11.0 (*)    HCT 37.4 (*)    MCHC 29.4 (*)    RDW 16.3 (*)    All other components within normal limits  BASIC METABOLIC PANEL WITH GFR - Abnormal; Notable for the following components:   CO2 33 (*)    All other components within normal limits  LACTATE DEHYDROGENASE, PLEURAL OR PERITONEAL FLUID - Abnormal; Notable for the following components:   LD, Fluid 615 (*)    All other components within normal limits  BODY FLUID CELL COUNT WITH DIFFERENTIAL - Abnormal; Notable for the following components:   Color, Fluid RED (*)    Appearance, Fluid BLOODY (*)    Neutrophil Count, Fluid 64 (*)    Monocyte-Macrophage-Serous Fluid 3 (*)    All other components within normal limits  BASIC METABOLIC PANEL WITH GFR  - Abnormal; Notable for the following components:   CO2 33 (*)    Glucose, Bld 122 (*)    All other components within normal limits  CBC - Abnormal; Notable for the following components:   RBC 4.04 (*)    Hemoglobin 10.6 (*)    HCT 36.1 (*)    MCHC 29.4 (*)    RDW 16.3 (*)    All other components within normal limits  I-STAT CG4 LACTIC ACID, ED - Abnormal; Notable for the following components:   Lactic Acid, Venous 2.7 (*)    All other components within normal limits  I-STAT VENOUS BLOOD GAS, ED - Abnormal; Notable for the following components:   pH, Ven 7.447 (*)    pO2, Ven 83 (*)    Bicarbonate 30.4 (*)    Acid-Base Excess 6.0 (*)    HCT 33.0 (*)    Hemoglobin 11.2 (*)    All other components within normal limits  RESP PANEL BY RT-PCR (RSV, FLU A&B, COVID)  RVPGX2  CULTURE, BLOOD (ROUTINE X 2)  CULTURE, BLOOD (ROUTINE X 2)  RESPIRATORY PANEL BY PCR  BODY FLUID CULTURE W GRAM STAIN  URINE CULTURE  AMMONIA  PROCALCITONIN  PROTEIN, PLEURAL OR PERITONEAL FLUID  GLUCOSE, PLEURAL OR PERITONEAL FLUID  MISC [facility] TEST (SEND OUT)  I-STAT CG4 LACTIC ACID, ED  CYTOLOGY - NON PAP  TROPONIN I (HIGH SENSITIVITY)  TROPONIN I (HIGH SENSITIVITY)    EKG: EKG Interpretation Date/Time:  Tuesday January 04 2024 02:01:33 EDT Ventricular Rate:  102 PR Interval:    QRS Duration:  92 QT Interval:  353 QTC Calculation: 460 R Axis:   75  Text Interpretation: Atrial fibrillation Ventricular premature complex Minimal ST depression, anterolateral leads When compared with ECG of 11/28/2023, No significant change was found Confirmed by Raford Lenis ([id number]) on 01/04/2024 4:34:48 AM  Radiology: IR THORACENTESIS ASP PLEURAL SPACE W/IMG GUIDE Result Date: 01/04/2024 INDICATION: 83 year old male with history of heart failure, with recurrent right-sided pleural effusion. IR was requested for diagnostic and therapeutic thoracentesis. EXAM: ULTRASOUND GUIDED DIAGNOSTIC AND THERAPEUTIC THORACENTESIS  MEDICATIONS: 6 cc of 1% lidocaine  with epi. COMPLICATIONS: None immediate. PROCEDURE: An ultrasound guided thoracentesis was thoroughly discussed with the patient and questions answered. The benefits, risks, alternatives and complications were also discussed. The patient understands and wishes to proceed with the procedure. Written consent was obtained. Ultrasound was performed to localize and mark an adequate pocket of fluid in the right chest. The area was then prepped and draped in the normal sterile fashion. 1% Lidocaine  was used for local anesthesia. Under ultrasound guidance a 6 Fr Safe-T-Centesis catheter was introduced. Thoracentesis was performed. The catheter was removed and a dressing applied. FINDINGS: A total of approximately 2.0 L of clear, blood-laden pleural fluid was removed. Samples were sent to the laboratory as requested by the clinical team. IMPRESSION: Successful ultrasound guided right thoracentesis yielding 2.0 of pleural fluid. Procedure performed by Carlin Griffon, PA-C Electronically Signed   By: Ester Sides M.D.   On: 01/04/2024 11:36   DG Chest 1 View Result Date: 01/04/2024 CLINICAL DATA:  Post thoracentesis. EXAM: CHEST  1 VIEW COMPARISON:  Radiographs 01/03/2024 and 11/29/2023. FINDINGS: 0926 hours. Interval decreased right pleural effusion following thoracentesis. There is a small residual right pleural effusion with mild residual right basilar atelectasis. No pneumothorax. There is stable vascular congestion with mild atelectasis at the left lung base. The heart size and mediastinal contours are stable post median sternotomy and probable aortic valve replacement. No acute osseous findings are seen. IMPRESSION: Interval decreased right pleural effusion following thoracentesis. No pneumothorax. Electronically Signed   By: Elsie Perone M.D.   On: 01/04/2024 10:11   CT HEAD WO CONTRAST ( ) Result Date: 01/04/2024 EXAM: CT HEAD WITHOUT 01/04/2024 06:37:13 AM TECHNIQUE: CT of  the head was performed without the administration of intravenous contrast. Automated exposure control, iterative reconstruction, and/or weight based adjustment of the mA/kV was utilized to reduce  the radiation dose to as low as reasonably achievable. COMPARISON: CT head without contrast 10/12/2023 and 09/25/2023. CLINICAL HISTORY: Delirium. FINDINGS: BRAIN AND VENTRICLES: No acute intracranial hemorrhage. No mass effect or midline shift. No extra-axial fluid collection. Gray-white differentiation is maintained. No hydrocephalus. Periventricular and subcortical white matter hypoattenuation is mildly advanced for age. Mild diffuse atrophy is stable. ORBITS: No acute abnormality. SINUSES AND MASTOIDS: No acute abnormality. SOFT TISSUES AND SKULL: No acute skull fracture. No acute soft tissue abnormality. VASCULATURE: Atherosclerotic calcifications are present in the cavernous carotid arteries bilaterally and at the dural margin of both vertebral arteries. No hyperdense vessel is present. IMPRESSION: 1. No acute intracranial abnormality. 2. Mild diffuse atrophy, stable. 3. Mildly advanced periventricular and subcortical white matter hypoattenuation for age, stable. Electronically signed by: Lonni Necessary MD 01/04/2024 06:44 AM EDT RP Workstation: [id number]   DG Chest Port 1 View Result Date: 01/03/2024 CLINICAL DATA:  Shortness of breath. EXAM: PORTABLE CHEST 1 VIEW COMPARISON:  11/29/2023 FINDINGS: Moderate to large right pleural effusion, passing the lower 2/3 of right hemithorax. Prior median sternotomy with prosthetic cardiac valve. Heart size is obscured by right pleural effusion. Vascular congestion. No pneumothorax. IMPRESSION: 1. Moderate to large right pleural effusion. 2. Vascular congestion. Electronically Signed   By: Andrea Gasman M.D.   On: 01/03/2024 23:44     .Critical Care  Performed by: Lorette Mayo, MD Authorized by: Lorette Mayo, MD   Critical care provider statement:     Critical care time (minutes):  30   Critical care was necessary to treat or prevent imminent or life-threatening deterioration of the following conditions:  Respiratory failure   Critical care was time spent personally by me on the following activities:  Development of treatment plan with patient or surrogate, discussions with consultants, evaluation of patient's response to treatment, examination of patient, ordering and review of laboratory studies, ordering and review of radiographic studies, ordering and performing treatments and interventions, pulse oximetry, re-evaluation of patient's condition and review of old charts    Medications Ordered in the ED  metroNIDAZOLE  (FLAGYL ) IVPB 500 mg (500 mg Intravenous New Bag/Given 01/04/24 2241)  acetaminophen  (TYLENOL ) tablet 650 mg (has no administration in time range)    Or  acetaminophen  (TYLENOL ) suppository 650 mg (has no administration in time range)  metoprolol  tartrate (LOPRESSOR ) injection 5 mg (has no administration in time range)  levalbuterol  (XOPENEX ) nebulizer solution 0.63 mg (0.63 mg Nebulization Given 01/05/24 0524)  ceFEPIme  (MAXIPIME ) 2 g in sodium chloride  0.9 % 100 mL IVPB (2 g Intravenous New Bag/Given 01/05/24 0500)  vancomycin  (VANCOREADY) IVPB 1250 mg/250 mL (1,250 mg Intravenous New Bag/Given 01/05/24 0541)  fluticasone  furoate-vilanterol (BREO ELLIPTA ) 200-25 MCG/ACT 1 puff (1 puff Inhalation Not Given 01/04/24 0731)  gabapentin  (NEURONTIN ) capsule 100 mg (100 mg Oral Given 01/04/24 2228)  furosemide  (LASIX ) tablet 20 mg (has no administration in time range)  QUEtiapine  (SEROQUEL ) tablet 50 mg (50 mg Oral Given 01/04/24 2228)  ceFEPIme  (MAXIPIME ) 2 g in sodium chloride  0.9 % 100 mL IVPB (0 g Intravenous Stopped 01/04/24 0142)  metroNIDAZOLE  (FLAGYL ) IVPB 500 mg (0 mg Intravenous Stopped 01/04/24 0225)  vancomycin  (VANCOREADY) IVPB 1750 mg/350 mL (0 mg Intravenous Stopped 01/04/24 0547)  furosemide  (LASIX ) injection 40 mg (40 mg Intravenous  Given by Other 01/04/24 0330)  lidocaine -EPINEPHrine  (XYLOCAINE  W/EPI) 1 %-1:100000 (with pres) injection 20 mL (10 mLs Intradermal Given 01/04/24 0919)  Medical Decision Making Amount and/or Complexity of Data Reviewed Labs: ordered. Radiology: ordered.  Risk Prescription drug management. Decision regarding hospitalization.  Sepsis initiated for RR and fever with EMS, VS not yet documented today.  Sepsis canceled as he is afebrile here and symptoms seem more c/w CHF. Diuresed. Still significantly dyspneic even after good UOP.      Final diagnoses:  Pleural effusion    ED Discharge Orders     None          Torrin Crihfield, Selinda, MD 01/05/24 [phone]

## 2024-01-03 NOTE — Sepsis Progress Note (Signed)
 Elink following for sepsis protocol.

## 2024-01-04 ENCOUNTER — Inpatient Hospital Stay (HOSPITAL_COMMUNITY)

## 2024-01-04 ENCOUNTER — Other Ambulatory Visit: Payer: Self-pay

## 2024-01-04 ENCOUNTER — Encounter (HOSPITAL_COMMUNITY): Payer: Self-pay

## 2024-01-04 DIAGNOSIS — I4821 Permanent atrial fibrillation: Secondary | ICD-10-CM

## 2024-01-04 DIAGNOSIS — J9621 Acute and chronic respiratory failure with hypoxia: Secondary | ICD-10-CM | POA: Diagnosis present

## 2024-01-04 DIAGNOSIS — E1165 Type 2 diabetes mellitus with hyperglycemia: Secondary | ICD-10-CM | POA: Diagnosis present

## 2024-01-04 DIAGNOSIS — A419 Sepsis, unspecified organism: Secondary | ICD-10-CM | POA: Diagnosis present

## 2024-01-04 DIAGNOSIS — F17210 Nicotine dependence, cigarettes, uncomplicated: Secondary | ICD-10-CM | POA: Diagnosis present

## 2024-01-04 DIAGNOSIS — I359 Nonrheumatic aortic valve disorder, unspecified: Secondary | ICD-10-CM

## 2024-01-04 DIAGNOSIS — Z7189 Other specified counseling: Secondary | ICD-10-CM | POA: Diagnosis not present

## 2024-01-04 DIAGNOSIS — I4819 Other persistent atrial fibrillation: Secondary | ICD-10-CM

## 2024-01-04 DIAGNOSIS — E872 Acidosis, unspecified: Secondary | ICD-10-CM | POA: Diagnosis present

## 2024-01-04 DIAGNOSIS — F0390 Unspecified dementia without behavioral disturbance: Secondary | ICD-10-CM

## 2024-01-04 DIAGNOSIS — Z515 Encounter for palliative care: Secondary | ICD-10-CM | POA: Diagnosis not present

## 2024-01-04 DIAGNOSIS — Z953 Presence of xenogenic heart valve: Secondary | ICD-10-CM | POA: Diagnosis not present

## 2024-01-04 DIAGNOSIS — E1142 Type 2 diabetes mellitus with diabetic polyneuropathy: Secondary | ICD-10-CM | POA: Diagnosis present

## 2024-01-04 DIAGNOSIS — Z1152 Encounter for screening for COVID-19: Secondary | ICD-10-CM | POA: Diagnosis not present

## 2024-01-04 DIAGNOSIS — J90 Pleural effusion, not elsewhere classified: Secondary | ICD-10-CM | POA: Diagnosis not present

## 2024-01-04 DIAGNOSIS — I272 Pulmonary hypertension, unspecified: Secondary | ICD-10-CM | POA: Diagnosis present

## 2024-01-04 DIAGNOSIS — I10 Essential (primary) hypertension: Secondary | ICD-10-CM

## 2024-01-04 DIAGNOSIS — R627 Adult failure to thrive: Secondary | ICD-10-CM | POA: Diagnosis present

## 2024-01-04 DIAGNOSIS — Z952 Presence of prosthetic heart valve: Secondary | ICD-10-CM

## 2024-01-04 DIAGNOSIS — G9341 Metabolic encephalopathy: Secondary | ICD-10-CM | POA: Diagnosis present

## 2024-01-04 DIAGNOSIS — I110 Hypertensive heart disease with heart failure: Secondary | ICD-10-CM | POA: Diagnosis present

## 2024-01-04 DIAGNOSIS — F0394 Unspecified dementia, unspecified severity, with anxiety: Secondary | ICD-10-CM | POA: Diagnosis present

## 2024-01-04 DIAGNOSIS — R1310 Dysphagia, unspecified: Secondary | ICD-10-CM | POA: Diagnosis present

## 2024-01-04 DIAGNOSIS — D649 Anemia, unspecified: Secondary | ICD-10-CM | POA: Diagnosis present

## 2024-01-04 DIAGNOSIS — E785 Hyperlipidemia, unspecified: Secondary | ICD-10-CM | POA: Diagnosis present

## 2024-01-04 DIAGNOSIS — Z794 Long term (current) use of insulin: Secondary | ICD-10-CM | POA: Diagnosis not present

## 2024-01-04 DIAGNOSIS — F03B0 Unspecified dementia, moderate, without behavioral disturbance, psychotic disturbance, mood disturbance, and anxiety: Secondary | ICD-10-CM | POA: Diagnosis not present

## 2024-01-04 DIAGNOSIS — I5033 Acute on chronic diastolic (congestive) heart failure: Secondary | ICD-10-CM

## 2024-01-04 DIAGNOSIS — G9340 Encephalopathy, unspecified: Secondary | ICD-10-CM | POA: Diagnosis not present

## 2024-01-04 DIAGNOSIS — R55 Syncope and collapse: Secondary | ICD-10-CM | POA: Diagnosis present

## 2024-01-04 DIAGNOSIS — I5032 Chronic diastolic (congestive) heart failure: Secondary | ICD-10-CM

## 2024-01-04 DIAGNOSIS — Z66 Do not resuscitate: Secondary | ICD-10-CM | POA: Diagnosis not present

## 2024-01-04 DIAGNOSIS — J449 Chronic obstructive pulmonary disease, unspecified: Secondary | ICD-10-CM | POA: Diagnosis present

## 2024-01-04 DIAGNOSIS — I2510 Atherosclerotic heart disease of native coronary artery without angina pectoris: Secondary | ICD-10-CM

## 2024-01-04 DIAGNOSIS — J918 Pleural effusion in other conditions classified elsewhere: Secondary | ICD-10-CM | POA: Diagnosis present

## 2024-01-04 LAB — I-STAT VENOUS BLOOD GAS, ED
Acid-Base Excess: 6 mmol/L — ABNORMAL HIGH (ref 0.0–2.0)
Bicarbonate: 30.4 mmol/L — ABNORMAL HIGH (ref 20.0–28.0)
Calcium, Ion: 1.17 mmol/L (ref 1.15–1.40)
HCT: 33 % — ABNORMAL LOW (ref 39.0–52.0)
Hemoglobin: 11.2 g/dL — ABNORMAL LOW (ref 13.0–17.0)
O2 Saturation: 97 %
Potassium: 3.7 mmol/L (ref 3.5–5.1)
Sodium: 140 mmol/L (ref 135–145)
TCO2: 32 mmol/L (ref 22–32)
pCO2, Ven: 44 mmHg (ref 44–60)
pH, Ven: 7.447 — ABNORMAL HIGH (ref 7.25–7.43)
pO2, Ven: 83 mmHg — ABNORMAL HIGH (ref 32–45)

## 2024-01-04 LAB — COMPREHENSIVE METABOLIC PANEL WITH GFR
ALT: 18 U/L (ref 0–44)
AST: 25 U/L (ref 15–41)
Albumin: 2.8 g/dL — ABNORMAL LOW (ref 3.5–5.0)
Alkaline Phosphatase: 78 U/L (ref 38–126)
Anion gap: 10 (ref 5–15)
BUN: 15 mg/dL (ref 8–23)
CO2: 29 mmol/L (ref 22–32)
Calcium: 9.1 mg/dL (ref 8.9–10.3)
Chloride: 101 mmol/L (ref 98–111)
Creatinine, Ser: 1.1 mg/dL (ref 0.61–1.24)
GFR, Estimated: >60 mL/min (ref >60)
Glucose, Bld: 165 mg/dL — ABNORMAL HIGH (ref 70–99)
Potassium: 3.8 mmol/L (ref 3.5–5.1)
Sodium: 140 mmol/L (ref 135–145)
Total Bilirubin: 0.7 mg/dL (ref 0.0–1.2)
Total Protein: 6.2 g/dL — ABNORMAL LOW (ref 6.5–8.1)

## 2024-01-04 LAB — RESPIRATORY PANEL BY PCR

## 2024-01-04 LAB — URINALYSIS, W/ REFLEX TO CULTURE (INFECTION SUSPECTED)
Bilirubin Urine: NEGATIVE
Glucose, UA: NEGATIVE mg/dL
Hgb urine dipstick: NEGATIVE
Ketones, ur: NEGATIVE mg/dL
Leukocytes,Ua: NEGATIVE
Nitrite: NEGATIVE
Protein, ur: NEGATIVE mg/dL
Specific Gravity, Urine: 1.013 (ref 1.005–1.030)
pH: 5 (ref 5.0–8.0)

## 2024-01-04 LAB — LACTATE DEHYDROGENASE, PLEURAL OR PERITONEAL FLUID: LD, Fluid: 615 U/L — ABNORMAL HIGH (ref 3–23)

## 2024-01-04 LAB — TROPONIN I (HIGH SENSITIVITY)
Troponin I (High Sensitivity): 10 ng/L (ref <18)
Troponin I (High Sensitivity): 8 ng/L (ref <18)

## 2024-01-04 LAB — CBC WITH DIFFERENTIAL/PLATELET
Abs Immature Granulocytes: 0.02 K/uL (ref 0.00–0.07)
Basophils Absolute: 0 K/uL (ref 0.0–0.1)
Basophils Relative: 0 %
Eosinophils Absolute: 0.1 K/uL (ref 0.0–0.5)
Eosinophils Relative: 1 %
HCT: 33.4 % — ABNORMAL LOW (ref 39.0–52.0)
Hemoglobin: 9.9 g/dL — ABNORMAL LOW (ref 13.0–17.0)
Immature Granulocytes: 0 %
Lymphocytes Relative: 11 %
Lymphs Abs: 0.7 K/uL (ref 0.7–4.0)
MCH: 26.5 pg (ref 26.0–34.0)
MCHC: 29.6 g/dL — ABNORMAL LOW (ref 30.0–36.0)
MCV: 89.5 fL (ref 80.0–100.0)
Monocytes Absolute: 0.3 K/uL (ref 0.1–1.0)
Monocytes Relative: 5 %
Neutro Abs: 5.5 K/uL (ref 1.7–7.7)
Neutrophils Relative %: 83 %
Platelets: 160 K/uL (ref 150–400)
RBC: 3.73 MIL/uL — ABNORMAL LOW (ref 4.22–5.81)
RDW: 16.5 % — ABNORMAL HIGH (ref 11.5–15.5)
WBC: 6.6 K/uL (ref 4.0–10.5)
nRBC: 0 % (ref 0.0–0.2)

## 2024-01-04 LAB — BASIC METABOLIC PANEL WITH GFR
Anion gap: 9 (ref 5–15)
BUN: 13 mg/dL (ref 8–23)
CO2: 33 mmol/L — ABNORMAL HIGH (ref 22–32)
Calcium: 9 mg/dL (ref 8.9–10.3)
Chloride: 99 mmol/L (ref 98–111)
Creatinine, Ser: 0.91 mg/dL (ref 0.61–1.24)
GFR, Estimated: >60 mL/min (ref >60)
Glucose, Bld: 97 mg/dL (ref 70–99)
Potassium: 4.2 mmol/L (ref 3.5–5.1)
Sodium: 141 mmol/L (ref 135–145)

## 2024-01-04 LAB — BODY FLUID CELL COUNT WITH DIFFERENTIAL
Eos, Fluid: 0 %
Lymphs, Fluid: 33 %
Monocyte-Macrophage-Serous Fluid: 3 % — ABNORMAL LOW (ref 50–90)
Neutrophil Count, Fluid: 64 % — ABNORMAL HIGH (ref 0–25)
Total Nucleated Cell Count, Fluid: 660 uL (ref 0–1000)

## 2024-01-04 LAB — PROTIME-INR
INR: 2.6 — ABNORMAL HIGH (ref 0.8–1.2)
Prothrombin Time: 29.5 s — ABNORMAL HIGH (ref 11.4–15.2)

## 2024-01-04 LAB — CBC
HCT: 37.4 % — ABNORMAL LOW (ref 39.0–52.0)
Hemoglobin: 11 g/dL — ABNORMAL LOW (ref 13.0–17.0)
MCH: 26.4 pg (ref 26.0–34.0)
MCHC: 29.4 g/dL — ABNORMAL LOW (ref 30.0–36.0)
MCV: 89.9 fL (ref 80.0–100.0)
Platelets: 163 K/uL (ref 150–400)
RBC: 4.16 MIL/uL — ABNORMAL LOW (ref 4.22–5.81)
RDW: 16.3 % — ABNORMAL HIGH (ref 11.5–15.5)
WBC: 8.6 K/uL (ref 4.0–10.5)
nRBC: 0 % (ref 0.0–0.2)

## 2024-01-04 LAB — PROTEIN, PLEURAL OR PERITONEAL FLUID: Total protein, fluid: 3.7 g/dL

## 2024-01-04 LAB — BRAIN NATRIURETIC PEPTIDE: B Natriuretic Peptide: 228.2 pg/mL — ABNORMAL HIGH (ref 0.0–100.0)

## 2024-01-04 LAB — PROCALCITONIN: Procalcitonin: <0.1 ng/mL

## 2024-01-04 LAB — RESP PANEL BY RT-PCR (RSV, FLU A&B, COVID)  RVPGX2
Influenza A by PCR: NEGATIVE
Influenza B by PCR: NEGATIVE
Resp Syncytial Virus by PCR: NEGATIVE
SARS Coronavirus 2 by RT PCR: NEGATIVE

## 2024-01-04 LAB — AMMONIA: Ammonia: 32 umol/L (ref 9–35)

## 2024-01-04 LAB — I-STAT CG4 LACTIC ACID, ED: Lactic Acid, Venous: 1.2 mmol/L (ref 0.5–1.9)

## 2024-01-04 LAB — GLUCOSE, PLEURAL OR PERITONEAL FLUID: Glucose, Fluid: 121 mg/dL

## 2024-01-04 LAB — LACTATE DEHYDROGENASE: LDH: 434 U/L — ABNORMAL HIGH (ref 98–192)

## 2024-01-04 MED ORDER — ACETAMINOPHEN 325 MG PO TABS: 650.0000 mg | ORAL_TABLET | Freq: Four times a day (QID) | ORAL | Status: DC | PRN

## 2024-01-04 MED ORDER — FUROSEMIDE 10 MG/ML IJ SOLN
40.0000 mg | Freq: Once | INTRAMUSCULAR | Status: AC
  Administered 2024-01-04: 40 mg via INTRAVENOUS
  Filled 2024-01-04: qty 4

## 2024-01-04 MED ORDER — ACETAMINOPHEN 650 MG RE SUPP: 650.0000 mg | Freq: Four times a day (QID) | RECTAL | Status: DC | PRN

## 2024-01-04 MED ORDER — QUETIAPINE FUMARATE 50 MG PO TABS
50.0000 mg | ORAL_TABLET | Freq: Every day | ORAL | Status: DC
  Administered 2024-01-04: 50 mg via ORAL
  Filled 2024-01-04 (×2): qty 1

## 2024-01-04 MED ORDER — VANCOMYCIN HCL 1250 MG/250ML IV SOLN
1250.0000 mg | INTRAVENOUS | Status: DC
  Administered 2024-01-05 – 2024-01-06 (×2): 1250 mg via INTRAVENOUS
  Filled 2024-01-04 (×2): qty 250

## 2024-01-04 MED ORDER — GABAPENTIN 100 MG PO CAPS
100.0000 mg | ORAL_CAPSULE | Freq: Every day | ORAL | Status: DC
  Administered 2024-01-04 – 2024-01-06 (×3): 100 mg via ORAL
  Filled 2024-01-04 (×3): qty 1

## 2024-01-04 MED ORDER — METRONIDAZOLE 500 MG/100ML IV SOLN
500.0000 mg | Freq: Two times a day (BID) | INTRAVENOUS | Status: DC
  Administered 2024-01-04 – 2024-01-06 (×5): 500 mg via INTRAVENOUS
  Filled 2024-01-04 (×5): qty 100

## 2024-01-04 MED ORDER — METOPROLOL TARTRATE 5 MG/5ML IV SOLN
5.0000 mg | Freq: Four times a day (QID) | INTRAVENOUS | Status: DC | PRN
  Administered 2024-01-06: 5 mg via INTRAVENOUS
  Filled 2024-01-04: qty 5

## 2024-01-04 MED ORDER — SODIUM CHLORIDE 0.9 % IV SOLN
2.0000 g | Freq: Two times a day (BID) | INTRAVENOUS | Status: DC
  Administered 2024-01-04 – 2024-01-06 (×4): 2 g via INTRAVENOUS
  Filled 2024-01-04 (×4): qty 12.5

## 2024-01-04 MED ORDER — FLUTICASONE FUROATE-VILANTEROL 200-25 MCG/ACT IN AEPB
1.0000 | INHALATION_SPRAY | Freq: Every day | RESPIRATORY_TRACT | Status: DC
  Administered 2024-01-05 – 2024-01-06 (×2): 1 via RESPIRATORY_TRACT
  Filled 2024-01-04: qty 28

## 2024-01-04 MED ORDER — LIDOCAINE-EPINEPHRINE 1 %-1:100000 IJ SOLN
20.0000 mL | Freq: Once | INTRAMUSCULAR | Status: AC
  Administered 2024-01-04: 10 mL via INTRADERMAL

## 2024-01-04 MED ORDER — LEVALBUTEROL HCL 0.63 MG/3ML IN NEBU
0.6300 mg | INHALATION_SOLUTION | Freq: Four times a day (QID) | RESPIRATORY_TRACT | Status: DC | PRN
  Administered 2024-01-05: 0.63 mg via RESPIRATORY_TRACT
  Filled 2024-01-04 (×2): qty 3

## 2024-01-04 MED ORDER — LIDOCAINE-EPINEPHRINE 1 %-1:100000 IJ SOLN
INTRAMUSCULAR | Status: AC
  Filled 2024-01-04: qty 1

## 2024-01-04 MED ORDER — FUROSEMIDE 20 MG PO TABS
20.0000 mg | ORAL_TABLET | Freq: Every day | ORAL | Status: DC
  Administered 2024-01-05 – 2024-01-06 (×2): 20 mg via ORAL
  Filled 2024-01-04 (×3): qty 1

## 2024-01-04 NOTE — ED Notes (Signed)
 Pt transported to IR

## 2024-01-04 NOTE — ED Notes (Signed)
 CCMD called.

## 2024-01-04 NOTE — Hospital Course (Addendum)
 83 year old male with chronic HFpEF, pulmonary hypertension, hyperlipidemia, hypertension, persistent A-fib, CAD status post CABG in 2012, diet controlled type 2 diabetes, COPD, chronic hypoxemic respiratory failure on 2 L home oxygen , dementia, OSA, aortic stenosis status post bioprosthetic AVR in 2012, cardiac fibroelastoma. Recently admitted 6/28-12/04/2023 for CHF exacerbation and released from SNF a wk ago present shortness of breath bilateral lower leg edema and altered mental status/confusion worse from baseline. Seen in the ED: Febrile up to 101.2, tachycardic tachypneic needing 4 L nasal cannula,Labs showing no leukocytosis, hemoglobin 9.9 (baseline 11-12), glucose 165, creatinine 1.1, albumin  2.8, troponin negative x 2, BNP 228, INR 2.6, blood cultures in process, ammonia level normal, lactic acid 2.7> 1.2, blood gas with pH 7.44 and PCO2 44, COVID/influenza/RSV PCR negative, UA not suggestive of infection. CXR>>vascular congestion and moderate to large right pleural effusion. Patient was given IV Lasix  40 mg, vancomycin , cefepime , metronidazole , and IV fluids and admitted for acute on chronic HFpEF recurrent moderate to large pleural effusion and sepsis with persistent A-fib with RVR. Seen by cardiology subsequently transition to oral diuretics Palliative care was consulted unfortunately remains full code he is high risk for readmissions and decompensation family is aware about poor prognosis and wants to continue full code for now, they would like to look into SNF and long-term placement  Last echo done on 6/30, treated w/ IV Lasix  [place], and s/p thoracentesis 2L fluid removed> LDH 615 although bloody appearance neutrophil 64 with total protein 3.7By lights criteria exudative as pleural fluid LDH/Serum LHD: 615/434 is 1.4 (is > 0.6), Pleural fluid protein/Serum protein: 3.7/6.2 is is 0.59 (as > 0.5). as such no pneumonia on xray, so far Blood cx ngtd, pleural fluid cx NGTD. No evidence of UTI COVID  respiratory virus panel and influenza negative Pro-Cal negative. He remains at high risk of aspiration w/ his baseline dementia On antibiotics since 01/04/24 ON 2024/01/11 AM Patient had rapid decline with respiratory distress, early morning reading, with audible crackles from distant, in the setting of acute on chronic CHF and recurrent pleural effusion, and remain at high risk for aspiration. Given his significant dementia, also with encephalopathy and failure to thrive palliative care had further discussed and family opted to transition to comfort measures and patient peacefully passed away 01-11-2024  Discharge diagnosis :  Acute on chronic HFpEF/RV failure Recurrent moderate to large pleural effusion-exudative Acute on chronic hypoxic respiratory failure due to CHF and pleural effusion Possible sepsis POA Acute respiratory distress 01/11/2024 Acute metabolic encephalopathy Dementia Adult failure to thrive Dysphagia-At high isk of aspiration Persistent A-fib with RVR Chronic normocytic anemia: CAD CABG Status post AVR in 2012 Mild MR: T2DM: COPD: Goals of care Deconditioning/debility: Follow-up After extensive discussion family opted for palliative approach with comfort measures and patient peacefully passed away Jan 11, 2024

## 2024-01-04 NOTE — Procedures (Signed)
 PROCEDURE SUMMARY:  Successful image-guided right-sided diagnostic and therapeutic thoracentesis. Yielded 2.0 liters of clear, blood-laden pleural fluid. Patient tolerated procedure well. EBL: Zero No immediate complications.  Specimen was sent for labs. Post procedure CXR is pending.  Please see imaging section of Epic for full dictation.  Carlin LABOR Kenesha Moshier PA-C 01/04/2024 9:23 AM

## 2024-01-04 NOTE — Progress Notes (Addendum)
 Pharmacy Antibiotic Note  Matthew Edwards is a 83 y.o. male admitted on 01/03/2024 with SOB and swelling in feet.  Pharmacy has been consulted for cefepime /vancomycin  dosing for sepsis.  -Tmax 101.2, WBC WNL, lactate WNL, sCr 1.1 -1st doses of antibiotics given -Blood cultures collected, resp panel for flu/covid negative  Plan: -Cefepime  2g IV every 12 hours -Flagyl  500mg  IV every 12 hours -Vancomycin  1750mg  IV x1 -Vancomycin  1250mg  IV every 24 hours (AUC 454, Vd 0.72, IBW, sCr 1.1) -Monitor renal function -Follow up signs of clinical improvement, LOT, de-escalation of antibiotics   Height: 5' 9 (175.3 cm) Weight: 78.2 kg (172 lb 6.4 oz) IBW/kg (Calculated) : 70.7  Temp (24hrs), Avg:98 F (36.7 C), Min:97.7 F (36.5 C), Max:98.2 F (36.8 C)  Recent Labs  Lab 01/03/24 2346 01/03/24 2356 01/04/24 0353  WBC 6.6  --   --   CREATININE 1.10  --   --   LATICACIDVEN  --  2.7* 1.2    Estimated Creatinine Clearance: 53.6 mL/min (by C-G formula based on SCr of 1.1 mg/dL).    Allergies  Allergen Reactions   Pineapple Concentrate Nausea And Vomiting    Antimicrobials this admission: Cefepime  8/5 >>  Vancomycin  8/5 >>   Microbiology results: 8/5 BCx:   Thank you for allowing pharmacy to be a part of this patient's care.  Lynwood Poplar, PharmD, BCPS Clinical Pharmacist 01/04/2024 6:58 AM

## 2024-01-04 NOTE — Progress Notes (Signed)
 Heart Failure Navigator Progress Note  Assessed for Heart & Vascular TOC clinic readiness.  Patient does not meet criteria due to EF 55-60%, and per MD note patient with baseline Dementia. No HF TOC. .   Navigator will sign off at this time.   Stephane Haddock, BSN, Scientist, clinical (histocompatibility and immunogenetics) Only

## 2024-01-04 NOTE — Evaluation (Signed)
 Clinical/Bedside Swallow Evaluation Patient Details  Name: Matthew Matthew Edwards: [id number] Date of Birth: 1940-10-01  Today's Date: 01/04/2024 Time: SLP Start Time (ACUTE ONLY): 1612 SLP Stop Time (ACUTE ONLY): 1628 SLP Time Calculation (min) (ACUTE ONLY): 16 min  Past Medical History:  Past Medical History:  Diagnosis Date   Arthritis    Atrial fibrillation (HCC)    CAD (coronary artery disease) 2012   [place]-LAD 2 or 3 srents   Complication of anesthesia    loopy after polpy removal dec 2017, lasted several days   COPD (chronic obstructive pulmonary disease) (HCC)    Dementia (HCC)    GERD (gastroesophageal reflux disease)    HEART FAILURE, CONGESTIVE UNSPEC 02/23/2007   Qualifier: Diagnosis of  By: Matthew Matthew Edwards, [facility]     Hyperlipidemia    Hypertension    OSA (obstructive sleep apnea) 11/13/2014   S/P AVR    #25 mm Matthew Edwards pericardial valve Bioprosthetic. Dr Matthew Edwards.   Situational depression    son passed 11/2013   Sleep apnea    occ uses c pap does not know settings    Stented coronary artery 2013   TOBACCO DEPENDENCE 07/29/2006   Qualifier: History of  By: [place]  Edwards, Matthew     Type II diabetes mellitus [facility])    Past Surgical History:  Past Surgical History:  Procedure Laterality Date   AORTIC VALVE REPLACEMENT  07/2010   notes 07/28/2010   CARDIAC CATHETERIZATION  06/2010   thelbert 07/01/2010   COLONOSCOPY WITH PROPOFOL  N/A 02/08/2017   Procedure: COLONOSCOPY WITH PROPOFOL ;  Surgeon: Matthew Victory LITTIE DOUGLAS, Edwards;  Location: [facility];  Service: Gastroenterology;  Laterality: N/A;   CORONARY ANGIOPLASTY WITH STENT PLACEMENT  2006; 10/2006   2; 1/notes 10/01/2010   CORONARY ARTERY BYPASS GRAFT  07/2010   1/notes 07/28/2010   IR THORACENTESIS ASP PLEURAL SPACE W/IMG GUIDE  11/29/2023   IR THORACENTESIS ASP PLEURAL SPACE W/IMG GUIDE  01/04/2024   PARTIAL PROCTECTOMY BY TEM N/A 05/26/2016   Procedure: TEM PARTIAL PROCTECTOMY OF RECTAL MASS;  Surgeon: Elspeth Schultze, Edwards;  Location:  [facility];  Service: General;  Laterality: N/A;   HPI:  Patient is an 83 y.o. male with PMH: dementia, HFpEF, pulmonary hypertension, HLD, HTN, persistent a-fib, CAD s/p CABG in 2012, DM-2, COPD, GERD, chronic hypoxic respiratory failure on 2L home oxygen , OSA, aortic stenosis. He was recently admitted 6/28-12/04/23 with CHF exacerbation. He presented to the hospital on 01/04/2024 from home for evaluation of SOB, bilateral LE edema and AMS/confusion. He was recently discharged home from [facility]. CT head negative, CXR thoracentesis resulted in removal of 2 L clear, blood-laden pleural fluid. Patient made NPO and SLP swallow evaluation ordered.    Assessment / Plan / Recommendation  Clinical Impression  Patient presents with a mild, likely cognitive-based dysphagia as per this bedside swallow evaluation. He required setup assistance and cues to initiate self feeding with cup of juice and required feeding with minced solids (canned, diced pears). Mastication was mildly delayed but oral cavity clear of residuals s/p swallows. Swallow initiation appeared timely and no overt s/s aspiration observed with solids or thin liquids. Patient had a congested sounding cough prior to PO's with audible secretions however cough was not productive. SLP Visit Diagnosis: Dysphagia, unspecified (R13.10)    Aspiration Risk  Mild aspiration risk    Diet Recommendation Dysphagia 3 (Mech soft);Thin liquid    Liquid Administration via: Cup;Straw Medication Administration: Whole meds with liquid Supervision: Full supervision/cueing for compensatory  strategies;Staff to assist with self feeding Compensations: Slow rate;Small sips/bites    Other  Recommendations Oral Care Recommendations: Oral care BID;Staff/trained caregiver to provide oral care     Assistance Recommended at Discharge    Functional Status Assessment Patient has had a recent decline in their functional status and demonstrates the ability to make  significant improvements in function in a reasonable and predictable amount of time.  Frequency and Duration min 1 x/week  1 week       Prognosis Prognosis for improved oropharyngeal function: Fair Barriers to Reach Goals: Cognitive deficits      Swallow Study   General Date of Onset: 01/04/24 HPI: Patient is an 83 y.o. male with PMH: dementia, HFpEF, pulmonary hypertension, HLD, HTN, persistent a-fib, CAD s/p CABG in 2012, DM-2, COPD, GERD, chronic hypoxic respiratory failure on 2L home oxygen , OSA, aortic stenosis. He was recently admitted 6/28-12/04/23 with CHF exacerbation. He presented to the hospital on 01/04/2024 from home for evaluation of SOB, bilateral LE edema and AMS/confusion. He was recently discharged home from [facility]. CT head negative, CXR thoracentesis resulted in removal of 2 L clear, blood-laden pleural fluid. Patient made NPO and SLP swallow evaluation ordered. Type of Study: Bedside Swallow Evaluation Previous Swallow Assessment: 2021 BSE Diet Prior to this Study: NPO Temperature Spikes Noted: No Respiratory Status: Nasal cannula History of Recent Intubation: No Behavior/Cognition: Alert;Cooperative;Pleasant mood;Requires cueing Oral Cavity Assessment: Within Functional Limits Oral Care Completed by SLP: No Oral Cavity - Dentition: Adequate natural dentition;Missing dentition Vision: Functional for self-feeding Self-Feeding Abilities: Able to feed self;Needs set up;Needs assist Patient Positioning: Upright in bed Baseline Vocal Quality: Normal Volitional Cough: Cognitively unable to elicit Volitional Swallow: Unable to elicit    Oral/Motor/Sensory Function Overall Oral Motor/Sensory Function: Within functional limits   Ice Chips     Thin Liquid Thin Liquid: Within functional limits Presentation: Cup;Self Fed    Nectar Thick     Honey Thick     Puree Puree: Not tested   Solid     Solid: Impaired Presentation: Spoon Oral Phase Impairments: Impaired  mastication Oral Phase Functional Implications: Impaired mastication     [name], MA, CCC-SLP Speech Therapy

## 2024-01-04 NOTE — Progress Notes (Signed)
 Patient seen and examined personally, I reviewed the chart, history and physical and admission note, done by admitting physician this morning and agree with the same with following addendum.  Please refer to the morning admission note for more detailed plan of care.  Briefly,   83 year old male with chronic HFpEF, pulmonary hypertension, hyperlipidemia, hypertension, persistent A-fib, CAD status post CABG in 2012, diet controlled type 2 diabetes, COPD, chronic hypoxemic respiratory failure on 2 L home oxygen , dementia, OSA, aortic stenosis status post bioprosthetic AVR in 2012, cardiac fibroelastoma. Recently admitted 6/28-12/04/2023 for CHF exacerbation and released from SNF a wk ago present shortness of breath bilateral lower leg edema and altered mental status/confusion worse from baseline. Seen in the ED: Febrile up to 101.2, tachycardic tachypneic needing 4 L nasal cannula,Labs showing no leukocytosis, hemoglobin 9.9 (baseline 11-12), glucose 165, creatinine 1.1, albumin  2.8, troponin negative x 2, BNP 228, INR 2.6, blood cultures in process, ammonia level normal, lactic acid 2.7> 1.2, blood gas with pH 7.44 and PCO2 44, COVID/influenza/RSV PCR negative, UA not suggestive of infection. CXR>>vascular congestion and moderate to large right pleural effusion. Patient was given IV Lasix  40 mg, vancomycin , cefepime , metronidazole , and IV fluids and admitted for acute on chronic HFpEF recurrent moderate to large pleural effusion and sepsis with persistent A-fib with RVR.   Subjective: Seen and examined today Heart rate in 150s noted 50s to 60s earlier, BP stable, respiration 20s Heart rate now in 80s saturating well on 3 to nasal cannula s/p thoro No complaints, alert awake and orinted to self, not in distress and not combative  Assessment and plan:  Acute on chronic HFpEF RV failure Recurrent moderate to large pleural effusion Hypoxic respiratory failure due to CHF and pleural effusion: Last echo  done on 6/30.  S/p IV Lasix , cont supplemental oxygen  monitor intake output Daily weight. US  guided thoracentesis ordered AND S/P 2L fluid removal. F/U pleural fluid analysis. Aldactone  on hold. Reassess for lasix  need. Cardiology consulted.  Sepsis POA: No evidence of UTI or pneumonia negative COVID screening. Antibiotics for now follow-up Pro-Cal, RVP, culture data. Recent Labs  Lab 01/03/24 2346 01/03/24 2356 01/04/24 0353  WBC 6.6  --   --   LATICACIDVEN  --  2.7* 1.2    Acute encephalopathy Dementia: In setting of sepsis CHF complex comorbidities.Delirium precautions fall precautions.CT head ordered no acute finding   Persistent A-fib with RVR Bradycardia on last admission following which metoprolol  was discontinued: Patient Coumadin  but per family EMS was told no longer on Coumadin  but INR therapeutic.  Will hold on Coumadin  for now.  Continue IV Lopressor  prn.  Monitor in telemetry  Chronic normocytic anemia CAD CABG T2DM COPD  Mobility: PT Orders:  PT Follow up Rec:  pending.  DVT prophylaxis: SCDs Start: 01/04/24 9365 Code Status:   Code Status: Full Code Family Communication: plan of care discussed with patient at bedside. Patient status is: Remains hospitalized because of severity of illness Level of care: Progressive   Dispo: The patient is from: home            Anticipated disposition: TBD Objective: Vitals last 24 hrs: Vitals:   01/04/24 1100 01/04/24 1130 01/04/24 1145 01/04/24 1200  BP: 111/67 121/72 (!) 128/100 (!) 127/90  Pulse: 80 87 99 77  Resp: 18 12 20 18   Temp: 97.7 F (36.5 C)     TempSrc: Oral     SpO2: 100% 100% 100% 100%  Weight:      Height:  Physical Examination: General exam: alert awake able ot tell me his name HEENT:Oral mucosa moist, Ear/Nose WNL grossly Respiratory system: Bilaterally diminished,no use of accessory muscle Cardiovascular system: S1 & S2 +, No JVD. Gastrointestinal system: Abdomen soft,NT,ND,  BS+ Nervous System: Alert, awake Extremities: LE edema ++, distal extremities warm.  Skin: No rashes,no icterus. MSK: Normal muscle bulk,tone, power   Medications reviewed:  Scheduled Meds:  fluticasone  furoate-vilanterol  1 puff Inhalation Daily   gabapentin   100 mg Oral QHS   Continuous Infusions:  ceFEPime  (MAXIPIME ) IV     metronidazole  Stopped (01/04/24 1041)   [START ON 01/05/2024] vancomycin      Diet: Diet Order             Diet NPO time specified  Diet effective now

## 2024-01-04 NOTE — Consult Note (Addendum)
 Cardiology Consultation  Patient ID: EROL FLANAGIN MRN: [id number]; DOB: 1940/08/06  Admit date: 01/03/2024 Date of Consult: 01/04/2024  PCP:  [name], MD   [facility] Providers Cardiologist:  None     CC: Shortness of breath Reason of Consult: Chronic HFpEF Requesting Provider: Christobal Guadalajara, MD  Patient Profile: Matthew Edwards is a 83 y.o. male with a hx of CAD s/p stent to LAD and LCx, persistent atrial fibrillation, COPD on 2 L of oxygen  at home, GERD, hypertension, hyperlipidemia, type 2 diabetes, OSA, s/p AVR in 2012, dementia, chronic HFpEF, pulmonary hypertension who is being seen 01/04/2024 for the evaluation of chronic HFpEF at the request of Dr. Christobal.  History of Present Illness: Mr. Cecchi has past medical history as listed above. He presented to [facility] emergency department via EMS on 01/03/2024 complaining of shortness of breath, bilateral LE edema, AMS/confusion.  He was recently discharged from [facility] about 1 week prior.  EMS reported temperature of 101.2 F.  While in the ED patient was noted to be tachycardic and tachypneic, requiring 4 L of oxygen , baseline is 2 L.  Unable to get more history out of the patient due to him being confused on top of baseline dementia.  Patient is only oriented to person and place.  Relevant workup in the ED includes: Labs showed anemia with hemoglobin 9.9 (baseline is 11-12), BNP 228, lactic acid 2.7 > 1.2.  Respiratory swab negative, UA not suggestive of infection.  CXR showed vascular congestion, moderate to large right pleural effusion.  Troponin negative x 2.  Patient was admitted for acute on chronic HFpEF, recurrent moderate to large pleural effusion, sepsis, persistent A-fib with RVR.  He has been given IV Lasix  40 mg, vancomycin , cefepime , metronidazole  and IV fluids.  He underwent successful thoracentesis 8/5 AM that removed 2 L of fluid.  In regards to his persistent A-fib with RVR.  He was noted to be  bradycardic last admission when he was on metoprolol  so that was discontinued.  Patient reported being on Coumadin  however EMS reported that family stated he was no longer taking Coumadin .  INR therapeutic.  Holding Coumadin  for now as long as no contraindications.  Currently being treated with IV Lopressor  as needed.   Cardiology was consulted in the setting of chronic HFpEF.  After speaking with the patient, he is only oriented to person. He does not accurately know where he is, what year it is, or understand his current state. We are unable to obtain any more history as the patient is confused, the nursing staff does not have any updates, and no family at bedside.  Past Medical History:  Diagnosis Date   Arthritis    Atrial fibrillation (HCC)    CAD (coronary artery disease) 2012   [place]-LAD 2 or 3 srents   Complication of anesthesia    loopy after polpy removal dec 2017, lasted several days   COPD (chronic obstructive pulmonary disease) (HCC)    Dementia (HCC)    GERD (gastroesophageal reflux disease)    HEART FAILURE, CONGESTIVE UNSPEC 02/23/2007   Qualifier: Diagnosis of  By: VALMA MD, [facility]     Hyperlipidemia    Hypertension    OSA (obstructive sleep apnea) 11/13/2014   S/P AVR    #25 mm Edwards pericardial valve Bioprosthetic. Dr Lucas.   Situational depression    son passed 11/2013   Sleep apnea    occ uses c pap does not know settings  Stented coronary artery 2013   TOBACCO DEPENDENCE 07/29/2006   Qualifier: History of  By: [place]  MD, Stephanie     Type II diabetes mellitus [facility])    Past Surgical History:  Procedure Laterality Date   AORTIC VALVE REPLACEMENT  07/2010   notes 07/28/2010   CARDIAC CATHETERIZATION  06/2010   thelbert 07/01/2010   COLONOSCOPY WITH PROPOFOL  N/A 02/08/2017   Procedure: COLONOSCOPY WITH PROPOFOL ;  Surgeon: Legrand Victory LITTIE DOUGLAS, MD;  Location: [facility] ENDOSCOPY;  Service: Gastroenterology;  Laterality: N/A;   CORONARY ANGIOPLASTY WITH STENT  PLACEMENT  2006; 10/2006   2; 1/notes 10/01/2010   CORONARY ARTERY BYPASS GRAFT  07/2010   1/notes 07/28/2010   IR THORACENTESIS ASP PLEURAL SPACE W/IMG GUIDE  11/29/2023   IR THORACENTESIS ASP PLEURAL SPACE W/IMG GUIDE  01/04/2024   PARTIAL PROCTECTOMY BY TEM N/A 05/26/2016   Procedure: TEM PARTIAL PROCTECTOMY OF RECTAL MASS;  Surgeon: Elspeth Schultze, MD;  Location: [facility] ORS;  Service: General;  Laterality: N/A;    Home Medications:  Prior to Admission medications   Medication Sig Start Date End Date Taking? Authorizing Provider  acetaminophen  (TYLENOL ) 500 MG tablet Take 500 mg by mouth 2 (two) times daily.   Yes [provider]  ascorbic acid  (VITAMIN C ) 500 MG tablet Take 500 mg by mouth daily. 11/05/23  Yes [provider]  aspirin  325 MG tablet Take 325 mg by mouth daily. 12/25/23  Yes [provider]  donepezil  (ARICEPT ) 5 MG tablet Take 5 mg by mouth daily. 12/17/23  Yes [provider]  feeding supplement (ENSURE ENLIVE / ENSURE PLUS) LIQD Take 237 mLs by mouth 2 (two) times daily between meals. 10/18/23  Yes Cleotilde Perkins, DO  fluticasone -salmeterol (ADVAIR) 250-50 MCG/ACT AEPB Inhale 1 puff into the lungs 2 (two) times daily. 11/12/23  Yes [provider]  furosemide  (LASIX ) 20 MG tablet Take 1 tablet (20 mg total) by mouth daily. 11/30/23 01/03/25 Yes Arrien, Elidia Sieving, MD  gabapentin  (NEURONTIN ) 100 MG capsule TAKE 1 CAPSULE BY MOUTH AT  BEDTIME 09/15/21  Yes Chambliss, Layman LITTIE, MD  lisinopril  (ZESTRIL ) 5 MG tablet Take 1 tablet (5 mg total) by mouth daily. 12/01/23 01/03/25 Yes Arrien, Mauricio Daniel, MD  Multiple Vitamin (MULTIVITAMIN WITH MINERALS) TABS tablet Take 1 tablet by mouth daily.   Yes [provider]  QUEtiapine  (SEROQUEL ) 50 MG tablet Take 50 mg by mouth daily. 03/05/23  Yes [provider]  tiZANidine  (ZANAFLEX ) 2 MG tablet Take 2 mg by mouth at bedtime. 11/12/23  Yes [provider]  nitroGLYCERIN   (NITROSTAT ) 0.4 MG SL tablet Place 0.4 mg under the tongue every 5 (five) minutes as needed for chest pain. 06/15/23   [provider]  spironolactone  (ALDACTONE ) 25 MG tablet Take 0.5 tablets (12.5 mg total) by mouth daily. 12/01/23   Arrien, Mauricio Daniel, MD  warfarin (COUMADIN ) 5 MG tablet Take 5 mg by mouth daily. Patient not taking: Reported on 01/04/2024    [provider]   Scheduled Meds:  fluticasone  furoate-vilanterol  1 puff Inhalation Daily   [START ON 01/05/2024] furosemide   20 mg Oral Daily   gabapentin   100 mg Oral QHS   Continuous Infusions:  ceFEPime  (MAXIPIME ) IV 2 g (01/04/24 1653)   metronidazole  Stopped (01/04/24 1041)   [START ON 01/05/2024] vancomycin      PRN Meds: acetaminophen  **OR** acetaminophen , levalbuterol , metoprolol  tartrate  Allergies:    Allergies  Allergen Reactions   Pineapple Concentrate Nausea And Vomiting   Social  History:   Social History   Socioeconomic History   Marital status: Widowed    Spouse name: Not on file   Number of children: 7   Years of education: 11   Highest education level: Not on file  Occupational History   Occupation: Retired-Post Public affairs consultant: RETIRED   Occupation: [employer]  Tobacco Use   Smoking status: Former    Current packs/day: 0.50    Average packs/day: 0.5 packs/day for 46.0 years (23.0 ttl pk-yrs)    Types: Cigarettes   Smokeless tobacco: Never  Vaping Use   Vaping status: Never Used  Substance and Sexual Activity   Alcohol  use: No   Drug use: No   Sexual activity: Not on file  Other Topics Concern   Not on file  Social History Narrative   Health Care POA:    Emergency Contact: Daughter, [name]   End of Life Plan:    Who lives with you: self   Any pets: none   Diet: Patient has a varied diet of protein, starch, and vegetables.   Exercise: Pt has not regular exercise routine.   Seatbelts: Pt reports wearing seatbelt when in vehile.   [place] Exposure/Protection:    Hobbies:  fishing, tv      Daughter Regino Fournet [phone] - main caretaker currently    Malakhai Beitler    Social Drivers of Health   Financial Resource Strain: Low Risk  (12/09/2022)   Received from [facility]   Overall Financial Resource Strain (CARDIA)    Difficulty of Paying Living Expenses: Not hard at all  Food Insecurity: Patient Unable To Answer (11/28/2023)   Hunger Vital Sign    Worried About Running Out of Food in the Last Year: Patient unable to answer    Ran Out of Food in the Last Year: Patient unable to answer  Transportation Needs: Patient Unable To Answer (11/28/2023)   PRAPARE - Transportation    Lack of Transportation (Medical): Patient unable to answer    Lack of Transportation (Non-Medical): Patient unable to answer  Physical Activity: Not on file  Stress: No Stress Concern Present (03/22/2022)   Received from [facility] - Occupational Stress Questionnaire    Feeling of Stress : Not at all  Social Connections: Patient Unable To Answer (11/28/2023)   Social Connection and Isolation Panel    Frequency of Communication with Friends and Family: Patient unable to answer    Frequency of Social Gatherings with Friends and Family: Patient unable to answer    Attends Religious Services: Patient unable to answer    Active Member of Clubs or Organizations: Patient unable to answer    Attends Banker Meetings: Patient unable to answer    Marital Status: Patient unable to answer  Intimate Partner Violence: Patient Unable To Answer (11/28/2023)   Humiliation, Afraid, Rape, and Kick questionnaire    Fear of Current or Ex-Partner: Patient unable to answer    Emotionally Abused: Patient unable to answer    Physically Abused: Patient unable to answer    Sexually Abused: Patient unable to answer    Family History:   Family History  Problem Relation Age of Onset   Heart disease Mother    Colon cancer Neg Hx    Esophageal  cancer Neg Hx    Stomach cancer Neg Hx    Pancreatic cancer Neg Hx    Liver disease Neg Hx    Colon polyps  Neg Hx     ROS:  Review of Systems  Unable to perform ROS: Dementia  Per EMR: Shortness of breath, fevers, increased work of breathing, palpitations  Physical Exam/Data: Vitals:   01/04/24 1200 01/04/24 1226 01/04/24 1230 01/04/24 1410  BP: (!) 127/90  139/63 (!) 127/107  Pulse: 77  80 92  Resp: 18 17 17  (!) 22  Temp:    97.8 F (36.6 C)  TempSrc:    Oral  SpO2: 100%  100% 100%  Weight:      Height:        Intake/Output Summary (Last 24 hours) at 01/04/2024 1731 Last data filed at 01/04/2024 1410 Gross per 24 hour  Intake 1432.28 ml  Output 1500 ml  Net -67.72 ml      01/03/2024   11:29 PM 12/04/2023    4:20 AM 12/03/2023    4:21 AM  Last 3 Weights  Weight (lbs) 172 lb 6.4 oz 152 lb 8.9 oz 164 lb 0.4 oz  Weight (kg) 78.2 kg 69.2 kg 74.4 kg     Body mass index is 25.46 kg/m.   General:  Well nourished, well developed, in no acute distress HEENT: normal Neck: no JVD Vascular: Distal pulses 2+ bilaterally Cardiac:  normal S1, S2; RRR; no murmur  Lungs:  clear to auscultation bilaterally Abd: soft, nontender, no hepatomegaly  Ext: no edema Musculoskeletal:  No deformities Skin: warm and dry  Neuro:  oriented only to person, confused   Telemetry:  Telemetry was personally reviewed and demonstrates:  rate controlled atrial fibrillation, HR 80-90s  Relevant CV Studies:  Complete echocardiogram, 10/13/2023 Small 0. 5 x 0. 25 cm mobile echodensity in the left ventricular outflow tract. On serial comparison to study from 2021, this may have been present but was less well visualized on prior exam. 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 Mobile echodensity is distant from the aortic valve prosthetic leaflets and is attached to the basal septum. Best visualized clips 94- 96.  Left ventricular ejection fraction, by estimation, is 60 to 65% . The left ventricle  has normal function. The left ventricle has no regional wall motion abnormalities. There is mild left ventricular hypertrophy. Left ventricular diastolic parameters are indeterminate.  Right ventricular systolic function is moderately reduced. The right ventricular size is moderately enlarged. There is moderately elevated pulmonary artery systolic pressure. The estimated right ventricular systolic pressure is 46. 7 mmHg.  Left atrial size was severely dilated.  Right atrial size was severely dilated.  The mitral valve is degenerative. Mild to moderate mitral valve regurgitation. Mild to moderate mitral stenosis. The mean mitral valve gradient is 7. 0 mmHg with average heart rate of 106 bpm. Severe mitral annular calcification.  Tricuspid valve regurgitation is mild to moderate.  The aortic valve is normal in structure. Aortic valve regurgitation is not visualized. No aortic stenosis is present. There is a 25 mm Edwards pericardial valve present in the aortic position. Procedure Date: 2012. Echo findings are consistent with normal structure and function of the aortic valve prosthesis. Aortic valve area, by VTI measures 3. 60 cm . Aortic valve mean gradient measures 3. 5 mmHg. Aortic valve Vmax measures 1. 26 m/ s. The inferior vena cava is dilated in size with > 50% respiratory variability, suggesting right atrial pressure of 8 mmHg.  Limited echocardiogram, 11/29/2023 Left ventricular ejection fraction, by estimation, is 55 to 60% . The left ventricle has normal function. The left ventricle has no regional wall motion abnormalities. Left ventricular diastolic  parameters are indeterminate.  Right ventricular systolic function is mildly reduced. The right ventricular size is mildly enlarged.  Left atrial size was severely dilated.  Right atrial size was severely dilated.  The mitral valve is degenerative. Mild mitral valve regurgitation. The mean mitral valve gradient is 5. 0 mmHg. Severe mitral annular  calcification.  The aortic valve has been repaired/ replaced. There is a 25 mm Edwards pericardial valve present in the aortic position. Procedure Date: 2012. Aortic valve mean gradient measures 4. 0 mmHg. Aortic valve Vmax measures 1. 35 m/ s.  There is a small echodensity in the LVOT seen on prior studies. 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  The inferior vena cava is dilated in size with < 50% respiratory variability, suggesting right atrial pressure of 15 mmHg.  Laboratory Data: High Sensitivity Troponin:   Recent Labs  Lab 01/03/24 2346 01/04/24 0120  TROPONINIHS 8 10     Chemistry Recent Labs  Lab 01/03/24 2346 01/03/24 2358 01/04/24 1443  NA 140 140 141  K 3.8 3.7 4.2  CL 101  --  99  CO2 29  --  33*  GLUCOSE 165*  --  97  BUN 15  --  13  CREATININE 1.10  --  0.91  CALCIUM  9.1  --  9.0  GFRNONAA >60  --  >60  ANIONGAP 10  --  9    Recent Labs  Lab 01/03/24 2346  PROT 6.2*  ALBUMIN  2.8*  AST 25  ALT 18  ALKPHOS 78  BILITOT 0.7   Lipids No results for input(s): CHOL, TRIG, HDL, LABVLDL, LDLCALC, CHOLHDL in the last 168 hours.  Hematology Recent Labs  Lab 01/03/24 2346 01/03/24 2358 01/04/24 1443  WBC 6.6  --  8.6  RBC 3.73*  --  4.16*  HGB 9.9* 11.2* 11.0*  HCT 33.4* 33.0* 37.4*  MCV 89.5  --  89.9  MCH 26.5  --  26.4  MCHC 29.6*  --  29.4*  RDW 16.5*  --  16.3*  PLT 160  --  163   Thyroid  No results for input(s): TSH, FREET4 in the last 168 hours.  BNP Recent Labs  Lab 01/03/24 2346  BNP 228.2*    DDimer No results for input(s): DDIMER in the last 168 hours.  Radiology/Studies:  IR THORACENTESIS ASP PLEURAL SPACE W/IMG GUIDE Result Date: 01/04/2024 INDICATION: 83 year old male with history of heart failure, with recurrent right-sided pleural effusion. IR was requested for diagnostic and therapeutic thoracentesis. EXAM: ULTRASOUND GUIDED DIAGNOSTIC AND THERAPEUTIC THORACENTESIS MEDICATIONS: 6 cc of 1% lidocaine  with epi.  COMPLICATIONS: None immediate. PROCEDURE: An ultrasound guided thoracentesis was thoroughly discussed with the patient and questions answered. The benefits, risks, alternatives and complications were also discussed. The patient understands and wishes to proceed with the procedure. Written consent was obtained. Ultrasound was performed to localize and mark an adequate pocket of fluid in the right chest. The area was then prepped and draped in the normal sterile fashion. 1% Lidocaine  was used for local anesthesia. Under ultrasound guidance a 6 Fr Safe-T-Centesis catheter was introduced. Thoracentesis was performed. The catheter was removed and a dressing applied. FINDINGS: A total of approximately 2.0 L of clear, blood-laden pleural fluid was removed. Samples were sent to the laboratory as requested by the clinical team. IMPRESSION: Successful ultrasound guided right thoracentesis yielding 2.0 of pleural fluid. Procedure performed by Carlin Griffon, PA-C Electronically Signed   By: Ester Sides M.D.   On: 01/04/2024 11:36   DG Chest 1 View Result  Date: 01/04/2024 CLINICAL DATA:  Post thoracentesis. EXAM: CHEST  1 VIEW COMPARISON:  Radiographs 01/03/2024 and 11/29/2023. FINDINGS: 0926 hours. Interval decreased right pleural effusion following thoracentesis. There is a small residual right pleural effusion with mild residual right basilar atelectasis. No pneumothorax. There is stable vascular congestion with mild atelectasis at the left lung base. The heart size and mediastinal contours are stable post median sternotomy and probable aortic valve replacement. No acute osseous findings are seen. IMPRESSION: Interval decreased right pleural effusion following thoracentesis. No pneumothorax. Electronically Signed   By: Elsie Perone M.D.   On: 01/04/2024 10:11   CT HEAD WO CONTRAST ( ) Result Date: 01/04/2024 EXAM: CT HEAD WITHOUT 01/04/2024 06:37:13 AM TECHNIQUE: CT of the head was performed without the  administration of intravenous contrast. Automated exposure control, iterative reconstruction, and/or weight based adjustment of the mA/kV was utilized to reduce the radiation dose to as low as reasonably achievable. COMPARISON: CT head without contrast 10/12/2023 and 09/25/2023. CLINICAL HISTORY: Delirium. FINDINGS: BRAIN AND VENTRICLES: No acute intracranial hemorrhage. No mass effect or midline shift. No extra-axial fluid collection. Gray-white differentiation is maintained. No hydrocephalus. Periventricular and subcortical white matter hypoattenuation is mildly advanced for age. Mild diffuse atrophy is stable. ORBITS: No acute abnormality. SINUSES AND MASTOIDS: No acute abnormality. SOFT TISSUES AND SKULL: No acute skull fracture. No acute soft tissue abnormality. VASCULATURE: Atherosclerotic calcifications are present in the cavernous carotid arteries bilaterally and at the dural margin of both vertebral arteries. No hyperdense vessel is present. IMPRESSION: 1. No acute intracranial abnormality. 2. Mild diffuse atrophy, stable. 3. Mildly advanced periventricular and subcortical white matter hypoattenuation for age, stable. Electronically signed by: Lonni Necessary MD 01/04/2024 06:44 AM EDT RP Workstation: [id number]   DG Chest Port 1 View Result Date: 01/03/2024 CLINICAL DATA:  Shortness of breath. EXAM: PORTABLE CHEST 1 VIEW COMPARISON:  11/29/2023 FINDINGS: Moderate to large right pleural effusion, passing the lower 2/3 of right hemithorax. Prior median sternotomy with prosthetic cardiac valve. Heart size is obscured by right pleural effusion. Vascular congestion. No pneumothorax. IMPRESSION: 1. Moderate to large right pleural effusion. 2. Vascular congestion. Electronically Signed   By: Andrea Gasman M.D.   On: 01/03/2024 23:44   Assessment and Plan:  Chronic HFpEF States he, NYHA class II  BNP 228, has had more elevated readings in the past Renal function stable Home meds: PO Lasix  20 mg  daily, lisinopril  5 mg daily, spironolactone  12.5 mg daily Given IV Lasix  40 mg x 1 dose early this morning No recorded urine output at this time 2 L removed via thoracentesis Patient appears euvolemic on exam Restart home PO Lasix  20 mg daily for maintenance  Restart home GDMT as labs and blood pressure allows.  Persistent atrial fibrillation, with episodes of RVR Previously on metoprolol , stopped due to bradycardia Reported to be on Coumadin , EMS said the family said patient was not taking INR therapeutic Currently in atrial fibrillation, with HR 80-90s Spoke with pharmacist, who believes that patient has been filling his Coumadin , there are fill records, potentially could have ran out in July  Unable to obtain history from patient  Continue Coumadin -dose by pharmacy as long as no contraindications.  S/p AVR  Mild mitral regurgitation Underwent AVR in 2012 with an Edwards pericardial valve  Echo from June 2025 showed: EF 55 to 60%, severe biatrial enlargement, mild MR, ? papillary fibroelastoma, dilated IVC No indication for valvular intervention at this time Continue to monitor on  serial echos, or if symptoms develop  CAD s/p CABG in 2012 Not currently on ASA due to Coumadin  use Previously on metoprolol , held due to bradycardia Not currently on statin therapy with plans to reduce pill burden given advanced age and dementia Not currently having any chest pain Troponin negative x 2  Goals of care Patient is demented at baseline but appears more altered than his baseline per chart review He has been in the hospital multiple times just this year He is unable to tell us  any history, therefore we are not sure if he has been complaint with his  medications Currently still full code Would benefit from palliative medicine consult to determine goals of care and define code status Defer to primary   Per primary Recurrent moderate to large pleural effusions Sepsis Acute  encephalopathy Dementia COPD Diabetes Anemia   Risk Assessment/Risk Scores:      [place]  Heart Association (NYHA) Functional Class Unable to assess symptoms per patient   CHA2DS2-VASc Score = 6   This indicates a 9.7% annual risk of stroke. The patient's score is based upon: CHF History: 1 HTN History: 1 Diabetes History: 1 Stroke History: 0 Vascular Disease History: 1 Age Score: 2 Gender Score: 0   For questions or updates, please contact [facility] Please consult [email or web address] for contact info under    Signed, Waddell DELENA Donath, PA-C  01/04/2024 2:36 PM  ADDENDUM:   Patient seen and examined with Waddell DELENA Donath, PA-C.  I personally taken a history, examined the patient, reviewed relevant notes,  laboratory data / imaging studies.  I performed a substantive portion of this encounter and formulated the important aspects of the plan.  I agree with the APP's note, impression, and recommendations; however, I have edited the note to reflect changes or salient points.   Patient is a poor historian and therefore unable to accurately assess the HPI.  After reviewing the electronic medical records, speaking to RN, it appears that he was experiencing shortness of breath and EMS was called.  He was noted to be febrile when EMS arrived and tachycardic and tachypnea in the ED.  He is being worked up for possible sepsis and cardiology consulted for heart failure management.  Patient has known history of chronic HFpEF on maximally tolerated doses of GDMT as per the last discharge summary.  He also has persistent atrial fibrillation not on AV nodal blocking agents due to recent bradycardia & on Coumadin  for thromboembolic prophylaxis.  Though patient has underlying dementia and is not the best historian he denies anginal chest pain, orthopnea, PND, or lower extremity swelling.  PHYSICAL EXAM: Today's Vitals   01/04/24 1200 01/04/24 1226 01/04/24 1230 01/04/24 1410  BP:  (!) 127/90  139/63 (!) 127/107  Pulse: 77  80 92  Resp: 18 17 17  (!) 22  Temp:    97.8 F (36.6 C)  TempSrc:    Oral  SpO2: 100%  100% 100%  Weight:      Height:      PainSc:    0-No pain   Body mass index is 25.46 kg/m.   Net IO Since Admission: -67.72 mL [01/04/24 1731]  Filed Weights   01/03/24 2329  Weight: 78.2 kg   General: Appears older than stated age, hemodynamically stable, no acute distress HEENT: Normocephalic, atraumatic, trachea midline, no JVP Lungs: Decreased breath sounds bilaterally anterior and laterally Heart: Irregularly irregular, variable S1-S2, soft holosystolic murmur at the apex, no gallops or rubs Abdomen: Soft, nontender, nondistended, positive bowel sounds in all  4 quadrants Extremities: Warm to touch, no swelling Neuro: Alert himself, know dementia  EKG: (personally reviewed by me) 01/04/2024 -atrial fibrillation with controlled ventricular rate, rare PVCs, nonspecific ST-T changes  Telemetry: (personally reviewed by me) Atrial fibrillation with controlled ventricular rate   Impression & Recommendations: :  Chronic HFpEF. Moderate to large pleural effusion status post thoracentesis /acute febrile illness ?  SIRS/sepsis Permanent A-fib. Coronary artery disease with prior surgical revascularization without angina pectoris. Aortic valve disease history of AVR Hypertension. Hyperlipidemia. Diabetes mellitus type 2. Dementia  Cardiology consulted during his hospitalization for concerns for acute decompensated heart failure. On clinical examination he is overall euvolemic. BMP slightly elevated, still less compared to his prior levels. Would recommend up titration/restarting home GDMT.  Given his underlying permanent atrial fibrillation and CHA2DS2-VASc score would recommend anticoagulation if no contraindication.  Did speak to inpatient pharmacy it appears that the Coumadin  tablets are being filled.  And his INR on arrival is  therapeutic.  Suspect that this hospitalization is likely triggered by sepsis/infection as he was febrile when EMS arrived, noted to be tachycardic into picnic in the ED.  Currently on broad-spectrum antibiotics.  Managed by primary team.  Underwent thoracentesis status post 2 L microbiology pending.  From cardiovascular standpoint no plans for invasive testing at this time.  Overall comorbid conditions are well compensated.  Will sign off for now please reach out if any questions or concerns arise.  Plan of care discussed with nursing staff as well as attending physician.  May benefit from palliative care consult to discuss goals of care.  This note was created using a voice recognition software as a result there may be grammatical errors inadvertently enclosed that do not reflect the nature of this encounter. Every attempt is made to correct such errors.   Madonna Michele HAS, [facility]  A Division of [facility] [address] [place], [place] [address]  [place], [place] [address] Pager: [phone] Office: [phone] 01/04/2024 5:31 PM

## 2024-01-04 NOTE — H&P (Signed)
 History and Physical    Matthew Edwards FMW:[id number] DOB: 1941/04/08 DOA: 01/03/2024  PCP: [name], MD  Patient coming from: Home  Chief Complaint: Shortness of breath  HPI: Matthew Edwards is a 83 y.o. male with medical history significant of chronic HFpEF, pulmonary hypertension, hyperlipidemia, hypertension, persistent A-fib, CAD status post CABG in 2012, diet controlled type 2 diabetes, COPD, chronic hypoxemic respiratory failure on 2 L home oxygen , dementia, OSA, aortic stenosis status post bioprosthetic AVR in 2012, cardiac fibroelastoma.  Recently admitted 6/28-12/04/2023 for CHF exacerbation.  Metoprolol  was discontinued during this hospitalization due to bradycardia and he was discharged on Lasix  20 mg daily.  Patient presents to the ED today via EMS from home for evaluation of shortness of breath, bilateral lower extremity edema, and altered mental status/confusion.  Patient was recently discharged from [facility] about a week ago.  Temperature 101.2 F with EMS.  Family reported to EMS that patient has no longer on Coumadin .  In the ED, patient noted to be tachycardic and tachypneic.  Satting well on 4 L [place].  Not hypotensive.  Labs showing no leukocytosis, hemoglobin 9.9 (baseline 11-12), glucose 165, creatinine 1.1, albumin  2.8, troponin negative x 2, BNP 228, INR 2.6, blood cultures in process, ammonia level normal, lactic acid 2.7> 1.2, blood gas with pH 7.44 and PCO2 44, COVID/influenza/RSV PCR negative, UA not suggestive of infection.  Chest x-ray showing vascular congestion and moderate to large right pleural effusion.  Patient was given IV Lasix  40 mg, vancomycin , cefepime , metronidazole , and IV fluids.  Patient has dementia and not able to give much history.  He is oriented to person and place only.  Reporting some shortness of breath but denies chest pain  Review of Systems:  Review of Systems  All other systems reviewed and are negative.   Past Medical History:   Diagnosis Date   Arthritis    Atrial fibrillation (HCC)    CAD (coronary artery disease) 2012   [place]-LAD 2 or 3 srents   Complication of anesthesia    loopy after polpy removal dec 2017, lasted several days   COPD (chronic obstructive pulmonary disease) (HCC)    Dementia (HCC)    GERD (gastroesophageal reflux disease)    HEART FAILURE, CONGESTIVE UNSPEC 02/23/2007   Qualifier: Diagnosis of  By: VALMA MD, [facility]     Hyperlipidemia    Hypertension    OSA (obstructive sleep apnea) 11/13/2014   S/P AVR    #25 mm Edwards pericardial valve Bioprosthetic. Dr Lucas.   Situational depression    son passed 11/2013   Sleep apnea    occ uses c pap does not know settings    Stented coronary artery 2013   TOBACCO DEPENDENCE 07/29/2006   Qualifier: History of  By: [place]  MD, Stephanie     Type II diabetes mellitus [facility])     Past Surgical History:  Procedure Laterality Date   AORTIC VALVE REPLACEMENT  07/2010   notes 07/28/2010   CARDIAC CATHETERIZATION  06/2010   thelbert 07/01/2010   COLONOSCOPY WITH PROPOFOL  N/A 02/08/2017   Procedure: COLONOSCOPY WITH PROPOFOL ;  Surgeon: Legrand Victory LITTIE DOUGLAS, MD;  Location: [facility] ENDOSCOPY;  Service: Gastroenterology;  Laterality: N/A;   CORONARY ANGIOPLASTY WITH STENT PLACEMENT  2006; 10/2006   2; 1/notes 10/01/2010   CORONARY ARTERY BYPASS GRAFT  07/2010   1/notes 07/28/2010   IR THORACENTESIS ASP PLEURAL SPACE W/IMG GUIDE  11/29/2023   PARTIAL PROCTECTOMY BY TEM N/A 05/26/2016  Procedure: TEM PARTIAL PROCTECTOMY OF RECTAL MASS;  Surgeon: Elspeth Schultze, MD;  Location: [facility] ORS;  Service: General;  Laterality: N/A;     reports that he has quit smoking. His smoking use included cigarettes. He has a 23 pack-year smoking history. He has never used smokeless tobacco. He reports that he does not drink alcohol  and does not use drugs.  Allergies  Allergen Reactions   Pineapple Concentrate Nausea And Vomiting    Family History  Problem Relation Age of Onset    Heart disease Mother    Colon cancer Neg Hx    Esophageal cancer Neg Hx    Stomach cancer Neg Hx    Pancreatic cancer Neg Hx    Liver disease Neg Hx    Colon polyps Neg Hx     Prior to Admission medications   Medication Sig Start Date End Date Taking? Authorizing Provider  acetaminophen  (TYLENOL ) 500 MG tablet Take 500 mg by mouth 2 (two) times daily.    [provider]  ascorbic acid  (VITAMIN C ) 500 MG tablet Take 500 mg by mouth daily. 11/05/23   [provider]  feeding supplement (ENSURE ENLIVE / ENSURE PLUS) LIQD Take 237 mLs by mouth 2 (two) times daily between meals. 10/18/23   Cleotilde Perkins, DO  fluticasone -salmeterol (ADVAIR) 250-50 MCG/ACT AEPB Inhale 1 puff into the lungs 2 (two) times daily. 11/12/23   [provider]  furosemide  (LASIX ) 20 MG tablet Take 1 tablet (20 mg total) by mouth daily. 11/30/23 12/30/23  Arrien, Mauricio Daniel, MD  gabapentin  (NEURONTIN ) 100 MG capsule TAKE 1 CAPSULE BY MOUTH AT  BEDTIME 09/15/21   Jeanelle Layman CROME, MD  lisinopril  (ZESTRIL ) 5 MG tablet Take 1 tablet (5 mg total) by mouth daily. 12/01/23 12/31/23  Arrien, Mauricio Daniel, MD  Multiple Vitamin (MULTIVITAMIN WITH MINERALS) TABS tablet Take 1 tablet by mouth daily.    [provider]  nitroGLYCERIN  (NITROSTAT ) 0.4 MG SL tablet Place 0.4 mg under the tongue every 5 (five) minutes as needed for chest pain. 06/15/23   [provider]  QUEtiapine  (SEROQUEL ) 50 MG tablet Take 50 mg by mouth daily. 03/05/23   [provider]  spironolactone  (ALDACTONE ) 25 MG tablet Take 0.5 tablets (12.5 mg total) by mouth daily. 12/01/23   Arrien, Elidia Sieving, MD  tiZANidine  (ZANAFLEX ) 2 MG tablet Take 2 mg by mouth at bedtime. 11/12/23   [provider]  warfarin (COUMADIN ) 5 MG tablet Take 5 mg by mouth daily.    [provider]    Physical Exam: Vitals:   01/04/24 0200 01/04/24 0230 01/04/24 0400 01/04/24 0434  BP: 136/84 119/76 (!) 113/93    Pulse: (!) 109 65 (!) 50 80  Resp: (!) 24 (!) 23 (!) 30 (!) 24  Temp:    97.7 F (36.5 C)  TempSrc:    Oral  SpO2: 99% 94% 97% 97%  Weight:      Height:        Physical Exam Vitals reviewed.  Constitutional:      General: He is not in acute distress. HENT:     Head: Normocephalic and atraumatic.  Eyes:     Extraocular Movements: Extraocular movements intact.  Cardiovascular:     Rate and Rhythm: Tachycardia present. Rhythm irregular.     Pulses: Normal pulses.  Pulmonary:     Effort: No respiratory distress.     Breath sounds: Rhonchi present. No wheezing.     Comments: Mildly tachypneic Abdominal:  General: Bowel sounds are normal. There is no distension.     Palpations: Abdomen is soft.     Tenderness: There is no abdominal tenderness. There is no guarding.  Musculoskeletal:     Cervical back: Normal range of motion.     Right lower leg: Edema present.     Left lower leg: Edema present.     Comments: 2+ pitting edema of bilateral lower extremities  Skin:    General: Skin is warm and dry.  Neurological:     General: No focal deficit present.     Mental Status: He is alert.     Cranial Nerves: No cranial nerve deficit.     Sensory: No sensory deficit.     Motor: No weakness.    Labs on Admission: I have personally reviewed following labs and imaging studies  CBC: Recent Labs  Lab 01/03/24 2346 01/03/24 2358  WBC 6.6  --   NEUTROABS 5.5  --   HGB 9.9* 11.2*  HCT 33.4* 33.0*  MCV 89.5  --   PLT 160  --    Basic Metabolic Panel: Recent Labs  Lab 01/03/24 2346 01/03/24 2358  NA 140 140  K 3.8 3.7  CL 101  --   CO2 29  --   GLUCOSE 165*  --   BUN 15  --   CREATININE 1.10  --   CALCIUM  9.1  --    GFR: Estimated Creatinine Clearance: 53.6 mL/min (by C-G formula based on SCr of 1.1 mg/dL). Liver Function Tests: Recent Labs  Lab 01/03/24 2346  AST 25  ALT 18  ALKPHOS 78  BILITOT 0.7  PROT 6.2*  ALBUMIN  2.8*   No results for input(s):  LIPASE, AMYLASE in the last 168 hours. Recent Labs  Lab 01/03/24 2346  AMMONIA 32   Coagulation Profile: Recent Labs  Lab 01/03/24 2346  INR 2.6*   Cardiac Enzymes: No results for input(s): CKTOTAL, CKMB, CKMBINDEX, TROPONINI in the last 168 hours. BNP (last 3 results) No results for input(s): PROBNP in the last 8760 hours. HbA1C: No results for input(s): HGBA1C in the last 72 hours. CBG: No results for input(s): GLUCAP in the last 168 hours. Lipid Profile: No results for input(s): CHOL, HDL, LDLCALC, TRIG, CHOLHDL, LDLDIRECT in the last 72 hours. Thyroid  Function Tests: No results for input(s): TSH, T4TOTAL, FREET4, T3FREE, THYROIDAB in the last 72 hours. Anemia Panel: No results for input(s): VITAMINB12, FOLATE, FERRITIN, TIBC, IRON , RETICCTPCT in the last 72 hours. Urine analysis:    Component Value Date/Time   COLORURINE YELLOW 01/04/2024 0412   APPEARANCEUR CLEAR 01/04/2024 0412   LABSPEC 1.013 01/04/2024 0412   PHURINE 5.0 01/04/2024 0412   GLUCOSEU NEGATIVE 01/04/2024 0412   HGBUR NEGATIVE 01/04/2024 0412   BILIRUBINUR NEGATIVE 01/04/2024 0412   BILIRUBINUR negative 08/14/2020 1441   BILIRUBINUR NEG 02/01/2015 0950   KETONESUR NEGATIVE 01/04/2024 0412   PROTEINUR NEGATIVE 01/04/2024 0412   UROBILINOGEN 0.2 08/14/2020 1441   UROBILINOGEN 1.0 07/24/2010 1411   NITRITE NEGATIVE 01/04/2024 0412   LEUKOCYTESUR NEGATIVE 01/04/2024 0412    Radiological Exams on Admission: DG Chest Port 1 View Result Date: 01/03/2024 CLINICAL DATA:  Shortness of breath. EXAM: PORTABLE CHEST 1 VIEW COMPARISON:  11/29/2023 FINDINGS: Moderate to large right pleural effusion, passing the lower 2/3 of right hemithorax. Prior median sternotomy with prosthetic cardiac valve. Heart size is obscured by right pleural effusion. Vascular congestion. No pneumothorax. IMPRESSION: 1. Moderate to large right pleural effusion. 2. Vascular congestion.  Electronically  Signed   By: Andrea Gasman M.D.   On: 01/03/2024 23:44    EKG: Independently reviewed.  A-fib.  Assessment and Plan  Acute on chronic HFpEF, RV failure Patient is presenting with dyspnea and bilateral lower extremity edema.  BNP 228.  Chest x-ray showing vascular congestion and moderate to large right pleural effusion. 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  Patient was given IV Lasix  40 mg in the ED.  Continue IV diuresis as blood pressure is able to tolerate.  Monitor intake and output, daily weights.  Recurrent moderate to large right pleural effusion Acute on chronic hypoxemic respiratory failure Underwent thoracentesis back in June as well.  Question whether it is related to heart failure since it is unilateral. At baseline he is on 2 L supplemental oxygen  and currently requiring 4 L [place].   IR consulted for thoracentesis and pleural fluid analysis labs ordered.  Hold Coumadin  at this time.  Keep n.p.o.  Sepsis Meets criteria for sepsis with fever, tachycardia, tachypnea, and lactic acidosis.  No leukocytosis on labs.  No pneumonia on chest x-ray and does not appear to have a UTI.  COVID/influenza/RSV PCR negative.  No meningeal signs.  Lactic acidosis has now resolved and not hypotensive.  Continue broad-spectrum antibiotics and follow-up blood cultures.  Will also add on urine culture.  Check procalcitonin level and full respiratory viral panel ordered.  Acute encephalopathy Patient is currently oriented to person and place but otherwise confused and not able to give much history.  Unclear what his baseline is given dementia.  Ammonia level normal.  UA not suggestive of infection.  No meningeal signs.  Stat CT head ordered.  Delirium precautions.  Persistent A-fib with RVR Oral metoprolol  was  discontinued during his hospitalization a month ago due to bradycardia.  Heart rate currently 110-130 in the setting of sepsis. Reportedly family told EMS that patient is no longer on Coumadin ?  INR 2.6.  Holding Coumadin  at this time until CT head is done and need for thoracentesis.  IV metoprolol  PRN HR >120.  Chronic normocytic anemia Hemoglobin 9.9 (baseline 11-12).  No overt bleeding.  Monitor H&H.  CAD status post CABG in 2012 Workup not suggestive of ACS.  Diet controlled type 2 diabetes A1c 5.6 on 11/28/2023.  COPD No wheezing.  Xopenex  neb PRN.  Pharmacy med rec pending.  DVT prophylaxis: SCDs Code Status: Full code by default.  Patient does not have capacity for decision-making, no surrogate or prior directive available. Family Communication: No family available at this time. Level of care: Progressive Care Unit Admission status: It is my clinical opinion that admission to INPATIENT is reasonable and necessary because of the expectation that this patient will require hospital care that crosses at least 2 midnights to treat this condition based on the medical complexity of the problems presented.  Given the aforementioned information, the predictability of an adverse outcome is felt to be significant.  Editha Ram MD [facility]  If 7PM-7AM, please contact night-coverage [email or web address]  01/04/2024, 5:36 AM

## 2024-01-05 DIAGNOSIS — F0390 Unspecified dementia without behavioral disturbance: Secondary | ICD-10-CM

## 2024-01-05 DIAGNOSIS — G9340 Encephalopathy, unspecified: Secondary | ICD-10-CM | POA: Diagnosis not present

## 2024-01-05 DIAGNOSIS — I5033 Acute on chronic diastolic (congestive) heart failure: Secondary | ICD-10-CM | POA: Diagnosis not present

## 2024-01-05 DIAGNOSIS — Z515 Encounter for palliative care: Secondary | ICD-10-CM

## 2024-01-05 LAB — BASIC METABOLIC PANEL WITH GFR
Anion gap: 7 (ref 5–15)
BUN: 13 mg/dL (ref 8–23)
CO2: 33 mmol/L — ABNORMAL HIGH (ref 22–32)
Calcium: 9.1 mg/dL (ref 8.9–10.3)
Chloride: 100 mmol/L (ref 98–111)
Creatinine, Ser: 0.97 mg/dL (ref 0.61–1.24)
GFR, Estimated: >60 mL/min (ref >60)
Glucose, Bld: 122 mg/dL — ABNORMAL HIGH (ref 70–99)
Potassium: 4.2 mmol/L (ref 3.5–5.1)
Sodium: 140 mmol/L (ref 135–145)

## 2024-01-05 LAB — CBC
HCT: 36.1 % — ABNORMAL LOW (ref 39.0–52.0)
Hemoglobin: 10.6 g/dL — ABNORMAL LOW (ref 13.0–17.0)
MCH: 26.2 pg (ref 26.0–34.0)
MCHC: 29.4 g/dL — ABNORMAL LOW (ref 30.0–36.0)
MCV: 89.4 fL (ref 80.0–100.0)
Platelets: 152 K/uL (ref 150–400)
RBC: 4.04 MIL/uL — ABNORMAL LOW (ref 4.22–5.81)
RDW: 16.3 % — ABNORMAL HIGH (ref 11.5–15.5)
WBC: 7.5 K/uL (ref 4.0–10.5)
nRBC: 0 % (ref 0.0–0.2)

## 2024-01-05 LAB — PROTIME-INR
INR: 2.1 — ABNORMAL HIGH (ref 0.8–1.2)
Prothrombin Time: 24.5 s — ABNORMAL HIGH (ref 11.4–15.2)

## 2024-01-05 LAB — GLUCOSE, CAPILLARY
Glucose-Capillary: 124 mg/dL — ABNORMAL HIGH (ref 70–99)
Glucose-Capillary: 122 mg/dL — ABNORMAL HIGH (ref 70–99)

## 2024-01-05 LAB — CYTOLOGY - NON PAP

## 2024-01-05 MED ORDER — TIZANIDINE HCL 4 MG PO TABS
2.0000 mg | ORAL_TABLET | Freq: Every day | ORAL | Status: DC
  Administered 2024-01-05 – 2024-01-06 (×2): 2 mg via ORAL
  Filled 2024-01-05 (×2): qty 1

## 2024-01-05 MED ORDER — INSULIN ASPART 100 UNIT/ML IJ SOLN: 0.0000 [IU] | Freq: Three times a day (TID) | INTRAMUSCULAR | Status: DC

## 2024-01-05 MED ORDER — QUETIAPINE FUMARATE 50 MG PO TABS
50.0000 mg | ORAL_TABLET | Freq: Every day | ORAL | Status: DC
Start: 2024-01-06 — End: 2024-01-07
  Administered 2024-01-05 – 2024-01-06 (×2): 50 mg via ORAL
  Filled 2024-01-05 (×2): qty 1

## 2024-01-05 MED ORDER — DONEPEZIL HCL 5 MG PO TABS
5.0000 mg | ORAL_TABLET | Freq: Every day | ORAL | Status: DC
  Administered 2024-01-05 – 2024-01-06 (×2): 5 mg via ORAL
  Filled 2024-01-05 (×3): qty 1

## 2024-01-05 MED ORDER — WARFARIN - PHARMACIST DOSING INPATIENT: Freq: Every day | Status: DC

## 2024-01-05 MED ORDER — SPIRONOLACTONE 12.5 MG HALF TABLET
12.5000 mg | ORAL_TABLET | Freq: Every day | ORAL | Status: DC
  Administered 2024-01-05 – 2024-01-06 (×2): 12.5 mg via ORAL
  Filled 2024-01-05 (×3): qty 1

## 2024-01-05 MED ORDER — WARFARIN SODIUM 5 MG PO TABS: 5.0000 mg | ORAL_TABLET | Freq: Once | ORAL | Status: AC

## 2024-01-05 NOTE — Progress Notes (Signed)
 Returned call to pt dtr [name] Ahle no answer unable to LVM mailbox full.

## 2024-01-05 NOTE — Progress Notes (Signed)
 PROGRESS NOTE Matthew Edwards  FMW:[id number] DOB: 1941-04-08 DOA: 01/03/2024 PCP: [name], MD  Brief Narrative/Hospital Course: 83 year old male with chronic HFpEF, pulmonary hypertension, hyperlipidemia, hypertension, persistent A-fib, CAD status post CABG in 2012, diet controlled type 2 diabetes, COPD, chronic hypoxemic respiratory failure on 2 L home oxygen , dementia, OSA, aortic stenosis status post bioprosthetic AVR in 2012, cardiac fibroelastoma. Recently admitted 6/28-12/04/2023 for CHF exacerbation and released from SNF a wk ago present shortness of breath bilateral lower leg edema and altered mental status/confusion worse from baseline. Seen in the ED: Febrile up to 101.2, tachycardic tachypneic needing 4 L nasal cannula,Labs showing no leukocytosis, hemoglobin 9.9 (baseline 11-12), glucose 165, creatinine 1.1, albumin  2.8, troponin negative x 2, BNP 228, INR 2.6, blood cultures in process, ammonia level normal, lactic acid 2.7> 1.2, blood gas with pH 7.44 and PCO2 44, COVID/influenza/RSV PCR negative, UA not suggestive of infection. CXR>>vascular congestion and moderate to large right pleural effusion. Patient was given IV Lasix  40 mg, vancomycin , cefepime , metronidazole , and IV fluids and admitted for acute on chronic HFpEF recurrent moderate to large pleural effusion and sepsis with persistent A-fib with RVR.   Subjective: Seen and examined today Patient was just waking up he is alert awake and pleasantly confused Did not have any complaint Currently on 3 L nasal cannula at 92%, at home on 2 L. Overnight he is afebrile, labs this morning overall stable  Assessment and plan:  Acute on chronic HFpEF/RV failure Recurrent moderate to large pleural effusion Acute on chronic hypoxic respiratory failure due to CHF and pleural effusion Possible sepsis POA: Last echo done on 6/30.  S/p IV Lasix , cont supplemental oxygen  monitor intake output Daily weight. US  guided thoracentesis  ordered AND S/P 2L fluid removal patient appears much improved after the procedure.  LDH 615 although bloody appearance neutrophil 64 with total protein 3.7 By lights criteria exudative as pleural fluid LDH/Serum LHD: 615/434 is 1.4 (is > 0.6), Pleural fluid protein/Serum protein: 3.7/6.2 is is 0.59 (as > 0.5).  No such pneumonia on exam, continue empiric antibiotics pending further Gram stain culture from pleural fluid and cytology. No evidence of UTI COVID respiratory virus panel and influenza negative Pro-Cal negative> if pleural fluid culture negative will discontinue antibiotics. Cont po lasix ,Aldactone  . Cardiology input appreciated.  Acute encephalopathy Dementia: In setting of sepsis CHF complex comorbidities.Delirium precautions fall precautions.CT head-no acute finding  Continue Seroquel - change to at bedtime form am as per daughter as he used to be on night only  Cont gabapentin .  Dysphagia: Speech eval and appreciated now on dysphagia 1 diet.  Continue with aspiration precaution  Persistent A-fib with RVR Bradycardia on last admission following which metoprolol  was discontinued for unknown reason as per daughter: Patient  on Coumadin  but per family EMS was told no longer on Coumadin  but INR therapeutic. Continue Coumadin  to be dosed by pharmacy-is no evidence of bleeding and Hemoglobin stable. Daughter is in agreement four coumadin  use.  Chronic normocytic anemia: Hemoglobin stable around 10 g.Monitor.  CAD CABG Status post AVR in 2012 Mild MR: Last echo from June with EF 55 to 65% trace severe biatrial enlargement question papillary fibroelastoma dilated IVC, stable.  Not currently on aspirin  due to Coumadin  use.  Not on beta-blocker.  Troponin negative x 2.  T2DM: Add sliding scale insulin . No results for input(s): GLUCAP, HGBA1C in the last 168 hours.   COPD: Respiratory status stable continue bronchodilators.  Goals of care Deconditioning/debility: Patient  with frequent hospitalization at  this time is high risk for readmission and decompensation.  Palliative care has been consulted due to advanced age along with dementia and multiple complex co-morbidities Per daughter he has not walked for a while. She is aware he is dying but does not want to talk about it right now and wants to [facility] into 24 hr care.  DVT prophylaxis: SCDs Start: 01/04/24 9365 Code Status:   Code Status: Full Code Family Communication: plan of care discussed with patient at bedside. Daughter [name] updated. Patient status is: Remains hospitalized because of severity of illness. Level of care: Progressive   Dispo: The patient is from: home w/ daughter. She wants to look into 24 hr care.            Anticipated disposition: TBD Objective: Vitals last 24 hrs: Vitals:   01/05/24 0005 01/05/24 0445 01/05/24 0725 01/05/24 0834  BP: 108/69 (!) 142/90 (!) 122/59   Pulse: 87 96    Resp: (!) 22 (!) 22 20   Temp: 98.7 F (37.1 C) 98.9 F (37.2 C) 98.8 F (37.1 C)   TempSrc: Oral Oral Oral   SpO2: 95% 98% 98% 92%  Weight:  75.8 kg    Height:  5' 9 (1.753 m)     Physical Examination: General exam: alert awake, oriented x 1 HEENT:Oral mucosa moist, Ear/Nose WNL grossly Respiratory system: Bilaterally clear BS,no use of accessory muscle Cardiovascular system: S1 & S2 +, No JVD. Gastrointestinal system: Abdomen soft,NT,ND, BS+ Nervous System: Alert, awake, moving all extremities,and following commands. Extremities: LE edema neg,distal peripheral pulses palpable and warm.  Skin: No rashes,no icterus. MSK: Normal muscle bulk,tone, power   Medications reviewed:  Scheduled Meds:  donepezil   5 mg Oral Daily   fluticasone  furoate-vilanterol  1 puff Inhalation Daily   furosemide   20 mg Oral Daily   gabapentin   100 mg Oral QHS   insulin  aspart  0-6 Units Subcutaneous TID WC   [START ON 01/06/2024] QUEtiapine   50 mg Oral QHS   spironolactone   12.5 mg Oral Daily   tiZANidine   2 mg  Oral QHS   Continuous Infusions:  ceFEPime  (MAXIPIME ) IV 2 g (01/05/24 0500)   metronidazole  500 mg (01/04/24 2241)   vancomycin  1,250 mg (01/05/24 0541)   Diet: Diet Order             DIET - DYS 1 Room service appropriate? Yes; Fluid consistency: Thin  Diet effective now                    Data Reviewed: I have personally reviewed following labs and imaging studies ( see epic result tab) CBC: Recent Labs  Lab 01/03/24 2346 01/03/24 2358 01/04/24 1443 01/05/24 0230  WBC 6.6  --  8.6 7.5  NEUTROABS 5.5  --   --   --   HGB 9.9* 11.2* 11.0* 10.6*  HCT 33.4* 33.0* 37.4* 36.1*  MCV 89.5  --  89.9 89.4  PLT 160  --  163 152   CMP: Recent Labs  Lab 01/03/24 2346 01/03/24 2358 01/04/24 1443 01/05/24 0230  NA 140 140 141 140  K 3.8 3.7 4.2 4.2  CL 101  --  99 100  CO2 29  --  33* 33*  GLUCOSE 165*  --  97 122*  BUN 15  --  13 13  CREATININE 1.10  --  0.91 0.97  CALCIUM  9.1  --  9.0 9.1   GFR: Estimated Creatinine Clearance: 60.7 mL/min (by C-G formula based on  SCr of 0.97 mg/dL). Recent Labs  Lab 01/03/24 2346  AST 25  ALT 18  ALKPHOS 78  BILITOT 0.7  PROT 6.2*  ALBUMIN  2.8*   No results for input(s): LIPASE, AMYLASE in the last 168 hours.  Recent Labs  Lab 01/03/24 2346  AMMONIA 32   Coagulation Profile:  Recent Labs  Lab 01/03/24 2346  INR 2.6*   Unresulted Labs (From admission, onward)     Start     Ordered   01/05/24 0500  Basic metabolic panel with GFR  Daily,   R      01/04/24 0729   01/05/24 0500  CBC  Daily,   R      01/04/24 0729   01/04/24 1007  Miscellaneous [facility] test (send-out)  RELEASE UPON ORDERING,   STAT        01/04/24 1007   01/04/24 0639  Urine Culture (for pregnant, neutropenic or urologic patients or patients with an indwelling urinary catheter)  (Urine Labs)  Add-on,   AD       Question:  Indication  Answer:  Sepsis   01/04/24 0638           Antimicrobials/Microbiology: Anti-infectives (From admission,  onward)    Start     Dose/Rate Route Frequency Ordered Stop   01/05/24 0500  vancomycin  (VANCOREADY) IVPB 1250 mg/250 mL        1,250 mg 166.7 mL/hr over 90 Minutes Intravenous Every 24 hours 01/04/24 0659     01/04/24 1300  ceFEPIme  (MAXIPIME ) 2 g in sodium chloride  0.9 % 100 mL IVPB        2 g 200 mL/hr over 30 Minutes Intravenous Every 12 hours 01/04/24 0659     01/04/24 1000  metroNIDAZOLE  (FLAGYL ) IVPB 500 mg        500 mg 100 mL/hr over 60 Minutes Intravenous Every 12 hours 01/04/24 0638     01/03/24 2345  vancomycin  (VANCOREADY) IVPB 1750 mg/350 mL        1,750 mg 175 mL/hr over 120 Minutes Intravenous  Once 01/03/24 2337 01/04/24 0547   01/03/24 2330  ceFEPIme  (MAXIPIME ) 2 g in sodium chloride  0.9 % 100 mL IVPB        2 g 200 mL/hr over 30 Minutes Intravenous  Once 01/03/24 2322 01/04/24 0142   01/03/24 2330  metroNIDAZOLE  (FLAGYL ) IVPB 500 mg        500 mg 100 mL/hr over 60 Minutes Intravenous  Once 01/03/24 2322 01/04/24 0225   01/03/24 2330  vancomycin  (VANCOCIN ) IVPB 1000 mg/200 mL premix  Status:  Discontinued        1,000 mg 200 mL/hr over 60 Minutes Intravenous  Once 01/03/24 2322 01/03/24 2337         Component Value Date/Time   SDES PLEURAL 01/04/2024 1007   SPECREQUEST RIGHT LUNG 01/04/2024 1007   CULT PENDING 01/04/2024 1007   REPTSTATUS PENDING 01/04/2024 1007     Mennie LAMY, MD [facility] 01/05/2024, 10:40 AM

## 2024-01-05 NOTE — Plan of Care (Signed)
  Problem: Clinical Measurements: Goal: Respiratory complications will improve Outcome: Progressing   Problem: Clinical Measurements: Goal: Cardiovascular complication will be avoided Outcome: Progressing   Problem: Coping: Goal: Level of anxiety will decrease Outcome: Progressing   

## 2024-01-05 NOTE — Plan of Care (Signed)
  Problem: Education: Goal: Ability to demonstrate management of disease process will improve Outcome: Progressing   Problem: Nutrition: Goal: Adequate nutrition will be maintained Outcome: Progressing   Problem: Coping: Goal: Level of anxiety will decrease Outcome: Progressing

## 2024-01-05 NOTE — Progress Notes (Signed)
 Patient is requesting a goals of care meeting.  Patient assured this nurse she would be available on 8/7 via telephone

## 2024-01-05 NOTE — Progress Notes (Signed)
 Speech Language Pathology Treatment: Dysphagia  Patient Details Name: Matthew Edwards MRN: [id number] DOB: 03/13/1941 Today's Date: 01/05/2024 Time: 0920-0940 SLP Time Calculation (min) (ACUTE ONLY): 20 min  Assessment / Plan / Recommendation Clinical Impression  Patient seen by SLP for skilled treatment focused on dysphagia goals. Patient had eyes closed and appeared drowsy but was able to be roused and maintain adequate alertness for PO intake. He continues to require total assist with feeding. Mastication with soft solids was prolonged but only trace amount of residuals on lingual surface s/p initial swallows. No overt s/s aspiration with straw sips of thin liquids. Patient appears to tolerate puree solids well with no oral delays or residuals s/p swallows. SLP recommending downgrade to dysphagia 1 (puree) solids, continue with tin liquids. SLP will follow to determine potential for PO consistency advancement.     HPI HPI: Patient is an 83 y.o. male with PMH: dementia, HFpEF, pulmonary hypertension, HLD, HTN, persistent a-fib, CAD s/p CABG in 2012, DM-2, COPD, GERD, chronic hypoxic respiratory failure on 2L home oxygen , OSA, aortic stenosis. He was recently admitted 6/28-12/04/23 with CHF exacerbation. He presented to the hospital on 01/04/2024 from home for evaluation of SOB, bilateral LE edema and AMS/confusion. He was recently discharged home from [facility]. CT head negative, CXR thoracentesis resulted in removal of 2 L clear, blood-laden pleural fluid. Patient made NPO and SLP swallow evaluation ordered.      SLP Plan  Continue with current plan of care          Recommendations  Diet recommendations: Dysphagia 1 (puree);Thin liquid Liquids provided via: Cup;Straw Medication Administration: Other (Comment) (whole or crushed in puree) Supervision: Full supervision/cueing for compensatory strategies;Staff to assist with self feeding Compensations: Slow rate;Small sips/bites Postural  Changes and/or Swallow Maneuvers: Seated upright 90 degrees                  Oral care BID;Staff/trained caregiver to provide oral care   Frequent or constant Supervision/Assistance Dysphagia, unspecified (R13.10)     Continue with current plan of care     [name], MA, CCC-SLP Speech Therapy

## 2024-01-05 NOTE — Progress Notes (Signed)
 Called pt Dtr Ms.Minix again for updates no answer unable to LVM MB full.

## 2024-01-05 NOTE — Consult Note (Signed)
 Consultation Note Date: 01/05/2024   Patient Name: Matthew Edwards  DOB: Jul 10, 1940  MRN: [id number]  Age / Sex: 83 y.o., male  PCP: Matthew Leni Edyth DELENA, MD Referring Physician: Christobal Guadalajara, MD  Reason for Consultation:  GOC  HPI/Patient Profile: 83 y.o. male  with past medical history of CHF, dementia, pulm HTN, HLD, a-fib, CAD, s/p CABG 2012,  DM2, COPD on 2L oxygen  baseline, aortic stenosis,  presented to ED on 01/03/2024 with feet swelling, SOB, confusion. Admitted and being treated for heart failure exacerbation with pleural effusion, encephalopathy, possible sepsis- no clear infections source, afib with RVR on coumadin . Palliative consulted for GOC.   Primary Decision Maker NEXT OF KIN - daughter Matthew Edwards is primary contact   Discussion: Chart reviewed including labs, progress notes, imaging from this and previous encounters.  Patient does not have any Advance Directives or ACP documents.  Chest xray reviewed showing pleural effusion- IR was consulted and he had thoracentesis with 2L removed. UA negative, blood cultures with no growth to date.  Recent hospitalizations 5/13-5/19 and 6/28-7/5 for heart failure exacerbations.  Last ECHO 6/30 reviewed- EF 55-60%, mild mitral regurgitation.  SLP note reviewed- recommendations for dysphagia 1 diet. Mild aspiration risk. On evaluation- patient sleepy but wakes easily. Disoriented. Does not answer questions appropriately, falls back asleep mid speech.  Discussed with RN- patient's daughter expressed being overwhelmed with patient's care at home, looking for additional care resources. I attempted to call patient's daughter- no answer and voicemail was full.  SUMMARY OF RECOMMENDATIONS -CHF exacerbation- this is third hospitalization for same in the last 3 months, would benefit from GOC discussion and Advance Care Planning discussion to include discussion of CODE  STATUS- reviewed attending note indicating that daughter stated she was aware patient was at end of life but did not want to discuss it -PMT will continue to try and reach patient's daughter- discussed with RN   Code Status/Advance Care Planning:   Code Status: Full Code    Prognosis:   Unable to determine  Discharge Planning: To Be Determined  Primary Diagnoses: Present on Admission:  Pleural effusion   Review of Systems  Unable to perform ROS: Mental status change    Physical Exam Vitals reviewed.  Constitutional:      General: He is not in acute distress.    Appearance: He is ill-appearing.  Cardiovascular:     Rate and Rhythm: Normal rate.  Pulmonary:     Effort: Pulmonary effort is normal.  Neurological:     Mental Status: He is disoriented.     Comments: somnolent     Vital Signs: BP 115/75 (BP Location: Right Arm)   Pulse 96   Temp 98.8 F (37.1 C) (Oral)   Resp 20   Ht 5' 9 (1.753 m)   Wt 75.8 kg   SpO2 93%   BMI 24.68 kg/m  Pain Scale: 0-10   Pain Score: Asleep   SpO2: SpO2: 93 % O2 Device:SpO2: 93 % O2 Flow Rate: .O2 Flow Rate (L/min): (S)  3 L/min (Found on 1L - Increased to 3L due to SpO2 of 85%.)  IO: Intake/output summary:  Intake/Output Summary (Last 24 hours) at 01/05/2024 1221 Last data filed at 01/05/2024 0400 Gross per 24 hour  Intake 120 ml  Output 2000 ml  Net -1880 ml    LBM:   Baseline Weight: Weight: 78.2 kg Most recent weight: Weight: 75.8 kg       Thank you for this consult. Palliative medicine will continue to follow and assist as needed.   Signed by: Cassondra Stain, AGNP-C Palliative Medicine  Time includes:   Preparing to see the patient (e.g., review of tests) Obtaining and/or reviewing separately obtained history Performing a medically necessary appropriate examination and/or evaluation Counseling and educating the patient/family/caregiver Ordering medications, tests, or procedures Referring and communicating  with other health care professionals (when not reported separately) Documenting clinical information in the electronic or other health record Independently interpreting results (not reported separately) and communicating results to the patient/family/caregiver Care coordination (not reported separately) Clinical documentation   Please contact Palliative Medicine Team phone at [phone] for questions and concerns.  For individual provider: See Tracey

## 2024-01-05 NOTE — TOC CM/SW Note (Addendum)
 Transition of Care [facility]) - Inpatient Brief Assessment   Patient Details  Name: ANVAY TENNIS MRN: [id number] Date of Birth: [birth year]  Transition of Care [facility]) CM/SW Contact:    Luise JAYSON Pan, LCSWA Phone Number: 01/05/2024, 11:28 AM   Clinical Narrative: Patient is from home with his daughter. Patient has a PCP and insurance on file. Patient uses Development worker, community. Patient has prior SNF and HH hx. Patient has the following DME: walker, wheelchair, home O2.   Per daily meeting with treatment team, Palliative has been consulted. Awaiting PT/OT recs.   CSW and RNCM will continue to follow for discharge needs.   Transition of Care Asessment: Insurance and Status: Insurance coverage has been reviewed Patient has primary care physician: Yes Home environment has been reviewed: Home with daughter Prior level of function:: Modified Independent Prior/Current Home Services: Current home services ([facility] for HHRN, HHPT, HHOT, HHAIDE) Social Drivers of Health Review: SDOH reviewed no interventions necessary Readmission risk has been reviewed: Yes Transition of care needs: transition of care needs identified, TOC will continue to follow

## 2024-01-05 NOTE — Progress Notes (Addendum)
 PHARMACY - ANTICOAGULATION CONSULT NOTE  Pharmacy Consult for warfarin Indication: atrial fibrillation  Allergies  Allergen Reactions   Pineapple Concentrate Nausea And Vomiting    Patient Measurements: Height: 5' 9 (175.3 cm) Weight: 75.8 kg (167 lb 1.7 oz) IBW/kg (Calculated) : 70.7 HEPARIN DW (KG): 75.8  Vital Signs: Temp: 98.8 F (37.1 C) (08/06 1136) Temp Source: Oral (08/06 1136) BP: 115/75 (08/06 1136) Pulse Rate: 96 (08/06 0445)  Labs: Recent Labs    01/03/24 2346 01/03/24 2358 01/04/24 0120 01/04/24 1443 01/05/24 0230 01/05/24 1153  HGB 9.9* 11.2*  --  11.0* 10.6*  --   HCT 33.4* 33.0*  --  37.4* 36.1*  --   PLT 160  --   --  163 152  --   LABPROT 29.5*  --   --   --   --  24.5*  INR 2.6*  --   --   --   --  2.1*  CREATININE 1.10  --   --  0.91 0.97  --   TROPONINIHS 8  --  10  --   --   --     Estimated Creatinine Clearance: 60.7 mL/min (by C-G formula based on SCr of 0.97 mg/dL).   Medical History: Past Medical History:  Diagnosis Date   Arthritis    Atrial fibrillation (HCC)    CAD (coronary artery disease) 2012   Lima-LAD 2 or 3 srents   Complication of anesthesia    loopy after polpy removal dec 2017, lasted several days   COPD (chronic obstructive pulmonary disease) (HCC)    Dementia (HCC)    GERD (gastroesophageal reflux disease)    HEART FAILURE, CONGESTIVE UNSPEC 02/23/2007   Qualifier: Diagnosis of  By: VALMA MD, [facility]     Hyperlipidemia    Hypertension    OSA (obstructive sleep apnea) 11/13/2014   S/P AVR    #25 mm Edwards pericardial valve Bioprosthetic. Dr Lucas.   Situational depression    son passed 11/2013   Sleep apnea    occ uses c pap does not know settings    Stented coronary artery 2013   TOBACCO DEPENDENCE 07/29/2006   Qualifier: History of  By: [place]  MD, Stephanie     Type II diabetes mellitus (HCC)     Medications:  See MAR  Assessment: 83 yo male with pertinent PMH of Afib on warfarin PTA.  Pharmacy  consulted for warfarin dosing.  PTA regimen = 5 mg daily Reported was instructed to stop warfarin and take a full-dose aspirin , however INR of 2.6 on admit suggests adherence PTA.  Patient not able to provide history.  Notable DDI's = flagyl  (8/5 > c)  INR 2.1 today, therapeutic.  Goal of Therapy:  INR 2-3 Monitor platelets by anticoagulation protocol: Yes   Plan:  Warfarin 5 mg PO x 1 Daily INR, CBC  Matthew Edwards U Paytes 01/05/2024,3:05 PM

## 2024-01-05 NOTE — Progress Notes (Signed)
 PT Cancellation Note  Patient Details Name: Matthew Edwards MRN: [id number] DOB: [birth year]   Cancelled Treatment:    Reason Eval/Treat Not Completed: Patient declined, no reason specified. Pt declines mobility at this time, reporting he does not feel good and wants to be taken home. PT attempts to provide encouragement and education on the importance of mobilization however the pt continues to refuse. PT will follow up as time allows.   Bernardino JINNY Ruth 01/05/2024, 1:42 PM

## 2024-01-06 DIAGNOSIS — J90 Pleural effusion, not elsewhere classified: Secondary | ICD-10-CM

## 2024-01-06 DIAGNOSIS — Z7189 Other specified counseling: Secondary | ICD-10-CM | POA: Diagnosis not present

## 2024-01-06 DIAGNOSIS — G9340 Encephalopathy, unspecified: Secondary | ICD-10-CM | POA: Diagnosis not present

## 2024-01-06 DIAGNOSIS — F03B0 Unspecified dementia, moderate, without behavioral disturbance, psychotic disturbance, mood disturbance, and anxiety: Secondary | ICD-10-CM

## 2024-01-06 DIAGNOSIS — I5033 Acute on chronic diastolic (congestive) heart failure: Secondary | ICD-10-CM | POA: Diagnosis not present

## 2024-01-06 LAB — BASIC METABOLIC PANEL WITH GFR
Anion gap: 6 (ref 5–15)
BUN: 14 mg/dL (ref 8–23)
CO2: 34 mmol/L — ABNORMAL HIGH (ref 22–32)
Calcium: 9 mg/dL (ref 8.9–10.3)
Chloride: 99 mmol/L (ref 98–111)
Creatinine, Ser: 0.77 mg/dL (ref 0.61–1.24)
GFR, Estimated: >60 mL/min (ref >60)
Glucose, Bld: 139 mg/dL — ABNORMAL HIGH (ref 70–99)
Potassium: 4 mmol/L (ref 3.5–5.1)
Sodium: 139 mmol/L (ref 135–145)

## 2024-01-06 LAB — CBC
HCT: 32.3 % — ABNORMAL LOW (ref 39.0–52.0)
Hemoglobin: 9.6 g/dL — ABNORMAL LOW (ref 13.0–17.0)
MCH: 26.2 pg (ref 26.0–34.0)
MCHC: 29.7 g/dL — ABNORMAL LOW (ref 30.0–36.0)
MCV: 88.3 fL (ref 80.0–100.0)
Platelets: 130 K/uL — ABNORMAL LOW (ref 150–400)
RBC: 3.66 MIL/uL — ABNORMAL LOW (ref 4.22–5.81)
RDW: 16.5 % — ABNORMAL HIGH (ref 11.5–15.5)
WBC: 6.7 K/uL (ref 4.0–10.5)
nRBC: 0 % (ref 0.0–0.2)

## 2024-01-06 LAB — GLUCOSE, CAPILLARY
Glucose-Capillary: 111 mg/dL — ABNORMAL HIGH (ref 70–99)
Glucose-Capillary: 127 mg/dL — ABNORMAL HIGH (ref 70–99)
Glucose-Capillary: 134 mg/dL — ABNORMAL HIGH (ref 70–99)
Glucose-Capillary: 116 mg/dL — ABNORMAL HIGH (ref 70–99)

## 2024-01-06 LAB — PROTIME-INR
INR: 1.8 — ABNORMAL HIGH (ref 0.8–1.2)
Prothrombin Time: 21.4 s — ABNORMAL HIGH (ref 11.4–15.2)

## 2024-01-06 MED ORDER — WARFARIN SODIUM 5 MG PO TABS
5.0000 mg | ORAL_TABLET | Freq: Once | ORAL | Status: AC
  Administered 2024-01-06: 5 mg via ORAL
  Filled 2024-01-06: qty 1

## 2024-01-06 MED ORDER — SODIUM CHLORIDE 0.9 % IV SOLN
2.0000 g | Freq: Three times a day (TID) | INTRAVENOUS | Status: AC
  Administered 2024-01-06 (×2): 2 g via INTRAVENOUS
  Filled 2024-01-06 (×2): qty 12.5

## 2024-01-06 NOTE — Progress Notes (Signed)
 PHARMACY - ANTICOAGULATION CONSULT NOTE  Pharmacy Consult for warfarin Indication: atrial fibrillation  Allergies  Allergen Reactions   Pineapple Concentrate Nausea And Vomiting    Patient Measurements: Height: 5' 9 (175.3 cm) Weight: 74.9 kg (165 lb 2 oz) IBW/kg (Calculated) : 70.7 HEPARIN DW (KG): 75.8  Vital Signs: Temp: 98 F (36.7 C) (08/07 0730) Temp Source: Oral (08/07 0730) BP: 104/69 (08/07 0730) Pulse Rate: 109 (08/07 0730)  Labs: Recent Labs    01/03/24 2346 01/03/24 2358 01/04/24 0120 01/04/24 1443 01/05/24 0230 01/05/24 1153 01/06/24 0238 01/06/24 0802  HGB 9.9*   < >  --  11.0* 10.6*  --  9.6*  --   HCT 33.4*   < >  --  37.4* 36.1*  --  32.3*  --   PLT 160  --   --  163 152  --  130*  --   LABPROT 29.5*  --   --   --   --  24.5*  --  21.4*  INR 2.6*  --   --   --   --  2.1*  --  1.8*  CREATININE 1.10  --   --  0.91 0.97  --  0.77  --   TROPONINIHS 8  --  10  --   --   --   --   --    < > = values in this interval not displayed.    Estimated Creatinine Clearance: 73.6 mL/min (by C-G formula based on SCr of 0.77 mg/dL).   Medical History: Past Medical History:  Diagnosis Date   Arthritis    Atrial fibrillation (HCC)    CAD (coronary artery disease) 2012   Lima-LAD 2 or 3 srents   Complication of anesthesia    loopy after polpy removal dec 2017, lasted several days   COPD (chronic obstructive pulmonary disease) (HCC)    Dementia (HCC)    GERD (gastroesophageal reflux disease)    HEART FAILURE, CONGESTIVE UNSPEC 02/23/2007   Qualifier: Diagnosis of  By: VALMA MD, [facility]     Hyperlipidemia    Hypertension    OSA (obstructive sleep apnea) 11/13/2014   S/P AVR    #25 mm Edwards pericardial valve Bioprosthetic. Dr Lucas.   Situational depression    son passed 11/2013   Sleep apnea    occ uses c pap does not know settings    Stented coronary artery 2013   TOBACCO DEPENDENCE 07/29/2006   Qualifier: History of  By: [place]  MD, Stephanie      Type II diabetes mellitus (HCC)     Medications:  See MAR  Assessment: 83 yo male with pertinent PMH of Afib on warfarin PTA.  Pharmacy consulted for warfarin dosing.  PTA regimen = 5 mg daily Reported was instructed to stop warfarin and take a full-dose aspirin , however INR of 2.6 on admit suggests adherence PTA.  Patient not able to provide history. To continue warfarin daily dosing.  Notable DDI's = flagyl  (8/5 > c)  Ordered warfarin 5mg  x1 for evening of 8/6, patient never received dose. Explains the drop in INR to 1.8 today, subtherapeutic.  Goal of Therapy:  INR 2-3 Monitor platelets by anticoagulation protocol: Yes   Plan:  Warfarin 5 mg PO x 1 Daily INR, CBC Monitor H&H, plts, signs and symptoms of bleeding  Penny Frisbie, PharmD PGY-1 Pharmacy Resident [facility]  01/06/2024 10:46 AM

## 2024-01-06 NOTE — Evaluation (Signed)
 Occupational Therapy Evaluation Patient Details Name: Matthew Edwards MRN: [id number] DOB: 20-Jan-1941 Today's Date: 01/06/2024   History of Present Illness   83 y.o. male adm 01/03/24 with SOB, LB edema, AMS, acute on chronic HFpEF and Afib with RVR. 8/5 Thoracentesis. PMHx: AVR, CAD s/p CABG, chronic hypoxic respiratory failure 2 L O2, COPD, HFpEF, PAF, cardiac fibroelastoma, GAD, DMII, peripheral neuropathy, dementia     Clinical Impressions Pt admitted based on above, and was seen based on problem list below. Pt recently d/c from [facility] at snf, since d/c pt has been requiring up to mod assist for ADLs and assistance with transfers and mobility with RW. Today pt is requiring set up  to mod +2  for ADLs. Bed mobility was mod assist and functional transfers are up to  mod +2  with RW. Pt only oriented towards self during session, following simple commands well, but required cues for goal oriented task. Recommending pt complete <3 hours of skilled rehab daily prior to d/c home. OT will continue to follow acutely to maximize functional independence.        If plan is discharge home, recommend the following:   Two people to help with walking and/or transfers;Two people to help with bathing/dressing/bathroom;Supervision due to cognitive status     Functional Status Assessment   Patient has had a recent decline in their functional status and demonstrates the ability to make significant improvements in function in a reasonable and predictable amount of time.     Equipment Recommendations   Other (comment) (Defer to next venue)      Precautions/Restrictions   Precautions Precautions: Fall Recall of Precautions/Restrictions: Impaired Restrictions Weight Bearing Restrictions Per Provider Order: No     Mobility Bed Mobility Overal bed mobility: Needs Assistance Bed Mobility: Supine to Sit     Supine to sit: Mod assist     General bed mobility comments: Mod HH assist for trunk,  pt able to scoot to EOB at supervision    Transfers Overall transfer level: Needs assistance   Transfers: Sit to/from Stand, Bed to chair/wheelchair/BSC Sit to Stand: Mod assist, +2 physical assistance     Step pivot transfers: +2 physical assistance, +2 safety/equipment, Min assist     General transfer comment: Light mod +2 to come to stand from slightly elevated bed height, min +2 cueing for safety with transfer, no knee buckling      Balance Overall balance assessment: Needs assistance Sitting-balance support: No upper extremity supported, Feet supported Sitting balance-Leahy Scale: Poor Sitting balance - Comments: Sitting EOB CGA, posterior lean with dynamic task Postural control: Posterior lean Standing balance support: Bilateral upper extremity supported, During functional activity, Reliant on assistive device for balance Standing balance-Leahy Scale: Poor Standing balance comment: RW support in standing           ADL either performed or assessed with clinical judgement   ADL Overall ADL's : Needs assistance/impaired Eating/Feeding: Set up;Sitting   Grooming: Set up;Sitting   Upper Body Bathing: Set up;Sitting   Lower Body Bathing: Moderate assistance;Sitting/lateral leans   Upper Body Dressing : Set up;Sitting   Lower Body Dressing: Moderate assistance;+2 for physical assistance;Sit to/from stand   Toilet Transfer: Moderate assistance;+2 for physical assistance;+2 for safety/equipment;Rolling walker (2 wheels);Ambulation Toilet Transfer Details (indicate cue type and reason): Short distance step pivot simulated to recliner, mod +2 for stand, min +2 step pivot         Functional mobility during ADLs: Moderate assistance;Rolling walker (2 wheels);+2 for physical assistance;+2  for safety/equipment General ADL Comments: poor to fair sitting balance for EOB ADLs     Vision Baseline Vision/History: 0 No visual deficits Patient Visual Report: No change from  baseline Vision Assessment?: No apparent visual deficits            Pertinent Vitals/Pain Pain Assessment Pain Assessment: No/denies pain     Extremity/Trunk Assessment Upper Extremity Assessment Upper Extremity Assessment: Generalized weakness   Lower Extremity Assessment Lower Extremity Assessment: Defer to PT evaluation   Cervical / Trunk Assessment Cervical / Trunk Assessment: Kyphotic   Communication Communication Communication: Impaired Factors Affecting Communication: Hearing impaired   Cognition Arousal: Alert Behavior During Therapy: Flat affect Cognition: History of cognitive impairments     OT - Cognition Comments: Pt with hx of dementia, only oriented to self, follows simple commands,  can attend to task but frequent cues for reorientation to goals       Following commands: Impaired Following commands impaired: Follows one step commands with increased time, Follows one step commands inconsistently     Cueing  General Comments   Cueing Techniques: Verbal cues;Gestural cues;Tactile cues  VSS on 2L           Home Living Family/patient expects to be discharged to:: Private residence Living Arrangements: Children Available Help at Discharge: Family;Available PRN/intermittently Type of Home: House Home Access: Level entry     Home Layout: One level     Bathroom Shower/Tub: Chief Strategy Officer: Standard Bathroom Accessibility: Yes How Accessible: Accessible via walker Home Equipment: Rolling Walker (2 wheels);Shower seat   Additional Comments: pt recently released from [facility]      Prior Functioning/Environment Prior Level of Function : Needs assist             Mobility Comments: assist with transfers, used RW, but occasionally forgets can normally get up and get to kitchen table ADLs Comments: Daughter assist with medication management, meals, IADL, showers    OT Problem List: Decreased strength;Decreased range of  motion;Decreased activity tolerance;Impaired balance (sitting and/or standing);Decreased safety awareness;Cardiopulmonary status limiting activity   OT Treatment/Interventions: Self-care/ADL training;Therapeutic exercise;Energy conservation;DME and/or AE instruction;Therapeutic activities;Patient/family education;Balance training      OT Goals(Current goals can be found in the care plan section)   Acute Rehab OT Goals Patient Stated Goal: None stated OT Goal Formulation: Patient unable to participate in goal setting Time For Goal Achievement: 01/20/24 Potential to Achieve Goals: Good   OT Frequency:  Min 2X/week       AM-PAC OT 6 Clicks Daily Activity     Outcome Measure Help from another person eating meals?: None Help from another person taking care of personal grooming?: A Little Help from another person toileting, which includes using toliet, bedpan, or urinal?: Total Help from another person bathing (including washing, rinsing, drying)?: A Lot Help from another person to put on and taking off regular upper body clothing?: A Little Help from another person to put on and taking off regular lower body clothing?: A Lot 6 Click Score: 15   End of Session Equipment Utilized During Treatment: Gait belt;Rolling walker (2 wheels);Oxygen  (2L) Nurse Communication: Mobility status  Activity Tolerance: Patient tolerated treatment well Patient left: in chair;with call bell/phone within reach;with chair alarm set  OT Visit Diagnosis: Unsteadiness on feet (R26.81);Other abnormalities of gait and mobility (R26.89);Muscle weakness (generalized) (M62.81)                Time: 8555-8488 OT Time Calculation (min): 27 min Charges:  OT  General Charges $OT Visit: 1 Visit OT Evaluation $OT Eval Moderate Complexity: 1 Mod OT Treatments $Self Care/Home Management : 8-22 mins  [name], OT  Acute Rehabilitation Services Office [phone] Secure chat preferred   [name] 01/06/2024, 4:48 PM

## 2024-01-06 NOTE — TOC Initial Note (Addendum)
 Transition of Care [facility]) - Initial/Assessment Note    Patient Details  Name: Matthew Edwards MRN: [id number] Date of Birth: [birth year]  [facility] CM/SW Contact:    Luise JAYSON Pan, LCSWA Phone Number: 01/06/2024, 10:19 AM  Clinical Narrative:     PT rec'd SNF. Patient oriented x1. CSW left VM for patitents daughter, [name] to discuss recs for SNF and that patient may be in copay days.   11:16 AM CSW spoke with patiens pts dtr who is HCPOA, [name] Heino [phone]), about PT recs for SNF short term rehab. Kevin is agreeable and would like CSW to send referral to [facility] and rehab and [place]. CSW informed [name] that patient may potentially be in copay days but will follow up once facilities inform CSW if patient is in copay days or not.   [name] stated that she does not want her father returning home with her sister, [name] at this time due to [facility] own medical condition. [name] stated that she would like her father to move with her but family has not made efforts to drive him to her.   3:10 PM CSW spoke with [name] and she will be meeting Palliative closer to 4:30PM. CSW updated [name] on SNF bed offers. [facility] declined but [facility] accepted. [name] stated [facility] is fine. CSW to submit for insurance auth once patient is more medically stable.   CSW will continue to follow.   Expected Discharge Plan: Skilled Nursing Facility Barriers to Discharge: Continued Medical Work up, SNF Pending bed offer, English as a second language teacher, Other (must enter comment) (Copay days?)   Patient Goals and CMS Choice Patient states their goals for this hospitalization and ongoing recovery are:: Unable to assess          Expected Discharge Plan and Services In-house Referral: Clinical Social Work, Hospice / Palliative Care     Living arrangements for the past 2 months: Apartment                                      Prior Living Arrangements/Services Living arrangements  for the past 2 months: Apartment Lives with:: Adult Children Patient language and need for interpreter reviewed:: Yes Do you feel safe going back to the place where you live?: Yes      Need for Family Participation in Patient Care: Yes (Comment) Care giver support system in place?: Yes (comment) Current home services: DME (walker) Criminal Activity/Legal Involvement Pertinent to Current Situation/Hospitalization: No - Comment as needed  Activities of Daily Living      Permission Sought/Granted Permission sought to share information with : Family Supports Permission granted to share information with : No (Patient Ox1; family contact in chart)  Share Information with NAME: Rocklin Soderquist     Permission granted to share info w Relationship: Daughter  Permission granted to share info w Contact Information: [phone]  Emotional Assessment Appearance:: Appears stated age Attitude/Demeanor/Rapport: Unable to Assess Affect (typically observed): Unable to Assess Orientation: : Oriented to Self Alcohol  / Substance Use: Not Applicable Psych Involvement: No (comment)  Admission diagnosis:  Syncope [R55] Pleural effusion [J90] Patient Active Problem List   Diagnosis Date Noted   Pleural effusion 01/04/2024   Acute on chronic respiratory failure with hypoxia (HCC) 01/04/2024   Sepsis (HCC) 01/04/2024   Acute encephalopathy 01/04/2024   Type 2 diabetes mellitus with hyperglycemia, with long-term current use of insulin  (HCC) 01/04/2024   Dementia (  HCC) 01/04/2024   Benign hypertension 01/04/2024   History of aortic valve replacement 01/04/2024   Aortic valve disease 01/04/2024   Permanent atrial fibrillation (HCC) 01/04/2024   Chronic heart failure with preserved ejection fraction (HFpEF) (HCC) 01/04/2024   Acute on chronic diastolic CHF (congestive heart failure) (HCC) 11/28/2023   Bilateral pleural effusion 11/28/2023   History of CAD (coronary artery disease) 11/28/2023   History of  COPD 11/28/2023   Chronic hypoxic respiratory failure (HCC) 11/28/2023   Generalized anxiety disorder 11/28/2023   Peripheral neuropathy 11/28/2023   Hypokalemia 11/28/2023   Acute exacerbation of CHF (congestive heart failure) (HCC) 11/28/2023   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   Acute on chronic heart failure with preserved ejection fraction (HFpEF) (HCC) 10/14/2023   Abnormal echocardiogram 10/14/2023   History of coronary artery stent placement 10/14/2023   Dementia without behavioral disturbance (HCC) 10/14/2023   Non-insulin  dependent type 2 diabetes mellitus (HCC) 10/14/2023   Anticoagulated on Coumadin  10/14/2023   Chronic health problem 10/12/2023   Acute hypoxemic respiratory failure (HCC) 10/12/2023   Dysuria 08/13/2020   Lower extremity weakness 03/18/2020   Incontinence, feces 03/12/2020   Cerumen impaction 11/23/2019   Weight loss 11/21/2019   Adenomatous rectal polyp with high grade dysplasia s/p TEM resection 05/26/2016 05/26/2016   Spinal stenosis of lumbar region 11/28/2015   OSA (obstructive sleep apnea) 11/13/2014   Pulmonary HTN (HCC) 08/24/2014   Acute congestive heart failure (HCC) 08/02/2014   Cognitive impairment 07/02/2014   S/P AVR 12/29/2012   Paroxysmal atrial fibrillation (HCC)    Type 2 diabetes mellitus with diabetic neuropathy, unspecified (HCC) 11/25/2011   Vitamin D  deficiency 02/21/2010   Hypercalcemia 05/29/2009   Essential hypertension 02/23/2007   Coronary atherosclerosis 12/15/2006   GERD 12/15/2006   COPD (chronic obstructive pulmonary disease) (HCC) 12/10/2006   HLD (hyperlipidemia) 07/29/2006   Alcohol  abuse 07/29/2006   Aortic valve disorder 07/29/2006   PCP:  [name], MD Pharmacy:   [facility] - [place], [place] - [address] [place] [address] Phone: [phone] Fax: [phone]  [id number] - [place], [place] - [address] AT  [facility] [address] [place] [address] Phone: [phone] Fax: [phone]     Social Drivers of Health (SDOH) Social History: SDOH Screenings   Food Insecurity: Patient Unable To Answer (01/05/2024)  Housing: Unknown (01/05/2024)  Transportation Needs: Patient Unable To Answer (01/05/2024)  Utilities: Patient Unable To Answer (01/05/2024)  Depression (PHQ2-9): Medium Risk (11/19/2020)  Financial Resource Strain: Low Risk  (12/09/2022)   Received from [facility]  Social Connections: Patient Unable To Answer (01/05/2024)  Stress: No Stress Concern Present (03/22/2022)   Received from [facility]  Tobacco Use: Medium Risk (01/04/2024)   SDOH Interventions:     Readmission Risk Interventions    11/30/2023    1:16 PM  Readmission Risk Prevention Plan  Transportation Screening Complete  PCP or Specialist Appt within 3-5 Days Complete  HRI or Home Care Consult Complete  Palliative Care Screening Not Applicable  Medication Review (RN Care Manager) Complete

## 2024-01-06 NOTE — Progress Notes (Signed)
 Daily Progress Note   Patient Name: Matthew Edwards       Date: 01/06/2024 DOB: 1941/01/09  Age: 83 y.o. MRN#: [id number] Attending Physician: Christobal Guadalajara, MD Primary Care Physician: [name], MD Admit Date: 01/03/2024  Reason for Consultation/Follow-up: Establishing goals of care  Patient Profile/HPI:  83 y.o. male  with past medical history of CHF, dementia, pulm HTN, HLD, a-fib, CAD, s/p CABG 2012,  DM2, COPD on 2L oxygen  baseline, aortic stenosis,  presented to ED on 01/03/2024 with feet swelling, SOB, confusion. Admitted and being treated for heart failure exacerbation with pleural effusion, encephalopathy, possible sepsis- no clear infections source, afib with RVR on coumadin . Palliative consulted for GOC.   Subjective: Chart reviewed including labs, progress notes, imaging from this and previous encounters.  Labs- stable renal function Cr. 0.77, BUN 14, GFR >60, CBC with hgb low 9.6. Per LCSW note- patient has bed offer at [facility].  Met with patient's HCPOA [name] and his daughter [name] at bedside.  On eval pt sitting up in chair- awake and alert. Answers some questions but unable to engage in complex conversation.  He was living with his daughter [name] prior to admission. Was able to ambulate in the home. Required assistance with ADLs. Enjoyed sports- [employer] and [place] are his favorite teams.  We discussed his current acute illness in the setting of his progressive chronic illnesses. I reviewed pathophysiology and trajectory of heart failure with repeat exacerbations that become more frequent over time. His condition is also complicated by his dementia.  His daughters acknowledge that his dementia is getting worse and they cannot care for him at home- their hope is to have 24  hour care for him.  Advanced Care Planning- 45 mins With daughters' permission  ACP was discussed due to his multiple comorbidities. His daughters are aware of his worsening heart failure that is likely to continue recur. They do not want him to suffer. If he were to discharge to SNF and the return to the hospital they would wish to transition to full comfort measures only and hospice.  Code status was discussed- for now they wish to keep him full code due to pending d/c to nursing facility- however, when he declines again and returns to hospital- they wish to implement DNR status and comfort measures only.  We discussed referral for outpatient Palliative to follow at SNF and they are in agreement.   ROS   Physical Exam Vitals and nursing note reviewed.  Constitutional:      Appearance: He is ill-appearing.  Cardiovascular:     Rate and Rhythm: Normal rate.  Pulmonary:     Breath sounds: Wheezing present.  Neurological:     Mental Status: He is alert. Mental status is at baseline.             Vital Signs: BP 98/78 (BP Location: Left Arm)   Pulse 73   Temp 98 F (36.7 C) (Oral)   Resp (!) 33   Ht 5' 9 (1.753 m)   Wt 74.9 kg   SpO2 95%   BMI 24.38 kg/m  SpO2: SpO2: 95 % O2 Device: O2 Device: Nasal Cannula O2 Flow Rate: O2 Flow Rate (L/min): 2 L/min  Intake/output summary:  Intake/Output Summary (Last 24 hours) at 01/06/2024 1728 Last data filed at 01/06/2024 1500 Gross per 24 hour  Intake 390 ml  Output 1000 ml  Net -610 ml   LBM: Last BM Date :  (Unknown pt has memory Inpairment) Baseline Weight: Weight: 78.2 kg Most recent weight: Weight: 74.9 kg       Palliative Assessment/Data: PPS: 30%      Patient Active Problem List   Diagnosis Date Noted   Pleural effusion 01/04/2024   Acute on chronic respiratory failure with hypoxia (HCC) 01/04/2024   Sepsis (HCC) 01/04/2024   Acute encephalopathy 01/04/2024   Type 2 diabetes mellitus with hyperglycemia, with  long-term current use of insulin  (HCC) 01/04/2024   Dementia (HCC) 01/04/2024   Benign hypertension 01/04/2024   History of aortic valve replacement 01/04/2024   Aortic valve disease 01/04/2024   Permanent atrial fibrillation (HCC) 01/04/2024   Chronic heart failure with preserved ejection fraction (HFpEF) (HCC) 01/04/2024   Acute on chronic diastolic CHF (congestive heart failure) (HCC) 11/28/2023   Bilateral pleural effusion 11/28/2023   History of CAD (coronary artery disease) 11/28/2023   History of COPD 11/28/2023   Chronic hypoxic respiratory failure (HCC) 11/28/2023   Generalized anxiety disorder 11/28/2023   Peripheral neuropathy 11/28/2023   Hypokalemia 11/28/2023   Acute exacerbation of CHF (congestive heart failure) (HCC) 11/28/2023   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   Acute on chronic heart failure with preserved ejection fraction (HFpEF) (HCC) 10/14/2023   Abnormal echocardiogram 10/14/2023   History of coronary artery stent placement 10/14/2023   Dementia without behavioral disturbance (HCC) 10/14/2023   Non-insulin  dependent type 2 diabetes mellitus (HCC) 10/14/2023   Anticoagulated on Coumadin  10/14/2023   Chronic health problem 10/12/2023   Acute hypoxemic respiratory failure (HCC) 10/12/2023   Dysuria 08/13/2020   Lower extremity weakness 03/18/2020   Incontinence, feces 03/12/2020   Cerumen impaction 11/23/2019   Weight loss 11/21/2019   Adenomatous rectal polyp with high grade dysplasia s/p TEM resection 05/26/2016 05/26/2016   Spinal stenosis of lumbar region 11/28/2015   OSA (obstructive sleep apnea) 11/13/2014   Pulmonary HTN (HCC) 08/24/2014   Acute congestive heart failure (HCC) 08/02/2014   Cognitive impairment 07/02/2014   S/P AVR 12/29/2012   Paroxysmal atrial fibrillation (HCC)    Type 2 diabetes mellitus with diabetic neuropathy, unspecified (HCC) 11/25/2011   Vitamin D  deficiency 02/21/2010   Hypercalcemia 05/29/2009   Essential  hypertension 02/23/2007   Coronary atherosclerosis 12/15/2006   GERD 12/15/2006   COPD (chronic obstructive pulmonary disease) (HCC) 12/10/2006   HLD (hyperlipidemia) 07/29/2006   Alcohol   abuse 07/29/2006   Aortic valve disorder 07/29/2006    Palliative Care Assessment & Plan    Assessment/Recommendations/Plan  Advanced heart failure, dementia- goc are stabilize and d/c to SNF/rehab with palliative Full code for now- when returns to hospital then would prefer DNR and comfort measures only   Code Status:   Code Status: Full Code   Prognosis:  Unable to determine- likley months due to his frequent rehospitalizations  Discharge Planning: Skilled Nursing Facility for rehab with Palliative care service follow-up  Care plan was discussed with patient's family and care team.   Thank you for allowing the Palliative Medicine Team to assist in the care of this patient.  Total time:  Prolonged billing:  Time includes:   Preparing to see the patient (e.g., review of tests) Obtaining and/or reviewing separately obtained history Performing a medically necessary appropriate examination and/or evaluation Counseling and educating the patient/family/caregiver Ordering medications, tests, or procedures Referring and communicating with other health care professionals (when not reported separately) Documenting clinical information in the electronic or other health record Independently interpreting results (not reported separately) and communicating results to the patient/family/caregiver Care coordination (not reported separately) Clinical documentation  Cassondra Stain, AGNP-C Palliative Medicine   Please contact Palliative Medicine Team phone at [phone] for questions and concerns.

## 2024-01-06 NOTE — Evaluation (Addendum)
 Physical Therapy Evaluation Patient Details Name: Matthew Edwards MRN: [id number] DOB: 1941-03-18 Today's Date: 01/06/2024  History of Present Illness  83 y.o. male adm 01/03/24 with SOB, LB edema, AMS, acute on chronic HFpEF and Afib with RVR. 8/5 Thoracentesis. PMHx: AVR, CAD s/p CABG, chronic hypoxic respiratory failure 2 L O2, COPD, HFpEF, PAF, cardiac fibroelastoma, GAD, DMII, peripheral neuropathy, dementia  Clinical Impression  Pt pleasant with flat affect, oriented to self without family present. Per chart and via conversation with son on phone pt from home and had a brief SNF stay and was receiving assist for transfers and ADLs but family not present 24hrs and daughter with own health issues. Pt not overly receptive to mobility this date stating fatigue but able to get to EOB, stand and step toward [facility] with max -mod +2 assist. Pt can support weight in standing with RW but buckled with stepping. Pt with decreased strength, function, activity tolerance and transfers who will benefit from acute therapy to maximize mobility and safety. Patient will benefit from continued inpatient follow up therapy, <3 hours/day.    HR 118-130 SPO2 98% on 2L      If plan is discharge home, recommend the following: A lot of help with walking and/or transfers;A lot of help with bathing/dressing/bathroom;Assistance with feeding;Assistance with cooking/housework;Direct supervision/assist for financial management;Assist for transportation;Supervision due to cognitive status   Can travel by private vehicle   No    Equipment Recommendations Hospital bed;Hoyer lift;Wheelchair (measurements PT);Wheelchair cushion (measurements PT)  Recommendations for Other Services       Functional Status Assessment Patient has had a recent decline in their functional status and/or demonstrates limited ability to make significant improvements in function in a reasonable and predictable amount of time     Precautions /  Restrictions Precautions Precautions: Fall Recall of Precautions/Restrictions: Impaired      Mobility  Bed Mobility Overal bed mobility: Needs Assistance Bed Mobility: Supine to Sit, Rolling, Sit to Supine Rolling: Mod assist   Supine to sit: Max assist Sit to supine: Mod assist   General bed mobility comments: max assist to clear legs and trunk from surface to sit EOB with pivoting. return to bed mod assist to lift legs and to roll for linen placement    Transfers Overall transfer level: Needs assistance   Transfers: Sit to/from Stand Sit to Stand: Mod assist, +2 physical assistance           General transfer comment: mod +2 to stand with use of pad at sacrum, pt requires assist to initiate and power up. Pt can maintain standing with RW present but buckles with side stepping toward HOB. Pt denied OOB to chair    Ambulation/Gait               General Gait Details: unable this date  Stairs            Wheelchair Mobility     Tilt Bed    Modified Rankin (Stroke Patients Only)       Balance Overall balance assessment: Needs assistance Sitting-balance support: No upper extremity supported, Feet supported Sitting balance-Leahy Scale: Poor Sitting balance - Comments: mod assist sitting due to posterior bias Postural control: Posterior lean Standing balance support: Bilateral upper extremity supported, During functional activity, Reliant on assistive device for balance Standing balance-Leahy Scale: Poor Standing balance comment: RW support in standing  Pertinent Vitals/Pain Pain Assessment Pain Assessment: PAINAD Breathing: normal Negative Vocalization: none Facial Expression: smiling or inexpressive Body Language: relaxed Consolability: no need to console PAINAD Score: 0    Home Living Family/patient expects to be discharged to:: Private residence Living Arrangements: Children Available Help at  Discharge: Family;Available PRN/intermittently Type of Home: House Home Access: Level entry       Home Layout: One level Home Equipment: Agricultural consultant (2 wheels);Shower seat Additional Comments: [facility], family interested in different SNF    Prior Function Prior Level of Function : Needs assist             Mobility Comments: assist with transfers, used RW, but occasionally forgets can normally get up and get to kitchen table ADLs Comments: Daughter assist with medication management, meals, IADL, showers     Extremity/Trunk Assessment   Upper Extremity Assessment Upper Extremity Assessment: Generalized weakness    Lower Extremity Assessment Lower Extremity Assessment: Generalized weakness    Cervical / Trunk Assessment Cervical / Trunk Assessment: Kyphotic  Communication   Communication Communication: Impaired Factors Affecting Communication: Hearing impaired    Cognition Arousal: Alert Behavior During Therapy: Flat affect   PT - Cognitive impairments: No family/caregiver present to determine baseline, Problem solving, Orientation, Awareness, Memory, Safety/Judgement   Orientation impairments: Place, Time, Situation                     Following commands: Impaired Following commands impaired: Follows one step commands with increased time, Follows one step commands inconsistently     Cueing Cueing Techniques: Verbal cues, Gestural cues, Tactile cues     General Comments      Exercises     Assessment/Plan    PT Assessment Patient needs continued PT services  PT Problem List Decreased coordination;Decreased strength;Decreased activity tolerance;Decreased balance;Decreased mobility;Decreased safety awareness;Decreased knowledge of use of DME;Decreased cognition       PT Treatment Interventions DME instruction;Functional mobility training;Therapeutic activities;Therapeutic exercise;Neuromuscular re-education;Balance  training;Patient/family education;Cognitive remediation;Gait training    PT Goals (Current goals can be found in the Care Plan section)  Acute Rehab PT Goals PT Goal Formulation: Patient unable to participate in goal setting Time For Goal Achievement: 01/20/24 Potential to Achieve Goals: Fair    Frequency Min 1X/week     Co-evaluation               AM-PAC PT 6 Clicks Mobility  Outcome Measure Help needed turning from your back to your side while in a flat bed without using bedrails?: A Lot Help needed moving from lying on your back to sitting on the side of a flat bed without using bedrails?: Total Help needed moving to and from a bed to a chair (including a wheelchair)?: Total Help needed standing up from a chair using your arms (e.g., wheelchair or bedside chair)?: Total Help needed to walk in hospital room?: Total Help needed climbing 3-5 steps with a railing? : Total 6 Click Score: 7    End of Session Equipment Utilized During Treatment: Oxygen  Activity Tolerance: Patient tolerated treatment well Patient left: in bed;with call bell/phone within reach;with bed alarm set Nurse Communication: Need for lift equipment PT Visit Diagnosis: Other abnormalities of gait and mobility (R26.89);Difficulty in walking, not elsewhere classified (R26.2);Muscle weakness (generalized) (M62.81)    Time: 9152-9097 PT Time Calculation (min) (ACUTE ONLY): 15 min   Charges:   PT Evaluation $PT Eval Moderate Complexity: 1 Mod   PT General Charges $$ ACUTE PT VISIT:  1 Visit         Lenoard SQUIBB, PT Acute Rehabilitation Services Office: [phone]   Lenoard KATHEE Docker 01/06/2024, 10:05 AM

## 2024-01-06 NOTE — Plan of Care (Signed)
  Problem: Education: Goal: Ability to demonstrate management of disease process will improve Outcome: Progressing   Problem: Education: Goal: Ability to verbalize understanding of medication therapies will improve Outcome: Progressing   Problem: Activity: Goal: Capacity to carry out activities will improve Outcome: Progressing   Problem: Cardiac: Goal: Ability to achieve and maintain adequate cardiopulmonary perfusion will improve Outcome: Progressing   Problem: Clinical Measurements: Goal: Ability to maintain clinical measurements within normal limits will improve Outcome: Progressing   Problem: Clinical Measurements: Goal: Will remain free from infection Outcome: Progressing

## 2024-01-06 NOTE — Plan of Care (Signed)
 Palliative-   Received call from patient's daughter Matthew Edwards.  Matthew Edwards is patient's HCPOA. Document confirmed and scanned into patient's chart.  Meeting scheduled for 4pm today.   Cassondra Stain, AGNP-C Palliative Medicine  No charge

## 2024-01-06 NOTE — NC FL2 (Addendum)
 [place]  MEDICAID FL2 LEVEL OF CARE FORM     IDENTIFICATION  Patient Name: Matthew Edwards Birthdate: 08/05/1940 Sex: male Admission Date (Current Location): 01/03/2024  [facility] and [place] Number:  Producer, television/film/video and Address:  [facility]. [facility], [address], [place], [place] [address]      Provider Number: [id number]  Attending Physician Name and Address:  Christobal Guadalajara, MD  Relative Name and Phone Number:       Current Level of Care: Hospital Recommended Level of Care: Skilled Nursing Facility Prior Approval Number:    Date Approved/Denied:   PASRR Number: [id number]  Discharge Plan: SNF    Current Diagnoses: Patient Active Problem List   Diagnosis Date Noted   Pleural effusion 01/04/2024   Acute on chronic respiratory failure with hypoxia (HCC) 01/04/2024   Sepsis (HCC) 01/04/2024   Acute encephalopathy 01/04/2024   Type 2 diabetes mellitus with hyperglycemia, with long-term current use of insulin  (HCC) 01/04/2024   Dementia (HCC) 01/04/2024   Benign hypertension 01/04/2024   History of aortic valve replacement 01/04/2024   Aortic valve disease 01/04/2024   Permanent atrial fibrillation (HCC) 01/04/2024   Chronic heart failure with preserved ejection fraction (HFpEF) (HCC) 01/04/2024   Acute on chronic diastolic CHF (congestive heart failure) (HCC) 11/28/2023   Bilateral pleural effusion 11/28/2023   History of CAD (coronary artery disease) 11/28/2023   History of COPD 11/28/2023   Chronic hypoxic respiratory failure (HCC) 11/28/2023   Generalized anxiety disorder 11/28/2023   Peripheral neuropathy 11/28/2023   Hypokalemia 11/28/2023   Acute exacerbation of CHF (congestive heart failure) (HCC) 11/28/2023   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   Acute on chronic heart failure with preserved ejection fraction (HFpEF) (HCC) 10/14/2023   Abnormal echocardiogram 10/14/2023   History of coronary artery stent placement 10/14/2023    Dementia without behavioral disturbance (HCC) 10/14/2023   Non-insulin  dependent type 2 diabetes mellitus (HCC) 10/14/2023   Anticoagulated on Coumadin  10/14/2023   Chronic health problem 10/12/2023   Acute hypoxemic respiratory failure (HCC) 10/12/2023   Dysuria 08/13/2020   Lower extremity weakness 03/18/2020   Incontinence, feces 03/12/2020   Cerumen impaction 11/23/2019   Weight loss 11/21/2019   Adenomatous rectal polyp with high grade dysplasia s/p TEM resection 05/26/2016 05/26/2016   Spinal stenosis of lumbar region 11/28/2015   OSA (obstructive sleep apnea) 11/13/2014   Pulmonary HTN (HCC) 08/24/2014   Acute congestive heart failure (HCC) 08/02/2014   Cognitive impairment 07/02/2014   S/P AVR 12/29/2012   Paroxysmal atrial fibrillation (HCC)    Type 2 diabetes mellitus with diabetic neuropathy, unspecified (HCC) 11/25/2011   Vitamin D  deficiency 02/21/2010   Hypercalcemia 05/29/2009   Essential hypertension 02/23/2007   Coronary atherosclerosis 12/15/2006   GERD 12/15/2006   COPD (chronic obstructive pulmonary disease) (HCC) 12/10/2006   HLD (hyperlipidemia) 07/29/2006   Alcohol  abuse 07/29/2006   Aortic valve disorder 07/29/2006    Orientation RESPIRATION BLADDER Height & Weight     Self  O2 (2L O2 [place]) Continent Weight: 165 lb 2 oz (74.9 kg) Height:  5' 9 (175.3 cm)  BEHAVIORAL SYMPTOMS/MOOD NEUROLOGICAL BOWEL NUTRITION STATUS      Continent Diet (See discharge summary)  AMBULATORY STATUS COMMUNICATION OF NEEDS Skin   Extensive Assist Verbally Normal                       Personal Care Assistance Level of Assistance  Bathing, Feeding, Dressing Bathing Assistance:  (See discharge summary)  Feeding assistance:  (See discharge summary) Dressing Assistance:  (See discharge summary)     Functional Limitations Info  Sight, Speech, Hearing Sight Info: Impaired Financial trader) Hearing Info: Impaired Speech Info: Adequate    SPECIAL CARE FACTORS FREQUENCY  PT  (By licensed PT), OT (By licensed OT)     PT Frequency: 5x week OT Frequency: 5x week            Contractures Contractures Info: Not present    Additional Factors Info  Code Status, Allergies, Insulin  Sliding Scale, Psychotropic Code Status Info: Full Allergies Info: Pineapple Concentrate Psychotropic Info: gabapentin , seroquel  Insulin  Sliding Scale Info: See discharge summary       Current Medications (01/06/2024):  This is the current hospital active medication list Current Facility-Administered Medications  Medication Dose Route Frequency Provider Last Rate Last Admin   acetaminophen  (TYLENOL ) tablet 650 mg  650 mg Oral Q6H PRN Alfornia Madison, MD       Or   acetaminophen  (TYLENOL ) suppository 650 mg  650 mg Rectal Q6H PRN Alfornia Madison, MD       ceFEPIme  (MAXIPIME ) 2 g in sodium chloride  0.9 % 100 mL IVPB  2 g Intravenous Q12H Laron Agent, RPH 200 mL/hr at 01/06/24 0528 2 g at 01/06/24 9471   donepezil  (ARICEPT ) tablet 5 mg  5 mg Oral Daily Kc, Mennie, MD   5 mg at 01/06/24 9093   fluticasone  furoate-vilanterol (BREO ELLIPTA ) 200-25 MCG/ACT 1 puff  1 puff Inhalation Daily Kc, Mennie, MD   1 puff at 01/06/24 0906   furosemide  (LASIX ) tablet 20 mg  20 mg Oral Daily Parcells, Taylor A, PA-C   20 mg at 01/06/24 9093   gabapentin  (NEURONTIN ) capsule 100 mg  100 mg Oral QHS Kc, Mennie, MD   100 mg at 01/05/24 2212   insulin  aspart (novoLOG ) injection 0-6 Units  0-6 Units Subcutaneous TID WC Kc, Ramesh, MD       levalbuterol  (XOPENEX ) nebulizer solution 0.63 mg  0.63 mg Nebulization Q6H PRN Rathore, Vasundhra, MD   0.63 mg at 01/05/24 9475   metoprolol  tartrate (LOPRESSOR ) injection 5 mg  5 mg Intravenous Q6H PRN Alfornia Madison, MD       metroNIDAZOLE  (FLAGYL ) IVPB 500 mg  500 mg Intravenous Q12H Alfornia Madison, MD 100 mL/hr at 01/06/24 0910 500 mg at 01/06/24 0910   QUEtiapine  (SEROQUEL ) tablet 50 mg  50 mg Oral QHS Kc, Ramesh, MD   50 mg at 01/05/24 2220    spironolactone  (ALDACTONE ) tablet 12.5 mg  12.5 mg Oral Daily Kc, Ramesh, MD   12.5 mg at 01/06/24 9093   tiZANidine  (ZANAFLEX ) tablet 2 mg  2 mg Oral QHS Kc, Ramesh, MD   2 mg at 01/05/24 2212   vancomycin  (VANCOREADY) IVPB 1250 mg/250 mL  1,250 mg Intravenous Q24H Laron Agent, RPH 166.7 mL/hr at 01/06/24 0635 1,250 mg at 01/06/24 9364   warfarin (COUMADIN ) tablet 5 mg  5 mg Oral ONCE-1600 Zakrajsek, Katelyn M, [facility]       Warfarin - Pharmacist Dosing Inpatient   Does not apply q1600 Lola Maurilio PENNER, [facility]         Discharge Medications: Please see discharge summary for a list of discharge medications.  Relevant Imaging Results:  Relevant Lab Results:   Additional Information SSN [id number]  Luise JAYSON Pan, LCSWA

## 2024-01-06 NOTE — Progress Notes (Signed)
 PROGRESS NOTE TREA LATNER  FMW:[id number] DOB: 07-Apr-1941 DOA: 01/03/2024 PCP: [name], MD  Brief Narrative/Hospital Course: 83 year old male with chronic HFpEF, pulmonary hypertension, hyperlipidemia, hypertension, persistent A-fib, CAD status post CABG in 2012, diet controlled type 2 diabetes, COPD, chronic hypoxemic respiratory failure on 2 L home oxygen , dementia, OSA, aortic stenosis status post bioprosthetic AVR in 2012, cardiac fibroelastoma. Recently admitted 6/28-12/04/2023 for CHF exacerbation and released from SNF a wk ago present shortness of breath bilateral lower leg edema and altered mental status/confusion worse from baseline. Seen in the ED: Febrile up to 101.2, tachycardic tachypneic needing 4 L nasal cannula,Labs showing no leukocytosis, hemoglobin 9.9 (baseline 11-12), glucose 165, creatinine 1.1, albumin  2.8, troponin negative x 2, BNP 228, INR 2.6, blood cultures in process, ammonia level normal, lactic acid 2.7> 1.2, blood gas with pH 7.44 and PCO2 44, COVID/influenza/RSV PCR negative, UA not suggestive of infection. CXR>>vascular congestion and moderate to large right pleural effusion. Patient was given IV Lasix  40 mg, vancomycin , cefepime , metronidazole , and IV fluids and admitted for acute on chronic HFpEF recurrent moderate to large pleural effusion and sepsis with persistent A-fib with RVR.   Subjective: Patient seen and examined  He appears more alert awake today but he still has crackly sounds audible from distant Remains afebrile with On 2 L afebrile BP stable Labs stable this morning stable renal function  Assessment and plan:  Acute on chronic HFpEF/RV failure Recurrent moderate to large pleural effusion Acute on chronic hypoxic respiratory failure due to CHF and pleural effusion Possible sepsis POA: Last echo done on 6/30.  S/p IV Lasix , cont supplemental oxygen  monitor intake output Daily weight. US  guided thoracentesis ordered AND S/P 2L fluid  removal patient appears much improved after the procedure.  LDH 615 although bloody appearance neutrophil 64 with total protein 3.7 By lights criteria exudative as pleural fluid LDH/Serum LHD: 615/434 is 1.4 (is > 0.6), Pleural fluid protein/Serum protein: 3.7/6.2 is is 0.59 (as > 0.5). as such no pneumonia on xray, so far Blood cx ngtd, pleural fluid cx NGTD No evidence of UTI COVID respiratory virus panel and influenza negative Pro-Cal negative>Keep cefepime  for now, discontinue vancomycin  Cont po lasix ,Aldactone  Cardiology input appreciated.  Acute encephalopathy Dementia: In setting of sepsis CHF complex comorbidities.Delirium precautions fall precautions.CT head-no acute finding  Mentation overall alert awake oriented to self.  Continue Seroquel  at bedtime AND Cont gabapentin .  Dysphagia: Speech eval and appreciated now on dysphagia 1 diet.  Continue with aspiration precaution.  He is high risk for aspiration  Persistent A-fib with RVR Bradycardia on last admission following which metoprolol  was discontinued for unknown reason as per daughter: Patient  on Coumadin  but per family EMS was told no longer on Coumadin  but INR therapeutic. Continue Coumadin  to be dosed by pharmacy-is no evidence of bleeding and Hemoglobin stable. Daughter is in agreement four coumadin  use-pharmacy dosing. Recent Labs  Lab 01/03/24 2346 01/05/24 1153 01/06/24 0802  INR 2.6* 2.1* 1.8*    Chronic normocytic anemia: stable.Monitor.  CAD CABG Status post AVR in 2012 Mild MR: Last echo from June with EF 55 to 65% trace severe biatrial enlargement question papillary fibroelastoma dilated IVC, stable.  Not on ASA due to Coumadin  use.  Not on beta-blocker.  Troponin negative x 2.  T2DM: Well-controlled on sliding scale insulin .   Recent Labs  Lab 01/05/24 1531 01/05/24 2224 01/06/24 0638  GLUCAP 124* 122* 111*    COPD: Respiratory status stable continue bronchodilators.  Goals of  care  Deconditioning/debility: Patient with frequent hospitalization at this time is high risk for readmission and decompensation.  Palliative care has been consulted due to advanced age along with dementia and multiple complex co-morbidities Per daughter he has not walked for a while. She states  I am aware he is dying like my brother says but I do not want to talk about it right now  and she wants to look into 24 hr care.  DVT prophylaxis: SCDs Start: 01/04/24 9365 Code Status:   Code Status: Full Code Family Communication: plan of care discussed with patient at bedside. Daughter was updated 01/05/24. Patient status is: Remains hospitalized because of severity of illness. Level of care: Progressive   Dispo: The patient is from: home w/ daughter. She wants to look into 24 hr care.            Anticipated disposition: TBD Objective: Vitals last 24 hrs: Vitals:   01/06/24 0016 01/06/24 0141 01/06/24 0424 01/06/24 0730  BP: 110/60  100/72 104/69  Pulse: 68  76 (!) 109  Resp: 20  20 (!) 24  Temp: 97.9 F (36.6 C)  98.1 F (36.7 C) 98 F (36.7 C)  TempSrc: Oral  Oral Oral  SpO2: 95% 98% 100% 100%  Weight:   74.9 kg   Height:       Physical Examination: General exam: AAOX1 HEENT:Oral mucosa moist, Ear/Nose WNL grossly Respiratory system: Bilaterally crackles presentshe seems,no use of accessory muscle Cardiovascular system: S1 & S2 +, No JVD. Gastrointestinal system: Abdomen soft,NT,ND, BS+ Nervous System: Alert, awake, moving all extremities,and following commands. Extremities: LE edema neg,distal peripheral pulses palpable and warm.  Skin: No rashes,no icterus. MSK: Normal muscle bulk,tone, power   Medications reviewed:  Scheduled Meds:  donepezil   5 mg Oral Daily   fluticasone  furoate-vilanterol  1 puff Inhalation Daily   furosemide   20 mg Oral Daily   gabapentin   100 mg Oral QHS   insulin  aspart  0-6 Units Subcutaneous TID WC   QUEtiapine   50 mg Oral QHS   spironolactone    12.5 mg Oral Daily   tiZANidine   2 mg Oral QHS   Warfarin - Pharmacist Dosing Inpatient   Does not apply q1600   Continuous Infusions:  ceFEPime  (MAXIPIME ) IV 2 g (01/06/24 0528)   metronidazole  500 mg (01/06/24 0910)   vancomycin  1,250 mg (01/06/24 9364)   Diet: Diet Order             DIET - DYS 1 Room service appropriate? Yes; Fluid consistency: Thin  Diet effective now                    Data Reviewed: I have personally reviewed following labs and imaging studies ( see epic result tab) CBC: Recent Labs  Lab 01/03/24 2346 01/03/24 2358 01/04/24 1443 01/05/24 0230 01/06/24 0238  WBC 6.6  --  8.6 7.5 6.7  NEUTROABS 5.5  --   --   --   --   HGB 9.9* 11.2* 11.0* 10.6* 9.6*  HCT 33.4* 33.0* 37.4* 36.1* 32.3*  MCV 89.5  --  89.9 89.4 88.3  PLT 160  --  163 152 130*   CMP: Recent Labs  Lab 01/03/24 2346 01/03/24 2358 01/04/24 1443 01/05/24 0230 01/06/24 0238  NA 140 140 141 140 139  K 3.8 3.7 4.2 4.2 4.0  CL 101  --  99 100 99  CO2 29  --  33* 33* 34*  GLUCOSE 165*  --  97 122* 139*  BUN 15  --  13 13 14   CREATININE 1.10  --  0.91 0.97 0.77  CALCIUM  9.1  --  9.0 9.1 9.0   GFR: Estimated Creatinine Clearance: 73.6 mL/min (by C-G formula based on SCr of 0.77 mg/dL). Recent Labs  Lab 01/03/24 2346  AST 25  ALT 18  ALKPHOS 78  BILITOT 0.7  PROT 6.2*  ALBUMIN  2.8*   No results for input(s): LIPASE, AMYLASE in the last 168 hours.  Recent Labs  Lab 01/03/24 2346  AMMONIA 32   Coagulation Profile:  Recent Labs  Lab 01/03/24 2346 01/05/24 1153 01/06/24 0802  INR 2.6* 2.1* 1.8*   Unresulted Labs (From admission, onward)     Start     Ordered   01/28/2024 0500  Protime-INR  Daily,   R      01/06/24 0723   01/05/24 0500  Basic metabolic panel with GFR  Daily,   R      01/04/24 0729   01/05/24 0500  CBC  Daily,   R      01/04/24 0729   01/04/24 1007  Miscellaneous [facility] test (send-out)  RELEASE UPON ORDERING,   STAT        01/04/24 1007    01/04/24 0639  Urine Culture (for pregnant, neutropenic or urologic patients or patients with an indwelling urinary catheter)  (Urine Labs)  Add-on,   AD       Question:  Indication  Answer:  Sepsis   01/04/24 0638           Antimicrobials/Microbiology: Anti-infectives (From admission, onward)    Start     Dose/Rate Route Frequency Ordered Stop   01/05/24 0500  vancomycin  (VANCOREADY) IVPB 1250 mg/250 mL        1,250 mg 166.7 mL/hr over 90 Minutes Intravenous Every 24 hours 01/04/24 0659     01/04/24 1300  ceFEPIme  (MAXIPIME ) 2 g in sodium chloride  0.9 % 100 mL IVPB        2 g 200 mL/hr over 30 Minutes Intravenous Every 12 hours 01/04/24 0659     01/04/24 1000  metroNIDAZOLE  (FLAGYL ) IVPB 500 mg        500 mg 100 mL/hr over 60 Minutes Intravenous Every 12 hours 01/04/24 0638     01/03/24 2345  vancomycin  (VANCOREADY) IVPB 1750 mg/350 mL        1,750 mg 175 mL/hr over 120 Minutes Intravenous  Once 01/03/24 2337 01/04/24 0547   01/03/24 2330  ceFEPIme  (MAXIPIME ) 2 g in sodium chloride  0.9 % 100 mL IVPB        2 g 200 mL/hr over 30 Minutes Intravenous  Once 01/03/24 2322 01/04/24 0142   01/03/24 2330  metroNIDAZOLE  (FLAGYL ) IVPB 500 mg        500 mg 100 mL/hr over 60 Minutes Intravenous  Once 01/03/24 2322 01/04/24 0225   01/03/24 2330  vancomycin  (VANCOCIN ) IVPB 1000 mg/200 mL premix  Status:  Discontinued        1,000 mg 200 mL/hr over 60 Minutes Intravenous  Once 01/03/24 2322 01/03/24 2337         Component Value Date/Time   SDES PLEURAL 01/04/2024 1007   SPECREQUEST RIGHT LUNG 01/04/2024 1007   CULT  01/04/2024 1007    NO GROWTH < 24 HOURS Performed at [facility], [address] [place], [place] [address]    REPTSTATUS PENDING 01/04/2024 1007     Mennie LAMY, MD [facility] 01/06/2024, 9:58 AM

## 2024-01-06 NOTE — Plan of Care (Signed)
   Problem: Clinical Measurements: Goal: Will remain free from infection Outcome: Progressing   Problem: Clinical Measurements: Goal: Respiratory complications will improve Outcome: Progressing

## 2024-01-07 ENCOUNTER — Inpatient Hospital Stay (HOSPITAL_COMMUNITY)

## 2024-01-07 DIAGNOSIS — J90 Pleural effusion, not elsewhere classified: Secondary | ICD-10-CM | POA: Diagnosis not present

## 2024-01-07 DIAGNOSIS — F03B0 Unspecified dementia, moderate, without behavioral disturbance, psychotic disturbance, mood disturbance, and anxiety: Secondary | ICD-10-CM | POA: Diagnosis not present

## 2024-01-07 DIAGNOSIS — Z7189 Other specified counseling: Secondary | ICD-10-CM | POA: Diagnosis not present

## 2024-01-07 DIAGNOSIS — I5033 Acute on chronic diastolic (congestive) heart failure: Secondary | ICD-10-CM | POA: Diagnosis not present

## 2024-01-07 LAB — BASIC METABOLIC PANEL WITH GFR
Anion gap: 7 (ref 5–15)
BUN: 20 mg/dL (ref 8–23)
CO2: 33 mmol/L — ABNORMAL HIGH (ref 22–32)
Calcium: 9.3 mg/dL (ref 8.9–10.3)
Chloride: 100 mmol/L (ref 98–111)
Creatinine, Ser: 0.85 mg/dL (ref 0.61–1.24)
GFR, Estimated: >60 mL/min (ref >60)
Glucose, Bld: 147 mg/dL — ABNORMAL HIGH (ref 70–99)
Potassium: 4.5 mmol/L (ref 3.5–5.1)
Sodium: 140 mmol/L (ref 135–145)

## 2024-01-07 LAB — PROTIME-INR
INR: 1.6 — ABNORMAL HIGH (ref 0.8–1.2)
Prothrombin Time: 19.4 s — ABNORMAL HIGH (ref 11.4–15.2)

## 2024-01-07 LAB — CBC
HCT: 33.4 % — ABNORMAL LOW (ref 39.0–52.0)
Hemoglobin: 9.7 g/dL — ABNORMAL LOW (ref 13.0–17.0)
MCH: 26.4 pg (ref 26.0–34.0)
MCHC: 29 g/dL — ABNORMAL LOW (ref 30.0–36.0)
MCV: 91 fL (ref 80.0–100.0)
Platelets: 146 K/uL — ABNORMAL LOW (ref 150–400)
RBC: 3.67 MIL/uL — ABNORMAL LOW (ref 4.22–5.81)
RDW: 16.5 % — ABNORMAL HIGH (ref 11.5–15.5)
WBC: 7.4 K/uL (ref 4.0–10.5)
nRBC: 0 % (ref 0.0–0.2)

## 2024-01-07 LAB — GLUCOSE, CAPILLARY: Glucose-Capillary: 113 mg/dL — ABNORMAL HIGH (ref 70–99)

## 2024-01-07 LAB — BODY FLUID CULTURE W GRAM STAIN: Culture: NO GROWTH

## 2024-01-07 MED ORDER — ONDANSETRON 4 MG PO TBDP
4.0000 mg | ORAL_TABLET | Freq: Four times a day (QID) | ORAL | Status: DC | PRN
Start: 2024-01-07 — End: 2024-01-08

## 2024-01-07 MED ORDER — POLYVINYL ALCOHOL 1.4 % OP SOLN: 1.0000 [drp] | Freq: Four times a day (QID) | OPHTHALMIC | Status: DC | PRN

## 2024-01-07 MED ORDER — MIDAZOLAM HCL 2 MG/2ML IJ SOLN: 2.0000 mg | INTRAMUSCULAR | Status: DC | PRN

## 2024-01-07 MED ORDER — HALOPERIDOL LACTATE 2 MG/ML PO CONC: 0.5000 mg | ORAL | Status: DC | PRN

## 2024-01-07 MED ORDER — GLYCOPYRROLATE 0.2 MG/ML IJ SOLN: 0.2000 mg | INTRAMUSCULAR | Status: DC | PRN

## 2024-01-07 MED ORDER — HALOPERIDOL LACTATE 5 MG/ML IJ SOLN: 0.5000 mg | INTRAMUSCULAR | Status: DC | PRN

## 2024-01-07 MED ORDER — ONDANSETRON HCL 4 MG/2ML IJ SOLN: 4.0000 mg | Freq: Four times a day (QID) | INTRAMUSCULAR | Status: DC | PRN

## 2024-01-07 MED ORDER — LORAZEPAM 1 MG PO TABS: 1.0000 mg | ORAL_TABLET | ORAL | Status: DC | PRN

## 2024-01-07 MED ORDER — HALOPERIDOL 0.5 MG PO TABS: 0.5000 mg | ORAL_TABLET | ORAL | Status: DC | PRN

## 2024-01-07 MED ORDER — MORPHINE 100MG IN NS 100ML (1MG/ML) PREMIX INFUSION
2.0000 mg/h | INTRAVENOUS | Status: DC
  Administered 2024-01-07: 2 mg/h via INTRAVENOUS
  Filled 2024-01-07: qty 100

## 2024-01-07 MED ORDER — FUROSEMIDE 10 MG/ML IJ SOLN
40.0000 mg | Freq: Once | INTRAMUSCULAR | Status: AC
  Administered 2024-01-07: 40 mg via INTRAVENOUS
  Filled 2024-01-07: qty 4

## 2024-01-07 MED ORDER — LORAZEPAM 2 MG/ML PO CONC: 1.0000 mg | ORAL | Status: DC | PRN

## 2024-01-07 MED ORDER — MORPHINE BOLUS VIA INFUSION
2.0000 mg | INTRAVENOUS | Status: DC | PRN
  Administered 2024-01-07: 2 mg via INTRAVENOUS

## 2024-01-07 MED ORDER — GLYCOPYRROLATE 1 MG PO TABS
1.0000 mg | ORAL_TABLET | ORAL | Status: DC | PRN
Start: 2024-01-07 — End: 2024-01-08

## 2024-01-09 LAB — CULTURE, BLOOD (ROUTINE X 2)
Culture: NO GROWTH
Culture: NO GROWTH
Special Requests: ADEQUATE

## 2024-01-11 LAB — MISC LABCORP TEST (SEND OUT): Labcorp test code: 9985

## 2024-01-31 NOTE — Progress Notes (Signed)
 Daily Progress Note   Patient Name: Matthew Edwards       Date: 01/21/2024 DOB: [birth year]  Age: 83 y.o. MRN#: [id number] Attending Physician: Christobal Guadalaj[place], MD Primary Care Physician: [name], MD Admit Date: 01/03/2024  Reason for Consultation/Follow-up: Establishing goals of care  Patient Profile/HPI:  83 y.o. male  with past medical history of CHF, dementia, pulm HTN, HLD, a-fib, CAD, s/p CABG 2012,  DM2, COPD on 2L oxygen  baseline, aortic stenosis,  presented to ED on 01/03/2024 with feet swelling, SOB, confusion. Admitted and being treated for heart failure exacerbation with pleural effusion, encephalopathy, possible sepsis- no clear infections source, afib with RVR on coumadin . Palliative consulted for GOC.   Subjective: Chart reviewed including labs, progress notes, imaging from this and previous encounters.  Discussed with attending Dr. Christobal.  Patient more lethargic this morning, RR greatly elevated. Audible secretions.  Chest xray reviewed - appears to be R side recurrence of pleural effusion.  Spoke with HCPOA- [name] and her sister 58.  They are in agreement with changing code status to DNR and transitioning patient to comfort measures only and allowing natural dying process. Discussed transition to comfort measures only which includes stopping IV fluids, antibiotics, labs and providing symptom management for SOB, anxiety, nausea, vomiting, and other symptoms of dying.   Review of Systems  Unable to perform ROS: Mental status change     Physical Exam Vitals and nursing note reviewed.  Constitutional:      General: He is in acute distress.     Appearance: He is ill-appearing.  Cardiovascular:     Rate and Rhythm: Normal rate.  Pulmonary:     Comments: Audible secretions,  RR elevated with increased work of breathing Neurological:     Comments: lethargic             Vital Signs: BP 118/74 (BP Location: Left Arm)   Pulse (!) 125   Temp 98 F (36.7 C) (Oral)   Resp (!) 44  Ht 5' 9 (1.753 m)   Wt 78.4 kg   SpO2 94%   BMI 25.52 kg/m  SpO2: SpO2: 94 % O2 Device: O2 Device: Nasal Cannula O2 Flow Rate: O2 Flow Rate (L/min): 2 L/min  Intake/output summary:  Intake/Output Summary (Last 24 hours) at 01/22/2024 1006 Last data filed at  01/26/2024 0045 Gross per 24 hour  Intake 520 ml  Output 1100 ml  Net -580 ml   LBM: Last BM Date :  (Unknown pt has memory Inpairment) Baseline Weight: Weight: 78.2 kg Most recent weight: Weight: 78.4 kg       Palliative Assessment/Data: PPS: 10%      Patient Active Problem List   Diagnosis Date Noted   Pleural effusion 01/04/2024   Acute on chronic respiratory failure with hypoxia (HCC) 01/04/2024   Sepsis (HCC) 01/04/2024   Acute encephalopathy 01/04/2024   Type 2 diabetes mellitus with hyperglycemia, with long-term current use of insulin  (HCC) 01/04/2024   Dementia (HCC) 01/04/2024   Benign hypertension 01/04/2024   History of aortic valve replacement 01/04/2024   Aortic valve disease 01/04/2024   Permanent atrial fibrillation (HCC) 01/04/2024   Chronic heart failure with preserved ejection fraction (HFpEF) (HCC) 01/04/2024   Acute on chronic diastolic CHF (congestive heart failure) (HCC) 11/28/2023   Bilateral pleural effusion 11/28/2023   History of CAD (coronary artery disease) 11/28/2023   History of COPD 11/28/2023   Chronic hypoxic respiratory failure (HCC) 11/28/2023   Generalized anxiety disorder 11/28/2023   Peripheral neuropathy 11/28/2023   Hypokalemia 11/28/2023   Acute exacerbation of CHF (congestive heart failure) (HCC) 11/28/2023   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   Acute on chronic heart failure with preserved ejection fraction (HFpEF) (HCC) 10/14/2023   Abnormal  echocardiogram 10/14/2023   History of coronary artery stent placement 10/14/2023   Dementia without behavioral disturbance (HCC) 10/14/2023   Non-insulin  dependent type 2 diabetes mellitus (HCC) 10/14/2023   Anticoagulated on Coumadin  10/14/2023   Chronic health problem 10/12/2023   Acute hypoxemic respiratory failure (HCC) 10/12/2023   Dysuria 08/13/2020   Lower extremity weakness 03/18/2020   Incontinence, feces 03/12/2020   Cerumen impaction 11/23/2019   Weight loss 11/21/2019   Adenomatous rectal polyp with high grade dysplasia s/p TEM resection 05/26/2016 05/26/2016   Spinal stenosis of lumbar region 11/28/2015   OSA (obstructive sleep apnea) 11/13/2014   Pulmonary HTN (HCC) 08/24/2014   Acute congestive heart failure (HCC) 08/02/2014   Cognitive impairment 07/02/2014   S/P AVR 12/29/2012   Paroxysmal atrial fibrillation (HCC)    Type 2 diabetes mellitus with diabetic neuropathy, unspecified (HCC) 11/25/2011   Vitamin D  deficiency 02/21/2010   Hypercalcemia 05/29/2009   Essential hypertension 02/23/2007   Coronary atherosclerosis 12/15/2006   GERD 12/15/2006   COPD (chronic obstructive pulmonary disease) (HCC) 12/10/2006   HLD (hyperlipidemia) 07/29/2006   Alcohol  abuse 07/29/2006   Aortic valve disorder 07/29/2006    Palliative Care Assessment & Plan    Assessment/Recommendations/Plan  Advanced heart failure, dementia, recurrent pleural effusion- DNR-comfort, transition to full comfort measures only Morphine  infusion 2-10mg /hr- titrate as ordered with bolus dosing q15 min Other comfort medications as ordered (versed , robinul )  Code Status:   Code Status: Do not attempt resuscitation (DNR) - Comfort care   Prognosis:  Hours - Days- Discharge Planning: Anticipated Hospital Death  Care plan was discussed with patient's family and care team.   Thank you for allowing the Palliative Medicine Team to assist in the care of this patient.   Preparing to see the  patient (e.g., review of tests) Obtaining and/or reviewing separately obtained history Performing a medically necessary appropriate examination and/or evaluation Counseling and educating the patient/family/caregiver Ordering medications, tests, or procedures Referring and communicating with other health care professionals (when not reported separately) Documenting clinical information in the electronic or other health record Independently interpreting results (  not reported separately) and communicating results to the patient/family/caregiver Care coordination (not reported separately) Clinical documentation  Cassondra Stain, AGNP-C Palliative Medicine   Please contact Palliative Medicine Team phone at [phone] for questions and concerns.

## 2024-01-31 NOTE — TOC Progression Note (Signed)
 Transition of Care [facility]) - Progression Note    Patient Details  Name: Matthew Edwards MRN: [id number] Date of Birth: [birth year]  [facility]) CM/SW Contact  Luise JAYSON Pan, [place] Phone Number: 01/21/2024, 12:20 PM  Clinical Narrative:   Patient is now DNR Comfort. Per Palliative note, anticipated hospital death at this time.   CSW will continue to follow.    Expected Discharge Plan:  [facility] Death) Barriers to Discharge: Other (must enter comment) [facility] Death)               Expected Discharge Plan and Services In-house Referral: Clinical Social Work, Hospice / Palliative Care     Living arrangements for the past 2 months: Apartment                                       Social Drivers of Health (SDOH) Interventions SDOH Screenings   Food Insecurity: Patient Unable To Answer (01/05/2024)  Housing: Unknown (01/05/2024)  Transportation Needs: Patient Unable To Answer (01/05/2024)  Utilities: Patient Unable To Answer (01/05/2024)  Depression (PHQ2-9): Medium Risk (11/19/2020)  Financial Resource Strain: Low Risk  (12/09/2022)   Received from [facility]  Social Connections: Patient Unable To Answer (01/05/2024)  Stress: No Stress Concern Present (03/22/2022)   Received from [facility]  Tobacco Use: Medium Risk (01/04/2024)    Readmission Risk Interventions    11/30/2023    1:16 PM  Readmission Risk Prevention Plan  Transportation Screening Complete  PCP or Specialist Appt within 3-5 Days Complete  HRI or Home Care Consult Complete  Palliative Care Screening Not Applicable  Medication Review (RN Care Manager) Complete

## 2024-01-31 NOTE — Plan of Care (Signed)
  Problem: Education: Goal: Ability to demonstrate management of disease process will improve Outcome: Progressing   Problem: Education: Goal: Ability to verbalize understanding of medication therapies will improve Outcome: Progressing   Problem: Activity: Goal: Capacity to carry out activities will improve Outcome: Progressing   Problem: Cardiac: Goal: Ability to achieve and maintain adequate cardiopulmonary perfusion will improve Outcome: Progressing   Problem: Clinical Measurements: Goal: Will remain free from infection Outcome: Progressing   Problem: Clinical Measurements: Goal: Diagnostic test results will improve Outcome: Progressing   Problem: Clinical Measurements: Goal: Respiratory complications will improve Outcome: Progressing

## 2024-01-31 NOTE — Progress Notes (Signed)
 PROGRESS NOTE Matthew Edwards  FMW:[id number] DOB: 03-28-1941 DOA: 01/03/2024 PCP: [name], MD  Brief Narrative/Hospital Course: 83 year old male with chronic HFpEF, pulmonary hypertension, hyperlipidemia, hypertension, persistent A-fib, CAD status post CABG in 2012, diet controlled type 2 diabetes, COPD, chronic hypoxemic respiratory failure on 2 L home oxygen , dementia, OSA, aortic stenosis status post bioprosthetic AVR in 2012, cardiac fibroelastoma. Recently admitted 6/28-12/04/2023 for CHF exacerbation and released from SNF a wk ago present shortness of breath bilateral lower leg edema and altered mental status/confusion worse from baseline. Seen in the ED: Febrile up to 101.2, tachycardic tachypneic needing 4 L nasal cannula,Labs showing no leukocytosis, hemoglobin 9.9 (baseline 11-12), glucose 165, creatinine 1.1, albumin  2.8, troponin negative x 2, BNP 228, INR 2.6, blood cultures in process, ammonia level normal, lactic acid 2.7> 1.2, blood gas with pH 7.44 and PCO2 44, COVID/influenza/RSV PCR negative, UA not suggestive of infection. CXR>>vascular congestion and moderate to large right pleural effusion. Patient was given IV Lasix  40 mg, vancomycin , cefepime , metronidazole , and IV fluids and admitted for acute on chronic HFpEF recurrent moderate to large pleural effusion and sepsis with persistent A-fib with RVR. Seen by cardiology subsequently transition to oral diuretics Palliative care was consulted unfortunately remains full code he is high risk for readmissions and decompensation family is aware about poor prognosis and wants to continue full code for now, they would like to look into SNF and long-term placement   Subjective: Patient seen and examined  Having labored breathing today and lethargic Audible crackles outside and w/ tachypnea and in distress Overnight afebrile I na fib Labs overall stable, INR subtherapeutic at 1.6  Assessment and plan:  Acute on chronic HFpEF/RV  failure Recurrent moderate to large pleural effusion-exudative Acute on chronic hypoxic respiratory failure due to CHF and pleural effusion Possible sepsis POA Acute respiratory distress 8/8: Last echo done on 6/30, treated w/ IV Lasix  [place], and s/p thoracentesis 2L fluid removed LDH 615 although bloody appearance neutrophil 64 with total protein 3.7By lights criteria exudative as pleural fluid LDH/Serum LHD: 615/434 is 1.4 (is > 0.6), Pleural fluid protein/Serum protein: 3.7/6.2 is is 0.59 (as > 0.5). as such no pneumonia on xray, so far Blood cx ngtd, pleural fluid cx NGTD. No evidence of UTI COVID respiratory virus panel and influenza negative Pro-Cal negative. He remains at high risk of aspiration w/ his baseline dementia On antibiotics since 8/5> changed to unasyn. IR restart Lasix  40 mg x 1 along with chest x-ray ABG. Notified palliative care and also made a call to patient's daughter> informs me that they have decided for DNR He remains at high risk for decompensation.  Acute metabolic encephalopathy Dementia: In setting of sepsis CHF complex comorbidities along with dementia and combined delirium CT head-no acute finding.  Appears more lethargic this morning  Dysphagia At risk of aspiration: Speech eval and appreciated now on dysphagia 1 diet-continue aspiration precaution feeding with assistance but will keep n.p.o. due to his labored breathing   Persistent A-fib with RVR Bradycardia on last admission following which metoprolol  was discontinued for unknown reason as per daughter.Patient  on Coumadin  but per family EMS was told no longer on Coumadin  but INR therapeutic. Continue Coumadin  to be dosed by pharmacy-is no evidence of bleeding and Hemoglobin stable. Daughter is in agreement four coumadin  use-pharmacy dosing. Recent Labs  Lab 01/03/24 2346 01/05/24 1153 01/06/24 0802 01/05/2024 0250  INR 2.6* 2.1* 1.8* 1.6*    Chronic normocytic anemia: stable. Monitor.  CAD  CABG Status  post AVR in 2012 Mild MR: Last echo from June with EF 55 to 65% trace severe biatrial enlargement question papillary fibroelastoma dilated IVC, stable.  Not on ASA due to Coumadin  use.  Not on beta-blocker.  Troponin negative x 2.  T2DM: Well-controlled on sliding scale insulin .   Recent Labs  Lab 01/06/24 0638 01/06/24 1126 01/06/24 1524 01/06/24 1925 01/06/2024 0603  GLUCAP 111* 127* 134* 116* 113*    COPD: Respiratory status stable continue bronchodilators.  Goals of care Deconditioning/debility: Patient with frequent hospitalization at this time is high risk for readmission and decompensation.  Palliative care has been consulted due to advanced age along with dementia and multiple complex co-morbidities Per daughter he has not walked for a while, and has been progressively going downhill. She stated  I am aware he is dying like my brother says but I do not want to talk about it right now  Palliative had met with patient as per the daughters. This morning given patient's respiratory distress I again called and spoke w/ Matthew Edwards this am and updated I have notified palliative care were in the process to call patient's both the daughters  Mobility: PT Orders: Active PT Follow up Rec: Skilled Nursing-Short Term Rehab (<3 Hours/Day)01/06/2024 [phone]    DVT prophylaxis: SCDs Start: 01/04/24 9365 Code Status:   Code Status: Full Code Family Communication: plan of care discussed with patient at bedside. Daughter was updated 01/05/24. Patient status is: Remains hospitalized because of severity of illness. Level of care: Progressive.   Dispo:The patient is from:home w/ daughter.She wants to look into 24 hr care.            Anticipated disposition: To be decided  Objective: Vitals last 24 hrs: Vitals:   01/06/24 2355 01/18/2024 0354 01/10/2024 0409 01/06/2024 0756  BP: 114/73 102/70  118/74  Pulse: 72 78  (!) 104  Resp: (!) 41 (!) 25  (!) 32  Temp: 98 F (36.7 C) 98 F (36.7  C)    TempSrc: Oral Oral  Oral  SpO2: 98% 96%  94%  Weight:   78.4 kg   Height:       Physical Examination: General exam: lethargic HEENT:Oral mucosa moist, Ear/Nose WNL grossly Respiratory system: Bilaterally conducted breath sounds  and crackles, using accessory muscles and abdominal breathing. Cardiovascular system: S1 & S2 +, No JVD. Gastrointestinal system: Abdomen soft,NT,ND, BS+ Nervous System: lethargic Extremities: LE edema neg,distal peripheral pulses palpable and warm.  Skin: No rashes,no icterus. MSK: thin muscle bulk,tone, power.   Medications reviewed:  Scheduled Meds:  donepezil   5 mg Oral Daily   fluticasone  furoate-vilanterol  1 puff Inhalation Daily   furosemide   20 mg Oral Daily   gabapentin   100 mg Oral QHS   insulin  aspart  0-6 Units Subcutaneous TID WC   QUEtiapine   50 mg Oral QHS   spironolactone   12.5 mg Oral Daily   tiZANidine   2 mg Oral QHS   Warfarin - Pharmacist Dosing Inpatient   Does not apply q1600  Continuous Infusions:  Diet: Diet Order             DIET - DYS 1 Room service appropriate? Yes; Fluid consistency: Thin  Diet effective now                    Data Reviewed: I have personally reviewed following labs and imaging studies ( see epic result tab) CBC: Recent Labs  Lab 01/03/24 2346 01/03/24 2358 01/04/24 1443 01/05/24 0230 01/06/24 9761  01/15/2024 0250  WBC 6.6  --  8.6 7.5 6.7 7.4  NEUTROABS 5.5  --   --   --   --   --   HGB 9.9* 11.2* 11.0* 10.6* 9.6* 9.7*  HCT 33.4* 33.0* 37.4* 36.1* 32.3* 33.4*  MCV 89.5  --  89.9 89.4 88.3 91.0  PLT 160  --  163 152 130* 146*   CMP: Recent Labs  Lab 01/03/24 2346 01/03/24 2358 01/04/24 1443 01/05/24 0230 01/06/24 0238 01/06/2024 0250  NA 140 140 141 140 139 140  K 3.8 3.7 4.2 4.2 4.0 4.5  CL 101  --  99 100 99 100  CO2 29  --  33* 33* 34* 33*  GLUCOSE 165*  --  97 122* 139* 147*  BUN 15  --  13 13 14 20   CREATININE 1.10  --  0.91 0.97 0.77 0.85  CALCIUM  9.1  --  9.0 9.1  9.0 9.3   GFR: Estimated Creatinine Clearance: 69.3 mL/min (by C-G formula based on SCr of 0.85 mg/dL). Recent Labs  Lab 01/03/24 2346  AST 25  ALT 18  ALKPHOS 78  BILITOT 0.7  PROT 6.2*  ALBUMIN  2.8*   No results for input(s): LIPASE, AMYLASE in the last 168 hours.  Recent Labs  Lab 01/03/24 2346  AMMONIA 32   Coagulation Profile:  Recent Labs  Lab 01/03/24 2346 01/05/24 1153 01/06/24 0802 01/06/2024 0250  INR 2.6* 2.1* 1.8* 1.6*   Unresulted Labs (From admission, onward)     Start     Ordered   01/04/24 1007  Miscellaneous [facility] test (send-out)  RELEASE UPON ORDERING,   STAT        01/04/24 1007           Antimicrobials/Microbiology: Anti-infectives (From admission, onward)    Start     Dose/Rate Route Frequency Ordered Stop   01/06/24 1200  ceFEPIme  (MAXIPIME ) 2 g in sodium chloride  0.9 % 100 mL IVPB        2 g 200 mL/hr over 30 Minutes Intravenous Every 8 hours 01/06/24 1121 01/06/24 2031   01/05/24 0500  vancomycin  (VANCOREADY) IVPB 1250 mg/250 mL  Status:  Discontinued        1,250 mg 166.7 mL/hr over 90 Minutes Intravenous Every 24 hours 01/04/24 0659 01/06/24 1115   01/04/24 1300  ceFEPIme  (MAXIPIME ) 2 g in sodium chloride  0.9 % 100 mL IVPB  Status:  Discontinued        2 g 200 mL/hr over 30 Minutes Intravenous Every 12 hours 01/04/24 0659 01/06/24 1121   01/04/24 1000  metroNIDAZOLE  (FLAGYL ) IVPB 500 mg  Status:  Discontinued        500 mg 100 mL/hr over 60 Minutes Intravenous Every 12 hours 01/04/24 0638 01/06/24 1115   01/03/24 2345  vancomycin  (VANCOREADY) IVPB 1750 mg/350 mL        1,750 mg 175 mL/hr over 120 Minutes Intravenous  Once 01/03/24 2337 01/04/24 0547   01/03/24 2330  ceFEPIme  (MAXIPIME ) 2 g in sodium chloride  0.9 % 100 mL IVPB        2 g 200 mL/hr over 30 Minutes Intravenous  Once 01/03/24 2322 01/04/24 0142   01/03/24 2330  metroNIDAZOLE  (FLAGYL ) IVPB 500 mg        500 mg 100 mL/hr over 60 Minutes Intravenous  Once 01/03/24  2322 01/04/24 0225   01/03/24 2330  vancomycin  (VANCOCIN ) IVPB 1000 mg/200 mL premix  Status:  Discontinued        1,000  mg 200 mL/hr over 60 Minutes Intravenous  Once 01/03/24 2322 01/03/24 2337         Component Value Date/Time   SDES PLEURAL 01/04/2024 1007   SPECREQUEST RIGHT LUNG 01/04/2024 1007   CULT  01/04/2024 1007    NO GROWTH 3 DAYS Performed at [facility], [address] [place], [place] [address]    REPTSTATUS 01/15/2024 FINAL 01/04/2024 1007    CRITICAL CARE Performed by: Mennie LAMY   Total critical care time: 45 minutes  Critical care time was exclusive of separately billable procedures and treating other patients.  Critical care was necessary to treat or prevent imminent or life-threatening deterioration.  Critical care was time spent personally by me on the following activities: development of treatment plan with patient and/or surrogate as well as nursing, discussions with consultants, evaluation of patient's response to treatment, examination of patient, obtaining history from patient or surrogate, ordering and performing treatments and interventions, ordering and review of laboratory studies, ordering and review of radiographic studies, pulse oximetry and re-evaluation of patient's condition.   Mennie LAMY, MD [facility] 01/26/2024, 10:14 AM

## 2024-01-31 NOTE — Death Summary Note (Signed)
 DEATH SUMMARY   Patient Details  Name: Matthew Edwards MRN: [id number] DOB: 1940/09/16 ERE:Dyjy, Leni Edyth LABOR, MD Admission/Discharge Information   Admit Date:  01/30/2024  Date of Death: Date of Death: 03-Feb-2024  Time of Death: Time of Death: 1835  Length of Stay: 3   Principle Cause of death: Acute respiratory failure/acute on chronic CHF and recurrent pleural effusion in the setting of failure to thrive and dementia  Hospital Diagnoses: Principal Problem:   Acute on chronic heart failure with preserved ejection fraction (HFpEF) (HCC) Active Problems:   Pleural effusion   Acute on chronic respiratory failure with hypoxia (HCC)   Sepsis (HCC)   Acute encephalopathy   Type 2 diabetes mellitus with hyperglycemia, with long-term current use of insulin  (HCC)   Dementia (HCC)   Benign hypertension   History of aortic valve replacement   Aortic valve disease   Permanent atrial fibrillation (HCC)   Chronic heart failure with preserved ejection fraction (HFpEF) [facility])   Hospital Course: 83 year old male with chronic HFpEF, pulmonary hypertension, hyperlipidemia, hypertension, persistent A-fib, CAD status post CABG in 2012, diet controlled type 2 diabetes, COPD, chronic hypoxemic respiratory failure on 2 L home oxygen , dementia, OSA, aortic stenosis status post bioprosthetic AVR in 2012, cardiac fibroelastoma. Recently admitted 6/28-12/04/2023 for CHF exacerbation and released from SNF a wk ago present shortness of breath bilateral lower leg edema and altered mental status/confusion worse from baseline. Seen in the ED: Febrile up to 101.2, tachycardic tachypneic needing 4 L nasal cannula,Labs showing no leukocytosis, hemoglobin 9.9 (baseline 11-12), glucose 165, creatinine 1.1, albumin  2.8, troponin negative x 2, BNP 228, INR 2.6, blood cultures in process, ammonia level normal, lactic acid 2.7> 1.2, blood gas with pH 7.44 and PCO2 44, COVID/influenza/RSV PCR negative, UA not suggestive of  infection. CXR>>vascular congestion and moderate to large right pleural effusion. Patient was given IV Lasix  40 mg, vancomycin , cefepime , metronidazole , and IV fluids and admitted for acute on chronic HFpEF recurrent moderate to large pleural effusion and sepsis with persistent A-fib with RVR. Seen by cardiology subsequently transition to oral diuretics Palliative care was consulted unfortunately remains full code he is high risk for readmissions and decompensation family is aware about poor prognosis and wants to continue full code for now, they would like to look into SNF and long-term placement  Last echo done on 6/30, treated w/ IV Lasix  [place], and s/p thoracentesis 2L fluid removed> LDH 615 although bloody appearance neutrophil 64 with total protein 3.7By lights criteria exudative as pleural fluid LDH/Serum LHD: 615/434 is 1.4 (is > 0.6), Pleural fluid protein/Serum protein: 3.7/6.2 is is 0.59 (as > 0.5). as such no pneumonia on xray, so far Blood cx ngtd, pleural fluid cx NGTD. No evidence of UTI COVID respiratory virus panel and influenza negative Pro-Cal negative. He remains at high risk of aspiration w/ his baseline dementia On antibiotics since 01/04/24 ON 2024/02/03 AM Patient had rapid decline with respiratory distress, early morning reading, with audible crackles from distant, in the setting of acute on chronic CHF and recurrent pleural effusion, and remain at high risk for aspiration. Given his significant dementia, also with encephalopathy and failure to thrive palliative care had further discussed and family opted to transition to comfort measures and patient peacefully passed away Feb 03, 2024  Discharge diagnosis :  Acute on chronic HFpEF/RV failure Recurrent moderate to large pleural effusion-exudative Acute on chronic hypoxic respiratory failure due to CHF and pleural effusion Possible sepsis POA Acute respiratory distress February 03, 2024 Acute metabolic encephalopathy  Dementia Adult failure to  thrive Dysphagia-At high isk of aspiration Persistent A-fib with RVR Chronic normocytic anemia: CAD CABG Status post AVR in 2012 Mild MR: T2DM: COPD: Goals of care Deconditioning/debility: Follow-up After extensive discussion family opted for palliative approach with comfort measures and patient peacefully passed away January 27, 2024   Consultations Cardiology Palliative care  The results of significant diagnostics from this hospitalization (including imaging, microbiology, ancillary and laboratory) are listed below for reference.   Significant Diagnostic Studies: DG Chest Port 1 View Result Date: January 27, 2024 CLINICAL DATA:  10026 Shortness of breath 10026. EXAM: PORTABLE CHEST 1 VIEW COMPARISON:  01/04/2024. FINDINGS: There is large right pleural effusion, increased since the prior study. There are diffuse ground-glass changes in the right lung which may be due to layering pleural effusion. However, there are probable superimposed atelectatic changes as well, which may be due to mucous plugging. Left lung and left lateral costophrenic angle are grossly clear. No pneumothorax on either side. Evaluation of cardiomediastinal silhouette is limited due to right lower hemithorax opacification. Prosthetic aortic valve and CABG changes noted. No acute osseous abnormalities. The soft tissues are within normal limits. IMPRESSION: *Interval increase in the right pleural effusion. There are probable superimposed atelectatic changes in the right lung, which may be due to mucous plugging. Electronically Signed   By: Ree Molt M.D.   On: 2024-01-27 10:02   IR THORACENTESIS ASP PLEURAL SPACE W/IMG GUIDE Result Date: 01/04/2024 INDICATION: 83 year old male with history of heart failure, with recurrent right-sided pleural effusion. IR was requested for diagnostic and therapeutic thoracentesis. EXAM: ULTRASOUND GUIDED DIAGNOSTIC AND THERAPEUTIC THORACENTESIS MEDICATIONS: 6 cc of 1% lidocaine  with epi. COMPLICATIONS:  None immediate. PROCEDURE: An ultrasound guided thoracentesis was thoroughly discussed with the patient and questions answered. The benefits, risks, alternatives and complications were also discussed. The patient understands and wishes to proceed with the procedure. Written consent was obtained. Ultrasound was performed to localize and mark an adequate pocket of fluid in the right chest. The area was then prepped and draped in the normal sterile fashion. 1% Lidocaine  was used for local anesthesia. Under ultrasound guidance a 6 Fr Safe-T-Centesis catheter was introduced. Thoracentesis was performed. The catheter was removed and a dressing applied. FINDINGS: A total of approximately 2.0 L of clear, blood-laden pleural fluid was removed. Samples were sent to the laboratory as requested by the clinical team. IMPRESSION: Successful ultrasound guided right thoracentesis yielding 2.0 of pleural fluid. Procedure performed by Carlin Griffon, PA-C Electronically Signed   By: Ester Sides M.D.   On: 01/04/2024 11:36   DG Chest 1 View Result Date: 01/04/2024 CLINICAL DATA:  Post thoracentesis. EXAM: CHEST  1 VIEW COMPARISON:  Radiographs 01/03/2024 and 11/29/2023. FINDINGS: 0926 hours. Interval decreased right pleural effusion following thoracentesis. There is a small residual right pleural effusion with mild residual right basilar atelectasis. No pneumothorax. There is stable vascular congestion with mild atelectasis at the left lung base. The heart size and mediastinal contours are stable post median sternotomy and probable aortic valve replacement. No acute osseous findings are seen. IMPRESSION: Interval decreased right pleural effusion following thoracentesis. No pneumothorax. Electronically Signed   By: Elsie Perone M.D.   On: 01/04/2024 10:11   CT HEAD WO CONTRAST ( ) Result Date: 01/04/2024 EXAM: CT HEAD WITHOUT 01/04/2024 06:37:13 AM TECHNIQUE: CT of the head was performed without the administration of  intravenous contrast. Automated exposure control, iterative reconstruction, and/or weight based adjustment of the mA/kV was utilized to reduce the radiation dose to as low  as reasonably achievable. COMPARISON: CT head without contrast 10/12/2023 and 09/25/2023. CLINICAL HISTORY: Delirium. FINDINGS: BRAIN AND VENTRICLES: No acute intracranial hemorrhage. No mass effect or midline shift. No extra-axial fluid collection. Gray-white differentiation is maintained. No hydrocephalus. Periventricular and subcortical white matter hypoattenuation is mildly advanced for age. Mild diffuse atrophy is stable. ORBITS: No acute abnormality. SINUSES AND MASTOIDS: No acute abnormality. SOFT TISSUES AND SKULL: No acute skull fracture. No acute soft tissue abnormality. VASCULATURE: Atherosclerotic calcifications are present in the cavernous carotid arteries bilaterally and at the dural margin of both vertebral arteries. No hyperdense vessel is present. IMPRESSION: 1. No acute intracranial abnormality. 2. Mild diffuse atrophy, stable. 3. Mildly advanced periventricular and subcortical white matter hypoattenuation for age, stable. Electronically signed by: Lonni Necessary MD 01/04/2024 06:44 AM EDT RP Workstation: [id number]   DG Chest Port 1 View Result Date: 01/03/2024 CLINICAL DATA:  Shortness of breath. EXAM: PORTABLE CHEST 1 VIEW COMPARISON:  11/29/2023 FINDINGS: Moderate to large right pleural effusion, passing the lower 2/3 of right hemithorax. Prior median sternotomy with prosthetic cardiac valve. Heart size is obscured by right pleural effusion. Vascular congestion. No pneumothorax. IMPRESSION: 1. Moderate to large right pleural effusion. 2. Vascular congestion. Electronically Signed   By: Andrea Gasman M.D.   On: 01/03/2024 23:44    Microbiology: Recent Results (from the past 240 hours)  Blood Culture (routine x 2)     Status: None (Preliminary result)   Collection Time: 01/03/24 11:46 PM   Specimen: BLOOD   Result Value Ref Range Status   Specimen Description BLOOD RIGHT ANTECUBITAL  Final   Special Requests   Final    BOTTLES DRAWN AEROBIC AND ANAEROBIC Blood Culture adequate volume   Culture   Final    NO GROWTH 4 DAYS Performed at [facility], [address], [place], [place] [address]    Report Status PENDING  Incomplete  Resp panel by RT-PCR (RSV, Flu A&B, Covid) Anterior Nasal Swab     Status: None   Collection Time: 01/04/24  1:20 AM   Specimen: Anterior Nasal Swab  Result Value Ref Range Status   SARS Coronavirus 2 by RT PCR NEGATIVE NEGATIVE Final   Influenza A by PCR NEGATIVE NEGATIVE Final   Influenza B by PCR NEGATIVE NEGATIVE Final    Comment: (NOTE) The Xpert Xpress SARS-CoV-2/FLU/RSV plus assay is intended as an aid in the diagnosis of influenza from Nasopharyngeal swab specimens and should not be used as a sole basis for treatment. Nasal washings and aspirates are unacceptable for Xpert Xpress SARS-CoV-2/FLU/RSV testing.  Fact Sheet for Patients: BloggerCourse.com  Fact Sheet for Healthcare Providers: SeriousBroker.it  This test is not yet approved or cleared by the [place]  FDA and has been authorized for detection and/or diagnosis of SARS-CoV-2 by FDA under an Emergency Use Authorization (EUA). This EUA will remain in effect (meaning this test can be used) for the duration of the COVID-19 declaration under Section 564(b)(1) of the Act, 21 U.S.C. section 360bbb-3(b)(1), unless the authorization is terminated or revoked.     Resp Syncytial Virus by PCR NEGATIVE NEGATIVE Final    Comment: (NOTE) Fact Sheet for Patients: BloggerCourse.com  Fact Sheet for Healthcare Providers: SeriousBroker.it  This test is not yet approved or cleared by the [place]  FDA and has been authorized for detection and/or diagnosis of SARS-CoV-2 by FDA under an  Emergency Use Authorization (EUA). This EUA will remain in effect (meaning this test can be used) for the duration of the COVID-19  declaration under Section 564(b)(1) of the Act, 21 U.S.C. section 360bbb-3(b)(1), unless the authorization is terminated or revoked.  Performed at [facility], [address], [place], [place] [address]   Respiratory (~20 pathogens) panel by PCR     Status: None   Collection Time: 01/04/24  1:20 AM   Specimen: Nasopharyngeal Swab; Respiratory  Result Value Ref Range Status   Adenovirus NOT DETECTED NOT DETECTED Final   Coronavirus 229E NOT DETECTED NOT DETECTED Final    Comment: (NOTE) The Coronavirus on the Respiratory Panel, DOES NOT test for the novel  Coronavirus (2019 nCoV)    Coronavirus HKU1 NOT DETECTED NOT DETECTED Final   Coronavirus NL63 NOT DETECTED NOT DETECTED Final   Coronavirus OC43 NOT DETECTED NOT DETECTED Final   Metapneumovirus NOT DETECTED NOT DETECTED Final   Rhinovirus / Enterovirus NOT DETECTED NOT DETECTED Final   Influenza A NOT DETECTED NOT DETECTED Final   Influenza B NOT DETECTED NOT DETECTED Final   Parainfluenza Virus 1 NOT DETECTED NOT DETECTED Final   Parainfluenza Virus 2 NOT DETECTED NOT DETECTED Final   Parainfluenza Virus 3 NOT DETECTED NOT DETECTED Final   Parainfluenza Virus 4 NOT DETECTED NOT DETECTED Final   Respiratory Syncytial Virus NOT DETECTED NOT DETECTED Final   Bordetella pertussis NOT DETECTED NOT DETECTED Final   Bordetella Parapertussis NOT DETECTED NOT DETECTED Final   Chlamydophila pneumoniae NOT DETECTED NOT DETECTED Final   Mycoplasma pneumoniae NOT DETECTED NOT DETECTED Final    Comment: Performed at [facility], [address], [place], [place] [address]  Blood Culture (routine x 2)     Status: None (Preliminary result)   Collection Time: 01/04/24  1:50 AM   Specimen: BLOOD RIGHT FOREARM  Result Value Ref Range Status   Specimen Description BLOOD RIGHT FOREARM  Final   Special  Requests   Final    BOTTLES DRAWN AEROBIC AND ANAEROBIC Blood Culture results may not be optimal due to an inadequate volume of blood received in culture bottles   Culture   Final    NO GROWTH 4 DAYS Performed at [facility], [address], [place], [place] [address]    Report Status PENDING  Incomplete  Body fluid culture w Gram Stain     Status: None   Collection Time: 01/04/24 10:07 AM   Specimen: Lung, Right; Pleural Fluid  Result Value Ref Range Status   Specimen Description PLEURAL  Final   Special Requests RIGHT LUNG  Final   Gram Stain   Final    RARE WBC PRESENT,BOTH PMN AND MONONUCLEAR NO ORGANISMS SEEN    Culture   Final    NO GROWTH 3 DAYS Performed at [facility], [address] [place], [place] [address]    Report Status 01/21/2024 FINAL  Final    Time spent: 0 minutes  Signed: Mennie LAMY, MD 2024/01/21

## 2024-01-31 NOTE — Progress Notes (Signed)
 Patient expired at 1835. Remaining morphine  87.31 ml in the bag and tubing wasted by two nurses, this nurse and Diplomatic Services operational officer. Verdel LOISE Shams, RN

## 2024-01-31 DEATH — deceased

## 2024-02-15 ENCOUNTER — Ambulatory Visit: Admitting: Cardiology
